# Patient Record
Sex: Female | Born: 1955 | Race: Black or African American | Hispanic: No | Marital: Single | Smoking: Never smoker
Health system: Southern US, Community
[De-identification: ages and names within clinical notes are randomized; demographics above are authoritative.]

## PROBLEM LIST (undated history)

## (undated) DIAGNOSIS — N289 Disorder of kidney and ureter, unspecified: Secondary | ICD-10-CM

## (undated) DIAGNOSIS — E119 Type 2 diabetes mellitus without complications: Secondary | ICD-10-CM

## (undated) DIAGNOSIS — I10 Essential (primary) hypertension: Secondary | ICD-10-CM

## (undated) DIAGNOSIS — F40240 Claustrophobia: Secondary | ICD-10-CM

## (undated) DIAGNOSIS — D509 Iron deficiency anemia, unspecified: Secondary | ICD-10-CM

## (undated) DIAGNOSIS — J45909 Unspecified asthma, uncomplicated: Secondary | ICD-10-CM

## (undated) DIAGNOSIS — D649 Anemia, unspecified: Secondary | ICD-10-CM

## (undated) DIAGNOSIS — D72819 Decreased white blood cell count, unspecified: Secondary | ICD-10-CM

## (undated) DIAGNOSIS — C801 Malignant (primary) neoplasm, unspecified: Secondary | ICD-10-CM

## (undated) DIAGNOSIS — R0600 Dyspnea, unspecified: Secondary | ICD-10-CM

## (undated) DIAGNOSIS — G4730 Sleep apnea, unspecified: Secondary | ICD-10-CM

## (undated) DIAGNOSIS — M1990 Unspecified osteoarthritis, unspecified site: Secondary | ICD-10-CM

## (undated) HISTORY — PX: ABDOMINAL HYSTERECTOMY: SHX81

## (undated) HISTORY — DX: Anemia, unspecified: D64.9

## (undated) HISTORY — PX: CARDIAC SURGERY: SHX584

## (undated) HISTORY — DX: Essential (primary) hypertension: I10

## (undated) HISTORY — DX: Decreased white blood cell count, unspecified: D72.819

## (undated) HISTORY — DX: Claustrophobia: F40.240

## (undated) HISTORY — DX: Malignant (primary) neoplasm, unspecified: C80.1

## (undated) HISTORY — PX: OTHER SURGICAL HISTORY: SHX169

## (undated) HISTORY — DX: Iron deficiency anemia, unspecified: D50.9

## (undated) HISTORY — DX: Unspecified osteoarthritis, unspecified site: M19.90

---

## 2009-03-05 ENCOUNTER — Encounter: Payer: Self-pay | Admitting: Gastroenterology

## 2009-03-10 ENCOUNTER — Encounter: Payer: Self-pay | Admitting: Gastroenterology

## 2009-03-26 ENCOUNTER — Ambulatory Visit (HOSPITAL_COMMUNITY): Admission: RE | Admit: 2009-03-26 | Discharge: 2009-03-26 | Payer: Self-pay | Admitting: Gastroenterology

## 2009-03-26 ENCOUNTER — Ambulatory Visit: Payer: Self-pay | Admitting: Gastroenterology

## 2009-03-26 HISTORY — PX: COLONOSCOPY: SHX174

## 2010-03-08 NOTE — Letter (Signed)
Summary: Internal Other /triage/instructions  Internal Other /triage/instructions   Imported By: Cloria Spring LPN 54/10/8117 14:78:29  _____________________________________________________________________  External Attachment:    Type:   Image     Comment:   External Document

## 2010-03-08 NOTE — Letter (Signed)
Summary: TRIAGE  TRIAGE   Imported By: Diana Eves 03/05/2009 11:27:18  _____________________________________________________________________  External Attachment:    Type:   Image     Comment:   External Document  Appended Document: TRIAGE Osmoprep per pt request. Pt needs to take 1/2 her HCTZ and drink fluids so her urine remains light yellow.  Appended Document: TRIAGE Pt's Rx and instructions mailed.  Appended Document: TRIAGE Reviewed instructions with pt.

## 2012-08-06 ENCOUNTER — Ambulatory Visit: Payer: Self-pay

## 2013-11-18 ENCOUNTER — Ambulatory Visit (INDEPENDENT_AMBULATORY_CARE_PROVIDER_SITE_OTHER): Payer: Self-pay | Admitting: Orthopedic Surgery

## 2013-11-18 ENCOUNTER — Encounter: Payer: Self-pay | Admitting: Orthopedic Surgery

## 2013-11-18 VITALS — BP 119/72 | Ht 63.0 in | Wt 326.0 lb

## 2013-11-18 DIAGNOSIS — M170 Bilateral primary osteoarthritis of knee: Secondary | ICD-10-CM

## 2013-11-18 NOTE — Progress Notes (Signed)
BP 119/72  Ht 5\' 3"  (1.6 m)  Wt 326 lb (147.873 kg)  BMI 57.76 kg/m2

## 2013-11-18 NOTE — Progress Notes (Signed)
Chief Complaint  Patient presents with  . Knee Pain    bilateral knee pain left>right, unknown injury, referred by A. Mancel Bale, [place]    Pharmacy [employer] knee and seasonal Position Asst. Karna Dupes  58 year old female presents with bilateral knee pain with no history of injury. Complains of pain stiffness and difficulty walking. She complains of 8/10 pain which was unrelieved by Tylenol and ibuprofen and unrelieved by tramadol. Recently switched to [place] with a little bit better pain relief. The patient is not tried weight loss, exercise or physical therapy.  Her review of systems is negative except for joint pain and stiff joints. All systems were reviewed.  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 Orientation is normal mood and affect are normal gait is abnormal, she has a decreased stride length.  Mood and affect are normal.  Her upper extremities are well aligned with normal range of motion stability strength skin pulse and sensation  As far as her knees go she has limited flexion to just 90 she has full extension with crepitance there is joint line tenderness bilaterally in any joint line. The knees appear to be stable strength is normal scans intact pulses are normal she has mild edema she has normal sensation there is also no pathologic reflexes and her balance is normal.  She brought x-rays with her they showed grade 4 arthritis with varus osteophyte secondary bone changes from arthritis  She would need knee replacements but her BMI needs to be 40 or less  Our recommendations are for  aggressive weight loss measures to include medication. When her BMI is 40 or less please send her back for consideration for knee replacement

## 2013-11-18 NOTE — Patient Instructions (Signed)
Dr Aline Brochure will send recommendation to Mr Alford Highland

## 2014-08-03 ENCOUNTER — Encounter (HOSPITAL_COMMUNITY): Payer: Self-pay | Admitting: Oncology

## 2014-08-03 ENCOUNTER — Encounter (HOSPITAL_BASED_OUTPATIENT_CLINIC_OR_DEPARTMENT_OTHER): Payer: 59

## 2014-08-03 ENCOUNTER — Encounter (HOSPITAL_COMMUNITY): Payer: 59 | Attending: Oncology | Admitting: Oncology

## 2014-08-03 VITALS — BP 180/81 | HR 87 | Temp 98.0°F | Resp 18 | Ht 60.0 in | Wt 339.0 lb

## 2014-08-03 DIAGNOSIS — D649 Anemia, unspecified: Secondary | ICD-10-CM

## 2014-08-03 DIAGNOSIS — D72819 Decreased white blood cell count, unspecified: Secondary | ICD-10-CM | POA: Diagnosis not present

## 2014-08-03 DIAGNOSIS — D509 Iron deficiency anemia, unspecified: Secondary | ICD-10-CM

## 2014-08-03 HISTORY — DX: Iron deficiency anemia, unspecified: D50.9

## 2014-08-03 HISTORY — DX: Decreased white blood cell count, unspecified: D72.819

## 2014-08-03 LAB — CBC WITH DIFFERENTIAL/PLATELET
BASOS ABS: 0 10*3/uL (ref 0.0–0.1)
BASOS PCT: 0 % (ref 0–1)
Eosinophils Absolute: 0.1 10*3/uL (ref 0.0–0.7)
Eosinophils Relative: 2 % (ref 0–5)
HEMATOCRIT: 29.3 % — AB (ref 36.0–46.0)
Hemoglobin: 8.9 g/dL — ABNORMAL LOW (ref 12.0–15.0)
LYMPHS PCT: 44 % (ref 12–46)
Lymphs Abs: 1.7 10*3/uL (ref 0.7–4.0)
MCH: 24.5 pg — ABNORMAL LOW (ref 26.0–34.0)
MCHC: 30.4 g/dL (ref 30.0–36.0)
MCV: 80.7 fL (ref 78.0–100.0)
MONO ABS: 0.2 10*3/uL (ref 0.1–1.0)
MONOS PCT: 5 % (ref 3–12)
NEUTROS ABS: 1.9 10*3/uL (ref 1.7–7.7)
NEUTROS PCT: 49 % (ref 43–77)
Platelets: 217 10*3/uL (ref 150–400)
RBC: 3.63 MIL/uL — AB (ref 3.87–5.11)
RDW: 19.4 % — AB (ref 11.5–15.5)
WBC: 3.9 10*3/uL — AB (ref 4.0–10.5)

## 2014-08-03 LAB — COMPREHENSIVE METABOLIC PANEL
ALT: 14 U/L (ref 14–54)
ANION GAP: 7 (ref 5–15)
AST: 20 U/L (ref 15–41)
Albumin: 3.7 g/dL (ref 3.5–5.0)
Alkaline Phosphatase: 56 U/L (ref 38–126)
BUN: 20 mg/dL (ref 6–20)
CALCIUM: 9.1 mg/dL (ref 8.9–10.3)
CO2: 27 mmol/L (ref 22–32)
CREATININE: 0.88 mg/dL (ref 0.44–1.00)
Chloride: 101 mmol/L (ref 101–111)
Glucose, Bld: 102 mg/dL — ABNORMAL HIGH (ref 65–99)
Potassium: 3.4 mmol/L — ABNORMAL LOW (ref 3.5–5.1)
Sodium: 135 mmol/L (ref 135–145)
Total Bilirubin: 0.4 mg/dL (ref 0.3–1.2)
Total Protein: 9.5 g/dL — ABNORMAL HIGH (ref 6.5–8.1)

## 2014-08-03 LAB — SAMPLE TO BLOOD BANK

## 2014-08-03 LAB — IRON AND TIBC
IRON: 81 ug/dL (ref 28–170)
Saturation Ratios: 31 % (ref 10.4–31.8)
TIBC: 260 ug/dL (ref 250–450)
UIBC: 179 ug/dL

## 2014-08-03 LAB — RETICULOCYTES
RBC.: 3.63 MIL/uL — AB (ref 3.87–5.11)
RETIC COUNT ABSOLUTE: 29 10*3/uL (ref 19.0–186.0)
Retic Ct Pct: 0.8 % (ref 0.4–3.1)

## 2014-08-03 LAB — FERRITIN: FERRITIN: 179 ng/mL (ref 11–307)

## 2014-08-03 LAB — FOLATE: Folate: 15.6 ng/mL (ref >5.9)

## 2014-08-03 LAB — C-REACTIVE PROTEIN: CRP: 0.9 mg/dL (ref <1.0)

## 2014-08-03 LAB — LACTATE DEHYDROGENASE: LDH: 171 U/L (ref 98–192)

## 2014-08-03 LAB — SEDIMENTATION RATE: SED RATE: 138 mm/h — AB (ref 0–22)

## 2014-08-03 LAB — VITAMIN B12: Vitamin B-12: 300 pg/mL (ref 180–914)

## 2014-08-03 NOTE — Progress Notes (Signed)
Kelli Hurley presented for labwork. Labs per MD order drawn via Peripheral Line 23 gauge needle inserted in right AC.  Good blood return present. Procedure without incident.  Needle removed intact. Patient tolerated procedure well.

## 2014-08-03 NOTE — Assessment & Plan Note (Signed)
Significant anemia, in the 8 g/dL range.  Normocytic, hypochromic in nature.  Labs today: CBC diff, CMET, Stool cards x 3, LDH, Haptoglobin, Retic count, MM Panel, ESR, CRP, UPEP, anemia panel.  Return in 2 weeks for follow-up.  If peripheral work-up is negative, then she will need a bone marrow aspiration and biopsy.

## 2014-08-03 NOTE — Assessment & Plan Note (Signed)
Noted.  CBC diff drawn today.  May be hereditary, but requires work-up with anemia.

## 2014-08-03 NOTE — Progress Notes (Signed)
[facility] Hematology/Oncology Consultation   Name: Kelli Hurley      MRN: [id number]    Location: Room/bed info not found  Date: 08/03/2014 Time:3:23 PM   REFERRING PHYSICIAN:  Dr. Charlotte Sanes  REASON FOR CONSULT:  Anemia, leukopenia, and cryglobulinemia   DIAGNOSIS:  Normocytic (with episodes of microcytic), hypochromic anemia, with minimal leukopenia.  HISTORY OF PRESENT ILLNESS:   [name] is a 59 year old black American woman with a past medical history significant for HTN and obesity who was referred to [facility] for anemia, leukopenia, and cryglobulinemia.  I personally reviewed and went over laboratory results with the patient.  The results are noted within this dictation.  No labs available in [facility].  On review she is noted to have a normocytic, hypochromic anemia in the 8 g/dL range, a minimally low leukopenia, negative stool cards, Folate 11.8, B12 436, low TIBC, ferritin 269, WNL renal function.  No images are available via [facility].  She reports that she is up-to-date on mammograms which are done locally via a mobile testing site.  She reports that she is up-to-date on colonoscopy too, but I do not have this record.   The patient reports that she reported to her primary care provider to "get my knees checked out."  She reports B/L knee pain.  Labs were done at that time demonstrating an anemia.  She reports that her primary care provider started her on TID dosing of iron tablets.  She is tolerating well and she notes an improvement in her clinical wellbeing.    She denies any fatigue, blood in stool, dark stools, hematuria, headaches, dizziness, B symptoms, unintentional weight loss, decreased appetite, signs/symptoms of jaundice, dx of hepatitis, EtOH abuse, cigarette abuse, chest pain, abdominal pain.   PAST MEDICAL HISTORY:   Past Medical History  Diagnosis Date  . Hypertension   . Anemia     ALLERGIES: No Known Allergies    MEDICATIONS: I  have reviewed the patient's current medications.    Current Outpatient Prescriptions on File Prior to Visit  Medication Sig Dispense Refill  . HYDROCHLOROTHIAZIDE PO Take 25 mg by mouth daily.      No current facility-administered medications on file prior to visit.     PAST SURGICAL HISTORY Past Surgical History  Procedure Laterality Date  . Abdominal hysterectomy      FAMILY HISTORY: Family History  Problem Relation Age of Onset  . Cancer Mother   . Hypertension Mother   . Cancer Father   . Hypertension Father   . Cancer Maternal Grandmother     SOCIAL HISTORY: She reports that she used to smoke as a teenager, but quit > 40 years ago.  She denies EtOH abuse.  She denies illicit drug abuse.  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  She live byherself.  She is divorced with two daughters ages 64 and 1 who are healthy.  Her grandbaby is healthy too.  She is Psychologist, forensic.  PERFORMANCE STATUS: The patient's performance status is 1 - Symptomatic but completely ambulatory  PHYSICAL EXAM: Most Recent Vital Signs: Blood pressure 180/81, pulse 87, temperature 98 F (36.7 C), temperature source Oral, resp. rate 18, height 5' (1.524 m), weight 339 lb (153.769 kg), SpO2 100 %. General appearance: alert, cooperative, appears stated age, no distress and morbidly obese Head: Normocephalic, without obvious abnormality, atraumatic Eyes: conjunctivae/corneas clear.  PERRL, EOM's intact. Fundi benign. Throat: lips, mucosa, and tongue normal; teeth and gums normal Neck: no adenopathy, supple, symmetrical, trachea midline and thyroid not enlarged, symmetric, no tenderness/mass/nodules Back: symmetric, no curvature. ROM normal. No CVA tenderness. Lungs: clear to auscultation bilaterally and normal percussion bilaterally Heart: regular rate and rhythm, S1, S2 normal, no murmur, click, rub or gallop Abdomen: soft, non-tender; bowel  sounds normal; no masses,  no organomegaly Extremities: extremities normal, atraumatic, no cyanosis or edema Skin: Skin color, texture, turgor normal. No rashes or lesions Lymph nodes: Cervical, supraclavicular, and axillary nodes normal. Neurologic: Alert and oriented X 3, normal strength and tone. Normal symmetric reflexes. Normal coordination and gait  LABORATORY DATA:  No results found for this or any previous visit (from the past 48 hour(s)).    RADIOGRAPHY: No results found.     PATHOLOGY:  None   ASSESSMENT/PLAN:   Normocytic hypochromic anemia Significant anemia, in the 8 g/dL range.  Normocytic, hypochromic in nature.  Labs today: CBC diff, CMET, Stool cards x 3, LDH, Haptoglobin, Retic count, MM Panel, ESR, CRP, UPEP, anemia panel.  Return in 2 weeks for follow-up.  If peripheral work-up is negative, then she will need a bone marrow aspiration and biopsy.  Leukopenia Noted.  CBC diff drawn today.  May be hereditary, but requires work-up with anemia.   All questions were answered. The patient knows to call the clinic with any problems, questions or concerns. We can certainly see the patient much sooner if necessary.  Patient and plan discussed with Dr. Ancil Linsey and she is in agreement with the aforementioned.   This note is electronically signed by: Doy Mince  08/03/2014 3:23 PM    Patient is seen and examined, I agree with the details as noted above. She presents with a  normocytic anemia and mild leukopenia. She states she has had a GI evaluation and we will try to obtain records regarding this. We discussed options on how to proceed with evaluation of her anemia including a bone marrow biopsy, she would like to proceed first with peripheral evaluation today. Laboratory studies will be drawn as detailed above in addition we will also provide her with stool cards 3. Advised her if her peripheral evaluation is noncontributory I would strongly urge her  to consider bone marrow biopsy.  He does not have underlying chronic kidney disease, nor does she have any known rheumatologic disease. She is post menopausal. I spent time discussing with her the potential causes of anemia and the importance of additional valuation to which she is agreeable. We will see her back within the next week or 2 after laboratory studies are obtained to review the results and make additional recommendations. Molli Hazard, MD

## 2014-08-03 NOTE — Patient Instructions (Addendum)
[place] at [facility] Discharge Instructions  RECOMMENDATIONS MADE BY THE CONSULTANT AND ANY TEST RESULTS WILL BE SENT TO YOUR REFERRING PHYSICIAN.  Exam and discussion today with Kirby Crigler, PA and [name]. Return in 2 weeks for follow-up visit.  Thank you for choosing [name] at [facility] to provide your oncology and hematology care.  To afford each patient quality time with our provider, please arrive at least 15 minutes before your scheduled appointment time.    You need to re-schedule your appointment should you arrive 10 or more minutes late.  We strive to give you quality time with our providers, and arriving late affects you and other patients whose appointments are after yours.  Also, if you no show three or more times for appointments you may be dismissed from the clinic at the providers discretion.     Again, thank you for choosing [facility].  Our hope is that these requests will decrease the amount of time that you wait before being seen by our physicians.       _____________________________________________________________  Should you have questions after your visit to [facility], please contact our office at [phone] between the hours of 8:30 a.m. and 4:30 p.m.  Voicemails left after 4:30 p.m. will not be returned until the following business day.  For prescription refill requests, have your pharmacy contact our office.   24-Hour Urine Collection HOME CARE  When you get up in the morning on the day you do this test, pee (urinate) in the toilet and flush. Make a note of the time. This will be your start time on the day of collection and the end time on the next morning.  From then on, save all your pee (urine) in the plastic jug that was given to you.  You should stop collecting your pee 24 hours after you started.  If the plastic jug that is given to you already has liquid in it, that  is okay. Do not throw out the liquid or rinse out the jug. Some tests need the liquid to be added to your pee.  Keep your plastic jug cool (in an ice chest or the refrigerator) during the test.  When the 24 hours is over, bring your plastic jug to the clinic lab. Keep the jug cool (in an ice chest) while you are bringing it to the lab. Document Released: 04/21/2008 Document Revised: 04/17/2011 Document Reviewed: 04/21/2008 [facility] Patient Information 2015 [place], [place]. This information is not intended to replace advice given to you by your health care provider. Make sure you discuss any questions you have with your health care provider. Fecal Occult Blood Test This is a test done on a stool specimen to screen for gastrointestinal bleeding, which may be an indicator of colon cancer Is is usually done as part of a routine examination, annually, after age 37 or as directed by your caregiver. The fecal occult blood test (FOBT) checks for blood in your stool. Normally, there will not be enough blood lost through the gastrointestinal tract to turn an FOBT positive or for you to notice it visually in the form of bloody or dark, tarry stools. Any significant amount of blood being passed should be investigated.  A positive FOBT will tell your caregiver that you have bleeding occurring somewhere in your gastrointestinal tract. This blood loss could be due to ulcers, diverticulosis, bleeding polyps, inflammatory bowel disease, hemorrhoids, from swallowed  blood due to bleeding gums or nosebleeds, or it could be due to benign or cancerous tumors. Anything that protrudes into the lumen (the empty space in the intestine), like a polyp or tumor, and is rubbed against by the fecal waste as it passes through has the potential to eventually bleed intermittently. Often this small amount of blood is the first, and sometimes the only, symptom of early colon cancer, making the FOBT a valuable screening tool. PREPARATION FOR  TEST  You should not eat red meat within three days before testing. Other substances that could cause a false positive test result include fish, turnips, horseradish, and drugs such as colchicines and oxidizing drugs (for example, iodine and boric acid). Be sure to carefully follow your caregiver's instructions. With FOBT, your caregiver or laboratory will give you one or more test "cards." You collect a separate sample from three different stools, usually on consecutive days. Each stool sample should be collected into a clean container and should not be contaminated with urine or water. The slide is labeled with your name and the date; then, with an applicator stick, you apply a thin smear of stool onto each filter paper square/window contained on the card. Allow the filter paper to dry. Once it is dry, it is stable. Usually you will collect all of the consecutive samples, and then return all of them to your caregiver or laboratory at the same time, sometimes by mailing them. There are also over the counter tests which are dropped in your toilet. NORMAL FINDINGS   No occult blood within the stool.  The FOBT test is normally negative. A positive indicates either blood in the stool or an interfering substance. Multiple samples are done to: 1) catch intermittent bleeding; and 2) help rule out false positives. Ranges for normal findings may vary among different laboratories and hospitals. You should always check with your doctor after having lab work or other tests done to discuss the meaning of your test results and whether your values are considered within normal limits. MEANING OF TEST  Your caregiver will go over the test results with you and discuss the importance and meaning of your results, as well as treatment options and the need for additional tests if necessary. OBTAINING THE TEST RESULTS  It is your responsibility to obtain your test results. Ask the lab or department performing the test when  and how you will get your results. Document Released: 02/18/2004 Document Revised: 04/17/2011 Document Reviewed: 01/03/2008 [facility] Patient Information 2015 [place], [place]. This information is not intended to replace advice given to you by your health care provider. Make sure you discuss any questions you have with your health care provider.

## 2014-08-04 DIAGNOSIS — D509 Iron deficiency anemia, unspecified: Secondary | ICD-10-CM | POA: Diagnosis not present

## 2014-08-04 LAB — KAPPA/LAMBDA LIGHT CHAINS
KAPPA, LAMDA LIGHT CHAIN RATIO: 9.06 — AB (ref 0.26–1.65)
Kappa free light chain: 104.6 mg/L — ABNORMAL HIGH (ref 3.30–19.40)
LAMDA FREE LIGHT CHAINS: 11.54 mg/L (ref 5.71–26.30)

## 2014-08-04 LAB — MULTIPLE MYELOMA PANEL, SERUM
ALBUMIN/GLOB SERPL: 0.8 (ref 0.7–1.7)
ALPHA2 GLOB SERPL ELPH-MCNC: 0.6 g/dL (ref 0.4–1.0)
Albumin SerPl Elph-Mcnc: 3.7 g/dL (ref 2.9–4.4)
Alpha 1: 0.2 g/dL (ref 0.0–0.4)
B-GLOBULIN SERPL ELPH-MCNC: 1 g/dL (ref 0.7–1.3)
GLOBULIN, TOTAL: 4.9 g/dL — AB (ref 2.2–3.9)
Gamma Glob SerPl Elph-Mcnc: 3 g/dL — ABNORMAL HIGH (ref 0.4–1.8)
IgA: 38 mg/dL — ABNORMAL LOW (ref 87–352)
IgG (Immunoglobin G), Serum: 4157 mg/dL — ABNORMAL HIGH (ref 700–1600)
IgM, Serum: 40 mg/dL (ref 26–217)
M Protein SerPl Elph-Mcnc: 2.7 g/dL — ABNORMAL HIGH
TOTAL PROTEIN ELP: 8.6 g/dL — AB (ref 6.0–8.5)

## 2014-08-04 LAB — ERYTHROPOIETIN: ERYTHROPOIETIN: 246.4 m[IU]/mL — AB (ref 2.6–18.5)

## 2014-08-04 LAB — HAPTOGLOBIN: HAPTOGLOBIN: 98 mg/dL (ref 34–200)

## 2014-08-05 ENCOUNTER — Encounter (HOSPITAL_COMMUNITY): Payer: 59

## 2014-08-05 ENCOUNTER — Other Ambulatory Visit (HOSPITAL_COMMUNITY): Payer: Self-pay | Admitting: Oncology

## 2014-08-05 DIAGNOSIS — D649 Anemia, unspecified: Secondary | ICD-10-CM

## 2014-08-05 DIAGNOSIS — D72819 Decreased white blood cell count, unspecified: Secondary | ICD-10-CM

## 2014-08-05 DIAGNOSIS — D509 Iron deficiency anemia, unspecified: Secondary | ICD-10-CM

## 2014-08-06 NOTE — Progress Notes (Signed)
24 HOUR URINE DROPPED OFF, OCCULT CARDS NOT LABELED

## 2014-08-07 ENCOUNTER — Ambulatory Visit (HOSPITAL_COMMUNITY)
Admission: RE | Admit: 2014-08-07 | Discharge: 2014-08-07 | Disposition: A | Discharge status: Home / Self Care | Payer: 59 | Source: Ambulatory Visit | Attending: Oncology | Admitting: Oncology

## 2014-08-07 DIAGNOSIS — D72819 Decreased white blood cell count, unspecified: Secondary | ICD-10-CM | POA: Insufficient documentation

## 2014-08-07 DIAGNOSIS — D509 Iron deficiency anemia, unspecified: Secondary | ICD-10-CM | POA: Diagnosis present

## 2014-08-13 LAB — UIFE/LIGHT CHAINS/TP QN, 24-HR UR
% BETA, Urine: 0 %
ALBUMIN, U: 100 %
ALPHA 1 URINE: 0 %
ALPHA 2 UR: 0 %
FREE KAPPA/LAMBDA RATIO: 5.11 (ref 2.04–10.37)
FREE LAMBDA LT CHAINS, UR: 1.24 mg/L (ref 0.24–6.66)
Free Lt Chn Excr Rate: 6.34 mg/L (ref 1.35–24.19)
GAMMA GLOBULIN URINE: 0 %
TIME-UPE24: 24 h
TOTAL PROTEIN, URINE-UR/DAY: 265.7 mg/(24.h) — AB (ref 30.0–150.0)
Total Protein, Urine: 16.1 mg/dL
VOLUME, URINE-UPE24: 1650 mL

## 2014-08-18 ENCOUNTER — Encounter (HOSPITAL_COMMUNITY): Payer: Self-pay | Admitting: Oncology

## 2014-08-18 ENCOUNTER — Encounter (HOSPITAL_COMMUNITY): Payer: 59 | Attending: Oncology | Admitting: Oncology

## 2014-08-18 VITALS — BP 131/102 | HR 93 | Temp 98.6°F | Resp 18 | Wt 337.0 lb

## 2014-08-18 DIAGNOSIS — D509 Iron deficiency anemia, unspecified: Secondary | ICD-10-CM | POA: Insufficient documentation

## 2014-08-18 DIAGNOSIS — D649 Anemia, unspecified: Secondary | ICD-10-CM | POA: Diagnosis not present

## 2014-08-18 DIAGNOSIS — D72819 Decreased white blood cell count, unspecified: Secondary | ICD-10-CM

## 2014-08-18 NOTE — Assessment & Plan Note (Addendum)
Work-up findings concerning for multiple myeloma.  Labs as noted above.  Radiographic findings as noted above.  She needs a bone marrow aspiration and biopsy.  I reviewed the procedure in detail.  I discussed the risks, benefits, alternatives, and side effects of this intervention.   She is agreeable to this test.  She is interested in having this performed in [place] with IR.  Return in 2-3 weeks for follow-up after BMBX.   Ferritin level is good most recently.  I will not refill PO iron at this time.

## 2014-08-18 NOTE — Progress Notes (Signed)
Kelli Curb, PA-C [address] [place] [address]  Normocytic hypochromic anemia - Plan: CT Biopsy  CURRENT THERAPY: Work-up for anemia  INTERVAL HISTORY: Kelli Hurley 59 y.o. female returns for followup of anemia and leukopenia.  I personally reviewed and went over laboratory results with the patient.  The results are noted within this dictation.  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  I personally reviewed and went over radiographic studies with the patient.  The results are noted within this dictation.  Skeletal survey is negative for any lytic lesions.  With this information, the patient needs a bone marrow aspiration and biopsy.  Past Medical History  Diagnosis Date  . Hypertension   . Anemia   . Normocytic hypochromic anemia 08/03/2014  . Leukopenia 08/03/2014    has Primary osteoarthritis of both knees; Normocytic hypochromic anemia; and Leukopenia on her problem list.     has No Known Allergies.  Current Outpatient Prescriptions on File Prior to Visit  Medication Sig Dispense Refill  . acetaminophen (TYLENOL) 325 MG tablet Take 650 mg by mouth every 6 (six) hours as needed.    . ferrous fumarate (HEMOCYTE - 106 MG FE) 325 (106 FE) MG TABS tablet Take 1 tablet by mouth 3 (three) times daily.    Marland Kitchen HYDROCHLOROTHIAZIDE PO Take 25 mg by mouth daily.      No current facility-administered medications on file prior to visit.    Past Surgical History  Procedure Laterality Date  . Abdominal hysterectomy      Denies any headaches, dizziness, double vision, fevers, chills, night sweats, nausea, vomiting, diarrhea, constipation, chest  pain, heart palpitations, shortness of breath, blood in stool, black tarry stool, urinary pain, urinary burning, urinary frequency, hematuria.   PHYSICAL EXAMINATION  ECOG PERFORMANCE STATUS: 1 - Symptomatic but completely ambulatory  Filed Vitals:   08/18/14 1401  BP: 131/102  Pulse: 93  Temp: 98.6 F (37 C)  Resp: 18    GENERAL:alert, no distress, well nourished, well developed, comfortable, cooperative, obese, smiling and accompanied by sister SKIN: skin color, texture, turgor are normal, no rashes or significant lesions HEAD: Normocephalic, No masses, lesions, tenderness or abnormalities EYES: normal, PERRLA, EOMI, Conjunctiva are pink and non-injected EARS: External ears normal OROPHARYNX:lips, buccal mucosa, and tongue normal and mucous membranes are moist  NECK: supple, trachea midline LYMPH:  not examined BREAST:not examined LUNGS: not examined HEART: not examined ABDOMEN:not examined BACK: Back symmetric, no curvature. EXTREMITIES:less then 2 second capillary refill, no joint deformities, effusion, or inflammation, no skin discoloration, no cyanosis  NEURO: alert & oriented x 3 with fluent speech, no focal motor/sensory deficits, gait normal   LABORATORY DATA: CBC    Component Value Date/Time   WBC 3.9* 08/03/2014 1546   RBC 3.63* 08/03/2014 1546   RBC 3.63* 08/03/2014 1546   HGB 8.9* 08/03/2014 1546   HCT 29.3* 08/03/2014 1546   PLT 217 08/03/2014 1546   MCV 80.7 08/03/2014 1546   MCH 24.5* 08/03/2014 1546   MCHC 30.4 08/03/2014 1546   RDW 19.4* 08/03/2014 1546   LYMPHSABS 1.7 08/03/2014 1546   MONOABS 0.2 08/03/2014 1546   EOSABS 0.1 08/03/2014 1546   BASOSABS 0.0  08/03/2014 1546      Chemistry      Component Value Date/Time   NA 135 08/03/2014 1546   K 3.4* 08/03/2014 1546   CL 101 08/03/2014 1546   CO2 27 08/03/2014 1546   BUN 20 08/03/2014 1546   CREATININE 0.88 08/03/2014 1546      Component Value Date/Time   CALCIUM 9.1 08/03/2014 1546    ALKPHOS 56 08/03/2014 1546   AST 20 08/03/2014 1546   ALT 14 08/03/2014 1546   BILITOT 0.4 08/03/2014 1546      Lab Results  Component Value Date   PROT 9.5* 08/03/2014   IGGSERUM 4157* 08/03/2014   IGA 38* 08/03/2014   IGMSERUM 40 08/03/2014   KPAFRELGTCHN 104.60* 08/03/2014   LAMBDASER 11.54 08/03/2014   KAPLAMBRATIO 9.06* 08/03/2014  ]  Lab Results  Component Value Date   LDH 171 08/03/2014  ] Lab Results  Component Value Date   IRON 81 08/03/2014   TIBC 260 08/03/2014   FERRITIN 179 08/03/2014   Lab Results  Component Value Date   VITAMINB12 300 08/03/2014   Lab Results  Component Value Date   FOLATE 15.6 08/03/2014   Lab Results  Component Value Date   CRP 0.9 08/03/2014   Lab Results  Component Value Date   ESRSEDRATE 138* 08/03/2014   Results for Kelli Hurley (MRN [id number]) as of 08/18/2014 15:33  Ref. Range 08/03/2014 15:46  Haptoglobin Latest Ref Range: 34-200 mg/dL 98   Results for Kelli Hurley (MRN [id number]) as of 08/18/2014 15:33  Ref. Range 08/03/2014 15:46  Erythropoietin Latest Ref Range: 2.6-18.5 mIU/mL 246.4 (H)   PENDING LABS:   RADIOGRAPHIC STUDIES:  Dg Bone Survey Met  08/07/2014   CLINICAL DATA:  Normocytic hypochromic anemia.  Leukopenia.  EXAM: METASTATIC BONE SURVEY  COMPARISON:  None.  FINDINGS: Multilevel degenerative disc disease is noted in the cervical and thoracic spine. Severe degenerative disc disease is noted in both knees. No areas of abnormal sclerosis or lytic destruction are seen in the visualized skeleton.  IMPRESSION: No lytic lesions are noted in the visualized skeleton.   Electronically Signed   By: Kelli Hurley, M.D.   On: 08/07/2014 12:33     PATHOLOGY:    ASSESSMENT AND PLAN:  Normocytic hypochromic anemia Work-up findings concerning for multiple myeloma.  Labs as noted above.  Radiographic findings as noted above.  She needs a bone marrow aspiration and biopsy.  I reviewed the procedure in  detail.  I discussed the risks, benefits, alternatives, and side effects of this intervention.   She is agreeable to this test.  She is interested in having this performed in [place] with IR.  Return in 2-3 weeks for follow-up after BMBX.   Ferritin level is good most recently.  I will not refill PO iron at this time.    THERAPY PLAN:  Bone marrow aspiration and biopsy by IR.  All questions were answered. The patient knows to call the clinic with any problems, questions or concerns. We can certainly see the patient much sooner if necessary.  Patient and plan discussed with Dr. Ancil Linsey and she is in agreement with the aforementioned.   This note is electronically signed by: Doy Mince 08/18/2014 3:37 PM

## 2014-08-18 NOTE — Patient Instructions (Signed)
[place] at [facility] Discharge Instructions  RECOMMENDATIONS MADE BY THE CONSULTANT AND ANY TEST RESULTS WILL BE SENT TO YOUR REFERRING PHYSICIAN.  Exam and discussion today with Kirby Crigler, PA-C. Bone marrow aspiration and biopsy in Interventional Radiology. Return in 2-3 weeks for office visit with [name].  Thank you for choosing [place] at [facility] to provide your oncology and hematology care.  To afford each patient quality time with our provider, please arrive at least 15 minutes before your scheduled appointment time.    You need to re-schedule your appointment should you arrive 10 or more minutes late.  We strive to give you quality time with our providers, and arriving late affects you and other patients whose appointments are after yours.  Also, if you no show three or more times for appointments you may be dismissed from the clinic at the providers discretion.     Again, thank you for choosing [facility].  Our hope is that these requests will decrease the amount of time that you wait before being seen by our physicians.       _____________________________________________________________  Should you have questions after your visit to [facility], please contact our office at [phone] between the hours of 8:30 a.m. and 4:30 p.m.  Voicemails left after 4:30 p.m. will not be returned until the following business day.  For prescription refill requests, have your pharmacy contact our office.

## 2014-08-19 ENCOUNTER — Other Ambulatory Visit: Payer: Self-pay | Admitting: Radiology

## 2014-08-21 ENCOUNTER — Ambulatory Visit (HOSPITAL_COMMUNITY): Payer: 59

## 2014-08-24 ENCOUNTER — Ambulatory Visit (HOSPITAL_COMMUNITY): Payer: 59

## 2014-08-24 ENCOUNTER — Other Ambulatory Visit (HOSPITAL_COMMUNITY): Payer: 59

## 2014-09-02 ENCOUNTER — Other Ambulatory Visit: Payer: Self-pay | Admitting: Radiology

## 2014-09-03 ENCOUNTER — Ambulatory Visit (HOSPITAL_COMMUNITY)
Admission: RE | Admit: 2014-09-03 | Discharge: 2014-09-03 | Disposition: A | Discharge status: Home / Self Care | Payer: 59 | Source: Ambulatory Visit | Attending: Oncology | Admitting: Oncology

## 2014-09-03 ENCOUNTER — Encounter (HOSPITAL_COMMUNITY): Payer: Self-pay

## 2014-09-03 ENCOUNTER — Other Ambulatory Visit: Payer: Self-pay | Admitting: Physician Assistant

## 2014-09-03 DIAGNOSIS — D649 Anemia, unspecified: Secondary | ICD-10-CM | POA: Diagnosis present

## 2014-09-03 DIAGNOSIS — D509 Iron deficiency anemia, unspecified: Secondary | ICD-10-CM

## 2014-09-03 LAB — CBC
HCT: 30.8 % — ABNORMAL LOW (ref 36.0–46.0)
Hemoglobin: 9.4 g/dL — ABNORMAL LOW (ref 12.0–15.0)
MCH: 24.4 pg — ABNORMAL LOW (ref 26.0–34.0)
MCHC: 30.5 g/dL (ref 30.0–36.0)
MCV: 79.8 fL (ref 78.0–100.0)
Platelets: 186 10*3/uL (ref 150–400)
RBC: 3.86 MIL/uL — AB (ref 3.87–5.11)
RDW: 19 % — ABNORMAL HIGH (ref 11.5–15.5)
WBC: 3.4 10*3/uL — AB (ref 4.0–10.5)

## 2014-09-03 LAB — PROTIME-INR
INR: 1.05 (ref 0.00–1.49)
Prothrombin Time: 13.9 seconds (ref 11.6–15.2)

## 2014-09-03 LAB — BONE MARROW EXAM

## 2014-09-03 LAB — APTT: aPTT: 22 seconds — ABNORMAL LOW (ref 24–37)

## 2014-09-03 MED ORDER — FENTANYL CITRATE (PF) 100 MCG/2ML IJ SOLN
INTRAMUSCULAR | Status: AC | PRN
  Administered 2014-09-03: 50 ug via INTRAVENOUS
  Administered 2014-09-03: 25 ug via INTRAVENOUS

## 2014-09-03 MED ORDER — FENTANYL CITRATE (PF) 100 MCG/2ML IJ SOLN
INTRAMUSCULAR | Status: AC
  Filled 2014-09-03: qty 4

## 2014-09-03 MED ORDER — SODIUM CHLORIDE 0.9 % IV SOLN
Freq: Once | INTRAVENOUS | Status: AC
  Administered 2014-09-03: 10:00:00 via INTRAVENOUS

## 2014-09-03 MED ORDER — MIDAZOLAM HCL 2 MG/2ML IJ SOLN
INTRAMUSCULAR | Status: AC | PRN
  Administered 2014-09-03 (×6): 1 mg via INTRAVENOUS

## 2014-09-03 MED ORDER — MIDAZOLAM HCL 2 MG/2ML IJ SOLN
INTRAMUSCULAR | Status: AC
  Filled 2014-09-03: qty 6

## 2014-09-03 MED ORDER — MIDAZOLAM HCL 2 MG/2ML IJ SOLN
INTRAMUSCULAR | Status: AC
  Filled 2014-09-03: qty 2

## 2014-09-03 NOTE — H&P (Signed)
Chief Complaint: Patient was seen in consultation today for CT guided bone marrow biopsy at the request of [place] S  Referring Physician(s): Kefalas,Thomas S  History of Present Illness: Kelli Hurley is a 59 y.o. female with history of anemia, leukopenia, and abnormal serum protein electrophoresis who presents today for CT guided bone marrow biopsy for further evaluation.   Past Medical History  Diagnosis Date  . Hypertension   . Anemia   . Normocytic hypochromic anemia 08/03/2014  . Leukopenia 08/03/2014    Past Surgical History  Procedure Laterality Date  . Abdominal hysterectomy    . 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  Medications: Prior to Admission medications   Medication Sig Start Date End Date Taking? Authorizing Provider  acetaminophen (TYLENOL) 325 MG tablet Take 650 mg by mouth every 6 (six) hours as needed for mild pain.    Yes Historical Provider, MD  hydrochlorothiazide (HYDRODIURIL) 25 MG tablet Take 25 mg by mouth every morning.   Yes Historical Provider, MD     Family History  Problem Relation Age of Onset  . Cancer Mother   . Hypertension Mother   . Cancer Father   . Hypertension Father   . Cancer Maternal Grandmother     History   Social History  . Marital Status: Legally Separated    Spouse Name: N/A  . Number of Children: N/A  . Years of Education: N/A   Social History Main Topics  . Smoking status: Never Smoker   . Smokeless tobacco: Not on file  . Alcohol Use: No  . Drug Use: No  . Sexual Activity: Not on file   Other Topics Concern  . None   Social History Narrative       Review of Systems  Constitutional: Negative for fever and chills.  Respiratory: Negative for cough.        Some dyspnea with exertion  Cardiovascular: Negative for chest pain.  Gastrointestinal: Negative for nausea,  vomiting, abdominal pain and blood in stool.  Genitourinary: Negative for dysuria and hematuria.  Musculoskeletal: Negative for back pain.  Neurological: Negative for headaches.    Vital Signs: BP 131/74 mmHg  Pulse 86  Temp(Src) 98.6 F (37 C) (Oral)  Resp 16  Ht 5' 2"  (1.575 m)  Wt 335 lb (151.955 kg)  BMI 61.26 kg/m2  SpO2 97%  Physical Exam  Constitutional: She is oriented to person, place, and time. She appears well-developed and well-nourished.  Cardiovascular: Normal rate and regular rhythm.   Pulmonary/Chest: Effort normal.  Sl dim BS bases  Abdominal: Soft. Bowel sounds are normal. There is no tenderness.  obese  Musculoskeletal: Normal range of motion.  Neurological: She is alert and oriented to person, place, and time.    Mallampati Score:     Imaging: Dg Bone Survey Met  08/07/2014   CLINICAL DATA:  Normocytic hypochromic anemia.  Leukopenia.  EXAM: METASTATIC BONE SURVEY  COMPARISON:  None.  FINDINGS: Multilevel degenerative disc disease is noted in the cervical and thoracic spine. Severe degenerative disc disease is noted in both knees. No areas of abnormal sclerosis or lytic destruction are seen in the visualized skeleton.  IMPRESSION: No lytic lesions are noted in the visualized skeleton.   Electronically Signed   By: Marijo Conception, M.D.   On: 08/07/2014 12:33    Labs:  CBC:  Recent Labs  08/03/14 1546  WBC 3.9*  HGB 8.9*  HCT 29.3*  PLT 217    COAGS: No results for input(s): INR, APTT in the last 8760 hours.  BMP:  Recent Labs  08/03/14 1546  NA 135  K 3.4*  CL 101  CO2 27  GLUCOSE 102*  BUN 20  CALCIUM 9.1  CREATININE 0.88  GFRNONAA >60  GFRAA >60    LIVER FUNCTION TESTS:  Recent Labs  08/03/14 1546  BILITOT 0.4  AST 20  ALT 14  ALKPHOS 56  PROT 9.5*  ALBUMIN 3.7    TUMOR MARKERS: No results for input(s): AFPTM, CEA, CA199, CHROMGRNA in the last 8760 hours.  Assessment and Plan: Kelli Hurley is a 59 y.o.  female with history of anemia, leukopenia, and abnormal serum protein electrophoresis who presents today for CT guided bone marrow biopsy for further evaluation.Risks and benefits discussed with the patient/family including, but not limited to bleeding, infection, damage to adjacent structures or low yield requiring additional tests. All of the patient's questions were answered, patient is agreeable to proceed.Consent signed and in chart.      Signed: D. Rowe Robert 09/03/2014, 9:37 AM   I spent a total of 15 minutes in face to face in clinical consultation, greater than 50% of which was counseling/coordinating care for CT guided bone marrow biopsy

## 2014-09-03 NOTE — Discharge Instructions (Signed)
Bone Marrow Aspiration, Bone Marrow Biopsy °Care After °Read the instructions outlined below and refer to this sheet in the next few weeks. These discharge instructions provide you with general information on caring for yourself after you leave the hospital. Your caregiver may also give you specific instructions. While your treatment has been planned according to the most current medical practices available, unavoidable complications occasionally occur. If you have any problems or questions after discharge, call your caregiver. °FINDING OUT THE RESULTS OF YOUR TEST °Not all test results are available during your visit. If your test results are not back during the visit, make an appointment with your caregiver to find out the results. Do not assume everything is normal if you have not heard from your caregiver or the medical facility. It is important for you to follow up on all of your test results.  °HOME CARE INSTRUCTIONS  °You have had sedation and may be sleepy or dizzy. Your thinking may not be as clear as usual. For the next 24 hours: °· Only take over-the-counter or prescription medicines for pain, discomfort, and or fever as directed by your caregiver. °· Do not drink alcohol. °· Do not smoke. °· Do not drive. °· Do not make important legal decisions. °· Do not operate heavy machinery. °· Do not care for small children by yourself. °· Keep your dressing clean and dry. You may replace dressing with a bandage after 24 hours. °· You may take a bath or shower after 24 hours. °· Use an ice pack for 20 minutes every 2 hours while awake for pain as needed. °SEEK MEDICAL CARE IF:  °· There is redness, swelling, or increasing pain at the biopsy site. °· There is pus coming from the biopsy site. °· There is drainage from a biopsy site lasting longer than one day. °· An unexplained oral temperature above 102° F (38.9° C) develops. °SEEK IMMEDIATE MEDICAL CARE IF:  °· You develop a rash. °· You have difficulty  breathing. °· You develop any reaction or side effects to medications given. °Document Released: 08/12/2004 Document Revised: 04/17/2011 Document Reviewed: 01/21/2008 °ExitCare® Patient Information ©2015 [employer]. This information is not intended to replace advice given to you by your health care provider. Make sure you discuss any questions you have with your health care provider. °Conscious Sedation °Sedation is the use of medicines to promote relaxation and relieve discomfort and anxiety. Conscious sedation is a type of sedation. Under conscious sedation you are less alert than normal but are still able to respond to instructions or stimulation. Conscious sedation is used during short medical and dental procedures. It is milder than deep sedation or general anesthesia and allows you to return to your regular activities sooner.  °LET YOUR HEALTH CARE PROVIDER KNOW ABOUT:  °· Any allergies you have. °· All medicines you are taking, including vitamins, herbs, eye drops, creams, and over-the-counter medicines. °· Use of steroids (by mouth or creams). °· Previous problems you or members of your family have had with the use of anesthetics. °· Any blood disorders you have. °· Previous surgeries you have had. °· Medical conditions you have. °· Possibility of pregnancy, if this applies. °· Use of cigarettes, alcohol, or illegal drugs. °RISKS AND COMPLICATIONS °Generally, this is a safe procedure. However, as with any procedure, problems can occur. Possible problems include: °· Oversedation. °· Trouble breathing on your own. You may need to have a breathing tube until you are awake and breathing on your own. °· Allergic reaction   to any of the medicines used for the procedure. °BEFORE THE PROCEDURE °· You may have blood tests done. These tests can help show how well your kidneys and liver are working. They can also show how well your blood clots. °· A physical exam will be done.   °· Only take medicines as directed by  your health care provider. You may need to stop taking medicines (such as blood thinners, aspirin, or nonsteroidal anti-inflammatory drugs) before the procedure.   °· Do not eat or drink at least 6 hours before the procedure or as directed by your health care provider. °· Arrange for a responsible adult, family member, or friend to take you home after the procedure. He or she should stay with you for at least 24 hours after the procedure, until the medicine has worn off. °PROCEDURE  °· An intravenous (IV) catheter will be inserted into one of your veins. Medicine will be able to flow directly into your body through this catheter. You may be given medicine through this tube to help prevent pain and help you relax. °· The medical or dental procedure will be done. °AFTER THE PROCEDURE °· You will stay in a recovery area until the medicine has worn off. Your blood pressure and pulse will be checked.   °·  Depending on the procedure you had, you may be allowed to go home when you can tolerate liquids and your pain is under control. °Document Released: 10/18/2000 Document Revised: 01/28/2013 Document Reviewed: 09/30/2012 °ExitCare® Patient Information ©2015 [employer]. This information is not intended to replace advice given to you by your health care provider. Make sure you discuss any questions you have with your health care provider. °Conscious Sedation, Adult, Care After °Refer to this sheet in the next few weeks. These instructions provide you with information on caring for yourself after your procedure. Your health care provider may also give you more specific instructions. Your treatment has been planned according to current medical practices, but problems sometimes occur. Call your health care provider if you have any problems or questions after your procedure. °WHAT TO EXPECT AFTER THE PROCEDURE  °After your procedure: °· You may feel sleepy, clumsy, and have poor balance for several hours. °· Vomiting may  occur if you eat too soon after the procedure. °HOME CARE INSTRUCTIONS °· Do not participate in any activities where you could become injured for at least 24 hours. Do not: °¨ Drive. °¨ Swim. °¨ Ride a bicycle. °¨ Operate heavy machinery. °¨ Cook. °¨ Use power tools. °¨ Climb ladders. °¨ Work from a high place. °· Do not make important decisions or sign legal documents until you are improved. °· If you vomit, drink water, juice, or soup when you can drink without vomiting. Make sure you have little or no nausea before eating solid foods. °· Only take over-the-counter or prescription medicines for pain, discomfort, or fever as directed by your health care provider. °· Make sure you and your family fully understand everything about the medicines given to you, including what side effects may occur. °· You should not drink alcohol, take sleeping pills, or take medicines that cause drowsiness for at least 24 hours. °· If you smoke, do not smoke without supervision. °· If you are feeling better, you may resume normal activities 24 hours after you were sedated. °· Keep all appointments with your health care provider. °SEEK MEDICAL CARE IF: °· Your skin is pale or bluish in color. °· You continue to feel nauseous or vomit. °· Your pain   is getting worse and is not helped by medicine. °· You have bleeding or swelling. °· You are still sleepy or feeling clumsy after 24 hours. °SEEK IMMEDIATE MEDICAL CARE IF: °· You develop a rash. °· You have difficulty breathing. °· You develop any type of allergic problem. °· You have a fever. °MAKE SURE YOU: °· Understand these instructions. °· Will watch your condition. °· Will get help right away if you are not doing well or get worse. °Document Released: 11/13/2012 Document Reviewed: 11/13/2012 °ExitCare® Patient Information ©2015 [employer]. This information is not intended to replace advice given to you by your health care provider. Make sure you discuss any questions you have with  your health care provider. ° °

## 2014-09-03 NOTE — Procedures (Signed)
Interventional Radiology Procedure Note  Procedure: CT guided aspirate and core biopsy of right iliac bone Complications: None Recommendations: - Bedrest supine x 1 hrs - OTC for any post op pain - Follow biopsy results  Signed,  Dulcy Fanny. Earleen Newport, DO

## 2014-09-08 ENCOUNTER — Ambulatory Visit (HOSPITAL_COMMUNITY): Payer: 59 | Admitting: Hematology & Oncology

## 2014-09-14 LAB — TISSUE HYBRIDIZATION (BONE MARROW)-NCBH

## 2014-09-14 LAB — CHROMOSOME ANALYSIS, BONE MARROW

## 2014-09-15 ENCOUNTER — Encounter (HOSPITAL_COMMUNITY): Payer: 59 | Attending: Oncology | Admitting: Hematology & Oncology

## 2014-09-15 VITALS — BP 132/65 | HR 81 | Temp 98.0°F | Resp 18 | Wt 339.6 lb

## 2014-09-15 DIAGNOSIS — C9000 Multiple myeloma not having achieved remission: Secondary | ICD-10-CM

## 2014-09-15 DIAGNOSIS — D509 Iron deficiency anemia, unspecified: Secondary | ICD-10-CM | POA: Insufficient documentation

## 2014-09-15 DIAGNOSIS — D72819 Decreased white blood cell count, unspecified: Secondary | ICD-10-CM | POA: Insufficient documentation

## 2014-09-15 NOTE — Patient Instructions (Addendum)
[place] at [facility] Discharge Instructions  RECOMMENDATIONS MADE BY THE CONSULTANT AND ANY TEST RESULTS WILL BE SENT TO YOUR REFERRING PHYSICIAN.  We will send you to [facility] to see a Multiple Myeloma specialist there also. We are doing this because you are young and eventually we want you to have an auto-bone marrow transplant (your own bone marrow). The transplant will have to be done at [facility] instead of [facility].  Your chemo treatments will be done here @ [place]. Treatments will last between 4-6 months.   If [facility] decides to do a transplant, you will stay in the hospital for 2-3 weeks (@ [facility]).   This cancer is more prevalent in the African American population.  The care for this cancer is very standardized.   You will start with induction therapy. Hildred Alamin will do your teaching. Kiaria Quinnell's # [id number]  After transplant, some patients take a pill. We will determine this once we get closer to that time.  MM cells live in the bones and can eat holes in the bone. We will order a PET scan to be sure that we don't see any cancer in the bones.   We will have you do a urine test. You will collect your urine for a 24 hour period and then bring it back to [place] and we will test your urine for a M-protein.  These plasma cells constantly make M proteins and this is what we are checking for in your urine.       Thank you for choosing [place] at [facility] to provide your oncology and hematology care.  To afford each patient quality time with our provider, please arrive at least 15 minutes before your scheduled appointment time.    You need to re-schedule your appointment should you arrive 10 or more minutes late.  We strive to give you quality time with our providers, and arriving late affects you and other patients whose appointments are after yours.  Also, if you no show three or more times for appointments you may be dismissed  from the clinic at the providers discretion.     Again, thank you for choosing [facility].  Our hope is that these requests will decrease the amount of time that you wait before being seen by our physicians.       _____________________________________________________________  Should you have questions after your visit to [facility], please contact our office at [phone] between the hours of 8:30 a.m. and 4:30 p.m.  Voicemails left after 4:30 p.m. will not be returned until the following business day.  For prescription refill requests, have your pharmacy contact our office.   Multiple Myeloma Multiple myeloma is the most common cancer of bone. It is caused by the uncontrolled multiplication of a type of white blood cell in the marrow. This white blood cell is called a plasma cell. This means the bone marrow is overworking producing plasma cells. Soon these overproduced cells begin to take up room in the marrow that is needed by other cells. This means that there are soon not enough red or white blood cells or platelets. Not enough red cells mean that the person is anemic. There are not enough red blood cells to carry oxygen around the body. There are not enough white blood cells to fight disease. This causes the person with multiple myeloma to not feel well. There is also bone pain through  much of the body. SYMPTOMS  Anemia causes fatigue (tiredness) and weakness.  Back pain is common. This is from fractures (break in bones) caused by damage to the bones of the back.  Lack of white blood cells makes infection more likely.  Bleeding is a common problem from lack of the cells (platelets). Platelets help blood clots form. This may show up as bleeding from any place. Commonly this shows up as bleeding from the nose or gums.  Fractures (bone breaks) are more common anywhere. The back and ribs are the most commonly fractured areas. DIAGNOSIS  This tumor is often  suggested by blood tests. Often doing a bone marrow sample makes the diagnosis (learning what is wrong). This is a test performed by taking a small sample of bone with a small needle. This bone often comes from the sternum (breast bone). This sample is sent to a pathologist (a specialist in looking at tissue under a microscope). After looking at the sample under the microscope, the pathologist is able to make a diagnosis of the problem. X-rays may also show boney changes. TREATMENT   Occasionally, anti-cancer medications may be used with multiple myeloma. Your caregiver can discuss this with you.  Medications can also be given to help with the bone pain.  There is no cure for multiple myeloma. Lifestyle changes can add years of quality living. HOME CARE INSTRUCTIONS  Often there is no specific treatment for multiple myeloma. Most of the treatment consists of adjustments in dietary and living activities. Some of these changes include:  Your dietitian or caregiver helping you with your dietary questions.  Taking iron and vitamins as prescribed by your caregiver.  Eating a well balanced diet.  Staying active, but follow restrictions suggested by your caregiver. Avoiding heavy lifting (more than 10 pounds) and activities that cause increased pain.  Drinking plenty of water.  Using back braces and a cane may help with some of the boney pain. SEEK IMMEDIATE MEDICAL CARE IF:  You develop severe, uncontrolled boney pain.  You or your family notices confusion, problems with decision-making or inability to stay awake.  You notice increased urination or constipation.  You notice problems holding your water or stool.  You have numbness or loss of control of your extremities (arms/hands or legs/feet). Document Released: 10/18/2000 Document Revised: 04/17/2011 Document Reviewed: 01/19/2008 [facility] Patient Information 2015 [place], [place]. This information is not intended to replace advice given  to you by your health care provider. Make sure you discuss any questions you have with your health care provider. 24-Hour Urine Collection HOME CARE  When you get up in the morning on the day you do this test, pee (urinate) in the toilet and flush. Make a note of the time. This will be your start time on the day of collection and the end time on the next morning.  From then on, save all your pee (urine) in the plastic jug that was given to you.  You should stop collecting your pee 24 hours after you started.  If the plastic jug that is given to you already has liquid in it, that is okay. Do not throw out the liquid or rinse out the jug. Some tests need the liquid to be added to your pee.  Keep your plastic jug cool (in an ice chest or the refrigerator) during the test.  When the 24 hours is over, bring your plastic jug to the clinic lab. Keep the jug cool (in an ice chest) while you  are bringing it to the lab. Document Released: 04/21/2008 Document Revised: 04/17/2011 Document Reviewed: 04/21/2008 [facility] Patient Information 2015 [place], [place]. This information is not intended to replace advice given to you by your health care provider. Make sure you discuss any questions you have with your health care provider. Positron Emission Tomography (PET Scan) PET stands for positron emission tomography. This is a test similar to an X-ray. Pictures can be taken of a body part after injection of a very small dose of a chemical called a radionuclide. This is combined with sugar, water, or ammonia to give off tiny particles called positrons. The positrons emitted are like small bursts of energy that can be detected by a scanner. They are processed by a computer to create images. These images can be used to study different diseases. They are often used to study cancer and cancer therapy. A scan of the entire body can be done and used to study all its parts. Because this test is tagged to a sugar used by  cells, the bursts of energy show up differently in cells that use sugar faster. The computer is able to produce a color-coded picture based on this. The colors and amount of brightness on a PET image show different levels of tissue or organ function. For example, a cancer grows faster than healthy tissue and uses more sugar than normal tissue. It will absorb more of the substance injected. This causes it to appear brighter than normal tissue on the PET image. A specialist will read and explain the images. Other examinations, such as recent CT (or CAT) scans or MRI scans may help with interpretation and should be brought along. There are usually no restrictions after the test. You should drink plenty of fluids to flush the radioactive substance from your body. BEFORE THE PROCEDURE   PET is usually an outpatient procedure. Wear comfortable, loose-fitting clothes.  Do not eat for four hours before the scan. You will be encouraged to drink water.  Your caregiver will instruct you regarding the use of medications before the test.  Note: Diabetic patients should ask for any specific diet guidelines to control glucose (sugar) levels during the day of the test. There are limitations with the test if your blood sugar is not controlled during or before the test.  Be on time because of the rapid decay of the radioactive material that must be injected. PROCEDURE  Before the procedure begins a small amount of harmless radioactive material will be injected into a vein. This means you will have a needle stick. It will take from 30 minutes to one hour for the material to travel around your body in preparation for the scan. You will lie on a cushioned table and be moved through the center of a machine that looks like a large doughnut. This is the machine that detects the positrons. It is connected to a computer that produces images that can be viewed on a monitor. This will take about 30 minutes to an hour, during  which you must remain still. Let your caregiver know if this will be difficult for you. Also, let your caregiver know if you need a sedative or help dealing with claustrophobia (feeling uncomfortable in enclosed spaces). HOME CARE INSTRUCTIONS   For the protection of your privacy, test results can not be given over the phone. Make sure you receive the results of your test. Ask as to how these results are to be obtained if you have not been informed. It is  your responsibility to obtain your test results.  Drink several 8-once glasses of water following the test to flush the small amount of radioactive material out of your body.  Keep your follow-up appointments. Document Released: 07/30/2002 Document Revised: 04/17/2011 Document Reviewed: 05/07/2013 [facility] Patient Information 2015 [place], [place]. This information is not intended to replace advice given to you by your health care provider. Make sure you discuss any questions you have with your health care provider.

## 2014-09-15 NOTE — Progress Notes (Signed)
[facility] Hematology/Oncology Progress Note   Name: Kelli Hurley      MRN: [id number]    Date: 10/17/2014 Time:7:51 PM   REFERRING PHYSICIAN:  Dr. Charlotte Sanes  REASON FOR CONSULT:  Anemia, leukopenia, and cryglobulinemia   DIAGNOSIS:  Normocytic (with episodes of microcytic), hypochromic anemia, with minimal leukopenia.  HISTORY OF PRESENT ILLNESS:   Kelli Hurley is a 59 year old black American woman with a past medical history significant for HTN and obesity who was referred to [facility] for anemia, leukopenia, and cryglobulinemia. On review she is noted to have a normocytic, hypochromic anemia in the 8 g/dL range, a minimally low leukopenia, negative stool cards, Folate 11.8, B12 436, low TIBC, ferritin 269, WNL renal function.  We did additional peripheral anemia studies which showed the presence of an M spike and bone marrow biopsy was pursued.  She is here today with 2 family members to review her bone marrow biopsy.  She denies any problems at the site of the procedure. She denies any other major complaints other than fatigue.    PAST MEDICAL HISTORY:   Past Medical History  Diagnosis Date  . Hypertension   . Anemia   . Normocytic hypochromic anemia 08/03/2014  . Leukopenia 08/03/2014  . Claustrophobia 10/05/2014    ALLERGIES: No Known Allergies    MEDICATIONS: I have reviewed the patient's current medications.    Current Outpatient Prescriptions on File Prior to Visit  Medication Sig Dispense Refill  . hydrochlorothiazide (HYDRODIURIL) 25 MG tablet Take 25 mg by mouth every morning.     No current facility-administered medications on file prior to visit.     PAST SURGICAL HISTORY Past Surgical History  Procedure Laterality Date  . Abdominal hysterectomy    . Other surgical history      heart surgery as infant to "repair hole in heart"    FAMILY HISTORY: Family History  Problem Relation Age of Onset  . Cancer Mother   .  Hypertension Mother   . Cancer Father   . Hypertension Father   . Cancer Maternal Grandmother     SOCIAL HISTORY: She reports that she used to smoke as a teenager, but quit > 40 years ago.  She denies EtOH abuse.  She denies illicit drug abuse.  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  She live byherself.  She is divorced with two daughters ages 62 and 58 who are healthy.  Her grandbaby is healthy too.  She is Psychologist, forensic.  PERFORMANCE STATUS: The patient's performance status is 1 - Symptomatic but completely ambulatory  PHYSICAL EXAM: Most Recent Vital Signs: Blood pressure 132/65, pulse 81, temperature 98 F (36.7 C), temperature source Oral, resp. rate 18, weight 339 lb 9.6 oz (154.042 kg). General appearance: alert, cooperative, appears stated age, no distress and morbidly obese Head: Normocephalic, without obvious abnormality, atraumatic Eyes: conjunctivae/corneas clear. PERRL, EOM's intact. Fundi benign. Throat: lips, mucosa, and tongue normal; teeth and gums normal Neck: no adenopathy, supple, symmetrical, trachea midline and thyroid not enlarged, symmetric, no tenderness/mass/nodules Back: symmetric, no curvature. ROM normal. No CVA tenderness. Lungs: clear to auscultation bilaterally and normal percussion bilaterally Heart: regular rate and rhythm, S1, S2 normal, no murmur, click, rub or gallop Abdomen: soft, non-tender; bowel sounds normal; no masses,  no organomegaly Extremities: extremities normal, atraumatic, no cyanosis or edema Skin: Skin color, texture, turgor normal. No  rashes or lesions Lymph nodes: Cervical, supraclavicular, and axillary nodes normal. Neurologic: Alert and oriented X 3, normal strength and tone. Normal symmetric reflexes. Normal coordination and gait  LABORATORY DATA:  Results for Kelli Hurley, Kelli Hurley (MRN [id number])   Ref. Range 08/03/2014 15:46  Sodium Latest Ref Range: 135-145  mmol/L 135  Potassium Latest Ref Range: 3.5-5.1 mmol/L 3.4 (L)  Chloride Latest Ref Range: 101-111 mmol/L 101  CO2 Latest Ref Range: 22-32 mmol/L 27  BUN Latest Ref Range: 6-20 mg/dL 20  Creatinine Latest Ref Range: 0.44-1.00 mg/dL 0.88  Calcium Latest Ref Range: 8.9-10.3 mg/dL 9.1  EGFR (Non-African Amer.) Latest Ref Range: >60 mL/min >60  EGFR (African American) Latest Ref Range: >60 mL/min >60  Glucose Latest Ref Range: 65-99 mg/dL 102 (H)  Anion gap Latest Ref Range: 5-15  7  Alkaline Phosphatase Latest Ref Range: 38-126 U/L 56  Albumin Latest Ref Range: 3.5-5.0 g/dL 3.7  AST Latest Ref Range: 15-41 U/L 20  ALT Latest Ref Range: 14-54 U/L 14  Total Protein Latest Ref Range: 6.5-8.1 g/dL 9.5 (H)  Total Bilirubin Latest Ref Range: 0.3-1.2 mg/dL 0.4  LDH Latest Ref Range: 98-192 U/L 171  Iron Latest Ref Range: 28-170 ug/dL 81  UIBC Latest Units: ug/dL 179  TIBC Latest Ref Range: 250-450 ug/dL 260  Saturation Ratios Latest Ref Range: 10.4-31.8 % 31  Ferritin Latest Ref Range: 11-307 ng/mL 179  Folate Latest Ref Range: >5.9 ng/mL 15.6  CRP Latest Ref Range: <1.0 mg/dL 0.9  Vitamin B-12 Latest Ref Range: 180-914 pg/mL 300  Total Protein ELP Latest Ref Range: 6.0-8.5 g/dL 8.6 (H)  Albumin SerPl Elph-Mcnc Latest Ref Range: 2.9-4.4 g/dL 3.7  Albumin/Glob SerPl Latest Ref Range: 0.7-1.7  0.8  Alpha2 Glob SerPl Elph-Mcnc Latest Ref Range: 0.4-1.0 g/dL 0.6  Alpha 1 Latest Ref Range: 0.0-0.4 g/dL 0.2  Gamma Glob SerPl Elph-Mcnc Latest Ref Range: 0.4-1.8 g/dL 3.0 (H)  IFE 1 Unknown Comment  Globulin, Total Latest Ref Range: 2.2-3.9 g/dL 4.9 (H)  B-Globulin SerPl Elph-Mcnc Latest Ref Range: 0.7-1.3 g/dL 1.0  IgG (Immunoglobin G), Serum Latest Ref Range: [phone] mg/dL 4157 (H)  IgA Latest Ref Range: 87-352 mg/dL 38 (L)  IgM, Serum Latest Ref Range: 26-217 mg/dL 40  Kappa free light chain Latest Ref Range: 3.30-19.40 mg/L 104.60 (H)  Lamda free light chains Latest Ref Range: 5.71-26.30  mg/L 11.54  Kappa, lamda light chain ratio Latest Ref Range: 0.26-1.65  9.06 (H)  M Protein SerPl Elph-Mcnc Latest Ref Range: Not Observed g/dL 2.7 (H)  WBC Latest Ref Range: 4.0-10.5 K/uL 3.9 (L)  RBC Latest Ref Range: 3.87-5.11 MIL/uL 3.63 (L)  Hemoglobin Latest Ref Range: 12.0-15.0 g/dL 8.9 (L)  HCT Latest Ref Range: 36.0-46.0 % 29.3 (L)  MCV Latest Ref Range: 78.0-100.0 fL 80.7  MCH Latest Ref Range: 26.0-34.0 pg 24.5 (L)  MCHC Latest Ref Range: 30.0-36.0 g/dL 30.4  RDW Latest Ref Range: 11.5-15.5 % 19.4 (H)  Platelets Latest Ref Range: 150-400 K/uL 217  Neutrophils Latest Ref Range: 43-77 % 49  Lymphocytes Latest Ref Range: 12-46 % 44  Monocytes Relative Latest Ref Range: 3-12 % 5  Eosinophil Latest Ref Range: 0-5 % 2  Basophil Latest Ref Range: 0-1 % 0  NEUT# Latest Ref Range: 1.7-7.7 K/uL 1.9  Lymphocyte # Latest Ref Range: 0.7-4.0 K/uL 1.7  Monocyte # Latest Ref Range: 0.1-1.0 K/uL 0.2  Eosinophils Absolute Latest Ref Range: 0.0-0.7 K/uL 0.1  Basophils Absolute Latest Ref Range: 0.0-0.1 K/uL 0.0  RBC. Latest Ref Range:  3.87-5.11 MIL/uL 3.63 (L)  Retic Ct Pct Latest Ref Range: 0.4-3.1 % 0.8  Retic Count, Manual Latest Ref Range: 19.0-186.0 K/uL 29.0  Haptoglobin Latest Ref Range: 34-200 mg/dL 98  Erythropoietin Latest Ref Range: 2.6-18.5 mIU/mL 246.4 (H)  Sed Rate Latest Ref Range: 0-22 mm/hr 138 (H)  Please Note (HCV): Unknown Comment   Results for Kelli Hurley, Kelli Hurley (MRN [id number]) as of 09/16/2014 16:36  Ref. Range 08/04/2014 08:00  Time-UPE24 Latest Units: hours 24  Volume, Urine-UPE24 Latest Units: mL 1650  Total Protein, Urine-UPE24 Latest Ref Range: Not Estab. mg/dL 16.1  Total Protein, Urine-Ur/day Latest Ref Range: 30.0-150.0 mg/24 hr 265.7 (H)  ALBUMIN, U Latest Units: % 100.0  Alpha 2, Urine Latest Units: % 0.0  Free Lt Chn Excr Rate Latest Ref Range: 1.35-24.19 mg/L 6.34  Free Lambda Lt Chains,Ur Latest Ref Range: 0.24-6.66 mg/L 1.24  Free Kappa/Lambda Ratio  Latest Ref Range: 2.04-10.37  5.11   Bone Marrow BiopsyS Diagnosis Bone Marrow, Aspirate,Biopsy, and Clot - NORMOCELLULAR BONE MARROW FOR AGE WITH PLASMA CELL NEOPLASM. - TRILINEAGE HEMATOPOIESIS. - SEE COMMENT. PERIPHERAL BLOOD: - NORMOCYTIC-NORMOCHROMIC ANEMIA - LEUKOPENIA Diagnosis Note Despite limited aspirate material, the plasma cell component is increased in the marrow representing an estimated 18% of all cells with associated atypical cytomorphologic features. Immunohistochemical stains show the the plasma cells are kappa light chain-restricted consistent with plasma cell neoplasm. The background shows trilineage hematopoiesis with non specific changes. Correlation with cytogenetic and FISH studies is recommended. (BNS:ds/gt, 09/04/14) Susanne Greenhouse MD Pathologist, Electronic Signature (Case signed 09/07/2014)   RADIOGRAPHY: I have reviewed the images below and agree with the results. CLINICAL DATA: Normocytic hypochromic anemia. Leukopenia.  EXAM: METASTATIC BONE SURVEY  COMPARISON: None.  FINDINGS: Multilevel degenerative disc disease is noted in the cervical and thoracic spine. Severe degenerative disc disease is noted in both knees. No areas of abnormal sclerosis or lytic destruction are seen in the visualized skeleton.  IMPRESSION: No lytic lesions are noted in the visualized skeleton.   Electronically Signed  By: Marijo Conception, M.D.  On: 08/07/2014 12:33  PATHOLOGY:  None   ASSESSMENT/PLAN:  Multiple Myeloma IgG kappa, STAGE II, serum albumin 3.5 g/dl IgG of 04/14/2007 MG/DL, total M spike of 2.7 g/DL Beta-2 microglobulin of 3.7 MG/L Severe Anemia BMBX with 18% monoclonal plasma cells, kappa light chain restsricted. 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 Based upon staging, has stage II disease. Based upon cytogenetics has high risk disease.  Although her myeloma survey is negative I have recommended a more sensitive study, specifically PET/CT imaging in accordance with the NCCN guidelines.  I discussed her diagnosis today with the patient and her family in detail. They were provided with reading information from the NCCN and also the leukemia and lymphoma society.  I will most likely begin her on RVD therapy. We will also make a referral to [facility] for consideration of autologous transplant. She will need a 24-hour urine for UPEP/UIEP.  I will see her back post PET to discuss therapy. In addition I advised the patient and family to read through the information provided and bring a list of questions with them. I have had the meet with Hildred Alamin today our patient navigator. They were given her contact information as well.  Orders Placed This  Encounter  Procedures  . Immunofixation, urine    Standing Status: Future     Number of Occurrences: 1     Standing Expiration Date: 09/15/2015  . Protein electrophoresis, urine    Standing Status: Future     Number of Occurrences: 1     Standing Expiration Date: 09/15/2015    All questions were answered. The patient knows to call the clinic with any problems, questions or concerns. We can certainly see the patient much sooner if necessary.  This document serves as a record of services personally performed by Ancil Linsey, MD. It was created on her behalf by Arlyce Harman, a trained medical scribe. The creation of this record is based on the scribe's personal observations and the provider's statements to them. This document has been checked and approved by the attending provider.  I have reviewed the above documentation for accuracy and completeness, and I agree with the above.  This note is electronically signed by:  Molli Hazard, MD  10/17/2014 7:51 PM

## 2014-09-21 ENCOUNTER — Other Ambulatory Visit (HOSPITAL_COMMUNITY): Payer: Self-pay | Admitting: *Deleted

## 2014-09-21 LAB — UIFE/LIGHT CHAINS/TP QN, 24-HR UR
% BETA, Urine: 0 %
ALPHA 1 URINE: 0 %
Albumin, U: 100 %
Alpha 2, Urine: 0 %
Free Kappa/Lambda Ratio: 17.61 — ABNORMAL HIGH (ref 2.04–10.37)
Free Lambda Lt Chains,Ur: 0.71 mg/L (ref 0.24–6.66)
Free Lt Chn Excr Rate: 12.5 mg/L (ref 1.35–24.19)
GAMMA GLOBULIN URINE: 0 %
TIME-UPE24: 24 h
TOTAL PROTEIN, URINE-UPE24: 7.7 mg/dL
Total Protein, Urine-Ur/day: 84.7 mg/24 hr (ref 30.0–150.0)
VOLUME, URINE-UPE24: 1100 mL

## 2014-09-23 ENCOUNTER — Other Ambulatory Visit (HOSPITAL_COMMUNITY): Payer: Self-pay | Admitting: Hematology & Oncology

## 2014-09-23 ENCOUNTER — Ambulatory Visit (HOSPITAL_COMMUNITY)
Admission: RE | Admit: 2014-09-23 | Discharge: 2014-09-23 | Disposition: A | Discharge status: Home / Self Care | Payer: 59 | Source: Ambulatory Visit | Attending: Hematology & Oncology | Admitting: Hematology & Oncology

## 2014-09-23 ENCOUNTER — Encounter (HOSPITAL_COMMUNITY): Payer: Self-pay

## 2014-09-23 DIAGNOSIS — C9000 Multiple myeloma not having achieved remission: Secondary | ICD-10-CM | POA: Insufficient documentation

## 2014-09-23 LAB — GLUCOSE, CAPILLARY: Glucose-Capillary: 101 mg/dL — ABNORMAL HIGH (ref 65–99)

## 2014-09-23 MED ORDER — ONDANSETRON HCL 8 MG PO TABS: 8.0000 mg | ORAL_TABLET | Freq: Three times a day (TID) | ORAL | Status: DC | PRN

## 2014-09-23 MED ORDER — ASPIRIN EC 81 MG PO TBEC: 81.0000 mg | DELAYED_RELEASE_TABLET | Freq: Every day | ORAL | Status: DC

## 2014-09-23 MED ORDER — LENALIDOMIDE 25 MG PO CAPS: 25.0000 mg | ORAL_CAPSULE | Freq: Every day | ORAL | Status: DC

## 2014-09-23 MED ORDER — DEXAMETHASONE 4 MG PO TABS: ORAL_TABLET | ORAL | Status: DC

## 2014-09-23 MED ORDER — ACYCLOVIR 400 MG PO TABS: 400.0000 mg | ORAL_TABLET | Freq: Two times a day (BID) | ORAL | Status: DC

## 2014-09-23 MED ORDER — PROCHLORPERAZINE MALEATE 10 MG PO TABS: 10.0000 mg | ORAL_TABLET | Freq: Four times a day (QID) | ORAL | Status: DC | PRN

## 2014-09-23 NOTE — Patient Instructions (Addendum)
Kelli Hurley   CHEMOTHERAPY INSTRUCTIONS  Premeds: Zofran - we will give you a Zofran tablet prior to you receiving your Velcade injection.  Velcade - You will take this on Day 1, 8, 15 every 21 days here at the [facility]. This will be an injection in your abdominal tissue. Side Effects: peripheral neuropathy, (numbness, tingling, burning in hands/fingers/feet/toes), hypotension, nausea, vomiting, diarrhea, blurred vision, fatigue, bone marrow suppression (low white blood cells-fight infection, low red blood cells- this makes up your hemoglobin & circulating blood volume, low platelets-this is what helps your blood to clot)   Revlimid - chemo med - side effects: neutropenia (low white blood cells), thrombocytopenia (low platelets), deep vein thrombosis (blood clot in extremity), pulmonary embolism (blood clot in lung), itching, rash, dry skin, diarrhea, constipation, nausea, vomiting, fatigue, dizziness, headache, muscle cramps/aches, inflammation of the nose and throat, fever, upper respiratory tract infections. You will take 1 capsule a day x 14 days in a row followed by a 7 day rest period in which you take no Revlimid. Repeat every 21 days. Refer to your calendar.  Dexamethasone 2m tablet. Take 10 tablets (470m once a week during chemo. Pick a day and continue to take Dex on that day every week. Refer to your calendar.   POTENTIAL SIDE EFFECTS OF TREATMENT: Increased Susceptibility to Infection, Vomiting, Constipation, Hair Thinning, Changes in Character of Skin and Nails (brittleness, dryness,etc.), Bone Marrow Suppression, Nausea, Sun Sensitivity and Mouth Sores    EDUCATIONAL MATERIALS GIVEN AND REVIEWED: Chemotherapy and You Book Specific Instructions Sheets: Revlimid, Velcade, Dexamethasone, Acyclovir, Zofran, Compazine, Aspirin   SELF CARE ACTIVITIES WHILE ON CHEMOTHERAPY: Increase your fluid intake 48 hours prior to treatment and drink at  least 2 quarts per day after treatment., No alcohol intake., No aspirin or other medications unless approved by your oncologist., Eat foods that are light and easy to digest., Eat foods at cold or room temperature., No fried, fatty, or spicy foods immediately before or after treatment., Have teeth cleaned professionally before starting treatment. Keep dentures and partial plates clean., Use soft toothbrush and do not use mouthwashes that contain alcohol. Biotene is a good mouthwash that is available at most pharmacies or may be ordered by calling ([phone] Use warm salt water gargles (1 teaspoon salt per 1 quart warm water) before and after meals and at bedtime. Or you may rinse with 2 tablespoons of three -percent hydrogen peroxide mixed in eight ounces of water., Always use sunscreen with SPF (Sun Protection Factor) of 30 or higher., Use your nausea medication as directed to prevent nausea., Use your stool softener or laxative as directed to prevent constipation. and Use your anti-diarrheal medication as directed to stop diarrhea.  Please wash your hands for at least 30 seconds using warm soapy water. Handwashing is the #1 way to prevent the spread of germs. Stay away from sick people or people who are getting over a cold. If you develop respiratory systems such as green/yellow mucus production or productive cough or persistent cough let us[place]oreanow and we will see if you need an antibiotic. It is a good idea to keep a pair of gloves on when going into grocery stores/[employer] to decrease your risk of coming into contact with germs on the carts, etc. Carry alcohol hand gel with you at all times and use it frequently if out in public. All foods need to be cooked thoroughly. No raw foods. No medium or undercooked meats, eggs. If your  food is cooked medium well, it does not need to be hot pink or saturated with bloody liquid at all. Vegetables and fruits need to be washed/rinsed under the faucet with a dish  detergent before being consumed. You can eat raw fruits and vegetables unless we tell you otherwise but it would be best if you cooked them or bought frozen. Do not eat off of salad bars or hot bars unless you really trust the cleanliness of the restaurant. If you need dental work, please let Dr. Whitney Hurley know before you go for your appointment so that we can coordinate the best possible time for you in regards to your chemo regimen. You need to also let your dentist know that you are actively taking chemo. We may need to do labs prior to your dental appointment. We also want your bowels moving at least every other day. If this is not happening, we need to know so that we can get you on a bowel regimen to help you go.       MEDICATIONS: You have been given prescriptions for the following medications:  Aspirin 392m. Take 1 tablet daily. Everyday!!!  Acyclovir 4085mtablet. Take 1 tablet two times a day.  Dexamethasone 28m17mablet. Take 10 tablets (108m47mnce a week. Pick a day and take it that same day every week. Refer to your calendar.  Revlimid 25mg56msule. Take 1 tablet daily x 14 days in a row followed by a 7 day rest period (no medication). Repeat every 21 days. Refer to your calendar.  Zofran 8mg t43met. Take 1 tablet every 8 hours as needed for nausea/vomiting. (#1 nausea med to take, this can constipate)  Compazine 10mg t53mt. Take 1 tablet every 6 hours as needed for nausea/vomiting. (#2 nausea med to take, this can make you sleepy)    Over-the-Counter Meds:  Miralax 1 capful in 8 oz of fluid daily. May increase to two times a day if needed. This is a stool softener. If this doesn't work proceed you can add:  Senokot S  - start with 1 tablet two times a day and increase to 4 tablets two times a day if needed. (total of 8 tablets in a 24 hour period). This is a stimulant laxative.   Call us if tKoreas does not help your bowels move.   Imodium 2mg cap128me. Take 2 capsules after  the 1st loose stool and then 1 capsule every 2 hours until you go a total of 12 hours without having a loose stool. [facility].  SYMPTOMS TO REPORT AS SOON AS POSSIBLE AFTER TREATMENT:  FEVER GREATER THAN 100.5 F  CHILLS WITH OR WITHOUT FEVER  NAUSEA AND VOMITING THAT IS NOT CONTROLLED WITH YOUR NAUSEA MEDICATION  UNUSUAL SHORTNESS OF BREATH  UNUSUAL BRUISING OR BLEEDING  TENDERNESS IN MOUTH AND THROAT WITH OR WITHOUT PRESENCE OF ULCERS  URINARY PROBLEMS  BOWEL PROBLEMS  UNUSUAL RASH    Wear comfortable clothing and clothing appropriate for easy access to any Portacath or PICC line. Let us know [place] there is anything that we can do to make your therapy better!      I have been informed and understand all of the instructions given to me and have received a copy. I have been instructed to call the clinic [phone] physician as soon as possible for continued medical care, if indicated. I do not have any more questions at this time but understand that I  may call the [place] or the Patient Navigator at [phone] during office hours should I have questions or need assistance in obtaining follow-up care.             Bortezomib injection What is this medicine? BORTEZOMIB (bor TEZ oh mib) is a chemotherapy drug. It slows the growth of cancer cells. This medicine is used to treat multiple myeloma, and certain lymphomas, such as mantle-cell lymphoma. This medicine may be used for other purposes; ask your health care provider or pharmacist if you have questions. COMMON BRAND NAME(S): Velcade What should I tell my health care provider before I take this medicine? They need to know if you have any of these conditions: -diabetes -heart disease -irregular heartbeat -liver disease -on hemodialysis -low blood counts, like low white blood cells, platelets, or hemoglobin -peripheral neuropathy -taking medicine for  blood pressure -an unusual or allergic reaction to bortezomib, mannitol, boron, other medicines, foods, dyes, or preservatives -pregnant or trying to get pregnant -breast-feeding How should I use this medicine? This medicine is for injection into a vein or for injection under the skin. It is given by a health care professional in a hospital or clinic setting. Talk to your pediatrician regarding the use of this medicine in children. Special care may be needed. Overdosage: If you think you have taken too much of this medicine contact a poison control center or emergency room at once. NOTE: This medicine is only for you. Do not share this medicine with others. What if I miss a dose? It is important not to miss your dose. Call your doctor or health care professional if you are unable to keep an appointment. What may interact with this medicine? This medicine may interact with the following medications: -ketoconazole -rifampin -ritonavir -St. John's Wort This list may not describe all possible interactions. Give your health care provider a list of all the medicines, herbs, non-prescription drugs, or dietary supplements you use. Also tell them if you smoke, drink alcohol, or use illegal drugs. Some items may interact with your medicine. What should I watch for while using this medicine? Visit your doctor for checks on your progress. This drug may make you feel generally unwell. This is not uncommon, as chemotherapy can affect healthy cells as well as cancer cells. Report any side effects. Continue your course of treatment even though you feel ill unless your doctor tells you to stop. You may get drowsy or dizzy. Do not drive, use machinery, or do anything that needs mental alertness until you know how this medicine affects you. Do not stand or sit up quickly, especially if you are an older patient. This reduces the risk of dizzy or fainting spells. In some cases, you may be given additional medicines  to help with side effects. Follow all directions for their use. Call your doctor or health care professional for advice if you get a fever, chills or sore throat, or other symptoms of a cold or flu. Do not treat yourself. This drug decreases your body's ability to fight infections. Try to avoid being around people who are sick. This medicine may increase your risk to bruise or bleed. Call your doctor or health care professional if you notice any unusual bleeding. You may need blood work done while you are taking this medicine. In some patients, this medicine may cause a serious brain infection that may cause death. If you have any problems seeing, thinking, speaking, walking, or standing, tell your doctor right away. If  you cannot reach your doctor, urgently seek other source of medical care. Do not become pregnant while taking this medicine. Women should inform their doctor if they wish to become pregnant or think they might be pregnant. There is a potential for serious side effects to an unborn child. Talk to your health care professional or pharmacist for more information. Do not breast-feed an infant while taking this medicine. Check with your doctor or health care professional if you get an attack of severe diarrhea, nausea and vomiting, or if you sweat a lot. The loss of too much body fluid can make it dangerous for you to take this medicine. What side effects may I notice from receiving this medicine? Side effects that you should report to your doctor or health care professional as soon as possible: -allergic reactions like skin rash, itching or hives, swelling of the face, lips, or tongue -breathing problems -changes in hearing -changes in vision -fast, irregular heartbeat -feeling faint or lightheaded, falls -pain, tingling, numbness in the hands or feet -right upper belly pain -seizures -swelling of the ankles, feet, hands -unusual bleeding or bruising -unusually weak or  tired -vomiting -yellowing of the eyes or skin Side effects that usually do not require medical attention (report to your doctor or health care professional if they continue or are bothersome): -changes in emotions or moods -constipation -diarrhea -loss of appetite -headache -irritation at site where injected -nausea This list may not describe all possible side effects. Call your doctor for medical advice about side effects. You may report side effects to FDA at 1-800-FDA-1088. Where should I keep my medicine? This drug is given in a hospital or clinic and will not be stored at home. NOTE: This sheet is a summary. It may not cover all possible information. If you have questions about this medicine, talk to your doctor, pharmacist, or health care provider.  2015, Elsevier/Gold Standard. (2012-11-18 12:46:32) Lenalidomide Oral Capsules What is this medicine? LENALIDOMIDE (len a LID oh mide) is a chemotherapy drug that targets specific proteins within cancer cells and stops the cancer cell from growing. It is used to treat multiple myeloma, mantle cell lymphoma, and some myelodysplastic syndromes that cause severe anemia requiring blood transfusions. This medicine may be used for other purposes; ask your health care provider or pharmacist if you have questions. COMMON BRAND NAME(S): Revlimid What should I tell my health care provider before I take this medicine? They need to know if you have any of these conditions: -blood clots in the legs or the lungs -high blood pressure -high cholesterol -infection -irregular monthly periods or menstrual cycles -kidney disease -liver disease -smoke tobacco -thyroid disease -an unusual or allergic reaction to lenalidomide, other medicines, foods, dyes, or preservatives -pregnant or trying to get pregnant -breast-feeding How should I use this medicine? Take this medicine by mouth with a glass of water. Follow the directions on the prescription  label. Do not cut, crush, or chew this medicine. Take your medicine at regular intervals. Do not take it more often than directed. Do not stop taking except on your doctor's advice. A MedGuide will be given with each prescription and refill. Read this guide carefully each time. The MedGuide may change frequently. Talk to your pediatrician regarding the use of this medicine in children. Special care may be needed. Overdosage: If you think you have taken too much of this medicine contact a poison control center or emergency room at once. NOTE: This medicine is only for you. Do not share  this medicine with others. What if I miss a dose? If you miss a dose, take it as soon as you can. If your next dose is to be taken in less than 12 hours, then do not take the missed dose. Take the next dose at your regular time. Do not take double or extra doses. What may interact with this medicine? This medicine may interact with the following medications: -digoxin -medicines that increase the risk of thrombosis like estrogens or erythropoietic agents (e.g., epoetin alfa and darbepoetin alfa) -warfarin This list may not describe all possible interactions. Give your health care provider a list of all the medicines, herbs, non-prescription drugs, or dietary supplements you use. Also tell them if you smoke, drink alcohol, or use illegal drugs. Some items may interact with your medicine. What should I watch for while using this medicine? Visit your doctor for regular check ups. Tell your doctor or healthcare professional if your symptoms do not start to get better or if they get worse. You will need to have important blood work done while you are taking this medicine. This medicine is available only through a special program. Doctors, pharmacies, and patients must meet all of the conditions of the program. Your health care provider will help you get signed up with the program if you need this medicine. Through the program  you will only receive up to a 28 day supply of the medicine at one time. You will need a new prescription for each refill. This medicine can cause birth defects. Do not get pregnant while taking this drug. Females with child-bearing potential will need to have 2 negative pregnancy tests before starting this medicine. Pregnancy testing must be done every 2 to 4 weeks as directed while taking this medicine. Use 2 reliable forms of birth control together while you are taking this medicine and for 1 month after you stop taking this medicine. If you think that you might be pregnant talk to your doctor right away. Men must use a latex condom during sexual contact with a woman while taking this medicine and for 28 days after you stop taking this medicine. A latex condom is needed even if you have had a vasectomy. Contact your doctor right away if your partner becomes pregnant. Do not donate sperm while taking this medicine and for 28 days after you stop taking this medicine. Do not give blood while taking the medicine and for 1 month after completion of treatment to avoid exposing pregnant women to the medicine through the donated blood. Talk to your doctor about your risk of cancer. You may be more at risk for certain types of cancers if you take this medicine. What side effects may I notice from receiving this medicine? Side effects that you should report to your doctor or health care professional as soon as possible: -allergic reactions like skin rash, itching or hives, swelling of the face, lips, or tongue -breathing problems -chest pain or tightness -fast, irregular heartbeat -low blood counts - this medicine may decrease the number of white blood cells, red blood cells and platelets. You may be at increased risk for infections and bleeding. -seizures -signs and symptoms of bleeding such as bloody or black, tarry stools; red or dark-brown urine; spitting up blood or brown material that looks like coffee  grounds; red spots on the skin; unusual bruising or bleeding from the eye, gums, or nose -signs and symptoms of a blood clot such as breathing problems; changes in vision; chest pain;  severe, sudden headache; pain, swelling, warmth in the leg; trouble speaking; sudden numbness or weakness of the face, arm or leg -signs and symptoms of liver injury like dark yellow or brown urine; general ill feeling or flu-like symptoms; light-colored stools; loss of appetite; nausea; right upper belly pain; unusually weak or tired; yellowing of the eyes or skin -signs and symptoms of a stroke like changes in vision; confusion; trouble speaking or understanding; severe headaches; sudden numbness or weakness of the face, arm or leg; trouble walking; dizziness; loss of balance or coordination -sweating -vomiting Side effects that usually do not require medical attention (report to your doctor or health care professional if they continue or are bothersome): -constipation -cough -diarrhea -tiredness This list may not describe all possible side effects. Call your doctor for medical advice about side effects. You may report side effects to FDA at 1-800-FDA-1088. Where should I keep my medicine? Keep out of the reach of children. Store at room temperature between 15 and 30 degrees C (59 and 86 degrees F). Throw away any unused medicine after the expiration date. NOTE: This sheet is a summary. It may not cover all possible information. If you have questions about this medicine, talk to your doctor, pharmacist, or health care provider.  2015, Elsevier/Gold Standard. (2013-04-29 18:30:01) Dexamethasone tablets What is this medicine? DEXAMETHASONE (dex a METH a sone) is a corticosteroid. It is commonly used to treat inflammation of the skin, joints, lungs, and other organs. Common conditions treated include asthma, allergies, and arthritis. It is also used for other conditions, such as blood disorders and diseases of the  adrenal glands. This medicine may be used for other purposes; ask your health care provider or pharmacist if you have questions. COMMON BRAND NAME(S): Decadron, DexPak Sterling Big, DexPak TaperPak, Zema-Pak What should I tell my health care provider before I take this medicine? They need to know if you have any of these conditions: -Cushing's syndrome -diabetes -glaucoma -heart problems or disease -high blood pressure -infection like herpes, measles, tuberculosis, or chickenpox -kidney disease -liver disease -mental problems -myasthenia gravis -osteoporosis -previous heart attack -seizures -stomach, ulcer or intestine disease including colitis and diverticulitis -thyroid problem -an unusual or allergic reaction to dexamethasone, corticosteroids, other medicines, lactose, foods, dyes, or preservatives -pregnant or trying to get pregnant -breast-feeding How should I use this medicine? Take this medicine by mouth with a drink of water. Follow the directions on the prescription label. Take it with food or milk to avoid stomach upset. If you are taking this medicine once a day, take it in the morning. Do not take more medicine than you are told to take. Do not suddenly stop taking your medicine because you may develop a severe reaction. Your doctor will tell you how much medicine to take. If your doctor wants you to stop the medicine, the dose may be slowly lowered over time to avoid any side effects. Talk to your pediatrician regarding the use of this medicine in children. Special care may be needed. Patients over 24 years old may have a stronger reaction and need a smaller dose. Overdosage: If you think you have taken too much of this medicine contact a poison control center or emergency room at once. NOTE: This medicine is only for you. Do not share this medicine with others. What if I miss a dose? If you miss a dose, take it as soon as you can. If it is almost time for your next dose,  talk to your doctor or  health care professional. You may need to miss a dose or take an extra dose. Do not take double or extra doses without advice. What may interact with this medicine? Do not take this medicine with any of the following medications: -mifepristone, RU-486 -vaccines This medicine may also interact with the following medications: -amphotericin B -antibiotics like clarithromycin, erythromycin, and troleandomycin -aspirin and aspirin-like drugs -barbiturates like phenobarbital -carbamazepine -cholestyramine -cholinesterase inhibitors like donepezil, galantamine, rivastigmine, and tacrine -cyclosporine -digoxin -diuretics -ephedrine -female hormones, like estrogens or progestins and birth control pills -indinavir -isoniazid -ketoconazole -medicines for diabetes -medicines that improve muscle tone or strength for conditions like myasthenia gravis -NSAIDs, medicines for pain and inflammation, like ibuprofen or naproxen -phenytoin -rifampin -thalidomide -warfarin This list may not describe all possible interactions. Give your health care provider a list of all the medicines, herbs, non-prescription drugs, or dietary supplements you use. Also tell them if you smoke, drink alcohol, or use illegal drugs. Some items may interact with your medicine. What should I watch for while using this medicine? Visit your doctor or health care professional for regular checks on your progress. If you are taking this medicine over a prolonged period, carry an identification card with your name and address, the type and dose of your medicine, and your doctor's name and address. This medicine may increase your risk of getting an infection. Stay away from people who are sick. Tell your doctor or health care professional if you are around anyone with measles or chickenpox. If you are going to have surgery, tell your doctor or health care professional that you have taken this medicine within the  last twelve months. Ask your doctor or health care professional about your diet. You may need to lower the amount of salt you eat. The medicine can increase your blood sugar. If you are a diabetic check with your doctor if you need help adjusting the dose of your diabetic medicine. What side effects may I notice from receiving this medicine? Side effects that you should report to your doctor or health care professional as soon as possible: -allergic reactions like skin rash, itching or hives, swelling of the face, lips, or tongue -changes in vision -fever, sore throat, sneezing, cough, or other signs of infection, wounds that will not heal -increased thirst -mental depression, mood swings, mistaken feelings of self importance or of being mistreated -pain in hips, back, ribs, arms, shoulders, or legs -redness, blistering, peeling or loosening of the skin, including inside the mouth -trouble passing urine or change in the amount of urine -swelling of feet or lower legs -unusual bleeding or bruising Side effects that usually do not require medical attention (report to your doctor or health care professional if they continue or are bothersome): -headache -nausea, vomiting -skin problems, acne, thin and shiny skin -weight gain This list may not describe all possible side effects. Call your doctor for medical advice about side effects. You may report side effects to FDA at 1-800-FDA-1088. Where should I keep my medicine? Keep out of the reach of children. Store at room temperature between 20 and 25 degrees C (68 and 77 degrees F). Protect from light. Throw away any unused medicine after the expiration date. NOTE: This sheet is a summary. It may not cover all possible information. If you have questions about this medicine, talk to your doctor, pharmacist, or health care provider.  2015, Else[employer] Standard. (2007-05-16 14:02:13) Aspirin, ASA oral tablets What is this medicine? ASPIRIN (AS  pir in) is a pain  reliever. It is used to treat mild pain and fever. This medicine is also used as directed by a doctor to prevent and to treat heart attacks, to prevent strokes, and to treat arthritis or inflammation. This medicine may be used for other purposes; ask your health care provider or pharmacist if you have questions. COMMON BRAND NAME(S): Aspir-Low, Aspir-Trin, Aspirtab, Bayer Advanced Aspirin, Bayer Aspirin, Bayer Aspirin Extra Strength, Bayer Aspirin Plus, Nature conservation officer, Nature conservation officer Plus, Bayer Genuine Aspirin, Bayer Womens Aspirin, Bufferin, Bufferin Extra Strength, Bufferin Low Dose What should I tell my health care provider before I take this medicine? They need to know if you have any of these conditions: -anemia -asthma -bleeding problems -child with chickenpox, the flu, or other viral infection -diabetes -gout -if you frequently drink alcohol containing drinks -kidney disease -liver disease -low level of vitamin K -lupus -smoke tobacco -stomach ulcers or other problems -an unusual or allergic reaction to aspirin, tartrazine dye, other medicines, dyes, or preservatives -pregnant or trying to get pregnant -breast-feeding How should I use this medicine? Take this medicine by mouth with a glass of water. Follow the directions on the package or prescription label. You can take this medicine with or without food. If it upsets your stomach, take it with food. Do not take your medicine more often than directed. Talk to your pediatrician regarding the use of this medicine in children. While this drug may be prescribed for children as young as 37 years of age for selected conditions, precautions do apply. Children and teenagers should not use this medicine to treat chicken pox or flu symptoms unless directed by a doctor. Patients over 33 years old may have a stronger reaction and need a smaller dose. Overdosage: If you think you have taken too much of this medicine  contact a poison control center or emergency room at once. NOTE: This medicine is only for you. Do not share this medicine with others. What if I miss a dose? If you are taking this medicine on a regular schedule and miss a dose, take it as soon as you can. If it is almost time for your next dose, take only that dose. Do not take double or extra doses. What may interact with this medicine? Do not take this medicine with any of the following medications: -cidofovir -ketorolac -probenecid This medicine may also interact with the following medications: -alcohol -alendronate -bismuth subsalicylate -flavocoxid -herbal supplements like feverfew, garlic, ginger, ginkgo biloba, horse chestnut -medicines for diabetes or glaucoma like acetazolamide, methazolamide -medicines for gout -medicines that treat or prevent blood clots like enoxaparin, heparin, ticlopidine, warfarin -other aspirin and aspirin-like medicines -NSAIDs, medicines for pain and inflammation, like ibuprofen or naproxen -pemetrexed -sulfinpyrazone -varicella live vaccine This list may not describe all possible interactions. Give your health care provider a list of all the medicines, herbs, non-prescription drugs, or dietary supplements you use. Also tell them if you smoke, drink alcohol, or use illegal drugs. Some items may interact with your medicine. What should I watch for while using this medicine? If you are treating yourself for pain, tell your doctor or health care professional if the pain lasts more than 10 days, if it gets worse, or if there is a new or different kind of pain. Tell your doctor if you see redness or swelling. Also, check with your doctor if you have a fever that lasts for more than 3 days. Only take this medicine to prevent heart attacks or blood clotting if prescribed by  your doctor or health care professional. Do not take aspirin or aspirin-like medicines with this medicine. Too much aspirin can be  dangerous. Always read the labels carefully. This medicine can irritate your stomach or cause bleeding problems. Do not smoke cigarettes or drink alcohol while taking this medicine. Do not lie down for 30 minutes after taking this medicine to prevent irritation to your throat. If you are scheduled for any medical or dental procedure, tell your healthcare provider that you are taking this medicine. You may need to stop taking this medicine before the procedure. This medicine may be used to treat migraines. If you take migraine medicines for 10 or more days a month, your migraines may get worse. Keep a diary of headache days and medicine use. Contact your healthcare professional if your migraine attacks occur more frequently. What side effects may I notice from receiving this medicine? Side effects that you should report to your doctor or health care professional as soon as possible: -allergic reactions like skin rash, itching or hives, swelling of the face, lips, or tongue -breathing problems -changes in hearing, ringing in the ears -confusion -general ill feeling or flu-like symptoms -pain on swallowing -redness, blistering, peeling or loosening of the skin, including inside the mouth or nose -signs and symptoms of bleeding such as bloody or black, tarry stools; red or dark-brown urine; spitting up blood or brown material that looks like coffee grounds; red spots on the skin; unusual bruising or bleeding from the eye, gums, or nose -trouble passing urine or change in the amount of urine -unusually weak or tired -yellowing of the eyes or skin Side effects that usually do not require medical attention (report to your doctor or health care professional if they continue or are bothersome): -diarrhea or constipation -headache -nausea, vomiting -stomach gas, heartburn This list may not describe all possible side effects. Call your doctor for medical advice about side effects. You may report side  effects to FDA at 1-800-FDA-1088. Where should I keep my medicine? Keep out of the reach of children. Store at room temperature between 15 and 30 degrees C (59 and 86 degrees F). Protect from heat and moisture. Do not use this medicine if it has a strong vinegar smell. Throw away any unused medicine after the expiration date. NOTE: This sheet is a summary. It may not cover all possible information. If you have questions about this medicine, talk to your doctor, pharmacist, or health care provider.  2015, Elsevier/Gold Standard. (2012-09-24 11:30:31) Acyclovir tablets or capsules What is this medicine? ACYCLOVIR (ay SYE kloe veer) is an antiviral medicine. It is used to treat or prevent infections caused by certain kinds of viruses. Examples of these infections include herpes and shingles. This medicine will not cure herpes. This medicine may be used for other purposes; ask your health care provider or pharmacist if you have questions. COMMON BRAND NAME(S): Zovirax What should I tell my health care provider before I take this medicine? They need to know if you have any of these conditions: -kidney disease -an unusual or allergic reaction to acyclovir, ganciclovir, valacyclovir, other medicines, foods, dyes, or preservatives -pregnant or trying to get pregnant -breast-feeding How should I use this medicine? Take this medicine by mouth with a glass of water. Follow the directions on the prescription label. You can take it with or without food. Take your medicine at regular intervals. Do not take your medicine more often than directed. Take all of your medicine as directed even if you  think your are better. Do not skip doses or stop your medicine early. Talk to your pediatrician regarding the use of this medicine in children. While this drug may be prescribed for selected conditions, precautions do apply. Overdosage: If you think you have taken too much of this medicine contact a poison control  center or emergency room at once. NOTE: This medicine is only for you. Do not share this medicine with others. What if I miss a dose? If you miss a dose, take it as soon as you can. If it is almost time for your next dose, take only that dose. Do not take double or extra doses. What may interact with this medicine? -probenecid This list may not describe all possible interactions. Give your health care provider a list of all the medicines, herbs, non-prescription drugs, or dietary supplements you use. Also tell them if you smoke, drink alcohol, or use illegal drugs. Some items may interact with your medicine. What should I watch for while using this medicine? Tell your doctor or health care professional if your symptoms do not improve. This medicine works best when started very early in the course of an infection. Begin treatment at the first signs of infection. Drink 6 to 8 glasses of water or fluids every day while you are taking this medicine. This will help prevent side effects. You can still pass chickenpox, shingles, or herpes to another person even while you are taking this medicine. Avoid contact with others as directed. Genital herpes is a sexually transmitted disease. Talk to your doctor about how to stop the spread of infection. What side effects may I notice from receiving this medicine? Side effects that you should report to your doctor or health care professional as soon as possible: -allergic reactions like skin rash, itching or hives, swelling of the face, lips, or tongue -chest pain -confusion, hallucinations, tremor -dark urine -increased sensitivity to the sun -redness, blistering, peeling or loosening of the skin, including inside the mouth -seizures -trouble passing urine or change in the amount of urine -unusual bleeding or bruising, or pinpoint red spots on the skin -unusually weak or tired -yellowing of the eyes or skin Side effects that usually do not require medical  attention (report to your doctor or health care professional if they continue or are bothersome): -diarrhea -fever -headache -nausea, vomiting -stomach upset This list may not describe all possible side effects. Call your doctor for medical advice about side effects. You may report side effects to FDA at 1-800-FDA-1088. Where should I keep my medicine? Keep out of the reach of children. Store at room temperature between 15 and 25 degrees C (59 and 77 degrees F). Throw away any unused medicine after the expiration date. NOTE: This sheet is a summary. It may not cover all possible information. If you have questions about this medicine, talk to your doctor, pharmacist, or health care provider.  2015, [employer] Standard. (2007-04-10 13:15:46) Ondansetron tablets What is this medicine? ONDANSETRON (on DAN se tron) is used to treat nausea and vomiting caused by chemotherapy. It is also used to prevent or treat nausea and vomiting after surgery. This medicine may be used for other purposes; ask your health care provider or pharmacist if you have questions. COMMON BRAND NAME(S): Zofran What should I tell my health care provider before I take this medicine? They need to know if you have any of these conditions: -heart disease -history of irregular heartbeat -liver disease -low levels of magnesium or potassium in the  blood -an unusual or allergic reaction to ondansetron, granisetron, other medicines, foods, dyes, or preservatives -pregnant or trying to get pregnant -breast-feeding How should I use this medicine? Take this medicine by mouth with a glass of water. Follow the directions on your prescription label. Take your doses at regular intervals. Do not take your medicine more often than directed. Talk to your pediatrician regarding the use of this medicine in children. Special care may be needed. Overdosage: If you think you have taken too much of this medicine contact a poison control  center or emergency room at once. NOTE: This medicine is only for you. Do not share this medicine with others. What if I miss a dose? If you miss a dose, take it as soon as you can. If it is almost time for your next dose, take only that dose. Do not take double or extra doses. What may interact with this medicine? Do not take this medicine with any of the following medications: -apomorphine -certain medicines for fungal infections like fluconazole, itraconazole, ketoconazole, posaconazole, voriconazole -cisapride -dofetilide -dronedarone -pimozide -thioridazine -ziprasidone This medicine may also interact with the following medications: -carbamazepine -certain medicines for depression, anxiety, or psychotic disturbances -fentanyl -linezolid -MAOIs like Carbex, Eldepryl, Marplan, Nardil, and Parnate -methylene blue (injected into a vein) -other medicines that prolong the QT interval (cause an abnormal heart rhythm) -phenytoin -rifampicin -tramadol This list may not describe all possible interactions. Give your health care provider a list of all the medicines, herbs, non-prescription drugs, or dietary supplements you use. Also tell them if you smoke, drink alcohol, or use illegal drugs. Some items may interact with your medicine. What should I watch for while using this medicine? Check with your doctor or health care professional right away if you have any sign of an allergic reaction. What side effects may I notice from receiving this medicine? Side effects that you should report to your doctor or health care professional as soon as possible: -allergic reactions like skin rash, itching or hives, swelling of the face, lips or tongue -breathing problems -confusion -dizziness -fast or irregular heartbeat -feeling faint or lightheaded, falls -fever and chills -loss of balance or coordination -seizures -sweating -swelling of the hands or feet -tightness in the  chest -tremors -unusually weak or tired Side effects that usually do not require medical attention (report to your doctor or health care professional if they continue or are bothersome): -constipation or diarrhea -headache This list may not describe all possible side effects. Call your doctor for medical advice about side effects. You may report side effects to FDA at 1-800-FDA-1088. Where should I keep my medicine? Keep out of the reach of children. Store between 2 and 30 degrees C (36 and 86 degrees F). Throw away any unused medicine after the expiration date. NOTE: This sheet is a summary. It may not cover all possible information. If you have questions about this medicine, talk to your doctor, pharmacist, or health care provider.  2015, [employer] Standard. (2012-10-30 16:27:45) Prochlorperazine tablets What is this medicine? PROCHLORPERAZINE (proe klor PER a zeen) helps to control severe nausea and vomiting. This medicine is also used to treat schizophrenia. It can also help patients who experience anxiety that is not due to psychological illness. This medicine may be used for other purposes; ask your health care provider or pharmacist if you have questions. COMMON BRAND NAME(S): Compazine What should I tell my health care provider before I take this medicine? They need to know if you have any  of these conditions: -blood disorders or disease -dementia -liver disease or jaundice -Parkinson's disease -uncontrollable movement disorder -an unusual or allergic reaction to prochlorperazine, other medicines, foods, dyes, or preservatives -pregnant or trying to get pregnant -breast-feeding How should I use this medicine? Take this medicine by mouth with a glass of water. Follow the directions on the prescription label. Take your doses at regular intervals. Do not take your medicine more often than directed. Do not stop taking this medicine suddenly. This can cause nausea, vomiting, and  dizziness. Ask your doctor or health care professional for advice. Talk to your pediatrician regarding the use of this medicine in children. Special care may be needed. While this drug may be prescribed for children as young as 2 years for selected conditions, precautions do apply. Overdosage: If you think you have taken too much of this medicine contact a poison control center or emergency room at once. NOTE: This medicine is only for you. Do not share this medicine with others. What if I miss a dose? If you miss a dose, take it as soon as you can. If it is almost time for your next dose, take only that dose. Do not take double or extra doses. What may interact with this medicine? Do not take this medicine with any of the following medications: -amoxapine -antidepressants like citalopram, escitalopram, fluoxetine, paroxetine, and sertraline -deferoxamine -dofetilide -maprotiline -tricyclic antidepressants like amitriptyline, clomipramine, imipramine, nortiptyline and others This medicine may also interact with the following medications: -lithium -medicines for pain -phenytoin -propranolol -warfarin This list may not describe all possible interactions. Give your health care provider a list of all the medicines, herbs, non-prescription drugs, or dietary supplements you use. Also tell them if you smoke, drink alcohol, or use illegal drugs. Some items may interact with your medicine. What should I watch for while using this medicine? Visit your doctor or health care professional for regular checks on your progress. You may get drowsy or dizzy. Do not drive, use machinery, or do anything that needs mental alertness until you know how this medicine affects you. Do not stand or sit up quickly, especially if you are an older patient. This reduces the risk of dizzy or fainting spells. Alcohol may interfere with the effect of this medicine. Avoid alcoholic drinks. This medicine can reduce the response  of your body to heat or cold. Dress warm in cold weather and stay hydrated in hot weather. If possible, avoid extreme temperatures like saunas, hot tubs, very hot or cold showers, or activities that can cause dehydration such as vigorous exercise. This medicine can make you more sensitive to the sun. Keep out of the sun. If you cannot avoid being in the sun, wear protective clothing and use sunscreen. Do not use sun lamps or tanning beds/booths. Your mouth may get dry. Chewing sugarless gum or sucking hard candy, and drinking plenty of water may help. Contact your doctor if the problem does not go away or is severe. What side effects may I notice from receiving this medicine? Side effects that you should report to your doctor or health care professional as soon as possible: -blurred vision -breast enlargement in men or women -breast milk in women who are not breast-feeding -chest pain, fast or irregular heartbeat -confusion, restlessness -dark yellow or brown urine -difficulty breathing or swallowing -dizziness or fainting spells -drooling, shaking, movement difficulty (shuffling walk) or rigidity -fever, chills, sore throat -involuntary or uncontrollable movements of the eyes, mouth, head, arms, and legs -seizures -stomach  area pain -unusually weak or tired -unusual bleeding or bruising -yellowing of skin or eyes Side effects that usually do not require medical attention (report to your doctor or health care professional if they continue or are bothersome): -difficulty passing urine -difficulty sleeping -headache -sexual dysfunction -skin rash, or itching This list may not describe all possible side effects. Call your doctor for medical advice about side effects. You may report side effects to FDA at 1-800-FDA-1088. Where should I keep my medicine? Keep out of the reach of children. Store at room temperature between 15 and 30 degrees C (59 and 86 degrees F). Protect from light. Throw  away any unused medicine after the expiration date. NOTE: This sheet is a summary. It may not cover all possible information. If you have questions about this medicine, talk to your doctor, pharmacist, or health care provider.  2015, [employer] Standard. (2011-06-13 16:59:39)

## 2014-09-24 ENCOUNTER — Other Ambulatory Visit (HOSPITAL_COMMUNITY): Payer: Self-pay

## 2014-09-24 MED ORDER — ALPRAZOLAM 0.5 MG PO TABS: ORAL_TABLET | ORAL | Status: DC

## 2014-09-28 ENCOUNTER — Ambulatory Visit (HOSPITAL_COMMUNITY): Payer: 59 | Admitting: Hematology & Oncology

## 2014-09-28 ENCOUNTER — Encounter (HOSPITAL_COMMUNITY): Payer: 59

## 2014-09-28 ENCOUNTER — Encounter (HOSPITAL_COMMUNITY): Payer: Self-pay | Admitting: Hematology & Oncology

## 2014-09-28 ENCOUNTER — Encounter (HOSPITAL_BASED_OUTPATIENT_CLINIC_OR_DEPARTMENT_OTHER): Payer: 59 | Admitting: Hematology & Oncology

## 2014-09-28 VITALS — BP 165/81 | HR 94 | Temp 98.6°F | Resp 18 | Wt 339.0 lb

## 2014-09-28 DIAGNOSIS — C9000 Multiple myeloma not having achieved remission: Secondary | ICD-10-CM

## 2014-09-28 MED ORDER — DIAZEPAM 5 MG PO TABS
ORAL_TABLET | ORAL | Status: DC
Start: 2014-09-28 — End: 2015-06-03

## 2014-09-28 NOTE — Progress Notes (Signed)
Consent obtained for Velcade, Revlimid, and Dexamethasone. Distress screening done and it was a 0. Revlimid is in pre-auth state currently. Patient tentatively set up to start chemo on Monday 10/05/14.

## 2014-09-28 NOTE — Progress Notes (Signed)
[facility] Hematology/Oncology Consultation   Name: Kelli Hurley      MRN: [id number]    REFERRING PHYSICIAN:  Dr. Charlotte Sanes  REASON FOR CONSULT:  Anemia, leukopenia, and cryglobulinemia   DIAGNOSIS:  Multiple Myeloma IgG kappa, STAGE II, serum albumin 3.5 g/dl IgG of 04/14/2007 MG/DL, total M spike of 2.7 g/DL Beta-2 microglobulin of 3.7 MG/L Severe Anemia BMBX with 18% monoclonal plasma cells, kappa light chain restsricted. Cytogenetics with 13q-, 17p- (high risk disease) ESR 138 mm/hr Urine with kappa/lambda ratio 5.11, IgG monoclonal protein with kappa light chain specificity by immunofixation only Myeloma survey on 08/07/2014 with no lytic lesions noted   HISTORY OF PRESENT ILLNESS:   Kelli Hurley is a 59 year old black American woman with a past medical history significant for HTN and obesity who was referred to [facility] for anemia, leukopenia, and cryglobulinemia.  She has been diagnosed with multiple myeloma. The patient is here today for additional discussion of her disease and therapy. She has family were provided with reading information at the last visit. She presents today with her daughters.   PAST MEDICAL HISTORY:   Past Medical History  Diagnosis Date  . Hypertension   . Anemia   . Normocytic hypochromic anemia 08/03/2014  . Leukopenia 08/03/2014  . Claustrophobia 10/05/2014    ALLERGIES: No Known Allergies    MEDICATIONS: I have reviewed the patient's current medications.    Current Outpatient Prescriptions on File Prior to Visit  Medication Sig Dispense Refill  . hydrochlorothiazide (HYDRODIURIL) 25 MG tablet Take 25 mg by mouth every morning.    Marland Kitchen ALPRAZolam (XANAX) 0.5 MG tablet Take 2 tablets 1 hour prior to PET scan. Then take 1 tab in waiting room if needed. 5 tablet 0  . aspirin 325 MG tablet Take 325 mg by mouth daily.    . Bortezomib (VELCADE IJ) Inject as directed. To begin on Days 1, 4, 8, & 11 every 21 days. To  start approximately 10/02/14    . diazepam (VALIUM) 5 MG tablet Take 1 tab 1 hour prior to procedure. Then take 1-2 tablets when you arrive if needed. (Patient not taking: Reported on 10/05/2014) 5 tablet 0   Current Facility-Administered Medications on File Prior to Visit  Medication Dose Route Frequency Provider Last Rate Last Dose  . heparin lock flush 100 unit/mL  500 Units Intravenous Once Patrici Ranks, MD      . methylPREDNISolone sodium succinate (SOLU-MEDROL) 125 mg/2 mL injection 60 mg  60 mg Intravenous Once Patrici Ranks, MD      . sodium chloride 0.9 % injection 10 mL  10 mL Intravenous Once Patrici Ranks, MD         PAST SURGICAL HISTORY Past Surgical History  Procedure Laterality Date  . Abdominal hysterectomy    . Other surgical history      heart surgery as infant to "repair hole in heart"    FAMILY HISTORY: Family History  Problem Relation Age of Onset  . Cancer Mother   . Hypertension Mother   . Cancer Father   . Hypertension Father   . Cancer Maternal Grandmother     SOCIAL HISTORY: She reports that she used to smoke as a teenager, but quit > 40 years ago.  She denies EtOH abuse.  She denies illicit drug abuse.  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  She live byherself.  She is divorced with two daughters ages 22 and 42 who are healthy.  Her grandbaby is healthy too.  She is Psychologist, forensic.  PERFORMANCE STATUS: The patient's performance status is 1 - Symptomatic but completely ambulatory  PHYSICAL EXAM: Most Recent Vital Signs: Blood pressure 165/81, pulse 94, temperature 98.6 F (37 C), resp. rate 18, weight 339 lb (153.769 kg), SpO2 99 %. General appearance: alert, cooperative, appears stated age, no distress and morbidly obese Head: Normocephalic, without obvious abnormality, atraumatic Eyes: conjunctivae/corneas clear. PERRL, EOM's intact. Fundi benign. Throat: lips, mucosa, and  tongue normal; teeth and gums normal Neck: no adenopathy, supple, symmetrical, trachea midline and thyroid not enlarged, symmetric, no tenderness/mass/nodules Back: symmetric, no curvature. ROM normal. No CVA tenderness. Lungs: clear to auscultation bilaterally and normal percussion bilaterally Heart: regular rate and rhythm, S1, S2 normal, no murmur, click, rub or gallop Abdomen: soft, non-tender; bowel sounds normal; no masses,  no organomegaly Extremities: extremities normal, atraumatic, no cyanosis or edema Skin: Skin color, texture, turgor normal. No rashes or lesions Lymph nodes: Cervical, supraclavicular, and axillary nodes normal. Neurologic: Alert and oriented X 3, normal strength and tone. Normal symmetric reflexes. Normal coordination and gait  LABORATORY DATA:  No results found for this or any previous visit (from the past 48 hour(s)).   I have reviewed the laboratory data below:  Results for Kelli Hurley, Kelli Hurley (MRN [id number])   Ref. Range 09/03/2014 09:30  WBC Latest Ref Range: 4.0-10.5 K/uL 3.4 (L)  RBC Latest Ref Range: 3.87-5.11 MIL/uL 3.86 (L)  Hemoglobin Latest Ref Range: 12.0-15.0 g/dL 9.4 (L)  HCT Latest Ref Range: 36.0-46.0 % 30.8 (L)  MCV Latest Ref Range: 78.0-100.0 fL 79.8  MCH Latest Ref Range: 26.0-34.0 pg 24.4 (L)  MCHC Latest Ref Range: 30.0-36.0 g/dL 30.5  RDW Latest Ref Range: 11.5-15.5 % 19.0 (H)  Platelets Latest Ref Range: 150-400 K/uL 186     RADIOGRAPHY:  CLINICAL DATA: Normocytic hypochromic anemia. Leukopenia.  EXAM: METASTATIC BONE SURVEY  COMPARISON: None.  FINDINGS: Multilevel degenerative disc disease is noted in the cervical and thoracic spine. Severe degenerative disc disease is noted in both knees. No areas of abnormal sclerosis or lytic destruction are seen in the visualized skeleton.  IMPRESSION: No lytic lesions are noted in the visualized skeleton.   Electronically Signed  By: Marijo Conception, M.D.  On:  08/07/2014 12:33   PATHOLOGY:  None   ASSESSMENT/PLAN:  Multiple Myeloma IgG kappa, STAGE II, serum albumin 3.5 g/dl IgG of 04/14/2007 MG/DL, total M spike of 2.7 g/DL Beta-2 microglobulin of 3.7 MG/L Severe Anemia BMBX with 18% monoclonal plasma cells, kappa light chain restsricted. Cytogenetics with 13q-, 17p- (high risk disease) ESR 138 mm/hr Urine with kappa/lambda ratio 5.11, IgG monoclonal protein with kappa light chain specificity by immunofixation only Myeloma survey on 08/07/2014 with no lytic lesions noted  I addressed all of the patient and family's questions regarding multiple myeloma. They had questions regarding the possibility of a bone marrow transplant. We did discuss referral to an academic center of their trace moving forward. Plan is for initiation of RVD. She is due for PET imaging. She was unable to complete her PET/CT secondary to anxiety, I have given her a prescription for Valium to take prior. I will plan on seeing her back after pet imaging to discuss the results and we will begin to move forward with therapy.  All questions were answered. The patient knows to call the clinic with any problems, questions  or concerns. We can certainly see the patient much sooner if necessary.  This document serves as a record of services personally performed by Ancil Linsey, MD. It was created on her behalf by Arlyce Harman, a trained medical scribe. The creation of this record is based on the scribe's personal observations and the provider's statements to them. This document has been checked and approved by the attending provider.  I have reviewed the above documentation for accuracy and completeness, and I agree with the above.  This note is electronically signed by: Molli Hazard, MD  10/25/2014 7:09 PM

## 2014-09-28 NOTE — Patient Instructions (Signed)
[place] at [facility] Discharge Instructions  RECOMMENDATIONS MADE BY THE CONSULTANT AND ANY TEST RESULTS WILL BE SENT TO YOUR REFERRING PHYSICIAN.  Start your Acyclovir on Saturday  Middle of your treatment we will get you over to [facility]  No Zometa right now but if there is any indication that you have cancer in your bone, we will start Zometa.   After bone marrow transplant, you will stay in hospital for 2-4 weeks.  [facility] will watch you for 3 months after your transplant.  Take your Aspirin 325mg  daily.  Thank you for choosing [place] at [facility] to provide your oncology and hematology care.  To afford each patient quality time with our provider, please arrive at least 15 minutes before your scheduled appointment time.    You need to re-schedule your appointment should you arrive 10 or more minutes late.  We strive to give you quality time with our providers, and arriving late affects you and other patients whose appointments are after yours.  Also, if you no show three or more times for appointments you may be dismissed from the clinic at the providers discretion.     Again, thank you for choosing [facility].  Our hope is that these requests will decrease the amount of time that you wait before being seen by our physicians.       _____________________________________________________________  Should you have questions after your visit to [facility], please contact our office at [phone] between the hours of 8:30 a.m. and 4:30 p.m.  Voicemails left after 4:30 p.m. will not be returned until the following business day.  For prescription refill requests, have your pharmacy contact our office.

## 2014-09-30 ENCOUNTER — Other Ambulatory Visit (HOSPITAL_COMMUNITY): Payer: Self-pay | Admitting: Hematology & Oncology

## 2014-09-30 ENCOUNTER — Other Ambulatory Visit (HOSPITAL_COMMUNITY): Payer: Self-pay | Admitting: Oncology

## 2014-09-30 ENCOUNTER — Ambulatory Visit (HOSPITAL_COMMUNITY)
Admission: RE | Admit: 2014-09-30 | Discharge: 2014-09-30 | Disposition: A | Discharge status: Home / Self Care | Payer: 59 | Source: Ambulatory Visit | Attending: Hematology & Oncology | Admitting: Hematology & Oncology

## 2014-09-30 DIAGNOSIS — C9000 Multiple myeloma not having achieved remission: Secondary | ICD-10-CM

## 2014-09-30 LAB — GLUCOSE, CAPILLARY: Glucose-Capillary: 110 mg/dL — ABNORMAL HIGH (ref 65–99)

## 2014-09-30 MED ORDER — METHYLPREDNISOLONE SODIUM SUCC 125 MG IJ SOLR: 60.0000 mg | Freq: Once | INTRAMUSCULAR | Status: DC

## 2014-09-30 MED ORDER — HEPARIN SOD (PORK) LOCK FLUSH 100 UNIT/ML IV SOLN: 500.0000 [IU] | Freq: Once | INTRAVENOUS | Status: AC

## 2014-09-30 MED ORDER — SODIUM CHLORIDE 0.9 % IJ SOLN: 10.0000 mL | Freq: Once | INTRAMUSCULAR | Status: AC

## 2014-09-30 MED ORDER — FLUDEOXYGLUCOSE F - 18 (FDG) INJECTION
17.0000 | Freq: Once | INTRAVENOUS | Status: DC | PRN
  Administered 2014-09-30: 17 via INTRAVENOUS
  Filled 2014-09-30: qty 17

## 2014-09-30 NOTE — Addendum Note (Signed)
Addended by: Gerhard Perches on: 09/30/2014 10:54 AM   Modules accepted: Orders

## 2014-10-05 ENCOUNTER — Encounter (HOSPITAL_BASED_OUTPATIENT_CLINIC_OR_DEPARTMENT_OTHER): Payer: 59

## 2014-10-05 ENCOUNTER — Encounter (HOSPITAL_COMMUNITY): Payer: Self-pay | Admitting: Oncology

## 2014-10-05 ENCOUNTER — Encounter (HOSPITAL_BASED_OUTPATIENT_CLINIC_OR_DEPARTMENT_OTHER): Payer: 59 | Admitting: Oncology

## 2014-10-05 VITALS — BP 114/85 | HR 88 | Temp 98.4°F | Resp 16 | Wt 340.6 lb

## 2014-10-05 DIAGNOSIS — C9000 Multiple myeloma not having achieved remission: Secondary | ICD-10-CM | POA: Diagnosis not present

## 2014-10-05 DIAGNOSIS — F40240 Claustrophobia: Secondary | ICD-10-CM

## 2014-10-05 DIAGNOSIS — Z5112 Encounter for antineoplastic immunotherapy: Secondary | ICD-10-CM | POA: Diagnosis not present

## 2014-10-05 HISTORY — DX: Claustrophobia: F40.240

## 2014-10-05 LAB — CBC WITH DIFFERENTIAL/PLATELET
BASOS ABS: 0 10*3/uL (ref 0.0–0.1)
BASOS PCT: 0 % (ref 0–1)
EOS ABS: 0 10*3/uL (ref 0.0–0.7)
EOS PCT: 1 % (ref 0–5)
HCT: 26.8 % — ABNORMAL LOW (ref 36.0–46.0)
Hemoglobin: 8.3 g/dL — ABNORMAL LOW (ref 12.0–15.0)
Lymphocytes Relative: 41 % (ref 12–46)
Lymphs Abs: 1.4 10*3/uL (ref 0.7–4.0)
MCH: 24.3 pg — ABNORMAL LOW (ref 26.0–34.0)
MCHC: 31 g/dL (ref 30.0–36.0)
MCV: 78.6 fL (ref 78.0–100.0)
MONO ABS: 0.1 10*3/uL (ref 0.1–1.0)
Monocytes Relative: 4 % (ref 3–12)
NEUTROS PCT: 54 % (ref 43–77)
Neutro Abs: 1.8 10*3/uL (ref 1.7–7.7)
PLATELETS: 184 10*3/uL (ref 150–400)
RBC: 3.41 MIL/uL — ABNORMAL LOW (ref 3.87–5.11)
RDW: 19.3 % — AB (ref 11.5–15.5)
WBC: 3.3 10*3/uL — ABNORMAL LOW (ref 4.0–10.5)

## 2014-10-05 LAB — COMPREHENSIVE METABOLIC PANEL
ALBUMIN: 3.6 g/dL (ref 3.5–5.0)
ALT: 13 U/L — ABNORMAL LOW (ref 14–54)
AST: 21 U/L (ref 15–41)
Alkaline Phosphatase: 55 U/L (ref 38–126)
Anion gap: 8 (ref 5–15)
BUN: 18 mg/dL (ref 6–20)
CHLORIDE: 101 mmol/L (ref 101–111)
CO2: 26 mmol/L (ref 22–32)
Calcium: 8.8 mg/dL — ABNORMAL LOW (ref 8.9–10.3)
Creatinine, Ser: 0.96 mg/dL (ref 0.44–1.00)
GFR calc Af Amer: >60 mL/min (ref >60)
GLUCOSE: 116 mg/dL — AB (ref 65–99)
POTASSIUM: 3.4 mmol/L — AB (ref 3.5–5.1)
Sodium: 135 mmol/L (ref 135–145)
Total Bilirubin: 0.5 mg/dL (ref 0.3–1.2)
Total Protein: 9.3 g/dL — ABNORMAL HIGH (ref 6.5–8.1)

## 2014-10-05 LAB — C-REACTIVE PROTEIN: CRP: 1 mg/dL — ABNORMAL HIGH (ref <1.0)

## 2014-10-05 LAB — SEDIMENTATION RATE: Sed Rate: 122 mm/hr — ABNORMAL HIGH (ref 0–22)

## 2014-10-05 LAB — LACTATE DEHYDROGENASE: LDH: 142 U/L (ref 98–192)

## 2014-10-05 MED ORDER — BORTEZOMIB CHEMO SQ INJECTION 3.5 MG (2.5MG/ML)
1.3000 mg/m2 | Freq: Once | INTRAMUSCULAR | Status: AC
  Administered 2014-10-05: 3.25 mg via SUBCUTANEOUS
  Filled 2014-10-05: qty 3.25

## 2014-10-05 MED ORDER — ONDANSETRON HCL 4 MG PO TABS
8.0000 mg | ORAL_TABLET | Freq: Once | ORAL | Status: AC
  Administered 2014-10-05: 8 mg via ORAL
  Filled 2014-10-05: qty 2

## 2014-10-05 NOTE — Progress Notes (Signed)
Kelli Hurley presents today for injection per MD orders. Velcade administered SQ in right Abdomen. Administration without incident. Patient tolerated well.

## 2014-10-05 NOTE — Assessment & Plan Note (Signed)
IgG multiple myeloma, beginning RVD therapy today, 10/05/2014.    Oncology history developed.  Chemotherapy teaching complete and consent ascertained by nursing staff.  Labs today: CBC diff, CMET, LDH, ESR, CRP, MM panel B2M  Return for therapy as planned.  Return for follow-up in 2 weeks with ordered labs.

## 2014-10-05 NOTE — Patient Instructions (Signed)
..  [place] at [facility] Discharge Instructions  RECOMMENDATIONS MADE BY THE CONSULTANT AND ANY TEST RESULTS WILL BE SENT TO YOUR REFERRING PHYSICIAN.  Treatment today F/U as planned  Thank you for choosing [place] at [facility] to provide your oncology and hematology care.  To afford each patient quality time with our provider, please arrive at least 15 minutes before your scheduled appointment time.    You need to re-schedule your appointment should you arrive 10 or more minutes late.  We strive to give you quality time with our providers, and arriving late affects you and other patients whose appointments are after yours.  Also, if you no show three or more times for appointments you may be dismissed from the clinic at the providers discretion.     Again, thank you for choosing [facility].  Our hope is that these requests will decrease the amount of time that you wait before being seen by our physicians.       _____________________________________________________________  Should you have questions after your visit to [facility], please contact our office at [phone] between the hours of 8:30 a.m. and 4:30 p.m.  Voicemails left after 4:30 p.m. will not be returned until the following business day.  For prescription refill requests, have your pharmacy contact our office.

## 2014-10-05 NOTE — Progress Notes (Signed)
[name], PA-C [address] [place] [address]  Multiple myeloma  Claustrophobia  CURRENT THERAPY: Beginning RVD today, 10/05/2014.  INTERVAL HISTORY: Kelli Hurley 59 y.o. female returns for followup of IgG multiple myeloma.    Multiple myeloma   08/07/2014 Imaging Bone Survey- No lytic lesions are noted in the visualized skeleton.   09/03/2014 Bone Marrow Transplant Bone Marrow, Aspirate,Biopsy, and Clot - NORMOCELLULAR BONE MARROW FOR AGE WITH PLASMA CELL NEOPLASM.  The plasma cell component is increased in the marrow representing an estimated 18% of all cells.   09/23/2014 Initial Diagnosis Multiple myeloma   09/30/2014 PET scan No abnormal hypermetabolism in the neck, chest, abdomen or pelvis.   10/05/2014 -  Chemotherapy RVD     I discussed general risks and side effects of RVD therapy with the patient.  I reviewed the treatment plan in detail with her as well.  I personally reviewed and went over laboratory results with the patient.  The results are noted within this dictation.  I personally reviewed and went over pathology results with the patient.  I personally reviewed and went over radiographic studies with the patient.  The results are noted within this dictation.  PET scan from last week is negative for any hypermetabolism.  I reviewed the treatment plan to the patient and major side effects associated with this treatment plan including, but not limited to, PN, nausea, vomiting, tiredness and fatigue, increased risk of infection, and cytopenias.  She is encouraged to follow excellent hand washing recommendations.   Past Medical History  Diagnosis Date  . Hypertension   . Anemia   . Normocytic hypochromic anemia 08/03/2014  . Leukopenia 08/03/2014  . Claustrophobia 10/05/2014    has Primary osteoarthritis of both knees; Normocytic hypochromic anemia; Leukopenia; Multiple myeloma; and Claustrophobia on her problem list.     has No  Known Allergies.  Current Outpatient Prescriptions on File Prior to Visit  Medication Sig Dispense Refill  . acetaminophen (TYLENOL) 325 MG tablet Take 650 mg by mouth every 6 (six) hours as needed for mild pain.     Marland Kitchen aspirin 325 MG tablet Take 325 mg by mouth daily.    . Bortezomib (VELCADE IJ) Inject as directed. To begin on Days 1, 4, 8, & 11 every 21 days. To start approximately 10/02/14    . hydrochlorothiazide (HYDRODIURIL) 25 MG tablet Take 25 mg by mouth every morning.    Marland Kitchen ALPRAZolam (XANAX) 0.5 MG tablet Take 2 tablets 1 hour prior to PET scan. Then take 1 tab in waiting room if needed. (Patient not taking: Reported on 09/28/2014) 5 tablet 0  . diazepam (VALIUM) 5 MG tablet Take 1 tab 1 hour prior to procedure. Then take 1-2 tablets when you arrive if needed. (Patient not taking: Reported on 10/05/2014) 5 tablet 0   Current Facility-Administered Medications on File Prior to Visit  Medication Dose Route Frequency Provider Last Rate Last Dose  . bortezomib SQ (VELCADE) chemo injection 3.25 mg  1.3 mg/m2 (Treatment Plan Actual) Subcutaneous Once Patrici Ranks, MD      . fludeoxyglucose F - 18 (FDG) injection [place] Intravenous Once PRN Medication Radiologist, MD   [place] at 09/30/14 1300  . heparin lock flush 100 unit/mL  500 Units Intravenous Once Patrici Ranks, MD      . methylPREDNISolone sodium succinate (SOLU-MEDROL) 125 mg/2 mL injection 60 mg  60 mg  Intravenous Once Patrici Ranks, MD      . ondansetron [facility]) tablet 8 mg  8 mg Oral Once Patrici Ranks, MD      . sodium chloride 0.9 % injection 10 mL  10 mL Intravenous Once Patrici Ranks, MD        Past Surgical History  Procedure Laterality Date  . Abdominal hysterectomy    . Other surgical history      heart surgery as infant to "repair hole in heart"    Denies any headaches, dizziness, double vision, fevers, chills, night sweats, nausea, vomiting, diarrhea, constipation,  chest pain, heart palpitations, shortness of breath, blood in stool, black tarry stool, urinary pain, urinary burning, urinary frequency, hematuria.   PHYSICAL EXAMINATION  ECOG PERFORMANCE STATUS: 1 - Symptomatic but completely ambulatory  Filed Vitals:   10/05/14 0927  BP: 114/85  Pulse: 88  Temp: 98.4 F (36.9 C)  Resp: 16    GENERAL:alert, no distress, well developed, comfortable, cooperative, obese and accompanied by daughter. SKIN: skin color, texture, turgor are normal, no rashes or significant lesions HEAD: Normocephalic, No masses, lesions, tenderness or abnormalities EYES: normal, PERRLA, EOMI, Conjunctiva are pink and non-injected EARS: External ears normal OROPHARYNX:lips, buccal mucosa, and tongue normal and mucous membranes are moist  NECK: supple, no adenopathy, thyroid normal size, non-tender, without nodularity, trachea midline LYMPH:  no palpable lymphadenopathy BREAST:not examined LUNGS: clear to auscultation  HEART: regular rate & rhythm, no murmurs and no gallops ABDOMEN:abdomen soft, non-tender, obese and normal bowel sounds BACK: Back symmetric, no curvature. EXTREMITIES:less then 2 second capillary refill, no joint deformities, effusion, or inflammation, no skin discoloration, no clubbing, no cyanosis  NEURO: alert & oriented x 3 with fluent speech, no focal motor/sensory deficits, gait normal    LABORATORY DATA: CBC    Component Value Date/Time   WBC 3.3* 10/05/2014 0914   RBC 3.41* 10/05/2014 0914   RBC 3.63* 08/03/2014 1546   HGB 8.3* 10/05/2014 0914   HCT 26.8* 10/05/2014 0914   PLT 184 10/05/2014 0914   MCV 78.6 10/05/2014 0914   MCH 24.3* 10/05/2014 0914   MCHC 31.0 10/05/2014 0914   RDW 19.3* 10/05/2014 0914   LYMPHSABS 1.4 10/05/2014 0914   MONOABS 0.1 10/05/2014 0914   EOSABS 0.0 10/05/2014 0914   BASOSABS 0.0 10/05/2014 0914      Chemistry      Component Value Date/Time   NA 135 10/05/2014 0914   K 3.4* 10/05/2014 0914   CL  101 10/05/2014 0914   CO2 26 10/05/2014 0914   BUN 18 10/05/2014 0914   CREATININE 0.96 10/05/2014 0914      Component Value Date/Time   CALCIUM 8.8* 10/05/2014 0914   ALKPHOS 55 10/05/2014 0914   AST 21 10/05/2014 0914   ALT 13* 10/05/2014 0914   BILITOT 0.5 10/05/2014 0914        PENDING LABS:   RADIOGRAPHIC STUDIES:  Nm Pet Image Initial (pi) Skull Base To Thigh  09/30/2014   CLINICAL DATA:  Initial treatment strategy for multiple myeloma.  EXAM: NUCLEAR MEDICINE PET SKULL BASE TO THIGH  TECHNIQUE: 17.0 mCi F-18 FDG was injected intravenously. Full-ring PET imaging was performed from the skull base to thigh after the radiotracer. CT data was obtained and used for attenuation correction and anatomic localization.  FASTING BLOOD GLUCOSE:  Value: 110 mg/dl  COMPARISON:  None.  FINDINGS: NECK  No hypermetabolic lymph nodes in the neck. CT images show no acute findings.  CHEST  No hypermetabolic mediastinal, hilar or axillary lymph nodes. No hypermetabolic pulmonary nodules. CT images show no pericardial or pleural effusion. Probable subpleural lymph node along the minor fissure. Image quality is markedly degraded by body habitus and respiratory motion.  ABDOMEN/PELVIS  No abnormal hypermetabolic activity in the liver, adrenal glands, spleen or pancreas. No hypermetabolic lymph nodes. CT images show the liver, gallbladder, adrenal gland, kidneys, spleen, pancreas, stomach and bowel to be grossly unremarkable. No free fluid. Small periumbilical hernia contains fat.  SKELETON  No abnormal osseous hypermetabolism.  IMPRESSION: No abnormal hypermetabolism in the neck, chest, abdomen or pelvis.   Electronically Signed   By: Lorin Picket M.D.   On: 09/30/2014 15:17     PATHOLOGY:    ASSESSMENT AND PLAN:  Multiple myeloma IgG multiple myeloma, beginning RVD therapy today, 10/05/2014.    Oncology history developed.  Chemotherapy teaching complete and consent ascertained by nursing  staff.  Labs today: CBC diff, CMET, LDH, ESR, CRP, MM panel B2M  Return for therapy as planned.  Return for follow-up in 2 weeks with ordered labs.    THERAPY PLAN:  Beginning RVD today.  All questions were answered. The patient knows to call the clinic with any problems, questions or concerns. We can certainly see the patient much sooner if necessary.  Patient and plan discussed with Dr. Ancil Linsey and she is in agreement with the aforementioned.   This note is electronically signed by: Robynn Pane, PA-C 10/05/2014 10:06 AM

## 2014-10-05 NOTE — Progress Notes (Signed)
Labs drawn

## 2014-10-06 LAB — MULTIPLE MYELOMA PANEL, SERUM
ALBUMIN SERPL ELPH-MCNC: 3.5 g/dL (ref 2.9–4.4)
ALPHA 1: 0.3 g/dL (ref 0.0–0.4)
ALPHA2 GLOB SERPL ELPH-MCNC: 0.7 g/dL (ref 0.4–1.0)
Albumin/Glob SerPl: 0.7 (ref 0.7–1.7)
B-Globulin SerPl Elph-Mcnc: 1 g/dL (ref 0.7–1.3)
Gamma Glob SerPl Elph-Mcnc: 3.1 g/dL — ABNORMAL HIGH (ref 0.4–1.8)
Globulin, Total: 5.1 g/dL — ABNORMAL HIGH (ref 2.2–3.9)
IGG (IMMUNOGLOBIN G), SERUM: 3809 mg/dL — AB (ref 700–1600)
IGM, SERUM: 35 mg/dL (ref 26–217)
IgA: 52 mg/dL — ABNORMAL LOW (ref 87–352)
M PROTEIN SERPL ELPH-MCNC: 2.7 g/dL — AB
TOTAL PROTEIN ELP: 8.6 g/dL — AB (ref 6.0–8.5)

## 2014-10-06 LAB — KAPPA/LAMBDA LIGHT CHAINS
Kappa free light chain: 110.05 mg/L — ABNORMAL HIGH (ref 3.30–19.40)
Kappa, lambda light chain ratio: 9.94 — ABNORMAL HIGH (ref 0.26–1.65)
LAMDA FREE LIGHT CHAINS: 11.07 mg/L (ref 5.71–26.30)

## 2014-10-06 LAB — BETA 2 MICROGLOBULIN, SERUM: BETA 2 MICROGLOBULIN: 3.7 mg/L — AB (ref 0.6–2.4)

## 2014-10-08 ENCOUNTER — Inpatient Hospital Stay (HOSPITAL_COMMUNITY): Payer: 59

## 2014-10-11 ENCOUNTER — Encounter (HOSPITAL_COMMUNITY): Payer: Self-pay | Admitting: *Deleted

## 2014-10-11 ENCOUNTER — Emergency Department (HOSPITAL_COMMUNITY)
Admission: EM | Admit: 2014-10-11 | Discharge: 2014-10-12 | Disposition: A | Discharge status: Home / Self Care | Payer: Medicare Other | Attending: Emergency Medicine | Admitting: Emergency Medicine

## 2014-10-11 DIAGNOSIS — Z8659 Personal history of other mental and behavioral disorders: Secondary | ICD-10-CM | POA: Insufficient documentation

## 2014-10-11 DIAGNOSIS — R600 Localized edema: Secondary | ICD-10-CM | POA: Insufficient documentation

## 2014-10-11 DIAGNOSIS — Z7982 Long term (current) use of aspirin: Secondary | ICD-10-CM | POA: Diagnosis not present

## 2014-10-11 DIAGNOSIS — D72819 Decreased white blood cell count, unspecified: Secondary | ICD-10-CM | POA: Diagnosis not present

## 2014-10-11 DIAGNOSIS — I10 Essential (primary) hypertension: Secondary | ICD-10-CM | POA: Insufficient documentation

## 2014-10-11 DIAGNOSIS — R21 Rash and other nonspecific skin eruption: Secondary | ICD-10-CM | POA: Insufficient documentation

## 2014-10-11 DIAGNOSIS — R0602 Shortness of breath: Secondary | ICD-10-CM | POA: Insufficient documentation

## 2014-10-11 DIAGNOSIS — R609 Edema, unspecified: Secondary | ICD-10-CM

## 2014-10-11 DIAGNOSIS — R2243 Localized swelling, mass and lump, lower limb, bilateral: Secondary | ICD-10-CM | POA: Diagnosis present

## 2014-10-11 DIAGNOSIS — E876 Hypokalemia: Secondary | ICD-10-CM | POA: Diagnosis not present

## 2014-10-11 DIAGNOSIS — D509 Iron deficiency anemia, unspecified: Secondary | ICD-10-CM | POA: Diagnosis not present

## 2014-10-11 DIAGNOSIS — Z79899 Other long term (current) drug therapy: Secondary | ICD-10-CM | POA: Diagnosis not present

## 2014-10-11 LAB — CBC WITH DIFFERENTIAL/PLATELET
BASOS ABS: 0 10*3/uL (ref 0.0–0.1)
Basophils Relative: 0 % (ref 0–1)
EOS PCT: 2 % (ref 0–5)
Eosinophils Absolute: 0.1 10*3/uL (ref 0.0–0.7)
HCT: 27.3 % — ABNORMAL LOW (ref 36.0–46.0)
Hemoglobin: 8.3 g/dL — ABNORMAL LOW (ref 12.0–15.0)
LYMPHS PCT: 30 % (ref 12–46)
Lymphs Abs: 1 10*3/uL (ref 0.7–4.0)
MCH: 24.3 pg — ABNORMAL LOW (ref 26.0–34.0)
MCHC: 30.4 g/dL (ref 30.0–36.0)
MCV: 80.1 fL (ref 78.0–100.0)
MONO ABS: 0.1 10*3/uL (ref 0.1–1.0)
Monocytes Relative: 4 % (ref 3–12)
Neutro Abs: 2.3 10*3/uL (ref 1.7–7.7)
Neutrophils Relative %: 64 % (ref 43–77)
PLATELETS: 210 10*3/uL (ref 150–400)
RBC: 3.41 MIL/uL — ABNORMAL LOW (ref 3.87–5.11)
RDW: 20.6 % — AB (ref 11.5–15.5)
WBC: 3.5 10*3/uL — ABNORMAL LOW (ref 4.0–10.5)

## 2014-10-11 LAB — COMPREHENSIVE METABOLIC PANEL
ALBUMIN: 3.6 g/dL (ref 3.5–5.0)
ALK PHOS: 56 U/L (ref 38–126)
ALT: 16 U/L (ref 14–54)
AST: 20 U/L (ref 15–41)
Anion gap: 9 (ref 5–15)
BILIRUBIN TOTAL: 0.5 mg/dL (ref 0.3–1.2)
BUN: 18 mg/dL (ref 6–20)
CALCIUM: 9 mg/dL (ref 8.9–10.3)
CO2: 30 mmol/L (ref 22–32)
Chloride: 99 mmol/L — ABNORMAL LOW (ref 101–111)
Creatinine, Ser: 0.96 mg/dL (ref 0.44–1.00)
GFR calc Af Amer: >60 mL/min (ref >60)
GFR calc non Af Amer: >60 mL/min (ref >60)
GLUCOSE: 121 mg/dL — AB (ref 65–99)
POTASSIUM: 2.9 mmol/L — AB (ref 3.5–5.1)
Sodium: 138 mmol/L (ref 135–145)
TOTAL PROTEIN: 8.7 g/dL — AB (ref 6.5–8.1)

## 2014-10-11 MED ORDER — POTASSIUM CHLORIDE 20 MEQ/15ML (10%) PO SOLN: 20.0000 meq | Freq: Once | ORAL | Status: DC

## 2014-10-11 MED ORDER — DIPHENHYDRAMINE HCL 25 MG PO CAPS
50.0000 mg | ORAL_CAPSULE | Freq: Once | ORAL | Status: AC
  Administered 2014-10-11: 50 mg via ORAL
  Filled 2014-10-11: qty 2

## 2014-10-11 MED ORDER — POTASSIUM CHLORIDE CRYS ER 20 MEQ PO TBCR
40.0000 meq | EXTENDED_RELEASE_TABLET | Freq: Once | ORAL | Status: AC
  Administered 2014-10-11: 40 meq via ORAL
  Filled 2014-10-11: qty 2

## 2014-10-11 NOTE — ED Notes (Signed)
Pt c/o bilateral feet swelling that started today

## 2014-10-11 NOTE — ED Provider Notes (Signed)
CSN: [id number]     Arrival date & time 10/11/14  2127 History   First MD Initiated Contact with Patient 10/11/14 2136     Chief Complaint  Patient presents with  . Leg Swelling     (Consider location/radiation/quality/duration/timing/severity/associated sxs/prior Treatment) The history is provided by the patient and a relative.   Kelli Hurley is a 59 y.o. female with a history of multiple myeloma who underwent her first dose of velcade 7 days ago presenting with  Bilateral foot and ankle swelling along with red rash of her lower extremities which family first noticed tonight, although patient reports feeling a mild burning sensation in her bilateral posterior ankles since she woke this am.  She denies fevers, chills, orthopnea,  increased fatigue, cough, or chest pain.  She does endorse baseline sob with exertion which is not worsened today.  She has taken no medicines nor has she found any alleviators, nothing makes her symptoms worse.     Past Medical History  Diagnosis Date  . Hypertension   . Anemia   . Normocytic hypochromic anemia 08/03/2014  . Leukopenia 08/03/2014  . Claustrophobia 10/05/2014   Past Surgical History  Procedure Laterality Date  . Abdominal hysterectomy    . Other surgical history      heart surgery as infant to "repair hole in heart"   Family History  Problem Relation Age of Onset  . Cancer Mother   . Hypertension Mother   . Cancer Father   . Hypertension Father   . Cancer Maternal Grandmother    Social History  Substance Use Topics  . Smoking status: Never Smoker   . Smokeless tobacco: None  . Alcohol Use: No   OB History    No data available     Review of Systems  Constitutional: Negative for fever.  HENT: Negative for congestion and sore throat.   Eyes: Negative.   Respiratory: Negative for cough, chest tightness and wheezing.   Cardiovascular: Negative for chest pain.  Gastrointestinal: Negative for nausea and abdominal pain.   Genitourinary: Negative.   Musculoskeletal: Negative for joint swelling, arthralgias, gait problem and neck pain.  Skin: Positive for rash. Negative for wound.  Neurological: Negative for dizziness, weakness, light-headedness, numbness and headaches.  Psychiatric/Behavioral: Negative.       Allergies  Review of patient's allergies indicates no known allergies.  Home Medications   Prior to Admission medications   Medication Sig Start Date End Date Taking? Authorizing Provider  acyclovir (ZOVIRAX) 400 MG tablet Take 400 mg by mouth 2 (two) times daily.  10/02/14  Yes Historical Provider, MD  aspirin 325 MG tablet Take 325 mg by mouth daily.   Yes Historical Provider, MD  Bortezomib (VELCADE IJ) Inject as directed. To begin on Days 1, 4, 8, & 11 every 21 days. To start approximately 10/02/14   Yes Historical Provider, MD  dexamethasone (DECADRON) 4 MG tablet Take 40 mg by mouth once a week.  10/02/14  Yes Historical Provider, MD  hydrochlorothiazide (HYDRODIURIL) 25 MG tablet Take 25 mg by mouth every morning.   Yes Historical Provider, MD  ondansetron (ZOFRAN) 8 MG tablet Take 8 mg by mouth every 8 (eight) hours as needed for nausea or vomiting.  10/02/14  Yes Historical Provider, MD  prochlorperazine (COMPAZINE) 10 MG tablet Take 10 mg by mouth every 6 (six) hours as needed for nausea or vomiting.  10/02/14  Yes Historical Provider, MD  REVLIMID 25 MG capsule Take 25 mg by mouth daily. Take  14 days on and 7 days off 10/02/14  Yes Historical Provider, MD  vitamin C (ASCORBIC ACID) 500 MG tablet Take 500 mg by mouth daily.   Yes Historical Provider, MD  ALPRAZolam Duanne Moron) 0.5 MG tablet Take 2 tablets 1 hour prior to PET scan. Then take 1 tab in waiting room if needed. Patient not taking: Reported on 09/28/2014 09/24/14   Manon Hilding Kefalas, PA-C  diazepam (VALIUM) 5 MG tablet Take 1 tab 1 hour prior to procedure. Then take 1-2 tablets when you arrive if needed. Patient not taking: Reported on  10/05/2014 09/28/14   Patrici Ranks, MD  potassium chloride 20 MEQ TBCR Take 20 mEq by mouth 2 (two) times daily. 10/12/14   Evalee Jefferson, PA-C   BP 126/67 mmHg  Pulse 84  Temp(Src) 98.3 F (36.8 C) (Oral)  Resp 20  Ht _0  (1.575 m)  Wt 339 lb (153.769 kg)  BMI 61.99 kg/m2  SpO2 98% Physical Exam  Constitutional: She appears well-developed and well-nourished. No distress.  HENT:  Head: Normocephalic.  Neck: Neck supple.  Cardiovascular: Normal rate.   Pulses:      Dorsalis pedis pulses are 2+ on the right side, and 2+ on the left side.  Pulmonary/Chest: Effort normal. No respiratory distress. She has no wheezes. She has no rhonchi. She has no rales.  Abdominal: Soft. There is no tenderness.  Musculoskeletal: Normal range of motion. She exhibits no edema or tenderness.  Bilateral lower extremity edema, 1+ to mid lower legs. Calves are soft, no induration.  No palpable cords.  Skin: Rash noted.  Small patches of blanching erythematous macules bilateral lower legs.    ED Course  Procedures (including critical care time) Labs Review Labs Reviewed  COMPREHENSIVE METABOLIC PANEL - Abnormal; Notable for the following:    Potassium 2.9 (*)    Chloride 99 (*)    Glucose, Bld 121 (*)    Total Protein 8.7 (*)    All other components within normal limits  CBC WITH DIFFERENTIAL/PLATELET - Abnormal; Notable for the following:    WBC 3.5 (*)    RBC 3.41 (*)    Hemoglobin 8.3 (*)    HCT 27.3 (*)    MCH 24.3 (*)    RDW 20.6 (*)    All other components within normal limits      EKG Interpretation   Date/Time:  Monday October 12 2014 01:14:00 EDT Ventricular Rate:  84 PR Interval:  204 QRS Duration: 98 QT Interval:  407 QTC Calculation: 481 R Axis:   78 Text Interpretation:  Sinus rhythm Borderline prolonged PR interval  Probable left atrial enlargement Nonspecific T abnormalities, lateral  leads No old tracing to compare Confirmed by KNAPP  MD-I, IVA ([id number]) on  10/12/2014  1:36:26 AM      MDM   Final diagnoses:  Rash  Peripheral edema  Hypokalemia    Patients labs reviewed.   Results were also discussed with patient.  Potassium supplement given along with prescription for same.  Reviewed potential SE  Profile of Velcade, includes rash, edema, along with multitude of possible side effects.  Advised pt to discuss with her oncologist (scheduled to see in 2 days).  In the interim,  Return here for any worsened sx.  Re-exam - no worsened or spreading rash.  Sx are bilateral, therefore highly unlikely to be a PE.       Evalee Jefferson, PA-C 10/12/14 [place], DO 10/15/14 2153

## 2014-10-11 NOTE — ED Notes (Signed)
Lab into room to draw blood - All staff wearing masks for pt's protection until results of blood work obtained .

## 2014-10-12 DIAGNOSIS — E876 Hypokalemia: Secondary | ICD-10-CM | POA: Diagnosis not present

## 2014-10-12 MED ORDER — POTASSIUM CHLORIDE ER 20 MEQ PO TBCR: 20.0000 meq | EXTENDED_RELEASE_TABLET | Freq: Two times a day (BID) | ORAL | Status: DC

## 2014-10-12 NOTE — Discharge Instructions (Signed)
Edema °Edema is an abnormal buildup of fluids in your body tissues. Edema is somewhat dependent on gravity to pull the fluid to the lowest place in your body. That makes the condition more common in the legs and thighs (lower extremities). Painless swelling of the feet and ankles is common and becomes more likely as you get older. It is also common in looser tissues, like around your eyes.  °When the affected area is squeezed, the fluid may move out of that spot and leave a dent for a few moments. This dent is called pitting.  °CAUSES  °There are many possible causes of edema. Eating too much salt and being on your feet or sitting for a long time can cause edema in your legs and ankles. Hot weather may make edema worse. Common medical causes of edema include: °· Heart failure. °· Liver disease. °· Kidney disease. °· Weak blood vessels in your legs. °· Cancer. °· An injury. °· Pregnancy. °· Some medications. °· Obesity.  °SYMPTOMS  °Edema is usually painless. Your skin may look swollen or shiny.  °DIAGNOSIS  °Your health care provider may be able to diagnose edema by asking about your medical history and doing a physical exam. You may need to have tests such as X-rays, an electrocardiogram, or blood tests to check for medical conditions that may cause edema.  °TREATMENT  °Edema treatment depends on the cause. If you have heart, liver, or kidney disease, you need the treatment appropriate for these conditions. General treatment may include: °· Elevation of the affected body part above the level of your heart. °· Compression of the affected body part. Pressure from elastic bandages or support stockings squeezes the tissues and forces fluid back into the blood vessels. This keeps fluid from entering the tissues. °· Restriction of fluid and salt intake. °· Use of a water pill (diuretic). These medications are appropriate only for some types of edema. They pull fluid out of your body and make you urinate more often. This  gets rid of fluid and reduces swelling, but diuretics can have side effects. Only use diuretics as directed by your health care provider. °HOME CARE INSTRUCTIONS  °· Keep the affected body part above the level of your heart when you are lying down.   °· Do not sit still or stand for prolonged periods.   °· Do not put anything directly under your knees when lying down. °· Do not wear constricting clothing or garters on your upper legs.   °· Exercise your legs to work the fluid back into your blood vessels. This may help the swelling go down.   °· Wear elastic bandages or support stockings to reduce ankle swelling as directed by your health care provider.   °· Eat a low-salt diet to reduce fluid if your health care provider recommends it.   °· Only take medicines as directed by your health care provider.  °SEEK MEDICAL CARE IF:  °· Your edema is not responding to treatment. °· You have heart, liver, or kidney disease and notice symptoms of edema. °· You have edema in your legs that does not improve after elevating them.   °· You have sudden and unexplained weight gain. °SEEK IMMEDIATE MEDICAL CARE IF:  °· You develop shortness of breath or chest pain.   °· You cannot breathe when you lie down. °· You develop pain, redness, or warmth in the swollen areas.   °· You have heart, liver, or kidney disease and suddenly get edema. °· You have a fever and your symptoms suddenly get worse. °MAKE SURE YOU:  °·   Understand these instructions.  Will watch your condition.  Will get help right away if you are not doing well or get worse. Document Released: 01/23/2005 Document Revised: 06/09/2013 Document Reviewed: 11/15/2012 [facility] Patient Information 2015 [place], [place]. This information is not intended to replace advice given to you by your health care provider. Make sure you discuss any questions you have with your health care provider.  Rash A rash is a change in the color or texture of your skin. There are many  different types of rashes. You may have other problems that accompany your rash. CAUSES   Infections.  Allergic reactions. This can include allergies to pets or foods.  Certain medicines.  Exposure to certain chemicals, soaps, or cosmetics.  Heat.  Exposure to poisonous plants.  Tumors, both cancerous and noncancerous. SYMPTOMS   Redness.  Scaly skin.  Itchy skin.  Dry or cracked skin.  Bumps.  Blisters.  Pain. DIAGNOSIS  Your caregiver may do a physical exam to determine what type of rash you have. A skin sample (biopsy) may be taken and examined under a microscope. TREATMENT  Treatment depends on the type of rash you have. Your caregiver may prescribe certain medicines. For serious conditions, you may need to see a skin doctor (dermatologist). HOME CARE INSTRUCTIONS   Avoid the substance that caused your rash.  Do not scratch your rash. This can cause infection.  You may take cool baths to help stop itching.  Only take over-the-counter or prescription medicines as directed by your caregiver.  Keep all follow-up appointments as directed by your caregiver. SEEK IMMEDIATE MEDICAL CARE IF:  You have increasing pain, swelling, or redness.  You have a fever.  You have new or severe symptoms.  You have body aches, diarrhea, or vomiting.  Your rash is not better after 3 days. MAKE SURE YOU:  Understand these instructions.  Will watch your condition.  Will get help right away if you are not doing well or get worse. Document Released: 01/13/2002 Document Revised: 04/17/2011 Document Reviewed: 11/07/2010 [facility] Patient Information 2015 [place], [place]. This information is not intended to replace advice given to you by your health care provider. Make sure you discuss any questions you have with your health care provider.  Hypokalemia Hypokalemia means that the amount of potassium in the blood is lower than normal.Potassium is a chemical, called an  electrolyte, that helps regulate the amount of fluid in the body. It also stimulates muscle contraction and helps nerves function properly.Most of the body's potassium is inside of cells, and only a very small amount is in the blood. Because the amount in the blood is so small, minor changes can be life-threatening. CAUSES  Antibiotics.  Diarrhea or vomiting.  Using laxatives too much, which can cause diarrhea.  Chronic kidney disease.  Water pills (diuretics).  Eating disorders (bulimia).  Low magnesium level.  Sweating a lot. SIGNS AND SYMPTOMS  Weakness.  Constipation.  Fatigue.  Muscle cramps.  Mental confusion.  Skipped heartbeats or irregular heartbeat (palpitations).  Tingling or numbness. DIAGNOSIS  Your health care provider can diagnose hypokalemia with blood tests. In addition to checking your potassium level, your health care provider may also check other lab tests. TREATMENT Hypokalemia can be treated with potassium supplements taken by mouth or adjustments in your current medicines. If your potassium level is very low, you may need to get potassium through a vein (IV) and be monitored in the hospital. A diet high in potassium is also helpful. Foods high  in potassium are:  Nuts, such as peanuts and pistachios.  Seeds, such as sunflower seeds and pumpkin seeds.  Peas, lentils, and lima beans.  Whole grain and bran cereals and breads.  Fresh fruit and vegetables, such as apricots, avocado, bananas, cantaloupe, kiwi, oranges, tomatoes, asparagus, and potatoes.  Orange and tomato juices.  Red meats.  Fruit yogurt. HOME CARE INSTRUCTIONS  Take all medicines as prescribed by your health care provider.  Maintain a healthy diet by including nutritious food, such as fruits, vegetables, nuts, whole grains, and lean meats.  If you are taking a laxative, be sure to follow the directions on the label. SEEK MEDICAL CARE IF:  Your weakness gets  worse.  You feel your heart pounding or racing.  You are vomiting or having diarrhea.  You are diabetic and having trouble keeping your blood glucose in the normal range. SEEK IMMEDIATE MEDICAL CARE IF:  You have chest pain, shortness of breath, or dizziness.  You are vomiting or having diarrhea for more than 2 days.  You faint. MAKE SURE YOU:   Understand these instructions.  Will watch your condition.  Will get help right away if you are not doing well or get worse. Document Released: 01/23/2005 Document Revised: 11/13/2012 Document Reviewed: 07/26/2012 [facility] Patient Information 2015 [place], [place]. This information is not intended to replace advice given to you by your health care provider. Make sure you discuss any questions you have with your health care provider.

## 2014-10-12 NOTE — ED Notes (Signed)
Pt alert & oriented x4, stable gait. Patient given discharge instructions, paperwork & prescription(s). Patient verbalized understanding. Pt left department in wheelchair escorted by ED staff. Pt had no further questions.

## 2014-10-13 ENCOUNTER — Encounter (HOSPITAL_COMMUNITY): Payer: Medicare Other | Attending: Oncology

## 2014-10-13 ENCOUNTER — Encounter (HOSPITAL_COMMUNITY): Payer: Medicare Other

## 2014-10-13 VITALS — BP 125/51 | HR 82 | Temp 98.2°F | Resp 20 | Wt 339.0 lb

## 2014-10-13 DIAGNOSIS — Z5112 Encounter for antineoplastic immunotherapy: Secondary | ICD-10-CM

## 2014-10-13 DIAGNOSIS — C9000 Multiple myeloma not having achieved remission: Secondary | ICD-10-CM

## 2014-10-13 DIAGNOSIS — D72819 Decreased white blood cell count, unspecified: Secondary | ICD-10-CM | POA: Insufficient documentation

## 2014-10-13 DIAGNOSIS — D509 Iron deficiency anemia, unspecified: Secondary | ICD-10-CM | POA: Diagnosis not present

## 2014-10-13 LAB — COMPREHENSIVE METABOLIC PANEL
ALBUMIN: 3.4 g/dL — AB (ref 3.5–5.0)
ALK PHOS: 56 U/L (ref 38–126)
ALT: 18 U/L (ref 14–54)
AST: 21 U/L (ref 15–41)
Anion gap: 9 (ref 5–15)
BILIRUBIN TOTAL: 0.6 mg/dL (ref 0.3–1.2)
BUN: 19 mg/dL (ref 6–20)
CALCIUM: 9 mg/dL (ref 8.9–10.3)
CO2: 25 mmol/L (ref 22–32)
Chloride: 99 mmol/L — ABNORMAL LOW (ref 101–111)
Creatinine, Ser: 1 mg/dL (ref 0.44–1.00)
GFR calc Af Amer: >60 mL/min (ref >60)
GFR calc non Af Amer: >60 mL/min (ref >60)
GLUCOSE: 139 mg/dL — AB (ref 65–99)
Potassium: 3.3 mmol/L — ABNORMAL LOW (ref 3.5–5.1)
Sodium: 133 mmol/L — ABNORMAL LOW (ref 135–145)
TOTAL PROTEIN: 9 g/dL — AB (ref 6.5–8.1)

## 2014-10-13 LAB — CBC WITH DIFFERENTIAL/PLATELET
BASOS ABS: 0 10*3/uL (ref 0.0–0.1)
BASOS PCT: 0 % (ref 0–1)
Eosinophils Absolute: 0 10*3/uL (ref 0.0–0.7)
Eosinophils Relative: 0 % (ref 0–5)
HEMATOCRIT: 27.4 % — AB (ref 36.0–46.0)
HEMOGLOBIN: 8.7 g/dL — AB (ref 12.0–15.0)
Lymphocytes Relative: 11 % — ABNORMAL LOW (ref 12–46)
Lymphs Abs: 0.9 10*3/uL (ref 0.7–4.0)
MCH: 25.1 pg — ABNORMAL LOW (ref 26.0–34.0)
MCHC: 31.8 g/dL (ref 30.0–36.0)
MCV: 79 fL (ref 78.0–100.0)
Monocytes Absolute: 0.4 10*3/uL (ref 0.1–1.0)
Monocytes Relative: 4 % (ref 3–12)
NEUTROS ABS: 7.1 10*3/uL (ref 1.7–7.7)
NEUTROS PCT: 85 % — AB (ref 43–77)
Platelets: 266 10*3/uL (ref 150–400)
RBC: 3.47 MIL/uL — ABNORMAL LOW (ref 3.87–5.11)
RDW: 20.6 % — ABNORMAL HIGH (ref 11.5–15.5)
WBC: 8.3 10*3/uL (ref 4.0–10.5)

## 2014-10-13 MED ORDER — BORTEZOMIB CHEMO SQ INJECTION 3.5 MG (2.5MG/ML)
1.3000 mg/m2 | Freq: Once | INTRAMUSCULAR | Status: AC
  Administered 2014-10-13: 3.25 mg via SUBCUTANEOUS
  Filled 2014-10-13: qty 3.25

## 2014-10-13 MED ORDER — ONDANSETRON HCL 4 MG PO TABS
ORAL_TABLET | ORAL | Status: AC
  Filled 2014-10-13: qty 2

## 2014-10-13 MED ORDER — ONDANSETRON HCL 4 MG PO TABS
8.0000 mg | ORAL_TABLET | Freq: Once | ORAL | Status: AC
  Administered 2014-10-13: 8 mg via ORAL

## 2014-10-13 NOTE — Progress Notes (Signed)
Reported to Dr.Penland patient seen in ED for rash on 10/11/14. Patient reports that evening she noted a red rash on her bilat LE's. Denied itching at the areas but stated they "burned". Noted her legs were swollen as well and does not usually have edema as a problem. Reports only change to mention was a different laundry detergent. Denied rash any where else. States the rash is better today and other than the benadryl she was given in the ED she has not taken anything else for the rash. Revlimid started 10/05/14. No other complaints voiced today.

## 2014-10-13 NOTE — Patient Instructions (Signed)
[facility] Discharge Instructions for Patients Receiving Chemotherapy  Today you received the following chemotherapy agents Velcade injection. Per Dr.Penland, if you develop any additional rash anywhere you are to call and speak with Hildred Alamin [phone]). The rash may/may not have been from the initiation of Revlimid so you will need to report to [place] any further symptoms. You are to continue taking the Revlimid pills and Velcade injection.  To help prevent nausea and vomiting after your treatment, we encourage you to take your nausea medication as instructed. If you develop nausea and vomiting that is not controlled by your nausea medication, call the clinic. If it is after clinic hours your family physician or the after hours number for the clinic or go to the Emergency Department.  BELOW ARE SYMPTOMS THAT SHOULD BE REPORTED IMMEDIATELY:  *FEVER GREATER THAN 101.0 F  *CHILLS WITH OR WITHOUT FEVER  NAUSEA AND VOMITING THAT IS NOT CONTROLLED WITH YOUR NAUSEA MEDICATION  *UNUSUAL SHORTNESS OF BREATH  *UNUSUAL BRUISING OR BLEEDING  TENDERNESS IN MOUTH AND THROAT WITH OR WITHOUT PRESENCE OF ULCERS  *URINARY PROBLEMS  *BOWEL PROBLEMS  UNUSUAL RASH Items with * indicate a potential emergency and should be followed up as soon as possible.  Return as scheduled.  I have been informed and understand all the instructions given to me. I know to contact the clinic, my physician, or go to the Emergency Department if any problems should occur. I do not have any questions at this time, but understand that I may call the clinic during office hours or the Patient Navigator at [phone] should I have any questions or need assistance in obtaining follow up care.    __________________________________________  _____________  __________ Signature of Patient or Authorized Representative            Date                   Time    __________________________________________ Nurse's  Signature

## 2014-10-13 NOTE — Progress Notes (Unsigned)
Seen in ED 10/11/14 for Skin rash and lower extremity edema.

## 2014-10-16 ENCOUNTER — Inpatient Hospital Stay (HOSPITAL_COMMUNITY): Payer: 59

## 2014-10-17 ENCOUNTER — Encounter (HOSPITAL_COMMUNITY): Payer: Self-pay | Admitting: Hematology & Oncology

## 2014-10-20 ENCOUNTER — Ambulatory Visit (HOSPITAL_COMMUNITY)
Admission: RE | Admit: 2014-10-20 | Discharge: 2014-10-20 | Disposition: A | Discharge status: Home / Self Care | Payer: Medicare Other | Source: Ambulatory Visit | Attending: Hematology & Oncology | Admitting: Hematology & Oncology

## 2014-10-20 ENCOUNTER — Other Ambulatory Visit (HOSPITAL_COMMUNITY): Payer: Self-pay | Admitting: Oncology

## 2014-10-20 ENCOUNTER — Encounter (HOSPITAL_COMMUNITY): Payer: Medicare Other

## 2014-10-20 ENCOUNTER — Encounter (HOSPITAL_COMMUNITY): Payer: Self-pay | Admitting: Hematology & Oncology

## 2014-10-20 ENCOUNTER — Other Ambulatory Visit (HOSPITAL_COMMUNITY): Payer: 59

## 2014-10-20 ENCOUNTER — Encounter (HOSPITAL_BASED_OUTPATIENT_CLINIC_OR_DEPARTMENT_OTHER): Payer: Medicare Other | Admitting: Hematology & Oncology

## 2014-10-20 ENCOUNTER — Encounter (HOSPITAL_BASED_OUTPATIENT_CLINIC_OR_DEPARTMENT_OTHER): Payer: Medicare Other

## 2014-10-20 VITALS — BP 150/68 | HR 91 | Temp 98.7°F | Resp 22 | Wt 340.2 lb

## 2014-10-20 DIAGNOSIS — R609 Edema, unspecified: Secondary | ICD-10-CM

## 2014-10-20 DIAGNOSIS — D649 Anemia, unspecified: Secondary | ICD-10-CM

## 2014-10-20 DIAGNOSIS — C9000 Multiple myeloma not having achieved remission: Secondary | ICD-10-CM | POA: Diagnosis not present

## 2014-10-20 DIAGNOSIS — Z87891 Personal history of nicotine dependence: Secondary | ICD-10-CM | POA: Diagnosis not present

## 2014-10-20 DIAGNOSIS — M7989 Other specified soft tissue disorders: Secondary | ICD-10-CM

## 2014-10-20 DIAGNOSIS — D72819 Decreased white blood cell count, unspecified: Secondary | ICD-10-CM | POA: Diagnosis not present

## 2014-10-20 DIAGNOSIS — D509 Iron deficiency anemia, unspecified: Secondary | ICD-10-CM | POA: Diagnosis not present

## 2014-10-20 DIAGNOSIS — R600 Localized edema: Secondary | ICD-10-CM | POA: Insufficient documentation

## 2014-10-20 LAB — COMPREHENSIVE METABOLIC PANEL
ALK PHOS: 55 U/L (ref 38–126)
ALT: 20 U/L (ref 14–54)
AST: 29 U/L (ref 15–41)
Albumin: 3.5 g/dL (ref 3.5–5.0)
Anion gap: 12 (ref 5–15)
BUN: 19 mg/dL (ref 6–20)
CHLORIDE: 102 mmol/L (ref 101–111)
CO2: 20 mmol/L — AB (ref 22–32)
CREATININE: 1.02 mg/dL — AB (ref 0.44–1.00)
Calcium: 8.9 mg/dL (ref 8.9–10.3)
GFR calc Af Amer: >60 mL/min (ref >60)
GFR calc non Af Amer: 60 mL/min — ABNORMAL LOW (ref >60)
GLUCOSE: 160 mg/dL — AB (ref 65–99)
Potassium: 3.6 mmol/L (ref 3.5–5.1)
SODIUM: 134 mmol/L — AB (ref 135–145)
Total Bilirubin: 0.5 mg/dL (ref 0.3–1.2)
Total Protein: 8.7 g/dL — ABNORMAL HIGH (ref 6.5–8.1)

## 2014-10-20 LAB — CBC WITH DIFFERENTIAL/PLATELET
BASOS ABS: 0 10*3/uL (ref 0.0–0.1)
Basophils Relative: 0 % (ref 0–1)
EOS ABS: 0 10*3/uL (ref 0.0–0.7)
EOS PCT: 0 % (ref 0–5)
HCT: 28.7 % — ABNORMAL LOW (ref 36.0–46.0)
HEMOGLOBIN: 9.1 g/dL — AB (ref 12.0–15.0)
LYMPHS PCT: 11 % — AB (ref 12–46)
Lymphs Abs: 0.8 10*3/uL (ref 0.7–4.0)
MCH: 25.2 pg — ABNORMAL LOW (ref 26.0–34.0)
MCHC: 31.7 g/dL (ref 30.0–36.0)
MCV: 79.5 fL (ref 78.0–100.0)
Monocytes Absolute: 0.3 10*3/uL (ref 0.1–1.0)
Monocytes Relative: 5 % (ref 3–12)
NEUTROS PCT: 84 % — AB (ref 43–77)
Neutro Abs: 6.1 10*3/uL (ref 1.7–7.7)
PLATELETS: 226 10*3/uL (ref 150–400)
RBC: 3.61 MIL/uL — AB (ref 3.87–5.11)
RDW: 20.9 % — ABNORMAL HIGH (ref 11.5–15.5)
WBC: 7.2 10*3/uL (ref 4.0–10.5)

## 2014-10-20 MED ORDER — BORTEZOMIB CHEMO SQ INJECTION 3.5 MG (2.5MG/ML)
1.3000 mg/m2 | Freq: Once | INTRAMUSCULAR | Status: AC
  Administered 2014-10-20: 3.25 mg via SUBCUTANEOUS
  Filled 2014-10-20: qty 3.25

## 2014-10-20 MED ORDER — ONDANSETRON HCL 4 MG PO TABS
ORAL_TABLET | ORAL | Status: AC
  Filled 2014-10-20: qty 2

## 2014-10-20 MED ORDER — REVLIMID 25 MG PO CAPS: 25.0000 mg | ORAL_CAPSULE | Freq: Every day | ORAL | Status: DC

## 2014-10-20 MED ORDER — ONDANSETRON HCL 4 MG PO TABS
8.0000 mg | ORAL_TABLET | Freq: Once | ORAL | Status: AC
  Administered 2014-10-20: 8 mg via ORAL

## 2014-10-20 NOTE — Patient Instructions (Signed)
[place] at [facility] Discharge Instructions  RECOMMENDATIONS MADE BY THE CONSULTANT AND ANY TEST RESULTS WILL BE SENT TO YOUR REFERRING PHYSICIAN.  Exam per Dr.Penland. Velcade injection given as ordered. Revlimid will be re-ordered. Continue medications as prescribed. Ultrasound right lower leg today at 230 pm due to swelling. Report any issues/concerns as needed. Return as scheduled.  Thank you for choosing [place] at [facility] to provide your oncology and hematology care.  To afford each patient quality time with our provider, please arrive at least 15 minutes before your scheduled appointment time.    You need to re-schedule your appointment should you arrive 10 or more minutes late.  We strive to give you quality time with our providers, and arriving late affects you and other patients whose appointments are after yours.  Also, if you no show three or more times for appointments you may be dismissed from the clinic at the providers discretion.     Again, thank you for choosing [facility] ([place]  Our hope is that these requests will decrease the amount of time that you wait before being seen by our physicians.       _____________________________________________________________  Should you have questions after your visit to [facility], please contact our office at [phone] between the hours of 8:30 a.m. and 4:30 p.m.  Voicemails left after 4:30 p.m. will not be returned until the following business day.  For prescription refill requests, have your pharmacy contact our office.

## 2014-10-20 NOTE — Progress Notes (Signed)
Labs drawn

## 2014-10-20 NOTE — Progress Notes (Signed)
See office visit encounter. 

## 2014-10-20 NOTE — Progress Notes (Signed)
[facility] Hematology/Oncology Progress Note   Name: Kelli Hurley      MRN: [id number]    Date: 10/20/2014 Time:10:53 AM   REFERRING PHYSICIAN:  Dr. Charlotte Sanes  REASON FOR CONSULT:  Anemia, leukopenia, and cryglobulinemia   DIAGNOSIS:  Multiple Myeloma IgG kappa, STAGE II, serum albumin 3.5 g/dl  HISTORY OF PRESENT ILLNESS:   [name] is a 59 year old black American woman with a past medical history significant for HTN and obesity who was referred to [facility] for anemia, leukopenia, and cryglobulinemia. She has been diagnosed with multiple myeloma. She is currently on RVD therapy.  The patient has been experiencing leg edema that comes and goes.  Otherwise, she states that she feels well.  She did have a cold and stuffy nose over the weekend.  She denies fevers, nausea, constipation, vomiting, stress, diarrhea, neuropathy, trouble sleeping or eating.  The patient has no other concerns at this time.   PAST MEDICAL HISTORY:   Past Medical History  Diagnosis Date  . Hypertension   . Anemia   . Normocytic hypochromic anemia 08/03/2014  . Leukopenia 08/03/2014  . Claustrophobia 10/05/2014    ALLERGIES: No Known Allergies    MEDICATIONS: I have reviewed the patient's current medications.    Current Outpatient Prescriptions on File Prior to Visit  Medication Sig Dispense Refill  . acyclovir (ZOVIRAX) 400 MG tablet Take 400 mg by mouth 2 (two) times daily.     Marland Kitchen ALPRAZolam (XANAX) 0.5 MG tablet Take 2 tablets 1 hour prior to PET scan. Then take 1 tab in waiting room if needed. 5 tablet 0  . aspirin 325 MG tablet Take 325 mg by mouth daily.    . Bortezomib (VELCADE IJ) Inject as directed. To begin on Days 1, 4, 8, & 11 every 21 days. To start approximately 10/02/14    . dexamethasone (DECADRON) 4 MG tablet Take 40 mg by mouth once a week.     . hydrochlorothiazide (HYDRODIURIL) 25 MG tablet Take 25 mg by mouth every morning.    . potassium chloride 20 MEQ  TBCR Take 20 mEq by mouth 2 (two) times daily. 14 tablet 0  . REVLIMID 25 MG capsule Take 25 mg by mouth daily. Take 14 days on and 7 days off    . vitamin C (ASCORBIC ACID) 500 MG tablet Take 500 mg by mouth daily.    . diazepam (VALIUM) 5 MG tablet Take 1 tab 1 hour prior to procedure. Then take 1-2 tablets when you arrive if needed. (Patient not taking: Reported on 10/05/2014) 5 tablet 0  . ondansetron (ZOFRAN) 8 MG tablet Take 8 mg by mouth every 8 (eight) hours as needed for nausea or vomiting.     . prochlorperazine (COMPAZINE) 10 MG tablet Take 10 mg by mouth every 6 (six) hours as needed for nausea or vomiting.      Current Facility-Administered Medications on File Prior to Visit  Medication Dose Route Frequency Provider Last Rate Last Dose  . heparin lock flush 100 unit/mL  500 Units Intravenous Once Patrici Ranks, MD      . methylPREDNISolone sodium succinate (SOLU-MEDROL) 125 mg/2 mL injection 60 mg  60 mg Intravenous Once Patrici Ranks, MD      . sodium chloride 0.9 % injection 10 mL  10 mL Intravenous Once Patrici Ranks, MD         PAST SURGICAL HISTORY Past Surgical History  Procedure  Laterality Date  . Abdominal hysterectomy    . Other surgical history      heart surgery as infant to "repair hole in heart"    FAMILY HISTORY: Family History  Problem Relation Age of Onset  . Cancer Mother   . Hypertension Mother   . Cancer Father   . Hypertension Father   . Cancer Maternal Grandmother     SOCIAL HISTORY: She reports that she used to smoke as a teenager, but quit > 40 years ago.  She denies EtOH abuse.  She denies illicit drug abuse.  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  She live byherself.  She is divorced with two daughters ages 62 and 41 who are healthy.  Her grandbaby is healthy too.  She is Psychologist, forensic.  PERFORMANCE STATUS: The patient's performance status is 1 - Symptomatic but  completely ambulatory  PHYSICAL EXAM: Most Recent Vital Signs: Blood pressure 150/68, pulse 91, temperature 98.7 F (37.1 C), temperature source Oral, resp. rate 22, weight 340 lb 3.2 oz (154.314 kg), SpO2 99 %. General appearance: alert, cooperative, appears stated age, no distress and morbidly obese Head: Normocephalic, without obvious abnormality, atraumatic Eyes: conjunctivae/corneas clear. PERRL, EOM's intact. Fundi benign. Throat: lips, mucosa, and tongue normal; teeth and gums normal Neck: no adenopathy, supple, symmetrical, trachea midline and thyroid not enlarged, symmetric, no tenderness/mass/nodules Back: symmetric, no curvature. ROM normal. No CVA tenderness. Lungs: clear to auscultation bilaterally and normal percussion bilaterally Heart: regular rate and rhythm, S1, S2 normal, no murmur, click, rub or gallop Abdomen: soft, non-tender; bowel sounds normal; no masses,  no organomegaly Extremities: extremities normal, atraumatic, no cyanosis edema at the ankles bilaterally. Right lower extremity greater than left lower extremity Skin: Skin color, texture, turgor normal. No rashes or lesions Lymph nodes: Cervical, supraclavicular, and axillary nodes normal. Neurologic: Alert and oriented X 3, normal strength and tone. Normal symmetric reflexes. Normal coordination and gait  LABORATORY DATA:  I have reviewed the data as listed.  Bone Marrow BiopsyS Diagnosis Bone Marrow, Aspirate,Biopsy, and Clot - NORMOCELLULAR BONE MARROW FOR AGE WITH PLASMA CELL NEOPLASM. - TRILINEAGE HEMATOPOIESIS. - SEE COMMENT. PERIPHERAL BLOOD: - NORMOCYTIC-NORMOCHROMIC ANEMIA - LEUKOPENIA Diagnosis Note Despite limited aspirate material, the plasma cell component is increased in the marrow representing an estimated 18% of all cells with associated atypical cytomorphologic features. Immunohistochemical stains show the the plasma cells are kappa light chain-restricted consistent with plasma cell  neoplasm. The background shows trilineage hematopoiesis with non specific changes. Correlation with cytogenetic and FISH studies is recommended. (BNS:ds/gt, 09/04/14) Susanne Greenhouse MD Pathologist, Electronic Signature (Case signed 09/07/2014)   RADIOGRAPHY: I have reviewed the images below and agree with the results. CLINICAL DATA: Normocytic hypochromic anemia. Leukopenia.  EXAM: METASTATIC BONE SURVEY  COMPARISON: None.  FINDINGS: Multilevel degenerative disc disease is noted in the cervical and thoracic spine. Severe degenerative disc disease is noted in both knees. No areas of abnormal sclerosis or lytic destruction are seen in the visualized skeleton.  IMPRESSION: No lytic lesions are noted in the visualized skeleton.   Electronically Signed  By: Marijo Conception, M.D.  On: 08/07/2014 12:33  PATHOLOGY:  None   ASSESSMENT/PLAN:  Multiple Myeloma IgG kappa, STAGE II, serum albumin 3.5 g/dl IgG of 04/14/2007 MG/DL, total M spike of 2.7 g/DL Beta-2 microglobulin of 3.7 MG/L Severe Anemia BMBX with 18% monoclonal plasma cells, kappa light chain restsricted. Cytogenetics with 13q-, 17p- (high risk  disease) ESR 138 mm/hr Urine with kappa/lambda ratio 5.11, IgG monoclonal protein with kappa light chain specificity by immunofixation only Myeloma survey on 08/07/2014 with no lytic lesions noted PET/CT on 09/30/2014 with no abnormal hypermetabolism in the neck chest abdomen or pelvis RVD started on 10/05/2014   She has a formal diagnosis of multiple myeloma. Based upon staging, has stage II disease. Based upon cytogenetics has high risk disease.  He is currently on RVD. We wrote a dose reduced her Velcade because of lower extremity edema that was significant enough she went to the emergency department. She has trace edema in the legs today. She notes that it comes and goes. I advised her that Velcade and Revlimid can cause peripheral edema. We will continue to  observe this. On exam however was noted at the right lower extremity is larger than the left and given her current Revlimid use we will obtain an ultrasound.  She will return again in 1 week for ongoing observation. If she is doing well at that time we can begin to space her visits out. We will discuss a referral to [facility] for consideration of transplant in the near future  Orders Placed This Encounter  Procedures  . US Venous Img Lower Unilateral Right    Standing Status: Future     Number of Occurrences:      Standing Expiration Date: 10/20/2015    Order Specific Question:  Reason for Exam (SYMPTOM  OR DIAGNOSIS REQUIRED)    Answer:  RLE swelling on revlimid    Order Specific Question:  Preferred imaging location?    Answer:  [facility]    All questions were answered. The patient knows to call the clinic with any problems, questions or concerns. We can certainly see the patient much sooner if necessary.   This document serves as a record of services personally performed by Ancil Linsey, MD. It was created on her behalf by Janace Hoard, a trained medical scribe. The creation of this record is based on the scribe's personal observations and the provider's statements to them. This document has been checked and approved by the attending provider.  I have reviewed the above documentation for accuracy and completeness, and I agree with the above.  This note is electronically signed.  Kelby Fam. Whitney Muse, MD

## 2014-10-21 ENCOUNTER — Ambulatory Visit (HOSPITAL_COMMUNITY): Payer: 59 | Admitting: Oncology

## 2014-10-21 ENCOUNTER — Other Ambulatory Visit (HOSPITAL_COMMUNITY): Payer: Self-pay | Admitting: *Deleted

## 2014-10-21 DIAGNOSIS — C9000 Multiple myeloma not having achieved remission: Secondary | ICD-10-CM

## 2014-10-21 MED ORDER — POTASSIUM CHLORIDE ER 20 MEQ PO TBCR: 20.0000 meq | EXTENDED_RELEASE_TABLET | Freq: Two times a day (BID) | ORAL | Status: DC

## 2014-10-23 ENCOUNTER — Other Ambulatory Visit (HOSPITAL_COMMUNITY): Payer: Self-pay | Admitting: *Deleted

## 2014-10-23 DIAGNOSIS — C9000 Multiple myeloma not having achieved remission: Secondary | ICD-10-CM

## 2014-10-23 MED ORDER — REVLIMID 25 MG PO CAPS: 25.0000 mg | ORAL_CAPSULE | Freq: Every day | ORAL | Status: DC

## 2014-10-26 ENCOUNTER — Encounter (HOSPITAL_BASED_OUTPATIENT_CLINIC_OR_DEPARTMENT_OTHER): Payer: Medicare Other

## 2014-10-26 ENCOUNTER — Encounter (HOSPITAL_BASED_OUTPATIENT_CLINIC_OR_DEPARTMENT_OTHER): Payer: Medicare Other | Admitting: Oncology

## 2014-10-26 ENCOUNTER — Encounter (HOSPITAL_COMMUNITY): Payer: Self-pay | Admitting: Oncology

## 2014-10-26 VITALS — BP 141/78 | HR 92 | Temp 98.4°F | Resp 20 | Wt 340.0 lb

## 2014-10-26 DIAGNOSIS — C9000 Multiple myeloma not having achieved remission: Secondary | ICD-10-CM

## 2014-10-26 DIAGNOSIS — D72819 Decreased white blood cell count, unspecified: Secondary | ICD-10-CM | POA: Diagnosis not present

## 2014-10-26 DIAGNOSIS — Z5112 Encounter for antineoplastic immunotherapy: Secondary | ICD-10-CM

## 2014-10-26 DIAGNOSIS — D509 Iron deficiency anemia, unspecified: Secondary | ICD-10-CM | POA: Diagnosis not present

## 2014-10-26 LAB — CBC WITH DIFFERENTIAL/PLATELET
BASOS ABS: 0 10*3/uL (ref 0.0–0.1)
Basophils Relative: 0 %
EOS PCT: 0 %
Eosinophils Absolute: 0 10*3/uL (ref 0.0–0.7)
HEMATOCRIT: 29.7 % — AB (ref 36.0–46.0)
HEMOGLOBIN: 9.1 g/dL — AB (ref 12.0–15.0)
LYMPHS ABS: 0.7 10*3/uL (ref 0.7–4.0)
LYMPHS PCT: 21 %
MCH: 25 pg — AB (ref 26.0–34.0)
MCHC: 30.6 g/dL (ref 30.0–36.0)
MCV: 81.6 fL (ref 78.0–100.0)
MONOS PCT: 4 %
Monocytes Absolute: 0.1 10*3/uL (ref 0.1–1.0)
NEUTROS PCT: 75 %
Neutro Abs: 2.6 10*3/uL (ref 1.7–7.7)
Platelets: 203 10*3/uL (ref 150–400)
RBC: 3.64 MIL/uL — AB (ref 3.87–5.11)
RDW: 21.2 % — ABNORMAL HIGH (ref 11.5–15.5)
WBC: 3.4 10*3/uL — AB (ref 4.0–10.5)

## 2014-10-26 LAB — COMPREHENSIVE METABOLIC PANEL
ALK PHOS: 50 U/L (ref 38–126)
ALT: 15 U/L (ref 14–54)
AST: 18 U/L (ref 15–41)
Albumin: 3.4 g/dL — ABNORMAL LOW (ref 3.5–5.0)
Anion gap: 6 (ref 5–15)
BILIRUBIN TOTAL: 0.5 mg/dL (ref 0.3–1.2)
BUN: 20 mg/dL (ref 6–20)
CALCIUM: 8.7 mg/dL — AB (ref 8.9–10.3)
CHLORIDE: 106 mmol/L (ref 101–111)
CO2: 24 mmol/L (ref 22–32)
CREATININE: 1.01 mg/dL — AB (ref 0.44–1.00)
Glucose, Bld: 110 mg/dL — ABNORMAL HIGH (ref 65–99)
Potassium: 3.8 mmol/L (ref 3.5–5.1)
Sodium: 136 mmol/L (ref 135–145)
Total Protein: 8.5 g/dL — ABNORMAL HIGH (ref 6.5–8.1)

## 2014-10-26 LAB — SEDIMENTATION RATE: SED RATE: 117 mm/h — AB (ref 0–22)

## 2014-10-26 LAB — C-REACTIVE PROTEIN: CRP: 1.1 mg/dL — ABNORMAL HIGH (ref <1.0)

## 2014-10-26 LAB — LACTATE DEHYDROGENASE: LDH: 145 U/L (ref 98–192)

## 2014-10-26 MED ORDER — BORTEZOMIB CHEMO SQ INJECTION 3.5 MG (2.5MG/ML)
1.3000 mg/m2 | Freq: Once | INTRAMUSCULAR | Status: AC
  Administered 2014-10-26: 3.25 mg via SUBCUTANEOUS
  Filled 2014-10-26: qty 3.25

## 2014-10-26 MED ORDER — ONDANSETRON HCL 4 MG PO TABS
8.0000 mg | ORAL_TABLET | Freq: Once | ORAL | Status: AC
  Administered 2014-10-26: 8 mg via ORAL
  Filled 2014-10-26: qty 2

## 2014-10-26 NOTE — Patient Instructions (Signed)
[facility] Discharge Instructions for Patients Receiving Chemotherapy  Today you received the following chemotherapy agents velcade Follow up as scheduled Please call the clinic if you have any questions or concerns  To help prevent nausea and vomiting after your treatment, we encourage you to take your nausea medication    If you develop nausea and vomiting, or diarrhea that is not controlled by your medication, call the clinic.  The clinic phone number is [phone]. Office hours are Monday-Friday 8:30am-5:00pm.  BELOW ARE SYMPTOMS THAT SHOULD BE REPORTED IMMEDIATELY:  *FEVER GREATER THAN 101.0 F  *CHILLS WITH OR WITHOUT FEVER  NAUSEA AND VOMITING THAT IS NOT CONTROLLED WITH YOUR NAUSEA MEDICATION  *UNUSUAL SHORTNESS OF BREATH  *UNUSUAL BRUISING OR BLEEDING  TENDERNESS IN MOUTH AND THROAT WITH OR WITHOUT PRESENCE OF ULCERS  *URINARY PROBLEMS  *BOWEL PROBLEMS  UNUSUAL RASH Items with * indicate a potential emergency and should be followed up as soon as possible. If you have an emergency after office hours please contact your primary care physician or go to the nearest emergency department.  Please call the clinic during office hours if you have any questions or concerns.   You may also contact the Patient Navigator at [phone] should you have any questions or need assistance in obtaining follow up care. _____________________________________________________________________ Have you asked about our STAR program?    STAR stands for Survivorship Training and Rehabilitation, and this is a nationally recognized cancer care program that focuses on survivorship and rehabilitation.  Cancer and cancer treatments may cause problems, such as, pain, making you feel tired and keeping you from doing the things that you need or want to do. Cancer rehabilitation can help. Our goal is to reduce these troubling effects and help you have the best quality of life  possible.  You may receive a survey from a nurse that asks questions about your current state of health.  Based on the survey results, all eligible patients will be referred to the STAR program for an evaluation so we can better serve you! A frequently asked questions sheet is available upon request.            

## 2014-10-26 NOTE — Progress Notes (Signed)
Corrin Parker Tolerated chemotherapy injection well today Discharged ambulatory

## 2014-10-26 NOTE — Progress Notes (Signed)
Kelli Curb, PA-C [address] [place] [address]  Multiple myeloma  CURRENT THERAPY: Beginning RVD today, 10/05/2014.  INTERVAL HISTORY: Kelli Hurley 59 y.o. female returns for followup of IgG multiple myeloma, stage II with high risk disease based upon cytogenetics..    Multiple myeloma   08/07/2014 Imaging Bone Survey- No lytic lesions are noted in the visualized skeleton.   09/03/2014 Bone Marrow Transplant Bone Marrow, Aspirate,Biopsy, and Clot - NORMOCELLULAR BONE MARROW FOR AGE WITH PLASMA CELL NEOPLASM.  The plasma cell component is increased in the marrow representing an estimated 18% of all cells.   09/03/2014 Pathology Results Cytogenetics with 13q-, 17p- (high risk disease)   09/23/2014 Initial Diagnosis Multiple myeloma   09/30/2014 PET scan No abnormal hypermetabolism in the neck, chest, abdomen or pelvis.   10/05/2014 -  Chemotherapy RVD     I personally reviewed and went over laboratory results with the patient.  The results are noted within this dictation.  She reports that she is doing very well.  She denies any complaints today.  Her LE edema has resolved bilaterally.   I personally reviewed and went over radiographic studies with the patient.  The results are noted within this dictation.  [place] of leg was negative for DVT on her last encounter.    She notes that she cancelled her insurance sooner than she planned as she was getting Medicare/Medicaid.  As a result, she was unable to ascertain her Revlimid refill.  I have recruited the help of Kelli Hurley and she saw the patient today.  She was able to get her Revlimid refill rectified and she reports that she will get her Revlimid tomorrow.  In order to keep her on schedule, she will take 13 days of Revlimid (instead of 14) which will keep her on schedule.   Past Medical History  Diagnosis Date  . Hypertension   . Anemia   . Normocytic hypochromic anemia 08/03/2014  . Leukopenia  08/03/2014  . Claustrophobia 10/05/2014    has Primary osteoarthritis of both knees; Normocytic hypochromic anemia; Leukopenia; Multiple myeloma; and Claustrophobia on her problem list.     has No Known Allergies.  Current Outpatient Prescriptions on File Prior to Visit  Medication Sig Dispense Refill  . acyclovir (ZOVIRAX) 400 MG tablet Take 400 mg by mouth 2 (two) times daily.     Marland Kitchen ALPRAZolam (XANAX) 0.5 MG tablet Take 2 tablets 1 hour prior to PET scan. Then take 1 tab in waiting room if needed. 5 tablet 0  . aspirin 325 MG tablet Take 325 mg by mouth daily.    . Bortezomib (VELCADE IJ) Inject as directed. To begin on Days 1, 4, 8, & 11 every 21 days. To start approximately 10/02/14    . dexamethasone (DECADRON) 4 MG tablet Take 40 mg by mouth once a week.     . diazepam (VALIUM) 5 MG tablet Take 1 tab 1 hour prior to procedure. Then take 1-2 tablets when you arrive if needed. (Patient not taking: Reported on 10/05/2014) 5 tablet 0  . hydrochlorothiazide (HYDRODIURIL) 25 MG tablet Take 25 mg by mouth every morning.    . ondansetron (ZOFRAN) 8 MG tablet Take 8 mg by mouth every 8 (eight) hours as needed for nausea or vomiting.     . Potassium Chloride ER 20 MEQ TBCR Take 20 mEq by mouth 2 (two) times daily. 60 tablet 1  . prochlorperazine (COMPAZINE) 10 MG tablet  Take 10 mg by mouth every 6 (six) hours as needed for nausea or vomiting.     Marland Kitchen REVLIMID 25 MG capsule Take 1 capsule (25 mg total) by mouth daily. Take 14 days on and 7 days off 14 capsule 0  . vitamin C (ASCORBIC ACID) 500 MG tablet Take 500 mg by mouth daily.     Current Facility-Administered Medications on File Prior to Visit  Medication Dose Route Frequency Kelli Hurley Last Rate Last Dose  . heparin lock flush 100 unit/mL  500 Units Intravenous Once Kelli Ranks, MD      . methylPREDNISolone sodium succinate (SOLU-MEDROL) 125 mg/2 mL injection 60 mg  60 mg Intravenous Once Kelli Ranks, MD      . sodium chloride 0.9 %  injection 10 mL  10 mL Intravenous Once Kelli Ranks, MD        Past Surgical History  Procedure Laterality Date  . Abdominal hysterectomy    . 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   PHYSICAL EXAMINATION  ECOG PERFORMANCE STATUS: 1 - Symptomatic but completely ambulatory  Filed Vitals:   10/26/14 1018  BP: 141/78  Pulse: 92  Temp: 98.4 F (36.9 C)  Resp: 20    GENERAL:alert, no distress, well developed, comfortable, cooperative, obese and accompanied by daughter. SKIN: skin color, texture, turgor are normal, no rashes or significant lesions HEAD: Normocephalic, No masses, lesions, tenderness or abnormalities EYES: normal, PERRLA, EOMI, Conjunctiva are pink and non-injected EARS: External ears normal OROPHARYNX:lips, buccal mucosa, and tongue normal and mucous membranes are moist  NECK: supple, no adenopathy, thyroid normal size, non-tender, without nodularity, trachea midline LYMPH:  no palpable lymphadenopathy BREAST:not examined LUNGS: clear to auscultation  HEART: regular rate & rhythm, no murmurs and no gallops ABDOMEN:abdomen soft, non-tender, obese and normal bowel sounds BACK: Back symmetric, no curvature. EXTREMITIES:less then 2 second capillary refill, no joint deformities, effusion, or inflammation, no skin discoloration, no clubbing, no cyanosis  NEURO: alert & oriented x 3 with fluent speech, no focal motor/sensory deficits, gait normal    LABORATORY DATA: CBC    Component Value Date/Time   WBC 3.4* 10/26/2014 1013   RBC 3.64* 10/26/2014 1013   RBC 3.63* 08/03/2014 1546   HGB 9.1* 10/26/2014 1013   HCT 29.7* 10/26/2014 1013   PLT 203  10/26/2014 1013   MCV 81.6 10/26/2014 1013   MCH 25.0* 10/26/2014 1013   MCHC 30.6 10/26/2014 1013   RDW 21.2* 10/26/2014 1013   LYMPHSABS 0.7 10/26/2014 1013   MONOABS 0.1 10/26/2014 1013   EOSABS 0.0 10/26/2014 1013   BASOSABS 0.0 10/26/2014 1013      Chemistry      Component Value Date/Time   NA 136 10/26/2014 1013   K 3.8 10/26/2014 1013   CL 106 10/26/2014 1013   CO2 24 10/26/2014 1013   BUN 20 10/26/2014 1013   CREATININE 1.01* 10/26/2014 1013      Component Value Date/Time   CALCIUM 8.7* 10/26/2014 1013   ALKPHOS 50 10/26/2014 1013   AST 18 10/26/2014 1013   ALT 15 10/26/2014 1013   BILITOT 0.5 10/26/2014 1013        PENDING LABS:   RADIOGRAPHIC STUDIES:  Nm Pet  Image Initial (pi) Skull Base To Thigh  09/30/2014   CLINICAL DATA:  Initial treatment strategy for multiple myeloma.  EXAM: NUCLEAR MEDICINE PET SKULL BASE TO THIGH  TECHNIQUE: 17.0 mCi F-18 FDG was injected intravenously. Full-ring PET imaging was performed from the skull base to thigh after the radiotracer. CT data was obtained and used for attenuation correction and anatomic localization.  FASTING BLOOD GLUCOSE:  Value: 110 mg/dl  COMPARISON:  None.  FINDINGS: NECK  No hypermetabolic lymph nodes in the neck. CT images show no acute findings.  CHEST  No hypermetabolic mediastinal, hilar or axillary lymph nodes. No hypermetabolic pulmonary nodules. CT images show no pericardial or pleural effusion. Probable subpleural lymph node along the minor fissure. Image quality is markedly degraded by body habitus and respiratory motion.  ABDOMEN/PELVIS  No abnormal hypermetabolic activity in the liver, adrenal glands, spleen or pancreas. No hypermetabolic lymph nodes. CT images show the liver, gallbladder, adrenal gland, kidneys, spleen, pancreas, stomach and bowel to be grossly unremarkable. No free fluid. Small periumbilical hernia contains fat.  SKELETON  No abnormal osseous hypermetabolism.  IMPRESSION: No abnormal  hypermetabolism in the neck, chest, abdomen or pelvis.   Electronically Signed   By: Lorin Picket M.D.   On: 09/30/2014 15:17   US Venous Img Lower Unilateral Right  10/20/2014   CLINICAL DATA:  Left lower extremity edema for approximately 2 months  EXAM: LEFT LOWER EXTREMITY VENOUS DUPLEX ULTRASOUND  TECHNIQUE: Gray-scale sonography with graded compression, as well as color Doppler and duplex ultrasound were performed to evaluate the left lower extremity deep venous system from the level of the common femoral vein and including the common femoral, femoral, profunda femoral, popliteal and calf veins including the posterior tibial, peroneal and gastrocnemius veins when visible. The superficial great saphenous vein was also interrogated. Spectral Doppler was utilized to evaluate flow at rest and with distal augmentation maneuvers in the common femoral, femoral and popliteal veins.  COMPARISON:  None.  FINDINGS: Contralateral Common Femoral Vein: Respiratory phasicity is normal and symmetric with the symptomatic side. No evidence of thrombus. Normal compressibility.  Common Femoral Vein: No evidence of thrombus. Normal compressibility, respiratory phasicity and response to augmentation.  Saphenofemoral Junction: No evidence of thrombus. Normal compressibility and flow on color Doppler imaging.  Profunda Femoral Vein: No evidence of thrombus. Normal compressibility and flow on color Doppler imaging.  Femoral Vein: No evidence of thrombus. Normal compressibility, respiratory phasicity and response to augmentation. Note that the distal femoral vein is somewhat limited in visualization due to patient's large size.  Popliteal Vein: No evidence of thrombus. Normal compressibility, respiratory phasicity and response to augmentation.  Calf Veins: No evidence of thrombus. Normal compressibility and flow on color Doppler imaging.  Superficial Great Saphenous Vein: No evidence of thrombus. Normal compressibility and flow on  color Doppler imaging.  Venous Reflux:  None.  Other Findings:  None.  IMPRESSION: No evidence of left lower extremity deep venous thrombosis. A portion of the distal common femoral vein on the left is not well visualized due to the patient's large size. Right common femoral vein patent.   Electronically Signed   By: Lowella Grip III M.D.   On: 10/20/2014 15:55     PATHOLOGY:    ASSESSMENT AND PLAN:  Multiple myeloma IgG multiple myeloma with high risk cytogenetics, Stage II.  Began RVD therapy on 10/05/2014.    Staging completed.  Oncology history developed with cytogenetics.  She is having some insurance issues and therefore, Kelli Hurley  has met with the patient today.  Kelli Hurley does not have her Revlimid, which she was to start today.  It is planned that she will get Revlimid tomorrow, and therefore, to keep her on schedule, she will start tomorrow and will take for 13 days.  Labs today: CBC diff, CMET, LDH, ESR, CRP, MM panel B2M  Labs next week: CBC diff, CMET  Return for therapy as planned.  Return for follow-up as scheduled.   THERAPY PLAN:  Undergoing RVD therapy.  All questions were answered. The patient knows to call the clinic with any problems, questions or concerns. We can certainly see the patient much sooner if necessary.  Patient and plan discussed with Dr. Ancil Linsey and she is in agreement with the aforementioned.   This note is electronically signed by: Doy Mince 10/26/2014 12:59 PM

## 2014-10-26 NOTE — Progress Notes (Signed)
Labs drawn

## 2014-10-26 NOTE — Patient Instructions (Addendum)
[facility] Discharge Instructions for Patients Receiving Chemotherapy  Today you received the following chemotherapy agents:  Velcade Referral to Dr. Debbrah Alar at [facility] for Multiple myeloma and consideration of transplant  To help prevent nausea and vomiting after your treatment, we encourage you to take your nausea medication as prescribed.  If you develop nausea and vomiting, or diarrhea that is not controlled by your medication, call the clinic.  The clinic phone number is [phone]. Office hours are Monday-Friday 8:30am-5:00pm.  BELOW ARE SYMPTOMS THAT SHOULD BE REPORTED IMMEDIATELY:  *FEVER GREATER THAN 101.0 F  *CHILLS WITH OR WITHOUT FEVER  NAUSEA AND VOMITING THAT IS NOT CONTROLLED WITH YOUR NAUSEA MEDICATION  *UNUSUAL SHORTNESS OF BREATH  *UNUSUAL BRUISING OR BLEEDING  TENDERNESS IN MOUTH AND THROAT WITH OR WITHOUT PRESENCE OF ULCERS  *URINARY PROBLEMS  *BOWEL PROBLEMS  UNUSUAL RASH Items with * indicate a potential emergency and should be followed up as soon as possible. If you have an emergency after office hours please contact your primary care physician or go to the nearest emergency department.  Please call the clinic during office hours if you have any questions or concerns.   You may also contact the Patient Navigator at [phone] should you have any questions or need assistance in obtaining follow up care. _____________________________________________________________________ Have you asked about our STAR program?    STAR stands for Survivorship Training and Rehabilitation, and this is a nationally recognized cancer care program that focuses on survivorship and rehabilitation.  Cancer and cancer treatments may cause problems, such as, pain, making you feel tired and keeping you from doing the things that you need or want to do. Cancer rehabilitation can help. Our goal is to reduce these troubling effects and help you have the best  quality of life possible.  You may receive a survey from a nurse that asks questions about your current state of health.  Based on the survey results, all eligible patients will be referred to the [facility] program for an evaluation so we can better serve you! A frequently asked questions sheet is available upon request.

## 2014-10-26 NOTE — Progress Notes (Signed)
Okay to treat today per T. Sheldon Silvan, PA before [place] results complete.

## 2014-10-26 NOTE — Assessment & Plan Note (Addendum)
IgG multiple myeloma with high risk cytogenetics, Stage II.  Began RVD therapy on 10/05/2014.    Staging completed.  Oncology history developed with cytogenetics.  She is having some insurance issues and therefore, Kelli Hurley has met with the patient today.  Kelli Hurley does not have her Revlimid, which she was to start today.  It is planned that she will get Revlimid tomorrow, and therefore, to keep her on schedule, she will start tomorrow and will take for 13 days.  Labs today: CBC diff, CMET, LDH, ESR, CRP, MM panel B2M  Labs next week: CBC diff, CMET  Return for therapy as planned.  Return for follow-up as scheduled.

## 2014-10-27 ENCOUNTER — Encounter (HOSPITAL_COMMUNITY): Payer: Self-pay | Admitting: *Deleted

## 2014-10-27 LAB — MULTIPLE MYELOMA PANEL, SERUM
ALPHA2 GLOB SERPL ELPH-MCNC: 0.6 g/dL (ref 0.4–1.0)
Albumin SerPl Elph-Mcnc: 3.3 g/dL (ref 2.9–4.4)
Albumin/Glob SerPl: 0.8 (ref 0.7–1.7)
Alpha 1: 0.3 g/dL (ref 0.0–0.4)
B-Globulin SerPl Elph-Mcnc: 1 g/dL (ref 0.7–1.3)
Gamma Glob SerPl Elph-Mcnc: 2.5 g/dL — ABNORMAL HIGH (ref 0.4–1.8)
Globulin, Total: 4.4 g/dL — ABNORMAL HIGH (ref 2.2–3.9)
IGG (IMMUNOGLOBIN G), SERUM: 2866 mg/dL — AB (ref 700–1600)
IGM, SERUM: 45 mg/dL (ref 26–217)
IgA: 52 mg/dL — ABNORMAL LOW (ref 87–352)
M Protein SerPl Elph-Mcnc: 2 g/dL — ABNORMAL HIGH
TOTAL PROTEIN ELP: 7.7 g/dL (ref 6.0–8.5)

## 2014-10-27 LAB — KAPPA/LAMBDA LIGHT CHAINS
KAPPA, LAMDA LIGHT CHAIN RATIO: 4.61 — AB (ref 0.26–1.65)
Kappa free light chain: 58.01 mg/L — ABNORMAL HIGH (ref 3.30–19.40)
LAMDA FREE LIGHT CHAINS: 12.59 mg/L (ref 5.71–26.30)

## 2014-10-27 LAB — BETA 2 MICROGLOBULIN, SERUM: Beta-2 Microglobulin: 3.7 mg/L — ABNORMAL HIGH (ref 0.6–2.4)

## 2014-10-27 NOTE — Progress Notes (Signed)
She will receive her Revlimid tomorrow 9/21. Therefore, she will only have a 12 day cycle this time instead of a 14 day cycle.

## 2014-10-29 ENCOUNTER — Inpatient Hospital Stay (HOSPITAL_COMMUNITY): Payer: 59

## 2014-11-02 ENCOUNTER — Ambulatory Visit (HOSPITAL_COMMUNITY)
Admission: RE | Admit: 2014-11-02 | Discharge: 2014-11-02 | Disposition: A | Discharge status: Home / Self Care | Payer: Medicare Other | Source: Ambulatory Visit | Attending: Hematology & Oncology | Admitting: Hematology & Oncology

## 2014-11-02 ENCOUNTER — Encounter (HOSPITAL_COMMUNITY): Payer: Medicare Other | Admitting: Oncology

## 2014-11-02 ENCOUNTER — Encounter (HOSPITAL_BASED_OUTPATIENT_CLINIC_OR_DEPARTMENT_OTHER): Payer: Medicare Other

## 2014-11-02 ENCOUNTER — Encounter (HOSPITAL_COMMUNITY): Payer: Self-pay | Admitting: Lab

## 2014-11-02 VITALS — BP 154/86 | HR 92 | Temp 98.0°F | Resp 18

## 2014-11-02 DIAGNOSIS — R0689 Other abnormalities of breathing: Secondary | ICD-10-CM

## 2014-11-02 DIAGNOSIS — C9000 Multiple myeloma not having achieved remission: Secondary | ICD-10-CM

## 2014-11-02 DIAGNOSIS — R0600 Dyspnea, unspecified: Secondary | ICD-10-CM | POA: Diagnosis not present

## 2014-11-02 DIAGNOSIS — D509 Iron deficiency anemia, unspecified: Secondary | ICD-10-CM | POA: Diagnosis not present

## 2014-11-02 DIAGNOSIS — R0602 Shortness of breath: Secondary | ICD-10-CM | POA: Diagnosis not present

## 2014-11-02 DIAGNOSIS — Z5112 Encounter for antineoplastic immunotherapy: Secondary | ICD-10-CM

## 2014-11-02 DIAGNOSIS — D72819 Decreased white blood cell count, unspecified: Secondary | ICD-10-CM | POA: Diagnosis not present

## 2014-11-02 LAB — CBC WITH DIFFERENTIAL/PLATELET
BASOS ABS: 0 10*3/uL (ref 0.0–0.1)
BASOS PCT: 0 %
Eosinophils Absolute: 0.1 10*3/uL (ref 0.0–0.7)
Eosinophils Relative: 1 %
HEMATOCRIT: 30.3 % — AB (ref 36.0–46.0)
HEMOGLOBIN: 9.2 g/dL — AB (ref 12.0–15.0)
Lymphocytes Relative: 22 %
Lymphs Abs: 1.1 10*3/uL (ref 0.7–4.0)
MCH: 24.6 pg — ABNORMAL LOW (ref 26.0–34.0)
MCHC: 30.4 g/dL (ref 30.0–36.0)
MCV: 81 fL (ref 78.0–100.0)
Monocytes Absolute: 0.1 10*3/uL (ref 0.1–1.0)
Monocytes Relative: 2 %
NEUTROS ABS: 3.8 10*3/uL (ref 1.7–7.7)
NEUTROS PCT: 75 %
Platelets: 211 10*3/uL (ref 150–400)
RBC: 3.74 MIL/uL — ABNORMAL LOW (ref 3.87–5.11)
RDW: 21.7 % — ABNORMAL HIGH (ref 11.5–15.5)
WBC: 5 10*3/uL (ref 4.0–10.5)

## 2014-11-02 LAB — COMPREHENSIVE METABOLIC PANEL
ALBUMIN: 3.5 g/dL (ref 3.5–5.0)
ALK PHOS: 55 U/L (ref 38–126)
ALT: 21 U/L (ref 14–54)
AST: 25 U/L (ref 15–41)
Anion gap: 10 (ref 5–15)
BILIRUBIN TOTAL: 0.5 mg/dL (ref 0.3–1.2)
BUN: 13 mg/dL (ref 6–20)
CALCIUM: 9.1 mg/dL (ref 8.9–10.3)
CO2: 23 mmol/L (ref 22–32)
Chloride: 103 mmol/L (ref 101–111)
Creatinine, Ser: 1.37 mg/dL — ABNORMAL HIGH (ref 0.44–1.00)
GFR calc Af Amer: 49 mL/min — ABNORMAL LOW (ref >60)
GFR calc non Af Amer: 42 mL/min — ABNORMAL LOW (ref >60)
GLUCOSE: 112 mg/dL — AB (ref 65–99)
Potassium: 3.9 mmol/L (ref 3.5–5.1)
Sodium: 136 mmol/L (ref 135–145)
TOTAL PROTEIN: 8.2 g/dL — AB (ref 6.5–8.1)

## 2014-11-02 MED ORDER — ONDANSETRON HCL 4 MG PO TABS
8.0000 mg | ORAL_TABLET | Freq: Once | ORAL | Status: AC
  Administered 2014-11-02: 8 mg via ORAL
  Filled 2014-11-02: qty 2

## 2014-11-02 MED ORDER — IOHEXOL 350 MG/ML SOLN
80.0000 mL | Freq: Once | INTRAVENOUS | Status: AC | PRN
  Administered 2014-11-02: 80 mL via INTRAVENOUS

## 2014-11-02 MED ORDER — BORTEZOMIB CHEMO SQ INJECTION 3.5 MG (2.5MG/ML)
1.3000 mg/m2 | Freq: Once | INTRAMUSCULAR | Status: AC
  Administered 2014-11-02: 3.25 mg via SUBCUTANEOUS
  Filled 2014-11-02: qty 3.25

## 2014-11-02 NOTE — Patient Instructions (Signed)
[facility] Discharge Instructions for Patients Receiving Chemotherapy  Today you received the following chemotherapy agents Velcade injection as ordered.  To help prevent nausea and vomiting after your treatment, we encourage you to take your nausea medication as instructed. If you develop nausea and vomiting that is not controlled by your nausea medication, call the clinic. If it is after clinic hours your family physician or the after hours number for the clinic or go to the Emergency Department. BELOW ARE SYMPTOMS THAT SHOULD BE REPORTED IMMEDIATELY:  *FEVER GREATER THAN 101.0 F  *CHILLS WITH OR WITHOUT FEVER  NAUSEA AND VOMITING THAT IS NOT CONTROLLED WITH YOUR NAUSEA MEDICATION  *UNUSUAL SHORTNESS OF BREATH  *UNUSUAL BRUISING OR BLEEDING  TENDERNESS IN MOUTH AND THROAT WITH OR WITHOUT PRESENCE OF ULCERS  *URINARY PROBLEMS  *BOWEL PROBLEMS  UNUSUAL RASH Items with * indicate a potential emergency and should be followed up as soon as possible.  Return as scheduled.  I have been informed and understand all the instructions given to me. I know to contact the clinic, my physician, or go to the Emergency Department if any problems should occur. I do not have any questions at this time, but understand that I may call the clinic during office hours or the Patient Navigator at [phone] should I have any questions or need assistance in obtaining follow up care.    __________________________________________  _____________  __________ Signature of Patient or Authorized Representative            Date                   Time    __________________________________________ Nurse's Signature  

## 2014-11-02 NOTE — Progress Notes (Signed)
Kelli Hurley presents today for injection per MD orders. Velcade administered SQ in right Abdomen. Administration without incident. Patient tolerated well.  Patient noted to have a respiration rate of 32bpm upon entering the exam room after ambulating from the waiting room.  Oxygen saturation 82% that recovered to 100% with resp. rate of 18 after approximately 90 seconds at rest.  [name] made aware verbally and she ordered a CTA of the chest, for which the patient was rolled down to radiology in a wheelchair and given instruction to return after CT was completed.  She and her friend with her verbalized understanding.

## 2014-11-02 NOTE — Progress Notes (Signed)
Referral sent to Dr Luan Pulling.  Records sent on 9/26.  They will contact patient with appt.

## 2014-11-02 NOTE — Progress Notes (Signed)
Lab draw

## 2014-11-02 NOTE — Assessment & Plan Note (Deleted)
IgG multiple myeloma with high risk cytogenetics, Stage II.  Began RVD therapy on 10/05/2014.    Oncology history updated.  Labs today: CBC diff, CMET  She is starting to demonstrate a response to therapy with an improvement in anemia, decrease in M-spike, decrease in IgG, and decrease in free kappa light chains.  Labs in 2 weeks: CBC diff, CMET,  LDH, ESR, CRP, MM panel B2M  Return for therapy as planned.  Return for follow-up as scheduled.

## 2014-11-02 NOTE — Progress Notes (Signed)
No show

## 2014-11-05 ENCOUNTER — Inpatient Hospital Stay (HOSPITAL_COMMUNITY): Payer: 59

## 2014-11-05 ENCOUNTER — Other Ambulatory Visit (HOSPITAL_COMMUNITY): Payer: Self-pay | Admitting: Hematology & Oncology

## 2014-11-09 ENCOUNTER — Encounter (HOSPITAL_COMMUNITY): Payer: Self-pay

## 2014-11-09 ENCOUNTER — Encounter (HOSPITAL_COMMUNITY): Payer: Medicare Other | Attending: Oncology

## 2014-11-09 ENCOUNTER — Encounter (HOSPITAL_BASED_OUTPATIENT_CLINIC_OR_DEPARTMENT_OTHER): Payer: Medicare Other

## 2014-11-09 VITALS — BP 132/58 | HR 80 | Temp 97.8°F | Resp 18

## 2014-11-09 DIAGNOSIS — Z5112 Encounter for antineoplastic immunotherapy: Secondary | ICD-10-CM | POA: Diagnosis present

## 2014-11-09 DIAGNOSIS — D509 Iron deficiency anemia, unspecified: Secondary | ICD-10-CM | POA: Insufficient documentation

## 2014-11-09 DIAGNOSIS — C9000 Multiple myeloma not having achieved remission: Secondary | ICD-10-CM | POA: Diagnosis present

## 2014-11-09 DIAGNOSIS — Z23 Encounter for immunization: Secondary | ICD-10-CM

## 2014-11-09 DIAGNOSIS — D72819 Decreased white blood cell count, unspecified: Secondary | ICD-10-CM | POA: Diagnosis not present

## 2014-11-09 LAB — CBC WITH DIFFERENTIAL/PLATELET
Basophils Absolute: 0 10*3/uL (ref 0.0–0.1)
Basophils Relative: 0 %
Eosinophils Absolute: 0.1 10*3/uL (ref 0.0–0.7)
Eosinophils Relative: 1 %
HEMATOCRIT: 30.4 % — AB (ref 36.0–46.0)
Hemoglobin: 9.5 g/dL — ABNORMAL LOW (ref 12.0–15.0)
LYMPHS PCT: 23 %
Lymphs Abs: 1.5 10*3/uL (ref 0.7–4.0)
MCH: 25.1 pg — AB (ref 26.0–34.0)
MCHC: 31.3 g/dL (ref 30.0–36.0)
MCV: 80.4 fL (ref 78.0–100.0)
MONO ABS: 0.5 10*3/uL (ref 0.1–1.0)
MONOS PCT: 7 %
NEUTROS ABS: 4.5 10*3/uL (ref 1.7–7.7)
Neutrophils Relative %: 69 %
Platelets: 194 10*3/uL (ref 150–400)
RBC: 3.78 MIL/uL — ABNORMAL LOW (ref 3.87–5.11)
RDW: 21.4 % — AB (ref 11.5–15.5)
WBC: 6.6 10*3/uL (ref 4.0–10.5)

## 2014-11-09 LAB — COMPREHENSIVE METABOLIC PANEL
ALT: 22 U/L (ref 14–54)
ANION GAP: 7 (ref 5–15)
AST: 23 U/L (ref 15–41)
Albumin: 3.5 g/dL (ref 3.5–5.0)
Alkaline Phosphatase: 57 U/L (ref 38–126)
BILIRUBIN TOTAL: 0.4 mg/dL (ref 0.3–1.2)
BUN: 15 mg/dL (ref 6–20)
CO2: 25 mmol/L (ref 22–32)
Calcium: 8.5 mg/dL — ABNORMAL LOW (ref 8.9–10.3)
Chloride: 101 mmol/L (ref 101–111)
Creatinine, Ser: 1.03 mg/dL — ABNORMAL HIGH (ref 0.44–1.00)
GFR calc Af Amer: >60 mL/min (ref >60)
GFR, EST NON AFRICAN AMERICAN: 59 mL/min — AB (ref >60)
Glucose, Bld: 105 mg/dL — ABNORMAL HIGH (ref 65–99)
POTASSIUM: 4.1 mmol/L (ref 3.5–5.1)
Sodium: 133 mmol/L — ABNORMAL LOW (ref 135–145)
TOTAL PROTEIN: 7.9 g/dL (ref 6.5–8.1)

## 2014-11-09 MED ORDER — INFLUENZA VAC SPLIT QUAD 0.5 ML IM SUSY
PREFILLED_SYRINGE | INTRAMUSCULAR | Status: AC
  Filled 2014-11-09: qty 0.5

## 2014-11-09 MED ORDER — ONDANSETRON HCL 4 MG PO TABS
8.0000 mg | ORAL_TABLET | Freq: Once | ORAL | Status: AC
  Administered 2014-11-09: 8 mg via ORAL

## 2014-11-09 MED ORDER — BORTEZOMIB CHEMO SQ INJECTION 3.5 MG (2.5MG/ML)
1.3000 mg/m2 | Freq: Once | INTRAMUSCULAR | Status: AC
  Administered 2014-11-09: 3.25 mg via SUBCUTANEOUS
  Filled 2014-11-09: qty 3.25

## 2014-11-09 MED ORDER — INFLUENZA VAC SPLIT QUAD 0.5 ML IM SUSY
0.5000 mL | PREFILLED_SYRINGE | Freq: Once | INTRAMUSCULAR | Status: AC
  Administered 2014-11-09: 0.5 mL via INTRAMUSCULAR

## 2014-11-09 MED ORDER — ONDANSETRON HCL 4 MG PO TABS
ORAL_TABLET | ORAL | Status: AC
  Filled 2014-11-09: qty 2

## 2014-11-09 NOTE — Patient Instructions (Signed)
[facility] Discharge Instructions for Patients Receiving Chemotherapy  Today you received the following chemotherapy agents velcade Flu shot today Return as scheduled Please call the clinic if you have any questions or concerns  To help prevent nausea and vomiting after your treatment, we encourage you to take your nausea medication    If you develop nausea and vomiting, or diarrhea that is not controlled by your medication, call the clinic.  The clinic phone number is [phone]. Office hours are Monday-Friday 8:30am-5:00pm.  BELOW ARE SYMPTOMS THAT SHOULD BE REPORTED IMMEDIATELY:  *FEVER GREATER THAN 101.0 F  *CHILLS WITH OR WITHOUT FEVER  NAUSEA AND VOMITING THAT IS NOT CONTROLLED WITH YOUR NAUSEA MEDICATION  *UNUSUAL SHORTNESS OF BREATH  *UNUSUAL BRUISING OR BLEEDING  TENDERNESS IN MOUTH AND THROAT WITH OR WITHOUT PRESENCE OF ULCERS  *URINARY PROBLEMS  *BOWEL PROBLEMS  UNUSUAL RASH Items with * indicate a potential emergency and should be followed up as soon as possible. If you have an emergency after office hours please contact your primary care physician or go to the nearest emergency department.  Please call the clinic during office hours if you have any questions or concerns.   You may also contact the Patient Navigator at [phone] should you have any questions or need assistance in obtaining follow up care. _____________________________________________________________________ Have you asked about our STAR program?    STAR stands for Survivorship Training and Rehabilitation, and this is a nationally recognized cancer care program that focuses on survivorship and rehabilitation.  Cancer and cancer treatments may cause problems, such as, pain, making you feel tired and keeping you from doing the things that you need or want to do. Cancer rehabilitation can help. Our goal is to reduce these troubling effects and help you have the best quality of  life possible.  You may receive a survey from a nurse that asks questions about your current state of health.  Based on the survey results, all eligible patients will be referred to the [facility] program for an evaluation so we can better serve you! A frequently asked questions sheet is available upon request.

## 2014-11-09 NOTE — Progress Notes (Signed)
Lab draw

## 2014-11-09 NOTE — Progress Notes (Signed)
Kelli Hurley Tolerated chemo injection well today along with flu vaccine

## 2014-11-10 ENCOUNTER — Other Ambulatory Visit (HOSPITAL_COMMUNITY): Payer: Self-pay | Admitting: Oncology

## 2014-11-10 DIAGNOSIS — C9001 Multiple myeloma in remission: Secondary | ICD-10-CM

## 2014-11-10 MED ORDER — REVLIMID 25 MG PO CAPS: 25.0000 mg | ORAL_CAPSULE | Freq: Every day | ORAL | Status: DC

## 2014-11-12 ENCOUNTER — Other Ambulatory Visit (HOSPITAL_COMMUNITY): Payer: Self-pay | Admitting: Respiratory Therapy

## 2014-11-12 DIAGNOSIS — R0602 Shortness of breath: Secondary | ICD-10-CM | POA: Diagnosis not present

## 2014-11-12 DIAGNOSIS — I10 Essential (primary) hypertension: Secondary | ICD-10-CM | POA: Diagnosis not present

## 2014-11-12 DIAGNOSIS — C9000 Multiple myeloma not having achieved remission: Secondary | ICD-10-CM | POA: Diagnosis not present

## 2014-11-13 ENCOUNTER — Other Ambulatory Visit (HOSPITAL_COMMUNITY): Payer: Self-pay | Admitting: Respiratory Therapy

## 2014-11-16 ENCOUNTER — Encounter (HOSPITAL_BASED_OUTPATIENT_CLINIC_OR_DEPARTMENT_OTHER): Payer: Medicare Other

## 2014-11-16 ENCOUNTER — Encounter (HOSPITAL_COMMUNITY): Payer: Self-pay | Admitting: Hematology & Oncology

## 2014-11-16 ENCOUNTER — Encounter (HOSPITAL_BASED_OUTPATIENT_CLINIC_OR_DEPARTMENT_OTHER): Payer: Medicare Other | Admitting: Hematology & Oncology

## 2014-11-16 ENCOUNTER — Encounter (HOSPITAL_COMMUNITY): Payer: Medicare Other

## 2014-11-16 VITALS — BP 139/80 | HR 80 | Temp 98.6°F | Resp 22 | Wt 342.8 lb

## 2014-11-16 DIAGNOSIS — D509 Iron deficiency anemia, unspecified: Secondary | ICD-10-CM | POA: Diagnosis not present

## 2014-11-16 DIAGNOSIS — I10 Essential (primary) hypertension: Secondary | ICD-10-CM

## 2014-11-16 DIAGNOSIS — Z5112 Encounter for antineoplastic immunotherapy: Secondary | ICD-10-CM | POA: Diagnosis present

## 2014-11-16 DIAGNOSIS — D72819 Decreased white blood cell count, unspecified: Secondary | ICD-10-CM | POA: Diagnosis not present

## 2014-11-16 DIAGNOSIS — C9000 Multiple myeloma not having achieved remission: Secondary | ICD-10-CM

## 2014-11-16 DIAGNOSIS — R0902 Hypoxemia: Secondary | ICD-10-CM

## 2014-11-16 DIAGNOSIS — D649 Anemia, unspecified: Secondary | ICD-10-CM | POA: Diagnosis not present

## 2014-11-16 LAB — COMPREHENSIVE METABOLIC PANEL
ALBUMIN: 3.5 g/dL (ref 3.5–5.0)
ALT: 16 U/L (ref 14–54)
ANION GAP: 6 (ref 5–15)
AST: 19 U/L (ref 15–41)
Alkaline Phosphatase: 50 U/L (ref 38–126)
BUN: 16 mg/dL (ref 6–20)
CO2: 26 mmol/L (ref 22–32)
Calcium: 8.5 mg/dL — ABNORMAL LOW (ref 8.9–10.3)
Chloride: 103 mmol/L (ref 101–111)
Creatinine, Ser: 0.92 mg/dL (ref 0.44–1.00)
GFR calc non Af Amer: >60 mL/min (ref >60)
GLUCOSE: 108 mg/dL — AB (ref 65–99)
POTASSIUM: 4 mmol/L (ref 3.5–5.1)
SODIUM: 135 mmol/L (ref 135–145)
TOTAL PROTEIN: 7.7 g/dL (ref 6.5–8.1)
Total Bilirubin: 0.5 mg/dL (ref 0.3–1.2)

## 2014-11-16 LAB — CBC WITH DIFFERENTIAL/PLATELET
BASOS PCT: 0 %
Basophils Absolute: 0 10*3/uL (ref 0.0–0.1)
EOS ABS: 0 10*3/uL (ref 0.0–0.7)
EOS PCT: 1 %
HCT: 31.5 % — ABNORMAL LOW (ref 36.0–46.0)
Hemoglobin: 9.7 g/dL — ABNORMAL LOW (ref 12.0–15.0)
Lymphocytes Relative: 22 %
Lymphs Abs: 0.9 10*3/uL (ref 0.7–4.0)
MCH: 25.1 pg — AB (ref 26.0–34.0)
MCHC: 30.8 g/dL (ref 30.0–36.0)
MCV: 81.6 fL (ref 78.0–100.0)
MONO ABS: 0.2 10*3/uL (ref 0.1–1.0)
MONOS PCT: 6 %
NEUTROS PCT: 71 %
Neutro Abs: 2.8 10*3/uL (ref 1.7–7.7)
PLATELETS: 201 10*3/uL (ref 150–400)
RBC: 3.86 MIL/uL — ABNORMAL LOW (ref 3.87–5.11)
RDW: 21.3 % — AB (ref 11.5–15.5)
WBC: 3.9 10*3/uL — ABNORMAL LOW (ref 4.0–10.5)

## 2014-11-16 LAB — C-REACTIVE PROTEIN: CRP: 0.8 mg/dL (ref <1.0)

## 2014-11-16 LAB — SEDIMENTATION RATE: SED RATE: 50 mm/h — AB (ref 0–22)

## 2014-11-16 LAB — LACTATE DEHYDROGENASE: LDH: 174 U/L (ref 98–192)

## 2014-11-16 MED ORDER — ONDANSETRON HCL 4 MG PO TABS
ORAL_TABLET | ORAL | Status: AC
  Filled 2014-11-16: qty 2

## 2014-11-16 MED ORDER — BORTEZOMIB CHEMO SQ INJECTION 3.5 MG (2.5MG/ML)
1.3000 mg/m2 | Freq: Once | INTRAMUSCULAR | Status: AC
  Administered 2014-11-16: 3.25 mg via SUBCUTANEOUS
  Filled 2014-11-16: qty 3.25

## 2014-11-16 MED ORDER — ONDANSETRON HCL 4 MG PO TABS
8.0000 mg | ORAL_TABLET | Freq: Once | ORAL | Status: AC
  Administered 2014-11-16: 8 mg via ORAL

## 2014-11-16 NOTE — Progress Notes (Signed)
Please see doctors encounter for more information 

## 2014-11-16 NOTE — Progress Notes (Signed)
[facility] Hematology/Oncology Progress Note   Name: Kelli Hurley      MRN: [id number]    Date: 11/16/2014 Time:11:10 AM   REFERRING PHYSICIAN:  Dr. Charlotte Sanes  REASON FOR CONSULT:  Anemia, leukopenia, and cryglobulinemia   DIAGNOSIS:  Multiple Myeloma IgG kappa, STAGE II, serum albumin 3.5 g/dl  HISTORY OF PRESENT ILLNESS:   Kelli Hurley is a 59 year old black American woman with a past medical history significant for HTN and obesity who was referred to [facility] for anemia, leukopenia, and cryglobulinemia. She has been diagnosed with multiple myeloma. She is currently on RVD therapy. She was noted to have hypoxemia on exertion and CTA of the chest was done on 11/02/2014. No evidence PE.   She recently saw Dr. Chrissie Noa who plans to perform PFT's. She has never had a sleep study performed. Her trouble breathing is not noticeable while at rest, only presently while moving.   Her appetite is good. She denies fever, bowel issues, or any major dental issues. Her last dentist appointment was about one year ago. She has no complaints at this time. She is taking aspirin daily.    PAST MEDICAL HISTORY:   Past Medical History  Diagnosis Date  . Hypertension   . Anemia   . Normocytic hypochromic anemia 08/03/2014  . Leukopenia 08/03/2014  . Claustrophobia 10/05/2014    ALLERGIES: No Known Allergies    MEDICATIONS: I have reviewed the patient's current medications.    Current Outpatient Prescriptions on File Prior to Visit  Medication Sig Dispense Refill  . aspirin 325 MG tablet Take 325 mg by mouth daily.    . Bortezomib (VELCADE IJ) Inject as directed. To begin on Days 1, 4, 8, & 11 every 21 days. To start approximately 10/02/14    . dexamethasone (DECADRON) 4 MG tablet Take 40 mg by mouth once a week.     . hydrochlorothiazide (HYDRODIURIL) 25 MG tablet Take 25 mg by mouth every morning.    . ondansetron (ZOFRAN) 8 MG tablet Take 8 mg by mouth every 8 (eight)  hours as needed for nausea or vomiting.     . potassium chloride SA (K-DUR,KLOR-CON) 20 MEQ tablet TAKE 1 TABLET BY MOUTH TWICE DAILY 30 tablet 0  . REVLIMID 25 MG capsule Take 1 capsule (25 mg total) by mouth daily. Take 14 days on and 7 days off 14 capsule 0  . vitamin C (ASCORBIC ACID) 500 MG tablet Take 500 mg by mouth daily.    Marland Kitchen acyclovir (ZOVIRAX) 400 MG tablet Take 400 mg by mouth 2 (two) times daily.     Marland Kitchen ALPRAZolam (XANAX) 0.5 MG tablet Take 2 tablets 1 hour prior to PET scan. Then take 1 tab in waiting room if needed. (Patient not taking: Reported on 11/16/2014) 5 tablet 0  . diazepam (VALIUM) 5 MG tablet Take 1 tab 1 hour prior to procedure. Then take 1-2 tablets when you arrive if needed. (Patient not taking: Reported on 10/05/2014) 5 tablet 0  . prochlorperazine (COMPAZINE) 10 MG tablet Take 10 mg by mouth every 6 (six) hours as needed for nausea or vomiting.      Current Facility-Administered Medications on File Prior to Visit  Medication Dose Route Frequency Provider Last Rate Last Dose  . heparin lock flush 100 unit/mL  500 Units Intravenous Once Patrici Ranks, MD      . methylPREDNISolone sodium succinate (SOLU-MEDROL) 125 mg/2 mL injection 60 mg  60  mg Intravenous Once Patrici Ranks, MD      . sodium chloride 0.9 % injection 10 mL  10 mL Intravenous Once Patrici Ranks, MD         PAST SURGICAL HISTORY Past Surgical History  Procedure Laterality Date  . Abdominal hysterectomy    . Other surgical history      heart surgery as infant to "repair hole in heart"    FAMILY HISTORY: Family History  Problem Relation Age of Onset  . Cancer Mother   . Hypertension Mother   . Cancer Father   . Hypertension Father   . Cancer Maternal Grandmother     SOCIAL HISTORY: She reports that she used to smoke as a teenager, but quit > 40 years ago.  She denies EtOH abuse.  She denies illicit drug abuse.  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  She live byherself.  She is divorced with two daughters ages 84 and 17 who are healthy.  Her grandbaby is healthy too.  She is Psychologist, forensic.  PERFORMANCE STATUS: The patient's performance status is 1 - Symptomatic but completely ambulatory  ROS: Denies fever, appetite loss.  PHYSICAL EXAM: Most Recent Vital Signs: Blood pressure 139/80, pulse 80, temperature 98.6 F (37 C), temperature source Oral, resp. rate 22, weight 342 lb 12.8 oz (155.493 kg), SpO2 96 %. General appearance: alert, cooperative, appears stated age, no distress and morbidly obese. Assisted onto exam table. Head: Normocephalic, without obvious abnormality, atraumatic Eyes: conjunctivae/corneas clear. PERRL, EOM's intact. Fundi benign. Throat: lips, mucosa, and tongue normal; teeth and gums normal Neck: no adenopathy, supple, symmetrical, trachea midline and thyroid not enlarged, symmetric, no tenderness/mass/nodules Back: symmetric, no curvature. ROM normal. No CVA tenderness. Lungs: clear to auscultation bilaterally and normal percussion bilaterally Heart: regular rate and rhythm, S1, S2 normal, no murmur, click, rub or gallop Abdomen: soft, non-tender; bowel sounds normal; no masses,  no organomegaly Extremities: extremities normal, atraumatic, no cyanosis edema at the ankles bilaterally. Right lower extremity greater than left lower extremity Skin: Skin color, texture, turgor normal. No rashes or lesions Lymph nodes: Cervical, supraclavicular, and axillary nodes normal. Neurologic: Alert and oriented X 3, normal strength and tone. Normal symmetric reflexes. Normal coordination and gait  LABORATORY DATA:  I have reviewed the data as listed.  Bone Marrow BiopsyS Diagnosis Bone Marrow, Aspirate,Biopsy, and Clot - NORMOCELLULAR BONE MARROW FOR AGE WITH PLASMA CELL NEOPLASM. - TRILINEAGE HEMATOPOIESIS. - SEE COMMENT. PERIPHERAL BLOOD: - NORMOCYTIC-NORMOCHROMIC ANEMIA -  LEUKOPENIA Diagnosis Note Despite limited aspirate material, the plasma cell component is increased in the marrow representing an estimated 18% of all cells with associated atypical cytomorphologic features. Immunohistochemical stains show the the plasma cells are kappa light chain-restricted consistent with plasma cell neoplasm. The background shows trilineage hematopoiesis with non specific changes. Correlation with cytogenetic and FISH studies is recommended. (BNS:ds/gt, 09/04/14) Susanne Greenhouse MD Pathologist, Electronic Signature (Case signed 09/07/2014)   RADIOGRAPHY: I have reviewed the images below and agree with the results. CLINICAL DATA: Normocytic hypochromic anemia. Leukopenia.  EXAM: METASTATIC BONE SURVEY  COMPARISON: None.  FINDINGS: Multilevel degenerative disc disease is noted in the cervical and thoracic spine. Severe degenerative disc disease is noted in both knees. No areas of abnormal sclerosis or lytic destruction are seen in the visualized skeleton.  IMPRESSION: No lytic lesions are noted in the visualized skeleton.   Electronically Signed  By: Marijo Conception, M.D.  On: 08/07/2014 12:33  PATHOLOGY:  None   ASSESSMENT/PLAN:  Multiple Myeloma IgG kappa, STAGE II, serum albumin 3.5 g/dl IgG of 04/14/2007 MG/DL, total M spike of 2.7 g/DL Beta-2 microglobulin of 3.7 MG/L Severe Anemia BMBX with 18% monoclonal plasma cells, kappa light chain restsricted. Cytogenetics with 13q-, 17p- (high risk disease) ESR 138 mm/hr Urine with kappa/lambda ratio 5.11, IgG monoclonal protein with kappa light chain specificity by immunofixation only Myeloma survey on 08/07/2014 with no lytic lesions noted PET/CT on 09/30/2014 with no abnormal hypermetabolism in the neck chest abdomen or pelvis RVD started on 10/05/2014   She has a formal diagnosis of multiple myeloma. Based upon staging, has stage II disease. Based upon cytogenetics has high risk  disease.  She is currently on RVD. We dose reduced her Velcade because of lower extremity edema that was significant enough she went to the emergency department.   She is doing well. She is working with Dr. Luan Pulling in regards to her exertional hypoxemia. I advised her that this will have to be resolved prior to referral to [facility].   I have discussed with the patient that while on zometa she cannot have any major dental work done. We discussed the risks and benefits of Zometa therapy, I explained to the patient that even though we do not have evidence of lytic disease that Zometa is indicated in all patients with multiple myeloma. She does receive dental care. We discussed the risk of osteonecrosis of the jaw with certain dental procedures.  She will return prior to her next cycle of therapy.   All questions were answered. The patient knows to call the clinic with any problems, questions or concerns. We can certainly see the patient much sooner if necessary.   This document serves as a record of services personally performed by Ancil Linsey, MD. It was created on her behalf by Arlyce Harman, a trained medical scribe. The creation of this record is based on the scribe's personal observations and the provider's statements to them. This document has been checked and approved by the attending provider.  I have reviewed the above documentation for accuracy and completeness, and I agree with the above.  This note is electronically signed.  Kelby Fam. Whitney Muse, MD

## 2014-11-16 NOTE — Patient Instructions (Signed)
[place] at [facility] Discharge Instructions  RECOMMENDATIONS MADE BY THE CONSULTANT AND ANY TEST RESULTS WILL BE SENT TO YOUR REFERRING PHYSICIAN.  Continue with plans for Velcade injections. Continue with Revlimid. We will get you a Azzan Butler prescription/refill. You will start Zometa infusion monthly. (We will give infusion at your next Velcade injection appointment) MD appointment as scheduled.  Thank you for choosing [place] at [facility] to provide your oncology and hematology care.  To afford each patient quality time with our provider, please arrive at least 15 minutes before your scheduled appointment time.    You need to re-schedule your appointment should you arrive 10 or more minutes late.  We strive to give you quality time with our providers, and arriving late affects you and other patients whose appointments are after yours.  Also, if you no show three or more times for appointments you may be dismissed from the clinic at the providers discretion.     Again, thank you for choosing [facility]  Our hope is that these requests will decrease the amount of time that you wait before being seen by our physicians.       _____________________________________________________________  Should you have questions after your visit to [facility], please contact our office at [phone] between the hours of 8:30 a.m. and 4:30 p.m.  Voicemails left after 4:30 p.m. will not be returned until the following business day.  For prescription refill requests, have your pharmacy contact our office.

## 2014-11-17 LAB — KAPPA/LAMBDA LIGHT CHAINS
Kappa free light chain: 45.16 mg/L — ABNORMAL HIGH (ref 3.30–19.40)
Kappa, lambda light chain ratio: 2.44 — ABNORMAL HIGH (ref 0.26–1.65)
LAMDA FREE LIGHT CHAINS: 18.53 mg/L (ref 5.71–26.30)

## 2014-11-17 LAB — BETA 2 MICROGLOBULIN, SERUM: Beta-2 Microglobulin: 3.6 mg/L — ABNORMAL HIGH (ref 0.6–2.4)

## 2014-11-17 NOTE — Progress Notes (Signed)
Lab draw

## 2014-11-18 LAB — MULTIPLE MYELOMA PANEL, SERUM
ALBUMIN/GLOB SERPL: 1.1 (ref 0.7–1.7)
ALPHA 1: 0.2 g/dL (ref 0.0–0.4)
ALPHA2 GLOB SERPL ELPH-MCNC: 0.5 g/dL (ref 0.4–1.0)
Albumin SerPl Elph-Mcnc: 3.6 g/dL (ref 2.9–4.4)
B-Globulin SerPl Elph-Mcnc: 1 g/dL (ref 0.7–1.3)
Gamma Glob SerPl Elph-Mcnc: 1.7 g/dL (ref 0.4–1.8)
Globulin, Total: 3.5 g/dL (ref 2.2–3.9)
IGA: 61 mg/dL — AB (ref 87–352)
IGG (IMMUNOGLOBIN G), SERUM: 2010 mg/dL — AB (ref 700–1600)
IGM, SERUM: 124 mg/dL (ref 26–217)
M Protein SerPl Elph-Mcnc: 1.3 g/dL — ABNORMAL HIGH
TOTAL PROTEIN ELP: 7.1 g/dL (ref 6.0–8.5)

## 2014-11-19 ENCOUNTER — Inpatient Hospital Stay (HOSPITAL_COMMUNITY): Payer: 59

## 2014-11-20 ENCOUNTER — Ambulatory Visit (HOSPITAL_COMMUNITY)
Admission: RE | Admit: 2014-11-20 | Discharge: 2014-11-20 | Disposition: A | Discharge status: Home / Self Care | Payer: Medicare Other | Source: Ambulatory Visit | Attending: Pulmonary Disease | Admitting: Pulmonary Disease

## 2014-11-20 DIAGNOSIS — R0602 Shortness of breath: Secondary | ICD-10-CM | POA: Insufficient documentation

## 2014-11-20 LAB — PULMONARY FUNCTION TEST
DL/VA % PRED: 94 %
DL/VA: 4.31 ml/min/mmHg/L
DLCO UNC % PRED: 73 %
DLCO unc: 15.87 ml/min/mmHg
FEF 25-75 PRE: 2.81 L/s
FEF 25-75 Post: 2.23 L/sec
FEF2575-%CHANGE-POST: -20 %
FEF2575-%PRED-PRE: 139 %
FEF2575-%Pred-Post: 110 %
FEV1-%Change-Post: -2 %
FEV1-%PRED-POST: 100 %
FEV1-%Pred-Pre: 103 %
FEV1-PRE: 2.02 L
FEV1-Post: 1.97 L
FEV1FVC-%Change-Post: 1 %
FEV1FVC-%Pred-Pre: 106 %
FEV6-%CHANGE-POST: -4 %
FEV6-%PRED-POST: 94 %
FEV6-%Pred-Pre: 98 %
FEV6-Post: 2.26 L
FEV6-Pre: 2.37 L
FEV6FVC-%PRED-POST: 104 %
FEV6FVC-%PRED-PRE: 104 %
FVC-%CHANGE-POST: -4 %
FVC-%Pred-Post: 90 %
FVC-%Pred-Pre: 95 %
FVC-Post: 2.26 L
FVC-Pre: 2.37 L
POST FEV1/FVC RATIO: 87 %
POST FEV6/FVC RATIO: 100 %
PRE FEV6/FVC RATIO: 100 %
Pre FEV1/FVC ratio: 85 %

## 2014-11-20 MED ORDER — ALBUTEROL SULFATE (2.5 MG/3ML) 0.083% IN NEBU
2.5000 mg | INHALATION_SOLUTION | Freq: Once | RESPIRATORY_TRACT | Status: AC
  Administered 2014-11-20: 2.5 mg via RESPIRATORY_TRACT

## 2014-11-22 NOTE — Assessment & Plan Note (Addendum)
Stage II, multiple myeloma, IgG kappa, serum albumin 3.5 g/dL with high risk cytogenetics.  Oncology history is updated.  Labs today: CBC diff, CMET  Zometa IV today.  Zometa therapy plan built and reviewed.  Patient education provided regarding risks, benefits, alternatives, and side effects of this therapy including, but not limited to, hypocalcemia, ONJ, anaphylaxis, infusion site reaction, and death.  I will give Rx for Oscal + D 2 tabs daily.  She is educated to let her dentist know that she is on bisphosphonate therapy and we are to be informed of any elective dental procedure.  Continue follow-up with Dr. Luan Pulling for exertional hypoxemia.  This will need to be sorted out prior to referral to [facility] for consideration of transplant.  Return in 4 weeks for follow-up.

## 2014-11-22 NOTE — Progress Notes (Signed)
[name], PA-C [address] [place] [address]  Multiple myeloma not having achieved remission ([place]) - Plan: calcium-vitamin D (OSCAL WITH D) 500-200 MG-UNIT tablet  Diuretic-induced hypokalemia - Plan: potassium chloride SA (K-DUR,KLOR-CON) 20 MEQ tablet  CURRENT THERAPY: RVD + Zometa  INTERVAL HISTORY: Kelli Hurley 59 y.o. female returns for followup of Stage II, multiple myeloma, IgG kappa, serum albumin 3.5 g/dL with high risk cytogenetics.    Multiple myeloma ([place])   08/07/2014 Imaging Bone Survey- No lytic lesions are noted in the visualized skeleton.   09/03/2014 Bone Marrow Transplant Bone Marrow, Aspirate,Biopsy, and Clot - NORMOCELLULAR BONE MARROW FOR AGE WITH PLASMA CELL NEOPLASM.  The plasma cell component is increased in the marrow representing an estimated 18% of all cells.   09/03/2014 Pathology Results Cytogenetics with 13q-, 17p- (high risk disease)   09/23/2014 Initial Diagnosis Multiple myeloma   09/30/2014 PET scan No abnormal hypermetabolism in the neck, chest, abdomen or pelvis.   10/05/2014 -  Chemotherapy RVD   10/28/2014 Treatment Plan Change Issues related to getting Revlimid in a timely fashion, therefore, she received her Revlimid on 9/21 resulting in a 12 day cycle this time instead of a 14 day cycle   11/02/2014 Imaging CTA chest- No evidence for a large or central pulmonary embolism as described.  8 mm density along the right minor fissure could represent focal pleural thickening but indeterminate. If the patient is at high risk for bronchogenic carcinoma, follow-up c   11/23/2014 Miscellaneous Zometa 4 mg IV monthly    I personally reviewed and went over laboratory results with the patient.  The results are noted within this dictation.  Her labs continue to demonstrate a response to therapy.  We will update some labs today.  She denies any jaw or tooth issues presently.  She denies any PN.    She will start Zometa today.   Supportive therapy plan is built.  I have escribed Oscal + D BID to prevent hypocalcemia.  Additionally, I have educated the patient on Zometa-induced hypocalcemia.  She is also taught about ONJ.  She is to inform her dentist regarding the fact that she is on bisphosphonate therapy.  She is educated that we are to know about any elective tooth or jaw procedure.  She denies any issues today.  She continues to follow with Dr. Luan Pulling.  O2 at home is still pending.  Past Medical History  Diagnosis Date  . Hypertension   . Anemia   . Normocytic hypochromic anemia 08/03/2014  . Leukopenia 08/03/2014  . Claustrophobia 10/05/2014    has Primary osteoarthritis of both knees; Normocytic hypochromic anemia; Leukopenia; Multiple myeloma ([place]); and Claustrophobia on her problem list.     has No Known Allergies.  Current Outpatient Prescriptions on File Prior to Visit  Medication Sig Dispense Refill  . acyclovir (ZOVIRAX) 400 MG tablet Take 400 mg by mouth 2 (two) times daily.     Marland Kitchen ALPRAZolam (XANAX) 0.5 MG tablet Take 2 tablets 1 hour prior to PET scan. Then take 1 tab in waiting room if needed. (Patient not taking: Reported on 11/16/2014) 5 tablet 0  . aspirin 325 MG tablet Take 325 mg by mouth daily.    . Bortezomib (VELCADE IJ) Inject as directed. To begin on Days 1, 4, 8, & 11 every 21 days. To start approximately 10/02/14    . dexamethasone (DECADRON) 4 MG tablet Take 40 mg by mouth once a  week.     . diazepam (VALIUM) 5 MG tablet Take 1 tab 1 hour prior to procedure. Then take 1-2 tablets when you arrive if needed. (Patient not taking: Reported on 10/05/2014) 5 tablet 0  . hydrochlorothiazide (HYDRODIURIL) 25 MG tablet Take 25 mg by mouth every morning.    . ondansetron (ZOFRAN) 8 MG tablet Take 8 mg by mouth every 8 (eight) hours as needed for nausea or vomiting.     . prochlorperazine (COMPAZINE) 10 MG tablet Take 10 mg by mouth every 6 (six) hours as needed for nausea or vomiting.     Marland Kitchen REVLIMID  25 MG capsule Take 1 capsule (25 mg total) by mouth daily. Take 14 days on and 7 days off 14 capsule 0  . vitamin C (ASCORBIC ACID) 500 MG tablet Take 500 mg by mouth daily.     Current Facility-Administered Medications on File Prior to Visit  Medication Dose Route Frequency Provider Last Rate Last Dose  . 0.9 %  sodium chloride infusion   Intravenous Once [employer], PA-C      . bortezomib SQ (VELCADE) chemo injection 3.25 mg  1.3 mg/m2 (Treatment Plan Actual) Subcutaneous Once Patrici Ranks, MD      . heparin lock flush 100 unit/mL  500 Units Intravenous Once Patrici Ranks, MD      . methylPREDNISolone sodium succinate (SOLU-MEDROL) 125 mg/2 mL injection 60 mg  60 mg Intravenous Once Patrici Ranks, MD      . ondansetron (ZOFRAN) tablet 8 mg  8 mg Oral Once Patrici Ranks, MD      . sodium chloride 0.9 % injection 10 mL  10 mL Intravenous Once Patrici Ranks, MD      . zolendronic acid (ZOMETA) 4 mg in sodium chloride 0.9 % 100 mL IVPB  4 mg Intravenous Once [employer], PA-C        Past Surgical History  Procedure Laterality Date  . Abdominal hysterectomy    . 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   PHYSICAL EXAMINATION  ECOG PERFORMANCE STATUS: 1 - Symptomatic but completely ambulatory  Filed Vitals:   11/23/14 1001  BP: 129/57  Pulse: 84  Temp: 98.4 F (36.9 C)  Resp: 22    GENERAL:alert, no distress, well nourished, well developed, comfortable, cooperative, obese and smiling SKIN: skin color, texture, turgor are normal, no rashes or significant lesions HEAD: Normocephalic, No masses, lesions, tenderness or abnormalities EYES: normal,  PERRLA, EOMI, Conjunctiva are pink and non-injected EARS: External ears normal OROPHARYNX:lips, buccal mucosa, and tongue normal and mucous membranes are moist  NECK: supple, no adenopathy, thyroid normal size, non-tender, without nodularity, no stridor, non-tender, trachea midline LYMPH:  no palpable lymphadenopathy BREAST:not examined LUNGS: clear to auscultation and percussion, decreased breath sounds HEART: regular rate & rhythm, no murmurs, no gallops, S1 normal and S2 normal ABDOMEN:abdomen soft, non-tender, obese and normal bowel sounds BACK: Back symmetric, no curvature., No CVA tenderness EXTREMITIES:less then 2 second capillary refill, no joint deformities, effusion, or inflammation, no edema, no skin discoloration, no clubbing, no cyanosis  NEURO: alert & oriented x 3 with fluent speech, no focal motor/sensory deficits, gait normal   LABORATORY DATA: CBC  Component Value Date/Time   WBC 3.8* 11/23/2014 1042   RBC 3.78* 11/23/2014 1042   RBC 3.63* 08/03/2014 1546   HGB 9.5* 11/23/2014 1042   HCT 30.7* 11/23/2014 1042   PLT 186 11/23/2014 1042   MCV 81.2 11/23/2014 1042   MCH 25.1* 11/23/2014 1042   MCHC 30.9 11/23/2014 1042   RDW 21.0* 11/23/2014 1042   LYMPHSABS 0.7 11/23/2014 1042   MONOABS 0.1 11/23/2014 1042   EOSABS 0.1 11/23/2014 1042   BASOSABS 0.0 11/23/2014 1042      Chemistry      Component Value Date/Time   NA 137 11/23/2014 1042   K 3.3* 11/23/2014 1042   CL 103 11/23/2014 1042   CO2 26 11/23/2014 1042   BUN 14 11/23/2014 1042   CREATININE 0.99 11/23/2014 1042      Component Value Date/Time   CALCIUM 9.1 11/23/2014 1042   ALKPHOS 50 11/23/2014 1042   AST 23 11/23/2014 1042   ALT 21 11/23/2014 1042   BILITOT 0.9 11/23/2014 1042        PENDING LABS:   RADIOGRAPHIC STUDIES:  Ct Angio Chest Pe W/cm &/or Wo Cm  11/02/2014  CLINICAL DATA:  Shortness of breath for 6 weeks.  Multiple myeloma. EXAM: CT ANGIOGRAPHY CHEST WITH CONTRAST  TECHNIQUE: Multidetector CT imaging of the chest was performed using the standard protocol during bolus administration of intravenous contrast. Multiplanar CT image reconstructions and MIPs were obtained to evaluate the vascular anatomy. CONTRAST:  31m OMNIPAQUE IOHEXOL 350 MG/ML SOLN COMPARISON:  09/30/2014 FINDINGS: The study has technical limitations due to body habitus and poor opacification of the pulmonary arteries. There is no evidence for a large or central pulmonary embolism but limited evaluation of the pulmonary arteries distal to the proximal lobar branches. There is no significant pericardial or pleural fluid. Fullness in the right thyroid lobe and cannot exclude right thyroid nodule. No significant chest lymphadenopathy. No acute abnormality in the visualized upper abdomen. The trachea and mainstem bronchi are patent. There is an 8 mm density along the right minor fissure which could represent focal pleural thickening but indeterminate. Patchy densities in lower lobes are suggestive for atelectasis, left side greater than right. No large areas of airspace disease. No acute bone abnormality. Review of the MIP images confirms the above findings. IMPRESSION: Limited evaluation for pulmonary embolism due to body habitus and poor opacification of pulmonary arteries. No evidence for a large or central pulmonary embolism as described. Volume loss in the lower lobes, left side greater than right. Asymmetry of the thyroid tissue, right side greater the left. This could be further characterized with ultrasound. 8 mm density along the right minor fissure could represent focal pleural thickening but indeterminate. If the patient is at high risk for bronchogenic carcinoma, follow-up chest CT at 3-6 months is recommended. If the patient is at low risk for bronchogenic carcinoma, follow-up chest CT at 6-12 months is recommended. This recommendation follows the consensus statement: Guidelines for Management of Small  Pulmonary Nodules Detected on CT Scans: A Statement from the [place] published in Radiology 2005; 237:395-400. Electronically Signed   By: AMarkus DaftM.D.   On: 11/02/2014 14:18     PATHOLOGY:    ASSESSMENT AND PLAN:  Multiple myeloma Stage II, multiple myeloma, IgG kappa, serum albumin 3.5 g/dL with high risk cytogenetics.  Oncology history is updated.  Labs today: CBC diff, CMET  Zometa IV today.  Zometa therapy plan built and reviewed.  Patient education provided regarding risks,  benefits, alternatives, and side effects of this therapy including, but not limited to, hypocalcemia, ONJ, anaphylaxis, infusion site reaction, and death.  I will give Rx for Oscal + D 2 tabs daily.  She is educated to let her dentist know that she is on bisphosphonate therapy and we are to be informed of any elective dental procedure.  Continue follow-up with Dr. Luan Pulling for exertional hypoxemia.  This will need to be sorted out prior to referral to [facility] for consideration of transplant.  Return in 4 weeks for follow-up.    THERAPY PLAN:  Continue RVD as planned with the goal of consideration for transplant.  All questions were answered. The patient knows to call the clinic with any problems, questions or concerns. We can certainly see the patient much sooner if necessary.  Patient and plan discussed with Dr. Ancil Linsey and she is in agreement with the aforementioned.   This note is electronically signed by: Doy Mince 11/23/2014 11:27 AM

## 2014-11-23 ENCOUNTER — Encounter (HOSPITAL_BASED_OUTPATIENT_CLINIC_OR_DEPARTMENT_OTHER): Payer: Medicare Other

## 2014-11-23 ENCOUNTER — Encounter (HOSPITAL_BASED_OUTPATIENT_CLINIC_OR_DEPARTMENT_OTHER): Payer: Medicare Other | Admitting: Oncology

## 2014-11-23 ENCOUNTER — Encounter (HOSPITAL_COMMUNITY): Payer: Medicare Other

## 2014-11-23 ENCOUNTER — Encounter (HOSPITAL_COMMUNITY): Payer: Self-pay | Admitting: Oncology

## 2014-11-23 VITALS — BP 129/57 | HR 84 | Temp 98.4°F | Resp 22 | Wt 344.4 lb

## 2014-11-23 DIAGNOSIS — Z5112 Encounter for antineoplastic immunotherapy: Secondary | ICD-10-CM

## 2014-11-23 DIAGNOSIS — C9000 Multiple myeloma not having achieved remission: Secondary | ICD-10-CM

## 2014-11-23 DIAGNOSIS — E876 Hypokalemia: Secondary | ICD-10-CM

## 2014-11-23 DIAGNOSIS — D509 Iron deficiency anemia, unspecified: Secondary | ICD-10-CM | POA: Diagnosis not present

## 2014-11-23 DIAGNOSIS — T502X5A Adverse effect of carbonic-anhydrase inhibitors, benzothiadiazides and other diuretics, initial encounter: Secondary | ICD-10-CM

## 2014-11-23 DIAGNOSIS — D72819 Decreased white blood cell count, unspecified: Secondary | ICD-10-CM | POA: Diagnosis not present

## 2014-11-23 LAB — COMPREHENSIVE METABOLIC PANEL
ALT: 21 U/L (ref 14–54)
AST: 23 U/L (ref 15–41)
Albumin: 3.5 g/dL (ref 3.5–5.0)
Alkaline Phosphatase: 50 U/L (ref 38–126)
Anion gap: 8 (ref 5–15)
BILIRUBIN TOTAL: 0.9 mg/dL (ref 0.3–1.2)
BUN: 14 mg/dL (ref 6–20)
CALCIUM: 9.1 mg/dL (ref 8.9–10.3)
CO2: 26 mmol/L (ref 22–32)
CREATININE: 0.99 mg/dL (ref 0.44–1.00)
Chloride: 103 mmol/L (ref 101–111)
GFR calc Af Amer: >60 mL/min (ref >60)
GLUCOSE: 119 mg/dL — AB (ref 65–99)
Potassium: 3.3 mmol/L — ABNORMAL LOW (ref 3.5–5.1)
Sodium: 137 mmol/L (ref 135–145)
TOTAL PROTEIN: 7.2 g/dL (ref 6.5–8.1)

## 2014-11-23 LAB — CBC WITH DIFFERENTIAL/PLATELET
BASOS ABS: 0 10*3/uL (ref 0.0–0.1)
BASOS PCT: 0 %
EOS PCT: 2 %
Eosinophils Absolute: 0.1 10*3/uL (ref 0.0–0.7)
HEMATOCRIT: 30.7 % — AB (ref 36.0–46.0)
Hemoglobin: 9.5 g/dL — ABNORMAL LOW (ref 12.0–15.0)
LYMPHS ABS: 0.7 10*3/uL (ref 0.7–4.0)
LYMPHS PCT: 18 %
MCH: 25.1 pg — ABNORMAL LOW (ref 26.0–34.0)
MCHC: 30.9 g/dL (ref 30.0–36.0)
MCV: 81.2 fL (ref 78.0–100.0)
MONO ABS: 0.1 10*3/uL (ref 0.1–1.0)
Monocytes Relative: 2 %
NEUTROS ABS: 3 10*3/uL (ref 1.7–7.7)
NEUTROS PCT: 77 %
PLATELETS: 186 10*3/uL (ref 150–400)
RBC: 3.78 MIL/uL — ABNORMAL LOW (ref 3.87–5.11)
RDW: 21 % — AB (ref 11.5–15.5)
WBC: 3.8 10*3/uL — AB (ref 4.0–10.5)

## 2014-11-23 MED ORDER — SODIUM CHLORIDE 0.9 % IV SOLN
Freq: Once | INTRAVENOUS | Status: AC
  Administered 2014-11-23: 12:00:00 via INTRAVENOUS

## 2014-11-23 MED ORDER — BORTEZOMIB CHEMO SQ INJECTION 3.5 MG (2.5MG/ML)
1.3000 mg/m2 | Freq: Once | INTRAMUSCULAR | Status: AC
  Administered 2014-11-23: 3.25 mg via SUBCUTANEOUS
  Filled 2014-11-23: qty 3.25

## 2014-11-23 MED ORDER — POTASSIUM CHLORIDE CRYS ER 20 MEQ PO TBCR: 20.0000 meq | EXTENDED_RELEASE_TABLET | Freq: Every day | ORAL | Status: DC

## 2014-11-23 MED ORDER — ONDANSETRON HCL 4 MG PO TABS
8.0000 mg | ORAL_TABLET | Freq: Once | ORAL | Status: AC
  Administered 2014-11-23: 8 mg via ORAL
  Filled 2014-11-23: qty 2

## 2014-11-23 MED ORDER — ZOLEDRONIC ACID 4 MG/5ML IV CONC
4.0000 mg | Freq: Once | INTRAVENOUS | Status: AC
  Administered 2014-11-23: 4 mg via INTRAVENOUS
  Filled 2014-11-23: qty 5

## 2014-11-23 MED ORDER — CALCIUM CARBONATE-VITAMIN D 500-200 MG-UNIT PO TABS: 2.0000 | ORAL_TABLET | Freq: Every day | ORAL | Status: DC

## 2014-11-23 NOTE — Progress Notes (Signed)
Tolerated Zometa infusion well.

## 2014-11-23 NOTE — Patient Instructions (Signed)
[place] at [facility] Discharge Instructions  RECOMMENDATIONS MADE BY THE CONSULTANT AND ANY TEST RESULTS WILL BE SENT TO YOUR REFERRING PHYSICIAN.  Exam done and seen today by Kirby Crigler PA-C Return in 4 weeks  Thank you for choosing [place] at [facility] to provide your oncology and hematology care.  To afford each patient quality time with our provider, please arrive at least 15 minutes before your scheduled appointment time.    You need to re-schedule your appointment should you arrive 10 or more minutes late.  We strive to give you quality time with our providers, and arriving late affects you and other patients whose appointments are after yours.  Also, if you no show three or more times for appointments you may be dismissed from the clinic at the providers discretion.     Again, thank you for choosing [facility].  Our hope is that these requests will decrease the amount of time that you wait before being seen by our physicians.       _____________________________________________________________  Should you have questions after your visit to [facility], please contact our office at [phone] between the hours of 8:30 a.m. and 4:30 p.m.  Voicemails left after 4:30 p.m. will not be returned until the following business day.  For prescription refill requests, have your pharmacy contact our office.

## 2014-11-23 NOTE — Progress Notes (Signed)
Labs drawn

## 2014-11-23 NOTE — Patient Instructions (Signed)
[facility] Discharge Instructions for Patients Receiving Chemotherapy  Today you received the following chemotherapy agents Velcade injection. You also received a Zometa infusion. Zometa infusion monthly. Velcade injection weekly. Return as scheduled.  If you develop nausea and vomiting that is not controlled by your nausea medication, call the clinic. If it is after clinic hours your family physician or the after hours number for the clinic or go to the Emergency Department. BELOW ARE SYMPTOMS THAT SHOULD BE REPORTED IMMEDIATELY:  *FEVER GREATER THAN 101.0 F  *CHILLS WITH OR WITHOUT FEVER  NAUSEA AND VOMITING THAT IS NOT CONTROLLED WITH YOUR NAUSEA MEDICATION  *UNUSUAL SHORTNESS OF BREATH  *UNUSUAL BRUISING OR BLEEDING  TENDERNESS IN MOUTH AND THROAT WITH OR WITHOUT PRESENCE OF ULCERS  *URINARY PROBLEMS  *BOWEL PROBLEMS  UNUSUAL RASH Items with * indicate a potential emergency and should be followed up as soon as possible.  I have been informed and understand all the instructions given to me. I know to contact the clinic, my physician, or go to the Emergency Department if any problems should occur. I do not have any questions at this time, but understand that I may call the clinic during office hours or the Patient Navigator at [phone] should I have any questions or need assistance in obtaining follow up care.    __________________________________________  _____________  __________ Signature of Patient or Authorized Representative            Date                   Time    __________________________________________ Nurse's Signature

## 2014-11-23 NOTE — Addendum Note (Signed)
Addended by: Gerhard Perches on: 11/23/2014 03:13 PM   Modules accepted: Medications

## 2014-11-25 ENCOUNTER — Other Ambulatory Visit (HOSPITAL_COMMUNITY): Payer: Self-pay | Admitting: *Deleted

## 2014-11-26 ENCOUNTER — Inpatient Hospital Stay (HOSPITAL_COMMUNITY): Payer: 59

## 2014-11-30 ENCOUNTER — Encounter (HOSPITAL_BASED_OUTPATIENT_CLINIC_OR_DEPARTMENT_OTHER): Payer: Medicare Other

## 2014-11-30 ENCOUNTER — Other Ambulatory Visit (HOSPITAL_COMMUNITY): Payer: Self-pay | Admitting: *Deleted

## 2014-11-30 ENCOUNTER — Encounter (HOSPITAL_COMMUNITY): Payer: Medicare Other

## 2014-11-30 VITALS — BP 130/63 | HR 87 | Temp 98.6°F | Resp 20 | Wt 336.2 lb

## 2014-11-30 DIAGNOSIS — Z5112 Encounter for antineoplastic immunotherapy: Secondary | ICD-10-CM | POA: Diagnosis present

## 2014-11-30 DIAGNOSIS — C9001 Multiple myeloma in remission: Secondary | ICD-10-CM

## 2014-11-30 DIAGNOSIS — C9000 Multiple myeloma not having achieved remission: Secondary | ICD-10-CM | POA: Diagnosis not present

## 2014-11-30 DIAGNOSIS — D509 Iron deficiency anemia, unspecified: Secondary | ICD-10-CM | POA: Diagnosis not present

## 2014-11-30 DIAGNOSIS — D72819 Decreased white blood cell count, unspecified: Secondary | ICD-10-CM | POA: Diagnosis not present

## 2014-11-30 LAB — COMPREHENSIVE METABOLIC PANEL
ALBUMIN: 3.6 g/dL (ref 3.5–5.0)
ALK PHOS: 56 U/L (ref 38–126)
ALT: 21 U/L (ref 14–54)
AST: 23 U/L (ref 15–41)
Anion gap: 9 (ref 5–15)
BUN: 13 mg/dL (ref 6–20)
CALCIUM: 8.7 mg/dL — AB (ref 8.9–10.3)
CO2: 24 mmol/L (ref 22–32)
CREATININE: 0.98 mg/dL (ref 0.44–1.00)
Chloride: 100 mmol/L — ABNORMAL LOW (ref 101–111)
GFR calc Af Amer: >60 mL/min (ref >60)
GFR calc non Af Amer: >60 mL/min (ref >60)
GLUCOSE: 95 mg/dL (ref 65–99)
Potassium: 3.4 mmol/L — ABNORMAL LOW (ref 3.5–5.1)
SODIUM: 133 mmol/L — AB (ref 135–145)
Total Bilirubin: 0.8 mg/dL (ref 0.3–1.2)
Total Protein: 7.9 g/dL (ref 6.5–8.1)

## 2014-11-30 LAB — CBC WITH DIFFERENTIAL/PLATELET
BASOS PCT: 0 %
Basophils Absolute: 0 10*3/uL (ref 0.0–0.1)
EOS ABS: 0.1 10*3/uL (ref 0.0–0.7)
Eosinophils Relative: 1 %
HEMATOCRIT: 32.1 % — AB (ref 36.0–46.0)
HEMOGLOBIN: 10 g/dL — AB (ref 12.0–15.0)
Lymphocytes Relative: 21 %
Lymphs Abs: 1.2 10*3/uL (ref 0.7–4.0)
MCH: 24.9 pg — ABNORMAL LOW (ref 26.0–34.0)
MCHC: 31.2 g/dL (ref 30.0–36.0)
MCV: 79.9 fL (ref 78.0–100.0)
Monocytes Absolute: 0.5 10*3/uL (ref 0.1–1.0)
Monocytes Relative: 8 %
NEUTROS ABS: 3.9 10*3/uL (ref 1.7–7.7)
NEUTROS PCT: 67 %
Platelets: 224 10*3/uL (ref 150–400)
RBC: 4.02 MIL/uL (ref 3.87–5.11)
RDW: 19.8 % — ABNORMAL HIGH (ref 11.5–15.5)
WBC: 5.8 10*3/uL (ref 4.0–10.5)

## 2014-11-30 MED ORDER — BORTEZOMIB CHEMO SQ INJECTION 3.5 MG (2.5MG/ML)
1.3000 mg/m2 | Freq: Once | INTRAMUSCULAR | Status: AC
  Administered 2014-11-30: 3.25 mg via SUBCUTANEOUS
  Filled 2014-11-30: qty 3.25

## 2014-11-30 MED ORDER — REVLIMID 25 MG PO CAPS: 25.0000 mg | ORAL_CAPSULE | Freq: Every day | ORAL | Status: DC

## 2014-11-30 MED ORDER — ONDANSETRON HCL 4 MG PO TABS: 8.0000 mg | ORAL_TABLET | Freq: Once | ORAL | Status: DC

## 2014-11-30 NOTE — Patient Instructions (Signed)
[facility] Discharge Instructions for Patients Receiving Chemotherapy  Today you received the following chemotherapy agent: Velcade.   If you develop nausea and vomiting, or diarrhea that is not controlled by your medication, call the clinic.  The clinic phone number is [phone]. Office hours are Monday-Friday 8:30am-5:00pm.  BELOW ARE SYMPTOMS THAT SHOULD BE REPORTED IMMEDIATELY:  *FEVER GREATER THAN 101.0 F  *CHILLS WITH OR WITHOUT FEVER  NAUSEA AND VOMITING THAT IS NOT CONTROLLED WITH YOUR NAUSEA MEDICATION  *UNUSUAL SHORTNESS OF BREATH  *UNUSUAL BRUISING OR BLEEDING  TENDERNESS IN MOUTH AND THROAT WITH OR WITHOUT PRESENCE OF ULCERS  *URINARY PROBLEMS  *BOWEL PROBLEMS  UNUSUAL RASH Items with * indicate a potential emergency and should be followed up as soon as possible. If you have an emergency after office hours please contact your primary care physician or go to the nearest emergency department.  Please call the clinic during office hours if you have any questions or concerns.   You may also contact the Patient Navigator at [phone] should you have any questions or need assistance in obtaining follow up care. _____________________________________________________________________ Have you asked about our STAR program?    STAR stands for Survivorship Training and Rehabilitation, and this is a nationally recognized cancer care program that focuses on survivorship and rehabilitation.  Cancer and cancer treatments may cause problems, such as, pain, making you feel tired and keeping you from doing the things that you need or want to do. Cancer rehabilitation can help. Our goal is to reduce these troubling effects and help you have the best quality of life possible.  You may receive a survey from a nurse that asks questions about your current state of health.  Based on the survey results, all eligible patients will be referred to the [facility] program for an  evaluation so we can better serve you! A frequently asked questions sheet is available upon request.

## 2014-11-30 NOTE — Progress Notes (Signed)
Corrin Parker presents today for injection per MD orders. Velcade administered SQ in left Abdomen. Administration without incident. Patient tolerated well.

## 2014-12-07 ENCOUNTER — Encounter (HOSPITAL_COMMUNITY): Payer: Self-pay

## 2014-12-07 ENCOUNTER — Encounter (HOSPITAL_BASED_OUTPATIENT_CLINIC_OR_DEPARTMENT_OTHER): Payer: Medicare Other

## 2014-12-07 VITALS — BP 132/76 | HR 82 | Temp 98.8°F | Resp 20

## 2014-12-07 DIAGNOSIS — C9000 Multiple myeloma not having achieved remission: Secondary | ICD-10-CM

## 2014-12-07 DIAGNOSIS — Z5112 Encounter for antineoplastic immunotherapy: Secondary | ICD-10-CM

## 2014-12-07 DIAGNOSIS — D509 Iron deficiency anemia, unspecified: Secondary | ICD-10-CM | POA: Diagnosis not present

## 2014-12-07 DIAGNOSIS — D72819 Decreased white blood cell count, unspecified: Secondary | ICD-10-CM | POA: Diagnosis not present

## 2014-12-07 LAB — COMPREHENSIVE METABOLIC PANEL
ALBUMIN: 3.5 g/dL (ref 3.5–5.0)
ALK PHOS: 51 U/L (ref 38–126)
ALT: 15 U/L (ref 14–54)
ANION GAP: 9 (ref 5–15)
AST: 22 U/L (ref 15–41)
BILIRUBIN TOTAL: 0.7 mg/dL (ref 0.3–1.2)
BUN: 17 mg/dL (ref 6–20)
CALCIUM: 9.4 mg/dL (ref 8.9–10.3)
CO2: 27 mmol/L (ref 22–32)
Chloride: 101 mmol/L (ref 101–111)
Creatinine, Ser: 0.97 mg/dL (ref 0.44–1.00)
GLUCOSE: 130 mg/dL — AB (ref 65–99)
Potassium: 3.3 mmol/L — ABNORMAL LOW (ref 3.5–5.1)
Sodium: 137 mmol/L (ref 135–145)
TOTAL PROTEIN: 7.4 g/dL (ref 6.5–8.1)

## 2014-12-07 LAB — CBC WITH DIFFERENTIAL/PLATELET
Basophils Absolute: 0 10*3/uL (ref 0.0–0.1)
Basophils Relative: 0 %
Eosinophils Absolute: 0.1 10*3/uL (ref 0.0–0.7)
Eosinophils Relative: 2 %
HEMATOCRIT: 31.6 % — AB (ref 36.0–46.0)
HEMOGLOBIN: 9.6 g/dL — AB (ref 12.0–15.0)
LYMPHS ABS: 0.8 10*3/uL (ref 0.7–4.0)
LYMPHS PCT: 22 %
MCH: 24.5 pg — AB (ref 26.0–34.0)
MCHC: 30.4 g/dL (ref 30.0–36.0)
MCV: 80.6 fL (ref 78.0–100.0)
MONO ABS: 0.1 10*3/uL (ref 0.1–1.0)
MONOS PCT: 2 %
NEUTROS ABS: 2.7 10*3/uL (ref 1.7–7.7)
NEUTROS PCT: 73 %
Platelets: 230 10*3/uL (ref 150–400)
RBC: 3.92 MIL/uL (ref 3.87–5.11)
RDW: 19.6 % — ABNORMAL HIGH (ref 11.5–15.5)
WBC: 3.7 10*3/uL — ABNORMAL LOW (ref 4.0–10.5)

## 2014-12-07 LAB — C-REACTIVE PROTEIN: CRP: 1.5 mg/dL — AB (ref <1.0)

## 2014-12-07 LAB — LACTATE DEHYDROGENASE: LDH: 163 U/L (ref 98–192)

## 2014-12-07 LAB — SEDIMENTATION RATE: Sed Rate: 60 mm/hr — ABNORMAL HIGH (ref 0–22)

## 2014-12-07 MED ORDER — BORTEZOMIB CHEMO SQ INJECTION 3.5 MG (2.5MG/ML)
1.3000 mg/m2 | Freq: Once | INTRAMUSCULAR | Status: AC
  Administered 2014-12-07: 3.25 mg via SUBCUTANEOUS
  Filled 2014-12-07: qty 3.25

## 2014-12-07 MED ORDER — ONDANSETRON HCL 4 MG PO TABS
8.0000 mg | ORAL_TABLET | Freq: Once | ORAL | Status: AC
Start: 2014-12-07 — End: 2014-12-07
  Administered 2014-12-07: 8 mg via ORAL
  Filled 2014-12-07: qty 2

## 2014-12-07 NOTE — Patient Instructions (Signed)
[facility] Discharge Instructions for Patients Receiving Chemotherapy  Today you received the following chemotherapy agents:  Velcade  If you develop nausea and vomiting, or diarrhea that is not controlled by your medication, call the clinic.  The clinic phone number is [phone]. Office hours are Monday-Friday 8:30am-5:00pm.  BELOW ARE SYMPTOMS THAT SHOULD BE REPORTED IMMEDIATELY:  *FEVER GREATER THAN 101.0 F  *CHILLS WITH OR WITHOUT FEVER  NAUSEA AND VOMITING THAT IS NOT CONTROLLED WITH YOUR NAUSEA MEDICATION  *UNUSUAL SHORTNESS OF BREATH  *UNUSUAL BRUISING OR BLEEDING  TENDERNESS IN MOUTH AND THROAT WITH OR WITHOUT PRESENCE OF ULCERS  *URINARY PROBLEMS  *BOWEL PROBLEMS  UNUSUAL RASH Items with * indicate a potential emergency and should be followed up as soon as possible. If you have an emergency after office hours please contact your primary care physician or go to the nearest emergency department.  Please call the clinic during office hours if you have any questions or concerns.   You may also contact the Patient Navigator at [phone] should you have any questions or need assistance in obtaining follow up care. _____________________________________________________________________ Have you asked about our STAR program?    STAR stands for Survivorship Training and Rehabilitation, and this is a nationally recognized cancer care program that focuses on survivorship and rehabilitation.  Cancer and cancer treatments may cause problems, such as, pain, making you feel tired and keeping you from doing the things that you need or want to do. Cancer rehabilitation can help. Our goal is to reduce these troubling effects and help you have the best quality of life possible.  You may receive a survey from a nurse that asks questions about your current state of health.  Based on the survey results, all eligible patients will be referred to the STAR program for an  evaluation so we can better serve you! A frequently asked questions sheet is available upon request.           

## 2014-12-07 NOTE — Progress Notes (Signed)
Labs drawn

## 2014-12-08 LAB — KAPPA/LAMBDA LIGHT CHAINS
KAPPA FREE LGHT CHN: 42.83 mg/L — AB (ref 3.30–19.40)
KAPPA, LAMDA LIGHT CHAIN RATIO: 1.79 — AB (ref 0.26–1.65)
LAMDA FREE LIGHT CHAINS: 23.98 mg/L (ref 5.71–26.30)

## 2014-12-08 LAB — MULTIPLE MYELOMA PANEL, SERUM
ALBUMIN/GLOB SERPL: 1 (ref 0.7–1.7)
ALPHA2 GLOB SERPL ELPH-MCNC: 0.7 g/dL (ref 0.4–1.0)
Albumin SerPl Elph-Mcnc: 3.3 g/dL (ref 2.9–4.4)
Alpha 1: 0.3 g/dL (ref 0.0–0.4)
B-Globulin SerPl Elph-Mcnc: 1 g/dL (ref 0.7–1.3)
Gamma Glob SerPl Elph-Mcnc: 1.7 g/dL (ref 0.4–1.8)
Globulin, Total: 3.6 g/dL (ref 2.2–3.9)
IGA: 76 mg/dL — AB (ref 87–352)
IGM, SERUM: 164 mg/dL (ref 26–217)
IgG (Immunoglobin G), Serum: 1612 mg/dL — ABNORMAL HIGH (ref 700–1600)
M Protein SerPl Elph-Mcnc: 1 g/dL — ABNORMAL HIGH
Total Protein ELP: 6.9 g/dL (ref 6.0–8.5)

## 2014-12-08 LAB — BETA 2 MICROGLOBULIN, SERUM: Beta-2 Microglobulin: 4 mg/L — ABNORMAL HIGH (ref 0.6–2.4)

## 2014-12-11 ENCOUNTER — Other Ambulatory Visit (HOSPITAL_COMMUNITY): Payer: Self-pay

## 2014-12-13 NOTE — Assessment & Plan Note (Addendum)
Stage II, multiple myeloma, IgG kappa, serum albumin 3.5 g/dL with high risk cytogenetics.  Oncology history is up to date.  Labs today: CBC diff, CMET  Continue follow-up with Dr. Luan Pulling for exertional hypoxemia.  This will need to be sorted out prior to referral to [facility] for consideration of transplant.  She starts Revlimid today.  Continue with Velcade as prescribed.  Continue with Zometa as scheduled.  She is due for her next infusion on 11/14.  Return in 4-5 weeks for follow-up.

## 2014-12-13 NOTE — Progress Notes (Signed)
[name], PA-C [address] [place] [address]  Multiple myeloma not having achieved remission ([place]) - Plan: ondansetron (ZOFRAN) tablet 8 mg, bortezomib SQ (VELCADE) chemo injection 3.25 mg  CURRENT THERAPY: RVD + Zometa  INTERVAL HISTORY: Kelli Hurley 59 y.o. female returns for followup of Stage II, multiple myeloma, IgG kappa, serum albumin 3.5 g/dL with high risk cytogenetics.    Multiple myeloma ([place])   08/07/2014 Imaging Bone Survey- No lytic lesions are noted in the visualized skeleton.   09/03/2014 Bone Marrow Transplant Bone Marrow, Aspirate,Biopsy, and Clot - NORMOCELLULAR BONE MARROW FOR AGE WITH PLASMA CELL NEOPLASM.  The plasma cell component is increased in the marrow representing an estimated 18% of all cells.   09/03/2014 Pathology Results Cytogenetics with 13q-, 17p- (high risk disease)   09/23/2014 Initial Diagnosis Multiple myeloma   09/30/2014 PET scan No abnormal hypermetabolism in the neck, chest, abdomen or pelvis.   10/05/2014 -  Chemotherapy RVD   10/28/2014 Treatment Plan Change Issues related to getting Revlimid in a timely fashion, therefore, she received her Revlimid on 9/21 resulting in a 12 day cycle this time instead of a 14 day cycle   11/02/2014 Imaging CTA chest- No evidence for a large or central pulmonary embolism as described.  8 mm density along the right minor fissure could represent focal pleural thickening but indeterminate. If the patient is at high risk for bronchogenic carcinoma, follow-up c   11/23/2014 Miscellaneous Zometa 4 mg IV monthly    I personally reviewed and went over laboratory results with the patient.  The results are noted within this dictation.  Her labs continue to demonstrate a response to therapy.    She denies any jaw or tooth issues presently.  She denies any PN.   She continues to tolerate therapy well without any issues.    She starts Revlimid today.  She notes that her breathing is  improved and she is working with Dr. Luan Pulling with this.    Past Medical History  Diagnosis Date  . Hypertension   . Anemia   . Normocytic hypochromic anemia 08/03/2014  . Leukopenia 08/03/2014  . Claustrophobia 10/05/2014    has Primary osteoarthritis of both knees; Normocytic hypochromic anemia; Leukopenia; Multiple myeloma ([place]); and Claustrophobia on her problem list.     has No Known Allergies.  Current Outpatient Prescriptions on File Prior to Visit  Medication Sig Dispense Refill  . acyclovir (ZOVIRAX) 400 MG tablet Take 400 mg by mouth 2 (two) times daily.     Marland Kitchen aspirin 325 MG tablet Take 325 mg by mouth daily.    . Bortezomib (VELCADE IJ) Inject as directed. Days 1, 8, 15 every 21 days. To start approximately 10/02/14    . calcium-vitamin D (OSCAL WITH D) 500-200 MG-UNIT tablet Take 2 tablets by mouth daily with breakfast. 60 tablet 5  . dexamethasone (DECADRON) 4 MG tablet Take 40 mg by mouth once a week.     . hydrochlorothiazide (HYDRODIURIL) 25 MG tablet Take 25 mg by mouth every morning.    . potassium chloride SA (K-DUR,KLOR-CON) 20 MEQ tablet Take 1 tablet (20 mEq total) by mouth daily. 30 tablet 3  . REVLIMID 25 MG capsule Take 1 capsule (25 mg total) by mouth daily. Take 14 days on and 7 days off 28 capsule 0  . vitamin C (ASCORBIC ACID) 500 MG tablet Take 500 mg by mouth daily.    Marland Kitchen ALPRAZolam (XANAX) 0.5  MG tablet Take 2 tablets 1 hour prior to PET scan. Then take 1 tab in waiting room if needed. (Patient not taking: Reported on 11/16/2014) 5 tablet 0  . diazepam (VALIUM) 5 MG tablet Take 1 tab 1 hour prior to procedure. Then take 1-2 tablets when you arrive if needed. (Patient not taking: Reported on 10/05/2014) 5 tablet 0  . ondansetron (ZOFRAN) 8 MG tablet Take 8 mg by mouth every 8 (eight) hours as needed for nausea or vomiting.     . prochlorperazine (COMPAZINE) 10 MG tablet Take 10 mg by mouth every 6 (six) hours as needed for nausea or vomiting.      Current  Facility-Administered Medications on File Prior to Visit  Medication Dose Route Frequency Provider Last Rate Last Dose  . heparin lock flush 100 unit/mL  500 Units Intravenous Once Patrici Ranks, MD      . methylPREDNISolone sodium succinate (SOLU-MEDROL) 125 mg/2 mL injection 60 mg  60 mg Intravenous Once Patrici Ranks, MD      . sodium chloride 0.9 % injection 10 mL  10 mL Intravenous Once Patrici Ranks, MD        Past Surgical History  Procedure Laterality Date  . Abdominal hysterectomy    . 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   PHYSICAL EXAMINATION  ECOG PERFORMANCE STATUS: 1 - Symptomatic but completely ambulatory  Filed Vitals:   12/14/14 1201  BP: 140/77  Pulse: 83  Temp: 98.4 F (36.9 C)  Resp: 20    GENERAL:alert, no distress, well nourished, well developed, comfortable, cooperative, obese and smiling, accompanied by her sister. SKIN: skin color, texture, turgor are normal, no rashes or significant lesions HEAD: Normocephalic, No masses, lesions, tenderness or abnormalities EYES: normal, PERRLA, EOMI, Conjunctiva are pink and non-injected EARS: External ears normal OROPHARYNX:lips, buccal mucosa, and tongue normal and mucous membranes are moist  NECK: supple, no adenopathy, thyroid normal size, non-tender, without nodularity, no stridor, non-tender, trachea midline LYMPH:  no palpable lymphadenopathy BREAST:not examined LUNGS: clear to auscultation and percussion, decreased breath sounds HEART: regular rate & rhythm, no murmurs, no gallops, S1 normal and S2 normal ABDOMEN:abdomen soft, non-tender, obese and normal bowel sounds BACK: Back  symmetric, no curvature., No CVA tenderness EXTREMITIES:less then 2 second capillary refill, no joint deformities, effusion, or inflammation, no edema, no skin discoloration, no clubbing, no cyanosis  NEURO: alert & oriented x 3 with fluent speech, no focal motor/sensory deficits, gait normal   LABORATORY DATA: CBC    Component Value Date/Time   WBC 5.4 12/14/2014 1119   RBC 4.08 12/14/2014 1119   RBC 3.63* 08/03/2014 1546   HGB 10.4* 12/14/2014 1119   HCT 33.3* 12/14/2014 1119   PLT 279 12/14/2014 1119   MCV 81.6 12/14/2014 1119   MCH 25.5* 12/14/2014 1119   MCHC 31.2 12/14/2014 1119   RDW 19.2* 12/14/2014 1119   LYMPHSABS 0.8 12/14/2014 1119   MONOABS 0.2 12/14/2014 1119   EOSABS 0.0 12/14/2014 1119   BASOSABS 0.0 12/14/2014 1119      Chemistry      Component Value Date/Time   NA 138 12/14/2014 1119   K 3.8 12/14/2014 1119   CL 104 12/14/2014 1119  CO2 24 12/14/2014 1119   BUN 18 12/14/2014 1119   CREATININE 0.94 12/14/2014 1119      Component Value Date/Time   CALCIUM 9.4 12/14/2014 1119   ALKPHOS 46 12/14/2014 1119   AST 20 12/14/2014 1119   ALT 13* 12/14/2014 1119   BILITOT 0.7 12/14/2014 1119     Lab Results  Component Value Date   PROT 7.4 12/14/2014   IGGSERUM 1612* 12/07/2014   IGA 76* 12/07/2014   IGMSERUM 164 12/07/2014   KPAFRELGTCHN 42.83* 12/07/2014   LAMBDASER 23.98 12/07/2014   KAPLAMBRATIO 1.79* 12/07/2014     PENDING LABS:   RADIOGRAPHIC STUDIES:  No results found.   PATHOLOGY:    ASSESSMENT AND PLAN:  Multiple myeloma Stage II, multiple myeloma, IgG kappa, serum albumin 3.5 g/dL with high risk cytogenetics.  Oncology history is up to date.  Labs today: CBC diff, CMET  Continue follow-up with Dr. Luan Pulling for exertional hypoxemia.  This will need to be sorted out prior to referral to [facility] for consideration of transplant.  She starts Revlimid today.  Continue with Velcade as prescribed.  Continue with Zometa as scheduled.   She is due for her next infusion on 11/14.  Return in 4-5 weeks for follow-up.   THERAPY PLAN:  Continue RVD as planned with the goal of consideration for transplant.  All questions were answered. The patient knows to call the clinic with any problems, questions or concerns. We can certainly see the patient much sooner if necessary.  Patient and plan discussed with Dr. Ancil Linsey and she is in agreement with the aforementioned.   This note is electronically signed by: Doy Mince 12/14/2014 5:12 PM

## 2014-12-14 ENCOUNTER — Encounter (HOSPITAL_BASED_OUTPATIENT_CLINIC_OR_DEPARTMENT_OTHER): Payer: Medicare Other | Admitting: Oncology

## 2014-12-14 ENCOUNTER — Encounter (HOSPITAL_COMMUNITY): Payer: Medicare Other

## 2014-12-14 ENCOUNTER — Encounter (HOSPITAL_COMMUNITY): Payer: Self-pay | Admitting: Oncology

## 2014-12-14 ENCOUNTER — Encounter (HOSPITAL_COMMUNITY): Payer: Medicare Other | Attending: Oncology

## 2014-12-14 VITALS — BP 140/77 | HR 83 | Temp 98.4°F | Resp 20 | Wt 337.2 lb

## 2014-12-14 DIAGNOSIS — D72819 Decreased white blood cell count, unspecified: Secondary | ICD-10-CM | POA: Insufficient documentation

## 2014-12-14 DIAGNOSIS — R0902 Hypoxemia: Secondary | ICD-10-CM | POA: Diagnosis not present

## 2014-12-14 DIAGNOSIS — C9000 Multiple myeloma not having achieved remission: Secondary | ICD-10-CM

## 2014-12-14 DIAGNOSIS — Z5112 Encounter for antineoplastic immunotherapy: Secondary | ICD-10-CM | POA: Diagnosis present

## 2014-12-14 DIAGNOSIS — D509 Iron deficiency anemia, unspecified: Secondary | ICD-10-CM | POA: Insufficient documentation

## 2014-12-14 LAB — CBC WITH DIFFERENTIAL/PLATELET
BASOS ABS: 0 10*3/uL (ref 0.0–0.1)
Basophils Relative: 0 %
Eosinophils Absolute: 0 10*3/uL (ref 0.0–0.7)
Eosinophils Relative: 1 %
HCT: 33.3 % — ABNORMAL LOW (ref 36.0–46.0)
HEMOGLOBIN: 10.4 g/dL — AB (ref 12.0–15.0)
LYMPHS ABS: 0.8 10*3/uL (ref 0.7–4.0)
LYMPHS PCT: 15 %
MCH: 25.5 pg — ABNORMAL LOW (ref 26.0–34.0)
MCHC: 31.2 g/dL (ref 30.0–36.0)
MCV: 81.6 fL (ref 78.0–100.0)
Monocytes Absolute: 0.2 10*3/uL (ref 0.1–1.0)
Monocytes Relative: 3 %
NEUTROS PCT: 81 %
Neutro Abs: 4.4 10*3/uL (ref 1.7–7.7)
Platelets: 279 10*3/uL (ref 150–400)
RBC: 4.08 MIL/uL (ref 3.87–5.11)
RDW: 19.2 % — ABNORMAL HIGH (ref 11.5–15.5)
WBC: 5.4 10*3/uL (ref 4.0–10.5)

## 2014-12-14 LAB — COMPREHENSIVE METABOLIC PANEL
ALBUMIN: 3.7 g/dL (ref 3.5–5.0)
ALT: 13 U/L — ABNORMAL LOW (ref 14–54)
ANION GAP: 10 (ref 5–15)
AST: 20 U/L (ref 15–41)
Alkaline Phosphatase: 46 U/L (ref 38–126)
BILIRUBIN TOTAL: 0.7 mg/dL (ref 0.3–1.2)
BUN: 18 mg/dL (ref 6–20)
CO2: 24 mmol/L (ref 22–32)
Calcium: 9.4 mg/dL (ref 8.9–10.3)
Chloride: 104 mmol/L (ref 101–111)
Creatinine, Ser: 0.94 mg/dL (ref 0.44–1.00)
Glucose, Bld: 118 mg/dL — ABNORMAL HIGH (ref 65–99)
POTASSIUM: 3.8 mmol/L (ref 3.5–5.1)
Sodium: 138 mmol/L (ref 135–145)
TOTAL PROTEIN: 7.4 g/dL (ref 6.5–8.1)

## 2014-12-14 MED ORDER — ONDANSETRON HCL 4 MG PO TABS
8.0000 mg | ORAL_TABLET | Freq: Once | ORAL | Status: AC
  Administered 2014-12-14: 8 mg via ORAL

## 2014-12-14 MED ORDER — ONDANSETRON HCL 4 MG PO TABS
ORAL_TABLET | ORAL | Status: AC
  Filled 2014-12-14: qty 2

## 2014-12-14 MED ORDER — BORTEZOMIB CHEMO SQ INJECTION 3.5 MG (2.5MG/ML)
1.3000 mg/m2 | Freq: Once | INTRAMUSCULAR | Status: AC
  Administered 2014-12-14: 3.25 mg via SUBCUTANEOUS
  Filled 2014-12-14: qty 3.25

## 2014-12-14 NOTE — Patient Instructions (Signed)
[place] at [facility] Discharge Instructions  RECOMMENDATIONS MADE BY THE CONSULTANT AND ANY TEST RESULTS WILL BE SENT TO YOUR REFERRING PHYSICIAN.  Lab work today. Velcade injection today as ordered. Continue revlimid as ordered. Continue velcade injection weekly as ordered. Continue zometa infusion every 4 weeks. MD appointment again in 4 weeks. Return as scheduled.  Thank you for choosing [employer] at [facility] to provide your oncology and hematology care.  To afford each patient quality time with our provider, please arrive at least 15 minutes before your scheduled appointment time.    You need to re-schedule your appointment should you arrive 10 or more minutes late.  We strive to give you quality time with our providers, and arriving late affects you and other patients whose appointments are after yours.  Also, if you no show three or more times for appointments you may be dismissed from the clinic at the providers discretion.     Again, thank you for choosing [facility].  Our hope is that these requests will decrease the amount of time that you wait before being seen by our physicians.       _____________________________________________________________  Should you have questions after your visit to [facility], please contact our office at [phone] between the hours of 8:30 a.m. and 4:30 p.m.  Voicemails left after 4:30 p.m. will not be returned until the following business day.  For prescription refill requests, have your pharmacy contact our office.

## 2014-12-14 NOTE — Progress Notes (Signed)
..  Corrin Parker presents today for injection per the provider's orders.  velcade administration without incident; see MAR for injection details.  Patient tolerated procedure well and without incident.  No questions or complaints noted at this time.

## 2014-12-21 ENCOUNTER — Encounter (HOSPITAL_COMMUNITY): Payer: Medicare Other | Attending: Oncology

## 2014-12-21 ENCOUNTER — Other Ambulatory Visit (HOSPITAL_COMMUNITY): Payer: Self-pay | Admitting: Oncology

## 2014-12-21 ENCOUNTER — Ambulatory Visit (HOSPITAL_COMMUNITY): Payer: Medicare Other | Admitting: Oncology

## 2014-12-21 ENCOUNTER — Encounter (HOSPITAL_BASED_OUTPATIENT_CLINIC_OR_DEPARTMENT_OTHER): Payer: Medicare Other

## 2014-12-21 VITALS — BP 116/66 | HR 82 | Temp 97.9°F | Resp 18 | Wt 353.2 lb

## 2014-12-21 DIAGNOSIS — Z5112 Encounter for antineoplastic immunotherapy: Secondary | ICD-10-CM

## 2014-12-21 DIAGNOSIS — C9000 Multiple myeloma not having achieved remission: Secondary | ICD-10-CM | POA: Diagnosis present

## 2014-12-21 DIAGNOSIS — D72819 Decreased white blood cell count, unspecified: Secondary | ICD-10-CM | POA: Diagnosis not present

## 2014-12-21 DIAGNOSIS — D509 Iron deficiency anemia, unspecified: Secondary | ICD-10-CM | POA: Diagnosis not present

## 2014-12-21 DIAGNOSIS — C9001 Multiple myeloma in remission: Secondary | ICD-10-CM

## 2014-12-21 LAB — COMPREHENSIVE METABOLIC PANEL
ALK PHOS: 52 U/L (ref 38–126)
ALT: 14 U/L (ref 14–54)
AST: 21 U/L (ref 15–41)
Albumin: 3.9 g/dL (ref 3.5–5.0)
Anion gap: 11 (ref 5–15)
BUN: 16 mg/dL (ref 6–20)
CALCIUM: 9.2 mg/dL (ref 8.9–10.3)
CO2: 24 mmol/L (ref 22–32)
CREATININE: 0.97 mg/dL (ref 0.44–1.00)
Chloride: 101 mmol/L (ref 101–111)
GFR calc non Af Amer: >60 mL/min (ref >60)
Glucose, Bld: 104 mg/dL — ABNORMAL HIGH (ref 65–99)
Potassium: 3.5 mmol/L (ref 3.5–5.1)
SODIUM: 136 mmol/L (ref 135–145)
Total Bilirubin: 0.5 mg/dL (ref 0.3–1.2)
Total Protein: 7.4 g/dL (ref 6.5–8.1)

## 2014-12-21 LAB — CBC WITH DIFFERENTIAL/PLATELET
Basophils Absolute: 0 10*3/uL (ref 0.0–0.1)
Basophils Relative: 0 %
Eosinophils Absolute: 0.1 10*3/uL (ref 0.0–0.7)
Eosinophils Relative: 1 %
HCT: 34.3 % — ABNORMAL LOW (ref 36.0–46.0)
HEMOGLOBIN: 10.7 g/dL — AB (ref 12.0–15.0)
LYMPHS ABS: 0.9 10*3/uL (ref 0.7–4.0)
LYMPHS PCT: 16 %
MCH: 25.5 pg — AB (ref 26.0–34.0)
MCHC: 31.2 g/dL (ref 30.0–36.0)
MCV: 81.7 fL (ref 78.0–100.0)
Monocytes Absolute: 0.1 10*3/uL (ref 0.1–1.0)
Monocytes Relative: 2 %
NEUTROS PCT: 80 %
Neutro Abs: 4.6 10*3/uL (ref 1.7–7.7)
Platelets: 228 10*3/uL (ref 150–400)
RBC: 4.2 MIL/uL (ref 3.87–5.11)
RDW: 19 % — ABNORMAL HIGH (ref 11.5–15.5)
WBC: 5.8 10*3/uL (ref 4.0–10.5)

## 2014-12-21 MED ORDER — BORTEZOMIB CHEMO SQ INJECTION 3.5 MG (2.5MG/ML)
1.3000 mg/m2 | Freq: Once | INTRAMUSCULAR | Status: AC
  Administered 2014-12-21: 3.25 mg via SUBCUTANEOUS
  Filled 2014-12-21: qty 3.25

## 2014-12-21 MED ORDER — ONDANSETRON HCL 4 MG PO TABS
8.0000 mg | ORAL_TABLET | Freq: Once | ORAL | Status: AC
  Administered 2014-12-21: 8 mg via ORAL
  Filled 2014-12-21: qty 2

## 2014-12-21 MED ORDER — REVLIMID 25 MG PO CAPS: 25.0000 mg | ORAL_CAPSULE | Freq: Every day | ORAL | Status: DC

## 2014-12-21 MED ORDER — ZOLEDRONIC ACID 4 MG/5ML IV CONC
4.0000 mg | Freq: Once | INTRAVENOUS | Status: AC
  Administered 2014-12-21: 4 mg via INTRAVENOUS
  Filled 2014-12-21: qty 5

## 2014-12-21 MED ORDER — SODIUM CHLORIDE 0.9 % IV SOLN
Freq: Once | INTRAVENOUS | Status: AC
  Administered 2014-12-21: 12:00:00 via INTRAVENOUS

## 2014-12-21 NOTE — Progress Notes (Signed)
Kelli Hurley presents today for injection per MD orders. Velcade administered SQ in right Abdomen. Administration without incident. Patient tolerated well.  Patient also given Zometa today.  Tolerated infusion well.  VSS post infusion.

## 2014-12-21 NOTE — Patient Instructions (Signed)
[facility] Discharge Instructions for Patients Receiving Chemotherapy  Today you received the following chemotherapy agent: Velcade Zometa as well today.   Please return as scheduled, see appointment list for return date and time.     If you develop nausea and vomiting, or diarrhea that is not controlled by your medication, call the clinic.  The clinic phone number is [phone]. Office hours are Monday-Friday 8:30am-5:00pm.  BELOW ARE SYMPTOMS THAT SHOULD BE REPORTED IMMEDIATELY:  *FEVER GREATER THAN 101.0 F  *CHILLS WITH OR WITHOUT FEVER  NAUSEA AND VOMITING THAT IS NOT CONTROLLED WITH YOUR NAUSEA MEDICATION  *UNUSUAL SHORTNESS OF BREATH  *UNUSUAL BRUISING OR BLEEDING  TENDERNESS IN MOUTH AND THROAT WITH OR WITHOUT PRESENCE OF ULCERS  *URINARY PROBLEMS  *BOWEL PROBLEMS  UNUSUAL RASH Items with * indicate a potential emergency and should be followed up as soon as possible. If you have an emergency after office hours please contact your primary care physician or go to the nearest emergency department.  Please call the clinic during office hours if you have any questions or concerns.   You may also contact the Patient Navigator at [phone] should you have any questions or need assistance in obtaining follow up care.

## 2014-12-21 NOTE — Progress Notes (Signed)
Reviewed labs from 12/14/14 with Meriel Flavors. Verbal OK to order zometa and velcade today based on labs from 12/14/14. Labs are being drawn today as ordered.

## 2014-12-22 ENCOUNTER — Other Ambulatory Visit (HOSPITAL_COMMUNITY): Payer: Self-pay | Admitting: Hematology & Oncology

## 2014-12-22 NOTE — Progress Notes (Signed)
Labs drawn

## 2014-12-24 DIAGNOSIS — J449 Chronic obstructive pulmonary disease, unspecified: Secondary | ICD-10-CM | POA: Diagnosis not present

## 2014-12-24 DIAGNOSIS — I10 Essential (primary) hypertension: Secondary | ICD-10-CM | POA: Diagnosis not present

## 2014-12-24 DIAGNOSIS — C9000 Multiple myeloma not having achieved remission: Secondary | ICD-10-CM | POA: Diagnosis not present

## 2014-12-24 DIAGNOSIS — J9611 Chronic respiratory failure with hypoxia: Secondary | ICD-10-CM | POA: Diagnosis not present

## 2014-12-28 ENCOUNTER — Encounter (HOSPITAL_COMMUNITY): Payer: Self-pay

## 2014-12-28 ENCOUNTER — Encounter (HOSPITAL_BASED_OUTPATIENT_CLINIC_OR_DEPARTMENT_OTHER): Payer: Medicare Other

## 2014-12-28 VITALS — BP 118/63 | HR 84 | Temp 97.7°F | Resp 18 | Wt 334.8 lb

## 2014-12-28 DIAGNOSIS — C9000 Multiple myeloma not having achieved remission: Secondary | ICD-10-CM

## 2014-12-28 DIAGNOSIS — D72819 Decreased white blood cell count, unspecified: Secondary | ICD-10-CM | POA: Diagnosis not present

## 2014-12-28 DIAGNOSIS — Z5112 Encounter for antineoplastic immunotherapy: Secondary | ICD-10-CM

## 2014-12-28 DIAGNOSIS — D509 Iron deficiency anemia, unspecified: Secondary | ICD-10-CM | POA: Diagnosis not present

## 2014-12-28 LAB — COMPREHENSIVE METABOLIC PANEL
ALT: 19 U/L (ref 14–54)
ANION GAP: 13 (ref 5–15)
AST: 21 U/L (ref 15–41)
Albumin: 3.6 g/dL (ref 3.5–5.0)
Alkaline Phosphatase: 46 U/L (ref 38–126)
BUN: 13 mg/dL (ref 6–20)
CALCIUM: 9.5 mg/dL (ref 8.9–10.3)
CHLORIDE: 98 mmol/L — AB (ref 101–111)
CO2: 28 mmol/L (ref 22–32)
CREATININE: 0.89 mg/dL (ref 0.44–1.00)
GLUCOSE: 114 mg/dL — AB (ref 65–99)
POTASSIUM: 3.3 mmol/L — AB (ref 3.5–5.1)
SODIUM: 139 mmol/L (ref 135–145)
Total Bilirubin: 0.7 mg/dL (ref 0.3–1.2)
Total Protein: 7 g/dL (ref 6.5–8.1)

## 2014-12-28 LAB — CBC WITH DIFFERENTIAL/PLATELET
BASOS PCT: 0 %
Basophils Absolute: 0 10*3/uL (ref 0.0–0.1)
EOS ABS: 0.2 10*3/uL (ref 0.0–0.7)
Eosinophils Relative: 5 %
HEMATOCRIT: 33.1 % — AB (ref 36.0–46.0)
HEMOGLOBIN: 10.2 g/dL — AB (ref 12.0–15.0)
LYMPHS ABS: 0.9 10*3/uL (ref 0.7–4.0)
Lymphocytes Relative: 19 %
MCH: 25.4 pg — AB (ref 26.0–34.0)
MCHC: 30.8 g/dL (ref 30.0–36.0)
MCV: 82.5 fL (ref 78.0–100.0)
MONO ABS: 0.4 10*3/uL (ref 0.1–1.0)
MONOS PCT: 7 %
NEUTROS ABS: 3.2 10*3/uL (ref 1.7–7.7)
NEUTROS PCT: 69 %
Platelets: 189 10*3/uL (ref 150–400)
RBC: 4.01 MIL/uL (ref 3.87–5.11)
RDW: 18.8 % — AB (ref 11.5–15.5)
WBC: 4.7 10*3/uL (ref 4.0–10.5)

## 2014-12-28 MED ORDER — ONDANSETRON HCL 4 MG PO TABS
ORAL_TABLET | ORAL | Status: AC
  Filled 2014-12-28: qty 2

## 2014-12-28 MED ORDER — BORTEZOMIB CHEMO SQ INJECTION 3.5 MG (2.5MG/ML)
1.3000 mg/m2 | Freq: Once | INTRAMUSCULAR | Status: AC
  Administered 2014-12-28: 3.25 mg via SUBCUTANEOUS
  Filled 2014-12-28: qty 3.25

## 2014-12-28 MED ORDER — ONDANSETRON HCL 4 MG PO TABS
8.0000 mg | ORAL_TABLET | Freq: Once | ORAL | Status: AC
  Administered 2014-12-28: 8 mg via ORAL

## 2014-12-28 NOTE — Progress Notes (Signed)
LABS DRAWN

## 2014-12-28 NOTE — Progress Notes (Signed)
..  Kelli Hurley presents today for injection per the provider's orders.  velcade administration without incident; see MAR for injection details.  Patient tolerated procedure well and without incident.  No questions or complaints noted at this time. 

## 2014-12-28 NOTE — Patient Instructions (Signed)
..  [facility] Discharge Instructions for Patients Receiving Chemotherapy  Today you received the following chemotherapy agents velcade velcade today Start Revlimid on Dec 5th so that you are starting this on day 1 of each cycle To help prevent nausea and vomiting after your treatment, we encourage you to take your nausea medication  Begin taking it as directed and take it as often as prescribedIf you develop nausea and vomiting, or diarrhea that is not controlled by your medication, call the clinic.  The clinic phone number is [phone]. Office hours are Monday-Friday 8:30am-5:00pm.  BELOW ARE SYMPTOMS THAT SHOULD BE REPORTED IMMEDIATELY:  *FEVER GREATER THAN 101.0 F  *CHILLS WITH OR WITHOUT FEVER  NAUSEA AND VOMITING THAT IS NOT CONTROLLED WITH YOUR NAUSEA MEDICATION  *UNUSUAL SHORTNESS OF BREATH  *UNUSUAL BRUISING OR BLEEDING  TENDERNESS IN MOUTH AND THROAT WITH OR WITHOUT PRESENCE OF ULCERS  *URINARY PROBLEMS  *BOWEL PROBLEMS  UNUSUAL RASH Items with * indicate a potential emergency and should be followed up as soon as possible. If you have an emergency after office hours please contact your primary care physician or go to the nearest emergency department.  Please call the clinic during office hours if you have any questions or concerns.   You may also contact the Patient Navigator at [phone] should you have any questions or need assistance in obtaining follow up care.

## 2014-12-29 ENCOUNTER — Other Ambulatory Visit (HOSPITAL_COMMUNITY): Payer: Self-pay

## 2015-01-04 ENCOUNTER — Encounter (HOSPITAL_BASED_OUTPATIENT_CLINIC_OR_DEPARTMENT_OTHER): Payer: Medicare Other

## 2015-01-04 VITALS — BP 126/67 | HR 84 | Temp 98.2°F | Resp 22 | Wt 334.4 lb

## 2015-01-04 DIAGNOSIS — C9000 Multiple myeloma not having achieved remission: Secondary | ICD-10-CM

## 2015-01-04 DIAGNOSIS — D72819 Decreased white blood cell count, unspecified: Secondary | ICD-10-CM | POA: Diagnosis not present

## 2015-01-04 DIAGNOSIS — Z5112 Encounter for antineoplastic immunotherapy: Secondary | ICD-10-CM

## 2015-01-04 DIAGNOSIS — D509 Iron deficiency anemia, unspecified: Secondary | ICD-10-CM | POA: Diagnosis not present

## 2015-01-04 LAB — CBC WITH DIFFERENTIAL/PLATELET
Basophils Absolute: 0 10*3/uL (ref 0.0–0.1)
Basophils Relative: 0 %
EOS ABS: 0 10*3/uL (ref 0.0–0.7)
Eosinophils Relative: 1 %
HEMATOCRIT: 36.1 % (ref 36.0–46.0)
HEMOGLOBIN: 11.2 g/dL — AB (ref 12.0–15.0)
Lymphocytes Relative: 16 %
Lymphs Abs: 0.9 10*3/uL (ref 0.7–4.0)
MCH: 25.7 pg — AB (ref 26.0–34.0)
MCHC: 31 g/dL (ref 30.0–36.0)
MCV: 82.8 fL (ref 78.0–100.0)
MONO ABS: 0.2 10*3/uL (ref 0.1–1.0)
MONOS PCT: 4 %
NEUTROS PCT: 79 %
Neutro Abs: 4.2 10*3/uL (ref 1.7–7.7)
Platelets: 272 10*3/uL (ref 150–400)
RBC: 4.36 MIL/uL (ref 3.87–5.11)
RDW: 18.7 % — ABNORMAL HIGH (ref 11.5–15.5)
WBC: 5.3 10*3/uL (ref 4.0–10.5)

## 2015-01-04 LAB — COMPREHENSIVE METABOLIC PANEL
ALK PHOS: 43 U/L (ref 38–126)
ALT: 14 U/L (ref 14–54)
ANION GAP: 6 (ref 5–15)
AST: 15 U/L (ref 15–41)
Albumin: 4 g/dL (ref 3.5–5.0)
BILIRUBIN TOTAL: 0.7 mg/dL (ref 0.3–1.2)
BUN: 18 mg/dL (ref 6–20)
CALCIUM: 9.5 mg/dL (ref 8.9–10.3)
CO2: 27 mmol/L (ref 22–32)
Chloride: 106 mmol/L (ref 101–111)
Creatinine, Ser: 0.87 mg/dL (ref 0.44–1.00)
Glucose, Bld: 83 mg/dL (ref 65–99)
Potassium: 3.8 mmol/L (ref 3.5–5.1)
Sodium: 139 mmol/L (ref 135–145)
TOTAL PROTEIN: 7.8 g/dL (ref 6.5–8.1)

## 2015-01-04 MED ORDER — BORTEZOMIB CHEMO SQ INJECTION 3.5 MG (2.5MG/ML)
1.3000 mg/m2 | Freq: Once | INTRAMUSCULAR | Status: AC
  Administered 2015-01-04: 3.25 mg via SUBCUTANEOUS
  Filled 2015-01-04: qty 3.25

## 2015-01-04 MED ORDER — ONDANSETRON HCL 4 MG PO TABS
8.0000 mg | ORAL_TABLET | Freq: Once | ORAL | Status: AC
  Administered 2015-01-04: 8 mg via ORAL
  Filled 2015-01-04: qty 2

## 2015-01-04 NOTE — Patient Instructions (Signed)
[facility] Discharge Instructions for Patients Receiving Chemotherapy  Today you received the following chemotherapy agents Velcade injection. If you develop nausea and vomiting that is not controlled by your nausea medication, call the clinic. If it is after clinic hours your family physician or the after hours number for the clinic or go to the Emergency Department. BELOW ARE SYMPTOMS THAT SHOULD BE REPORTED IMMEDIATELY:  *FEVER GREATER THAN 101.0 F  *CHILLS WITH OR WITHOUT FEVER  NAUSEA AND VOMITING THAT IS NOT CONTROLLED WITH YOUR NAUSEA MEDICATION  *UNUSUAL SHORTNESS OF BREATH  *UNUSUAL BRUISING OR BLEEDING  TENDERNESS IN MOUTH AND THROAT WITH OR WITHOUT PRESENCE OF ULCERS  *URINARY PROBLEMS  *BOWEL PROBLEMS  UNUSUAL RASH Items with * indicate a potential emergency and should be followed up as soon as possible.  Return as scheduled.  I have been informed and understand all the instructions given to me. I know to contact the clinic, my physician, or go to the Emergency Department if any problems should occur. I do not have any questions at this time, but understand that I may call the clinic during office hours or the Patient Navigator at [phone] should I have any questions or need assistance in obtaining follow up care.    __________________________________________  _____________  __________ Signature of Patient or Authorized Representative            Date                   Time    __________________________________________ Nurse's Signature

## 2015-01-04 NOTE — Progress Notes (Signed)
..  Kelli Hurley's reason for visit today is for labs as scheduled per MD orders.  Venipuncture performed with a 23 gauge butterfly needle to R Antecubital.  Kelli Filler Bunker tolerated procedure well and without incident; questions were answered and patient was discharged.

## 2015-01-06 ENCOUNTER — Other Ambulatory Visit (HOSPITAL_COMMUNITY): Payer: Self-pay | Admitting: Hematology & Oncology

## 2015-01-11 ENCOUNTER — Other Ambulatory Visit (HOSPITAL_COMMUNITY): Payer: Self-pay | Admitting: Oncology

## 2015-01-11 ENCOUNTER — Encounter (HOSPITAL_COMMUNITY): Payer: Self-pay | Admitting: Hematology & Oncology

## 2015-01-11 ENCOUNTER — Encounter (HOSPITAL_COMMUNITY): Payer: Medicare Other | Attending: Oncology | Admitting: Hematology & Oncology

## 2015-01-11 ENCOUNTER — Encounter (HOSPITAL_BASED_OUTPATIENT_CLINIC_OR_DEPARTMENT_OTHER): Payer: Medicare Other

## 2015-01-11 VITALS — BP 133/69 | HR 85 | Temp 98.0°F | Resp 22 | Wt 337.0 lb

## 2015-01-11 DIAGNOSIS — E669 Obesity, unspecified: Secondary | ICD-10-CM

## 2015-01-11 DIAGNOSIS — C9000 Multiple myeloma not having achieved remission: Secondary | ICD-10-CM | POA: Diagnosis not present

## 2015-01-11 DIAGNOSIS — R0602 Shortness of breath: Secondary | ICD-10-CM | POA: Diagnosis not present

## 2015-01-11 DIAGNOSIS — D649 Anemia, unspecified: Secondary | ICD-10-CM

## 2015-01-11 DIAGNOSIS — D72819 Decreased white blood cell count, unspecified: Secondary | ICD-10-CM | POA: Insufficient documentation

## 2015-01-11 DIAGNOSIS — R05 Cough: Secondary | ICD-10-CM | POA: Diagnosis not present

## 2015-01-11 DIAGNOSIS — Z79899 Other long term (current) drug therapy: Secondary | ICD-10-CM | POA: Diagnosis not present

## 2015-01-11 DIAGNOSIS — I10 Essential (primary) hypertension: Secondary | ICD-10-CM | POA: Insufficient documentation

## 2015-01-11 DIAGNOSIS — C9001 Multiple myeloma in remission: Secondary | ICD-10-CM

## 2015-01-11 DIAGNOSIS — D509 Iron deficiency anemia, unspecified: Secondary | ICD-10-CM | POA: Diagnosis not present

## 2015-01-11 DIAGNOSIS — Z5112 Encounter for antineoplastic immunotherapy: Secondary | ICD-10-CM | POA: Diagnosis present

## 2015-01-11 LAB — COMPREHENSIVE METABOLIC PANEL
ALBUMIN: 3.9 g/dL (ref 3.5–5.0)
ALK PHOS: 42 U/L (ref 38–126)
ALT: 11 U/L — AB (ref 14–54)
AST: 15 U/L (ref 15–41)
Anion gap: 5 (ref 5–15)
BILIRUBIN TOTAL: 0.7 mg/dL (ref 0.3–1.2)
BUN: 18 mg/dL (ref 6–20)
CALCIUM: 9.2 mg/dL (ref 8.9–10.3)
CO2: 30 mmol/L (ref 22–32)
CREATININE: 0.88 mg/dL (ref 0.44–1.00)
Chloride: 105 mmol/L (ref 101–111)
GFR calc Af Amer: >60 mL/min (ref >60)
GFR calc non Af Amer: >60 mL/min (ref >60)
GLUCOSE: 98 mg/dL (ref 65–99)
Potassium: 3.4 mmol/L — ABNORMAL LOW (ref 3.5–5.1)
Sodium: 140 mmol/L (ref 135–145)
TOTAL PROTEIN: 7.2 g/dL (ref 6.5–8.1)

## 2015-01-11 LAB — CBC WITH DIFFERENTIAL/PLATELET
BASOS ABS: 0 10*3/uL (ref 0.0–0.1)
BASOS PCT: 0 %
Eosinophils Absolute: 0.1 10*3/uL (ref 0.0–0.7)
Eosinophils Relative: 2 %
HEMATOCRIT: 34 % — AB (ref 36.0–46.0)
HEMOGLOBIN: 10.4 g/dL — AB (ref 12.0–15.0)
Lymphocytes Relative: 23 %
Lymphs Abs: 1.1 10*3/uL (ref 0.7–4.0)
MCH: 25.3 pg — ABNORMAL LOW (ref 26.0–34.0)
MCHC: 30.6 g/dL (ref 30.0–36.0)
MCV: 82.7 fL (ref 78.0–100.0)
MONOS PCT: 9 %
Monocytes Absolute: 0.4 10*3/uL (ref 0.1–1.0)
NEUTROS ABS: 3.2 10*3/uL (ref 1.7–7.7)
NEUTROS PCT: 66 %
Platelets: 250 10*3/uL (ref 150–400)
RBC: 4.11 MIL/uL (ref 3.87–5.11)
RDW: 18.4 % — ABNORMAL HIGH (ref 11.5–15.5)
WBC: 4.9 10*3/uL (ref 4.0–10.5)

## 2015-01-11 LAB — LACTATE DEHYDROGENASE: LDH: 185 U/L (ref 98–192)

## 2015-01-11 LAB — C-REACTIVE PROTEIN: CRP: 0.9 mg/dL (ref <1.0)

## 2015-01-11 LAB — SEDIMENTATION RATE: Sed Rate: 46 mm/hr — ABNORMAL HIGH (ref 0–22)

## 2015-01-11 MED ORDER — BORTEZOMIB CHEMO SQ INJECTION 3.5 MG (2.5MG/ML)
1.3000 mg/m2 | Freq: Once | INTRAMUSCULAR | Status: AC
  Administered 2015-01-11: 3.25 mg via SUBCUTANEOUS
  Filled 2015-01-11: qty 3.25

## 2015-01-11 MED ORDER — REVLIMID 25 MG PO CAPS: 25.0000 mg | ORAL_CAPSULE | Freq: Every day | ORAL | Status: DC

## 2015-01-11 MED ORDER — ONDANSETRON HCL 4 MG PO TABS
8.0000 mg | ORAL_TABLET | Freq: Once | ORAL | Status: AC
  Administered 2015-01-11: 8 mg via ORAL
  Filled 2015-01-11: qty 2

## 2015-01-11 NOTE — Patient Instructions (Addendum)
[place] at [facility] Discharge Instructions  RECOMMENDATIONS MADE BY THE CONSULTANT AND ANY TEST RESULTS WILL BE SENT TO YOUR REFERRING PHYSICIAN.   Exam completed by Dr Whitney Muse today Velcade today Velcade as scheduled Referral sent to Dr Ivin Booty at [facility], they will call you with the appt zometa every 4 weeks Continue taking your revlimid as prescribed Return to see the doctor at day 1 of velcade which is December 27 Please call the clinic if you have any questions or concerns  Thank you for choosing [place] at [facility] to provide your oncology and hematology care.  To afford each patient quality time with our provider, please arrive at least 15 minutes before your scheduled appointment time.    You need to re-schedule your appointment should you arrive 10 or more minutes late.  We strive to give you quality time with our providers, and arriving late affects you and other patients whose appointments are after yours.  Also, if you no show three or more times for appointments you may be dismissed from the clinic at the providers discretion.     Again, thank you for choosing [facility].  Our hope is that these requests will decrease the amount of time that you wait before being seen by our physicians.       _____________________________________________________________  Should you have questions after your visit to [facility], please contact our office at [phone] between the hours of 8:30 a.m. and 4:30 p.m.  Voicemails left after 4:30 p.m. will not be returned until the following business day.  For prescription refill requests, have your pharmacy contact our office.

## 2015-01-11 NOTE — Progress Notes (Signed)
[facility] Hematology/Oncology Progress Note   Name: Kelli Hurley      MRN: [id number]    Date: 01/11/2015 Time:11:23 AM   REFERRING PHYSICIAN:  Dr. Charlotte Sanes  REASON FOR CONSULT:  Anemia, leukopenia, and cryglobulinemia   DIAGNOSIS:  Multiple Myeloma IgG kappa, STAGE II, serum albumin 3.5 g/dl  HISTORY OF PRESENT ILLNESS:   Kelli Hurley is a 59 year old black American woman with a past medical history significant for HTN and obesity who was referred to [facility] for anemia, leukopenia, and cryglobulinemia. She has been diagnosed with multiple myeloma. She is currently on RVD therapy.   Kelli Hurley is accompanied today by her daughter.  She is open to the option of meeting with a myeloma doctor at [facility] regarding ongoing therapy and transplant.  Regarding neuropathy, she denies any issues with her fingers and toes. She does comment that food isn't tasting too good. She denies having any problems with the steroids. She denies any worsening of her legs in terms of swelling. Kelli Hurley confirms that her bowels have been okay and that she is not experiencing any constipation  She consulted with Dr. Luan Pulling who said she has asthma.  She has two inhalers she takes for this, one every day, and one as-needed.  She also confirmed therapy on Revlamid.  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 She confirms having a good appetite and a good Thanksgiving. Her family is doing all right and she says that for Christmas they are going to eat. She says everybody comes over on Sundays anyway.  Kelli Hurley confirms taking aspirin and acyclovir every day, and that she has recently had the flu shot.   PAST MEDICAL HISTORY:   Past Medical History  Diagnosis Date  . Hypertension   . Anemia   . Normocytic hypochromic anemia 08/03/2014  . Leukopenia 08/03/2014  . Claustrophobia 10/05/2014    ALLERGIES: No Known  Allergies    MEDICATIONS: I have reviewed the patient's current medications.    Current Outpatient Prescriptions on File Prior to Visit  Medication Sig Dispense Refill  . acyclovir (ZOVIRAX) 400 MG tablet Take 400 mg by mouth 2 (two) times daily.     Marland Kitchen albuterol (PROVENTIL HFA;VENTOLIN HFA) 108 (90 BASE) MCG/ACT inhaler Inhale 1 puff into the lungs every 6 (six) hours as needed for wheezing or shortness of breath.    Marland Kitchen aspirin 325 MG tablet Take 325 mg by mouth daily.    . Bortezomib (VELCADE IJ) Inject as directed. Days 1, 8, 15 every 21 days. To start approximately 10/02/14    . calcium-vitamin D (OSCAL WITH D) 500-200 MG-UNIT tablet Take 2 tablets by mouth daily with breakfast. 60 tablet 5  . dexamethasone (DECADRON) 4 MG tablet TAKE 10 TABLETS BY MOUTH ONCE DAILY ON DAYS 1, 8 AND 15 OF CHEMO. REPEAT EVERY 21 DAYS. 30 tablet 2  . hydrochlorothiazide (HYDRODIURIL) 25 MG tablet Take 25 mg by mouth every morning.    . ondansetron (ZOFRAN) 8 MG tablet Take 8 mg by mouth every 8 (eight) hours as needed for nausea or vomiting.     . potassium chloride SA (K-DUR,KLOR-CON) 20 MEQ tablet Take 1 tablet (20 mEq total) by mouth daily. 30 tablet 3  . prochlorperazine (COMPAZINE) 10 MG tablet Take 10 mg by mouth every 6 (six) hours as needed for nausea or vomiting.     Marland Kitchen REVLIMID 25 MG capsule Take  1 capsule (25 mg total) by mouth daily. Take 14 days on and 7 days off 28 capsule 1  . vitamin C (ASCORBIC ACID) 500 MG tablet Take 500 mg by mouth daily.    Marland Kitchen ALPRAZolam (XANAX) 0.5 MG tablet Take 2 tablets 1 hour prior to PET scan. Then take 1 tab in waiting room if needed. (Patient not taking: Reported on 11/16/2014) 5 tablet 0  . diazepam (VALIUM) 5 MG tablet Take 1 tab 1 hour prior to procedure. Then take 1-2 tablets when you arrive if needed. (Patient not taking: Reported on 01/11/2015) 5 tablet 0   Current Facility-Administered Medications on File Prior to Visit  Medication Dose Route Frequency Provider  Last Rate Last Dose  . heparin lock flush 100 unit/mL  500 Units Intravenous Once Patrici Ranks, MD      . sodium chloride 0.9 % injection 10 mL  10 mL Intravenous Once Patrici Ranks, MD         PAST SURGICAL HISTORY Past Surgical History  Procedure Laterality Date  . Abdominal hysterectomy    . Other surgical history      heart surgery as infant to "repair hole in heart"    FAMILY HISTORY: Family History  Problem Relation Age of Onset  . Cancer Mother   . Hypertension Mother   . Cancer Father   . Hypertension Father   . Cancer Maternal Grandmother     SOCIAL HISTORY: She reports that she used to smoke as a teenager, but quit > 40 years ago.  She denies EtOH abuse.  She denies illicit drug abuse.  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  She live byherself.  She is divorced with two daughters ages 4 and 31 who are healthy.  Her grandbaby is healthy too.  She is Psychologist, forensic.  PERFORMANCE STATUS: The patient's performance status is 1 - Symptomatic but completely ambulatory  ROS: Denies fever, appetite loss. 14 point review of systems was performed and is negative except as detailed under history of present illness and above  PHYSICAL EXAM: Most Recent Vital Signs: Blood pressure 133/69, pulse 85, temperature 98 F (36.7 C), temperature source Oral, resp. rate 22, weight 337 lb (152.862 kg), SpO2 97 %. General appearance: alert, cooperative, appears stated age, no distress and morbidly obese. Assisted onto exam table. Head: Normocephalic, without obvious abnormality, atraumatic Eyes: conjunctivae/corneas clear. PERRL, EOM's intact. Fundi benign. Throat: lips, mucosa, and tongue normal; teeth and gums normal Neck: no adenopathy, supple, symmetrical, trachea midline and thyroid not enlarged, symmetric, no tenderness/mass/nodules Back: symmetric, no curvature. ROM normal. No CVA tenderness. Lungs: clear to  auscultation bilaterally and normal percussion bilaterally Heart: regular rate and rhythm, S1, S2 normal, no murmur, click, rub or gallop Abdomen: soft, non-tender; bowel sounds normal; no masses,  no organomegaly Extremities: extremities normal, atraumatic, no cyanosis edema at the ankles bilaterally. Right lower extremity greater than left lower extremity Skin: Skin color, texture, turgor normal. No rashes or lesions Lymph nodes: Cervical, supraclavicular, and axillary nodes normal. Neurologic: Alert and oriented X 3, normal strength and tone. Normal symmetric reflexes. Normal coordination and gait  LABORATORY DATA:  I have reviewed the data as listed.  Bone Marrow BiopsyS Diagnosis Bone Marrow, Aspirate,Biopsy, and Clot - NORMOCELLULAR BONE MARROW FOR AGE WITH PLASMA CELL NEOPLASM. - TRILINEAGE HEMATOPOIESIS. - SEE COMMENT. PERIPHERAL BLOOD: - NORMOCYTIC-NORMOCHROMIC ANEMIA - LEUKOPENIA Diagnosis Note Despite limited aspirate material, the  plasma cell component is increased in the marrow representing an estimated 18% of all cells with associated atypical cytomorphologic features. Immunohistochemical stains show the the plasma cells are kappa light chain-restricted consistent with plasma cell neoplasm. The background shows trilineage hematopoiesis with non specific changes. Correlation with cytogenetic and FISH studies is recommended. (BNS:ds/gt, 09/04/14) Susanne Greenhouse MD Pathologist, Electronic Signature (Case signed 09/07/2014)   RADIOGRAPHY: I have reviewed the images below and agree with the results. CLINICAL DATA: Normocytic hypochromic anemia. Leukopenia.  EXAM: METASTATIC BONE SURVEY  COMPARISON: None.  FINDINGS: Multilevel degenerative disc disease is noted in the cervical and thoracic spine. Severe degenerative disc disease is noted in both knees. No areas of abnormal sclerosis or lytic destruction are seen in the visualized skeleton.  IMPRESSION: No  lytic lesions are noted in the visualized skeleton.   Electronically Signed  By: Marijo Conception, M.D.  On: 08/07/2014 12:33  PATHOLOGY:  None   ASSESSMENT/PLAN:  Multiple Myeloma IgG kappa, STAGE II, serum albumin 3.5 g/dl IgG of 04/14/2007 MG/DL, total M spike of 2.7 g/DL Beta-2 microglobulin of 3.7 MG/L Severe Anemia BMBX with 18% monoclonal plasma cells, kappa light chain restsricted. Cytogenetics with 13q-, 17p- (high risk disease) ESR 138 mm/hr Urine with kappa/lambda ratio 5.11, IgG monoclonal protein with kappa light chain specificity by immunofixation only Myeloma survey on 08/07/2014 with no lytic lesions noted PET/CT on 09/30/2014 with no abnormal hypermetabolism in the neck chest abdomen or pelvis RVD started on 10/05/2014   She has a formal diagnosis of multiple myeloma. Based upon staging, has stage II disease. Based upon cytogenetics has high risk disease.  She is currently on RVD. We dose reduced her Velcade because of lower extremity edema that was significant enough she went to the emergency department.   She is doing well. She is working with Dr. Luan Pulling in regards to her exertional hypoxemia. She has been diagnosed with asthma. She is doing much better.  I have discussed with the patient that while on zometa she cannot have any major dental work done. We discussed the risks and benefits of Zometa therapy, I explained to the patient that even though we do not have evidence of lytic disease that Zometa is indicated in all patients with multiple myeloma. She does receive dental care. We discussed the risk of osteonecrosis of the jaw with certain dental procedures.  I am going to refer her to [facility] for consideration/evaluation for transplantation. I will see her back prior to her next cycle with labs.  All questions were answered. The patient knows to call the clinic with any problems, questions or concerns. We can certainly see the patient much sooner if  necessary.   This document serves as a record of services personally performed by Ancil Linsey, MD. It was created on her behalf by Toni Amend, a trained medical scribe. The creation of this record is based on the scribe's personal observations and the provider's statements to them. This document has been checked and approved by the attending provider.  I have reviewed the above documentation for accuracy and completeness, and I agree with the above.  This note is electronically signed.  Kelby Fam. Whitney Muse, MD

## 2015-01-11 NOTE — Progress Notes (Signed)
Labs drawn

## 2015-01-12 LAB — KAPPA/LAMBDA LIGHT CHAINS
Kappa free light chain: 21.84 mg/L — ABNORMAL HIGH (ref 3.30–19.40)
Kappa, lambda light chain ratio: 1.41 (ref 0.26–1.65)
Lambda free light chains: 15.47 mg/L (ref 5.71–26.30)

## 2015-01-12 LAB — BETA 2 MICROGLOBULIN, SERUM: BETA 2 MICROGLOBULIN: 3.1 mg/L — AB (ref 0.6–2.4)

## 2015-01-13 ENCOUNTER — Other Ambulatory Visit (HOSPITAL_COMMUNITY): Payer: Self-pay | Admitting: Oncology

## 2015-01-13 ENCOUNTER — Other Ambulatory Visit (HOSPITAL_COMMUNITY): Payer: Self-pay | Admitting: Emergency Medicine

## 2015-01-13 DIAGNOSIS — C9001 Multiple myeloma in remission: Secondary | ICD-10-CM

## 2015-01-13 LAB — MULTIPLE MYELOMA PANEL, SERUM
ALBUMIN SERPL ELPH-MCNC: 3.3 g/dL (ref 2.9–4.4)
ALPHA 1: 0.2 g/dL (ref 0.0–0.4)
Albumin/Glob SerPl: 1.2 (ref 0.7–1.7)
Alpha2 Glob SerPl Elph-Mcnc: 0.6 g/dL (ref 0.4–1.0)
B-Globulin SerPl Elph-Mcnc: 0.9 g/dL (ref 0.7–1.3)
Gamma Glob SerPl Elph-Mcnc: 1.1 g/dL (ref 0.4–1.8)
Globulin, Total: 2.8 g/dL (ref 2.2–3.9)
IGA: 57 mg/dL — AB (ref 87–352)
IGM, SERUM: 99 mg/dL (ref 26–217)
IgG (Immunoglobin G), Serum: 1114 mg/dL (ref 700–1600)
M Protein SerPl Elph-Mcnc: 0.5 g/dL — ABNORMAL HIGH
Total Protein ELP: 6.1 g/dL (ref 6.0–8.5)

## 2015-01-13 NOTE — Progress Notes (Signed)
Left a message for the pt letting her know that her labs continue to improve

## 2015-01-18 ENCOUNTER — Other Ambulatory Visit (HOSPITAL_COMMUNITY): Payer: Self-pay | Admitting: Oncology

## 2015-01-18 ENCOUNTER — Encounter (HOSPITAL_BASED_OUTPATIENT_CLINIC_OR_DEPARTMENT_OTHER): Payer: Medicare Other | Admitting: Oncology

## 2015-01-18 ENCOUNTER — Encounter (HOSPITAL_BASED_OUTPATIENT_CLINIC_OR_DEPARTMENT_OTHER): Payer: Medicare Other

## 2015-01-18 ENCOUNTER — Encounter (HOSPITAL_COMMUNITY): Payer: Medicare Other

## 2015-01-18 DIAGNOSIS — Z79899 Other long term (current) drug therapy: Secondary | ICD-10-CM | POA: Diagnosis not present

## 2015-01-18 DIAGNOSIS — R0602 Shortness of breath: Secondary | ICD-10-CM | POA: Diagnosis not present

## 2015-01-18 DIAGNOSIS — Z5112 Encounter for antineoplastic immunotherapy: Secondary | ICD-10-CM | POA: Diagnosis present

## 2015-01-18 DIAGNOSIS — R062 Wheezing: Secondary | ICD-10-CM

## 2015-01-18 DIAGNOSIS — D72819 Decreased white blood cell count, unspecified: Secondary | ICD-10-CM | POA: Diagnosis not present

## 2015-01-18 DIAGNOSIS — J069 Acute upper respiratory infection, unspecified: Secondary | ICD-10-CM

## 2015-01-18 DIAGNOSIS — C9000 Multiple myeloma not having achieved remission: Secondary | ICD-10-CM | POA: Diagnosis present

## 2015-01-18 DIAGNOSIS — I10 Essential (primary) hypertension: Secondary | ICD-10-CM | POA: Diagnosis not present

## 2015-01-18 DIAGNOSIS — D509 Iron deficiency anemia, unspecified: Secondary | ICD-10-CM | POA: Diagnosis not present

## 2015-01-18 LAB — CBC WITH DIFFERENTIAL/PLATELET
BASOS ABS: 0 10*3/uL (ref 0.0–0.1)
BASOS PCT: 0 %
EOS ABS: 0.1 10*3/uL (ref 0.0–0.7)
Eosinophils Relative: 2 %
HCT: 35 % — ABNORMAL LOW (ref 36.0–46.0)
HEMOGLOBIN: 10.9 g/dL — AB (ref 12.0–15.0)
Lymphocytes Relative: 11 %
Lymphs Abs: 0.8 10*3/uL (ref 0.7–4.0)
MCH: 25.8 pg — ABNORMAL LOW (ref 26.0–34.0)
MCHC: 31.1 g/dL (ref 30.0–36.0)
MCV: 82.9 fL (ref 78.0–100.0)
MONO ABS: 0.2 10*3/uL (ref 0.1–1.0)
MONOS PCT: 3 %
NEUTROS ABS: 5.7 10*3/uL (ref 1.7–7.7)
NEUTROS PCT: 83 %
Platelets: 178 10*3/uL (ref 150–400)
RBC: 4.22 MIL/uL (ref 3.87–5.11)
RDW: 17.8 % — AB (ref 11.5–15.5)
WBC: 6.9 10*3/uL (ref 4.0–10.5)

## 2015-01-18 LAB — COMPREHENSIVE METABOLIC PANEL
ALBUMIN: 3.6 g/dL (ref 3.5–5.0)
ALT: 16 U/L (ref 14–54)
ANION GAP: 7 (ref 5–15)
AST: 30 U/L (ref 15–41)
Alkaline Phosphatase: 41 U/L (ref 38–126)
BUN: 14 mg/dL (ref 6–20)
CHLORIDE: 101 mmol/L (ref 101–111)
CO2: 30 mmol/L (ref 22–32)
Calcium: 9.1 mg/dL (ref 8.9–10.3)
Creatinine, Ser: 0.9 mg/dL (ref 0.44–1.00)
GFR calc Af Amer: >60 mL/min (ref >60)
GFR calc non Af Amer: >60 mL/min (ref >60)
GLUCOSE: 114 mg/dL — AB (ref 65–99)
POTASSIUM: 3.6 mmol/L (ref 3.5–5.1)
SODIUM: 138 mmol/L (ref 135–145)
Total Bilirubin: 0.5 mg/dL (ref 0.3–1.2)
Total Protein: 7.2 g/dL (ref 6.5–8.1)

## 2015-01-18 MED ORDER — BENZONATATE 200 MG PO CAPS: 200.0000 mg | ORAL_CAPSULE | Freq: Three times a day (TID) | ORAL | Status: DC | PRN

## 2015-01-18 MED ORDER — BORTEZOMIB CHEMO SQ INJECTION 3.5 MG (2.5MG/ML)
1.3000 mg/m2 | Freq: Once | INTRAMUSCULAR | Status: AC
  Administered 2015-01-18: 3.25 mg via SUBCUTANEOUS
  Filled 2015-01-18: qty 3.25

## 2015-01-18 MED ORDER — IPRATROPIUM-ALBUTEROL 0.5-2.5 (3) MG/3ML IN SOLN: 3.0000 mL | RESPIRATORY_TRACT | Status: DC

## 2015-01-18 MED ORDER — ONDANSETRON HCL 4 MG PO TABS
ORAL_TABLET | ORAL | Status: AC
  Filled 2015-01-18: qty 2

## 2015-01-18 MED ORDER — ZOLEDRONIC ACID 4 MG/5ML IV CONC
4.0000 mg | Freq: Once | INTRAVENOUS | Status: AC
  Administered 2015-01-18: 4 mg via INTRAVENOUS
  Filled 2015-01-18: qty 5

## 2015-01-18 MED ORDER — IPRATROPIUM-ALBUTEROL 0.5-2.5 (3) MG/3ML IN SOLN
RESPIRATORY_TRACT | Status: AC
  Filled 2015-01-18: qty 3

## 2015-01-18 MED ORDER — SODIUM CHLORIDE 0.9 % IV SOLN
Freq: Once | INTRAVENOUS | Status: AC
  Administered 2015-01-18: 11:00:00 via INTRAVENOUS

## 2015-01-18 MED ORDER — AMOXICILLIN-POT CLAVULANATE 875-125 MG PO TABS: 1.0000 | ORAL_TABLET | Freq: Two times a day (BID) | ORAL | Status: DC

## 2015-01-18 MED ORDER — ONDANSETRON HCL 4 MG PO TABS
8.0000 mg | ORAL_TABLET | Freq: Once | ORAL | Status: AC
  Administered 2015-01-18: 8 mg via ORAL

## 2015-01-18 NOTE — Progress Notes (Signed)
Labs drawn

## 2015-01-18 NOTE — Patient Instructions (Signed)
[facility] Discharge Instructions for Patients Receiving Chemotherapy  Today you received the following chemotherapy agents Velcade. Today you also received Zometa infusion as ordered. Gershon Mussel is calling in an antibiotic and cough suppressant for you. If you are not better in a few days please let us know.  To help prevent nausea and vomiting after your treatment, we encourage you to take your nausea medication as instructed. If you develop nausea and vomiting that is not controlled by your nausea medication, call the clinic. If it is after clinic hours your family physician or the after hours number for the clinic or go to the Emergency Department. BELOW ARE SYMPTOMS THAT SHOULD BE REPORTED IMMEDIATELY:  *FEVER GREATER THAN 101.0 F  *CHILLS WITH OR WITHOUT FEVER  NAUSEA AND VOMITING THAT IS NOT CONTROLLED WITH YOUR NAUSEA MEDICATION  *UNUSUAL SHORTNESS OF BREATH  *UNUSUAL BRUISING OR BLEEDING  TENDERNESS IN MOUTH AND THROAT WITH OR WITHOUT PRESENCE OF ULCERS  *URINARY PROBLEMS  *BOWEL PROBLEMS  UNUSUAL RASH Items with * indicate a potential emergency and should be followed up as soon as possible.  Return as scheduled.  I have been informed and understand all the instructions given to me. I know to contact the clinic, my physician, or go to the Emergency Department if any problems should occur. I do not have any questions at this time, but understand that I may call the clinic during office hours or the Patient Navigator at [phone] should I have any questions or need assistance in obtaining follow up care.    __________________________________________  _____________  __________ Signature of Patient or Authorized Representative            Date                   Time    __________________________________________ Nurse's Signature

## 2015-01-18 NOTE — Progress Notes (Signed)
Tolerated zometa infusion well.

## 2015-01-18 NOTE — Progress Notes (Signed)
Kelli Hurley is seen as a work-in today.  She is here for treatment and nursing reported a few complaints.  Today in the clinic, she was wheezing and SOB.  O2 sats were stable and WNL.  I ordered a Duoneb.  After the Duoneb, I saw the patient.  She reports coughing up green sputum beginning on Saturday.  She denies any fevers or chills.  She has a mask in place.  She otherwise feels ok.  She admits that the Duoneb was effective for her.  She notes a cough at home that keeps her up at night.  I have prescribed Augmentin BID x 10 days. I have prescribed Tessalon pearles for her as well.  She is to return as scheduled.  Patient and plan discussed with Dr. Ancil Linsey and she is in agreement with the aforementioned.   Robynn Pane, PA-C 01/18/2015 5:29 PM

## 2015-01-20 ENCOUNTER — Other Ambulatory Visit (HOSPITAL_COMMUNITY): Payer: Self-pay | Admitting: Oncology

## 2015-01-20 ENCOUNTER — Encounter (HOSPITAL_COMMUNITY): Payer: Self-pay | Admitting: Oncology

## 2015-01-20 ENCOUNTER — Encounter (HOSPITAL_BASED_OUTPATIENT_CLINIC_OR_DEPARTMENT_OTHER): Payer: Medicare Other | Admitting: Oncology

## 2015-01-20 ENCOUNTER — Telehealth (HOSPITAL_COMMUNITY): Payer: Self-pay | Admitting: *Deleted

## 2015-01-20 ENCOUNTER — Ambulatory Visit (HOSPITAL_COMMUNITY)
Admission: RE | Admit: 2015-01-20 | Discharge: 2015-01-20 | Disposition: A | Discharge status: Home / Self Care | Payer: Medicare Other | Source: Ambulatory Visit | Attending: Oncology | Admitting: Oncology

## 2015-01-20 VITALS — BP 120/64 | HR 86 | Temp 98.7°F | Resp 24 | Wt 332.8 lb

## 2015-01-20 DIAGNOSIS — R0602 Shortness of breath: Secondary | ICD-10-CM | POA: Insufficient documentation

## 2015-01-20 DIAGNOSIS — J441 Chronic obstructive pulmonary disease with (acute) exacerbation: Secondary | ICD-10-CM | POA: Diagnosis not present

## 2015-01-20 DIAGNOSIS — I517 Cardiomegaly: Secondary | ICD-10-CM | POA: Diagnosis not present

## 2015-01-20 DIAGNOSIS — C9000 Multiple myeloma not having achieved remission: Secondary | ICD-10-CM

## 2015-01-20 DIAGNOSIS — I10 Essential (primary) hypertension: Secondary | ICD-10-CM | POA: Diagnosis not present

## 2015-01-20 DIAGNOSIS — R0600 Dyspnea, unspecified: Secondary | ICD-10-CM

## 2015-01-20 DIAGNOSIS — Z79899 Other long term (current) drug therapy: Secondary | ICD-10-CM | POA: Diagnosis not present

## 2015-01-20 DIAGNOSIS — R05 Cough: Secondary | ICD-10-CM | POA: Diagnosis not present

## 2015-01-20 DIAGNOSIS — D72819 Decreased white blood cell count, unspecified: Secondary | ICD-10-CM | POA: Diagnosis not present

## 2015-01-20 DIAGNOSIS — R062 Wheezing: Secondary | ICD-10-CM

## 2015-01-20 DIAGNOSIS — D509 Iron deficiency anemia, unspecified: Secondary | ICD-10-CM | POA: Diagnosis not present

## 2015-01-20 MED ORDER — IPRATROPIUM-ALBUTEROL 0.5-2.5 (3) MG/3ML IN SOLN
3.0000 mL | RESPIRATORY_TRACT | Status: AC
  Administered 2015-01-20: 3 mL via RESPIRATORY_TRACT

## 2015-01-20 MED ORDER — PREDNISONE 10 MG (21) PO TBPK: 10.0000 mg | ORAL_TABLET | Freq: Every day | ORAL | Status: DC

## 2015-01-20 NOTE — Progress Notes (Signed)
Patient is seen as a work--in after calling the clinic this AM reporting continued wheezing and SOB.  She was informed to report to radiology for chest xray and a work-in appointment in the clinic.  Her chest xray is negative.  She denies any pleuritic chest pain, sudden onset of SOB, diaphoresis, heart palpitation.  She notes that "I came down with a cold on Saturday evening and Sunday, I got short of breath."  On further questioning, she notes that her SOB is progressive.    She notes that her cough persists, but better.  She reports that the antibiotic has helped with her sputum production.  She notes that it has nearly resolved.  She notes compliance with ASA therapy (given her Revlimid tx).  BP 120/64 mmHg  Pulse 86  Temp(Src) 98.7 F (37.1 C) (Oral)  Resp 24  Wt 332 lb 12.8 oz (150.957 kg)  SpO2 94% Gen: SOB with wheezing when she first reported to the clinic.  Given a Duoneb treatment in the clinic and improved. HEENT: Atraumatic normocephalic. Cardiac: RRR Lungs: CTA B/L, decreased breath sounds B/L.  Minimal wheezing throughout. Skin: Warm and dry Neuro: No focal deficits.  Assessment: 1. COPD exacerbation.    Plan: 1. Continue Advair and Albuterol, prescribed by Dr. Luan Pulling. 2. Discussed case with Dr. Luan Pulling.  She is scheduled to see him on 12/29.  He recommended a Pred-Dose Pak. 3. Return as scheduled.  Patient and plan discussed with Dr. Ancil Linsey and she is in agreement with the aforementioned.   Andrez Lieurance, PA-C 01/20/2015 1:26 PM

## 2015-01-20 NOTE — Telephone Encounter (Signed)
Patient called complaining that she is still wheezing and inhalers are not helping. Discussed with T. Kefalas PA-C and patient will come for chest xray and then to [place] for follow up.

## 2015-01-20 NOTE — Patient Instructions (Signed)
[place] at [facility] Discharge Instructions  RECOMMENDATIONS MADE BY THE CONSULTANT AND ANY TEST RESULTS WILL BE SENT TO YOUR REFERRING PHYSICIAN.  Exam and discussion by Robynn Pane, PA-C Medrol dos pack - take as directed See Dr. Luan Pulling as scheduled  Follow-up as scheduled.  Thank you for choosing [place] at [name] to provide your oncology and hematology care.  To afford each patient quality time with our provider, please arrive at least 15 minutes before your scheduled appointment time.    You need to re-schedule your appointment should you arrive 10 or more minutes late.  We strive to give you quality time with our providers, and arriving late affects you and other patients whose appointments are after yours.  Also, if you no show three or more times for appointments you may be dismissed from the clinic at the providers discretion.     Again, thank you for choosing [facility].  Our hope is that these requests will decrease the amount of time that you wait before being seen by our physicians.       _____________________________________________________________  Should you have questions after your visit to [facility], please contact our office at [phone] between the hours of 8:30 a.m. and 4:30 p.m.  Voicemails left after 4:30 p.m. will not be returned until the following business day.  For prescription refill requests, have your pharmacy contact our office.

## 2015-01-25 ENCOUNTER — Encounter (HOSPITAL_BASED_OUTPATIENT_CLINIC_OR_DEPARTMENT_OTHER): Payer: Medicare Other

## 2015-01-25 VITALS — BP 150/65 | HR 87 | Temp 98.2°F | Resp 22 | Wt 328.4 lb

## 2015-01-25 DIAGNOSIS — C9000 Multiple myeloma not having achieved remission: Secondary | ICD-10-CM

## 2015-01-25 DIAGNOSIS — Z5112 Encounter for antineoplastic immunotherapy: Secondary | ICD-10-CM

## 2015-01-25 DIAGNOSIS — I10 Essential (primary) hypertension: Secondary | ICD-10-CM | POA: Diagnosis not present

## 2015-01-25 DIAGNOSIS — D509 Iron deficiency anemia, unspecified: Secondary | ICD-10-CM | POA: Diagnosis not present

## 2015-01-25 DIAGNOSIS — D72819 Decreased white blood cell count, unspecified: Secondary | ICD-10-CM | POA: Diagnosis not present

## 2015-01-25 DIAGNOSIS — Z79899 Other long term (current) drug therapy: Secondary | ICD-10-CM | POA: Diagnosis not present

## 2015-01-25 LAB — CBC WITH DIFFERENTIAL/PLATELET
BASOS PCT: 0 %
Basophils Absolute: 0 10*3/uL (ref 0.0–0.1)
EOS ABS: 0.3 10*3/uL (ref 0.0–0.7)
Eosinophils Relative: 3 %
HCT: 33.9 % — ABNORMAL LOW (ref 36.0–46.0)
HEMOGLOBIN: 10.8 g/dL — AB (ref 12.0–15.0)
LYMPHS ABS: 0.9 10*3/uL (ref 0.7–4.0)
Lymphocytes Relative: 8 %
MCH: 26.4 pg (ref 26.0–34.0)
MCHC: 31.9 g/dL (ref 30.0–36.0)
MCV: 82.9 fL (ref 78.0–100.0)
Monocytes Absolute: 0.7 10*3/uL (ref 0.1–1.0)
Monocytes Relative: 7 %
NEUTROS PCT: 82 %
Neutro Abs: 8.4 10*3/uL — ABNORMAL HIGH (ref 1.7–7.7)
Platelets: 211 10*3/uL (ref 150–400)
RBC: 4.09 MIL/uL (ref 3.87–5.11)
RDW: 17.2 % — ABNORMAL HIGH (ref 11.5–15.5)
WBC: 10.3 10*3/uL (ref 4.0–10.5)

## 2015-01-25 LAB — COMPREHENSIVE METABOLIC PANEL
ALBUMIN: 3.6 g/dL (ref 3.5–5.0)
ALK PHOS: 41 U/L (ref 38–126)
ALT: 14 U/L (ref 14–54)
AST: 17 U/L (ref 15–41)
Anion gap: 8 (ref 5–15)
BUN: 18 mg/dL (ref 6–20)
CALCIUM: 9.2 mg/dL (ref 8.9–10.3)
CO2: 29 mmol/L (ref 22–32)
CREATININE: 0.98 mg/dL (ref 0.44–1.00)
Chloride: 102 mmol/L (ref 101–111)
GFR calc Af Amer: >60 mL/min (ref >60)
GFR calc non Af Amer: >60 mL/min (ref >60)
GLUCOSE: 123 mg/dL — AB (ref 65–99)
Potassium: 2.9 mmol/L — ABNORMAL LOW (ref 3.5–5.1)
SODIUM: 139 mmol/L (ref 135–145)
Total Bilirubin: 0.9 mg/dL (ref 0.3–1.2)
Total Protein: 6.9 g/dL (ref 6.5–8.1)

## 2015-01-25 MED ORDER — ONDANSETRON HCL 4 MG PO TABS
8.0000 mg | ORAL_TABLET | Freq: Once | ORAL | Status: AC
  Administered 2015-01-25: 8 mg via ORAL

## 2015-01-25 MED ORDER — ONDANSETRON HCL 4 MG PO TABS
ORAL_TABLET | ORAL | Status: AC
  Filled 2015-01-25: qty 2

## 2015-01-25 MED ORDER — BORTEZOMIB CHEMO SQ INJECTION 3.5 MG (2.5MG/ML)
1.3000 mg/m2 | Freq: Once | INTRAMUSCULAR | Status: AC
  Administered 2015-01-25: 3.25 mg via SUBCUTANEOUS
  Filled 2015-01-25: qty 3.25

## 2015-01-25 NOTE — Progress Notes (Signed)
Tolerated chemo injection well. 

## 2015-01-25 NOTE — Progress Notes (Signed)
..  Kelli Hurley's reason for visit today are for labs as scheduled per MD orders.  Venipuncture performed with a 23 gauge butterfly needle to R Antecubital.  Kelli Hurley tolerated venipuncture well and without incident; questions were answered and patient was discharged.

## 2015-01-25 NOTE — Patient Instructions (Signed)
[facility] Discharge Instructions for Patients Receiving Chemotherapy  Today you received the following chemotherapy agents Velcade injection.  To help prevent nausea and vomiting after your treatment, we encourage you to take your nausea medication as instructed. If you develop nausea and vomiting that is not controlled by your nausea medication, call the clinic. If it is after clinic hours your family physician or the after hours number for the clinic or go to the Emergency Department. BELOW ARE SYMPTOMS THAT SHOULD BE REPORTED IMMEDIATELY:  *FEVER GREATER THAN 101.0 F  *CHILLS WITH OR WITHOUT FEVER  NAUSEA AND VOMITING THAT IS NOT CONTROLLED WITH YOUR NAUSEA MEDICATION  *UNUSUAL SHORTNESS OF BREATH  *UNUSUAL BRUISING OR BLEEDING  TENDERNESS IN MOUTH AND THROAT WITH OR WITHOUT PRESENCE OF ULCERS  *URINARY PROBLEMS  *BOWEL PROBLEMS  UNUSUAL RASH Items with * indicate a potential emergency and should be followed up as soon as possible.  Return as scheduled.  I have been informed and understand all the instructions given to me. I know to contact the clinic, my physician, or go to the Emergency Department if any problems should occur. I do not have any questions at this time, but understand that I may call the clinic during office hours or the Patient Navigator at [phone] should I have any questions or need assistance in obtaining follow up care.    __________________________________________  _____________  __________ Signature of Patient or Authorized Representative            Date                   Time    __________________________________________ Nurse's Signature  

## 2015-01-26 ENCOUNTER — Telehealth (HOSPITAL_COMMUNITY): Payer: Self-pay | Admitting: *Deleted

## 2015-01-26 NOTE — Telephone Encounter (Signed)
Call received from [facility]. The last revlimid prescription was written for a quantity of 28. They can not do more than 14, I changed the quantity to 14.

## 2015-01-28 ENCOUNTER — Other Ambulatory Visit (HOSPITAL_COMMUNITY): Payer: Self-pay | Admitting: Hematology & Oncology

## 2015-02-02 ENCOUNTER — Encounter (HOSPITAL_BASED_OUTPATIENT_CLINIC_OR_DEPARTMENT_OTHER): Payer: Medicare Other

## 2015-02-02 ENCOUNTER — Ambulatory Visit (HOSPITAL_COMMUNITY): Payer: Medicare Other | Admitting: Hematology & Oncology

## 2015-02-02 ENCOUNTER — Encounter (HOSPITAL_COMMUNITY): Payer: Self-pay

## 2015-02-02 VITALS — BP 138/63 | HR 82 | Temp 98.5°F | Resp 16 | Wt 326.2 lb

## 2015-02-02 DIAGNOSIS — Z5112 Encounter for antineoplastic immunotherapy: Secondary | ICD-10-CM

## 2015-02-02 DIAGNOSIS — C9000 Multiple myeloma not having achieved remission: Secondary | ICD-10-CM

## 2015-02-02 DIAGNOSIS — I10 Essential (primary) hypertension: Secondary | ICD-10-CM | POA: Diagnosis not present

## 2015-02-02 DIAGNOSIS — D509 Iron deficiency anemia, unspecified: Secondary | ICD-10-CM | POA: Diagnosis not present

## 2015-02-02 DIAGNOSIS — D72819 Decreased white blood cell count, unspecified: Secondary | ICD-10-CM | POA: Diagnosis not present

## 2015-02-02 DIAGNOSIS — Z79899 Other long term (current) drug therapy: Secondary | ICD-10-CM | POA: Diagnosis not present

## 2015-02-02 LAB — COMPREHENSIVE METABOLIC PANEL
ALT: 15 U/L (ref 14–54)
AST: 19 U/L (ref 15–41)
Albumin: 4 g/dL (ref 3.5–5.0)
Alkaline Phosphatase: 39 U/L (ref 38–126)
Anion gap: 5 (ref 5–15)
BUN: 18 mg/dL (ref 6–20)
CHLORIDE: 107 mmol/L (ref 101–111)
CO2: 26 mmol/L (ref 22–32)
Calcium: 9.6 mg/dL (ref 8.9–10.3)
Creatinine, Ser: 0.89 mg/dL (ref 0.44–1.00)
GFR calc Af Amer: >60 mL/min (ref >60)
Glucose, Bld: 111 mg/dL — ABNORMAL HIGH (ref 65–99)
POTASSIUM: 3.6 mmol/L (ref 3.5–5.1)
Sodium: 138 mmol/L (ref 135–145)
Total Bilirubin: 0.6 mg/dL (ref 0.3–1.2)
Total Protein: 7.3 g/dL (ref 6.5–8.1)

## 2015-02-02 LAB — C-REACTIVE PROTEIN: CRP: 0.6 mg/dL (ref <1.0)

## 2015-02-02 LAB — CBC WITH DIFFERENTIAL/PLATELET
Basophils Absolute: 0 10*3/uL (ref 0.0–0.1)
Basophils Relative: 1 %
Eosinophils Absolute: 0.1 10*3/uL (ref 0.0–0.7)
Eosinophils Relative: 1 %
HEMATOCRIT: 35 % — AB (ref 36.0–46.0)
HEMOGLOBIN: 11 g/dL — AB (ref 12.0–15.0)
LYMPHS ABS: 0.6 10*3/uL — AB (ref 0.7–4.0)
LYMPHS PCT: 13 %
MCH: 26.3 pg (ref 26.0–34.0)
MCHC: 31.4 g/dL (ref 30.0–36.0)
MCV: 83.7 fL (ref 78.0–100.0)
MONOS PCT: 3 %
Monocytes Absolute: 0.1 10*3/uL (ref 0.1–1.0)
NEUTROS ABS: 3.7 10*3/uL (ref 1.7–7.7)
NEUTROS PCT: 82 %
Platelets: 229 10*3/uL (ref 150–400)
RBC: 4.18 MIL/uL (ref 3.87–5.11)
RDW: 17.3 % — ABNORMAL HIGH (ref 11.5–15.5)
WBC: 4.4 10*3/uL (ref 4.0–10.5)

## 2015-02-02 LAB — LACTATE DEHYDROGENASE: LDH: 210 U/L — AB (ref 98–192)

## 2015-02-02 LAB — SEDIMENTATION RATE: Sed Rate: 18 mm/hr (ref 0–22)

## 2015-02-02 MED ORDER — BORTEZOMIB CHEMO SQ INJECTION 3.5 MG (2.5MG/ML)
1.3000 mg/m2 | Freq: Once | INTRAMUSCULAR | Status: AC
  Administered 2015-02-02: 3.25 mg via SUBCUTANEOUS
  Filled 2015-02-02: qty 3.25

## 2015-02-02 MED ORDER — ONDANSETRON HCL 4 MG PO TABS
ORAL_TABLET | ORAL | Status: AC
Start: 2015-02-02 — End: 2015-02-02
  Filled 2015-02-02: qty 2

## 2015-02-02 MED ORDER — ONDANSETRON HCL 4 MG PO TABS
8.0000 mg | ORAL_TABLET | Freq: Once | ORAL | Status: AC
  Administered 2015-02-02: 8 mg via ORAL

## 2015-02-02 NOTE — Patient Instructions (Signed)
[place] at [facility] Discharge Instructions  RECOMMENDATIONS MADE BY THE CONSULTANT AND ANY TEST RESULTS WILL BE SENT TO YOUR REFERRING PHYSICIAN.  Lab work and Velcade injection today. Return as scheduled for office visit and chemotherapy. Please call the clinic with any questions or concerns.  Thank you for choosing [place] at [facility] to provide your oncology and hematology care.  To afford each patient quality time with our provider, please arrive at least 15 minutes before your scheduled appointment time.    You need to re-schedule your appointment should you arrive 10 or more minutes late.  We strive to give you quality time with our providers, and arriving late affects you and other patients whose appointments are after yours.  Also, if you no show three or more times for appointments you may be dismissed from the clinic at the providers discretion.     Again, thank you for choosing [facility].  Our hope is that these requests will decrease the amount of time that you wait before being seen by our physicians.       _____________________________________________________________  Should you have questions after your visit to [facility], please contact our office at [phone] between the hours of 8:30 a.m. and 4:30 p.m.  Voicemails left after 4:30 p.m. will not be returned until the following business day.  For prescription refill requests, have your pharmacy contact our office.

## 2015-02-02 NOTE — Progress Notes (Signed)
Kelli Hurley presents today for injection per MD orders. Velcade 3.25mg administered SQ in right Abdomen. Administration without incident. Patient tolerated well.  

## 2015-02-02 NOTE — Progress Notes (Signed)
Labs drawn

## 2015-02-02 NOTE — Progress Notes (Signed)
This encounter was created in error - please disregard.

## 2015-02-03 LAB — KAPPA/LAMBDA LIGHT CHAINS
Kappa free light chain: 24.88 mg/L — ABNORMAL HIGH (ref 3.30–19.40)
Kappa, lambda light chain ratio: 1.63 (ref 0.26–1.65)
Lambda free light chains: 15.22 mg/L (ref 5.71–26.30)

## 2015-02-03 LAB — MULTIPLE MYELOMA PANEL, SERUM
ALBUMIN SERPL ELPH-MCNC: 3.8 g/dL (ref 2.9–4.4)
ALBUMIN/GLOB SERPL: 1.4 (ref 0.7–1.7)
ALPHA 1: 0.2 g/dL (ref 0.0–0.4)
ALPHA2 GLOB SERPL ELPH-MCNC: 0.5 g/dL (ref 0.4–1.0)
B-GLOBULIN SERPL ELPH-MCNC: 1 g/dL (ref 0.7–1.3)
GAMMA GLOB SERPL ELPH-MCNC: 1.1 g/dL (ref 0.4–1.8)
GLOBULIN, TOTAL: 2.8 g/dL (ref 2.2–3.9)
IGA: 63 mg/dL — AB (ref 87–352)
IGG (IMMUNOGLOBIN G), SERUM: 1133 mg/dL (ref 700–1600)
IgM, Serum: 90 mg/dL (ref 26–217)
M PROTEIN SERPL ELPH-MCNC: 0.5 g/dL — AB
Total Protein ELP: 6.6 g/dL (ref 6.0–8.5)

## 2015-02-03 LAB — BETA 2 MICROGLOBULIN, SERUM: BETA 2 MICROGLOBULIN: 2.4 mg/L (ref 0.6–2.4)

## 2015-02-04 DIAGNOSIS — I10 Essential (primary) hypertension: Secondary | ICD-10-CM | POA: Diagnosis not present

## 2015-02-04 DIAGNOSIS — C9000 Multiple myeloma not having achieved remission: Secondary | ICD-10-CM | POA: Diagnosis not present

## 2015-02-04 DIAGNOSIS — J9611 Chronic respiratory failure with hypoxia: Secondary | ICD-10-CM | POA: Diagnosis not present

## 2015-02-04 DIAGNOSIS — J449 Chronic obstructive pulmonary disease, unspecified: Secondary | ICD-10-CM | POA: Diagnosis not present

## 2015-02-09 ENCOUNTER — Encounter (HOSPITAL_BASED_OUTPATIENT_CLINIC_OR_DEPARTMENT_OTHER): Payer: Medicare Other

## 2015-02-09 ENCOUNTER — Other Ambulatory Visit (HOSPITAL_COMMUNITY): Payer: Self-pay

## 2015-02-09 ENCOUNTER — Encounter (HOSPITAL_COMMUNITY): Payer: Medicare Other | Attending: Oncology

## 2015-02-09 ENCOUNTER — Encounter (HOSPITAL_COMMUNITY): Payer: Self-pay

## 2015-02-09 VITALS — BP 128/57 | HR 85 | Temp 98.6°F | Resp 20

## 2015-02-09 DIAGNOSIS — D72819 Decreased white blood cell count, unspecified: Secondary | ICD-10-CM | POA: Diagnosis not present

## 2015-02-09 DIAGNOSIS — Z5112 Encounter for antineoplastic immunotherapy: Secondary | ICD-10-CM | POA: Diagnosis not present

## 2015-02-09 DIAGNOSIS — D509 Iron deficiency anemia, unspecified: Secondary | ICD-10-CM | POA: Diagnosis not present

## 2015-02-09 DIAGNOSIS — C9000 Multiple myeloma not having achieved remission: Secondary | ICD-10-CM

## 2015-02-09 LAB — CBC WITH DIFFERENTIAL/PLATELET
Basophils Absolute: 0 10*3/uL (ref 0.0–0.1)
Basophils Relative: 0 %
EOS ABS: 0.1 10*3/uL (ref 0.0–0.7)
EOS PCT: 2 %
HCT: 34.7 % — ABNORMAL LOW (ref 36.0–46.0)
Hemoglobin: 10.9 g/dL — ABNORMAL LOW (ref 12.0–15.0)
LYMPHS ABS: 0.7 10*3/uL (ref 0.7–4.0)
LYMPHS PCT: 15 %
MCH: 26.1 pg (ref 26.0–34.0)
MCHC: 31.4 g/dL (ref 30.0–36.0)
MCV: 83 fL (ref 78.0–100.0)
MONO ABS: 0.1 10*3/uL (ref 0.1–1.0)
MONOS PCT: 3 %
Neutro Abs: 3.7 10*3/uL (ref 1.7–7.7)
Neutrophils Relative %: 80 %
Platelets: 230 10*3/uL (ref 150–400)
RBC: 4.18 MIL/uL (ref 3.87–5.11)
RDW: 17.2 % — AB (ref 11.5–15.5)
WBC: 4.6 10*3/uL (ref 4.0–10.5)

## 2015-02-09 LAB — COMPREHENSIVE METABOLIC PANEL
ALBUMIN: 3.8 g/dL (ref 3.5–5.0)
ALK PHOS: 40 U/L (ref 38–126)
ALT: 16 U/L (ref 14–54)
ANION GAP: 10 (ref 5–15)
AST: 16 U/L (ref 15–41)
BILIRUBIN TOTAL: 0.5 mg/dL (ref 0.3–1.2)
BUN: 15 mg/dL (ref 6–20)
CALCIUM: 9.4 mg/dL (ref 8.9–10.3)
CO2: 27 mmol/L (ref 22–32)
Chloride: 103 mmol/L (ref 101–111)
Creatinine, Ser: 0.79 mg/dL (ref 0.44–1.00)
GFR calc non Af Amer: >60 mL/min (ref >60)
GLUCOSE: 108 mg/dL — AB (ref 65–99)
POTASSIUM: 3.6 mmol/L (ref 3.5–5.1)
SODIUM: 140 mmol/L (ref 135–145)
TOTAL PROTEIN: 6.7 g/dL (ref 6.5–8.1)

## 2015-02-09 MED ORDER — ONDANSETRON HCL 4 MG PO TABS
8.0000 mg | ORAL_TABLET | Freq: Once | ORAL | Status: AC
  Administered 2015-02-09: 8 mg via ORAL
  Filled 2015-02-09: qty 2

## 2015-02-09 MED ORDER — BORTEZOMIB CHEMO SQ INJECTION 3.5 MG (2.5MG/ML)
1.3000 mg/m2 | Freq: Once | INTRAMUSCULAR | Status: AC
  Administered 2015-02-09: 3.25 mg via SUBCUTANEOUS
  Filled 2015-02-09: qty 3.25

## 2015-02-09 NOTE — Progress Notes (Signed)
1100:  Velcade injection given SQ as ordered.  A&Ox4, in no distress.  Discharged via wheelchair in c/o family for transport home.

## 2015-02-09 NOTE — Patient Instructions (Signed)
[facility] Discharge Instructions for Patients Receiving Chemotherapy  Today you received the following chemotherapy agents:  Velcade  If you develop nausea and vomiting, or diarrhea that is not controlled by your medication, call the clinic.  The clinic phone number is [phone]. Office hours are Monday-Friday 8:30am-5:00pm.  BELOW ARE SYMPTOMS THAT SHOULD BE REPORTED IMMEDIATELY:  *FEVER GREATER THAN 101.0 F  *CHILLS WITH OR WITHOUT FEVER  NAUSEA AND VOMITING THAT IS NOT CONTROLLED WITH YOUR NAUSEA MEDICATION  *UNUSUAL SHORTNESS OF BREATH  *UNUSUAL BRUISING OR BLEEDING  TENDERNESS IN MOUTH AND THROAT WITH OR WITHOUT PRESENCE OF ULCERS  *URINARY PROBLEMS  *BOWEL PROBLEMS  UNUSUAL RASH Items with * indicate a potential emergency and should be followed up as soon as possible. If you have an emergency after office hours please contact your primary care physician or go to the nearest emergency department.  Please call the clinic during office hours if you have any questions or concerns.   You may also contact the Patient Navigator at [phone] should you have any questions or need assistance in obtaining follow up care.

## 2015-02-11 NOTE — Progress Notes (Signed)
LABS DRAWN

## 2015-02-15 ENCOUNTER — Inpatient Hospital Stay (HOSPITAL_COMMUNITY): Payer: Medicare Other

## 2015-02-15 ENCOUNTER — Ambulatory Visit (HOSPITAL_COMMUNITY): Payer: Medicare Other | Admitting: Hematology & Oncology

## 2015-02-15 ENCOUNTER — Other Ambulatory Visit (HOSPITAL_COMMUNITY): Payer: Medicare Other

## 2015-02-15 ENCOUNTER — Other Ambulatory Visit (HOSPITAL_COMMUNITY): Payer: Self-pay | Admitting: Oncology

## 2015-02-15 ENCOUNTER — Ambulatory Visit (HOSPITAL_COMMUNITY): Payer: Medicare Other

## 2015-02-15 DIAGNOSIS — C9001 Multiple myeloma in remission: Secondary | ICD-10-CM

## 2015-02-15 MED ORDER — REVLIMID 25 MG PO CAPS: 25.0000 mg | ORAL_CAPSULE | Freq: Every day | ORAL | Status: DC

## 2015-02-16 ENCOUNTER — Encounter (HOSPITAL_COMMUNITY): Payer: Medicare Other

## 2015-02-16 ENCOUNTER — Inpatient Hospital Stay (HOSPITAL_COMMUNITY): Payer: Medicare Other

## 2015-02-16 ENCOUNTER — Encounter (HOSPITAL_BASED_OUTPATIENT_CLINIC_OR_DEPARTMENT_OTHER): Payer: Medicare Other

## 2015-02-16 ENCOUNTER — Encounter (HOSPITAL_COMMUNITY): Payer: Self-pay

## 2015-02-16 ENCOUNTER — Encounter: Payer: Self-pay | Admitting: *Deleted

## 2015-02-16 VITALS — BP 107/67 | HR 77 | Temp 98.3°F | Resp 18

## 2015-02-16 VITALS — BP 143/78 | HR 85 | Temp 98.1°F | Resp 20

## 2015-02-16 DIAGNOSIS — Z5112 Encounter for antineoplastic immunotherapy: Secondary | ICD-10-CM | POA: Diagnosis present

## 2015-02-16 DIAGNOSIS — C9000 Multiple myeloma not having achieved remission: Secondary | ICD-10-CM

## 2015-02-16 DIAGNOSIS — D509 Iron deficiency anemia, unspecified: Secondary | ICD-10-CM | POA: Diagnosis not present

## 2015-02-16 DIAGNOSIS — D72819 Decreased white blood cell count, unspecified: Secondary | ICD-10-CM | POA: Diagnosis not present

## 2015-02-16 LAB — COMPREHENSIVE METABOLIC PANEL
ALBUMIN: 4.1 g/dL (ref 3.5–5.0)
ALK PHOS: 44 U/L (ref 38–126)
ALT: 17 U/L (ref 14–54)
AST: 20 U/L (ref 15–41)
Anion gap: 8 (ref 5–15)
BILIRUBIN TOTAL: 0.7 mg/dL (ref 0.3–1.2)
BUN: 12 mg/dL (ref 6–20)
CO2: 28 mmol/L (ref 22–32)
Calcium: 9.5 mg/dL (ref 8.9–10.3)
Chloride: 104 mmol/L (ref 101–111)
Creatinine, Ser: 0.83 mg/dL (ref 0.44–1.00)
GFR calc Af Amer: >60 mL/min (ref >60)
GLUCOSE: 99 mg/dL (ref 65–99)
POTASSIUM: 3.8 mmol/L (ref 3.5–5.1)
Sodium: 140 mmol/L (ref 135–145)
TOTAL PROTEIN: 7.3 g/dL (ref 6.5–8.1)

## 2015-02-16 LAB — CBC WITH DIFFERENTIAL/PLATELET
BASOS ABS: 0 10*3/uL (ref 0.0–0.1)
BASOS PCT: 0 %
Eosinophils Absolute: 0.1 10*3/uL (ref 0.0–0.7)
Eosinophils Relative: 2 %
HEMATOCRIT: 37 % (ref 36.0–46.0)
HEMOGLOBIN: 11.7 g/dL — AB (ref 12.0–15.0)
LYMPHS PCT: 10 %
Lymphs Abs: 0.6 10*3/uL — ABNORMAL LOW (ref 0.7–4.0)
MCH: 26.2 pg (ref 26.0–34.0)
MCHC: 31.6 g/dL (ref 30.0–36.0)
MCV: 83 fL (ref 78.0–100.0)
Monocytes Absolute: 0.2 10*3/uL (ref 0.1–1.0)
Monocytes Relative: 4 %
NEUTROS ABS: 4.6 10*3/uL (ref 1.7–7.7)
NEUTROS PCT: 84 %
Platelets: 181 10*3/uL (ref 150–400)
RBC: 4.46 MIL/uL (ref 3.87–5.11)
RDW: 17 % — AB (ref 11.5–15.5)
WBC: 5.5 10*3/uL (ref 4.0–10.5)

## 2015-02-16 MED ORDER — BORTEZOMIB CHEMO SQ INJECTION 3.5 MG (2.5MG/ML)
1.3000 mg/m2 | Freq: Once | INTRAMUSCULAR | Status: AC
  Administered 2015-02-16: 3.25 mg via SUBCUTANEOUS
  Filled 2015-02-16: qty 3.25

## 2015-02-16 MED ORDER — ZOLEDRONIC ACID 4 MG/5ML IV CONC
4.0000 mg | Freq: Once | INTRAVENOUS | Status: AC
  Administered 2015-02-16: 4 mg via INTRAVENOUS
  Filled 2015-02-16: qty 5

## 2015-02-16 MED ORDER — ONDANSETRON HCL 4 MG PO TABS
8.0000 mg | ORAL_TABLET | Freq: Once | ORAL | Status: AC
  Administered 2015-02-16: 8 mg via ORAL
  Filled 2015-02-16: qty 2

## 2015-02-16 MED ORDER — SODIUM CHLORIDE 0.9 % IV SOLN
Freq: Once | INTRAVENOUS | Status: AC
  Administered 2015-02-16: 11:00:00 via INTRAVENOUS

## 2015-02-16 NOTE — Progress Notes (Signed)
[place] Clinical Social Work  Clinical Social Work was referred by [place] rounding for assessment of psychosocial needs due to CSW not working with pt to date. Clinical Social Worker met with patient during treatment to offer support and assess for needs.  CSW introduced self and explained role of CSW. CSW provided pt with resource assistance sheet and contact for CSW. Pt denied current needs, but agrees to reach out as needed.   Clinical Social Work interventions: Resource education  Loren Racer, [place] Tuesdays   [phone]

## 2015-02-16 NOTE — Progress Notes (Signed)
See second encounter for documentation

## 2015-02-16 NOTE — Progress Notes (Signed)
..  Kelli Hurley presents today for injection per the provider's orders.  velcade administration without incident; see MAR for injection details.  Patient tolerated procedure well and without incident.  No questions or complaints noted at this time. 

## 2015-02-16 NOTE — Patient Instructions (Signed)
..  [facility] Discharge Instructions for Patients Receiving Chemotherapy  Today you received the following chemotherapy agents Velcade and zometa     If you develop nausea and vomiting, or diarrhea that is not controlled by your medication, call the clinic.  The clinic phone number is [phone]. Office hours are Monday-Friday 8:30am-5:00pm.  BELOW ARE SYMPTOMS THAT SHOULD BE REPORTED IMMEDIATELY:  *FEVER GREATER THAN 101.0 F  *CHILLS WITH OR WITHOUT FEVER  NAUSEA AND VOMITING THAT IS NOT CONTROLLED WITH YOUR NAUSEA MEDICATION  *UNUSUAL SHORTNESS OF BREATH  *UNUSUAL BRUISING OR BLEEDING  TENDERNESS IN MOUTH AND THROAT WITH OR WITHOUT PRESENCE OF ULCERS  *URINARY PROBLEMS  *BOWEL PROBLEMS  UNUSUAL RASH Items with * indicate a potential emergency and should be followed up as soon as possible. If you have an emergency after office hours please contact your primary care physician or go to the nearest emergency department.  Please call the clinic during office hours if you have any questions or concerns.   You may also contact the Patient Navigator at [phone] should you have any questions or need assistance in obtaining follow up care.

## 2015-02-17 ENCOUNTER — Telehealth (HOSPITAL_COMMUNITY): Payer: Self-pay | Admitting: Oncology

## 2015-02-17 NOTE — Telephone Encounter (Signed)
Rcvd a new script for Revlimid for the pt but unable to obtain [employer] auth because the prior auth does not expire until 02/25/15. Pt also called to notify us that script will be due soon.  I will obtain a new auth as soon as I'm allowed to.

## 2015-02-18 ENCOUNTER — Other Ambulatory Visit (HOSPITAL_COMMUNITY): Payer: Self-pay | Admitting: *Deleted

## 2015-02-18 DIAGNOSIS — C9001 Multiple myeloma in remission: Secondary | ICD-10-CM

## 2015-02-18 MED ORDER — REVLIMID 25 MG PO CAPS: 25.0000 mg | ORAL_CAPSULE | Freq: Every day | ORAL | Status: DC

## 2015-02-22 ENCOUNTER — Other Ambulatory Visit (HOSPITAL_COMMUNITY): Payer: Self-pay | Admitting: *Deleted

## 2015-02-22 ENCOUNTER — Other Ambulatory Visit (HOSPITAL_COMMUNITY): Payer: Self-pay | Admitting: Oncology

## 2015-02-22 ENCOUNTER — Encounter (HOSPITAL_COMMUNITY): Payer: Medicare Other

## 2015-02-22 ENCOUNTER — Encounter (HOSPITAL_BASED_OUTPATIENT_CLINIC_OR_DEPARTMENT_OTHER): Payer: Medicare Other

## 2015-02-22 VITALS — BP 129/77 | HR 90 | Temp 98.1°F | Resp 95 | Wt 329.0 lb

## 2015-02-22 DIAGNOSIS — C9000 Multiple myeloma not having achieved remission: Secondary | ICD-10-CM | POA: Diagnosis not present

## 2015-02-22 DIAGNOSIS — D72819 Decreased white blood cell count, unspecified: Secondary | ICD-10-CM | POA: Diagnosis not present

## 2015-02-22 DIAGNOSIS — Z5112 Encounter for antineoplastic immunotherapy: Secondary | ICD-10-CM | POA: Diagnosis present

## 2015-02-22 DIAGNOSIS — D509 Iron deficiency anemia, unspecified: Secondary | ICD-10-CM | POA: Diagnosis not present

## 2015-02-22 LAB — COMPREHENSIVE METABOLIC PANEL
ALK PHOS: 48 U/L (ref 38–126)
ALT: 16 U/L (ref 14–54)
AST: 22 U/L (ref 15–41)
Albumin: 4 g/dL (ref 3.5–5.0)
Anion gap: 8 (ref 5–15)
BILIRUBIN TOTAL: 0.4 mg/dL (ref 0.3–1.2)
BUN: 18 mg/dL (ref 6–20)
CALCIUM: 9.3 mg/dL (ref 8.9–10.3)
CO2: 25 mmol/L (ref 22–32)
CREATININE: 0.8 mg/dL (ref 0.44–1.00)
Chloride: 105 mmol/L (ref 101–111)
Glucose, Bld: 142 mg/dL — ABNORMAL HIGH (ref 65–99)
Potassium: 3.6 mmol/L (ref 3.5–5.1)
Sodium: 138 mmol/L (ref 135–145)
Total Protein: 7.5 g/dL (ref 6.5–8.1)

## 2015-02-22 LAB — CBC WITH DIFFERENTIAL/PLATELET
Basophils Absolute: 0 10*3/uL (ref 0.0–0.1)
Basophils Relative: 0 %
Eosinophils Absolute: 0 10*3/uL (ref 0.0–0.7)
Eosinophils Relative: 0 %
HEMATOCRIT: 34.5 % — AB (ref 36.0–46.0)
HEMOGLOBIN: 11.2 g/dL — AB (ref 12.0–15.0)
LYMPHS ABS: 0.6 10*3/uL — AB (ref 0.7–4.0)
LYMPHS PCT: 10 %
MCH: 26.5 pg (ref 26.0–34.0)
MCHC: 32.5 g/dL (ref 30.0–36.0)
MCV: 81.6 fL (ref 78.0–100.0)
Monocytes Absolute: 0.1 10*3/uL (ref 0.1–1.0)
Monocytes Relative: 1 %
NEUTROS PCT: 89 %
Neutro Abs: 5.3 10*3/uL (ref 1.7–7.7)
Platelets: 183 10*3/uL (ref 150–400)
RBC: 4.23 MIL/uL (ref 3.87–5.11)
RDW: 16.6 % — ABNORMAL HIGH (ref 11.5–15.5)
WBC: 6 10*3/uL (ref 4.0–10.5)

## 2015-02-22 LAB — C-REACTIVE PROTEIN: CRP: 2.3 mg/dL — ABNORMAL HIGH (ref <1.0)

## 2015-02-22 LAB — SEDIMENTATION RATE: SED RATE: 61 mm/h — AB (ref 0–22)

## 2015-02-22 LAB — LACTATE DEHYDROGENASE: LDH: 221 U/L — AB (ref 98–192)

## 2015-02-22 MED ORDER — ONDANSETRON HCL 4 MG PO TABS
8.0000 mg | ORAL_TABLET | Freq: Once | ORAL | Status: AC
  Administered 2015-02-22: 8 mg via ORAL

## 2015-02-22 MED ORDER — ONDANSETRON HCL 4 MG PO TABS
ORAL_TABLET | ORAL | Status: AC
  Filled 2015-02-22: qty 2

## 2015-02-22 MED ORDER — BORTEZOMIB CHEMO SQ INJECTION 3.5 MG (2.5MG/ML)
1.3000 mg/m2 | Freq: Once | INTRAMUSCULAR | Status: AC
  Administered 2015-02-22: 3.25 mg via SUBCUTANEOUS
  Filled 2015-02-22: qty 3.25

## 2015-02-22 NOTE — Patient Instructions (Signed)
[facility] Discharge Instructions for Patients Receiving Chemotherapy  Today you received the following chemotherapy agents Velcade injection.  If you develop nausea and vomiting that is not controlled by your nausea medication, call the clinic. If it is after clinic hours your family physician or the after hours number for the clinic or go to the Emergency Department. BELOW ARE SYMPTOMS THAT SHOULD BE REPORTED IMMEDIATELY:  *FEVER GREATER THAN 101.0 F  *CHILLS WITH OR WITHOUT FEVER  NAUSEA AND VOMITING THAT IS NOT CONTROLLED WITH YOUR NAUSEA MEDICATION  *UNUSUAL SHORTNESS OF BREATH  *UNUSUAL BRUISING OR BLEEDING  TENDERNESS IN MOUTH AND THROAT WITH OR WITHOUT PRESENCE OF ULCERS  *URINARY PROBLEMS  *BOWEL PROBLEMS  UNUSUAL RASH Items with * indicate a potential emergency and should be followed up as soon as possible.  Return as scheduled.  I have been informed and understand all the instructions given to me. I know to contact the clinic, my physician, or go to the Emergency Department if any problems should occur. I do not have any questions at this time, but understand that I may call the clinic during office hours or the Patient Navigator at [phone] should I have any questions or need assistance in obtaining follow up care.    __________________________________________  _____________  __________ Signature of Patient or Authorized Representative            Date                   Time    __________________________________________ Nurse's Signature

## 2015-02-22 NOTE — Progress Notes (Signed)
Ambulatory on discharge home with daughter.

## 2015-02-23 LAB — KAPPA/LAMBDA LIGHT CHAINS
KAPPA FREE LGHT CHN: 19.06 mg/L (ref 3.30–19.40)
Kappa, lambda light chain ratio: 1.3 (ref 0.26–1.65)
LAMDA FREE LIGHT CHAINS: 14.63 mg/L (ref 5.71–26.30)

## 2015-02-23 LAB — PROTEIN ELECTROPHORESIS, SERUM
A/G RATIO SPE: 1.1 (ref 0.7–1.7)
ALBUMIN ELP: 3.5 g/dL (ref 2.9–4.4)
ALPHA-1-GLOBULIN: 0.3 g/dL (ref 0.0–0.4)
Alpha-2-Globulin: 0.7 g/dL (ref 0.4–1.0)
Beta Globulin: 1.1 g/dL (ref 0.7–1.3)
GLOBULIN, TOTAL: 3.2 g/dL (ref 2.2–3.9)
Gamma Globulin: 1.1 g/dL (ref 0.4–1.8)
M-SPIKE, %: 0.5 g/dL — AB
Total Protein ELP: 6.7 g/dL (ref 6.0–8.5)

## 2015-02-23 LAB — IMMUNOFIXATION ELECTROPHORESIS
IGA: 53 mg/dL — AB (ref 87–352)
IGG (IMMUNOGLOBIN G), SERUM: 1092 mg/dL (ref 700–1600)
IGM, SERUM: 108 mg/dL (ref 26–217)
TOTAL PROTEIN ELP: 6.7 g/dL (ref 6.0–8.5)

## 2015-02-23 LAB — BETA 2 MICROGLOBULIN, SERUM: BETA 2 MICROGLOBULIN: 2.5 mg/L — AB (ref 0.6–2.4)

## 2015-03-01 ENCOUNTER — Encounter (HOSPITAL_BASED_OUTPATIENT_CLINIC_OR_DEPARTMENT_OTHER): Payer: Medicare Other

## 2015-03-01 ENCOUNTER — Other Ambulatory Visit (HOSPITAL_COMMUNITY): Payer: Self-pay | Admitting: Oncology

## 2015-03-01 ENCOUNTER — Encounter (HOSPITAL_COMMUNITY): Payer: Medicare Other

## 2015-03-01 VITALS — BP 130/66 | HR 86 | Temp 98.6°F | Resp 18

## 2015-03-01 DIAGNOSIS — Z5112 Encounter for antineoplastic immunotherapy: Secondary | ICD-10-CM | POA: Diagnosis present

## 2015-03-01 DIAGNOSIS — D72819 Decreased white blood cell count, unspecified: Secondary | ICD-10-CM | POA: Diagnosis not present

## 2015-03-01 DIAGNOSIS — C9000 Multiple myeloma not having achieved remission: Secondary | ICD-10-CM | POA: Diagnosis not present

## 2015-03-01 DIAGNOSIS — D509 Iron deficiency anemia, unspecified: Secondary | ICD-10-CM | POA: Diagnosis not present

## 2015-03-01 LAB — CBC WITH DIFFERENTIAL/PLATELET
BASOS ABS: 0 10*3/uL (ref 0.0–0.1)
BASOS PCT: 0 %
EOS ABS: 0.1 10*3/uL (ref 0.0–0.7)
Eosinophils Relative: 1 %
HCT: 35.6 % — ABNORMAL LOW (ref 36.0–46.0)
Hemoglobin: 11.2 g/dL — ABNORMAL LOW (ref 12.0–15.0)
Lymphocytes Relative: 8 %
Lymphs Abs: 0.5 10*3/uL — ABNORMAL LOW (ref 0.7–4.0)
MCH: 26 pg (ref 26.0–34.0)
MCHC: 31.5 g/dL (ref 30.0–36.0)
MCV: 82.8 fL (ref 78.0–100.0)
Monocytes Absolute: 0.1 10*3/uL (ref 0.1–1.0)
Monocytes Relative: 2 %
Neutro Abs: 5.2 10*3/uL (ref 1.7–7.7)
Neutrophils Relative %: 89 %
PLATELETS: 188 10*3/uL (ref 150–400)
RBC: 4.3 MIL/uL (ref 3.87–5.11)
RDW: 16.6 % — AB (ref 11.5–15.5)
WBC: 5.8 10*3/uL (ref 4.0–10.5)

## 2015-03-01 LAB — COMPREHENSIVE METABOLIC PANEL
ALBUMIN: 3.6 g/dL (ref 3.5–5.0)
ALT: 14 U/L (ref 14–54)
AST: 19 U/L (ref 15–41)
Alkaline Phosphatase: 47 U/L (ref 38–126)
Anion gap: 9 (ref 5–15)
BILIRUBIN TOTAL: 0.5 mg/dL (ref 0.3–1.2)
BUN: 10 mg/dL (ref 6–20)
CHLORIDE: 103 mmol/L (ref 101–111)
CO2: 26 mmol/L (ref 22–32)
Calcium: 7.7 mg/dL — ABNORMAL LOW (ref 8.9–10.3)
Creatinine, Ser: 0.85 mg/dL (ref 0.44–1.00)
GFR calc Af Amer: >60 mL/min (ref >60)
GFR calc non Af Amer: >60 mL/min (ref >60)
GLUCOSE: 110 mg/dL — AB (ref 65–99)
POTASSIUM: 3.2 mmol/L — AB (ref 3.5–5.1)
SODIUM: 138 mmol/L (ref 135–145)
TOTAL PROTEIN: 6.5 g/dL (ref 6.5–8.1)

## 2015-03-01 MED ORDER — BORTEZOMIB CHEMO SQ INJECTION 3.5 MG (2.5MG/ML)
1.3000 mg/m2 | Freq: Once | INTRAMUSCULAR | Status: AC
  Administered 2015-03-01: 3.25 mg via SUBCUTANEOUS
  Filled 2015-03-01: qty 3.25

## 2015-03-01 MED ORDER — ONDANSETRON HCL 4 MG PO TABS
ORAL_TABLET | ORAL | Status: AC
  Filled 2015-03-01: qty 2

## 2015-03-01 MED ORDER — ONDANSETRON HCL 4 MG PO TABS
8.0000 mg | ORAL_TABLET | Freq: Once | ORAL | Status: AC
  Administered 2015-03-01: 8 mg via ORAL

## 2015-03-01 NOTE — Progress Notes (Signed)
Corrin Parker presents today for injection per MD orders. Velcade administered SQ in left Abdomen. Administration without incident. Patient tolerated well.  Patient education provided both verbal and written regarding lab values and PA instruction on medications.  Patient and daughter verbalized understanding.

## 2015-03-01 NOTE — Patient Instructions (Signed)
[place] at [facility] Discharge Instructions  RECOMMENDATIONS MADE BY THE CONSULTANT AND ANY TEST RESULTS WILL BE SENT TO YOUR REFERRING PHYSICIAN.  Velcade today. Please be sure to take your calcium.  It is over the counter and you need to take at least 500mg  daily.  It is on your medication list.    Please take 2 potassium tablets (42meq) daily for 7 days and then return to one per day.  Your potassium level was a little low and this needs to be corrected.    Thank you for choosing [place] at [facility] to provide your oncology and hematology care.  To afford each patient quality time with our provider, please arrive at least 15 minutes before your scheduled appointment time.   Beginning January 23rd 2017 lab work for the [name] will be done in the  Main lab at [facility] on 1st floor. If you have a lab appointment with the [place] please come in thru the  Main Entrance and check in at the main information desk  You need to re-schedule your appointment should you arrive 10 or more minutes late.  We strive to give you quality time with our providers, and arriving late affects you and other patients whose appointments are after yours.  Also, if you no show three or more times for appointments you may be dismissed from the clinic at the providers discretion.     Again, thank you for choosing [facility].  Our hope is that these requests will decrease the amount of time that you wait before being seen by our physicians.       _____________________________________________________________  Should you have questions after your visit to [facility], please contact our office at [phone] between the hours of 8:30 a.m. and 4:30 p.m.  Voicemails left after 4:30 p.m. will not be returned until the following business day.  For prescription refill requests, have your pharmacy contact our office.

## 2015-03-08 ENCOUNTER — Encounter (HOSPITAL_BASED_OUTPATIENT_CLINIC_OR_DEPARTMENT_OTHER): Payer: Medicare Other

## 2015-03-08 ENCOUNTER — Other Ambulatory Visit (HOSPITAL_COMMUNITY): Payer: Self-pay | Admitting: Oncology

## 2015-03-08 ENCOUNTER — Encounter (HOSPITAL_COMMUNITY): Payer: Self-pay | Admitting: Lab

## 2015-03-08 ENCOUNTER — Encounter (HOSPITAL_COMMUNITY): Payer: Medicare Other

## 2015-03-08 ENCOUNTER — Encounter (HOSPITAL_COMMUNITY): Payer: Self-pay | Admitting: Hematology & Oncology

## 2015-03-08 ENCOUNTER — Encounter (HOSPITAL_BASED_OUTPATIENT_CLINIC_OR_DEPARTMENT_OTHER): Payer: Medicare Other | Admitting: Hematology & Oncology

## 2015-03-08 VITALS — BP 150/70 | HR 86 | Temp 97.9°F | Resp 20 | Wt 324.0 lb

## 2015-03-08 DIAGNOSIS — C9000 Multiple myeloma not having achieved remission: Secondary | ICD-10-CM

## 2015-03-08 DIAGNOSIS — E876 Hypokalemia: Secondary | ICD-10-CM

## 2015-03-08 DIAGNOSIS — D509 Iron deficiency anemia, unspecified: Secondary | ICD-10-CM | POA: Diagnosis not present

## 2015-03-08 DIAGNOSIS — D72819 Decreased white blood cell count, unspecified: Secondary | ICD-10-CM | POA: Diagnosis not present

## 2015-03-08 DIAGNOSIS — T502X5A Adverse effect of carbonic-anhydrase inhibitors, benzothiadiazides and other diuretics, initial encounter: Principal | ICD-10-CM

## 2015-03-08 DIAGNOSIS — Z5112 Encounter for antineoplastic immunotherapy: Secondary | ICD-10-CM | POA: Diagnosis present

## 2015-03-08 DIAGNOSIS — C9001 Multiple myeloma in remission: Secondary | ICD-10-CM

## 2015-03-08 LAB — CBC WITH DIFFERENTIAL/PLATELET
Basophils Absolute: 0 10*3/uL (ref 0.0–0.1)
Basophils Relative: 0 %
EOS ABS: 0.1 10*3/uL (ref 0.0–0.7)
EOS PCT: 1 %
HCT: 35.3 % — ABNORMAL LOW (ref 36.0–46.0)
HEMOGLOBIN: 11.2 g/dL — AB (ref 12.0–15.0)
LYMPHS ABS: 0.6 10*3/uL — AB (ref 0.7–4.0)
LYMPHS PCT: 11 %
MCH: 26.2 pg (ref 26.0–34.0)
MCHC: 31.7 g/dL (ref 30.0–36.0)
MCV: 82.7 fL (ref 78.0–100.0)
MONOS PCT: 4 %
Monocytes Absolute: 0.2 10*3/uL (ref 0.1–1.0)
Neutro Abs: 4.8 10*3/uL (ref 1.7–7.7)
Neutrophils Relative %: 84 %
PLATELETS: 206 10*3/uL (ref 150–400)
RBC: 4.27 MIL/uL (ref 3.87–5.11)
RDW: 16.9 % — ABNORMAL HIGH (ref 11.5–15.5)
WBC: 5.7 10*3/uL (ref 4.0–10.5)

## 2015-03-08 LAB — COMPREHENSIVE METABOLIC PANEL
ALK PHOS: 40 U/L (ref 38–126)
ALT: 18 U/L (ref 14–54)
ANION GAP: 11 (ref 5–15)
AST: 20 U/L (ref 15–41)
Albumin: 3.8 g/dL (ref 3.5–5.0)
BUN: 15 mg/dL (ref 6–20)
CALCIUM: 9.9 mg/dL (ref 8.9–10.3)
CO2: 27 mmol/L (ref 22–32)
CREATININE: 0.91 mg/dL (ref 0.44–1.00)
Chloride: 99 mmol/L — ABNORMAL LOW (ref 101–111)
Glucose, Bld: 108 mg/dL — ABNORMAL HIGH (ref 65–99)
Potassium: 3.7 mmol/L (ref 3.5–5.1)
Sodium: 137 mmol/L (ref 135–145)
Total Bilirubin: 0.8 mg/dL (ref 0.3–1.2)
Total Protein: 6.9 g/dL (ref 6.5–8.1)

## 2015-03-08 MED ORDER — ONDANSETRON HCL 4 MG PO TABS
8.0000 mg | ORAL_TABLET | Freq: Once | ORAL | Status: AC
  Administered 2015-03-08: 8 mg via ORAL

## 2015-03-08 MED ORDER — POTASSIUM CHLORIDE CRYS ER 20 MEQ PO TBCR: 20.0000 meq | EXTENDED_RELEASE_TABLET | Freq: Two times a day (BID) | ORAL | Status: DC

## 2015-03-08 MED ORDER — ONDANSETRON HCL 4 MG PO TABS
ORAL_TABLET | ORAL | Status: AC
  Filled 2015-03-08: qty 2

## 2015-03-08 MED ORDER — REVLIMID 25 MG PO CAPS: 25.0000 mg | ORAL_CAPSULE | Freq: Every day | ORAL | Status: DC

## 2015-03-08 MED ORDER — BORTEZOMIB CHEMO SQ INJECTION 3.5 MG (2.5MG/ML)
1.3000 mg/m2 | Freq: Once | INTRAMUSCULAR | Status: AC
  Administered 2015-03-08: 3.25 mg via SUBCUTANEOUS
  Filled 2015-03-08: qty 3.25

## 2015-03-08 NOTE — Progress Notes (Signed)
[facility] Hematology/Oncology Progress Note   Name: Kelli Hurley      MRN: [id number]    Date: 03/08/2015 Time:10:26 AM   REFERRING PHYSICIAN:  Dr. Charlotte Sanes   DIAGNOSIS:  Multiple Myeloma IgG kappa, STAGE II, serum albumin 3.5 g/dl  HISTORY OF PRESENT ILLNESS:   Kelli Hurley is a 60 year old black American woman with a past medical history significant for HTN and obesity who was referred to [facility] for anemia, leukopenia, and cryglobulinemia. Kelli Hurley has been diagnosed with multiple myeloma. Kelli Hurley is currently on RVD therapy.   Kelli Hurley is accompanied by Kelli Hurley daughter and here to discuss recent laboratory results. Kelli Hurley has been taking potassium 20 mg once daily without missing a dose. Kelli Hurley is taking aspirin daily. Kelli Hurley is not having any issues with Kelli Hurley revlimid. Denies issues with velcade.  Patient states that Kelli Hurley has not heard back from [place] that Kelli Hurley legs and feet feel cold all the time, this is not painful - just cold. Kelli Hurley is able to walk okay. Kelli Hurley is using oxygen less. Kelli Hurley has not had a sleep study. Kelli Hurley is scheduled to see Dr. Luan Pulling, pulmonary in February. Kelli Hurley legs have not been swelling as badly. Kelli Hurley mood is good and Kelli Hurley is continuing to stay positive. Denies current bowel issues, however Kelli Hurley notes having diarrhea last week attributed to a stomach bug. This has resolved.  PAST MEDICAL HISTORY:   Past Medical History  Diagnosis Date  . Hypertension   . Anemia   . Normocytic hypochromic anemia 08/03/2014  . Leukopenia 08/03/2014  . Claustrophobia 10/05/2014    ALLERGIES: No Known Allergies    MEDICATIONS: I have reviewed the patient's current medications.    Current Outpatient Prescriptions on File Prior to Visit  Medication Sig Dispense Refill  . acyclovir (ZOVIRAX) 400 MG tablet TAKE 1 TABLET BY MOUTH TWICE DAILY 60 tablet 2  . albuterol (PROVENTIL HFA;VENTOLIN HFA) 108 (90 BASE) MCG/ACT inhaler Inhale 1 puff into the lungs every 6  (six) hours as needed for wheezing or shortness of breath.    Marland Kitchen amoxicillin-clavulanate (AUGMENTIN) 875-125 MG tablet Take 1 tablet by mouth 2 (two) times daily. 20 tablet 0  . aspirin 325 MG tablet Take 325 mg by mouth daily.    . benzonatate (TESSALON) 200 MG capsule Take 1 capsule (200 mg total) by mouth 3 (three) times daily as needed for cough. 30 capsule 0  . Bortezomib (VELCADE IJ) Inject as directed. Reported on 01/20/2015    . calcium-vitamin D (OSCAL WITH D) 500-200 MG-UNIT tablet Take 2 tablets by mouth daily with breakfast. 60 tablet 5  . dexamethasone (DECADRON) 4 MG tablet TAKE 10 TABLETS BY MOUTH ONCE DAILY ON DAYS 1, 8 AND 15 OF CHEMO. REPEAT EVERY 21 DAYS. 30 tablet 2  . diazepam (VALIUM) 5 MG tablet Take 1 tab 1 hour prior to procedure. Then take 1-2 tablets when you arrive if needed. 5 tablet 0  . hydrochlorothiazide (HYDRODIURIL) 25 MG tablet Take 25 mg by mouth every morning.    Marland Kitchen ibuprofen (ADVIL,MOTRIN) 200 MG tablet Take 200 mg by mouth every 6 (six) hours as needed.    . ondansetron (ZOFRAN) 8 MG tablet Take 8 mg by mouth every 8 (eight) hours as needed for nausea or vomiting.     . potassium chloride SA (K-DUR,KLOR-CON) 20 MEQ tablet Take 1 tablet (20 mEq total) by mouth daily. 30 tablet 3  . predniSONE (STERAPRED  UNI-PAK 21 TAB) 10 MG (21) TBPK tablet Take 1 tablet (10 mg total) by mouth daily. As directed 21 tablet 0  . prochlorperazine (COMPAZINE) 10 MG tablet Take 10 mg by mouth every 6 (six) hours as needed for nausea or vomiting.     Marland Kitchen REVLIMID 25 MG capsule Take 1 capsule (25 mg total) by mouth daily. Take 14 days on and 7 days off 14 capsule 0  . vitamin C (ASCORBIC ACID) 500 MG tablet Take 500 mg by mouth daily.     Current Facility-Administered Medications on File Prior to Visit  Medication Dose Route Frequency Provider Last Rate Last Dose  . heparin lock flush 100 unit/mL  500 Units Intravenous Once Patrici Ranks, MD      . sodium chloride 0.9 % injection  10 mL  10 mL Intravenous Once Patrici Ranks, MD         PAST SURGICAL HISTORY Past Surgical History  Procedure Laterality Date  . Abdominal hysterectomy    . Other surgical history      heart surgery as infant to "repair hole in heart"    FAMILY HISTORY: Family History  Problem Relation Age of Onset  . Cancer Mother   . Hypertension Mother   . Cancer Father   . Hypertension Father   . Cancer Maternal Grandmother     SOCIAL HISTORY: Kelli Hurley reports that Kelli Hurley used to smoke as a teenager, but quit > 40 years ago.  Kelli Hurley denies EtOH abuse.  Kelli Hurley denies illicit drug abuse.  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  Kelli Hurley live byherself.  Kelli Hurley is divorced with two daughters ages 62 and 12 who are healthy.  Kelli Hurley grandbaby is healthy too.  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  PERFORMANCE STATUS: The patient's performance status is 1 - Symptomatic but completely ambulatory  ROS: Neurological: Positive for neuropathy.    Legs and feet feel cold all the time, not painful and does not inhibit ambulation.  14 point review of systems was performed and is negative except as detailed under history of present illness and above  PHYSICAL EXAM: Most Recent Vital Signs: Blood pressure 150/70, pulse 86, temperature 97.9 F (36.6 C), temperature source Oral, resp. rate 20, weight 324 lb (146.965 kg), SpO2 97 %. General appearance: alert, cooperative, appears stated age, no distress and morbidly obese. Assisted onto exam table. Head: Normocephalic, without obvious abnormality, atraumatic Eyes: conjunctivae/corneas clear. PERRL, EOM's intact. Fundi benign. Throat: lips, mucosa, and tongue normal; teeth and gums normal Neck: no adenopathy, supple, symmetrical, trachea midline and thyroid not enlarged, symmetric, no tenderness/mass/nodules Back: symmetric, no curvature. ROM normal. No CVA tenderness. Lungs: clear to auscultation bilaterally and normal  percussion bilaterally Heart: regular rate and rhythm, S1, S2 normal, no murmur, click, rub or gallop Abdomen: soft, non-tender; bowel sounds normal; no masses,  no organomegaly Extremities: extremities normal, atraumatic, no cyanosis edema at the ankles bilaterally. Right lower extremity greater than left lower extremity Skin: Skin color, texture, turgor normal. No rashes or lesions Lymph nodes: Cervical, supraclavicular, and axillary nodes normal. Neurologic: Alert and oriented X 3, normal strength and tone. Normal symmetric reflexes. Normal coordination and gait  LABORATORY DATA:  I have reviewed the data as listed.  Bone Marrow BiopsyS Diagnosis Bone Marrow, Aspirate,Biopsy, and Clot - NORMOCELLULAR BONE MARROW FOR AGE WITH PLASMA CELL NEOPLASM. - TRILINEAGE HEMATOPOIESIS. - SEE COMMENT. PERIPHERAL BLOOD: - NORMOCYTIC-NORMOCHROMIC ANEMIA - LEUKOPENIA Diagnosis  Note Despite limited aspirate material, the plasma cell component is increased in the marrow representing an estimated 18% of all cells with associated atypical cytomorphologic features. Immunohistochemical stains show the the plasma cells are kappa light chain-restricted consistent with plasma cell neoplasm. The background shows trilineage hematopoiesis with non specific changes. Correlation with cytogenetic and FISH studies is recommended. (BNS:ds/gt, 09/04/14) Susanne Greenhouse MD Pathologist, Electronic Signature (Case signed 09/07/2014)   RADIOGRAPHY: I have reviewed the images below and agree with the results. CLINICAL DATA: Normocytic hypochromic anemia. Leukopenia.  EXAM: METASTATIC BONE SURVEY  COMPARISON: None.  FINDINGS: Multilevel degenerative disc disease is noted in the cervical and thoracic spine. Severe degenerative disc disease is noted in both knees. No areas of abnormal sclerosis or lytic destruction are seen in the visualized skeleton.  IMPRESSION: No lytic lesions are noted in the  visualized skeleton.   Electronically Signed  By: Marijo Conception, M.D.  On: 08/07/2014 12:33  PATHOLOGY:  None   ASSESSMENT/PLAN:  Multiple Myeloma IgG kappa, STAGE II, serum albumin 3.5 g/dl IgG of 04/14/2007 MG/DL, total M spike of 2.7 g/DL Beta-2 microglobulin of 3.7 MG/L Severe Anemia at presentation BMBX with 18% monoclonal plasma cells, kappa light chain restsricted. 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 Based upon staging, has stage II disease. Based upon cytogenetics has high risk disease.  Kelli Hurley is currently on RVD. We dose reduced Kelli Hurley Velcade because of lower extremity edema that was significant enough Kelli Hurley went to the emergency department.   Kelli Hurley is doing well. Kelli Hurley is working with Dr. Luan Pulling in regards to Kelli Hurley exertional hypoxemia. Kelli Hurley has been diagnosed with asthma. Kelli Hurley is doing much better.  I have discussed with the patient that while on zometa Kelli Hurley cannot have any major dental work done. We discussed the risks and benefits of Zometa therapy, I explained to the patient that even though we do not have evidence of lytic disease that Zometa is indicated in all patients with multiple myeloma. Kelli Hurley does receive dental care. We discussed the risk of osteonecrosis of the jaw with certain dental procedures.  Kelli Hurley has missed appointments with me however has been very compliant with therapy. I am going to refer Kelli Hurley to [facility] for consideration of transplant. Kelli Hurley had exertional hypoxemia at diagnosis, this is markedly improved since Kelli Hurley has been working with Dr. Luan Pulling.   Based on Kelli Hurley recent blood results, I have  advised the patient to increase Kelli Hurley current potassium dose to twice daily from previous once daily.  I will see Kelli Hurley back prior to Kelli Hurley next cycle with labs.  All questions were answered. The patient knows to call the clinic with any problems, questions or concerns. We can certainly see the patient much sooner if necessary.   This document serves as a record of services personally performed by Ancil Linsey, MD. It was created on Kelli Hurley behalf by Arlyce Harman, a trained medical scribe. The creation of this record is based on the scribe's personal observations and the provider's statements to them. This document has been checked and approved by the attending provider.  I have reviewed the above documentation for accuracy and completeness, and I agree with the above.  This note is electronically signed.  Kelby Fam. Whitney Muse, MD

## 2015-03-08 NOTE — Patient Instructions (Signed)
..  [facility] Discharge Instructions for Patients Receiving Chemotherapy   Beginning January 23rd 2017 lab work for the [facility] will be done in the  Main lab at [facility] on 1st floor. If you have a lab appointment with the [place] please come in thru the  Main Entrance and check in at the main information desk   Today you received the following chemotherapy agents velcade  If you develop nausea and vomiting, or diarrhea that is not controlled by your medication, call the clinic.  The clinic phone number is [phone]. Office hours are Monday-Friday 8:30am-5:00pm.  BELOW ARE SYMPTOMS THAT SHOULD BE REPORTED IMMEDIATELY:  *FEVER GREATER THAN 101.0 F  *CHILLS WITH OR WITHOUT FEVER  NAUSEA AND VOMITING THAT IS NOT CONTROLLED WITH YOUR NAUSEA MEDICATION  *UNUSUAL SHORTNESS OF BREATH  *UNUSUAL BRUISING OR BLEEDING  TENDERNESS IN MOUTH AND THROAT WITH OR WITHOUT PRESENCE OF ULCERS  *URINARY PROBLEMS  *BOWEL PROBLEMS  UNUSUAL RASH Items with * indicate a potential emergency and should be followed up as soon as possible. If you have an emergency after office hours please contact your primary care physician or go to the nearest emergency department.  Please call the clinic during office hours if you have any questions or concerns.   You may also contact the Patient Navigator at [phone] should you have any questions or need assistance in obtaining follow up care.

## 2015-03-08 NOTE — Progress Notes (Signed)
Referral sent to [facility]/ Dr Elizbeth Squires.  Appt 2/15 @915 .  Patient aware of appt.  Records sent on 1/30

## 2015-03-08 NOTE — Patient Instructions (Addendum)
[place] at [facility] Discharge Instructions  RECOMMENDATIONS MADE BY THE CONSULTANT AND ANY TEST RESULTS WILL BE SENT TO YOUR REFERRING PHYSICIAN.     Exam and discussion by Dr Whitney Muse today Potassium increase to twice a day.  Refill sent to your pharmacy. Referral to [facility]. They will be calling you with this appt. Please call us in one week if they have not contacted you Velcade today as scheduled Revlimid as scheduled Return to see the doctor in 1 month Please call the clinic if you have any questions or concerns    Thank you for choosing [place] at [facility] to provide your oncology and hematology care.  To afford each patient quality time with our provider, please arrive at least 15 minutes before your scheduled appointment time.   Beginning January 23rd 2017 lab work for the [name] will be done in the  Main lab at [facility] on 1st floor. If you have a lab appointment with the [place] please come in thru the  Main Entrance and check in at the main information desk  You need to re-schedule your appointment should you arrive 10 or more minutes late.  We strive to give you quality time with our providers, and arriving late affects you and other patients whose appointments are after yours.  Also, if you no show three or more times for appointments you may be dismissed from the clinic at the providers discretion.     Again, thank you for choosing [facility].  Our hope is that these requests will decrease the amount of time that you wait before being seen by our physicians.       _____________________________________________________________  Should you have questions after your visit to [facility], please contact our office at [phone] between the hours of 8:30 a.m. and 4:30 p.m.  Voicemails left after 4:30 p.m. will not be returned until the following business day.  For prescription  refill requests, have your pharmacy contact our office.

## 2015-03-08 NOTE — Progress Notes (Signed)
..  Kelli Hurley here for zofran and Subcutaneous velcade today during MD exam. injection per the provider's orders.  Velcade administration without incident; see MAR for injection details.  Patient tolerated procedure well and without incident.  No questions or complaints noted at this time. See MD exam encounter for assessment.

## 2015-03-13 ENCOUNTER — Other Ambulatory Visit (HOSPITAL_COMMUNITY): Payer: Self-pay | Admitting: Oncology

## 2015-03-15 ENCOUNTER — Encounter (HOSPITAL_COMMUNITY): Payer: Medicare Other | Attending: Oncology

## 2015-03-15 VITALS — BP 144/55 | HR 91 | Temp 98.1°F | Resp 22 | Wt 320.4 lb

## 2015-03-15 DIAGNOSIS — D509 Iron deficiency anemia, unspecified: Secondary | ICD-10-CM | POA: Insufficient documentation

## 2015-03-15 DIAGNOSIS — Z5112 Encounter for antineoplastic immunotherapy: Secondary | ICD-10-CM | POA: Diagnosis present

## 2015-03-15 DIAGNOSIS — C9000 Multiple myeloma not having achieved remission: Secondary | ICD-10-CM | POA: Diagnosis not present

## 2015-03-15 DIAGNOSIS — D72819 Decreased white blood cell count, unspecified: Secondary | ICD-10-CM | POA: Diagnosis not present

## 2015-03-15 LAB — COMPREHENSIVE METABOLIC PANEL
ALBUMIN: 3.8 g/dL (ref 3.5–5.0)
ALT: 12 U/L — ABNORMAL LOW (ref 14–54)
ANION GAP: 9 (ref 5–15)
AST: 14 U/L — AB (ref 15–41)
Alkaline Phosphatase: 42 U/L (ref 38–126)
BILIRUBIN TOTAL: 0.7 mg/dL (ref 0.3–1.2)
BUN: 13 mg/dL (ref 6–20)
CHLORIDE: 106 mmol/L (ref 101–111)
CO2: 24 mmol/L (ref 22–32)
Calcium: 9.1 mg/dL (ref 8.9–10.3)
Creatinine, Ser: 0.91 mg/dL (ref 0.44–1.00)
GFR calc Af Amer: >60 mL/min (ref >60)
GFR calc non Af Amer: >60 mL/min (ref >60)
GLUCOSE: 104 mg/dL — AB (ref 65–99)
POTASSIUM: 4.1 mmol/L (ref 3.5–5.1)
SODIUM: 139 mmol/L (ref 135–145)
TOTAL PROTEIN: 6.8 g/dL (ref 6.5–8.1)

## 2015-03-15 LAB — CBC WITH DIFFERENTIAL/PLATELET
BASOS ABS: 0 10*3/uL (ref 0.0–0.1)
BASOS PCT: 0 %
EOS ABS: 0 10*3/uL (ref 0.0–0.7)
EOS PCT: 1 %
HCT: 35.7 % — ABNORMAL LOW (ref 36.0–46.0)
Hemoglobin: 11.2 g/dL — ABNORMAL LOW (ref 12.0–15.0)
Lymphocytes Relative: 9 %
Lymphs Abs: 0.5 10*3/uL — ABNORMAL LOW (ref 0.7–4.0)
MCH: 26 pg (ref 26.0–34.0)
MCHC: 31.4 g/dL (ref 30.0–36.0)
MCV: 82.8 fL (ref 78.0–100.0)
MONO ABS: 0.1 10*3/uL (ref 0.1–1.0)
MONOS PCT: 3 %
NEUTROS ABS: 4.7 10*3/uL (ref 1.7–7.7)
Neutrophils Relative %: 87 %
PLATELETS: 217 10*3/uL (ref 150–400)
RBC: 4.31 MIL/uL (ref 3.87–5.11)
RDW: 16.5 % — AB (ref 11.5–15.5)
WBC: 5.4 10*3/uL (ref 4.0–10.5)

## 2015-03-15 LAB — SEDIMENTATION RATE: Sed Rate: 60 mm/hr — ABNORMAL HIGH (ref 0–22)

## 2015-03-15 LAB — C-REACTIVE PROTEIN: CRP: 2.6 mg/dL — AB (ref <1.0)

## 2015-03-15 LAB — LACTATE DEHYDROGENASE: LDH: 205 U/L — ABNORMAL HIGH (ref 98–192)

## 2015-03-15 MED ORDER — BORTEZOMIB CHEMO SQ INJECTION 3.5 MG (2.5MG/ML)
1.3000 mg/m2 | Freq: Once | INTRAMUSCULAR | Status: AC
  Administered 2015-03-15: 3.25 mg via SUBCUTANEOUS
  Filled 2015-03-15: qty 3.25

## 2015-03-15 MED ORDER — ONDANSETRON HCL 4 MG PO TABS
8.0000 mg | ORAL_TABLET | Freq: Once | ORAL | Status: AC
  Administered 2015-03-15: 8 mg via ORAL
  Filled 2015-03-15: qty 2

## 2015-03-15 NOTE — Progress Notes (Signed)
Corrin Parker presented for labwork. Labs per MD order drawn via Peripheral Line 25 gauge needle inserted in RAC.  Procedure without incident.  Patient tolerated procedure well.  Velcade given SQ in right abdomen.   Patient tolerated well.

## 2015-03-15 NOTE — Patient Instructions (Signed)
[facility] Discharge Instructions for Patients Receiving Chemotherapy   Beginning January 23rd 2017 lab work for the [facility] will be done in the  Main lab at [facility] on 1st floor. If you have a lab appointment with the [place] please come in thru the  Main Entrance and check in at the main information desk   Today you received the following chemotherapy agent: Velcade.   Please return as scheduled.   If you develop nausea and vomiting, or diarrhea that is not controlled by your medication, call the clinic.  The clinic phone number is [phone]. Office hours are Monday-Friday 8:30am-5:00pm.  BELOW ARE SYMPTOMS THAT SHOULD BE REPORTED IMMEDIATELY:  *FEVER GREATER THAN 101.0 F  *CHILLS WITH OR WITHOUT FEVER  NAUSEA AND VOMITING THAT IS NOT CONTROLLED WITH YOUR NAUSEA MEDICATION  *UNUSUAL SHORTNESS OF BREATH  *UNUSUAL BRUISING OR BLEEDING  TENDERNESS IN MOUTH AND THROAT WITH OR WITHOUT PRESENCE OF ULCERS  *URINARY PROBLEMS  *BOWEL PROBLEMS  UNUSUAL RASH Items with * indicate a potential emergency and should be followed up as soon as possible. If you have an emergency after office hours please contact your primary care physician or go to the nearest emergency department.  Please call the clinic during office hours if you have any questions or concerns.   You may also contact the Patient Navigator at [phone] should you have any questions or need assistance in obtaining follow up care.

## 2015-03-16 LAB — IMMUNOFIXATION ELECTROPHORESIS
IGA: 90 mg/dL (ref 87–352)
IgG (Immunoglobin G), Serum: 920 mg/dL (ref 700–1600)
IgM, Serum: 81 mg/dL (ref 26–217)
Total Protein ELP: 6.7 g/dL (ref 6.0–8.5)

## 2015-03-16 LAB — KAPPA/LAMBDA LIGHT CHAINS
Kappa free light chain: 28.31 mg/L — ABNORMAL HIGH (ref 3.30–19.40)
Kappa, lambda light chain ratio: 1.61 (ref 0.26–1.65)
Lambda free light chains: 17.54 mg/L (ref 5.71–26.30)

## 2015-03-16 LAB — PROTEIN ELECTROPHORESIS, SERUM
A/G RATIO SPE: 1.1 (ref 0.7–1.7)
ALBUMIN ELP: 3.5 g/dL (ref 2.9–4.4)
ALPHA-1-GLOBULIN: 0.3 g/dL (ref 0.0–0.4)
ALPHA-2-GLOBULIN: 0.8 g/dL (ref 0.4–1.0)
Beta Globulin: 1 g/dL (ref 0.7–1.3)
GLOBULIN, TOTAL: 3.1 g/dL (ref 2.2–3.9)
Gamma Globulin: 1.1 g/dL (ref 0.4–1.8)
M-Spike, %: 0.5 g/dL — ABNORMAL HIGH
Total Protein ELP: 6.6 g/dL (ref 6.0–8.5)

## 2015-03-16 LAB — IGG, IGA, IGM
IGM, SERUM: 85 mg/dL (ref 26–217)
IgA: 96 mg/dL (ref 87–352)
IgG (Immunoglobin G), Serum: 1042 mg/dL (ref 700–1600)

## 2015-03-16 LAB — BETA 2 MICROGLOBULIN, SERUM: BETA 2 MICROGLOBULIN: 3.2 mg/L — AB (ref 0.6–2.4)

## 2015-03-17 DIAGNOSIS — D72822 Plasmacytosis: Secondary | ICD-10-CM | POA: Diagnosis not present

## 2015-03-22 ENCOUNTER — Encounter (HOSPITAL_BASED_OUTPATIENT_CLINIC_OR_DEPARTMENT_OTHER): Payer: Medicare Other

## 2015-03-22 ENCOUNTER — Other Ambulatory Visit (HOSPITAL_COMMUNITY): Payer: Self-pay | Admitting: Oncology

## 2015-03-22 ENCOUNTER — Encounter (HOSPITAL_COMMUNITY): Payer: Medicare Other

## 2015-03-22 VITALS — BP 115/63 | HR 82 | Temp 98.0°F | Resp 20

## 2015-03-22 DIAGNOSIS — C9000 Multiple myeloma not having achieved remission: Secondary | ICD-10-CM

## 2015-03-22 DIAGNOSIS — D72819 Decreased white blood cell count, unspecified: Secondary | ICD-10-CM | POA: Diagnosis not present

## 2015-03-22 DIAGNOSIS — J069 Acute upper respiratory infection, unspecified: Secondary | ICD-10-CM

## 2015-03-22 DIAGNOSIS — D509 Iron deficiency anemia, unspecified: Secondary | ICD-10-CM | POA: Diagnosis not present

## 2015-03-22 DIAGNOSIS — Z5112 Encounter for antineoplastic immunotherapy: Secondary | ICD-10-CM | POA: Diagnosis present

## 2015-03-22 LAB — CBC WITH DIFFERENTIAL/PLATELET
Basophils Absolute: 0 10*3/uL (ref 0.0–0.1)
Basophils Relative: 0 %
EOS ABS: 0.1 10*3/uL (ref 0.0–0.7)
EOS PCT: 1 %
HCT: 34.4 % — ABNORMAL LOW (ref 36.0–46.0)
Hemoglobin: 11 g/dL — ABNORMAL LOW (ref 12.0–15.0)
LYMPHS ABS: 0.5 10*3/uL — AB (ref 0.7–4.0)
LYMPHS PCT: 8 %
MCH: 26.4 pg (ref 26.0–34.0)
MCHC: 32 g/dL (ref 30.0–36.0)
MCV: 82.5 fL (ref 78.0–100.0)
MONO ABS: 0.1 10*3/uL (ref 0.1–1.0)
MONOS PCT: 1 %
Neutro Abs: 6 10*3/uL (ref 1.7–7.7)
Neutrophils Relative %: 90 %
PLATELETS: 234 10*3/uL (ref 150–400)
RBC: 4.17 MIL/uL (ref 3.87–5.11)
RDW: 16.7 % — AB (ref 11.5–15.5)
WBC: 6.7 10*3/uL (ref 4.0–10.5)

## 2015-03-22 LAB — COMPREHENSIVE METABOLIC PANEL
ALT: 12 U/L — AB (ref 14–54)
ANION GAP: 10 (ref 5–15)
AST: 17 U/L (ref 15–41)
Albumin: 3.9 g/dL (ref 3.5–5.0)
Alkaline Phosphatase: 43 U/L (ref 38–126)
BUN: 14 mg/dL (ref 6–20)
CALCIUM: 9.4 mg/dL (ref 8.9–10.3)
CHLORIDE: 102 mmol/L (ref 101–111)
CO2: 27 mmol/L (ref 22–32)
CREATININE: 0.93 mg/dL (ref 0.44–1.00)
Glucose, Bld: 122 mg/dL — ABNORMAL HIGH (ref 65–99)
Potassium: 3.7 mmol/L (ref 3.5–5.1)
SODIUM: 139 mmol/L (ref 135–145)
Total Bilirubin: 0.5 mg/dL (ref 0.3–1.2)
Total Protein: 7.4 g/dL (ref 6.5–8.1)

## 2015-03-22 MED ORDER — ONDANSETRON HCL 4 MG PO TABS
8.0000 mg | ORAL_TABLET | Freq: Once | ORAL | Status: AC
  Administered 2015-03-22: 8 mg via ORAL

## 2015-03-22 MED ORDER — BORTEZOMIB CHEMO SQ INJECTION 3.5 MG (2.5MG/ML)
1.3000 mg/m2 | Freq: Once | INTRAMUSCULAR | Status: AC
  Administered 2015-03-22: 3.25 mg via SUBCUTANEOUS
  Filled 2015-03-22: qty 3.25

## 2015-03-22 MED ORDER — ONDANSETRON HCL 4 MG PO TABS
ORAL_TABLET | ORAL | Status: AC
  Filled 2015-03-22: qty 2

## 2015-03-22 MED ORDER — SODIUM CHLORIDE 0.9 % IV SOLN
Freq: Once | INTRAVENOUS | Status: AC
  Administered 2015-03-22: 13:00:00 via INTRAVENOUS

## 2015-03-22 MED ORDER — ZOLEDRONIC ACID 4 MG/5ML IV CONC
4.0000 mg | Freq: Once | INTRAVENOUS | Status: AC
  Administered 2015-03-22: 4 mg via INTRAVENOUS
  Filled 2015-03-22: qty 5

## 2015-03-22 MED ORDER — BENZONATATE 200 MG PO CAPS: 200.0000 mg | ORAL_CAPSULE | Freq: Three times a day (TID) | ORAL | Status: DC | PRN

## 2015-03-22 NOTE — Progress Notes (Signed)
Please see other encounter for documentation.   

## 2015-03-22 NOTE — Progress Notes (Signed)
Corrin Parker presents today for injection per MD orders. Velcade administered SQ in left Abdomen. Administration without incident. Patient tolerated well.  Patient also tolerated Zometa infusion well.  VSS.

## 2015-03-22 NOTE — Patient Instructions (Signed)
[facility] Discharge Instructions for Patients Receiving Chemotherapy   Beginning January 23rd 2017 lab work for the [facility] will be done in the  Main lab at [facility] on 1st floor. If you have a lab appointment with the [place] please come in thru the  Main Entrance and check in at the main information desk   Today you received the following chemotherapy agent: Velcade. You also received Zometa today.     If you develop nausea and vomiting, or diarrhea that is not controlled by your medication, call the clinic.  The clinic phone number is [phone]. Office hours are Monday-Friday 8:30am-5:00pm.  BELOW ARE SYMPTOMS THAT SHOULD BE REPORTED IMMEDIATELY:  *FEVER GREATER THAN 101.0 F  *CHILLS WITH OR WITHOUT FEVER  NAUSEA AND VOMITING THAT IS NOT CONTROLLED WITH YOUR NAUSEA MEDICATION  *UNUSUAL SHORTNESS OF BREATH  *UNUSUAL BRUISING OR BLEEDING  TENDERNESS IN MOUTH AND THROAT WITH OR WITHOUT PRESENCE OF ULCERS  *URINARY PROBLEMS  *BOWEL PROBLEMS  UNUSUAL RASH Items with * indicate a potential emergency and should be followed up as soon as possible. If you have an emergency after office hours please contact your primary care physician or go to the nearest emergency department.  Please call the clinic during office hours if you have any questions or concerns.   You may also contact the Patient Navigator at [phone] should you have any questions or need assistance in obtaining follow up care.

## 2015-03-24 DIAGNOSIS — C9000 Multiple myeloma not having achieved remission: Secondary | ICD-10-CM | POA: Diagnosis not present

## 2015-03-25 DIAGNOSIS — I10 Essential (primary) hypertension: Secondary | ICD-10-CM | POA: Diagnosis not present

## 2015-03-25 DIAGNOSIS — J4530 Mild persistent asthma, uncomplicated: Secondary | ICD-10-CM | POA: Diagnosis not present

## 2015-03-25 DIAGNOSIS — M170 Bilateral primary osteoarthritis of knee: Secondary | ICD-10-CM | POA: Diagnosis not present

## 2015-03-25 DIAGNOSIS — Z713 Dietary counseling and surveillance: Secondary | ICD-10-CM | POA: Diagnosis not present

## 2015-03-29 ENCOUNTER — Inpatient Hospital Stay (HOSPITAL_COMMUNITY): Payer: Medicare Other

## 2015-03-29 ENCOUNTER — Other Ambulatory Visit (HOSPITAL_COMMUNITY): Payer: Medicare Other

## 2015-04-07 ENCOUNTER — Ambulatory Visit (HOSPITAL_COMMUNITY): Payer: Medicare Other | Admitting: Hematology & Oncology

## 2015-04-07 DIAGNOSIS — Z136 Encounter for screening for cardiovascular disorders: Secondary | ICD-10-CM | POA: Diagnosis not present

## 2015-04-07 DIAGNOSIS — Z01818 Encounter for other preprocedural examination: Secondary | ICD-10-CM | POA: Diagnosis not present

## 2015-04-07 DIAGNOSIS — C9000 Multiple myeloma not having achieved remission: Secondary | ICD-10-CM | POA: Diagnosis not present

## 2015-04-07 DIAGNOSIS — I440 Atrioventricular block, first degree: Secondary | ICD-10-CM | POA: Diagnosis not present

## 2015-04-07 DIAGNOSIS — Z01810 Encounter for preprocedural cardiovascular examination: Secondary | ICD-10-CM | POA: Diagnosis not present

## 2015-04-07 NOTE — Progress Notes (Signed)
This encounter was created in error - please disregard.

## 2015-04-08 ENCOUNTER — Other Ambulatory Visit (HOSPITAL_COMMUNITY): Payer: Self-pay | Admitting: Oncology

## 2015-04-08 DIAGNOSIS — C9000 Multiple myeloma not having achieved remission: Secondary | ICD-10-CM

## 2015-04-12 ENCOUNTER — Other Ambulatory Visit (HOSPITAL_COMMUNITY): Payer: Medicare Other

## 2015-04-12 ENCOUNTER — Encounter (HOSPITAL_COMMUNITY): Payer: Medicare Other | Attending: Oncology

## 2015-04-12 DIAGNOSIS — C9000 Multiple myeloma not having achieved remission: Secondary | ICD-10-CM

## 2015-04-12 DIAGNOSIS — D509 Iron deficiency anemia, unspecified: Secondary | ICD-10-CM | POA: Diagnosis not present

## 2015-04-12 DIAGNOSIS — D72819 Decreased white blood cell count, unspecified: Secondary | ICD-10-CM | POA: Insufficient documentation

## 2015-04-12 LAB — CBC WITH DIFFERENTIAL/PLATELET
Basophils Absolute: 0 10*3/uL (ref 0.0–0.1)
Basophils Relative: 1 %
EOS PCT: 3 %
Eosinophils Absolute: 0.1 10*3/uL (ref 0.0–0.7)
HCT: 34.5 % — ABNORMAL LOW (ref 36.0–46.0)
Hemoglobin: 11 g/dL — ABNORMAL LOW (ref 12.0–15.0)
LYMPHS ABS: 1.1 10*3/uL (ref 0.7–4.0)
LYMPHS PCT: 25 %
MCH: 26.9 pg (ref 26.0–34.0)
MCHC: 31.9 g/dL (ref 30.0–36.0)
MCV: 84.4 fL (ref 78.0–100.0)
MONO ABS: 0.3 10*3/uL (ref 0.1–1.0)
Monocytes Relative: 6 %
Neutro Abs: 2.8 10*3/uL (ref 1.7–7.7)
Neutrophils Relative %: 65 %
PLATELETS: 266 10*3/uL (ref 150–400)
RBC: 4.09 MIL/uL (ref 3.87–5.11)
RDW: 16.1 % — AB (ref 11.5–15.5)
WBC: 4.4 10*3/uL (ref 4.0–10.5)

## 2015-04-12 LAB — COMPREHENSIVE METABOLIC PANEL
ALT: 11 U/L — AB (ref 14–54)
AST: 18 U/L (ref 15–41)
Albumin: 3.6 g/dL (ref 3.5–5.0)
Alkaline Phosphatase: 43 U/L (ref 38–126)
Anion gap: 6 (ref 5–15)
BILIRUBIN TOTAL: 0.5 mg/dL (ref 0.3–1.2)
BUN: 14 mg/dL (ref 6–20)
CHLORIDE: 104 mmol/L (ref 101–111)
CO2: 30 mmol/L (ref 22–32)
CREATININE: 0.83 mg/dL (ref 0.44–1.00)
Calcium: 9 mg/dL (ref 8.9–10.3)
Glucose, Bld: 89 mg/dL (ref 65–99)
Potassium: 3.1 mmol/L — ABNORMAL LOW (ref 3.5–5.1)
Sodium: 140 mmol/L (ref 135–145)
TOTAL PROTEIN: 7.2 g/dL (ref 6.5–8.1)

## 2015-04-12 LAB — CREATININE CLEARANCE, URINE, 24 HOUR
CREAT CLEAR: 122 mL/min — AB (ref 75–115)
CREATININE 24H UR: 1453 mg/d (ref 600–1800)
CREATININE, URINE: 149.07 mg/dL
Collection Interval-CRCL: 24 hours
Urine Total Volume-CRCL: 975 mL

## 2015-04-12 LAB — PROTEIN, URINE, 24 HOUR
Collection Interval-UPROT: 24 hours
Protein, 24H Urine: 59 mg/d (ref 50–100)
Protein, Urine: 6 mg/dL
URINE TOTAL VOLUME-UPROT: 975 mL

## 2015-04-13 LAB — UIFE/LIGHT CHAINS/TP QN, 24-HR UR
% BETA, URINE: 26.6 %
ALBUMIN, U: 23 %
ALPHA 1 URINE: 7.8 %
Alpha 2, Urine: 19.9 %
FREE LAMBDA LT CHAINS, UR: 2.04 mg/L (ref 0.24–6.66)
FREE LT CHN EXCR RATE: 31.5 mg/L — AB (ref 1.35–24.19)
Free Kappa/Lambda Ratio: 15.44 — ABNORMAL HIGH (ref 2.04–10.37)
GAMMA GLOBULIN URINE: 22.7 %
TOTAL PROTEIN, URINE-UPE24: 7.9 mg/dL
TOTAL PROTEIN, URINE-UR/DAY: 77 mg/(24.h) (ref 30.0–150.0)
Time: 24 hours
Volume, Urine: 975 mL

## 2015-04-14 DIAGNOSIS — Z01818 Encounter for other preprocedural examination: Secondary | ICD-10-CM | POA: Diagnosis not present

## 2015-04-14 DIAGNOSIS — C9000 Multiple myeloma not having achieved remission: Secondary | ICD-10-CM | POA: Diagnosis not present

## 2015-04-14 DIAGNOSIS — Z79899 Other long term (current) drug therapy: Secondary | ICD-10-CM | POA: Diagnosis not present

## 2015-04-21 DIAGNOSIS — Z7982 Long term (current) use of aspirin: Secondary | ICD-10-CM | POA: Diagnosis not present

## 2015-04-21 DIAGNOSIS — Z9221 Personal history of antineoplastic chemotherapy: Secondary | ICD-10-CM | POA: Diagnosis not present

## 2015-04-21 DIAGNOSIS — C9000 Multiple myeloma not having achieved remission: Secondary | ICD-10-CM | POA: Diagnosis not present

## 2015-04-21 DIAGNOSIS — Z90710 Acquired absence of both cervix and uterus: Secondary | ICD-10-CM | POA: Diagnosis not present

## 2015-04-21 DIAGNOSIS — I10 Essential (primary) hypertension: Secondary | ICD-10-CM | POA: Diagnosis not present

## 2015-04-22 ENCOUNTER — Encounter (HOSPITAL_COMMUNITY): Payer: Self-pay | Admitting: Hematology & Oncology

## 2015-04-22 ENCOUNTER — Encounter (HOSPITAL_BASED_OUTPATIENT_CLINIC_OR_DEPARTMENT_OTHER): Payer: Medicare Other | Admitting: Hematology & Oncology

## 2015-04-22 VITALS — BP 156/87 | HR 96 | Temp 97.9°F | Wt 324.0 lb

## 2015-04-22 DIAGNOSIS — C9000 Multiple myeloma not having achieved remission: Secondary | ICD-10-CM | POA: Diagnosis not present

## 2015-04-22 DIAGNOSIS — R0600 Dyspnea, unspecified: Secondary | ICD-10-CM

## 2015-04-22 DIAGNOSIS — E669 Obesity, unspecified: Secondary | ICD-10-CM

## 2015-04-22 DIAGNOSIS — C9001 Multiple myeloma in remission: Secondary | ICD-10-CM

## 2015-04-22 DIAGNOSIS — D649 Anemia, unspecified: Secondary | ICD-10-CM

## 2015-04-22 NOTE — Patient Instructions (Addendum)
[place] at [facility] Discharge Instructions  RECOMMENDATIONS MADE BY THE CONSULTANT AND ANY TEST RESULTS WILL BE SENT TO YOUR REFERRING PHYSICIAN.   Exam and discussion by Dr Whitney Muse today Transplant on 05/05/2015 Once we get the fax from [facility] about your catheter we will call you to set up those appointments  Return to see the doctor at the end of april Please call the clinic if you have any questions or concerns    Thank you for choosing [place] at [facility] to provide your oncology and hematology care.  To afford each patient quality time with our provider, please arrive at least 15 minutes before your scheduled appointment time.   Beginning January 23rd 2017 lab work for the [name] will be done in the  Main lab at [facility] on 1st floor. If you have a lab appointment with the [facility] please come in thru the  Main Entrance and check in at the main information desk  You need to re-schedule your appointment should you arrive 10 or more minutes late.  We strive to give you quality time with our providers, and arriving late affects you and other patients whose appointments are after yours.  Also, if you no show three or more times for appointments you may be dismissed from the clinic at the providers discretion.     Again, thank you for choosing [facility].  Our hope is that these requests will decrease the amount of time that you wait before being seen by our physicians.       _____________________________________________________________  Should you have questions after your visit to [facility], please contact our office at [phone] between the hours of 8:30 a.m. and 4:30 p.m.  Voicemails left after 4:30 p.m. will not be returned until the following business day.  For prescription refill requests, have your pharmacy contact our office.         Resources For Cancer Patients and their  Caregivers ? American Cancer Society: Can assist with transportation, wigs, general needs, runs Look Good Feel Better.        [phone] ? Cancer Care: Provides financial assistance, online support groups, medication/co-pay assistance.  1-800-813-HOPE (808) 483-9558) ? [place] Assists [place] cancer patients and their families through emotional , educational and financial support.  651-585-3480 ? [name] Where to apply for food stamps, Medicaid and utility assistance. (906)278-4117 ? [name]: Transportation to medical appointments. (928)716-5414 ? Social Security Administration: May apply for disability if have a Stage IV cancer. 925-325-3214 780-307-9161 ? [name], Disability and Transit Services: Assists with nutrition, care and transit needs. [phone]

## 2015-04-22 NOTE — Progress Notes (Signed)
[facility] Hematology/Oncology Progress Note   Name: Kelli Hurley      MRN: [id number]    Date: 04/22/2015 Time:10:39 AM   REFERRING PHYSICIAN:  Dr. Charlotte Sanes   DIAGNOSIS:  Multiple Myeloma IgG kappa, STAGE II, serum albumin 3.5 g/dl  HISTORY OF PRESENT ILLNESS:   Kelli Hurley is a 60 year old black American woman with a past medical history significant for HTN and obesity who was referred to [facility] for anemia, leukopenia, and cryglobulinemia. She is here for follow-up of Multiple Myeloma.  She has been evaluated at [facility] for transplant. She is following with Reola Calkins MD. Plan is to begin mobilization.   Kelli Hurley was here alone today.  She stated that her appetite and bowels are okay. She has been sleeping okay. She has been walking and has had no SOB. She is very pleased at how much her breathing has improved over the past few months.   She denies diarrhea.   She has an appointment with [facility] for her transplant on the 29th of March. On Tuesday she goes back "to get stem cells collected". She stated that when she went there they did a bone marrow biopsy, breathing and heart tests, and had a PET scan done. They said that everything looked good.   She does not need any refills.   PAST MEDICAL HISTORY:   Past Medical History  Diagnosis Date  . Hypertension   . Anemia   . Normocytic hypochromic anemia 08/03/2014  . Leukopenia 08/03/2014  . Claustrophobia 10/05/2014    ALLERGIES: No Known Allergies    MEDICATIONS: I have reviewed the patient's current medications.    Current Outpatient Prescriptions on File Prior to Visit  Medication Sig Dispense Refill  . albuterol (PROVENTIL HFA;VENTOLIN HFA) 108 (90 BASE) MCG/ACT inhaler Inhale 1 puff into the lungs every 6 (six) hours as needed for wheezing or shortness of breath.    . calcium-vitamin D (OSCAL WITH D) 500-200 MG-UNIT tablet Take 2 tablets by mouth daily with breakfast. 60  tablet 5  . hydrochlorothiazide (HYDRODIURIL) 25 MG tablet Take 25 mg by mouth every morning.    Marland Kitchen ibuprofen (ADVIL,MOTRIN) 200 MG tablet Take 200 mg by mouth every 6 (six) hours as needed.    . potassium chloride SA (K-DUR,KLOR-CON) 20 MEQ tablet Take 1 tablet (20 mEq total) by mouth 2 (two) times daily. 60 tablet 3  . vitamin C (ASCORBIC ACID) 500 MG tablet Take 500 mg by mouth daily.    Marland Kitchen acyclovir (ZOVIRAX) 400 MG tablet TAKE 1 TABLET BY MOUTH TWICE DAILY (Patient not taking: Reported on 04/22/2015) 60 tablet 2  . aspirin 325 MG tablet Take 325 mg by mouth daily. Reported on 04/22/2015    . benzonatate (TESSALON) 200 MG capsule Take 1 capsule (200 mg total) by mouth 3 (three) times daily as needed for cough. (Patient not taking: Reported on 04/22/2015) 30 capsule 1  . Bortezomib (VELCADE IJ) Inject as directed. Reported on 04/22/2015    . dexamethasone (DECADRON) 4 MG tablet TAKE 10 TABLETS BY MOUTH ONCE DAILY ON DAYS 1, 8 AND 15 OF CHEMO. REPEAT EVERY 21 DAYS. (Patient not taking: Reported on 04/22/2015) 30 tablet 1  . diazepam (VALIUM) 5 MG tablet Take 1 tab 1 hour prior to procedure. Then take 1-2 tablets when you arrive if needed. (Patient not taking: Reported on 04/22/2015) 5 tablet 0  . ondansetron (ZOFRAN) 8 MG tablet Take 8 mg by  mouth every 8 (eight) hours as needed for nausea or vomiting. Reported on 04/22/2015    . predniSONE (STERAPRED UNI-PAK 21 TAB) 10 MG (21) TBPK tablet Take 1 tablet (10 mg total) by mouth daily. As directed (Patient not taking: Reported on 04/22/2015) 21 tablet 0  . prochlorperazine (COMPAZINE) 10 MG tablet Take 10 mg by mouth every 6 (six) hours as needed for nausea or vomiting. Reported on 04/22/2015    . REVLIMID 25 MG capsule Take 1 capsule (25 mg total) by mouth daily. Take 14 days on and 7 days off (Patient not taking: Reported on 04/22/2015) 14 capsule 0   Current Facility-Administered Medications on File Prior to Visit  Medication Dose Route Frequency Provider  Last Rate Last Dose  . heparin lock flush 100 unit/mL  500 Units Intravenous Once Patrici Ranks, MD      . sodium chloride 0.9 % injection 10 mL  10 mL Intravenous Once Patrici Ranks, MD         PAST SURGICAL HISTORY Past Surgical History  Procedure Laterality Date  . Abdominal hysterectomy    . Other surgical history      heart surgery as infant to "repair hole in heart"    FAMILY HISTORY: Family History  Problem Relation Age of Onset  . Cancer Mother   . Hypertension Mother   . Cancer Father   . Hypertension Father   . Cancer Maternal Grandmother     SOCIAL HISTORY: She reports that she used to smoke as a teenager, but quit > 40 years ago.  She denies EtOH abuse.  She denies illicit drug abuse.  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  She live byherself.  She is divorced with two daughters ages 24 and 19 who are healthy.  Her grandbaby is healthy too.  She is Psychologist, forensic.  PERFORMANCE STATUS: The patient's performance status is 1 - Symptomatic but completely ambulatory  ROS: Neurological: Negative for neuropathy.  14 point review of systems was performed and is negative except as detailed under history of present illness and above  PHYSICAL EXAM: Most Recent Vital Signs: Blood pressure 156/87, pulse 96, temperature 97.9 F (36.6 C), temperature source Oral, weight 324 lb (146.965 kg), SpO2 96 %. General appearance: alert, cooperative, appears stated age, no distress and morbidly obese. Assisted onto exam table. Head: Normocephalic, without obvious abnormality, atraumatic Eyes: conjunctivae/corneas clear. PERRL, EOM's intact. Fundi benign. Throat: lips, mucosa, and tongue normal; teeth and gums normal Neck: no adenopathy, supple, symmetrical, trachea midline and thyroid not enlarged, symmetric, no tenderness/mass/nodules Back: symmetric, no curvature. ROM normal. No CVA tenderness. Lungs: clear to  auscultation bilaterally and normal percussion bilaterally Heart: regular rate and rhythm, S1, S2 normal, no murmur, click, rub or gallop Abdomen: soft, non-tender; bowel sounds normal; no masses,  no organomegaly Extremities: extremities normal, atraumatic, no cyanosis edema at the ankles bilaterally. Right lower extremity greater than left lower extremity Skin: Skin color, texture, turgor normal. No rashes or lesions Lymph nodes: Cervical, supraclavicular, and axillary nodes normal. Neurologic: Alert and oriented X 3, normal strength and tone. Normal symmetric reflexes. Normal coordination and gait  LABORATORY DATA:  I have reviewed the data as listed. Results for LINDEN, MIKES (MRN [id number]) as of 04/22/2015 09:35  Ref. Range 04/12/2015 08:42  Sodium Latest Ref Range: 135-145 mmol/L 140  Potassium Latest Ref Range: 3.5-5.1 mmol/L 3.1 (L)  Chloride Latest Ref Range:  101-111 mmol/L 104  CO2 Latest Ref Range: 22-32 mmol/L 30  BUN Latest Ref Range: 6-20 mg/dL 14  Creatinine Latest Ref Range: 0.44-1.00 mg/dL 0.83  Calcium Latest Ref Range: 8.9-10.3 mg/dL 9.0  EGFR (Non-African Amer.) Latest Ref Range: >60 mL/min >60  EGFR (African American) Latest Ref Range: >60 mL/min >60  Glucose Latest Ref Range: 65-99 mg/dL 89  Anion gap Latest Ref Range: 5-15  6  Alkaline Phosphatase Latest Ref Range: 38-126 U/L 43  Albumin Latest Ref Range: 3.5-5.0 g/dL 3.6  AST Latest Ref Range: 15-41 U/L 18  ALT Latest Ref Range: 14-54 U/L 11 (L)  Total Protein Latest Ref Range: 6.5-8.1 g/dL 7.2  Total Bilirubin Latest Ref Range: 0.3-1.2 mg/dL 0.5  WBC Latest Ref Range: 4.0-10.5 K/uL 4.4  RBC Latest Ref Range: 3.87-5.11 MIL/uL 4.09  Hemoglobin Latest Ref Range: 12.0-15.0 g/dL 11.0 (L)  HCT Latest Ref Range: 36.0-46.0 % 34.5 (L)  MCV Latest Ref Range: 78.0-100.0 fL 84.4  MCH Latest Ref Range: 26.0-34.0 pg 26.9  MCHC Latest Ref Range: 30.0-36.0 g/dL 31.9  RDW Latest Ref Range: 11.5-15.5 % 16.1 (H)    Platelets Latest Ref Range: 150-400 K/uL 266  Neutrophils Latest Units: % 65  Lymphocytes Latest Units: % 25  Monocytes Relative Latest Units: % 6  Eosinophil Latest Units: % 3  Basophil Latest Units: % 1  NEUT# Latest Ref Range: 1.7-7.7 K/uL 2.8  Lymphocyte # Latest Ref Range: 0.7-4.0 K/uL 1.1  Monocyte # Latest Ref Range: 0.1-1.0 K/uL 0.3  Eosinophils Absolute Latest Ref Range: 0.0-0.7 K/uL 0.1  Basophils Absolute Latest Ref Range: 0.0-0.1 K/uL 0.0   Bone Marrow BiopsyS Diagnosis Bone Marrow, Aspirate,Biopsy, and Clot - NORMOCELLULAR BONE MARROW FOR AGE WITH PLASMA CELL NEOPLASM. - TRILINEAGE HEMATOPOIESIS. - SEE COMMENT. PERIPHERAL BLOOD: - NORMOCYTIC-NORMOCHROMIC ANEMIA - LEUKOPENIA Diagnosis Note Despite limited aspirate material, the plasma cell component is increased in the marrow representing an estimated 18% of all cells with associated atypical cytomorphologic features. Immunohistochemical stains show the the plasma cells are kappa light chain-restricted consistent with plasma cell neoplasm. The background shows trilineage hematopoiesis with non specific changes. Correlation with cytogenetic and FISH studies is recommended. (BNS:ds/gt, 09/04/14) Susanne Greenhouse MD Pathologist, Electronic Signature (Case signed 09/07/2014)   RADIOGRAPHY: Result Impression   No FDG avid osseous or soft tissue lesions are identified. Ancillary CT findings as above.   Result Narrative  PET MULTIPLE MYELOMA (W/LOW DOSE CT), 04/07/2015 10:46 AM  INDICATION: Evaluation for stem cell transplant COMPARISON: Outside facility PET/CT 09/30/2014  TECHNIQUE: 79 minutes after the intravenous injection of 13.218 mCi F-18 FDG, images were obtained from the vertex through the toes. These images were attenuation corrected using CT. Standardized uptake values (SUV) were calculated using a lean body mass algorithm. Blood glucose at the time of injection was 81 mg/dL.  [facility]  and its affiliates are committed to minimizing radiation dose to patients while maintaining necessary diagnostic image quality. All CT scans are therefore performed using "As Low As Reasonably Achievable (ALARA)" protocols with either manual or automated exposure controls calibrated to the age and size of each patient.  LIMITATIONS: The low-dose CT acquisition was performed only for attenuation correction/activity localization. There is no intravenous contrast, further limiting the CT component of the study. Patient body habitus further limits diagnostic quality of the CT portion of the exam. This modality has limited utility for detection or characterization of small lung nodules. Evaluation of the vasculature is limited by lack of IV contrast. Evaluation of  the kidney, ureters, and bladder are limited by urinary excretion of radiotracer. Physiologic bowel uptake of FDG and lack of CT contrast limit evaluation of the bowel.   FINDINGS:   HEAD and NECK:     No abnormal FDG uptake is seen in the face, orbits, sinuses, oral cavity, or thyroid. Ancillary head and neck CT findings: None.              CHEST:     No abnormal FDG uptake is seen in the heart, lungs, pleura, esophagus, hila/mediastinum, axilla, or breasts. Ancillary chest CT findings: None.              ABDOMEN/PELVIS:     No abnormal FDG uptake is seen in the liver, spleen, gallbladder, pancreas, adrenals, peritoneum, extraperitoneum, nodes, or reproductive tract. Ancillary abdomen and pelvis CT findings: None.              MUSCULOSKELETAL:     No abnormal FDG uptake is seen in the soft tissues or bones. Ancillary musculoskeletal CT findings: Bilateral os acromiale. Degenerative changes in the lower back, SI joints, hips, and knees. Calcific tendinitis of the right Achilles tendon.         Expected physiologic activity within the kidneys, ureter, bladder, oropharynx, salivary glands, stomach, bowel and brain.    PATHOLOGY:    ACCESSION NUMBER: [id number] RECEIVED: 04/07/2015 ORDERING PHYSICIAN: CESAR [name] , MD PATIENT NAME: Vela Prose BONE MARROW REPORT   Bone Marrow (BM) and Peripheral Blood (PB) FINAL PATHOLOGIC DIAGNOSIS  BONE MARROW: Normocellular marrow (40%) with approximately 2-4% plasma cells. See comment.  PERIPHERAL BLOOD: Mild normocytic anemia and leucopenia. No rouleaux formation. See CBC data.  COMMENT: The bone marrow aspirate smears are paucispicular, paucicellular and adequate for evaluation. There is no increase in plasma cells (1% by manual differential counts). Blasts are not increased in numbers (less than 1% by manual differential counts). The myeloid and erythroid elements are present in normal proportions with an M: E ratio of 1.8:1. There are no overt dysplastic features of the myeloid or erythroid lineages. Megakaryocytes are quantitatively and qualitatively unremarkable.  Examination of the bone marrow core biopsy is hampered by aspiration artifact, but reveals an overall normocellular marrow for the patient's age (40%). Myeloid and erythroid elements are present in normal proportions. Megakaryocytes are morphologically unremarkable and are present without clustering. There are no atypical lymphoid aggregates or granulomatous formations identified. CD138 immunohistochemistry reveals approximately 2-4% plasma cells. In situ hybridization could not be performed on the core biopsy. No plasma cell clusters are seen. Examination of the clot sections reveals mostly peripheral blood with a single bone marrow spicule.  The peripheral blood smears reveal no rouleaux formation. The peripheral blood leukocytes are morphologically unremarkable.  The positive immunohistochemical controls were performed and worked appropriately.  CYTOGENETICS Date Ordered: 04/07/2015 Date Reported: 04/13/2015 Interpretation  Laboratory Analysis:  GTG-banded Metaphases: #  Cells Karyotyped: Band Resolution:   Molecular Cytogenetic Analysis: (chromosomes pending)   .nuc ish (FGFR3,IgH)x2,(CCND1,IgH)x2,(CEP12,D13S319,LAMP1)x2,(ATM,p53)x2   Molecular Cytogenetic Analysis - FISH: Normal: The investigative technique of molecular-cytogenetic analysis was performed per physician's request using a DNA probe panel specific for regions associated with chromosomal abnormalities of Multiple Myeloma involving loci of chromosomes 4, 11, 12, 13, 14, and 17 on interphase nuclei.  0% of cells showed no fusion, gain/loss of IGH/FGFR3  This result is below the cut-off value of 2% and is technically negative.    ASSESSMENT/PLAN:  Multiple Myeloma IgG kappa, STAGE II, serum albumin 3.5 g/dl IgG of  04/14/2007 MG/DL, total M spike of 2.7 g/DL Beta-2 microglobulin of 3.7 MG/L Severe Anemia at presentation BMBX with 18% monoclonal plasma cells, kappa light chain restsricted. 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 She is preparing for stem cell collection at [facility] and transplant. She obtained a VGPR to initial induction therapy. She did not need any prescription refills today. We have arranged to help with her catheter flush next week prior to her stem cell collection.   PET/CT, PFT's, BMBX, labs and office notes from [facility] were reviewed.   I have tentatively scheduled her for a follow-up on 06/03/15 . I advised her that if she needs follow-up sooner [facility] will contact us and we will get her in sooner.    All questions were answered. The patient knows to call the clinic with any problems, questions or concerns. We can certainly  see the patient much sooner if necessary.   This document serves as a record of services personally performed by Ancil Linsey, MD. It was created on her behalf by Kandace Blitz, a trained medical scribe. The creation of this record is based on the scribe's personal observations and the provider's statements to them. This document has been checked and approved by the attending provider.  I have reviewed the above documentation for accuracy and completeness, and I agree with the above.  This note is electronically signed.  Kelby Fam. Whitney Muse, MD

## 2015-04-23 DIAGNOSIS — C9001 Multiple myeloma in remission: Secondary | ICD-10-CM | POA: Diagnosis not present

## 2015-04-24 DIAGNOSIS — C9001 Multiple myeloma in remission: Secondary | ICD-10-CM | POA: Diagnosis not present

## 2015-04-24 DIAGNOSIS — Z52011 Autologous donor, stem cells: Secondary | ICD-10-CM | POA: Diagnosis not present

## 2015-04-25 DIAGNOSIS — Z52011 Autologous donor, stem cells: Secondary | ICD-10-CM | POA: Diagnosis not present

## 2015-04-25 DIAGNOSIS — C9001 Multiple myeloma in remission: Secondary | ICD-10-CM | POA: Diagnosis not present

## 2015-04-26 DIAGNOSIS — C9001 Multiple myeloma in remission: Secondary | ICD-10-CM | POA: Diagnosis not present

## 2015-04-26 DIAGNOSIS — Z5112 Encounter for antineoplastic immunotherapy: Secondary | ICD-10-CM | POA: Diagnosis not present

## 2015-04-26 DIAGNOSIS — Z52011 Autologous donor, stem cells: Secondary | ICD-10-CM | POA: Diagnosis not present

## 2015-04-27 DIAGNOSIS — Z52011 Autologous donor, stem cells: Secondary | ICD-10-CM | POA: Diagnosis not present

## 2015-04-27 DIAGNOSIS — C9001 Multiple myeloma in remission: Secondary | ICD-10-CM | POA: Diagnosis not present

## 2015-04-28 DIAGNOSIS — Z79899 Other long term (current) drug therapy: Secondary | ICD-10-CM | POA: Diagnosis not present

## 2015-04-28 DIAGNOSIS — C9000 Multiple myeloma not having achieved remission: Secondary | ICD-10-CM | POA: Diagnosis not present

## 2015-04-28 DIAGNOSIS — Z5112 Encounter for antineoplastic immunotherapy: Secondary | ICD-10-CM | POA: Diagnosis not present

## 2015-05-03 ENCOUNTER — Encounter (HOSPITAL_BASED_OUTPATIENT_CLINIC_OR_DEPARTMENT_OTHER): Payer: Medicare Other

## 2015-05-03 ENCOUNTER — Encounter (HOSPITAL_COMMUNITY): Payer: Self-pay

## 2015-05-03 VITALS — BP 158/78 | HR 84 | Temp 98.3°F | Resp 20

## 2015-05-03 DIAGNOSIS — C9000 Multiple myeloma not having achieved remission: Secondary | ICD-10-CM | POA: Diagnosis not present

## 2015-05-03 DIAGNOSIS — Z452 Encounter for adjustment and management of vascular access device: Secondary | ICD-10-CM | POA: Diagnosis not present

## 2015-05-03 MED ORDER — HEPARIN SOD (PORK) LOCK FLUSH 100 UNIT/ML IV SOLN
INTRAVENOUS | Status: AC
  Filled 2015-05-03: qty 10

## 2015-05-03 MED ORDER — HEPARIN SOD (PORK) LOCK FLUSH 100 UNIT/ML IV SOLN
250.0000 [IU] | Freq: Once | INTRAVENOUS | Status: AC
  Administered 2015-05-03: 250 [IU] via INTRAVENOUS

## 2015-05-03 MED ORDER — SODIUM CHLORIDE 0.9% FLUSH
10.0000 mL | Freq: Once | INTRAVENOUS | Status: AC
  Administered 2015-05-03: 10 mL via INTRAVENOUS

## 2015-05-03 NOTE — Patient Instructions (Signed)
Return to [facility] as scheduled

## 2015-05-03 NOTE — Progress Notes (Signed)
Kelli Hurley presented for central line care.  See IV assessment in docflowsheets for CVC details.   CVC located rt chest  Good blood return present. CVC flushed with 167ml NS and 250U Heparin each lumen.Dressing change also.see MAR for further details.  Kelli Hurley tolerated procedure well and without incident.

## 2015-05-05 DIAGNOSIS — C9000 Multiple myeloma not having achieved remission: Secondary | ICD-10-CM | POA: Diagnosis not present

## 2015-05-05 DIAGNOSIS — Z01818 Encounter for other preprocedural examination: Secondary | ICD-10-CM | POA: Diagnosis not present

## 2015-05-06 DIAGNOSIS — C9000 Multiple myeloma not having achieved remission: Secondary | ICD-10-CM | POA: Diagnosis not present

## 2015-05-06 DIAGNOSIS — Z5111 Encounter for antineoplastic chemotherapy: Secondary | ICD-10-CM | POA: Diagnosis not present

## 2015-05-07 DIAGNOSIS — C9000 Multiple myeloma not having achieved remission: Secondary | ICD-10-CM | POA: Diagnosis not present

## 2015-05-07 DIAGNOSIS — Z52011 Autologous donor, stem cells: Secondary | ICD-10-CM | POA: Diagnosis not present

## 2015-05-08 DIAGNOSIS — C9000 Multiple myeloma not having achieved remission: Secondary | ICD-10-CM | POA: Diagnosis not present

## 2015-05-09 DIAGNOSIS — Z9484 Stem cells transplant status: Secondary | ICD-10-CM | POA: Diagnosis not present

## 2015-05-09 DIAGNOSIS — C9000 Multiple myeloma not having achieved remission: Secondary | ICD-10-CM | POA: Diagnosis not present

## 2015-05-10 DIAGNOSIS — C9000 Multiple myeloma not having achieved remission: Secondary | ICD-10-CM | POA: Diagnosis not present

## 2015-05-10 DIAGNOSIS — Z79899 Other long term (current) drug therapy: Secondary | ICD-10-CM | POA: Diagnosis not present

## 2015-05-11 DIAGNOSIS — C9000 Multiple myeloma not having achieved remission: Secondary | ICD-10-CM | POA: Diagnosis not present

## 2015-05-11 DIAGNOSIS — D61810 Antineoplastic chemotherapy induced pancytopenia: Secondary | ICD-10-CM | POA: Diagnosis not present

## 2015-05-11 DIAGNOSIS — I10 Essential (primary) hypertension: Secondary | ICD-10-CM | POA: Diagnosis not present

## 2015-05-11 DIAGNOSIS — T451X5S Adverse effect of antineoplastic and immunosuppressive drugs, sequela: Secondary | ICD-10-CM | POA: Diagnosis not present

## 2015-05-11 DIAGNOSIS — Z9484 Stem cells transplant status: Secondary | ICD-10-CM | POA: Diagnosis not present

## 2015-05-12 DIAGNOSIS — Z5112 Encounter for antineoplastic immunotherapy: Secondary | ICD-10-CM | POA: Diagnosis not present

## 2015-05-12 DIAGNOSIS — C9000 Multiple myeloma not having achieved remission: Secondary | ICD-10-CM | POA: Diagnosis not present

## 2015-05-12 DIAGNOSIS — Z48298 Encounter for aftercare following other organ transplant: Secondary | ICD-10-CM | POA: Diagnosis not present

## 2015-05-12 DIAGNOSIS — Z9484 Stem cells transplant status: Secondary | ICD-10-CM | POA: Diagnosis not present

## 2015-05-13 DIAGNOSIS — I10 Essential (primary) hypertension: Secondary | ICD-10-CM | POA: Diagnosis not present

## 2015-05-13 DIAGNOSIS — D61810 Antineoplastic chemotherapy induced pancytopenia: Secondary | ICD-10-CM | POA: Diagnosis not present

## 2015-05-13 DIAGNOSIS — Z79899 Other long term (current) drug therapy: Secondary | ICD-10-CM | POA: Diagnosis not present

## 2015-05-13 DIAGNOSIS — R110 Nausea: Secondary | ICD-10-CM | POA: Diagnosis not present

## 2015-05-13 DIAGNOSIS — Z9484 Stem cells transplant status: Secondary | ICD-10-CM | POA: Insufficient documentation

## 2015-05-13 DIAGNOSIS — C9000 Multiple myeloma not having achieved remission: Secondary | ICD-10-CM | POA: Diagnosis not present

## 2015-05-13 DIAGNOSIS — Z48298 Encounter for aftercare following other organ transplant: Secondary | ICD-10-CM | POA: Diagnosis not present

## 2015-05-13 DIAGNOSIS — T451X5A Adverse effect of antineoplastic and immunosuppressive drugs, initial encounter: Secondary | ICD-10-CM | POA: Diagnosis not present

## 2015-05-14 DIAGNOSIS — C9000 Multiple myeloma not having achieved remission: Secondary | ICD-10-CM | POA: Diagnosis not present

## 2015-05-15 DIAGNOSIS — C9000 Multiple myeloma not having achieved remission: Secondary | ICD-10-CM | POA: Diagnosis not present

## 2015-05-16 DIAGNOSIS — C9000 Multiple myeloma not having achieved remission: Secondary | ICD-10-CM | POA: Diagnosis not present

## 2015-05-17 DIAGNOSIS — C9000 Multiple myeloma not having achieved remission: Secondary | ICD-10-CM | POA: Diagnosis not present

## 2015-05-18 DIAGNOSIS — C9000 Multiple myeloma not having achieved remission: Secondary | ICD-10-CM | POA: Diagnosis not present

## 2015-05-18 DIAGNOSIS — T451X5S Adverse effect of antineoplastic and immunosuppressive drugs, sequela: Secondary | ICD-10-CM | POA: Diagnosis not present

## 2015-05-18 DIAGNOSIS — E876 Hypokalemia: Secondary | ICD-10-CM | POA: Diagnosis not present

## 2015-05-18 DIAGNOSIS — Z5112 Encounter for antineoplastic immunotherapy: Secondary | ICD-10-CM | POA: Diagnosis not present

## 2015-05-18 DIAGNOSIS — D61810 Antineoplastic chemotherapy induced pancytopenia: Secondary | ICD-10-CM | POA: Diagnosis not present

## 2015-05-18 DIAGNOSIS — I10 Essential (primary) hypertension: Secondary | ICD-10-CM | POA: Diagnosis not present

## 2015-05-19 ENCOUNTER — Other Ambulatory Visit (HOSPITAL_COMMUNITY): Payer: Self-pay | Admitting: *Deleted

## 2015-05-19 DIAGNOSIS — C9000 Multiple myeloma not having achieved remission: Secondary | ICD-10-CM | POA: Diagnosis not present

## 2015-05-19 DIAGNOSIS — Z79899 Other long term (current) drug therapy: Secondary | ICD-10-CM | POA: Diagnosis not present

## 2015-05-20 DIAGNOSIS — C9000 Multiple myeloma not having achieved remission: Secondary | ICD-10-CM | POA: Diagnosis not present

## 2015-05-20 DIAGNOSIS — Z9484 Stem cells transplant status: Secondary | ICD-10-CM | POA: Diagnosis not present

## 2015-05-20 DIAGNOSIS — D696 Thrombocytopenia, unspecified: Secondary | ICD-10-CM | POA: Diagnosis not present

## 2015-05-20 DIAGNOSIS — Z452 Encounter for adjustment and management of vascular access device: Secondary | ICD-10-CM | POA: Diagnosis not present

## 2015-05-20 DIAGNOSIS — I10 Essential (primary) hypertension: Secondary | ICD-10-CM | POA: Diagnosis not present

## 2015-05-20 DIAGNOSIS — J45909 Unspecified asthma, uncomplicated: Secondary | ICD-10-CM | POA: Diagnosis not present

## 2015-05-20 DIAGNOSIS — Z79899 Other long term (current) drug therapy: Secondary | ICD-10-CM | POA: Diagnosis not present

## 2015-05-24 ENCOUNTER — Encounter (HOSPITAL_COMMUNITY): Payer: Medicare Other | Attending: Hematology & Oncology

## 2015-05-24 DIAGNOSIS — D649 Anemia, unspecified: Secondary | ICD-10-CM | POA: Insufficient documentation

## 2015-05-24 DIAGNOSIS — Z9889 Other specified postprocedural states: Secondary | ICD-10-CM | POA: Diagnosis not present

## 2015-05-24 DIAGNOSIS — Z79899 Other long term (current) drug therapy: Secondary | ICD-10-CM | POA: Insufficient documentation

## 2015-05-24 DIAGNOSIS — I10 Essential (primary) hypertension: Secondary | ICD-10-CM | POA: Insufficient documentation

## 2015-05-24 DIAGNOSIS — C9000 Multiple myeloma not having achieved remission: Secondary | ICD-10-CM | POA: Diagnosis not present

## 2015-05-24 LAB — COMPREHENSIVE METABOLIC PANEL
ALK PHOS: 63 U/L (ref 38–126)
ALT: 15 U/L (ref 14–54)
AST: 20 U/L (ref 15–41)
Albumin: 3.5 g/dL (ref 3.5–5.0)
Anion gap: 10 (ref 5–15)
BUN: 9 mg/dL (ref 6–20)
CALCIUM: 9 mg/dL (ref 8.9–10.3)
CO2: 28 mmol/L (ref 22–32)
Chloride: 99 mmol/L — ABNORMAL LOW (ref 101–111)
Creatinine, Ser: 1 mg/dL (ref 0.44–1.00)
GFR calc non Af Amer: >60 mL/min (ref >60)
Glucose, Bld: 126 mg/dL — ABNORMAL HIGH (ref 65–99)
Potassium: 3.7 mmol/L (ref 3.5–5.1)
SODIUM: 137 mmol/L (ref 135–145)
Total Bilirubin: 0.5 mg/dL (ref 0.3–1.2)
Total Protein: 7.7 g/dL (ref 6.5–8.1)

## 2015-05-24 LAB — CBC WITH DIFFERENTIAL/PLATELET
BASOS ABS: 0 10*3/uL (ref 0.0–0.1)
Basophils Relative: 0 %
Eosinophils Absolute: 0 10*3/uL (ref 0.0–0.7)
Eosinophils Relative: 0 %
HCT: 28.5 % — ABNORMAL LOW (ref 36.0–46.0)
HEMOGLOBIN: 9.7 g/dL — AB (ref 12.0–15.0)
Lymphocytes Relative: 12 %
Lymphs Abs: 0.9 10*3/uL (ref 0.7–4.0)
MCH: 28.4 pg (ref 26.0–34.0)
MCHC: 34 g/dL (ref 30.0–36.0)
MCV: 83.3 fL (ref 78.0–100.0)
Monocytes Absolute: 2 10*3/uL — ABNORMAL HIGH (ref 0.1–1.0)
Monocytes Relative: 29 %
NEUTROS ABS: 4.2 10*3/uL (ref 1.7–7.7)
NEUTROS PCT: 59 %
Platelets: 158 10*3/uL (ref 150–400)
RBC: 3.42 MIL/uL — AB (ref 3.87–5.11)
RDW: 16.7 % — ABNORMAL HIGH (ref 11.5–15.5)
WBC: 7.1 10*3/uL (ref 4.0–10.5)

## 2015-05-24 LAB — MAGNESIUM: Magnesium: 1.5 mg/dL — ABNORMAL LOW (ref 1.7–2.4)

## 2015-05-31 ENCOUNTER — Encounter (HOSPITAL_COMMUNITY): Payer: Medicare Other

## 2015-05-31 DIAGNOSIS — Z9889 Other specified postprocedural states: Secondary | ICD-10-CM | POA: Diagnosis not present

## 2015-05-31 DIAGNOSIS — Z79899 Other long term (current) drug therapy: Secondary | ICD-10-CM | POA: Diagnosis not present

## 2015-05-31 DIAGNOSIS — D649 Anemia, unspecified: Secondary | ICD-10-CM | POA: Diagnosis not present

## 2015-05-31 DIAGNOSIS — I10 Essential (primary) hypertension: Secondary | ICD-10-CM | POA: Diagnosis not present

## 2015-05-31 DIAGNOSIS — C9000 Multiple myeloma not having achieved remission: Secondary | ICD-10-CM | POA: Diagnosis not present

## 2015-05-31 LAB — COMPREHENSIVE METABOLIC PANEL
ALK PHOS: 57 U/L (ref 38–126)
ALT: 15 U/L (ref 14–54)
AST: 16 U/L (ref 15–41)
Albumin: 3.5 g/dL (ref 3.5–5.0)
Anion gap: 10 (ref 5–15)
BUN: 13 mg/dL (ref 6–20)
CALCIUM: 9.7 mg/dL (ref 8.9–10.3)
CO2: 27 mmol/L (ref 22–32)
CREATININE: 1.03 mg/dL — AB (ref 0.44–1.00)
Chloride: 102 mmol/L (ref 101–111)
GFR, EST NON AFRICAN AMERICAN: 59 mL/min — AB (ref >60)
Glucose, Bld: 105 mg/dL — ABNORMAL HIGH (ref 65–99)
Potassium: 4.1 mmol/L (ref 3.5–5.1)
Sodium: 139 mmol/L (ref 135–145)
Total Bilirubin: 0.3 mg/dL (ref 0.3–1.2)
Total Protein: 7.9 g/dL (ref 6.5–8.1)

## 2015-05-31 LAB — CBC WITH DIFFERENTIAL/PLATELET
BASOS ABS: 0 10*3/uL (ref 0.0–0.1)
Basophils Relative: 0 %
EOS PCT: 1 %
Eosinophils Absolute: 0.1 10*3/uL (ref 0.0–0.7)
HEMATOCRIT: 29.9 % — AB (ref 36.0–46.0)
HEMOGLOBIN: 9.9 g/dL — AB (ref 12.0–15.0)
Lymphocytes Relative: 30 %
Lymphs Abs: 2.2 10*3/uL (ref 0.7–4.0)
MCH: 27 pg (ref 26.0–34.0)
MCHC: 33.1 g/dL (ref 30.0–36.0)
MCV: 81.7 fL (ref 78.0–100.0)
Monocytes Absolute: 0.7 10*3/uL (ref 0.1–1.0)
Monocytes Relative: 10 %
NEUTROS PCT: 59 %
Neutro Abs: 4.2 10*3/uL (ref 1.7–7.7)
Platelets: 378 10*3/uL (ref 150–400)
RBC: 3.66 MIL/uL — AB (ref 3.87–5.11)
RDW: 16.2 % — ABNORMAL HIGH (ref 11.5–15.5)
WBC: 7.1 10*3/uL (ref 4.0–10.5)

## 2015-05-31 LAB — MAGNESIUM: MAGNESIUM: 1.8 mg/dL (ref 1.7–2.4)

## 2015-06-03 ENCOUNTER — Encounter (HOSPITAL_COMMUNITY): Payer: Self-pay | Admitting: Hematology & Oncology

## 2015-06-03 ENCOUNTER — Encounter (HOSPITAL_BASED_OUTPATIENT_CLINIC_OR_DEPARTMENT_OTHER): Payer: Medicare Other | Admitting: Hematology & Oncology

## 2015-06-03 VITALS — BP 110/89 | HR 107 | Temp 98.2°F | Resp 18 | Wt 296.6 lb

## 2015-06-03 DIAGNOSIS — E876 Hypokalemia: Secondary | ICD-10-CM

## 2015-06-03 DIAGNOSIS — C9000 Multiple myeloma not having achieved remission: Secondary | ICD-10-CM | POA: Diagnosis not present

## 2015-06-03 DIAGNOSIS — T502X5A Adverse effect of carbonic-anhydrase inhibitors, benzothiadiazides and other diuretics, initial encounter: Principal | ICD-10-CM

## 2015-06-03 DIAGNOSIS — C9001 Multiple myeloma in remission: Secondary | ICD-10-CM

## 2015-06-03 MED ORDER — DIAZEPAM 5 MG PO TABS: ORAL_TABLET | ORAL | Status: DC

## 2015-06-03 MED ORDER — POTASSIUM CHLORIDE CRYS ER 20 MEQ PO TBCR: 20.0000 meq | EXTENDED_RELEASE_TABLET | Freq: Three times a day (TID) | ORAL | Status: DC

## 2015-06-03 NOTE — Progress Notes (Signed)
[facility] Hematology/Oncology Progress Note   Name: Kelli Hurley      MRN: [id number]    Date: 06/03/2015 Time:1:56 PM   REFERRING PHYSICIAN:  Dr. Charlotte Sanes   DIAGNOSIS:  Multiple Myeloma IgG kappa, STAGE II, serum albumin 3.5 g/dl  HISTORY OF PRESENT ILLNESS:   Kelli Hurley is a 60 year old black American woman with a past medical history significant for HTN and obesity who was referred to [facility] for anemia, leukopenia, and cryglobulinemia. She is here for follow-up of Multiple Myeloma. She has just undergone autologous transplantation at [facility].  Kelli Hurley was here with two family members today.  She has been sleeping and eating well and her breathing has been okay.  She goes back to see Dr. Norma Fredrickson on May 1st.   She needs a refill on her potassium.   She denies any diarrhea or vomiting.  She has no other concerns or questions at this time.  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 Discharge from the Outpatient Autologous Stem Cell Transplant.  Discussion:   Review of outpatient autologous stem cell transplant discharge instructions.  The first 100 days following high dose chemotherapy and stem cell infusion is a time of transition in the recovery of the patient. The risk for post-transplant complications is highest in the first 100 days following high dose chemotherapy and stem cell infusion. During this time, the patient can expect their blood cell counts to become stable and the immune system will become stronger. The patient will feel weaker for several weeks or months. Between now and the next post-transplant visit, the patient should expect to be monitored for infections and other problems by physical exam, review of systems, and evaluation of labs monthly. They will continue to receive medications to reduce  the risk of infection. Patients who develop any new symptoms should contact us or their primary doctor right away.    ID prophylaxis: Acyclovir at 800 mg twice daily for VZV prophylaxis and plan to start Bactrim DS on Monday, Wednesday, Friday at ~Day +30 post transplant (06/07/2015).   Health promotion: Aerobic exercise was encouraged to promote better overall condition and prevent long term complications from treatment / transplant.   Follow-up. Kelli Hurley will return for Day +30 post transplant evaluation on 06/07/2015. She knows to seek medical attention prior to this time should she have questions, problems, concerns or changes in her status.  Electronically signed by: Dewain Penning, NP 05/20/15 1125    PAST MEDICAL HISTORY:   Past Medical History  Diagnosis Date  . Hypertension   . Anemia   . Normocytic hypochromic anemia 08/03/2014  . Leukopenia 08/03/2014  . Claustrophobia 10/05/2014    ALLERGIES: No Known Allergies    MEDICATIONS: I have reviewed the patient's current medications.    Current Outpatient Prescriptions on File Prior to Visit  Medication Sig Dispense Refill  . acyclovir (ZOVIRAX) 400 MG tablet TAKE 1 TABLET BY MOUTH TWICE DAILY 60 tablet 2  . hydrochlorothiazide (HYDRODIURIL) 25 MG tablet Take 25 mg by mouth every morning.    Marland Kitchen ibuprofen (ADVIL,MOTRIN) 200 MG tablet Take 200 mg by mouth every 6 (six) hours as needed.    . ondansetron (ZOFRAN) 8 MG tablet Take 8 mg by mouth every 8 (eight) hours as needed for nausea or vomiting. Reported on  04/22/2015    . potassium chloride SA (K-DUR,KLOR-CON) 20 MEQ tablet Take 1 tablet (20 mEq total) by mouth 2 (two) times daily. (Patient taking differently: Take 20 mEq by mouth 3 (three) times daily. ) 60 tablet 3  . prochlorperazine (COMPAZINE) 10 MG tablet Take 10 mg by mouth every 6 (six) hours as needed for nausea or vomiting. Reported on 04/22/2015    . albuterol (PROVENTIL HFA;VENTOLIN HFA) 108 (90 BASE) MCG/ACT  inhaler Inhale 1 puff into the lungs every 6 (six) hours as needed for wheezing or shortness of breath. Reported on 06/03/2015    . ANORO ELLIPTA 62.5-25 MCG/INH AEPB Reported on 06/03/2015     Current Facility-Administered Medications on File Prior to Visit  Medication Dose Route Frequency Provider Last Rate Last Dose  . heparin lock flush 100 unit/mL  500 Units Intravenous Once Patrici Ranks, MD      . sodium chloride 0.9 % injection 10 mL  10 mL Intravenous Once Patrici Ranks, MD         PAST SURGICAL HISTORY Past Surgical History  Procedure Laterality Date  . Abdominal hysterectomy    . Other surgical history      heart surgery as infant to "repair hole in heart"    FAMILY HISTORY: Family History  Problem Relation Age of Onset  . Cancer Mother   . Hypertension Mother   . Cancer Father   . Hypertension Father   . Cancer Maternal Grandmother     SOCIAL HISTORY: She reports that she used to smoke as a teenager, but quit > 40 years ago.  She denies EtOH abuse.  She denies illicit drug abuse.  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  She live byherself.  She is divorced with two daughters ages 68 and 57 who are healthy.  Her grandbaby is healthy too.  She is Psychologist, forensic.  PERFORMANCE STATUS: The patient's performance status is 1 - Symptomatic but completely ambulatory  ROS: Neurological: Negative for neuropathy.  14 point review of systems was performed and is negative except as detailed under history of present illness and above  PHYSICAL EXAM: Most Recent Vital Signs: Blood pressure 110/89, pulse 107, temperature 98.2 F (36.8 C), temperature source Oral, resp. rate 18, weight 296 lb 9.6 oz (134.537 kg), SpO2 100 %. General appearance: alert, cooperative, appears stated age, no distress and morbidly obese. Assisted onto exam table. Head: Normocephalic, without obvious abnormality, atraumatic Eyes:  conjunctivae/corneas clear. PERRL, EOM's intact. Fundi benign. Throat: lips, mucosa, and tongue normal; teeth and gums normal Neck: no adenopathy, supple, symmetrical, trachea midline and thyroid not enlarged, symmetric, no tenderness/mass/nodules Back: symmetric, no curvature. ROM normal. No CVA tenderness. Lungs: clear to auscultation bilaterally and normal percussion bilaterally Heart: regular rate and rhythm, S1, S2 normal, no murmur, click, rub or gallop Abdomen: soft, non-tender; bowel sounds normal; no masses,  no organomegaly Extremities: extremities normal, atraumatic, no cyanosis edema at the ankles bilaterally. Right lower extremity greater than left lower extremity Skin: Skin color, texture, turgor normal. No rashes or lesions Lymph nodes: Cervical, supraclavicular, and axillary nodes normal. Neurologic: Alert and oriented X 3, normal strength and tone. Normal symmetric reflexes. Normal coordination and gait   LABORATORY DATA:  I have reviewed the data as listed.  Results for REGAN, MCBRYAR (MRN [id number]) as of 06/03/2015 12:21  Ref. Range 05/31/2015 11:03  Sodium Latest Ref Range: 135-145 mmol/L 139  Potassium Latest Ref Range: 3.5-5.1 mmol/L 4.1  Chloride Latest Ref Range: 101-111 mmol/L 102  CO2 Latest Ref Range: 22-32 mmol/L 27  BUN Latest Ref Range: 6-20 mg/dL 13  Creatinine Latest Ref Range: 0.44-1.00 mg/dL 1.03 (H)  Calcium Latest Ref Range: 8.9-10.3 mg/dL 9.7  EGFR (Non-African Amer.) Latest Ref Range: >60 mL/min 59 (L)  EGFR (African American) Latest Ref Range: >60 mL/min >60  Glucose Latest Ref Range: 65-99 mg/dL 105 (H)  Anion gap Latest Ref Range: 5-15  10  Magnesium Latest Ref Range: 1.7-2.4 mg/dL 1.8  Alkaline Phosphatase Latest Ref Range: 38-126 U/L 57  Albumin Latest Ref Range: 3.5-5.0 g/dL 3.5  AST Latest Ref Range: 15-41 U/L 16  ALT Latest Ref Range: 14-54 U/L 15  Total Protein Latest Ref Range: 6.5-8.1 g/dL 7.9  Total Bilirubin Latest Ref Range:  0.3-1.2 mg/dL 0.3  WBC Latest Ref Range: 4.0-10.5 K/uL 7.1  RBC Latest Ref Range: 3.87-5.11 MIL/uL 3.66 (L)  Hemoglobin Latest Ref Range: 12.0-15.0 g/dL 9.9 (L)  HCT Latest Ref Range: 36.0-46.0 % 29.9 (L)  MCV Latest Ref Range: 78.0-100.0 fL 81.7  MCH Latest Ref Range: 26.0-34.0 pg 27.0  MCHC Latest Ref Range: 30.0-36.0 g/dL 33.1  RDW Latest Ref Range: 11.5-15.5 % 16.2 (H)  Platelets Latest Ref Range: 150-400 K/uL 378  Neutrophils Latest Units: % 59  Lymphocytes Latest Units: % 30  Monocytes Relative Latest Units: % 10  Eosinophil Latest Units: % 1  Basophil Latest Units: % 0  NEUT# Latest Ref Range: 1.7-7.7 K/uL 4.2  Lymphocyte # Latest Ref Range: 0.7-4.0 K/uL 2.2  Monocyte # Latest Ref Range: 0.1-1.0 K/uL 0.7  Eosinophils Absolute Latest Ref Range: 0.0-0.7 K/uL 0.1  Basophils Absolute Latest Ref Range: 0.0-0.1 K/uL 0.0    Bone Marrow BiopsyS Diagnosis Bone Marrow, Aspirate,Biopsy, and Clot - NORMOCELLULAR BONE MARROW FOR AGE WITH PLASMA CELL NEOPLASM. - TRILINEAGE HEMATOPOIESIS. - SEE COMMENT. PERIPHERAL BLOOD: - NORMOCYTIC-NORMOCHROMIC ANEMIA - LEUKOPENIA Diagnosis Note Despite limited aspirate material, the plasma cell component is increased in the marrow representing an estimated 18% of all cells with associated atypical cytomorphologic features. Immunohistochemical stains show the the plasma cells are kappa light chain-restricted consistent with plasma cell neoplasm. The background shows trilineage hematopoiesis with non specific changes. Correlation with cytogenetic and FISH studies is recommended. (BNS:ds/gt, 09/04/14) Susanne Greenhouse MD Pathologist, Electronic Signature (Case signed 09/07/2014)   RADIOGRAPHIC STUDIES: I have reviewed the images below and agree with the results:  Result Impression   No FDG avid osseous or soft tissue lesions are identified. Ancillary CT findings as above.   Result Narrative  PET MULTIPLE MYELOMA (W/LOW DOSE CT), 04/07/2015 10:46  AM  INDICATION: Evaluation for stem cell transplant COMPARISON: Outside facility PET/CT 09/30/2014  TECHNIQUE: 79 minutes after the intravenous injection of 13.218 mCi F-18 FDG, images were obtained from the vertex through the toes. These images were attenuation corrected using CT. Standardized uptake values (SUV) were calculated using a lean body mass algorithm. Blood glucose at the time of injection was 81 mg/dL.  [facility] and its affiliates are committed to minimizing radiation dose to patients while maintaining necessary diagnostic image quality. All CT scans are therefore performed using "As Low As Reasonably Achievable (ALARA)" protocols with either manual or automated exposure controls calibrated to the age and size of each patient.  LIMITATIONS: The low-dose CT acquisition was performed only for attenuation correction/activity localization. There is no intravenous contrast, further limiting the CT component of the study. Patient body habitus further  limits diagnostic quality of the CT portion of the exam. This modality has limited utility for detection or characterization of small lung nodules. Evaluation of the vasculature is limited by lack of IV contrast. Evaluation of the kidney, ureters, and bladder are limited by urinary excretion of radiotracer. Physiologic bowel uptake of FDG and lack of CT contrast limit evaluation of the bowel.   FINDINGS:   HEAD and NECK:     No abnormal FDG uptake is seen in the face, orbits, sinuses, oral cavity, or thyroid. Ancillary head and neck CT findings: None.              CHEST:     No abnormal FDG uptake is seen in the heart, lungs, pleura, esophagus, hila/mediastinum, axilla, or breasts. Ancillary chest CT findings: None.              ABDOMEN/PELVIS:     No abnormal FDG uptake is seen in the liver, spleen, gallbladder, pancreas, adrenals, peritoneum, extraperitoneum, nodes, or reproductive tract. Ancillary abdomen and  pelvis CT findings: None.              MUSCULOSKELETAL:     No abnormal FDG uptake is seen in the soft tissues or bones. Ancillary musculoskeletal CT findings: Bilateral os acromiale. Degenerative changes in the lower back, SI joints, hips, and knees. Calcific tendinitis of the right Achilles tendon.         Expected physiologic activity within the kidneys, ureter, bladder, oropharynx, salivary glands, stomach, bowel and brain.    PATHOLOGY:  ACCESSION NUMBER: [id number] RECEIVED: 04/07/2015 ORDERING PHYSICIAN: CESAR Zigmund Daniel , MD PATIENT NAME: Chauncy Passy BONE MARROW REPORT   Bone Marrow (BM) and Peripheral Blood (PB) FINAL PATHOLOGIC DIAGNOSIS  BONE MARROW: Normocellular marrow (40%) with approximately 2-4% plasma cells. See comment.  PERIPHERAL BLOOD: Mild normocytic anemia and leucopenia. No rouleaux formation. See CBC data.  COMMENT: The bone marrow aspirate smears are paucispicular, paucicellular and adequate for evaluation. There is no increase in plasma cells (1% by manual differential counts). Blasts are not increased in numbers (less than 1% by manual differential counts). The myeloid and erythroid elements are present in normal proportions with an M: E ratio of 1.8:1. There are no overt dysplastic features of the myeloid or erythroid lineages. Megakaryocytes are quantitatively and qualitatively unremarkable.  Examination of the bone marrow core biopsy is hampered by aspiration artifact, but reveals an overall normocellular marrow for the patient's age (40%). Myeloid and erythroid elements are present in normal proportions. Megakaryocytes are morphologically unremarkable and are present without clustering. There are no atypical lymphoid aggregates or granulomatous formations identified. CD138 immunohistochemistry reveals approximately 2-4% plasma cells. In situ hybridization could not be performed on the core biopsy. No plasma cell clusters are seen.  Examination of the clot sections reveals mostly peripheral blood with a single bone marrow spicule.  The peripheral blood smears reveal no rouleaux formation. The peripheral blood leukocytes are morphologically unremarkable.  The positive immunohistochemical controls were performed and worked appropriately.  CYTOGENETICS Date Ordered: 04/07/2015 Date Reported: 04/13/2015 Interpretation  Laboratory Analysis:  GTG-banded Metaphases: # Cells Karyotyped: Band Resolution:   Molecular Cytogenetic Analysis: (chromosomes pending)   .nuc ish (FGFR3,IgH)x2,(CCND1,IgH)x2,(CEP12,D13S319,LAMP1)x2,(ATM,p53)x2   Molecular Cytogenetic Analysis - FISH: Normal: The investigative technique of molecular-cytogenetic analysis was performed per physician's request using a DNA probe panel specific for regions associated with chromosomal abnormalities of Multiple Myeloma involving loci of chromosomes 4, 11, 12, 13, 14, and 17 on interphase nuclei.  0% of cells showed no  fusion, gain/loss of IGH/FGFR3  This result is below the cut-off value of 2% and is technically negative.    ASSESSMENT/PLAN:  Multiple Myeloma IgG kappa, STAGE II, serum albumin 3.5 g/dl IgG of 04/14/2007 MG/DL, total M spike of 2.7 g/DL Beta-2 microglobulin of 3.7 MG/L Severe Anemia at presentation BMBX with 18% monoclonal plasma cells, kappa light chain restsricted. 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 She looks great.  She has a good understanding of the importance of following all recommendations in regards to  infection prevention.   We discussed having her meet with Elzie Rings with survivorship clinic in two weeks.   She goes back to see Dr. Norma Fredrickson on May 1st.   She needs a refill on her potassium.   She has a PET scan on 06/21/15.  She will return in a month for a follow up.   All questions were answered. The patient knows to call the clinic with any problems, questions or concerns. We can certainly see the patient much sooner if necessary.   This document serves as a record of services personally performed by Ancil Linsey, MD. It was created on her behalf by Kandace Blitz, a trained medical scribe. The creation of this record is based on the scribe's personal observations and the provider's statements to them. This document has been checked and approved by the attending provider.  I have reviewed the above documentation for accuracy and completeness, and I agree with the above.  This note is electronically signed.  Kelby Fam. Whitney Muse, MD

## 2015-06-03 NOTE — Patient Instructions (Addendum)
Kelli Hurley at [facility] Discharge Instructions  RECOMMENDATIONS MADE BY THE CONSULTANT AND ANY TEST RESULTS WILL BE SENT TO YOUR REFERRING PHYSICIAN.  Exam and discussion by Dr Whitney Muse today Be careful your immune system is very weakened. If you have fevers are any problems let us know  Labs weekly See gretchen in survivorship clinic in 2 weeks   Return to see the doctor in 1 month Please call the clinic if you have any questions or concerns     Thank you for choosing [place] at [facility] to provide your oncology and hematology care.  To afford each patient quality time with our provider, please arrive at least 15 minutes before your scheduled appointment time.   Beginning January 23rd 2017 lab work for the [name] will be done in the  Main lab at [facility] on 1st floor. If you have a lab appointment with the [facility] please come in thru the  Main Entrance and check in at the main information desk  You need to re-schedule your appointment should you arrive 10 or more minutes late.  We strive to give you quality time with our providers, and arriving late affects you and other patients whose appointments are after yours.  Also, if you no show three or more times for appointments you may be dismissed from the clinic at the providers discretion.     Again, thank you for choosing [facility].  Our hope is that these requests will decrease the amount of time that you wait before being seen by our physicians.       _____________________________________________________________  Should you have questions after your visit to [facility], please contact our office at [phone] between the hours of 8:30 a.m. and 4:30 p.m.  Voicemails left after 4:30 p.m. will not be returned until the following business day.  For prescription refill requests, have your pharmacy contact our office.         Resources  For Cancer Patients and their Caregivers ? American Cancer Society: Can assist with transportation, wigs, general needs, runs Look Good Feel Better.        4315993855 ? Cancer Care: Provides financial assistance, online support groups, medication/co-pay assistance.  1-800-813-HOPE 8132776232) ? [facility] Assists [place] cancer patients and their families through emotional , educational and financial support.  (630) 689-4465 ? [name] Where to apply for food stamps, Medicaid and utility assistance. 408-088-1146 ? [name]: Transportation to medical appointments. 934-443-4328 ? Social Security Administration: May apply for disability if have a Stage IV cancer. 419-812-6543 614-887-1226 ? [name], Disability and Transit Services: Assists with nutrition, care and transit needs. 918-725-2079

## 2015-06-04 ENCOUNTER — Encounter (HOSPITAL_COMMUNITY): Payer: Self-pay | Admitting: Hematology & Oncology

## 2015-06-07 DIAGNOSIS — C9000 Multiple myeloma not having achieved remission: Secondary | ICD-10-CM | POA: Diagnosis not present

## 2015-06-07 DIAGNOSIS — Z79899 Other long term (current) drug therapy: Secondary | ICD-10-CM | POA: Diagnosis not present

## 2015-06-07 DIAGNOSIS — Z9484 Stem cells transplant status: Secondary | ICD-10-CM | POA: Diagnosis not present

## 2015-06-07 DIAGNOSIS — J45909 Unspecified asthma, uncomplicated: Secondary | ICD-10-CM | POA: Diagnosis not present

## 2015-06-07 DIAGNOSIS — I10 Essential (primary) hypertension: Secondary | ICD-10-CM | POA: Diagnosis not present

## 2015-06-10 ENCOUNTER — Encounter (HOSPITAL_COMMUNITY): Payer: Medicare Other | Attending: Oncology

## 2015-06-10 DIAGNOSIS — D509 Iron deficiency anemia, unspecified: Secondary | ICD-10-CM | POA: Insufficient documentation

## 2015-06-10 DIAGNOSIS — C9000 Multiple myeloma not having achieved remission: Secondary | ICD-10-CM

## 2015-06-10 DIAGNOSIS — D72819 Decreased white blood cell count, unspecified: Secondary | ICD-10-CM | POA: Insufficient documentation

## 2015-06-10 LAB — CBC WITH DIFFERENTIAL/PLATELET
BASOS PCT: 0 %
Basophils Absolute: 0 10*3/uL (ref 0.0–0.1)
EOS ABS: 0.2 10*3/uL (ref 0.0–0.7)
EOS PCT: 3 %
HCT: 32.7 % — ABNORMAL LOW (ref 36.0–46.0)
HEMOGLOBIN: 10.7 g/dL — AB (ref 12.0–15.0)
Lymphocytes Relative: 20 %
Lymphs Abs: 1.2 10*3/uL (ref 0.7–4.0)
MCH: 27.7 pg (ref 26.0–34.0)
MCHC: 32.7 g/dL (ref 30.0–36.0)
MCV: 84.7 fL (ref 78.0–100.0)
MONOS PCT: 14 %
Monocytes Absolute: 0.8 10*3/uL (ref 0.1–1.0)
NEUTROS PCT: 63 %
Neutro Abs: 3.6 10*3/uL (ref 1.7–7.7)
PLATELETS: 315 10*3/uL (ref 150–400)
RBC: 3.86 MIL/uL — ABNORMAL LOW (ref 3.87–5.11)
RDW: 17.4 % — ABNORMAL HIGH (ref 11.5–15.5)
WBC: 5.7 10*3/uL (ref 4.0–10.5)

## 2015-06-10 LAB — COMPREHENSIVE METABOLIC PANEL
ALK PHOS: 46 U/L (ref 38–126)
ALT: 11 U/L — ABNORMAL LOW (ref 14–54)
ANION GAP: 10 (ref 5–15)
AST: 19 U/L (ref 15–41)
Albumin: 3.8 g/dL (ref 3.5–5.0)
BUN: 12 mg/dL (ref 6–20)
CALCIUM: 9.4 mg/dL (ref 8.9–10.3)
CHLORIDE: 105 mmol/L (ref 101–111)
CO2: 26 mmol/L (ref 22–32)
Creatinine, Ser: 1.08 mg/dL — ABNORMAL HIGH (ref 0.44–1.00)
GFR calc non Af Amer: 55 mL/min — ABNORMAL LOW (ref >60)
GLUCOSE: 101 mg/dL — AB (ref 65–99)
POTASSIUM: 3.6 mmol/L (ref 3.5–5.1)
SODIUM: 141 mmol/L (ref 135–145)
Total Bilirubin: 0.3 mg/dL (ref 0.3–1.2)
Total Protein: 7.7 g/dL (ref 6.5–8.1)

## 2015-06-10 LAB — MAGNESIUM: Magnesium: 1.7 mg/dL (ref 1.7–2.4)

## 2015-06-15 ENCOUNTER — Ambulatory Visit (HOSPITAL_COMMUNITY): Payer: Medicare Other | Admitting: Adult Health

## 2015-06-15 ENCOUNTER — Other Ambulatory Visit (HOSPITAL_COMMUNITY): Payer: Self-pay | Admitting: Oncology

## 2015-06-15 ENCOUNTER — Encounter (HOSPITAL_COMMUNITY): Payer: Medicare Other

## 2015-06-15 DIAGNOSIS — D509 Iron deficiency anemia, unspecified: Secondary | ICD-10-CM | POA: Diagnosis not present

## 2015-06-15 DIAGNOSIS — D72819 Decreased white blood cell count, unspecified: Secondary | ICD-10-CM | POA: Diagnosis not present

## 2015-06-15 DIAGNOSIS — C9000 Multiple myeloma not having achieved remission: Secondary | ICD-10-CM

## 2015-06-15 LAB — CBC WITH DIFFERENTIAL/PLATELET
BASOS PCT: 0 %
Basophils Absolute: 0 10*3/uL (ref 0.0–0.1)
Eosinophils Absolute: 0.2 10*3/uL (ref 0.0–0.7)
Eosinophils Relative: 4 %
HEMATOCRIT: 33.4 % — AB (ref 36.0–46.0)
HEMOGLOBIN: 10.8 g/dL — AB (ref 12.0–15.0)
LYMPHS ABS: 1.2 10*3/uL (ref 0.7–4.0)
Lymphocytes Relative: 22 %
MCH: 27.5 pg (ref 26.0–34.0)
MCHC: 32.3 g/dL (ref 30.0–36.0)
MCV: 85 fL (ref 78.0–100.0)
MONO ABS: 0.5 10*3/uL (ref 0.1–1.0)
MONOS PCT: 9 %
NEUTROS ABS: 3.5 10*3/uL (ref 1.7–7.7)
NEUTROS PCT: 65 %
Platelets: 253 10*3/uL (ref 150–400)
RBC: 3.93 MIL/uL (ref 3.87–5.11)
RDW: 17.3 % — AB (ref 11.5–15.5)
WBC: 5.3 10*3/uL (ref 4.0–10.5)

## 2015-06-15 LAB — COMPREHENSIVE METABOLIC PANEL
ALBUMIN: 3.9 g/dL (ref 3.5–5.0)
ALT: 11 U/L — ABNORMAL LOW (ref 14–54)
ANION GAP: 11 (ref 5–15)
AST: 19 U/L (ref 15–41)
Alkaline Phosphatase: 43 U/L (ref 38–126)
BILIRUBIN TOTAL: 0.3 mg/dL (ref 0.3–1.2)
BUN: 10 mg/dL (ref 6–20)
CHLORIDE: 105 mmol/L (ref 101–111)
CO2: 25 mmol/L (ref 22–32)
Calcium: 9.5 mg/dL (ref 8.9–10.3)
Creatinine, Ser: 1.04 mg/dL — ABNORMAL HIGH (ref 0.44–1.00)
GFR calc Af Amer: >60 mL/min (ref >60)
GFR calc non Af Amer: 58 mL/min — ABNORMAL LOW (ref >60)
GLUCOSE: 100 mg/dL — AB (ref 65–99)
POTASSIUM: 3.3 mmol/L — AB (ref 3.5–5.1)
SODIUM: 141 mmol/L (ref 135–145)
TOTAL PROTEIN: 7.7 g/dL (ref 6.5–8.1)

## 2015-06-15 LAB — MAGNESIUM: Magnesium: 1.6 mg/dL — ABNORMAL LOW (ref 1.7–2.4)

## 2015-06-15 MED ORDER — MAGNESIUM CHLORIDE-CALCIUM 64-106 MG PO TBEC: 3.0000 | DELAYED_RELEASE_TABLET | Freq: Every day | ORAL | Status: DC

## 2015-06-23 DIAGNOSIS — C9000 Multiple myeloma not having achieved remission: Secondary | ICD-10-CM | POA: Diagnosis not present

## 2015-06-23 DIAGNOSIS — I10 Essential (primary) hypertension: Secondary | ICD-10-CM | POA: Diagnosis not present

## 2015-06-24 ENCOUNTER — Encounter (HOSPITAL_COMMUNITY): Payer: Medicare Other

## 2015-06-24 DIAGNOSIS — D72819 Decreased white blood cell count, unspecified: Secondary | ICD-10-CM | POA: Diagnosis not present

## 2015-06-24 DIAGNOSIS — D509 Iron deficiency anemia, unspecified: Secondary | ICD-10-CM | POA: Diagnosis not present

## 2015-06-24 DIAGNOSIS — C9000 Multiple myeloma not having achieved remission: Secondary | ICD-10-CM

## 2015-06-24 LAB — COMPREHENSIVE METABOLIC PANEL
ALBUMIN: 4.1 g/dL (ref 3.5–5.0)
ALT: 8 U/L — ABNORMAL LOW (ref 14–54)
ANION GAP: 6 (ref 5–15)
AST: 17 U/L (ref 15–41)
Alkaline Phosphatase: 47 U/L (ref 38–126)
BUN: 12 mg/dL (ref 6–20)
CALCIUM: 9.3 mg/dL (ref 8.9–10.3)
CHLORIDE: 107 mmol/L (ref 101–111)
CO2: 25 mmol/L (ref 22–32)
Creatinine, Ser: 1.01 mg/dL — ABNORMAL HIGH (ref 0.44–1.00)
GFR calc Af Amer: >60 mL/min (ref >60)
Glucose, Bld: 92 mg/dL (ref 65–99)
POTASSIUM: 4.1 mmol/L (ref 3.5–5.1)
Sodium: 138 mmol/L (ref 135–145)
Total Bilirubin: 0.3 mg/dL (ref 0.3–1.2)
Total Protein: 7.7 g/dL (ref 6.5–8.1)

## 2015-06-24 LAB — CBC WITH DIFFERENTIAL/PLATELET
BASOS ABS: 0 10*3/uL (ref 0.0–0.1)
Basophils Relative: 0 %
EOS PCT: 6 %
Eosinophils Absolute: 0.3 10*3/uL (ref 0.0–0.7)
HCT: 33.2 % — ABNORMAL LOW (ref 36.0–46.0)
Hemoglobin: 10.9 g/dL — ABNORMAL LOW (ref 12.0–15.0)
Lymphocytes Relative: 22 %
Lymphs Abs: 1 10*3/uL (ref 0.7–4.0)
MCH: 28.1 pg (ref 26.0–34.0)
MCHC: 32.8 g/dL (ref 30.0–36.0)
MCV: 85.6 fL (ref 78.0–100.0)
MONO ABS: 0.7 10*3/uL (ref 0.1–1.0)
Monocytes Relative: 14 %
Neutro Abs: 2.6 10*3/uL (ref 1.7–7.7)
Neutrophils Relative %: 58 %
PLATELETS: 245 10*3/uL (ref 150–400)
RBC: 3.88 MIL/uL (ref 3.87–5.11)
RDW: 17 % — AB (ref 11.5–15.5)
WBC: 4.5 10*3/uL (ref 4.0–10.5)

## 2015-06-24 LAB — MAGNESIUM: MAGNESIUM: 2 mg/dL (ref 1.7–2.4)

## 2015-07-02 ENCOUNTER — Encounter (HOSPITAL_BASED_OUTPATIENT_CLINIC_OR_DEPARTMENT_OTHER): Payer: Medicare Other | Admitting: Hematology & Oncology

## 2015-07-02 ENCOUNTER — Encounter (HOSPITAL_COMMUNITY): Payer: Self-pay | Admitting: Hematology & Oncology

## 2015-07-02 ENCOUNTER — Encounter (HOSPITAL_COMMUNITY): Payer: Medicare Other

## 2015-07-02 VITALS — BP 127/82 | HR 79 | Temp 98.1°F | Resp 18 | Wt 298.0 lb

## 2015-07-02 DIAGNOSIS — D509 Iron deficiency anemia, unspecified: Secondary | ICD-10-CM | POA: Diagnosis not present

## 2015-07-02 DIAGNOSIS — C9000 Multiple myeloma not having achieved remission: Secondary | ICD-10-CM

## 2015-07-02 DIAGNOSIS — E876 Hypokalemia: Secondary | ICD-10-CM | POA: Diagnosis not present

## 2015-07-02 DIAGNOSIS — D72819 Decreased white blood cell count, unspecified: Secondary | ICD-10-CM | POA: Diagnosis not present

## 2015-07-02 DIAGNOSIS — T502X5A Adverse effect of carbonic-anhydrase inhibitors, benzothiadiazides and other diuretics, initial encounter: Secondary | ICD-10-CM

## 2015-07-02 DIAGNOSIS — C9001 Multiple myeloma in remission: Secondary | ICD-10-CM

## 2015-07-02 LAB — COMPREHENSIVE METABOLIC PANEL
ALK PHOS: 41 U/L (ref 38–126)
ALT: 12 U/L — AB (ref 14–54)
ANION GAP: 6 (ref 5–15)
AST: 17 U/L (ref 15–41)
Albumin: 4.2 g/dL (ref 3.5–5.0)
BUN: 16 mg/dL (ref 6–20)
CHLORIDE: 105 mmol/L (ref 101–111)
CO2: 26 mmol/L (ref 22–32)
Calcium: 9.5 mg/dL (ref 8.9–10.3)
Creatinine, Ser: 0.98 mg/dL (ref 0.44–1.00)
GFR calc Af Amer: >60 mL/min (ref >60)
Glucose, Bld: 95 mg/dL (ref 65–99)
POTASSIUM: 4.3 mmol/L (ref 3.5–5.1)
Sodium: 137 mmol/L (ref 135–145)
Total Bilirubin: 0.6 mg/dL (ref 0.3–1.2)
Total Protein: 7.5 g/dL (ref 6.5–8.1)

## 2015-07-02 LAB — CBC WITH DIFFERENTIAL/PLATELET
BASOS ABS: 0 10*3/uL (ref 0.0–0.1)
Basophils Relative: 0 %
Eosinophils Absolute: 0.2 10*3/uL (ref 0.0–0.7)
Eosinophils Relative: 4 %
HEMATOCRIT: 32.3 % — AB (ref 36.0–46.0)
HEMOGLOBIN: 10.6 g/dL — AB (ref 12.0–15.0)
LYMPHS PCT: 31 %
Lymphs Abs: 1.4 10*3/uL (ref 0.7–4.0)
MCH: 27.7 pg (ref 26.0–34.0)
MCHC: 32.8 g/dL (ref 30.0–36.0)
MCV: 84.6 fL (ref 78.0–100.0)
Monocytes Absolute: 0.4 10*3/uL (ref 0.1–1.0)
Monocytes Relative: 8 %
NEUTROS ABS: 2.5 10*3/uL (ref 1.7–7.7)
NEUTROS PCT: 57 %
PLATELETS: 206 10*3/uL (ref 150–400)
RBC: 3.82 MIL/uL — AB (ref 3.87–5.11)
RDW: 16.9 % — ABNORMAL HIGH (ref 11.5–15.5)
WBC: 4.4 10*3/uL (ref 4.0–10.5)

## 2015-07-02 LAB — MAGNESIUM: MAGNESIUM: 1.8 mg/dL (ref 1.7–2.4)

## 2015-07-02 NOTE — Patient Instructions (Signed)
[place] at [facility] Discharge Instructions  RECOMMENDATIONS MADE BY THE CONSULTANT AND ANY TEST RESULTS WILL BE SENT TO YOUR REFERRING PHYSICIAN.  Exam and discussion today with [name]. Office visit in 4 weeks with Kirby Crigler, PA. Call the clinic should you have any questions or concerns prior to your next visit.   Thank you for choosing [place] at [facility] to provide your oncology and hematology care.  To afford each patient quality time with our provider, please arrive at least 15 minutes before your scheduled appointment time.   Beginning January 23rd 2017 lab work for the [name] will be done in the  Main lab at [facility] on 1st floor. If you have a lab appointment with the [facility] please come in thru the  Main Entrance and check in at the main information desk  You need to re-schedule your appointment should you arrive 10 or more minutes late.  We strive to give you quality time with our providers, and arriving late affects you and other patients whose appointments are after yours.  Also, if you no show three or more times for appointments you may be dismissed from the clinic at the providers discretion.     Again, thank you for choosing [facility].  Our hope is that these requests will decrease the amount of time that you wait before being seen by our physicians.       _____________________________________________________________  Should you have questions after your visit to [facility], please contact our office at [phone] between the hours of 8:30 a.m. and 4:30 p.m.  Voicemails left after 4:30 p.m. will not be returned until the following business day.  For prescription refill requests, have your pharmacy contact our office.         Resources For Cancer Patients and their Caregivers ? American Cancer Society: Can assist with transportation, wigs, general needs, runs  Look Good Feel Better.        (785)315-3852 ? Cancer Care: Provides financial assistance, online support groups, medication/co-pay assistance.  1-800-813-HOPE (520)006-2808) ? [facility] Assists [place] cancer patients and their families through emotional , educational and financial support.  564-412-5078 ? [name] Where to apply for food stamps, Medicaid and utility assistance. (901) 471-3930 ? [name]: Transportation to medical appointments. 828-312-6814 ? Social Security Administration: May apply for disability if have a Stage IV cancer. (207) 348-5848 (832)628-1409 ? [name], Disability and Transit Services: Assists with nutrition, care and transit needs. [place] Support Programs: @10RELATIVEDAYS @ > Cancer Support Group  2nd Tuesday of the month 1pm-2pm, Journey Room  > Creative Journey  3rd Tuesday of the month 1130am-1pm, Journey Room  > Look Good Feel Better  1st Wednesday of the month 10am-12 noon, Journey Room (Call [facility] to register [phone])

## 2015-07-02 NOTE — Progress Notes (Signed)
      [facility] Hematology/Oncology Progress Note   Name: Kelli Hurley      MRN: [id number]    Date: 07/02/2015 Time:1:06 PM   REFERRING PHYSICIAN:  Dr. Jim Sistasis   DIAGNOSIS:  Multiple Myeloma IgG kappa, STAGE II, serum albumin 3.5 g/dl  HISTORY OF PRESENT ILLNESS:   Kelli Hurley is a 60 year old black American woman with a past medical history significant for HTN and obesity who was referred to [facility] for anemia, leukopenia, and cryglobulinemia. She is here for follow-up of Multiple Myeloma. She has just undergone autologous transplantation at [facility].  Kelli Hurley is unaccompanied. I personally reviewed and went over her post-transplant physician's notes with the patient.  She is feeling well. She was told to start her potassium again by Thomas Kefalas, PA-C. Appetite and mood are good. She enjoys things and gets out some wearing a face mask. She is careful while at home. Her hands and feet are good. Denies breathing issues, she has not had to use her inhaler or oxygen. She has not experienced any issue having her blood work performed. Denies bowel issues or diarrhea. She has been cooking and eating at home.    PAST MEDICAL HISTORY:   Past Medical History  Diagnosis Date  . Hypertension   . Anemia   . Normocytic hypochromic anemia 08/03/2014  . Leukopenia 08/03/2014  . Claustrophobia 10/05/2014    ALLERGIES: No Known Allergies    MEDICATIONS: I have reviewed the patient's current medications.    Current Outpatient Prescriptions on File Prior to Visit  Medication Sig Dispense Refill  . acyclovir (ZOVIRAX) 400 MG tablet TAKE 1 TABLET BY MOUTH TWICE DAILY 60 tablet 2  . folic acid (FOLVITE) 1 MG tablet Take 1 mg by mouth.    . hydrochlorothiazide (HYDRODIURIL) 25 MG tablet Take 25 mg by mouth every morning.    . Magnesium Chloride-Calcium 64-106 MG TBEC Take 3 tablets by mouth daily. 90 tablet 1  . Multiple Vitamin (MULTIVITAMIN) tablet Take by mouth.     . potassium chloride SA (K-DUR,KLOR-CON) 20 MEQ tablet Take 1 tablet (20 mEq total) by mouth 3 (three) times daily. 90 tablet 3  . albuterol (PROVENTIL HFA;VENTOLIN HFA) 108 (90 BASE) MCG/ACT inhaler Inhale 1 puff into the lungs every 6 (six) hours as needed for wheezing or shortness of breath. Reported on 07/02/2015    . ANORO ELLIPTA 62.5-25 MCG/INH AEPB Reported on 06/03/2015    . diazepam (VALIUM) 5 MG tablet Take 1 tab 1 hour prior to procedure. Then take 1-2 tablets when you arrive if needed. (Patient not taking: Reported on 07/02/2015) 5 tablet 0  . ibuprofen (ADVIL,MOTRIN) 200 MG tablet Take 200 mg by mouth every 6 (six) hours as needed. Reported on 07/02/2015    . ondansetron (ZOFRAN) 8 MG tablet Take 8 mg by mouth every 8 (eight) hours as needed for nausea or vomiting. Reported on 07/02/2015    . prochlorperazine (COMPAZINE) 10 MG tablet Take 10 mg by mouth every 6 (six) hours as needed for nausea or vomiting. Reported on 07/02/2015     Current Facility-Administered Medications on File Prior to Visit  Medication Dose Route Frequency Provider Last Rate Last Dose  . heparin lock flush 100 unit/mL  500 Units Intravenous Once Shannon K Penland, MD      . sodium chloride 0.9 % injection 10 mL  10 mL Intravenous Once Shannon K Penland, MD         PAST   SURGICAL HISTORY Past Surgical History  Procedure Laterality Date  . Abdominal hysterectomy    . Other surgical history      heart surgery as infant to "repair hole in heart"    FAMILY HISTORY: Family History  Problem Relation Age of Onset  . Cancer Mother   . Hypertension Mother   . Cancer Father   . Hypertension Father   . Cancer Maternal Grandmother     SOCIAL HISTORY: She reports that she used to smoke as a teenager, but quit > 40 years ago.  She denies EtOH abuse.  She denies illicit drug abuse.  She used to work in textiles being a machine operator, but now she babysits her grandbaby who is 9 months old.  She live byherself.  She  is divorced with two daughters ages 32 and 36 who are healthy.  Her grandbaby is healthy too.  She is [employer].  PERFORMANCE STATUS: The patient's performance status is 1 - Symptomatic but completely ambulatory  ROS: Gastrointestinal: Negative for diarrhea, constipation. Neurological: Negative for neuropathy. 14 point review of systems was performed and is negative except as detailed under history of present illness and above  PHYSICAL EXAM: Most Recent Vital Signs: Blood pressure 127/82, pulse 79, temperature 98.1 F (36.7 C), temperature source Oral, resp. rate 18, weight 298 lb (135.172 kg), SpO2 100 %. General appearance: alert, cooperative, appears stated age, no distress and morbidly obese. Assisted onto exam table. Alopecia Head: Normocephalic, without obvious abnormality, atraumatic Eyes: conjunctivae/corneas clear. PERRL, EOM's intact. Fundi benign. Throat: lips, mucosa, and tongue normal; teeth and gums normal Neck: no adenopathy, supple, symmetrical, trachea midline and thyroid not enlarged, symmetric, no tenderness/mass/nodules Back: symmetric, no curvature. ROM normal. No CVA tenderness. Lungs: clear to auscultation bilaterally and normal percussion bilaterally Heart: regular rate and rhythm, S1, S2 normal, no murmur, click, rub or gallop Abdomen: soft, non-tender; bowel sounds normal; no masses,  no organomegaly Extremities: extremities normal, atraumatic, no cyanosis edema at the ankles bilaterally. Right lower extremity greater than left lower extremity Skin: Skin color, texture, turgor normal. No rashes or lesions Lymph nodes: Cervical, supraclavicular, and axillary nodes normal. Neurologic: Alert and oriented X 3, normal strength and tone. Normal symmetric reflexes. Normal coordination and gait   LABORATORY DATA:  I have reviewed the data as listed. Results for Shibuya, Thao C (MRN [id number]) as of 07/02/2015 13:02  Ref. Range 07/02/2015 12:08  Sodium Latest Ref  Range: 135-145 mmol/L 137  Potassium Latest Ref Range: 3.5-5.1 mmol/L 4.3  Chloride Latest Ref Range: 101-111 mmol/L 105  CO2 Latest Ref Range: 22-32 mmol/L 26  BUN Latest Ref Range: 6-20 mg/dL 16  Creatinine Latest Ref Range: 0.44-1.00 mg/dL 0.98  Calcium Latest Ref Range: 8.9-10.3 mg/dL 9.5  EGFR (Non-African Amer.) Latest Ref Range: >60 mL/min >60  EGFR (African American) Latest Ref Range: >60 mL/min >60  Glucose Latest Ref Range: 65-99 mg/dL 95  Anion gap Latest Ref Range: 5-15  6  Magnesium Latest Ref Range: 1.7-2.4 mg/dL 1.8  Alkaline Phosphatase Latest Ref Range: 38-126 U/L 41  Albumin Latest Ref Range: 3.5-5.0 g/dL 4.2  AST Latest Ref Range: 15-41 U/L 17  ALT Latest Ref Range: 14-54 U/L 12 (L)  Total Protein Latest Ref Range: 6.5-8.1 g/dL 7.5  Total Bilirubin Latest Ref Range: 0.3-1.2 mg/dL 0.6  WBC Latest Ref Range: 4.0-10.5 K/uL 4.4  RBC Latest Ref Range: 3.87-5.11 MIL/uL 3.82 (L)  Hemoglobin Latest Ref Range: 12.0-15.0 g/dL 10.6 (L)  HCT Latest Ref Range: 36.0-46.0 % 32.3 (  L)  MCV Latest Ref Range: 78.0-100.0 fL 84.6  MCH Latest Ref Range: 26.0-34.0 pg 27.7  MCHC Latest Ref Range: 30.0-36.0 g/dL 32.8  RDW Latest Ref Range: 11.5-15.5 % 16.9 (H)  Platelets Latest Ref Range: 150-400 K/uL 206  Neutrophils Latest Units: % 57  Lymphocytes Latest Units: % 31  Monocytes Relative Latest Units: % 8  Eosinophil Latest Units: % 4  Basophil Latest Units: % 0  NEUT# Latest Ref Range: 1.7-7.7 K/uL 2.5  Lymphocyte # Latest Ref Range: 0.7-4.0 K/uL 1.4  Monocyte # Latest Ref Range: 0.1-1.0 K/uL 0.4  Eosinophils Absolute Latest Ref Range: 0.0-0.7 K/uL 0.2  Basophils Absolute Latest Ref Range: 0.0-0.1 K/uL 0.0   Bone Marrow BiopsyS Diagnosis Bone Marrow, Aspirate,Biopsy, and Clot - NORMOCELLULAR BONE MARROW FOR AGE WITH PLASMA CELL NEOPLASM. - TRILINEAGE HEMATOPOIESIS. - SEE COMMENT. PERIPHERAL BLOOD: - NORMOCYTIC-NORMOCHROMIC ANEMIA - LEUKOPENIA Diagnosis Note Despite limited  aspirate material, the plasma cell component is increased in the marrow representing an estimated 18% of all cells with associated atypical cytomorphologic features. Immunohistochemical stains show the the plasma cells are kappa light chain-restricted consistent with plasma cell neoplasm. The background shows trilineage hematopoiesis with non specific changes. Correlation with cytogenetic and FISH studies is recommended. (BNS:ds/gt, 09/04/14) BASSAM SMIR MD Pathologist, Electronic Signature (Case signed 09/07/2014)   RADIOGRAPHIC STUDIES: I have reviewed the images below and agree with the results:  Result Impression   No FDG avid osseous or soft tissue lesions are identified. Ancillary CT findings as above.   Result Narrative  PET MULTIPLE MYELOMA (W/LOW DOSE CT), 04/07/2015 10:46 AM  INDICATION: Evaluation for stem cell transplant COMPARISON: Outside facility PET/CT 09/30/2014  TECHNIQUE: 79 minutes after the intravenous injection of 13.218 mCi F-18 FDG, images were obtained from the vertex through the toes. These images were attenuation corrected using CT. Standardized uptake values (SUV) were calculated using a lean body mass algorithm. Blood glucose at the time of injection was 81 mg/dL.  [facility] and its affiliates are committed to minimizing radiation dose to patients while maintaining necessary diagnostic image quality. All CT scans are therefore performed using "As Low As Reasonably Achievable (ALARA)" protocols with either manual or automated exposure controls calibrated to the age and size of each patient.  LIMITATIONS: The low-dose CT acquisition was performed only for attenuation correction/activity localization. There is no intravenous contrast, further limiting the CT component of the study. Patient body habitus further limits diagnostic quality of the CT portion of the exam. This modality has limited utility for detection or characterization of  small lung nodules. Evaluation of the vasculature is limited by lack of IV contrast. Evaluation of the kidney, ureters, and bladder are limited by urinary excretion of radiotracer. Physiologic bowel uptake of FDG and lack of CT contrast limit evaluation of the bowel.   FINDINGS:   HEAD and NECK:     No abnormal FDG uptake is seen in the face, orbits, sinuses, oral cavity, or thyroid. Ancillary head and neck CT findings: None.              CHEST:     No abnormal FDG uptake is seen in the heart, lungs, pleura, esophagus, hila/mediastinum, axilla, or breasts. Ancillary chest CT findings: None.              ABDOMEN/PELVIS:     No abnormal FDG uptake is seen in the liver, spleen, gallbladder, pancreas, adrenals, peritoneum, extraperitoneum, nodes, or reproductive tract. Ancillary abdomen and pelvis CT findings: None.                MUSCULOSKELETAL:     No abnormal FDG uptake is seen in the soft tissues or bones. Ancillary musculoskeletal CT findings: Bilateral os acromiale. Degenerative changes in the lower back, SI joints, hips, and knees. Calcific tendinitis of the right Achilles tendon.         Expected physiologic activity within the kidneys, ureter, bladder, oropharynx, salivary glands, stomach, bowel and brain.    PATHOLOGY:  ACCESSION NUMBER: [id number] RECEIVED: 04/07/2015 ORDERING PHYSICIAN: CESAR [name] , MD PATIENT NAME: Vela Prose BONE MARROW REPORT   Bone Marrow (BM) and Peripheral Blood (PB) FINAL PATHOLOGIC DIAGNOSIS  BONE MARROW: Normocellular marrow (40%) with approximately 2-4% plasma cells. See comment.  PERIPHERAL BLOOD: Mild normocytic anemia and leucopenia. No rouleaux formation. See CBC data.  COMMENT: The bone marrow aspirate smears are paucispicular, paucicellular and adequate for evaluation. There is no increase in plasma cells (1% by manual differential counts). Blasts are not increased in numbers (less than 1% by manual differential  counts). The myeloid and erythroid elements are present in normal proportions with an M: E ratio of 1.8:1. There are no overt dysplastic features of the myeloid or erythroid lineages. Megakaryocytes are quantitatively and qualitatively unremarkable.  Examination of the bone marrow core biopsy is hampered by aspiration artifact, but reveals an overall normocellular marrow for the patient's age (40%). Myeloid and erythroid elements are present in normal proportions. Megakaryocytes are morphologically unremarkable and are present without clustering. There are no atypical lymphoid aggregates or granulomatous formations identified. CD138 immunohistochemistry reveals approximately 2-4% plasma cells. In situ hybridization could not be performed on the core biopsy. No plasma cell clusters are seen. Examination of the clot sections reveals mostly peripheral blood with a single bone marrow spicule.  The peripheral blood smears reveal no rouleaux formation. The peripheral blood leukocytes are morphologically unremarkable.  The positive immunohistochemical controls were performed and worked appropriately.  CYTOGENETICS Date Ordered: 04/07/2015 Date Reported: 04/13/2015 Interpretation  Laboratory Analysis:  GTG-banded Metaphases: # Cells Karyotyped: Band Resolution:   Molecular Cytogenetic Analysis: (chromosomes pending)   .nuc ish (FGFR3,IgH)x2,(CCND1,IgH)x2,(CEP12,D13S319,LAMP1)x2,(ATM,p53)x2   Molecular Cytogenetic Analysis - FISH: Normal: The investigative technique of molecular-cytogenetic analysis was performed per physician's request using a DNA probe panel specific for regions associated with chromosomal abnormalities of Multiple Myeloma involving loci of chromosomes 4, 11, 12, 13, 14, and 17 on interphase nuclei.  0% of cells showed no fusion, gain/loss of IGH/FGFR3  This result is below the cut-off value of 2% and is technically negative.    ASSESSMENT/PLAN:    Multiple Myeloma IgG kappa, STAGE II, serum albumin 3.5 g/dl IgG of 04/14/2007 MG/DL, total M spike of 2.7 g/DL Beta-2 microglobulin of 3.7 MG/L Severe Anemia at presentation BMBX with 18% monoclonal plasma cells, kappa light chain restsricted. 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 She looks great.  She has a good understanding of the importance of following all recommendations in regards to infection prevention.   Dr. Debbrah Alar,  wants her to take her Bactrim MWF and Acyclovir twice daily. Follow up with medical oncology monthly. At her last visit she was to finish her potassium in 2 weeks. They will see her back in July.  She has restarted potassium secondary to a recent K of 3.3.  Today 06/07/15 is day +31 after your autologous hematopoietic stem cell transplant (auto-HSCT). We are happy to see that you are feeling well. Please continue to avoid sick contacts, salads at restaurants, open buffets, etc. as you have been doing -- at least until we see you back in clinic.  The next steps are to:  1. Start taking sulfamethoxazole-trimethoprim (Bactrim) on Monday, Wednesday, and Friday to reduce your risk of atypical/pneumocystis lung infections. 2. Please continue taking acyclovir to reduce your risk of viral infections after transplant. 3. Please follow up with Dr. Penland once per month for follow-up. 4. You can reduce potassium to once per day and you can stop taking it in two weeks.  And we will see you back in clinic in July.   If you have any questions/concerns please call our  clinic.  Cesar Rodriguez Valdes, MD Hematology/Oncology Attending  She does not need any refills at this time.   She will return in 1 month for follow up.   All questions were answered. The patient knows to call the clinic with any problems, questions or concerns. We can certainly see the patient much sooner if necessary.   This document serves as a record of services personally performed by Shannon Penland, MD. It was created on her behalf by Elizabeth Ashley, a trained medical scribe. The creation of this record is based on the scribe's personal observations and the provider's statements to them. This document has been checked and approved by the attending provider.  I have reviewed the above documentation for accuracy and completeness, and I agree with the above.  This note is electronically signed.  Shannon K. Penland, MD   

## 2015-07-04 ENCOUNTER — Encounter (HOSPITAL_COMMUNITY): Payer: Self-pay | Admitting: Hematology & Oncology

## 2015-07-09 ENCOUNTER — Encounter (HOSPITAL_COMMUNITY): Payer: Medicare Other | Attending: Oncology

## 2015-07-09 DIAGNOSIS — C9000 Multiple myeloma not having achieved remission: Secondary | ICD-10-CM

## 2015-07-09 DIAGNOSIS — D509 Iron deficiency anemia, unspecified: Secondary | ICD-10-CM | POA: Insufficient documentation

## 2015-07-09 DIAGNOSIS — D72819 Decreased white blood cell count, unspecified: Secondary | ICD-10-CM | POA: Diagnosis not present

## 2015-07-09 LAB — CBC WITH DIFFERENTIAL/PLATELET
Basophils Absolute: 0 10*3/uL (ref 0.0–0.1)
Basophils Relative: 0 %
Eosinophils Absolute: 0.2 10*3/uL (ref 0.0–0.7)
Eosinophils Relative: 3 %
HEMATOCRIT: 33.3 % — AB (ref 36.0–46.0)
Hemoglobin: 11.1 g/dL — ABNORMAL LOW (ref 12.0–15.0)
LYMPHS PCT: 26 %
Lymphs Abs: 1.4 10*3/uL (ref 0.7–4.0)
MCH: 28.2 pg (ref 26.0–34.0)
MCHC: 33.3 g/dL (ref 30.0–36.0)
MCV: 84.7 fL (ref 78.0–100.0)
MONO ABS: 0.5 10*3/uL (ref 0.1–1.0)
MONOS PCT: 9 %
NEUTROS ABS: 3.2 10*3/uL (ref 1.7–7.7)
Neutrophils Relative %: 62 %
Platelets: 221 10*3/uL (ref 150–400)
RBC: 3.93 MIL/uL (ref 3.87–5.11)
RDW: 16.5 % — AB (ref 11.5–15.5)
WBC: 5.2 10*3/uL (ref 4.0–10.5)

## 2015-07-09 LAB — COMPREHENSIVE METABOLIC PANEL
ALBUMIN: 4.3 g/dL (ref 3.5–5.0)
ALT: 11 U/L — ABNORMAL LOW (ref 14–54)
ANION GAP: 8 (ref 5–15)
AST: 18 U/L (ref 15–41)
Alkaline Phosphatase: 45 U/L (ref 38–126)
BILIRUBIN TOTAL: 0.4 mg/dL (ref 0.3–1.2)
BUN: 16 mg/dL (ref 6–20)
CHLORIDE: 105 mmol/L (ref 101–111)
CO2: 24 mmol/L (ref 22–32)
Calcium: 9.3 mg/dL (ref 8.9–10.3)
Creatinine, Ser: 1.05 mg/dL — ABNORMAL HIGH (ref 0.44–1.00)
GFR calc Af Amer: >60 mL/min (ref >60)
GFR calc non Af Amer: 57 mL/min — ABNORMAL LOW (ref >60)
GLUCOSE: 114 mg/dL — AB (ref 65–99)
POTASSIUM: 4.1 mmol/L (ref 3.5–5.1)
Sodium: 137 mmol/L (ref 135–145)
TOTAL PROTEIN: 7.5 g/dL (ref 6.5–8.1)

## 2015-07-09 LAB — MAGNESIUM: MAGNESIUM: 1.8 mg/dL (ref 1.7–2.4)

## 2015-07-12 DIAGNOSIS — M170 Bilateral primary osteoarthritis of knee: Secondary | ICD-10-CM | POA: Diagnosis not present

## 2015-07-12 DIAGNOSIS — M25562 Pain in left knee: Secondary | ICD-10-CM | POA: Diagnosis not present

## 2015-07-12 DIAGNOSIS — M25561 Pain in right knee: Secondary | ICD-10-CM | POA: Diagnosis not present

## 2015-07-16 ENCOUNTER — Encounter (HOSPITAL_COMMUNITY): Payer: Medicare Other

## 2015-07-16 DIAGNOSIS — D509 Iron deficiency anemia, unspecified: Secondary | ICD-10-CM | POA: Diagnosis not present

## 2015-07-16 DIAGNOSIS — C9000 Multiple myeloma not having achieved remission: Secondary | ICD-10-CM

## 2015-07-16 DIAGNOSIS — D72819 Decreased white blood cell count, unspecified: Secondary | ICD-10-CM | POA: Diagnosis not present

## 2015-07-16 LAB — COMPREHENSIVE METABOLIC PANEL
ALK PHOS: 45 U/L (ref 38–126)
ALT: 12 U/L — AB (ref 14–54)
AST: 15 U/L (ref 15–41)
Albumin: 4.3 g/dL (ref 3.5–5.0)
Anion gap: 5 (ref 5–15)
BILIRUBIN TOTAL: 0.7 mg/dL (ref 0.3–1.2)
BUN: 18 mg/dL (ref 6–20)
CALCIUM: 9.4 mg/dL (ref 8.9–10.3)
CO2: 25 mmol/L (ref 22–32)
CREATININE: 0.89 mg/dL (ref 0.44–1.00)
Chloride: 106 mmol/L (ref 101–111)
Glucose, Bld: 90 mg/dL (ref 65–99)
Potassium: 4.3 mmol/L (ref 3.5–5.1)
Sodium: 136 mmol/L (ref 135–145)
Total Protein: 7.7 g/dL (ref 6.5–8.1)

## 2015-07-16 LAB — CBC WITH DIFFERENTIAL/PLATELET
BASOS PCT: 0 %
Basophils Absolute: 0 10*3/uL (ref 0.0–0.1)
Eosinophils Absolute: 0.2 10*3/uL (ref 0.0–0.7)
Eosinophils Relative: 3 %
HEMATOCRIT: 33.3 % — AB (ref 36.0–46.0)
Hemoglobin: 11.4 g/dL — ABNORMAL LOW (ref 12.0–15.0)
LYMPHS ABS: 1.3 10*3/uL (ref 0.7–4.0)
LYMPHS PCT: 24 %
MCH: 28.7 pg (ref 26.0–34.0)
MCHC: 34.2 g/dL (ref 30.0–36.0)
MCV: 83.9 fL (ref 78.0–100.0)
MONO ABS: 0.6 10*3/uL (ref 0.1–1.0)
MONOS PCT: 11 %
NEUTROS ABS: 3.4 10*3/uL (ref 1.7–7.7)
Neutrophils Relative %: 62 %
Platelets: 229 10*3/uL (ref 150–400)
RBC: 3.97 MIL/uL (ref 3.87–5.11)
RDW: 16.1 % — AB (ref 11.5–15.5)
WBC: 5.5 10*3/uL (ref 4.0–10.5)

## 2015-07-16 LAB — MAGNESIUM: MAGNESIUM: 2 mg/dL (ref 1.7–2.4)

## 2015-07-19 DIAGNOSIS — M25562 Pain in left knee: Secondary | ICD-10-CM | POA: Diagnosis not present

## 2015-07-19 DIAGNOSIS — M1712 Unilateral primary osteoarthritis, left knee: Secondary | ICD-10-CM | POA: Diagnosis not present

## 2015-07-20 ENCOUNTER — Encounter (HOSPITAL_BASED_OUTPATIENT_CLINIC_OR_DEPARTMENT_OTHER): Payer: Medicare Other | Admitting: Adult Health

## 2015-07-20 ENCOUNTER — Encounter: Payer: Self-pay | Admitting: *Deleted

## 2015-07-20 VITALS — BP 115/75 | HR 77 | Temp 98.4°F | Resp 16 | Wt 298.6 lb

## 2015-07-20 DIAGNOSIS — C9000 Multiple myeloma not having achieved remission: Secondary | ICD-10-CM

## 2015-07-20 DIAGNOSIS — Z9484 Stem cells transplant status: Secondary | ICD-10-CM

## 2015-07-20 NOTE — Progress Notes (Signed)
  [place] CLINICAL SOCIAL WORK PSYCHOSOCIAL ASSESSMENT   Date:  07/20/2015   First Name: Kelli Hurley.: C     Last Name: Kelli Hurley MRN:  [id number]  The patient was referred by [facility] to assess for psychosocial, emotional, spiritual, and practical needs.     Primary Cancer Type: Multiple myeloma ([place])   Staging form: Multiple Myeloma, AJCC 6th Edition     Clinical stage from 10/26/2014: Stage IIA - Signed by Baird Cancer, PA-C on 10/26/2014         Marital Status: Legally Separated   CSW met with pt in [facility] as part of a joint visit with Mike Craze, NP.  Practical Problems: None Identified     Employment:   on disability   Source of Income: Long-Term Disability    Insurance: medicare   Family Problems:   None Identified Concerns caring for family needs: None Identified    Emotional Problems: None Identified  Concerns of Adjustment to Diagnosis/Treatment: None Identified   Current Symptoms of Anxiety: No symptoms identified   Current Symptoms of Depression: No symptoms identified  Safety/Risk Concerns: None identified   Mental Status Exam Orientation: person, place, and time Affect: appropriate Thought: normal      Spiritual/Religious: None identified  Living Situation: alone  Functional Status: Independent                                                                                           Strengths and Barriers To Treatment:   Patient Coping Strengths:   Supportive Relationships, Family, Friends, Church, Hopefulness, Conservator, museum/gallery and Able to Communicate Effectively                                                                                                  Identified Problems/Needs and Barriers to Care:  No identified problems    Counseling and Social Work Interventions and Recommendations:   Management consultant Group/Group Counseling   Impressions/Plan: This was my second  time speaking with pt today. Pt reports to have strong support from family, friends and through her church community. She appears to be doing amazingly well after her transplant. She denied current anxiety and depression or other common survivorship issues. CSW reviewed options for additional supports and groups at [facility], [facility] and in the community that are available to her as she continues into survivorship. Pt educated on CSW role and how to contact as needs arise.   Loren Racer, [place] [employer]   [phone]

## 2015-07-21 DIAGNOSIS — M25561 Pain in right knee: Secondary | ICD-10-CM | POA: Diagnosis not present

## 2015-07-21 DIAGNOSIS — M1711 Unilateral primary osteoarthritis, right knee: Secondary | ICD-10-CM | POA: Diagnosis not present

## 2015-07-22 ENCOUNTER — Encounter (HOSPITAL_COMMUNITY): Payer: Self-pay | Admitting: Adult Health

## 2015-07-22 NOTE — Progress Notes (Signed)
[place], [place] [id number]   CLINIC:  Survivorship  REASON FOR VISIT:  To address acute survivorship needs with Survivorship NP & Oncology Clinical Social Worker   BRIEF ONCOLOGY HISTORY:    Multiple myeloma ([place])   08/07/2014 Imaging Bone Survey- No lytic lesions are noted in the visualized skeleton.   09/03/2014 Bone Marrow Biopsy NORMOCELLULAR BONE MARROW FOR AGE WITH PLASMA CELL NEOPLASM.  The plasma cell component is increased in the marrow representing an estimated 18% of all cells. Cytogenetics with 13q-, 17p- (high risk disease)   09/03/2014 Pathology Results Cytogenetics with 13q-, 17p- (high risk disease)   09/23/2014 Initial Diagnosis Multiple myeloma   09/30/2014 PET scan No abnormal hypermetabolism in the neck, chest, abdomen or pelvis.   10/05/2014 -  Chemotherapy RVD   10/28/2014 Treatment Plan Change Issues related to getting Revlimid in a timely fashion, therefore, she received her Revlimid on 9/21 resulting in a 12 day cycle this time instead of a 14 day cycle   11/02/2014 Imaging CTA chest- No evidence for a large or central pulmonary embolism as described.  8 mm density along the right minor fissure could represent focal pleural thickening but indeterminate. If the patient is at high risk for bronchogenic carcinoma, follow-up c   11/23/2014 Miscellaneous Zometa 4 mg IV monthly   04/07/2015 Bone Marrow Biopsy Normocellular marrow with 2-4% clonal plasma cells by immunohistochemistry. FISH and cytogenetics were normal [facility])     04/2015 Miscellaneous PRETRANSPLANT EVALUATION:  Pulmonary function tests: FEV1 100.3% / DLCO 97.9%  Echocardiogram: Normal LV function with EF 60-65%    04/27/2015 Procedure Stem cell mobilization with filgrastim and Mozobil [facility])   05/06/2015 Miscellaneous BMT conditioning regimen with high-dose Melphalan given [facility], [name]); Day -1   05/07/2015 Bone Marrow Transplant Outpatient autologous stem cell  transplant [facility], [name]); Day 0   05/18/2015 Miscellaneous WBC engraftment;  did not require platelet transfusion during her transplant process. Lowest platelet count 28,000    05/20/2015 Procedure Tunneled catheter removed [facility])     INTERVAL HISTORY:  Kelli Hurley presents to the [facility] today for our initial meeting to assess any acute survivorship concerns and ensure she has access to available resources as a cancer survivor.   Today is Day +74 since her outpatient autologous stem cell transplant at [facility].  Her last BMT visit with Dr. Norma Fredrickson was on 06/07/15.  She remains on her antifungal and antibacterial prophylaxis with Acyclovir BID and Bactrim on Mondays/Wednesdays/Fridays.  She is tolerating her medications well. She is scheduled to go back to [facility] for her Day +100 evaluation with repeat bone marrow biopsy and PET scan.  She denies any new focal bone pain; she denies any pain whatsoever.  She has been able to get out of the house some while wearing a mask. Her breathing has been good; she has not required any of her asthma inhalers or oxygen.  She actually has called to have the home O2 picked up from her house because she doesn't feel like she needs it anymore.  She tells me that she feels great! She has 2 daughters that are very involved in her care.  One of her daughters works as a Quarry manager here at [place], LCSW was present during much of today's visit as well.   ADDITIONAL REVIEW OF SYSTEMS:  Review of Systems  Constitutional: Negative for fever and chills.  She checks her temperature at home as directed   HENT: Negative for nosebleeds and sore throat.   Eyes: Negative for blurred vision.  Respiratory: Negative for cough and shortness of breath.   Cardiovascular: Negative for chest pain.  Gastrointestinal: Negative for nausea, vomiting, abdominal pain, diarrhea, constipation and blood in stool.    Genitourinary: Negative for dysuria and hematuria.  Musculoskeletal: Negative for joint pain.  Skin: Negative for rash.  Neurological: Negative for dizziness and headaches.  Endo/Heme/Allergies: Does not bruise/bleed easily.       No bleeding   Psychiatric/Behavioral: Negative for depression. The patient is not nervous/anxious and does not have insomnia.      PAST MEDICAL & SURGICAL HISTORY:  Past Medical History  Diagnosis Date  . Hypertension   . Anemia   . Normocytic hypochromic anemia 08/03/2014  . Leukopenia 08/03/2014  . Claustrophobia 10/05/2014   Past Surgical History  Procedure Laterality Date  . Abdominal hysterectomy    . Other surgical history      heart surgery as infant to "repair hole in heart"     SOCIAL HISTORY: Kelli Hurley lives alone in [place]. She has 2 daughters, 1 who works at [employer] as a Quarry manager.  She has a 57-year-old grandson who she enjoyed babysitting prior to her transplant and is looking forward to being able to keep him again.  She has been on disability for quite some time, but previously worked at the [employer].    CURRENT MEDICATIONS:  Current Outpatient Prescriptions on File Prior to Visit  Medication Sig Dispense Refill  . acyclovir (ZOVIRAX) 400 MG tablet TAKE 1 TABLET BY MOUTH TWICE DAILY 60 tablet 2  . albuterol (PROVENTIL HFA;VENTOLIN HFA) 108 (90 BASE) MCG/ACT inhaler Inhale 1 puff into the lungs every 6 (six) hours as needed for wheezing or shortness of breath. Reported on 07/02/2015    . ANORO ELLIPTA 62.5-25 MCG/INH AEPB Reported on 06/03/2015    . diazepam (VALIUM) 5 MG tablet Take 1 tab 1 hour prior to procedure. Then take 1-2 tablets when you arrive if needed. (Patient not taking: Reported on 07/02/2015) 5 tablet 0  . folic acid (FOLVITE) 1 MG tablet Take 1 mg by mouth.    . hydrochlorothiazide (HYDRODIURIL) 25 MG tablet Take 25 mg by mouth every morning.    Marland Kitchen ibuprofen (ADVIL,MOTRIN) 200 MG tablet Take 200 mg by mouth every 6  (six) hours as needed. Reported on 07/02/2015    . Magnesium Chloride-Calcium 64-106 MG TBEC Take 3 tablets by mouth daily. 90 tablet 1  . Multiple Vitamin (MULTIVITAMIN) tablet Take by mouth.    . ondansetron (ZOFRAN) 8 MG tablet Take 8 mg by mouth every 8 (eight) hours as needed for nausea or vomiting. Reported on 07/02/2015    . potassium chloride SA (K-DUR,KLOR-CON) 20 MEQ tablet Take 1 tablet (20 mEq total) by mouth 3 (three) times daily. 90 tablet 3  . prochlorperazine (COMPAZINE) 10 MG tablet Take 10 mg by mouth every 6 (six) hours as needed for nausea or vomiting. Reported on 07/02/2015     Current Facility-Administered Medications on File Prior to Visit  Medication Dose Route Frequency Provider Last Rate Last Dose  . heparin lock flush 100 unit/mL  500 Units Intravenous Once Patrici Ranks, MD      . sodium chloride 0.9 % injection 10 mL  10 mL Intravenous Once Patrici Ranks, MD        ALLERGIES: No Known Allergies  PHYSICAL EXAM:  Filed  Vitals:   07/20/15 1200  BP: 115/75  Pulse: 77  Temp: 98.4 F (36.9 C)  Resp: 16   Filed Weights   07/20/15 1200  Weight: 298 lb 9.6 oz (135.444 kg)    General: Well-nourished, well-appearing female in no acute distress.  Unaccompanied today. HEENT: Head is normocephalic.  Pupils equal and reactive to light. Conjunctivae clear without exudate.  Sclerae anicteric. Oral mucosa is pink and moist without lesions. Oropharynx is pink and moist without lesions. No mucositis.  Lymph: No cervical, supraclavicular, or infraclavicular lymphadenopathy noted on palpation.   Cardiovascular: Normal rate and rhythm Respiratory: Clear to auscultation bilaterally. Chest expansion symmetric without accessory muscle use. Breathing non-labored.    GU: Deferred.   GI: Soft, non-tender abdomen. Normoactive bowel sounds.  Neuro: No focal deficits. Steady gait.   Psych: Normal mood and affect for situation. Extremities: No edema.   Skin: Warm and dry.    LABORATORY DATA: None for this visit.   DIAGNOSTIC IMAGING:  None for this visit.    ASSESSMENT & PLAN:  Kelli Hurley is a very pleasant 60 y.o. female with history of Stage II Multiple Myeloma IgG kappa, treated with RVD and then autologous stem cell transplant; transplant occurred on 05/07/15.  Patient presents to survivorship clinic today for initial visit to address any acute survivorship concerns, as well as to connect her with appropriate resources.    1. Multiple myeloma s/p autologous stem cell transplant: Today is Day +74 and she is continuing to recover very well.  She will return to [facility] in 08/2015 for her Day +100 evaluation with restaging bone marrow biopsy and PET scan.   We discussed the follow-up schedule she can anticipate until her Day +100 visit.  Kelli Hurley will return to the survivorship clinic as needed; she will return to [facility] for surveillance visit with Manning Charity on 07/30/15 with labs.   2. Infection/Bleeding precautions:  She understands the importance of maintaining her prophylactic antibacterial and antiviral medications status post autologous stem cell transplant.  She is well engrafted and did not require platelet transfusion post-transplant. She will continue to check her temperature at home, avoid crowds/sick people, maintain current medication regimen, and monitor for any bleeding during this time of her recovery.  I shared with her that often Bactrim can lead to thrombocytopenia (which is part of the rationale for 3x/week dosing), so she should continue to be on bleeding precautions until otherwise instructed by her transplant team.   3. Smoking cessation: I commended Kelli Hurley's continued efforts to remain tobacco-free.  We discussed that one of the most important risk reduction strategies in preventing cancer recurrence or new cancers is smoking cessation.  She is committed to abstaining from tobacco.  4. Physical  activity/Healthy eating: Getting adequate physical activity and maintaining a healthy diet as a cancer survivor is important for overall wellness and reduces the risk of cancer recurrence. We discussed the [facility], which is a fitness program that is offered to cancer survivors free of charge.  We also reviewed "The Nutrition Rainbow" handout, as well as the American Cancer Society's booklet with recommendations for nutrition and physical activity.    5. Support services/Counseling: Kelli Hurley was seen today in conjunction with Abby Potash Hock,/ LCSW, in an effort to address both the physical and social concerns of our cancer survivors at [facility].  (Please see LCSW note for additional documentation & recommendations).  It is not uncommon for this period of  the patient's cancer care trajectory to be one of many emotions and stressors.  I provided support today through active listening, validation of concerns, and expressive supportive counseling.  Kelli Hurley was encouraged to take advantage of our support services programs and support groups to better cope in her new life as a cancer survivor after completing anti-cancer treatment.   Dispo:  -Return to survivorship clinic as needed; no additional follow-up needed at this time.  -Consider transitioning the patient to long-term survivorship, when clinically appropriate.   A total of 35 minutes was spent in face-to-face care of this patient, with greater than 50% of that time spent in counseling and care coordination.   Mike Craze, NP Survivorship Program [place] [phone]

## 2015-07-23 ENCOUNTER — Encounter (HOSPITAL_COMMUNITY): Payer: Medicare Other

## 2015-07-23 DIAGNOSIS — C9000 Multiple myeloma not having achieved remission: Secondary | ICD-10-CM

## 2015-07-23 DIAGNOSIS — D509 Iron deficiency anemia, unspecified: Secondary | ICD-10-CM | POA: Diagnosis not present

## 2015-07-23 DIAGNOSIS — D72819 Decreased white blood cell count, unspecified: Secondary | ICD-10-CM | POA: Diagnosis not present

## 2015-07-23 LAB — COMPREHENSIVE METABOLIC PANEL
ALBUMIN: 4 g/dL (ref 3.5–5.0)
ALK PHOS: 49 U/L (ref 38–126)
ALT: 11 U/L — ABNORMAL LOW (ref 14–54)
ANION GAP: 7 (ref 5–15)
AST: 15 U/L (ref 15–41)
BUN: 19 mg/dL (ref 6–20)
CALCIUM: 9.2 mg/dL (ref 8.9–10.3)
CO2: 26 mmol/L (ref 22–32)
Chloride: 106 mmol/L (ref 101–111)
Creatinine, Ser: 1.1 mg/dL — ABNORMAL HIGH (ref 0.44–1.00)
GFR calc non Af Amer: 54 mL/min — ABNORMAL LOW (ref >60)
Glucose, Bld: 84 mg/dL (ref 65–99)
POTASSIUM: 3.6 mmol/L (ref 3.5–5.1)
SODIUM: 139 mmol/L (ref 135–145)
TOTAL PROTEIN: 7 g/dL (ref 6.5–8.1)
Total Bilirubin: 0.7 mg/dL (ref 0.3–1.2)

## 2015-07-23 LAB — MAGNESIUM: Magnesium: 1.8 mg/dL (ref 1.7–2.4)

## 2015-07-26 DIAGNOSIS — M1712 Unilateral primary osteoarthritis, left knee: Secondary | ICD-10-CM | POA: Diagnosis not present

## 2015-07-26 DIAGNOSIS — M25562 Pain in left knee: Secondary | ICD-10-CM | POA: Diagnosis not present

## 2015-07-28 DIAGNOSIS — M25561 Pain in right knee: Secondary | ICD-10-CM | POA: Diagnosis not present

## 2015-07-28 DIAGNOSIS — M1711 Unilateral primary osteoarthritis, right knee: Secondary | ICD-10-CM | POA: Diagnosis not present

## 2015-07-29 ENCOUNTER — Other Ambulatory Visit (HOSPITAL_COMMUNITY): Payer: Self-pay | Admitting: Oncology

## 2015-07-30 ENCOUNTER — Encounter (HOSPITAL_COMMUNITY): Payer: Self-pay | Admitting: Oncology

## 2015-07-30 ENCOUNTER — Encounter (HOSPITAL_BASED_OUTPATIENT_CLINIC_OR_DEPARTMENT_OTHER): Payer: Medicare Other | Admitting: Oncology

## 2015-07-30 ENCOUNTER — Encounter (HOSPITAL_COMMUNITY): Payer: Medicare Other

## 2015-07-30 VITALS — BP 119/70 | HR 90 | Temp 98.7°F | Resp 18 | Wt 300.3 lb

## 2015-07-30 DIAGNOSIS — D72819 Decreased white blood cell count, unspecified: Secondary | ICD-10-CM | POA: Diagnosis not present

## 2015-07-30 DIAGNOSIS — C9001 Multiple myeloma in remission: Secondary | ICD-10-CM | POA: Diagnosis not present

## 2015-07-30 DIAGNOSIS — D649 Anemia, unspecified: Secondary | ICD-10-CM | POA: Diagnosis not present

## 2015-07-30 DIAGNOSIS — D509 Iron deficiency anemia, unspecified: Secondary | ICD-10-CM | POA: Diagnosis not present

## 2015-07-30 LAB — COMPREHENSIVE METABOLIC PANEL
ALBUMIN: 4.3 g/dL (ref 3.5–5.0)
ALK PHOS: 53 U/L (ref 38–126)
ALT: 13 U/L — AB (ref 14–54)
ANION GAP: 7 (ref 5–15)
AST: 19 U/L (ref 15–41)
BILIRUBIN TOTAL: 0.5 mg/dL (ref 0.3–1.2)
BUN: 20 mg/dL (ref 6–20)
CALCIUM: 9.3 mg/dL (ref 8.9–10.3)
CO2: 25 mmol/L (ref 22–32)
CREATININE: 0.9 mg/dL (ref 0.44–1.00)
Chloride: 106 mmol/L (ref 101–111)
GFR calc Af Amer: >60 mL/min (ref >60)
GFR calc non Af Amer: >60 mL/min (ref >60)
GLUCOSE: 93 mg/dL (ref 65–99)
Potassium: 4.3 mmol/L (ref 3.5–5.1)
SODIUM: 138 mmol/L (ref 135–145)
TOTAL PROTEIN: 7.4 g/dL (ref 6.5–8.1)

## 2015-07-30 LAB — CBC WITH DIFFERENTIAL/PLATELET
BASOS PCT: 0 %
Basophils Absolute: 0 10*3/uL (ref 0.0–0.1)
EOS ABS: 0.1 10*3/uL (ref 0.0–0.7)
Eosinophils Relative: 3 %
HEMATOCRIT: 33.9 % — AB (ref 36.0–46.0)
HEMOGLOBIN: 11.4 g/dL — AB (ref 12.0–15.0)
LYMPHS ABS: 1.3 10*3/uL (ref 0.7–4.0)
Lymphocytes Relative: 28 %
MCH: 28.7 pg (ref 26.0–34.0)
MCHC: 33.6 g/dL (ref 30.0–36.0)
MCV: 85.4 fL (ref 78.0–100.0)
MONOS PCT: 7 %
Monocytes Absolute: 0.3 10*3/uL (ref 0.1–1.0)
NEUTROS ABS: 3 10*3/uL (ref 1.7–7.7)
NEUTROS PCT: 62 %
Platelets: 212 10*3/uL (ref 150–400)
RBC: 3.97 MIL/uL (ref 3.87–5.11)
RDW: 15.6 % — ABNORMAL HIGH (ref 11.5–15.5)
WBC: 4.7 10*3/uL (ref 4.0–10.5)

## 2015-07-30 LAB — MAGNESIUM: Magnesium: 1.8 mg/dL (ref 1.7–2.4)

## 2015-07-30 NOTE — Assessment & Plan Note (Addendum)
IgG kappa multiple myeloma, Stage II, presenting with severe anemia.  Bone marrow aspiration and biopsy at time of diagnosis demonstrated 18% monoclonal plasma cells, kappa light chain restricted.  Cytogenetics demonstrated a 13q- and 17p- (high risk disease).  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 Debbrah Alar.  Oncology history is updated.  Labs today and weekly: CBC diff, CMET, Mg.  I personally reviewed and went over laboratory results with the patient.  The results are noted within this dictation.  If her K+ is WNL today, we could certainly stop this medication and monitor K+ moving forward.  Dr. Rossie Muskrat note in [place] is noted below: Today 06/07/15 is day +31 after your autologous hematopoietic stem cell transplant (auto-HSCT). We are happy to see that you are feeling well. Please continue to avoid sick contacts, salads at restaurants, open buffets, etc. as you have been doing -- at least until we see you back in clinic.  The next steps are to:  1. Start taking sulfamethoxazole-trimethoprim (Bactrim) on Monday, Wednesday, and Friday to reduce your risk of atypical/pneumocystis lung infections. 2. Please continue taking acyclovir to reduce your risk of viral infections after transplant. 3. Please follow up with [name] once per month for follow-up. 4. You can reduce potassium to once per day and you can stop taking it in two weeks.  And we will see you back in clinic in July.   If you have any questions/concerns please call our clinic.  Reola Calkins, MD Hematology/Oncology Attending  Continue with weekly labs until further directed by [facility].    She has an appointment on 08/23/2015 to see Dr. Norma Fredrickson with a BMBX.  Return in 4 weeks for follow-up.  This appointment, depending on how the patient is doing and Dr. Rossie Muskrat recommendations, can be moved out  further if indicated.

## 2015-07-30 NOTE — Patient Instructions (Signed)
[place] at [facility] Discharge Instructions  RECOMMENDATIONS MADE BY THE CONSULTANT AND ANY TEST RESULTS WILL BE SENT TO YOUR REFERRING PHYSICIAN.  Labs today.  If potassium is normal, we will hold potassium pills.  We will continue with weekly lab work and we will send those results to Dr. Norma Fredrickson. Follow-up with Dr. Norma Fredrickson as scheduled on 08/23/2015. Return to [place] for follow-up appointment in approximately 1 month.  This may change based upon Dr. Norma Fredrickson recommendations.  Thank you for choosing [place] at [facility] to provide your oncology and hematology care.  To afford each patient quality time with our provider, please arrive at least 15 minutes before your scheduled appointment time.   Beginning January 23rd 2017 lab work for the [name] will be done in the  Main lab at [facility] on 1st floor. If you have a lab appointment with the [facility] please come in thru the  Main Entrance and check in at the main information desk  You need to re-schedule your appointment should you arrive 10 or more minutes late.  We strive to give you quality time with our providers, and arriving late affects you and other patients whose appointments are after yours.  Also, if you no show three or more times for appointments you may be dismissed from the clinic at the providers discretion.     Again, thank you for choosing [facility].  Our hope is that these requests will decrease the amount of time that you wait before being seen by our physicians.       _____________________________________________________________  Should you have questions after your visit to [facility], please contact our office at [phone] between the hours of 8:30 a.m. and 4:30 p.m.  Voicemails left after 4:30 p.m. will not be returned until the following business day.  For prescription refill requests, have your pharmacy contact our  office.         Resources For Cancer Patients and their Caregivers ? American Cancer Society: Can assist with transportation, wigs, general needs, runs Look Good Feel Better.        4173875234 ? Cancer Care: Provides financial assistance, online support groups, medication/co-pay assistance.  1-800-813-HOPE 937-320-5627) ? [employer] Assists [place] cancer patients and their families through emotional , educational and financial support.  (314)427-6067 ? [name] Where to apply for food stamps, Medicaid and utility assistance. 475-884-4473 ? [name]: Transportation to medical appointments. 8284522365 ? Social Security Administration: May apply for disability if have a Stage IV cancer. 732-229-6370 2607926778 ? [name], Disability and Transit Services: Assists with nutrition, care and transit needs. [name] Support Programs: @10RELATIVEDAYS @ > Cancer Support Group  2nd Tuesday of the month 1pm-2pm, Journey Room  > Creative Journey  3rd Tuesday of the month 1130am-1pm, Journey Room  > Look Good Feel Better  1st Wednesday of the month 10am-12 noon, Journey Room (Call [place] to register [phone])

## 2015-07-30 NOTE — Progress Notes (Signed)
[name], PA-C [address] [place] [address]  Multiple myeloma in remission [facility]) - Plan: CBC with Differential, Comprehensive metabolic panel, Magnesium, CBC with Differential, Comprehensive metabolic panel, Magnesium  CURRENT THERAPY: Bactrim MWF and Acyclovir BID.  INTERVAL HISTORY: Kelli Hurley 60 y.o. female returns for followup of IgG kappa multiple myeloma, Stage II, presenting with severe anemia.  Bone marrow aspiration and biopsy at time of diagnosis demonstrated 18% monoclonal plasma cells, kappa light chain restricted.  Cytogenetics demonstrated a 13q- and 17p- (high risk disease).  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 Debbrah Alar.    Multiple myeloma ([place])   08/07/2014 Imaging Bone Survey- No lytic lesions are noted in the visualized skeleton.   09/03/2014 Bone Marrow Biopsy NORMOCELLULAR BONE MARROW FOR AGE WITH PLASMA CELL NEOPLASM.  The plasma cell component is increased in the marrow representing an estimated 18% of all cells. Cytogenetics with 13q-, 17p- (high risk disease)   09/03/2014 Pathology Results Cytogenetics with 13q-, 17p- (high risk disease)   09/23/2014 Initial Diagnosis Multiple myeloma   09/30/2014 PET scan No abnormal hypermetabolism in the neck, chest, abdomen or pelvis.   10/05/2014 - 03/22/2015 Chemotherapy RVD   10/28/2014 Treatment Plan Change Issues related to getting Revlimid in a timely fashion, therefore, she received her Revlimid on 9/21 resulting in a 12 day cycle this time instead of a 14 day cycle   11/02/2014 Imaging CTA chest- No evidence for a large or central pulmonary embolism as described.  8 mm density along the right minor fissure could represent focal pleural thickening but indeterminate. If the patient is at high risk for bronchogenic carcinoma, follow-up c   11/23/2014 Miscellaneous Zometa 4 mg IV monthly     04/07/2015 Bone Marrow Biopsy Normocellular marrow with 2-4% clonal plasma cells by immunohistochemistry. FISH and cytogenetics were normal [facility])     04/2015 Miscellaneous PRETRANSPLANT EVALUATION:  Pulmonary function tests: FEV1 100.3% / DLCO 97.9%  Echocardiogram: Normal LV function with EF 60-65%    04/27/2015 Procedure Stem cell mobilization with filgrastim and Mozobil [facility])   05/06/2015 Miscellaneous BMT conditioning regimen with high-dose Melphalan given [facility] [name]); Day -1   05/07/2015 Bone Marrow Transplant Outpatient autologous stem cell transplant [facility], [name]); Day 0   05/18/2015 Miscellaneous WBC engraftment;  did not require platelet transfusion during her transplant process. Lowest platelet count 28,000    05/20/2015 Procedure Tunneled catheter removed [facility])   She is doing well. She denies any complaints today. She admits to compliance with her Bactrim and acyclovir medications. She has a follow-up appointment with Dr. Norma Fredrickson at [facility] on 08/23/2015. We will wait to hear from him regarding future surveillance plan.  Patient denies any signs or symptoms of infection. She notes that she feels wonderful and back to baseline. She denies any peripheral neuropathy.  She notes that she is no longer utilizing her inhalers for oxygen at home as she does not need it. She notes that she no longer follows with Dr. Luan Pulling as she has been released from a pulmonary standpoint.  Review of Systems  Constitutional: Negative.  Negative for fever, chills, weight loss and malaise/fatigue.  HENT: Negative.   Eyes: Negative.   Respiratory: Negative.  Negative for cough, sputum production, shortness of breath and wheezing.   Cardiovascular: Negative.   Gastrointestinal: Negative.  Negative for nausea, vomiting, abdominal pain,  diarrhea and constipation.  Genitourinary: Negative.  Negative for dysuria and frequency.  Musculoskeletal: Negative.   Skin: Negative.    Neurological: Negative.  Negative for weakness.  Endo/Heme/Allergies: Negative.   Psychiatric/Behavioral: Negative.     Past Medical History  Diagnosis Date  . Hypertension   . Anemia   . Normocytic hypochromic anemia 08/03/2014  . Leukopenia 08/03/2014  . Claustrophobia 10/05/2014    Past Surgical History  Procedure Laterality Date  . Abdominal hysterectomy    . Other surgical history      heart surgery as infant to "repair hole in heart"    Family History  Problem Relation Age of Onset  . Cancer Mother   . Hypertension Mother   . Cancer Father   . Hypertension Father   . Cancer Maternal Grandmother     Social History   Social History  . Marital Status: Legally Separated    Spouse Name: N/A  . Number of Children: N/A  . Years of Education: N/A   Social History Main Topics  . Smoking status: Never Smoker   . Smokeless tobacco: None  . Alcohol Use: No  . Drug Use: No  . Sexual Activity: Not Asked   Other Topics Concern  . None   Social History Narrative     PHYSICAL EXAMINATION  ECOG PERFORMANCE STATUS: 0 - Asymptomatic  Filed Vitals:   07/30/15 0911  BP: 119/70  Pulse: 90  Temp: 98.7 F (37.1 C)  Resp: 18    GENERAL:alert, no distress, well developed, comfortable, cooperative, obese, smiling and unaccompanied SKIN: skin color, texture, turgor are normal, no rashes or significant lesions HEAD: Normocephalic, No masses, lesions, tenderness or abnormalities EYES: normal, EOMI, Conjunctiva are pink and non-injected EARS: External ears normal OROPHARYNX:lips, buccal mucosa, and tongue normal and mucous membranes are moist  NECK: supple, no adenopathy, thyroid normal size, non-tender, without nodularity, trachea midline LYMPH:  no palpable lymphadenopathy BREAST:not examined LUNGS: clear to auscultation and percussion HEART: regular rate & rhythm, no murmurs, no gallops, S1 normal and S2 normal ABDOMEN:abdomen soft, non-tender, obese, normal bowel  sounds and no masses or organomegaly BACK: Back symmetric, no curvature., No CVA tenderness EXTREMITIES:less then 2 second capillary refill, no joint deformities, effusion, or inflammation, no skin discoloration, no cyanosis  NEURO: alert & oriented x 3 with fluent speech, no focal motor/sensory deficits, gait normal   LABORATORY DATA: CBC    Component Value Date/Time   WBC 5.5 07/16/2015 1054   RBC 3.97 07/16/2015 1054   RBC 3.63* 08/03/2014 1546   HGB 11.4* 07/16/2015 1054   HCT 33.3* 07/16/2015 1054   PLT 229 07/16/2015 1054   MCV 83.9 07/16/2015 1054   MCH 28.7 07/16/2015 1054   MCHC 34.2 07/16/2015 1054   RDW 16.1* 07/16/2015 1054   LYMPHSABS 1.3 07/16/2015 1054   MONOABS 0.6 07/16/2015 1054   EOSABS 0.2 07/16/2015 1054   BASOSABS 0.0 07/16/2015 1054      Chemistry      Component Value Date/Time   NA 139 07/23/2015 1041   K 3.6 07/23/2015 1041   CL 106 07/23/2015 1041   CO2 26 07/23/2015 1041   BUN 19 07/23/2015 1041   CREATININE 1.10* 07/23/2015 1041      Component Value Date/Time   CALCIUM 9.2 07/23/2015 1041   ALKPHOS 49 07/23/2015 1041   AST 15 07/23/2015 1041   ALT 11* 07/23/2015 1041   BILITOT 0.7 07/23/2015 1041        PENDING LABS:   RADIOGRAPHIC  STUDIES:  No results found.   PATHOLOGY:    ASSESSMENT AND PLAN:  Multiple myeloma  IgG kappa multiple myeloma, Stage II, presenting with severe anemia.  Bone marrow aspiration and biopsy at time of diagnosis demonstrated 18% monoclonal plasma cells, kappa light chain restricted.  Cytogenetics demonstrated a 13q- and 17p- (high risk disease).  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 Debbrah Alar.  Oncology history is updated.  Labs today and weekly: CBC diff, CMET, Mg.  I personally reviewed and went over laboratory results with the patient.  The results are noted within this dictation.  If her K+ is  WNL today, we could certainly stop this medication and monitor K+ moving forward.  Dr. Rossie Muskrat note in [place] is noted below: Today 06/07/15 is day +31 after your autologous hematopoietic stem cell transplant (auto-HSCT). We are happy to see that you are feeling well. Please continue to avoid sick contacts, salads at restaurants, open buffets, etc. as you have been doing -- at least until we see you back in clinic.  The next steps are to:  1. Start taking sulfamethoxazole-trimethoprim (Bactrim) on Monday, Wednesday, and Friday to reduce your risk of atypical/pneumocystis lung infections. 2. Please continue taking acyclovir to reduce your risk of viral infections after transplant. 3. Please follow up with [name] once per month for follow-up. 4. You can reduce potassium to once per day and you can stop taking it in two weeks.  And we will see you back in clinic in July.   If you have any questions/concerns please call our clinic.  Reola Calkins, MD Hematology/Oncology Attending  Continue with weekly labs until further directed by [facility].    She has an appointment on 08/23/2015 to see Dr. Norma Fredrickson with a BMBX.  Return in 4 weeks for follow-up.  This appointment, depending on how the patient is doing and Dr. Rossie Muskrat recommendations, can be moved out further if indicated.    ORDERS PLACED FOR THIS ENCOUNTER: Orders Placed This Encounter  Procedures  . CBC with Differential  . Comprehensive metabolic panel  . Magnesium  . CBC with Differential  . Comprehensive metabolic panel  . Magnesium    MEDICATIONS PRESCRIBED THIS ENCOUNTER: No orders of the defined types were placed in this encounter.    THERAPY PLAN:  Continue PCP prophylaxis with Bactrim on MWF and continue Acyclovir BID.  All questions were answered. The patient knows to call the clinic with any problems, questions or concerns. We can certainly see the patient much sooner if  necessary.  Patient and plan discussed with Dr. Ancil Linsey and she is in agreement with the aforementioned.   This note is electronically signed by: Doy Mince 07/30/2015 10:07 AM

## 2015-08-02 DIAGNOSIS — M25562 Pain in left knee: Secondary | ICD-10-CM | POA: Diagnosis not present

## 2015-08-02 DIAGNOSIS — M1712 Unilateral primary osteoarthritis, left knee: Secondary | ICD-10-CM | POA: Diagnosis not present

## 2015-08-03 ENCOUNTER — Encounter: Payer: Self-pay | Admitting: *Deleted

## 2015-08-03 NOTE — Progress Notes (Signed)
[place] Clinical Social Work  Clinical Social Work was referred by patient for assessment of psychosocial needs due to request for gas card. Clinical Social Worker spoke with pt via phone at her home. Pt denies issues with transportation and thinks she "can get to her appointments", but was just asking if this resource was available. CSW stated to pt that CSW would inquire if this resource was available. Pt plans to inquire at her next visit at [facility]. CSW to make referral to financial counselor for further assistance.    Clinical Social Work interventions: Resource education   Loren Racer, [place] Tuesdays   [phone]

## 2015-08-04 DIAGNOSIS — M25561 Pain in right knee: Secondary | ICD-10-CM | POA: Diagnosis not present

## 2015-08-04 DIAGNOSIS — M170 Bilateral primary osteoarthritis of knee: Secondary | ICD-10-CM | POA: Diagnosis not present

## 2015-08-04 DIAGNOSIS — R2689 Other abnormalities of gait and mobility: Secondary | ICD-10-CM | POA: Diagnosis not present

## 2015-08-04 DIAGNOSIS — M25562 Pain in left knee: Secondary | ICD-10-CM | POA: Diagnosis not present

## 2015-08-04 DIAGNOSIS — M1711 Unilateral primary osteoarthritis, right knee: Secondary | ICD-10-CM | POA: Diagnosis not present

## 2015-08-06 ENCOUNTER — Encounter (HOSPITAL_COMMUNITY): Payer: Medicare Other

## 2015-08-06 DIAGNOSIS — C9001 Multiple myeloma in remission: Secondary | ICD-10-CM

## 2015-08-06 DIAGNOSIS — D509 Iron deficiency anemia, unspecified: Secondary | ICD-10-CM | POA: Diagnosis not present

## 2015-08-06 DIAGNOSIS — D72819 Decreased white blood cell count, unspecified: Secondary | ICD-10-CM | POA: Diagnosis not present

## 2015-08-06 LAB — CBC WITH DIFFERENTIAL/PLATELET
BASOS ABS: 0 10*3/uL (ref 0.0–0.1)
Basophils Relative: 0 %
Eosinophils Absolute: 0.1 10*3/uL (ref 0.0–0.7)
Eosinophils Relative: 2 %
HEMATOCRIT: 34.3 % — AB (ref 36.0–46.0)
HEMOGLOBIN: 11.7 g/dL — AB (ref 12.0–15.0)
LYMPHS ABS: 1.2 10*3/uL (ref 0.7–4.0)
LYMPHS PCT: 25 %
MCH: 29.1 pg (ref 26.0–34.0)
MCHC: 34.1 g/dL (ref 30.0–36.0)
MCV: 85.3 fL (ref 78.0–100.0)
Monocytes Absolute: 0.5 10*3/uL (ref 0.1–1.0)
Monocytes Relative: 9 %
NEUTROS ABS: 3.2 10*3/uL (ref 1.7–7.7)
NEUTROS PCT: 62 %
PLATELETS: UNDETERMINED 10*3/uL (ref 150–400)
RBC: 4.02 MIL/uL (ref 3.87–5.11)
RDW: 15.3 % (ref 11.5–15.5)
WBC: 5 10*3/uL (ref 4.0–10.5)

## 2015-08-06 LAB — COMPREHENSIVE METABOLIC PANEL
ALBUMIN: 4.3 g/dL (ref 3.5–5.0)
ALK PHOS: 48 U/L (ref 38–126)
ALT: 15 U/L (ref 14–54)
ANION GAP: 5 (ref 5–15)
AST: 19 U/L (ref 15–41)
BUN: 17 mg/dL (ref 6–20)
CALCIUM: 9.2 mg/dL (ref 8.9–10.3)
CO2: 26 mmol/L (ref 22–32)
Chloride: 105 mmol/L (ref 101–111)
Creatinine, Ser: 1.03 mg/dL — ABNORMAL HIGH (ref 0.44–1.00)
GFR calc Af Amer: >60 mL/min (ref >60)
GFR calc non Af Amer: 59 mL/min — ABNORMAL LOW (ref >60)
GLUCOSE: 91 mg/dL (ref 65–99)
Potassium: 4.2 mmol/L (ref 3.5–5.1)
SODIUM: 136 mmol/L (ref 135–145)
Total Bilirubin: 0.8 mg/dL (ref 0.3–1.2)
Total Protein: 7.4 g/dL (ref 6.5–8.1)

## 2015-08-06 LAB — MAGNESIUM: Magnesium: 2 mg/dL (ref 1.7–2.4)

## 2015-08-13 ENCOUNTER — Encounter (HOSPITAL_COMMUNITY): Payer: Medicare Other | Attending: Hematology & Oncology

## 2015-08-13 DIAGNOSIS — M25561 Pain in right knee: Secondary | ICD-10-CM | POA: Diagnosis not present

## 2015-08-13 DIAGNOSIS — C9000 Multiple myeloma not having achieved remission: Secondary | ICD-10-CM | POA: Insufficient documentation

## 2015-08-13 DIAGNOSIS — M170 Bilateral primary osteoarthritis of knee: Secondary | ICD-10-CM | POA: Diagnosis not present

## 2015-08-13 DIAGNOSIS — Z809 Family history of malignant neoplasm, unspecified: Secondary | ICD-10-CM | POA: Insufficient documentation

## 2015-08-13 DIAGNOSIS — M25562 Pain in left knee: Secondary | ICD-10-CM | POA: Diagnosis not present

## 2015-08-13 DIAGNOSIS — C9001 Multiple myeloma in remission: Secondary | ICD-10-CM

## 2015-08-13 DIAGNOSIS — Z90710 Acquired absence of both cervix and uterus: Secondary | ICD-10-CM | POA: Diagnosis not present

## 2015-08-13 LAB — COMPREHENSIVE METABOLIC PANEL
ALT: 36 U/L (ref 14–54)
AST: 34 U/L (ref 15–41)
Albumin: 4.5 g/dL (ref 3.5–5.0)
Alkaline Phosphatase: 52 U/L (ref 38–126)
Anion gap: 6 (ref 5–15)
BILIRUBIN TOTAL: 0.5 mg/dL (ref 0.3–1.2)
BUN: 20 mg/dL (ref 6–20)
CHLORIDE: 108 mmol/L (ref 101–111)
CO2: 26 mmol/L (ref 22–32)
CREATININE: 1.1 mg/dL — AB (ref 0.44–1.00)
Calcium: 9.4 mg/dL (ref 8.9–10.3)
GFR, EST NON AFRICAN AMERICAN: 54 mL/min — AB (ref >60)
Glucose, Bld: 99 mg/dL (ref 65–99)
POTASSIUM: 4.3 mmol/L (ref 3.5–5.1)
Sodium: 140 mmol/L (ref 135–145)
TOTAL PROTEIN: 7.7 g/dL (ref 6.5–8.1)

## 2015-08-13 LAB — CBC WITH DIFFERENTIAL/PLATELET
Basophils Absolute: 0 10*3/uL (ref 0.0–0.1)
Basophils Relative: 0 %
EOS PCT: 2 %
Eosinophils Absolute: 0.1 10*3/uL (ref 0.0–0.7)
HCT: 34.3 % — ABNORMAL LOW (ref 36.0–46.0)
Hemoglobin: 11.5 g/dL — ABNORMAL LOW (ref 12.0–15.0)
LYMPHS ABS: 1.4 10*3/uL (ref 0.7–4.0)
Lymphocytes Relative: 27 %
MCH: 28.8 pg (ref 26.0–34.0)
MCHC: 33.5 g/dL (ref 30.0–36.0)
MCV: 86 fL (ref 78.0–100.0)
MONO ABS: 0.4 10*3/uL (ref 0.1–1.0)
MONOS PCT: 8 %
Neutro Abs: 3.2 10*3/uL (ref 1.7–7.7)
Neutrophils Relative %: 63 %
PLATELETS: 231 10*3/uL (ref 150–400)
RBC: 3.99 MIL/uL (ref 3.87–5.11)
RDW: 14.9 % (ref 11.5–15.5)
WBC: 5.1 10*3/uL (ref 4.0–10.5)

## 2015-08-13 LAB — MAGNESIUM: MAGNESIUM: 2 mg/dL (ref 1.7–2.4)

## 2015-08-18 ENCOUNTER — Telehealth (HOSPITAL_COMMUNITY): Payer: Self-pay | Admitting: Emergency Medicine

## 2015-08-18 ENCOUNTER — Other Ambulatory Visit (HOSPITAL_COMMUNITY): Payer: Self-pay | Admitting: Emergency Medicine

## 2015-08-18 DIAGNOSIS — C9000 Multiple myeloma not having achieved remission: Secondary | ICD-10-CM | POA: Diagnosis not present

## 2015-08-18 DIAGNOSIS — Z9484 Stem cells transplant status: Secondary | ICD-10-CM | POA: Diagnosis not present

## 2015-08-18 DIAGNOSIS — C9001 Multiple myeloma in remission: Secondary | ICD-10-CM | POA: Diagnosis not present

## 2015-08-18 NOTE — Telephone Encounter (Signed)
Spoke with Dr Fernanda Drum had labs today and we can cancel lab appt on Friday per Dr Whitney Muse.

## 2015-08-18 NOTE — Telephone Encounter (Signed)
-----   Message from Epifanio Lesches sent at 08/18/2015  2:40 PM EDT ----- Pt wants to know if she still needs labs on Friday since she had labs today in [place]? She does not know what labs she had and could not give me the name of a dr

## 2015-08-20 ENCOUNTER — Other Ambulatory Visit (HOSPITAL_COMMUNITY): Payer: Medicare Other

## 2015-08-23 DIAGNOSIS — C9000 Multiple myeloma not having achieved remission: Secondary | ICD-10-CM | POA: Diagnosis not present

## 2015-08-23 DIAGNOSIS — Z9484 Stem cells transplant status: Secondary | ICD-10-CM | POA: Diagnosis not present

## 2015-08-25 ENCOUNTER — Other Ambulatory Visit (HOSPITAL_COMMUNITY): Payer: Self-pay | Admitting: Oncology

## 2015-08-27 ENCOUNTER — Encounter (HOSPITAL_COMMUNITY): Payer: Self-pay | Admitting: Oncology

## 2015-08-27 ENCOUNTER — Encounter (HOSPITAL_BASED_OUTPATIENT_CLINIC_OR_DEPARTMENT_OTHER): Payer: Medicare Other | Admitting: Oncology

## 2015-08-27 ENCOUNTER — Other Ambulatory Visit (HOSPITAL_COMMUNITY): Payer: Medicare Other

## 2015-08-27 ENCOUNTER — Other Ambulatory Visit (HOSPITAL_COMMUNITY): Payer: Self-pay | Admitting: Emergency Medicine

## 2015-08-27 VITALS — Wt 307.0 lb

## 2015-08-27 DIAGNOSIS — C9001 Multiple myeloma in remission: Secondary | ICD-10-CM | POA: Diagnosis not present

## 2015-08-27 DIAGNOSIS — D649 Anemia, unspecified: Secondary | ICD-10-CM

## 2015-08-27 MED ORDER — IXAZOMIB CITRATE 2.3 MG PO CAPS: 2.3000 mg | ORAL_CAPSULE | ORAL | Status: DC

## 2015-08-27 NOTE — Assessment & Plan Note (Addendum)
IgG kappa multiple myeloma, Stage II, presenting with severe anemia.  Bone marrow aspiration and biopsy at time of diagnosis demonstrated 18% monoclonal plasma cells, kappa light chain restricted.  Cytogenetics demonstrated a 13q- and 17p- (high risk disease).  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 Debbrah Alar.  Oncology history is updated.  Labs today: CBC diff, CMET, Mg.  I personally reviewed and went over laboratory results with the patient.  The results are noted within this dictation.    Dr. Rossie Muskrat note from 08/23/2015 is reviewed: PLAN:  IgG kappa myeloma with high risk features (-17p and -13q), Stage II by R-ISS: PET/CT and bone marrow biopsy do not show any evidence of myeloma, however, M-Spike remains detectable at 0.35 and 0.28. Immunofixation monoclonal Kappa X 2. We recommend beginning maintenance therapy with ixazomib 2.3 mg days 1, 8, 15 every 28 days and rechecking myeloma markers in one month.   Local care recommendations: a CBC and CMP is recommended every week for the first month on maintenance therapy to monitor counts and tolerability to therapy. If counts are good, can change lab draws to monthly for the remainder of the first year. Myeloma studies are recommended monthly for the first year and can be spaced out to every 3-6 months after that if stable.  Prophylaxis: The patient is to continue antibiotics for PCP prophylaxis with Bactrim DS for a total of 6 months and antiviral therapy with Acyclovir 800 mg twice daily for the suppression of HSV and VZV for a total of 1 year. No immunizations are recommended at this time. She may get immunizations after 6 months. Follow-up: she will return to see [place] at the [facility] in 3 months for an assessment and re-immunization.  Reola Calkins, MD Hematology/Oncology Attending  According to discharge instructions from  [facility] if ixazomib is not approved by her insurance, Velcade 1.3 mg/m every 2 weeks on days 1 and 15 every 28 days as recommended.  In review, the patient will be started, under the guidance of Dr. Reola Calkins, MD, on ixazomib 2.3 mg days 1, 8, 15, every 28 days.  Dr. Reola Calkins, MD recommends weekly labs (CBC, CMET) for the first month followed by labs every month for the remainder of year 1.  Multiple myeloma studies are recommended monthly for the 1st year and then every 3-6 months after that if stable.  She is to continue PCP prophylaxis with Bactrim DS x 6 months and antiviral therapy with Acyclovir 800 mg BID for HSV and VZV suppression x 1 year.  She may get immunizations after 6 months.  Weekly labs: CBC diff, CMET x 4 once started on ixazomib.  She will need chemotherapy teaching for this medication.    Return in ~ 2 weeks for follow-up.

## 2015-08-27 NOTE — Patient Instructions (Signed)
[place] at [facility] Discharge Instructions  RECOMMENDATIONS MADE BY THE CONSULTANT AND ANY TEST RESULTS WILL BE SENT TO YOUR REFERRING PHYSICIAN.  You were seen by Kelli Hurley today. Take ninlavo 2.3 mg  On days 1,8,15 every 28 days Weekly labs beginning in 1 week times 4 weeks Return in 3 weeks for follow up   Thank you for choosing [place] at [facility] to provide your oncology and hematology care.  To afford each patient quality time with our provider, please arrive at least 15 minutes before your scheduled appointment time.   Beginning January 23rd 2017 lab work for the [name] will be done in the  Main lab at [facility] on 1st floor. If you have a lab appointment with the [place] please come in thru the  Main Entrance and check in at the main information desk  You need to re-schedule your appointment should you arrive 10 or more minutes late.  We strive to give you quality time with our providers, and arriving late affects you and other patients whose appointments are after yours.  Also, if you no show three or more times for appointments you may be dismissed from the clinic at the providers discretion.     Again, thank you for choosing [facility].  Our hope is that these requests will decrease the amount of time that you wait before being seen by our physicians.       _____________________________________________________________  Should you have questions after your visit to [facility], please contact our office at [phone] between the hours of 8:30 a.m. and 4:30 p.m.  Voicemails left after 4:30 p.m. will not be returned until the following business day.  For prescription refill requests, have your pharmacy contact our office.         Resources For Cancer Patients and their Caregivers ? American Cancer Society: Can assist with transportation, wigs, general needs, runs Look Good Feel  Better.        [phone] ? Cancer Care: Provides financial assistance, online support groups, medication/co-pay assistance.  1-800-813-HOPE 619-656-0075) ? [facility] Assists [place] cancer patients and their families through emotional , educational and financial support.  916-643-4989 ? [name] Where to apply for food stamps, Medicaid and utility assistance. 8705224934 ? [name]: Transportation to medical appointments. 651-787-9153 ? Social Security Administration: May apply for disability if have a Stage IV cancer. 515 447 3953 667-456-1802 ? [name], Disability and Transit Services: Assists with nutrition, care and transit needs. [place] Support Programs: @10RELATIVEDAYS @ > Cancer Support Group  2nd Tuesday of the month 1pm-2pm, Journey Room  > Creative Journey  3rd Tuesday of the month 1130am-1pm, Journey Room  > Look Good Feel Better  1st Wednesday of the month 10am-12 noon, Journey Room (Call [place] to register [phone])

## 2015-08-28 ENCOUNTER — Telehealth (HOSPITAL_COMMUNITY): Payer: Self-pay | Admitting: Hematology & Oncology

## 2015-08-28 NOTE — Telephone Encounter (Signed)
Faxed ninlaro script to [facility]

## 2015-08-29 NOTE — Progress Notes (Addendum)
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 female returns for followup of  IgG kappa multiple myeloma, Stage II, presenting with severe anemia.  Bone marrow aspiration and biopsy at time of diagnosis demonstrated 18% monoclonal plasma cells, kappa light chain restricted.  Cytogenetics demonstrated a 13q- and 17p- (high risk disease).  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 Debbrah Alar.    Multiple myeloma ([facility])   08/07/2014 Imaging    Bone Survey- No lytic lesions are noted in the visualized skeleton.     09/03/2014 Bone Marrow Biopsy    NORMOCELLULAR BONE MARROW FOR AGE WITH PLASMA CELL NEOPLASM.  The plasma cell component is increased in the marrow representing an estimated 18% of all cells. Cytogenetics with 13q-, 17p- (high risk disease)     09/03/2014 Pathology Results    Cytogenetics with 13q-, 17p- (high risk disease)     09/23/2014 Initial Diagnosis    Multiple myeloma     09/30/2014 PET scan    No abnormal hypermetabolism in the neck, chest, abdomen or pelvis.     10/05/2014 - 03/22/2015 Chemotherapy    RVD     10/28/2014 Treatment Plan Change    Issues related to getting Revlimid in a timely fashion, therefore, she received her Revlimid on 9/21 resulting in a 12 day cycle this time instead of a 14 day cycle     11/02/2014 Imaging    CTA chest- No evidence  for a large or central pulmonary embolism as described.  8 mm density along the right minor fissure could represent focal pleural thickening but indeterminate. If the patient is at high risk for bronchogenic carcinoma, follow-up c     11/23/2014 Miscellaneous    Zometa 4 mg IV monthly     04/07/2015 Bone Marrow Biopsy    Normocellular marrow with 2-4% clonal plasma cells by immunohistochemistry. FISH and cytogenetics were normal [facility])       04/2015 Miscellaneous    PRETRANSPLANT EVALUATION:  Pulmonary function tests: FEV1 100.3% / DLCO 97.9%  Echocardiogram: Normal LV function with EF 60-65%      04/27/2015 Procedure    Stem cell mobilization with filgrastim and Mozobil [facility])     05/06/2015 Miscellaneous    BMT conditioning regimen with high-dose Melphalan given [facility] [name]); Day -1     05/07/2015 Bone Marrow Transplant    Outpatient autologous stem cell transplant [facility] [name]); Day 0     05/18/2015 Miscellaneous    WBC engraftment;  did not require platelet transfusion during her transplant process. Lowest platelet count 28,000      05/20/2015 Procedure    Tunneled catheter removed [facility])     She denies any complaints today.  Chart is reviewed. I reviewed her [facility] dictation from the other day from her transplant physician, Dr. Norma Fredrickson.  Parts of his dictation are noted below in the plan.  I reviewed Dr. Norma Fredrickson as planned with the patient  and we'll move forward as recommended.  Review of Systems  Constitutional: Negative.   HENT: Negative.   Eyes: Negative.   Respiratory: Negative.   Cardiovascular: Negative.   Gastrointestinal: Negative.   Genitourinary: Negative.   Musculoskeletal: Negative.   Skin: Negative.   Neurological: Negative.   Endo/Heme/Allergies: Negative.   Psychiatric/Behavioral: Negative.     Past Medical History:  Diagnosis Date  . Anemia   . Claustrophobia 10/05/2014  . Hypertension   . Leukopenia 08/03/2014  .  Normocytic hypochromic anemia 08/03/2014    Past Surgical History:  Procedure Laterality Date  . ABDOMINAL HYSTERECTOMY    . OTHER SURGICAL HISTORY     heart surgery as infant to "repair hole in heart"    Family History  Problem Relation Age of Onset  . Cancer Mother   . Hypertension Mother   . Cancer Father   . Hypertension Father   . Cancer Maternal Grandmother     Social History   Social History  . Marital status: Legally Separated    Spouse name: N/A  . Number of children: N/A  . Years of education: N/A   Social History Main Topics  . Smoking status: Never Smoker  . Smokeless tobacco: None  . Alcohol use No  . Drug use: No  . Sexual activity: Not Asked   Other Topics Concern  . None   Social History Narrative  . None     PHYSICAL EXAMINATION  ECOG PERFORMANCE STATUS: 1 - Symptomatic but completely ambulatory  There were no vitals filed for this visit.  GENERAL:alert, no distress, well nourished, well developed, comfortable, cooperative, obese and smiling, Unaccompanied SKIN: skin color, texture, turgor are normal, no rashes or significant lesions HEAD: Normocephalic, No masses, lesions, tenderness or abnormalities EYES: normal, EOMI, Conjunctiva are pink and non-injected EARS: External ears normal OROPHARYNX:lips, buccal mucosa, and tongue normal and mucous membranes are moist  NECK: supple, trachea midline LYMPH:  no palpable lymphadenopathy BREAST:not examined LUNGS: clear to auscultation and percussion HEART: regular rate & rhythm, no murmurs, no gallops, S1 normal and S2 normal ABDOMEN:abdomen soft, non-tender, obese and normal bowel sounds BACK: Back symmetric, no curvature. EXTREMITIES:less then 2 second capillary refill, no joint deformities, effusion, or inflammation, no edema, no skin discoloration, no clubbing, no cyanosis  NEURO: alert & oriented x 3 with fluent speech, no focal motor/sensory deficits, gait normal   LABORATORY DATA: CBC     Component Value Date/Time   WBC 5.1 08/13/2015 1211   RBC 3.99 08/13/2015 1211   HGB 11.5 (L) 08/13/2015 1211   HCT 34.3 (L) 08/13/2015 1211   PLT 231 08/13/2015 1211   MCV 86.0 08/13/2015 1211   MCH 28.8 08/13/2015 1211   MCHC 33.5 08/13/2015 1211   RDW 14.9 08/13/2015 1211   LYMPHSABS 1.4 08/13/2015 1211   MONOABS 0.4 08/13/2015 1211   EOSABS 0.1 08/13/2015 1211   BASOSABS 0.0 08/13/2015 1211      Chemistry      Component Value Date/Time   NA 140 08/13/2015 1211   K 4.3 08/13/2015 1211   CL 108 08/13/2015 1211   CO2 26 08/13/2015 1211   BUN 20 08/13/2015 1211   CREATININE 1.10 (H) 08/13/2015 1211      Component Value Date/Time   CALCIUM 9.4 08/13/2015 1211   ALKPHOS 52 08/13/2015 1211   AST 34 08/13/2015 1211   ALT 36 08/13/2015 1211   BILITOT 0.5 08/13/2015 1211        PENDING LABS:   RADIOGRAPHIC  STUDIES:  No results found.   PATHOLOGY:    ASSESSMENT AND PLAN:  Multiple myeloma  IgG kappa multiple myeloma, Stage II, presenting with severe anemia.  Bone marrow aspiration and biopsy at time of diagnosis demonstrated 18% monoclonal plasma cells, kappa light chain restricted.  Cytogenetics demonstrated a 13q- and 17p- (high risk disease).  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 Debbrah Alar.  Oncology history is updated.  Labs today: CBC diff, CMET, Mg.  I personally reviewed and went over laboratory results with the patient.  The results are noted within this dictation.    Dr. Rossie Muskrat note from 08/23/2015 is reviewed: PLAN:  IgG kappa myeloma with high risk features (-17p and -13q), Stage II by R-ISS: PET/CT and bone marrow biopsy do not show any evidence of myeloma, however, M-Spike remains detectable at 0.35 and 0.28. Immunofixation monoclonal Kappa X 2. We recommend beginning maintenance therapy with ixazomib 2.3 mg days 1, 8, 15 every 28 days and  rechecking myeloma markers in one month.   Local care recommendations: a CBC and CMP is recommended every week for the first month on maintenance therapy to monitor counts and tolerability to therapy. If counts are good, can change lab draws to monthly for the remainder of the first year. Myeloma studies are recommended monthly for the first year and can be spaced out to every 3-6 months after that if stable.  Prophylaxis: The patient is to continue antibiotics for PCP prophylaxis with Bactrim DS for a total of 6 months and antiviral therapy with Acyclovir 800 mg twice daily for the suppression of HSV and VZV for a total of 1 year. No immunizations are recommended at this time. She may get immunizations after 6 months. Follow-up: she will return to see [place] at the [facility] in 3 months for an assessment and re-immunization.  Reola Calkins, MD Hematology/Oncology Attending  According to discharge instructions from [facility] if ixazomib is not approved by her insurance, Velcade 1.3 mg/m every 2 weeks on days 1 and 15 every 28 days as recommended.  In review, the patient will be started, under the guidance of Dr. Reola Calkins, MD, on ixazomib 2.3 mg days 1, 8, 15, every 28 days.  Dr. Reola Calkins, MD recommends weekly labs (CBC, CMET) for the first month followed by labs every month for the remainder of year 1.  Multiple myeloma studies are recommended monthly for the 1st year and then every 3-6 months after that if stable.  She is to continue PCP prophylaxis with Bactrim DS x 6 months and antiviral therapy with Acyclovir 800 mg BID for HSV and VZV suppression x 1 year.  She may get immunizations after 6 months.  Weekly labs: CBC diff, CMET x 4 once started on ixazomib.  She will need chemotherapy teaching for this medication.    Return in ~ 2 weeks for follow-up.   ORDERS PLACED FOR THIS ENCOUNTER: Orders Placed This Encounter  Procedures  . CBC with Differential  .  Comprehensive metabolic panel  . Kappa/lambda light chains  . Beta 2 microglobuline, serum  . IgG, IgA, IgM  . Immunofixation electrophoresis  . Protein electrophoresis, serum  . Lactate dehydrogenase  . Sedimentation rate  . C-reactive protein    MEDICATIONS PRESCRIBED THIS ENCOUNTER: Meds ordered this encounter  Medications  . ixazomib citrate (NINLARO) 2.3 MG capsule    Sig: Take 1 capsule (  2.3 mg total) by mouth once a week. Take on an empty stomach 1hr before or 2hrs after food. Do not crush, chew or open.    Dispense:  3 capsule    Refill:  1    Order Specific Question:   Supervising Provider    Answer:   Patrici Ranks [id number]    THERAPY PLAN:  She will start therapy as outlined above.  All questions were answered. The patient knows to call the clinic with any problems, questions or concerns. We can certainly see the patient much sooner if necessary.  Patient and plan discussed with Dr. Ancil Linsey and she is in agreement with the aforementioned.   This note is electronically signed by: Doy Mince 08/29/2015 8:03 PM

## 2015-08-30 ENCOUNTER — Telehealth (HOSPITAL_COMMUNITY): Payer: Self-pay | Admitting: Oncology

## 2015-08-30 MED ORDER — PROCHLORPERAZINE MALEATE 10 MG PO TABS: 10.0000 mg | ORAL_TABLET | Freq: Four times a day (QID) | ORAL | 2 refills | Status: DC | PRN

## 2015-08-30 MED ORDER — ONDANSETRON HCL 8 MG PO TABS: 8.0000 mg | ORAL_TABLET | Freq: Three times a day (TID) | ORAL | 2 refills | Status: DC | PRN

## 2015-08-30 NOTE — Patient Instructions (Addendum)
[place] will take Ninlaro on Day 1 day 8 day 15 every 28 days. Just 3 days a month.   NINLARO (ixazomib) is a capsule that you take once a week for 3 weeks of a 4-week cycle. NINLARO is taken along with 2 other medications, lenalidomide and dexamethasone, in 4-week cycles. This dosing calendar is a snapshot of the [facility] regimen for a 28-day cycle. If you miss a dose of NINLARO, you can take the missed dose as long as the next scheduled one is more than 3 days away. NINLARO is absorbed into your body very quickly. If you spit out NINLARO or vomit after taking a dose, do not repeat the dose. Even after vomiting or spitting out a dose, your body may have taken in some of the medication. Instead of repeating the dose, take your next dose of NINLARO on the next scheduled day and time. NINLARO may cause serious side effects, including: .Low platelet counts (thrombocytopenia). Low platelet counts are common with NINLARO and can sometimes be serious. You may need platelet transfusions if your counts are too low. Tell your healthcare provider if you have any signs of low platelet counts, including bleeding and easy bruising.  .  Stomach and intestinal (gastrointestinal) problems. Diarrhea, constipation, nausea, and vomiting are common with NINLARO and can sometimes be severe. Call your healthcare provider if you get any of these symptoms and they do not go away during treatment with NINLARO. Your healthcare provider may prescribe medicine to help treat your symptoms.  .  Nerve problems. Nerve problems are common with NINLARO and may also be severe. Tell your healthcare provider if you get any new or worsening symptoms, including: - Tingling - Numbness - Pain - A burning feeling in your feet or hands - Weakness in your arms or legs   . Swelling. Swelling is common with NINLARO and can sometimes be severe. Tell your healthcare provider if you develop  swelling in your arms, hands, legs, ankles, or feet, or if you gain weight from swelling.  . Skin reactions. Tell your healthcare provider if you get a new or worsening rash.  . Liver problems. Tell your healthcare provider if you get these signs of a liver problem: - Yellowing of your skin or the whites of your eyes - Pain in your right upper-stomach area   Other common side effects have occurred. Tell your healthcare provider if you get new or worsening signs or symptoms of the following: .Back pain .Skin rash and pain (shingles) as a result of reactivation of the chicken pox virus (herpes zoster) .Lowered white blood cells called neutrophils (neutropenia) that may increase the risk of infection .Vision conditions including blurred vision, dry eye and pink eye (conjunctivitis)  Other rare side effects (observed in less than 0.1% of patients) that can be serious have occurred. Tell your healthcare provider if you get any of the following signs or symptoms: .Severe skin rashes such as red to purple bumps ([place]) or rash with skin peeling and mouth sores Kelli Hurley) .Muscle weakness, loss of feelings of the toes and feet or loss of leg movement (transverse myelitis) .Changes in vision, changes in mental status, or seizures (posterior reversible encephalopathy Hurley) .Rapid death of cancer cells that may cause dizziness, decreased urination, confusion, vomiting, nausea, swelling, shortness of breath, or heart rhythm disturbances (tumor lysis Hurley) .Rare blood condition resulting from blood clots that may cause fatigue, fever, bruising, nose bleeds,  decreased urination (thrombotic thrombocytopenic purpura)      How do I take Ninlaro?   Marland KitchenTake NINLARO exactly as your healthcare provider tells you to take it. Do not change your dose or stop taking NINLARO without talking to your healthcare provider first .Kennieth Rad is taken in "cycles." Each cycle lasts 4 weeks (28  days) .The usual dose of NINLARO is 1 capsule taken 1 time each week, on the same day of the week for the first 3 weeks of each cycle .Take each dose of NINLARO at about the same time of day . Take REVLIMID (lenalidomide) and dexamethasone exactly as your healthcare provider tells you to  . Your healthcare provider will do blood tests during treatment with NINLARO to check for side effects  . Your healthcare provider may change your dosage or stop NINLARO, REVLIMID, or dexamethasone if you have side effects  . Take NINLARO at least 1 hour before or at least 2 hours after food  . On the days that you take both NINLARO and dexamethasone, do not take NINLARO and dexamethasone at the same time. Take dexamethasone with food  . Swallow NINLARO capsules whole with water. Do not crush, chew, or open the capsule  . Avoid direct contact with the capsule contents. If you accidentally get powder from the [facility] capsule on your skin, wash the area well with soap and water. If you accidentally get powder from the [facility] capsule in your eyes, flush your eyes well with water  . If you miss a dose of NINLARO, or if you are late taking a dose, take the dose as long as the next scheduled dose is more than 3 days (72 hours) away. Do not take a missed dose of NINLARO if it is within 3 days (72 hours) of your next scheduled dose  . If you vomit after taking a dose of NINLARO, do not repeat the dose. Take your next dose of NINLARO on the next scheduled day and time  . Your doctor may prescribe a medicine to take with NINLARO to decrease the risk of the chicken pox virus (herpes zoster) coming back (reactivation)  .If you take more NINLARO than your healthcare provider tells you to take, call your healthcare provider right away or go to the nearest hospital emergency room   How does NINLARO work?     NINLARO targets a part of cells called proteasomes. It works by slowing down or blocking proteasomes from doing their  job of digesting proteins. In myeloma cells, there is a greater need for proteasomes to digest proteins; the buildup of excess proteins causes cell death.     SELF IMAGE NEEDS AND REFERRALS MADE: Information on look good feel better given   EDUCATIONAL MATERIALS GIVEN AND REVIEWED: Chemotherapy and you book given and Ninlaro information given      MEDICATIONS: You have been given prescriptions for the following medications:  Zofran/Ondansetron 8mg  tablet. Take 1 tablet every 8 hours as needed for nausea/vomiting. (#1 nausea med to take, this can constipate)  Compazine/Prochlorperazine 10mg  tablet. Take 1 tablet every 6 hours as needed for nausea/vomiting. (#2 nausea med to take, this can make you sleepy)  Over-the-Counter Meds:  Miralax 1 capful in 8 oz of fluid daily. May increase to two times a day if needed. This is a stool softener. If this doesn't work proceed you can add:  Senokot S  - start with 1 tablet two times a day and increase to 4 tablets two times a day if needed. (total of 8 tablets in a 24 hour period). This is a stimulant laxative.   Call us if this does not help your bowels move.   Imodium 2mg  capsule. Take 2 capsules after the 1st loose stool and then 1 capsule every 2 hours until you go a total of 12 hours without having a loose stool. Call the [place] if loose stools continue. If diarrhea occurs @ bedtime, take 2 capsules @ bedtime. Then take 2 capsules every 4 hours until morning. Call [place].       (Please refer to/review other teaching materials that have been provided to you in this blue folder - What to Know During Chemo, What to know After Chemo, Dr. Donald Pore Advice, Constipation Sheet, Diarrhea Sheet, Nausea Sheet, Self Care Activities While on Chemo)      SYMPTOMS TO REPORT  AS SOON AS POSSIBLE AFTER TREATMENT:  FEVER GREATER THAN 100.5 F  CHILLS WITH OR WITHOUT FEVER  NAUSEA AND VOMITING THAT IS NOT CONTROLLED WITH YOUR NAUSEA MEDICATION  UNUSUAL SHORTNESS OF BREATH  UNUSUAL BRUISING OR BLEEDING  TENDERNESS IN MOUTH AND THROAT WITH OR WITHOUT PRESENCE OF ULCERS  URINARY PROBLEMS  BOWEL PROBLEMS  UNUSUAL RASH    Wear comfortable clothing and clothing appropriate for easy access to any Portacath or PICC line. Let us know if there is anything that we can do to make your therapy better!      I have been informed and understand all of the instructions given to me and have received a copy. I have been instructed to call the clinic [phone]  or my family physician as soon as possible for continued medical care, if indicated. I do not have any more questions at this time but understand that I may call the [place] at [phone] during office hours should I have questions or need assistance in obtaining follow-up care.

## 2015-08-31 ENCOUNTER — Other Ambulatory Visit (HOSPITAL_COMMUNITY): Payer: Self-pay | Admitting: Oncology

## 2015-09-01 ENCOUNTER — Ambulatory Visit (HOSPITAL_COMMUNITY): Payer: Medicare Other

## 2015-09-01 ENCOUNTER — Inpatient Hospital Stay (HOSPITAL_COMMUNITY): Payer: Medicare Other

## 2015-09-01 ENCOUNTER — Encounter (HOSPITAL_COMMUNITY): Payer: Self-pay

## 2015-09-02 ENCOUNTER — Other Ambulatory Visit (HOSPITAL_COMMUNITY): Payer: Self-pay | Admitting: Emergency Medicine

## 2015-09-02 DIAGNOSIS — C9000 Multiple myeloma not having achieved remission: Secondary | ICD-10-CM

## 2015-09-08 ENCOUNTER — Encounter (HOSPITAL_BASED_OUTPATIENT_CLINIC_OR_DEPARTMENT_OTHER): Payer: Medicare Other

## 2015-09-08 ENCOUNTER — Inpatient Hospital Stay (HOSPITAL_COMMUNITY): Payer: Medicare Other

## 2015-09-08 ENCOUNTER — Encounter (HOSPITAL_COMMUNITY): Payer: Medicare Other | Attending: Oncology

## 2015-09-08 DIAGNOSIS — D72819 Decreased white blood cell count, unspecified: Secondary | ICD-10-CM | POA: Diagnosis not present

## 2015-09-08 DIAGNOSIS — Z5112 Encounter for antineoplastic immunotherapy: Secondary | ICD-10-CM

## 2015-09-08 DIAGNOSIS — C9000 Multiple myeloma not having achieved remission: Secondary | ICD-10-CM | POA: Diagnosis not present

## 2015-09-08 DIAGNOSIS — D509 Iron deficiency anemia, unspecified: Secondary | ICD-10-CM | POA: Insufficient documentation

## 2015-09-08 DIAGNOSIS — C9001 Multiple myeloma in remission: Secondary | ICD-10-CM

## 2015-09-08 LAB — CBC WITH DIFFERENTIAL/PLATELET
Basophils Absolute: 0 10*3/uL (ref 0.0–0.1)
Basophils Relative: 0 %
Eosinophils Absolute: 0.1 10*3/uL (ref 0.0–0.7)
Eosinophils Relative: 3 %
HCT: 33.5 % — ABNORMAL LOW (ref 36.0–46.0)
HEMOGLOBIN: 11.2 g/dL — AB (ref 12.0–15.0)
LYMPHS ABS: 1.2 10*3/uL (ref 0.7–4.0)
LYMPHS PCT: 25 %
MCH: 29.2 pg (ref 26.0–34.0)
MCHC: 33.4 g/dL (ref 30.0–36.0)
MCV: 87.5 fL (ref 78.0–100.0)
MONOS PCT: 8 %
Monocytes Absolute: 0.4 10*3/uL (ref 0.1–1.0)
NEUTROS PCT: 64 %
Neutro Abs: 2.9 10*3/uL (ref 1.7–7.7)
Platelets: 214 10*3/uL (ref 150–400)
RBC: 3.83 MIL/uL — AB (ref 3.87–5.11)
RDW: 13.7 % (ref 11.5–15.5)
WBC: 4.5 10*3/uL (ref 4.0–10.5)

## 2015-09-08 LAB — COMPREHENSIVE METABOLIC PANEL
ALK PHOS: 46 U/L (ref 38–126)
ALT: 15 U/L (ref 14–54)
ANION GAP: 4 — AB (ref 5–15)
AST: 20 U/L (ref 15–41)
Albumin: 4.3 g/dL (ref 3.5–5.0)
BILIRUBIN TOTAL: 0.5 mg/dL (ref 0.3–1.2)
BUN: 18 mg/dL (ref 6–20)
CALCIUM: 9.4 mg/dL (ref 8.9–10.3)
CO2: 28 mmol/L (ref 22–32)
CREATININE: 0.85 mg/dL (ref 0.44–1.00)
Chloride: 105 mmol/L (ref 101–111)
Glucose, Bld: 98 mg/dL (ref 65–99)
Potassium: 3.7 mmol/L (ref 3.5–5.1)
Sodium: 137 mmol/L (ref 135–145)
TOTAL PROTEIN: 7.4 g/dL (ref 6.5–8.1)

## 2015-09-08 LAB — MAGNESIUM: MAGNESIUM: 1.9 mg/dL (ref 1.7–2.4)

## 2015-09-08 MED ORDER — PROCHLORPERAZINE MALEATE 10 MG PO TABS
ORAL_TABLET | ORAL | Status: AC
  Filled 2015-09-08: qty 1

## 2015-09-08 MED ORDER — BORTEZOMIB CHEMO SQ INJECTION 3.5 MG (2.5MG/ML)
1.3000 mg/m2 | Freq: Once | INTRAMUSCULAR | Status: AC
  Administered 2015-09-08: 3.25 mg via SUBCUTANEOUS
  Filled 2015-09-08: qty 3.25

## 2015-09-08 MED ORDER — PROCHLORPERAZINE MALEATE 10 MG PO TABS
10.0000 mg | ORAL_TABLET | Freq: Once | ORAL | Status: AC
  Administered 2015-09-08: 10 mg via ORAL

## 2015-09-08 NOTE — Progress Notes (Signed)
Kelli Hurley presents today for injection per MD orders. Velcade administered SQ in right Abdomen. Administration without incident. Patient tolerated well.  

## 2015-09-08 NOTE — Patient Instructions (Signed)
[facility] Discharge Instructions for Patients Receiving Chemotherapy   Beginning January 23rd 2017 lab work for the [facility] will be done in the  Main lab at [facility] on 1st floor. If you have a lab appointment with the [place] please come in thru the  Main Entrance and check in at the main information desk   Today you received the following chemotherapy agents Velcade injection.  If you develop nausea and vomiting, or diarrhea that is not controlled by your medication, call the clinic.  The clinic phone number is [phone]. Office hours are Monday-Friday 8:30am-5:00pm.  BELOW ARE SYMPTOMS THAT SHOULD BE REPORTED IMMEDIATELY:  *FEVER GREATER THAN 101.0 F  *CHILLS WITH OR WITHOUT FEVER  NAUSEA AND VOMITING THAT IS NOT CONTROLLED WITH YOUR NAUSEA MEDICATION  *UNUSUAL SHORTNESS OF BREATH  *UNUSUAL BRUISING OR BLEEDING  TENDERNESS IN MOUTH AND THROAT WITH OR WITHOUT PRESENCE OF ULCERS  *URINARY PROBLEMS  *BOWEL PROBLEMS  UNUSUAL RASH Items with * indicate a potential emergency and should be followed up as soon as possible. If you have an emergency after office hours please contact your primary care physician or go to the nearest emergency department.  Please call the clinic during office hours if you have any questions or concerns.   You may also contact the Patient Navigator at [phone] should you have any questions or need assistance in obtaining follow up care.      Resources For Cancer Patients and their Caregivers ? American Cancer Society: Can assist with transportation, wigs, general needs, runs Look Good Feel Better.        (419)358-0976 ? Cancer Care: Provides financial assistance, online support groups, medication/co-pay assistance.  1-800-813-HOPE (412)506-8872) ? [place] Assists [place] cancer patients and their families through emotional , educational and financial support.   303-210-9998 ? [name] Where to apply for food stamps, Medicaid and utility assistance. (512)842-6147 ? [name]: Transportation to medical appointments. 541-487-1243 ? Social Security Administration: May apply for disability if have a Stage IV cancer. 774-635-2589 225-300-9732 ? [name], Disability and Transit Services: Assists with nutrition, care and transit needs. (832)192-1323

## 2015-09-09 ENCOUNTER — Encounter (HOSPITAL_COMMUNITY): Payer: Self-pay | Admitting: *Deleted

## 2015-09-09 DIAGNOSIS — M25561 Pain in right knee: Secondary | ICD-10-CM | POA: Diagnosis not present

## 2015-09-09 DIAGNOSIS — M170 Bilateral primary osteoarthritis of knee: Secondary | ICD-10-CM | POA: Diagnosis not present

## 2015-09-09 DIAGNOSIS — R2689 Other abnormalities of gait and mobility: Secondary | ICD-10-CM | POA: Diagnosis not present

## 2015-09-09 DIAGNOSIS — M25562 Pain in left knee: Secondary | ICD-10-CM | POA: Diagnosis not present

## 2015-09-09 NOTE — Patient Instructions (Signed)
Pt contacted for 24 hour f/u post restart of Velcade treatment.  Voicemail message left for pt to call the clinic with any questions or concerns.

## 2015-09-17 ENCOUNTER — Ambulatory Visit (HOSPITAL_COMMUNITY): Payer: Medicare Other | Admitting: Oncology

## 2015-09-17 ENCOUNTER — Other Ambulatory Visit (HOSPITAL_COMMUNITY): Payer: Medicare Other

## 2015-09-22 ENCOUNTER — Encounter (HOSPITAL_BASED_OUTPATIENT_CLINIC_OR_DEPARTMENT_OTHER): Payer: Medicare Other

## 2015-09-22 ENCOUNTER — Encounter (HOSPITAL_COMMUNITY): Payer: Self-pay | Admitting: Oncology

## 2015-09-22 ENCOUNTER — Encounter (HOSPITAL_BASED_OUTPATIENT_CLINIC_OR_DEPARTMENT_OTHER): Payer: Medicare Other | Admitting: Oncology

## 2015-09-22 ENCOUNTER — Inpatient Hospital Stay (HOSPITAL_COMMUNITY): Payer: Medicare Other

## 2015-09-22 ENCOUNTER — Encounter (HOSPITAL_COMMUNITY): Payer: Medicare Other

## 2015-09-22 VITALS — BP 140/81 | HR 91 | Temp 98.6°F | Resp 18 | Wt 308.4 lb

## 2015-09-22 DIAGNOSIS — R2689 Other abnormalities of gait and mobility: Secondary | ICD-10-CM | POA: Diagnosis not present

## 2015-09-22 DIAGNOSIS — C9001 Multiple myeloma in remission: Secondary | ICD-10-CM

## 2015-09-22 DIAGNOSIS — C9000 Multiple myeloma not having achieved remission: Secondary | ICD-10-CM

## 2015-09-22 DIAGNOSIS — D72819 Decreased white blood cell count, unspecified: Secondary | ICD-10-CM | POA: Diagnosis not present

## 2015-09-22 DIAGNOSIS — M25561 Pain in right knee: Secondary | ICD-10-CM | POA: Diagnosis not present

## 2015-09-22 DIAGNOSIS — D509 Iron deficiency anemia, unspecified: Secondary | ICD-10-CM | POA: Diagnosis not present

## 2015-09-22 DIAGNOSIS — M170 Bilateral primary osteoarthritis of knee: Secondary | ICD-10-CM | POA: Diagnosis not present

## 2015-09-22 DIAGNOSIS — Z5112 Encounter for antineoplastic immunotherapy: Secondary | ICD-10-CM | POA: Diagnosis not present

## 2015-09-22 DIAGNOSIS — M25562 Pain in left knee: Secondary | ICD-10-CM | POA: Diagnosis not present

## 2015-09-22 LAB — COMPREHENSIVE METABOLIC PANEL
ALK PHOS: 51 U/L (ref 38–126)
ALT: 15 U/L (ref 14–54)
ANION GAP: 7 (ref 5–15)
AST: 19 U/L (ref 15–41)
Albumin: 4.2 g/dL (ref 3.5–5.0)
BILIRUBIN TOTAL: 0.5 mg/dL (ref 0.3–1.2)
BUN: 19 mg/dL (ref 6–20)
CALCIUM: 9.5 mg/dL (ref 8.9–10.3)
CO2: 28 mmol/L (ref 22–32)
CREATININE: 0.89 mg/dL (ref 0.44–1.00)
Chloride: 103 mmol/L (ref 101–111)
Glucose, Bld: 92 mg/dL (ref 65–99)
Potassium: 3.6 mmol/L (ref 3.5–5.1)
SODIUM: 138 mmol/L (ref 135–145)
TOTAL PROTEIN: 7.4 g/dL (ref 6.5–8.1)

## 2015-09-22 LAB — CBC WITH DIFFERENTIAL/PLATELET
Basophils Absolute: 0 10*3/uL (ref 0.0–0.1)
Basophils Relative: 0 %
EOS ABS: 0.1 10*3/uL (ref 0.0–0.7)
Eosinophils Relative: 2 %
HCT: 35 % — ABNORMAL LOW (ref 36.0–46.0)
HEMOGLOBIN: 11.6 g/dL — AB (ref 12.0–15.0)
LYMPHS ABS: 1.4 10*3/uL (ref 0.7–4.0)
LYMPHS PCT: 29 %
MCH: 29.4 pg (ref 26.0–34.0)
MCHC: 33.1 g/dL (ref 30.0–36.0)
MCV: 88.6 fL (ref 78.0–100.0)
MONOS PCT: 7 %
Monocytes Absolute: 0.4 10*3/uL (ref 0.1–1.0)
NEUTROS PCT: 62 %
Neutro Abs: 3 10*3/uL (ref 1.7–7.7)
Platelets: 220 10*3/uL (ref 150–400)
RBC: 3.95 MIL/uL (ref 3.87–5.11)
RDW: 13.3 % (ref 11.5–15.5)
WBC: 4.9 10*3/uL (ref 4.0–10.5)

## 2015-09-22 LAB — C-REACTIVE PROTEIN: CRP: 1.2 mg/dL — ABNORMAL HIGH (ref <1.0)

## 2015-09-22 LAB — MAGNESIUM: MAGNESIUM: 1.8 mg/dL (ref 1.7–2.4)

## 2015-09-22 LAB — LACTATE DEHYDROGENASE: LDH: 137 U/L (ref 98–192)

## 2015-09-22 LAB — SEDIMENTATION RATE: Sed Rate: 50 mm/hr — ABNORMAL HIGH (ref 0–22)

## 2015-09-22 MED ORDER — PROCHLORPERAZINE MALEATE 10 MG PO TABS
ORAL_TABLET | ORAL | Status: AC
  Filled 2015-09-22: qty 1

## 2015-09-22 MED ORDER — HYDROCHLOROTHIAZIDE 25 MG PO TABS: 25.0000 mg | ORAL_TABLET | Freq: Every morning | ORAL | 5 refills | Status: DC

## 2015-09-22 MED ORDER — PROCHLORPERAZINE MALEATE 10 MG PO TABS
10.0000 mg | ORAL_TABLET | Freq: Once | ORAL | Status: AC
  Administered 2015-09-22: 10 mg via ORAL

## 2015-09-22 MED ORDER — BORTEZOMIB CHEMO SQ INJECTION 3.5 MG (2.5MG/ML)
1.3000 mg/m2 | Freq: Once | INTRAMUSCULAR | Status: AC
  Administered 2015-09-22: 3.25 mg via SUBCUTANEOUS
  Filled 2015-09-22: qty 3.25

## 2015-09-22 NOTE — Progress Notes (Signed)
Kelli Hurley presents today for injection per MD orders. Velcade administered SQ in left Abdomen. Administration without incident. Patient tolerated well.  Ambulatory on discharge home to self.

## 2015-09-22 NOTE — Progress Notes (Signed)
[facility] Hematology/Oncology Progress Note   Name: Kelli Hurley      MRN: [id number]    Date: 09/22/2015 Time:9:09 AM   REFERRING PHYSICIAN:  Dr. Charlotte Sanes   DIAGNOSIS:  Multiple Myeloma IgG kappa, STAGE II, serum albumin 3.5 g/dl  HISTORY OF PRESENT ILLNESS:   Kelli Hurley is a 60 year old black American woman with a past medical history significant for HTN and obesity who was referred to [facility] for anemia, leukopenia, and cryglobulinemia. She is here for follow-up of Multiple Myeloma. She has just undergone autologous transplantation at [facility].  Kelli Hurley is unaccompanied. I personally reviewed and went over her post-transplant physician's notes with the patient. Patient is here for ongoing evaluation regarding maintenance therapy for multiple myeloma which patient has been tolerating very well.  Taking her regular medication and prophylactic antibiotics.  Complains of some tingling numbness in the right upper extremity but not interfering with day to day  work  PAST MEDICAL HISTORY:   Past Medical History:  Diagnosis Date  . Anemia   . Claustrophobia 10/05/2014  . Hypertension   . Leukopenia 08/03/2014  . Normocytic hypochromic anemia 08/03/2014    ALLERGIES: No Known Allergies    MEDICATIONS: I have reviewed the patient's current medications.    Current Outpatient Prescriptions on File Prior to Visit  Medication Sig Dispense Refill  . acyclovir (ZOVIRAX) 400 MG tablet TAKE 1 TABLET BY MOUTH TWICE DAILY 60 tablet 2  . ANORO ELLIPTA 62.5-25 MCG/INH AEPB Reported on 1/93/7902    . folic acid (FOLVITE) 1 MG tablet Take 1 mg by mouth.    . hydrochlorothiazide (HYDRODIURIL) 25 MG tablet Take 25 mg by mouth every morning.    . ixazomib citrate (NINLARO) 2.3 MG capsule Take 1 capsule (2.3 mg total) by mouth once a week. Take on an empty stomach 1hr before or 2hrs after food. Do not crush, chew or open. 3 capsule 1  . Multiple Vitamin  (MULTIVITAMIN) tablet Take by mouth.    . ondansetron (ZOFRAN) 8 MG tablet Take 1 tablet (8 mg total) by mouth every 8 (eight) hours as needed for nausea or vomiting. 30 tablet 2  . prochlorperazine (COMPAZINE) 10 MG tablet Take 1 tablet (10 mg total) by mouth every 6 (six) hours as needed for nausea or vomiting. 30 tablet 2   Current Facility-Administered Medications on File Prior to Visit  Medication Dose Route Frequency Provider Last Rate Last Dose  . heparin lock flush 100 unit/mL  500 Units Intravenous Once Patrici Ranks, MD      . sodium chloride 0.9 % injection 10 mL  10 mL Intravenous Once Patrici Ranks, MD         PAST SURGICAL HISTORY Past Surgical History:  Procedure Laterality Date  . ABDOMINAL HYSTERECTOMY    . OTHER SURGICAL HISTORY     heart surgery as infant to "repair hole in heart"    FAMILY HISTORY: Family History  Problem Relation Age of Onset  . Cancer Mother   . Hypertension Mother   . Cancer Father   . Hypertension Father   . Cancer Maternal Grandmother     SOCIAL HISTORY: She reports that she used to smoke as a teenager, but quit > 40 years ago.  She denies EtOH abuse.  She denies illicit drug abuse.  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  She live byherself.  She is divorced with two daughters ages 65 and 34 who are healthy.  Her grandbaby is healthy too.  She is Psychologist, forensic.  PERFORMANCE STATUS: The patient's performance status is 1 - Symptomatic but completely ambulatory  ROS: Gastrointestinal: Negative for diarrhea, constipation. Neurological: Tingling numbness in right upper extremity 14 point review of systems was performed and is negative except as detailed under history of present illness and above  PHYSICAL EXAM: Most Recent Vital Signs: There were no vitals taken for this visit. General appearance: alert, cooperative, appears stated age, no distress and morbidly  obese. Assisted onto exam table. Alopecia Head: Normocephalic, without obvious abnormality, atraumatic Eyes: conjunctivae/corneas clear. PERRL, EOM's intact. Fundi benign. Throat: lips, mucosa, and tongue normal; teeth and gums normal Neck: no adenopathy, supple, symmetrical, trachea midline and thyroid not enlarged, symmetric, no tenderness/mass/nodules Back: symmetric, no curvature. ROM normal. No CVA tenderness. Lungs: clear to auscultation bilaterally and normal percussion bilaterally Heart: regular rate and rhythm, S1, S2 normal, no murmur, click, rub or gallop Abdomen: soft, non-tender; bowel sounds normal; no masses,  no organomegaly Extremities: extremities normal, atraumatic, no cyanosis edema at the ankles bilaterally. Right lower extremity greater than left lower extremity Skin: Skin color, texture, turgor normal. No rashes or lesions Lymph nodes: Cervical, supraclavicular, and axillary nodes normal. Neurologic: Alert and oriented X 3, normal strength and tone. Normal symmetric reflexes. Normal coordination and gait   LABORATORY DATA:  I have reviewed the data as listed. Results for MADDISON, KILNER (MRN [id number]) a  Bone Marrow BiopsyS Diagnosis Bone Marrow, Aspirate,Biopsy, and Clot - NORMOCELLULAR BONE MARROW FOR AGE WITH PLASMA CELL NEOPLASM. - TRILINEAGE HEMATOPOIESIS. - SEE COMMENT. PERIPHERAL BLOOD: - NORMOCYTIC-NORMOCHROMIC ANEMIA - LEUKOPENIA Diagnosis Note Despite limited aspirate material, the plasma cell component is increased in the marrow representing an estimated 18% of all cells with associated atypical cytomorphologic features. Immunohistochemical stains show the the plasma cells are kappa light chain-restricted consistent with plasma cell neoplasm. The background shows trilineage hematopoiesis with non specific changes. Correlation with cytogenetic and FISH studies is recommended. (BNS:ds/gt, 09/04/14) Susanne Greenhouse MD Pathologist, Electronic  Signature (Case signed 09/07/2014)   RADIOGRAPHIC STUDIES: I have reviewed the images below and agree with the results:  Result Impression   No FDG avid osseous or soft tissue lesions are identified. Ancillary CT findings as above.   Result Narrative  PET MULTIPLE MYELOMA (W/LOW DOSE CT), 04/07/2015 10:46 AM  INDICATION: Evaluation for stem cell transplant COMPARISON: Outside facility PET/CT 09/30/2014  TECHNIQUE: 79 minutes after the intravenous injection of 13.218 mCi F-18 FDG, images were obtained from the vertex through the toes. These images were attenuation corrected using CT. Standardized uptake values (SUV) were calculated using a lean body mass algorithm. Blood glucose at the time of injection was 81 mg/dL.  [facility] and its affiliates are committed to minimizing radiation dose to patients while maintaining necessary diagnostic image quality. All CT scans are therefore performed using "As Low As Reasonably Achievable (ALARA)" protocols with either manual or automated exposure controls calibrated to the age and size of each patient.  LIMITATIONS: The low-dose CT acquisition was performed only for attenuation correction/activity localization. There is no intravenous contrast, further limiting the CT component of the study. Patient body habitus further limits diagnostic quality of the CT portion of the exam. This modality has limited utility for detection or characterization of small lung nodules. Evaluation of the vasculature is limited by lack of IV contrast. Evaluation of the  kidney, ureters, and bladder are limited by urinary excretion of radiotracer. Physiologic bowel uptake of FDG and lack of CT contrast limit evaluation of the bowel.   FINDINGS:   HEAD and NECK:     No abnormal FDG uptake is seen in the face, orbits, sinuses, oral cavity, or thyroid. Ancillary head and neck CT findings: None.              CHEST:     No abnormal FDG uptake is seen  in the heart, lungs, pleura, esophagus, hila/mediastinum, axilla, or breasts. Ancillary chest CT findings: None.              ABDOMEN/PELVIS:     No abnormal FDG uptake is seen in the liver, spleen, gallbladder, pancreas, adrenals, peritoneum, extraperitoneum, nodes, or reproductive tract. Ancillary abdomen and pelvis CT findings: None.              MUSCULOSKELETAL:     No abnormal FDG uptake is seen in the soft tissues or bones. Ancillary musculoskeletal CT findings: Bilateral os acromiale. Degenerative changes in the lower back, SI joints, hips, and knees. Calcific tendinitis of the right Achilles tendon.         Expected physiologic activity within the kidneys, ureter, bladder, oropharynx, salivary glands, stomach, bowel and brain.  Results for WRETHA, LARIS (MRN [id number]) as of 09/22/2015 09:09  Ref. Range 09/22/2015 08:45  WBC Latest Ref Range: 4.0 - 10.5 K/uL 4.9  RBC Latest Ref Range: 3.87 - 5.11 MIL/uL 3.95  Hemoglobin Latest Ref Range: 12.0 - 15.0 g/dL 11.6 (L)  HCT Latest Ref Range: 36.0 - 46.0 % 35.0 (L)  MCV Latest Ref Range: 78.0 - 100.0 fL 88.6  MCH Latest Ref Range: 26.0 - 34.0 pg 29.4  MCHC Latest Ref Range: 30.0 - 36.0 g/dL 33.1  RDW Latest Ref Range: 11.5 - 15.5 % 13.3  Platelets Latest Ref Range: 150 - 400 K/uL 220  Neutrophils Latest Units: % 62  Lymphocytes Latest Units: % 29  Monocytes Relative Latest Units: % 7  Eosinophil Latest Units: % 2  Basophil Latest Units: % 0  NEUT# Latest Ref Range: 1.7 - 7.7 K/uL 3.0  Lymphocyte # Latest Ref Range: 0.7 - 4.0 K/uL 1.4  Monocyte # Latest Ref Range: 0.1 - 1.0 K/uL 0.4  Eosinophils Absolute Latest Ref Range: 0.0 - 0.7 K/uL 0.1  Basophils Absolute Latest Ref Range: 0.0 - 0.1 K/uL 0.0    PATHOLOGY:  ACCESSION NUMBER: [id number] RECEIVED: 04/07/2015 ORDERING PHYSICIAN: CESAR [name] , MD PATIENT NAME: Vela Prose BONE MARROW REPORT   Bone Marrow (BM) and Peripheral Blood (PB) FINAL PATHOLOGIC  DIAGNOSIS  BONE MARROW: Normocellular marrow (40%) with approximately 2-4% plasma cells. See comment.  PERIPHERAL BLOOD: Mild normocytic anemia and leucopenia. No rouleaux formation. See CBC data.  COMMENT: The bone marrow aspirate smears are paucispicular, paucicellular and adequate for evaluation. There is no increase in plasma cells (1% by manual differential counts). Blasts are not increased in numbers (less than 1% by manual differential counts). The myeloid and erythroid elements are present in normal proportions with an M: E ratio of 1.8:1. There are no overt dysplastic features of the myeloid or erythroid lineages. Megakaryocytes are quantitatively and qualitatively unremarkable.  Examination of the bone marrow core biopsy is hampered by aspiration artifact, but reveals an overall normocellular marrow for the patient's age (40%). Myeloid and erythroid elements are present in normal proportions. Megakaryocytes are morphologically unremarkable and are present without clustering. There are no atypical  lymphoid aggregates or granulomatous formations identified. CD138 immunohistochemistry reveals approximately 2-4% plasma cells. In situ hybridization could not be performed on the core biopsy. No plasma cell clusters are seen. Examination of the clot sections reveals mostly peripheral blood with a single bone marrow spicule.  The peripheral blood smears reveal no rouleaux formation. The peripheral blood leukocytes are morphologically unremarkable.  The positive immunohistochemical controls were performed and worked appropriately.  CYTOGENETICS Date Ordered: 04/07/2015 Date Reported: 04/13/2015 Interpretation  Laboratory Analysis:  GTG-banded Metaphases: # Cells Karyotyped: Band Resolution:   Molecular Cytogenetic Analysis: (chromosomes pending)   .nuc ish (FGFR3,IgH)x2,(CCND1,IgH)x2,(CEP12,D13S319,LAMP1)x2,(ATM,p53)x2   Molecular Cytogenetic Analysis -  FISH: Normal: The investigative technique of molecular-cytogenetic analysis was performed per physician's request using a DNA probe panel specific for regions associated with chromosomal abnormalities of Multiple Myeloma involving loci of chromosomes 4, 11, 12, 13, 14, and 17 on interphase nuclei.  0% of cells showed no fusion, gain/loss of IGH/FGFR3  This result is below the cut-off value of 2% and is technically negative.    ASSESSMENT/PLAN:  Multiple Myeloma IgG kappa, STAGE II, serum albumin 3.5 g/dl IgG of 04/14/2007 MG/DL, total M spike of 2.7 g/DL Beta-2 microglobulin of 3.7 MG/L Severe Anemia at presentation BMBX with 18% monoclonal plasma cells, kappa light chain restsricted. 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 In 2 weeks patient will come for Velcade after checking CBC and in 4 weeks patient would be reevaluated by oncologist  Dr. Debbrah Alar,  wants her to take her Bactrim MWF and Acyclovir twice daily. Follow up with medical oncology monthly. At her last visit she was to finish her potassium in 2 weeks. They will see her back in July. She has restarted potassium secondary to a recent K of 3.3.  Today 06/07/15 is day +31 after your autologous hematopoietic stem cell transplant (auto-HSCT). We are happy to see that you are feeling well. Please continue to avoid sick contacts, salads at restaurants, open buffets, etc. as you have been doing -- at least until we see you back in clinic.  The next steps are to:  1. Start  taking sulfamethoxazole-trimethoprim (Bactrim) on Monday, Wednesday, and Friday to reduce your risk of atypical/pneumocystis lung infections. 2. Please continue taking acyclovir to reduce your risk of viral infections after transplant. 3. Please follow up with [name] once per month for follow-up. 4. You can reduce potassium to once per day and you can stop taking it in two weeks.  And we will see you back in clinic in July.   If you have any questions/concerns please call our clinic.  Reola Calkins, MD Hematology/Oncology Attending Hydrocodone thiazide has been refilled  She will return in 1 month for follow up.

## 2015-09-22 NOTE — Patient Instructions (Addendum)
[place] at [facility] Discharge Instructions  RECOMMENDATIONS MADE BY THE CONSULTANT AND ANY TEST RESULTS WILL BE SENT TO YOUR REFERRING PHYSICIAN.  You saw Dr. Oliva Bustard today in place of [name]. You are doing well. Labs and treatment in 2 weeks Labs, treatment and follow up with MD in 4 weeks  Thank you for choosing [place] at [facility] to provide your oncology and hematology care.  To afford each patient quality time with our provider, please arrive at least 15 minutes before your scheduled appointment time.   Beginning January 23rd 2017 lab work for the [name] will be done in the  Main lab at [facility] on 1st floor. If you have a lab appointment with the [facility] please come in thru the  Main Entrance and check in at the main information desk  You need to re-schedule your appointment should you arrive 10 or more minutes late.  We strive to give you quality time with our providers, and arriving late affects you and other patients whose appointments are after yours.  Also, if you no show three or more times for appointments you may be dismissed from the clinic at the providers discretion.     Again, thank you for choosing [facility].  Our hope is that these requests will decrease the amount of time that you wait before being seen by our physicians.       _____________________________________________________________  Should you have questions after your visit to [facility], please contact our office at [phone] between the hours of 8:30 a.m. and 4:30 p.m.  Voicemails left after 4:30 p.m. will not be returned until the following business day.  For prescription refill requests, have your pharmacy contact our office.         Resources For Cancer Patients and their Caregivers ? American Cancer Society: Can assist with transportation, wigs, general needs, runs Look Good Feel Better.         ([phone] ? Cancer Care: Provides financial assistance, online support groups, medication/co-pay assistance.  1-800-813-HOPE 9722760201) ? [facility] Assists [place] cancer patients and their families through emotional , educational and financial support.  (438) 544-1581 ? [name] Where to apply for food stamps, Medicaid and utility assistance. 813-403-4179 ? [name]: Transportation to medical appointments. 224-135-9738 ? Social Security Administration: May apply for disability if have a Stage IV cancer. 904-058-7928 (231)761-2651 ? [name], Disability and Transit Services: Assists with nutrition, care and transit needs. [place] Support Programs: @10RELATIVEDAYS @ > Cancer Support Group  2nd Tuesday of the month 1pm-2pm, Journey Room  > Creative Journey  3rd Tuesday of the month 1130am-1pm, Journey Room  > Look Good Feel Better  1st Wednesday of the month 10am-12 noon, Journey Room (Call [place] to register [phone])

## 2015-09-22 NOTE — Patient Instructions (Signed)
Cancer Center Discharge Instructions for Patients Receiving Chemotherapy   Beginning January 23rd 2017 lab work for the Cancer Center will be done in the  Main lab at  on 1st floor. If you have a lab appointment with the Cancer Center please come in thru the  Main Entrance and check in at the main information desk   Today you received the following chemotherapy agents:  Velcade  If you develop nausea and vomiting, or diarrhea that is not controlled by your medication, call the clinic.  The clinic phone number is [phone]. Office hours are Monday-Friday 8:30am-5:00pm.  BELOW ARE SYMPTOMS THAT SHOULD BE REPORTED IMMEDIATELY:  *FEVER GREATER THAN 101.0 F  *CHILLS WITH OR WITHOUT FEVER  NAUSEA AND VOMITING THAT IS NOT CONTROLLED WITH YOUR NAUSEA MEDICATION  *UNUSUAL SHORTNESS OF BREATH  *UNUSUAL BRUISING OR BLEEDING  TENDERNESS IN MOUTH AND THROAT WITH OR WITHOUT PRESENCE OF ULCERS  *URINARY PROBLEMS  *BOWEL PROBLEMS  UNUSUAL RASH Items with * indicate a potential emergency and should be followed up as soon as possible. If you have an emergency after office hours please contact your primary care physician or go to the nearest emergency department.  Please call the clinic during office hours if you have any questions or concerns.   You may also contact the Patient Navigator at [phone] should you have any questions or need assistance in obtaining follow up care.      Resources For Cancer Patients and their Caregivers ? American Cancer Society: Can assist with transportation, wigs, general needs, runs Look Good Feel Better.        1-888-227-6333 ? Cancer Care: Provides financial assistance, online support groups, medication/co-pay assistance.  1-800-813-HOPE (4673) ? [facility] Assists [place] cancer patients and their families through emotional , educational and financial support.   336-427-4357 ? [name] Where to apply for food stamps, Medicaid and utility assistance. 336-342-1394 ? [name]: Transportation to medical appointments. 336-347-2287 ? Social Security Administration: May apply for disability if have a Stage IV cancer. 336-342-7796 1-800-772-1213 ? [place] Aging, Disability and Transit Services: Assists with nutrition, care and transit needs. 336-349-2343         

## 2015-09-23 LAB — KAPPA/LAMBDA LIGHT CHAINS
KAPPA FREE LGHT CHN: 17.6 mg/L (ref 3.3–19.4)
KAPPA, LAMDA LIGHT CHAIN RATIO: 1.57 (ref 0.26–1.65)
LAMDA FREE LIGHT CHAINS: 11.2 mg/L (ref 5.7–26.3)

## 2015-09-23 LAB — PROTEIN ELECTROPHORESIS, SERUM
A/G RATIO SPE: 1.4 (ref 0.7–1.7)
ALBUMIN ELP: 4 g/dL (ref 2.9–4.4)
ALPHA-1-GLOBULIN: 0.2 g/dL (ref 0.0–0.4)
Alpha-2-Globulin: 0.6 g/dL (ref 0.4–1.0)
BETA GLOBULIN: 1 g/dL (ref 0.7–1.3)
GAMMA GLOBULIN: 1.1 g/dL (ref 0.4–1.8)
Globulin, Total: 2.9 g/dL (ref 2.2–3.9)
M-Spike, %: 0.4 g/dL — ABNORMAL HIGH
Total Protein ELP: 6.9 g/dL (ref 6.0–8.5)

## 2015-09-23 LAB — BETA 2 MICROGLOBULIN, SERUM: BETA 2 MICROGLOBULIN: 2.1 mg/L (ref 0.6–2.4)

## 2015-09-23 LAB — IGG, IGA, IGM
IgA: 55 mg/dL — ABNORMAL LOW (ref 87–352)
IgG (Immunoglobin G), Serum: 1160 mg/dL (ref 700–1600)
IgM, Serum: 49 mg/dL (ref 26–217)

## 2015-09-24 LAB — IMMUNOFIXATION ELECTROPHORESIS
IgA: 53 mg/dL — ABNORMAL LOW (ref 87–352)
IgG (Immunoglobin G), Serum: 1140 mg/dL (ref 700–1600)
IgM, Serum: 50 mg/dL (ref 26–217)
Total Protein ELP: 6.8 g/dL (ref 6.0–8.5)

## 2015-09-28 DIAGNOSIS — M170 Bilateral primary osteoarthritis of knee: Secondary | ICD-10-CM | POA: Diagnosis not present

## 2015-09-28 DIAGNOSIS — M25562 Pain in left knee: Secondary | ICD-10-CM | POA: Diagnosis not present

## 2015-09-28 DIAGNOSIS — M25561 Pain in right knee: Secondary | ICD-10-CM | POA: Diagnosis not present

## 2015-09-28 DIAGNOSIS — R2689 Other abnormalities of gait and mobility: Secondary | ICD-10-CM | POA: Diagnosis not present

## 2015-09-29 ENCOUNTER — Other Ambulatory Visit (HOSPITAL_COMMUNITY): Payer: Medicare Other

## 2015-10-06 ENCOUNTER — Encounter (HOSPITAL_COMMUNITY): Payer: Medicare Other

## 2015-10-06 ENCOUNTER — Encounter (HOSPITAL_BASED_OUTPATIENT_CLINIC_OR_DEPARTMENT_OTHER): Payer: Medicare Other

## 2015-10-06 ENCOUNTER — Other Ambulatory Visit (HOSPITAL_COMMUNITY): Payer: Self-pay | Admitting: Oncology

## 2015-10-06 ENCOUNTER — Telehealth (HOSPITAL_COMMUNITY): Payer: Self-pay | Admitting: *Deleted

## 2015-10-06 ENCOUNTER — Encounter (HOSPITAL_COMMUNITY): Payer: Self-pay

## 2015-10-06 VITALS — BP 135/82 | HR 80 | Temp 98.2°F | Resp 18

## 2015-10-06 DIAGNOSIS — D509 Iron deficiency anemia, unspecified: Secondary | ICD-10-CM | POA: Diagnosis not present

## 2015-10-06 DIAGNOSIS — C9000 Multiple myeloma not having achieved remission: Secondary | ICD-10-CM | POA: Diagnosis not present

## 2015-10-06 DIAGNOSIS — C9001 Multiple myeloma in remission: Secondary | ICD-10-CM

## 2015-10-06 DIAGNOSIS — Z5112 Encounter for antineoplastic immunotherapy: Secondary | ICD-10-CM | POA: Diagnosis present

## 2015-10-06 DIAGNOSIS — E876 Hypokalemia: Secondary | ICD-10-CM

## 2015-10-06 DIAGNOSIS — M25561 Pain in right knee: Secondary | ICD-10-CM | POA: Diagnosis not present

## 2015-10-06 DIAGNOSIS — M25562 Pain in left knee: Secondary | ICD-10-CM | POA: Diagnosis not present

## 2015-10-06 DIAGNOSIS — R2689 Other abnormalities of gait and mobility: Secondary | ICD-10-CM | POA: Diagnosis not present

## 2015-10-06 DIAGNOSIS — M170 Bilateral primary osteoarthritis of knee: Secondary | ICD-10-CM | POA: Diagnosis not present

## 2015-10-06 DIAGNOSIS — D72819 Decreased white blood cell count, unspecified: Secondary | ICD-10-CM | POA: Diagnosis not present

## 2015-10-06 LAB — COMPREHENSIVE METABOLIC PANEL
ALBUMIN: 4.2 g/dL (ref 3.5–5.0)
ALT: 12 U/L — AB (ref 14–54)
ANION GAP: 9 (ref 5–15)
AST: 20 U/L (ref 15–41)
Alkaline Phosphatase: 48 U/L (ref 38–126)
BUN: 20 mg/dL (ref 6–20)
CALCIUM: 9.5 mg/dL (ref 8.9–10.3)
CHLORIDE: 103 mmol/L (ref 101–111)
CO2: 28 mmol/L (ref 22–32)
CREATININE: 0.9 mg/dL (ref 0.44–1.00)
GFR calc non Af Amer: >60 mL/min (ref >60)
GLUCOSE: 84 mg/dL (ref 65–99)
Potassium: 3.2 mmol/L — ABNORMAL LOW (ref 3.5–5.1)
SODIUM: 140 mmol/L (ref 135–145)
TOTAL PROTEIN: 7.3 g/dL (ref 6.5–8.1)
Total Bilirubin: 0.5 mg/dL (ref 0.3–1.2)

## 2015-10-06 LAB — CBC WITH DIFFERENTIAL/PLATELET
BASOS ABS: 0 10*3/uL (ref 0.0–0.1)
BASOS PCT: 0 %
EOS ABS: 0.1 10*3/uL (ref 0.0–0.7)
EOS PCT: 2 %
HCT: 34.8 % — ABNORMAL LOW (ref 36.0–46.0)
HEMOGLOBIN: 11.5 g/dL — AB (ref 12.0–15.0)
LYMPHS ABS: 1.2 10*3/uL (ref 0.7–4.0)
Lymphocytes Relative: 25 %
MCH: 28.8 pg (ref 26.0–34.0)
MCHC: 33 g/dL (ref 30.0–36.0)
MCV: 87.2 fL (ref 78.0–100.0)
Monocytes Absolute: 0.4 10*3/uL (ref 0.1–1.0)
Monocytes Relative: 8 %
NEUTROS PCT: 65 %
Neutro Abs: 3.2 10*3/uL (ref 1.7–7.7)
PLATELETS: 215 10*3/uL (ref 150–400)
RBC: 3.99 MIL/uL (ref 3.87–5.11)
RDW: 13 % (ref 11.5–15.5)
WBC: 5 10*3/uL (ref 4.0–10.5)

## 2015-10-06 MED ORDER — BORTEZOMIB CHEMO SQ INJECTION 3.5 MG (2.5MG/ML)
1.3000 mg/m2 | Freq: Once | INTRAMUSCULAR | Status: AC
  Administered 2015-10-06: 3.25 mg via SUBCUTANEOUS
  Filled 2015-10-06: qty 3.25

## 2015-10-06 MED ORDER — PROCHLORPERAZINE MALEATE 10 MG PO TABS
10.0000 mg | ORAL_TABLET | Freq: Once | ORAL | Status: AC
  Administered 2015-10-06: 10 mg via ORAL
  Filled 2015-10-06: qty 1

## 2015-10-06 MED ORDER — POTASSIUM CHLORIDE CRYS ER 20 MEQ PO TBCR: 20.0000 meq | EXTENDED_RELEASE_TABLET | Freq: Two times a day (BID) | ORAL | 1 refills | Status: DC

## 2015-10-06 NOTE — Patient Instructions (Signed)
[place] at [facility] Discharge Instructions  RECOMMENDATIONS MADE BY THE CONSULTANT AND ANY TEST RESULTS WILL BE SENT TO YOUR REFERRING PHYSICIAN.  Velcade injection today. Return as scheduled for lab work, office visit, and chemotherapy.   Thank you for choosing [place] at [facility] to provide your oncology and hematology care.  To afford each patient quality time with our provider, please arrive at least 15 minutes before your scheduled appointment time.   Beginning January 23rd 2017 lab work for the [name] will be done in the  Main lab at [facility] on 1st floor. If you have a lab appointment with the [facility] please come in thru the  Main Entrance and check in at the main information desk  You need to re-schedule your appointment should you arrive 10 or more minutes late.  We strive to give you quality time with our providers, and arriving late affects you and other patients whose appointments are after yours.  Also, if you no show three or more times for appointments you may be dismissed from the clinic at the providers discretion.     Again, thank you for choosing [facility].  Our hope is that these requests will decrease the amount of time that you wait before being seen by our physicians.       _____________________________________________________________  Should you have questions after your visit to [facility], please contact our office at [phone] between the hours of 8:30 a.m. and 4:30 p.m.  Voicemails left after 4:30 p.m. will not be returned until the following business day.  For prescription refill requests, have your pharmacy contact our office.         Resources For Cancer Patients and their Caregivers ? American Cancer Society: Can assist with transportation, wigs, general needs, runs Look Good Feel Better.        216-606-8759 ? Cancer Care: Provides financial  assistance, online support groups, medication/co-pay assistance.  1-800-813-HOPE (272)790-8179) ? [facility] Assists [place] cancer patients and their families through emotional , educational and financial support.  575 115 6010 ? [name] Where to apply for food stamps, Medicaid and utility assistance. 540-532-6564 ? [name]: Transportation to medical appointments. (813)682-6541 ? Social Security Administration: May apply for disability if have a Stage IV cancer. (228)010-4240 212 692 0885 ? [name], Disability and Transit Services: Assists with nutrition, care and transit needs. [place] Support Programs: @10RELATIVEDAYS @ > Cancer Support Group  2nd Tuesday of the month 1pm-2pm, Journey Room  > Creative Journey  3rd Tuesday of the month 1130am-1pm, Journey Room  > Look Good Feel Better  1st Wednesday of the month 10am-12 noon, Journey Room (Call [place] to register [phone])

## 2015-10-06 NOTE — Telephone Encounter (Signed)
-----   Message from [facility], PA-C sent at 10/06/2015 12:46 PM EDT ----- Kdur 40 mEq daily.  Medication escribed to her pharmacy

## 2015-10-06 NOTE — Telephone Encounter (Signed)
Aware of Rx being sent to her pharmacy and that she should take it daily. Pt verbalized understanding.

## 2015-10-06 NOTE — Progress Notes (Signed)
1135:  Kelli Hurley presents today for injection per the provider's orders.  Velcade administration without incident; see MAR for injection details.  Patient tolerated procedure well and without incident.  Alert, in no distress.  Discharged ambulatory.

## 2015-10-06 NOTE — Progress Notes (Signed)
kdur  

## 2015-10-18 NOTE — Progress Notes (Signed)
[facility] Hematology/Oncology Progress Note   Name: Kelli Hurley      MRN: [id number]    Date: 10/20/2015 Time:10:39 AM   REFERRING PHYSICIAN:  Dr. Charlotte Sanes   DIAGNOSIS:  Multiple Myeloma IgG kappa, STAGE II, serum albumin 3.5 g/dl  HISTORY OF PRESENT ILLNESS:   Kelli Hurley is a 60 year old black American woman with a past medical history significant for HTN and obesity who was referred to [facility] for anemia, leukopenia, and cryglobulinemia. She is here for follow-up of Multiple Myeloma. She has undergone autologous transplantation at [facility]  Kelli Hurley is unaccompanied. She is here for ongoing velcade. I personally reviewed and went over laboratory studies with the patient.  Her hands and feet feel fine. She reports they begin to tingle when she goes to bed at night. She has no difficulty walking or buttoning buttons.   She reports gaining weight and is curious if the medication would cause this. She tries to eat at a certain time everyday and does not want to put weight back on. [facility].   She jokes that her family no longer spoils her. Stating she got spoiled up in [place]. She has started going back to church and everyone has been happy to see her.  She denies severe swelling in her legs.  She needs to call to get an appointment for a screening mammogram.    PAST MEDICAL HISTORY:   Past Medical History:  Diagnosis Date  . Anemia   . Claustrophobia 10/05/2014  . Hypertension   . Leukopenia 08/03/2014  . Normocytic hypochromic anemia 08/03/2014    ALLERGIES: No Known Allergies    MEDICATIONS: I have reviewed the patient's current medications.    Current Outpatient Prescriptions on File Prior to Visit  Medication Sig Dispense Refill  . acyclovir (ZOVIRAX) 400 MG tablet TAKE 1 TABLET BY MOUTH TWICE DAILY 60 tablet 2  . folic acid (FOLVITE) 1 MG tablet Take  1 mg by mouth.    . hydrochlorothiazide (HYDRODIURIL) 25 MG tablet Take 1 tablet (25 mg total) by mouth every morning. 30 tablet 5  . Multiple Vitamin (MULTIVITAMIN) tablet Take by mouth.    . potassium chloride SA (K-DUR,KLOR-CON) 20 MEQ tablet Take 1 tablet (20 mEq total) by mouth 2 (two) times daily. 60 tablet 1   Current Facility-Administered Medications on File Prior to Visit  Medication Dose Route Frequency Provider Last Rate Last Dose  . heparin lock flush 100 unit/mL  500 Units Intravenous Once Patrici Ranks, MD      . sodium chloride 0.9 % injection 10 mL  10 mL Intravenous Once Patrici Ranks, MD         PAST SURGICAL HISTORY Past Surgical History:  Procedure Laterality Date  . ABDOMINAL HYSTERECTOMY    . OTHER SURGICAL HISTORY     heart surgery as infant to "repair hole in heart"    FAMILY HISTORY: Family History  Problem Relation Age of Onset  . Cancer Mother   . Hypertension Mother   . Cancer Father   . Hypertension Father   . Cancer Maternal Grandmother     SOCIAL HISTORY: She reports that she used to smoke as a teenager, but quit > 40 years ago.  She denies EtOH abuse.  She denies illicit drug abuse.  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  She live byherself.  She is divorced with two daughters ages 74 and 63 who are healthy.  Her grandbaby is healthy too.  She is Psychologist, forensic.  PERFORMANCE STATUS: The patient's performance status is 1 - Symptomatic but completely ambulatory  ROS: Gastrointestinal: Negative for diarrhea, constipation. Neurological: Negative for neuropathy. 14 point review of systems was performed and is negative except as detailed under history of present illness and above   PHYSICAL EXAM: Most Recent Vital Signs: Blood pressure (!) 154/77, pulse 73, temperature 98.2 F (36.8 C), temperature source Oral, resp. rate 18, weight (!) 315 lb (142.9 kg), SpO2 100 %. General  appearance: alert, cooperative, appears stated age, no distress and morbidly obese. Assisted onto exam table.  Head: Normocephalic, without obvious abnormality, atraumatic Eyes: conjunctivae/corneas clear. PERRL, EOM's intact. Fundi benign. Throat: lips, mucosa, and tongue normal; teeth and gums normal Neck: no adenopathy, supple, symmetrical, trachea midline and thyroid not enlarged, symmetric, no tenderness/mass/nodules Back: symmetric, no curvature. ROM normal. No CVA tenderness. Lungs: clear to auscultation bilaterally and normal percussion bilaterally Heart: regular rate and rhythm, S1, S2 normal, no murmur, click, rub or gallop Abdomen: soft, non-tender; bowel sounds normal; no masses,  no organomegaly Extremities: extremities normal, atraumatic, no cyanosis. Trace edema noted in bilateral lower extremities. Skin: Skin color, texture, turgor normal. No rashes or lesions Lymph nodes: Cervical, supraclavicular, and axillary nodes normal. Neurologic: Alert and oriented X 3, normal strength and tone. Normal symmetric reflexes. Normal coordination and gait   LABORATORY DATA:  I have reviewed the data as listed. Results for TINSLEIGH, SLOVACEK (MRN [id number]) as of 10/20/2015 10:39  Ref. Range 10/20/2015 09:05  Sodium Latest Ref Range: 135 - 145 mmol/L 139  Potassium Latest Ref Range: 3.5 - 5.1 mmol/L 3.7  Chloride Latest Ref Range: 101 - 111 mmol/L 103  CO2 Latest Ref Range: 22 - 32 mmol/L 28  BUN Latest Ref Range: 6 - 20 mg/dL 17  Creatinine Latest Ref Range: 0.44 - 1.00 mg/dL 0.88  Calcium Latest Ref Range: 8.9 - 10.3 mg/dL 9.8  EGFR (Non-African Amer.) Latest Ref Range: >60 mL/min >60  EGFR (African American) Latest Ref Range: >60 mL/min >60  Glucose Latest Ref Range: 65 - 99 mg/dL 96  Anion gap Latest Ref Range: 5 - 15  8  Magnesium Latest Ref Range: 1.7 - 2.4 mg/dL 1.7  Alkaline Phosphatase Latest Ref Range: 38 - 126 U/L 53  Albumin Latest Ref Range: 3.5 - 5.0 g/dL 4.2  AST  Latest Ref Range: 15 - 41 U/L 19  ALT Latest Ref Range: 14 - 54 U/L 12 (L)  Total Protein Latest Ref Range: 6.5 - 8.1 g/dL 7.4  Total Bilirubin Latest Ref Range: 0.3 - 1.2 mg/dL 0.5  LDH Latest Ref Range: 98 - 192 U/L 152  WBC Latest Ref Range: 4.0 - 10.5 K/uL 4.1  RBC Latest Ref Range: 3.87 - 5.11 MIL/uL 3.91  Hemoglobin Latest Ref Range: 12.0 - 15.0 g/dL 11.2 (L)  HCT Latest Ref Range: 36.0 - 46.0 % 34.1 (L)  MCV Latest Ref Range: 78.0 - 100.0 fL 87.2  MCH Latest Ref Range: 26.0 - 34.0 pg 28.6  MCHC Latest Ref Range: 30.0 - 36.0 g/dL 32.8  RDW Latest Ref Range: 11.5 - 15.5 % 13.4  Platelets Latest Ref Range: 150 - 400 K/uL 197  Neutrophils Latest Units: % 64  Lymphocytes Latest Units: % 26  Monocytes Relative Latest Units: % 8  Eosinophil Latest Units: % 2  Basophil Latest Units: % 0  NEUT# Latest Ref Range: 1.7 - 7.7 K/uL 2.6  Lymphocyte # Latest Ref Range: 0.7 - 4.0 K/uL 1.1  Monocyte # Latest Ref Range: 0.1 - 1.0 K/uL 0.3  Eosinophils Absolute Latest Ref Range: 0.0 - 0.7 K/uL 0.1  Basophils Absolute Latest Ref Range: 0.0 - 0.1 K/uL 0.0  Sed Rate Latest Ref Range: 0 - 22 mm/hr 55 (H)    Bone Marrow BiopsyS Diagnosis Bone Marrow, Aspirate,Biopsy, and Clot - NORMOCELLULAR BONE MARROW FOR AGE WITH PLASMA CELL NEOPLASM. - TRILINEAGE HEMATOPOIESIS. - SEE COMMENT. PERIPHERAL BLOOD: - NORMOCYTIC-NORMOCHROMIC ANEMIA - LEUKOPENIA Diagnosis Note Despite limited aspirate material, the plasma cell component is increased in the marrow representing an estimated 18% of all cells with associated atypical cytomorphologic features. Immunohistochemical stains show the the plasma cells are kappa light chain-restricted consistent with plasma cell neoplasm. The background shows trilineage hematopoiesis with non specific changes. Correlation with cytogenetic and FISH studies is recommended. (BNS:ds/gt, 09/04/14) Susanne Greenhouse MD Pathologist, Electronic Signature (Case signed  09/07/2014)   RADIOGRAPHIC STUDIES: I have reviewed the images below and agree with the results:  Result Impression   No FDG avid osseous or soft tissue lesions are identified. Ancillary CT findings as above.   Result Narrative  PET MULTIPLE MYELOMA (W/LOW DOSE CT), 04/07/2015 10:46 AM  INDICATION: Evaluation for stem cell transplant COMPARISON: Outside facility PET/CT 09/30/2014  TECHNIQUE: 79 minutes after the intravenous injection of 13.218 mCi F-18 FDG, images were obtained from the vertex through the toes. These images were attenuation corrected using CT. Standardized uptake values (SUV) were calculated using a lean body mass algorithm. Blood glucose at the time of injection was 81 mg/dL.  [facility] and its affiliates are committed to minimizing radiation dose to patients while maintaining necessary diagnostic image quality. All CT scans are therefore performed using "As Low As Reasonably Achievable (ALARA)" protocols with either manual or automated exposure controls calibrated to the age and size of each patient.  LIMITATIONS: The low-dose CT acquisition was performed only for attenuation correction/activity localization. There is no intravenous contrast, further limiting the CT component of the study. Patient body habitus further limits diagnostic quality of the CT portion of the exam. This modality has limited utility for detection or characterization of small lung nodules. Evaluation of the vasculature is limited by lack of IV contrast. Evaluation of the kidney, ureters, and bladder are limited by urinary excretion of radiotracer. Physiologic bowel uptake of FDG and lack of CT contrast limit evaluation of the bowel.   FINDINGS:   HEAD and NECK:     No abnormal FDG uptake is seen in the face, orbits, sinuses, oral cavity, or thyroid. Ancillary head and neck CT findings: None.              CHEST:     No abnormal FDG uptake is seen in the heart, lungs,  pleura, esophagus, hila/mediastinum, axilla, or breasts. Ancillary chest CT findings: None.              ABDOMEN/PELVIS:     No abnormal FDG uptake is seen in the liver, spleen, gallbladder, pancreas, adrenals, peritoneum, extraperitoneum, nodes, or reproductive tract. Ancillary abdomen and pelvis CT findings: None.              MUSCULOSKELETAL:     No abnormal FDG uptake is seen in the soft tissues or bones. Ancillary musculoskeletal CT findings: Bilateral os acromiale. Degenerative changes in  the lower back, SI joints, hips, and knees. Calcific tendinitis of the right Achilles tendon.         Expected physiologic activity within the kidneys, ureter, bladder, oropharynx, salivary glands, stomach, bowel and brain.    PATHOLOGY:  ACCESSION NUMBER: [id number] RECEIVED: 04/07/2015 ORDERING PHYSICIAN: CESAR [name] , MD PATIENT NAME: Vela Prose BONE MARROW REPORT   Bone Marrow (BM) and Peripheral Blood (PB) FINAL PATHOLOGIC DIAGNOSIS  BONE MARROW: Normocellular marrow (40%) with approximately 2-4% plasma cells. See comment.  PERIPHERAL BLOOD: Mild normocytic anemia and leucopenia. No rouleaux formation. See CBC data.  COMMENT: The bone marrow aspirate smears are paucispicular, paucicellular and adequate for evaluation. There is no increase in plasma cells (1% by manual differential counts). Blasts are not increased in numbers (less than 1% by manual differential counts). The myeloid and erythroid elements are present in normal proportions with an M: E ratio of 1.8:1. There are no overt dysplastic features of the myeloid or erythroid lineages. Megakaryocytes are quantitatively and qualitatively unremarkable.  Examination of the bone marrow core biopsy is hampered by aspiration artifact, but reveals an overall normocellular marrow for the patient's age (40%). Myeloid and erythroid elements are present in normal proportions. Megakaryocytes are  morphologically unremarkable and are present without clustering. There are no atypical lymphoid aggregates or granulomatous formations identified. CD138 immunohistochemistry reveals approximately 2-4% plasma cells. In situ hybridization could not be performed on the core biopsy. No plasma cell clusters are seen. Examination of the clot sections reveals mostly peripheral blood with a single bone marrow spicule.  The peripheral blood smears reveal no rouleaux formation. The peripheral blood leukocytes are morphologically unremarkable.  The positive immunohistochemical controls were performed and worked appropriately.  CYTOGENETICS Date Ordered: 04/07/2015 Date Reported: 04/13/2015 Interpretation  Laboratory Analysis:  GTG-banded Metaphases: # Cells Karyotyped: Band Resolution:   Molecular Cytogenetic Analysis: (chromosomes pending)   .nuc ish (FGFR3,IgH)x2,(CCND1,IgH)x2,(CEP12,D13S319,LAMP1)x2,(ATM,p53)x2   Molecular Cytogenetic Analysis - FISH: Normal: The investigative technique of molecular-cytogenetic analysis was performed per physician's request using a DNA probe panel specific for regions associated with chromosomal abnormalities of Multiple Myeloma involving loci of chromosomes 4, 11, 12, 13, 14, and 17 on interphase nuclei.  0% of cells showed no fusion, gain/loss of IGH/FGFR3  This result is below the cut-off value of 2% and is technically negative.    ASSESSMENT/PLAN:  Multiple Myeloma IgG kappa, STAGE II, serum albumin 3.5 g/dl IgG of 04/14/2007 MG/DL, total M spike of 2.7 g/DL Beta-2 microglobulin of 3.7 MG/L Severe Anemia at presentation BMBX with 18% monoclonal plasma cells, kappa light chain restsricted. 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 She looks great.  She is encouraged to increase her physical activity and does have [facility]   The patient is here for ongoing maintenance Velcade.   Labs reviewed. Myeloma labs pending, we will call with these results. She still has a small M spike detectable by electrophoresis and immunofixation -- stable. Kappa/lambda ratio is WNL.   Last seen by Dr. Tiana Loft on 08/23/2015. She will see them again in October. She will be receiving vaccines in October at [facility].  She will return in 1 month for follow up.  Orders Placed This Encounter  Procedures  . CBC with Differential    Standing Status:   Future    Standing Expiration Date:   10/19/2016  . Comprehensive metabolic panel    Standing Status:   Future    Standing Expiration Date:   10/19/2016    All questions were answered. The patient knows to call the clinic with any problems, questions or concerns. We can certainly see the patient much sooner if necessary.   This document serves as a record of services personally performed by Ancil Linsey, MD. It was created on her behalf by Arlyce Harman, a trained medical scribe. The creation of this record is based on the scribe's personal observations and the provider's statements to them. This document has been checked and approved by the attending provider.  I have reviewed the above documentation for accuracy and completeness, and I agree with the above.  This note is electronically signed.  Kelby Fam. Whitney Muse, MD

## 2015-10-20 ENCOUNTER — Encounter (HOSPITAL_BASED_OUTPATIENT_CLINIC_OR_DEPARTMENT_OTHER): Payer: Medicare Other

## 2015-10-20 ENCOUNTER — Encounter (HOSPITAL_COMMUNITY): Payer: Medicare Other

## 2015-10-20 ENCOUNTER — Encounter (HOSPITAL_COMMUNITY): Payer: Medicare Other | Attending: Hematology & Oncology | Admitting: Hematology & Oncology

## 2015-10-20 ENCOUNTER — Encounter (HOSPITAL_COMMUNITY): Payer: Self-pay | Admitting: Hematology & Oncology

## 2015-10-20 VITALS — BP 154/77 | HR 73 | Temp 98.2°F | Resp 18 | Wt 315.0 lb

## 2015-10-20 DIAGNOSIS — I10 Essential (primary) hypertension: Secondary | ICD-10-CM | POA: Diagnosis not present

## 2015-10-20 DIAGNOSIS — C9000 Multiple myeloma not having achieved remission: Secondary | ICD-10-CM | POA: Insufficient documentation

## 2015-10-20 DIAGNOSIS — Z5112 Encounter for antineoplastic immunotherapy: Secondary | ICD-10-CM

## 2015-10-20 DIAGNOSIS — Z79899 Other long term (current) drug therapy: Secondary | ICD-10-CM | POA: Diagnosis not present

## 2015-10-20 DIAGNOSIS — C9001 Multiple myeloma in remission: Secondary | ICD-10-CM

## 2015-10-20 DIAGNOSIS — E669 Obesity, unspecified: Secondary | ICD-10-CM | POA: Diagnosis not present

## 2015-10-20 LAB — COMPREHENSIVE METABOLIC PANEL
ALK PHOS: 53 U/L (ref 38–126)
ALT: 12 U/L — AB (ref 14–54)
ANION GAP: 8 (ref 5–15)
AST: 19 U/L (ref 15–41)
Albumin: 4.2 g/dL (ref 3.5–5.0)
BUN: 17 mg/dL (ref 6–20)
CALCIUM: 9.8 mg/dL (ref 8.9–10.3)
CO2: 28 mmol/L (ref 22–32)
CREATININE: 0.88 mg/dL (ref 0.44–1.00)
Chloride: 103 mmol/L (ref 101–111)
Glucose, Bld: 96 mg/dL (ref 65–99)
Potassium: 3.7 mmol/L (ref 3.5–5.1)
Sodium: 139 mmol/L (ref 135–145)
TOTAL PROTEIN: 7.4 g/dL (ref 6.5–8.1)
Total Bilirubin: 0.5 mg/dL (ref 0.3–1.2)

## 2015-10-20 LAB — CBC WITH DIFFERENTIAL/PLATELET
Basophils Absolute: 0 10*3/uL (ref 0.0–0.1)
Basophils Relative: 0 %
Eosinophils Absolute: 0.1 10*3/uL (ref 0.0–0.7)
Eosinophils Relative: 2 %
HEMATOCRIT: 34.1 % — AB (ref 36.0–46.0)
Hemoglobin: 11.2 g/dL — ABNORMAL LOW (ref 12.0–15.0)
LYMPHS PCT: 26 %
Lymphs Abs: 1.1 10*3/uL (ref 0.7–4.0)
MCH: 28.6 pg (ref 26.0–34.0)
MCHC: 32.8 g/dL (ref 30.0–36.0)
MCV: 87.2 fL (ref 78.0–100.0)
MONO ABS: 0.3 10*3/uL (ref 0.1–1.0)
MONOS PCT: 8 %
NEUTROS ABS: 2.6 10*3/uL (ref 1.7–7.7)
Neutrophils Relative %: 64 %
Platelets: 197 10*3/uL (ref 150–400)
RBC: 3.91 MIL/uL (ref 3.87–5.11)
RDW: 13.4 % (ref 11.5–15.5)
WBC: 4.1 10*3/uL (ref 4.0–10.5)

## 2015-10-20 LAB — SEDIMENTATION RATE: Sed Rate: 55 mm/hr — ABNORMAL HIGH (ref 0–22)

## 2015-10-20 LAB — MAGNESIUM: Magnesium: 1.7 mg/dL (ref 1.7–2.4)

## 2015-10-20 LAB — C-REACTIVE PROTEIN: CRP: 1.8 mg/dL — ABNORMAL HIGH (ref <1.0)

## 2015-10-20 LAB — LACTATE DEHYDROGENASE: LDH: 152 U/L (ref 98–192)

## 2015-10-20 MED ORDER — PROCHLORPERAZINE MALEATE 10 MG PO TABS
10.0000 mg | ORAL_TABLET | Freq: Once | ORAL | Status: AC
  Administered 2015-10-20: 10 mg via ORAL

## 2015-10-20 MED ORDER — PROCHLORPERAZINE MALEATE 10 MG PO TABS
ORAL_TABLET | ORAL | Status: AC
  Filled 2015-10-20: qty 1

## 2015-10-20 MED ORDER — BORTEZOMIB CHEMO SQ INJECTION 3.5 MG (2.5MG/ML)
1.3000 mg/m2 | Freq: Once | INTRAMUSCULAR | Status: AC
  Administered 2015-10-20: 3.25 mg via SUBCUTANEOUS
  Filled 2015-10-20: qty 3.25

## 2015-10-20 NOTE — Progress Notes (Signed)
Kelli Hurley [place] tolerated Velcade injection ,per tx plan, well without complaints. Pt will receive her Influenza vaccine at [facility] per pt. Pt discharged self ambulatory in satisfactory condition

## 2015-10-20 NOTE — Patient Instructions (Signed)
[facility] Discharge Instructions for Patients Receiving Chemotherapy   Beginning January 23rd 2017 lab work for the Cancer Center will be done in the  Main lab at [facility] on 1st floor. If you have a lab appointment with the Cancer Center please come in thru the  Main Entrance and check in at the main information desk   Today you received the following chemotherapy agents Velcade. Follow-up as scheduled. Call clinic for any questions or concerns  To help prevent nausea and vomiting after your treatment, we encourage you to take your nausea medication   If you develop nausea and vomiting, or diarrhea that is not controlled by your medication, call the clinic.  The clinic phone number is [phone]. Office hours are Monday-Friday 8:30am-5:00pm.  BELOW ARE SYMPTOMS THAT SHOULD BE REPORTED IMMEDIATELY:  *FEVER GREATER THAN 101.0 F  *CHILLS WITH OR WITHOUT FEVER  NAUSEA AND VOMITING THAT IS NOT CONTROLLED WITH YOUR NAUSEA MEDICATION  *UNUSUAL SHORTNESS OF BREATH  *UNUSUAL BRUISING OR BLEEDING  TENDERNESS IN MOUTH AND THROAT WITH OR WITHOUT PRESENCE OF ULCERS  *URINARY PROBLEMS  *BOWEL PROBLEMS  UNUSUAL RASH Items with * indicate a potential emergency and should be followed up as soon as possible. If you have an emergency after office hours please contact your primary care physician or go to the nearest emergency department.  Please call the clinic during office hours if you have any questions or concerns.   You may also contact the Patient Navigator at [phone] should you have any questions or need assistance in obtaining follow up care.      Resources For Cancer Patients and their Caregivers ? American Cancer Society: Can assist with transportation, wigs, general needs, runs Look Good Feel Better.        1-888-227-6333 ? Cancer Care: Provides financial assistance, online support groups, medication/co-pay assistance.  1-800-813-HOPE  (4673) ? [facility] Assists [place] cancer patients and their families through emotional , educational and financial support.  336-427-4357 ? [name] Where to apply for food stamps, Medicaid and utility assistance. 336-342-1394 ? [name]: Transportation to medical appointments. 336-347-2287 ? Social Security Administration: May apply for disability if have a Stage IV cancer. 336-342-7796 1-800-772-1213 ? [place] Aging, Disability and Transit Services: Assists with nutrition, care and transit needs. 336-349-2343         

## 2015-10-20 NOTE — Patient Instructions (Addendum)
[place] [facility] Discharge Instructions  RECOMMENDATIONS MADE BY THE CONSULTANT AND ANY TEST RESULTS WILL BE SENT TO YOUR REFERRING PHYSICIAN.  You saw [name] today. Follow up in 1 month with labs. Continue Velcade.  Thank you for choosing [place] at [facility] to provide your oncology and hematology care.  To afford each patient quality time with our provider, please arrive at least 15 minutes before your scheduled appointment time.   Beginning January 23rd 2017 lab work for the [name] will be done in the  Main lab at [facility] on 1st floor. If you have a lab appointment with the [facility] please come in thru the  Main Entrance and check in at the main information desk  You need to re-schedule your appointment should you arrive 10 or more minutes late.  We strive to give you quality time with our providers, and arriving late affects you and other patients whose appointments are after yours.  Also, if you no show three or more times for appointments you may be dismissed from the clinic at the providers discretion.     Again, thank you for choosing [facility].  Our hope is that these requests will decrease the amount of time that you wait before being seen by our physicians.       _____________________________________________________________  Should you have questions after your visit to [facility], please contact our office at [phone] between the hours of 8:30 a.m. and 4:30 p.m.  Voicemails left after 4:30 p.m. will not be returned until the following business day.  For prescription refill requests, have your pharmacy contact our office.         Resources For Cancer Patients and their Caregivers ? American Cancer Society: Can assist with transportation, wigs, general needs, runs Look Good Feel Better.        (743)016-0713 ? Cancer Care: Provides financial assistance, online  support groups, medication/co-pay assistance.  1-800-813-HOPE 878-063-9522) ? [facility] Assists [place] cancer patients and their families through emotional , educational and financial support.  (303)095-3737 ? [name] Where to apply for food stamps, Medicaid and utility assistance. 905-694-4949 ? [name]: Transportation to medical appointments. (701)268-5409 ? Social Security Administration: May apply for disability if have a Stage IV cancer. 929-216-6906 667-402-1388 ? [name], Disability and Transit Services: Assists with nutrition, care and transit needs. [facility] Support Programs: @10RELATIVEDAYS @ > Cancer Support Group  2nd Tuesday of the month 1pm-2pm, Journey Room  > Creative Journey  3rd Tuesday of the month 1130am-1pm, Journey Room  > Look Good Feel Better  1st Wednesday of the month 10am-12 noon, Journey Room (Call [place] to register [phone])

## 2015-10-21 LAB — KAPPA/LAMBDA LIGHT CHAINS
KAPPA, LAMDA LIGHT CHAIN RATIO: 1.42 (ref 0.26–1.65)
Kappa free light chain: 16.9 mg/L (ref 3.3–19.4)
LAMDA FREE LIGHT CHAINS: 11.9 mg/L (ref 5.7–26.3)

## 2015-10-21 LAB — PROTEIN ELECTROPHORESIS, SERUM
A/G Ratio: 1.2 (ref 0.7–1.7)
ALBUMIN ELP: 3.8 g/dL (ref 2.9–4.4)
ALPHA-1-GLOBULIN: 0.2 g/dL (ref 0.0–0.4)
ALPHA-2-GLOBULIN: 0.7 g/dL (ref 0.4–1.0)
BETA GLOBULIN: 1.1 g/dL (ref 0.7–1.3)
GAMMA GLOBULIN: 1.2 g/dL (ref 0.4–1.8)
Globulin, Total: 3.1 g/dL (ref 2.2–3.9)
M-Spike, %: 0.5 g/dL — ABNORMAL HIGH
Total Protein ELP: 6.9 g/dL (ref 6.0–8.5)

## 2015-10-21 LAB — IGG, IGA, IGM
IGM, SERUM: 47 mg/dL (ref 26–217)
IgA: 58 mg/dL — ABNORMAL LOW (ref 87–352)
IgG (Immunoglobin G), Serum: 1069 mg/dL (ref 700–1600)

## 2015-10-21 LAB — BETA 2 MICROGLOBULIN, SERUM: BETA 2 MICROGLOBULIN: 2.6 mg/L — AB (ref 0.6–2.4)

## 2015-10-25 LAB — IMMUNOFIXATION ELECTROPHORESIS
IGM, SERUM: 47 mg/dL (ref 26–217)
IgA: 59 mg/dL — ABNORMAL LOW (ref 87–352)
IgG (Immunoglobin G), Serum: 1040 mg/dL (ref 700–1600)
Total Protein ELP: 6.9 g/dL (ref 6.0–8.5)

## 2015-11-03 ENCOUNTER — Encounter (HOSPITAL_COMMUNITY): Payer: Medicare Other

## 2015-11-03 ENCOUNTER — Encounter (HOSPITAL_COMMUNITY): Payer: Self-pay

## 2015-11-03 ENCOUNTER — Encounter (HOSPITAL_BASED_OUTPATIENT_CLINIC_OR_DEPARTMENT_OTHER): Payer: Medicare Other

## 2015-11-03 VITALS — BP 154/78 | HR 81 | Temp 98.1°F | Resp 18 | Wt 315.0 lb

## 2015-11-03 DIAGNOSIS — Z79899 Other long term (current) drug therapy: Secondary | ICD-10-CM | POA: Diagnosis not present

## 2015-11-03 DIAGNOSIS — I10 Essential (primary) hypertension: Secondary | ICD-10-CM | POA: Diagnosis not present

## 2015-11-03 DIAGNOSIS — C9000 Multiple myeloma not having achieved remission: Secondary | ICD-10-CM | POA: Diagnosis not present

## 2015-11-03 DIAGNOSIS — E669 Obesity, unspecified: Secondary | ICD-10-CM | POA: Diagnosis not present

## 2015-11-03 DIAGNOSIS — Z5112 Encounter for antineoplastic immunotherapy: Secondary | ICD-10-CM | POA: Diagnosis present

## 2015-11-03 LAB — COMPREHENSIVE METABOLIC PANEL
ALBUMIN: 4.5 g/dL (ref 3.5–5.0)
ALT: 12 U/L — ABNORMAL LOW (ref 14–54)
ANION GAP: 6 (ref 5–15)
AST: 19 U/L (ref 15–41)
Alkaline Phosphatase: 49 U/L (ref 38–126)
BUN: 21 mg/dL — ABNORMAL HIGH (ref 6–20)
CO2: 28 mmol/L (ref 22–32)
Calcium: 9.7 mg/dL (ref 8.9–10.3)
Chloride: 104 mmol/L (ref 101–111)
Creatinine, Ser: 0.88 mg/dL (ref 0.44–1.00)
GFR calc Af Amer: >60 mL/min (ref >60)
GFR calc non Af Amer: >60 mL/min (ref >60)
GLUCOSE: 88 mg/dL (ref 65–99)
POTASSIUM: 4 mmol/L (ref 3.5–5.1)
SODIUM: 138 mmol/L (ref 135–145)
Total Bilirubin: 0.6 mg/dL (ref 0.3–1.2)
Total Protein: 7.7 g/dL (ref 6.5–8.1)

## 2015-11-03 LAB — CBC WITH DIFFERENTIAL/PLATELET
BASOS ABS: 0 10*3/uL (ref 0.0–0.1)
BASOS PCT: 0 %
EOS ABS: 0.1 10*3/uL (ref 0.0–0.7)
Eosinophils Relative: 2 %
HCT: 34.9 % — ABNORMAL LOW (ref 36.0–46.0)
HEMOGLOBIN: 11.4 g/dL — AB (ref 12.0–15.0)
LYMPHS ABS: 1.3 10*3/uL (ref 0.7–4.0)
Lymphocytes Relative: 25 %
MCH: 28.6 pg (ref 26.0–34.0)
MCHC: 32.7 g/dL (ref 30.0–36.0)
MCV: 87.5 fL (ref 78.0–100.0)
MONOS PCT: 8 %
Monocytes Absolute: 0.4 10*3/uL (ref 0.1–1.0)
NEUTROS ABS: 3.4 10*3/uL (ref 1.7–7.7)
NEUTROS PCT: 65 %
Platelets: 227 10*3/uL (ref 150–400)
RBC: 3.99 MIL/uL (ref 3.87–5.11)
RDW: 13.7 % (ref 11.5–15.5)
WBC: 5.2 10*3/uL (ref 4.0–10.5)

## 2015-11-03 MED ORDER — BORTEZOMIB CHEMO SQ INJECTION 3.5 MG (2.5MG/ML)
1.3000 mg/m2 | Freq: Once | INTRAMUSCULAR | Status: AC
  Administered 2015-11-03: 3.25 mg via SUBCUTANEOUS
  Filled 2015-11-03: qty 3.25

## 2015-11-03 MED ORDER — PROCHLORPERAZINE MALEATE 10 MG PO TABS
10.0000 mg | ORAL_TABLET | Freq: Once | ORAL | Status: AC
  Administered 2015-11-03: 10 mg via ORAL
  Filled 2015-11-03: qty 1

## 2015-11-03 NOTE — Patient Instructions (Signed)
[facility] Discharge Instructions for Patients Receiving Chemotherapy   Beginning January 23rd 2017 lab work for the Cancer Center will be done in the  Main lab at [place] on 1st floor. If you have a lab appointment with the Cancer Center please come in thru the  Main Entrance and check in at the main information desk   Today you received the following chemotherapy agents:  Velcade  If you develop nausea and vomiting, or diarrhea that is not controlled by your medication, call the clinic.  The clinic phone number is [phone]. Office hours are Monday-Friday 8:30am-5:00pm.  BELOW ARE SYMPTOMS THAT SHOULD BE REPORTED IMMEDIATELY:  *FEVER GREATER THAN 101.0 F  *CHILLS WITH OR WITHOUT FEVER  NAUSEA AND VOMITING THAT IS NOT CONTROLLED WITH YOUR NAUSEA MEDICATION  *UNUSUAL SHORTNESS OF BREATH  *UNUSUAL BRUISING OR BLEEDING  TENDERNESS IN MOUTH AND THROAT WITH OR WITHOUT PRESENCE OF ULCERS  *URINARY PROBLEMS  *BOWEL PROBLEMS  UNUSUAL RASH Items with * indicate a potential emergency and should be followed up as soon as possible. If you have an emergency after office hours please contact your primary care physician or go to the nearest emergency department.  Please call the clinic during office hours if you have any questions or concerns.   You may also contact the Patient Navigator at [phone] should you have any questions or need assistance in obtaining follow up care.      Resources For Cancer Patients and their Caregivers ? American Cancer Society: Can assist with transportation, wigs, general needs, runs Look Good Feel Better.        1-888-227-6333 ? Cancer Care: Provides financial assistance, online support groups, medication/co-pay assistance.  1-800-813-HOPE (4673) ? [facility] Assists [place] cancer patients and their families through emotional , educational and financial support.   336-427-4357 ? [name] Where to apply for food stamps, Medicaid and utility assistance. 336-342-1394 ? [name]: Transportation to medical appointments. 336-347-2287 ? Social Security Administration: May apply for disability if have a Stage IV cancer. 336-342-7796 1-800-772-1213 ? [place] Aging, Disability and Transit Services: Assists with nutrition, care and transit needs. 336-349-2343         

## 2015-11-15 DIAGNOSIS — M170 Bilateral primary osteoarthritis of knee: Secondary | ICD-10-CM | POA: Diagnosis not present

## 2015-11-15 DIAGNOSIS — M25562 Pain in left knee: Secondary | ICD-10-CM | POA: Diagnosis not present

## 2015-11-15 DIAGNOSIS — M25561 Pain in right knee: Secondary | ICD-10-CM | POA: Diagnosis not present

## 2015-11-17 ENCOUNTER — Encounter (HOSPITAL_COMMUNITY): Payer: Self-pay | Admitting: Hematology & Oncology

## 2015-11-17 ENCOUNTER — Encounter (HOSPITAL_COMMUNITY): Payer: Medicare Other | Attending: Hematology & Oncology | Admitting: Hematology & Oncology

## 2015-11-17 ENCOUNTER — Encounter (HOSPITAL_COMMUNITY): Payer: Self-pay | Admitting: Lab

## 2015-11-17 ENCOUNTER — Other Ambulatory Visit (HOSPITAL_COMMUNITY): Payer: Self-pay | Admitting: Hematology & Oncology

## 2015-11-17 ENCOUNTER — Encounter (HOSPITAL_BASED_OUTPATIENT_CLINIC_OR_DEPARTMENT_OTHER): Payer: Medicare Other

## 2015-11-17 ENCOUNTER — Encounter (HOSPITAL_COMMUNITY): Payer: Medicare Other

## 2015-11-17 VITALS — BP 143/71 | HR 84 | Temp 99.4°F | Resp 20 | Wt 318.9 lb

## 2015-11-17 DIAGNOSIS — Z5112 Encounter for antineoplastic immunotherapy: Secondary | ICD-10-CM

## 2015-11-17 DIAGNOSIS — C9000 Multiple myeloma not having achieved remission: Secondary | ICD-10-CM

## 2015-11-17 DIAGNOSIS — Z87891 Personal history of nicotine dependence: Secondary | ICD-10-CM | POA: Insufficient documentation

## 2015-11-17 DIAGNOSIS — R202 Paresthesia of skin: Secondary | ICD-10-CM

## 2015-11-17 DIAGNOSIS — Z79899 Other long term (current) drug therapy: Secondary | ICD-10-CM | POA: Diagnosis not present

## 2015-11-17 DIAGNOSIS — G620 Drug-induced polyneuropathy: Secondary | ICD-10-CM | POA: Diagnosis not present

## 2015-11-17 DIAGNOSIS — R200 Anesthesia of skin: Secondary | ICD-10-CM

## 2015-11-17 DIAGNOSIS — Z9484 Stem cells transplant status: Secondary | ICD-10-CM

## 2015-11-17 LAB — COMPREHENSIVE METABOLIC PANEL
ALT: 11 U/L — ABNORMAL LOW (ref 14–54)
ANION GAP: 5 (ref 5–15)
AST: 18 U/L (ref 15–41)
Albumin: 4.3 g/dL (ref 3.5–5.0)
Alkaline Phosphatase: 52 U/L (ref 38–126)
BUN: 17 mg/dL (ref 6–20)
CHLORIDE: 104 mmol/L (ref 101–111)
CO2: 28 mmol/L (ref 22–32)
CREATININE: 0.99 mg/dL (ref 0.44–1.00)
Calcium: 10 mg/dL (ref 8.9–10.3)
Glucose, Bld: 82 mg/dL (ref 65–99)
POTASSIUM: 4.3 mmol/L (ref 3.5–5.1)
SODIUM: 137 mmol/L (ref 135–145)
Total Bilirubin: 0.5 mg/dL (ref 0.3–1.2)
Total Protein: 7.7 g/dL (ref 6.5–8.1)

## 2015-11-17 LAB — CBC WITH DIFFERENTIAL/PLATELET
Basophils Absolute: 0 10*3/uL (ref 0.0–0.1)
Basophils Relative: 0 %
EOS ABS: 0.1 10*3/uL (ref 0.0–0.7)
EOS PCT: 2 %
HCT: 35.8 % — ABNORMAL LOW (ref 36.0–46.0)
Hemoglobin: 11.8 g/dL — ABNORMAL LOW (ref 12.0–15.0)
LYMPHS ABS: 1.3 10*3/uL (ref 0.7–4.0)
LYMPHS PCT: 27 %
MCH: 28.9 pg (ref 26.0–34.0)
MCHC: 33 g/dL (ref 30.0–36.0)
MCV: 87.5 fL (ref 78.0–100.0)
MONO ABS: 0.4 10*3/uL (ref 0.1–1.0)
Monocytes Relative: 8 %
Neutro Abs: 3.1 10*3/uL (ref 1.7–7.7)
Neutrophils Relative %: 63 %
PLATELETS: 221 10*3/uL (ref 150–400)
RBC: 4.09 MIL/uL (ref 3.87–5.11)
RDW: 13.8 % (ref 11.5–15.5)
WBC: 4.8 10*3/uL (ref 4.0–10.5)

## 2015-11-17 MED ORDER — PROCHLORPERAZINE MALEATE 10 MG PO TABS
10.0000 mg | ORAL_TABLET | Freq: Once | ORAL | Status: AC
  Administered 2015-11-17: 10 mg via ORAL
  Filled 2015-11-17: qty 1

## 2015-11-17 MED ORDER — BORTEZOMIB CHEMO SQ INJECTION 3.5 MG (2.5MG/ML)
1.3000 mg/m2 | Freq: Once | INTRAMUSCULAR | Status: AC
  Administered 2015-11-17: 3.25 mg via SUBCUTANEOUS
  Filled 2015-11-17: qty 3.25

## 2015-11-17 NOTE — Patient Instructions (Signed)
[facility] Discharge Instructions for Patients Receiving Chemotherapy   Beginning January 23rd 2017 lab work for the Cancer Center will be done in the  Main lab at [place] on 1st floor. If you have a lab appointment with the Cancer Center please come in thru the  Main Entrance and check in at the main information desk   Today you received the following chemotherapy agents:  Velcade  If you develop nausea and vomiting, or diarrhea that is not controlled by your medication, call the clinic.  The clinic phone number is [phone]. Office hours are Monday-Friday 8:30am-5:00pm.  BELOW ARE SYMPTOMS THAT SHOULD BE REPORTED IMMEDIATELY:  *FEVER GREATER THAN 101.0 F  *CHILLS WITH OR WITHOUT FEVER  NAUSEA AND VOMITING THAT IS NOT CONTROLLED WITH YOUR NAUSEA MEDICATION  *UNUSUAL SHORTNESS OF BREATH  *UNUSUAL BRUISING OR BLEEDING  TENDERNESS IN MOUTH AND THROAT WITH OR WITHOUT PRESENCE OF ULCERS  *URINARY PROBLEMS  *BOWEL PROBLEMS  UNUSUAL RASH Items with * indicate a potential emergency and should be followed up as soon as possible. If you have an emergency after office hours please contact your primary care physician or go to the nearest emergency department.  Please call the clinic during office hours if you have any questions or concerns.   You may also contact the Patient Navigator at [phone] should you have any questions or need assistance in obtaining follow up care.      Resources For Cancer Patients and their Caregivers ? American Cancer Society: Can assist with transportation, wigs, general needs, runs Look Good Feel Better.        1-888-227-6333 ? Cancer Care: Provides financial assistance, online support groups, medication/co-pay assistance.  1-800-813-HOPE (4673) ? [facility] Assists [place] cancer patients and their families through emotional , educational and financial support.   336-427-4357 ? [name] Where to apply for food stamps, Medicaid and utility assistance. 336-342-1394 ? [name]: Transportation to medical appointments. 336-347-2287 ? Social Security Administration: May apply for disability if have a Stage IV cancer. 336-342-7796 1-800-772-1213 ? [place] Aging, Disability and Transit Services: Assists with nutrition, care and transit needs. [phone]         

## 2015-11-17 NOTE — Progress Notes (Signed)
Kelli Hurley presents today for injection per the provider's orders.  Velcade administration without incident; see MAR for injection details.  Patient tolerated procedure well and without incident.  No questions or complaints noted at this time.  Discharged ambulatory.

## 2015-11-17 NOTE — Progress Notes (Unsigned)
Referral to [facility] for nerve conduction test.  Records faxed on 10/11.  They will contact patient.

## 2015-11-17 NOTE — Patient Instructions (Addendum)
[place] at [facility] Discharge Instructions  RECOMMENDATIONS MADE BY THE CONSULTANT AND ANY TEST RESULTS WILL BE SENT TO YOUR REFERRING PHYSICIAN.  You saw [name] today. Follow up in 1 month with labs. Continue velcade. You will be scheduled for a nerve conduction study.  Thank you for choosing [place] at [facility] to provide your oncology and hematology care.  To afford each patient quality time with our provider, please arrive at least 15 minutes before your scheduled appointment time.   Beginning January 23rd 2017 lab work for the [name] will be done in the  Main lab at [facility] on 1st floor. If you have a lab appointment with the [facility] please come in thru the  Main Entrance and check in at the main information desk  You need to re-schedule your appointment should you arrive 10 or more minutes late.  We strive to give you quality time with our providers, and arriving late affects you and other patients whose appointments are after yours.  Also, if you no show three or more times for appointments you may be dismissed from the clinic at the providers discretion.     Again, thank you for choosing [facility].  Our hope is that these requests will decrease the amount of time that you wait before being seen by our physicians.       _____________________________________________________________  Should you have questions after your visit to [facility], please contact our office at [phone] between the hours of 8:30 a.m. and 4:30 p.m.  Voicemails left after 4:30 p.m. will not be returned until the following business day.  For prescription refill requests, have your pharmacy contact our office.         Resources For Cancer Patients and their Caregivers ? American Cancer Society: Can assist with transportation, wigs, general needs, runs Look Good Feel Better.         318-792-0631 ? Cancer Care: Provides financial assistance, online support groups, medication/co-pay assistance.  1-800-813-HOPE (574)869-0553) ? [place] Assists [place] cancer patients and their families through emotional , educational and financial support.  6087177253 ? [name] Where to apply for food stamps, Medicaid and utility assistance. 856-638-8574 ? [name]: Transportation to medical appointments. 856-793-7936 ? Social Security Administration: May apply for disability if have a Stage IV cancer. 413-688-4896 272 709 4508 ? [name], Disability and Transit Services: Assists with nutrition, care and transit needs. [place] [facility] Support Programs: @10RELATIVEDAYS @ > Cancer Support Group  2nd Tuesday of the month 1pm-2pm, Journey Room  > Creative Journey  3rd Tuesday of the month 1130am-1pm, Journey Room  > Look Good Feel Better  1st Wednesday of the month 10am-12 noon, Journey Room (Call Dustin to register [phone])

## 2015-11-17 NOTE — Progress Notes (Signed)
[facility] Hematology/Oncology Progress Note   Name: Kelli Hurley      MRN: [id number]    Date: 11/28/2015 Time:10:13 PM   REFERRING PHYSICIAN:  Dr. Charlotte Sanes   DIAGNOSIS:  Multiple Myeloma IgG kappa, STAGE II, serum albumin 3.5 g/dl  HISTORY OF PRESENT ILLNESS:   [name] is a 60 year old black American woman who presents for follow-up of Multiple Myeloma. She has undergone autologous transplantation at [facility].  Ms. Amick is unaccompanied. I personally reviewed and went over laboratory studies with the patient. She returns for ongoing Velcade maintenance.  She reports numbness in her right hand while writing. In the mornings, she has to "throw it out" because it freezes. She denies numbness in her left hand or feet. She notes that the numbness in her hand is worse at night.  She denies swelling in her legs.   She is eating well. She feels better than last year at this time. She has no other concerns or complaints at this time.  She is scheduled to see Dr. Debbrah Alar at [facility] this coming Monday, October 16th.   PAST MEDICAL HISTORY:   Past Medical History:  Diagnosis Date  . Anemia   . Claustrophobia 10/05/2014  . Hypertension   . Leukopenia 08/03/2014  . Normocytic hypochromic anemia 08/03/2014    ALLERGIES: No Known Allergies    MEDICATIONS: I have reviewed the patient's current medications.    Current Outpatient Prescriptions on File Prior to Visit  Medication Sig Dispense Refill  . acyclovir (ZOVIRAX) 400 MG tablet TAKE 1 TABLET BY MOUTH TWICE DAILY 60 tablet 2  . hydrochlorothiazide (HYDRODIURIL) 25 MG tablet Take 1 tablet (25 mg total) by mouth every morning. 30 tablet 5  . Multiple Vitamin (MULTIVITAMIN) tablet Take by mouth.    . potassium chloride SA (K-DUR,KLOR-CON) 20 MEQ tablet Take 1 tablet (20 mEq total) by mouth 2 (two) times daily. 60 tablet 1   Current Facility-Administered Medications on File Prior to Visit    Medication Dose Route Frequency Provider Last Rate Last Dose  . heparin lock flush 100 unit/mL  500 Units Intravenous Once Patrici Ranks, MD      . sodium chloride 0.9 % injection 10 mL  10 mL Intravenous Once Patrici Ranks, MD         PAST SURGICAL HISTORY Past Surgical History:  Procedure Laterality Date  . ABDOMINAL HYSTERECTOMY    . OTHER SURGICAL HISTORY     heart surgery as infant to "repair hole in heart"    FAMILY HISTORY: Family History  Problem Relation Age of Onset  . Cancer Mother   . Hypertension Mother   . Cancer Father   . Hypertension Father   . Cancer Maternal Grandmother     SOCIAL HISTORY: She reports that she used to smoke as a teenager, but quit > 40 years ago.  She denies EtOH abuse.  She denies illicit drug abuse.  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  She live byherself.  She is divorced with two daughters ages 14 and 54 who are healthy.  Her grandbaby is healthy too.  She is Psychologist, forensic.  PERFORMANCE STATUS: The patient's performance status is 1 - Symptomatic but completely ambulatory  ROS: Review of Systems  Constitutional: Negative.   HENT: Negative.   Eyes: Negative.   Respiratory: Negative.   Cardiovascular: Negative.  Negative for leg swelling.  Gastrointestinal: Negative.   Genitourinary: Negative.   Musculoskeletal: Negative.   Skin: Negative.   Neurological: Positive for tingling.       Right hand numbness  Endo/Heme/Allergies: Negative.   Psychiatric/Behavioral: Negative.   All other systems reviewed and are negative. 14 point review of systems was performed and is negative except as detailed under history of present illness and above   PHYSICAL EXAM: Most Recent Vital Signs: Blood pressure (!) 143/71, pulse 84, temperature 99.4 F (37.4 C), temperature source Oral, resp. rate 20, weight (!) 318 lb 14.4 oz (144.7 kg), SpO2 98 %. Physical Exam   Constitutional: She is oriented to person, place, and time and well-developed, well-nourished, and in no distress.  HENT:  Head: Normocephalic and atraumatic.  Mouth/Throat: Oropharynx is clear and moist.  Eyes: Conjunctivae and EOM are normal. Pupils are equal, round, and reactive to light.  Neck: Normal range of motion. Neck supple.  Cardiovascular: Normal rate, regular rhythm and normal heart sounds.   Pulmonary/Chest: Effort normal and breath sounds normal.  Abdominal: Soft. Bowel sounds are normal.  Musculoskeletal: Normal range of motion.  Neurological: She is alert and oriented to person, place, and time. Gait normal.  Skin: Skin is warm and dry.  Nursing note and vitals reviewed.  LABORATORY DATA:  I have reviewed the data as listed. Results for ELGA, SANTY (MRN [id number])   Ref. Range 11/17/2015 09:18  Sodium Latest Ref Range: 135 - 145 mmol/L 137  Potassium Latest Ref Range: 3.5 - 5.1 mmol/L 4.3  Chloride Latest Ref Range: 101 - 111 mmol/L 104  CO2 Latest Ref Range: 22 - 32 mmol/L 28  BUN Latest Ref Range: 6 - 20 mg/dL 17  Creatinine Latest Ref Range: 0.44 - 1.00 mg/dL 0.99  Calcium Latest Ref Range: 8.9 - 10.3 mg/dL 10.0  EGFR (Non-African Amer.) Latest Ref Range: >60 mL/min >60  EGFR (African American) Latest Ref Range: >60 mL/min >60  Glucose Latest Ref Range: 65 - 99 mg/dL 82  Anion gap Latest Ref Range: 5 - 15  5  Alkaline Phosphatase Latest Ref Range: 38 - 126 U/L 52  Albumin Latest Ref Range: 3.5 - 5.0 g/dL 4.3  AST Latest Ref Range: 15 - 41 U/L 18  ALT Latest Ref Range: 14 - 54 U/L 11 (L)  Total Protein Latest Ref Range: 6.5 - 8.1 g/dL 7.7  Total Bilirubin Latest Ref Range: 0.3 - 1.2 mg/dL 0.5  WBC Latest Ref Range: 4.0 - 10.5 K/uL 4.8  RBC Latest Ref Range: 3.87 - 5.11 MIL/uL 4.09  Hemoglobin Latest Ref Range: 12.0 - 15.0 g/dL 11.8 (L)  HCT Latest Ref Range: 36.0 - 46.0 % 35.8 (L)  MCV Latest Ref Range: 78.0 - 100.0 fL 87.5  MCH Latest Ref Range:  26.0 - 34.0 pg 28.9  MCHC Latest Ref Range: 30.0 - 36.0 g/dL 33.0  RDW Latest Ref Range: 11.5 - 15.5 % 13.8  Platelets Latest Ref Range: 150 - 400 K/uL 221  Neutrophils Latest Units: % 63  Lymphocytes Latest Units: % 27  Monocytes Relative Latest Units: % 8  Eosinophil Latest Units: % 2  Basophil Latest Units: % 0  NEUT# Latest Ref Range: 1.7 - 7.7 K/uL 3.1  Lymphocyte # Latest Ref Range: 0.7 - 4.0 K/uL 1.3  Monocyte # Latest Ref Range: 0.1 - 1.0 K/uL 0.4  Eosinophils Absolute Latest Ref Range: 0.0 - 0.7 K/uL 0.1  Basophils Absolute Latest Ref Range: 0.0 - 0.1 K/uL 0.0  Bone Marrow BiopsyS Diagnosis Bone Marrow, Aspirate,Biopsy, and Clot - NORMOCELLULAR BONE MARROW FOR AGE WITH PLASMA CELL NEOPLASM. - TRILINEAGE HEMATOPOIESIS. - SEE COMMENT. PERIPHERAL BLOOD: - NORMOCYTIC-NORMOCHROMIC ANEMIA - LEUKOPENIA Diagnosis Note Despite limited aspirate material, the plasma cell component is increased in the marrow representing an estimated 18% of all cells with associated atypical cytomorphologic features. Immunohistochemical stains show the the plasma cells are kappa light chain-restricted consistent with plasma cell neoplasm. The background shows trilineage hematopoiesis with non specific changes. Correlation with cytogenetic and FISH studies is recommended. (BNS:ds/gt, 09/04/14) Susanne Greenhouse MD Pathologist, Electronic Signature (Case signed 09/07/2014)   RADIOGRAPHIC STUDIES: I have reviewed the images below and agree with the results:  Result Impression   No FDG avid osseous or soft tissue lesions are identified. Ancillary CT findings as above.   Result Narrative  PET MULTIPLE MYELOMA (W/LOW DOSE CT), 04/07/2015 10:46 AM  INDICATION: Evaluation for stem cell transplant COMPARISON: Outside facility PET/CT 09/30/2014  TECHNIQUE: 79 minutes after the intravenous injection of 13.218 mCi F-18 FDG, images were obtained from the vertex through the toes. These images were  attenuation corrected using CT. Standardized uptake values (SUV) were calculated using a lean body mass algorithm. Blood glucose at the time of injection was 81 mg/dL.  [facility] and its affiliates are committed to minimizing radiation dose to patients while maintaining necessary diagnostic image quality. All CT scans are therefore performed using "As Low As Reasonably Achievable (ALARA)" protocols with either manual or automated exposure controls calibrated to the age and size of each patient.  LIMITATIONS: The low-dose CT acquisition was performed only for attenuation correction/activity localization. There is no intravenous contrast, further limiting the CT component of the study. Patient body habitus further limits diagnostic quality of the CT portion of the exam. This modality has limited utility for detection or characterization of small lung nodules. Evaluation of the vasculature is limited by lack of IV contrast. Evaluation of the kidney, ureters, and bladder are limited by urinary excretion of radiotracer. Physiologic bowel uptake of FDG and lack of CT contrast limit evaluation of the bowel.   FINDINGS:   HEAD and NECK:     No abnormal FDG uptake is seen in the face, orbits, sinuses, oral cavity, or thyroid. Ancillary head and neck CT findings: None.              CHEST:     No abnormal FDG uptake is seen in the heart, lungs, pleura, esophagus, hila/mediastinum, axilla, or breasts. Ancillary chest CT findings: None.              ABDOMEN/PELVIS:     No abnormal FDG uptake is seen in the liver, spleen, gallbladder, pancreas, adrenals, peritoneum, extraperitoneum, nodes, or reproductive tract. Ancillary abdomen and pelvis CT findings: None.              MUSCULOSKELETAL:     No abnormal FDG uptake is seen in the soft tissues or bones. Ancillary musculoskeletal CT findings: Bilateral os acromiale. Degenerative changes in the lower back, SI joints, hips, and knees.  Calcific tendinitis of the right Achilles tendon.         Expected physiologic activity within the kidneys, ureter, bladder, oropharynx, salivary glands, stomach, bowel and brain.    PATHOLOGY:  ACCESSION NUMBER: [id number] RECEIVED: 04/07/2015 ORDERING PHYSICIAN: CESAR [name] , MD PATIENT NAME: Kelli Hurley BONE MARROW REPORT   Bone Marrow (BM) and Peripheral Blood (PB) FINAL PATHOLOGIC DIAGNOSIS  BONE MARROW: Normocellular marrow (40%)  with approximately 2-4% plasma cells. See comment.  PERIPHERAL BLOOD: Mild normocytic anemia and leucopenia. No rouleaux formation. See CBC data.  COMMENT: The bone marrow aspirate smears are paucispicular, paucicellular and adequate for evaluation. There is no increase in plasma cells (1% by manual differential counts). Blasts are not increased in numbers (less than 1% by manual differential counts). The myeloid and erythroid elements are present in normal proportions with an M: E ratio of 1.8:1. There are no overt dysplastic features of the myeloid or erythroid lineages. Megakaryocytes are quantitatively and qualitatively unremarkable.  Examination of the bone marrow core biopsy is hampered by aspiration artifact, but reveals an overall normocellular marrow for the patient's age (40%). Myeloid and erythroid elements are present in normal proportions. Megakaryocytes are morphologically unremarkable and are present without clustering. There are no atypical lymphoid aggregates or granulomatous formations identified. CD138 immunohistochemistry reveals approximately 2-4% plasma cells. In situ hybridization could not be performed on the core biopsy. No plasma cell clusters are seen. Examination of the clot sections reveals mostly peripheral blood with a single bone marrow spicule.  The peripheral blood smears reveal no rouleaux formation. The peripheral blood leukocytes are morphologically unremarkable.  The positive  immunohistochemical controls were performed and worked appropriately.  CYTOGENETICS Date Ordered: 04/07/2015 Date Reported: 04/13/2015 Interpretation  Laboratory Analysis:  GTG-banded Metaphases: # Cells Karyotyped: Band Resolution:   Molecular Cytogenetic Analysis: (chromosomes pending)   .nuc ish (FGFR3,IgH)x2,(CCND1,IgH)x2,(CEP12,D13S319,LAMP1)x2,(ATM,p53)x2   Molecular Cytogenetic Analysis - FISH: Normal: The investigative technique of molecular-cytogenetic analysis was performed per physician's request using a DNA probe panel specific for regions associated with chromosomal abnormalities of Multiple Myeloma involving loci of chromosomes 4, 11, 12, 13, 14, and 17 on interphase nuclei.  0% of cells showed no fusion, gain/loss of IGH/FGFR3  This result is below the cut-off value of 2% and is technically negative.    ASSESSMENT/PLAN:  Multiple Myeloma IgG kappa, STAGE II, serum albumin 3.5 g/dl IgG of 04/14/2007 MG/DL, total M spike of 2.7 g/DL Beta-2 microglobulin of 3.7 MG/L Severe Anemia at presentation BMBX with 18% monoclonal plasma cells, kappa light chain restsricted. 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 Overall doing well. Continue with no dose changes.   Last seen by Dr. Tiana Loft on 08/23/2015. She will see them again on October 16th. She will be receiving her vaccinations, including the flu vaccine. The patient was given a copy of her labs to take with her to this visit.  I have referred the patient for a nerve conduction study to investigate right  hand numbness. I do not feel this is secondary to her velcade. Will evaluate for carpel tunnel.  She will return for follow up in 1 month.    Orders Placed This Encounter  Procedures  . CBC with Differential    Standing Status:   Future    Standing Expiration Date:   11/16/2016  . Comprehensive metabolic panel    Standing Status:   Future    Standing Expiration Date:   11/16/2016  . Protein electrophoresis, serum    Standing Status:   Future    Standing Expiration Date:   11/16/2016  . Immunofixation  electrophoresis    Standing Status:   Future    Standing Expiration Date:   11/16/2016  . Kappa/lambda light chains    Standing Status:   Future    Standing Expiration Date:   11/16/2016  . Nerve conduction test    R hand numbness/tingling, worse at night    Standing Status:   Future    Standing Expiration Date:   11/16/2016    Order Specific Question:   Where should this test be performed?    Answer:   [facility]    All questions were answered. The patient knows to call the clinic with any problems, questions or concerns. We can certainly see the patient much sooner if necessary.   This document serves as a record of services personally performed by Ancil Linsey, MD. It was created on her behalf by Arlyce Harman, a trained medical scribe. The creation of this record is based on the scribe's personal observations and the provider's statements to them. This document has been checked and approved by the attending provider.  I have reviewed the above documentation for accuracy and completeness, and I agree with the above.  This note is electronically signed.  Kelby Fam. Whitney Muse, MD

## 2015-11-24 DIAGNOSIS — M25562 Pain in left knee: Secondary | ICD-10-CM | POA: Diagnosis not present

## 2015-11-24 DIAGNOSIS — M1712 Unilateral primary osteoarthritis, left knee: Secondary | ICD-10-CM | POA: Diagnosis not present

## 2015-11-28 ENCOUNTER — Encounter (HOSPITAL_COMMUNITY): Payer: Self-pay | Admitting: Hematology & Oncology

## 2015-11-29 DIAGNOSIS — M1711 Unilateral primary osteoarthritis, right knee: Secondary | ICD-10-CM | POA: Diagnosis not present

## 2015-11-29 DIAGNOSIS — M25561 Pain in right knee: Secondary | ICD-10-CM | POA: Diagnosis not present

## 2015-12-01 ENCOUNTER — Other Ambulatory Visit (HOSPITAL_COMMUNITY): Payer: Medicare Other

## 2015-12-01 ENCOUNTER — Inpatient Hospital Stay (HOSPITAL_COMMUNITY): Payer: Medicare Other

## 2015-12-01 DIAGNOSIS — I10 Essential (primary) hypertension: Secondary | ICD-10-CM | POA: Diagnosis not present

## 2015-12-01 DIAGNOSIS — Z9484 Stem cells transplant status: Secondary | ICD-10-CM | POA: Diagnosis not present

## 2015-12-01 DIAGNOSIS — J45909 Unspecified asthma, uncomplicated: Secondary | ICD-10-CM | POA: Diagnosis not present

## 2015-12-01 DIAGNOSIS — Z23 Encounter for immunization: Secondary | ICD-10-CM | POA: Diagnosis not present

## 2015-12-01 DIAGNOSIS — C9000 Multiple myeloma not having achieved remission: Secondary | ICD-10-CM | POA: Diagnosis not present

## 2015-12-01 DIAGNOSIS — Z8579 Personal history of other malignant neoplasms of lymphoid, hematopoietic and related tissues: Secondary | ICD-10-CM | POA: Diagnosis not present

## 2015-12-01 DIAGNOSIS — Z79899 Other long term (current) drug therapy: Secondary | ICD-10-CM | POA: Diagnosis not present

## 2015-12-01 DIAGNOSIS — Z08 Encounter for follow-up examination after completed treatment for malignant neoplasm: Secondary | ICD-10-CM | POA: Diagnosis not present

## 2015-12-02 ENCOUNTER — Encounter (HOSPITAL_COMMUNITY): Payer: Self-pay

## 2015-12-02 ENCOUNTER — Encounter (HOSPITAL_BASED_OUTPATIENT_CLINIC_OR_DEPARTMENT_OTHER): Payer: Medicare Other

## 2015-12-02 ENCOUNTER — Encounter (HOSPITAL_COMMUNITY): Payer: Medicare Other

## 2015-12-02 VITALS — BP 174/80 | HR 81 | Temp 98.2°F | Resp 18

## 2015-12-02 DIAGNOSIS — C9000 Multiple myeloma not having achieved remission: Secondary | ICD-10-CM

## 2015-12-02 DIAGNOSIS — Z79899 Other long term (current) drug therapy: Secondary | ICD-10-CM | POA: Diagnosis not present

## 2015-12-02 DIAGNOSIS — Z87891 Personal history of nicotine dependence: Secondary | ICD-10-CM | POA: Diagnosis not present

## 2015-12-02 DIAGNOSIS — Z5112 Encounter for antineoplastic immunotherapy: Secondary | ICD-10-CM | POA: Diagnosis not present

## 2015-12-02 LAB — COMPREHENSIVE METABOLIC PANEL
ALBUMIN: 4.3 g/dL (ref 3.5–5.0)
ALK PHOS: 45 U/L (ref 38–126)
ALT: 13 U/L — ABNORMAL LOW (ref 14–54)
ANION GAP: 6 (ref 5–15)
AST: 19 U/L (ref 15–41)
BUN: 18 mg/dL (ref 6–20)
CALCIUM: 9.7 mg/dL (ref 8.9–10.3)
CO2: 28 mmol/L (ref 22–32)
Chloride: 103 mmol/L (ref 101–111)
Creatinine, Ser: 0.98 mg/dL (ref 0.44–1.00)
GFR calc Af Amer: >60 mL/min (ref >60)
GFR calc non Af Amer: >60 mL/min (ref >60)
GLUCOSE: 97 mg/dL (ref 65–99)
POTASSIUM: 4.3 mmol/L (ref 3.5–5.1)
SODIUM: 137 mmol/L (ref 135–145)
Total Bilirubin: 0.6 mg/dL (ref 0.3–1.2)
Total Protein: 7.6 g/dL (ref 6.5–8.1)

## 2015-12-02 LAB — CBC WITH DIFFERENTIAL/PLATELET
BASOS ABS: 0 10*3/uL (ref 0.0–0.1)
BASOS PCT: 0 %
EOS ABS: 0.1 10*3/uL (ref 0.0–0.7)
Eosinophils Relative: 1 %
HCT: 35 % — ABNORMAL LOW (ref 36.0–46.0)
HEMOGLOBIN: 11.4 g/dL — AB (ref 12.0–15.0)
Lymphocytes Relative: 13 %
Lymphs Abs: 0.7 10*3/uL (ref 0.7–4.0)
MCH: 29 pg (ref 26.0–34.0)
MCHC: 32.6 g/dL (ref 30.0–36.0)
MCV: 89.1 fL (ref 78.0–100.0)
MONOS PCT: 11 %
Monocytes Absolute: 0.5 10*3/uL (ref 0.1–1.0)
NEUTROS PCT: 75 %
Neutro Abs: 3.7 10*3/uL (ref 1.7–7.7)
Platelets: 210 10*3/uL (ref 150–400)
RBC: 3.93 MIL/uL (ref 3.87–5.11)
RDW: 13.8 % (ref 11.5–15.5)
WBC: 4.9 10*3/uL (ref 4.0–10.5)

## 2015-12-02 MED ORDER — PROCHLORPERAZINE MALEATE 10 MG PO TABS
ORAL_TABLET | ORAL | Status: AC
  Filled 2015-12-02: qty 1

## 2015-12-02 MED ORDER — PROCHLORPERAZINE MALEATE 10 MG PO TABS
10.0000 mg | ORAL_TABLET | Freq: Once | ORAL | Status: AC
  Administered 2015-12-02: 10 mg via ORAL

## 2015-12-02 MED ORDER — BORTEZOMIB CHEMO SQ INJECTION 3.5 MG (2.5MG/ML)
1.3000 mg/m2 | Freq: Once | INTRAMUSCULAR | Status: AC
  Administered 2015-12-02: 3.25 mg via SUBCUTANEOUS
  Filled 2015-12-02: qty 3.25

## 2015-12-02 NOTE — Progress Notes (Signed)
Velcade injection done today per orders. Patient stated that she has not had any problems with the injection. Vitals stable and discharged from clinic ambulatory.

## 2015-12-02 NOTE — Progress Notes (Signed)
Kelli Hurley presents today for injection per MD orders. Velcade 3.25mg  administered SQ in right Abdomen. Administration without incident. Patient tolerated well.

## 2015-12-02 NOTE — Patient Instructions (Signed)
[facility] Discharge Instructions for Patients Receiving Chemotherapy   Beginning January 23rd 2017 lab work for the Cancer Center will be done in the  Main lab at [place] on 1st floor. If you have a lab appointment with the Cancer Center please come in thru the  Main Entrance and check in at the main information desk   Today you received the following chemotherapy agents Velcade  To help prevent nausea and vomiting after your treatment, we encourage you to take your nausea medication    If you develop nausea and vomiting, or diarrhea that is not controlled by your medication, call the clinic.  The clinic phone number is [phone]. Office hours are Monday-Friday 8:30am-5:00pm.  BELOW ARE SYMPTOMS THAT SHOULD BE REPORTED IMMEDIATELY:  *FEVER GREATER THAN 101.0 F  *CHILLS WITH OR WITHOUT FEVER  NAUSEA AND VOMITING THAT IS NOT CONTROLLED WITH YOUR NAUSEA MEDICATION  *UNUSUAL SHORTNESS OF BREATH  *UNUSUAL BRUISING OR BLEEDING  TENDERNESS IN MOUTH AND THROAT WITH OR WITHOUT PRESENCE OF ULCERS  *URINARY PROBLEMS  *BOWEL PROBLEMS  UNUSUAL RASH Items with * indicate a potential emergency and should be followed up as soon as possible. If you have an emergency after office hours please contact your primary care physician or go to the nearest emergency department.  Please call the clinic during office hours if you have any questions or concerns.   You may also contact the Patient Navigator at [phone] should you have any questions or need assistance in obtaining follow up care.      Resources For Cancer Patients and their Caregivers ? American Cancer Society: Can assist with transportation, wigs, general needs, runs Look Good Feel Better.        1-888-227-6333 ? Cancer Care: Provides financial assistance, online support groups, medication/co-pay assistance.  1-800-813-HOPE (4673) ? [facility] Assists [place] cancer  patients and their families through emotional , educational and financial support.  336-427-4357 ? [name] Where to apply for food stamps, Medicaid and utility assistance. 336-342-1394 ? [name]: Transportation to medical appointments. 336-347-2287 ? Social Security Administration: May apply for disability if have a Stage IV cancer. 336-342-7796 1-800-772-1213 ? [place] Aging, Disability and Transit Services: Assists with nutrition, care and transit needs. 336-349-2343          

## 2015-12-02 NOTE — Addendum Note (Signed)
Addended by: Joie Bimler on: 12/02/2015 02:14 PM   Modules accepted: Orders

## 2015-12-02 NOTE — Addendum Note (Signed)
Addended by: Donnie Aho on: 12/02/2015 01:50 PM   Modules accepted: Orders

## 2015-12-06 DIAGNOSIS — M25561 Pain in right knee: Secondary | ICD-10-CM | POA: Diagnosis not present

## 2015-12-06 DIAGNOSIS — M170 Bilateral primary osteoarthritis of knee: Secondary | ICD-10-CM | POA: Diagnosis not present

## 2015-12-06 DIAGNOSIS — M25562 Pain in left knee: Secondary | ICD-10-CM | POA: Diagnosis not present

## 2015-12-08 ENCOUNTER — Other Ambulatory Visit (HOSPITAL_COMMUNITY): Payer: Self-pay | Admitting: Oncology

## 2015-12-08 DIAGNOSIS — E876 Hypokalemia: Secondary | ICD-10-CM

## 2015-12-15 ENCOUNTER — Other Ambulatory Visit (HOSPITAL_COMMUNITY): Payer: Self-pay | Admitting: Pharmacist

## 2015-12-15 ENCOUNTER — Encounter (HOSPITAL_COMMUNITY): Payer: Medicare Other | Attending: Hematology & Oncology | Admitting: Hematology & Oncology

## 2015-12-15 ENCOUNTER — Encounter (HOSPITAL_COMMUNITY): Payer: Self-pay | Admitting: Hematology & Oncology

## 2015-12-15 ENCOUNTER — Encounter (HOSPITAL_COMMUNITY): Payer: Medicare Other | Attending: Hematology & Oncology

## 2015-12-15 VITALS — BP 118/80 | HR 74 | Temp 97.9°F | Resp 18

## 2015-12-15 VITALS — BP 135/82 | HR 77 | Temp 98.2°F | Resp 18 | Wt 319.3 lb

## 2015-12-15 DIAGNOSIS — C9000 Multiple myeloma not having achieved remission: Secondary | ICD-10-CM

## 2015-12-15 DIAGNOSIS — C9001 Multiple myeloma in remission: Secondary | ICD-10-CM

## 2015-12-15 DIAGNOSIS — R202 Paresthesia of skin: Secondary | ICD-10-CM

## 2015-12-15 DIAGNOSIS — Z5112 Encounter for antineoplastic immunotherapy: Secondary | ICD-10-CM

## 2015-12-15 DIAGNOSIS — Z79899 Other long term (current) drug therapy: Secondary | ICD-10-CM

## 2015-12-15 DIAGNOSIS — R200 Anesthesia of skin: Secondary | ICD-10-CM

## 2015-12-15 DIAGNOSIS — Z9484 Stem cells transplant status: Secondary | ICD-10-CM

## 2015-12-15 LAB — COMPREHENSIVE METABOLIC PANEL
ALBUMIN: 4.3 g/dL (ref 3.5–5.0)
ALK PHOS: 48 U/L (ref 38–126)
ALT: 13 U/L — AB (ref 14–54)
AST: 18 U/L (ref 15–41)
Anion gap: 6 (ref 5–15)
BILIRUBIN TOTAL: 0.6 mg/dL (ref 0.3–1.2)
BUN: 15 mg/dL (ref 6–20)
CALCIUM: 9.5 mg/dL (ref 8.9–10.3)
CO2: 28 mmol/L (ref 22–32)
CREATININE: 1.21 mg/dL — AB (ref 0.44–1.00)
Chloride: 105 mmol/L (ref 101–111)
GFR calc Af Amer: 56 mL/min — ABNORMAL LOW (ref >60)
GFR calc non Af Amer: 48 mL/min — ABNORMAL LOW (ref >60)
GLUCOSE: 87 mg/dL (ref 65–99)
Potassium: 3.9 mmol/L (ref 3.5–5.1)
SODIUM: 139 mmol/L (ref 135–145)
TOTAL PROTEIN: 8 g/dL (ref 6.5–8.1)

## 2015-12-15 LAB — CBC WITH DIFFERENTIAL/PLATELET
BASOS PCT: 0 %
Basophils Absolute: 0 10*3/uL (ref 0.0–0.1)
EOS ABS: 0.1 10*3/uL (ref 0.0–0.7)
Eosinophils Relative: 3 %
HEMATOCRIT: 35.9 % — AB (ref 36.0–46.0)
HEMOGLOBIN: 11.8 g/dL — AB (ref 12.0–15.0)
LYMPHS ABS: 1.1 10*3/uL (ref 0.7–4.0)
Lymphocytes Relative: 20 %
MCH: 29.2 pg (ref 26.0–34.0)
MCHC: 32.9 g/dL (ref 30.0–36.0)
MCV: 88.9 fL (ref 78.0–100.0)
MONOS PCT: 8 %
Monocytes Absolute: 0.4 10*3/uL (ref 0.1–1.0)
NEUTROS ABS: 3.8 10*3/uL (ref 1.7–7.7)
NEUTROS PCT: 69 %
Platelets: 240 10*3/uL (ref 150–400)
RBC: 4.04 MIL/uL (ref 3.87–5.11)
RDW: 14.2 % (ref 11.5–15.5)
WBC: 5.5 10*3/uL (ref 4.0–10.5)

## 2015-12-15 MED ORDER — ZOLEDRONIC ACID 4 MG/5ML IV CONC
4.0000 mg | Freq: Once | INTRAVENOUS | Status: DC
  Filled 2015-12-15: qty 5

## 2015-12-15 MED ORDER — ONE-DAILY MULTI VITAMINS PO TABS: 1.0000 | ORAL_TABLET | Freq: Every day | ORAL | 1 refills | Status: DC

## 2015-12-15 MED ORDER — BORTEZOMIB CHEMO SQ INJECTION 3.5 MG (2.5MG/ML)
1.3000 mg/m2 | Freq: Once | INTRAMUSCULAR | Status: AC
  Administered 2015-12-15: 3.25 mg via SUBCUTANEOUS
  Filled 2015-12-15: qty 3.25

## 2015-12-15 MED ORDER — PROCHLORPERAZINE MALEATE 10 MG PO TABS
ORAL_TABLET | ORAL | Status: AC
  Filled 2015-12-15: qty 1

## 2015-12-15 MED ORDER — SODIUM CHLORIDE 0.9 % IV SOLN
INTRAVENOUS | Status: DC
  Administered 2015-12-15: 12:00:00 via INTRAVENOUS

## 2015-12-15 MED ORDER — ACYCLOVIR 800 MG PO TABS: 800.0000 mg | ORAL_TABLET | Freq: Two times a day (BID) | ORAL | 1 refills | Status: DC

## 2015-12-15 MED ORDER — ZOLEDRONIC ACID 4 MG/100ML IV SOLN
4.0000 mg | Freq: Once | INTRAVENOUS | Status: AC
  Administered 2015-12-15: 4 mg via INTRAVENOUS
  Filled 2015-12-15: qty 100

## 2015-12-15 MED ORDER — PROCHLORPERAZINE MALEATE 10 MG PO TABS
10.0000 mg | ORAL_TABLET | Freq: Once | ORAL | Status: AC
  Administered 2015-12-15: 10 mg via ORAL

## 2015-12-15 NOTE — Progress Notes (Signed)
[facility] Hematology/Oncology Progress Note   Name: Kelli Hurley      MRN: [id number]    Date: 12/15/2015 Time:9:59 AM   REFERRING PHYSICIAN:  Dr. Charlotte Hurley   DIAGNOSIS:  Multiple Myeloma IgG kappa, STAGE II, serum albumin 3.5 g/dl  HISTORY OF PRESENT ILLNESS:   Kelli Hurley is a 60 year old black American woman who presents for follow-up of Multiple Myeloma. She has undergone autologous transplantation at [facility].  Kelli Hurley is unaccompanied. She is day 208 post-transplant.   I have personally reviewed the labs with the patient.   She has received her HepB, PCV13, DTaP, HIB, IPV, and flu shot on 12/01/15.   Her mammogram is scheduled.   She is scheduling a dentist appointment once she speaks to the financial department today about her insurance. She had an appointment scheduled yesterday but something came up with Medicaid.   She is up to date on her colonoscopy.   She is going to [facility] for cancer survivors.   Her appetite is "real good". She denies abdominal pain.   She denies any leg swelling or tingling in her hands and feet.  Her breathing is okay.  She has no major complaints today and notes that she feels the best she has "in a long while." She attends church regularly.    PAST MEDICAL HISTORY:   Past Medical History:  Diagnosis Date  . Anemia   . Claustrophobia 10/05/2014  . Hypertension   . Leukopenia 08/03/2014  . Normocytic hypochromic anemia 08/03/2014    ALLERGIES: No Known Allergies    MEDICATIONS: I have reviewed the patient's current medications.    Current Outpatient Prescriptions on File Prior to Visit  Medication Sig Dispense Refill  . hydrochlorothiazide (HYDRODIURIL) 25 MG tablet Take 1 tablet (25 mg total) by mouth every morning. 30 tablet 5  . potassium chloride SA (K-DUR,KLOR-CON) 20 MEQ tablet TAKE 1 TABLET BY MOUTH TWICE DAILY 60 tablet 0   Current Facility-Administered Medications on  File Prior to Visit  Medication Dose Route Frequency Provider Last Rate Last Dose  . heparin lock flush 100 unit/mL  500 Units Intravenous Once Kelli Ranks, MD      . sodium chloride 0.9 % injection 10 mL  10 mL Intravenous Once Kelli Ranks, MD         PAST SURGICAL HISTORY Past Surgical History:  Procedure Laterality Date  . ABDOMINAL HYSTERECTOMY    . OTHER SURGICAL HISTORY     heart surgery as infant to "repair hole in heart"    FAMILY HISTORY: Family History  Problem Relation Age of Onset  . Cancer Mother   . Hypertension Mother   . Cancer Father   . Hypertension Father   . Cancer Maternal Grandmother     SOCIAL HISTORY: She reports that she used to smoke as a teenager, but quit > 40 years ago.  She denies EtOH abuse.  She denies illicit drug abuse.  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  She live byherself.  She is divorced with two daughters ages 88 and 60 who are healthy.  Her grandbaby is healthy too.  She is Psychologist, forensic.  PERFORMANCE STATUS: The patient's performance status is 1 - Symptomatic but completely ambulatory  ROS: Review of Systems  Constitutional: Negative.        Her appetite is  good.   HENT: Negative.   Eyes: Negative.   Respiratory: Negative.  Negative for shortness of breath.   Cardiovascular: Negative.  Negative for leg swelling.  Gastrointestinal: Negative.  Negative for abdominal pain.  Genitourinary: Negative.   Musculoskeletal: Negative.   Skin: Negative.   Neurological: Negative for tingling.  Endo/Heme/Allergies: Negative.   Psychiatric/Behavioral: Negative.   All other systems reviewed and are negative. 14 point review of systems was performed and is negative except as detailed under history of present illness and above   PHYSICAL EXAM: Most Recent Vital Signs:  Blood pressure 135/82, pulse 77, temperature 98.2 F (36.8 C), temperature source Oral, resp.  rate 18, weight (!) 319 lb 4.8 oz (144.8 kg), SpO2 99 %.    Physical Exam  Constitutional: She is oriented to person, place, and time and well-developed, well-nourished, and in no distress.  Patient was able to get on exam table with assistance.   HENT:  Head: Normocephalic and atraumatic.  Mouth/Throat: Oropharynx is clear and moist.  Eyes: Conjunctivae and EOM are normal. Pupils are equal, round, and reactive to light.  Neck: Normal range of motion. Neck supple.  Cardiovascular: Normal rate, regular rhythm and normal heart sounds.   Pulmonary/Chest: Effort normal and breath sounds normal.  Abdominal: Soft. Bowel sounds are normal.  Musculoskeletal: Normal range of motion.  Neurological: She is alert and oriented to person, place, and time. Gait normal.  Skin: Skin is warm and dry.  Nursing note and vitals reviewed.  LABORATORY DATA:  I have reviewed the data as listed.  Results for Kelli, Hurley (MRN [id number])   Ref. Range 12/15/2015 09:13  Sodium Latest Ref Range: 135 - 145 mmol/L 139  Potassium Latest Ref Range: 3.5 - 5.1 mmol/L 3.9  Chloride Latest Ref Range: 101 - 111 mmol/L 105  CO2 Latest Ref Range: 22 - 32 mmol/L 28  BUN Latest Ref Range: 6 - 20 mg/dL 15  Creatinine Latest Ref Range: 0.44 - 1.00 mg/dL 1.21 (H)  Calcium Latest Ref Range: 8.9 - 10.3 mg/dL 9.5  EGFR (Non-African Amer.) Latest Ref Range: >60 mL/min 48 (L)  EGFR (African American) Latest Ref Range: >60 mL/min 56 (L)  Glucose Latest Ref Range: 65 - 99 mg/dL 87  Anion gap Latest Ref Range: 5 - 15  6  Alkaline Phosphatase Latest Ref Range: 38 - 126 U/L 48  Albumin Latest Ref Range: 3.5 - 5.0 g/dL 4.3  AST Latest Ref Range: 15 - 41 U/L 18  ALT Latest Ref Range: 14 - 54 U/L 13 (L)  Total Protein Latest Ref Range: 6.5 - 8.1 g/dL 8.0  Total Bilirubin Latest Ref Range: 0.3 - 1.2 mg/dL 0.6  Kappa free light chain Latest Ref Range: 3.3 - 19.4 mg/L 21.9 (H)  Lamda free light chains Latest Ref Range: 5.7 -  26.3 mg/L 12.5  Kappa, lamda light chain ratio Latest Ref Range: 0.26 - 1.65  1.75 (H)  WBC Latest Ref Range: 4.0 - 10.5 K/uL 5.5  RBC Latest Ref Range: 3.87 - 5.11 MIL/uL 4.04  Hemoglobin Latest Ref Range: 12.0 - 15.0 g/dL 11.8 (L)  HCT Latest Ref Range: 36.0 - 46.0 % 35.9 (L)  MCV Latest Ref Range: 78.0 - 100.0 fL 88.9  MCH Latest Ref Range: 26.0 - 34.0 pg 29.2  MCHC Latest Ref Range: 30.0 - 36.0 g/dL 32.9  RDW Latest Ref Range: 11.5 - 15.5 % 14.2  Platelets Latest Ref Range: 150 - 400 K/uL 240  Neutrophils Latest Units: % 69  Lymphocytes Latest Units: % 20  Monocytes Relative Latest Units: % 8  Eosinophil Latest Units: % 3  Basophil Latest Units: % 0  NEUT# Latest Ref Range: 1.7 - 7.7 K/uL 3.8  Lymphocyte # Latest Ref Range: 0.7 - 4.0 K/uL 1.1  Monocyte # Latest Ref Range: 0.1 - 1.0 K/uL 0.4  Eosinophils Absolute Latest Ref Range: 0.0 - 0.7 K/uL 0.1  Basophils Absolute Latest Ref Range: 0.0 - 0.1 K/uL 0.0        Bone Marrow BiopsyS Diagnosis Bone Marrow, Aspirate,Biopsy, and Clot - NORMOCELLULAR BONE MARROW FOR AGE WITH PLASMA CELL NEOPLASM. - TRILINEAGE HEMATOPOIESIS. - SEE COMMENT. PERIPHERAL BLOOD: - NORMOCYTIC-NORMOCHROMIC ANEMIA - LEUKOPENIA Diagnosis Note Despite limited aspirate material, the plasma cell component is increased in the marrow representing an estimated 18% of all cells with associated atypical cytomorphologic features. Immunohistochemical stains show the the plasma cells are kappa light chain-restricted consistent with plasma cell neoplasm. The background shows trilineage hematopoiesis with non specific changes. Correlation with cytogenetic and FISH studies is recommended. (BNS:ds/gt, 09/04/14) Susanne Greenhouse MD Pathologist, Electronic Signature (Case signed 09/07/2014)   RADIOGRAPHIC STUDIES: I have reviewed the images below and agree with the results:  Result Impression   No FDG avid osseous or soft tissue lesions are identified. Ancillary CT  findings as above.   Result Narrative  PET MULTIPLE MYELOMA (W/LOW DOSE CT), 04/07/2015 10:46 AM  INDICATION: Evaluation for stem cell transplant COMPARISON: Outside facility PET/CT 09/30/2014  TECHNIQUE: 79 minutes after the intravenous injection of 13.218 mCi F-18 FDG, images were obtained from the vertex through the toes. These images were attenuation corrected using CT. Standardized uptake values (SUV) were calculated using a lean body mass algorithm. Blood glucose at the time of injection was 81 mg/dL.  [facility] and its affiliates are committed to minimizing radiation dose to patients while maintaining necessary diagnostic image quality. All CT scans are therefore performed using "As Low As Reasonably Achievable (ALARA)" protocols with either manual or automated exposure controls calibrated to the age and size of each patient.  LIMITATIONS: The low-dose CT acquisition was performed only for attenuation correction/activity localization. There is no intravenous contrast, further limiting the CT component of the study. Patient body habitus further limits diagnostic quality of the CT portion of the exam. This modality has limited utility for detection or characterization of small lung nodules. Evaluation of the vasculature is limited by lack of IV contrast. Evaluation of the kidney, ureters, and bladder are limited by urinary excretion of radiotracer. Physiologic bowel uptake of FDG and lack of CT contrast limit evaluation of the bowel.   FINDINGS:   HEAD and NECK:     No abnormal FDG uptake is seen in the face, orbits, sinuses, oral cavity, or thyroid. Ancillary head and neck CT findings: None.              CHEST:     No abnormal FDG uptake is seen in the heart, lungs, pleura, esophagus, hila/mediastinum, axilla, or breasts. Ancillary chest CT findings: None.              ABDOMEN/PELVIS:     No abnormal FDG uptake is seen in the liver, spleen, gallbladder,  pancreas, adrenals, peritoneum, extraperitoneum, nodes, or reproductive tract. Ancillary abdomen and pelvis CT findings: None.              MUSCULOSKELETAL:     No abnormal FDG uptake is seen in the soft tissues or bones. Ancillary musculoskeletal CT findings: Bilateral  os acromiale. Degenerative changes in the lower back, SI joints, hips, and knees. Calcific tendinitis of the right Achilles tendon.         Expected physiologic activity within the kidneys, ureter, bladder, oropharynx, salivary glands, stomach, bowel and brain.    PATHOLOGY:  ACCESSION NUMBER: [id number] RECEIVED: 04/07/2015 ORDERING PHYSICIAN: CESAR [name] , MD PATIENT NAME: Vela Prose BONE MARROW REPORT   Bone Marrow (BM) and Peripheral Blood (PB) FINAL PATHOLOGIC DIAGNOSIS  BONE MARROW: Normocellular marrow (40%) with approximately 2-4% plasma cells. See comment.  PERIPHERAL BLOOD: Mild normocytic anemia and leucopenia. No rouleaux formation. See CBC data.  COMMENT: The bone marrow aspirate smears are paucispicular, paucicellular and adequate for evaluation. There is no increase in plasma cells (1% by manual differential counts). Blasts are not increased in numbers (less than 1% by manual differential counts). The myeloid and erythroid elements are present in normal proportions with an M: E ratio of 1.8:1. There are no overt dysplastic features of the myeloid or erythroid lineages. Megakaryocytes are quantitatively and qualitatively unremarkable.  Examination of the bone marrow core biopsy is hampered by aspiration artifact, but reveals an overall normocellular marrow for the patient's age (40%). Myeloid and erythroid elements are present in normal proportions. Megakaryocytes are morphologically unremarkable and are present without clustering. There are no atypical lymphoid aggregates or granulomatous formations identified. CD138 immunohistochemistry reveals approximately 2-4% plasma  cells. In situ hybridization could not be performed on the core biopsy. No plasma cell clusters are seen. Examination of the clot sections reveals mostly peripheral blood with a single bone marrow spicule.  The peripheral blood smears reveal no rouleaux formation. The peripheral blood leukocytes are morphologically unremarkable.  The positive immunohistochemical controls were performed and worked appropriately.  CYTOGENETICS Date Ordered: 04/07/2015 Date Reported: 04/13/2015 Interpretation  Laboratory Analysis:  GTG-banded Metaphases: # Cells Karyotyped: Band Resolution:   Molecular Cytogenetic Analysis: (chromosomes pending)   .nuc ish (FGFR3,IgH)x2,(CCND1,IgH)x2,(CEP12,D13S319,LAMP1)x2,(ATM,p53)x2   Molecular Cytogenetic Analysis - FISH: Normal: The investigative technique of molecular-cytogenetic analysis was performed per physician's request using a DNA probe panel specific for regions associated with chromosomal abnormalities of Multiple Myeloma involving loci of chromosomes 4, 11, 12, 13, 14, and 17 on interphase nuclei.  0% of cells showed no fusion, gain/loss of IGH/FGFR3  This result is below the cut-off value of 2% and is technically negative.    ASSESSMENT/PLAN:  Multiple Myeloma IgG kappa, STAGE II, serum albumin 3.5 g/dl IgG of 04/14/2007 MG/DL, total M spike of 2.7 g/DL Beta-2 microglobulin of 3.7 MG/L Severe Anemia at presentation BMBX with 18% monoclonal plasma cells, kappa light chain restsricted. 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  Overall doing well. Continue with no dose changes.   Last seen by Dr. Tiana Loft on 12/01/2015.She is to continue with ongoing scheduled follow-up with them until formally discharged,.   I will  restart Zomata. I will discontinue her Bactrim. After a year, we will decrease her Acyclovir from 800 mg to 400 mg twice a day. All on recommendations from Dr. Tiana Loft.   I have referred her for a bone density scan.   She will return for a follow up in  1 month.   Orders Placed This Encounter  Procedures  . DG Bone Density    Standing Status:   Future    Number of Occurrences:   1    Standing Expiration Date:   12/14/2016    Order Specific Question:   Reason for Exam (SYMPTOM  OR DIAGNOSIS REQUIRED)    Answer:   high risk medications, myeloma    Order Specific Question:   Is the patient pregnant?    Answer:   No    Order Specific Question:   Preferred imaging location?    Answer:   [facility]  . CBC with Differential    Standing Status:   Future    Number of Occurrences:   1    Standing Expiration Date:   12/14/2016  . Comprehensive metabolic panel    Standing Status:   Future    Number of Occurrences:   1    Standing Expiration Date:   12/14/2016   All questions were answered. The patient knows to call the clinic with any problems, questions or concerns. We can certainly see the patient much sooner if necessary.   This document serves as a record of services personally performed by Ancil Linsey, MD. It was created on her behalf by Martinique Casey, a trained medical scribe. The creation of this record is based on the scribe's personal observations and the provider's statements to them. This document has been checked and approved by the attending provider.  I have reviewed the above documentation for accuracy and completeness, and I agree with the above.  This note is electronically signed.  Kelby Fam. Whitney Muse, MD

## 2015-12-15 NOTE — Patient Instructions (Addendum)
[facility] for Patients Receiving [facility]otherapy   Beginning January 23rd 2017 lab work for the [facility] will be done in the  Main lab at [facility] on 1st floor. If you have a lab appointment with the [facility] please come in thru the  Main Entrance and check in at the main information desk   Today you received the following chemotherapy agents Velcade injection as well as Zometa. Follow-up as scheduled. Call clinic for any questions or concerns  To help prevent nausea and vomiting after your treatment, we encourage you to take your nausea medication   If you develop nausea and vomiting, or diarrhea that is not controlled by your medication, call the clinic.  The clinic phone number is [phone]. Office hours are Monday-Friday 8:30am-5:00pm.  BELOW ARE SYMPTOMS THAT SHOULD BE REPORTED IMMEDIATELY:  *FEVER GREATER THAN 101.0 F  *CHILLS WITH OR WITHOUT FEVER  NAUSEA AND VOMITING THAT IS NOT CONTROLLED WITH YOUR NAUSEA MEDICATION  *UNUSUAL SHORTNESS OF BREATH  *UNUSUAL BRUISING OR BLEEDING  TENDERNESS IN MOUTH AND THROAT WITH OR WITHOUT PRESENCE OF ULCERS  *URINARY PROBLEMS  *BOWEL PROBLEMS  UNUSUAL RASH Items with * indicate a potential emergency and should be followed up as soon as possible. If you have an emergency after office hours please contact your primary care physician or go to the nearest emergency department.  Please call the clinic during office hours if you have any questions or concerns.   You may also contact the Patient Navigator at [phone] should you have any questions or need assistance in obtaining follow up care.      Resources For Cancer Patients and their Caregivers ? American Cancer Society: Can assist with transportation, wigs, general needs, runs Look Good Feel Better.        (407)118-6266 ? Cancer Care: Provides financial assistance, online support groups, medication/co-pay  assistance.  1-800-813-HOPE 214 470 3586) ? [place] Assists [place] cancer patients and their families through emotional , educational and financial support.  865 284 2033 ? [name] Where to apply for food stamps, Medicaid and utility assistance. 339-335-2548 ? [name]: Transportation to medical appointments. 307-513-1921 ? Social Security Administration: May apply for disability if have a Stage IV cancer. 340-538-9022 2128339533 ? [name], Disability and Transit Services: Assists with nutrition, care and transit needs. [phone]

## 2015-12-15 NOTE — Progress Notes (Signed)
Kelli Hurley tolerated Velcade injection and Zometa infusion well without complaints or incident. Labs reviewed prior to administration of these medications. Pt denied any tooth/jaw pain prior to administering Zometa. VSS upon discharge. Pt discharged self ambulatory in satisfactory condition

## 2015-12-15 NOTE — Patient Instructions (Addendum)
[place] at [facility] Discharge Instructions  RECOMMENDATIONS MADE BY THE CONSULTANT AND ANY TEST RESULTS WILL BE SENT TO YOUR REFERRING PHYSICIAN.  You saw Dr.Penland today. You will be scheduled for DEXA scan. Restart Zometa. Follow up in 1 month with labs. See Amy at checkout for appointments.  Thank you for choosing [place] at [facility] to provide your oncology and hematology care.  To afford each patient quality time with our provider, please arrive at least 15 minutes before your scheduled appointment time.   Beginning January 23rd 2017 lab work for the [name] will be done in the  Main lab at [facility] on 1st floor. If you have a lab appointment with the [facility] please come in thru the  Main Entrance and check in at the main information desk  You need to re-schedule your appointment should you arrive 10 or more minutes late.  We strive to give you quality time with our providers, and arriving late affects you and other patients whose appointments are after yours.  Also, if you no show three or more times for appointments you may be dismissed from the clinic at the providers discretion.     Again, thank you for choosing [facility].  Our hope is that these requests will decrease the amount of time that you wait before being seen by our physicians.       _____________________________________________________________  Should you have questions after your visit to [facility], please contact our office at [phone] between the hours of 8:30 a.m. and 4:30 p.m.  Voicemails left after 4:30 p.m. will not be returned until the following business day.  For prescription refill requests, have your pharmacy contact our office.         Resources For Cancer Patients and their Caregivers ? American Cancer Society: Can assist with transportation, wigs, general needs, runs Look Good Feel Better.         947-579-3984 ? Cancer Care: Provides financial assistance, online support groups, medication/co-pay assistance.  1-800-813-HOPE 325 569 9679) ? [place] Assists [place] cancer patients and their families through emotional , educational and financial support.  214-505-0728 ? [name] Where to apply for food stamps, Medicaid and utility assistance. 660-555-6897 ? [name]: Transportation to medical appointments. (315) 091-6685 ? Social Security Administration: May apply for disability if have a Stage IV cancer. (434)318-6668 512-766-4410 ? [name], Disability and Transit Services: Assists with nutrition, care and transit needs. [place] Support Programs: @10RELATIVEDAYS @ > Cancer Support Group  2nd Tuesday of the month 1pm-2pm, Journey Room  > Creative Journey  3rd Tuesday of the month 1130am-1pm, Journey Room  > Look Good Feel Better  1st Wednesday of the month 10am-12 noon, Journey Room (Call [place] to register [phone])

## 2015-12-16 ENCOUNTER — Encounter (HOSPITAL_COMMUNITY): Payer: Self-pay | Admitting: Hematology & Oncology

## 2015-12-16 LAB — PROTEIN ELECTROPHORESIS, SERUM
A/G Ratio: 1.2 (ref 0.7–1.7)
ALBUMIN ELP: 3.9 g/dL (ref 2.9–4.4)
ALPHA-1-GLOBULIN: 0.2 g/dL (ref 0.0–0.4)
Alpha-2-Globulin: 0.7 g/dL (ref 0.4–1.0)
BETA GLOBULIN: 1.1 g/dL (ref 0.7–1.3)
GAMMA GLOBULIN: 1.3 g/dL (ref 0.4–1.8)
Globulin, Total: 3.3 g/dL (ref 2.2–3.9)
M-Spike, %: 0.3 g/dL — ABNORMAL HIGH
Total Protein ELP: 7.2 g/dL (ref 6.0–8.5)

## 2015-12-16 LAB — KAPPA/LAMBDA LIGHT CHAINS
KAPPA FREE LGHT CHN: 21.9 mg/L — AB (ref 3.3–19.4)
KAPPA, LAMDA LIGHT CHAIN RATIO: 1.75 — AB (ref 0.26–1.65)
LAMDA FREE LIGHT CHAINS: 12.5 mg/L (ref 5.7–26.3)

## 2015-12-17 LAB — IMMUNOFIXATION ELECTROPHORESIS
IGA: 80 mg/dL — AB (ref 87–352)
IGG (IMMUNOGLOBIN G), SERUM: 1261 mg/dL (ref 700–1600)
IGM, SERUM: 53 mg/dL (ref 26–217)
TOTAL PROTEIN ELP: 7.4 g/dL (ref 6.0–8.5)

## 2015-12-23 ENCOUNTER — Ambulatory Visit (HOSPITAL_COMMUNITY)
Admission: RE | Admit: 2015-12-23 | Discharge: 2015-12-23 | Disposition: A | Discharge status: Home / Self Care | Payer: Medicare Other | Source: Ambulatory Visit | Attending: Hematology & Oncology | Admitting: Hematology & Oncology

## 2015-12-23 DIAGNOSIS — C9000 Multiple myeloma not having achieved remission: Secondary | ICD-10-CM | POA: Diagnosis not present

## 2015-12-23 DIAGNOSIS — Z79899 Other long term (current) drug therapy: Secondary | ICD-10-CM | POA: Insufficient documentation

## 2015-12-23 DIAGNOSIS — E2839 Other primary ovarian failure: Secondary | ICD-10-CM | POA: Diagnosis not present

## 2016-01-10 ENCOUNTER — Other Ambulatory Visit (HOSPITAL_COMMUNITY): Payer: Self-pay | Admitting: Oncology

## 2016-01-10 DIAGNOSIS — E876 Hypokalemia: Secondary | ICD-10-CM

## 2016-01-11 ENCOUNTER — Other Ambulatory Visit (HOSPITAL_COMMUNITY): Payer: Self-pay | Admitting: Oncology

## 2016-01-11 DIAGNOSIS — E876 Hypokalemia: Secondary | ICD-10-CM

## 2016-01-12 ENCOUNTER — Encounter (HOSPITAL_BASED_OUTPATIENT_CLINIC_OR_DEPARTMENT_OTHER): Payer: Medicare Other

## 2016-01-12 ENCOUNTER — Encounter (HOSPITAL_COMMUNITY): Payer: Medicare Other | Attending: Oncology | Admitting: Oncology

## 2016-01-12 ENCOUNTER — Encounter (HOSPITAL_COMMUNITY): Payer: Self-pay | Admitting: Oncology

## 2016-01-12 ENCOUNTER — Encounter (HOSPITAL_COMMUNITY): Payer: Medicare Other

## 2016-01-12 VITALS — BP 134/81 | HR 84 | Temp 98.3°F | Resp 18 | Ht 62.0 in | Wt 320.0 lb

## 2016-01-12 VITALS — BP 154/90 | HR 74 | Temp 97.8°F | Resp 18

## 2016-01-12 DIAGNOSIS — Z5112 Encounter for antineoplastic immunotherapy: Secondary | ICD-10-CM | POA: Diagnosis not present

## 2016-01-12 DIAGNOSIS — C9000 Multiple myeloma not having achieved remission: Secondary | ICD-10-CM

## 2016-01-12 DIAGNOSIS — C9001 Multiple myeloma in remission: Secondary | ICD-10-CM | POA: Diagnosis not present

## 2016-01-12 DIAGNOSIS — R05 Cough: Secondary | ICD-10-CM

## 2016-01-12 DIAGNOSIS — R059 Cough, unspecified: Secondary | ICD-10-CM

## 2016-01-12 LAB — COMPREHENSIVE METABOLIC PANEL
ALT: 13 U/L — AB (ref 14–54)
ANION GAP: 7 (ref 5–15)
AST: 19 U/L (ref 15–41)
Albumin: 4.2 g/dL (ref 3.5–5.0)
Alkaline Phosphatase: 49 U/L (ref 38–126)
BUN: 17 mg/dL (ref 6–20)
CHLORIDE: 103 mmol/L (ref 101–111)
CO2: 28 mmol/L (ref 22–32)
CREATININE: 0.78 mg/dL (ref 0.44–1.00)
Calcium: 9.5 mg/dL (ref 8.9–10.3)
Glucose, Bld: 87 mg/dL (ref 65–99)
POTASSIUM: 3.8 mmol/L (ref 3.5–5.1)
Sodium: 138 mmol/L (ref 135–145)
Total Bilirubin: 0.7 mg/dL (ref 0.3–1.2)
Total Protein: 7.8 g/dL (ref 6.5–8.1)

## 2016-01-12 LAB — CBC WITH DIFFERENTIAL/PLATELET
Basophils Absolute: 0 10*3/uL (ref 0.0–0.1)
Basophils Relative: 0 %
EOS PCT: 4 %
Eosinophils Absolute: 0.2 10*3/uL (ref 0.0–0.7)
HCT: 35.1 % — ABNORMAL LOW (ref 36.0–46.0)
Hemoglobin: 11.7 g/dL — ABNORMAL LOW (ref 12.0–15.0)
LYMPHS ABS: 1.2 10*3/uL (ref 0.7–4.0)
LYMPHS PCT: 24 %
MCH: 29.5 pg (ref 26.0–34.0)
MCHC: 33.3 g/dL (ref 30.0–36.0)
MCV: 88.4 fL (ref 78.0–100.0)
MONO ABS: 0.5 10*3/uL (ref 0.1–1.0)
MONOS PCT: 9 %
Neutro Abs: 3.2 10*3/uL (ref 1.7–7.7)
Neutrophils Relative %: 63 %
PLATELETS: 201 10*3/uL (ref 150–400)
RBC: 3.97 MIL/uL (ref 3.87–5.11)
RDW: 14.4 % (ref 11.5–15.5)
WBC: 5.1 10*3/uL (ref 4.0–10.5)

## 2016-01-12 LAB — MAGNESIUM: Magnesium: 1.8 mg/dL (ref 1.7–2.4)

## 2016-01-12 LAB — LACTATE DEHYDROGENASE: LDH: 153 U/L (ref 98–192)

## 2016-01-12 MED ORDER — ZOLEDRONIC ACID 4 MG/100ML IV SOLN
4.0000 mg | Freq: Once | INTRAVENOUS | Status: AC
  Administered 2016-01-12: 4 mg via INTRAVENOUS
  Filled 2016-01-12: qty 100

## 2016-01-12 MED ORDER — BORTEZOMIB CHEMO SQ INJECTION 3.5 MG (2.5MG/ML)
1.3000 mg/m2 | Freq: Once | INTRAMUSCULAR | Status: AC
  Administered 2016-01-12: 3.25 mg via SUBCUTANEOUS
  Filled 2016-01-12: qty 3.25

## 2016-01-12 MED ORDER — SODIUM CHLORIDE 0.9 % IV SOLN
INTRAVENOUS | Status: DC
  Administered 2016-01-12: 11:00:00 via INTRAVENOUS

## 2016-01-12 MED ORDER — HYDROCOD POLST-CPM POLST ER 10-8 MG/5ML PO SUER: 5.0000 mL | Freq: Every evening | ORAL | 0 refills | Status: DC | PRN

## 2016-01-12 MED ORDER — ZOLEDRONIC ACID 4 MG/5ML IV CONC: 4.0000 mg | Freq: Once | INTRAVENOUS | Status: DC

## 2016-01-12 MED ORDER — PROCHLORPERAZINE MALEATE 10 MG PO TABS
10.0000 mg | ORAL_TABLET | Freq: Once | ORAL | Status: AC
  Administered 2016-01-12: 10 mg via ORAL
  Filled 2016-01-12: qty 1

## 2016-01-12 NOTE — Assessment & Plan Note (Addendum)
IgG kappa multiple myeloma, Stage II, presenting with severe anemia.  Bone marrow aspiration and biopsy at time of diagnosis demonstrated 18% monoclonal plasma cells, kappa light chain restricted.  Cytogenetics demonstrated a 13q- and 17p- (high risk disease).  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 Rodrigues.  Oncology history is updated.  Labs today: CBC diff, CMET, Mg, SPEP+IFE, light chain assay, B2M.  I personally reviewed and went over laboratory results with the patient.  The results are noted within this dictation.    Labs every 2 weeks: CBC diff, CMET, Mg.  After a year, we will decrease her Acyclovir from 800 mg to 400 mg twice a day.   I personally reviewed and went over radiographic studies with the patient.  The results are noted within this dictation.    Bone density on last month demonstrated normal bone density.  Dr. Rodriguez's note from 12/01/2015 is reviewed: ASSESSMENT: IgG kappa myeloma with high risk features, Stage II by R-ISS who has achieved a PR prior to transplant. Remains in PR (with questionable VGPR).   PLAN:   IgG kappa myeloma with high risk features (-17p and -13q), Stage II by R-ISS: Day + 100 PET/CT and bone marrow biopsy did not show any evidence of myeloma, however, M-Spike remained detectable at 0.35 and 0.28. Immunofixation monoclonal Kappa X 2. Myeloma markers are stable. Continue maintenance therapy with Velcade 1.3 mg/m2 every other week (Izazomib was too expensive per patient). Continue myeloma markers monthly.    Bisphosphonate therapy recommend restarting Zometa locally if has not already.    Prophylaxis: Discontinue Bactrim today. Continue antiviral therapy with Acyclovir 800 mg twice daily for the suppression of HSV and VZV for a total of 1 year. Decrease to 400 mg twice daily after one year anniversary while on Velcade.  Immunization Schedule  Post Transplant  6 Months 12/01/15 8 Months 10 Months 12 Months 30+ Months  HepB PCV13 DTaP HIB IPV HepB PCV13 DTaP HIB IPV PCV13 DTaP HIB IPV HepB PCV13 if cGVHD or PPSV23 if no GVHD  MMR   Influenza Annually 12/01/15   Follow-up: she will return to [facility] in 3 months for an assessment and re-immunization.    Health promotion: Continue exercise regimen with [employer] and walking in pool. We spoke about making dental and eye exam appointments. Asked her to talk to Dr. Penland about setting up a bone density scan. She has an upcoming mammogram. Additional health maintenance suggested per table below:   Dental exam Eye exam Bone density scan Colonoscopy Labs (vitamin D, iron panel, B12, folic acid)  Recommended frequency Twice a year Once a year Once every 2 years Variable Once a year  Last exam due due due 2008 03/2015  Next due due due due 2018 03/2016    She reports a dry cough at bedtime.  She denies any sputum production.  She notes that it keeps her up at night.  She denies any fevers or chills.  Tessalon cost her $18 in Feb 2017 and therefore she is looking for a more cost effective option.  She notes that it was effective.  Rx for Tussionex is printed.  Other option is OTC Delsym.  She complains of knee pain bilaterally.  She has tried Flexogenix without improvement.  She has seen Dr. Harrison in 2015.  I have offered a referral back to him, but she declines at   this time.  She is due for Velcade and Zometa today.  Return in 2 weeks for ongoing Velcade.  Return in 4 weeks for follow-up. 

## 2016-01-12 NOTE — Patient Instructions (Signed)
[facility] Discharge Instructions for Patients Receiving Chemotherapy   Beginning January 23rd 2017 lab work for the [facility] will be done in the  Main lab at [facility] on 1st floor. If you have a lab appointment with the [facility] please come in thru the  Main Entrance and check in at the main information desk   Today you received the following chemotherapy agents Velcade as well as Zometa. Follow-up as scheduled. Call clinic for any questions or concerns  To help prevent nausea and vomiting after your treatment, we encourage you to take your nausea medication   If you develop nausea and vomiting, or diarrhea that is not controlled by your medication, call the clinic.  The clinic phone number is [phone]. Office hours are Monday-Friday 8:30am-5:00pm.  BELOW ARE SYMPTOMS THAT SHOULD BE REPORTED IMMEDIATELY:  *FEVER GREATER THAN 101.0 F  *CHILLS WITH OR WITHOUT FEVER  NAUSEA AND VOMITING THAT IS NOT CONTROLLED WITH YOUR NAUSEA MEDICATION  *UNUSUAL SHORTNESS OF BREATH  *UNUSUAL BRUISING OR BLEEDING  TENDERNESS IN MOUTH AND THROAT WITH OR WITHOUT PRESENCE OF ULCERS  *URINARY PROBLEMS  *BOWEL PROBLEMS  UNUSUAL RASH Items with * indicate a potential emergency and should be followed up as soon as possible. If you have an emergency after office hours please contact your primary care physician or go to the nearest emergency department.  Please call the clinic during office hours if you have any questions or concerns.   You may also contact the Patient Navigator at [phone] should you have any questions or need assistance in obtaining follow up care.      Resources For Cancer Patients and their Caregivers ? American Cancer Society: Can assist with transportation, wigs, general needs, runs Look Good Feel Better.        (770)133-3941 ? Cancer Care: Provides financial assistance, online support groups, medication/co-pay assistance.   1-800-813-HOPE 267-072-2232) ? [place] Assists [place] cancer patients and their families through emotional , educational and financial support.  7081703726 ? [name] Where to apply for food stamps, Medicaid and utility assistance. 985-115-4512 ? [name]: Transportation to medical appointments. 682-639-8271 ? Social Security Administration: May apply for disability if have a Stage IV cancer. 669-473-8591 351-640-1179 ? [name], Disability and Transit Services: Assists with nutrition, care and transit needs. [phone]

## 2016-01-12 NOTE — Patient Instructions (Addendum)
Kelli Hurley at [facility] Discharge Instructions  RECOMMENDATIONS MADE BY THE CONSULTANT AND ANY TEST RESULTS WILL BE SENT TO YOUR REFERRING PHYSICIAN.  You were seen today by Kelli Crigler PA-C. For your cough, I printed a prescription for Tussionex.  I do not know how much it will cost.  If too expensive, then please call us.  We can try Tessalon again, but as you mentioned it was $18.  Another more affordable option would be Delsym (OTC). Treatment today and every 2 weeks. Zometa today and every 4 weeks. I have offered a referral to Dr. Aline Hurley for knee pain.  You would like to think about this.  If you want a referral, please let us know. Return in 4 weeks for follow-up.   Thank you for choosing [name] at [name] to provide your oncology and hematology care.  To afford each patient quality time with our provider, please arrive at least 15 minutes before your scheduled appointment time.   Beginning January 23rd 2017 lab work for the [name] will be done in the  Main lab at [facility] on 1st floor. If you have a lab appointment with the [name] please come in thru the  Main Entrance and check in at the main information desk  You need to re-schedule your appointment should you arrive 10 or more minutes late.  We strive to give you quality time with our providers, and arriving late affects you and other patients whose appointments are after yours.  Also, if you no show three or more times for appointments you may be dismissed from the clinic at the providers discretion.     Again, thank you for choosing [name].  Our hope is that these requests will decrease the amount of time that you wait before being seen by our physicians.       _____________________________________________________________  Should you have questions after your visit to [name], please contact our office at [phone]  between the hours of 8:30 a.m. and 4:30 p.m.  Voicemails left after 4:30 p.m. will not be returned until the following business day.  For prescription refill requests, have your pharmacy contact our office.         Resources For Cancer Patients and their Caregivers ? American Cancer Society: Can assist with transportation, wigs, general needs, runs Look Good Feel Better.        9401987099 ? Cancer Care: Provides financial assistance, online support groups, medication/co-pay assistance.  1-800-813-HOPE 608-507-9582) ? [name] Assists [place] cancer patients and their families through emotional , educational and financial support.  (434)503-7639 ? [name] Where to apply for food stamps, Medicaid and utility assistance. (331)252-0412 ? [name]: Transportation to medical appointments. 936-283-4412 ? Social Security Administration: May apply for disability if have a Stage IV cancer. 7474830374 (548)257-8189 ? [name], Disability and Transit Services: Assists with nutrition, care and transit needs. [name]: @10RELATIVEDAYS @ > Cancer Support Group  2nd Tuesday of the month 1pm-2pm, Journey Room  > Creative Journey  3rd Tuesday of the month 1130am-1pm, Journey Room  > Look Good Feel Better  1st Wednesday of the month 10am-12 noon, Journey Room (Call [name] to register [phone])

## 2016-01-12 NOTE — Progress Notes (Signed)
Kelli Hurley tolerated Velcade injection and Zometa infusion well without complaints or incident. Labs reviewed prior to administering both medications and pt denied any tooth,jaw or leg pain as well.VSS upon discharge.Pt discharged self ambulatory in satisfactory condition

## 2016-01-12 NOTE — Progress Notes (Signed)
Kelli Curb, PA-C [address] [place] [address]  Multiple myeloma in remission (HCC)  Cough - Plan: chlorpheniramine-HYDROcodone (TUSSIONEX) 10-8 MG/5ML SUER  CURRENT THERAPY: Velcade every 2 weeks and Zometa monthly 4 mg  INTERVAL HISTORY: Kelli Hurley 60 y.o. female returns for followup of multiple myeloma IgG kappa, Stage II, S/P bone marrow aspiration and biopsy showing 18% monoclonal plasma cells, kappa light chain restricted and cytogenetics demonstrating 13q-, 17p- (high risk disease).  Induction therapy beginning on 10/05/2014 with autologous transplant by Dr. Norma Fredrickson on 05/08/2015.  Now on maintenance therapy with Velcade.    Multiple myeloma ([place])   08/07/2014 Imaging    Bone Survey- No lytic lesions are noted in the visualized skeleton.      09/03/2014 Bone Marrow Biopsy    NORMOCELLULAR BONE MARROW FOR AGE WITH PLASMA CELL NEOPLASM.  The plasma cell component is increased in the marrow representing an estimated 18% of all cells. Cytogenetics with 13q-, 17p- (high risk disease)      09/03/2014 Pathology Results    Cytogenetics with 13q-, 17p- (high risk disease)      09/23/2014 Initial Diagnosis    Multiple myeloma      09/30/2014 PET scan    No abnormal hypermetabolism in the neck, chest, abdomen or pelvis.      10/05/2014 - 03/22/2015 Chemotherapy    RVD      10/28/2014 Treatment Plan Change    Issues related to getting Revlimid in a timely fashion, therefore, she received her Revlimid on 9/21 resulting in a 12 day cycle this time instead of a 14 day cycle      11/02/2014 Imaging    CTA chest- No evidence for a large or central pulmonary embolism as described.  8 mm density along the right minor fissure could represent focal pleural thickening but indeterminate. If the patient is at high risk for bronchogenic carcinoma, follow-up c      11/23/2014 Miscellaneous    Zometa 4 mg IV monthly      04/07/2015 Bone Marrow Biopsy   Normocellular marrow with 2-4% clonal plasma cells by immunohistochemistry. FISH and cytogenetics were normal [facility])        04/2015 Miscellaneous    PRETRANSPLANT EVALUATION:  Pulmonary function tests: FEV1 100.3% / DLCO 97.9%  Echocardiogram: Normal LV function with EF 60-65%       04/27/2015 Procedure    Stem cell mobilization with filgrastim and Mozobil [facility])      05/06/2015 Miscellaneous    BMT conditioning regimen with high-dose Melphalan given [facility], Kelli [facility]); Day -1      05/07/2015 Bone Marrow Transplant    Outpatient autologous stem cell transplant [facility], Kelli Hurley); Day 0      05/18/2015 Miscellaneous    WBC engraftment;  did not require platelet transfusion during her transplant process. Lowest platelet count 28,000       05/20/2015 Procedure    Tunneled catheter removed [facility])      09/08/2015 -  Chemotherapy    Velcade every 2 weeks      12/15/2015 Miscellaneous    Zometa re-instituted.       12/23/2015 Imaging    Bone density- BMD as determined from Forearm Radius 33% is 0.799 g/cm2 with a T-Score of 1.2. This patient is considered normal according to [facility]) criteria.       She is tolerating treatment well.    She has follow-up with Dr. Norma Fredrickson on  03/09/2015.  She reports a cough at bedtime.  This is keeping her up at night.  She denies any sputum production.  She denies any fevers, no chills.  She reports Tessalon working in the past, but it was too expensive ($18).    She notes bilateral knee pain.  This has been a chronic issue.  She notes that it is progressive.  She has tried Flexogenix without improvement.  She has seen Ortho in the past (2015).  Review of Systems  Constitutional: Negative.  Negative for chills, fever and weight loss.  HENT: Negative.   Eyes: Negative.   Respiratory: Positive for cough.   Cardiovascular: Negative.  Negative for chest pain.  Gastrointestinal: Negative.  Negative for  constipation, diarrhea, nausea and vomiting.  Genitourinary: Negative.  Negative for dysuria.  Musculoskeletal: Negative.   Skin: Negative.   Neurological: Negative.  Negative for weakness.  Endo/Heme/Allergies: Negative.   Psychiatric/Behavioral: Negative.     Past Medical History:  Diagnosis Date  . Anemia   . Claustrophobia 10/05/2014  . Hypertension   . Leukopenia 08/03/2014  . Normocytic hypochromic anemia 08/03/2014    Past Surgical History:  Procedure Laterality Date  . ABDOMINAL HYSTERECTOMY    . OTHER SURGICAL HISTORY     heart surgery as infant to "repair hole in heart"    Family History  Problem Relation Age of Onset  . Cancer Mother   . Hypertension Mother   . Cancer Father   . Hypertension Father   . Cancer Maternal Grandmother     Social History   Social History  . Marital status: Legally Separated    Spouse name: N/A  . Number of children: N/A  . Years of education: N/A   Social History Main Topics  . Smoking status: Never Smoker  . Smokeless tobacco: Never Used  . Alcohol use No  . Drug use: No  . Sexual activity: Not Asked     Comment: divorced- 2 daughters   Other Topics Concern  . None   Social History Narrative  . None     PHYSICAL EXAMINATION  ECOG PERFORMANCE STATUS: 1 - Symptomatic but completely ambulatory  Vitals:   01/12/16 0930  BP: 134/81  Pulse: 84  Resp: 18  Temp: 98.3 F (36.8 C)    GENERAL:alert, no distress, well nourished, well developed, comfortable, cooperative, obese, smiling and unaccompanied SKIN: skin color, texture, turgor are normal, no rashes or significant lesions HEAD: Normocephalic, No masses, lesions, tenderness or abnormalities EYES: normal, EOMI, Conjunctiva are pink and non-injected EARS: External ears normal OROPHARYNX:lips, buccal mucosa, and tongue normal and mucous membranes are moist  NECK: supple, trachea midline LYMPH:  no palpable lymphadenopathy BREAST:not examined LUNGS: clear to  auscultation and percussion HEART: regular rate & rhythm, no murmurs, no gallops, S1 normal and S2 normal ABDOMEN:abdomen soft and normal bowel sounds BACK: Back symmetric, no curvature. EXTREMITIES:less then 2 second capillary refill, no joint deformities, effusion, or inflammation, no skin discoloration, no cyanosis  NEURO: alert & oriented x 3 with fluent speech, no focal motor/sensory deficits, gait normal   LABORATORY DATA: CBC    Component Value Date/Time   WBC 5.1 01/12/2016 0858   RBC 3.97 01/12/2016 0858   HGB 11.7 (L) 01/12/2016 0858   HCT 35.1 (L) 01/12/2016 0858   PLT 201 01/12/2016 0858   MCV 88.4 01/12/2016 0858   MCH 29.5 01/12/2016 0858   MCHC 33.3 01/12/2016 0858   RDW 14.4 01/12/2016 0858   LYMPHSABS 1.2 01/12/2016 6644  MONOABS 0.5 01/12/2016 0858   EOSABS 0.2 01/12/2016 0858   BASOSABS 0.0 01/12/2016 0858      Chemistry      Component Value Date/Time   NA 138 01/12/2016 0858   K 3.8 01/12/2016 0858   CL 103 01/12/2016 0858   CO2 28 01/12/2016 0858   BUN 17 01/12/2016 0858   CREATININE 0.78 01/12/2016 0858      Component Value Date/Time   CALCIUM 9.5 01/12/2016 0858   ALKPHOS 49 01/12/2016 0858   AST 19 01/12/2016 0858   ALT 13 (L) 01/12/2016 0858   BILITOT 0.7 01/12/2016 0858      PENDING LABS:   RADIOGRAPHIC STUDIES:  Dg Bone Density  Result Date: 12/23/2015 EXAM: DUAL X-RAY ABSORPTIOMETRY (DXA) FOR BONE MINERAL DENSITY IMPRESSION: Ordering Physician:  Dr. Patrici Ranks, Your patient Kelli Hurley completed a BMD test on 12/23/2015 using the [place] (software version: 14.10) manufactured by [employer]. The following summarizes the results of our evaluation. PATIENT BIOGRAPHICAL: Name: Kelli Hurley, Kelli Hurley Patient ID: [id number] Birth Date: 01/22/1956 Height: 62.0 in. Gender: Female Exam Date: 12/23/2015 Weight: 319.0 lbs. Indications: Bilateral Oophrectomy, Low Calcium Intake, Post Menopausal Fractures:  Treatments: Multivitamin DENSITOMETRY RESULTS: Site          Region     Measured Date Measured Age WHO Classification Young Adult T-score BMD         %Change vs. Previous Significant Change (*) Right Forearm Radius 33% 12/23/2015 [age] Normal 1.3 0.807 g/cm2 Left Forearm Radius 33% 12/23/2015 60.0 Normal 1.2 0.799 g/cm2 ASSESSMENT: BMD as determined from Forearm Radius 33% is 0.799 g/cm2 with a T-Score of 1.2. This patient is considered normal according to [facility]) criteria. (Lumbar spine and dual femur was not utilized due to patients weight is over our table limit.) (Patient is not a candidate for FRAX assessment due to unable to obtain femur images.) World Health Organization [facility]) criteria for post-menopausal, Caucasian Women: Normal:       T-score at or above -1 SD Osteopenia:   T-score between -1 and -2.5 SD Osteoporosis: T-score at or below -2.5 SD RECOMMENDATIONS: [place] recommends that FDA-approved medial therapies be considered in postmenopausal women and men age 87 or older with a: 1. Hip or vertebral (clinical or morphometric) fracture. 2. T-Score of < -2.5 at the spine or hip. 3. Ten-year fracture probability by FRAX of 3% or greater for hip fracture or 20% or greater for major osteoporotic fracture. All treatment decisions require clinical judgment and consideration of individual patient factors, including patient preferences, co-morbidities, previous drug use, risk factors not captured in the FRAX model (e.g. falls, vitamin D deficiency, increased bone turnover, interval significant decline in bone density) and possible under-or over-estimation of fracture risk by FRAX. All patients should ensure an adequate intake of dietary calcium (1200 mg/d) and vitamin D (800 IU daily) unless contraindicated. FOLLOW-UP: People with diagnosed cases of osteoporosis or osteopenia should be regularly tested for bone mineral density. For patients eligible for Medicare,  routine testing is allowed once every 2 years. Testing frequency can be increased for patients who have rapidly progressing disease, or for those who are receiving medical therapy to restore bone mass. I have reviewed this report, and agree with the above findings. [facility] Electronically Signed   By: Ilona Sorrel M.D.   On: 12/23/2015 14:15     PATHOLOGY:    ASSESSMENT AND PLAN:  Multiple myeloma IgG kappa multiple myeloma,  Stage II, presenting with severe anemia.  Bone marrow aspiration and biopsy at time of diagnosis demonstrated 18% monoclonal plasma cells, kappa light chain restricted.  Cytogenetics demonstrated a 13q- and 17p- (high risk disease).  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 Debbrah Alar.  Oncology history is updated.  Labs today: CBC diff, CMET, Mg, SPEP+IFE, light chain assay, B2M.  I personally reviewed and went over laboratory results with the patient.  The results are noted within this dictation.    Labs every 2 weeks: CBC diff, CMET, Mg.  After a year, we will decrease her Acyclovir from 800 mg to 400 mg twice a day.   I personally reviewed and went over radiographic studies with the patient.  The results are noted within this dictation.    Bone density on last month demonstrated normal bone density.  Dr. Rossie Muskrat note from 12/01/2015 is reviewed: ASSESSMENT: IgG kappa myeloma with high risk features, Stage II by R-ISS who has achieved a PR prior to transplant. Remains in PR (with questionable VGPR).   PLAN:   IgG kappa myeloma with high risk features (-17p and -13q), Stage II by R-ISS: Day + 100 PET/CT and bone marrow biopsy did not show any evidence of myeloma, however, M-Spike remained detectable at 0.35 and 0.28. Immunofixation monoclonal Kappa X 2. Myeloma markers are stable. Continue maintenance therapy with Velcade 1.3 mg/m2 every other  week (Izazomib was too expensive per patient). Continue myeloma markers monthly.    Bisphosphonate therapy recommend restarting Zometa locally if has not already.    Prophylaxis: Discontinue Bactrim today. Continue antiviral therapy with Acyclovir 800 mg twice daily for the suppression of HSV and VZV for a total of 1 year. Decrease to 400 mg twice daily after one year anniversary while on Velcade.  Immunization Schedule Post Transplant  6 Months 12/01/15 8 Months 10 Months 12 Months 30+ Months  HepB PCV13 DTaP HIB IPV HepB PCV13 DTaP HIB IPV PCV13 DTaP HIB IPV HepB PCV13 if cGVHD or PPSV23 if no GVHD  MMR   Influenza Annually 12/01/15   Follow-up: she will return to see [place] at the [facility] in 3 months for an assessment and re-immunization.    Health promotion: Continue exercise regimen with [employer] and walking in pool. We spoke about making dental and eye exam appointments. Asked her to talk to [name] about setting up a bone density scan. She has an upcoming mammogram. Additional health maintenance suggested per table below:   Dental exam Eye exam Bone density scan Colonoscopy Labs (vitamin D, iron panel, Z61, folic acid)  Recommended frequency Twice a year Once a year Once every 2 years Variable Once a year  Last exam due due due 2008 03/2015  Next due due due due 2018 03/2016    She reports a dry cough at bedtime.  She denies any sputum production.  She notes that it keeps her up at night.  She denies any fevers or chills.  Tessalon cost her $18 in Feb 2017 and therefore she is looking for a more cost effective option.  She notes that it was effective.  Rx for Tussionex is printed.  Other option is OTC Delsym.  She complains of knee pain bilaterally.  She has tried Flexogenix without improvement.  She has seen Dr. Aline Brochure in 2015.  I have offered a referral back to him, but she declines at this time.  She  is due for Velcade and Zometa today.  Return in  2 weeks for ongoing Velcade.  Return in 4 weeks for follow-up.   ORDERS PLACED FOR THIS ENCOUNTER: No orders of the defined types were placed in this encounter.   MEDICATIONS PRESCRIBED THIS ENCOUNTER: Meds ordered this encounter  Medications  . PROAIR HFA 108 (90 Base) MCG/ACT inhaler  . chlorpheniramine-HYDROcodone (TUSSIONEX) 10-8 MG/5ML SUER    Sig: Take 5 mLs by mouth at bedtime as needed for cough.    Dispense:  140 mL    Refill:  0    Order Specific Question:   Supervising Provider    Answer:   Patrici Ranks [id number]    THERAPY PLAN:  Continue Velcade every 2 weeks.  Continue Zometa every 4 weeks.  All questions were answered. The patient knows to call the clinic with any problems, questions or concerns. We can certainly see the patient much sooner if necessary.  Patient and plan discussed with Dr. Ancil Linsey and she is in agreement with the aforementioned.   This note is electronically signed by: Doy Mince 01/12/2016 10:18 AM

## 2016-01-13 LAB — IGG, IGA, IGM
IGA: 84 mg/dL — AB (ref 87–352)
IGG (IMMUNOGLOBIN G), SERUM: 1376 mg/dL (ref 700–1600)
IGM, SERUM: 54 mg/dL (ref 26–217)

## 2016-01-14 LAB — IMMUNOFIXATION ELECTROPHORESIS
IGA: 88 mg/dL (ref 87–352)
IGM, SERUM: 56 mg/dL (ref 26–217)
IgG (Immunoglobin G), Serum: 1358 mg/dL (ref 700–1600)
Total Protein ELP: 7.1 g/dL (ref 6.0–8.5)

## 2016-01-16 ENCOUNTER — Encounter (HOSPITAL_COMMUNITY): Payer: Self-pay | Admitting: Hematology & Oncology

## 2016-01-17 LAB — PROTEIN ELECTROPHORESIS, SERUM
A/G Ratio: 1.2 (ref 0.7–1.7)
ALBUMIN ELP: 3.8 g/dL (ref 2.9–4.4)
ALPHA-1-GLOBULIN: 0.3 g/dL (ref 0.0–0.4)
Alpha-2-Globulin: 0.7 g/dL (ref 0.4–1.0)
BETA GLOBULIN: 1 g/dL (ref 0.7–1.3)
GAMMA GLOBULIN: 1.3 g/dL (ref 0.4–1.8)
Globulin, Total: 3.3 g/dL (ref 2.2–3.9)
M-Spike, %: 0.2 g/dL — ABNORMAL HIGH
TOTAL PROTEIN ELP: 7.1 g/dL (ref 6.0–8.5)

## 2016-01-25 ENCOUNTER — Other Ambulatory Visit (HOSPITAL_COMMUNITY): Payer: Self-pay | Admitting: Hematology & Oncology

## 2016-01-25 DIAGNOSIS — C9000 Multiple myeloma not having achieved remission: Secondary | ICD-10-CM

## 2016-01-25 DIAGNOSIS — Z79899 Other long term (current) drug therapy: Secondary | ICD-10-CM

## 2016-01-26 ENCOUNTER — Other Ambulatory Visit (HOSPITAL_COMMUNITY): Payer: Medicare Other

## 2016-01-26 ENCOUNTER — Encounter (HOSPITAL_COMMUNITY): Payer: Medicare Other

## 2016-01-26 DIAGNOSIS — C9001 Multiple myeloma in remission: Secondary | ICD-10-CM

## 2016-01-26 DIAGNOSIS — C9000 Multiple myeloma not having achieved remission: Secondary | ICD-10-CM

## 2016-01-26 LAB — COMPREHENSIVE METABOLIC PANEL
ALK PHOS: 51 U/L (ref 38–126)
ALT: 13 U/L — ABNORMAL LOW (ref 14–54)
ANION GAP: 6 (ref 5–15)
AST: 19 U/L (ref 15–41)
Albumin: 4.1 g/dL (ref 3.5–5.0)
BILIRUBIN TOTAL: 0.5 mg/dL (ref 0.3–1.2)
BUN: 14 mg/dL (ref 6–20)
CALCIUM: 9.6 mg/dL (ref 8.9–10.3)
CO2: 30 mmol/L (ref 22–32)
Chloride: 103 mmol/L (ref 101–111)
Creatinine, Ser: 0.84 mg/dL (ref 0.44–1.00)
GFR calc Af Amer: >60 mL/min (ref >60)
GLUCOSE: 95 mg/dL (ref 65–99)
POTASSIUM: 4.1 mmol/L (ref 3.5–5.1)
Sodium: 139 mmol/L (ref 135–145)
TOTAL PROTEIN: 7.6 g/dL (ref 6.5–8.1)

## 2016-01-26 LAB — CBC WITH DIFFERENTIAL/PLATELET
Basophils Absolute: 0 10*3/uL (ref 0.0–0.1)
Basophils Relative: 0 %
Eosinophils Absolute: 0.1 10*3/uL (ref 0.0–0.7)
Eosinophils Relative: 2 %
HEMATOCRIT: 35.7 % — AB (ref 36.0–46.0)
HEMOGLOBIN: 11.8 g/dL — AB (ref 12.0–15.0)
LYMPHS ABS: 1.5 10*3/uL (ref 0.7–4.0)
LYMPHS PCT: 31 %
MCH: 29.7 pg (ref 26.0–34.0)
MCHC: 33.1 g/dL (ref 30.0–36.0)
MCV: 89.9 fL (ref 78.0–100.0)
MONO ABS: 0.3 10*3/uL (ref 0.1–1.0)
MONOS PCT: 6 %
NEUTROS ABS: 3 10*3/uL (ref 1.7–7.7)
NEUTROS PCT: 61 %
Platelets: 215 10*3/uL (ref 150–400)
RBC: 3.97 MIL/uL (ref 3.87–5.11)
RDW: 14.2 % (ref 11.5–15.5)
WBC: 5 10*3/uL (ref 4.0–10.5)

## 2016-01-26 LAB — MAGNESIUM: MAGNESIUM: 1.7 mg/dL (ref 1.7–2.4)

## 2016-01-26 LAB — LACTATE DEHYDROGENASE: LDH: 142 U/L (ref 98–192)

## 2016-01-27 LAB — KAPPA/LAMBDA LIGHT CHAINS
Kappa free light chain: 17.5 mg/L (ref 3.3–19.4)
Kappa, lambda light chain ratio: 1.5 (ref 0.26–1.65)
LAMDA FREE LIGHT CHAINS: 11.7 mg/L (ref 5.7–26.3)

## 2016-01-27 LAB — IGG, IGA, IGM
IGA: 80 mg/dL — AB (ref 87–352)
IGM, SERUM: 53 mg/dL (ref 26–217)
IgG (Immunoglobin G), Serum: 1293 mg/dL (ref 700–1600)

## 2016-01-27 LAB — BETA 2 MICROGLOBULIN, SERUM: Beta-2 Microglobulin: 2.2 mg/L (ref 0.6–2.4)

## 2016-01-28 LAB — PROTEIN ELECTROPHORESIS, SERUM
A/G RATIO SPE: 1.3 (ref 0.7–1.7)
ALBUMIN ELP: 4 g/dL (ref 2.9–4.4)
ALPHA-1-GLOBULIN: 0.2 g/dL (ref 0.0–0.4)
ALPHA-2-GLOBULIN: 0.6 g/dL (ref 0.4–1.0)
BETA GLOBULIN: 0.9 g/dL (ref 0.7–1.3)
GAMMA GLOBULIN: 1.3 g/dL (ref 0.4–1.8)
Globulin, Total: 3 g/dL (ref 2.2–3.9)
M-Spike, %: 0.3 g/dL — ABNORMAL HIGH
Total Protein ELP: 7 g/dL (ref 6.0–8.5)

## 2016-01-28 LAB — IMMUNOFIXATION ELECTROPHORESIS
IGG (IMMUNOGLOBIN G), SERUM: 1370 mg/dL (ref 700–1600)
IgA: 83 mg/dL — ABNORMAL LOW (ref 87–352)
IgM, Serum: 52 mg/dL (ref 26–217)
TOTAL PROTEIN ELP: 7 g/dL (ref 6.0–8.5)

## 2016-02-09 ENCOUNTER — Encounter (HOSPITAL_COMMUNITY): Payer: Medicare Other | Attending: Oncology | Admitting: Oncology

## 2016-02-09 ENCOUNTER — Encounter (HOSPITAL_COMMUNITY): Payer: Medicare Other

## 2016-02-09 ENCOUNTER — Encounter (HOSPITAL_BASED_OUTPATIENT_CLINIC_OR_DEPARTMENT_OTHER): Payer: Medicare Other

## 2016-02-09 VITALS — BP 119/66 | HR 86 | Temp 98.1°F | Resp 20 | Wt 322.1 lb

## 2016-02-09 DIAGNOSIS — C9001 Multiple myeloma in remission: Secondary | ICD-10-CM | POA: Diagnosis not present

## 2016-02-09 DIAGNOSIS — E876 Hypokalemia: Secondary | ICD-10-CM

## 2016-02-09 DIAGNOSIS — C9000 Multiple myeloma not having achieved remission: Secondary | ICD-10-CM | POA: Diagnosis not present

## 2016-02-09 DIAGNOSIS — Z5111 Encounter for antineoplastic chemotherapy: Secondary | ICD-10-CM

## 2016-02-09 LAB — COMPREHENSIVE METABOLIC PANEL
ALT: 14 U/L (ref 14–54)
ANION GAP: 8 (ref 5–15)
AST: 19 U/L (ref 15–41)
Albumin: 4.3 g/dL (ref 3.5–5.0)
Alkaline Phosphatase: 56 U/L (ref 38–126)
BILIRUBIN TOTAL: 0.4 mg/dL (ref 0.3–1.2)
BUN: 18 mg/dL (ref 6–20)
CO2: 28 mmol/L (ref 22–32)
Calcium: 10 mg/dL (ref 8.9–10.3)
Chloride: 101 mmol/L (ref 101–111)
Creatinine, Ser: 0.94 mg/dL (ref 0.44–1.00)
Glucose, Bld: 94 mg/dL (ref 65–99)
POTASSIUM: 4.1 mmol/L (ref 3.5–5.1)
Sodium: 137 mmol/L (ref 135–145)
TOTAL PROTEIN: 7.9 g/dL (ref 6.5–8.1)

## 2016-02-09 LAB — CBC WITH DIFFERENTIAL/PLATELET
Basophils Absolute: 0 10*3/uL (ref 0.0–0.1)
Basophils Relative: 0 %
Eosinophils Absolute: 0.1 10*3/uL (ref 0.0–0.7)
Eosinophils Relative: 1 %
HEMATOCRIT: 37.3 % (ref 36.0–46.0)
Hemoglobin: 12.4 g/dL (ref 12.0–15.0)
LYMPHS PCT: 19 %
Lymphs Abs: 0.8 10*3/uL (ref 0.7–4.0)
MCH: 29.5 pg (ref 26.0–34.0)
MCHC: 33.2 g/dL (ref 30.0–36.0)
MCV: 88.8 fL (ref 78.0–100.0)
MONO ABS: 0.4 10*3/uL (ref 0.1–1.0)
MONOS PCT: 9 %
NEUTROS ABS: 2.9 10*3/uL (ref 1.7–7.7)
Neutrophils Relative %: 71 %
Platelets: 181 10*3/uL (ref 150–400)
RBC: 4.2 MIL/uL (ref 3.87–5.11)
RDW: 14 % (ref 11.5–15.5)
WBC: 4.2 10*3/uL (ref 4.0–10.5)

## 2016-02-09 LAB — MAGNESIUM: MAGNESIUM: 1.6 mg/dL — AB (ref 1.7–2.4)

## 2016-02-09 MED ORDER — ZOLEDRONIC ACID 4 MG/100ML IV SOLN
4.0000 mg | Freq: Once | INTRAVENOUS | Status: AC
  Administered 2016-02-09: 4 mg via INTRAVENOUS
  Filled 2016-02-09: qty 100

## 2016-02-09 MED ORDER — POTASSIUM CHLORIDE CRYS ER 20 MEQ PO TBCR: 20.0000 meq | EXTENDED_RELEASE_TABLET | Freq: Two times a day (BID) | ORAL | 2 refills | Status: DC

## 2016-02-09 MED ORDER — SODIUM CHLORIDE 0.9 % IV SOLN
INTRAVENOUS | Status: DC
  Administered 2016-02-09: 11:00:00 via INTRAVENOUS

## 2016-02-09 MED ORDER — ZOLEDRONIC ACID 4 MG/5ML IV CONC: 4.0000 mg | Freq: Once | INTRAVENOUS | Status: DC

## 2016-02-09 MED ORDER — BORTEZOMIB CHEMO SQ INJECTION 3.5 MG (2.5MG/ML)
1.3000 mg/m2 | Freq: Once | INTRAMUSCULAR | Status: AC
  Administered 2016-02-09: 3.25 mg via SUBCUTANEOUS
  Filled 2016-02-09: qty 3.25

## 2016-02-09 MED ORDER — PROCHLORPERAZINE MALEATE 10 MG PO TABS
10.0000 mg | ORAL_TABLET | Freq: Once | ORAL | Status: AC
  Administered 2016-02-09: 10 mg via ORAL
  Filled 2016-02-09: qty 1

## 2016-02-09 NOTE — Patient Instructions (Signed)
[facility] Discharge Instructions for Patients Receiving Chemotherapy   Beginning January 23rd 2017 lab work for the Cancer Center will be done in the  Main lab at [place] on 1st floor. If you have a lab appointment with the Cancer Center please come in thru the  Main Entrance and check in at the main information desk   Today you received the following chemotherapy agents Velcade injection as well as Zometa infusion. Follow-up as scheduled. Call clinic for any questions or concerns  To help prevent nausea and vomiting after your treatment, we encourage you to take your nausea medication   If you develop nausea and vomiting, or diarrhea that is not controlled by your medication, call the clinic.  The clinic phone number is [phone]. Office hours are Monday-Friday 8:30am-5:00pm.  BELOW ARE SYMPTOMS THAT SHOULD BE REPORTED IMMEDIATELY:  *FEVER GREATER THAN 101.0 F  *CHILLS WITH OR WITHOUT FEVER  NAUSEA AND VOMITING THAT IS NOT CONTROLLED WITH YOUR NAUSEA MEDICATION  *UNUSUAL SHORTNESS OF BREATH  *UNUSUAL BRUISING OR BLEEDING  TENDERNESS IN MOUTH AND THROAT WITH OR WITHOUT PRESENCE OF ULCERS  *URINARY PROBLEMS  *BOWEL PROBLEMS  UNUSUAL RASH Items with * indicate a potential emergency and should be followed up as soon as possible. If you have an emergency after office hours please contact your primary care physician or go to the nearest emergency department.  Please call the clinic during office hours if you have any questions or concerns.   You may also contact the Patient Navigator at [phone] should you have any questions or need assistance in obtaining follow up care.      Resources For Cancer Patients and their Caregivers ? American Cancer Society: Can assist with transportation, wigs, general needs, runs Look Good Feel Better.        1-888-227-6333 ? Cancer Care: Provides financial assistance, online support groups, medication/co-pay  assistance.  1-800-813-HOPE (4673) ? [facility] Assists [place] cancer patients and their families through emotional , educational and financial support.  336-427-4357 ? [name] Where to apply for food stamps, Medicaid and utility assistance. 336-342-1394 ? [name]: Transportation to medical appointments. 336-347-2287 ? Social Security Administration: May apply for disability if have a Stage IV cancer. 336-342-7796 1-800-772-1213 ? [place] Aging, Disability and Transit Services: Assists with nutrition, care and transit needs. 336-349-2343         

## 2016-02-09 NOTE — Assessment & Plan Note (Signed)
IgG kappa multiple myeloma, Stage II, presenting with severe anemia.  Bone marrow aspiration and biopsy at time of diagnosis demonstrated 18% monoclonal plasma cells, kappa light chain restricted.  Cytogenetics demonstrated a 13q- and 17p- (high risk disease).  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 Debbrah Alar.  Oncology history is updated.  Labs today: CBC diff, CMET, Mg. I personally reviewed and went over laboratory results with the patient.  The results are noted within this dictation.    Labs every 2 weeks: CBC diff, CMET, Mg.  Additional labs in 2 weeks: SPEP+IFE, light chain assay, B2M, LDH.  After a year, we will decrease her Acyclovir from 800 mg to 400 mg twice a day.   I personally reviewed and went over radiographic studies with the patient.  The results are noted within this dictation.    Bone density last month demonstrated normal bone density.  Dr. Rossie Muskrat note from 12/01/2015 is reviewed: ASSESSMENT: IgG kappa myeloma with high risk features, Stage II by R-ISS who has achieved a PR prior to transplant. Remains in PR (with questionable VGPR).   PLAN:   IgG kappa myeloma with high risk features (-17p and -13q), Stage II by R-ISS: Day + 100 PET/CT and bone marrow biopsy did not show any evidence of myeloma, however, M-Spike remained detectable at 0.35 and 0.28. Immunofixation monoclonal Kappa X 2. Myeloma markers are stable. Continue maintenance therapy with Velcade 1.3 mg/m2 every other week (Izazomib was too expensive per patient). Continue myeloma markers monthly.    Bisphosphonate therapy recommend restarting Zometa locally if has not already.    Prophylaxis: Discontinue Bactrim today. Continue antiviral therapy with Acyclovir 800 mg twice daily for the suppression of HSV and VZV for a total of 1 year. Decrease to 400 mg twice daily after one year anniversary while on  Velcade.  Immunization Schedule Post Transplant  6 Months 12/01/15 8 Months 10 Months 12 Months 30+ Months  HepB PCV13 DTaP HIB IPV HepB PCV13 DTaP HIB IPV PCV13 DTaP HIB IPV HepB PCV13 if cGVHD or PPSV23 if no GVHD  MMR   Influenza Annually 12/01/15   Follow-up: she will return to see [place] at the [facility] in 3 months for an assessment and re-immunization.    Health promotion: Continue exercise regimen with [employer] and walking in pool. We spoke about making dental and eye exam appointments. Asked her to talk to [name] about setting up a bone density scan. She has an upcoming mammogram. Additional health maintenance suggested per table below:   Dental exam Eye exam Bone density scan Colonoscopy Labs (vitamin D, iron panel, P61, folic acid)  Recommended frequency Twice a year Once a year Once every 2 years Variable Once a year  Last exam due due due 2008 03/2015  Next due due due due 2018 03/2016    She reports a dry cough at bedtime.  She denies any sputum production.  She notes that it keeps her up at night.  She denies any fevers or chills.  Rx strength medications are cost-prohibitive for the patient.  Tessalon cost her $18 in Feb 2017 and therefore she is looking for a more cost effective option.  She notes that it was effective.  Other option is OTC Delsym.  She complains of knee pain bilaterally.  She has tried Flexogenix without improvement.  She has seen Dr. Aline Brochure in 2015.  I have offered a  referral back to him, but she declines at this time.  Her knee pain is secondary to her obesity (likely).  She is due for Velcade and Zometa today.  Labs are adequate for both medications.  Return in 2 weeks for ongoing Velcade.  Return in 4 weeks for follow-up.  We will move her 1/31 appt here to 03/09/16 to accommodate the patient's appt with Dr. Debbrah Alar at [facility] ion 03/08/2016.

## 2016-02-09 NOTE — Patient Instructions (Addendum)
[place] at [facility] Discharge Instructions  RECOMMENDATIONS MADE BY THE CONSULTANT AND ANY TEST RESULTS WILL BE SENT TO YOUR REFERRING PHYSICIAN.  For cough, you can try Delsym.  This medication is over the counter.  It is usually in an orange bottle/box.  Zometa and Velcade today.   K-DUR escribed to your pharmacy  Return in 2 weeks for Velcade  [facility] appt on 03/08/16  More Velcade and Zometa for 1/31/ to 2/1  Return on 03/09/16 for follow up, Velcade, Zometa, and [name]  Thank you for choosing [place] at [facility] to provide your oncology and hematology care.  To afford each patient quality time with our provider, please arrive at least 15 minutes before your scheduled appointment time.    If you have a lab appointment with the [facility] please come in thru the  Main Entrance and check in at the main information desk  You need to re-schedule your appointment should you arrive 10 or more minutes late.  We strive to give you quality time with our providers, and arriving late affects you and other patients whose appointments are after yours.  Also, if you no show three or more times for appointments you may be dismissed from the clinic at the providers discretion.     Again, thank you for choosing [facility].  Our hope is that these requests will decrease the amount of time that you wait before being seen by our physicians.       _____________________________________________________________  Should you have questions after your visit to [facility], please contact our office at [phone] between the hours of 8:30 a.m. and 4:30 p.m.  Voicemails left after 4:30 p.m. will not be returned until the following business day.  For prescription refill requests, have your pharmacy contact our office.       Resources For Cancer Patients and their Caregivers ? American Cancer Society: Can assist with  transportation, wigs, general needs, runs Look Good Feel Better.        5180393699 ? Cancer Care: Provides financial assistance, online support groups, medication/co-pay assistance.  1-800-813-HOPE 9856473180) ? [facility] Assists [place] cancer patients and their families through emotional , educational and financial support.  (313)263-8868 ? [name] Where to apply for food stamps, Medicaid and utility assistance. 301 018 3739 ? [name]: Transportation to medical appointments. 669-809-7229 ? Social Security Administration: May apply for disability if have a Stage IV cancer. 818-670-4412 984-282-3044 ? [name], Disability and Transit Services: Assists with nutrition, care and transit needs. [place] Support Programs: @10RELATIVEDAYS @ > Cancer Support Group  2nd Tuesday of the month 1pm-2pm, Journey Room  > Creative Journey  3rd Tuesday of the month 1130am-1pm, Journey Room  > Look Good Feel Better  1st Wednesday of the month 10am-12 noon, Journey Room (Call [place] to register [phone])

## 2016-02-09 NOTE — Progress Notes (Signed)
[name], PA-C [address] [place] [address]  Multiple myeloma in remission ([facility])  Hypokalemia - Plan: potassium chloride SA (K-DUR,KLOR-CON) 20 MEQ tablet  CURRENT THERAPY: Velcade every 2 weeks and Zometa monthly 4 mg  INTERVAL HISTORY: Kelli Hurley 61 y.o. female returns for followup of multiple myeloma IgG kappa, Stage II, S/P bone marrow aspiration and biopsy showing 18% monoclonal plasma cells, kappa light chain restricted and cytogenetics demonstrating 13q-, 17p- (high risk disease).  Induction therapy beginning on 10/05/2014 with autologous transplant by Dr. Norma Fredrickson on 05/08/2015.  Now on maintenance therapy with Velcade.    Multiple myeloma ([place])   08/07/2014 Imaging    Bone Survey- No lytic lesions are noted in the visualized skeleton.      09/03/2014 Bone Marrow Biopsy    NORMOCELLULAR BONE MARROW FOR AGE WITH PLASMA CELL NEOPLASM.  The plasma cell component is increased in the marrow representing an estimated 18% of all cells. Cytogenetics with 13q-, 17p- (high risk disease)      09/03/2014 Pathology Results    Cytogenetics with 13q-, 17p- (high risk disease)      09/23/2014 Initial Diagnosis    Multiple myeloma      09/30/2014 PET scan    No abnormal hypermetabolism in the neck, chest, abdomen or pelvis.      10/05/2014 - 03/22/2015 Chemotherapy    RVD      10/28/2014 Treatment Plan Change    Issues related to getting Revlimid in a timely fashion, therefore, she received her Revlimid on 9/21 resulting in a 12 day cycle this time instead of a 14 day cycle      11/02/2014 Imaging    CTA chest- No evidence for a large or central pulmonary embolism as described.  8 mm density along the right minor fissure could represent focal pleural thickening but indeterminate. If the patient is at high risk for bronchogenic carcinoma, follow-up c      11/23/2014 Miscellaneous    Zometa 4 mg IV monthly      04/07/2015 Bone Marrow Biopsy   Normocellular marrow with 2-4% clonal plasma cells by immunohistochemistry. FISH and cytogenetics were normal [facility])        04/2015 Miscellaneous    PRETRANSPLANT EVALUATION:  Pulmonary function tests: FEV1 100.3% / DLCO 97.9%  Echocardiogram: Normal LV function with EF 60-65%       04/27/2015 Procedure    Stem cell mobilization with filgrastim and Mozobil [facility])      05/06/2015 Miscellaneous    BMT conditioning regimen with high-dose Melphalan given [facility] [name]); Day -1      05/07/2015 Bone Marrow Transplant    Outpatient autologous stem cell transplant [facility] [name]); Day 0      05/18/2015 Miscellaneous    WBC engraftment;  did not require platelet transfusion during her transplant process. Lowest platelet count 28,000       05/20/2015 Procedure    Tunneled catheter removed [facility])      09/08/2015 -  Chemotherapy    Velcade every 2 weeks      12/15/2015 Miscellaneous    Zometa re-instituted.       12/23/2015 Imaging    Bone density- BMD as determined from [facility] 33% is 0.799 g/cm2 with a T-Score of 1.2. This patient is considered normal according to [facility]) criteria.       She is tolerating treatment well.    She has follow-up with Dr.  Norma Fredrickson on 03/08/2016.  We will adjust our scheduled 03/08/16 appt accordingly.   She denies any peripheral neuropathy.  She is up to date on immunizations and these are being given at [facility].  She will be due for more immunizations on 1/31.  She continues with a non-productive cough.  She continues with chronic B/L knee pain.  Review of Systems  Constitutional: Negative.  Negative for chills, fever and weight loss.  HENT: Negative.   Eyes: Negative.   Respiratory: Positive for cough.   Cardiovascular: Negative.  Negative for chest pain.  Gastrointestinal: Negative.  Negative for constipation, diarrhea, nausea and vomiting.  Genitourinary: Negative.  Negative for dysuria.    Musculoskeletal: Positive for joint pain (B/L knees).  Skin: Negative.   Neurological: Negative.  Negative for weakness.  Endo/Heme/Allergies: Negative.   Psychiatric/Behavioral: Negative.     Past Medical History:  Diagnosis Date  . Anemia   . Claustrophobia 10/05/2014  . Hypertension   . Leukopenia 08/03/2014  . Normocytic hypochromic anemia 08/03/2014    Past Surgical History:  Procedure Laterality Date  . ABDOMINAL HYSTERECTOMY    . OTHER SURGICAL HISTORY     heart surgery as infant to "repair hole in heart"    Family History  Problem Relation Age of Onset  . Cancer Mother   . Hypertension Mother   . Cancer Father   . Hypertension Father   . Cancer Maternal Grandmother     Social History   Social History  . Marital status: Legally Separated    Spouse name: N/A  . Number of children: N/A  . Years of education: N/A   Social History Main Topics  . Smoking status: Never Smoker  . Smokeless tobacco: Never Used  . Alcohol use No  . Drug use: No  . Sexual activity: Not on file     Comment: divorced- 2 daughters   Other Topics Concern  . Not on file   Social History Narrative  . No narrative on file     PHYSICAL EXAMINATION  ECOG PERFORMANCE STATUS: 1 - Symptomatic but completely ambulatory  There were no vitals filed for this visit.  Vitals - 1 value per visit 04/08/231  SYSTOLIC 435  DIASTOLIC 85  Pulse 90  Temperature 98.9  Respirations 20  Weight (lb) 322.1    GENERAL:alert, no distress, well nourished, well developed, comfortable, cooperative, morbidly obese, smiling and unaccompanied SKIN: skin color, texture, turgor are normal, no rashes or significant lesions HEAD: Normocephalic, No masses, lesions, tenderness or abnormalities EYES: normal, EOMI, Conjunctiva are pink and non-injected EARS: External ears normal OROPHARYNX:lips, buccal mucosa, and tongue normal and mucous membranes are moist  NECK: supple, trachea midline LYMPH:  no palpable  lymphadenopathy BREAST:not examined LUNGS: clear to auscultation and percussion HEART: regular rate & rhythm, no murmurs, no gallops, S1 normal and S2 normal ABDOMEN:abdomen soft and normal bowel sounds BACK: Back symmetric, no curvature. EXTREMITIES:less then 2 second capillary refill, no joint deformities, effusion, or inflammation, no skin discoloration, no cyanosis  NEURO: alert & oriented x 3 with fluent speech, no focal motor/sensory deficits, gait normal   LABORATORY DATA: CBC    Component Value Date/Time   WBC 4.2 02/09/2016 0843   RBC 4.20 02/09/2016 0843   HGB 12.4 02/09/2016 0843   HCT 37.3 02/09/2016 0843   PLT 181 02/09/2016 0843   MCV 88.8 02/09/2016 0843   MCH 29.5 02/09/2016 0843   MCHC 33.2 02/09/2016 0843   RDW 14.0 02/09/2016 0843   LYMPHSABS 0.8  02/09/2016 0843   MONOABS 0.4 02/09/2016 0843   EOSABS 0.1 02/09/2016 0843   BASOSABS 0.0 02/09/2016 0843      Chemistry      Component Value Date/Time   NA 137 02/09/2016 0843   K 4.1 02/09/2016 0843   CL 101 02/09/2016 0843   CO2 28 02/09/2016 0843   BUN 18 02/09/2016 0843   CREATININE 0.94 02/09/2016 0843      Component Value Date/Time   CALCIUM 10.0 02/09/2016 0843   ALKPHOS 56 02/09/2016 0843   AST 19 02/09/2016 0843   ALT 14 02/09/2016 0843   BILITOT 0.4 02/09/2016 0843     Lab Results  Component Value Date   PROT 7.9 02/09/2016   ALBUMINELP 4.0 01/26/2016   A1GS 0.2 01/26/2016   A2GS 0.6 01/26/2016   BETS 0.9 01/26/2016   GAMS 1.3 01/26/2016   MSPIKE 0.3 (H) 01/26/2016   SPEI Comment 01/26/2016   SPECOM Comment 01/26/2016   IGGSERUM 1,293 01/26/2016   IGGSERUM 1,370 01/26/2016   IGA 80 (L) 01/26/2016   IGA 83 (L) 01/26/2016   IGMSERUM 53 01/26/2016   IGMSERUM 52 01/26/2016   KPAFRELGTCHN 17.5 01/26/2016   LAMBDASER 11.7 01/26/2016   KAPLAMBRATIO 1.50 01/26/2016    PENDING LABS:   RADIOGRAPHIC STUDIES:  No results found.   PATHOLOGY:    ASSESSMENT AND PLAN:  Multiple  myeloma IgG kappa multiple myeloma, Stage II, presenting with severe anemia.  Bone marrow aspiration and biopsy at time of diagnosis demonstrated 18% monoclonal plasma cells, kappa light chain restricted.  Cytogenetics demonstrated a 13q- and 17p- (high risk disease).  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 Debbrah Alar.  Oncology history is updated.  Labs today: CBC diff, CMET, Mg. I personally reviewed and went over laboratory results with the patient.  The results are noted within this dictation.    Labs every 2 weeks: CBC diff, CMET, Mg.  Additional labs in 2 weeks: SPEP+IFE, light chain assay, B2M, LDH.  After a year, we will decrease her Acyclovir from 800 mg to 400 mg twice a day.   I personally reviewed and went over radiographic studies with the patient.  The results are noted within this dictation.    Bone density last month demonstrated normal bone density.  Dr. Rossie Muskrat note from 12/01/2015 is reviewed: ASSESSMENT: IgG kappa myeloma with high risk features, Stage II by R-ISS who has achieved a PR prior to transplant. Remains in PR (with questionable VGPR).   PLAN:   IgG kappa myeloma with high risk features (-17p and -13q), Stage II by R-ISS: Day + 100 PET/CT and bone marrow biopsy did not show any evidence of myeloma, however, M-Spike remained detectable at 0.35 and 0.28. Immunofixation monoclonal Kappa X 2. Myeloma markers are stable. Continue maintenance therapy with Velcade 1.3 mg/m2 every other week (Izazomib was too expensive per patient). Continue myeloma markers monthly.    Bisphosphonate therapy recommend restarting Zometa locally if has not already.    Prophylaxis: Discontinue Bactrim today. Continue antiviral therapy with Acyclovir 800 mg twice daily for the suppression of HSV and VZV for a total of 1 year. Decrease to 400 mg twice daily after one year anniversary  while on Velcade.  Immunization Schedule Post Transplant  6 Months 12/01/15 8 Months 10 Months 12 Months 30+ Months  HepB PCV13 DTaP HIB IPV HepB PCV13 DTaP HIB IPV PCV13 DTaP HIB IPV HepB  PCV13 if cGVHD or PPSV23 if no GVHD  MMR   Influenza Annually 12/01/15   Follow-up: she will return to see [place] at the [facility] in 3 months for an assessment and re-immunization.    Health promotion: Continue exercise regimen with [employer] and walking in pool. We spoke about making dental and eye exam appointments. Asked her to talk to [name] about setting up a bone density scan. She has an upcoming mammogram. Additional health maintenance suggested per table below:   Dental exam Eye exam Bone density scan Colonoscopy Labs (vitamin D, iron panel, E36, folic acid)  Recommended frequency Twice a year Once a year Once every 2 years Variable Once a year  Last exam due due due 2008 03/2015  Next due due due due 2018 03/2016    She reports a dry cough at bedtime.  She denies any sputum production.  She notes that it keeps her up at night.  She denies any fevers or chills.  Rx strength medications are cost-prohibitive for the patient.  Tessalon cost her $18 in Feb 2017 and therefore she is looking for a more cost effective option.  She notes that it was effective.  Other option is OTC Delsym.  She complains of knee pain bilaterally.  She has tried Flexogenix without improvement.  She has seen Dr. Aline Brochure in 2015.  I have offered a referral back to him, but she declines at this time.  Her knee pain is secondary to her obesity (likely).  She is due for Velcade and Zometa today.  Labs are adequate for both medications.  Return in 2 weeks for ongoing Velcade.  Return in 4 weeks for follow-up.  We will move her 1/31 appt here to 03/09/16 to accommodate the patient's appt with Dr. Debbrah Alar at [facility] ion 03/08/2016.   ORDERS PLACED FOR THIS ENCOUNTER: No orders of the defined types were  placed in this encounter.   MEDICATIONS PRESCRIBED THIS ENCOUNTER: Meds ordered this encounter  Medications  . potassium chloride SA (K-DUR,KLOR-CON) 20 MEQ tablet    Sig: Take 1 tablet (20 mEq total) by mouth 2 (two) times daily.    Dispense:  60 tablet    Refill:  2    Order Specific Question:   Supervising Provider    Answer:   Patrici Ranks [id number]    THERAPY PLAN:  Continue Velcade every 2 weeks.  Continue Zometa every 4 weeks.  All questions were answered. The patient knows to call the clinic with any problems, questions or concerns. We can certainly see the patient much sooner if necessary.  Patient and plan discussed with Dr. Ancil Linsey and she is in agreement with the aforementioned.   This note is electronically signed by: Doy Mince 02/09/2016 10:22 AM

## 2016-02-09 NOTE — Progress Notes (Signed)
Kelli Hurley tolerated Velcade injection and Zometa infusion well without complaints or incident.Labs reviewed including Calcium and pt denied any tooth,jaw or leg pain prior to administering chemotherapy/Zometa.VSS upon discharge. Pt discharged self ambulatory in satisfactory condition

## 2016-02-14 ENCOUNTER — Other Ambulatory Visit (HOSPITAL_COMMUNITY): Payer: Self-pay | Admitting: Hematology & Oncology

## 2016-02-14 DIAGNOSIS — Z79899 Other long term (current) drug therapy: Secondary | ICD-10-CM

## 2016-02-14 DIAGNOSIS — C9000 Multiple myeloma not having achieved remission: Secondary | ICD-10-CM

## 2016-02-15 DIAGNOSIS — Z1231 Encounter for screening mammogram for malignant neoplasm of breast: Secondary | ICD-10-CM | POA: Diagnosis not present

## 2016-02-23 ENCOUNTER — Other Ambulatory Visit (HOSPITAL_COMMUNITY): Payer: Medicare Other

## 2016-02-28 ENCOUNTER — Encounter (HOSPITAL_COMMUNITY): Payer: Medicare Other

## 2016-02-28 DIAGNOSIS — C9000 Multiple myeloma not having achieved remission: Secondary | ICD-10-CM | POA: Diagnosis not present

## 2016-02-28 DIAGNOSIS — C9001 Multiple myeloma in remission: Secondary | ICD-10-CM

## 2016-02-28 LAB — LACTATE DEHYDROGENASE: LDH: 154 U/L (ref 98–192)

## 2016-02-28 LAB — COMPREHENSIVE METABOLIC PANEL
ALK PHOS: 44 U/L (ref 38–126)
ALT: 10 U/L — AB (ref 14–54)
ANION GAP: 6 (ref 5–15)
AST: 17 U/L (ref 15–41)
Albumin: 4.1 g/dL (ref 3.5–5.0)
BILIRUBIN TOTAL: 0.6 mg/dL (ref 0.3–1.2)
BUN: 17 mg/dL (ref 6–20)
CALCIUM: 9.6 mg/dL (ref 8.9–10.3)
CO2: 28 mmol/L (ref 22–32)
CREATININE: 0.86 mg/dL (ref 0.44–1.00)
Chloride: 103 mmol/L (ref 101–111)
GFR calc non Af Amer: >60 mL/min (ref >60)
Glucose, Bld: 94 mg/dL (ref 65–99)
Potassium: 4 mmol/L (ref 3.5–5.1)
SODIUM: 137 mmol/L (ref 135–145)
TOTAL PROTEIN: 7.6 g/dL (ref 6.5–8.1)

## 2016-02-28 LAB — CBC WITH DIFFERENTIAL/PLATELET
Basophils Absolute: 0 10*3/uL (ref 0.0–0.1)
Basophils Relative: 0 %
Eosinophils Absolute: 0.1 10*3/uL (ref 0.0–0.7)
Eosinophils Relative: 2 %
HEMATOCRIT: 34.4 % — AB (ref 36.0–46.0)
HEMOGLOBIN: 11.4 g/dL — AB (ref 12.0–15.0)
LYMPHS ABS: 1.7 10*3/uL (ref 0.7–4.0)
LYMPHS PCT: 30 %
MCH: 29.1 pg (ref 26.0–34.0)
MCHC: 33.1 g/dL (ref 30.0–36.0)
MCV: 87.8 fL (ref 78.0–100.0)
Monocytes Absolute: 0.4 10*3/uL (ref 0.1–1.0)
Monocytes Relative: 6 %
NEUTROS ABS: 3.6 10*3/uL (ref 1.7–7.7)
Neutrophils Relative %: 62 %
Platelets: 213 10*3/uL (ref 150–400)
RBC: 3.92 MIL/uL (ref 3.87–5.11)
RDW: 14.1 % (ref 11.5–15.5)
WBC: 5.9 10*3/uL (ref 4.0–10.5)

## 2016-02-28 LAB — MAGNESIUM: Magnesium: 1.8 mg/dL (ref 1.7–2.4)

## 2016-02-29 LAB — PROTEIN ELECTROPHORESIS, SERUM
A/G Ratio: 1.1 (ref 0.7–1.7)
ALBUMIN ELP: 3.8 g/dL (ref 2.9–4.4)
Alpha-1-Globulin: 0.3 g/dL (ref 0.0–0.4)
Alpha-2-Globulin: 0.7 g/dL (ref 0.4–1.0)
BETA GLOBULIN: 1.1 g/dL (ref 0.7–1.3)
Gamma Globulin: 1.5 g/dL (ref 0.4–1.8)
Globulin, Total: 3.5 g/dL (ref 2.2–3.9)
M-Spike, %: 0.3 g/dL — ABNORMAL HIGH
PDF SPE: 0
TOTAL PROTEIN ELP: 7.3 g/dL (ref 6.0–8.5)

## 2016-02-29 LAB — KAPPA/LAMBDA LIGHT CHAINS
KAPPA FREE LGHT CHN: 22.7 mg/L — AB (ref 3.3–19.4)
Kappa, lambda light chain ratio: 1.69 — ABNORMAL HIGH (ref 0.26–1.65)
Lambda free light chains: 13.4 mg/L (ref 5.7–26.3)

## 2016-02-29 LAB — BETA 2 MICROGLOBULIN, SERUM: BETA 2 MICROGLOBULIN: 2.5 mg/L — AB (ref 0.6–2.4)

## 2016-02-29 LAB — IGG, IGA, IGM
IgA: 128 mg/dL (ref 87–352)
IgG (Immunoglobin G), Serum: 1391 mg/dL (ref 700–1600)
IgM, Serum: 66 mg/dL (ref 26–217)

## 2016-03-01 LAB — IMMUNOFIXATION ELECTROPHORESIS
IGM, SERUM: 68 mg/dL (ref 26–217)
IgA: 124 mg/dL (ref 87–352)
IgG (Immunoglobin G), Serum: 1339 mg/dL (ref 700–1600)
TOTAL PROTEIN ELP: 7.2 g/dL (ref 6.0–8.5)

## 2016-03-08 ENCOUNTER — Other Ambulatory Visit (HOSPITAL_COMMUNITY): Payer: Medicare Other

## 2016-03-08 ENCOUNTER — Ambulatory Visit (HOSPITAL_COMMUNITY): Payer: Medicare Other | Admitting: Hematology & Oncology

## 2016-03-08 ENCOUNTER — Inpatient Hospital Stay (HOSPITAL_COMMUNITY): Payer: Medicare Other

## 2016-03-08 DIAGNOSIS — C9000 Multiple myeloma not having achieved remission: Secondary | ICD-10-CM | POA: Diagnosis not present

## 2016-03-08 DIAGNOSIS — C9001 Multiple myeloma in remission: Secondary | ICD-10-CM | POA: Diagnosis not present

## 2016-03-08 DIAGNOSIS — Z9484 Stem cells transplant status: Secondary | ICD-10-CM | POA: Diagnosis not present

## 2016-03-08 DIAGNOSIS — Z23 Encounter for immunization: Secondary | ICD-10-CM | POA: Diagnosis not present

## 2016-03-09 ENCOUNTER — Encounter (HOSPITAL_BASED_OUTPATIENT_CLINIC_OR_DEPARTMENT_OTHER): Payer: Medicare Other | Admitting: Adult Health

## 2016-03-09 ENCOUNTER — Other Ambulatory Visit (HOSPITAL_COMMUNITY): Payer: Medicare Other

## 2016-03-09 ENCOUNTER — Encounter (HOSPITAL_COMMUNITY): Payer: Self-pay | Admitting: Adult Health

## 2016-03-09 ENCOUNTER — Ambulatory Visit (HOSPITAL_COMMUNITY): Payer: Medicare Other | Admitting: Hematology & Oncology

## 2016-03-09 ENCOUNTER — Encounter (HOSPITAL_COMMUNITY): Payer: Medicare Other | Attending: Hematology & Oncology

## 2016-03-09 VITALS — BP 142/83 | HR 84 | Temp 98.2°F | Resp 18

## 2016-03-09 VITALS — BP 148/91 | HR 84 | Temp 98.0°F | Resp 20 | Wt 326.3 lb

## 2016-03-09 DIAGNOSIS — Z5112 Encounter for antineoplastic immunotherapy: Secondary | ICD-10-CM

## 2016-03-09 DIAGNOSIS — C9001 Multiple myeloma in remission: Secondary | ICD-10-CM | POA: Diagnosis not present

## 2016-03-09 DIAGNOSIS — C9000 Multiple myeloma not having achieved remission: Secondary | ICD-10-CM | POA: Insufficient documentation

## 2016-03-09 MED ORDER — BORTEZOMIB CHEMO SQ INJECTION 3.5 MG (2.5MG/ML)
1.3000 mg/m2 | Freq: Once | INTRAMUSCULAR | Status: AC
  Administered 2016-03-09: 3.25 mg via SUBCUTANEOUS
  Filled 2016-03-09: qty 3.25

## 2016-03-09 MED ORDER — SODIUM CHLORIDE 0.9 % IV SOLN
INTRAVENOUS | Status: DC
  Administered 2016-03-09: 11:00:00 via INTRAVENOUS

## 2016-03-09 MED ORDER — PROCHLORPERAZINE MALEATE 10 MG PO TABS
10.0000 mg | ORAL_TABLET | Freq: Once | ORAL | Status: AC
  Administered 2016-03-09: 10 mg via ORAL
  Filled 2016-03-09: qty 1

## 2016-03-09 MED ORDER — ZOLEDRONIC ACID 4 MG/5ML IV CONC: 4.0000 mg | Freq: Once | INTRAVENOUS | Status: DC

## 2016-03-09 MED ORDER — ZOLEDRONIC ACID 4 MG/100ML IV SOLN
4.0000 mg | Freq: Once | INTRAVENOUS | Status: AC
  Administered 2016-03-09: 4 mg via INTRAVENOUS
  Filled 2016-03-09: qty 100

## 2016-03-09 NOTE — Progress Notes (Signed)
Patient stated that she was told by [place] to stop taking Calcium and Vitamin D. Patient takes monthly Zometa.

## 2016-03-09 NOTE — Patient Instructions (Addendum)
[place] at [facility] Discharge Instructions  RECOMMENDATIONS MADE BY THE CONSULTANT AND ANY TEST RESULTS WILL BE SENT TO YOUR REFERRING PHYSICIAN.  Return in 1 month to see doctor Labs in 1 month Return in 2 weeks for Velcade and 1 month for Zometa  Thank you for choosing [place] at [facility] to provide your oncology and hematology care.  To afford each patient quality time with our provider, please arrive at least 15 minutes before your scheduled appointment time.    If you have a lab appointment with the [facility] please come in thru the  Main Entrance and check in at the main information desk  You need to re-schedule your appointment should you arrive 10 or more minutes late.  We strive to give you quality time with our providers, and arriving late affects you and other patients whose appointments are after yours.  Also, if you no show three or more times for appointments you may be dismissed from the clinic at the providers discretion.     Again, thank you for choosing [facility].  Our hope is that these requests will decrease the amount of time that you wait before being seen by our physicians.       _____________________________________________________________  Should you have questions after your visit to [facility], please contact our office at [phone] between the hours of 8:30 a.m. and 4:30 p.m.  Voicemails left after 4:30 p.m. will not be returned until the following business day.  For prescription refill requests, have your pharmacy contact our office.       Resources For Cancer Patients and their Caregivers ? American Cancer Society: Can assist with transportation, wigs, general needs, runs Look Good Feel Better.        6516822051 ? Cancer Care: Provides financial assistance, online support groups, medication/co-pay assistance.  1-800-813-HOPE 515-149-7892) ? [place] Assists [place] cancer patients and their families through emotional , educational and financial support.  3092824156 ? [name] Where to apply for food stamps, Medicaid and utility assistance. 951 230 0767 ? [name]: Transportation to medical appointments. 208-458-5687 ? Social Security Administration: May apply for disability if have a Stage IV cancer. 541-544-5804 (216) 381-0018 ? [name], Disability and Transit Services: Assists with nutrition, care and transit needs. [place] Support Programs: @10RELATIVEDAYS @ > Cancer Support Group  2nd Tuesday of the month 1pm-2pm, Journey Room  > Creative Journey  3rd Tuesday of the month 1130am-1pm, Journey Room  > Look Good Feel Better  1st Wednesday of the month 10am-12 noon, Journey Room (Call [place] to register [phone])

## 2016-03-09 NOTE — Pre-Procedure Instructions (Signed)
Labs at [facility] 03/08/16

## 2016-03-09 NOTE — Progress Notes (Signed)
Kelli Hurley tolerated Velcade injection and Zometa infusion well without complaints or incident. Labs from [place] reviewed and pt denied any tooth,jaw or leg pain prior to administering medications. VSS upon discharge. Pt discharged self ambulatory in satisfactory condition

## 2016-03-09 NOTE — Patient Instructions (Signed)
[facility] Discharge Instructions for Patients Receiving Chemotherapy   Beginning January 23rd 2017 lab work for the Cancer Center will be done in the  Main lab at [place] on 1st floor. If you have a lab appointment with the Cancer Center please come in thru the  Main Entrance and check in at the main information desk   Today you received the following chemotherapy agents Velcade injection as well as Zometa infusion. Follow-up as scheduled. Call clinic for any questions or concerns  To help prevent nausea and vomiting after your treatment, we encourage you to take your nausea medication   If you develop nausea and vomiting, or diarrhea that is not controlled by your medication, call the clinic.  The clinic phone number is [phone]. Office hours are Monday-Friday 8:30am-5:00pm.  BELOW ARE SYMPTOMS THAT SHOULD BE REPORTED IMMEDIATELY:  *FEVER GREATER THAN 101.0 F  *CHILLS WITH OR WITHOUT FEVER  NAUSEA AND VOMITING THAT IS NOT CONTROLLED WITH YOUR NAUSEA MEDICATION  *UNUSUAL SHORTNESS OF BREATH  *UNUSUAL BRUISING OR BLEEDING  TENDERNESS IN MOUTH AND THROAT WITH OR WITHOUT PRESENCE OF ULCERS  *URINARY PROBLEMS  *BOWEL PROBLEMS  UNUSUAL RASH Items with * indicate a potential emergency and should be followed up as soon as possible. If you have an emergency after office hours please contact your primary care physician or go to the nearest emergency department.  Please call the clinic during office hours if you have any questions or concerns.   You may also contact the Patient Navigator at [phone] should you have any questions or need assistance in obtaining follow up care.      Resources For Cancer Patients and their Caregivers ? American Cancer Society: Can assist with transportation, wigs, general needs, runs Look Good Feel Better.        1-888-227-6333 ? Cancer Care: Provides financial assistance, online support groups, medication/co-pay  assistance.  1-800-813-HOPE (4673) ? [facility] Assists [place] cancer patients and their families through emotional , educational and financial support.  336-427-4357 ? [name] Where to apply for food stamps, Medicaid and utility assistance. 336-342-1394 ? [name]: Transportation to medical appointments. 336-347-2287 ? Social Security Administration: May apply for disability if have a Stage IV cancer. 336-342-7796 1-800-772-1213 ? [place] Aging, Disability and Transit Services: Assists with nutrition, care and transit needs. 336-349-2343         

## 2016-03-09 NOTE — Progress Notes (Addendum)
[place], [place] [address]   CLINIC:  Medical Oncology/Hematology  PCP:  Mackey Birchwood [address] [place] [address] [phone]   REASON FOR VISIT:  Follow-up for h/o Multiple Myeloma s/p stem cell transplant on 05/07/15  CURRENT THERAPY: Maintenance Velcade every 2 weeks & Zometa monthly  BRIEF ONCOLOGIC HISTORY:    Multiple myeloma ([place])   08/07/2014 Imaging    Bone Survey- No lytic lesions are noted in the visualized skeleton.      09/03/2014 Bone Marrow Biopsy    NORMOCELLULAR BONE MARROW FOR AGE WITH PLASMA CELL NEOPLASM.  The plasma cell component is increased in the marrow representing an estimated 18% of all cells. Cytogenetics with 13q-, 17p- (high risk disease)      09/03/2014 Pathology Results    Cytogenetics with 13q-, 17p- (high risk disease)      09/23/2014 Initial Diagnosis    Multiple myeloma      09/30/2014 PET scan    No abnormal hypermetabolism in the neck, chest, abdomen or pelvis.      10/05/2014 - 03/22/2015 Chemotherapy    RVD      10/28/2014 Treatment Plan Change    Issues related to getting Revlimid in a timely fashion, therefore, she received her Revlimid on 9/21 resulting in a 12 day cycle this time instead of a 14 day cycle      11/02/2014 Imaging    CTA chest- No evidence for a large or central pulmonary embolism as described.  8 mm density along the right minor fissure could represent focal pleural thickening but indeterminate. If the patient is at high risk for bronchogenic carcinoma, follow-up c      11/23/2014 Miscellaneous    Zometa 4 mg IV monthly      04/07/2015 Bone Marrow Biopsy    Normocellular marrow with 2-4% clonal plasma cells by immunohistochemistry. FISH and cytogenetics were normal [facility])        04/2015 Miscellaneous    PRETRANSPLANT EVALUATION:  Pulmonary function tests: FEV1 100.3% / DLCO 97.9%  Echocardiogram: Normal LV function with EF 60-65%       04/27/2015 Procedure    Stem cell mobilization with filgrastim and Mozobil [facility])      05/06/2015 Miscellaneous    BMT conditioning regimen with high-dose Melphalan given [facility], [name]); Day -1      05/07/2015 Bone Marrow Transplant    Outpatient autologous stem cell transplant [facility], [name]); Day 0      05/18/2015 Miscellaneous    WBC engraftment;  did not require platelet transfusion during her transplant process. Lowest platelet count 28,000       05/20/2015 Procedure    Tunneled catheter removed [facility])      09/08/2015 -  Chemotherapy    Velcade every 2 weeks      12/15/2015 Miscellaneous    Zometa re-instituted.       12/23/2015 Imaging    Bone density- BMD as determined from [facility] 33% is 0.799 g/cm2 with a T-Score of 1.2. This patient is considered normal according to [facility]) criteria.        INTERVAL HISTORY:  Kelli Hurley presents to the cancer center unaccompanied today for routine follow-up and for her next doses of Velcade and Zometa.  She reports feeling very well. Her appetite is great. Her energy levels are excellent as well. She denies any new bone pain or arthralgias. Denies any fever or chills. Denies any  frank bleeding in her stools, melena, dysuria, or hematuria. Denies any dizziness or headaches.  She recently saw her stem cell transplant team at [facility] yesterday. They tell her that she is continuing to do very well. She remains on acyclovir antiviral prophylaxis.  She is largely with no complaints today.   REVIEW OF SYSTEMS:  Review of Systems  Constitutional: Negative.  Negative for appetite change, chills, fatigue and fever.  HENT:  Negative.  Negative for lump/mass and sore throat.   Eyes: Negative.   Respiratory: Negative.  Negative for cough and shortness of breath.   Cardiovascular: Negative.  Negative for chest pain and palpitations.  Gastrointestinal:  Negative.  Negative for abdominal pain, blood in stool, constipation, diarrhea, nausea and vomiting.  Endocrine: Negative.   Genitourinary: Negative.  Negative for dysuria and hematuria.   Musculoskeletal: Negative.  Negative for arthralgias.  Skin: Negative.   Neurological: Negative.  Negative for dizziness and headaches.  Hematological: Negative.  Negative for adenopathy. Does not bruise/bleed easily.  Psychiatric/Behavioral: Negative.      PAST MEDICAL/SURGICAL HISTORY:  Past Medical History:  Diagnosis Date  . Anemia   . Claustrophobia 10/05/2014  . Hypertension   . Leukopenia 08/03/2014  . Normocytic hypochromic anemia 08/03/2014   Past Surgical History:  Procedure Laterality Date  . ABDOMINAL HYSTERECTOMY    . OTHER SURGICAL HISTORY     heart surgery as infant to "repair hole in heart"     SOCIAL HISTORY:  Social History   Social History  . Marital status: Legally Separated    Spouse name: N/A  . Number of children: N/A  . Years of education: N/A   Occupational History  . Not on file.   Social History Main Topics  . Smoking status: Never Smoker  . Smokeless tobacco: Never Used  . Alcohol use No  . Drug use: No  . Sexual activity: Not on file     Comment: divorced- 2 daughters   Other Topics Concern  . Not on file   Social History Narrative  . No narrative on file    FAMILY HISTORY:  Family History  Problem Relation Age of Onset  . Cancer Mother   . Hypertension Mother   . Cancer Father   . Hypertension Father   . Cancer Maternal Grandmother     CURRENT MEDICATIONS:  Outpatient Encounter Prescriptions as of 03/09/2016  Medication Sig  . acyclovir (ZOVIRAX) 800 MG tablet TAKE 1 TABLET BY MOUTH TWICE DAILY  . Bortezomib (VELCADE IJ) Inject as directed every 14 (fourteen) days.  . hydrochlorothiazide (HYDRODIURIL) 25 MG tablet Take 1 tablet (25 mg total) by mouth every morning.  . Multiple Vitamin (TAB-A-VITE) TABS TAKE 1 TABLET BY MOUTH ONCE DAILY.   Marland Kitchen potassium chloride SA (K-DUR,KLOR-CON) 20 MEQ tablet Take 1 tablet (20 mEq total) by mouth 2 (two) times daily.  Marland Kitchen PROAIR HFA 108 (90 Base) MCG/ACT inhaler Inhale 2 puffs into the lungs daily as needed.   . zolendronic acid (ZOMETA) 4 MG/5ML injection Inject 4 mg into the vein every 28 (twenty-eight) days.   Facility-Administered Encounter Medications as of 03/09/2016  Medication  . heparin lock flush 100 unit/mL  . sodium chloride 0.9 % injection 10 mL    ALLERGIES:  No Known Allergies   PHYSICAL EXAM:  ECOG Performance status: 0  Physical Exam  Constitutional: She is oriented to person, place, and time and well-developed, well-nourished, and in no distress.  HENT:  Head: Normocephalic.  Mouth/Throat: Oropharynx  is clear and moist. No oropharyngeal exudate.  Eyes: Conjunctivae are normal. Pupils are equal, round, and reactive to light. No scleral icterus.  Neck: Normal range of motion. Neck supple.  Cardiovascular: Normal rate, regular rhythm and normal heart sounds.   Pulmonary/Chest: Effort normal and breath sounds normal. No respiratory distress.  Abdominal: Soft. Bowel sounds are normal. There is no tenderness.  Musculoskeletal: Normal range of motion. She exhibits no edema.  Lymphadenopathy:    She has no cervical adenopathy.  Neurological: She is alert and oriented to person, place, and time. No cranial nerve deficit. Gait normal.  Skin: Skin is warm and dry.  Psychiatric: Mood, memory, affect and judgment normal.     LABORATORY DATA:  I have reviewed the labs as listed.        PENDING LABS:    DIAGNOSTIC IMAGING:  DEXA scan: 12/23/15   PATHOLOGY:     ASSESSMENT & PLAN:  Ms. Kassa is a very pleasant 61 y.o. 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  She is currently on maintenance Velcade every 2 weeks, which was  restarted after transplant on 09/08/15, with plans to continue to a total of 2 years. She is also receiving Zometa monthly.    Multiple myeloma in remission:  -She is here for continued maintenance twice monthly Velcade and monthly Zometa.  Her labs were reviewed from her recent visit to Dr. [name]' office and they are largely within normal limits and stable.  She will receive scheduled doses of Velcade and Zometa today.  She will return to cancer center in 2 weeks for next dose of Velcade. We will see her for a follow-up appt in 4 weeks, before her subsequent Velcade and Zometa doses.  Maintain current plan of care. She will follow-up with transplant team at [facility] in 05/2016 as scheduled.    Recent visit note with Dr. Reola Calkins' NP at [facility] reviewed via CareEverywhere:       Dispo:  -[place] today as scheduled. Return for Velcade every 2 weeks.  -Return to cancer center in 4 weeks for follow-up visit, subsequent Velcade & Zometa doses.    All questions were answered to patient's stated satisfaction. Encouraged patient to call with any new concerns or questions before her next visit to the cancer center and we can certain see her sooner, if needed.      Mike Craze, NP [place] [phone]

## 2016-03-22 ENCOUNTER — Other Ambulatory Visit (HOSPITAL_COMMUNITY): Payer: Self-pay | Admitting: Hematology & Oncology

## 2016-03-22 DIAGNOSIS — C9000 Multiple myeloma not having achieved remission: Secondary | ICD-10-CM

## 2016-03-22 DIAGNOSIS — Z79899 Other long term (current) drug therapy: Secondary | ICD-10-CM

## 2016-03-23 ENCOUNTER — Encounter (HOSPITAL_COMMUNITY): Payer: Medicare Other

## 2016-03-23 ENCOUNTER — Encounter (HOSPITAL_COMMUNITY): Payer: Self-pay

## 2016-03-23 ENCOUNTER — Encounter (HOSPITAL_BASED_OUTPATIENT_CLINIC_OR_DEPARTMENT_OTHER): Payer: Medicare Other

## 2016-03-23 VITALS — BP 146/74 | HR 89 | Temp 98.2°F | Resp 18

## 2016-03-23 DIAGNOSIS — C9000 Multiple myeloma not having achieved remission: Secondary | ICD-10-CM

## 2016-03-23 DIAGNOSIS — Z5112 Encounter for antineoplastic immunotherapy: Secondary | ICD-10-CM | POA: Diagnosis present

## 2016-03-23 LAB — CBC WITH DIFFERENTIAL/PLATELET
BASOS PCT: 0 %
Basophils Absolute: 0 10*3/uL (ref 0.0–0.1)
Eosinophils Absolute: 0.1 10*3/uL (ref 0.0–0.7)
Eosinophils Relative: 2 %
HCT: 36.6 % (ref 36.0–46.0)
HEMOGLOBIN: 12.3 g/dL (ref 12.0–15.0)
LYMPHS ABS: 1.8 10*3/uL (ref 0.7–4.0)
Lymphocytes Relative: 31 %
MCH: 29.6 pg (ref 26.0–34.0)
MCHC: 33.6 g/dL (ref 30.0–36.0)
MCV: 88 fL (ref 78.0–100.0)
Monocytes Absolute: 0.4 10*3/uL (ref 0.1–1.0)
Monocytes Relative: 7 %
NEUTROS PCT: 60 %
Neutro Abs: 3.5 10*3/uL (ref 1.7–7.7)
Platelets: 223 10*3/uL (ref 150–400)
RBC: 4.16 MIL/uL (ref 3.87–5.11)
RDW: 14 % (ref 11.5–15.5)
WBC: 5.9 10*3/uL (ref 4.0–10.5)

## 2016-03-23 LAB — MAGNESIUM: Magnesium: 1.8 mg/dL (ref 1.7–2.4)

## 2016-03-23 LAB — COMPREHENSIVE METABOLIC PANEL
ALBUMIN: 4.3 g/dL (ref 3.5–5.0)
ALK PHOS: 52 U/L (ref 38–126)
ALT: 13 U/L — AB (ref 14–54)
AST: 19 U/L (ref 15–41)
Anion gap: 8 (ref 5–15)
BUN: 16 mg/dL (ref 6–20)
CALCIUM: 9.7 mg/dL (ref 8.9–10.3)
CO2: 27 mmol/L (ref 22–32)
CREATININE: 0.85 mg/dL (ref 0.44–1.00)
Chloride: 104 mmol/L (ref 101–111)
GFR calc Af Amer: >60 mL/min (ref >60)
GFR calc non Af Amer: >60 mL/min (ref >60)
GLUCOSE: 85 mg/dL (ref 65–99)
Potassium: 4.2 mmol/L (ref 3.5–5.1)
SODIUM: 139 mmol/L (ref 135–145)
Total Bilirubin: 0.5 mg/dL (ref 0.3–1.2)
Total Protein: 7.9 g/dL (ref 6.5–8.1)

## 2016-03-23 MED ORDER — BORTEZOMIB CHEMO SQ INJECTION 3.5 MG (2.5MG/ML)
1.3000 mg/m2 | Freq: Once | INTRAMUSCULAR | Status: AC
  Administered 2016-03-23: 3.25 mg via SUBCUTANEOUS
  Filled 2016-03-23: qty 3.25

## 2016-03-23 MED ORDER — PROCHLORPERAZINE MALEATE 10 MG PO TABS
10.0000 mg | ORAL_TABLET | Freq: Once | ORAL | Status: AC
  Administered 2016-03-23: 10 mg via ORAL
  Filled 2016-03-23: qty 1

## 2016-03-23 NOTE — Progress Notes (Signed)
velcade given today. Patient tolerated it well. Vitals stable and discharged from clinic ambulatory.follow up as scheduled.

## 2016-03-30 ENCOUNTER — Encounter (HOSPITAL_COMMUNITY): Payer: Medicare Other

## 2016-03-30 DIAGNOSIS — C9000 Multiple myeloma not having achieved remission: Secondary | ICD-10-CM | POA: Diagnosis not present

## 2016-03-30 DIAGNOSIS — C9001 Multiple myeloma in remission: Secondary | ICD-10-CM

## 2016-03-30 LAB — COMPREHENSIVE METABOLIC PANEL
ALT: 11 U/L — ABNORMAL LOW (ref 14–54)
ANION GAP: 7 (ref 5–15)
AST: 17 U/L (ref 15–41)
Albumin: 4.3 g/dL (ref 3.5–5.0)
Alkaline Phosphatase: 46 U/L (ref 38–126)
BILIRUBIN TOTAL: 0.4 mg/dL (ref 0.3–1.2)
BUN: 20 mg/dL (ref 6–20)
CO2: 27 mmol/L (ref 22–32)
Calcium: 9.6 mg/dL (ref 8.9–10.3)
Chloride: 104 mmol/L (ref 101–111)
Creatinine, Ser: 0.88 mg/dL (ref 0.44–1.00)
GFR calc Af Amer: >60 mL/min (ref >60)
Glucose, Bld: 95 mg/dL (ref 65–99)
POTASSIUM: 4.2 mmol/L (ref 3.5–5.1)
Sodium: 138 mmol/L (ref 135–145)
TOTAL PROTEIN: 7.8 g/dL (ref 6.5–8.1)

## 2016-03-30 LAB — CBC WITH DIFFERENTIAL/PLATELET
Basophils Absolute: 0 10*3/uL (ref 0.0–0.1)
Basophils Relative: 0 %
Eosinophils Absolute: 0.2 10*3/uL (ref 0.0–0.7)
Eosinophils Relative: 3 %
HEMATOCRIT: 35.6 % — AB (ref 36.0–46.0)
Hemoglobin: 11.9 g/dL — ABNORMAL LOW (ref 12.0–15.0)
LYMPHS PCT: 35 %
Lymphs Abs: 1.9 10*3/uL (ref 0.7–4.0)
MCH: 29.4 pg (ref 26.0–34.0)
MCHC: 33.4 g/dL (ref 30.0–36.0)
MCV: 87.9 fL (ref 78.0–100.0)
MONO ABS: 0.4 10*3/uL (ref 0.1–1.0)
MONOS PCT: 8 %
NEUTROS ABS: 2.8 10*3/uL (ref 1.7–7.7)
Neutrophils Relative %: 53 %
Platelets: 200 10*3/uL (ref 150–400)
RBC: 4.05 MIL/uL (ref 3.87–5.11)
RDW: 14.1 % (ref 11.5–15.5)
WBC: 5.4 10*3/uL (ref 4.0–10.5)

## 2016-03-30 LAB — MAGNESIUM: MAGNESIUM: 1.9 mg/dL (ref 1.7–2.4)

## 2016-03-30 LAB — LACTATE DEHYDROGENASE: LDH: 156 U/L (ref 98–192)

## 2016-03-31 LAB — IMMUNOFIXATION ELECTROPHORESIS
IGA: 138 mg/dL (ref 87–352)
IGM, SERUM: 60 mg/dL (ref 26–217)
IgG (Immunoglobin G), Serum: 1371 mg/dL (ref 700–1600)
TOTAL PROTEIN ELP: 7.5 g/dL (ref 6.0–8.5)

## 2016-03-31 LAB — IGG, IGA, IGM
IGM, SERUM: 56 mg/dL (ref 26–217)
IgA: 136 mg/dL (ref 87–352)
IgG (Immunoglobin G), Serum: 1413 mg/dL (ref 700–1600)

## 2016-03-31 LAB — KAPPA/LAMBDA LIGHT CHAINS
KAPPA FREE LGHT CHN: 24.8 mg/L — AB (ref 3.3–19.4)
KAPPA, LAMDA LIGHT CHAIN RATIO: 1.61 (ref 0.26–1.65)
Lambda free light chains: 15.4 mg/L (ref 5.7–26.3)

## 2016-03-31 LAB — BETA 2 MICROGLOBULIN, SERUM: Beta-2 Microglobulin: 1.8 mg/L (ref 0.6–2.4)

## 2016-04-03 LAB — PROTEIN ELECTROPHORESIS, SERUM
A/G RATIO SPE: 1.1 (ref 0.7–1.7)
ALBUMIN ELP: 4 g/dL (ref 2.9–4.4)
ALPHA-2-GLOBULIN: 0.7 g/dL (ref 0.4–1.0)
Alpha-1-Globulin: 0.2 g/dL (ref 0.0–0.4)
Beta Globulin: 1.1 g/dL (ref 0.7–1.3)
GLOBULIN, TOTAL: 3.5 g/dL (ref 2.2–3.9)
Gamma Globulin: 1.4 g/dL (ref 0.4–1.8)
M-Spike, %: 0.4 g/dL — ABNORMAL HIGH
Total Protein ELP: 7.5 g/dL (ref 6.0–8.5)

## 2016-04-06 ENCOUNTER — Other Ambulatory Visit (HOSPITAL_COMMUNITY): Payer: Medicare Other

## 2016-04-06 ENCOUNTER — Other Ambulatory Visit (HOSPITAL_COMMUNITY): Payer: Self-pay | Admitting: Oncology

## 2016-04-06 ENCOUNTER — Encounter (HOSPITAL_COMMUNITY): Payer: Medicare Other | Attending: Hematology & Oncology | Admitting: Adult Health

## 2016-04-06 ENCOUNTER — Encounter (HOSPITAL_COMMUNITY): Payer: Self-pay | Admitting: Adult Health

## 2016-04-06 ENCOUNTER — Encounter (HOSPITAL_BASED_OUTPATIENT_CLINIC_OR_DEPARTMENT_OTHER): Payer: Medicare Other

## 2016-04-06 VITALS — BP 160/81 | HR 82 | Temp 98.8°F | Resp 18 | Ht 62.0 in | Wt 321.0 lb

## 2016-04-06 DIAGNOSIS — C9000 Multiple myeloma not having achieved remission: Secondary | ICD-10-CM

## 2016-04-06 DIAGNOSIS — Z5112 Encounter for antineoplastic immunotherapy: Secondary | ICD-10-CM

## 2016-04-06 DIAGNOSIS — C9001 Multiple myeloma in remission: Secondary | ICD-10-CM

## 2016-04-06 MED ORDER — ZOLEDRONIC ACID 4 MG/100ML IV SOLN
4.0000 mg | Freq: Once | INTRAVENOUS | Status: AC
  Administered 2016-04-06: 4 mg via INTRAVENOUS
  Filled 2016-04-06: qty 100

## 2016-04-06 MED ORDER — SODIUM CHLORIDE 0.9 % IV SOLN
INTRAVENOUS | Status: DC
  Administered 2016-04-06: 11:00:00 via INTRAVENOUS

## 2016-04-06 MED ORDER — IXAZOMIB CITRATE 3 MG PO CAPS: 3.0000 mg | ORAL_CAPSULE | ORAL | 0 refills | Status: DC

## 2016-04-06 MED ORDER — BORTEZOMIB CHEMO SQ INJECTION 3.5 MG (2.5MG/ML)
1.3000 mg/m2 | Freq: Once | INTRAMUSCULAR | Status: AC
  Administered 2016-04-06: 3.25 mg via SUBCUTANEOUS
  Filled 2016-04-06: qty 3.25

## 2016-04-06 MED ORDER — PROCHLORPERAZINE MALEATE 10 MG PO TABS
10.0000 mg | ORAL_TABLET | Freq: Once | ORAL | Status: AC
  Administered 2016-04-06: 10 mg via ORAL
  Filled 2016-04-06: qty 1

## 2016-04-06 NOTE — Patient Instructions (Addendum)
[place] at [facility] Discharge Instructions  RECOMMENDATIONS MADE BY THE CONSULTANT AND ANY TEST RESULTS WILL BE SENT TO YOUR REFERRING PHYSICIAN.  You were seen today by Mike Craze NP. Return next week for treatment.   Thank you for choosing [place] at [facility] to provide your oncology and hematology care.  To afford each patient quality time with our provider, please arrive at least 15 minutes before your scheduled appointment time.    If you have a lab appointment with the [facility] please come in thru the  Main Entrance and check in at the main information desk  You need to re-schedule your appointment should you arrive 10 or more minutes late.  We strive to give you quality time with our providers, and arriving late affects you and other patients whose appointments are after yours.  Also, if you no show three or more times for appointments you may be dismissed from the clinic at the providers discretion.     Again, thank you for choosing [facility].  Our hope is that these requests will decrease the amount of time that you wait before being seen by our physicians.       _____________________________________________________________  Should you have questions after your visit to [facility], please contact our office at [phone] between the hours of 8:30 a.m. and 4:30 p.m.  Voicemails left after 4:30 p.m. will not be returned until the following business day.  For prescription refill requests, have your pharmacy contact our office.       Resources For Cancer Patients and their Caregivers ? American Cancer Society: Can assist with transportation, wigs, general needs, runs Look Good Feel Better.        616-845-9599 ? Cancer Care: Provides financial assistance, online support groups, medication/co-pay assistance.  1-800-813-HOPE 816-465-3520) ? [place] Assists  [place] cancer patients and their families through emotional , educational and financial support.  224-117-8940 ? [name] Where to apply for food stamps, Medicaid and utility assistance. 306-839-3684 ? [name]: Transportation to medical appointments. 424-875-9937 ? Social Security Administration: May apply for disability if have a Stage IV cancer. 725-364-5259 201-684-0858 ? [name], Disability and Transit Services: Assists with nutrition, care and transit needs. [place] Support Programs: @10RELATIVEDAYS @ > Cancer Support Group  2nd Tuesday of the month 1pm-2pm, Journey Room  > Creative Journey  3rd Tuesday of the month 1130am-1pm, Journey Room  > Look Good Feel Better  1st Wednesday of the month 10am-12 noon, Journey Room (Call [place] to register [phone])

## 2016-04-06 NOTE — Patient Instructions (Signed)
[place] at [facility] Discharge Instructions  RECOMMENDATIONS MADE BY THE CONSULTANT AND ANY TEST RESULTS WILL BE SENT TO YOUR REFERRING PHYSICIAN.  Velcade injection given as ordered. Zometa infusion given as ordered.  Return as scheduled.  Thank you for choosing [place] at [facility] to provide your oncology and hematology care.  To afford each patient quality time with our provider, please arrive at least 15 minutes before your scheduled appointment time.    If you have a lab appointment with the [facility] please come in thru the  Main Entrance and check in at the main information desk  You need to re-schedule your appointment should you arrive 10 or more minutes late.  We strive to give you quality time with our providers, and arriving late affects you and other patients whose appointments are after yours.  Also, if you no show three or more times for appointments you may be dismissed from the clinic at the providers discretion.     Again, thank you for choosing [facility].  Our hope is that these requests will decrease the amount of time that you wait before being seen by our physicians.       _____________________________________________________________  Should you have questions after your visit to [facility], please contact our office at [phone] between the hours of 8:30 a.m. and 4:30 p.m.  Voicemails left after 4:30 p.m. will not be returned until the following business day.  For prescription refill requests, have your pharmacy contact our office.       Resources For Cancer Patients and their Caregivers ? American Cancer Society: Can assist with transportation, wigs, general needs, runs Look Good Feel Better.        (903) 786-2437 ? Cancer Care: Provides financial assistance, online support groups, medication/co-pay assistance.  1-800-813-HOPE 239-721-9994) ? [place] Assists [place] cancer patients and their families through emotional , educational and financial support.  719-079-0523 ? [name] Where to apply for food stamps, Medicaid and utility assistance. 657-244-3991 ? [name]: Transportation to medical appointments. (971)650-2149 ? Social Security Administration: May apply for disability if have a Stage IV cancer. ([phone] 426-0883 818-577-9147 ? [name], Disability and Transit Services: Assists with nutrition, care and transit needs. [place] Support Programs: @10RELATIVEDAYS @ > Cancer Support Group  2nd Tuesday of the month 1pm-2pm, Journey Room  > Creative Journey  3rd Tuesday of the month 1130am-1pm, Journey Room  > Look Good Feel Better  1st Wednesday of the month 10am-12 noon, Journey Room (Call [place] to register [phone])

## 2016-04-06 NOTE — Progress Notes (Signed)
Per G.Dawson,NP lab work not needed today. Velcade injection given as ordered. Tolerated zometa infusion well. Stable and ambulatory on discharge home to self.

## 2016-04-06 NOTE — Progress Notes (Signed)
Kelli Hurley, Kelli Hurley   CLINIC:  Medical Oncology/Hematology  PCP:  Kelli Hurley [address] [place] [address] [phone]   REASON FOR VISIT:  Follow-up for h/o Multiple Myeloma s/p stem cell transplant on 05/07/15  CURRENT THERAPY: Maintenance Velcade every 2 weeks & Zometa monthly   BRIEF ONCOLOGIC HISTORY:    Multiple myeloma ([place])   08/07/2014 Imaging    Bone Survey- No lytic lesions are noted in the visualized skeleton.      09/03/2014 Bone Marrow Biopsy    NORMOCELLULAR BONE MARROW FOR AGE WITH PLASMA CELL NEOPLASM.  The plasma cell component is increased in the marrow representing an estimated 18% of all cells. Cytogenetics with 13q-, 17p- (high risk disease)      09/03/2014 Pathology Results    Cytogenetics with 13q-, 17p- (high risk disease)      09/23/2014 Initial Diagnosis    Multiple myeloma      09/30/2014 PET scan    No abnormal hypermetabolism in the neck, chest, abdomen or pelvis.      10/05/2014 - 03/22/2015 Chemotherapy    RVD      10/28/2014 Treatment Plan Change    Issues related to getting Revlimid in a timely fashion, therefore, she received her Revlimid on 9/21 resulting in a 12 day cycle this time instead of a 14 day cycle      11/02/2014 Imaging    CTA chest- No evidence for a large or central pulmonary embolism as described.  8 mm density along the right minor fissure could represent focal pleural thickening but indeterminate. If the patient is at high risk for bronchogenic carcinoma, follow-up c      11/23/2014 Miscellaneous    Zometa 4 mg IV monthly      04/07/2015 Bone Marrow Biopsy    Normocellular marrow with 2-4% clonal plasma cells by immunohistochemistry. FISH and cytogenetics were normal [facility])        04/2015 Miscellaneous    PRETRANSPLANT EVALUATION:  Pulmonary function tests: FEV1 100.3% / DLCO 97.9%  Echocardiogram: Normal LV function with EF 60-65%       04/27/2015 Procedure    Stem cell mobilization with filgrastim and Mozobil [facility])      05/06/2015 Miscellaneous    BMT conditioning regimen with high-dose Melphalan given [facility], [name]); Day -1      05/07/2015 Bone Marrow Transplant    Outpatient autologous stem cell transplant [facility], [name]); Day 0      05/18/2015 Miscellaneous    WBC engraftment;  did not require platelet transfusion during her transplant process. Lowest platelet count 28,000       05/20/2015 Procedure    Tunneled catheter removed [facility])      09/08/2015 -  Chemotherapy    Velcade every 2 weeks      12/15/2015 Miscellaneous    Zometa re-instituted.       12/23/2015 Imaging    Bone density- BMD as determined from [facility] 33% is 0.799 g/cm2 with a T-Score of 1.2. This patient is considered normal according to [facility]) criteria.        INTERVAL HISTORY:  Ms.Kelli Hurley presents to the cancer center unaccompanied today for routine follow-up and for her next doses of Velcade and Zometa.  She reports feeling very well. Her appetite is great. Her energy levels are excellent as well. She denies any new bone pain or arthralgias. Denies any fever or chills. Denies  any frank bleeding in her stools, melena, dysuria, or hematuria. Denies any dizziness or headaches.  She is scheduled to see her stem cell transplant team in 05/2016.    She is largely with no complaints today.   REVIEW OF SYSTEMS:  Review of Systems  Constitutional: Negative.  Negative for appetite change, chills, fatigue and fever.  HENT:  Negative.  Negative for lump/mass and sore throat.   Eyes: Negative.   Respiratory: Negative.  Negative for cough and shortness of breath.   Cardiovascular: Negative.  Negative for chest pain and palpitations.  Gastrointestinal: Negative.  Negative for abdominal pain, blood in stool, constipation, diarrhea, nausea and vomiting.  Endocrine: Negative.    Genitourinary: Negative.  Negative for dysuria and hematuria.   Musculoskeletal: Negative.  Negative for arthralgias.  Skin: Negative.   Neurological: Negative.  Negative for dizziness and headaches.  Hematological: Negative.  Negative for adenopathy. Does not bruise/bleed easily.  Psychiatric/Behavioral: Negative.      PAST MEDICAL/SURGICAL HISTORY:  Past Medical History:  Diagnosis Date  . Anemia   . Claustrophobia 10/05/2014  . Hypertension   . Leukopenia 08/03/2014  . Normocytic hypochromic anemia 08/03/2014   Past Surgical History:  Procedure Laterality Date  . ABDOMINAL HYSTERECTOMY    . OTHER SURGICAL HISTORY     heart surgery as infant to "repair hole in heart"     SOCIAL HISTORY:  Social History   Social History  . Marital status: Legally Separated    Spouse name: N/A  . Number of children: N/A  . Years of education: N/A   Occupational History  . Not on file.   Social History Main Topics  . Smoking status: Never Smoker  . Smokeless tobacco: Never Used  . Alcohol use No  . Drug use: No  . Sexual activity: Not on file     Comment: divorced- 2 daughters   Other Topics Concern  . Not on file   Social History Narrative  . No narrative on file    FAMILY HISTORY:  Family History  Problem Relation Age of Onset  . Cancer Mother   . Hypertension Mother   . Cancer Father   . Hypertension Father   . Cancer Maternal Grandmother     CURRENT MEDICATIONS:  Outpatient Encounter Prescriptions as of 04/06/2016  Medication Sig  . acyclovir (ZOVIRAX) 800 MG tablet TAKE 1 TABLET BY MOUTH TWICE DAILY  . Bortezomib (VELCADE IJ) Inject as directed every 14 (fourteen) days.  . hydrochlorothiazide (HYDRODIURIL) 25 MG tablet Take 1 tablet (25 mg total) by mouth every morning.  . Multiple Vitamin (TAB-A-VITE) TABS TAKE 1 TABLET BY MOUTH ONCE DAILY.  Marland Kitchen potassium chloride SA (K-DUR,KLOR-CON) 20 MEQ tablet Take 1 tablet (20 mEq total) by mouth 2 (two) times daily.  Marland Kitchen  PROAIR HFA 108 (90 Base) MCG/ACT inhaler Inhale 2 puffs into the lungs daily as needed.   . zolendronic acid (ZOMETA) 4 MG/5ML injection Inject 4 mg into the vein every 28 (twenty-eight) days.   Facility-Administered Encounter Medications as of 04/06/2016  Medication  . heparin lock flush 100 unit/mL  . sodium chloride 0.9 % injection 10 mL    ALLERGIES:  No Known Allergies   PHYSICAL EXAM:  ECOG Performance status: 0  Physical Exam  Constitutional: She is oriented to person, place, and time and well-developed, well-nourished, and in no distress.  HENT:  Head: Normocephalic.  Mouth/Throat: Oropharynx is clear and moist. No oropharyngeal exudate.  Eyes: Conjunctivae are normal. Pupils are equal, round, and  reactive to light. No scleral icterus.  Neck: Normal range of motion. Neck supple.  Cardiovascular: Normal rate, regular rhythm and normal heart sounds.   Pulmonary/Chest: Effort normal and breath sounds normal. No respiratory distress.  Abdominal: Soft. Bowel sounds are normal. There is no tenderness.  Musculoskeletal: Normal range of motion. She exhibits no edema.  Lymphadenopathy:    She has no cervical adenopathy.  Neurological: She is alert and oriented to person, place, and time. No cranial nerve deficit. Gait normal.  Skin: Skin is warm and dry.  Psychiatric: Mood, memory, affect and judgment normal.     LABORATORY DATA:  I have reviewed the labs as listed.  CBC    Component Value Date/Time   WBC 5.4 03/30/2016 1210   RBC 4.05 03/30/2016 1210   HGB 11.9 (L) 03/30/2016 1210   HCT 35.6 (L) 03/30/2016 1210   PLT 200 03/30/2016 1210   MCV 87.9 03/30/2016 1210   MCH 29.4 03/30/2016 1210   MCHC 33.4 03/30/2016 1210   RDW 14.1 03/30/2016 1210   LYMPHSABS 1.9 03/30/2016 1210   MONOABS 0.4 03/30/2016 1210   EOSABS 0.2 03/30/2016 1210   BASOSABS 0.0 03/30/2016 1210   CMP Latest Ref Rng & Units 03/30/2016 03/23/2016 02/28/2016  Glucose 65 - 99 mg/dL 95 85 94  BUN 6 -  20 mg/dL 20 16 17   Creatinine 0.44 - 1.00 mg/dL 0.88 0.85 0.86  Sodium 135 - 145 mmol/L 138 139 137  Potassium 3.5 - 5.1 mmol/L 4.2 4.2 4.0  Chloride 101 - 111 mmol/L 104 104 103  CO2 22 - 32 mmol/L 27 27 28   Calcium 8.9 - 10.3 mg/dL 9.6 9.7 9.6  Total Protein 6.5 - 8.1 g/dL 7.8 7.9 7.6  Total Bilirubin 0.3 - 1.2 mg/dL 0.4 0.5 0.6  Alkaline Phos 38 - 126 U/L 46 52 44  AST 15 - 41 U/L 17 19 17   ALT 14 - 54 U/L 11(L) 13(L) 10(L)   Results for TEKEISHA, HAKIM (MRN [id number])   Ref. Range 03/30/2016 12:11  Total Protein ELP Latest Ref Range: 6.0 - 8.5 g/dL 7.5  Albumin ELP Latest Ref Range: 2.9 - 4.4 g/dL 4.0  Globulin, Total Latest Ref Range: 2.2 - 3.9 g/dL 3.5  A/G Ratio Latest Ref Range: 0.7 - 1.7  1.1  Alpha-1-Globulin Latest Ref Range: 0.0 - 0.4 g/dL 0.2  Alpha-2-Globulin Latest Ref Range: 0.4 - 1.0 g/dL 0.7  Beta Globulin Latest Ref Range: 0.7 - 1.3 g/dL 1.1  Gamma Globulin Latest Ref Range: 0.4 - 1.8 g/dL 1.4  M-SPIKE, % Latest Ref Range: Not Observed g/dL 0.4 (H)   Results for JOJO, GEVING (MRN [id number])  Ref. Range 03/30/2016 12:10  IgG (Immunoglobin G), Serum Latest Ref Range: 700 - 1,600 mg/dL 1,413  IgA Latest Ref Range: 87 - 352 mg/dL 136  IgM, Serum Latest Ref Range: 26 - 217 mg/dL 56  Kappa free light chain Latest Ref Range: 3.3 - 19.4 mg/L 24.8 (H)  Lamda free light chains Latest Ref Range: 5.7 - 26.3 mg/L 15.4  Kappa, lamda light chain ratio Latest Ref Range: 0.26 - 1.65  1.61         PENDING LABS:    DIAGNOSTIC IMAGING:  DEXA scan: 12/23/15   PATHOLOGY:     ASSESSMENT & PLAN:  Ms. Pat is a very pleasant 62 y.o. female with history of Multiple Myeloma with high-risk features (13q-, 17p-); treated with RVD therapy, then stem cell transplant at [facility] on 05/07/15.  She is currently on maintenance Velcade every 2 weeks, which was restarted after transplant on 09/08/15, with plans to continue to a total of 2 years. She is  also receiving Zometa monthly.    Multiple myeloma s/p stem cell transplant:  -Trending increase in Kappa free light chain values over past 3 months. Also M-spike slowly increasing as well; most recent value 0.4. Concerning for relapsed disease.  -We discussed patient's case via phone with Dr. Norma Fredrickson, the patient's transplant physician at [facility].  Per Dr. Rossie Muskrat recommendations:   *Increase Velcade scheduling to include Days 1, 8, & 15 every 28 days.   *Consider starting Ixaxomib 3 mg po on Days 1, 8, & 15 every 28 days in lieu of IV Velcade if approved by insurance. We will submit prescription for prior authorization.   *Maintain follow-up schedule at [facility] with Dr. Norma Fredrickson on 05/17/16 as scheduled.  -I explained these recommendations with Ms. Mancillas and she is in agreement.   Bone health:  -Continue Zometa monthly to reduce risk of skeletal events.      Dispo:  -Velcade & Zometa today as scheduled. Return for Velcade on Days 8 & 15 this cycle.   -Return to cancer center in 4 weeks for follow-up visit, prior to next cycle Velcade & Zometa doses.    All questions were answered to patient's stated satisfaction. Encouraged patient to call with any new concerns or questions before her next visit to the cancer center and we can certain see her sooner, if needed.    Plan of care discussed with Dr. Twana First, who agrees with the above aforementioned.    Mike Craze, NP [place] [phone]

## 2016-04-11 ENCOUNTER — Encounter (HOSPITAL_COMMUNITY): Payer: Medicare Other

## 2016-04-11 NOTE — Progress Notes (Signed)
Chemotherapy education pulled together.   Chemotherapy education completed over the phone.  Pt will sign consent when she comes in for office visit.  Pt is suppose to receive the drug on Friday.  She will call and verify when the receives the drug and that she is going to start.

## 2016-04-11 NOTE — Patient Instructions (Signed)
[place]   CHEMOTHERAPY INSTRUCTIONS  We are going to start you are Ninlaro.  You will take 3 mg (1 capsule) by mouth once a week for three weeks then off a week.  On days 1, 8, 15 every 28 days.   You will still continue your zometa treatment with Ninlaro.  You will see the doctor regularly throughout treatment.  We monitor your lab work prior to every treatment.  The doctor monitors your response to treatment by the way you are feeling, your blood work, and scans periodically.  POTENTIAL SIDE EFFECTS OF TREATMENT:  How Ixazomib Is Given: Ixazomib is a capsule taken once a week for 3 out of 4 weeks (Days 1, 8, 15 of a 28 day cycle).  Ixazomib should be taken on the same day of the week and at the same time of day.  Ixazomib should be taken on an empty stomach (1 hour before or two hours after a meal).  The capsule should be swallowed whole with a glass of water. Do not open the capsule, crush, chew or dissolve it.  If a dose is delayed or missed, take it only if the next schedule dose is at least 72 hours away. Do not take a missed dose within 3 days of the next scheduled dose. Do not double a dose to make up for a missed dose. If vomiting occurs, do not repeat the dose; resume dosing at the next scheduled dose. The amount of ixazomib that you will receive depends on many factors, including your height and weight, your general health or other health problems (e.g. kidney, liver problems), and the type of cancer or condition you have. Your doctor will determine your exact dosage and schedule.  Side Effects: Important things to remember about the side effects of ixazomib: Most people will not experience all of the ixazomib side effects listed.  Ixazomib side effects are often predictable in terms of their onset, duration, and severity.  Ixazomib side effects will improve after therapy is complete.  Ixazomib side effects may be quite manageable. There are many  options to minimize or prevent the side effects of ixazomib.  The following side effects are common (occurring in greater than 30%) for patients taking ixazomib: Low blood counts. Your white and red blood cells and platelets may temporarily decrease. This can put you at increased risk for infection, anemia and/or bleeding.  Nadir (low point): platelets are lowest between days 14-21 Diarrhea  Constipation  These are less common side effects (occurring in 10-29%) for patients receiving ixazomib: Peripheral neuropathy (numbness/tingling of your hands/feet)  Nausea  Eye disease  Peripheral edema (swelling or your lower legs/ankles)  Vomiting  Skin Rash  Upper respiratory infection Not all side effects are listed above. Side effects that are very rare -- occurring in less than about 10 percent of patients -- are not listed here. But you should always inform your health care provider if you experience any unusual symptoms.  When to contact your doctor or health care provider: Contact your health care provider immediately, day or night, if you should experience any of the following symptoms: Fever of 100.4 F (38 C) or higher, chills (possible signs of infection)  Unusual bleeding or bruising  Black or tarry stools, or blood in your stools  Blood in the urine  The following symptoms require medical attention, but are not an emergency. Contact your health care provider within 24 hours of noticing any of the following:  Nausea (interferes with ability to eat and unrelieved with prescribed medication)  Vomiting (vomiting more than 4-5 times in a 24 hour period)  Diarrhea (4-6 episodes in a 24-hour period)  Constipation unrelieved by laxative use.  Extreme fatigue (unable to carry on self-care activities)  Mouth sores (painful redness, swelling and ulcers)  Yellowing of the skin or eyes  Swelling of the feet or ankles. Sudden weight gain.  Signs of infection such as redness or swelling, pain  on swallowing, coughing up mucous, or painful urination.  Unable to eat or drink for 24 hours or have signs of dehydration: tiredness, thirst, dry mouth, dark and decrease amount of urine, or dizziness. Always inform your health care provider if you experience any unusual symptoms.  Precautions: Before starting ixazomib treatment, make sure you tell your doctor about any other medications you are taking (including prescription, over the counter, vitamins, herbal remedies, etc.) There may be serious drug interactions.  Do not take aspirin, products containing aspirin unless your doctor specifically permits this.  Do not take [place] while you are on this therapy.  Do not receive any kind of immunization or vaccination without your doctor's approval while taking ixazomib.  Inform your health care professional if you are pregnant or may be pregnant prior to starting this treatment. Pregnancy category X. Ixazomib may cause fetal harm when given to a pregnant woman. This drug must not be given to a pregnant woman or a woman who intends to become pregnant. If a woman becomes pregnant while taking ixazomib, the medication must be stopped immediately and the woman given appropriate counseling).  For both men and women: Use contraceptives, and do not conceive a child (get pregnant) while taking ixazomib. Barrier methods of contraception, such as condoms, are recommended for up to 3 months after last dose of ixazomib.  Do not breast feed while taking ixazomib.  Self-Care Tips: Drink at least two to three quarts of fluid every 24 hours, unless you are instructed otherwise.  You may be at risk of infection so try to avoid crowds or people with colds, and report fever or any other signs of infection immediately to your health care provider.  Wash your hands often.  To help treat/prevent mouth sores, use a soft toothbrush, and if you should have mouth sores/discomfort rinse three times a day with 1  teaspoon of baking soda with 8 ounces of water.  Use an electric razor and a soft toothbrush to minimize bleeding.  Avoid contact sports or activities that could cause injury.  To reduce nausea, take anti-nausea medication as prescribed by your doctor, and eat small, frequent meals.  Follow regimen of anti-diarrhea medication as prescribed by your health care professional.  Eat foods that may help reduce diarrhea (see managing side effects - diarrhea).  Keep your bowels moving. Your health care provider may prescribe a stool softener to help prevent constipation that may be caused by this medicine.  Avoid sun exposure. Wear SPF 48 (or higher) sunblock and protective clothing.  In general, drinking alcoholic beverages should be kept to a minimum or avoided completely. You should discuss this with your doctor.  Get plenty of rest.  Maintain good nutrition.  Remain active as you are able. Gentle exercise is encouraged such as a daily walk.  If you experience symptoms or side effects, be sure to discuss them with your health care team. They can prescribe medications and/or offer other suggestions that are effective in managing such problems.  SELF CARE ACTIVITIES WHILE ON CHEMOTHERAPY Hydration Increase your fluid intake 48 hours prior to treatment and drink at least 8 to 12 cups (64 ounces) of water/decaff beverages per day after treatment. You can still have your cup of coffee or soda but these beverages do not count as part of your 8 to 12 cups that you need to drink daily. No alcohol intake.  Medications Continue taking your normal prescription medication as prescribed.  If you start any new herbal or new supplements please let us know first to make sure it is safe.  Mouth Care Have teeth cleaned professionally before starting treatment. Keep dentures and partial plates clean. Use soft toothbrush and do not use mouthwashes that contain alcohol. Biotene is a good mouthwash that is available  at most pharmacies or may be ordered by calling [phone]. Use warm salt water gargles (1 teaspoon salt per 1 quart warm water) before and after meals and at bedtime. Or you may rinse with 2 tablespoons of three-percent hydrogen peroxide mixed in eight ounces of water. If you are still having problems with your mouth or sores in your mouth please call the clinic. If you need dental work, please let the doctor know before you go for your appointment so that we can coordinate the best possible time for you in regards to your chemo regimen. You need to also let your dentist know that you are actively taking chemo. We may need to do labs prior to your dental appointment.   Skin Care Always use sunscreen that has not expired and with SPF (Sun Protection Factor) of 50 or higher. Wear hats to protect your head from the sun. Remember to use sunscreen on your hands, ears, face, & feet.  Use good moisturizing lotions such as udder cream, eucerin, or even Vaseline. Some chemotherapies can cause dry skin, color changes in your skin and nails.    . Avoid long, hot showers or baths. . Use gentle, fragrance-free soaps and laundry detergent. . Use moisturizers, preferably creams or ointments rather than lotions because the thicker consistency is better at preventing skin dehydration. Apply the cream or ointment within 15 minutes of showering. Reapply moisturizer at night, and moisturize your hands every time after you wash them.  Hair Loss (if your doctor says your hair will fall out)  . If your doctor says that your hair is likely to fall out, decide before you begin chemo whether you want to wear a wig. You may want to shop before treatment to match your hair color. . Hats, turbans, and scarves can also camouflage hair loss, although some people prefer to leave their heads uncovered. If you go bare-headed outdoors, be sure to use sunscreen on your scalp. . Cut your hair short. It eases the inconvenience of  shedding lots of hair, but it also can reduce the emotional impact of watching your hair fall out. . Don't perm or color your hair during chemotherapy. Those chemical treatments are already damaging to hair and can enhance hair loss. Once your chemo treatments are done and your hair has grown back, it's OK to resume dyeing or perming hair. With chemotherapy, hair loss is almost always temporary. But when it grows back, it may be a different color or texture. In older adults who still had hair color before chemotherapy, the new growth may be completely gray.  Often, new hair is very fine and soft.  Infection Prevention Please wash your hands for at least 30 seconds using warm  soapy water. Handwashing is the #1 way to prevent the spread of germs. Stay away from sick people or people who are getting over a cold. If you develop respiratory systems such as green/yellow mucus production or productive cough or persistent cough let us know and we will see if you need an antibiotic. It is a good idea to keep a pair of gloves on when going into grocery stores/[employer] to decrease your risk of coming into contact with germs on the carts, etc. Carry alcohol hand gel with you at all times and use it frequently if out in public. If your temperature reaches 100.5 or higher please call the clinic and let us know.  If it is after hours or on the weekend please go to the ER if your temperature is over 100.5.  Please have your own personal thermometer at home to use.    Sex and bodily fluids If you are going to have sex, a condom must be used to protect the person that isn't taking chemotherapy. Chemo can decrease your libido (sex drive). For a few days after chemotherapy, chemotherapy can be excreted through your bodily fluids.  When using the toilet please close the lid and flush the toilet twice.  Do this for a few day after you have had chemotherapy.     Effects of chemotherapy on your sex life Some changes are simple  and won't last long. They won't affect your sex life permanently. Sometimes you may feel: . too tired . not strong enough to be very active . sick or sore  . not in the mood . anxious or low Your anxiety might not seem related to sex. For example, you may be worried about the cancer and how your treatment is going. Or you may be worried about money, or about how you family are coping with your illness. These things can cause stress, which can affect your interest in sex. It's important to talk to your partner about how you feel. Remember - the changes to your sex life don't usually last long. There's usually no medical reason to stop having sex during chemo. The drugs won't have any long term physical effects on your performance or enjoyment of sex. Cancer can't be passed on to your partner during sex  Contraception It's important to use reliable contraception during treatment. Avoid getting pregnant while you or your partner are having chemotherapy. This is because the drugs may harm the baby. Sometimes chemotherapy drugs can leave a man or woman infertile.  This means you would not be able to have children in the future. You might want to talk to someone about permanent infertility. It can be very difficult to learn that you may no longer be able to have children. Some people find counselling helpful. There might be ways to preserve your fertility, although this is easier for men than for women. You may want to speak to a fertility expert. You can talk about sperm banking or harvesting your eggs. You can also ask about other fertility options, such as donor eggs. If you have or have had breast cancer, your doctor might advise you not to take the contraceptive pill. This is because the hormones in it might affect the cancer.  It is not known for sure whether or not chemotherapy drugs can be passed on through semen or secretions from the vagina. Because of this some doctors advise people to use a  barrier method if you have sex during treatment. This applies to  vaginal, anal or oral sex. Generally, doctors advise a barrier method only for the time you are actually having the treatment and for about a week after your treatment. Advice like this can be worrying, but this does not mean that you have to avoid being intimate with your partner. You can still have close contact with your partner and continue to enjoy sex.  Animals If you have cats or birds we just ask that you not change the litter or change the cage.  Please have someone else do this for you while you are on chemotherapy.   Food Safety During and After Cancer Treatment Food safety is important for people both during and after cancer treatment. Cancer and cancer treatments, such as chemotherapy, radiation therapy, and stem cell/bone marrow transplantation, often weaken the immune system. This makes it harder for your body to protect itself from foodborne illness, also called food poisoning. Foodborne illness is caused by eating food that contains harmful bacteria, parasites, or viruses.  Foods to avoid Some foods have a higher risk of becoming tainted with bacteria. These include: Marland Kitchen Unwashed fresh fruit and vegetables, especially leafy vegetables that can hide dirt and other contaminants . Raw sprouts, such as alfalfa sprouts . Raw or undercooked beef, especially ground beef, or other raw or undercooked meat and poultry . Fatty, fried, or spicy foods immediately before or after treatment.  These can sit heavy on your stomach and make you feel nauseous. . Raw or undercooked shellfish, such as oysters. . Sushi and sashimi, which often contain raw fish.  . Unpasteurized beverages, such as unpasteurized fruit juices, raw milk, raw yogurt, or cider . Undercooked eggs, such as soft boiled, over easy, and poached; raw, unpasteurized eggs; or foods made with raw egg, such as homemade raw cookie dough and homemade mayonnaise Simple steps  for food safety Shop smart. . Do not buy food stored or displayed in an unclean area. . Do not buy bruised or damaged fruits or vegetables. . Do not buy cans that have cracks, dents, or bulges. . Pick up foods that can spoil at the end of your shopping trip and store them in a cooler on the way home. Prepare and clean up foods carefully. . Rinse all fresh fruits and vegetables under running water, and dry them with a clean towel or paper towel. . Clean the top of cans before opening them. . After preparing food, wash your hands for 20 seconds with hot water and soap. Pay special attention to areas between fingers and under nails. . Clean your utensils and dishes with hot water and soap. Marland Kitchen Disinfect your kitchen and cutting boards using 1 teaspoon of liquid, unscented bleach mixed into 1 quart of water.   Dispose of old food. . Eat canned and packaged food before its expiration date (the "use by" or "best before" date). . Consume refrigerated leftovers within 3 to 4 days. After that time, throw out the food. Even if the food does not smell or look spoiled, it still may be unsafe. Some bacteria, such as Listeria, can grow even on foods stored in the refrigerator if they are kept for too long. Take precautions when eating out. . At restaurants, avoid buffets and salad bars where food sits out for a long time and comes in contact with many people. Food can become contaminated when someone with a virus, often a norovirus, or another "bug" handles it. . Put any leftover food in a "to-go" container yourself, rather than having  the server do it. And, refrigerate leftovers as soon as you get home. . Choose restaurants that are clean and that are willing to prepare your food as you order it cooked.    MEDICATIONS: Over-the-Counter Meds:  Miralax 1 capful in 8 oz of fluid daily. May increase to two times a day if needed. This is a stool softener. If this doesn't work proceed you can add:  Senokot  S-start with 1 tablet two times a day and increase to 4 tablets two times a day if needed. (total of 8 tablets in a 24 hour period). This is a stimulant laxative.   Call us if this does not help your bowels move.   Imodium 2mg  capsule. Take 2 capsules after the 1st loose stool and then 1 capsule every 2 hours until you go a total of 12 hours without having a loose stool. Call the [place] if loose stools continue. If diarrhea occurs @ bedtime, take 2 capsules @ bedtime. Then take 2 capsules every 4 hours until morning. Call [place].     Constipation Sheet *Miralax in 8 oz of fluid daily.  May increase to two times a day if needed.  This is a stool softener.  If this not enough to keep your bowel regular:  You can add:  *Senokot S, start with one tablet twice a day and can increase to 4 tablets twice a day if needed.  This is a stimulant laxative.   Sometimes when you take pain medication you need BOTH a medicine to keep your stool soft and a medicine to help your bowel push it out!  Please call if the above does not work for you.   Do not go more than 2 days without a bowel movement.  It is very important that you do not become constipated.  It will make you feel sick to your stomach (nausea) and can cause abdominal pain and vomiting.    Nausea Sheet  Zofran/Ondansetron 8mg  tablet. Take 1 tablet every 8 hours as needed for nausea/vomiting. (#1 nausea med to take, this can constipate)  Compazine/Prochlorperazine 10mg  tablet. Take 1 tablet every 6 hours as needed for nausea/vomiting. (#2 nausea med to take, this can make you sleepy)  You can take these medications together or separately.  We would first like for you to try the Ondansetron by itself and then take the Prochloperizine if needed. But you are allowed to take both medications at the same time if your nausea is that severe.  If you are having persistent nausea (nausea that does not stop) please take these medications  on a staggered schedule so that the nausea medication stays in your body.  Please call the [place] and let us know the amount of nausea that you are experiencing.  If you begin to vomit, you need to call the [place] and if it is the weekend and you have vomited more than one time and cant get it to stop-go to the Emergency Room.  Persistent nausea/vomiting can lead to dehydration (loss of fluid in your body) and will make you feel terrible.   Ice chips, sips of clear liquids, foods that are @ room temperature, crackers, and toast tend to be better tolerated.    Diarrhea Sheet  If you are having loose stools/diarrhea, please purchase Imodium and begin taking as outlined:  At the first sign of poorly formed or loose stools you should begin taking Imodium(loperamide) 2 mg capsules.  Take two caplets (4mg )  followed by one caplet (2mg ) every 2 hours until you have had no diarrhea for 12 hours.  During the night take two caplets (4mg ) at bedtime and continue every 4 hours during the night until the morning.  Stop taking Imodium only after there is no sign of diarrhea for 12 hours.    Always call the [place] if you are having loose stools/diarrhea that you can't get under control.  Loose stools/disrrhea leads to dehydration (loss of water) in your body.  We have other options of trying to get the loose stools/diarrhea to stopped but you must let us know!     SYMPTOMS TO REPORT AS SOON AS POSSIBLE AFTER TREATMENT:  FEVER GREATER THAN 100.5 F  CHILLS WITH OR WITHOUT FEVER  NAUSEA AND VOMITING THAT IS NOT CONTROLLED WITH YOUR NAUSEA MEDICATION  UNUSUAL SHORTNESS OF BREATH  UNUSUAL BRUISING OR BLEEDING  TENDERNESS IN MOUTH AND THROAT WITH OR WITHOUT PRESENCE OF ULCERS  URINARY PROBLEMS  BOWEL PROBLEMS  UNUSUAL RASH    Wear comfortable clothing and clothing appropriate for easy access to any Portacath or PICC line. Let us know if there is anything that we can do to make your  therapy better!   What to do if you need assistance after hours or on the weekends:    CALL [phone].  HOLD on the line, do not hang up.  You will hear multiple messages but at the end you will be connected with a nurse triage line.  They will contact the doctor if necessary.  Most of the time they will be able to assist you.  Do not call the hospital operator.      I have been informed and understand all of the instructions given to me and have received a copy. I have been instructed to call the clinic [phone] or my family physician as soon as possible for continued medical care, if indicated. I do not have any more questions at this time but understand that I may call the [place] or the Patient Navigator at [phone] during office hours should I have questions or need assistance in obtaining follow-up care.

## 2016-04-13 ENCOUNTER — Other Ambulatory Visit (HOSPITAL_COMMUNITY): Payer: Medicare Other

## 2016-04-13 ENCOUNTER — Ambulatory Visit (HOSPITAL_COMMUNITY): Payer: Medicare Other

## 2016-04-13 ENCOUNTER — Inpatient Hospital Stay (HOSPITAL_COMMUNITY): Payer: Medicare Other

## 2016-04-14 ENCOUNTER — Telehealth (HOSPITAL_COMMUNITY): Payer: Self-pay | Admitting: Emergency Medicine

## 2016-04-14 NOTE — Telephone Encounter (Signed)
Pt is going to start taking her ninlaro on Monday, 3/12.  She will take 1 pill for 3 mondays in a row and off 1 Monday.  She verbalized understanding.

## 2016-04-18 ENCOUNTER — Other Ambulatory Visit (HOSPITAL_COMMUNITY): Payer: Self-pay | Admitting: Oncology

## 2016-04-18 ENCOUNTER — Other Ambulatory Visit (HOSPITAL_COMMUNITY): Payer: Self-pay | Admitting: Hematology & Oncology

## 2016-04-18 DIAGNOSIS — C9000 Multiple myeloma not having achieved remission: Secondary | ICD-10-CM

## 2016-04-18 DIAGNOSIS — Z79899 Other long term (current) drug therapy: Secondary | ICD-10-CM

## 2016-04-20 ENCOUNTER — Other Ambulatory Visit (HOSPITAL_COMMUNITY): Payer: Medicare Other

## 2016-04-20 ENCOUNTER — Ambulatory Visit (HOSPITAL_COMMUNITY): Payer: Medicare Other

## 2016-04-30 IMAGING — DX DG BONE SURVEY MET
9 of 10 series · 9 of 10 positions shown · non-contrast
Comparison: None.

CLINICAL DATA: Normocytic hypochromic anemia.  Leukopenia.

EXAM:
METASTATIC BONE SURVEY

[chest pa]
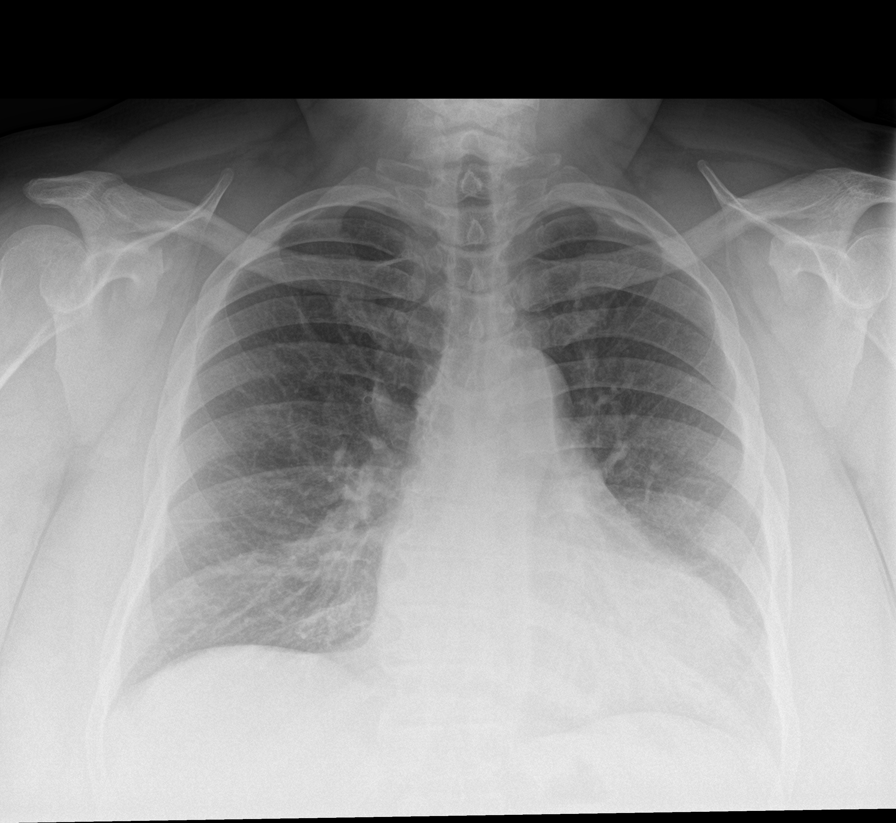

[c-spine lat]
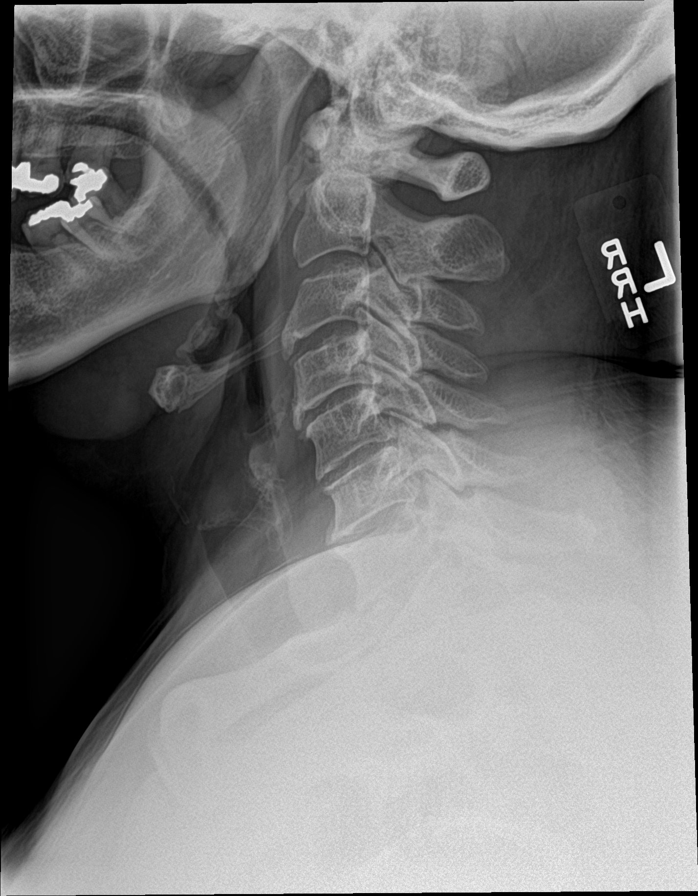

[skull lat]
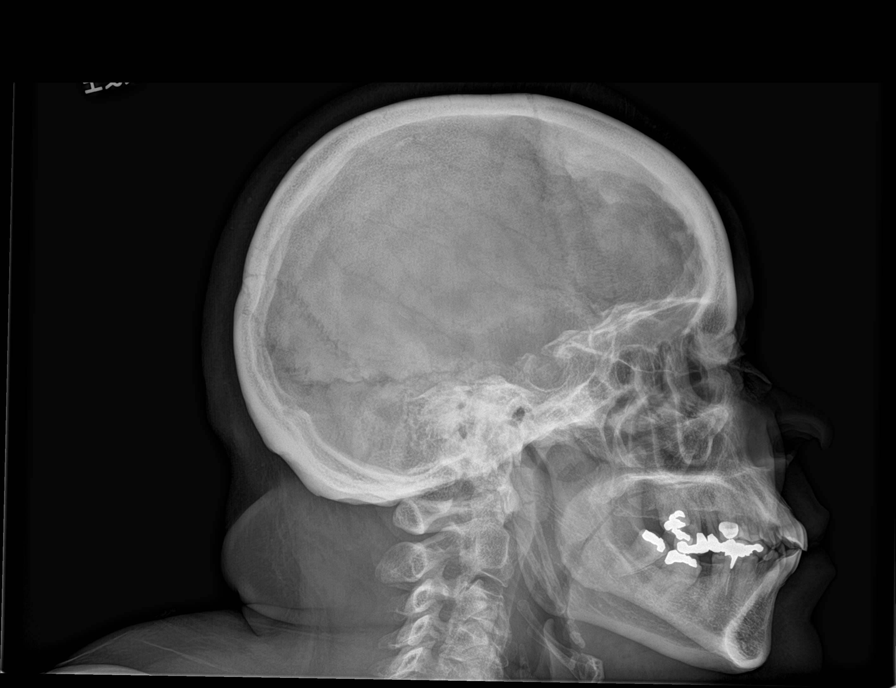

[shoulder ap (1 of 2)]
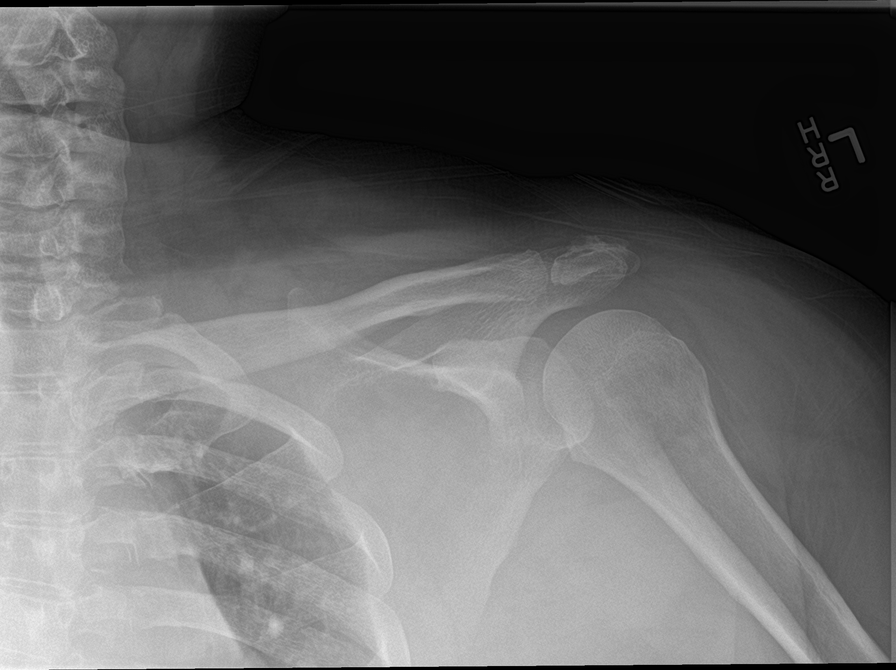

[shoulder ap (2 of 2)]
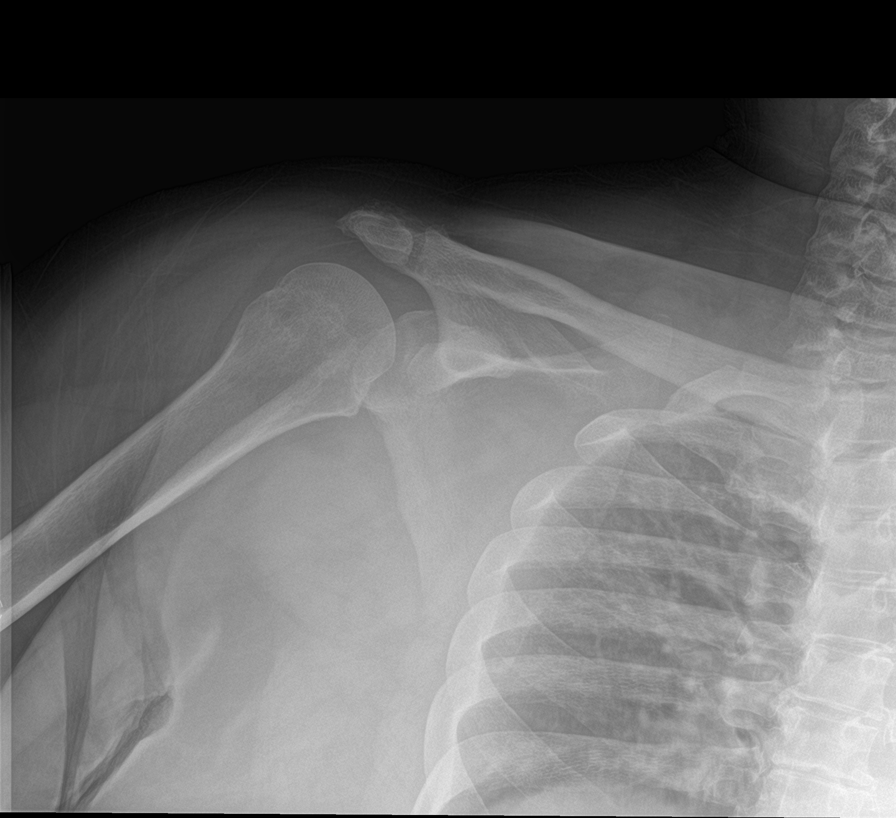

[humerus ap (1 of 2)]
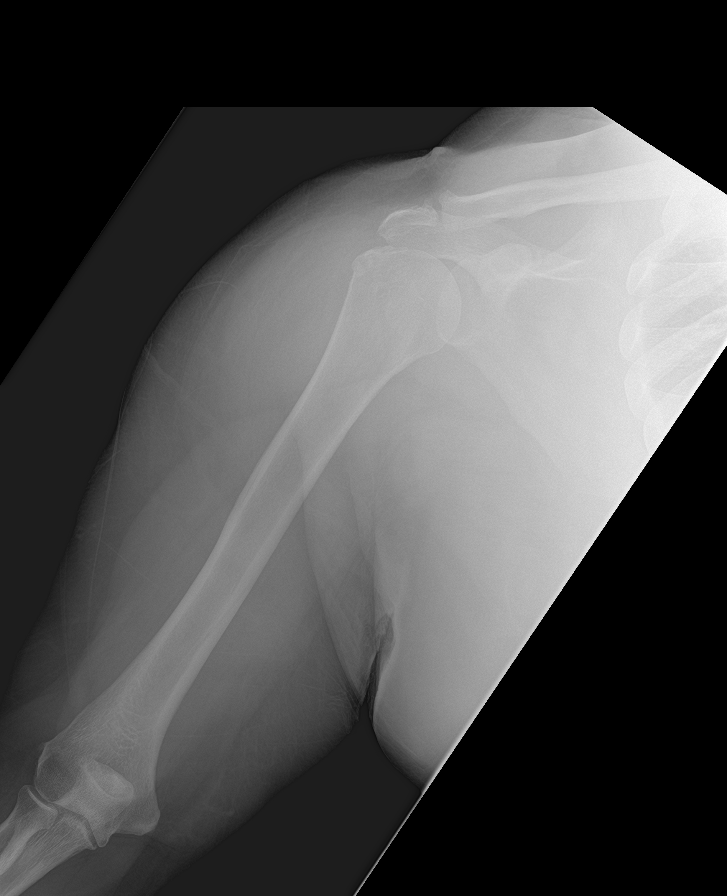

[humerus ap (2 of 2)]
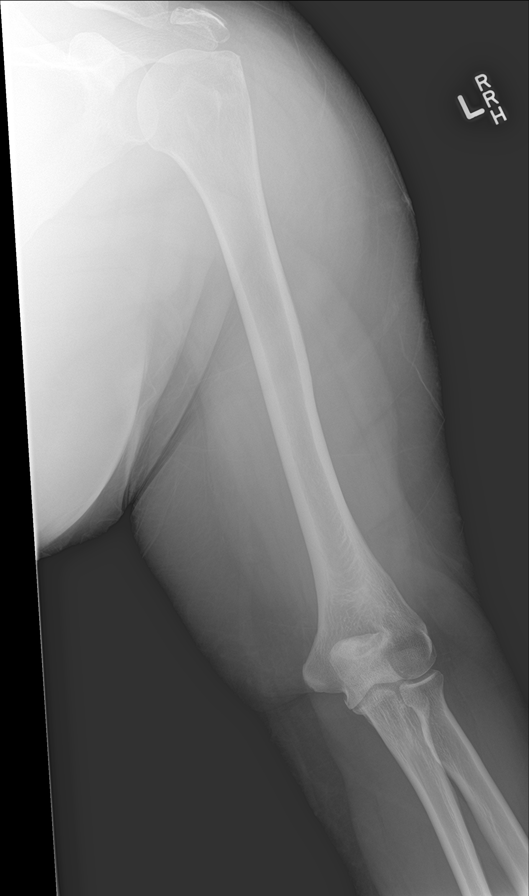

[c-spine ap]
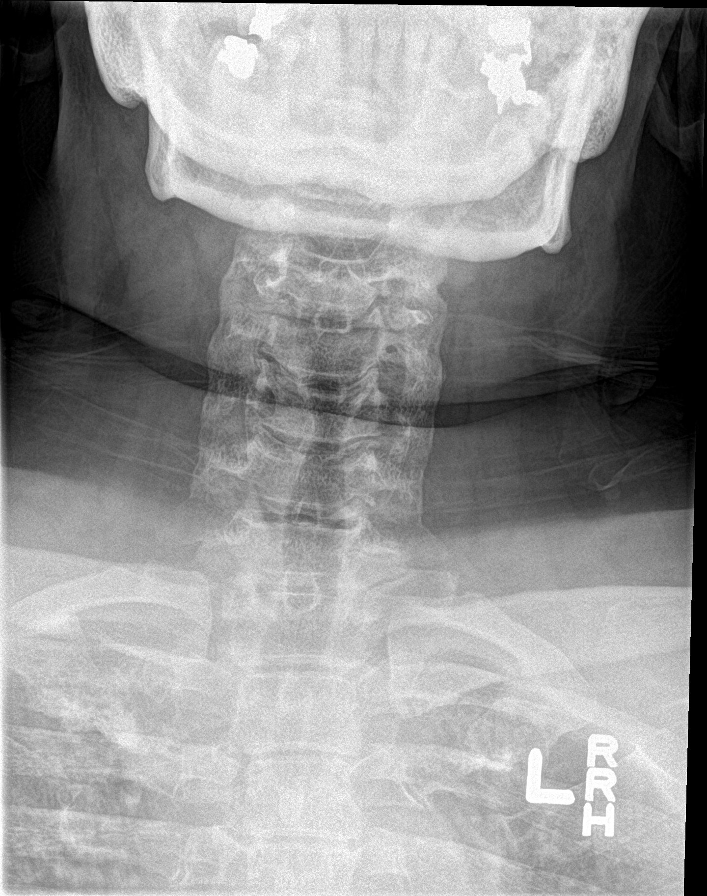

[t-spine ap]
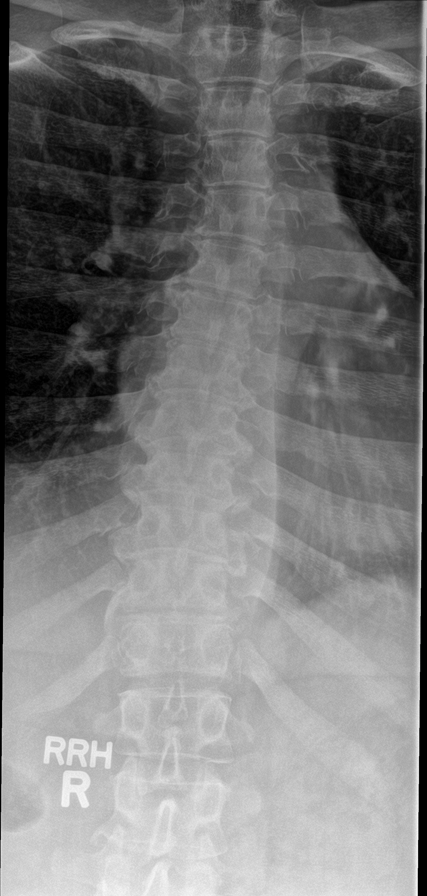

[9 of 10 positions shown; findings below may reference images not displayed]

FINDINGS: Multilevel degenerative disc disease is noted in the cervical and
thoracic spine. Severe degenerative disc disease is noted in both
knees. No areas of abnormal sclerosis or lytic destruction are seen
in the visualized skeleton.
IMPRESSION: No lytic lesions are noted in the visualized skeleton.

## 2016-05-04 ENCOUNTER — Ambulatory Visit (HOSPITAL_COMMUNITY): Payer: Medicare Other

## 2016-05-04 ENCOUNTER — Encounter (HOSPITAL_COMMUNITY): Payer: Medicare Other

## 2016-05-04 ENCOUNTER — Encounter (HOSPITAL_BASED_OUTPATIENT_CLINIC_OR_DEPARTMENT_OTHER): Payer: Medicare Other

## 2016-05-04 ENCOUNTER — Encounter (HOSPITAL_COMMUNITY): Payer: Self-pay

## 2016-05-04 ENCOUNTER — Encounter (HOSPITAL_BASED_OUTPATIENT_CLINIC_OR_DEPARTMENT_OTHER): Payer: Medicare Other | Admitting: Oncology

## 2016-05-04 VITALS — BP 162/87 | HR 110 | Temp 98.2°F | Resp 18

## 2016-05-04 DIAGNOSIS — C9001 Multiple myeloma in remission: Secondary | ICD-10-CM

## 2016-05-04 DIAGNOSIS — C9000 Multiple myeloma not having achieved remission: Secondary | ICD-10-CM

## 2016-05-04 LAB — COMPREHENSIVE METABOLIC PANEL
ALBUMIN: 4.5 g/dL (ref 3.5–5.0)
ALT: 14 U/L (ref 14–54)
ANION GAP: 9 (ref 5–15)
AST: 24 U/L (ref 15–41)
Alkaline Phosphatase: 54 U/L (ref 38–126)
BUN: 16 mg/dL (ref 6–20)
CALCIUM: 9.3 mg/dL (ref 8.9–10.3)
CO2: 28 mmol/L (ref 22–32)
Chloride: 101 mmol/L (ref 101–111)
Creatinine, Ser: 1.01 mg/dL — ABNORMAL HIGH (ref 0.44–1.00)
GFR calc non Af Amer: 60 mL/min — ABNORMAL LOW (ref >60)
GLUCOSE: 104 mg/dL — AB (ref 65–99)
Potassium: 3.7 mmol/L (ref 3.5–5.1)
Sodium: 138 mmol/L (ref 135–145)
Total Bilirubin: 0.5 mg/dL (ref 0.3–1.2)
Total Protein: 8.5 g/dL — ABNORMAL HIGH (ref 6.5–8.1)

## 2016-05-04 LAB — LACTATE DEHYDROGENASE: LDH: 202 U/L — ABNORMAL HIGH (ref 98–192)

## 2016-05-04 LAB — CBC WITH DIFFERENTIAL/PLATELET
BASOS PCT: 0 %
Basophils Absolute: 0 10*3/uL (ref 0.0–0.1)
Eosinophils Absolute: 0.2 10*3/uL (ref 0.0–0.7)
Eosinophils Relative: 3 %
HEMATOCRIT: 36.5 % (ref 36.0–46.0)
HEMOGLOBIN: 11.9 g/dL — AB (ref 12.0–15.0)
LYMPHS ABS: 2 10*3/uL (ref 0.7–4.0)
Lymphocytes Relative: 26 %
MCH: 28.9 pg (ref 26.0–34.0)
MCHC: 32.6 g/dL (ref 30.0–36.0)
MCV: 88.6 fL (ref 78.0–100.0)
MONO ABS: 0.5 10*3/uL (ref 0.1–1.0)
MONOS PCT: 7 %
NEUTROS ABS: 5 10*3/uL (ref 1.7–7.7)
NEUTROS PCT: 64 %
Platelets: 235 10*3/uL (ref 150–400)
RBC: 4.12 MIL/uL (ref 3.87–5.11)
RDW: 13.9 % (ref 11.5–15.5)
WBC: 7.7 10*3/uL (ref 4.0–10.5)

## 2016-05-04 LAB — MAGNESIUM: Magnesium: 1.9 mg/dL (ref 1.7–2.4)

## 2016-05-04 MED ORDER — IXAZOMIB CITRATE 3 MG PO CAPS: 3.0000 mg | ORAL_CAPSULE | ORAL | 0 refills | Status: DC

## 2016-05-04 MED ORDER — DENOSUMAB 120 MG/1.7ML ~~LOC~~ SOLN
120.0000 mg | Freq: Once | SUBCUTANEOUS | Status: AC
  Administered 2016-05-04: 120 mg via SUBCUTANEOUS
  Filled 2016-05-04: qty 1.7

## 2016-05-04 NOTE — Progress Notes (Signed)
Multiple unsuccessful attempts to get IV access. MD notified and switched to Delton See per MD.   Kelli Hurley presents today for injection per MD orders. Xgeva 120mg  administered SQ in right Abdomen. Administration without incident. Patient tolerated well.   Consent signed prior to injection and medication information given to patient. Will hold off on adding vitamin D and calcium at this time per Dr. Talbert Cage.

## 2016-05-04 NOTE — Progress Notes (Signed)
[place], [place] [address]   CLINIC:  Medical Oncology/Hematology Progress Note  PCP:  Mackey Birchwood [address] [place] [address] [phone]   REASON FOR VISIT:  Follow-up for h/o Multiple Myeloma s/p stem cell transplant on 05/07/15  CURRENT THERAPY: Maintenance Velcade every 2 weeks & Zometa monthly   BRIEF ONCOLOGIC HISTORY:    Multiple myeloma ([place])   08/07/2014 Imaging    Bone Survey- No lytic lesions are noted in the visualized skeleton.      09/03/2014 Bone Marrow Biopsy    NORMOCELLULAR BONE MARROW FOR AGE WITH PLASMA CELL NEOPLASM.  The plasma cell component is increased in the marrow representing an estimated 18% of all cells. Cytogenetics with 13q-, 17p- (high risk disease)      09/03/2014 Pathology Results    Cytogenetics with 13q-, 17p- (high risk disease)      09/23/2014 Initial Diagnosis    Multiple myeloma      09/30/2014 PET scan    No abnormal hypermetabolism in the neck, chest, abdomen or pelvis.      10/05/2014 - 03/22/2015 Chemotherapy    RVD      10/28/2014 Treatment Plan Change    Issues related to getting Revlimid in a timely fashion, therefore, she received her Revlimid on 9/21 resulting in a 12 day cycle this time instead of a 14 day cycle      11/02/2014 Imaging    CTA chest- No evidence for a large or central pulmonary embolism as described.  8 mm density along the right minor fissure could represent focal pleural thickening but indeterminate. If the patient is at high risk for bronchogenic carcinoma, follow-up c      11/23/2014 Miscellaneous    Zometa 4 mg IV monthly      04/07/2015 Bone Marrow Biopsy    Normocellular marrow with 2-4% clonal plasma cells by immunohistochemistry. FISH and cytogenetics were normal [facility])        04/2015 Miscellaneous    PRETRANSPLANT EVALUATION:  Pulmonary function tests: FEV1 100.3% / DLCO 97.9%  Echocardiogram: Normal LV function with EF  60-65%       04/27/2015 Procedure    Stem cell mobilization with filgrastim and Mozobil [facility])      05/06/2015 Miscellaneous    BMT conditioning regimen with high-dose Melphalan given [facility], [name]); Day -1      05/07/2015 Bone Marrow Transplant    Outpatient autologous stem cell transplant [facility], [name]); Day 0      05/18/2015 Miscellaneous    WBC engraftment;  did not require platelet transfusion during her transplant process. Lowest platelet count 28,000       05/20/2015 Procedure    Tunneled catheter removed [facility])      09/08/2015 -  Chemotherapy    Velcade every 2 weeks      12/15/2015 Miscellaneous    Zometa re-instituted.       12/23/2015 Imaging    Bone density- BMD as determined from Forearm Radius 33% is 0.799 g/cm2 with a T-Score of 1.2. This patient is considered normal according to [facility]) criteria.        INTERVAL HISTORY:  KelliSappenfield presents to the cancer center unaccompanied today for routine follow-up.  She is scheduled to see her stem cell transplant team in 05/2016.    She states she has a cold with congestion. She notes she received it from her granddaughter. She has had it for  the past week. She is coughing up green sputum.  She notes it is starting to resolve and she feels better than before.. She denies any accompanying fever or chills.   Denies chest pain, sob, abdominal pain, nausea, vomiting, diarrhea, constipation, and fatigue.   She is largely with no complaints today.  She requested a refill on her Ninlaro.   REVIEW OF SYSTEMS:  Review of Systems  Constitutional: Negative.  Negative for chills, fatigue and fever.  HENT:  Negative.        Production of green sputum after coughing. Has a cold with congestion.  Eyes: Negative.   Respiratory: Positive for cough. Negative for shortness of breath.   Cardiovascular: Negative.  Negative for chest pain.  Gastrointestinal: Negative.  Negative  for abdominal pain, constipation, diarrhea, nausea and vomiting.  Endocrine: Negative.   Genitourinary: Negative.    Musculoskeletal: Negative.   Skin: Negative.   Neurological: Negative.   Hematological: Negative.   Psychiatric/Behavioral: Negative.   All other systems reviewed and are negative.    PAST MEDICAL/SURGICAL HISTORY:  Past Medical History:  Diagnosis Date  . Anemia   . Claustrophobia 10/05/2014  . Hypertension   . Leukopenia 08/03/2014  . Normocytic hypochromic anemia 08/03/2014   Past Surgical History:  Procedure Laterality Date  . ABDOMINAL HYSTERECTOMY    . OTHER SURGICAL HISTORY     heart surgery as infant to "repair hole in heart"     SOCIAL HISTORY:  Social History   Social History  . Marital status: Legally Separated    Spouse name: N/A  . Number of children: N/A  . Years of education: N/A   Occupational History  . Not on file.   Social History Main Topics  . Smoking status: Never Smoker  . Smokeless tobacco: Never Used  . Alcohol use No  . Drug use: No  . Sexual activity: Not on file     Comment: divorced- 2 daughters   Other Topics Concern  . Not on file   Social History Narrative  . No narrative on file    FAMILY HISTORY:  Family History  Problem Relation Age of Onset  . Cancer Mother   . Hypertension Mother   . Cancer Father   . Hypertension Father   . Cancer Maternal Grandmother     CURRENT MEDICATIONS:  Outpatient Encounter Prescriptions as of 04/06/2016  Medication Sig  . acyclovir (ZOVIRAX) 800 MG tablet TAKE 1 TABLET BY MOUTH TWICE DAILY  . Bortezomib (VELCADE IJ) Inject as directed every 14 (fourteen) days.  . hydrochlorothiazide (HYDRODIURIL) 25 MG tablet Take 1 tablet (25 mg total) by mouth every morning.  . Multiple Vitamin (TAB-A-VITE) TABS TAKE 1 TABLET BY MOUTH ONCE DAILY.  Marland Kitchen potassium chloride SA (K-DUR,KLOR-CON) 20 MEQ tablet Take 1 tablet (20 mEq total) by mouth 2 (two) times daily.  Marland Kitchen PROAIR HFA 108 (90 Base)  MCG/ACT inhaler Inhale 2 puffs into the lungs daily as needed.   . zolendronic acid (ZOMETA) 4 MG/5ML injection Inject 4 mg into the vein every 28 (twenty-eight) days.   Facility-Administered Encounter Medications as of 04/06/2016  Medication  . heparin lock flush 100 unit/mL  . sodium chloride 0.9 % injection 10 mL    ALLERGIES:  No Known Allergies   PHYSICAL EXAM:  ECOG Performance status: 0  There were no vitals filed for this visit. There were no vitals filed for this visit.   Physical Exam  Constitutional: She is oriented to person, place, and time and well-developed,  well-nourished, and in no distress.  HENT:  Head: Normocephalic and atraumatic.  Mouth/Throat: Oropharynx is clear and moist. No oropharyngeal exudate.  Eyes: Conjunctivae and EOM are normal. Pupils are equal, round, and reactive to light. No scleral icterus.  Neck: Normal range of motion. Neck supple.  Cardiovascular: Normal rate, regular rhythm and normal heart sounds.   Pulmonary/Chest: Effort normal and breath sounds normal. No respiratory distress.  Abdominal: Soft. Bowel sounds are normal. There is no tenderness.  Musculoskeletal: Normal range of motion. She exhibits no edema.  Lymphadenopathy:    She has no cervical adenopathy.  Neurological: She is alert and oriented to person, place, and time. No cranial nerve deficit. Gait normal.  Skin: Skin is warm and dry.  Psychiatric: Mood, memory, affect and judgment normal.  Nursing note and vitals reviewed.    LABORATORY DATA:  I have reviewed the labs as listed.  CBC    Component Value Date/Time   WBC 7.7 05/04/2016 1225   RBC 4.12 05/04/2016 1225   HGB 11.9 (L) 05/04/2016 1225   HCT 36.5 05/04/2016 1225   PLT 235 05/04/2016 1225   MCV 88.6 05/04/2016 1225   MCH 28.9 05/04/2016 1225   MCHC 32.6 05/04/2016 1225   RDW 13.9 05/04/2016 1225   LYMPHSABS 2.0 05/04/2016 1225   MONOABS 0.5 05/04/2016 1225   EOSABS 0.2 05/04/2016 1225   BASOSABS 0.0  05/04/2016 1225   CMP Latest Ref Rng & Units 05/04/2016 03/30/2016 03/23/2016  Glucose 65 - 99 mg/dL 104(H) 95 85  BUN 6 - 20 mg/dL 16 20 16   Creatinine 0.44 - 1.00 mg/dL 1.01(H) 0.88 0.85  Sodium 135 - 145 mmol/L 138 138 139  Potassium 3.5 - 5.1 mmol/L 3.7 4.2 4.2  Chloride 101 - 111 mmol/L 101 104 104  CO2 22 - 32 mmol/L 28 27 27   Calcium 8.9 - 10.3 mg/dL 9.3 9.6 9.7  Total Protein 6.5 - 8.1 g/dL 8.5(H) 7.8 7.9  Total Bilirubin 0.3 - 1.2 mg/dL 0.5 0.4 0.5  Alkaline Phos 38 - 126 U/L 54 46 52  AST 15 - 41 U/L 24 17 19   ALT 14 - 54 U/L 14 11(L) 13(L)   Results for MAUDEAN, HOFFMANN (MRN [id number])   Ref. Range 03/30/2016 12:11  Total Protein ELP Latest Ref Range: 6.0 - 8.5 g/dL 7.5  Albumin ELP Latest Ref Range: 2.9 - 4.4 g/dL 4.0  Globulin, Total Latest Ref Range: 2.2 - 3.9 g/dL 3.5  A/G Ratio Latest Ref Range: 0.7 - 1.7  1.1  Alpha-1-Globulin Latest Ref Range: 0.0 - 0.4 g/dL 0.2  Alpha-2-Globulin Latest Ref Range: 0.4 - 1.0 g/dL 0.7  Beta Globulin Latest Ref Range: 0.7 - 1.3 g/dL 1.1  Gamma Globulin Latest Ref Range: 0.4 - 1.8 g/dL 1.4  M-SPIKE, % Latest Ref Range: Not Observed g/dL 0.4 (H)   Results for ZITLALI, PRIMM (MRN [id number])  Ref. Range 03/30/2016 12:10  IgG (Immunoglobin G), Serum Latest Ref Range: 700 - 1,600 mg/dL 1,413  IgA Latest Ref Range: 87 - 352 mg/dL 136  IgM, Serum Latest Ref Range: 26 - 217 mg/dL 56  Kappa free light chain Latest Ref Range: 3.3 - 19.4 mg/L 24.8 (H)  Lamda free light chains Latest Ref Range: 5.7 - 26.3 mg/L 15.4  Kappa, lamda light chain ratio Latest Ref Range: 0.26 - 1.65  1.61         PENDING LABS:    DIAGNOSTIC IMAGING:  I have personally reviewed the radiological images as listed and  agreed with the findings in the report.   DEXA scan 12/23/2015   PATHOLOGY:     ASSESSMENT & PLAN:  Kelli Hurley is a very pleasant 61 y.o. female with history of Multiple Myeloma with high-risk features (13q-, 17p-); treated with  RVD therapy, then stem cell transplant at [facility] on 05/07/15.  She is currently on maintenance Velcade every 2 weeks, which was restarted after transplant on 09/08/15, with plans to continue to a total of 2 years. She is also receiving Zometa monthly.    Multiple myeloma s/p stem cell transplant:  -Trending increase in Kappa free light chain values over past 3 months. Also M-spike slowly increasing as well; most recent value 0.4. Concerning for relapsed disease.  -We discussed patient's case via phone with Dr. Norma Fredrickson, the patient's transplant physician at [facility].  Per Dr. Rossie Muskrat recommendations:   *Increase Velcade scheduling to include Days 1, 8, & 15 every 28 days.   *Consider starting Ixaxomib (Ninlaro) 3 mg po on Days 1, 8, & 15 every 28 days in lieu of IV Velcade if approved by insurance. We will submit prescription for prior authorization.   *Maintain follow-up schedule at [facility] with Dr. Norma Fredrickson on 05/17/16 as scheduled.  -I explained these recommendations with Ms. Oguin and she is in agreement.  - She was switched to Ninlaro (started 04/17/16) and is tolerating it well without adverse side effects. Will follow up on her MM labs to assess response to treatment. Continue ninlaro.   Bone health:  -It is very difficult to get venous access on the patient. After multiple attempts no venous access was obtained. This has been an ongoing issue, therefore I have discontinued her zometa and will place her on xgeva  from now on for prevention of skeletal related events .    Dispo: RTC in 4 weeks for continued follow up..   All questions were answered to patient's stated satisfaction. Encouraged patient to call with any new concerns or questions before her next visit to the cancer center and we can certain see her sooner, if needed.    This document serves as a record of services personally performed by Twana First, MD. It was created on her behalf by  Shirlean Mylar, a trained medical scribe. The creation of this record is based on the scribe's personal observations and the provider's statements to them. This document has been checked and approved by the attending provider.  I have reviewed the above documentation for accuracy and completeness and I agree with the above.

## 2016-05-04 NOTE — Patient Instructions (Signed)
[facility] at [facility] Discharge Instructions  RECOMMENDATIONS MADE BY THE CONSULTANT AND ANY TEST RESULTS WILL BE SENT TO YOUR REFERRING PHYSICIAN.  You were seen today by Dr. Louise Zhou Follow up in 4 weeks with lab work See Amy up front for appointments   Thank you for choosing [facility] at [facility] to provide your oncology and hematology care.  To afford each patient quality time with our provider, please arrive at least 15 minutes before your scheduled appointment time.    If you have a lab appointment with the Cancer Center please come in thru the  Main Entrance and check in at the main information desk  You need to re-schedule your appointment should you arrive 10 or more minutes late.  We strive to give you quality time with our providers, and arriving late affects you and other patients whose appointments are after yours.  Also, if you no show three or more times for appointments you may be dismissed from the clinic at the providers discretion.     Again, thank you for choosing [facility].  Our hope is that these requests will decrease the amount of time that you wait before being seen by our physicians.       _____________________________________________________________  Should you have questions after your visit to [facility], please contact our office at [phone] between the hours of 8:30 a.m. and 4:30 p.m.  Voicemails left after 4:30 p.m. will not be returned until the following business day.  For prescription refill requests, have your pharmacy contact our office.       Resources For Cancer Patients and their Caregivers ? American Cancer Society: Can assist with transportation, wigs, general needs, runs Look Good Feel Better.        1-888-227-6333 ? Cancer Care: Provides financial assistance, online support groups, medication/co-pay assistance.  1-800-813-HOPE (4673) ? [facility] Assists [place] cancer patients and their families through emotional , educational and financial support.  336-427-4357 ? [name] Where to apply for food stamps, Medicaid and utility assistance. 336-342-1394 ? [name]: Transportation to medical appointments. 336-347-2287 ? Social Security Administration: May apply for disability if have a Stage IV cancer. 336-342-7796 1-800-772-1213 ? [place] Aging, Disability and Transit Services: Assists with nutrition, care and transit needs. 336-349-2343  Cancer Center Support Programs: @10RELATIVEDAYS@ > Cancer Support Group  2nd Tuesday of the month 1pm-2pm, Journey Room  > Creative Journey  3rd Tuesday of the month 1130am-1pm, Journey Room  > Look Good Feel Better  1st Wednesday of the month 10am-12 noon, Journey Room (Call American Cancer Society to register 1-800-395-5775)    

## 2016-05-04 NOTE — Progress Notes (Signed)
1400 attempted 2 unsuccessful IV attempts once in left antecubital #22g and once left hand #24g.

## 2016-05-04 NOTE — Patient Instructions (Signed)
[facility] at [facility] Discharge Instructions  RECOMMENDATIONS MADE BY THE CONSULTANT AND ANY TEST RESULTS WILL BE SENT TO YOUR REFERRING PHYSICIAN.  xgeva given  Follow up as scheduled.  Thank you for choosing [facility] at [facility] to provide your oncology and hematology care.  To afford each patient quality time with our provider, please arrive at least 15 minutes before your scheduled appointment time.    If you have a lab appointment with the Cancer Center please come in thru the  Main Entrance and check in at the main information desk  You need to re-schedule your appointment should you arrive 10 or more minutes late.  We strive to give you quality time with our providers, and arriving late affects you and other patients whose appointments are after yours.  Also, if you no show three or more times for appointments you may be dismissed from the clinic at the providers discretion.     Again, thank you for choosing [facility].  Our hope is that these requests will decrease the amount of time that you wait before being seen by our physicians.       _____________________________________________________________  Should you have questions after your visit to [facility], please contact our office at [phone] between the hours of 8:30 a.m. and 4:30 p.m.  Voicemails left after 4:30 p.m. will not be returned until the following business day.  For prescription refill requests, have your pharmacy contact our office.       Resources For Cancer Patients and their Caregivers ? American Cancer Society: Can assist with transportation, wigs, general needs, runs Look Good Feel Better.        1-888-227-6333 ? Cancer Care: Provides financial assistance, online support groups, medication/co-pay assistance.  1-800-813-HOPE (4673) ? [facility] Assists [place] cancer patients and their  families through emotional , educational and financial support.  336-427-4357 ? [name] Where to apply for food stamps, Medicaid and utility assistance. 336-342-1394 ? [name]: Transportation to medical appointments. 336-347-2287 ? Social Security Administration: May apply for disability if have a Stage IV cancer. 336-342-7796 1-800-772-1213 ? [place] Aging, Disability and Transit Services: Assists with nutrition, care and transit needs. 336-349-2343  Cancer Center Support Programs: @10RELATIVEDAYS@ > Cancer Support Group  2nd Tuesday of the month 1pm-2pm, Journey Room  > Creative Journey  3rd Tuesday of the month 1130am-1pm, Journey Room  > Look Good Feel Better  1st Wednesday of the month 10am-12 noon, Journey Room (Call American Cancer Society to register 1-800-395-5775)   

## 2016-05-05 LAB — PROTEIN ELECTROPHORESIS, SERUM
A/G Ratio: 1.1 (ref 0.7–1.7)
ALBUMIN ELP: 4.1 g/dL (ref 2.9–4.4)
Alpha-1-Globulin: 0.3 g/dL (ref 0.0–0.4)
Alpha-2-Globulin: 0.9 g/dL (ref 0.4–1.0)
Beta Globulin: 1.2 g/dL (ref 0.7–1.3)
Gamma Globulin: 1.5 g/dL (ref 0.4–1.8)
Globulin, Total: 3.8 g/dL (ref 2.2–3.9)
M-SPIKE, %: 0.3 g/dL — AB
TOTAL PROTEIN ELP: 7.9 g/dL (ref 6.0–8.5)

## 2016-05-05 LAB — KAPPA/LAMBDA LIGHT CHAINS
Kappa free light chain: 25.5 mg/L — ABNORMAL HIGH (ref 3.3–19.4)
Kappa, lambda light chain ratio: 1.77 — ABNORMAL HIGH (ref 0.26–1.65)
Lambda free light chains: 14.4 mg/L (ref 5.7–26.3)

## 2016-05-05 LAB — IGG, IGA, IGM
IGG (IMMUNOGLOBIN G), SERUM: 1396 mg/dL (ref 700–1600)
IGM, SERUM: 59 mg/dL (ref 26–217)
IgA: 133 mg/dL (ref 87–352)

## 2016-05-05 LAB — BETA 2 MICROGLOBULIN, SERUM: BETA 2 MICROGLOBULIN: 3 mg/L — AB (ref 0.6–2.4)

## 2016-05-08 ENCOUNTER — Other Ambulatory Visit (HOSPITAL_COMMUNITY): Payer: Self-pay | Admitting: Oncology

## 2016-05-08 DIAGNOSIS — E876 Hypokalemia: Secondary | ICD-10-CM

## 2016-05-08 LAB — IMMUNOFIXATION ELECTROPHORESIS
IGG (IMMUNOGLOBIN G), SERUM: 1466 mg/dL (ref 700–1600)
IgA: 137 mg/dL (ref 87–352)
IgM, Serum: 60 mg/dL (ref 26–217)
Total Protein ELP: 7.7 g/dL (ref 6.0–8.5)

## 2016-05-11 ENCOUNTER — Ambulatory Visit (HOSPITAL_COMMUNITY): Payer: Medicare Other

## 2016-05-11 ENCOUNTER — Other Ambulatory Visit (HOSPITAL_COMMUNITY): Payer: Medicare Other

## 2016-05-17 DIAGNOSIS — C9000 Multiple myeloma not having achieved remission: Secondary | ICD-10-CM | POA: Diagnosis not present

## 2016-05-17 DIAGNOSIS — Z9484 Stem cells transplant status: Secondary | ICD-10-CM | POA: Diagnosis not present

## 2016-05-17 DIAGNOSIS — Z23 Encounter for immunization: Secondary | ICD-10-CM | POA: Diagnosis not present

## 2016-05-18 ENCOUNTER — Other Ambulatory Visit (HOSPITAL_COMMUNITY): Payer: Medicare Other

## 2016-05-18 ENCOUNTER — Ambulatory Visit (HOSPITAL_COMMUNITY): Payer: Medicare Other

## 2016-05-31 NOTE — Progress Notes (Signed)
[place], [place] [address]   CLINIC:  Medical Oncology/Hematology  PCP:  Mackey Birchwood [address] [place] [address] [phone]   REASON FOR VISIT:  Follow-up for h/o Multiple Myeloma s/p stem cell transplant on 05/07/15  CURRENT THERAPY: Maintenance Ixaxomib (Ninlaro) 3 mg po on Days 1, 8, & 15 beginning 04/17/16 & Xgeva inj monthly   BRIEF ONCOLOGIC HISTORY:    Multiple myeloma ([place])   08/07/2014 Imaging    Bone Survey- No lytic lesions are noted in the visualized skeleton.      09/03/2014 Bone Marrow Biopsy    NORMOCELLULAR BONE MARROW FOR AGE WITH PLASMA CELL NEOPLASM.  The plasma cell component is increased in the marrow representing an estimated 18% of all cells. Cytogenetics with 13q-, 17p- (high risk disease)      09/03/2014 Pathology Results    Cytogenetics with 13q-, 17p- (high risk disease)      09/23/2014 Initial Diagnosis    Multiple myeloma      09/30/2014 PET scan    No abnormal hypermetabolism in the neck, chest, abdomen or pelvis.      10/05/2014 - 03/22/2015 Chemotherapy    RVD      10/28/2014 Treatment Plan Change    Issues related to getting Revlimid in a timely fashion, therefore, she received her Revlimid on 9/21 resulting in a 12 day cycle this time instead of a 14 day cycle      11/02/2014 Imaging    CTA chest- No evidence for a large or central pulmonary embolism as described.  8 mm density along the right minor fissure could represent focal pleural thickening but indeterminate. If the patient is at high risk for bronchogenic carcinoma, follow-up c      11/23/2014 Miscellaneous    Zometa 4 mg IV monthly      04/07/2015 Bone Marrow Biopsy    Normocellular marrow with 2-4% clonal plasma cells by immunohistochemistry. FISH and cytogenetics were normal [facility])        04/2015 Miscellaneous    PRETRANSPLANT EVALUATION:  Pulmonary function tests: FEV1 100.3% / DLCO 97.9%   Echocardiogram: Normal LV function with EF 60-65%       04/27/2015 Procedure    Stem cell mobilization with filgrastim and Mozobil [facility])      05/06/2015 Miscellaneous    BMT conditioning regimen with high-dose Melphalan given [name]); Day -1      05/07/2015 Bone Marrow Transplant    Outpatient autologous stem cell transplant [facility] [name]); Day 0      05/18/2015 Miscellaneous    WBC engraftment;  did not require platelet transfusion during her transplant process. Lowest platelet count 28,000       05/20/2015 Procedure    Tunneled catheter removed [facility])      09/08/2015 -  Chemotherapy    Velcade every 2 weeks      12/15/2015 Miscellaneous    Zometa re-instituted.       12/23/2015 Imaging    Bone density- BMD as determined from Forearm Radius 33% is 0.799 g/cm2 with a T-Score of 1.2. This patient is considered normal according to [facility]) criteria.      04/17/2016 Miscellaneous    Started maintenance with Ninlaro.      05/04/2016 Treatment Plan Change    Zometa switched to Xgeva injection monthly given difficult IV access.         INTERVAL HISTORY:  Ms.Herendeen presents to  the cancer center for routine follow-up.   She continues to tolerate Ninlaro well. She begins her "off week" on Monday, 06/05/16. The last week of the month is always her "off week" from therapy.  Overall, she reports feeling well. Her appetite is great at 100%; energy levels are good at 75%. Denies any new bone pain or arthralgias. No fever, chills, or night sweats. Denies any frank bleeding in her stools, hematuria, dysuria, or vaginal bleeding.  Denies any dizziness or falls.   Does endorse 1 headache episode since her last visit here. Headache was unilateral to right side of head; started after eating a meal. She vomited shortly after and headache resolved without intervention.  Denies any light or sound sensitivity during this time. Denies any  slurred speech, facial drop, or unilateral extremity weakness. She took one of her anti-emetic pills and felt better. No further episodes since that time.  She is unsure what precipitated the headache. Denies eating a meal particularly high in sodium which may have elevated her blood pressure.    She most recently saw her stem cell transplant team at [facility] on 05/17/16.      REVIEW OF SYSTEMS:  Review of Systems  Constitutional: Negative.  Negative for appetite change, chills, fatigue and fever.  HENT:  Negative.  Negative for lump/mass and sore throat.   Eyes: Negative.   Respiratory: Negative.  Negative for cough and shortness of breath.   Cardiovascular: Negative.  Negative for chest pain and palpitations.  Gastrointestinal: Negative.  Negative for abdominal pain, blood in stool, constipation, diarrhea, nausea and vomiting.  Endocrine: Negative.   Genitourinary: Negative.  Negative for dysuria and hematuria.   Musculoskeletal: Negative.  Negative for arthralgias.  Skin: Negative.   Neurological: Positive for headaches. Negative for dizziness.  Hematological: Negative.  Negative for adenopathy. Does not bruise/bleed easily.  Psychiatric/Behavioral: Negative.      PAST MEDICAL/SURGICAL HISTORY:  Past Medical History:  Diagnosis Date  . Anemia   . Claustrophobia 10/05/2014  . Hypertension   . Leukopenia 08/03/2014  . Normocytic hypochromic anemia 08/03/2014   Past Surgical History:  Procedure Laterality Date  . ABDOMINAL HYSTERECTOMY    . OTHER SURGICAL HISTORY     heart surgery as infant to "repair hole in heart"     SOCIAL HISTORY:  Social History   Social History  . Marital status: Legally Separated    Spouse name: N/A  . Number of children: N/A  . Years of education: N/A   Occupational History  . Not on file.   Social History Main Topics  . Smoking status: Never Smoker  . Smokeless tobacco: Never Used  . Alcohol use No  . Drug use: No  . Sexual activity: Not  on file     Comment: divorced- 2 daughters   Other Topics Concern  . Not on file   Social History Narrative  . No narrative on file    FAMILY HISTORY:  Family History  Problem Relation Age of Onset  . Cancer Mother   . Hypertension Mother   . Cancer Father   . Hypertension Father   . Cancer Maternal Grandmother     CURRENT MEDICATIONS:  Outpatient Encounter Prescriptions as of 06/01/2016  Medication Sig  . acyclovir (ZOVIRAX) 800 MG tablet TAKE 1 TABLET BY MOUTH TWICE DAILY  . hydrochlorothiazide (HYDRODIURIL) 25 MG tablet TAKE 1 TABLET BY MOUTH ONCE DAILY.  Marland Kitchen ibuprofen (ADVIL,MOTRIN) 200 MG tablet Take 200 mg by mouth every 6 (six) hours as  needed.  . ixazomib citrate (NINLARO) 3 MG capsule Take 1 capsule (3 mg total) by mouth once a week. Take on Days 1, 8, & 15 every 28 day cycle.  Take on an empty stomach 1hr before or 2hrs after food. Do not crush, chew, or open.  . Multiple Vitamin (TAB-A-VITE) TABS TAKE 1 TABLET BY MOUTH ONCE DAILY.  Marland Kitchen potassium chloride SA (K-DUR,KLOR-CON) 20 MEQ tablet TAKE 1 TABLET BY MOUTH TWICE DAILY  . PROAIR HFA 108 (90 Base) MCG/ACT inhaler Inhale 2 puffs into the lungs daily as needed.   . zolendronic acid (ZOMETA) 4 MG/5ML injection Inject 4 mg into the vein every 28 (twenty-eight) days.   Facility-Administered Encounter Medications as of 06/01/2016  Medication  . heparin lock flush 100 unit/mL  . sodium chloride 0.9 % injection 10 mL    ALLERGIES:  No Known Allergies   PHYSICAL EXAM:  ECOG Performance status: 0  Physical Exam  Constitutional: She is oriented to person, place, and time and well-developed, well-nourished, and in no distress.  HENT:  Head: Normocephalic.  Mouth/Throat: Oropharynx is clear and moist. No oropharyngeal exudate.  Eyes: Conjunctivae are normal. Pupils are equal, round, and reactive to light. No scleral icterus.  Neck: Normal range of motion. Neck supple.  Cardiovascular: Normal rate, regular rhythm and  normal heart sounds.   Pulmonary/Chest: Effort normal and breath sounds normal. No respiratory distress.  Abdominal: Soft. Bowel sounds are normal. There is no tenderness. There is no rebound and no guarding.  Musculoskeletal: Normal range of motion. She exhibits no edema.  Lymphadenopathy:    She has no cervical adenopathy.  Neurological: She is alert and oriented to person, place, and time. No cranial nerve deficit. Gait normal.  Skin: Skin is warm and dry.  Psychiatric: Mood, memory, affect and judgment normal.  Nursing note and vitals reviewed.    LABORATORY DATA:  I have reviewed the labs as listed.  CBC    Component Value Date/Time   WBC 7.7 05/04/2016 1225   RBC 4.12 05/04/2016 1225   HGB 11.9 (L) 05/04/2016 1225   HCT 36.5 05/04/2016 1225   PLT 235 05/04/2016 1225   MCV 88.6 05/04/2016 1225   MCH 28.9 05/04/2016 1225   MCHC 32.6 05/04/2016 1225   RDW 13.9 05/04/2016 1225   LYMPHSABS 2.0 05/04/2016 1225   MONOABS 0.5 05/04/2016 1225   EOSABS 0.2 05/04/2016 1225   BASOSABS 0.0 05/04/2016 1225   CMP Latest Ref Rng & Units 05/04/2016 03/30/2016 03/23/2016  Glucose 65 - 99 mg/dL 104(H) 95 85  BUN 6 - 20 mg/dL 16 20 16   Creatinine 0.44 - 1.00 mg/dL 1.01(H) 0.88 0.85  Sodium 135 - 145 mmol/L 138 138 139  Potassium 3.5 - 5.1 mmol/L 3.7 4.2 4.2  Chloride 101 - 111 mmol/L 101 104 104  CO2 22 - 32 mmol/L 28 27 27   Calcium 8.9 - 10.3 mg/dL 9.3 9.6 9.7  Total Protein 6.5 - 8.1 g/dL 8.5(H) 7.8 7.9  Total Bilirubin 0.3 - 1.2 mg/dL 0.5 0.4 0.5  Alkaline Phos 38 - 126 U/L 54 46 52  AST 15 - 41 U/L 24 17 19   ALT 14 - 54 U/L 14 11(L) 13(L)   Results for JEARLEAN, DEMAURO (MRN [id number])   Ref. Range 05/04/2016 12:25  Beta-2 Microglobulin Latest Ref Range: 0.6 - 2.4 mg/L 3.0 (H)  Total Protein ELP Latest Ref Range: 6.0 - 8.5 g/dL 7.9  Albumin ELP Latest Ref Range: 2.9 - 4.4 g/dL 4.1  Globulin,  Total Latest Ref Range: 2.2 - 3.9 g/dL 3.8  A/G Ratio Latest Ref Range: 0.7 - 1.7   1.1  Alpha-1-Globulin Latest Ref Range: 0.0 - 0.4 g/dL 0.3  Alpha-2-Globulin Latest Ref Range: 0.4 - 1.0 g/dL 0.9  Beta Globulin Latest Ref Range: 0.7 - 1.3 g/dL 1.2  Gamma Globulin Latest Ref Range: 0.4 - 1.8 g/dL 1.5  M-SPIKE, % Latest Ref Range: Not Observed g/dL 0.3 (H)  SPE Interp. Unknown Comment  Comment Unknown Comment  IgG (Immunoglobin G), Serum Latest Ref Range: 700 - 1,600 mg/dL 1,396  IgA Latest Ref Range: 87 - 352 mg/dL 133  IgM, Serum Latest Ref Range: 26 - 217 mg/dL 59  Kappa free light chain Latest Ref Range: 3.3 - 19.4 mg/L 25.5 (H)  Lamda free light chains Latest Ref Range: 5.7 - 26.3 mg/L 14.4  Kappa, lamda light chain ratio Latest Ref Range: 0.26 - 1.65  1.77 (H)   Results for CHERYLYNN, LISZEWSKI (MRN [id number])   Ref. Range 05/04/2016 12:25  IgG (Immunoglobin G), Serum Latest Ref Range: 700 - 1,600 mg/dL 1,396  IgA Latest Ref Range: 87 - 352 mg/dL 133  Immunofixation Result, Serum Unknown   Alpha-1-Globulin Latest Ref Range: 0.0 - 0.4 g/dL 0.3  Alpha-2-Globulin Latest Ref Range: 0.4 - 1.0 g/dL 0.9         PENDING LABS:  Myeloma labs pending for today   DIAGNOSTIC IMAGING:  DEXA scan: 12/23/15   PATHOLOGY:     ASSESSMENT & PLAN:  Ms. Linskey is a very pleasant 61 y.o. female with history of Multiple Myeloma with high-risk features (13q-, 17p-); treated with RVD therapy, then stem cell transplant at [facility] on 05/07/15.  She was on maintenance Velcade every 2 weeks, which was restarted after transplant on 09/08/15, with plans to continue to a total of 2 years. She was also receiving Zometa monthly. In early 04/2016, trending increase in kappa light chain values with slow increase in M-spike as well. Discussed with Dr. Norma Fredrickson at [facility] and he recommended increasing frequency of Velcade scheduling vs try to get Ixaxomib Kennieth Rad) approved by insurance, if able, in lieu of Velcade.  Ninlaro approved by insurance and started on 04/17/16.     Multiple myeloma s/p stem cell transplant:  -Based on Dr. Rossie Muskrat recommendations, patient was started on Ninlaro 3 mg on Days 1, 8, & 15 every 28 days; Ninlaro started 04/17/16.  -Most recent myeloma labs on 05/04/16 with slight improvement in M-spike at 0.3 (previously 0.4 on 03/30/16). Kappa free light chains and kappa/lambda light chain ratio remain elevated at 25.5 and 1.77 respectively.  -Will await today's myeloma labs for further treatment planning.  -Continue Ninlaro at current dose/schedule for now. May need to discuss any additional treatment plan changes with Dr. Norma Fredrickson based on myeloma lab results today. -Follow-up with Dr. Norma Fredrickson as directed. -Return to cancer center in 1 month with repeat myeloma lab evaluation and next Xgeva injection.   Bone health:  -IV access is difficult. Therefore Zometa switched to Xgeva monthly.  -Labs reviewed and calcium adequate for Xgeva injection today.  -Continue Xgeva monthly to help prevent skeletal related events.   Headache:  -Based on patient's reported symptoms, I have high suspicion the one headache episode was a migraine without aura. Strongly recommended she call us if the headache recurs, and may consider imaging for further evaluation given oncologic history. She agrees with this plan.   Health maintenance/Wellness promotion:  -Encouraged healthy diet and exercise. She has  completed [employer] program through [facility] in the past; encouraged her to pull out those materials and start slow increasing her physical activity.  -Maintain follow-up with PCP, as directed.     Dispo:  -Continue Ninlaro as directed.  -Return to cancer center in 1 month for follow-up, Xgeva injections, and myeloma labs.    All questions were answered to patient's stated satisfaction. Encouraged patient to call with any new concerns or questions before her next visit to the cancer center and we can certain see her sooner, if needed.    Plan of care  discussed with Dr. Twana First, who agrees with the above aforementioned.    Mike Craze, NP [place] [phone]

## 2016-06-01 ENCOUNTER — Ambulatory Visit (HOSPITAL_COMMUNITY): Payer: Medicare Other

## 2016-06-01 ENCOUNTER — Encounter (HOSPITAL_COMMUNITY): Payer: Medicare Other

## 2016-06-01 ENCOUNTER — Encounter (HOSPITAL_COMMUNITY): Payer: Medicare Other | Attending: Adult Health | Admitting: Adult Health

## 2016-06-01 ENCOUNTER — Encounter (HOSPITAL_COMMUNITY): Payer: Self-pay | Admitting: Adult Health

## 2016-06-01 ENCOUNTER — Other Ambulatory Visit (HOSPITAL_COMMUNITY): Payer: Medicare Other

## 2016-06-01 ENCOUNTER — Encounter (HOSPITAL_BASED_OUTPATIENT_CLINIC_OR_DEPARTMENT_OTHER): Payer: Medicare Other

## 2016-06-01 VITALS — BP 154/89 | HR 96 | Temp 98.9°F | Resp 20 | Wt 296.2 lb

## 2016-06-01 DIAGNOSIS — E559 Vitamin D deficiency, unspecified: Secondary | ICD-10-CM | POA: Diagnosis not present

## 2016-06-01 DIAGNOSIS — Z9484 Stem cells transplant status: Secondary | ICD-10-CM

## 2016-06-01 DIAGNOSIS — C9001 Multiple myeloma in remission: Secondary | ICD-10-CM | POA: Diagnosis not present

## 2016-06-01 DIAGNOSIS — I10 Essential (primary) hypertension: Secondary | ICD-10-CM | POA: Diagnosis not present

## 2016-06-01 DIAGNOSIS — C9000 Multiple myeloma not having achieved remission: Secondary | ICD-10-CM | POA: Diagnosis not present

## 2016-06-01 DIAGNOSIS — M170 Bilateral primary osteoarthritis of knee: Secondary | ICD-10-CM | POA: Diagnosis not present

## 2016-06-01 DIAGNOSIS — Z0000 Encounter for general adult medical examination without abnormal findings: Secondary | ICD-10-CM | POA: Diagnosis not present

## 2016-06-01 DIAGNOSIS — J4530 Mild persistent asthma, uncomplicated: Secondary | ICD-10-CM | POA: Diagnosis not present

## 2016-06-01 LAB — CBC WITH DIFFERENTIAL/PLATELET
BASOS PCT: 0 %
Basophils Absolute: 0 10*3/uL (ref 0.0–0.1)
Eosinophils Absolute: 0.2 10*3/uL (ref 0.0–0.7)
Eosinophils Relative: 3 %
HEMATOCRIT: 35.5 % — AB (ref 36.0–46.0)
Hemoglobin: 11.8 g/dL — ABNORMAL LOW (ref 12.0–15.0)
Lymphocytes Relative: 27 %
Lymphs Abs: 1.7 10*3/uL (ref 0.7–4.0)
MCH: 29.6 pg (ref 26.0–34.0)
MCHC: 33.2 g/dL (ref 30.0–36.0)
MCV: 89.2 fL (ref 78.0–100.0)
MONOS PCT: 8 %
Monocytes Absolute: 0.5 10*3/uL (ref 0.1–1.0)
NEUTROS ABS: 3.9 10*3/uL (ref 1.7–7.7)
Neutrophils Relative %: 62 %
Platelets: 195 10*3/uL (ref 150–400)
RBC: 3.98 MIL/uL (ref 3.87–5.11)
RDW: 14.4 % (ref 11.5–15.5)
WBC: 6.2 10*3/uL (ref 4.0–10.5)

## 2016-06-01 LAB — COMPREHENSIVE METABOLIC PANEL
ALBUMIN: 4.2 g/dL (ref 3.5–5.0)
ALK PHOS: 52 U/L (ref 38–126)
ALT: 13 U/L — ABNORMAL LOW (ref 14–54)
ANION GAP: 8 (ref 5–15)
AST: 24 U/L (ref 15–41)
BUN: 17 mg/dL (ref 6–20)
CO2: 27 mmol/L (ref 22–32)
CREATININE: 1.06 mg/dL — AB (ref 0.44–1.00)
Calcium: 9.6 mg/dL (ref 8.9–10.3)
Chloride: 104 mmol/L (ref 101–111)
GFR calc Af Amer: >60 mL/min (ref >60)
GFR calc non Af Amer: 56 mL/min — ABNORMAL LOW (ref >60)
GLUCOSE: 117 mg/dL — AB (ref 65–99)
Potassium: 3.8 mmol/L (ref 3.5–5.1)
Sodium: 139 mmol/L (ref 135–145)
TOTAL PROTEIN: 7.9 g/dL (ref 6.5–8.1)
Total Bilirubin: 0.3 mg/dL (ref 0.3–1.2)

## 2016-06-01 LAB — LACTATE DEHYDROGENASE: LDH: 175 U/L (ref 98–192)

## 2016-06-01 MED ORDER — DENOSUMAB 120 MG/1.7ML ~~LOC~~ SOLN
120.0000 mg | Freq: Once | SUBCUTANEOUS | Status: AC
  Administered 2016-06-01: 120 mg via SUBCUTANEOUS
  Filled 2016-06-01: qty 1.7

## 2016-06-01 NOTE — Progress Notes (Signed)
Kelli Hurley presents today for injection per MD orders. Xgeva 120 mg administered SQ in left Abdomen. Administration without incident. Patient tolerated well. Stable and ambulatory on discharge home to self.

## 2016-06-01 NOTE — Progress Notes (Signed)
Pt is taking ninlaro as prescribed.  No problems to report.

## 2016-06-01 NOTE — Patient Instructions (Addendum)
[place] at [facility] Discharge Instructions  RECOMMENDATIONS MADE BY THE CONSULTANT AND ANY TEST RESULTS WILL BE SENT TO YOUR REFERRING PHYSICIAN.  You were seen today by Mike Craze NP. Return in 28 days for labs, Xgeva and follow up.    Thank you for choosing [place] at [facility] to provide your oncology and hematology care.  To afford each patient quality time with our provider, please arrive at least 15 minutes before your scheduled appointment time.    If you have a lab appointment with the [facility] please come in thru the  Main Entrance and check in at the main information desk  You need to re-schedule your appointment should you arrive 10 or more minutes late.  We strive to give you quality time with our providers, and arriving late affects you and other patients whose appointments are after yours.  Also, if you no show three or more times for appointments you may be dismissed from the clinic at the providers discretion.     Again, thank you for choosing [facility]  Our hope is that these requests will decrease the amount of time that you wait before being seen by our physicians.       _____________________________________________________________  Should you have questions after your visit to [facility], please contact our office at [phone] between the hours of 8:30 a.m. and 4:30 p.m.  Voicemails left after 4:30 p.m. will not be returned until the following business day.  For prescription refill requests, have your pharmacy contact our office.       Resources For Cancer Patients and their Caregivers ? American Cancer Society: Can assist with transportation, wigs, general needs, runs Look Good Feel Better.        603-495-2747 ? Cancer Care: Provides financial assistance, online support groups, medication/co-pay assistance.  1-800-813-HOPE 579-745-3353) ? [facility] Assists [place] cancer patients and their families through emotional , educational and financial support.  315-340-0384 ? [name] Where to apply for food stamps, Medicaid and utility assistance. 7245425981 ? [name]: Transportation to medical appointments. 832-333-0992 ? Social Security Administration: May apply for disability if have a Stage IV cancer. (251) 336-1682 972-335-4814 ? [name], Disability and Transit Services: Assists with nutrition, care and transit needs. [place] Support Programs: @10RELATIVEDAYS @ > Cancer Support Group  2nd Tuesday of the month 1pm-2pm, Journey Room  > Creative Journey  3rd Tuesday of the month 1130am-1pm, Journey Room  > Look Good Feel Better  1st Wednesday of the month 10am-12 noon, Journey Room (Call [place] to register [phone])

## 2016-06-02 ENCOUNTER — Telehealth (HOSPITAL_COMMUNITY): Payer: Self-pay | Admitting: Oncology

## 2016-06-02 LAB — PROTEIN ELECTROPHORESIS, SERUM
A/G RATIO SPE: 1.2 (ref 0.7–1.7)
ALBUMIN ELP: 4.1 g/dL (ref 2.9–4.4)
ALPHA-1-GLOBULIN: 0.2 g/dL (ref 0.0–0.4)
Alpha-2-Globulin: 0.7 g/dL (ref 0.4–1.0)
Beta Globulin: 1.1 g/dL (ref 0.7–1.3)
GLOBULIN, TOTAL: 3.4 g/dL (ref 2.2–3.9)
Gamma Globulin: 1.4 g/dL (ref 0.4–1.8)
M-Spike, %: 0.3 g/dL — ABNORMAL HIGH
TOTAL PROTEIN ELP: 7.5 g/dL (ref 6.0–8.5)

## 2016-06-02 LAB — KAPPA/LAMBDA LIGHT CHAINS
KAPPA FREE LGHT CHN: 22.6 mg/L — AB (ref 3.3–19.4)
Kappa, lambda light chain ratio: 1.75 — ABNORMAL HIGH (ref 0.26–1.65)
Lambda free light chains: 12.9 mg/L (ref 5.7–26.3)

## 2016-06-02 LAB — BETA 2 MICROGLOBULIN, SERUM: BETA 2 MICROGLOBULIN: 3.2 mg/L — AB (ref 0.6–2.4)

## 2016-06-02 NOTE — Telephone Encounter (Signed)
FAXED ASSIST APP TO CANCER CARE

## 2016-06-05 ENCOUNTER — Other Ambulatory Visit (HOSPITAL_COMMUNITY): Payer: Self-pay | Admitting: Oncology

## 2016-06-05 DIAGNOSIS — C9000 Multiple myeloma not having achieved remission: Secondary | ICD-10-CM

## 2016-06-05 MED ORDER — IXAZOMIB CITRATE 3 MG PO CAPS: 3.0000 mg | ORAL_CAPSULE | ORAL | 0 refills | Status: DC

## 2016-06-13 ENCOUNTER — Other Ambulatory Visit (HOSPITAL_COMMUNITY): Payer: Self-pay | Admitting: Oncology

## 2016-06-13 DIAGNOSIS — E876 Hypokalemia: Secondary | ICD-10-CM

## 2016-06-20 ENCOUNTER — Other Ambulatory Visit (HOSPITAL_COMMUNITY): Payer: Self-pay | Admitting: Oncology

## 2016-06-20 DIAGNOSIS — C9000 Multiple myeloma not having achieved remission: Secondary | ICD-10-CM

## 2016-06-28 ENCOUNTER — Other Ambulatory Visit (HOSPITAL_COMMUNITY): Payer: Self-pay | Admitting: Oncology

## 2016-06-28 DIAGNOSIS — C9000 Multiple myeloma not having achieved remission: Secondary | ICD-10-CM

## 2016-06-29 ENCOUNTER — Encounter (HOSPITAL_COMMUNITY): Payer: Self-pay | Admitting: Adult Health

## 2016-06-29 ENCOUNTER — Encounter (HOSPITAL_COMMUNITY): Payer: Medicare Other | Attending: Oncology

## 2016-06-29 ENCOUNTER — Encounter (HOSPITAL_BASED_OUTPATIENT_CLINIC_OR_DEPARTMENT_OTHER): Payer: Medicare Other

## 2016-06-29 ENCOUNTER — Encounter (HOSPITAL_BASED_OUTPATIENT_CLINIC_OR_DEPARTMENT_OTHER): Payer: Medicare Other | Admitting: Adult Health

## 2016-06-29 DIAGNOSIS — C9001 Multiple myeloma in remission: Secondary | ICD-10-CM

## 2016-06-29 DIAGNOSIS — C9000 Multiple myeloma not having achieved remission: Secondary | ICD-10-CM

## 2016-06-29 DIAGNOSIS — R51 Headache: Secondary | ICD-10-CM | POA: Insufficient documentation

## 2016-06-29 DIAGNOSIS — M25562 Pain in left knee: Secondary | ICD-10-CM | POA: Diagnosis not present

## 2016-06-29 DIAGNOSIS — M25561 Pain in right knee: Secondary | ICD-10-CM | POA: Insufficient documentation

## 2016-06-29 DIAGNOSIS — Z79899 Other long term (current) drug therapy: Secondary | ICD-10-CM | POA: Diagnosis not present

## 2016-06-29 LAB — COMPREHENSIVE METABOLIC PANEL
ALT: 13 U/L — ABNORMAL LOW (ref 14–54)
AST: 21 U/L (ref 15–41)
Albumin: 4.3 g/dL (ref 3.5–5.0)
Alkaline Phosphatase: 52 U/L (ref 38–126)
Anion gap: 7 (ref 5–15)
BILIRUBIN TOTAL: 0.5 mg/dL (ref 0.3–1.2)
BUN: 11 mg/dL (ref 6–20)
CO2: 29 mmol/L (ref 22–32)
CREATININE: 0.93 mg/dL (ref 0.44–1.00)
Calcium: 9.5 mg/dL (ref 8.9–10.3)
Chloride: 104 mmol/L (ref 101–111)
GFR calc Af Amer: >60 mL/min (ref >60)
GLUCOSE: 106 mg/dL — AB (ref 65–99)
Potassium: 4 mmol/L (ref 3.5–5.1)
Sodium: 140 mmol/L (ref 135–145)
TOTAL PROTEIN: 8 g/dL (ref 6.5–8.1)

## 2016-06-29 LAB — CBC WITH DIFFERENTIAL/PLATELET
BASOS PCT: 0 %
Basophils Absolute: 0 10*3/uL (ref 0.0–0.1)
Eosinophils Absolute: 0.1 10*3/uL (ref 0.0–0.7)
Eosinophils Relative: 1 %
HEMATOCRIT: 36.9 % (ref 36.0–46.0)
Hemoglobin: 11.9 g/dL — ABNORMAL LOW (ref 12.0–15.0)
LYMPHS PCT: 27 %
Lymphs Abs: 1.5 10*3/uL (ref 0.7–4.0)
MCH: 28.9 pg (ref 26.0–34.0)
MCHC: 32.2 g/dL (ref 30.0–36.0)
MCV: 89.6 fL (ref 78.0–100.0)
MONO ABS: 0.5 10*3/uL (ref 0.1–1.0)
MONOS PCT: 9 %
NEUTROS ABS: 3.4 10*3/uL (ref 1.7–7.7)
Neutrophils Relative %: 63 %
Platelets: 179 10*3/uL (ref 150–400)
RBC: 4.12 MIL/uL (ref 3.87–5.11)
RDW: 14.1 % (ref 11.5–15.5)
WBC: 5.3 10*3/uL (ref 4.0–10.5)

## 2016-06-29 LAB — MAGNESIUM: MAGNESIUM: 2 mg/dL (ref 1.7–2.4)

## 2016-06-29 LAB — LACTATE DEHYDROGENASE: LDH: 164 U/L (ref 98–192)

## 2016-06-29 MED ORDER — DENOSUMAB 120 MG/1.7ML ~~LOC~~ SOLN
120.0000 mg | Freq: Once | SUBCUTANEOUS | Status: AC
  Administered 2016-06-29: 120 mg via SUBCUTANEOUS
  Filled 2016-06-29: qty 1.7

## 2016-06-29 MED ORDER — IXAZOMIB CITRATE 3 MG PO CAPS: ORAL_CAPSULE | ORAL | 0 refills | Status: DC

## 2016-06-29 NOTE — Progress Notes (Signed)
Kelli Hurley presents today for injection per MD orders. Xgeva 120 mg administered SQ in right Abdomen. Administration without incident. Patient tolerated well. Stable and ambulatory on discharge home to self.

## 2016-06-29 NOTE — Patient Instructions (Signed)
[facility] at [facility] Discharge Instructions  RECOMMENDATIONS MADE BY THE CONSULTANT AND ANY TEST RESULTS WILL BE SENT TO YOUR REFERRING PHYSICIAN.  You were seen today by Gretchen Dawson NP.   Thank you for choosing [facility] at [facility] to provide your oncology and hematology care.  To afford each patient quality time with our provider, please arrive at least 15 minutes before your scheduled appointment time.    If you have a lab appointment with the Cancer Center please come in thru the  Main Entrance and check in at the main information desk  You need to re-schedule your appointment should you arrive 10 or more minutes late.  We strive to give you quality time with our providers, and arriving late affects you and other patients whose appointments are after yours.  Also, if you no show three or more times for appointments you may be dismissed from the clinic at the providers discretion.     Again, thank you for choosing [facility].  Our hope is that these requests will decrease the amount of time that you wait before being seen by our physicians.       _____________________________________________________________  Should you have questions after your visit to [facility], please contact our office at [phone] between the hours of 8:30 a.m. and 4:30 p.m.  Voicemails left after 4:30 p.m. will not be returned until the following business day.  For prescription refill requests, have your pharmacy contact our office.       Resources For Cancer Patients and their Caregivers ? American Cancer Society: Can assist with transportation, wigs, general needs, runs Look Good Feel Better.        1-888-227-6333 ? Cancer Care: Provides financial assistance, online support groups, medication/co-pay assistance.  1-800-813-HOPE (4673) ? [facility] Assists [place] cancer patients and  their families through emotional , educational and financial support.  336-427-4357 ? [name] Where to apply for food stamps, Medicaid and utility assistance. 336-342-1394 ? [name]: Transportation to medical appointments. 336-347-2287 ? Social Security Administration: May apply for disability if have a Stage IV cancer. 336-342-7796 1-800-772-1213 ? [place] Aging, Disability and Transit Services: Assists with nutrition, care and transit needs. 336-349-2343  Cancer Center Support Programs: @10RELATIVEDAYS@ > Cancer Support Group  2nd Tuesday of the month 1pm-2pm, Journey Room  > Creative Journey  3rd Tuesday of the month 1130am-1pm, Journey Room  > Look Good Feel Better  1st Wednesday of the month 10am-12 noon, Journey Room (Call American Cancer Society to register 1-800-395-5775)    

## 2016-06-29 NOTE — Progress Notes (Signed)
[place], [place] [address]   CLINIC:  Medical Oncology/Hematology  PCP:  Mackey Birchwood [address] [place] [address] [phone]   REASON FOR VISIT:  Follow-up for h/o Multiple Myeloma s/p stem cell transplant on 05/07/15  CURRENT THERAPY: Maintenance Ixaxomib (Ninlaro) 3 mg po on Days 1, 8, & 15 beginning 04/17/16 & Xgeva inj monthly   BRIEF ONCOLOGIC HISTORY:    Multiple myeloma ([place])   08/07/2014 Imaging    Bone Survey- No lytic lesions are noted in the visualized skeleton.      09/03/2014 Bone Marrow Biopsy    NORMOCELLULAR BONE MARROW FOR AGE WITH PLASMA CELL NEOPLASM.  The plasma cell component is increased in the marrow representing an estimated 18% of all cells. Cytogenetics with 13q-, 17p- (high risk disease)      09/03/2014 Pathology Results    Cytogenetics with 13q-, 17p- (high risk disease)      09/23/2014 Initial Diagnosis    Multiple myeloma      09/30/2014 PET scan    No abnormal hypermetabolism in the neck, chest, abdomen or pelvis.      10/05/2014 - 03/22/2015 Chemotherapy    RVD      10/28/2014 Treatment Plan Change    Issues related to getting Revlimid in a timely fashion, therefore, she received her Revlimid on 9/21 resulting in a 12 day cycle this time instead of a 14 day cycle      11/02/2014 Imaging    CTA chest- No evidence for a large or central pulmonary embolism as described.  8 mm density along the right minor fissure could represent focal pleural thickening but indeterminate. If the patient is at high risk for bronchogenic carcinoma, follow-up c      11/23/2014 Miscellaneous    Zometa 4 mg IV monthly      04/07/2015 Bone Marrow Biopsy    Normocellular marrow with 2-4% clonal plasma cells by immunohistochemistry. FISH and cytogenetics were normal [facility])        04/2015 Miscellaneous    PRETRANSPLANT EVALUATION:  Pulmonary function tests: FEV1 100.3% / DLCO 97.9%   Echocardiogram: Normal LV function with EF 60-65%       04/27/2015 Procedure    Stem cell mobilization with filgrastim and Mozobil [facility])      05/06/2015 Miscellaneous    BMT conditioning regimen with high-dose Melphalan given [facility] [name]); Day -1      05/07/2015 Bone Marrow Transplant    Outpatient autologous stem cell transplant [facility] [name]); Day 0      05/18/2015 Miscellaneous    WBC engraftment;  did not require platelet transfusion during her transplant process. Lowest platelet count 28,000       05/20/2015 Procedure    Tunneled catheter removed [facility])      09/08/2015 -  Chemotherapy    Velcade every 2 weeks      12/15/2015 Miscellaneous    Zometa re-instituted.       12/23/2015 Imaging    Bone density- BMD as determined from [facility] 33% is 0.799 g/cm2 with a T-Score of 1.2. This patient is considered normal according to [facility]) criteria.      04/17/2016 Miscellaneous    Started maintenance with Ninlaro.      05/04/2016 Treatment Plan Change    Zometa switched to Xgeva injection monthly given difficult IV access.         INTERVAL HISTORY:  Ms.Chancy presents to  the cancer center for routine follow-up and Xgeva injection.   She feels very well. Continues to tolerate the Ninlaro well with largely no side effects. The takes the [facility] on Mondays and will start her "off week" next week.  She is requesting refill of Ninlaro today for next cycle.  Appetite and energy levels are great. She continues to struggle with pain in her knees, but this has been chronic and is largely unchanged. No new bone pain or arthralgias.  Denies fever, chills, or night sweats.   At her last visit, she reported unilateral headache. She reports no subsequent headaches since her last visit.   Overall, she is largely without complaints today. She is due to see Dr. Norma Fredrickson again in 11/2016.     REVIEW OF SYSTEMS:  Review of  Systems  Constitutional: Negative.  Negative for chills, fatigue and fever.  HENT:  Negative.  Negative for lump/mass and nosebleeds.   Eyes: Negative.   Respiratory: Negative.  Negative for cough and shortness of breath.   Cardiovascular: Negative.  Negative for chest pain and leg swelling.  Gastrointestinal: Negative.  Negative for abdominal pain, blood in stool, constipation, diarrhea, nausea and vomiting.  Endocrine: Negative.   Genitourinary: Negative.  Negative for dysuria and hematuria.   Musculoskeletal: Positive for arthralgias.  Skin: Negative.  Negative for rash.  Neurological: Negative.  Negative for dizziness and headaches.  Hematological: Negative.  Negative for adenopathy. Does not bruise/bleed easily.  Psychiatric/Behavioral: Negative.  Negative for depression and sleep disturbance. The patient is not nervous/anxious.      PAST MEDICAL/SURGICAL HISTORY:  Past Medical History:  Diagnosis Date  . Anemia   . Claustrophobia 10/05/2014  . Hypertension   . Leukopenia 08/03/2014  . Normocytic hypochromic anemia 08/03/2014   Past Surgical History:  Procedure Laterality Date  . ABDOMINAL HYSTERECTOMY    . OTHER SURGICAL HISTORY     heart surgery as infant to "repair hole in heart"     SOCIAL HISTORY:  Social History   Social History  . Marital status: Legally Separated    Spouse name: N/A  . Number of children: N/A  . Years of education: N/A   Occupational History  . Not on file.   Social History Main Topics  . Smoking status: Never Smoker  . Smokeless tobacco: Never Used  . Alcohol use No  . Drug use: No  . Sexual activity: Not on file     Comment: divorced- 2 daughters   Other Topics Concern  . Not on file   Social History Narrative  . No narrative on file    FAMILY HISTORY:  Family History  Problem Relation Age of Onset  . Cancer Mother   . Hypertension Mother   . Cancer Father   . Hypertension Father   . Cancer Maternal Grandmother      CURRENT MEDICATIONS:  Outpatient Encounter Prescriptions as of 06/29/2016  Medication Sig  . acyclovir (ZOVIRAX) 800 MG tablet TAKE 1 TABLET BY MOUTH TWICE DAILY  . hydrochlorothiazide (HYDRODIURIL) 25 MG tablet TAKE 1 TABLET BY MOUTH ONCE DAILY.  Marland Kitchen ibuprofen (ADVIL,MOTRIN) 200 MG tablet Take 200 mg by mouth every 6 (six) hours as needed.  . Multiple Vitamin (TAB-A-VITE) TABS TAKE 1 TABLET BY MOUTH ONCE DAILY.  Kennieth Rad 3 MG capsule Take 1 capsule (42m) by mouth once a week on days 1, 8 & 15 every 28 day cycle Take on empty stomach 1 hr before or 2 hrs after food  . potassium chloride  SA (K-DUR,KLOR-CON) 20 MEQ tablet TAKE 1 TABLET BY MOUTH TWICE DAILY  . PROAIR HFA 108 (90 Base) MCG/ACT inhaler Inhale 2 puffs into the lungs daily as needed.   . Vitamin D, Ergocalciferol, (DRISDOL) 50000 units CAPS capsule Take by mouth.  . zolendronic acid (ZOMETA) 4 MG/5ML injection Inject 4 mg into the vein every 28 (twenty-eight) days.   Facility-Administered Encounter Medications as of 06/29/2016  Medication  . heparin lock flush 100 unit/mL  . sodium chloride 0.9 % injection 10 mL    ALLERGIES:  No Known Allergies   PHYSICAL EXAM:  ECOG Performance status: 0 - Asymptomatic   Physical Exam  Constitutional: She is oriented to person, place, and time and well-developed, well-nourished, and in no distress.  HENT:  Head: Normocephalic.  Mouth/Throat: Oropharynx is clear and moist. No oropharyngeal exudate.  Eyes: Conjunctivae are normal. Pupils are equal, round, and reactive to light. No scleral icterus.  Neck: Normal range of motion. Neck supple.  Cardiovascular: Normal rate, regular rhythm and normal heart sounds.   Pulmonary/Chest: Effort normal and breath sounds normal. No respiratory distress. She has no wheezes. She has no rales.  Abdominal: Soft. Bowel sounds are normal. There is no tenderness.  Musculoskeletal: Normal range of motion. She exhibits no edema.  Lymphadenopathy:    She  has no cervical adenopathy.       Right: No supraclavicular adenopathy present.       Left: No supraclavicular adenopathy present.  Neurological: She is alert and oriented to person, place, and time. No cranial nerve deficit. Gait normal.  Skin: Skin is warm and dry. No rash noted.  Psychiatric: Mood, memory, affect and judgment normal.  Nursing note and vitals reviewed.    LABORATORY DATA:  I have reviewed the labs as listed.  CBC    Component Value Date/Time   WBC 6.2 06/01/2016 1304   RBC 3.98 06/01/2016 1304   HGB 11.8 (L) 06/01/2016 1304   HCT 35.5 (L) 06/01/2016 1304   PLT 195 06/01/2016 1304   MCV 89.2 06/01/2016 1304   MCH 29.6 06/01/2016 1304   MCHC 33.2 06/01/2016 1304   RDW 14.4 06/01/2016 1304   LYMPHSABS 1.7 06/01/2016 1304   MONOABS 0.5 06/01/2016 1304   EOSABS 0.2 06/01/2016 1304   BASOSABS 0.0 06/01/2016 1304   CMP Latest Ref Rng & Units 06/01/2016 05/04/2016 03/30/2016  Glucose 65 - 99 mg/dL 117(H) 104(H) 95  BUN 6 - 20 mg/dL 17 16 20   Creatinine 0.44 - 1.00 mg/dL 1.06(H) 1.01(H) 0.88  Sodium 135 - 145 mmol/L 139 138 138  Potassium 3.5 - 5.1 mmol/L 3.8 3.7 4.2  Chloride 101 - 111 mmol/L 104 101 104  CO2 22 - 32 mmol/L 27 28 27   Calcium 8.9 - 10.3 mg/dL 9.6 9.3 9.6  Total Protein 6.5 - 8.1 g/dL 7.9 8.5(H) 7.8  Total Bilirubin 0.3 - 1.2 mg/dL 0.3 0.5 0.4  Alkaline Phos 38 - 126 U/L 52 54 46  AST 15 - 41 U/L 24 24 17   ALT 14 - 54 U/L 13(L) 14 11(L)     PENDING LABS:  Myeloma labs pending for today     DIAGNOSTIC IMAGING:  DEXA scan: 12/23/15   PATHOLOGY:     ASSESSMENT & PLAN:  Ms. Nyquist is a very pleasant 61 y.o. female with history of Multiple Myeloma with high-risk features (13q-, 17p-); treated with RVD therapy, then stem cell transplant at [facility] on 05/07/15.  She was on maintenance Velcade every 2  weeks, which was restarted after transplant on 09/08/15, with plans to continue to a total of 2 years. She was also  receiving Zometa monthly. In early 04/2016, trending increase in kappa light chain values with slow increase in M-spike as well. Discussed with Dr. Norma Fredrickson at [facility] and he recommended increasing frequency of Velcade scheduling vs try to get Ixaxomib Kennieth Rad) approved by insurance, if able, in lieu of Velcade.  Ninlaro approved by insurance and started on 04/17/16.    Multiple myeloma s/p stem cell transplant:  -Based on Dr. Rossie Muskrat recommendations, patient was started on Ninlaro 3 mg on Days 1, 8, & 15 every 28 days; Ninlaro started 04/17/16.  -Most recent myeloma labs on 06/01/16 are stable. M-spike remains 0.3. Kappa free light chains and kappa/lambda light chain ratio remain elevated at 22.6 and 1.75 respectively.  -Will await today's myeloma labs for further treatment planning, if appropriate.   -Continue Ninlaro at current dose/schedule. Ninlaro prescription printed and given to Angie, our patient advocate, to be faxed to specialty pharmacy.  -Follow-up with Dr. Norma Fredrickson as directed. She is due to see him again in October per her report.  -Return to cancer center in 2 month with repeat myeloma lab evaluation and next Xgeva injection.   Bone health:  -Zometa switched to monthly Xgeva given difficult IV access.  -Labs reviewed today and calcium adequate for Xgeva injection today.  -Continue Xgeva monthly to help prevent skeletal related events. -She will be due for repeat DEXA imaging in 12/2017.    Headache:  -Resolved.       Dispo:  -Continue Ninlaro as directed.  -Continue monthly Xgeva injection and myeloma labs (standing orders already in place).  -Return to cancer center in 2 months for follow-up.   All questions were answered to patient's stated satisfaction. Encouraged patient to call with any new concerns or questions before her next visit to the cancer center and we can certain see her sooner, if needed.    Plan of care discussed with Dr. Twana First, who agrees with  the above aforementioned.    Orders placed this encounter:  No orders of the defined types were placed in this encounter.     Mike Craze, NP [place] [phone]

## 2016-06-30 LAB — IGG, IGA, IGM
IgA: 116 mg/dL (ref 87–352)
IgG (Immunoglobin G), Serum: 1373 mg/dL (ref 700–1600)
IgM, Serum: 69 mg/dL (ref 26–217)

## 2016-06-30 LAB — BETA 2 MICROGLOBULIN, SERUM: Beta-2 Microglobulin: 3.2 mg/L — ABNORMAL HIGH (ref 0.6–2.4)

## 2016-06-30 LAB — KAPPA/LAMBDA LIGHT CHAINS
KAPPA FREE LGHT CHN: 19.5 mg/L — AB (ref 3.3–19.4)
Kappa, lambda light chain ratio: 1.51 (ref 0.26–1.65)
Lambda free light chains: 12.9 mg/L (ref 5.7–26.3)

## 2016-07-03 LAB — IMMUNOFIXATION ELECTROPHORESIS
IGA: 112 mg/dL (ref 87–352)
IGG (IMMUNOGLOBIN G), SERUM: 1383 mg/dL (ref 700–1600)
IGM, SERUM: 71 mg/dL (ref 26–217)
Total Protein ELP: 7.8 g/dL (ref 6.0–8.5)

## 2016-07-04 LAB — PROTEIN ELECTROPHORESIS, SERUM
A/G Ratio: 1 (ref 0.7–1.7)
ALBUMIN ELP: 3.7 g/dL (ref 2.9–4.4)
ALPHA-2-GLOBULIN: 0.8 g/dL (ref 0.4–1.0)
Alpha-1-Globulin: 0.3 g/dL (ref 0.0–0.4)
Beta Globulin: 1.2 g/dL (ref 0.7–1.3)
GAMMA GLOBULIN: 1.6 g/dL (ref 0.4–1.8)
GLOBULIN, TOTAL: 3.8 g/dL (ref 2.2–3.9)
M-Spike, %: 0.3 g/dL — ABNORMAL HIGH
Total Protein ELP: 7.5 g/dL (ref 6.0–8.5)

## 2016-07-24 ENCOUNTER — Other Ambulatory Visit (HOSPITAL_COMMUNITY): Payer: Self-pay | Admitting: Oncology

## 2016-07-24 DIAGNOSIS — C9000 Multiple myeloma not having achieved remission: Secondary | ICD-10-CM

## 2016-07-24 DIAGNOSIS — Z79899 Other long term (current) drug therapy: Secondary | ICD-10-CM

## 2016-07-27 ENCOUNTER — Encounter (HOSPITAL_COMMUNITY): Payer: Self-pay

## 2016-07-27 ENCOUNTER — Encounter (HOSPITAL_COMMUNITY): Payer: Medicare Other | Attending: Oncology

## 2016-07-27 ENCOUNTER — Encounter (HOSPITAL_BASED_OUTPATIENT_CLINIC_OR_DEPARTMENT_OTHER): Payer: Medicare Other

## 2016-07-27 VITALS — BP 121/77 | HR 83 | Temp 98.5°F | Resp 20

## 2016-07-27 DIAGNOSIS — C9000 Multiple myeloma not having achieved remission: Secondary | ICD-10-CM | POA: Insufficient documentation

## 2016-07-27 DIAGNOSIS — C9001 Multiple myeloma in remission: Secondary | ICD-10-CM

## 2016-07-27 LAB — COMPREHENSIVE METABOLIC PANEL
ALT: 14 U/L (ref 14–54)
ANION GAP: 9 (ref 5–15)
AST: 22 U/L (ref 15–41)
Albumin: 4.3 g/dL (ref 3.5–5.0)
Alkaline Phosphatase: 47 U/L (ref 38–126)
BILIRUBIN TOTAL: 0.4 mg/dL (ref 0.3–1.2)
BUN: 16 mg/dL (ref 6–20)
CALCIUM: 9.5 mg/dL (ref 8.9–10.3)
CO2: 27 mmol/L (ref 22–32)
Chloride: 103 mmol/L (ref 101–111)
Creatinine, Ser: 0.93 mg/dL (ref 0.44–1.00)
GFR calc Af Amer: >60 mL/min (ref >60)
Glucose, Bld: 99 mg/dL (ref 65–99)
POTASSIUM: 4 mmol/L (ref 3.5–5.1)
Sodium: 139 mmol/L (ref 135–145)
TOTAL PROTEIN: 7.8 g/dL (ref 6.5–8.1)

## 2016-07-27 LAB — CBC WITH DIFFERENTIAL/PLATELET
BASOS ABS: 0 10*3/uL (ref 0.0–0.1)
Basophils Relative: 0 %
Eosinophils Absolute: 0.1 10*3/uL (ref 0.0–0.7)
Eosinophils Relative: 1 %
HEMATOCRIT: 36.5 % (ref 36.0–46.0)
HEMOGLOBIN: 11.9 g/dL — AB (ref 12.0–15.0)
LYMPHS PCT: 25 %
Lymphs Abs: 1.1 10*3/uL (ref 0.7–4.0)
MCH: 29 pg (ref 26.0–34.0)
MCHC: 32.6 g/dL (ref 30.0–36.0)
MCV: 89 fL (ref 78.0–100.0)
MONO ABS: 0.4 10*3/uL (ref 0.1–1.0)
MONOS PCT: 8 %
NEUTROS ABS: 2.9 10*3/uL (ref 1.7–7.7)
NEUTROS PCT: 66 %
Platelets: 152 10*3/uL (ref 150–400)
RBC: 4.1 MIL/uL (ref 3.87–5.11)
RDW: 14.1 % (ref 11.5–15.5)
WBC: 4.4 10*3/uL (ref 4.0–10.5)

## 2016-07-27 LAB — LACTATE DEHYDROGENASE: LDH: 167 U/L (ref 98–192)

## 2016-07-27 LAB — MAGNESIUM: MAGNESIUM: 1.8 mg/dL (ref 1.7–2.4)

## 2016-07-27 MED ORDER — DENOSUMAB 120 MG/1.7ML ~~LOC~~ SOLN
120.0000 mg | Freq: Once | SUBCUTANEOUS | Status: AC
  Administered 2016-07-27: 120 mg via SUBCUTANEOUS
  Filled 2016-07-27: qty 1.7

## 2016-07-27 NOTE — Patient Instructions (Signed)
[place] at [facility] Discharge Instructions  RECOMMENDATIONS MADE BY THE CONSULTANT AND ANY TEST RESULTS WILL BE SENT TO YOUR REFERRING PHYSICIAN.  xgeva given and follow up as scheduled  Thank you for choosing [place] at [facility] to provide your oncology and hematology care.  To afford each patient quality time with our provider, please arrive at least 15 minutes before your scheduled appointment time.    If you have a lab appointment with the [facility] please come in thru the  Main Entrance and check in at the main information desk  You need to re-schedule your appointment should you arrive 10 or more minutes late.  We strive to give you quality time with our providers, and arriving late affects you and other patients whose appointments are after yours.  Also, if you no show three or more times for appointments you may be dismissed from the clinic at the providers discretion.     Again, thank you for choosing [facility].  Our hope is that these requests will decrease the amount of time that you wait before being seen by our physicians.       _____________________________________________________________  Should you have questions after your visit to [facility], please contact our office at [phone] between the hours of 8:30 a.m. and 4:30 p.m.  Voicemails left after 4:30 p.m. will not be returned until the following business day.  For prescription refill requests, have your pharmacy contact our office.       Resources For Cancer Patients and their Caregivers ? American Cancer Society: Can assist with transportation, wigs, general needs, runs Look Good Feel Better.        514-385-0808 ? Cancer Care: Provides financial assistance, online support groups, medication/co-pay assistance.  1-800-813-HOPE 8592462288) ? [facility] Assists [place] cancer patients and their  families through emotional , educational and financial support.  720-154-8613 ? [name] Where to apply for food stamps, Medicaid and utility assistance. 289-123-6391 ? [name]: Transportation to medical appointments. 518-681-4999 ? Social Security Administration: May apply for disability if have a Stage IV cancer. 9013799325 (832) 525-7761 ? [name], Disability and Transit Services: Assists with nutrition, care and transit needs. [place] Support Programs: @10RELATIVEDAYS @ > Cancer Support Group  2nd Tuesday of the month 1pm-2pm, Journey Room  > Creative Journey  3rd Tuesday of the month 1130am-1pm, Journey Room  > Look Good Feel Better  1st Wednesday of the month 10am-12 noon, Journey Room (Call [place] to register [phone])

## 2016-07-27 NOTE — Progress Notes (Signed)
Kelli Hurley presents today for injection per MD orders. xgeva 120mg  administered SQ in right Abdomen. Administration without incident. Patient tolerated well. Vitals stable and discharged home from clinic ambulatory. Follow up as scheduled.

## 2016-07-28 LAB — IMMUNOFIXATION ELECTROPHORESIS
IGM, SERUM: 59 mg/dL (ref 26–217)
IgA: 96 mg/dL (ref 87–352)
IgG (Immunoglobin G), Serum: 1321 mg/dL (ref 700–1600)
Total Protein ELP: 7 g/dL (ref 6.0–8.5)

## 2016-07-28 LAB — IGG, IGA, IGM
IGG (IMMUNOGLOBIN G), SERUM: 1334 mg/dL (ref 700–1600)
IgA: 108 mg/dL (ref 87–352)
IgM, Serum: 55 mg/dL (ref 26–217)

## 2016-07-28 LAB — PROTEIN ELECTROPHORESIS, SERUM
A/G RATIO SPE: 1.2 (ref 0.7–1.7)
ALBUMIN ELP: 3.7 g/dL (ref 2.9–4.4)
Alpha-1-Globulin: 0.2 g/dL (ref 0.0–0.4)
Alpha-2-Globulin: 0.6 g/dL (ref 0.4–1.0)
Beta Globulin: 1 g/dL (ref 0.7–1.3)
GAMMA GLOBULIN: 1.3 g/dL (ref 0.4–1.8)
Globulin, Total: 3.2 g/dL (ref 2.2–3.9)
M-Spike, %: 0.4 g/dL — ABNORMAL HIGH
TOTAL PROTEIN ELP: 6.9 g/dL (ref 6.0–8.5)

## 2016-07-28 LAB — KAPPA/LAMBDA LIGHT CHAINS
Kappa free light chain: 18.7 mg/L (ref 3.3–19.4)
Kappa, lambda light chain ratio: 1.65 (ref 0.26–1.65)
LAMDA FREE LIGHT CHAINS: 11.3 mg/L (ref 5.7–26.3)

## 2016-07-28 LAB — BETA 2 MICROGLOBULIN, SERUM: BETA 2 MICROGLOBULIN: 3.4 mg/L — AB (ref 0.6–2.4)

## 2016-07-31 ENCOUNTER — Other Ambulatory Visit (HOSPITAL_COMMUNITY): Payer: Self-pay | Admitting: Emergency Medicine

## 2016-07-31 DIAGNOSIS — C9000 Multiple myeloma not having achieved remission: Secondary | ICD-10-CM

## 2016-07-31 MED ORDER — IXAZOMIB CITRATE 3 MG PO CAPS: ORAL_CAPSULE | ORAL | 0 refills | Status: DC

## 2016-08-07 ENCOUNTER — Other Ambulatory Visit (HOSPITAL_COMMUNITY): Payer: Self-pay | Admitting: Oncology

## 2016-08-07 DIAGNOSIS — E876 Hypokalemia: Secondary | ICD-10-CM

## 2016-08-15 ENCOUNTER — Other Ambulatory Visit (HOSPITAL_COMMUNITY): Payer: Self-pay | Admitting: Oncology

## 2016-08-15 DIAGNOSIS — C9000 Multiple myeloma not having achieved remission: Secondary | ICD-10-CM

## 2016-08-24 ENCOUNTER — Encounter (HOSPITAL_COMMUNITY): Payer: Medicare Other | Attending: Oncology

## 2016-08-24 ENCOUNTER — Encounter (HOSPITAL_COMMUNITY): Payer: Self-pay | Admitting: Oncology

## 2016-08-24 ENCOUNTER — Encounter (HOSPITAL_BASED_OUTPATIENT_CLINIC_OR_DEPARTMENT_OTHER): Payer: Medicare Other

## 2016-08-24 ENCOUNTER — Encounter (HOSPITAL_BASED_OUTPATIENT_CLINIC_OR_DEPARTMENT_OTHER): Payer: Medicare Other | Admitting: Oncology

## 2016-08-24 VITALS — BP 150/82 | HR 98 | Temp 98.5°F | Resp 20 | Wt 338.8 lb

## 2016-08-24 DIAGNOSIS — Z8249 Family history of ischemic heart disease and other diseases of the circulatory system: Secondary | ICD-10-CM | POA: Insufficient documentation

## 2016-08-24 DIAGNOSIS — C9000 Multiple myeloma not having achieved remission: Secondary | ICD-10-CM

## 2016-08-24 DIAGNOSIS — Z9221 Personal history of antineoplastic chemotherapy: Secondary | ICD-10-CM | POA: Insufficient documentation

## 2016-08-24 DIAGNOSIS — C9001 Multiple myeloma in remission: Secondary | ICD-10-CM

## 2016-08-24 DIAGNOSIS — Z79899 Other long term (current) drug therapy: Secondary | ICD-10-CM | POA: Diagnosis not present

## 2016-08-24 DIAGNOSIS — I10 Essential (primary) hypertension: Secondary | ICD-10-CM | POA: Diagnosis not present

## 2016-08-24 DIAGNOSIS — Z9484 Stem cells transplant status: Secondary | ICD-10-CM | POA: Diagnosis not present

## 2016-08-24 LAB — COMPREHENSIVE METABOLIC PANEL
ALK PHOS: 53 U/L (ref 38–126)
ALT: 13 U/L — ABNORMAL LOW (ref 14–54)
ANION GAP: 9 (ref 5–15)
AST: 23 U/L (ref 15–41)
Albumin: 4.2 g/dL (ref 3.5–5.0)
BILIRUBIN TOTAL: 0.5 mg/dL (ref 0.3–1.2)
BUN: 18 mg/dL (ref 6–20)
CALCIUM: 9.8 mg/dL (ref 8.9–10.3)
CO2: 29 mmol/L (ref 22–32)
Chloride: 104 mmol/L (ref 101–111)
Creatinine, Ser: 0.95 mg/dL (ref 0.44–1.00)
Glucose, Bld: 101 mg/dL — ABNORMAL HIGH (ref 65–99)
Potassium: 4.1 mmol/L (ref 3.5–5.1)
SODIUM: 142 mmol/L (ref 135–145)
TOTAL PROTEIN: 8 g/dL (ref 6.5–8.1)

## 2016-08-24 LAB — CBC WITH DIFFERENTIAL/PLATELET
BASOS ABS: 0 10*3/uL (ref 0.0–0.1)
BASOS PCT: 0 %
EOS PCT: 1 %
Eosinophils Absolute: 0.1 10*3/uL (ref 0.0–0.7)
HCT: 35.6 % — ABNORMAL LOW (ref 36.0–46.0)
Hemoglobin: 11.8 g/dL — ABNORMAL LOW (ref 12.0–15.0)
Lymphocytes Relative: 28 %
Lymphs Abs: 1.6 10*3/uL (ref 0.7–4.0)
MCH: 29.6 pg (ref 26.0–34.0)
MCHC: 33.1 g/dL (ref 30.0–36.0)
MCV: 89.2 fL (ref 78.0–100.0)
MONO ABS: 0.5 10*3/uL (ref 0.1–1.0)
Monocytes Relative: 8 %
NEUTROS ABS: 3.5 10*3/uL (ref 1.7–7.7)
Neutrophils Relative %: 63 %
PLATELETS: 162 10*3/uL (ref 150–400)
RBC: 3.99 MIL/uL (ref 3.87–5.11)
RDW: 14.3 % (ref 11.5–15.5)
WBC: 5.6 10*3/uL (ref 4.0–10.5)

## 2016-08-24 LAB — MAGNESIUM: MAGNESIUM: 1.8 mg/dL (ref 1.7–2.4)

## 2016-08-24 LAB — LACTATE DEHYDROGENASE: LDH: 162 U/L (ref 98–192)

## 2016-08-24 MED ORDER — IXAZOMIB CITRATE 3 MG PO CAPS: ORAL_CAPSULE | ORAL | 4 refills | Status: DC

## 2016-08-24 MED ORDER — HYDROCHLOROTHIAZIDE 25 MG PO TABS: 25.0000 mg | ORAL_TABLET | Freq: Every day | ORAL | 4 refills | Status: DC

## 2016-08-24 MED ORDER — DENOSUMAB 120 MG/1.7ML ~~LOC~~ SOLN
120.0000 mg | Freq: Once | SUBCUTANEOUS | Status: AC
  Administered 2016-08-24: 120 mg via SUBCUTANEOUS
  Filled 2016-08-24: qty 1.7

## 2016-08-24 NOTE — Progress Notes (Signed)
[place], [place] [address]   CLINIC:  Medical Oncology/Hematology Progress Note  PCP:  Mackey Birchwood [address] [place] [address] [phone]   REASON FOR VISIT:  Follow-up for h/o Multiple Myeloma s/p stem cell transplant on 05/07/15  CURRENT THERAPY: Maintenance Velcade every 2 weeks & Zometa monthly   BRIEF ONCOLOGIC HISTORY:    Multiple myeloma ([place])   08/07/2014 Imaging    Bone Survey- No lytic lesions are noted in the visualized skeleton.      09/03/2014 Bone Marrow Biopsy    NORMOCELLULAR BONE MARROW FOR AGE WITH PLASMA CELL NEOPLASM.  The plasma cell component is increased in the marrow representing an estimated 18% of all cells. Cytogenetics with 13q-, 17p- (high risk disease)      09/03/2014 Pathology Results    Cytogenetics with 13q-, 17p- (high risk disease)      09/23/2014 Initial Diagnosis    Multiple myeloma      09/30/2014 PET scan    No abnormal hypermetabolism in the neck, chest, abdomen or pelvis.      10/05/2014 - 03/22/2015 Chemotherapy    RVD      10/28/2014 Treatment Plan Change    Issues related to getting Revlimid in a timely fashion, therefore, she received her Revlimid on 9/21 resulting in a 12 day cycle this time instead of a 14 day cycle      11/02/2014 Imaging    CTA chest- No evidence for a large or central pulmonary embolism as described.  8 mm density along the right minor fissure could represent focal pleural thickening but indeterminate. If the patient is at high risk for bronchogenic carcinoma, follow-up c      11/23/2014 Miscellaneous    Zometa 4 mg IV monthly      04/07/2015 Bone Marrow Biopsy    Normocellular marrow with 2-4% clonal plasma cells by immunohistochemistry. FISH and cytogenetics were normal [facility])        04/2015 Miscellaneous    PRETRANSPLANT EVALUATION:  Pulmonary function tests: FEV1 100.3% / DLCO 97.9%  Echocardiogram: Normal LV function with EF  60-65%       04/27/2015 Procedure    Stem cell mobilization with filgrastim and Mozobil [facility])      05/06/2015 Miscellaneous    BMT conditioning regimen with high-dose Melphalan given [facility] [name]); Day -1      05/07/2015 Bone Marrow Transplant    Outpatient autologous stem cell transplant [facility] [name]); Day 0      05/18/2015 Miscellaneous    WBC engraftment;  did not require platelet transfusion during her transplant process. Lowest platelet count 28,000       05/20/2015 Procedure    Tunneled catheter removed [facility])      09/08/2015 -  Chemotherapy    Velcade every 2 weeks      12/15/2015 Miscellaneous    Zometa re-instituted.       12/23/2015 Imaging    Bone density- BMD as determined from Forearm Radius 33% is 0.799 g/cm2 with a T-Score of 1.2. This patient is considered normal according to [facility]) criteria.      04/17/2016 Miscellaneous    Started maintenance with Ninlaro.      05/04/2016 Treatment Plan Change    Zometa switched to Xgeva injection monthly given difficult IV access.         INTERVAL HISTORY:  Ms.Mabus presents to the cancer center unaccompanied today for routine follow-up.  Patient states that she has been doing well. She denies any side effects from the [facility]. Her only complaint is regarding her weight today. She once to lose weight however states that she has difficulty exercising due to left knee pain. She denies any chest pain, shortness breath, abdominal pain, focal weakness.  REVIEW OF SYSTEMS:  Review of Systems  Constitutional: Negative.  Negative for chills, fatigue and fever.  HENT:  Negative.   Eyes: Negative.   Respiratory: Negative for cough and shortness of breath.   Cardiovascular: Negative.  Negative for chest pain.  Gastrointestinal: Negative.  Negative for abdominal pain, constipation, diarrhea, nausea and vomiting.  Endocrine: Negative.   Genitourinary: Negative.      Musculoskeletal: Negative.   Skin: Negative.   Neurological: Negative.   Hematological: Negative.   Psychiatric/Behavioral: Negative.   All other systems reviewed and are negative.    PAST MEDICAL/SURGICAL HISTORY:  Past Medical History:  Diagnosis Date  . Anemia   . Claustrophobia 10/05/2014  . Hypertension   . Leukopenia 08/03/2014  . Normocytic hypochromic anemia 08/03/2014   Past Surgical History:  Procedure Laterality Date  . ABDOMINAL HYSTERECTOMY    . OTHER SURGICAL HISTORY     heart surgery as infant to "repair hole in heart"     SOCIAL HISTORY:  Social History   Social History  . Marital status: Legally Separated    Spouse name: N/A  . Number of children: N/A  . Years of education: N/A   Occupational History  . Not on file.   Social History Main Topics  . Smoking status: Never Smoker  . Smokeless tobacco: Never Used  . Alcohol use No  . Drug use: No  . Sexual activity: Not on file     Comment: divorced- 2 daughters   Other Topics Concern  . Not on file   Social History Narrative  . No narrative on file    FAMILY HISTORY:  Family History  Problem Relation Age of Onset  . Cancer Mother   . Hypertension Mother   . Cancer Father   . Hypertension Father   . Cancer Maternal Grandmother     CURRENT MEDICATIONS:  Outpatient Encounter Prescriptions as of 04/06/2016  Medication Sig  . acyclovir (ZOVIRAX) 800 MG tablet TAKE 1 TABLET BY MOUTH TWICE DAILY  . Bortezomib (VELCADE IJ) Inject as directed every 14 (fourteen) days.  . hydrochlorothiazide (HYDRODIURIL) 25 MG tablet Take 1 tablet (25 mg total) by mouth every morning.  . Multiple Vitamin (TAB-A-VITE) TABS TAKE 1 TABLET BY MOUTH ONCE DAILY.  Marland Kitchen potassium chloride SA (K-DUR,KLOR-CON) 20 MEQ tablet Take 1 tablet (20 mEq total) by mouth 2 (two) times daily.  Marland Kitchen PROAIR HFA 108 (90 Base) MCG/ACT inhaler Inhale 2 puffs into the lungs daily as needed.   . zolendronic acid (ZOMETA) 4 MG/5ML injection  Inject 4 mg into the vein every 28 (twenty-eight) days.   Facility-Administered Encounter Medications as of 04/06/2016  Medication  . heparin lock flush 100 unit/mL  . sodium chloride 0.9 % injection 10 mL    ALLERGIES:  No Known Allergies   PHYSICAL EXAM:  ECOG Performance status: 0  Vitals:   08/24/16 0950  BP: (!) 150/82  Pulse: 98  Resp: 20  Temp: 98.5 F (36.9 C)   Filed Weights   08/24/16 0950  Weight: (!) 338 lb 12.8 oz (153.7 kg)     Physical Exam  Constitutional: She is oriented to person, place, and time and well-developed, well-nourished, and in  no distress.  HENT:  Head: Normocephalic and atraumatic.  Mouth/Throat: Oropharynx is clear and moist. No oropharyngeal exudate.  Eyes: Pupils are equal, round, and reactive to light. Conjunctivae and EOM are normal. No scleral icterus.  Neck: Normal range of motion. Neck supple.  Cardiovascular: Normal rate, regular rhythm and normal heart sounds.   Pulmonary/Chest: Effort normal and breath sounds normal. No respiratory distress.  Abdominal: Soft. Bowel sounds are normal. There is no tenderness.  Musculoskeletal: Normal range of motion. She exhibits no edema.  Lymphadenopathy:    She has no cervical adenopathy.  Neurological: She is alert and oriented to person, place, and time. No cranial nerve deficit. Gait normal.  Skin: Skin is warm and dry.  Psychiatric: Mood, memory, affect and judgment normal.  Nursing note and vitals reviewed.    LABORATORY DATA:  I have reviewed the labs as listed.  CBC    Component Value Date/Time   WBC 5.6 08/24/2016 0849   RBC 3.99 08/24/2016 0849   HGB 11.8 (L) 08/24/2016 0849   HCT 35.6 (L) 08/24/2016 0849   PLT 162 08/24/2016 0849   MCV 89.2 08/24/2016 0849   MCH 29.6 08/24/2016 0849   MCHC 33.1 08/24/2016 0849   RDW 14.3 08/24/2016 0849   LYMPHSABS 1.6 08/24/2016 0849   MONOABS 0.5 08/24/2016 0849   EOSABS 0.1 08/24/2016 0849   BASOSABS 0.0 08/24/2016 0849   CMP  Latest Ref Rng & Units 08/24/2016 07/27/2016 06/29/2016  Glucose 65 - 99 mg/dL 101(H) 99 106(H)  BUN 6 - 20 mg/dL _0 Creatinine 0.44 - 1.00 mg/dL 0.95 0.93 0.93  Sodium 135 - 145 mmol/L 142 139 140  Potassium 3.5 - 5.1 mmol/L 4.1 4.0 4.0  Chloride 101 - 111 mmol/L 104 103 104  CO2 22 - 32 mmol/L _1 Calcium 8.9 - 10.3 mg/dL 9.8 9.5 9.5  Total Protein 6.5 - 8.1 g/dL 8.0 7.8 8.0  Total Bilirubin 0.3 - 1.2 mg/dL 0.5 0.4 0.5  Alkaline Phos 38 - 126 U/L 53 47 52  AST 15 - 41 U/L _2 ALT 14 - 54 U/L 13(L) 14 13(L)   Results for CHANEY, INGRAM (MRN [id number])   Ref. Range 03/30/2016 12:11  Total Protein ELP Latest Ref Range: 6.0 - 8.5 g/dL 7.5  Albumin ELP Latest Ref Range: 2.9 - 4.4 g/dL 4.0  Globulin, Total Latest Ref Range: 2.2 - 3.9 g/dL 3.5  A/G Ratio Latest Ref Range: 0.7 - 1.7  1.1  Alpha-1-Globulin Latest Ref Range: 0.0 - 0.4 g/dL 0.2  Alpha-2-Globulin Latest Ref Range: 0.4 - 1.0 g/dL 0.7  Beta Globulin Latest Ref Range: 0.7 - 1.3 g/dL 1.1  Gamma Globulin Latest Ref Range: 0.4 - 1.8 g/dL 1.4  M-SPIKE, % Latest Ref Range: Not Observed g/dL 0.4 (H)   Results for KENSLEY, VALLADARES (MRN [id number])  Ref. Range 03/30/2016 12:10  IgG (Immunoglobin G), Serum Latest Ref Range: 700 - 1,600 mg/dL 1,413  IgA Latest Ref Range: 87 - 352 mg/dL 136  IgM, Serum Latest Ref Range: 26 - 217 mg/dL 56  Kappa free light chain Latest Ref Range: 3.3 - 19.4 mg/L 24.8 (H)  Lamda free light chains Latest Ref Range: 5.7 - 26.3 mg/L 15.4  Kappa, lamda light chain ratio Latest Ref Range: 0.26 - 1.65  1.61         PENDING LABS:    DIAGNOSTIC IMAGING:  I have personally reviewed the radiological images as listed and agreed with  the findings in the report.   DEXA scan 12/23/2015   PATHOLOGY:     ASSESSMENT & PLAN:  [name] is a very pleasant 61 y.o. female with history of Multiple Myeloma with high-risk features (13q-, 17p-); treated with RVD therapy, then stem  cell transplant at [facility] on 05/07/15.    Multiple myeloma s/p stem cell transplant:  -Continue maintenance ninlaro. New Rx printed for refill. -Myeloma labs pending at this time. Will follow up. -Continue follow up with BMT, Dr. Norma Fredrickson, at scheduled appt in 11/2016.  Bone health:  -Continue xgeva, she will get a dose today. Xgeva q28 days.  Dispo: RTC in 3 months for continued follow up with labs.  Orders Placed This Encounter  Procedures  . CBC with Differential    Standing Status:   Future    Standing Expiration Date:   08/24/2017  . Comprehensive metabolic panel    Standing Status:   Future    Standing Expiration Date:   08/24/2017  . Protein electrophoresis, serum    Standing Status:   Future    Standing Expiration Date:   08/24/2017  . Beta 2 microglobuline, serum    Standing Status:   Future    Standing Expiration Date:   08/24/2017  . Kappa/lambda light chains    Standing Status:   Future    Standing Expiration Date:   08/24/2017  . IgG, IgA, IgM    Standing Status:   Future    Standing Expiration Date:   08/24/2017      All questions were answered to patient's stated satisfaction. Encouraged patient to call with any new concerns or questions before her next visit to the cancer center and we can certain see her sooner, if needed.    Twana First, MD

## 2016-08-24 NOTE — Progress Notes (Signed)
Corrin Parker presents today for injection per the provider's orders.  Xgeva administration without incident; see MAR for injection details.  Patient tolerated procedure well and without incident.  No questions or complaints noted at this time.  Discharged ambulatory.

## 2016-08-24 NOTE — Patient Instructions (Signed)
[facility] at [facility] Discharge Instructions  RECOMMENDATIONS MADE BY THE CONSULTANT AND ANY TEST RESULTS WILL BE SENT TO YOUR REFERRING PHYSICIAN.  You saw Dr. Zhou today.  Thank you for choosing [facility] at [facility] to provide your oncology and hematology care.  To afford each patient quality time with our provider, please arrive at least 15 minutes before your scheduled appointment time.    If you have a lab appointment with the Cancer Center please come in thru the  Main Entrance and check in at the main information desk  You need to re-schedule your appointment should you arrive 10 or more minutes late.  We strive to give you quality time with our providers, and arriving late affects you and other patients whose appointments are after yours.  Also, if you no show three or more times for appointments you may be dismissed from the clinic at the providers discretion.     Again, thank you for choosing [facility].  Our hope is that these requests will decrease the amount of time that you wait before being seen by our physicians.       _____________________________________________________________  Should you have questions after your visit to [facility], please contact our office at [phone] between the hours of 8:30 a.m. and 4:30 p.m.  Voicemails left after 4:30 p.m. will not be returned until the following business day.  For prescription refill requests, have your pharmacy contact our office.       Resources For Cancer Patients and their Caregivers ? American Cancer Society: Can assist with transportation, wigs, general needs, runs Look Good Feel Better.        1-888-227-6333 ? Cancer Care: Provides financial assistance, online support groups, medication/co-pay assistance.  1-800-813-HOPE (4673) ? [facility] Assists [place] cancer patients and their families through  emotional , educational and financial support.  336-427-4357 ? [name] Where to apply for food stamps, Medicaid and utility assistance. 336-342-1394 ? [name]: Transportation to medical appointments. 336-347-2287 ? Social Security Administration: May apply for disability if have a Stage IV cancer. 336-342-7796 1-800-772-1213 ? [place] Aging, Disability and Transit Services: Assists with nutrition, care and transit needs. 336-349-2343  Cancer Center Support Programs: @[email or web address]@ > Cancer Support Group  2nd Tuesday of the month 1pm-2pm, Journey Room  > Creative Journey  3rd Tuesday of the month 1130am-1pm, Journey Room  > Look Good Feel Better  1st Wednesday of the month 10am-12 noon, Journey Room (Call American Cancer Society to register 1-800-395-5775)    

## 2016-08-25 LAB — KAPPA/LAMBDA LIGHT CHAINS
KAPPA, LAMDA LIGHT CHAIN RATIO: 1.52 (ref 0.26–1.65)
Kappa free light chain: 18.7 mg/L (ref 3.3–19.4)
LAMDA FREE LIGHT CHAINS: 12.3 mg/L (ref 5.7–26.3)

## 2016-08-25 LAB — IGG, IGA, IGM
IGM, SERUM: 57 mg/dL (ref 26–217)
IgA: 110 mg/dL (ref 87–352)
IgG (Immunoglobin G), Serum: 1429 mg/dL (ref 700–1600)

## 2016-08-25 LAB — BETA 2 MICROGLOBULIN, SERUM: BETA 2 MICROGLOBULIN: 2.7 mg/L — AB (ref 0.6–2.4)

## 2016-08-26 LAB — IMMUNOFIXATION ELECTROPHORESIS
IGA: 106 mg/dL (ref 87–352)
IgG (Immunoglobin G), Serum: 1317 mg/dL (ref 700–1600)
IgM, Serum: 59 mg/dL (ref 26–217)
Total Protein ELP: 7.6 g/dL (ref 6.0–8.5)

## 2016-08-29 LAB — PROTEIN ELECTROPHORESIS, SERUM
A/G RATIO SPE: 1.1 (ref 0.7–1.7)
ALPHA-1-GLOBULIN: 0.2 g/dL (ref 0.0–0.4)
ALPHA-2-GLOBULIN: 0.7 g/dL (ref 0.4–1.0)
Albumin ELP: 3.8 g/dL (ref 2.9–4.4)
Beta Globulin: 1.1 g/dL (ref 0.7–1.3)
GLOBULIN, TOTAL: 3.5 g/dL (ref 2.2–3.9)
Gamma Globulin: 1.5 g/dL (ref 0.4–1.8)
M-Spike, %: 0.4 g/dL — ABNORMAL HIGH
Total Protein ELP: 7.3 g/dL (ref 6.0–8.5)

## 2016-09-05 DIAGNOSIS — M170 Bilateral primary osteoarthritis of knee: Secondary | ICD-10-CM | POA: Diagnosis not present

## 2016-09-05 DIAGNOSIS — I10 Essential (primary) hypertension: Secondary | ICD-10-CM | POA: Diagnosis not present

## 2016-09-10 ENCOUNTER — Other Ambulatory Visit (HOSPITAL_COMMUNITY): Payer: Self-pay | Admitting: Oncology

## 2016-09-10 DIAGNOSIS — E876 Hypokalemia: Secondary | ICD-10-CM

## 2016-09-13 ENCOUNTER — Telehealth (HOSPITAL_COMMUNITY): Payer: Self-pay | Admitting: *Deleted

## 2016-09-13 ENCOUNTER — Other Ambulatory Visit (HOSPITAL_COMMUNITY): Payer: Self-pay | Admitting: Oncology

## 2016-09-13 DIAGNOSIS — E876 Hypokalemia: Secondary | ICD-10-CM

## 2016-09-13 MED ORDER — POTASSIUM CHLORIDE CRYS ER 20 MEQ PO TBCR: 20.0000 meq | EXTENDED_RELEASE_TABLET | Freq: Two times a day (BID) | ORAL | 2 refills | Status: DC

## 2016-09-22 ENCOUNTER — Encounter (HOSPITAL_BASED_OUTPATIENT_CLINIC_OR_DEPARTMENT_OTHER): Payer: Medicare Other

## 2016-09-22 ENCOUNTER — Encounter (HOSPITAL_COMMUNITY): Payer: Medicare Other | Attending: Adult Health

## 2016-09-22 ENCOUNTER — Encounter (HOSPITAL_COMMUNITY): Payer: Self-pay

## 2016-09-22 VITALS — BP 146/71 | HR 86 | Temp 98.7°F | Resp 18

## 2016-09-22 DIAGNOSIS — C9001 Multiple myeloma in remission: Secondary | ICD-10-CM

## 2016-09-22 DIAGNOSIS — C9000 Multiple myeloma not having achieved remission: Secondary | ICD-10-CM

## 2016-09-22 LAB — COMPREHENSIVE METABOLIC PANEL
ALBUMIN: 4.3 g/dL (ref 3.5–5.0)
ALT: 14 U/L (ref 14–54)
AST: 19 U/L (ref 15–41)
Alkaline Phosphatase: 51 U/L (ref 38–126)
Anion gap: 7 (ref 5–15)
BILIRUBIN TOTAL: 0.5 mg/dL (ref 0.3–1.2)
BUN: 17 mg/dL (ref 6–20)
CHLORIDE: 103 mmol/L (ref 101–111)
CO2: 27 mmol/L (ref 22–32)
CREATININE: 0.94 mg/dL (ref 0.44–1.00)
Calcium: 9.3 mg/dL (ref 8.9–10.3)
GFR calc Af Amer: >60 mL/min (ref >60)
GFR calc non Af Amer: >60 mL/min (ref >60)
GLUCOSE: 101 mg/dL — AB (ref 65–99)
POTASSIUM: 4.4 mmol/L (ref 3.5–5.1)
Sodium: 137 mmol/L (ref 135–145)
Total Protein: 8.2 g/dL — ABNORMAL HIGH (ref 6.5–8.1)

## 2016-09-22 LAB — CBC WITH DIFFERENTIAL/PLATELET
BASOS ABS: 0 10*3/uL (ref 0.0–0.1)
Basophils Relative: 0 %
Eosinophils Absolute: 0.1 10*3/uL (ref 0.0–0.7)
Eosinophils Relative: 1 %
HEMATOCRIT: 37.6 % (ref 36.0–46.0)
Hemoglobin: 12.5 g/dL (ref 12.0–15.0)
LYMPHS PCT: 30 %
Lymphs Abs: 1.5 10*3/uL (ref 0.7–4.0)
MCH: 29.5 pg (ref 26.0–34.0)
MCHC: 33.2 g/dL (ref 30.0–36.0)
MCV: 88.7 fL (ref 78.0–100.0)
Monocytes Absolute: 0.5 10*3/uL (ref 0.1–1.0)
Monocytes Relative: 10 %
NEUTROS ABS: 2.9 10*3/uL (ref 1.7–7.7)
NEUTROS PCT: 59 %
Platelets: 165 10*3/uL (ref 150–400)
RBC: 4.24 MIL/uL (ref 3.87–5.11)
RDW: 14 % (ref 11.5–15.5)
WBC: 4.9 10*3/uL (ref 4.0–10.5)

## 2016-09-22 LAB — MAGNESIUM: Magnesium: 2.1 mg/dL (ref 1.7–2.4)

## 2016-09-22 MED ORDER — DENOSUMAB 120 MG/1.7ML ~~LOC~~ SOLN
120.0000 mg | Freq: Once | SUBCUTANEOUS | Status: AC
Start: 2016-09-22 — End: 2016-09-22
  Administered 2016-09-22: 120 mg via SUBCUTANEOUS
  Filled 2016-09-22: qty 1.7

## 2016-09-22 NOTE — Progress Notes (Signed)
Kelli Hurley tolerated Xgeva injection well without complaints or incident. Calcium 9.3 today and pt denies any tooth,jaw or leg pain. Pt also denies any dental issues or recent/future dental visits. VSS Pt discharged self ambulatory in satisfactory condition

## 2016-09-22 NOTE — Patient Instructions (Signed)
[facility] at [facility] Discharge Instructions  RECOMMENDATIONS MADE BY THE CONSULTANT AND ANY TEST RESULTS WILL BE SENT TO YOUR REFERRING PHYSICIAN.  Received Xgeva injection today. Follow-up as scheduled. Call clinic for any questions or concerns  Thank you for choosing [facility] at [facility] to provide your oncology and hematology care.  To afford each patient quality time with our provider, please arrive at least 15 minutes before your scheduled appointment time.    If you have a lab appointment with the Cancer Center please come in thru the  Main Entrance and check in at the main information desk  You need to re-schedule your appointment should you arrive 10 or more minutes late.  We strive to give you quality time with our providers, and arriving late affects you and other patients whose appointments are after yours.  Also, if you no show three or more times for appointments you may be dismissed from the clinic at the providers discretion.     Again, thank you for choosing [facility].  Our hope is that these requests will decrease the amount of time that you wait before being seen by our physicians.       _____________________________________________________________  Should you have questions after your visit to [facility], please contact our office at [phone] between the hours of 8:30 a.m. and 4:30 p.m.  Voicemails left after 4:30 p.m. will not be returned until the following business day.  For prescription refill requests, have your pharmacy contact our office.       Resources For Cancer Patients and their Caregivers ? American Cancer Society: Can assist with transportation, wigs, general needs, runs Look Good Feel Better.        1-888-227-6333 ? Cancer Care: Provides financial assistance, online support groups, medication/co-pay assistance.  1-800-813-HOPE (4673) ? [facility] Assists [place] cancer patients and their families through emotional , educational and financial support.  336-427-4357 ? [name] Where to apply for food stamps, Medicaid and utility assistance. 336-342-1394 ? [name]: Transportation to medical appointments. 336-347-2287 ? Social Security Administration: May apply for disability if have a Stage IV cancer. 336-342-7796 1-800-772-1213 ? [place] Aging, Disability and Transit Services: Assists with nutrition, care and transit needs. 336-349-2343  Cancer Center Support Programs: @[email or web address]@ > Cancer Support Group  2nd Tuesday of the month 1pm-2pm, Journey Room  > Creative Journey  3rd Tuesday of the month 1130am-1pm, Journey Room  > Look Good Feel Better  1st Wednesday of the month 10am-12 noon, Journey Room (Call American Cancer Society to register 1-800-395-5775)   

## 2016-09-25 ENCOUNTER — Other Ambulatory Visit (HOSPITAL_COMMUNITY): Payer: Self-pay | Admitting: *Deleted

## 2016-09-25 DIAGNOSIS — C9000 Multiple myeloma not having achieved remission: Secondary | ICD-10-CM

## 2016-09-25 MED ORDER — IXAZOMIB CITRATE 3 MG PO CAPS: ORAL_CAPSULE | ORAL | 4 refills | Status: DC

## 2016-10-19 ENCOUNTER — Other Ambulatory Visit (HOSPITAL_COMMUNITY): Payer: Self-pay | Admitting: Emergency Medicine

## 2016-10-19 DIAGNOSIS — C9000 Multiple myeloma not having achieved remission: Secondary | ICD-10-CM

## 2016-10-19 MED ORDER — IXAZOMIB CITRATE 3 MG PO CAPS
ORAL_CAPSULE | ORAL | 4 refills | Status: DC
Start: 2016-10-19 — End: 2016-11-21

## 2016-10-20 ENCOUNTER — Other Ambulatory Visit (HOSPITAL_COMMUNITY): Payer: Medicare Other

## 2016-10-20 ENCOUNTER — Ambulatory Visit (HOSPITAL_COMMUNITY): Payer: Medicare Other

## 2016-10-24 ENCOUNTER — Encounter (HOSPITAL_COMMUNITY): Payer: Medicare Other

## 2016-10-24 ENCOUNTER — Encounter (HOSPITAL_COMMUNITY): Payer: Self-pay

## 2016-10-24 ENCOUNTER — Encounter (HOSPITAL_COMMUNITY): Payer: Medicare Other | Attending: Adult Health

## 2016-10-24 VITALS — BP 158/81 | HR 79 | Temp 98.2°F | Resp 18 | Wt 340.4 lb

## 2016-10-24 DIAGNOSIS — C9001 Multiple myeloma in remission: Secondary | ICD-10-CM | POA: Diagnosis not present

## 2016-10-24 DIAGNOSIS — C9000 Multiple myeloma not having achieved remission: Secondary | ICD-10-CM | POA: Insufficient documentation

## 2016-10-24 LAB — CBC WITH DIFFERENTIAL/PLATELET
BASOS ABS: 0 10*3/uL (ref 0.0–0.1)
BASOS PCT: 0 %
EOS ABS: 0 10*3/uL (ref 0.0–0.7)
EOS PCT: 1 %
HCT: 36.8 % (ref 36.0–46.0)
Hemoglobin: 12.1 g/dL (ref 12.0–15.0)
Lymphocytes Relative: 34 %
Lymphs Abs: 1.5 10*3/uL (ref 0.7–4.0)
MCH: 29.1 pg (ref 26.0–34.0)
MCHC: 32.9 g/dL (ref 30.0–36.0)
MCV: 88.5 fL (ref 78.0–100.0)
MONO ABS: 0.4 10*3/uL (ref 0.1–1.0)
Monocytes Relative: 8 %
NEUTROS ABS: 2.6 10*3/uL (ref 1.7–7.7)
Neutrophils Relative %: 57 %
PLATELETS: 170 10*3/uL (ref 150–400)
RBC: 4.16 MIL/uL (ref 3.87–5.11)
RDW: 13.9 % (ref 11.5–15.5)
WBC: 4.5 10*3/uL (ref 4.0–10.5)

## 2016-10-24 LAB — COMPREHENSIVE METABOLIC PANEL
ALBUMIN: 4.1 g/dL (ref 3.5–5.0)
ALT: 12 U/L — ABNORMAL LOW (ref 14–54)
ANION GAP: 8 (ref 5–15)
AST: 20 U/L (ref 15–41)
Alkaline Phosphatase: 50 U/L (ref 38–126)
BUN: 18 mg/dL (ref 6–20)
CHLORIDE: 104 mmol/L (ref 101–111)
CO2: 28 mmol/L (ref 22–32)
Calcium: 9.6 mg/dL (ref 8.9–10.3)
Creatinine, Ser: 0.89 mg/dL (ref 0.44–1.00)
GFR calc Af Amer: >60 mL/min (ref >60)
GFR calc non Af Amer: >60 mL/min (ref >60)
GLUCOSE: 98 mg/dL (ref 65–99)
POTASSIUM: 4 mmol/L (ref 3.5–5.1)
SODIUM: 140 mmol/L (ref 135–145)
Total Bilirubin: 0.4 mg/dL (ref 0.3–1.2)
Total Protein: 8.2 g/dL — ABNORMAL HIGH (ref 6.5–8.1)

## 2016-10-24 MED ORDER — DENOSUMAB 120 MG/1.7ML ~~LOC~~ SOLN
120.0000 mg | Freq: Once | SUBCUTANEOUS | Status: AC
  Administered 2016-10-24: 120 mg via SUBCUTANEOUS
  Filled 2016-10-24: qty 1.7

## 2016-10-24 NOTE — Patient Instructions (Signed)
[facility] at [facility] Discharge Instructions  RECOMMENDATIONS MADE BY THE CONSULTANT AND ANY TEST RESULTS WILL BE SENT TO YOUR REFERRING PHYSICIAN.  Received Xgeva injection today. Follow-up as scheduled. Call clinic for any questions or concerns  Thank you for choosing [facility] at [facility] to provide your oncology and hematology care.  To afford each patient quality time with our provider, please arrive at least 15 minutes before your scheduled appointment time.    If you have a lab appointment with the Cancer Center please come in thru the  Main Entrance and check in at the main information desk  You need to re-schedule your appointment should you arrive 10 or more minutes late.  We strive to give you quality time with our providers, and arriving late affects you and other patients whose appointments are after yours.  Also, if you no show three or more times for appointments you may be dismissed from the clinic at the providers discretion.     Again, thank you for choosing [facility].  Our hope is that these requests will decrease the amount of time that you wait before being seen by our physicians.       _____________________________________________________________  Should you have questions after your visit to  Cancer Center, please contact our office at [phone] between the hours of 8:30 a.m. and 4:30 p.m.  Voicemails left after 4:30 p.m. will not be returned until the following business day.  For prescription refill requests, have your pharmacy contact our office.       Resources For Cancer Patients and their Caregivers ? American Cancer Society: Can assist with transportation, wigs, general needs, runs Look Good Feel Better.        1-888-227-6333 ? Cancer Care: Provides financial assistance, online support groups, medication/co-pay assistance.  1-800-813-HOPE (4673) ? [facility] Assists [place] cancer patients and their families through emotional , educational and financial support.  336-427-4357 ? [name] Where to apply for food stamps, Medicaid and utility assistance. 336-342-1394 ? [name]: Transportation to medical appointments. 336-347-2287 ? Social Security Administration: May apply for disability if have a Stage IV cancer. 336-342-7796 1-800-772-1213 ? [place] Aging, Disability and Transit Services: Assists with nutrition, care and transit needs. 336-349-2343  Cancer Center Support Programs: @[email or web address]@ > Cancer Support Group  2nd Tuesday of the month 1pm-2pm, Journey Room  > Creative Journey  3rd Tuesday of the month 1130am-1pm, Journey Room  > Look Good Feel Better  1st Wednesday of the month 10am-12 noon, Journey Room (Call American Cancer Society to register 1-800-395-5775)   

## 2016-10-24 NOTE — Progress Notes (Signed)
Kelli Hurley tolerated Xgeva injection well without complaints or incident. Labs reviewed prior to administering this medication. Pt denied any jaw,tooth or leg pain as well as recent or future dental visits. Pt reported taking her Calcium as well as Ninlaro medications as prescribed. Pt did report a migraine headache over the weekend and pt encouraged to call if these headaches increase in frequency. Pt verbalized understanding. VSS Pt discharged self ambulatory in satisfactory condition

## 2016-11-17 ENCOUNTER — Other Ambulatory Visit (HOSPITAL_COMMUNITY): Payer: Medicare Other

## 2016-11-17 ENCOUNTER — Ambulatory Visit (HOSPITAL_COMMUNITY): Payer: Medicare Other | Admitting: Adult Health

## 2016-11-17 ENCOUNTER — Ambulatory Visit (HOSPITAL_COMMUNITY): Payer: Medicare Other

## 2016-11-21 ENCOUNTER — Encounter (HOSPITAL_BASED_OUTPATIENT_CLINIC_OR_DEPARTMENT_OTHER): Payer: Medicare Other

## 2016-11-21 ENCOUNTER — Encounter (HOSPITAL_COMMUNITY): Payer: Self-pay

## 2016-11-21 ENCOUNTER — Encounter (HOSPITAL_BASED_OUTPATIENT_CLINIC_OR_DEPARTMENT_OTHER): Payer: Medicare Other | Admitting: Oncology

## 2016-11-21 ENCOUNTER — Encounter (HOSPITAL_COMMUNITY): Payer: Medicare Other | Attending: Oncology

## 2016-11-21 ENCOUNTER — Other Ambulatory Visit (HOSPITAL_COMMUNITY): Payer: Self-pay | Admitting: Oncology

## 2016-11-21 VITALS — BP 138/91 | HR 82 | Resp 18 | Ht 62.0 in | Wt 333.0 lb

## 2016-11-21 DIAGNOSIS — Z79899 Other long term (current) drug therapy: Secondary | ICD-10-CM | POA: Diagnosis not present

## 2016-11-21 DIAGNOSIS — Z9221 Personal history of antineoplastic chemotherapy: Secondary | ICD-10-CM | POA: Insufficient documentation

## 2016-11-21 DIAGNOSIS — Z9049 Acquired absence of other specified parts of digestive tract: Secondary | ICD-10-CM | POA: Diagnosis not present

## 2016-11-21 DIAGNOSIS — Z9484 Stem cells transplant status: Secondary | ICD-10-CM | POA: Diagnosis not present

## 2016-11-21 DIAGNOSIS — C9000 Multiple myeloma not having achieved remission: Secondary | ICD-10-CM

## 2016-11-21 DIAGNOSIS — C9001 Multiple myeloma in remission: Secondary | ICD-10-CM

## 2016-11-21 LAB — CBC WITH DIFFERENTIAL/PLATELET
Basophils Absolute: 0 10*3/uL (ref 0.0–0.1)
Basophils Relative: 0 %
Eosinophils Absolute: 0 10*3/uL (ref 0.0–0.7)
Eosinophils Relative: 0 %
HEMATOCRIT: 35.8 % — AB (ref 36.0–46.0)
HEMOGLOBIN: 11.7 g/dL — AB (ref 12.0–15.0)
LYMPHS ABS: 1.2 10*3/uL (ref 0.7–4.0)
LYMPHS PCT: 37 %
MCH: 29 pg (ref 26.0–34.0)
MCHC: 32.7 g/dL (ref 30.0–36.0)
MCV: 88.6 fL (ref 78.0–100.0)
MONO ABS: 0.3 10*3/uL (ref 0.1–1.0)
MONOS PCT: 9 %
NEUTROS ABS: 1.6 10*3/uL — AB (ref 1.7–7.7)
NEUTROS PCT: 54 %
Platelets: 147 10*3/uL — ABNORMAL LOW (ref 150–400)
RBC: 4.04 MIL/uL (ref 3.87–5.11)
RDW: 13.7 % (ref 11.5–15.5)
WBC: 3.1 10*3/uL — ABNORMAL LOW (ref 4.0–10.5)

## 2016-11-21 LAB — COMPREHENSIVE METABOLIC PANEL
ALT: 12 U/L — ABNORMAL LOW (ref 14–54)
ANION GAP: 11 (ref 5–15)
AST: 23 U/L (ref 15–41)
Albumin: 4.1 g/dL (ref 3.5–5.0)
Alkaline Phosphatase: 49 U/L (ref 38–126)
BILIRUBIN TOTAL: 0.5 mg/dL (ref 0.3–1.2)
BUN: 21 mg/dL — AB (ref 6–20)
CO2: 27 mmol/L (ref 22–32)
Calcium: 9.7 mg/dL (ref 8.9–10.3)
Chloride: 101 mmol/L (ref 101–111)
Creatinine, Ser: 1.02 mg/dL — ABNORMAL HIGH (ref 0.44–1.00)
GFR calc Af Amer: >60 mL/min (ref >60)
GFR, EST NON AFRICAN AMERICAN: 59 mL/min — AB (ref >60)
GLUCOSE: 105 mg/dL — AB (ref 65–99)
POTASSIUM: 4.1 mmol/L (ref 3.5–5.1)
Sodium: 139 mmol/L (ref 135–145)
TOTAL PROTEIN: 8.7 g/dL — AB (ref 6.5–8.1)

## 2016-11-21 MED ORDER — DENOSUMAB 120 MG/1.7ML ~~LOC~~ SOLN
120.0000 mg | Freq: Once | SUBCUTANEOUS | Status: AC
Start: 2016-11-21 — End: 2016-11-21
  Administered 2016-11-21: 120 mg via SUBCUTANEOUS
  Filled 2016-11-21: qty 1.7

## 2016-11-21 MED ORDER — IXAZOMIB CITRATE 3 MG PO CAPS: ORAL_CAPSULE | ORAL | 4 refills | Status: DC

## 2016-11-21 NOTE — Progress Notes (Signed)
Kelli Hurley presents today for injection per MD orders. Xgeva 120 mg administered SQ in left lower abdomen. Administration without incident. Patient tolerated well. Patient tolerated treatment without incidence. Patient discharged ambulatory and in stable condition from clinic. Patient to follow up as scheduled.

## 2016-11-21 NOTE — Patient Instructions (Signed)
[facility] at [facility] Discharge Instructions  RECOMMENDATIONS MADE BY THE CONSULTANT AND ANY TEST RESULTS WILL BE SENT TO YOUR REFERRING PHYSICIAN.  You received your Xgeva injection today Follow up as scheduled.  Thank you for choosing [facility] at [facility] to provide your oncology and hematology care.  To afford each patient quality time with our provider, please arrive at least 15 minutes before your scheduled appointment time.    If you have a lab appointment with the Cancer Center please come in thru the  Main Entrance and check in at the main information desk  You need to re-schedule your appointment should you arrive 10 or more minutes late.  We strive to give you quality time with our providers, and arriving late affects you and other patients whose appointments are after yours.  Also, if you no show three or more times for appointments you may be dismissed from the clinic at the providers discretion.     Again, thank you for choosing [facility].  Our hope is that these requests will decrease the amount of time that you wait before being seen by our physicians.       _____________________________________________________________  Should you have questions after your visit to [facility], please contact our office at [phone] between the hours of 8:30 a.m. and 4:30 p.m.  Voicemails left after 4:30 p.m. will not be returned until the following business day.  For prescription refill requests, have your pharmacy contact our office.       Resources For Cancer Patients and their Caregivers ? American Cancer Society: Can assist with transportation, wigs, general needs, runs Look Good Feel Better.        1-888-227-6333 ? Cancer Care: Provides financial assistance, online support groups, medication/co-pay assistance.  1-800-813-HOPE (4673) ? [facility] Assists [place]  cancer patients and their families through emotional , educational and financial support.  336-427-4357 ? [name] Where to apply for food stamps, Medicaid and utility assistance. 336-342-1394 ? [name]: Transportation to medical appointments. 336-347-2287 ? Social Security Administration: May apply for disability if have a Stage IV cancer. 336-342-7796 1-800-772-1213 ? [place] Aging, Disability and Transit Services: Assists with nutrition, care and transit needs. 336-349-2343  Cancer Center Support Programs: @[email or web address]@ > Cancer Support Group  2nd Tuesday of the month 1pm-2pm, Journey Room  > Creative Journey  3rd Tuesday of the month 1130am-1pm, Journey Room  > Look Good Feel Better  1st Wednesday of the month 10am-12 noon, Journey Room (Call American Cancer Society to register 1-800-395-5775)    

## 2016-11-21 NOTE — Progress Notes (Signed)
[place], [place] [address]   CLINIC:  Medical Oncology/Hematology Progress Note  PCP:  Mackey Birchwood [address] [place] [address] [phone]   REASON FOR VISIT:  Follow-up for h/o Multiple Myeloma s/p stem cell transplant on 05/07/15  CURRENT THERAPY: Maintenance Ninlaro & xgeva monthly   BRIEF ONCOLOGIC HISTORY:    Multiple myeloma ([place])   08/07/2014 Imaging    Bone Survey- No lytic lesions are noted in the visualized skeleton.      09/03/2014 Bone Marrow Biopsy    NORMOCELLULAR BONE MARROW FOR AGE WITH PLASMA CELL NEOPLASM.  The plasma cell component is increased in the marrow representing an estimated 18% of all cells. Cytogenetics with 13q-, 17p- (high risk disease)      09/03/2014 Pathology Results    Cytogenetics with 13q-, 17p- (high risk disease)      09/23/2014 Initial Diagnosis    Multiple myeloma      09/30/2014 PET scan    No abnormal hypermetabolism in the neck, chest, abdomen or pelvis.      10/05/2014 - 03/22/2015 Chemotherapy    RVD      10/28/2014 Treatment Plan Change    Issues related to getting Revlimid in a timely fashion, therefore, she received her Revlimid on 9/21 resulting in a 12 day cycle this time instead of a 14 day cycle      11/02/2014 Imaging    CTA chest- No evidence for a large or central pulmonary embolism as described.  8 mm density along the right minor fissure could represent focal pleural thickening but indeterminate. If the patient is at high risk for bronchogenic carcinoma, follow-up c      11/23/2014 Miscellaneous    Zometa 4 mg IV monthly      04/07/2015 Bone Marrow Biopsy    Normocellular marrow with 2-4% clonal plasma cells by immunohistochemistry. FISH and cytogenetics were normal [facility])        04/2015 Miscellaneous    PRETRANSPLANT EVALUATION:  Pulmonary function tests: FEV1 100.3% / DLCO 97.9%  Echocardiogram: Normal LV function with EF 60-65%         04/27/2015 Procedure    Stem cell mobilization with filgrastim and Mozobil [facility])      05/06/2015 Miscellaneous    BMT conditioning regimen with high-dose Melphalan given [name]); Day -1      05/07/2015 Bone Marrow Transplant    Outpatient autologous stem cell transplant [facility] [name]); Day 0      05/18/2015 Miscellaneous    WBC engraftment;  did not require platelet transfusion during her transplant process. Lowest platelet count 28,000       05/20/2015 Procedure    Tunneled catheter removed [facility])      09/08/2015 -  Chemotherapy    Velcade every 2 weeks      12/15/2015 Miscellaneous    Zometa re-instituted.       12/23/2015 Imaging    Bone density- BMD as determined from Forearm Radius 33% is 0.799 g/cm2 with a T-Score of 1.2. This patient is considered normal according to [facility]) criteria.      04/17/2016 Miscellaneous    Started maintenance with Ninlaro.      05/04/2016 Treatment Plan Change    Zometa switched to Xgeva injection monthly given difficult IV access.         INTERVAL HISTORY:  Ms.Northrop presents to the cancer center unaccompanied today for routine follow-up.  Patient  states that she has been doing well. She has been compliant with ninlaro and denies any side effects from the [facility]. She states she's noted some LE swelling which gets worse as the day progresses. She denies any chest pain, shortness breath, abdominal pain, focal weakness.  REVIEW OF SYSTEMS:  Review of Systems  Constitutional: Negative.  Negative for chills, fatigue and fever.  HENT:  Negative.   Eyes: Negative.   Respiratory: Negative for cough and shortness of breath.   Cardiovascular: Negative.  Negative for chest pain.  Gastrointestinal: Negative.  Negative for abdominal pain, constipation, diarrhea, nausea and vomiting.  Endocrine: Negative.   Genitourinary: Negative.    Musculoskeletal: Negative.   Skin: Negative.    Neurological: Negative.   Hematological: Negative.   Psychiatric/Behavioral: Negative.   All other systems reviewed and are negative.    PAST MEDICAL/SURGICAL HISTORY:  Past Medical History:  Diagnosis Date  . Anemia   . Claustrophobia 10/05/2014  . Hypertension   . Leukopenia 08/03/2014  . Normocytic hypochromic anemia 08/03/2014   Past Surgical History:  Procedure Laterality Date  . ABDOMINAL HYSTERECTOMY    . OTHER SURGICAL HISTORY     heart surgery as infant to "repair hole in heart"     SOCIAL HISTORY:  Social History   Social History  . Marital status: Legally Separated    Spouse name: N/A  . Number of children: N/A  . Years of education: N/A   Occupational History  . Not on file.   Social History Main Topics  . Smoking status: Never Smoker  . Smokeless tobacco: Never Used  . Alcohol use No  . Drug use: No  . Sexual activity: Not on file     Comment: divorced- 2 daughters   Other Topics Concern  . Not on file   Social History Narrative  . No narrative on file    FAMILY HISTORY:  Family History  Problem Relation Age of Onset  . Cancer Mother   . Hypertension Mother   . Cancer Father   . Hypertension Father   . Cancer Maternal Grandmother     CURRENT MEDICATIONS:  Outpatient Encounter Prescriptions as of 04/06/2016  Medication Sig  . acyclovir (ZOVIRAX) 800 MG tablet TAKE 1 TABLET BY MOUTH TWICE DAILY  . Bortezomib (VELCADE IJ) Inject as directed every 14 (fourteen) days.  . hydrochlorothiazide (HYDRODIURIL) 25 MG tablet Take 1 tablet (25 mg total) by mouth every morning.  . Multiple Vitamin (TAB-A-VITE) TABS TAKE 1 TABLET BY MOUTH ONCE DAILY.  Marland Kitchen potassium chloride SA (K-DUR,KLOR-CON) 20 MEQ tablet Take 1 tablet (20 mEq total) by mouth 2 (two) times daily.  Marland Kitchen PROAIR HFA 108 (90 Base) MCG/ACT inhaler Inhale 2 puffs into the lungs daily as needed.   . zolendronic acid (ZOMETA) 4 MG/5ML injection Inject 4 mg into the vein every 28 (twenty-eight)  days.   Facility-Administered Encounter Medications as of 04/06/2016  Medication  . heparin lock flush 100 unit/mL  . sodium chloride 0.9 % injection 10 mL    ALLERGIES:  No Known Allergies   PHYSICAL EXAM:  ECOG Performance status: 0  Vitals:   11/21/16 0909  BP: (!) 138/91  Pulse: 82  Resp: 18  SpO2: 99%   Filed Weights   11/21/16 0909  Weight: (!) 333 lb (151 kg)     Physical Exam  Constitutional: She is oriented to person, place, and time and well-developed, well-nourished, and in no distress.  HENT:  Head: Normocephalic and atraumatic.  Mouth/Throat: Oropharynx  is clear and moist. No oropharyngeal exudate.  Eyes: Pupils are equal, round, and reactive to light. Conjunctivae and EOM are normal. No scleral icterus.  Neck: Normal range of motion. Neck supple.  Cardiovascular: Normal rate, regular rhythm and normal heart sounds.   Pulmonary/Chest: Effort normal and breath sounds normal. No respiratory distress.  Abdominal: Soft. Bowel sounds are normal. There is no tenderness.  Musculoskeletal: Normal range of motion. She exhibits no edema.  Lymphadenopathy:    She has no cervical adenopathy.  Neurological: She is alert and oriented to person, place, and time. No cranial nerve deficit. Gait normal.  Skin: Skin is warm and dry.  Psychiatric: Mood, memory, affect and judgment normal.  Nursing note and vitals reviewed.    LABORATORY DATA:  I have reviewed the labs as listed.  CBC    Component Value Date/Time   WBC 3.1 (L) 11/21/2016 0829   RBC 4.04 11/21/2016 0829   HGB 11.7 (L) 11/21/2016 0829   HCT 35.8 (L) 11/21/2016 0829   PLT 147 (L) 11/21/2016 0829   MCV 88.6 11/21/2016 0829   MCH 29.0 11/21/2016 0829   MCHC 32.7 11/21/2016 0829   RDW 13.7 11/21/2016 0829   LYMPHSABS 1.2 11/21/2016 0829   MONOABS 0.3 11/21/2016 0829   EOSABS 0.0 11/21/2016 0829   BASOSABS 0.0 11/21/2016 0829   CMP Latest Ref Rng & Units 11/21/2016 10/24/2016 09/22/2016  Glucose 65 -  99 mg/dL 105(H) 98 101(H)  BUN 6 - 20 mg/dL 21(H) 18 17  Creatinine 0.44 - 1.00 mg/dL 1.02(H) 0.89 0.94  Sodium 135 - 145 mmol/L 139 140 137  Potassium 3.5 - 5.1 mmol/L 4.1 4.0 4.4  Chloride 101 - 111 mmol/L 101 104 103  CO2 22 - 32 mmol/L 27 28 27   Calcium 8.9 - 10.3 mg/dL 9.7 9.6 9.3  Total Protein 6.5 - 8.1 g/dL 8.7(H) 8.2(H) 8.2(H)  Total Bilirubin 0.3 - 1.2 mg/dL 0.5 0.4 0.5  Alkaline Phos 38 - 126 U/L 49 50 51  AST 15 - 41 U/L 23 20 19   ALT 14 - 54 U/L 12(L) 12(L) 14     Ref Range & Units 836moago 328mogo 52m537moo    Kappa free light chain 3.3 - 19.4 mg/L 18.7  18.7  19.5     Lamda free light chains 5.7 - 26.3 mg/L 12.3  11.3  12.9    Kappa, lamda light chain ratio 0.26 - 1.65 1.52  1.65CM  1.51CM      Ref Range & Units 37mo48mo (08/24/16) 37mo 349mo(08/24/16) 36mo a1mo6/21/18) 36mo ag549mo/21/18) 537mo ago5537mo24/18) 537mo ago 536mo4/18) 49mo ago (836mo/18)    Total Protein ELP 6.0 - 8.5 g/dL 7.3  7.6  6.9  7.0  7.5  7.8  7.5    Albumin ELP 2.9 - 4.4 g/dL 3.8   3.7   3.7   4.1    Alpha-1-Globulin 0.0 - 0.4 g/dL 0.2   0.2   0.3   0.2    Alpha-2-Globulin 0.4 - 1.0 g/dL 0.7   0.6   0.8   0.7    Beta Globulin 0.7 - 1.3 g/dL 1.1   1.0   1.2   1.1    Gamma Globulin 0.4 - 1.8 g/dL 1.5   1.3   1.6   1.4    M-Spike, % Not Observed g/dL 0.4    0.4    0.3    0.3     SPE Interp.  Comment  CommentCM   CommentCM   CommentCM        DIAGNOSTIC IMAGING:  I have personally reviewed the radiological images as listed and agreed with the findings in the report.   DEXA scan 12/23/2015   PATHOLOGY:     ASSESSMENT & PLAN:  Ms. Venier is a very pleasant 61 y.o. female with history of Multiple Myeloma with high-risk features (13q-, 17p-); treated with RVD therapy, then stem cell transplant at [facility] on 05/07/15.    Multiple myeloma s/p stem cell transplant:  -Continue maintenance ninlaro. New Rx printed for refill. -Myeloma labs pending at this time. Will follow up. So  far her M spike and Kappa/lambda light chain ratio has remained stable. -Continue follow up with BMT, Dr. Norma Fredrickson, at scheduled appt on 11/22/16.  Bone health:  -Continue xgeva, she will get a dose today. Xgeva q28 days.  Dispo: RTC in 3 months for continued follow up with labs.  Orders Placed This Encounter  Procedures  . CBC with Differential    Standing Status:   Future    Standing Expiration Date:   11/21/2017  . Comprehensive metabolic panel    Standing Status:   Future    Standing Expiration Date:   11/21/2017  . Kappa/lambda light chains    Standing Status:   Future    Standing Expiration Date:   11/21/2017  . Multiple Myeloma Panel (SPEP&IFE w/QIG)    Standing Status:   Future    Standing Expiration Date:   11/21/2017  . SCHEDULING COMMUNICATION    Schedule 15 minute injection appointment      All questions were answered to patient's stated satisfaction. Encouraged patient to call with any new concerns or questions before her next visit to the cancer center and we can certain see her sooner, if needed.    Twana First, MD

## 2016-11-22 LAB — PROTEIN ELECTROPHORESIS, SERUM
A/G RATIO SPE: 0.7 (ref 0.7–1.7)
ALPHA-1-GLOBULIN: 0.3 g/dL (ref 0.0–0.4)
ALPHA-2-GLOBULIN: 1.1 g/dL — AB (ref 0.4–1.0)
Albumin ELP: 3.4 g/dL (ref 2.9–4.4)
Beta Globulin: 1.3 g/dL (ref 0.7–1.3)
GLOBULIN, TOTAL: 4.8 g/dL — AB (ref 2.2–3.9)
Gamma Globulin: 2.1 g/dL — ABNORMAL HIGH (ref 0.4–1.8)
M-Spike, %: 0.6 g/dL — ABNORMAL HIGH
TOTAL PROTEIN ELP: 8.2 g/dL (ref 6.0–8.5)

## 2016-11-22 LAB — IGG, IGA, IGM
IGA: 217 mg/dL (ref 87–352)
IGG (IMMUNOGLOBIN G), SERUM: 1808 mg/dL — AB (ref 700–1600)
IgM (Immunoglobulin M), Srm: 53 mg/dL (ref 26–217)

## 2016-11-22 LAB — KAPPA/LAMBDA LIGHT CHAINS
KAPPA FREE LGHT CHN: 43 mg/L — AB (ref 3.3–19.4)
Kappa, lambda light chain ratio: 1.72 — ABNORMAL HIGH (ref 0.26–1.65)
LAMDA FREE LIGHT CHAINS: 25 mg/L (ref 5.7–26.3)

## 2016-11-22 LAB — BETA 2 MICROGLOBULIN, SERUM: BETA 2 MICROGLOBULIN: 4.4 mg/L — AB (ref 0.6–2.4)

## 2016-12-12 ENCOUNTER — Other Ambulatory Visit (HOSPITAL_COMMUNITY): Payer: Self-pay

## 2016-12-12 DIAGNOSIS — E876 Hypokalemia: Secondary | ICD-10-CM

## 2016-12-12 MED ORDER — POTASSIUM CHLORIDE CRYS ER 20 MEQ PO TBCR: 20.0000 meq | EXTENDED_RELEASE_TABLET | Freq: Two times a day (BID) | ORAL | 2 refills | Status: DC

## 2016-12-12 NOTE — Telephone Encounter (Signed)
Received refill request from patients pharmacy for potassium. Reviewed with provider, chart checked and refilled.  

## 2016-12-19 ENCOUNTER — Encounter (HOSPITAL_BASED_OUTPATIENT_CLINIC_OR_DEPARTMENT_OTHER): Payer: Medicare Other

## 2016-12-19 ENCOUNTER — Encounter (HOSPITAL_COMMUNITY): Payer: Medicare Other | Attending: Oncology

## 2016-12-19 ENCOUNTER — Encounter (HOSPITAL_COMMUNITY): Payer: Self-pay

## 2016-12-19 VITALS — BP 100/86 | HR 87 | Temp 97.8°F | Resp 18

## 2016-12-19 DIAGNOSIS — C9001 Multiple myeloma in remission: Secondary | ICD-10-CM | POA: Diagnosis present

## 2016-12-19 DIAGNOSIS — C9000 Multiple myeloma not having achieved remission: Secondary | ICD-10-CM

## 2016-12-19 LAB — COMPREHENSIVE METABOLIC PANEL
ALK PHOS: 44 U/L (ref 38–126)
ALT: 15 U/L (ref 14–54)
AST: 25 U/L (ref 15–41)
Albumin: 4.2 g/dL (ref 3.5–5.0)
Anion gap: 9 (ref 5–15)
BILIRUBIN TOTAL: 0.9 mg/dL (ref 0.3–1.2)
BUN: 24 mg/dL — ABNORMAL HIGH (ref 6–20)
CALCIUM: 10.5 mg/dL — AB (ref 8.9–10.3)
CHLORIDE: 102 mmol/L (ref 101–111)
CO2: 28 mmol/L (ref 22–32)
CREATININE: 1.29 mg/dL — AB (ref 0.44–1.00)
GFR, EST AFRICAN AMERICAN: 51 mL/min — AB (ref >60)
GFR, EST NON AFRICAN AMERICAN: 44 mL/min — AB (ref >60)
Glucose, Bld: 103 mg/dL — ABNORMAL HIGH (ref 65–99)
Potassium: 4.5 mmol/L (ref 3.5–5.1)
SODIUM: 139 mmol/L (ref 135–145)
TOTAL PROTEIN: 8.8 g/dL — AB (ref 6.5–8.1)

## 2016-12-19 LAB — CBC WITH DIFFERENTIAL/PLATELET
BASOS PCT: 0 %
Basophils Absolute: 0 10*3/uL (ref 0.0–0.1)
EOS ABS: 0 10*3/uL (ref 0.0–0.7)
EOS PCT: 1 %
HCT: 33.8 % — ABNORMAL LOW (ref 36.0–46.0)
Hemoglobin: 10.9 g/dL — ABNORMAL LOW (ref 12.0–15.0)
LYMPHS ABS: 1.3 10*3/uL (ref 0.7–4.0)
Lymphocytes Relative: 25 %
MCH: 28.6 pg (ref 26.0–34.0)
MCHC: 32.2 g/dL (ref 30.0–36.0)
MCV: 88.7 fL (ref 78.0–100.0)
Monocytes Absolute: 0.4 10*3/uL (ref 0.1–1.0)
Monocytes Relative: 7 %
Neutro Abs: 3.6 10*3/uL (ref 1.7–7.7)
Neutrophils Relative %: 67 %
PLATELETS: 203 10*3/uL (ref 150–400)
RBC: 3.81 MIL/uL — AB (ref 3.87–5.11)
RDW: 14.7 % (ref 11.5–15.5)
WBC: 5.3 10*3/uL (ref 4.0–10.5)

## 2016-12-19 MED ORDER — SODIUM CHLORIDE 0.9 % IV SOLN
INTRAVENOUS | Status: DC
  Administered 2016-12-19: 10:00:00 via INTRAVENOUS

## 2016-12-19 MED ORDER — SODIUM CHLORIDE 0.9% FLUSH
10.0000 mL | Freq: Once | INTRAVENOUS | Status: AC
  Administered 2016-12-19: 10 mL via INTRAVENOUS

## 2016-12-19 MED ORDER — ZOLEDRONIC ACID 4 MG/100ML IV SOLN
4.0000 mg | Freq: Once | INTRAVENOUS | Status: AC
  Administered 2016-12-19: 4 mg via INTRAVENOUS
  Filled 2016-12-19: qty 100

## 2016-12-19 NOTE — Patient Instructions (Signed)
[place] at [facility]  Discharge Instructions:  You had zometa today.   _______________________________________________________________  Thank you for choosing [place] at [facility] to provide your oncology and hematology care.  To afford each patient quality time with our providers, please arrive at least 15 minutes before your scheduled appointment.  You need to re-schedule your appointment if you arrive 10 or more minutes late.  We strive to give you quality time with our providers, and arriving late affects you and other patients whose appointments are after yours.  Also, if you no show three or more times for appointments you may be dismissed from the clinic.  Again, thank you for choosing [place] [facility] hope is that these requests will allow you access to exceptional care and in a timely manner. _______________________________________________________________  If you have questions after your visit, please contact our office at [phone] between the hours of 8:30 a.m. and 5:00 p.m. Voicemails left after 4:30 p.m. will not be returned until the following business day. _______________________________________________________________  For prescription refill requests, have your pharmacy contact our office. _______________________________________________________________  Recommendations made by the consultant and any test results will be sent to your referring physician. _______________________________________________________________

## 2016-12-19 NOTE — Progress Notes (Signed)
Patient for xgeva shot today.  Per patient her last visit with Phoebe Perch is switching her to Zometa.  Reviewed last note with Dr. Talbert Cage and will start the patient on Zometa every  3 months.  Pharmacy notified.    Patient stated she had mouth sores 2 weeks ago but have cleared up.  Denied fevers and chills.  She continues to have lower leg edema with a rash that itches. Also, stated her lower legs are sore to touch. She stated she has tried OTC medications and lotions with no relief.  Patient denied tooth pain or jaw pain. Taking calcium as directed.  Dr.  Talbert Cage made aware.  Patient instructed to try benadryl with hydrocortisone cream for relief of lower leg rash and itching per Dr. Talbert Cage and patient verbalized understanding.    New consent signed with all questions asked and answered.   Patient tolerated zometa with no complaints voiced.  Peripheral IV site clean and dry with no bruising or swelling noted at site.  Band aid applied.  VSS with discharge and left ambulatory with family.

## 2016-12-20 LAB — MULTIPLE MYELOMA PANEL, SERUM
ALBUMIN/GLOB SERPL: 0.7 (ref 0.7–1.7)
Albumin SerPl Elph-Mcnc: 3.3 g/dL (ref 2.9–4.4)
Alpha 1: 0.4 g/dL (ref 0.0–0.4)
Alpha2 Glob SerPl Elph-Mcnc: 1 g/dL (ref 0.4–1.0)
B-GLOBULIN SERPL ELPH-MCNC: 1.3 g/dL (ref 0.7–1.3)
GAMMA GLOB SERPL ELPH-MCNC: 2.4 g/dL — AB (ref 0.4–1.8)
GLOBULIN, TOTAL: 5.1 g/dL — AB (ref 2.2–3.9)
IGA: 238 mg/dL (ref 87–352)
IgG (Immunoglobin G), Serum: 2118 mg/dL — ABNORMAL HIGH (ref 700–1600)
IgM (Immunoglobulin M), Srm: 272 mg/dL — ABNORMAL HIGH (ref 26–217)
M Protein SerPl Elph-Mcnc: 0.6 g/dL — ABNORMAL HIGH
Total Protein ELP: 8.4 g/dL (ref 6.0–8.5)

## 2016-12-20 LAB — KAPPA/LAMBDA LIGHT CHAINS
KAPPA, LAMDA LIGHT CHAIN RATIO: 1.62 (ref 0.26–1.65)
Kappa free light chain: 58.6 mg/L — ABNORMAL HIGH (ref 3.3–19.4)
Lambda free light chains: 36.1 mg/L — ABNORMAL HIGH (ref 5.7–26.3)

## 2017-01-02 ENCOUNTER — Telehealth (HOSPITAL_COMMUNITY): Payer: Self-pay

## 2017-01-02 NOTE — Telephone Encounter (Signed)
Patient called concerning her right hand. She states it has been swelling for 5 days. States it is very painful but has not taken any medication for the pain. Denies warmth or discoloration to her hand. Denies any injury.states she has been elevating it at night. Reviewed with provider who said the swelling was not related to anything we do at the cancer center and for her to follow up with PCP. Notified patient who verbalized understanding.

## 2017-01-03 ENCOUNTER — Ambulatory Visit (INDEPENDENT_AMBULATORY_CARE_PROVIDER_SITE_OTHER): Payer: Medicare Other | Admitting: Orthopedic Surgery

## 2017-01-03 ENCOUNTER — Ambulatory Visit (INDEPENDENT_AMBULATORY_CARE_PROVIDER_SITE_OTHER): Payer: Medicare Other

## 2017-01-03 ENCOUNTER — Encounter: Payer: Self-pay | Admitting: Orthopedic Surgery

## 2017-01-03 VITALS — BP 146/90 | HR 112 | Ht 63.0 in | Wt 320.0 lb

## 2017-01-03 DIAGNOSIS — M25562 Pain in left knee: Secondary | ICD-10-CM

## 2017-01-03 DIAGNOSIS — G8929 Other chronic pain: Secondary | ICD-10-CM

## 2017-01-03 MED ORDER — DICLOFENAC SODIUM 50 MG PO TBEC: 50.0000 mg | DELAYED_RELEASE_TABLET | Freq: Two times a day (BID) | ORAL | 2 refills | Status: DC

## 2017-01-03 NOTE — Progress Notes (Signed)
NEW PATIENT OFFICE VISIT    Chief Complaint  Patient presents with  . New Patient (Initial Visit)    Left knee pain    61 year old female with history of multiple myeloma presents with 5-year history of left knee pain.  She says her right one hurts to but left one is worse.  She complains of 8 out of 10 dull aching non-radiating left anteromedial knee pain which is worse when she gets out of a chair associated with stiffness decreased range of motion.  She was [facility] with injections which did not help    Review of Systems  Constitutional: Negative.   Respiratory: Positive for shortness of breath.   Gastrointestinal: Negative.   Genitourinary: Negative.   Neurological: Negative.      Past Medical History:  Diagnosis Date  . Anemia   . Arthritis   . Cancer ([place])   . Claustrophobia 10/05/2014  . Hypertension   . Leukopenia 08/03/2014  . Normocytic hypochromic anemia 08/03/2014    Past Surgical History:  Procedure Laterality Date  . ABDOMINAL HYSTERECTOMY    . CARDIAC SURGERY    . OTHER SURGICAL HISTORY     heart surgery as infant to "repair hole in heart"    Family History  Problem Relation Age of Onset  . Cancer Mother   . Hypertension Mother   . Cancer Father   . Hypertension Father   . Cancer Maternal Grandmother   . Hypertension Sister   . Hypertension Brother    Social History   Tobacco Use  . Smoking status: Never Smoker  . Smokeless tobacco: Never Used  Substance Use Topics  . Alcohol use: No  . Drug use: No    No Known Allergies   Current Meds  Medication Sig  . acyclovir (ZOVIRAX) 800 MG tablet TAKE 1 TABLET BY MOUTH TWICE DAILY  . cholecalciferol (VITAMIN D) 1000 units tablet Take 1,000 Units by mouth daily.  . hydrochlorothiazide (HYDRODIURIL) 25 MG tablet Take 1 tablet (25 mg total) by mouth daily.  Marland Kitchen ibuprofen (ADVIL,MOTRIN) 200 MG tablet Take 200 mg by mouth every 6 (six) hours as needed.  . ixazomib citrate  (NINLARO) 3 MG capsule Take 1 capsule (39m) by mouth once a week on days 1, 8 & 15 every 28 day cycle. Take on empty stomach 1 hr before or 2 hrs after food  . Multiple Vitamin (TAB-A-VITE) TABS TAKE 1 TABLET BY MOUTH ONCE DAILY.  .Marland Kitchenpotassium chloride SA (K-DUR,KLOR-CON) 20 MEQ tablet Take 1 tablet (20 mEq total) 2 (two) times daily by mouth.  .Marland KitchenPROAIR HFA 108 (90 Base) MCG/ACT inhaler Inhale 2 puffs into the lungs daily as needed.   . zolendronic acid (ZOMETA) 4 MG/5ML injection Inject 4 mg into the vein every 28 (twenty-eight) days.    BP (!) 146/90   Pulse (!) 112   Ht 5' 3"  (1.6 m)   Wt (!) 320 lb (145.2 kg)   BMI 56.69 kg/m   Physical Exam  Constitutional: She is oriented to person, place, and time. She appears well-developed and well-nourished.  Musculoskeletal:       Right knee: She exhibits decreased range of motion and abnormal alignment. She exhibits no swelling, no effusion, no ecchymosis, no deformity, no laceration, no erythema, no LCL laxity, normal patellar mobility, no bony tenderness, normal meniscus and no MCL laxity. No tenderness found. No medial joint line, no lateral joint line, no MCL, no LCL and no patellar tendon tenderness noted.  Left knee: She exhibits decreased range of motion, abnormal alignment, abnormal patellar mobility and bony tenderness. She exhibits no swelling, no effusion, no ecchymosis, no deformity, no laceration, no erythema, no LCL laxity, normal meniscus and no MCL laxity. Tenderness found. Medial joint line tenderness noted. No lateral joint line, no MCL, no LCL and no patellar tendon tenderness noted.       Legs: Neurological: She is alert and oriented to person, place, and time. She has normal strength. She displays no atrophy and no tremor. No sensory deficit. She exhibits normal muscle tone. She displays no seizure activity. Gait abnormal.  Reflex Scores:      Patellar reflexes are 2+ on the right side and 2+ on the left side. Antalgic knee    Psychiatric: She has a normal mood and affect. Judgment normal.  Vitals reviewed.   Right Knee Exam   Other  Effusion: no effusion present   Left Knee Exam   Other  Effusion: no effusion present       Imaging shows severe arthritis with moderate varus alignment   Meds ordered this encounter  Medications  . diclofenac (VOLTAREN) 50 MG EC tablet    Sig: Take 1 tablet (50 mg total) by mouth 2 (two) times daily.    Dispense:  60 tablet    Refill:  2    Encounter Diagnoses  Name Primary?  . Chronic pain of left knee Yes  . Severe obesity (BMI >= 40) (HCC)      PLAN:   Nutrition consult start diclofenac 50 mg twice a day  Goal weight 220 pounds  Follow-up 6 months check weight and progress regarding weight loss  Patient will need left total knee replacement when BMI is appropriate

## 2017-01-16 ENCOUNTER — Other Ambulatory Visit (HOSPITAL_COMMUNITY): Payer: Self-pay | Admitting: Oncology

## 2017-01-16 ENCOUNTER — Other Ambulatory Visit (HOSPITAL_COMMUNITY): Payer: Medicare Other

## 2017-01-16 ENCOUNTER — Ambulatory Visit (HOSPITAL_COMMUNITY): Payer: Medicare Other

## 2017-01-18 DIAGNOSIS — R601 Generalized edema: Secondary | ICD-10-CM | POA: Diagnosis not present

## 2017-01-23 ENCOUNTER — Other Ambulatory Visit (HOSPITAL_COMMUNITY): Payer: Self-pay | Admitting: Oncology

## 2017-01-23 DIAGNOSIS — C9000 Multiple myeloma not having achieved remission: Secondary | ICD-10-CM

## 2017-01-23 DIAGNOSIS — Z79899 Other long term (current) drug therapy: Secondary | ICD-10-CM

## 2017-02-02 ENCOUNTER — Encounter (HOSPITAL_COMMUNITY): Payer: Self-pay | Admitting: Emergency Medicine

## 2017-02-02 ENCOUNTER — Emergency Department (HOSPITAL_COMMUNITY)
Admission: EM | Admit: 2017-02-02 | Discharge: 2017-02-02 | Disposition: A | Discharge status: Home / Self Care | Payer: Medicare Other | Attending: Emergency Medicine | Admitting: Emergency Medicine

## 2017-02-02 ENCOUNTER — Other Ambulatory Visit: Payer: Self-pay

## 2017-02-02 DIAGNOSIS — I129 Hypertensive chronic kidney disease with stage 1 through stage 4 chronic kidney disease, or unspecified chronic kidney disease: Secondary | ICD-10-CM | POA: Diagnosis not present

## 2017-02-02 DIAGNOSIS — Z8579 Personal history of other malignant neoplasms of lymphoid, hematopoietic and related tissues: Secondary | ICD-10-CM | POA: Diagnosis not present

## 2017-02-02 DIAGNOSIS — R609 Edema, unspecified: Secondary | ICD-10-CM | POA: Diagnosis not present

## 2017-02-02 DIAGNOSIS — T50905A Adverse effect of unspecified drugs, medicaments and biological substances, initial encounter: Secondary | ICD-10-CM | POA: Insufficient documentation

## 2017-02-02 DIAGNOSIS — R220 Localized swelling, mass and lump, head: Secondary | ICD-10-CM | POA: Insufficient documentation

## 2017-02-02 DIAGNOSIS — N183 Chronic kidney disease, stage 3 (moderate): Secondary | ICD-10-CM | POA: Diagnosis not present

## 2017-02-02 DIAGNOSIS — Z79899 Other long term (current) drug therapy: Secondary | ICD-10-CM | POA: Diagnosis not present

## 2017-02-02 DIAGNOSIS — T887XXA Unspecified adverse effect of drug or medicament, initial encounter: Secondary | ICD-10-CM

## 2017-02-02 HISTORY — DX: Disorder of kidney and ureter, unspecified: N28.9

## 2017-02-02 NOTE — ED Notes (Signed)
HB in to assess 

## 2017-02-02 NOTE — ED Provider Notes (Signed)
[facility] EMERGENCY DEPARTMENT Provider Note   CSN: [id number] Arrival date & time: 02/02/17  1711     History   Chief Complaint Chief Complaint  Patient presents with  . Facial Swelling    HPI Kelli Hurley is a 61 y.o. female.  Patient is a 61 year old female who presents to the emergency department with facial swelling.  Patient has a history of multiple myeloma, arthritis, leukopenia, renal disorder, and primary osteoarthritis of the knees.  The patient states that she has had some swelling of her face since the beginning of December.  She feels that this problem is getting worse.  She has noted swelling of her lips, swelling of her eyelids, and she is also noticed some swelling of her hands and some soreness of her.  She has had some mild changes in her orthopedic medications but other than that she has not had any real changes in medication.  She has not had any changes in her diet.  She is not had any changes in her environment recently.  It is of note that she has been diagnosed with some renal disease issues and the patient and her family were concerned that the renal problems may be causing the facial swelling.  The patient denies any difficulty with breathing, swallowing, or speaking.  She has not had any excessive shortness of breath.  She is not noted excessive swelling of the lower extremities.      Past Medical History:  Diagnosis Date  . Anemia   . Arthritis   . Cancer ([place])   . Claustrophobia 10/05/2014  . Hypertension   . Leukopenia 08/03/2014  . Normocytic hypochromic anemia 08/03/2014  . Renal disorder    stage 3     Patient Active Problem List   Diagnosis Date Noted  . Claustrophobia 10/05/2014  . Multiple myeloma ([place]) 09/23/2014  . Normocytic hypochromic anemia 08/03/2014  . Leukopenia 08/03/2014  . Primary osteoarthritis of both knees 11/18/2013    Past Surgical History:  Procedure Laterality Date  . ABDOMINAL HYSTERECTOMY    .  CARDIAC SURGERY    . OTHER SURGICAL HISTORY     heart surgery as infant to "repair hole in heart"    OB History    No data available       Home Medications    Prior to Admission medications   Medication Sig Start Date End Date Taking? Authorizing Provider  acyclovir (ZOVIRAX) 800 MG tablet TAKE 1 TABLET BY MOUTH TWICE DAILY 01/23/17   Holley Bouche, NP  cholecalciferol (VITAMIN D) 1000 units tablet Take 1,000 Units by mouth daily.    [provider]  diclofenac (VOLTAREN) 50 MG EC tablet Take 1 tablet (50 mg total) by mouth 2 (two) times daily. 01/03/17   Carole Civil, MD  hydrochlorothiazide (HYDRODIURIL) 25 MG tablet Take 1 tablet (25 mg total) by mouth daily. 08/24/16   Twana First, MD  ibuprofen (ADVIL,MOTRIN) 200 MG tablet Take 200 mg by mouth every 6 (six) hours as needed.    [provider]  ixazomib citrate (NINLARO) 3 MG capsule Take 1 capsule ('3mg'$ ) by mouth once a week on days 1, 8 & 15 every 28 day cycle. Take on empty stomach 1 hr before or 2 hrs after food 11/21/16   Twana First, MD  Multiple Vitamin (TAB-A-VITE) TABS TAKE 1 TABLET BY MOUTH ONCE DAILY. 04/18/16   Baird Cancer, PA-C  potassium chloride SA (K-DUR,KLOR-CON) 20 MEQ tablet Take 1 tablet (20 mEq  total) 2 (two) times daily by mouth. 12/12/16   Twana First, MD  PROAIR HFA 108 [phone] Base) MCG/ACT inhaler Inhale 2 puffs into the lungs daily as needed.  12/18/15   [provider]  zolendronic acid (ZOMETA) 4 MG/5ML injection Inject 4 mg into the vein every 28 (twenty-eight) days. 03/08/16   [provider]    Family History Family History  Problem Relation Age of Onset  . Cancer Mother   . Hypertension Mother   . Cancer Father   . Hypertension Father   . Cancer Maternal Grandmother   . Hypertension Sister   . Hypertension Brother     Social History Social History   Tobacco Use  . Smoking status: Never Smoker  . Smokeless tobacco: Never Used  Substance  Use Topics  . Alcohol use: No  . Drug use: No     Allergies   Patient has no known allergies.   Review of Systems Review of Systems  Constitutional: Negative for activity change.       All ROS Neg except as noted in HPI  HENT: Negative for nosebleeds.   Eyes: Negative for photophobia and discharge.  Respiratory: Negative for cough, shortness of breath and wheezing.   Cardiovascular: Negative for chest pain and palpitations.  Gastrointestinal: Negative for abdominal pain and blood in stool.  Genitourinary: Negative for dysuria, frequency and hematuria.  Musculoskeletal: Positive for arthralgias. Negative for back pain and neck pain.  Skin: Negative.        Facial swelling  Neurological: Negative for dizziness, seizures and speech difficulty.  Psychiatric/Behavioral: Negative for confusion and hallucinations.     Physical Exam Updated Vital Signs BP 122/67 (BP Location: Right Arm)   Pulse 98   Temp (!) 97.5 F (36.4 C) (Tympanic)   Resp 20   Ht '5\' 2"'$  (1.575 m)   Wt (!) 142 kg (313 lb)   SpO2 94%   BMI 57.25 kg/m   Physical Exam  Constitutional: She is oriented to person, place, and time. She appears well-developed and well-nourished.  Non-toxic appearance. No distress.  HENT:  Head: Normocephalic and atraumatic.  Right Ear: Tympanic membrane and external ear normal.  Left Ear: Tympanic membrane and external ear normal.  Eyes: Conjunctivae, EOM and lids are normal. Pupils are equal, round, and reactive to light. Right eye exhibits no discharge. Left eye exhibits no discharge. No scleral icterus.  There is swelling of the left eyelids.  Some mild swelling of the left face, and swelling of the lips.  The airway is patent.  There is no swelling of the tongue.  Neck: Normal range of motion. Neck supple. Carotid bruit is not present. No tracheal deviation present.  Cardiovascular: Normal rate, regular rhythm, normal heart sounds, intact distal pulses and normal pulses. Exam  reveals no gallop and no friction rub.  Pulmonary/Chest: Effort normal and breath sounds normal. No stridor. No respiratory distress. She has no wheezes. She has no rales.  Abdominal: Soft. Bowel sounds are normal. She exhibits no distension. There is no tenderness. There is no rebound and no guarding.  Musculoskeletal: She exhibits no deformity.  There is mild puffiness of the hands.  Stiffness with attempted range of motion.  There is no pitting edema of the lower extremities.  There is mild puffiness of the ankles.  No temperature changes of the lower extremities.  Lymphadenopathy:       Head (right side): No submandibular adenopathy present.       Head (left side):  No submandibular adenopathy present.    She has no cervical adenopathy.  Neurological: She is alert and oriented to person, place, and time. She has normal strength. No cranial nerve deficit or sensory deficit. She exhibits normal muscle tone. She displays no seizure activity. Coordination normal.  Skin: Skin is warm and dry. No rash noted.  Psychiatric: She has a normal mood and affect. Her speech is normal.  Nursing note and vitals reviewed.    ED Treatments / Results  Labs (all labs ordered are listed, but only abnormal results are displayed) Labs Reviewed - No data to display  EKG  EKG Interpretation None       Radiology No results found.  Procedures Procedures (including critical care time)  Medications Ordered in ED Medications - No data to display   Initial Impression / Assessment and Plan / ED Course  I have reviewed the triage vital signs and the nursing notes.  Pertinent labs & imaging results that were available during my care of the patient were reviewed by me and considered in my medical decision making (see chart for details).       Final Clinical Impressions(s) / ED Diagnoses MDM Patient has been having problems with facial swelling since the beginning of December.  She states that it  would come for a while and then leave and then come back.  She denies any difficulty with swallowing.  There is been no difficulty with breathing.  She does have some stiffness and mild swelling she says of the hands.  Patient has been recently diagnosed with renal changes, but I find no systemic swelling.  The patient does not have any tenderness over the liver area or history of any liver disease.  And there is no systemic swelling noted.  No high fever reported.  The patient has been evaluated by oncology and was told that the swelling was not related to the transplant.  Review of the medications shows that the patient is on chemotherapy type pills.  I question if these may be the culprit of the patient's swelling.  I have asked the patient to follow-up with her oncology physician and her primary physician concerning this.  I have asked her to return to the emergency department immediately if any difficulty with breathing, difficulty with swallowing, or changes in her general condition.  Questions were answered.  The patient and family are in agreement with this plan.   Final diagnoses:  Facial swelling  Non-dose-related adverse effect of medication, initial encounter    ED Discharge Orders    None       Lily Kocher, PA-C 02/02/17 2300    Noemi Chapel, MD 02/04/17 1009

## 2017-02-02 NOTE — ED Triage Notes (Signed)
Swelling to lt eye, lips, hands and raised red rash to chest, progressively getting worse over a week.

## 2017-02-02 NOTE — Discharge Instructions (Signed)
Your vital signs are within normal limits.  Your examination questions an adverse response to your medication, particularly the possibility of an adverse reaction to your chemotherapy pill.  Please discuss this with your oncology doctor and your primary physician.  Please return to the emergency department if any difficulty with swallowing, breathing, or excessive swelling.

## 2017-02-02 NOTE — ED Notes (Signed)
Pt reports swelling since beginning of Dec Seen by [facility] [place] and told to take benadryl, then told not to take She reports stage 3 renal failure  Denies any change of meds save "arthritis medicine" that she took for 2 weeks before she started swelling

## 2017-02-08 NOTE — Progress Notes (Signed)
Kelli Hurley, [place] [address]   CLINIC:  Medical Oncology/Hematology  PCP:  Abran Richard, MD [address] [place] [address] [phone]   REASON FOR VISIT:  Follow-up for h/o Multiple Myeloma s/p stem cell transplant on 05/07/15  CURRENT THERAPY: Maintenance Ixaxomib (Ninlaro) 3 mg po on Days 1, 8, & 15 beginning 04/17/16 & Xgeva inj monthly   BRIEF ONCOLOGIC HISTORY:    Multiple myeloma ([place])   08/07/2014 Imaging    Bone Survey- No lytic lesions are noted in the visualized skeleton.      09/03/2014 Bone Marrow Biopsy    NORMOCELLULAR BONE MARROW FOR AGE WITH PLASMA CELL NEOPLASM.  The plasma cell component is increased in the marrow representing an estimated 18% of all cells. Cytogenetics with 13q-, 17p- (high risk disease)      09/03/2014 Pathology Results    Cytogenetics with 13q-, 17p- (high risk disease)      09/23/2014 Initial Diagnosis    Multiple myeloma      09/30/2014 PET scan    No abnormal hypermetabolism in the neck, chest, abdomen or pelvis.      10/05/2014 - 03/22/2015 Chemotherapy    RVD      10/28/2014 Treatment Plan Change    Issues related to getting Revlimid in a timely fashion, therefore, she received her Revlimid on 9/21 resulting in a 12 day cycle this time instead of a 14 day cycle      11/02/2014 Imaging    CTA chest- No evidence for a large or central pulmonary embolism as described.  8 mm density along the right minor fissure could represent focal pleural thickening but indeterminate. If the patient is at high risk for bronchogenic carcinoma, follow-up c      11/23/2014 Miscellaneous    Zometa 4 mg IV monthly      04/07/2015 Bone Marrow Biopsy    Normocellular marrow with 2-4% clonal plasma cells by immunohistochemistry. FISH and cytogenetics were normal [facility])        04/2015 Miscellaneous    PRETRANSPLANT EVALUATION:  Pulmonary function tests: FEV1 100.3% / DLCO 97.9%  Echocardiogram:  Normal LV function with EF 60-65%       04/27/2015 Procedure    Stem cell mobilization with filgrastim and Mozobil [facility])      05/06/2015 Miscellaneous    BMT conditioning regimen with high-dose Melphalan given [facility] [name]); Day -1      05/07/2015 Bone Marrow Transplant    Outpatient autologous stem cell transplant [facility] [name]); Day 0      05/18/2015 Miscellaneous    WBC engraftment;  did not require platelet transfusion during her transplant process. Lowest platelet count 28,000       05/20/2015 Procedure    Tunneled catheter removed [facility])      09/08/2015 -  Chemotherapy    Velcade every 2 weeks      12/15/2015 Miscellaneous    Zometa re-instituted.       12/23/2015 Imaging    Bone density- BMD as determined from Forearm Radius 33% is 0.799 g/cm2 with a T-Score of 1.2. This patient is considered normal according to [facility]) criteria.      04/17/2016 Miscellaneous    Started maintenance with Ninlaro.      05/04/2016 Treatment Plan Change    Zometa switched to Xgeva injection monthly given difficult IV access.         INTERVAL HISTORY:  Kelli Hurley 62 y.o. female  returns for routine follow-up for multiple myeloma s/p autologous stem cell transplant.   Recent visits to both [facility] EDs for rash to her face and upper chest. She stopped Ninlaro when the rash started around Christmas time (~2 weeks ago). She had also started Diclofenac around that time for joint pain; unclear which medication may have causes rash (Ninlaro or Diclofenac).  Her family is here with her today. They are concerned that she has not had as much energy and has not had as good of an appetite in recent weeks.  Chart reviewed; she has lost ~30 lbs in the past 3 months unintentionally.  She has felt more weak as a result.  She has not tried any of the nutritional supplements like Boost/Ensure; she does not like the taste of them and is  not willing to drink them regularly.       REVIEW OF SYSTEMS:  Review of Systems  Constitutional: Positive for appetite change, fatigue and unexpected weight change. Negative for chills and fever.  HENT:  Negative.   Eyes: Negative.   Respiratory: Negative.   Cardiovascular: Positive for leg swelling.  Gastrointestinal: Negative.   Endocrine: Negative.   Genitourinary: Negative.    Musculoskeletal: Positive for arthralgias.  Skin: Positive for itching and rash.  Neurological: Negative.   Hematological: Negative.   Psychiatric/Behavioral: Negative.      PAST MEDICAL/SURGICAL HISTORY:  Past Medical History:  Diagnosis Date  . Anemia   . Arthritis   . Cancer ([place])   . Claustrophobia 10/05/2014  . Hypertension   . Leukopenia 08/03/2014  . Normocytic hypochromic anemia 08/03/2014  . Renal disorder    stage 3    Past Surgical History:  Procedure Laterality Date  . ABDOMINAL HYSTERECTOMY    . CARDIAC SURGERY    . OTHER SURGICAL HISTORY     heart surgery as infant to "repair hole in heart"     SOCIAL HISTORY:  Social History   Socioeconomic History  . Marital status: Legally Separated    Spouse name: Not on file  . Number of children: Not on file  . Years of education: Not on file  . Highest education level: Not on file  Social Needs  . Financial resource strain: Not on file  . Food insecurity - worry: Not on file  . Food insecurity - inability: Not on file  . Transportation needs - medical: Not on file  . Transportation needs - non-medical: Not on file  Occupational History  . Not on file  Tobacco Use  . Smoking status: Never Smoker  . Smokeless tobacco: Never Used  Substance and Sexual Activity  . Alcohol use: No  . Drug use: No  . Sexual activity: Not on file    Comment: divorced- 2 daughters  Other Topics Concern  . Not on file  Social History Narrative  . Not on file    FAMILY HISTORY:  Family History  Problem Relation Age of Onset  . Cancer  Mother   . Hypertension Mother   . Cancer Father   . Hypertension Father   . Cancer Maternal Grandmother   . Hypertension Sister   . Hypertension Brother     CURRENT MEDICATIONS:  Outpatient Encounter Medications as of 02/13/2017  Medication Sig  . acyclovir (ZOVIRAX) 800 MG tablet TAKE 1 TABLET BY MOUTH TWICE DAILY  . cholecalciferol (VITAMIN D) 1000 units tablet Take 1,000 Units by mouth daily.  . diclofenac (VOLTAREN) 50 MG EC tablet Take 1  tablet (50 mg total) by mouth 2 (two) times daily.  . hydrochlorothiazide (HYDRODIURIL) 25 MG tablet Take 1 tablet (25 mg total) by mouth daily.  . ixazomib citrate (NINLARO) 3 MG capsule Take 1 capsule ('3mg'$ ) by mouth once a week on days 1, 8 & 15 every 28 day cycle. Take on empty stomach 1 hr before or 2 hrs after food  . Multiple Vitamin (TAB-A-VITE) TABS TAKE 1 TABLET BY MOUTH ONCE DAILY.  Marland Kitchen potassium chloride SA (K-DUR,KLOR-CON) 20 MEQ tablet Take 1 tablet (20 mEq total) 2 (two) times daily by mouth.  Marland Kitchen PROAIR HFA 108 (90 Base) MCG/ACT inhaler Inhale 2 puffs into the lungs daily as needed.   . zolendronic acid (ZOMETA) 4 MG/5ML injection Inject 4 mg into the vein every 28 (twenty-eight) days.  . [DISCONTINUED] hydrochlorothiazide (HYDRODIURIL) 25 MG tablet Take 1 tablet (25 mg total) by mouth daily.  . [DISCONTINUED] ibuprofen (ADVIL,MOTRIN) 200 MG tablet Take 200 mg by mouth every 6 (six) hours as needed.  . magnesium oxide (MAG-OX) 400 (241.3 Mg) MG tablet Take 1 tablet (400 mg total) by mouth 2 (two) times daily.  . [DISCONTINUED] acyclovir (ZOVIRAX) 800 MG tablet TAKE 1 TABLET BY MOUTH TWICE DAILY   Facility-Administered Encounter Medications as of 02/13/2017  Medication  . heparin lock flush 100 unit/mL  . sodium chloride 0.9 % injection 10 mL    ALLERGIES:  No Known Allergies   PHYSICAL EXAM:  ECOG Performance status: 1 - Symptomatic; remains largely independent   Physical Exam  Constitutional: She is oriented to person, place, and  time and well-developed, well-nourished, and in no distress.  Exam done with patient seated in chair  HENT:  Head: Normocephalic.  Mouth/Throat: Oropharynx is clear and moist. No oropharyngeal exudate.  (L) eyelid mildly swollen  Eyes: Conjunctivae are normal. Pupils are equal, round, and reactive to light. No scleral icterus.  Neck: Normal range of motion. Neck supple.  Cardiovascular: Normal rate and regular rhythm.  Pulmonary/Chest: Effort normal and breath sounds normal. No respiratory distress.  Abdominal: Soft. Bowel sounds are normal. There is no tenderness.  Musculoskeletal: Normal range of motion. She exhibits edema (1+ BLE edema).  Lymphadenopathy:    She has no cervical adenopathy.  Neurological: She is alert and oriented to person, place, and time. No cranial nerve deficit.  Skin: Skin is warm and dry. Rash (healing rash to bilateral cheeks and skin on ears (left worse than right); lesions progressing towards healing; no open lesions) noted.  Psychiatric: Mood, memory, affect and judgment normal.  Nursing note and vitals reviewed.    LABORATORY DATA:  I have reviewed the labs as listed.  CBC    Component Value Date/Time   WBC 4.2 02/13/2017 1053   RBC 3.31 (L) 02/13/2017 1053   HGB 9.3 (L) 02/13/2017 1053   HCT 28.9 (L) 02/13/2017 1053   PLT 139 (L) 02/13/2017 1053   MCV 87.3 02/13/2017 1053   MCH 28.1 02/13/2017 1053   MCHC 32.2 02/13/2017 1053   RDW 17.3 (H) 02/13/2017 1053   LYMPHSABS 1.0 02/13/2017 1053   MONOABS 0.6 02/13/2017 1053   EOSABS 0.0 02/13/2017 1053   BASOSABS 0.0 02/13/2017 1053   CMP Latest Ref Rng & Units 02/13/2017 12/19/2016 11/21/2016  Glucose 65 - 99 mg/dL 100(H) 103(H) 105(H)  BUN 6 - 20 mg/dL 20 24(H) 21(H)  Creatinine 0.44 - 1.00 mg/dL 1.21(H) 1.29(H) 1.02(H)  Sodium 135 - 145 mmol/L 137 139 139  Potassium 3.5 - 5.1 mmol/L 4.3 4.5  4.1  Chloride 101 - 111 mmol/L 101 102 101  CO2 22 - 32 mmol/L '24 28 27  '$ Calcium 8.9 - 10.3 mg/dL 10.2  10.5(H) 9.7  Total Protein 6.5 - 8.1 g/dL 8.7(H) 8.8(H) 8.7(H)  Total Bilirubin 0.3 - 1.2 mg/dL 0.6 0.9 0.5  Alkaline Phos 38 - 126 U/L 42 44 49  AST 15 - 41 U/L 32 25 23  ALT 14 - 54 U/L 23 15 12(L)     PENDING LABS:  Myeloma labs pending for today     DIAGNOSTIC IMAGING:  DEXA scan: 12/23/15   PATHOLOGY:     ASSESSMENT & PLAN:  [name] is a very pleasant 62 y.o. female with history of Multiple Myeloma with high-risk features (13q-, 17p-); treated with RVD therapy, then stem cell transplant at [facility] on 05/07/15.  She was on maintenance Velcade every 2 weeks, which was restarted after transplant on 09/08/15, with plans to continue to a total of 2 years. She was also receiving Zometa monthly. In early 04/2016, trending increase in kappa light chain values with slow increase in M-spike as well. Discussed with Dr. Norma Fredrickson at [facility] and he recommended increasing frequency of Velcade scheduling vs try to get Ixaxomib Kennieth Rad) approved by insurance, if able, in lieu of Velcade.  Ninlaro approved by insurance and started on 04/17/16.    Multiple myeloma s/p stem cell transplant:  -Based on Dr. Rossie Muskrat recommendations, patient was started on Ninlaro 3 mg on Days 1, 8, & 15 every 28 days; Ninlaro started 04/17/16.  -About 2 weeks ago, she began have facial rash with (L) orbital edema.  She was seen in ED at [facility] and at [facility]; she states she was told to hold her Ninlaro at both facilities until being seen today.  Her rash has since improved with steroid cream (see below).  -Resume Ninlaro as previously prescribed beginning tomorrow, 02/14/17. If rash recurs, gave her instructions to call us and let us know.   -Myeloma labs are pending.  Most recent M-spike 0.6% in 12/2016; per Dr. Rossie Muskrat previous recommendations, he would not consider change in therapy until her M-spike is 0.8 or greater.  -Return to cancer center in 1 month for follow-up with labs and Zometa  infusion.  Rash:  -She has held [employer] since recent evaluation at [facility] ED and at [facility] ED.  Per patient, she was instructed by ED providers to hold Ninlaro until her evaluation here at the cancer center today.  She saw dermatologist, Dr. Lorain Childes at [facility] on 02/09/17; Dr. Lorain Childes felt that her rash was more likely eczema vs contact dermatitis vs drug reaction from Diclofenac (as this was her only new medication preceding the start of her rash. Dermatology did not feel that rash is d/t Ninlaro.  -Clinically, steroid ointments have been helpful. Her rash appears to be healing. No evidence of new lesions since stopping both the Ninlaro and Diclofenac.  -Will have her restart Ninlaro. If no recurrence of rash, then will likely need to add Diclofenac to her allergy list.   Anemia:  -Progressive anemia appreciated on serial lab review.  Hgb 9.3 g/dL today. Denies any frank bleeding episodes. This is likely disease/treatment related. However, I will see if I can add-on anemia panel to labs already collected today, as patient does not want to be stuck again today.  LPN spoke with lab; unfortunately, they are unable to add-on anemia panel.  Will collect when she returns to cancer center in 1  month.   Weight loss:  -~30 lb weight loss noted in the past 3 months; this has largely been unintentional. She notes decreased appetite and taste changes as her biggest challenges to maintaining her weight. She states that she would like to lose weight, "but not like this."   -Encouraged her to consider using nutritional supplements like Boost/Ensure. She is adamant that she cannot tolerate these d/t taste.  She was willing to try some of the Ensure "clear" samples today; she liked the [employer]. Recommended she begin supplementing her diet with these as needed.  -Will ask our dietitian team to see the patient at future follow-up visits as well for continued support and additional recommendations.    Hypomagnesemia:  -Possibly d/t nutritional deficiency and recent weight loss.  -Mag low at 1.4 today.  E-scribed Mag Ox 400 mg BID x 2 weeks to her pharmacy. Will recheck at her next follow-up visit in 1 month.   Bone health:  -Zometa switched to monthly Xgeva given difficult IV access.  Then, switched back to Zometa, but with every 3 month frequency in 12/2016 per MD recommendations.  Oncology Flowsheet 12/19/2016  Zoledronic Acid (ZOMETA) IV 4 mg  -Will be due for next Zometa infusion in 03/2017.  -She will be due for repeat DEXA imaging in 12/2017; will place orders as subsequent follow-up visits.        Dispo:  -Referral to dietitian for weight loss.  -Return to cancer center in ~1 month for follow-up with labs and next Zometa infusion.    All questions were answered to patient's stated satisfaction. Encouraged patient to call with any new concerns or questions before her next visit to the cancer center and we can certain see her sooner, if needed.       Orders placed this encounter:  Orders Placed This Encounter  Procedures  . Vitamin B12  . Folate  . Iron and TIBC  . Ferritin  . CBC with Differential/Platelet  . Comprehensive metabolic panel  . Magnesium  . Kappa/lambda light chains  . Immunofixation electrophoresis  . IgG, IgA, IgM  . Protein electrophoresis, serum      Mike Craze, NP [place] [phone]

## 2017-02-12 ENCOUNTER — Other Ambulatory Visit (HOSPITAL_COMMUNITY): Payer: Self-pay | Admitting: *Deleted

## 2017-02-12 DIAGNOSIS — C9000 Multiple myeloma not having achieved remission: Secondary | ICD-10-CM

## 2017-02-13 ENCOUNTER — Ambulatory Visit (HOSPITAL_COMMUNITY): Payer: Medicare Other

## 2017-02-13 ENCOUNTER — Encounter (HOSPITAL_COMMUNITY): Payer: Self-pay | Admitting: Adult Health

## 2017-02-13 ENCOUNTER — Encounter: Payer: Self-pay | Admitting: Nutrition

## 2017-02-13 ENCOUNTER — Other Ambulatory Visit: Payer: Self-pay

## 2017-02-13 ENCOUNTER — Encounter: Payer: Medicare Other | Attending: Orthopedic Surgery | Admitting: Nutrition

## 2017-02-13 ENCOUNTER — Inpatient Hospital Stay (HOSPITAL_COMMUNITY): Payer: Medicare Other | Attending: Hematology and Oncology | Admitting: Adult Health

## 2017-02-13 ENCOUNTER — Inpatient Hospital Stay (HOSPITAL_COMMUNITY): Payer: Medicare Other

## 2017-02-13 VITALS — BP 161/84 | HR 88 | Temp 98.0°F | Resp 20 | Ht 62.0 in | Wt 303.4 lb

## 2017-02-13 DIAGNOSIS — R21 Rash and other nonspecific skin eruption: Secondary | ICD-10-CM | POA: Insufficient documentation

## 2017-02-13 DIAGNOSIS — D631 Anemia in chronic kidney disease: Secondary | ICD-10-CM

## 2017-02-13 DIAGNOSIS — N183 Chronic kidney disease, stage 3 unspecified: Secondary | ICD-10-CM

## 2017-02-13 DIAGNOSIS — C9000 Multiple myeloma not having achieved remission: Secondary | ICD-10-CM

## 2017-02-13 DIAGNOSIS — R634 Abnormal weight loss: Secondary | ICD-10-CM | POA: Insufficient documentation

## 2017-02-13 DIAGNOSIS — Z713 Dietary counseling and surveillance: Secondary | ICD-10-CM | POA: Diagnosis present

## 2017-02-13 DIAGNOSIS — Z9484 Stem cells transplant status: Secondary | ICD-10-CM | POA: Insufficient documentation

## 2017-02-13 DIAGNOSIS — I10 Essential (primary) hypertension: Secondary | ICD-10-CM

## 2017-02-13 DIAGNOSIS — Z6841 Body Mass Index (BMI) 40.0 and over, adult: Secondary | ICD-10-CM | POA: Insufficient documentation

## 2017-02-13 DIAGNOSIS — D649 Anemia, unspecified: Secondary | ICD-10-CM | POA: Diagnosis not present

## 2017-02-13 LAB — CBC WITH DIFFERENTIAL/PLATELET
BASOS PCT: 0 %
Basophils Absolute: 0 10*3/uL (ref 0.0–0.1)
Eosinophils Absolute: 0 10*3/uL (ref 0.0–0.7)
Eosinophils Relative: 0 %
HEMATOCRIT: 28.9 % — AB (ref 36.0–46.0)
HEMOGLOBIN: 9.3 g/dL — AB (ref 12.0–15.0)
LYMPHS ABS: 1 10*3/uL (ref 0.7–4.0)
LYMPHS PCT: 23 %
MCH: 28.1 pg (ref 26.0–34.0)
MCHC: 32.2 g/dL (ref 30.0–36.0)
MCV: 87.3 fL (ref 78.0–100.0)
MONO ABS: 0.6 10*3/uL (ref 0.1–1.0)
MONOS PCT: 15 %
NEUTROS ABS: 2.6 10*3/uL (ref 1.7–7.7)
NEUTROS PCT: 62 %
Platelets: 139 10*3/uL — ABNORMAL LOW (ref 150–400)
RBC: 3.31 MIL/uL — ABNORMAL LOW (ref 3.87–5.11)
RDW: 17.3 % — AB (ref 11.5–15.5)
WBC: 4.2 10*3/uL (ref 4.0–10.5)

## 2017-02-13 LAB — COMPREHENSIVE METABOLIC PANEL
ALBUMIN: 4 g/dL (ref 3.5–5.0)
ALK PHOS: 42 U/L (ref 38–126)
ALT: 23 U/L (ref 14–54)
ANION GAP: 12 (ref 5–15)
AST: 32 U/L (ref 15–41)
BUN: 20 mg/dL (ref 6–20)
CALCIUM: 10.2 mg/dL (ref 8.9–10.3)
CO2: 24 mmol/L (ref 22–32)
Chloride: 101 mmol/L (ref 101–111)
Creatinine, Ser: 1.21 mg/dL — ABNORMAL HIGH (ref 0.44–1.00)
GFR calc Af Amer: 55 mL/min — ABNORMAL LOW (ref >60)
GFR, EST NON AFRICAN AMERICAN: 48 mL/min — AB (ref >60)
GLUCOSE: 100 mg/dL — AB (ref 65–99)
POTASSIUM: 4.3 mmol/L (ref 3.5–5.1)
Sodium: 137 mmol/L (ref 135–145)
Total Bilirubin: 0.6 mg/dL (ref 0.3–1.2)
Total Protein: 8.7 g/dL — ABNORMAL HIGH (ref 6.5–8.1)

## 2017-02-13 LAB — MAGNESIUM: MAGNESIUM: 1.4 mg/dL — AB (ref 1.7–2.4)

## 2017-02-13 MED ORDER — HYDROCHLOROTHIAZIDE 25 MG PO TABS: 25.0000 mg | ORAL_TABLET | Freq: Every day | ORAL | 0 refills | Status: DC

## 2017-02-13 MED ORDER — MAGNESIUM OXIDE 400 (241.3 MG) MG PO TABS: 400.0000 mg | ORAL_TABLET | Freq: Two times a day (BID) | ORAL | 0 refills | Status: DC

## 2017-02-13 NOTE — Patient Instructions (Signed)
Goals 1. Follow My Plate 2. Cut out snacks, sweets and processed foods of hot dogs, bologna, sausage and bacon, and fat back.. 3. Drink only water  4. Eat 3 balanced foods at times discussed. 5. Chair exercises 15 minutes daily. 6. Increase fresh fruits and vegetables. Lose 1-2 lbs per week.

## 2017-02-13 NOTE — Progress Notes (Signed)
Medical Nutrition Therapy:  Appt start time: 6195 end time:  1630.   Assessment:  Primary concerns today: Obesity. Referred by Dr. Arther Abbott, Orthopedic. Pt. Needs to lose about 85 lbs to be eligible for knee surgery.LIves by herself. She does her own cooking and shopping. Eats 3 meals per day. Needs to lose down to 220 lbs per Orthopedic for sugery.. Has lost 30 lbs recently by cutting back on portions. PMH: HTN and Stg 3 CKD. Going to see CKD specialist next week in [place]. PCP Dr. Abran Richard.  Pt notes she snores and has never had a sleep study done or evaluated for sleep apnea. Wakes up very tired.    She is willing to make changes with her diet. Diet is high in fat, salt and processed foods contributing to BP and CKD and obesity. Diet is low in fresh fruits and low carb vegetables. Limited activity due to obesity.  Wt Readings from Last 3 Encounters:  02/13/17 (!) 305 lb (138.3 kg)  02/13/17 (!) 303 lb 6.4 oz (137.6 kg)  02/02/17 (!) 313 lb (142 kg)   Ht Readings from Last 3 Encounters:  02/13/17 5\' 2"  (1.575 m)  02/13/17 5\' 2"  (1.575 m)  02/02/17 5\' 2"  (1.575 m)   Body mass index is 55.79 kg/m.  CMP Latest Ref Rng & Units 02/13/2017 12/19/2016 11/21/2016  Glucose 65 - 99 mg/dL 100(H) 103(H) 105(H)  BUN 6 - 20 mg/dL 20 24(H) 21(H)  Creatinine 0.44 - 1.00 mg/dL 1.21(H) 1.29(H) 1.02(H)  Sodium 135 - 145 mmol/L 137 139 139  Potassium 3.5 - 5.1 mmol/L 4.3 4.5 4.1  Chloride 101 - 111 mmol/L 101 102 101  CO2 22 - 32 mmol/L 24 28 27   Calcium 8.9 - 10.3 mg/dL 10.2 10.5(H) 9.7  Total Protein 6.5 - 8.1 g/dL 8.7(H) 8.8(H) 8.7(H)  Total Bilirubin 0.3 - 1.2 mg/dL 0.6 0.9 0.5  Alkaline Phos 38 - 126 U/L 42 44 49  AST 15 - 41 U/L 32 25 23  ALT 14 - 54 U/L 23 15 12(L)     Physical activity; ADL.  Preferred Learning Style:  Auditory  Visual  Hands on  Learning Readiness:  Ready  Change in progress   MEDICATIONS: see list   DIETARY INTAKE:     24-hr  recall:  B ( AM): 2 boiled hot dogs, ketup and mustard, water and soda- 1 a day. + Snk ( AM): 1/2 apple   L ( PM): chicken cheese steak, 9 in. Sweet tea Snk ( PM): fruit, D ( PM): Pinto beans cooked with fat back and corn bread, Fruit punch, water Snk ( PM): 3 mm's.  Beverages: water, sodas, tea,   Usual physical activity: ADL  Estimated energy needs: 1500  calories 170 g carbohydrates 112 g protein 42 g fat  Progress Towards Goal(s):  In progress.   Nutritional Diagnosis:  NB-1.1 Food and nutrition-related knowledge deficit As related to Obesity, HTN and CKD Stg 3  As evidenced by BMI >40, BP > 130/980 and Creatine 1.2 mg/dl.     Intervention:  Nutrition and weigh loss education provided on Low Salt Low Fat High Fiber Diet using My Plate, CHO counting, meal planning, portion sizes, timing of meals, avoiding snacks between meals  taking medications as prescribed, benefits of exercising 30 minutes per day and prevention of Diabetes.  Teaching Method Utilized:  Visual Auditory Hands on  Handouts given during visit include:  The Plate Method   Meal Plan Card  HTN  Nutrition Therapy  Low Salt Seasonings    Barriers to learning/adherence to lifestyle change: none  Demonstrated degree of understanding via:  Teach Back   Monitoring/Evaluation:  Dietary intake, exercise, meal planning, and body weight in 1 month(s).

## 2017-02-13 NOTE — Patient Instructions (Signed)
[place] at [facility] Discharge Instructions  RECOMMENDATIONS MADE BY THE CONSULTANT AND ANY TEST RESULTS WILL BE SENT TO YOUR REFERRING PHYSICIAN.  You were seen today by Mike Craze NP. Zometa next month. Start magnesium twice a day for 2 weeks, Rx sent to your pharmacy. Refilled BP medication for one month, Talk to your family Dr. About better control of your B;ood Pressure. Return in 1 months for labs and follow up.   Thank you for choosing [place] at [facility] to provide your oncology and hematology care.  To afford each patient quality time with our provider, please arrive at least 15 minutes before your scheduled appointment time.    If you have a lab appointment with the [facility] please come in thru the  Main Entrance and check in at the main information desk  You need to re-schedule your appointment should you arrive 10 or more minutes late.  We strive to give you quality time with our providers, and arriving late affects you and other patients whose appointments are after yours.  Also, if you no show three or more times for appointments you may be dismissed from the clinic at the providers discretion.     Again, thank you for choosing [facility].  Our hope is that these requests will decrease the amount of time that you wait before being seen by our physicians.       _____________________________________________________________  Should you have questions after your visit to [facility], please contact our office at [phone] between the hours of 8:30 a.m. and 4:30 p.m.  Voicemails left after 4:30 p.m. will not be returned until the following business day.  For prescription refill requests, have your pharmacy contact our office.       Resources For Cancer Patients and their Caregivers ? American Cancer Society: Can assist with transportation, wigs, general needs, runs Look Good Feel  Better.        434-817-0882 ? Cancer Care: Provides financial assistance, online support groups, medication/co-pay assistance.  1-800-813-HOPE (279)865-1844) ? [employer] Assists [place] cancer patients and their families through emotional , educational and financial support.  409-467-1024 ? [name] Where to apply for food stamps, Medicaid and utility assistance. 531-456-7881 ? [name]: Transportation to medical appointments. 928-509-2147 ? Social Security Administration: May apply for disability if have a Stage IV cancer. 336-582-1572 910 085 9170 ? [name], Disability and Transit Services: Assists with nutrition, care and transit needs. [place] Support Programs: @10RELATIVEDAYS @ > Cancer Support Group  2nd Tuesday of the month 1pm-2pm, Journey Room  > Creative Journey  3rd Tuesday of the month 1130am-1pm, Journey Room  > Look Good Feel Better  1st Wednesday of the month 10am-12 noon, Journey Room (Call [place] to register [phone])

## 2017-02-18 ENCOUNTER — Encounter (HOSPITAL_COMMUNITY): Payer: Self-pay | Admitting: Adult Health

## 2017-03-11 ENCOUNTER — Other Ambulatory Visit (HOSPITAL_COMMUNITY): Payer: Self-pay | Admitting: Adult Health

## 2017-03-11 DIAGNOSIS — C9000 Multiple myeloma not having achieved remission: Secondary | ICD-10-CM

## 2017-03-12 ENCOUNTER — Other Ambulatory Visit (HOSPITAL_COMMUNITY): Payer: Self-pay | Admitting: *Deleted

## 2017-03-12 DIAGNOSIS — E876 Hypokalemia: Secondary | ICD-10-CM

## 2017-03-13 ENCOUNTER — Inpatient Hospital Stay (HOSPITAL_COMMUNITY): Payer: Medicare Other | Admitting: Dietician

## 2017-03-13 ENCOUNTER — Ambulatory Visit (HOSPITAL_COMMUNITY): Payer: Medicare Other

## 2017-03-13 ENCOUNTER — Encounter (HOSPITAL_COMMUNITY): Payer: Self-pay | Admitting: Oncology

## 2017-03-13 ENCOUNTER — Inpatient Hospital Stay (HOSPITAL_COMMUNITY): Payer: Medicare Other

## 2017-03-13 ENCOUNTER — Inpatient Hospital Stay (HOSPITAL_COMMUNITY): Payer: Medicare Other | Attending: Oncology

## 2017-03-13 ENCOUNTER — Ambulatory Visit (HOSPITAL_COMMUNITY): Payer: Medicare Other | Admitting: Internal Medicine

## 2017-03-13 ENCOUNTER — Other Ambulatory Visit (HOSPITAL_COMMUNITY): Payer: Medicare Other

## 2017-03-13 ENCOUNTER — Inpatient Hospital Stay (HOSPITAL_BASED_OUTPATIENT_CLINIC_OR_DEPARTMENT_OTHER): Payer: Medicare Other | Admitting: Oncology

## 2017-03-13 ENCOUNTER — Other Ambulatory Visit: Payer: Self-pay

## 2017-03-13 VITALS — BP 111/70 | HR 80 | Temp 98.7°F | Resp 20

## 2017-03-13 DIAGNOSIS — C9000 Multiple myeloma not having achieved remission: Secondary | ICD-10-CM

## 2017-03-13 DIAGNOSIS — Z809 Family history of malignant neoplasm, unspecified: Secondary | ICD-10-CM | POA: Insufficient documentation

## 2017-03-13 DIAGNOSIS — C9001 Multiple myeloma in remission: Secondary | ICD-10-CM

## 2017-03-13 DIAGNOSIS — R634 Abnormal weight loss: Secondary | ICD-10-CM | POA: Diagnosis not present

## 2017-03-13 DIAGNOSIS — D649 Anemia, unspecified: Secondary | ICD-10-CM

## 2017-03-13 DIAGNOSIS — Z79899 Other long term (current) drug therapy: Secondary | ICD-10-CM | POA: Insufficient documentation

## 2017-03-13 DIAGNOSIS — E876 Hypokalemia: Secondary | ICD-10-CM

## 2017-03-13 LAB — CBC WITH DIFFERENTIAL/PLATELET
BASOS PCT: 0 %
Basophils Absolute: 0 10*3/uL (ref 0.0–0.1)
EOS ABS: 0 10*3/uL (ref 0.0–0.7)
Eosinophils Relative: 1 %
HEMATOCRIT: 28.3 % — AB (ref 36.0–46.0)
HEMOGLOBIN: 9.1 g/dL — AB (ref 12.0–15.0)
LYMPHS ABS: 0.9 10*3/uL (ref 0.7–4.0)
Lymphocytes Relative: 22 %
MCH: 28.4 pg (ref 26.0–34.0)
MCHC: 32.2 g/dL (ref 30.0–36.0)
MCV: 88.4 fL (ref 78.0–100.0)
MONOS PCT: 7 %
Monocytes Absolute: 0.3 10*3/uL (ref 0.1–1.0)
NEUTROS ABS: 2.8 10*3/uL (ref 1.7–7.7)
NEUTROS PCT: 70 %
Platelets: 177 10*3/uL (ref 150–400)
RBC: 3.2 MIL/uL — AB (ref 3.87–5.11)
RDW: 17.9 % — ABNORMAL HIGH (ref 11.5–15.5)
WBC: 4 10*3/uL (ref 4.0–10.5)

## 2017-03-13 LAB — COMPREHENSIVE METABOLIC PANEL
ALK PHOS: 39 U/L (ref 38–126)
ALT: 11 U/L — AB (ref 14–54)
ANION GAP: 7 (ref 5–15)
AST: 22 U/L (ref 15–41)
Albumin: 4.1 g/dL (ref 3.5–5.0)
BILIRUBIN TOTAL: 0.7 mg/dL (ref 0.3–1.2)
BUN: 23 mg/dL — AB (ref 6–20)
CALCIUM: 10.3 mg/dL (ref 8.9–10.3)
CO2: 27 mmol/L (ref 22–32)
CREATININE: 1.18 mg/dL — AB (ref 0.44–1.00)
Chloride: 105 mmol/L (ref 101–111)
GFR calc Af Amer: 57 mL/min — ABNORMAL LOW (ref >60)
GFR calc non Af Amer: 49 mL/min — ABNORMAL LOW (ref >60)
GLUCOSE: 100 mg/dL — AB (ref 65–99)
Potassium: 4.1 mmol/L (ref 3.5–5.1)
SODIUM: 139 mmol/L (ref 135–145)
TOTAL PROTEIN: 8.3 g/dL — AB (ref 6.5–8.1)

## 2017-03-13 LAB — VITAMIN B12: Vitamin B-12: 452 pg/mL (ref 180–914)

## 2017-03-13 LAB — MAGNESIUM: Magnesium: 1.6 mg/dL — ABNORMAL LOW (ref 1.7–2.4)

## 2017-03-13 LAB — IRON AND TIBC
Iron: 64 ug/dL (ref 28–170)
Saturation Ratios: 24 % (ref 10.4–31.8)
TIBC: 265 ug/dL (ref 250–450)
UIBC: 201 ug/dL

## 2017-03-13 LAB — FOLATE: FOLATE: 36.6 ng/mL (ref >5.9)

## 2017-03-13 LAB — FERRITIN: Ferritin: 520 ng/mL — ABNORMAL HIGH (ref 11–307)

## 2017-03-13 MED ORDER — MAGNESIUM OXIDE 400 (241.3 MG) MG PO TABS: 400.0000 mg | ORAL_TABLET | Freq: Two times a day (BID) | ORAL | 0 refills | Status: DC

## 2017-03-13 MED ORDER — SODIUM CHLORIDE 0.9% FLUSH
10.0000 mL | Freq: Once | INTRAVENOUS | Status: AC
  Administered 2017-03-13: 10 mL via INTRAVENOUS

## 2017-03-13 MED ORDER — SODIUM CHLORIDE 0.9 % IV SOLN
INTRAVENOUS | Status: DC
  Administered 2017-03-13: 11:00:00 via INTRAVENOUS

## 2017-03-13 MED ORDER — POTASSIUM CHLORIDE CRYS ER 20 MEQ PO TBCR: 20.0000 meq | EXTENDED_RELEASE_TABLET | Freq: Two times a day (BID) | ORAL | 2 refills | Status: DC

## 2017-03-13 MED ORDER — ZOLEDRONIC ACID 4 MG/100ML IV SOLN
4.0000 mg | Freq: Once | INTRAVENOUS | Status: AC
  Administered 2017-03-13: 4 mg via INTRAVENOUS
  Filled 2017-03-13: qty 100

## 2017-03-13 NOTE — Patient Instructions (Signed)
[facility] at [facility]  Discharge Instructions:  You received zometa today.  _______________________________________________________________  Thank you for choosing [facility] at [facility] to provide your oncology and hematology care.  To afford each patient quality time with our providers, please arrive at least 15 minutes before your scheduled appointment.  You need to re-schedule your appointment if you arrive 10 or more minutes late.  We strive to give you quality time with our providers, and arriving late affects you and other patients whose appointments are after yours.  Also, if you no show three or more times for appointments you may be dismissed from the clinic.  Again, thank you for choosing [facility] at [facility]. Our hope is that these requests will allow you access to exceptional care and in a timely manner. _______________________________________________________________  If you have questions after your visit, please contact our office at [phone] between the hours of 8:30 a.m. and 5:00 p.m. Voicemails left after 4:30 p.m. will not be returned until the following business day. _______________________________________________________________  For prescription refill requests, have your pharmacy contact our office. _______________________________________________________________  Recommendations made by the consultant and any test results will be sent to your referring physician. _______________________________________________________________ 

## 2017-03-13 NOTE — Progress Notes (Signed)
Patient tolerated zometa infusion with no complaints voiced.  Peripheral IV site clean and dry with no bruising or swelling noted at site.  Band aid applied.  VSs with discharge and left ambulatory with no s/s of distress noted.

## 2017-03-13 NOTE — Progress Notes (Addendum)
Kelli Hurley, Kelli Hurley [id number]   CLINIC:  Medical Oncology/Hematology  PCP:  Kelli Richard, MD [address] [place] [address] [phone]   REASON FOR VISIT:  Follow-up for h/o Multiple Myeloma s/p stem cell transplant on 05/07/15  CURRENT THERAPY: Maintenance Ixaxomib (Ninlaro) 3 mg po on Days 1, 8, & 15 beginning 04/17/16 & Xgeva inj monthly   BRIEF ONCOLOGIC HISTORY:    Multiple myeloma ([place])   08/07/2014 Imaging    Bone Survey- No lytic lesions are noted in the visualized skeleton.      09/03/2014 Bone Marrow Biopsy    NORMOCELLULAR BONE MARROW FOR AGE WITH PLASMA CELL NEOPLASM.  The plasma cell component is increased in the marrow representing an estimated 18% of all cells. Cytogenetics with 13q-, 17p- (high risk disease)      09/03/2014 Pathology Results    Cytogenetics with 13q-, 17p- (high risk disease)      09/23/2014 Initial Diagnosis    Multiple myeloma      09/30/2014 PET scan    No abnormal hypermetabolism in the neck, chest, abdomen or pelvis.      10/05/2014 - 03/22/2015 Chemotherapy    RVD      10/28/2014 Treatment Plan Change    Issues related to getting Revlimid in a timely fashion, therefore, she received her Revlimid on 9/21 resulting in a 12 day cycle this time instead of a 14 day cycle      11/02/2014 Imaging    CTA chest- No evidence for a large or central pulmonary embolism as described.  8 mm density along the right minor fissure could represent focal pleural thickening but indeterminate. If the patient is at high risk for bronchogenic carcinoma, follow-up c      11/23/2014 Miscellaneous    Zometa 4 mg IV monthly      04/07/2015 Bone Marrow Biopsy    Normocellular marrow with 2-4% clonal plasma cells by immunohistochemistry. FISH and cytogenetics were normal [facility])        04/2015 Miscellaneous    PRETRANSPLANT EVALUATION:  Pulmonary function tests: FEV1 100.3% / DLCO 97.9%  Echocardiogram:  Normal LV function with EF 60-65%       04/27/2015 Procedure    Stem cell mobilization with filgrastim and Mozobil [facility])      05/06/2015 Miscellaneous    BMT conditioning regimen with high-dose Melphalan given [facility] [name]); Day -1      05/07/2015 Bone Marrow Transplant    Outpatient autologous stem cell transplant [facility] [name]); Day 0      05/18/2015 Miscellaneous    WBC engraftment;  did not require platelet transfusion during her transplant process. Lowest platelet count 28,000       05/20/2015 Procedure    Tunneled catheter removed [facility])      09/08/2015 -  Chemotherapy    Velcade every 2 weeks      12/15/2015 Miscellaneous    Zometa re-instituted.       12/23/2015 Imaging    Bone density- BMD as determined from Forearm Radius 33% is 0.799 g/cm2 with a T-Score of 1.2. This patient is considered normal according to [facility]) criteria.      04/17/2016 Miscellaneous    Started maintenance with Ninlaro.      05/04/2016 Treatment Plan Change    Zometa switched to Xgeva injection monthly given difficult IV access.         INTERVAL HISTORY:  Kelli Hurley 62 y.o. female  returns for routine follow-up for multiple myeloma s/p autologous stem cell transplant.   Patient was seen by Kelli Rings, NP at the beginning of January 2019. She had recently been seen by [facility] for rash in her face and upper chest. She was taken off both Ninlaro and Diclofenac with concerns of exacerbating the rash. There was also concern about her energy level and her appetite over the past few weeks. She had lost approximately 30 pounds in the past 3 months unintentionally. She had been feeling more weak as a result of the weight loss. She had not tried any nutritional supplements because she did not have a taste for them.  Today patient presents feeling "much better".. Her appetite is 100% energy levels are 75%. She  continues to have bilateral knee pain. She has not resumed her Diclofenac. She denies any other symptoms. She denies chest pain, shortness of breath, constipation, diarrhea or urinary complaints.  She is here for every 3 month Zometa and lab results. She also needs a refill of her potassium and blood pressure medicine.  REVIEW OF SYSTEMS:  Review of Systems  Constitutional: Positive for fatigue. Negative for appetite change, chills, fever and unexpected weight change.  HENT:  Negative.   Eyes: Negative.   Respiratory: Negative.   Cardiovascular: Negative for leg swelling.  Gastrointestinal: Negative.   Endocrine: Negative.   Genitourinary: Negative.    Musculoskeletal: Positive for arthralgias (Knee pain). Negative for gait problem.  Skin: Positive for rash. Negative for itching.       Rash Cleared   Neurological: Negative.  Negative for dizziness, gait problem, headaches and numbness.  Hematological: Negative.   Psychiatric/Behavioral: Negative.  Negative for confusion.     PAST MEDICAL/SURGICAL HISTORY:  Past Medical History:  Diagnosis Date  . Anemia   . Arthritis   . Cancer ([place])   . Claustrophobia 10/05/2014  . Hypertension   . Leukopenia 08/03/2014  . Normocytic hypochromic anemia 08/03/2014  . Renal disorder    stage 3    Past Surgical History:  Procedure Laterality Date  . ABDOMINAL HYSTERECTOMY    . CARDIAC SURGERY    . OTHER SURGICAL HISTORY     heart surgery as infant to "repair hole in heart"     SOCIAL HISTORY:  Social History   Socioeconomic History  . Marital status: Legally Separated    Spouse name: Not on file  . Number of children: Not on file  . Years of education: Not on file  . Highest education level: Not on file  Social Needs  . Financial resource strain: Not on file  . Food insecurity - worry: Not on file  . Food insecurity - inability: Not on file  . Transportation needs - medical: Not on file  . Transportation needs - non-medical: Not  on file  Occupational History  . Not on file  Tobacco Use  . Smoking status: Never Smoker  . Smokeless tobacco: Never Used  Substance and Sexual Activity  . Alcohol use: No  . Drug use: No  . Sexual activity: Not on file    Comment: divorced- 2 daughters  Other Topics Concern  . Not on file  Social History Narrative  . Not on file    FAMILY HISTORY:  Family History  Problem Relation Age of Onset  . Cancer Mother   . Hypertension Mother   . Cancer Father   . Hypertension Father   . Cancer Maternal Grandmother   .  Hypertension Sister   . Hypertension Brother     CURRENT MEDICATIONS:  Outpatient Encounter Medications as of 03/13/2017  Medication Sig  . acyclovir (ZOVIRAX) 800 MG tablet TAKE 1 TABLET BY MOUTH TWICE DAILY  . cholecalciferol (VITAMIN D) 1000 units tablet Take 1,000 Units by mouth daily.  . hydrochlorothiazide (HYDRODIURIL) 25 MG tablet TAKE 1 TABLET BY MOUTH ONCE DAILY.  . hydrocortisone 2.5 % ointment   . ixazomib citrate (NINLARO) 3 MG capsule Take 1 capsule (88m) by mouth once a week on days 1, 8 & 15 every 28 day cycle. Take on empty stomach 1 hr before or 2 hrs after food  . Multiple Vitamin (TAB-A-VITE) TABS TAKE 1 TABLET BY MOUTH ONCE DAILY.  .Marland Kitchenpotassium chloride SA (K-DUR,KLOR-CON) 20 MEQ tablet Take 1 tablet (20 mEq total) by mouth 2 (two) times daily.  .Marland Kitchenzolendronic acid (ZOMETA) 4 MG/5ML injection Inject 4 mg into the vein every 28 (twenty-eight) days.  . magnesium oxide (MAG-OX) 400 (241.3 Mg) MG tablet Take 1 tablet (400 mg total) by mouth 2 (two) times daily.  . [DISCONTINUED] diclofenac (VOLTAREN) 50 MG EC tablet Take 1 tablet (50 mg total) by mouth 2 (two) times daily. (Patient not taking: Reported on 02/13/2017)  . [DISCONTINUED] magnesium oxide (MAG-OX) 400 (241.3 Mg) MG tablet Take 1 tablet (400 mg total) by mouth 2 (two) times daily.  . [DISCONTINUED] PROAIR HFA 108 (90 Base) MCG/ACT inhaler Inhale 2 puffs into the lungs daily as needed.     Facility-Administered Encounter Medications as of 03/13/2017  Medication  . heparin lock flush 100 unit/mL  . sodium chloride 0.9 % injection 10 mL    ALLERGIES:  No Known Allergies   PHYSICAL EXAM:  ECOG Performance status: 1 - Symptomatic; remains largely independent   Physical Exam  Constitutional: She is oriented to person, place, and time and well-developed, well-nourished, and in no distress.  Exam done with patient seated in chair  HENT:  Head: Normocephalic.  Mouth/Throat: Oropharynx is clear and moist. No oropharyngeal exudate.  (L) eyelid mildly swollen  Eyes: Conjunctivae are normal. Pupils are equal, round, and reactive to light. No scleral icterus.  Neck: Normal range of motion. Neck supple.  Cardiovascular: Normal rate and regular rhythm.  Pulmonary/Chest: Effort normal and breath sounds normal. No respiratory distress.  Abdominal: Soft. Bowel sounds are normal. There is no tenderness.  Musculoskeletal: Normal range of motion. She exhibits no edema (1+ BLE edema).  Lymphadenopathy:    She has no cervical adenopathy.  Neurological: She is alert and oriented to person, place, and time. No cranial nerve deficit.  Skin: Skin is warm and dry. No rash noted.  Rash from previous visit has completely resolved.  Psychiatric: Mood, memory, affect and judgment normal.  Nursing note and vitals reviewed.    LABORATORY DATA:  I have reviewed the labs as listed.  CBC    Component Value Date/Time   WBC 4.0 03/13/2017 0936   RBC 3.20 (L) 03/13/2017 0936   HGB 9.1 (L) 03/13/2017 0936   HCT 28.3 (L) 03/13/2017 0936   PLT 177 03/13/2017 0936   MCV 88.4 03/13/2017 0936   MCH 28.4 03/13/2017 0936   MCHC 32.2 03/13/2017 0936   RDW 17.9 (H) 03/13/2017 0936   LYMPHSABS 0.9 03/13/2017 0936   MONOABS 0.3 03/13/2017 0936   EOSABS 0.0 03/13/2017 0936   BASOSABS 0.0 03/13/2017 0936   CMP Latest Ref Rng & Units 03/13/2017 02/13/2017 12/19/2016  Glucose 65 - 99 mg/dL 100(H) 100(H)  103(H)  BUN 6 - 20 mg/dL 23(H) 20 24(H)  Creatinine 0.44 - 1.00 mg/dL 1.18(H) 1.21(H) 1.29(H)  Sodium 135 - 145 mmol/L 139 137 139  Potassium 3.5 - 5.1 mmol/L 4.1 4.3 4.5  Chloride 101 - 111 mmol/L 105 101 102  CO2 22 - 32 mmol/L _0 Calcium 8.9 - 10.3 mg/dL 10.3 10.2 10.5(H)  Total Protein 6.5 - 8.1 g/dL 8.3(H) 8.7(H) 8.8(H)  Total Bilirubin 0.3 - 1.2 mg/dL 0.7 0.6 0.9  Alkaline Phos 38 - 126 U/L 39 42 44  AST 15 - 41 U/L 22 32 25  ALT 14 - 54 U/L 11(L) 23 15     PENDING LABS:  Myeloma labs pending for today     DIAGNOSTIC IMAGING:  DEXA scan: 12/23/15   ASSESSMENT & PLAN:  Ms. Sirek is a very pleasant 62 y.o. female with history of Multiple Myeloma with high-risk features (13q-, 17p-); treated with RVD therapy, then stem cell transplant at [facility] on 05/07/15.  She was on maintenance Velcade every 2 weeks, which was restarted after transplant on 09/08/15, with plans to continue to a total of 2 years. She was also receiving Zometa monthly. In early 04/2016, trending increase in kappa light chain values with slow increase in M-spike as well. Discussed with Dr. Norma Fredrickson at [facility] and he recommended increasing frequency of Velcade scheduling vs try to get Ixaxomib [facility]) approved by insurance, if able, in lieu of Velcade.  Ninlaro approved by insurance and started on 04/17/16.    Multiple myeloma s/p stem cell transplant:  - Patient continues Ninlaro 3 mg on days 1, 8 and 15 every 28 days. This was started on 04/17/2016. -Facial rash has completely resolved. This appears to be unrelated to medications. Since then she has resumed Ninlaro without complications -She continues her steroid cream as prescribed PRN. -Myeloma labs are pending. Most recent M spike was 0.6% from 11 2018. Most recent recommendations by Dr. Norma Fredrickson does not recommend a change in therapy until her M spike is 0.8 or greater. Will call patient with results from today. - RTC in 3 months  for follow-up with labs and Zometa infusion.  Addendum: Patient's iron panel reveals a ferritin of  520 and no evidence of iron deficiency.  Hemoglobin continues to trend down and is currently 9.1.  Patient was not symptomatic at time of evaluation.  She may need a reduction in Ninlaro if this continues. we will let MD make that decision at next visit when labs are drawn.  IgG and IgM remain elevated but stable.  M spike 0.5% which is stable from previous 0.6%.  No changes will be made at this time based on Dr. Norma Fredrickson note.  Rash: Resolved - She has resumed Ninlaro as prescribed. - She continues to hold her Diclofenac for now. She does admit that this medication helped with her joint pain. She has an appointment with orthopedics in the next few weeks.  Anemia: - She continues to have progressive anemia. Hemoglobin today is 9.1 g/dL. She denies any frank bleeding episodes. This is likely disease/treatment related. Ferritin and iron panel were added to labs today. We'll wait for these results to rule out IDA. May need dose reduction of Ninlaro if this continues.  Weight loss:  -Current weight 298 lbs.  -Patient states this is intentional weight loss. Her appetite has increased and she is eating better and only drinking water. She is scheduled to meet with the dietitian today for healthy eating  choices. -She is not interested in nutritional supplements like boost or Ensure.  Hypomagnesemia:  - Magnesium better today. 1.6. -Continue mag oxide 400 mg twice a day for another 2 weeks. Prescription sent in.  She has tolerated this well on the past. We will follow-up with electrolytes monthly.  Bone health:  -Continue every 3 month Zometa. She will receive Zometa today. -Needs repeat DEXA imaging in November 2019; will schedule this at subsequent visits.  Dispo:   -RTC monthly for labs (CBC, myeloma labs, CMET). -Return to cancer center in ~3 month for follow-up with labs and next Zometa  infusion.   All questions were answered to patient's stated satisfaction. Encouraged patient to call with any new concerns or questions before her next visit to the cancer center and we can certain see her sooner, if needed.    Greater than 50% was spent in counseling and coordination of care with this patient including but not limited to discussion of the relevant topics above (See A&P) including, but not limited to diagnosis and management of acute and chronic medical conditions.   Orders placed this encounter:  No orders of the defined types were placed in this encounter.  Faythe Casa, NP 03/13/2017 11:10 AM

## 2017-03-13 NOTE — Progress Notes (Signed)
Nutrition Brief Note  Per last oncology note, RD consulted due to unintentional weight loss of 30 lbs x 3 months with poor appetite and taste changes.   However, today, patient's report starkly contrasts this. She says she has been attempting to lose weight for a while in an effort to become a candidate for hip replacement. In fact, [facility] to receive wt loss education.   She had already had lost a substantial amount of weight by then. She says she did this by eating smaller portions, cutting out soda/sweets and increasing vegetable intake.   She denies any fatigue/lethargy and says her energy is "much" better.   She has no complaints at all at this time. She feels good.  No nutritional interventions indicated. RD commended her on her healthy lifestyle changes and encouraged her to continue.   Burtis Junes RD, LDN, CNSC Clinical Nutrition Pager: [phone] 03/13/2017 12:22 PM

## 2017-03-14 ENCOUNTER — Ambulatory Visit: Payer: Medicare Other | Admitting: Nutrition

## 2017-03-14 LAB — PROTEIN ELECTROPHORESIS, SERUM
A/G RATIO SPE: 0.9 (ref 0.7–1.7)
ALPHA-2-GLOBULIN: 0.8 g/dL (ref 0.4–1.0)
Albumin ELP: 3.7 g/dL (ref 2.9–4.4)
Alpha-1-Globulin: 0.2 g/dL (ref 0.0–0.4)
Beta Globulin: 1 g/dL (ref 0.7–1.3)
GLOBULIN, TOTAL: 4.3 g/dL — AB (ref 2.2–3.9)
Gamma Globulin: 2.3 g/dL — ABNORMAL HIGH (ref 0.4–1.8)
M-Spike, %: 0.5 g/dL — ABNORMAL HIGH
Total Protein ELP: 8 g/dL (ref 6.0–8.5)

## 2017-03-14 LAB — IGG, IGA, IGM
IgA: 130 mg/dL (ref 87–352)
IgG (Immunoglobin G), Serum: 2005 mg/dL — ABNORMAL HIGH (ref 700–1600)
IgM (Immunoglobulin M), Srm: 294 mg/dL — ABNORMAL HIGH (ref 26–217)

## 2017-03-14 LAB — KAPPA/LAMBDA LIGHT CHAINS
KAPPA, LAMDA LIGHT CHAIN RATIO: 2.02 — AB (ref 0.26–1.65)
Kappa free light chain: 43.9 mg/L — ABNORMAL HIGH (ref 3.3–19.4)
LAMDA FREE LIGHT CHAINS: 21.7 mg/L (ref 5.7–26.3)

## 2017-03-14 LAB — IMMUNOFIXATION ELECTROPHORESIS
IGA: 131 mg/dL (ref 87–352)
IGG (IMMUNOGLOBIN G), SERUM: 2032 mg/dL — AB (ref 700–1600)
IgM (Immunoglobulin M), Srm: 291 mg/dL — ABNORMAL HIGH (ref 26–217)
Total Protein ELP: 7.8 g/dL (ref 6.0–8.5)

## 2017-03-15 ENCOUNTER — Telehealth (HOSPITAL_COMMUNITY): Payer: Self-pay | Admitting: *Deleted

## 2017-03-15 NOTE — Telephone Encounter (Signed)
Left detailed message on patient's voicemail regarding lab. Advised to return call to clinic with any questions.

## 2017-03-15 NOTE — Telephone Encounter (Signed)
-----   Message from Jacquelin Hawking, NP sent at 03/15/2017 11:43 AM EST ----- Can we let this patient know that her labs remained stable.  M spike is 0.5%.  Last M spike was 0.6%.  At this time we are continuing to monitor her labs and will make no changes.  Continue with scheduled appointment. Thanks Sonia Baller

## 2017-04-03 ENCOUNTER — Other Ambulatory Visit (HOSPITAL_COMMUNITY): Payer: Self-pay | Admitting: Oncology

## 2017-04-03 DIAGNOSIS — C9000 Multiple myeloma not having achieved remission: Secondary | ICD-10-CM

## 2017-04-08 ENCOUNTER — Other Ambulatory Visit (HOSPITAL_COMMUNITY): Payer: Self-pay | Admitting: Oncology

## 2017-04-08 DIAGNOSIS — Z79899 Other long term (current) drug therapy: Secondary | ICD-10-CM

## 2017-04-08 DIAGNOSIS — C9000 Multiple myeloma not having achieved remission: Secondary | ICD-10-CM

## 2017-04-13 ENCOUNTER — Encounter (HOSPITAL_COMMUNITY): Payer: Self-pay

## 2017-04-13 NOTE — Progress Notes (Signed)
Received MD consult form from patients dentist office, [place], to clear patient for dental extractions under [place]. Reviewed with provider, Mike Craze, NP. Her recommendations were to wait 6-8 weeks after last Zometa infusion, on  03/13/17, before having extractions. Also she recommended checking patients CBC with diff before extractions due to her maintenance chemotherapy. She recommended planning her extractions on her "off week" of treatment. Requested they call the cancer center with any further questions.

## 2017-04-26 ENCOUNTER — Other Ambulatory Visit (HOSPITAL_COMMUNITY): Payer: Self-pay | Admitting: Emergency Medicine

## 2017-04-26 DIAGNOSIS — C9000 Multiple myeloma not having achieved remission: Secondary | ICD-10-CM

## 2017-04-26 MED ORDER — IXAZOMIB CITRATE 3 MG PO CAPS: ORAL_CAPSULE | ORAL | 4 refills | Status: DC

## 2017-04-26 NOTE — Progress Notes (Signed)
ninlaro refilled

## 2017-05-07 ENCOUNTER — Other Ambulatory Visit (HOSPITAL_COMMUNITY): Payer: Self-pay | Admitting: Adult Health

## 2017-05-07 DIAGNOSIS — C9000 Multiple myeloma not having achieved remission: Secondary | ICD-10-CM

## 2017-05-22 ENCOUNTER — Other Ambulatory Visit (HOSPITAL_COMMUNITY): Payer: Self-pay | Admitting: Adult Health

## 2017-05-22 DIAGNOSIS — Z79899 Other long term (current) drug therapy: Secondary | ICD-10-CM

## 2017-05-22 DIAGNOSIS — C9000 Multiple myeloma not having achieved remission: Secondary | ICD-10-CM

## 2017-06-05 ENCOUNTER — Encounter (HOSPITAL_COMMUNITY): Payer: Self-pay

## 2017-06-05 ENCOUNTER — Inpatient Hospital Stay (HOSPITAL_BASED_OUTPATIENT_CLINIC_OR_DEPARTMENT_OTHER): Payer: Medicare Other | Admitting: Internal Medicine

## 2017-06-05 ENCOUNTER — Other Ambulatory Visit: Payer: Self-pay

## 2017-06-05 ENCOUNTER — Inpatient Hospital Stay (HOSPITAL_COMMUNITY): Payer: Medicare Other

## 2017-06-05 ENCOUNTER — Encounter (HOSPITAL_COMMUNITY): Payer: Self-pay | Admitting: Internal Medicine

## 2017-06-05 ENCOUNTER — Inpatient Hospital Stay (HOSPITAL_COMMUNITY): Payer: Medicare Other | Attending: Hematology

## 2017-06-05 VITALS — BP 142/89 | HR 78 | Temp 98.7°F | Resp 16 | Wt 287.4 lb

## 2017-06-05 VITALS — BP 111/58 | HR 73 | Temp 97.9°F | Resp 18

## 2017-06-05 DIAGNOSIS — C9000 Multiple myeloma not having achieved remission: Secondary | ICD-10-CM

## 2017-06-05 DIAGNOSIS — R7989 Other specified abnormal findings of blood chemistry: Secondary | ICD-10-CM | POA: Diagnosis not present

## 2017-06-05 DIAGNOSIS — C9001 Multiple myeloma in remission: Secondary | ICD-10-CM

## 2017-06-05 DIAGNOSIS — Z9484 Stem cells transplant status: Secondary | ICD-10-CM | POA: Insufficient documentation

## 2017-06-05 LAB — CBC WITH DIFFERENTIAL/PLATELET
Basophils Absolute: 0 10*3/uL (ref 0.0–0.1)
Basophils Relative: 0 %
Eosinophils Absolute: 0.1 10*3/uL (ref 0.0–0.7)
Eosinophils Relative: 1 %
HEMATOCRIT: 33 % — AB (ref 36.0–46.0)
HEMOGLOBIN: 10.6 g/dL — AB (ref 12.0–15.0)
LYMPHS ABS: 1.3 10*3/uL (ref 0.7–4.0)
LYMPHS PCT: 22 %
MCH: 29 pg (ref 26.0–34.0)
MCHC: 32.1 g/dL (ref 30.0–36.0)
MCV: 90.2 fL (ref 78.0–100.0)
MONOS PCT: 6 %
Monocytes Absolute: 0.3 10*3/uL (ref 0.1–1.0)
NEUTROS PCT: 71 %
Neutro Abs: 4.1 10*3/uL (ref 1.7–7.7)
Platelets: 219 10*3/uL (ref 150–400)
RBC: 3.66 MIL/uL — ABNORMAL LOW (ref 3.87–5.11)
RDW: 15.2 % (ref 11.5–15.5)
WBC: 5.8 10*3/uL (ref 4.0–10.5)

## 2017-06-05 LAB — COMPREHENSIVE METABOLIC PANEL
ALBUMIN: 4.2 g/dL (ref 3.5–5.0)
ALK PHOS: 132 U/L — AB (ref 38–126)
ALT: 165 U/L — ABNORMAL HIGH (ref 14–54)
ANION GAP: 11 (ref 5–15)
AST: 224 U/L — ABNORMAL HIGH (ref 15–41)
BILIRUBIN TOTAL: 0.7 mg/dL (ref 0.3–1.2)
BUN: 26 mg/dL — ABNORMAL HIGH (ref 6–20)
CALCIUM: 10.2 mg/dL (ref 8.9–10.3)
CO2: 26 mmol/L (ref 22–32)
Chloride: 103 mmol/L (ref 101–111)
Creatinine, Ser: 1.11 mg/dL — ABNORMAL HIGH (ref 0.44–1.00)
GFR calc Af Amer: >60 mL/min (ref >60)
GFR calc non Af Amer: 53 mL/min — ABNORMAL LOW (ref >60)
GLUCOSE: 98 mg/dL (ref 65–99)
Potassium: 4 mmol/L (ref 3.5–5.1)
Sodium: 140 mmol/L (ref 135–145)
TOTAL PROTEIN: 8.3 g/dL — AB (ref 6.5–8.1)

## 2017-06-05 LAB — MAGNESIUM: Magnesium: 1.9 mg/dL (ref 1.7–2.4)

## 2017-06-05 LAB — LACTATE DEHYDROGENASE: LDH: 375 U/L — ABNORMAL HIGH (ref 98–192)

## 2017-06-05 MED ORDER — SODIUM CHLORIDE 0.9 % IV SOLN
INTRAVENOUS | Status: DC
  Administered 2017-06-05: 11:00:00 via INTRAVENOUS

## 2017-06-05 MED ORDER — ZOLEDRONIC ACID 4 MG/100ML IV SOLN
4.0000 mg | Freq: Once | INTRAVENOUS | Status: AC
  Administered 2017-06-05: 4 mg via INTRAVENOUS
  Filled 2017-06-05: qty 100

## 2017-06-05 NOTE — Progress Notes (Signed)
1030 Labs reviewed with and pt seen by Dr. Walden Field today and pt approved for Zometa infusion Calcium 10.2 today and pt denied any tooth or jaw pain and no recent or future dental visits.                                          Kelli Hurley tolerated Zometa infusion well without complaints or incident. VSS upon discharge, Pt discharged self ambulatory in satisfactory condition

## 2017-06-05 NOTE — Progress Notes (Signed)
Diagnosis Multiple myeloma in remission ([place]) - Plan: Comprehensive metabolic panel, CBC with Differential/Platelet, Comprehensive metabolic panel, Lactate dehydrogenase, Protein electrophoresis, serum, Kappa/lambda light chains, Beta 2 microglobulin, serum, IgG, IgA, IgM, Hepatitis C RNA quantitative, Hepatitis B surface antibody, Hepatitis B surface antigen, Hepatitis B core antibody, total, Hepatitis panel, acute  Staging Cancer Staging Multiple myeloma (HCC) Staging form: Multiple Myeloma, AJCC 6th Edition - Clinical stage from 10/26/2014: Stage IIA - Signed by Baird Cancer, PA-C on 10/26/2014 - Pathologic: No stage assigned - Unsigned  CURRENT THERAPY: Maintenance Ixaxomib (Ninlaro) 3 mg po on Days 1, 8, & 15 beginning 04/17/16 & Zometa every 3 months  Assessment and Plan:  74.  MM.  62 year old female with IgG kappa myeloma with high risk features -17 p and  -13 q..  She was stage II by R- ISS and achieved a PR prior to transplant.  Pt is s/p stem cell transplant on 05/07/15.  She will continue Maintenance Ixaxomib (Ninlaro) 3 mg po on Days 1, 8, & 15.  Definition of progression would mean M spike greater than 0.74.  She will return to clinic in 1 month for follow-up and repeat myeloma labs at that time.  She will continue Zometa every 3 months for 2 years.  2.  Prophylaxis.  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    3.  Elevated liver function tests.  Review of labs from office visit at [facility] on 05/23/2017 showed normal liver function tests.  AST is 224 ALT 165.  Bilirubin is within normal limits.  Will repeat liver function tests in 1 week and she will hold Ninlaro this week.  She denies any abdominal symptoms.  Will check hepatitis labs in 1 week with repeat chemistries.  Interval History:  Multiple Myeloma s/p stem cell transplant on 05/07/15  Current Status:  Pt is seen today for follow-up.  She is here for evaluation prior  to Zometa.  She has recently been seen at [facility].      Multiple myeloma ([place])   08/07/2014 Imaging    Bone Survey- No lytic lesions are noted in the visualized skeleton.      09/03/2014 Bone Marrow Biopsy    NORMOCELLULAR BONE MARROW FOR AGE WITH PLASMA CELL NEOPLASM.  The plasma cell component is increased in the marrow representing an estimated 18% of all cells. Cytogenetics with 13q-, 17p- (high risk disease)      09/03/2014 Pathology Results    Cytogenetics with 13q-, 17p- (high risk disease)      09/23/2014 Initial Diagnosis    Multiple myeloma      09/30/2014 PET scan    No abnormal hypermetabolism in the neck, chest, abdomen or pelvis.      10/05/2014 - 03/22/2015 Chemotherapy    RVD      10/28/2014 Treatment Plan Change    Issues related to getting Revlimid in a timely fashion, therefore, she received her Revlimid on 9/21 resulting in a 12 day cycle this time instead of a 14 day cycle      11/02/2014 Imaging    CTA chest- No evidence for a large or central pulmonary embolism as described.  8 mm density along the right minor fissure could represent focal pleural thickening but indeterminate. If the patient is at high risk for bronchogenic carcinoma, follow-up c      11/23/2014 Miscellaneous    Zometa 4 mg IV monthly      04/07/2015 Bone  Marrow Biopsy    Normocellular marrow with 2-4% clonal plasma cells by immunohistochemistry. FISH and cytogenetics were normal [facility])        04/2015 Miscellaneous    PRETRANSPLANT EVALUATION:  Pulmonary function tests: FEV1 100.3% / DLCO 97.9%  Echocardiogram: Normal LV function with EF 60-65%       04/27/2015 Procedure    Stem cell mobilization with filgrastim and Mozobil [facility])      05/06/2015 Miscellaneous    BMT conditioning regimen with high-dose Melphalan given [facility], [name]); Day -1      05/07/2015 Bone Marrow Transplant    Outpatient autologous stem cell transplant [facility], [name]); Day 0       05/18/2015 Miscellaneous    WBC engraftment;  did not require platelet transfusion during her transplant process. Lowest platelet count 28,000       05/20/2015 Procedure    Tunneled catheter removed [facility])      09/08/2015 -  Chemotherapy    Velcade every 2 weeks      12/15/2015 Miscellaneous    Zometa re-instituted.       12/23/2015 Imaging    Bone density- BMD as determined from Forearm Radius 33% is 0.799 g/cm2 with a T-Score of 1.2. This patient is considered normal according to [facility]) criteria.      04/17/2016 Miscellaneous    Started maintenance with Ninlaro.      05/04/2016 Treatment Plan Change    Zometa switched to Xgeva injection monthly given difficult IV access.         Problem List Patient Active Problem List   Diagnosis Date Noted  . Claustrophobia [F40.240] 10/05/2014  . Multiple myeloma ([place]) [C90.00] 09/23/2014  . Normocytic hypochromic anemia [D50.9] 08/03/2014  . Leukopenia [D72.819] 08/03/2014  . Primary osteoarthritis of both knees [M17.0] 11/18/2013    Past Medical History Past Medical History:  Diagnosis Date  . Anemia   . Arthritis   . Cancer ([place])   . Claustrophobia 10/05/2014  . Hypertension   . Leukopenia 08/03/2014  . Normocytic hypochromic anemia 08/03/2014  . Renal disorder    stage 3     Past Surgical History Past Surgical History:  Procedure Laterality Date  . ABDOMINAL HYSTERECTOMY    . CARDIAC SURGERY    . OTHER SURGICAL HISTORY     heart surgery as infant to "repair hole in heart"    Family History Family History  Problem Relation Age of Onset  . Cancer Mother   . Hypertension Mother   . Cancer Father   . Hypertension Father   . Cancer Maternal Grandmother   . Hypertension Sister   . Hypertension Brother      Social History  reports that she has never smoked. She has never used smokeless tobacco. She reports that she does not drink alcohol or use drugs.  Medications  Current Outpatient  Medications:  .  acetaminophen (TYLENOL) 325 MG tablet, Take by mouth., Disp: , Rfl:  .  acyclovir (ZOVIRAX) 800 MG tablet, TAKE 1 TABLET BY MOUTH TWICE DAILY, Disp: 60 tablet, Rfl: 5 .  cholecalciferol (VITAMIN D) 1000 units tablet, Take 1,000 Units by mouth daily., Disp: , Rfl:  .  hydrochlorothiazide (HYDRODIURIL) 25 MG tablet, TAKE 1 TABLET BY MOUTH ONCE DAILY., Disp: 30 tablet, Rfl: 0 .  hydrocortisone 2.5 % ointment, , Disp: , Rfl:  .  ixazomib citrate (NINLARO) 3 MG capsule, Take 1 capsule (65m) by mouth once a week on days 1, 8 & 15 every  28 day cycle. Take on empty stomach 1 hr before or 2 hrs after food, Disp: 3 capsule, Rfl: 4 .  Multiple Vitamin (TAB-A-VITE) TABS, TAKE 1 TABLET BY MOUTH ONCE DAILY., Disp: 30 tablet, Rfl: 11 .  potassium chloride SA (K-DUR,KLOR-CON) 20 MEQ tablet, Take 1 tablet (20 mEq total) by mouth 2 (two) times daily., Disp: 60 tablet, Rfl: 2 .  PREVIDENT 5000 BOOSTER PLUS 1.1 % PSTE, , Disp: , Rfl:  .  zolendronic acid (ZOMETA) 4 MG/5ML injection, Inject 4 mg into the vein every 28 (twenty-eight) days., Disp: , Rfl:  No current facility-administered medications for this visit.   Facility-Administered Medications Ordered in Other Visits:  .  heparin lock flush 100 unit/mL, 500 Units, Intravenous, Once, [place], Shannon K, MD .  sodium chloride 0.9 % injection 10 mL, 10 mL, Intravenous, Once, Pen[place], Kelby Fam, MD  Allergies Patient has no known allergies.  Review of Systems Review of Systems - Oncology ROS as per HPI otherwise 12 point ROS is negative.   Physical Exam  Vitals Wt Readings from Last 3 Encounters:  06/05/17 287 lb 6.4 oz (130.4 kg)  03/13/17 298 lb (135.2 kg)  02/13/17 (!) 305 lb (138.3 kg)   Temp Readings from Last 3 Encounters:  06/05/17 98.7 F (37.1 C) (Oral)  03/13/17 98.7 F (37.1 C) (Oral)  03/13/17 98.8 F (37.1 C) (Oral)   BP Readings from Last 3 Encounters:  06/05/17 (!) 142/89  03/13/17 111/70  03/13/17 123/64    Pulse Readings from Last 3 Encounters:  06/05/17 78  03/13/17 80  03/13/17 86    Constitutional: Well-developed, well-nourished, and in no distress.   HENT: Head: Normocephalic and atraumatic.  Mouth/Throat: No oropharyngeal exudate. Mucosa moist. Eyes: Pupils are equal, round, and reactive to light. Conjunctivae are normal. No scleral icterus.  Neck: Normal range of motion. Neck supple. No JVD present.  Cardiovascular: Normal rate, regular rhythm and normal heart sounds.  Exam reveals no gallop and no friction rub.   No murmur heard. Pulmonary/Chest: Effort normal and breath sounds normal. No respiratory distress. No wheezes.No rales.  Abdominal: Soft. Bowel sounds are normal. No distension. There is no tenderness. There is no guarding.  Musculoskeletal: No edema or tenderness.  Lymphadenopathy: No cervical, axillary or supraclavicular adenopathy.  Neurological: Alert and oriented to person, place, and time. No cranial nerve deficit.  Skin: Skin is warm and dry. No rash noted. No erythema. No pallor.  Psychiatric: Affect and judgment normal.   Labs Lab on 06/05/2017  Component Date Value Ref Range Status  . WBC 06/05/2017 5.8  4.0 - 10.5 K/uL Final  . RBC 06/05/2017 3.66* 3.87 - 5.11 MIL/uL Final  . Hemoglobin 06/05/2017 10.6* 12.0 - 15.0 g/dL Final  . HCT 06/05/2017 33.0* 36.0 - 46.0 % Final  . MCV 06/05/2017 90.2  78.0 - 100.0 fL Final  . MCH 06/05/2017 29.0  26.0 - 34.0 pg Final  . MCHC 06/05/2017 32.1  30.0 - 36.0 g/dL Final  . RDW 06/05/2017 15.2  11.5 - 15.5 % Final  . Platelets 06/05/2017 219  150 - 400 K/uL Final  . Neutrophils Relative % 06/05/2017 71  % Final  . Neutro Abs 06/05/2017 4.1  1.7 - 7.7 K/uL Final  . Lymphocytes Relative 06/05/2017 22  % Final  . Lymphs Abs 06/05/2017 1.3  0.7 - 4.0 K/uL Final  . Monocytes Relative 06/05/2017 6  % Final  . Monocytes Absolute 06/05/2017 0.3  0.1 - 1.0 K/uL Final  . Eosinophils  Relative 06/05/2017 1  % Final  .  Eosinophils Absolute 06/05/2017 0.1  0.0 - 0.7 K/uL Final  . Basophils Relative 06/05/2017 0  % Final  . Basophils Absolute 06/05/2017 0.0  0.0 - 0.1 K/uL Final   Performed at [facility], [address] [place], [place] [address]  . Sodium 06/05/2017 140  135 - 145 mmol/L Final  . Potassium 06/05/2017 4.0  3.5 - 5.1 mmol/L Final  . Chloride 06/05/2017 103  101 - 111 mmol/L Final  . CO2 06/05/2017 26  22 - 32 mmol/L Final  . Glucose, Bld 06/05/2017 98  65 - 99 mg/dL Final  . BUN 06/05/2017 26* 6 - 20 mg/dL Final  . Creatinine, Ser 06/05/2017 1.11* 0.44 - 1.00 mg/dL Final  . Calcium 06/05/2017 10.2  8.9 - 10.3 mg/dL Final  . Total Protein 06/05/2017 8.3* 6.5 - 8.1 g/dL Final  . Albumin 06/05/2017 4.2  3.5 - 5.0 g/dL Final  . AST 06/05/2017 224* 15 - 41 U/L Final  . ALT 06/05/2017 165* 14 - 54 U/L Final  . Alkaline Phosphatase 06/05/2017 132* 38 - 126 U/L Final  . Total Bilirubin 06/05/2017 0.7  0.3 - 1.2 mg/dL Final  . GFR calc non Af Amer 06/05/2017 53* >60 mL/min Final  . GFR calc Af Amer 06/05/2017 >60  >60 mL/min Final   Comment: (NOTE) The eGFR has been calculated using the CKD EPI equation. This calculation has not been validated in all clinical situations. eGFR's persistently <60 mL/min signify possible Chronic Kidney Disease.   Georgiann Hahn gap 06/05/2017 11  5 - 15 Final   Performed at [facility], [address] [place], [place] [address]  . LDH 06/05/2017 375* 98 - 192 U/L Final   Performed at [facility], [address] [place], [place] [address]  . Magnesium 06/05/2017 1.9  1.7 - 2.4 mg/dL Final   Performed at [facility], [address] [place], [place] [address]     Pathology Orders Placed This Encounter  Procedures  . Comprehensive metabolic panel    Standing Status:   Future    Standing Expiration Date:   06/06/2018  . CBC with Differential/Platelet    Standing Status:   Future    Standing Expiration Date:   06/06/2018  . Comprehensive metabolic panel     Standing Status:   Future    Standing Expiration Date:   06/06/2018  . Lactate dehydrogenase    Standing Status:   Future    Standing Expiration Date:   06/06/2018  . Protein electrophoresis, serum    Standing Status:   Future    Standing Expiration Date:   06/06/2018  . Kappa/lambda light chains    Standing Status:   Future    Standing Expiration Date:   06/06/2018  . Beta 2 microglobulin, serum    Standing Status:   Future    Standing Expiration Date:   06/06/2018  . IgG, IgA, IgM    Standing Status:   Future    Standing Expiration Date:   06/06/2018  . Hepatitis C RNA quantitative    Standing Status:   Future    Standing Expiration Date:   06/06/2018  . Hepatitis B surface antibody    Standing Status:   Future    Standing Expiration Date:   06/06/2018  . Hepatitis B surface antigen    Standing Status:   Future    Standing Expiration Date:   06/06/2018  . Hepatitis B core antibody, total  Standing Status:   Future    Standing Expiration Date:   06/06/2018  . Hepatitis panel, acute    Standing Status:   Future    Standing Expiration Date:   06/06/2018       Zoila Shutter MD

## 2017-06-05 NOTE — Patient Instructions (Signed)
[facility] at [facility] Discharge Instructions  Received Zometa infusion today. Follow-up as scheduled. Call clinic for any questions or concerns   Thank you for choosing [facility] at [facility] to provide your oncology and hematology care.  To afford each patient quality time with our provider, please arrive at least 15 minutes before your scheduled appointment time.   If you have a lab appointment with the Cancer Center please come in thru the  Main Entrance and check in at the main information desk  You need to re-schedule your appointment should you arrive 10 or more minutes late.  We strive to give you quality time with our providers, and arriving late affects you and other patients whose appointments are after yours.  Also, if you no show three or more times for appointments you may be dismissed from the clinic at the providers discretion.     Again, thank you for choosing [facility].  Our hope is that these requests will decrease the amount of time that you wait before being seen by our physicians.       _____________________________________________________________  Should you have questions after your visit to  Cancer Center, please contact our office at [phone] between the hours of 8:30 a.m. and 4:30 p.m.  Voicemails left after 4:30 p.m. will not be returned until the following business day.  For prescription refill requests, have your pharmacy contact our office.       Resources For Cancer Patients and their Caregivers ? American Cancer Society: Can assist with transportation, wigs, general needs, runs Look Good Feel Better.        1-888-227-6333 ? Cancer Care: Provides financial assistance, online support groups, medication/co-pay assistance.  1-800-813-HOPE (4673) ? [facility] Assists [place] cancer patients and their families through emotional , educational and  financial support.  336-427-4357 ? [name] Where to apply for food stamps, Medicaid and utility assistance. 336-342-1394 ? [name]: Transportation to medical appointments. 336-347-2287 ? Social Security Administration: May apply for disability if have a Stage IV cancer. 336-342-7796 1-800-772-1213 ? [place] Aging, Disability and Transit Services: Assists with nutrition, care and transit needs. 336-349-2343  Cancer Center Support Programs:   > Cancer Support Group  2nd Tuesday of the month 1pm-2pm, Journey Room   > Creative Journey  3rd Tuesday of the month 1130am-1pm, Journey Room    

## 2017-06-06 LAB — PROTEIN ELECTROPHORESIS, SERUM
A/G RATIO SPE: 1.1 (ref 0.7–1.7)
ALBUMIN ELP: 4 g/dL (ref 2.9–4.4)
ALPHA-2-GLOBULIN: 0.7 g/dL (ref 0.4–1.0)
Alpha-1-Globulin: 0.3 g/dL (ref 0.0–0.4)
BETA GLOBULIN: 1 g/dL (ref 0.7–1.3)
Gamma Globulin: 1.8 g/dL (ref 0.4–1.8)
Globulin, Total: 3.8 g/dL (ref 2.2–3.9)
M-Spike, %: 0.5 g/dL — ABNORMAL HIGH
Total Protein ELP: 7.8 g/dL (ref 6.0–8.5)

## 2017-06-06 LAB — KAPPA/LAMBDA LIGHT CHAINS
KAPPA FREE LGHT CHN: 32 mg/L — AB (ref 3.3–19.4)
Kappa, lambda light chain ratio: 2.35 — ABNORMAL HIGH (ref 0.26–1.65)
LAMDA FREE LIGHT CHAINS: 13.6 mg/L (ref 5.7–26.3)

## 2017-06-06 LAB — IGG, IGA, IGM
IgA: 130 mg/dL (ref 87–352)
IgG (Immunoglobin G), Serum: 1849 mg/dL — ABNORMAL HIGH (ref 700–1600)
IgM (Immunoglobulin M), Srm: 82 mg/dL (ref 26–217)

## 2017-06-07 LAB — IMMUNOFIXATION ELECTROPHORESIS
IGA: 131 mg/dL (ref 87–352)
IgG (Immunoglobin G), Serum: 1904 mg/dL — ABNORMAL HIGH (ref 700–1600)
IgM (Immunoglobulin M), Srm: 85 mg/dL (ref 26–217)
Total Protein ELP: 7.8 g/dL (ref 6.0–8.5)

## 2017-06-11 ENCOUNTER — Other Ambulatory Visit (HOSPITAL_COMMUNITY): Payer: Self-pay | Admitting: Adult Health

## 2017-06-11 DIAGNOSIS — C9000 Multiple myeloma not having achieved remission: Secondary | ICD-10-CM

## 2017-06-12 ENCOUNTER — Inpatient Hospital Stay (HOSPITAL_COMMUNITY): Payer: Medicare Other | Attending: Hematology

## 2017-06-12 DIAGNOSIS — C9001 Multiple myeloma in remission: Secondary | ICD-10-CM | POA: Insufficient documentation

## 2017-06-12 DIAGNOSIS — Z79899 Other long term (current) drug therapy: Secondary | ICD-10-CM | POA: Diagnosis not present

## 2017-06-12 DIAGNOSIS — R7989 Other specified abnormal findings of blood chemistry: Secondary | ICD-10-CM | POA: Insufficient documentation

## 2017-06-13 LAB — HEPATITIS B SURFACE ANTIBODY,QUALITATIVE: Hep B S Ab: REACTIVE

## 2017-06-13 LAB — HEPATITIS PANEL, ACUTE
HEP B C IGM: NEGATIVE
HEP B S AG: NEGATIVE
Hep A IgM: NEGATIVE

## 2017-06-13 LAB — HEPATITIS B CORE ANTIBODY, TOTAL: HEP B C TOTAL AB: NEGATIVE

## 2017-06-13 LAB — HEPATITIS B SURFACE ANTIGEN: HEP B S AG: NEGATIVE

## 2017-06-14 ENCOUNTER — Inpatient Hospital Stay (HOSPITAL_COMMUNITY): Payer: Medicare Other

## 2017-06-14 DIAGNOSIS — C9001 Multiple myeloma in remission: Secondary | ICD-10-CM | POA: Diagnosis not present

## 2017-06-14 DIAGNOSIS — C9000 Multiple myeloma not having achieved remission: Secondary | ICD-10-CM

## 2017-06-14 LAB — COMPREHENSIVE METABOLIC PANEL
ALK PHOS: 66 U/L (ref 38–126)
ALT: 18 U/L (ref 14–54)
ANION GAP: 9 (ref 5–15)
AST: 20 U/L (ref 15–41)
Albumin: 4 g/dL (ref 3.5–5.0)
BILIRUBIN TOTAL: 0.7 mg/dL (ref 0.3–1.2)
BUN: 18 mg/dL (ref 6–20)
CALCIUM: 9.4 mg/dL (ref 8.9–10.3)
CO2: 26 mmol/L (ref 22–32)
Chloride: 104 mmol/L (ref 101–111)
Creatinine, Ser: 0.96 mg/dL (ref 0.44–1.00)
GFR calc non Af Amer: >60 mL/min (ref >60)
Glucose, Bld: 91 mg/dL (ref 65–99)
Potassium: 4 mmol/L (ref 3.5–5.1)
Sodium: 139 mmol/L (ref 135–145)
TOTAL PROTEIN: 7.9 g/dL (ref 6.5–8.1)

## 2017-06-16 ENCOUNTER — Other Ambulatory Visit (HOSPITAL_COMMUNITY): Payer: Self-pay | Admitting: Adult Health

## 2017-06-16 DIAGNOSIS — E876 Hypokalemia: Secondary | ICD-10-CM

## 2017-07-04 ENCOUNTER — Ambulatory Visit (INDEPENDENT_AMBULATORY_CARE_PROVIDER_SITE_OTHER): Payer: Medicare Other | Admitting: Orthopedic Surgery

## 2017-07-04 ENCOUNTER — Encounter: Payer: Self-pay | Admitting: Orthopedic Surgery

## 2017-07-04 DIAGNOSIS — M1712 Unilateral primary osteoarthritis, left knee: Secondary | ICD-10-CM

## 2017-07-04 NOTE — Progress Notes (Signed)
Progress Note   Patient ID: Kelli Hurley, female   DOB: 1955-11-24, 62 y.o.   MRN: [id number]  Chief Complaint  Patient presents with  . Knee Pain    left     Medical decision-making Encounter Diagnoses  Name Primary?  . Severe obesity (BMI >= 40) (HCC) Yes  . Primary osteoarthritis of left knee      PLAN:  Problem #1 severe obesity: Patient will continue to work on weight loss with a goal weight of 215 pounds  Problem #2 primary osteoarthritis chronic pain left knee: She will use Tylenol arthritis and topical arthritis creams as needed  In 6 months we will take new set of films of her left knee  We both agree that since she is improving with the weight loss that she has done that the surgery will only be needed if she is having extreme pain or loss of function    No orders of the defined types were placed in this encounter.        Chief Complaint  Patient presents with  . Knee Pain    left    62 year old female with osteoarthritis and chronic pain left knee history of obesity with BMI greater than 50.  Presents after several months of attempting to lose weight which she did get down to 287 and tried diclofenac but because of her other medical problems was advised not to take it.  However, she says that her knee pain is a little bit less with the weight loss    Review of Systems  Constitutional: Negative for malaise/fatigue.  Respiratory: Negative for shortness of breath.   Musculoskeletal: Negative for back pain.   Current Meds  Medication Sig  . acetaminophen (TYLENOL) 325 MG tablet Take by mouth.  Marland Kitchen acyclovir (ZOVIRAX) 800 MG tablet TAKE 1 TABLET BY MOUTH TWICE DAILY  . cholecalciferol (VITAMIN D) 1000 units tablet Take 1,000 Units by mouth daily.  . hydrochlorothiazide (HYDRODIURIL) 25 MG tablet TAKE 1 TABLET BY MOUTH ONCE DAILY.  . hydrocortisone 2.5 % ointment   . ixazomib citrate (NINLARO) 3 MG capsule Take 1 capsule (3mg ) by mouth once a week  on days 1, 8 & 15 every 28 day cycle. Take on empty stomach 1 hr before or 2 hrs after food  . Multiple Vitamin (TAB-A-VITE) TABS TAKE 1 TABLET BY MOUTH ONCE DAILY.  Marland Kitchen potassium chloride SA (K-DUR,KLOR-CON) 20 MEQ tablet TAKE 1 TABLET BY MOUTH TWICE DAILY  . PREVIDENT 5000 BOOSTER PLUS 1.1 % PSTE   . zolendronic acid (ZOMETA) 4 MG/5ML injection Inject 4 mg into the vein every 28 (twenty-eight) days.    No Known Allergies   BP (!) 155/97   Pulse 74   Ht 5\' 2"  (1.575 m)   Wt 287 lb (130.2 kg)   BMI 52.49 kg/m   Physical Exam  Constitutional: She is oriented to person, place, and time. She appears well-developed and well-nourished.  Cardiovascular: Intact distal pulses.  Musculoskeletal:       Legs: Neurological: She is alert and oriented to person, place, and time.  Skin: Skin is warm and dry.  Psychiatric: She has a normal mood and affect. Her behavior is normal.       Arther Abbott, MD 07/04/2017 10:09 AM

## 2017-07-04 NOTE — Patient Instructions (Signed)
Tylenol arthritis  aspercreme with lidocaine   Continue weight loss

## 2017-07-05 ENCOUNTER — Inpatient Hospital Stay (HOSPITAL_COMMUNITY): Payer: Medicare Other

## 2017-07-05 ENCOUNTER — Ambulatory Visit (HOSPITAL_COMMUNITY): Payer: Medicare Other | Admitting: Internal Medicine

## 2017-07-05 DIAGNOSIS — C9001 Multiple myeloma in remission: Secondary | ICD-10-CM

## 2017-07-05 LAB — CBC WITH DIFFERENTIAL/PLATELET
Basophils Absolute: 0 10*3/uL (ref 0.0–0.1)
Basophils Relative: 0 %
Eosinophils Absolute: 0.1 10*3/uL (ref 0.0–0.7)
Eosinophils Relative: 2 %
HCT: 32.4 % — ABNORMAL LOW (ref 36.0–46.0)
HEMOGLOBIN: 10.6 g/dL — AB (ref 12.0–15.0)
LYMPHS PCT: 30 %
Lymphs Abs: 1.3 10*3/uL (ref 0.7–4.0)
MCH: 29.6 pg (ref 26.0–34.0)
MCHC: 32.7 g/dL (ref 30.0–36.0)
MCV: 90.5 fL (ref 78.0–100.0)
Monocytes Absolute: 0.3 10*3/uL (ref 0.1–1.0)
Monocytes Relative: 7 %
NEUTROS PCT: 61 %
Neutro Abs: 2.6 10*3/uL (ref 1.7–7.7)
Platelets: 227 10*3/uL (ref 150–400)
RBC: 3.58 MIL/uL — AB (ref 3.87–5.11)
RDW: 14.7 % (ref 11.5–15.5)
WBC: 4.3 10*3/uL (ref 4.0–10.5)

## 2017-07-05 LAB — COMPREHENSIVE METABOLIC PANEL
ALT: 42 U/L (ref 14–54)
AST: 26 U/L (ref 15–41)
Albumin: 3.9 g/dL (ref 3.5–5.0)
Alkaline Phosphatase: 94 U/L (ref 38–126)
Anion gap: 9 (ref 5–15)
BUN: 19 mg/dL (ref 6–20)
CO2: 29 mmol/L (ref 22–32)
Calcium: 9.7 mg/dL (ref 8.9–10.3)
Chloride: 102 mmol/L (ref 101–111)
Creatinine, Ser: 0.97 mg/dL (ref 0.44–1.00)
Glucose, Bld: 97 mg/dL (ref 65–99)
POTASSIUM: 3.9 mmol/L (ref 3.5–5.1)
Sodium: 140 mmol/L (ref 135–145)
Total Bilirubin: 0.4 mg/dL (ref 0.3–1.2)
Total Protein: 8.1 g/dL (ref 6.5–8.1)

## 2017-07-05 LAB — LACTATE DEHYDROGENASE: LDH: 147 U/L (ref 98–192)

## 2017-07-06 LAB — KAPPA/LAMBDA LIGHT CHAINS
KAPPA FREE LGHT CHN: 31.5 mg/L — AB (ref 3.3–19.4)
KAPPA, LAMDA LIGHT CHAIN RATIO: 2.48 — AB (ref 0.26–1.65)
Lambda free light chains: 12.7 mg/L (ref 5.7–26.3)

## 2017-07-06 LAB — IGG, IGA, IGM
IgA: 151 mg/dL (ref 87–352)
IgG (Immunoglobin G), Serum: 1817 mg/dL — ABNORMAL HIGH (ref 700–1600)
IgM (Immunoglobulin M), Srm: 64 mg/dL (ref 26–217)

## 2017-07-06 LAB — BETA 2 MICROGLOBULIN, SERUM: BETA 2 MICROGLOBULIN: 2.4 mg/L (ref 0.6–2.4)

## 2017-07-08 LAB — PROTEIN ELECTROPHORESIS, SERUM
A/G Ratio: 1.1 (ref 0.7–1.7)
ALPHA-1-GLOBULIN: 0.3 g/dL (ref 0.0–0.4)
Albumin ELP: 3.8 g/dL (ref 2.9–4.4)
Alpha-2-Globulin: 0.7 g/dL (ref 0.4–1.0)
Beta Globulin: 0.9 g/dL (ref 0.7–1.3)
GAMMA GLOBULIN: 1.6 g/dL (ref 0.4–1.8)
Globulin, Total: 3.5 g/dL (ref 2.2–3.9)
M-SPIKE, %: 0.5 g/dL — AB
TOTAL PROTEIN ELP: 7.3 g/dL (ref 6.0–8.5)

## 2017-07-09 ENCOUNTER — Other Ambulatory Visit (HOSPITAL_COMMUNITY): Payer: Self-pay

## 2017-07-09 DIAGNOSIS — E876 Hypokalemia: Secondary | ICD-10-CM

## 2017-07-09 MED ORDER — POTASSIUM CHLORIDE CRYS ER 20 MEQ PO TBCR: 20.0000 meq | EXTENDED_RELEASE_TABLET | Freq: Two times a day (BID) | ORAL | 0 refills | Status: DC

## 2017-07-09 NOTE — Telephone Encounter (Signed)
Refill request for Potassium 5meq.  Chart checked and reviewed with Dr. Walden Field and ok to refill.

## 2017-07-12 ENCOUNTER — Inpatient Hospital Stay (HOSPITAL_COMMUNITY): Payer: Medicare Other | Attending: Hematology | Admitting: Internal Medicine

## 2017-07-12 ENCOUNTER — Other Ambulatory Visit: Payer: Self-pay

## 2017-07-12 ENCOUNTER — Encounter (HOSPITAL_COMMUNITY): Payer: Self-pay | Admitting: Internal Medicine

## 2017-07-12 VITALS — BP 157/70 | HR 79 | Temp 98.7°F | Resp 18 | Wt 290.5 lb

## 2017-07-12 DIAGNOSIS — R7989 Other specified abnormal findings of blood chemistry: Secondary | ICD-10-CM | POA: Diagnosis not present

## 2017-07-12 DIAGNOSIS — C9001 Multiple myeloma in remission: Secondary | ICD-10-CM | POA: Diagnosis not present

## 2017-07-12 DIAGNOSIS — I10 Essential (primary) hypertension: Secondary | ICD-10-CM | POA: Insufficient documentation

## 2017-07-12 DIAGNOSIS — Z9484 Stem cells transplant status: Secondary | ICD-10-CM | POA: Insufficient documentation

## 2017-07-12 NOTE — Patient Instructions (Signed)
[place] at [facility] Discharge Instructions   You were seen today by Dr. Zoila Shutter. Continue monthly labs. Return in July for labs, follow up and Zometa.    Thank you for choosing [place] at [facility] to provide your oncology and hematology care.  To afford each patient quality time with our provider, please arrive at least 15 minutes before your scheduled appointment time.    If you have a lab appointment with the [place] please come in thru the  Main Entrance and check in at the main information desk  You need to re-schedule your appointment should you arrive 10 or more minutes late.  We strive to give you quality time with our providers, and arriving late affects you and other patients whose appointments are after yours.  Also, if you no show three or more times for appointments you may be dismissed from the clinic at the providers discretion.     Again, thank you for choosing [facility].  Our hope is that these requests will decrease the amount of time that you wait before being seen by our physicians.       _____________________________________________________________  Should you have questions after your visit to [facility], please contact our office at [phone] between the hours of 8:30 a.m. and 4:30 p.m.  Voicemails left after 4:30 p.m. will not be returned until the following business day.  For prescription refill requests, have your pharmacy contact our office.       Resources For Cancer Patients and their Caregivers ? American Cancer Society: Can assist with transportation, wigs, general needs, runs Look Good Feel Better.        (210)872-6059 ? Cancer Care: Provides financial assistance, online support groups, medication/co-pay assistance.  1-800-813-HOPE 825-577-8125) ? [facility] Assists [place] cancer patients and their families through emotional ,  educational and financial support.  315-135-8815 ? [name] Where to apply for food stamps, Medicaid and utility assistance. 2126885421 ? [name]: Transportation to medical appointments. 604-240-6547 ? Social Security Administration: May apply for disability if have a Stage IV cancer. 306-323-4315 480-505-3868 ? [name], Disability and Transit Services: Assists with nutrition, care and transit needs. [place] Support Programs:   > Cancer Support Group  2nd Tuesday of the month 1pm-2pm, Journey Room   > Creative Journey  3rd Tuesday of the month 1130am-1pm, Journey Room

## 2017-07-12 NOTE — Progress Notes (Signed)
Diagnosis Multiple myeloma in remission ([place]) - Plan: CBC with Differential/Platelet, Comprehensive metabolic panel, Lactate dehydrogenase, Protein electrophoresis, serum, Kappa/lambda light chains, Beta 2 microglobulin, serum, IgG, IgA, IgM, CANCELED: CBC with Differential/Platelet, CANCELED: Comprehensive metabolic panel, CANCELED: Lactate dehydrogenase, CANCELED: Protein electrophoresis, serum, CANCELED: Kappa/lambda light chains, CANCELED: IgG, IgA, IgM, CANCELED: Beta 2 microglobulin, serum  Staging Cancer Staging Multiple myeloma (HCC) Staging form: Multiple Myeloma, AJCC 6th Edition - Clinical stage from 10/26/2014: Stage IIA - Signed by Baird Cancer, PA-C on 10/26/2014 - Pathologic: No stage assigned - Unsigned  CURRENT THERAPY: Maintenance Ixaxomib (Ninlaro) 3 mg po on Days 1, 8, & 15 beginning 04/17/16 & Zometa every 3 months  Assessment and Plan:  10.  MM.  62 year old female with IgG kappa myeloma with high risk features -17 p and  -13 q..  She was stage II by R- ISS and achieved a PR prior to transplant.  Pt is s/p stem cell transplant on 05/07/15.  She will continue Maintenance Ixaxomib (Ninlaro) 3 mg po on Days 1, 8, & 15.  Definition of progression would mean M spike greater than 0.74.  Labs done 07/05/2017 reviewed with pt and show WBC 4.3, HB 10.6 plts 227,000.  SPEP stable with m spike measuring 0.5 g/dl.  Chemistries WNL with Cr of 0.97/  She will continue to have monthly labs and will continue Zometa every 3 months for 2 years. She will be seen every 3 months with Zometa.    2.  Prophylaxis.  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    3.  Elevated liver function tests.  Resolved.  Review of labs from office visit at [facility] on 05/23/2017 showed normal liver function tests.  Pt has repeat labs that showed LFTs WNL.  Labs done 07/05/2017 reviewed with pt and show normal LFTs.  She will have repeat labs in 1 month.  Hep  panel negative other that + Hep B SAB.     4.  HTN.  BP is 157/70.  Continue to follow-up with PCP.    Interval History:  Multiple Myeloma s/p stem cell transplant on 05/07/15  Current Status:  Pt is seen today for follow-up.  She is here to go over labs.     Multiple myeloma ([place])   08/07/2014 Imaging    Bone Survey- No lytic lesions are noted in the visualized skeleton.      09/03/2014 Bone Marrow Biopsy    NORMOCELLULAR BONE MARROW FOR AGE WITH PLASMA CELL NEOPLASM.  The plasma cell component is increased in the marrow representing an estimated 18% of all cells. Cytogenetics with 13q-, 17p- (high risk disease)      09/03/2014 Pathology Results    Cytogenetics with 13q-, 17p- (high risk disease)      09/23/2014 Initial Diagnosis    Multiple myeloma      09/30/2014 PET scan    No abnormal hypermetabolism in the neck, chest, abdomen or pelvis.      10/05/2014 - 03/22/2015 Chemotherapy    RVD      10/28/2014 Treatment Plan Change    Issues related to getting Revlimid in a timely fashion, therefore, she received her Revlimid on 9/21 resulting in a 12 day cycle this time instead of a 14 day cycle      11/02/2014 Imaging    CTA chest- No evidence for a large or central pulmonary embolism as described.  8 mm density along the right minor fissure  could represent focal pleural thickening but indeterminate. If the patient is at high risk for bronchogenic carcinoma, follow-up c      11/23/2014 Miscellaneous    Zometa 4 mg IV monthly      04/07/2015 Bone Marrow Biopsy    Normocellular marrow with 2-4% clonal plasma cells by immunohistochemistry. FISH and cytogenetics were normal [facility])        04/2015 Miscellaneous    PRETRANSPLANT EVALUATION:  Pulmonary function tests: FEV1 100.3% / DLCO 97.9%  Echocardiogram: Normal LV function with EF 60-65%       04/27/2015 Procedure    Stem cell mobilization with filgrastim and Mozobil [facility])      05/06/2015 Miscellaneous    BMT conditioning  regimen with high-dose Melphalan given [facility] [name]); Day -1      05/07/2015 Bone Marrow Transplant    Outpatient autologous stem cell transplant [facility] [name]); Day 0      05/18/2015 Miscellaneous    WBC engraftment;  did not require platelet transfusion during her transplant process. Lowest platelet count 28,000       05/20/2015 Procedure    Tunneled catheter removed [facility])      09/08/2015 -  Chemotherapy    Velcade every 2 weeks      12/15/2015 Miscellaneous    Zometa re-instituted.       12/23/2015 Imaging    Bone density- BMD as determined from Forearm Radius 33% is 0.799 g/cm2 with a T-Score of 1.2. This patient is considered normal according to [facility]) criteria.      04/17/2016 Miscellaneous    Started maintenance with Ninlaro.      05/04/2016 Treatment Plan Change    Zometa switched to Xgeva injection monthly given difficult IV access.         Problem List Patient Active Problem List   Diagnosis Date Noted  . Claustrophobia [F40.240] 10/05/2014  . Multiple myeloma ([place]) [C90.00] 09/23/2014  . Normocytic hypochromic anemia [D50.9] 08/03/2014  . Leukopenia [D72.819] 08/03/2014  . Primary osteoarthritis of both knees [M17.0] 11/18/2013    Past Medical History Past Medical History:  Diagnosis Date  . Anemia   . Arthritis   . Cancer ([place])   . Claustrophobia 10/05/2014  . Hypertension   . Leukopenia 08/03/2014  . Normocytic hypochromic anemia 08/03/2014  . Renal disorder    stage 3     Past Surgical History Past Surgical History:  Procedure Laterality Date  . ABDOMINAL HYSTERECTOMY    . CARDIAC SURGERY    . OTHER SURGICAL HISTORY     heart surgery as infant to "repair hole in heart"    Family History Family History  Problem Relation Age of Onset  . Cancer Mother   . Hypertension Mother   . Cancer Father   . Hypertension Father   . Cancer Maternal Grandmother   . Hypertension Sister   .  Hypertension Brother      Social History  reports that she has never smoked. She has never used smokeless tobacco. She reports that she does not drink alcohol or use drugs.  Medications  Current Outpatient Medications:  .  acetaminophen (TYLENOL) 325 MG tablet, Take by mouth., Disp: , Rfl:  .  acyclovir (ZOVIRAX) 800 MG tablet, TAKE 1 TABLET BY MOUTH TWICE DAILY, Disp: 60 tablet, Rfl: 5 .  cholecalciferol (VITAMIN D) 1000 units tablet, Take 1,000 Units by mouth daily., Disp: , Rfl:  .  hydrochlorothiazide (HYDRODIURIL) 25 MG tablet, TAKE 1 TABLET BY MOUTH ONCE  DAILY., Disp: 30 tablet, Rfl: 2 .  hydrocortisone 2.5 % ointment, , Disp: , Rfl:  .  ixazomib citrate (NINLARO) 3 MG capsule, Take 1 capsule ('3mg'$ ) by mouth once a week on days 1, 8 & 15 every 28 day cycle. Take on empty stomach 1 hr before or 2 hrs after food, Disp: 3 capsule, Rfl: 4 .  Multiple Vitamin (TAB-A-VITE) TABS, TAKE 1 TABLET BY MOUTH ONCE DAILY., Disp: 30 tablet, Rfl: 11 .  potassium chloride SA (K-DUR,KLOR-CON) 20 MEQ tablet, Take 1 tablet (20 mEq total) by mouth 2 (two) times daily., Disp: 60 tablet, Rfl: 0 .  PREVIDENT 5000 BOOSTER PLUS 1.1 % PSTE, , Disp: , Rfl:  .  zolendronic acid (ZOMETA) 4 MG/5ML injection, Inject 4 mg into the vein every 28 (twenty-eight) days., Disp: , Rfl:  No current facility-administered medications for this visit.   Facility-Administered Medications Ordered in Other Visits:  .  heparin lock flush 100 unit/mL, 500 Units, Intravenous, Once, Penland, Shannon K, MD .  sodium chloride 0.9 % injection 10 mL, 10 mL, Intravenous, Once, Penland, Kelby Fam, MD  Allergies Patient has no known allergies.  Review of Systems Review of Systems - Oncology ROS as per HPI otherwise 12 point ROS is negative.   Physical Exam  Vitals Wt Readings from Last 3 Encounters:  07/12/17 290 lb 8 oz (131.8 kg)  07/04/17 287 lb (130.2 kg)  06/05/17 287 lb 6.4 oz (130.4 kg)   Temp Readings from Last 3  Encounters:  07/12/17 98.7 F (37.1 C) (Oral)  06/05/17 97.9 F (36.6 C) (Oral)  06/05/17 98.7 F (37.1 C) (Oral)   BP Readings from Last 3 Encounters:  07/12/17 (!) 157/70  07/04/17 (!) 155/97  06/05/17 (!) 111/58   Pulse Readings from Last 3 Encounters:  07/12/17 79  07/04/17 74  06/05/17 73   Constitutional: Well-developed, well-nourished, and in no distress.   HENT: Head: Normocephalic and atraumatic.  Mouth/Throat: No oropharyngeal exudate. Mucosa moist. Eyes: Pupils are equal, round, and reactive to light. Conjunctivae are normal. No scleral icterus.  Neck: Normal range of motion. Neck supple. No JVD present.  Cardiovascular: Normal rate, regular rhythm and normal heart sounds.  Exam reveals no gallop and no friction rub.   No murmur heard. Pulmonary/Chest: Effort normal and breath sounds normal. No respiratory distress. No wheezes.No rales.  Abdominal: Soft. Bowel sounds are normal. No distension. There is no tenderness. There is no guarding.  Musculoskeletal: No edema or tenderness.  Lymphadenopathy: No cervical, axillary or supraclavicular adenopathy.  Neurological: Alert and oriented to person, place, and time. No cranial nerve deficit.  Skin: Skin is warm and dry. No rash noted. No erythema. No pallor.  Psychiatric: Affect and judgment normal.   Labs No visits with results within 3 Day(s) from this visit.  Latest known visit with results is:  Appointment on 07/05/2017  Component Date Value Ref Range Status  . Sodium 07/05/2017 140  135 - 145 mmol/L Final  . Potassium 07/05/2017 3.9  3.5 - 5.1 mmol/L Final  . Chloride 07/05/2017 102  101 - 111 mmol/L Final  . CO2 07/05/2017 29  22 - 32 mmol/L Final  . Glucose, Bld 07/05/2017 97  65 - 99 mg/dL Final  . BUN 07/05/2017 19  6 - 20 mg/dL Final  . Creatinine, Ser 07/05/2017 0.97  0.44 - 1.00 mg/dL Final  . Calcium 07/05/2017 9.7  8.9 - 10.3 mg/dL Final  . Total Protein 07/05/2017 8.1  6.5 - 8.1 g/dL Final  .  Albumin  07/05/2017 3.9  3.5 - 5.0 g/dL Final  . AST 07/05/2017 26  15 - 41 U/L Final  . ALT 07/05/2017 42  14 - 54 U/L Final  . Alkaline Phosphatase 07/05/2017 94  38 - 126 U/L Final  . Total Bilirubin 07/05/2017 0.4  0.3 - 1.2 mg/dL Final  . GFR calc non Af Amer 07/05/2017 >60  >60 mL/min Final  . GFR calc Af Amer 07/05/2017 >60  >60 mL/min Final   Comment: (NOTE) The eGFR has been calculated using the CKD EPI equation. This calculation has not been validated in all clinical situations. eGFR's persistently <60 mL/min signify possible Chronic Kidney Disease.   Georgiann Hahn gap 07/05/2017 9  5 - 15 Final   Performed at [facility], [address], [place], [place] [address]  . WBC 07/05/2017 4.3  4.0 - 10.5 K/uL Final  . RBC 07/05/2017 3.58* 3.87 - 5.11 MIL/uL Final  . Hemoglobin 07/05/2017 10.6* 12.0 - 15.0 g/dL Final  . HCT 07/05/2017 32.4* 36.0 - 46.0 % Final  . MCV 07/05/2017 90.5  78.0 - 100.0 fL Final  . MCH 07/05/2017 29.6  26.0 - 34.0 pg Final  . MCHC 07/05/2017 32.7  30.0 - 36.0 g/dL Final  . RDW 07/05/2017 14.7  11.5 - 15.5 % Final  . Platelets 07/05/2017 227  150 - 400 K/uL Final  . Neutrophils Relative % 07/05/2017 61  % Final  . Neutro Abs 07/05/2017 2.6  1.7 - 7.7 K/uL Final  . Lymphocytes Relative 07/05/2017 30  % Final  . Lymphs Abs 07/05/2017 1.3  0.7 - 4.0 K/uL Final  . Monocytes Relative 07/05/2017 7  % Final  . Monocytes Absolute 07/05/2017 0.3  0.1 - 1.0 K/uL Final  . Eosinophils Relative 07/05/2017 2  % Final  . Eosinophils Absolute 07/05/2017 0.1  0.0 - 0.7 K/uL Final  . Basophils Relative 07/05/2017 0  % Final  . Basophils Absolute 07/05/2017 0.0  0.0 - 0.1 K/uL Final   Performed at [facility], [address], [place], [place] [address]  . LDH 07/05/2017 147  98 - 192 U/L Final   Performed at [facility], [address] [place], [place] [address]  . Total Protein ELP 07/05/2017 7.3  6.0 - 8.5 g/dL Final  . Albumin ELP 07/05/2017 3.8  2.9 - 4.4 g/dL Final  .  Alpha-1-Globulin 07/05/2017 0.3  0.0 - 0.4 g/dL Final  . Alpha-2-Globulin 07/05/2017 0.7  0.4 - 1.0 g/dL Final  . Beta Globulin 07/05/2017 0.9  0.7 - 1.3 g/dL Final  . Gamma Globulin 07/05/2017 1.6  0.4 - 1.8 g/dL Final  . M-Spike, % 07/05/2017 0.5* Not Observed g/dL Final  . SPE Interp. 07/05/2017 Comment   Final   Comment: (NOTE) The SPE pattern demonstrates a single peak (M-spike) in the gamma region which may represent monoclonal protein. This peak may also be caused by circulating immune complexes, cryoglobulins, C-reactive protein, fibrinogen or hemolysis.  If clinically indicated, the presence of a monoclonal gammopathy may be confirmed by immuno- fixation, as well as an evaluation of the urine for the presence of Bence-Jones protein. Performed At: [facility] [place], [place] [id number] Rush Farmer MD [id number]   . Comment 07/05/2017 Comment   Final   Comment: (NOTE) Protein electrophoresis scan will follow via computer, mail, or courier delivery.   Marland Kitchen GLOBULIN, TOTAL 07/05/2017 3.5  2.2 - 3.9 g/dL Corrected  . A/G Ratio 07/05/2017 1.1  0.7 - 1.7 Corrected  Performed at [facility], [address], [place], [place] [address]  . Kappa free light chain 07/05/2017 31.5* 3.3 - 19.4 mg/L Final  . Lamda free light chains 07/05/2017 12.7  5.7 - 26.3 mg/L Final  . Kappa, lamda light chain ratio 07/05/2017 2.48* 0.26 - 1.65 Final   Comment: (NOTE) Performed At: [facility] [place], [place] [id number] Rush Farmer MD [id number] Performed at [facility], [address] [place], [place] [address]   . Beta-2 Microglobulin 07/05/2017 2.4  0.6 - 2.4 mg/L Final   Comment: (NOTE) Siemens Immulite 2000 Immunochemiluminometric assay (ICMA) Values obtained with different assay methods or kits cannot be used interchangeably. Results cannot be interpreted as absolute evidence of the presence or absence of malignant  disease. Performed At: [facility] [place], [place] [id number] Rush Farmer MD [phone]:[id number] Performed at [facility], [address], [place], [place] [address]   . IgG (Immunoglobin G), Serum 07/05/2017 1,817* 700 - 1,600 mg/dL Final  . IgA 07/05/2017 151  87 - 352 mg/dL Final  . IgM (Immunoglobulin M), Srm 07/05/2017 64  26 - 217 mg/dL Final   Comment: (NOTE) Performed At: [facility] [place], [place] [id number] Rush Farmer MD [phone]:[id number] Performed at [facility], [address], [place], [place] [address]      Pathology Orders Placed This Encounter  Procedures  . CBC with Differential/Platelet    Standing Status:   Future    Standing Expiration Date:   07/13/2018  . Comprehensive metabolic panel    Standing Status:   Future    Standing Expiration Date:   07/13/2018  . Lactate dehydrogenase    Standing Status:   Future    Standing Expiration Date:   07/13/2018  . Protein electrophoresis, serum    Standing Status:   Future    Standing Expiration Date:   07/13/2018  . Kappa/lambda light chains    Standing Status:   Future    Standing Expiration Date:   07/13/2018  . Beta 2 microglobulin, serum    Standing Status:   Future    Standing Expiration Date:   07/13/2018  . IgG, IgA, IgM    Standing Status:   Future    Standing Expiration Date:   07/13/2018       Zoila Shutter MD

## 2017-07-18 ENCOUNTER — Other Ambulatory Visit (HOSPITAL_COMMUNITY): Payer: Self-pay | Admitting: Adult Health

## 2017-07-18 DIAGNOSIS — C9000 Multiple myeloma not having achieved remission: Secondary | ICD-10-CM

## 2017-08-14 ENCOUNTER — Other Ambulatory Visit (HOSPITAL_COMMUNITY): Payer: Self-pay | Admitting: Internal Medicine

## 2017-08-14 DIAGNOSIS — E876 Hypokalemia: Secondary | ICD-10-CM

## 2017-08-15 ENCOUNTER — Other Ambulatory Visit (HOSPITAL_COMMUNITY): Payer: Self-pay

## 2017-08-15 DIAGNOSIS — E876 Hypokalemia: Secondary | ICD-10-CM

## 2017-08-15 MED ORDER — POTASSIUM CHLORIDE CRYS ER 20 MEQ PO TBCR: 20.0000 meq | EXTENDED_RELEASE_TABLET | Freq: Two times a day (BID) | ORAL | 0 refills | Status: DC

## 2017-08-15 NOTE — Telephone Encounter (Signed)
Received refill request from patients pharmacy for potassium. Reviewed with provider who authorized 1 more refill than further refills should come from PCP. Prescription sent to pharmacy.

## 2017-08-29 ENCOUNTER — Inpatient Hospital Stay (HOSPITAL_COMMUNITY): Payer: Medicare Other | Attending: Hematology

## 2017-08-29 DIAGNOSIS — Z79899 Other long term (current) drug therapy: Secondary | ICD-10-CM | POA: Insufficient documentation

## 2017-08-29 DIAGNOSIS — Z8249 Family history of ischemic heart disease and other diseases of the circulatory system: Secondary | ICD-10-CM | POA: Diagnosis not present

## 2017-08-29 DIAGNOSIS — Z9221 Personal history of antineoplastic chemotherapy: Secondary | ICD-10-CM | POA: Insufficient documentation

## 2017-08-29 DIAGNOSIS — C9001 Multiple myeloma in remission: Secondary | ICD-10-CM | POA: Diagnosis present

## 2017-08-29 DIAGNOSIS — I10 Essential (primary) hypertension: Secondary | ICD-10-CM | POA: Diagnosis not present

## 2017-08-29 DIAGNOSIS — R7989 Other specified abnormal findings of blood chemistry: Secondary | ICD-10-CM | POA: Insufficient documentation

## 2017-08-29 LAB — CBC WITH DIFFERENTIAL/PLATELET
BASOS ABS: 0 10*3/uL (ref 0.0–0.1)
BASOS PCT: 0 %
Eosinophils Absolute: 0.1 10*3/uL (ref 0.0–0.7)
Eosinophils Relative: 1 %
HEMATOCRIT: 32.8 % — AB (ref 36.0–46.0)
Hemoglobin: 10.9 g/dL — ABNORMAL LOW (ref 12.0–15.0)
LYMPHS PCT: 33 %
Lymphs Abs: 1.4 10*3/uL (ref 0.7–4.0)
MCH: 30.7 pg (ref 26.0–34.0)
MCHC: 33.2 g/dL (ref 30.0–36.0)
MCV: 92.4 fL (ref 78.0–100.0)
MONO ABS: 0.5 10*3/uL (ref 0.1–1.0)
Monocytes Relative: 11 %
NEUTROS ABS: 2.3 10*3/uL (ref 1.7–7.7)
Neutrophils Relative %: 55 %
PLATELETS: 167 10*3/uL (ref 150–400)
RBC: 3.55 MIL/uL — ABNORMAL LOW (ref 3.87–5.11)
RDW: 14.3 % (ref 11.5–15.5)
WBC: 4.2 10*3/uL (ref 4.0–10.5)

## 2017-08-29 LAB — COMPREHENSIVE METABOLIC PANEL
ALBUMIN: 4.2 g/dL (ref 3.5–5.0)
ALT: 61 U/L — ABNORMAL HIGH (ref 0–44)
ANION GAP: 8 (ref 5–15)
AST: 31 U/L (ref 15–41)
Alkaline Phosphatase: 79 U/L (ref 38–126)
BUN: 24 mg/dL — ABNORMAL HIGH (ref 6–20)
CHLORIDE: 103 mmol/L (ref 98–111)
CO2: 27 mmol/L (ref 22–32)
Calcium: 9.8 mg/dL (ref 8.9–10.3)
Creatinine, Ser: 0.95 mg/dL (ref 0.44–1.00)
GFR calc non Af Amer: >60 mL/min (ref >60)
GLUCOSE: 97 mg/dL (ref 70–99)
POTASSIUM: 4 mmol/L (ref 3.5–5.1)
Sodium: 138 mmol/L (ref 135–145)
Total Bilirubin: 0.7 mg/dL (ref 0.3–1.2)
Total Protein: 8 g/dL (ref 6.5–8.1)

## 2017-08-29 LAB — LACTATE DEHYDROGENASE: LDH: 148 U/L (ref 98–192)

## 2017-08-30 LAB — IGG, IGA, IGM
IGA: 115 mg/dL (ref 87–352)
IGM (IMMUNOGLOBULIN M), SRM: 47 mg/dL (ref 26–217)
IgG (Immunoglobin G), Serum: 1739 mg/dL — ABNORMAL HIGH (ref 700–1600)

## 2017-08-30 LAB — KAPPA/LAMBDA LIGHT CHAINS
Kappa free light chain: 31.2 mg/L — ABNORMAL HIGH (ref 3.3–19.4)
Kappa, lambda light chain ratio: 2.5 — ABNORMAL HIGH (ref 0.26–1.65)
LAMDA FREE LIGHT CHAINS: 12.5 mg/L (ref 5.7–26.3)

## 2017-08-30 LAB — PROTEIN ELECTROPHORESIS, SERUM
A/G Ratio: 1 (ref 0.7–1.7)
ALBUMIN ELP: 3.8 g/dL (ref 2.9–4.4)
Alpha-1-Globulin: 0.3 g/dL (ref 0.0–0.4)
Alpha-2-Globulin: 0.7 g/dL (ref 0.4–1.0)
Beta Globulin: 1.1 g/dL (ref 0.7–1.3)
GAMMA GLOBULIN: 1.7 g/dL (ref 0.4–1.8)
GLOBULIN, TOTAL: 3.8 g/dL (ref 2.2–3.9)
M-Spike, %: 0.6 g/dL — ABNORMAL HIGH
TOTAL PROTEIN ELP: 7.6 g/dL (ref 6.0–8.5)

## 2017-08-30 LAB — BETA 2 MICROGLOBULIN, SERUM: Beta-2 Microglobulin: 3.4 mg/L — ABNORMAL HIGH (ref 0.6–2.4)

## 2017-09-04 ENCOUNTER — Other Ambulatory Visit (HOSPITAL_COMMUNITY): Payer: Medicare Other

## 2017-09-04 ENCOUNTER — Inpatient Hospital Stay (HOSPITAL_COMMUNITY): Payer: Medicare Other

## 2017-09-04 ENCOUNTER — Ambulatory Visit (HOSPITAL_COMMUNITY): Payer: Medicare Other | Admitting: Internal Medicine

## 2017-09-04 ENCOUNTER — Other Ambulatory Visit: Payer: Self-pay

## 2017-09-04 ENCOUNTER — Inpatient Hospital Stay (HOSPITAL_BASED_OUTPATIENT_CLINIC_OR_DEPARTMENT_OTHER): Payer: Medicare Other | Admitting: Internal Medicine

## 2017-09-04 ENCOUNTER — Encounter (HOSPITAL_COMMUNITY): Payer: Self-pay | Admitting: Internal Medicine

## 2017-09-04 ENCOUNTER — Ambulatory Visit (HOSPITAL_COMMUNITY): Payer: Medicare Other

## 2017-09-04 VITALS — BP 140/82 | HR 73 | Temp 97.7°F | Resp 18 | Wt 296.6 lb

## 2017-09-04 DIAGNOSIS — Z79899 Other long term (current) drug therapy: Secondary | ICD-10-CM

## 2017-09-04 DIAGNOSIS — R7989 Other specified abnormal findings of blood chemistry: Secondary | ICD-10-CM | POA: Diagnosis not present

## 2017-09-04 DIAGNOSIS — I10 Essential (primary) hypertension: Secondary | ICD-10-CM | POA: Diagnosis not present

## 2017-09-04 DIAGNOSIS — C9001 Multiple myeloma in remission: Secondary | ICD-10-CM

## 2017-09-04 DIAGNOSIS — Z8249 Family history of ischemic heart disease and other diseases of the circulatory system: Secondary | ICD-10-CM

## 2017-09-04 DIAGNOSIS — C9000 Multiple myeloma not having achieved remission: Secondary | ICD-10-CM

## 2017-09-04 DIAGNOSIS — Z9221 Personal history of antineoplastic chemotherapy: Secondary | ICD-10-CM

## 2017-09-04 MED ORDER — ZOLEDRONIC ACID 4 MG/100ML IV SOLN
4.0000 mg | Freq: Once | INTRAVENOUS | Status: AC
  Administered 2017-09-04: 4 mg via INTRAVENOUS
  Filled 2017-09-04: qty 100

## 2017-09-04 MED ORDER — SODIUM CHLORIDE 0.9 % IV SOLN
INTRAVENOUS | Status: DC
  Administered 2017-09-04: 15:00:00 via INTRAVENOUS

## 2017-09-04 NOTE — Progress Notes (Signed)
Diagnosis Multiple myeloma, remission status unspecified ([place]) - Plan: CBC with Differential/Platelet, Comprehensive metabolic panel, Lactate dehydrogenase, Protein electrophoresis, serum, IgG, IgA, IgM, Kappa/lambda light chains, Beta 2 microglobulin, serum, CBC with Differential/Platelet, Comprehensive metabolic panel, Lactate dehydrogenase, Protein electrophoresis, serum, IgG, IgA, IgM, Beta 2 microglobulin, serum, Kappa/lambda light chains  Staging Cancer Staging Multiple myeloma (HCC) Staging form: Multiple Myeloma, AJCC 6th Edition - Clinical stage from 10/26/2014: Stage IIA - Signed by Baird Cancer, PA-C on 10/26/2014 - Pathologic: No stage assigned - Unsigned  CURRENT THERAPY: Maintenance Ixaxomib (Ninlaro) 3 mg po on Days 1, 8, & 15 beginning 04/17/16 & Zometa every 3 months  Assessment and Plan:  33.  MM.  62 year old female with IgG kappa myeloma with high risk features -17 p and  -13 q..  She was stage II by R- ISS and achieved a PR prior to transplant.  Pt is s/p stem cell transplant on 05/07/15.  She will continue Maintenance Ixaxomib (Ninlaro) 3 mg po on Days 1, 8, & 15.  Definition of progression would mean M spike greater than 0.74.   Pt is here today for follow-up.  Labs done 08/29/2017 reviewed and shows WBC 4.2 HB 10.9 plts 167,000.  Chemistries WNL with Cr 0.95, Calcium 9.8.  SPEP is 0.6 g/dl which is similar to labs done in 06/2017 when SPEP was 0.5 g/dl.  FLC ratio is stable at 2.5.   Quant IG stable.  She will RTC for repeat labs in 09/2017.  If SPEP continues to rise and becomes greater than 0.74 will refer back to Dr. Norma Fredrickson.  She should notify the office if she has any problems prior to her next visit.  She will continue Zometa every 3 months for 2 years. She will be seen every 3 months with Zometa.    2.  Elevated liver function tests.  Labs done 08/29/2017 show an elevated ALT of 61.  She reports she was on cholesterol medication.  I have discussed with her she may need  to hold the medication if LFTS worsen. Hep panel negative other that + Hep B SAB.   She will have repeat labs in 1 month.    3  HTN.  BP is 154/84.   Follow-up with PCP.   4.  Prophylaxis.  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    5.  Health maintenance.  Follow-up with GI and mammogram screening as recommended.     Interval History:  Multiple Myeloma s/p stem cell transplant on 05/07/15  Current Status:  Pt is seen today for follow-up.  She is here to go over labs and for evaluation prior to Zometa.      Multiple myeloma ([place])   08/07/2014 Imaging    Bone Survey- No lytic lesions are noted in the visualized skeleton.      09/03/2014 Bone Marrow Biopsy    NORMOCELLULAR BONE MARROW FOR AGE WITH PLASMA CELL NEOPLASM.  The plasma cell component is increased in the marrow representing an estimated 18% of all cells. Cytogenetics with 13q-, 17p- (high risk disease)      09/03/2014 Pathology Results    Cytogenetics with 13q-, 17p- (high risk disease)      09/23/2014 Initial Diagnosis    Multiple myeloma      09/30/2014 PET scan    No abnormal hypermetabolism in the neck, chest, abdomen or pelvis.      10/05/2014 - 03/22/2015 Chemotherapy    RVD  10/28/2014 Treatment Plan Change    Issues related to getting Revlimid in a timely fashion, therefore, she received her Revlimid on 9/21 resulting in a 12 day cycle this time instead of a 14 day cycle      11/02/2014 Imaging    CTA chest- No evidence for a large or central pulmonary embolism as described.  8 mm density along the right minor fissure could represent focal pleural thickening but indeterminate. If the patient is at high risk for bronchogenic carcinoma, follow-up c      11/23/2014 Miscellaneous    Zometa 4 mg IV monthly      04/07/2015 Bone Marrow Biopsy    Normocellular marrow with 2-4% clonal plasma cells by immunohistochemistry. FISH and cytogenetics were  normal [facility])        04/2015 Miscellaneous    PRETRANSPLANT EVALUATION:  Pulmonary function tests: FEV1 100.3% / DLCO 97.9%  Echocardiogram: Normal LV function with EF 60-65%       04/27/2015 Procedure    Stem cell mobilization with filgrastim and Mozobil [facility])      05/06/2015 Miscellaneous    BMT conditioning regimen with high-dose Melphalan given [facility] [name]); Day -1      05/07/2015 Bone Marrow Transplant    Outpatient autologous stem cell transplant [facility], [name]); Day 0      05/18/2015 Miscellaneous    WBC engraftment;  did not require platelet transfusion during her transplant process. Lowest platelet count 28,000       05/20/2015 Procedure    Tunneled catheter removed [facility])      09/08/2015 -  Chemotherapy    Velcade every 2 weeks      12/15/2015 Miscellaneous    Zometa re-instituted.       12/23/2015 Imaging    Bone density- BMD as determined from Forearm Radius 33% is 0.799 g/cm2 with a T-Score of 1.2. This patient is considered normal according to [facility]) criteria.      04/17/2016 Miscellaneous    Started maintenance with Ninlaro.      05/04/2016 Treatment Plan Change    Zometa switched to Xgeva injection monthly given difficult IV access.         Problem List Patient Active Problem List   Diagnosis Date Noted  . Claustrophobia [F40.240] 10/05/2014  . Multiple myeloma ([place]) [C90.00] 09/23/2014  . Normocytic hypochromic anemia [D50.9] 08/03/2014  . Leukopenia [D72.819] 08/03/2014  . Primary osteoarthritis of both knees [M17.0] 11/18/2013    Past Medical History Past Medical History:  Diagnosis Date  . Anemia   . Arthritis   . Cancer ([place])   . Claustrophobia 10/05/2014  . Hypertension   . Leukopenia 08/03/2014  . Normocytic hypochromic anemia 08/03/2014  . Renal disorder    stage 3     Past Surgical History Past Surgical History:  Procedure Laterality Date  . ABDOMINAL HYSTERECTOMY    .  CARDIAC SURGERY    . OTHER SURGICAL HISTORY     heart surgery as infant to "repair hole in heart"    Family History Family History  Problem Relation Age of Onset  . Cancer Mother   . Hypertension Mother   . Cancer Father   . Hypertension Father   . Cancer Maternal Grandmother   . Hypertension Sister   . Hypertension Brother      Social History  reports that she has never smoked. She has never used smokeless tobacco. She reports that she does not drink alcohol or use drugs.  Medications  Current Outpatient Medications:  .  acetaminophen (TYLENOL) 325 MG tablet, Take by mouth., Disp: , Rfl:  .  acyclovir (ZOVIRAX) 800 MG tablet, TAKE 1 TABLET BY MOUTH TWICE DAILY, Disp: 60 tablet, Rfl: 5 .  cholecalciferol (VITAMIN D) 1000 units tablet, Take 1,000 Units by mouth daily., Disp: , Rfl:  .  hydrochlorothiazide (HYDRODIURIL) 25 MG tablet, TAKE 1 TABLET BY MOUTH ONCE DAILY., Disp: 30 tablet, Rfl: 2 .  hydrocortisone 2.5 % ointment, , Disp: , Rfl:  .  ixazomib citrate (NINLARO) 3 MG capsule, Take 1 capsule (63m) by mouth once a week on days 1, 8 & 15 every 28 day cycle. Take on empty stomach 1 hr before or 2 hrs after food, Disp: 3 capsule, Rfl: 4 .  Multiple Vitamin (TAB-A-VITE) TABS, TAKE 1 TABLET BY MOUTH ONCE DAILY., Disp: 30 tablet, Rfl: 11 .  potassium chloride SA (K-DUR,KLOR-CON) 20 MEQ tablet, Take 1 tablet (20 mEq total) by mouth 2 (two) times daily., Disp: 60 tablet, Rfl: 0 .  PREVIDENT 5000 BOOSTER PLUS 1.1 % PSTE, , Disp: , Rfl:  .  zolendronic acid (ZOMETA) 4 MG/5ML injection, Inject 4 mg into the vein every 28 (twenty-eight) days., Disp: , Rfl:  No current facility-administered medications for this visit.   Facility-Administered Medications Ordered in Other Visits:  .  0.9 %  sodium chloride infusion, , Intravenous, Continuous, Mianna Iezzi, MD, Stopped at 09/04/17 1522 .  heparin lock flush 100 unit/mL, 500 Units, Intravenous, Once, [place], SLarene BeachK, MD .  sodium  chloride 0.9 % injection 10 mL, 10 mL, Intravenous, Once, Penland, SKelby Fam MD  Allergies Patient has no known allergies.  Review of Systems Review of Systems - Oncology ROS negative   Physical Exam  Vitals Wt Readings from Last 3 Encounters:  09/04/17 296 lb 9.6 oz (134.5 kg)  07/12/17 290 lb 8 oz (131.8 kg)  07/04/17 287 lb (130.2 kg)   Temp Readings from Last 3 Encounters:  09/04/17 97.7 F (36.5 C) (Oral)  07/12/17 98.7 F (37.1 C) (Oral)  06/05/17 97.9 F (36.6 C) (Oral)   BP Readings from Last 3 Encounters:  09/04/17 140/82  07/12/17 (!) 157/70  07/04/17 (!) 155/97   Pulse Readings from Last 3 Encounters:  09/04/17 73  07/12/17 79  07/04/17 74   Constitutional: Well-developed, well-nourished, and in no distress.   HENT: Head: Normocephalic and atraumatic.  Mouth/Throat: No oropharyngeal exudate. Mucosa moist. Eyes: Pupils are equal, round, and reactive to light. Conjunctivae are normal. No scleral icterus.  Neck: Normal range of motion. Neck supple. No JVD present.  Cardiovascular: Normal rate, regular rhythm and normal heart sounds.  Exam reveals no gallop and no friction rub.   No murmur heard. Pulmonary/Chest: Effort normal and breath sounds normal. No respiratory distress. No wheezes.No rales.  Abdominal: Soft. Bowel sounds are normal. No distension. There is no tenderness. There is no guarding.  Musculoskeletal: No edema or tenderness.  Lymphadenopathy:No cervical, axillary or supraclavicular adenopathy.  Neurological: Alert and oriented to person, place, and time. No cranial nerve deficit.  Skin: Skin is warm and dry. No rash noted. No erythema. No pallor.  Psychiatric: Affect and judgment normal.   Labs No visits with results within 3 Day(s) from this visit.  Latest known visit with results is:  Appointment on 08/29/2017  Component Date Value Ref Range Status  . WBC 08/29/2017 4.2  4.0 - 10.5 K/uL Final  . RBC 08/29/2017 3.55* 3.87 - 5.11 MIL/uL  Final  . Hemoglobin 08/29/2017  10.9* 12.0 - 15.0 g/dL Final  . HCT 08/29/2017 32.8* 36.0 - 46.0 % Final  . MCV 08/29/2017 92.4  78.0 - 100.0 fL Final  . MCH 08/29/2017 30.7  26.0 - 34.0 pg Final  . MCHC 08/29/2017 33.2  30.0 - 36.0 g/dL Final  . RDW 08/29/2017 14.3  11.5 - 15.5 % Final  . Platelets 08/29/2017 167  150 - 400 K/uL Final  . Neutrophils Relative % 08/29/2017 55  % Final  . Neutro Abs 08/29/2017 2.3  1.7 - 7.7 K/uL Final  . Lymphocytes Relative 08/29/2017 33  % Final  . Lymphs Abs 08/29/2017 1.4  0.7 - 4.0 K/uL Final  . Monocytes Relative 08/29/2017 11  % Final  . Monocytes Absolute 08/29/2017 0.5  0.1 - 1.0 K/uL Final  . Eosinophils Relative 08/29/2017 1  % Final  . Eosinophils Absolute 08/29/2017 0.1  0.0 - 0.7 K/uL Final  . Basophils Relative 08/29/2017 0  % Final  . Basophils Absolute 08/29/2017 0.0  0.0 - 0.1 K/uL Final   Performed at [facility], [address], [place], [place] [address]  . Sodium 08/29/2017 138  135 - 145 mmol/L Final  . Potassium 08/29/2017 4.0  3.5 - 5.1 mmol/L Final  . Chloride 08/29/2017 103  98 - 111 mmol/L Final  . CO2 08/29/2017 27  22 - 32 mmol/L Final  . Glucose, Bld 08/29/2017 97  70 - 99 mg/dL Final  . BUN 08/29/2017 24* 6 - 20 mg/dL Final  . Creatinine, Ser 08/29/2017 0.95  0.44 - 1.00 mg/dL Final  . Calcium 08/29/2017 9.8  8.9 - 10.3 mg/dL Final  . Total Protein 08/29/2017 8.0  6.5 - 8.1 g/dL Final  . Albumin 08/29/2017 4.2  3.5 - 5.0 g/dL Final  . AST 08/29/2017 31  15 - 41 U/L Final  . ALT 08/29/2017 61* 0 - 44 U/L Final  . Alkaline Phosphatase 08/29/2017 79  38 - 126 U/L Final  . Total Bilirubin 08/29/2017 0.7  0.3 - 1.2 mg/dL Final  . GFR calc non Af Amer 08/29/2017 >60  >60 mL/min Final  . GFR calc Af Amer 08/29/2017 >60  >60 mL/min Final   Comment: (NOTE) The eGFR has been calculated using the CKD EPI equation. This calculation has not been validated in all clinical situations. eGFR's persistently <60 mL/min signify  possible Chronic Kidney Disease.   Georgiann Hahn gap 08/29/2017 8  5 - 15 Final   Performed at [facility], [address] [place], [place] [address]  . LDH 08/29/2017 148  98 - 192 U/L Final   Performed at [facility], [address], [place], [place] [address]  . Total Protein ELP 08/29/2017 7.6  6.0 - 8.5 g/dL Final  . Albumin ELP 08/29/2017 3.8  2.9 - 4.4 g/dL Final  . Alpha-1-Globulin 08/29/2017 0.3  0.0 - 0.4 g/dL Final  . Alpha-2-Globulin 08/29/2017 0.7  0.4 - 1.0 g/dL Final  . Beta Globulin 08/29/2017 1.1  0.7 - 1.3 g/dL Final  . Gamma Globulin 08/29/2017 1.7  0.4 - 1.8 g/dL Final  . M-Spike, % 08/29/2017 0.6* Not Observed g/dL Final  . SPE Interp. 08/29/2017 Comment   Final   Comment: (NOTE) The SPE pattern demonstrates a single peak (M-spike) in the gamma region which may represent monoclonal protein. This peak may also be caused by circulating immune complexes, cryoglobulins, C-reactive protein, fibrinogen or hemolysis.  If clinically indicated, the presence of a monoclonal gammopathy may be confirmed by immuno- fixation, as well  as an evaluation of the urine for the presence of Bence-Jones protein. Performed At: [facility] [place], [place] [id number] Rush Farmer MD [id number]   . Comment 08/29/2017 Comment   Final   Comment: (NOTE) Protein electrophoresis scan will follow via computer, mail, or courier delivery.   Marland Kitchen GLOBULIN, TOTAL 08/29/2017 3.8  2.2 - 3.9 g/dL Corrected  . A/G Ratio 08/29/2017 1.0  0.7 - 1.7 Corrected  . Kappa free light chain 08/29/2017 31.2* 3.3 - 19.4 mg/L Final  . Lamda free light chains 08/29/2017 12.5  5.7 - 26.3 mg/L Final  . Kappa, lamda light chain ratio 08/29/2017 2.50* 0.26 - 1.65 Final   Comment: (NOTE) Performed At: [facility] [place], [place] [id number] Rush Farmer MD [id number]   . Beta-2 Microglobulin 08/29/2017 3.4* 0.6 - 2.4 mg/L Final   Comment: (NOTE) Siemens  Immulite 2000 Immunochemiluminometric assay (ICMA) Values obtained with different assay methods or kits cannot be used interchangeably. Results cannot be interpreted as absolute evidence of the presence or absence of malignant disease. Performed At: [facility] [place], [place] [id number] Rush Farmer MD [id number]   . IgG (Immunoglobin G), Serum 08/29/2017 1,739* 700 - 1,600 mg/dL Final  . IgA 08/29/2017 115  87 - 352 mg/dL Final  . IgM (Immunoglobulin M), Srm 08/29/2017 47  26 - 217 mg/dL Final   Comment: (NOTE) Performed At: [facility] [place], [place] [id number] Rush Farmer MD [id number]      Pathology Orders Placed This Encounter  Procedures  . CBC with Differential/Platelet    Standing Status:   Future    Standing Expiration Date:   09/05/2018  . Comprehensive metabolic panel    Standing Status:   Future    Standing Expiration Date:   09/05/2018  . Lactate dehydrogenase    Standing Status:   Future    Standing Expiration Date:   09/05/2018  . Protein electrophoresis, serum    Standing Status:   Future    Standing Expiration Date:   09/05/2018  . IgG, IgA, IgM    Standing Status:   Future    Standing Expiration Date:   09/05/2018  . Kappa/lambda light chains    Standing Status:   Future    Standing Expiration Date:   09/05/2018  . Beta 2 microglobulin, serum    Standing Status:   Future    Standing Expiration Date:   09/05/2018  . CBC with Differential/Platelet    Standing Status:   Future    Standing Expiration Date:   09/05/2018  . Comprehensive metabolic panel    Standing Status:   Future    Standing Expiration Date:   09/05/2018  . Lactate dehydrogenase    Standing Status:   Future    Standing Expiration Date:   09/05/2018  . Protein electrophoresis, serum    Standing Status:   Future    Standing Expiration Date:   09/05/2018  . IgG, IgA, IgM    Standing Status:   Future    Standing Expiration Date:    09/05/2018  . Beta 2 microglobulin, serum    Standing Status:   Future    Standing Expiration Date:   09/05/2018  . Kappa/lambda light chains    Standing Status:   Future    Standing Expiration Date:   09/05/2018       Zoila Shutter MD

## 2017-09-04 NOTE — Patient Instructions (Signed)
[facility] at [facility] Discharge Instructions  Received Zometa infusion today. Follow-up as scheduled. Call clinic for any questions or concerns   Thank you for choosing [facility] at [facility] to provide your oncology and hematology care.  To afford each patient quality time with our provider, please arrive at least 15 minutes before your scheduled appointment time.   If you have a lab appointment with the Cancer Center please come in thru the  Main Entrance and check in at the main information desk  You need to re-schedule your appointment should you arrive 10 or more minutes late.  We strive to give you quality time with our providers, and arriving late affects you and other patients whose appointments are after yours.  Also, if you no show three or more times for appointments you may be dismissed from the clinic at the providers discretion.     Again, thank you for choosing [facility].  Our hope is that these requests will decrease the amount of time that you wait before being seen by our physicians.       _____________________________________________________________  Should you have questions after your visit to [facility], please contact our office at [phone] between the hours of 8:00 a.m. and 4:30 p.m.  Voicemails left after 4:00 p.m. will not be returned until the following business day.  For prescription refill requests, have your pharmacy contact our office and allow 72 hours.    Cancer Center Support Programs:   > Cancer Support Group  2nd Tuesday of the month 1pm-2pm, Journey Room   

## 2017-09-04 NOTE — Patient Instructions (Signed)
[facility] at [facility] Discharge Instructions  Today you saw Dr. Higgs   Thank you for choosing [facility] at [facility] to provide your oncology and hematology care.  To afford each patient quality time with our provider, please arrive at least 15 minutes before your scheduled appointment time.   If you have a lab appointment with the Cancer Center please come in thru the  Main Entrance and check in at the main information desk  You need to re-schedule your appointment should you arrive 10 or more minutes late.  We strive to give you quality time with our providers, and arriving late affects you and other patients whose appointments are after yours.  Also, if you no show three or more times for appointments you may be dismissed from the clinic at the providers discretion.     Again, thank you for choosing [facility].  Our hope is that these requests will decrease the amount of time that you wait before being seen by our physicians.       _____________________________________________________________  Should you have questions after your visit to [facility], please contact our office at [phone] between the hours of 8:00 a.m. and 4:30 p.m.  Voicemails left after 4:00 p.m. will not be returned until the following business day.  For prescription refill requests, have your pharmacy contact our office and allow 72 hours.    Cancer Center Support Programs:   > Cancer Support Group  2nd Tuesday of the month 1pm-2pm, Journey Room   

## 2017-09-04 NOTE — Progress Notes (Signed)
Kelli Hurley Kelli Hurley tolerated Zometa infusion well without complaints or incident. Calcium 9.8 today and pt denied any tooth or jaw pain and no recent or future dental visits prior to administering this medication. Pt continues to take Calcium as prescribed without any issues. VSS upon discharge. Pt discharged self ambulatory in satisfactory condition

## 2017-09-09 ENCOUNTER — Other Ambulatory Visit (HOSPITAL_COMMUNITY): Payer: Self-pay | Admitting: Internal Medicine

## 2017-09-09 DIAGNOSIS — E876 Hypokalemia: Secondary | ICD-10-CM

## 2017-09-10 ENCOUNTER — Other Ambulatory Visit (HOSPITAL_COMMUNITY): Payer: Self-pay | Admitting: Hematology

## 2017-09-10 DIAGNOSIS — C9000 Multiple myeloma not having achieved remission: Secondary | ICD-10-CM

## 2017-09-21 ENCOUNTER — Other Ambulatory Visit (HOSPITAL_COMMUNITY): Payer: Self-pay | Admitting: *Deleted

## 2017-09-21 DIAGNOSIS — C9000 Multiple myeloma not having achieved remission: Secondary | ICD-10-CM

## 2017-09-21 MED ORDER — IXAZOMIB CITRATE 3 MG PO CAPS: ORAL_CAPSULE | ORAL | 4 refills | Status: DC

## 2017-10-05 ENCOUNTER — Inpatient Hospital Stay (HOSPITAL_COMMUNITY): Payer: Medicare Other | Attending: Hematology

## 2017-10-05 DIAGNOSIS — Z79899 Other long term (current) drug therapy: Secondary | ICD-10-CM | POA: Insufficient documentation

## 2017-10-05 DIAGNOSIS — R7989 Other specified abnormal findings of blood chemistry: Secondary | ICD-10-CM | POA: Diagnosis not present

## 2017-10-05 DIAGNOSIS — Z8249 Family history of ischemic heart disease and other diseases of the circulatory system: Secondary | ICD-10-CM | POA: Diagnosis not present

## 2017-10-05 DIAGNOSIS — Z9221 Personal history of antineoplastic chemotherapy: Secondary | ICD-10-CM | POA: Insufficient documentation

## 2017-10-05 DIAGNOSIS — C9001 Multiple myeloma in remission: Secondary | ICD-10-CM | POA: Insufficient documentation

## 2017-10-05 DIAGNOSIS — I10 Essential (primary) hypertension: Secondary | ICD-10-CM | POA: Insufficient documentation

## 2017-10-05 DIAGNOSIS — C9000 Multiple myeloma not having achieved remission: Secondary | ICD-10-CM

## 2017-10-05 LAB — LACTATE DEHYDROGENASE: LDH: 173 U/L (ref 98–192)

## 2017-10-05 LAB — COMPREHENSIVE METABOLIC PANEL
ALBUMIN: 4.2 g/dL (ref 3.5–5.0)
ALK PHOS: 54 U/L (ref 38–126)
ALT: 14 U/L (ref 0–44)
AST: 24 U/L (ref 15–41)
Anion gap: 9 (ref 5–15)
BILIRUBIN TOTAL: 0.7 mg/dL (ref 0.3–1.2)
BUN: 22 mg/dL — AB (ref 6–20)
CALCIUM: 9.9 mg/dL (ref 8.9–10.3)
CO2: 27 mmol/L (ref 22–32)
CREATININE: 0.96 mg/dL (ref 0.44–1.00)
Chloride: 103 mmol/L (ref 98–111)
GFR calc Af Amer: >60 mL/min (ref >60)
GFR calc non Af Amer: >60 mL/min (ref >60)
GLUCOSE: 91 mg/dL (ref 70–99)
Potassium: 4.1 mmol/L (ref 3.5–5.1)
Sodium: 139 mmol/L (ref 135–145)
TOTAL PROTEIN: 8.1 g/dL (ref 6.5–8.1)

## 2017-10-05 LAB — CBC WITH DIFFERENTIAL/PLATELET
BASOS ABS: 0 10*3/uL (ref 0.0–0.1)
BASOS PCT: 0 %
Eosinophils Absolute: 0 10*3/uL (ref 0.0–0.7)
Eosinophils Relative: 1 %
HEMATOCRIT: 34.1 % — AB (ref 36.0–46.0)
HEMOGLOBIN: 11.2 g/dL — AB (ref 12.0–15.0)
LYMPHS PCT: 34 %
Lymphs Abs: 1.6 10*3/uL (ref 0.7–4.0)
MCH: 30.5 pg (ref 26.0–34.0)
MCHC: 32.8 g/dL (ref 30.0–36.0)
MCV: 92.9 fL (ref 78.0–100.0)
MONOS PCT: 8 %
Monocytes Absolute: 0.4 10*3/uL (ref 0.1–1.0)
NEUTROS ABS: 2.6 10*3/uL (ref 1.7–7.7)
NEUTROS PCT: 57 %
Platelets: 188 10*3/uL (ref 150–400)
RBC: 3.67 MIL/uL — ABNORMAL LOW (ref 3.87–5.11)
RDW: 13.9 % (ref 11.5–15.5)
WBC: 4.6 10*3/uL (ref 4.0–10.5)

## 2017-10-06 LAB — IGG, IGA, IGM
IGA: 117 mg/dL (ref 87–352)
IGM (IMMUNOGLOBULIN M), SRM: 45 mg/dL (ref 26–217)
IgG (Immunoglobin G), Serum: 1721 mg/dL — ABNORMAL HIGH (ref 700–1600)

## 2017-10-07 LAB — BETA 2 MICROGLOBULIN, SERUM: BETA 2 MICROGLOBULIN: 3 mg/L — AB (ref 0.6–2.4)

## 2017-10-08 LAB — PROTEIN ELECTROPHORESIS, SERUM
A/G RATIO SPE: 1.1 (ref 0.7–1.7)
ALBUMIN ELP: 4 g/dL (ref 2.9–4.4)
ALPHA-1-GLOBULIN: 0.2 g/dL (ref 0.0–0.4)
Alpha-2-Globulin: 0.7 g/dL (ref 0.4–1.0)
Beta Globulin: 1 g/dL (ref 0.7–1.3)
Gamma Globulin: 1.6 g/dL (ref 0.4–1.8)
Globulin, Total: 3.6 g/dL (ref 2.2–3.9)
M-Spike, %: 0.6 g/dL — ABNORMAL HIGH
Total Protein ELP: 7.6 g/dL (ref 6.0–8.5)

## 2017-10-09 LAB — KAPPA/LAMBDA LIGHT CHAINS
KAPPA FREE LGHT CHN: 35.7 mg/L — AB (ref 3.3–19.4)
KAPPA, LAMDA LIGHT CHAIN RATIO: 2.81 — AB (ref 0.26–1.65)
LAMDA FREE LIGHT CHAINS: 12.7 mg/L (ref 5.7–26.3)

## 2017-10-30 ENCOUNTER — Other Ambulatory Visit (HOSPITAL_COMMUNITY): Payer: Self-pay | Admitting: *Deleted

## 2017-10-30 DIAGNOSIS — E876 Hypokalemia: Secondary | ICD-10-CM

## 2017-10-30 MED ORDER — POTASSIUM CHLORIDE CRYS ER 20 MEQ PO TBCR: 20.0000 meq | EXTENDED_RELEASE_TABLET | Freq: Two times a day (BID) | ORAL | 3 refills | Status: DC

## 2017-11-20 ENCOUNTER — Inpatient Hospital Stay (HOSPITAL_COMMUNITY): Payer: Medicare Other | Attending: Hematology

## 2017-11-20 DIAGNOSIS — C9000 Multiple myeloma not having achieved remission: Secondary | ICD-10-CM | POA: Insufficient documentation

## 2017-11-20 DIAGNOSIS — Z23 Encounter for immunization: Secondary | ICD-10-CM | POA: Diagnosis present

## 2017-11-20 DIAGNOSIS — R7989 Other specified abnormal findings of blood chemistry: Secondary | ICD-10-CM | POA: Insufficient documentation

## 2017-11-20 DIAGNOSIS — I10 Essential (primary) hypertension: Secondary | ICD-10-CM | POA: Insufficient documentation

## 2017-11-20 DIAGNOSIS — Z8249 Family history of ischemic heart disease and other diseases of the circulatory system: Secondary | ICD-10-CM | POA: Insufficient documentation

## 2017-11-20 DIAGNOSIS — Z9221 Personal history of antineoplastic chemotherapy: Secondary | ICD-10-CM | POA: Insufficient documentation

## 2017-11-20 DIAGNOSIS — Z79899 Other long term (current) drug therapy: Secondary | ICD-10-CM | POA: Insufficient documentation

## 2017-11-20 LAB — CBC WITH DIFFERENTIAL/PLATELET
Abs Immature Granulocytes: 0.01 10*3/uL (ref 0.00–0.07)
Basophils Absolute: 0 10*3/uL (ref 0.0–0.1)
Basophils Relative: 0 %
EOS PCT: 0 %
Eosinophils Absolute: 0 10*3/uL (ref 0.0–0.5)
HEMATOCRIT: 34.1 % — AB (ref 36.0–46.0)
HEMOGLOBIN: 11.1 g/dL — AB (ref 12.0–15.0)
Immature Granulocytes: 0 %
LYMPHS ABS: 1.3 10*3/uL (ref 0.7–4.0)
LYMPHS PCT: 30 %
MCH: 31.1 pg (ref 26.0–34.0)
MCHC: 32.6 g/dL (ref 30.0–36.0)
MCV: 95.5 fL (ref 80.0–100.0)
MONO ABS: 0.5 10*3/uL (ref 0.1–1.0)
Monocytes Relative: 12 %
Neutro Abs: 2.4 10*3/uL (ref 1.7–7.7)
Neutrophils Relative %: 58 %
Platelets: 150 10*3/uL (ref 150–400)
RBC: 3.57 MIL/uL — AB (ref 3.87–5.11)
RDW: 13.2 % (ref 11.5–15.5)
WBC: 4.2 10*3/uL (ref 4.0–10.5)
nRBC: 0 % (ref 0.0–0.2)

## 2017-11-20 LAB — COMPREHENSIVE METABOLIC PANEL
ALK PHOS: 52 U/L (ref 38–126)
ALT: 13 U/L (ref 0–44)
ANION GAP: 8 (ref 5–15)
AST: 23 U/L (ref 15–41)
Albumin: 4 g/dL (ref 3.5–5.0)
BILIRUBIN TOTAL: 0.6 mg/dL (ref 0.3–1.2)
BUN: 20 mg/dL (ref 6–20)
CO2: 26 mmol/L (ref 22–32)
Calcium: 9.4 mg/dL (ref 8.9–10.3)
Chloride: 103 mmol/L (ref 98–111)
Creatinine, Ser: 0.91 mg/dL (ref 0.44–1.00)
GFR calc Af Amer: >60 mL/min (ref >60)
GFR calc non Af Amer: >60 mL/min (ref >60)
GLUCOSE: 89 mg/dL (ref 70–99)
Potassium: 3.8 mmol/L (ref 3.5–5.1)
Sodium: 137 mmol/L (ref 135–145)
TOTAL PROTEIN: 7.8 g/dL (ref 6.5–8.1)

## 2017-11-20 LAB — LACTATE DEHYDROGENASE: LDH: 164 U/L (ref 98–192)

## 2017-11-21 LAB — PROTEIN ELECTROPHORESIS, SERUM
A/G Ratio: 1.1 (ref 0.7–1.7)
ALBUMIN ELP: 3.7 g/dL (ref 2.9–4.4)
ALPHA-1-GLOBULIN: 0.2 g/dL (ref 0.0–0.4)
ALPHA-2-GLOBULIN: 0.7 g/dL (ref 0.4–1.0)
BETA GLOBULIN: 1.1 g/dL (ref 0.7–1.3)
GAMMA GLOBULIN: 1.6 g/dL (ref 0.4–1.8)
Globulin, Total: 3.5 g/dL (ref 2.2–3.9)
M-Spike, %: 0.7 g/dL — ABNORMAL HIGH
Total Protein ELP: 7.2 g/dL (ref 6.0–8.5)

## 2017-11-21 LAB — BETA 2 MICROGLOBULIN, SERUM: BETA 2 MICROGLOBULIN: 2.9 mg/L — AB (ref 0.6–2.4)

## 2017-11-21 LAB — IGG, IGA, IGM
IgA: 107 mg/dL (ref 87–352)
IgG (Immunoglobin G), Serum: 1680 mg/dL — ABNORMAL HIGH (ref 700–1600)
IgM (Immunoglobulin M), Srm: 39 mg/dL (ref 26–217)

## 2017-11-21 LAB — KAPPA/LAMBDA LIGHT CHAINS
KAPPA FREE LGHT CHN: 33.7 mg/L — AB (ref 3.3–19.4)
KAPPA, LAMDA LIGHT CHAIN RATIO: 2.83 — AB (ref 0.26–1.65)
LAMDA FREE LIGHT CHAINS: 11.9 mg/L (ref 5.7–26.3)

## 2017-11-27 ENCOUNTER — Inpatient Hospital Stay (HOSPITAL_COMMUNITY): Payer: Medicare Other

## 2017-11-27 ENCOUNTER — Inpatient Hospital Stay (HOSPITAL_BASED_OUTPATIENT_CLINIC_OR_DEPARTMENT_OTHER): Payer: Medicare Other | Admitting: Internal Medicine

## 2017-11-27 ENCOUNTER — Other Ambulatory Visit: Payer: Self-pay

## 2017-11-27 ENCOUNTER — Encounter (HOSPITAL_COMMUNITY): Payer: Self-pay | Admitting: Internal Medicine

## 2017-11-27 VITALS — BP 129/79 | HR 90 | Temp 98.1°F | Resp 18 | Wt 315.0 lb

## 2017-11-27 VITALS — BP 125/77 | HR 75 | Temp 98.2°F | Resp 18

## 2017-11-27 DIAGNOSIS — Z23 Encounter for immunization: Secondary | ICD-10-CM | POA: Diagnosis not present

## 2017-11-27 DIAGNOSIS — I10 Essential (primary) hypertension: Secondary | ICD-10-CM | POA: Diagnosis not present

## 2017-11-27 DIAGNOSIS — C9001 Multiple myeloma in remission: Secondary | ICD-10-CM

## 2017-11-27 DIAGNOSIS — Z9221 Personal history of antineoplastic chemotherapy: Secondary | ICD-10-CM

## 2017-11-27 DIAGNOSIS — R7989 Other specified abnormal findings of blood chemistry: Secondary | ICD-10-CM

## 2017-11-27 DIAGNOSIS — Z8249 Family history of ischemic heart disease and other diseases of the circulatory system: Secondary | ICD-10-CM

## 2017-11-27 DIAGNOSIS — Z79899 Other long term (current) drug therapy: Secondary | ICD-10-CM

## 2017-11-27 DIAGNOSIS — C9000 Multiple myeloma not having achieved remission: Secondary | ICD-10-CM | POA: Diagnosis not present

## 2017-11-27 MED ORDER — HYDROCHLOROTHIAZIDE 25 MG PO TABS: 25.0000 mg | ORAL_TABLET | Freq: Every day | ORAL | 0 refills | Status: DC

## 2017-11-27 MED ORDER — INFLUENZA VAC SPLIT QUAD 0.5 ML IM SUSY
0.5000 mL | PREFILLED_SYRINGE | Freq: Once | INTRAMUSCULAR | Status: AC
  Administered 2017-11-27: 0.5 mL via INTRAMUSCULAR

## 2017-11-27 MED ORDER — INFLUENZA VAC SPLIT QUAD 0.5 ML IM SUSY
PREFILLED_SYRINGE | INTRAMUSCULAR | Status: AC
  Filled 2017-11-27: qty 0.5

## 2017-11-27 MED ORDER — SODIUM CHLORIDE 0.9 % IV SOLN
INTRAVENOUS | Status: DC
  Administered 2017-11-27: 14:00:00 via INTRAVENOUS

## 2017-11-27 MED ORDER — ZOLEDRONIC ACID 4 MG/100ML IV SOLN
4.0000 mg | Freq: Once | INTRAVENOUS | Status: AC
  Administered 2017-11-27: 4 mg via INTRAVENOUS
  Filled 2017-11-27: qty 100

## 2017-11-27 MED ORDER — ACYCLOVIR 800 MG PO TABS: 800.0000 mg | ORAL_TABLET | Freq: Two times a day (BID) | ORAL | 5 refills | Status: DC

## 2017-11-27 NOTE — Progress Notes (Signed)
Diagnosis Multiple myeloma in remission ([place]) - Plan: CBC with Differential/Platelet, Comprehensive metabolic panel, Lactate dehydrogenase, IgG, IgA, IgM, Protein electrophoresis, serum, Kappa/lambda light chains  Multiple myeloma, remission status unspecified (HCC) - Plan: hydrochlorothiazide (HYDRODIURIL) 25 MG tablet  Multiple myeloma not having achieved remission (HCC) - Plan: acyclovir (ZOVIRAX) 800 MG tablet  High risk medications (not anticoagulants) long-term use - Plan: acyclovir (ZOVIRAX) 800 MG tablet  Staging Cancer Staging Multiple myeloma ([place]) Staging form: Multiple Myeloma, AJCC 6th Edition - Clinical stage from 10/26/2014: Stage IIA - Signed by Baird Cancer, PA-C on 10/26/2014 - Pathologic: No stage assigned - Unsigned  CURRENT THERAPY: Maintenance Ixaxomib (Ninlaro) 3 mg po on Days 1, 8, & 15 beginning 04/17/16 & Zometa every 3 months  Assessment and Plan:  48.  MM.  62 year old female with IgG kappa myeloma with high risk features -17 p and  -13 q..  She was stage II by R- ISS and achieved a PR prior to transplant.  Pt is s/p stem cell transplant on 05/07/15.  She will continue Maintenance Ixaxomib (Ninlaro) 3 mg po on Days 1, 8, & 15.  Definition of progression would mean M spike greater than 0.74.   Labs done 11/20/2017 reviewed and showed WBC 4.2 HB 11.1 plts 150,000.  Chemistries WNL wit K+ 3.8,  Cr ).91 and normal LFTs.  SPEP stable at 0.7 g/dl.  Normal Quant IG.  FLC ratio 2.83.  Pt will have repeat labs in 01/2018 for ongoing follow-up.  She will continue Zometa every 3 months.  Pt should follow-up with Dr. Norma Fredrickson as directed.    2.  Elevated liver function tests.  LFTs WNL on labs done 11/20/2017.  Will repeat labs on RTC.   3  HTN.  BP is 125/77.  Pt is requesting refills on HCTZ.  RX for # 30 with no refills sent to pharmacy.  Pt is referred to PCP to establish for management.    4.  Prophylaxis.  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  Rx sent.    5.  Health maintenance.  Follow-up with GI and mammogram screening as recommended.    25 minutes spent with more than 50% spent in counseling and coordination of care.     Interval History:  Multiple Myeloma s/p stem cell transplant on 05/07/15  Current Status:  Pt is seen today for follow-up.  She is here to go over labs and for evaluation prior to Zometa.  She is requesting refills on HCTZ and ACV.        Multiple myeloma ([place])   08/07/2014 Imaging    Bone Survey- No lytic lesions are noted in the visualized skeleton.    09/03/2014 Bone Marrow Biopsy    NORMOCELLULAR BONE MARROW FOR AGE WITH PLASMA CELL NEOPLASM.  The plasma cell component is increased in the marrow representing an estimated 18% of all cells. Cytogenetics with 13q-, 17p- (high risk disease)    09/03/2014 Pathology Results    Cytogenetics with 13q-, 17p- (high risk disease)    09/23/2014 Initial Diagnosis    Multiple myeloma    09/30/2014 PET scan    No abnormal hypermetabolism in the neck, chest, abdomen or pelvis.    10/05/2014 - 03/22/2015 Chemotherapy    RVD    10/28/2014 Treatment Plan Change    Issues related to getting Revlimid in a timely fashion, therefore, she received her Revlimid on 9/21 resulting in a 12 day  cycle this time instead of a 14 day cycle    11/02/2014 Imaging    CTA chest- No evidence for a large or central pulmonary embolism as described.  8 mm density along the right minor fissure could represent focal pleural thickening but indeterminate. If the patient is at high risk for bronchogenic carcinoma, follow-up c    11/23/2014 Miscellaneous    Zometa 4 mg IV monthly    04/07/2015 Bone Marrow Biopsy    Normocellular marrow with 2-4% clonal plasma cells by immunohistochemistry. FISH and cytogenetics were normal [facility])      04/2015 Miscellaneous    PRETRANSPLANT EVALUATION:  Pulmonary function tests: FEV1 100.3% / DLCO 97.9%   Echocardiogram: Normal LV function with EF 60-65%     04/27/2015 Procedure    Stem cell mobilization with filgrastim and Mozobil [facility])    05/06/2015 Miscellaneous    BMT conditioning regimen with high-dose Melphalan given [facility] [name]); Day -1    05/07/2015 Bone Marrow Transplant    Outpatient autologous stem cell transplant [facility], [name]); Day 0    05/18/2015 Miscellaneous    WBC engraftment;  did not require platelet transfusion during her transplant process. Lowest platelet count 28,000     05/20/2015 Procedure    Tunneled catheter removed [facility])    09/08/2015 -  Chemotherapy    Velcade every 2 weeks    12/15/2015 Miscellaneous    Zometa re-instituted.     12/23/2015 Imaging    Bone density- BMD as determined from Forearm Radius 33% is 0.799 g/cm2 with a T-Score of 1.2. This patient is considered normal according to [facility]) criteria.    04/17/2016 Miscellaneous    Started maintenance with Ninlaro.    05/04/2016 Treatment Plan Change    Zometa switched to Xgeva injection monthly given difficult IV access.       Problem List Patient Active Problem List   Diagnosis Date Noted  . Claustrophobia [F40.240] 10/05/2014  . Multiple myeloma ([place]) [C90.00] 09/23/2014  . Normocytic hypochromic anemia [D50.9] 08/03/2014  . Leukopenia [D72.819] 08/03/2014  . Primary osteoarthritis of both knees [M17.0] 11/18/2013    Past Medical History Past Medical History:  Diagnosis Date  . Anemia   . Arthritis   . Cancer ([place])   . Claustrophobia 10/05/2014  . Hypertension   . Leukopenia 08/03/2014  . Normocytic hypochromic anemia 08/03/2014  . Renal disorder    stage 3     Past Surgical History Past Surgical History:  Procedure Laterality Date  . ABDOMINAL HYSTERECTOMY    . CARDIAC SURGERY    . OTHER SURGICAL HISTORY     heart surgery as infant to "repair hole in heart"    Family History Family History  Problem Relation Age of Onset   . Cancer Mother   . Hypertension Mother   . Cancer Father   . Hypertension Father   . Cancer Maternal Grandmother   . Hypertension Sister   . Hypertension Brother      Social History  reports that she has never smoked. She has never used smokeless tobacco. She reports that she does not drink alcohol or use drugs.  Medications  Current Outpatient Medications:  .  acetaminophen (TYLENOL) 325 MG tablet, Take by mouth., Disp: , Rfl:  .  acyclovir (ZOVIRAX) 800 MG tablet, Take 1 tablet (800 mg total) by mouth 2 (two) times daily., Disp: 60 tablet, Rfl: 5 .  cholecalciferol (VITAMIN D) 1000 units tablet, Take 1,000 Units by mouth daily., Disp: ,  Rfl:  .  hydrochlorothiazide (HYDRODIURIL) 25 MG tablet, Take 1 tablet (25 mg total) by mouth daily., Disp: 30 tablet, Rfl: 0 .  hydrocortisone 2.5 % ointment, , Disp: , Rfl:  .  ixazomib citrate (NINLARO) 3 MG capsule, Take 1 capsule (42m) by mouth once a week on days 1, 8 & 15 every 28 day cycle. Take on empty stomach 1 hr before or 2 hrs after food, Disp: 3 capsule, Rfl: 4 .  Multiple Vitamin (TAB-A-VITE) TABS, TAKE 1 TABLET BY MOUTH ONCE DAILY., Disp: 30 tablet, Rfl: 11 .  potassium chloride SA (K-DUR,KLOR-CON) 20 MEQ tablet, Take 1 tablet (20 mEq total) by mouth 2 (two) times daily., Disp: 60 tablet, Rfl: 3 .  PREVIDENT 5000 BOOSTER PLUS 1.1 % PSTE, , Disp: , Rfl:  .  zolendronic acid (ZOMETA) 4 MG/5ML injection, Inject 4 mg into the vein every 28 (twenty-eight) days., Disp: , Rfl:  No current facility-administered medications for this visit.   Facility-Administered Medications Ordered in Other Visits:  .  0.9 %  sodium chloride infusion, , Intravenous, Continuous, Lockamy, Randi L, NP-C, Stopped at 11/27/17 1408 .  heparin lock flush 100 unit/mL, 500 Units, Intravenous, Once, Penland, Shannon K, MD .  sodium chloride 0.9 % injection 10 mL, 10 mL, Intravenous, Once, Penland, SKelby Fam MD  Allergies Patient has no known allergies.  Review  of Systems Review of Systems - Oncology ROS as per HPI otherwise 12 point ROS is negative.   Physical Exam  Vitals Wt Readings from Last 3 Encounters:  11/27/17 (!) 315 lb (142.9 kg)  09/04/17 296 lb 9.6 oz (134.5 kg)  07/12/17 290 lb 8 oz (131.8 kg)   Temp Readings from Last 3 Encounters:  11/27/17 98.2 F (36.8 C) (Oral)  11/27/17 98.1 F (36.7 C) (Oral)  09/04/17 97.7 F (36.5 C) (Oral)   BP Readings from Last 3 Encounters:  11/27/17 125/77  11/27/17 129/79  09/04/17 140/82   Pulse Readings from Last 3 Encounters:  11/27/17 75  11/27/17 90  09/04/17 73   Constitutional: Well-developed, well-nourished, and in no distress.   HENT: Head: Normocephalic and atraumatic.  Mouth/Throat: No oropharyngeal exudate. Mucosa moist. Eyes: Pupils are equal, round, and reactive to light. Conjunctivae are normal. No scleral icterus.  Neck: Normal range of motion. Neck supple. No JVD present.  Cardiovascular: Normal rate, regular rhythm and normal heart sounds.  Exam reveals no gallop and no friction rub.   No murmur heard. Pulmonary/Chest: Effort normal and breath sounds normal. No respiratory distress. No wheezes.No rales.  Abdominal: Soft. Bowel sounds are normal. No distension. There is no tenderness. There is no guarding.  Musculoskeletal: No edema or tenderness.  Lymphadenopathy: No cervical, axillary or supraclavicular adenopathy.  Neurological: Alert and oriented to person, place, and time. No cranial nerve deficit.  Skin: Skin is warm and dry. No rash noted. No erythema. No pallor.  Psychiatric: Affect and judgment normal.   Labs No visits with results within 3 Day(s) from this visit.  Latest known visit with results is:  Appointment on 11/20/2017  Component Date Value Ref Range Status  . WBC 11/20/2017 4.2  4.0 - 10.5 K/uL Final  . RBC 11/20/2017 3.57* 3.87 - 5.11 MIL/uL Final  . Hemoglobin 11/20/2017 11.1* 12.0 - 15.0 g/dL Final  . HCT 11/20/2017 34.1* 36.0 - 46.0 %  Final  . MCV 11/20/2017 95.5  80.0 - 100.0 fL Final  . MCH 11/20/2017 31.1  26.0 - 34.0 pg Final  . MCHC 11/20/2017  32.6  30.0 - 36.0 g/dL Final  . RDW 11/20/2017 13.2  11.5 - 15.5 % Final  . Platelets 11/20/2017 150  150 - 400 K/uL Final  . nRBC 11/20/2017 0.0  0.0 - 0.2 % Final  . Neutrophils Relative % 11/20/2017 58  % Final  . Neutro Abs 11/20/2017 2.4  1.7 - 7.7 K/uL Final  . Lymphocytes Relative 11/20/2017 30  % Final  . Lymphs Abs 11/20/2017 1.3  0.7 - 4.0 K/uL Final  . Monocytes Relative 11/20/2017 12  % Final  . Monocytes Absolute 11/20/2017 0.5  0.1 - 1.0 K/uL Final  . Eosinophils Relative 11/20/2017 0  % Final  . Eosinophils Absolute 11/20/2017 0.0  0.0 - 0.5 K/uL Final  . Basophils Relative 11/20/2017 0  % Final  . Basophils Absolute 11/20/2017 0.0  0.0 - 0.1 K/uL Final  . Immature Granulocytes 11/20/2017 0  % Final  . Abs Immature Granulocytes 11/20/2017 0.01  0.00 - 0.07 K/uL Final   Performed at [facility], [address], [place], [place] [address]  . Sodium 11/20/2017 137  135 - 145 mmol/L Final  . Potassium 11/20/2017 3.8  3.5 - 5.1 mmol/L Final  . Chloride 11/20/2017 103  98 - 111 mmol/L Final  . CO2 11/20/2017 26  22 - 32 mmol/L Final  . Glucose, Bld 11/20/2017 89  70 - 99 mg/dL Final  . BUN 11/20/2017 20  6 - 20 mg/dL Final  . Creatinine, Ser 11/20/2017 0.91  0.44 - 1.00 mg/dL Final  . Calcium 11/20/2017 9.4  8.9 - 10.3 mg/dL Final  . Total Protein 11/20/2017 7.8  6.5 - 8.1 g/dL Final  . Albumin 11/20/2017 4.0  3.5 - 5.0 g/dL Final  . AST 11/20/2017 23  15 - 41 U/L Final  . ALT 11/20/2017 13  0 - 44 U/L Final  . Alkaline Phosphatase 11/20/2017 52  38 - 126 U/L Final  . Total Bilirubin 11/20/2017 0.6  0.3 - 1.2 mg/dL Final  . GFR calc non Af Amer 11/20/2017 >60  >60 mL/min Final  . GFR calc Af Amer 11/20/2017 >60  >60 mL/min Final   Comment: (NOTE) The eGFR has been calculated using the CKD EPI equation. This calculation has not been validated in all  clinical situations. eGFR's persistently <60 mL/min signify possible Chronic Kidney Disease.   Georgiann Hahn gap 11/20/2017 8  5 - 15 Final   Performed at [facility], [address], [place], [place] [address]  . LDH 11/20/2017 164  98 - 192 U/L Final   Performed at [facility], [address], [place], [place] [address]  . Total Protein ELP 11/20/2017 7.2  6.0 - 8.5 g/dL Final  . Albumin ELP 11/20/2017 3.7  2.9 - 4.4 g/dL Final  . Alpha-1-Globulin 11/20/2017 0.2  0.0 - 0.4 g/dL Final  . Alpha-2-Globulin 11/20/2017 0.7  0.4 - 1.0 g/dL Final  . Beta Globulin 11/20/2017 1.1  0.7 - 1.3 g/dL Final  . Gamma Globulin 11/20/2017 1.6  0.4 - 1.8 g/dL Final  . M-Spike, % 11/20/2017 0.7* Not Observed g/dL Final  . SPE Interp. 11/20/2017 Comment   Final   Comment: (NOTE) The SPE pattern demonstrates a single peak (M-spike) in the gamma region which may represent monoclonal protein. This peak may also be caused by circulating immune complexes, cryoglobulins, C-reactive protein, fibrinogen or hemolysis.  If clinically indicated, the presence of a monoclonal gammopathy may be confirmed by immuno- fixation, as well as an evaluation of the urine for  the presence of Bence-Jones protein. Performed At: [facility] [place], [place] [id number] Rush Farmer MD FG:[id number]   . Comment 11/20/2017 Comment   Final   Comment: (NOTE) Protein electrophoresis scan will follow via computer, mail, or courier delivery.   Marland Kitchen GLOBULIN, TOTAL 11/20/2017 3.5  2.2 - 3.9 g/dL Corrected  . A/G Ratio 11/20/2017 1.1  0.7 - 1.7 Corrected  . IgG (Immunoglobin G), Serum 11/20/2017 1,680* 700 - 1,600 mg/dL Final  . IgA 11/20/2017 107  87 - 352 mg/dL Final  . IgM (Immunoglobulin M), Srm 11/20/2017 39  26 - 217 mg/dL Final   Comment: (NOTE) Performed At: [facility] [place], [place] [id number] Rush Farmer MD [id number]   . Beta-2 Microglobulin 11/20/2017 2.9* 0.6 - 2.4  mg/L Final   Comment: (NOTE) Siemens Immulite 2000 Immunochemiluminometric assay (ICMA) Values obtained with different assay methods or kits cannot be used interchangeably. Results cannot be interpreted as absolute evidence of the presence or absence of malignant disease. Performed At: [facility] [place], [place] [id number] Rush Farmer MD PO:[id number]   . Kappa free light chain 11/20/2017 33.7* 3.3 - 19.4 mg/L Final  . Lamda free light chains 11/20/2017 11.9  5.7 - 26.3 mg/L Final  . Kappa, lamda light chain ratio 11/20/2017 2.83* 0.26 - 1.65 Final   Comment: (NOTE) Performed At: [facility], [place] [id number] Rush Farmer MD KQ:[id number]      Pathology Orders Placed This Encounter  Procedures  . CBC with Differential/Platelet    Standing Status:   Future    Standing Expiration Date:   11/28/2018  . Comprehensive metabolic panel    Standing Status:   Future    Standing Expiration Date:   11/28/2018  . Lactate dehydrogenase    Standing Status:   Future    Standing Expiration Date:   11/28/2018  . IgG, IgA, IgM    Standing Status:   Future    Standing Expiration Date:   11/28/2018  . Protein electrophoresis, serum    Standing Status:   Future    Standing Expiration Date:   11/28/2018  . Kappa/lambda light chains    Standing Status:   Future    Standing Expiration Date:   11/28/2018       Zoila Shutter MD

## 2017-11-27 NOTE — Progress Notes (Signed)
[name] tolerated Zometa and influenza shot without incident or complaint. VSS upon completion of treatment. Discharged self ambulatory in satisfactory condition.

## 2017-11-27 NOTE — Patient Instructions (Signed)
[facility] at [facility] _______________________________________________________________  Thank you for choosing [facility] at [facility] to provide your oncology and hematology care.  To afford each patient quality time with our providers, please arrive at least 15 minutes before your scheduled appointment.  You need to re-schedule your appointment if you arrive 10 or more minutes late.  We strive to give you quality time with our providers, and arriving late affects you and other patients whose appointments are after yours.  Also, if you no show three or more times for appointments you may be dismissed from the clinic.  Again, thank you for choosing [facility] at [facility]. Our hope is that these requests will allow you access to exceptional care and in a timely manner. _______________________________________________________________  If you have questions after your visit, please contact our office at [phone] between the hours of 8:30 a.m. and 5:00 p.m. Voicemails left after 4:30 p.m. will not be returned until the following business day. _______________________________________________________________  For prescription refill requests, have your pharmacy contact our office. _______________________________________________________________  Recommendations made by the consultant and any test results will be sent to your referring physician. _______________________________________________________________ 

## 2018-01-02 ENCOUNTER — Ambulatory Visit (INDEPENDENT_AMBULATORY_CARE_PROVIDER_SITE_OTHER): Payer: Medicare Other

## 2018-01-02 ENCOUNTER — Ambulatory Visit (INDEPENDENT_AMBULATORY_CARE_PROVIDER_SITE_OTHER): Payer: Medicare Other | Admitting: Orthopedic Surgery

## 2018-01-02 ENCOUNTER — Encounter: Payer: Self-pay | Admitting: Orthopedic Surgery

## 2018-01-02 VITALS — BP 116/74 | HR 86 | Ht 62.0 in | Wt 319.0 lb

## 2018-01-02 DIAGNOSIS — M1711 Unilateral primary osteoarthritis, right knee: Secondary | ICD-10-CM

## 2018-01-02 DIAGNOSIS — M1710 Unilateral primary osteoarthritis, unspecified knee: Secondary | ICD-10-CM | POA: Diagnosis not present

## 2018-01-02 DIAGNOSIS — Z6841 Body Mass Index (BMI) 40.0 and over, adult: Secondary | ICD-10-CM

## 2018-01-02 DIAGNOSIS — M170 Bilateral primary osteoarthritis of knee: Secondary | ICD-10-CM | POA: Diagnosis not present

## 2018-01-02 MED ORDER — DICLOFENAC SODIUM 75 MG PO TBEC: 75.0000 mg | DELAYED_RELEASE_TABLET | Freq: Two times a day (BID) | ORAL | 2 refills | Status: DC

## 2018-01-02 NOTE — Progress Notes (Signed)
Progress Note   Patient ID: Kelli Hurley, female   DOB: 1955-05-16, 62 y.o.   MRN: [id number]   Chief Complaint  Patient presents with  . Knee Pain    bilateral left greater than right     HPI The patient presents for evaluation of bilateral knee pain   I saw her last year her BMI is well over 50  Location bilateral knee pain Duration greater than a year Quality dull deep ache Severity  7/10 Associated with occult he walking difficulty flexing and bending difficulty getting out of a chair  She is not taking any medication right now for knee pain  Review of Systems  Respiratory: Negative for shortness of breath.   Cardiovascular: Negative for chest pain.  Musculoskeletal: Positive for joint pain.   Current Meds  Medication Sig  . acetaminophen (TYLENOL) 325 MG tablet Take by mouth.  Marland Kitchen acyclovir (ZOVIRAX) 800 MG tablet Take 1 tablet (800 mg total) by mouth 2 (two) times daily.  . cholecalciferol (VITAMIN D) 1000 units tablet Take 1,000 Units by mouth daily.  . hydrochlorothiazide (HYDRODIURIL) 25 MG tablet Take 1 tablet (25 mg total) by mouth daily.  . hydrocortisone 2.5 % ointment   . ixazomib citrate (NINLARO) 3 MG capsule Take 1 capsule (3mg ) by mouth once a week on days 1, 8 & 15 every 28 day cycle. Take on empty stomach 1 hr before or 2 hrs after food  . Multiple Vitamin (TAB-A-VITE) TABS TAKE 1 TABLET BY MOUTH ONCE DAILY.  Marland Kitchen potassium chloride SA (K-DUR,KLOR-CON) 20 MEQ tablet Take 1 tablet (20 mEq total) by mouth 2 (two) times daily.  Marland Kitchen PREVIDENT 5000 BOOSTER PLUS 1.1 % PSTE   . zolendronic acid (ZOMETA) 4 MG/5ML injection Inject 4 mg into the vein every 28 (twenty-eight) days.    Past Medical History:  Diagnosis Date  . Anemia   . Arthritis   . Cancer ([place])   . Claustrophobia 10/05/2014  . Hypertension   . Leukopenia 08/03/2014  . Normocytic hypochromic anemia 08/03/2014  . Renal disorder    stage 3      No Known Allergies   BP 116/74   Pulse 86    Ht 5\' 2"  (1.575 m)   Wt (!) 319 lb (144.7 kg)   BMI 58.35 kg/m    Physical Exam General appearance normal Oriented x3 normal Mood pleasant affect normal Gait right lower extremity antalgic gait favors the right leg with a limp and short stride length cadence is also diminished  Ortho Exam Right knee Tenderness to palpation medial compartment Does not come to full extension flexion is limited by knee circumference and thigh circumference measures 105 degrees Ligaments appears stable with no pseudolaxity Skin clean Distal pulses are intact no edema extremities are warm to touch sensation is normal  Left knee Again we see tenderness to palpation on the medial compartment with lack of full extension knee flexion 105 limited by thigh circumference No laxity no pseudolaxity Skin clean Distal leg shows no edema sensation normal     MEDICAL DECISION MAKING   Imaging:  X-ray right knee x-ray previously from the left knee see the dictated report  The right knee show severe arthritis secondary bone changes as well as moderately severe varus alignment  Left knee very similar   Encounter Diagnoses  Name Primary?  Marland Kitchen Arthritis of knee   . Morbid obesity ([place])   . Primary osteoarthritis of both knees Yes  . Body mass index 50.0-59.9, adult ([place])  PLAN: (RX., injection, surgery,frx,mri/ct, XR 2 body ares) 2 injections were given and I started her on diclofenac not a candidate for knee replacement because of her obesity   Procedure note left knee injection verbal consent was obtained to inject left knee joint  Timeout was completed to confirm the site of injection  The medications used were 40 mg of Depo-Medrol and 1% lidocaine 3 cc  Anesthesia was provided by ethyl chloride and the skin was prepped with alcohol.  After cleaning the skin with alcohol a 20-gauge needle was used to inject the left knee joint. There were no complications. A sterile bandage was  applied.   Procedure note right knee injection verbal consent was obtained to inject right knee joint  Timeout was completed to confirm the site of injection  The medications used were 40 mg of Depo-Medrol and 1% lidocaine 3 cc  Anesthesia was provided by ethyl chloride and the skin was prepped with alcohol.  After cleaning the skin with alcohol a 20-gauge needle was used to inject the right knee joint. There were no complications. A sterile bandage was applied.   Meds ordered this encounter  Medications  . diclofenac (VOLTAREN) 75 MG EC tablet    Sig: Take 1 tablet (75 mg total) by mouth 2 (two) times daily with a meal.    Dispense:  60 tablet    Refill:  2   10:36 AM 01/02/2018

## 2018-01-02 NOTE — Patient Instructions (Signed)
Continue to work on weight loss  Start diclofenac 75 mg twice a day  Follow-up as needed

## 2018-01-23 ENCOUNTER — Other Ambulatory Visit: Payer: Self-pay

## 2018-01-23 ENCOUNTER — Inpatient Hospital Stay (HOSPITAL_COMMUNITY): Payer: Medicare Other | Attending: Hematology | Admitting: Internal Medicine

## 2018-01-23 ENCOUNTER — Inpatient Hospital Stay (HOSPITAL_COMMUNITY): Payer: Medicare Other

## 2018-01-23 ENCOUNTER — Encounter (HOSPITAL_COMMUNITY): Payer: Self-pay | Admitting: Internal Medicine

## 2018-01-23 VITALS — BP 116/55 | HR 70 | Temp 97.9°F | Resp 14 | Wt 323.7 lb

## 2018-01-23 DIAGNOSIS — B009 Herpesviral infection, unspecified: Secondary | ICD-10-CM

## 2018-01-23 DIAGNOSIS — C9000 Multiple myeloma not having achieved remission: Secondary | ICD-10-CM | POA: Diagnosis present

## 2018-01-23 DIAGNOSIS — Z9221 Personal history of antineoplastic chemotherapy: Secondary | ICD-10-CM | POA: Diagnosis not present

## 2018-01-23 DIAGNOSIS — Z79899 Other long term (current) drug therapy: Secondary | ICD-10-CM | POA: Diagnosis not present

## 2018-01-23 DIAGNOSIS — C9001 Multiple myeloma in remission: Secondary | ICD-10-CM

## 2018-01-23 DIAGNOSIS — I10 Essential (primary) hypertension: Secondary | ICD-10-CM

## 2018-01-23 LAB — CBC WITH DIFFERENTIAL/PLATELET
ABS IMMATURE GRANULOCYTES: 0.01 10*3/uL (ref 0.00–0.07)
Basophils Absolute: 0 10*3/uL (ref 0.0–0.1)
Basophils Relative: 0 %
Eosinophils Absolute: 0 10*3/uL (ref 0.0–0.5)
Eosinophils Relative: 1 %
HEMATOCRIT: 35.9 % — AB (ref 36.0–46.0)
HEMOGLOBIN: 11.2 g/dL — AB (ref 12.0–15.0)
IMMATURE GRANULOCYTES: 0 %
LYMPHS ABS: 1.5 10*3/uL (ref 0.7–4.0)
LYMPHS PCT: 30 %
MCH: 29.5 pg (ref 26.0–34.0)
MCHC: 31.2 g/dL (ref 30.0–36.0)
MCV: 94.5 fL (ref 80.0–100.0)
Monocytes Absolute: 0.4 10*3/uL (ref 0.1–1.0)
Monocytes Relative: 9 %
NEUTROS ABS: 3 10*3/uL (ref 1.7–7.7)
NEUTROS PCT: 60 %
Platelets: 182 10*3/uL (ref 150–400)
RBC: 3.8 MIL/uL — ABNORMAL LOW (ref 3.87–5.11)
RDW: 13.6 % (ref 11.5–15.5)
WBC: 5 10*3/uL (ref 4.0–10.5)
nRBC: 0 % (ref 0.0–0.2)

## 2018-01-23 LAB — COMPREHENSIVE METABOLIC PANEL
ALK PHOS: 46 U/L (ref 38–126)
ALT: 13 U/L (ref 0–44)
AST: 21 U/L (ref 15–41)
Albumin: 4.2 g/dL (ref 3.5–5.0)
Anion gap: 8 (ref 5–15)
BILIRUBIN TOTAL: 0.6 mg/dL (ref 0.3–1.2)
BUN: 31 mg/dL — AB (ref 8–23)
CO2: 27 mmol/L (ref 22–32)
CREATININE: 0.94 mg/dL (ref 0.44–1.00)
Calcium: 9.9 mg/dL (ref 8.9–10.3)
Chloride: 101 mmol/L (ref 98–111)
GFR calc Af Amer: >60 mL/min (ref >60)
Glucose, Bld: 89 mg/dL (ref 70–99)
Potassium: 3.9 mmol/L (ref 3.5–5.1)
Sodium: 136 mmol/L (ref 135–145)
Total Protein: 8 g/dL (ref 6.5–8.1)

## 2018-01-23 LAB — LACTATE DEHYDROGENASE: LDH: 155 U/L (ref 98–192)

## 2018-01-23 NOTE — Progress Notes (Signed)
Diagnosis Multiple myeloma in remission ([place]) - Plan: CBC with Differential/Platelet, Comprehensive metabolic panel, Lactate dehydrogenase, Protein electrophoresis, serum, IgG, IgA, IgM, Kappa/lambda light chains  Staging Cancer Staging Multiple myeloma ([facility]) Staging form: Multiple Myeloma, AJCC 6th Edition - Clinical stage from 10/26/2014: Stage IIA - Signed by Baird Cancer, PA-C on 10/26/2014 - Pathologic: No stage assigned - Unsigned   Assessment and Plan:  CURRENT THERAPY: Maintenance Ixaxomib (Ninlaro) 3 mg po on Days 1, 8, & 15 beginning 04/17/16 & Zometa every 3 months  Assessment and Plan:  28.  MM.  62 year old female with IgG kappa myeloma with high risk features -17 p and  -13 q..  She was stage II by R- ISS and achieved a PR prior to transplant.  Pt is s/p stem cell transplant on 05/07/15.  She will continue Maintenance Ixaxomib (Ninlaro) 3 mg po on Days 1, 8, & 15.  Definition of progression would mean M spike greater than 0.74.   Labs done 01/23/2018 reviewed and showed WBC 5 HB 11.2 plts 182,000.  Chemistries WNL with K+ 3.9 Cr 0.94 and normal LFTs.  SPEP done 11/20/2017 was stable at 0.7 g/dl.  Normal Quant IG.  FLC ratio 2.83.  Pt will follow-up in 03/2018 with repeat labs.  She will continue Zometa every 3 months.  Pt will see Dr. Norma Fredrickson in 04/2018.    2.  Elevated liver function tests.  LFTs WNL on labs done 01/23/2018.  Will repeat labs on RTC.   3  HTN.  BP is 116/55.   Pt should follow-up with PCP.    4.  Prophylaxis.  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    5.  Health maintenance.  Follow-up with GI and mammogram screening as recommended.    25 minutes spent with more than 50% spent in counseling and coordination of care.     Interval History:  Multiple Myeloma s/p stem cell transplant on 05/07/15  Current Status:  Pt is seen today for follow-up.  She is here to go over labs.  She continues to tolerate  Ninlaro.  She will see Dr. Norma Fredrickson in 04/2018.     Multiple myeloma ([place])   08/07/2014 Imaging    Bone Survey- No lytic lesions are noted in the visualized skeleton.    09/03/2014 Bone Marrow Biopsy    NORMOCELLULAR BONE MARROW FOR AGE WITH PLASMA CELL NEOPLASM.  The plasma cell component is increased in the marrow representing an estimated 18% of all cells. Cytogenetics with 13q-, 17p- (high risk disease)    09/03/2014 Pathology Results    Cytogenetics with 13q-, 17p- (high risk disease)    09/23/2014 Initial Diagnosis    Multiple myeloma    09/30/2014 PET scan    No abnormal hypermetabolism in the neck, chest, abdomen or pelvis.    10/05/2014 - 03/22/2015 Chemotherapy    RVD    10/28/2014 Treatment Plan Change    Issues related to getting Revlimid in a timely fashion, therefore, she received her Revlimid on 9/21 resulting in a 12 day cycle this time instead of a 14 day cycle    11/02/2014 Imaging    CTA chest- No evidence for a large or central pulmonary embolism as described.  8 mm density along the right minor fissure could represent focal pleural thickening but indeterminate. If the patient is at high risk for bronchogenic carcinoma, follow-up c    11/23/2014 Miscellaneous    Zometa 4 mg IV monthly  04/07/2015 Bone Marrow Biopsy    Normocellular marrow with 2-4% clonal plasma cells by immunohistochemistry. FISH and cytogenetics were normal [facility])      04/2015 Miscellaneous    PRETRANSPLANT EVALUATION:  Pulmonary function tests: FEV1 100.3% / DLCO 97.9%  Echocardiogram: Normal LV function with EF 60-65%     04/27/2015 Procedure    Stem cell mobilization with filgrastim and Mozobil [facility])    05/06/2015 Miscellaneous    BMT conditioning regimen with high-dose Melphalan given [facility], [name]); Day -1    05/07/2015 Bone Marrow Transplant    Outpatient autologous stem cell transplant [facility], [name]); Day 0    05/18/2015 Miscellaneous    WBC engraftment;  did not  require platelet transfusion during her transplant process. Lowest platelet count 28,000     05/20/2015 Procedure    Tunneled catheter removed [facility])    09/08/2015 -  Chemotherapy    Velcade every 2 weeks    12/15/2015 Miscellaneous    Zometa re-instituted.     12/23/2015 Imaging    Bone density- BMD as determined from Forearm Radius 33% is 0.799 g/cm2 with a T-Score of 1.2. This patient is considered normal according to [facility]) criteria.    04/17/2016 Miscellaneous    Started maintenance with Ninlaro.    05/04/2016 Treatment Plan Change    Zometa switched to Xgeva injection monthly given difficult IV access.       Problem List Patient Active Problem List   Diagnosis Date Noted  . Claustrophobia [F40.240] 10/05/2014  . Multiple myeloma ([place]) [C90.00] 09/23/2014  . Normocytic hypochromic anemia [D50.9] 08/03/2014  . Leukopenia [D72.819] 08/03/2014  . Primary osteoarthritis of both knees [M17.0] 11/18/2013    Past Medical History Past Medical History:  Diagnosis Date  . Anemia   . Arthritis   . Cancer ([place])   . Claustrophobia 10/05/2014  . Hypertension   . Leukopenia 08/03/2014  . Normocytic hypochromic anemia 08/03/2014  . Renal disorder    stage 3     Past Surgical History Past Surgical History:  Procedure Laterality Date  . ABDOMINAL HYSTERECTOMY    . CARDIAC SURGERY    . OTHER SURGICAL HISTORY     heart surgery as infant to "repair hole in heart"    Family History Family History  Problem Relation Age of Onset  . Cancer Mother   . Hypertension Mother   . Cancer Father   . Hypertension Father   . Cancer Maternal Grandmother   . Hypertension Sister   . Hypertension Brother      Social History  reports that she has never smoked. She has never used smokeless tobacco. She reports that she does not drink alcohol or use drugs.  Medications  Current Outpatient Medications:  .  acetaminophen (TYLENOL) 325 MG tablet, Take by mouth.,  Disp: , Rfl:  .  acyclovir (ZOVIRAX) 800 MG tablet, Take 1 tablet (800 mg total) by mouth 2 (two) times daily., Disp: 60 tablet, Rfl: 5 .  Ascorbic Acid (VITAMIN C) 1000 MG tablet, Take by mouth., Disp: , Rfl:  .  cholecalciferol (VITAMIN D) 1000 units tablet, Take 1,000 Units by mouth daily., Disp: , Rfl:  .  hydrochlorothiazide (HYDRODIURIL) 25 MG tablet, Take 1 tablet (25 mg total) by mouth daily., Disp: 30 tablet, Rfl: 0 .  hydrocortisone 2.5 % ointment, , Disp: , Rfl:  .  ixazomib citrate (NINLARO) 3 MG capsule, Take 1 capsule (9m) by mouth once a week on days 1, 8 & 15  every 28 day cycle. Take on empty stomach 1 hr before or 2 hrs after food, Disp: 3 capsule, Rfl: 4 .  Multiple Vitamin (TAB-A-VITE) TABS, TAKE 1 TABLET BY MOUTH ONCE DAILY., Disp: 30 tablet, Rfl: 11 .  potassium chloride SA (K-DUR,KLOR-CON) 20 MEQ tablet, Take 1 tablet (20 mEq total) by mouth 2 (two) times daily., Disp: 60 tablet, Rfl: 3 .  zolendronic acid (ZOMETA) 4 MG/5ML injection, Inject 4 mg into the vein every 28 (twenty-eight) days., Disp: , Rfl:  No current facility-administered medications for this visit.   Facility-Administered Medications Ordered in Other Visits:  .  heparin lock flush 100 unit/mL, 500 Units, Intravenous, Once, Penland, Shannon K, MD .  sodium chloride 0.9 % injection 10 mL, 10 mL, Intravenous, Once, Penland, Kelby Fam, MD  Allergies Patient has no known allergies.  Review of Systems Review of Systems - Oncology ROS negative   Physical Exam  Vitals Wt Readings from Last 3 Encounters:  01/23/18 (!) 323 lb 11.2 oz (146.8 kg)  01/02/18 (!) 319 lb (144.7 kg)  11/27/17 (!) 315 lb (142.9 kg)   Temp Readings from Last 3 Encounters:  01/23/18 97.9 F (36.6 C) (Oral)  11/27/17 98.2 F (36.8 C) (Oral)  11/27/17 98.1 F (36.7 C) (Oral)   BP Readings from Last 3 Encounters:  01/23/18 (!) 116/55  01/02/18 116/74  11/27/17 125/77   Pulse Readings from Last 3 Encounters:  01/23/18 70   01/02/18 86  11/27/17 75   Constitutional: Well-developed, well-nourished, and in no distress.   HENT: Head: Normocephalic and atraumatic.  Mouth/Throat: No oropharyngeal exudate. Mucosa moist. Eyes: Pupils are equal, round, and reactive to light. Conjunctivae are normal. No scleral icterus.  Neck: Normal range of motion. Neck supple. No JVD present.  Cardiovascular: Normal rate, regular rhythm and normal heart sounds.  Exam reveals no gallop and no friction rub.   No murmur heard. Pulmonary/Chest: Effort normal and breath sounds normal. No respiratory distress. No wheezes.No rales.  Abdominal: Soft. Bowel sounds are normal. No distension. There is no tenderness. There is no guarding.  Musculoskeletal: No edema or tenderness.  Lymphadenopathy: No cervical, axillary or supraclavicular adenopathy.  Neurological: Alert and oriented to person, place, and time. No cranial nerve deficit.  Skin: Skin is warm and dry. No rash noted. No erythema. No pallor.  Psychiatric: Affect and judgment normal.   Labs Appointment on 01/23/2018  Component Date Value Ref Range Status  . WBC 01/23/2018 5.0  4.0 - 10.5 K/uL Final  . RBC 01/23/2018 3.80* 3.87 - 5.11 MIL/uL Final  . Hemoglobin 01/23/2018 11.2* 12.0 - 15.0 g/dL Final  . HCT 01/23/2018 35.9* 36.0 - 46.0 % Final  . MCV 01/23/2018 94.5  80.0 - 100.0 fL Final  . MCH 01/23/2018 29.5  26.0 - 34.0 pg Final  . MCHC 01/23/2018 31.2  30.0 - 36.0 g/dL Final  . RDW 01/23/2018 13.6  11.5 - 15.5 % Final  . Platelets 01/23/2018 182  150 - 400 K/uL Final  . nRBC 01/23/2018 0.0  0.0 - 0.2 % Final  . Neutrophils Relative % 01/23/2018 60  % Final  . Neutro Abs 01/23/2018 3.0  1.7 - 7.7 K/uL Final  . Lymphocytes Relative 01/23/2018 30  % Final  . Lymphs Abs 01/23/2018 1.5  0.7 - 4.0 K/uL Final  . Monocytes Relative 01/23/2018 9  % Final  . Monocytes Absolute 01/23/2018 0.4  0.1 - 1.0 K/uL Final  . Eosinophils Relative 01/23/2018 1  % Final  . Eosinophils  Absolute 01/23/2018 0.0  0.0 - 0.5 K/uL Final  . Basophils Relative 01/23/2018 0  % Final  . Basophils Absolute 01/23/2018 0.0  0.0 - 0.1 K/uL Final  . Immature Granulocytes 01/23/2018 0  % Final  . Abs Immature Granulocytes 01/23/2018 0.01  0.00 - 0.07 K/uL Final   Performed at [facility], [address] [place], [place] [address]  . Sodium 01/23/2018 136  135 - 145 mmol/L Final  . Potassium 01/23/2018 3.9  3.5 - 5.1 mmol/L Final  . Chloride 01/23/2018 101  98 - 111 mmol/L Final  . CO2 01/23/2018 27  22 - 32 mmol/L Final  . Glucose, Bld 01/23/2018 89  70 - 99 mg/dL Final  . BUN 01/23/2018 31* 8 - 23 mg/dL Final  . Creatinine, Ser 01/23/2018 0.94  0.44 - 1.00 mg/dL Final  . Calcium 01/23/2018 9.9  8.9 - 10.3 mg/dL Final  . Total Protein 01/23/2018 8.0  6.5 - 8.1 g/dL Final  . Albumin 01/23/2018 4.2  3.5 - 5.0 g/dL Final  . AST 01/23/2018 21  15 - 41 U/L Final  . ALT 01/23/2018 13  0 - 44 U/L Final  . Alkaline Phosphatase 01/23/2018 46  38 - 126 U/L Final  . Total Bilirubin 01/23/2018 0.6  0.3 - 1.2 mg/dL Final  . GFR calc non Af Amer 01/23/2018 >60  >60 mL/min Final  . GFR calc Af Amer 01/23/2018 >60  >60 mL/min Final  . Anion gap 01/23/2018 8  5 - 15 Final   Performed at [facility], [address], [place], [place] [address]  . LDH 01/23/2018 155  98 - 192 U/L Final   Performed at [facility], [address], [place],  [address]     Pathology Orders Placed This Encounter  Procedures  . CBC with Differential/Platelet    Standing Status:   Future    Standing Expiration Date:   01/24/2020  . Comprehensive metabolic panel    Standing Status:   Future    Standing Expiration Date:   01/24/2020  . Lactate dehydrogenase    Standing Status:   Future    Standing Expiration Date:   01/24/2020  . Protein electrophoresis, serum    Standing Status:   Future    Standing Expiration Date:   01/24/2020  . IgG, IgA, IgM    Standing Status:   Future    Standing Expiration Date:    01/24/2020  . Kappa/lambda light chains    Standing Status:   Future    Standing Expiration Date:   01/24/2020       Zoila Shutter MD

## 2018-01-23 NOTE — Patient Instructions (Signed)
[facility]  Discharge Instructions: You saw Dr. Higgs today                               _______________________________________________________________  Thank you for choosing [facility] to provide your oncology and hematology care.  To afford each patient quality time with our providers, please arrive at least 15 minutes before your scheduled appointment.  You need to re-schedule your appointment if you arrive 10 or more minutes late.  We strive to give you quality time with our providers, and arriving late affects you and other patients whose appointments are after yours.  Also, if you no show three or more times for appointments you may be dismissed from the clinic.  Again, thank you for choosing [facility]. Our hope is that these requests will allow you access to exceptional care and in a timely manner. _______________________________________________________________  If you have questions after your visit, please contact our office at [phone] between the hours of 8:30 a.m. and 5:00 p.m. Voicemails left after 4:30 p.m. will not be returned until the following business day. _______________________________________________________________  For prescription refill requests, have your pharmacy contact our office. _______________________________________________________________  Recommendations made by the consultant and any test results will be sent to your referring physician. _______________________________________________________________ 

## 2018-01-24 LAB — PROTEIN ELECTROPHORESIS, SERUM
A/G RATIO SPE: 1 (ref 0.7–1.7)
ALPHA-2-GLOBULIN: 0.7 g/dL (ref 0.4–1.0)
Albumin ELP: 3.8 g/dL (ref 2.9–4.4)
Alpha-1-Globulin: 0.2 g/dL (ref 0.0–0.4)
Beta Globulin: 1 g/dL (ref 0.7–1.3)
GLOBULIN, TOTAL: 3.8 g/dL (ref 2.2–3.9)
Gamma Globulin: 1.7 g/dL (ref 0.4–1.8)
M-Spike, %: 0.7 g/dL — ABNORMAL HIGH
TOTAL PROTEIN ELP: 7.6 g/dL (ref 6.0–8.5)

## 2018-01-24 LAB — KAPPA/LAMBDA LIGHT CHAINS
KAPPA FREE LGHT CHN: 30.6 mg/L — AB (ref 3.3–19.4)
KAPPA, LAMDA LIGHT CHAIN RATIO: 3.52 — AB (ref 0.26–1.65)
LAMDA FREE LIGHT CHAINS: 8.7 mg/L (ref 5.7–26.3)

## 2018-01-24 LAB — IGG, IGA, IGM
IGA: 109 mg/dL (ref 87–352)
IgG (Immunoglobin G), Serum: 1852 mg/dL — ABNORMAL HIGH (ref 700–1600)
IgM (Immunoglobulin M), Srm: 46 mg/dL (ref 26–217)

## 2018-02-05 ENCOUNTER — Other Ambulatory Visit (HOSPITAL_COMMUNITY): Payer: Self-pay | Admitting: Hematology

## 2018-02-05 DIAGNOSIS — C9000 Multiple myeloma not having achieved remission: Secondary | ICD-10-CM

## 2018-02-11 ENCOUNTER — Other Ambulatory Visit (HOSPITAL_COMMUNITY): Payer: Self-pay | Admitting: *Deleted

## 2018-02-11 DIAGNOSIS — C9000 Multiple myeloma not having achieved remission: Secondary | ICD-10-CM

## 2018-02-11 MED ORDER — IXAZOMIB CITRATE 3 MG PO CAPS: ORAL_CAPSULE | ORAL | 4 refills | Status: DC

## 2018-02-18 ENCOUNTER — Other Ambulatory Visit (HOSPITAL_COMMUNITY): Payer: Self-pay

## 2018-02-18 DIAGNOSIS — C9001 Multiple myeloma in remission: Secondary | ICD-10-CM

## 2018-02-18 DIAGNOSIS — C9000 Multiple myeloma not having achieved remission: Secondary | ICD-10-CM

## 2018-02-19 ENCOUNTER — Inpatient Hospital Stay (HOSPITAL_COMMUNITY): Payer: Medicare Other

## 2018-02-19 ENCOUNTER — Inpatient Hospital Stay (HOSPITAL_COMMUNITY): Payer: Medicare Other | Attending: Hematology

## 2018-02-19 VITALS — BP 144/73 | HR 97 | Temp 98.0°F | Resp 16

## 2018-02-19 DIAGNOSIS — C9001 Multiple myeloma in remission: Secondary | ICD-10-CM | POA: Diagnosis not present

## 2018-02-19 DIAGNOSIS — C9000 Multiple myeloma not having achieved remission: Secondary | ICD-10-CM

## 2018-02-19 LAB — COMPREHENSIVE METABOLIC PANEL
ALT: 13 U/L (ref 0–44)
AST: 21 U/L (ref 15–41)
Albumin: 4.3 g/dL (ref 3.5–5.0)
Alkaline Phosphatase: 49 U/L (ref 38–126)
Anion gap: 9 (ref 5–15)
BUN: 20 mg/dL (ref 8–23)
CALCIUM: 9.5 mg/dL (ref 8.9–10.3)
CO2: 27 mmol/L (ref 22–32)
Chloride: 103 mmol/L (ref 98–111)
Creatinine, Ser: 0.93 mg/dL (ref 0.44–1.00)
GFR calc Af Amer: >60 mL/min (ref >60)
GFR calc non Af Amer: >60 mL/min (ref >60)
Glucose, Bld: 102 mg/dL — ABNORMAL HIGH (ref 70–99)
Potassium: 4.4 mmol/L (ref 3.5–5.1)
Sodium: 139 mmol/L (ref 135–145)
Total Bilirubin: 0.6 mg/dL (ref 0.3–1.2)
Total Protein: 8.5 g/dL — ABNORMAL HIGH (ref 6.5–8.1)

## 2018-02-19 MED ORDER — ZOLEDRONIC ACID 4 MG/100ML IV SOLN
4.0000 mg | Freq: Once | INTRAVENOUS | Status: AC
  Administered 2018-02-19: 4 mg via INTRAVENOUS
  Filled 2018-02-19: qty 100

## 2018-02-19 MED ORDER — SODIUM CHLORIDE 0.9 % IV SOLN
Freq: Once | INTRAVENOUS | Status: AC
  Administered 2018-02-19: 15:00:00 via INTRAVENOUS

## 2018-02-19 NOTE — Progress Notes (Signed)
Kelli Hurley tolerated Zometa infusion without incident or complaint. Ca level reviewed prior to administration. Pt report no recent or future dental work, no jaw or tooth pain. She continues to take her Ca and Vit D as instructed. New consent obtained prior to infusion.  Discharged self ambulatory in satisfactory condition.

## 2018-02-24 ENCOUNTER — Other Ambulatory Visit (HOSPITAL_COMMUNITY): Payer: Self-pay | Admitting: Internal Medicine

## 2018-02-24 DIAGNOSIS — E876 Hypokalemia: Secondary | ICD-10-CM

## 2018-02-25 ENCOUNTER — Encounter (HOSPITAL_COMMUNITY): Payer: Self-pay | Admitting: *Deleted

## 2018-02-25 NOTE — Telephone Encounter (Signed)
Patient notified via voicemail about not taking potassium until next lab work check. I asked that she return our call should she have any questions.

## 2018-02-25 NOTE — Progress Notes (Signed)
Patient notified via voicemail about not taking potassium until next lab work check. I also advised her that we would not be refilling her potassium RX until that time as well. I asked that she return our call should she have any questions.

## 2018-03-21 ENCOUNTER — Inpatient Hospital Stay (HOSPITAL_COMMUNITY): Payer: Medicare Other | Attending: Hematology

## 2018-03-21 DIAGNOSIS — R7989 Other specified abnormal findings of blood chemistry: Secondary | ICD-10-CM | POA: Diagnosis not present

## 2018-03-21 DIAGNOSIS — C9000 Multiple myeloma not having achieved remission: Secondary | ICD-10-CM | POA: Insufficient documentation

## 2018-03-21 DIAGNOSIS — E876 Hypokalemia: Secondary | ICD-10-CM | POA: Insufficient documentation

## 2018-03-21 DIAGNOSIS — Z8249 Family history of ischemic heart disease and other diseases of the circulatory system: Secondary | ICD-10-CM | POA: Insufficient documentation

## 2018-03-21 DIAGNOSIS — Z79899 Other long term (current) drug therapy: Secondary | ICD-10-CM | POA: Diagnosis not present

## 2018-03-21 DIAGNOSIS — B009 Herpesviral infection, unspecified: Secondary | ICD-10-CM | POA: Insufficient documentation

## 2018-03-21 DIAGNOSIS — I10 Essential (primary) hypertension: Secondary | ICD-10-CM | POA: Insufficient documentation

## 2018-03-21 DIAGNOSIS — C9001 Multiple myeloma in remission: Secondary | ICD-10-CM

## 2018-03-21 LAB — CBC WITH DIFFERENTIAL/PLATELET
Abs Immature Granulocytes: 0.02 10*3/uL (ref 0.00–0.07)
Basophils Absolute: 0 10*3/uL (ref 0.0–0.1)
Basophils Relative: 0 %
Eosinophils Absolute: 0.1 10*3/uL (ref 0.0–0.5)
Eosinophils Relative: 1 %
HCT: 35.3 % — ABNORMAL LOW (ref 36.0–46.0)
HEMOGLOBIN: 11 g/dL — AB (ref 12.0–15.0)
Immature Granulocytes: 0 %
Lymphocytes Relative: 29 %
Lymphs Abs: 1.4 10*3/uL (ref 0.7–4.0)
MCH: 29.6 pg (ref 26.0–34.0)
MCHC: 31.2 g/dL (ref 30.0–36.0)
MCV: 95.1 fL (ref 80.0–100.0)
Monocytes Absolute: 0.3 10*3/uL (ref 0.1–1.0)
Monocytes Relative: 7 %
NRBC: 0 % (ref 0.0–0.2)
Neutro Abs: 2.9 10*3/uL (ref 1.7–7.7)
Neutrophils Relative %: 63 %
Platelets: 178 10*3/uL (ref 150–400)
RBC: 3.71 MIL/uL — ABNORMAL LOW (ref 3.87–5.11)
RDW: 13.7 % (ref 11.5–15.5)
WBC: 4.6 10*3/uL (ref 4.0–10.5)

## 2018-03-21 LAB — COMPREHENSIVE METABOLIC PANEL
ALK PHOS: 46 U/L (ref 38–126)
ALT: 12 U/L (ref 0–44)
AST: 22 U/L (ref 15–41)
Albumin: 4.2 g/dL (ref 3.5–5.0)
Anion gap: 11 (ref 5–15)
BILIRUBIN TOTAL: 0.7 mg/dL (ref 0.3–1.2)
BUN: 25 mg/dL — ABNORMAL HIGH (ref 8–23)
CALCIUM: 9.5 mg/dL (ref 8.9–10.3)
CO2: 26 mmol/L (ref 22–32)
Chloride: 102 mmol/L (ref 98–111)
Creatinine, Ser: 0.97 mg/dL (ref 0.44–1.00)
GFR calc Af Amer: >60 mL/min (ref >60)
GFR calc non Af Amer: >60 mL/min (ref >60)
Glucose, Bld: 132 mg/dL — ABNORMAL HIGH (ref 70–99)
Potassium: 3.6 mmol/L (ref 3.5–5.1)
Sodium: 139 mmol/L (ref 135–145)
TOTAL PROTEIN: 8.1 g/dL (ref 6.5–8.1)

## 2018-03-21 LAB — LACTATE DEHYDROGENASE: LDH: 165 U/L (ref 98–192)

## 2018-03-22 LAB — PROTEIN ELECTROPHORESIS, SERUM
A/G RATIO SPE: 1.1 (ref 0.7–1.7)
Albumin ELP: 3.9 g/dL (ref 2.9–4.4)
Alpha-1-Globulin: 0.2 g/dL (ref 0.0–0.4)
Alpha-2-Globulin: 0.7 g/dL (ref 0.4–1.0)
Beta Globulin: 1.1 g/dL (ref 0.7–1.3)
Gamma Globulin: 1.7 g/dL (ref 0.4–1.8)
Globulin, Total: 3.7 g/dL (ref 2.2–3.9)
M-Spike, %: 0.7 g/dL — ABNORMAL HIGH
Total Protein ELP: 7.6 g/dL (ref 6.0–8.5)

## 2018-03-22 LAB — IGG, IGA, IGM
IGM (IMMUNOGLOBULIN M), SRM: 56 mg/dL (ref 26–217)
IgA: 114 mg/dL (ref 87–352)
IgG (Immunoglobin G), Serum: 1732 mg/dL — ABNORMAL HIGH (ref 700–1600)

## 2018-03-22 LAB — KAPPA/LAMBDA LIGHT CHAINS
KAPPA FREE LGHT CHN: 37.6 mg/L — AB (ref 3.3–19.4)
Kappa, lambda light chain ratio: 2.85 — ABNORMAL HIGH (ref 0.26–1.65)
Lambda free light chains: 13.2 mg/L (ref 5.7–26.3)

## 2018-03-28 ENCOUNTER — Other Ambulatory Visit: Payer: Self-pay

## 2018-03-28 ENCOUNTER — Inpatient Hospital Stay (HOSPITAL_BASED_OUTPATIENT_CLINIC_OR_DEPARTMENT_OTHER): Payer: Medicare Other | Admitting: Internal Medicine

## 2018-03-28 ENCOUNTER — Encounter (HOSPITAL_COMMUNITY): Payer: Self-pay | Admitting: Internal Medicine

## 2018-03-28 VITALS — BP 144/84 | HR 75 | Temp 98.3°F | Resp 18 | Wt 331.0 lb

## 2018-03-28 DIAGNOSIS — E876 Hypokalemia: Secondary | ICD-10-CM

## 2018-03-28 DIAGNOSIS — C9000 Multiple myeloma not having achieved remission: Secondary | ICD-10-CM | POA: Diagnosis not present

## 2018-03-28 DIAGNOSIS — Z8249 Family history of ischemic heart disease and other diseases of the circulatory system: Secondary | ICD-10-CM

## 2018-03-28 DIAGNOSIS — I10 Essential (primary) hypertension: Secondary | ICD-10-CM | POA: Diagnosis not present

## 2018-03-28 DIAGNOSIS — B009 Herpesviral infection, unspecified: Secondary | ICD-10-CM

## 2018-03-28 DIAGNOSIS — Z79899 Other long term (current) drug therapy: Secondary | ICD-10-CM

## 2018-03-28 DIAGNOSIS — R7989 Other specified abnormal findings of blood chemistry: Secondary | ICD-10-CM | POA: Diagnosis not present

## 2018-03-28 NOTE — Progress Notes (Signed)
Diagnosis No diagnosis found.  Staging Cancer Staging Multiple myeloma [facility]) Staging form: Multiple Myeloma, AJCC 6th Edition - Clinical stage from 10/26/2014: Stage IIA - Signed by Baird Cancer, PA-C on 10/26/2014 - Pathologic: No stage assigned - Unsigned   CURRENT THERAPY: Maintenance Ixaxomib (Ninlaro) 3 mg po on Days 1, 8, & 15 beginning 04/17/16 & Zometa every 3 months  Assessment and Plan:  15.  MM.  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  She was stage II by R- ISS and achieved a PR prior to transplant.  Pt is s/p stem cell transplant on 05/07/15.  She will continue Maintenance Ixaxomib (Ninlaro) 3 mg po on Days 1, 8, & 15.  Definition of progression would mean M spike greater than 0.74.   Labs done 03/21/2018 reviewed and showed WBC 4.6 HB 11 plts 178,000.  Chemistries WNL with K+ 3.6 Cr 0.97 and normal LFTs.  SPEP stable at 0.7 g/dl.  FLC ratio 2.85.  Pt scheduled to see Dr. Norma Fredrickson in 04/2018.  Pt will have labs done at [facility] at that time.  She will RTC 1 week after [facility] visit for follow-up.   She will continue Zometa every 3 months.    2.  Elevated liver function tests.  LFTs WNL on labs done 03/21/2018.  Pt scheduled for blood work at [facility]  in 04/2018.    3.  Hypokalemia.  K+ level is WNL at 3.6  Pt scheduled for blood work at [facility] in 04/2018.    4.  HTN.  BP is 144/84.  Follow-up with PCP.    5.  Prophylaxis.  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    6.  Health maintenance.  Follow-up with GI and mammogram screening as recommended.    25 minutes spent with more than 50% spent in counseling and coordination of care.     Interval History:  Multiple Myeloma s/p stem cell transplant on 05/07/15  Current Status:  Pt is seen today for follow-up.  She is here to go over labs.  She continues to tolerate Ninlaro.  She will see Dr. Norma Fredrickson in 04/2018.     Multiple myeloma  ([place])   08/07/2014 Imaging    Bone Survey- No lytic lesions are noted in the visualized skeleton.    09/03/2014 Bone Marrow Biopsy    NORMOCELLULAR BONE MARROW FOR AGE WITH PLASMA CELL NEOPLASM.  The plasma cell component is increased in the marrow representing an estimated 18% of all cells. Cytogenetics with 13q-, 17p- (high risk disease)    09/03/2014 Pathology Results    Cytogenetics with 13q-, 17p- (high risk disease)    09/23/2014 Initial Diagnosis    Multiple myeloma    09/30/2014 PET scan    No abnormal hypermetabolism in the neck, chest, abdomen or pelvis.    10/05/2014 - 03/22/2015 Chemotherapy    RVD    10/28/2014 Treatment Plan Change    Issues related to getting Revlimid in a timely fashion, therefore, she received her Revlimid on 9/21 resulting in a 12 day cycle this time instead of a 14 day cycle    11/02/2014 Imaging    CTA chest- No evidence for a large or central pulmonary embolism as described.  8 mm density along the right minor fissure could represent focal pleural thickening but indeterminate. If the patient is at high risk for bronchogenic carcinoma, follow-up c    11/23/2014  Miscellaneous    Zometa 4 mg IV monthly    04/07/2015 Bone Marrow Biopsy    Normocellular marrow with 2-4% clonal plasma cells by immunohistochemistry. FISH and cytogenetics were normal [facility])      04/2015 Miscellaneous    PRETRANSPLANT EVALUATION:  Pulmonary function tests: FEV1 100.3% / DLCO 97.9%  Echocardiogram: Normal LV function with EF 60-65%     04/27/2015 Procedure    Stem cell mobilization with filgrastim and Mozobil [facility])    05/06/2015 Miscellaneous    BMT conditioning regimen with high-dose Melphalan given [facility] [name]); Day -1    05/07/2015 Bone Marrow Transplant    Outpatient autologous stem cell transplant [facility], [name]); Day 0    05/18/2015 Miscellaneous    WBC engraftment;  did not require platelet transfusion during her transplant process. Lowest  platelet count 28,000     05/20/2015 Procedure    Tunneled catheter removed [facility])    09/08/2015 -  Chemotherapy    Velcade every 2 weeks    12/15/2015 Miscellaneous    Zometa re-instituted.     12/23/2015 Imaging    Bone density- BMD as determined from Forearm Radius 33% is 0.799 g/cm2 with a T-Score of 1.2. This patient is considered normal according to [facility]) criteria.    04/17/2016 Miscellaneous    Started maintenance with Ninlaro.    05/04/2016 Treatment Plan Change    Zometa switched to Xgeva injection monthly given difficult IV access.       Problem List Patient Active Problem List   Diagnosis Date Noted  . Claustrophobia [F40.240] 10/05/2014  . Multiple myeloma ([place]) [C90.00] 09/23/2014  . Normocytic hypochromic anemia [D50.9] 08/03/2014  . Leukopenia [D72.819] 08/03/2014  . Primary osteoarthritis of both knees [M17.0] 11/18/2013    Past Medical History Past Medical History:  Diagnosis Date  . Anemia   . Arthritis   . Cancer ([place])   . Claustrophobia 10/05/2014  . Hypertension   . Leukopenia 08/03/2014  . Normocytic hypochromic anemia 08/03/2014  . Renal disorder    stage 3     Past Surgical History Past Surgical History:  Procedure Laterality Date  . ABDOMINAL HYSTERECTOMY    . CARDIAC SURGERY    . OTHER SURGICAL HISTORY     heart surgery as infant to "repair hole in heart"    Family History Family History  Problem Relation Age of Onset  . Cancer Mother   . Hypertension Mother   . Cancer Father   . Hypertension Father   . Cancer Maternal Grandmother   . Hypertension Sister   . Hypertension Brother      Social History  reports that she has never smoked. She has never used smokeless tobacco. She reports that she does not drink alcohol or use drugs.  Medications  Current Outpatient Medications:  .  acyclovir (ZOVIRAX) 800 MG tablet, Take 1 tablet (800 mg total) by mouth 2 (two) times daily., Disp: 60 tablet, Rfl: 5 .   Ascorbic Acid (VITAMIN C) 1000 MG tablet, Take by mouth., Disp: , Rfl:  .  cholecalciferol (VITAMIN D) 1000 units tablet, Take 1,000 Units by mouth daily., Disp: , Rfl:  .  hydrochlorothiazide (HYDRODIURIL) 25 MG tablet, Take 1 tablet (25 mg total) by mouth daily., Disp: 30 tablet, Rfl: 0 .  hydrocortisone 2.5 % ointment, , Disp: , Rfl:  .  ixazomib citrate (NINLARO) 3 MG capsule, Take 1 capsule (24m) by mouth once a week on days 1, 8 & 15 every 28  day cycle. Take on empty stomach 1 hr before or 2 hrs after food, Disp: 3 capsule, Rfl: 4 .  Multiple Vitamin (TAB-A-VITE) TABS, TAKE 1 TABLET BY MOUTH ONCE DAILY., Disp: 30 tablet, Rfl: 11 .  zolendronic acid (ZOMETA) 4 MG/5ML injection, Inject 4 mg into the vein every 28 (twenty-eight) days., Disp: , Rfl:  .  acetaminophen (TYLENOL) 325 MG tablet, Take by mouth., Disp: , Rfl:  No current facility-administered medications for this visit.   Facility-Administered Medications Ordered in Other Visits:  .  heparin lock flush 100 unit/mL, 500 Units, Intravenous, Once, Penland, Shannon K, MD .  sodium chloride 0.9 % injection 10 mL, 10 mL, Intravenous, Once, Penland, Kelby Fam, MD  Allergies Patient has no known allergies.  Review of Systems Review of Systems - Oncology ROS negative   Physical Exam  Vitals Wt Readings from Last 3 Encounters:  03/28/18 (!) 331 lb (150.1 kg)  01/23/18 (!) 323 lb 11.2 oz (146.8 kg)  01/02/18 (!) 319 lb (144.7 kg)   Temp Readings from Last 3 Encounters:  03/28/18 98.3 F (36.8 C) (Oral)  02/19/18 98 F (36.7 C) (Oral)  01/23/18 97.9 F (36.6 C) (Oral)   BP Readings from Last 3 Encounters:  03/28/18 (!) 144/84  02/19/18 (!) 144/73  01/23/18 (!) 116/55   Pulse Readings from Last 3 Encounters:  03/28/18 75  02/19/18 97  01/23/18 70   Constitutional: Well-developed, well-nourished, and in no distress.   HENT: Head: Normocephalic and atraumatic.  Mouth/Throat: No oropharyngeal exudate. Mucosa  moist. Eyes: Pupils are equal, round, and reactive to light. Conjunctivae are normal. No scleral icterus.  Neck: Normal range of motion. Neck supple. No JVD present.  Cardiovascular: Normal rate, regular rhythm and normal heart sounds.  Exam reveals no gallop and no friction rub.   No murmur heard. Pulmonary/Chest: Effort normal and breath sounds normal. No respiratory distress. No wheezes.No rales.  Abdominal: Soft. Bowel sounds are normal. No distension. There is no tenderness. There is no guarding.  Musculoskeletal: No edema or tenderness.  Lymphadenopathy: No cervical, axillary or supraclavicular adenopathy.  Neurological: Alert and oriented to person, place, and time. No cranial nerve deficit.  Skin: Skin is warm and dry. No rash noted. No erythema. No pallor.  Psychiatric: Affect and judgment normal.   Labs No visits with results within 3 Day(s) from this visit.  Latest known visit with results is:  Appointment on 03/21/2018  Component Date Value Ref Range Status  . WBC 03/21/2018 4.6  4.0 - 10.5 K/uL Final  . RBC 03/21/2018 3.71* 3.87 - 5.11 MIL/uL Final  . Hemoglobin 03/21/2018 11.0* 12.0 - 15.0 g/dL Final  . HCT 03/21/2018 35.3* 36.0 - 46.0 % Final  . MCV 03/21/2018 95.1  80.0 - 100.0 fL Final  . MCH 03/21/2018 29.6  26.0 - 34.0 pg Final  . MCHC 03/21/2018 31.2  30.0 - 36.0 g/dL Final  . RDW 03/21/2018 13.7  11.5 - 15.5 % Final  . Platelets 03/21/2018 178  150 - 400 K/uL Final  . nRBC 03/21/2018 0.0  0.0 - 0.2 % Final  . Neutrophils Relative % 03/21/2018 63  % Final  . Neutro Abs 03/21/2018 2.9  1.7 - 7.7 K/uL Final  . Lymphocytes Relative 03/21/2018 29  % Final  . Lymphs Abs 03/21/2018 1.4  0.7 - 4.0 K/uL Final  . Monocytes Relative 03/21/2018 7  % Final  . Monocytes Absolute 03/21/2018 0.3  0.1 - 1.0 K/uL Final  . Eosinophils Relative 03/21/2018 1  %  Final  . Eosinophils Absolute 03/21/2018 0.1  0.0 - 0.5 K/uL Final  . Basophils Relative 03/21/2018 0  % Final  .  Basophils Absolute 03/21/2018 0.0  0.0 - 0.1 K/uL Final  . Immature Granulocytes 03/21/2018 0  % Final  . Abs Immature Granulocytes 03/21/2018 0.02  0.00 - 0.07 K/uL Final   Performed at [facility], [address], [place], [place] [address]  . Sodium 03/21/2018 139  135 - 145 mmol/L Final  . Potassium 03/21/2018 3.6  3.5 - 5.1 mmol/L Final  . Chloride 03/21/2018 102  98 - 111 mmol/L Final  . CO2 03/21/2018 26  22 - 32 mmol/L Final  . Glucose, Bld 03/21/2018 132* 70 - 99 mg/dL Final  . BUN 03/21/2018 25* 8 - 23 mg/dL Final  . Creatinine, Ser 03/21/2018 0.97  0.44 - 1.00 mg/dL Final  . Calcium 03/21/2018 9.5  8.9 - 10.3 mg/dL Final  . Total Protein 03/21/2018 8.1  6.5 - 8.1 g/dL Final  . Albumin 03/21/2018 4.2  3.5 - 5.0 g/dL Final  . AST 03/21/2018 22  15 - 41 U/L Final  . ALT 03/21/2018 12  0 - 44 U/L Final  . Alkaline Phosphatase 03/21/2018 46  38 - 126 U/L Final  . Total Bilirubin 03/21/2018 0.7  0.3 - 1.2 mg/dL Final  . GFR calc non Af Amer 03/21/2018 >60  >60 mL/min Final  . GFR calc Af Amer 03/21/2018 >60  >60 mL/min Final  . Anion gap 03/21/2018 11  5 - 15 Final   Performed at [facility], [address] [place], [place] [address]  . LDH 03/21/2018 165  98 - 192 U/L Final   Performed at [facility], [address] [place],  [address]  . Total Protein ELP 03/21/2018 7.6  6.0 - 8.5 g/dL Final  . Albumin ELP 03/21/2018 3.9  2.9 - 4.4 g/dL Final  . Alpha-1-Globulin 03/21/2018 0.2  0.0 - 0.4 g/dL Final  . Alpha-2-Globulin 03/21/2018 0.7  0.4 - 1.0 g/dL Final  . Beta Globulin 03/21/2018 1.1  0.7 - 1.3 g/dL Final  . Gamma Globulin 03/21/2018 1.7  0.4 - 1.8 g/dL Final  . M-Spike, % 03/21/2018 0.7* Not Observed g/dL Final  . SPE Interp. 03/21/2018 Comment   Final   Comment: (NOTE) The SPE pattern demonstrates a single peak (M-spike) in the gamma region which may represent monoclonal protein. This peak may also be caused by circulating immune complexes, cryoglobulins,  C-reactive protein, fibrinogen or hemolysis.  If clinically indicated, the presence of a monoclonal gammopathy may be confirmed by immuno- fixation, as well as an evaluation of the urine for the presence of Bence-Jones protein. Performed At: [facility] [place], [place] [id number] Rush Farmer MD OE:[id number]   . Comment 03/21/2018 Comment   Final   Comment: (NOTE) Protein electrophoresis scan will follow via computer, mail, or courier delivery.   . Globulin, Total 03/21/2018 3.7  2.2 - 3.9 g/dL Corrected  . A/G Ratio 03/21/2018 1.1  0.7 - 1.7 Corrected  . IgG (Immunoglobin G), Serum 03/21/2018 1,732* 700 - 1,600 mg/dL Final  . IgA 03/21/2018 114  87 - 352 mg/dL Final  . IgM (Immunoglobulin M), Srm 03/21/2018 56  26 - 217 mg/dL Final   Comment: (NOTE) Performed At: [facility] [place], [place] [id number] Rush Farmer MD PP:[id number]   . Kappa free light chain 03/21/2018 37.6* 3.3 - 19.4 mg/L Final  . Lamda free light chains 03/21/2018 13.2  5.7 - 26.3 mg/L Final  . Kappa, lamda light chain ratio 03/21/2018 2.85* 0.26 - 1.65 Final   Comment: (NOTE) Performed At: [facility] [place], [place] [id number] Rush Farmer MD IO:[id number]      Pathology No orders of the defined types were placed in this encounter.      Zoila Shutter MD

## 2018-04-30 ENCOUNTER — Ambulatory Visit (HOSPITAL_COMMUNITY): Payer: Medicare Other | Admitting: Hematology

## 2018-05-14 ENCOUNTER — Inpatient Hospital Stay (HOSPITAL_COMMUNITY): Payer: Medicare Other | Attending: Hematology

## 2018-05-14 ENCOUNTER — Ambulatory Visit (HOSPITAL_COMMUNITY): Payer: Medicare Other

## 2018-05-14 ENCOUNTER — Other Ambulatory Visit (HOSPITAL_COMMUNITY): Payer: Medicare Other

## 2018-05-14 ENCOUNTER — Encounter (HOSPITAL_COMMUNITY): Payer: Self-pay

## 2018-05-14 ENCOUNTER — Other Ambulatory Visit: Payer: Self-pay

## 2018-05-14 ENCOUNTER — Inpatient Hospital Stay (HOSPITAL_COMMUNITY): Payer: Medicare Other

## 2018-05-14 VITALS — BP 153/66 | HR 99 | Temp 98.5°F | Resp 18

## 2018-05-14 DIAGNOSIS — M25562 Pain in left knee: Secondary | ICD-10-CM | POA: Insufficient documentation

## 2018-05-14 DIAGNOSIS — R0602 Shortness of breath: Secondary | ICD-10-CM | POA: Diagnosis not present

## 2018-05-14 DIAGNOSIS — C9001 Multiple myeloma in remission: Secondary | ICD-10-CM

## 2018-05-14 DIAGNOSIS — Z79899 Other long term (current) drug therapy: Secondary | ICD-10-CM | POA: Diagnosis not present

## 2018-05-14 DIAGNOSIS — C9000 Multiple myeloma not having achieved remission: Secondary | ICD-10-CM | POA: Insufficient documentation

## 2018-05-14 DIAGNOSIS — M25561 Pain in right knee: Secondary | ICD-10-CM | POA: Insufficient documentation

## 2018-05-14 DIAGNOSIS — R5383 Other fatigue: Secondary | ICD-10-CM | POA: Insufficient documentation

## 2018-05-14 LAB — CBC WITH DIFFERENTIAL/PLATELET
Abs Immature Granulocytes: 0.02 10*3/uL (ref 0.00–0.07)
Basophils Absolute: 0 10*3/uL (ref 0.0–0.1)
Basophils Relative: 0 %
Eosinophils Absolute: 0.1 10*3/uL (ref 0.0–0.5)
Eosinophils Relative: 1 %
HCT: 36.9 % (ref 36.0–46.0)
Hemoglobin: 11.7 g/dL — ABNORMAL LOW (ref 12.0–15.0)
Immature Granulocytes: 0 %
Lymphocytes Relative: 27 %
Lymphs Abs: 1.5 10*3/uL (ref 0.7–4.0)
MCH: 29.8 pg (ref 26.0–34.0)
MCHC: 31.7 g/dL (ref 30.0–36.0)
MCV: 93.9 fL (ref 80.0–100.0)
Monocytes Absolute: 0.6 10*3/uL (ref 0.1–1.0)
Monocytes Relative: 11 %
Neutro Abs: 3.3 10*3/uL (ref 1.7–7.7)
Neutrophils Relative %: 61 %
Platelets: 217 10*3/uL (ref 150–400)
RBC: 3.93 MIL/uL (ref 3.87–5.11)
RDW: 13.3 % (ref 11.5–15.5)
WBC: 5.4 10*3/uL (ref 4.0–10.5)
nRBC: 0 % (ref 0.0–0.2)

## 2018-05-14 MED ORDER — SODIUM CHLORIDE 0.9 % IV SOLN
INTRAVENOUS | Status: DC
  Administered 2018-05-14: 12:00:00 via INTRAVENOUS

## 2018-05-14 MED ORDER — ZOLEDRONIC ACID 4 MG/100ML IV SOLN
4.0000 mg | Freq: Once | INTRAVENOUS | Status: AC
  Administered 2018-05-14: 4 mg via INTRAVENOUS
  Filled 2018-05-14: qty 100

## 2018-05-14 NOTE — Progress Notes (Signed)
Labs reviewed with pharmacy.  Last CMET was in February and last Zometa treatment was January 2020.    Patient tolerated infusion with no complaints voiced. Peripheral IV site clean and dry with good blood return noted before and after treatment.  Band aid applied.  VSS with discharge and left ambulatory with no s/s of distress noted.

## 2018-05-14 NOTE — Patient Instructions (Signed)
[facility] at [facility]  Discharge Instructions:   _______________________________________________________________  Thank you for choosing [facility] at [facility] to provide your oncology and hematology care.  To afford each patient quality time with our providers, please arrive at least 15 minutes before your scheduled appointment.  You need to re-schedule your appointment if you arrive 10 or more minutes late.  We strive to give you quality time with our providers, and arriving late affects you and other patients whose appointments are after yours.  Also, if you no show three or more times for appointments you may be dismissed from the clinic.  Again, thank you for choosing [facility] at [facility]. Our hope is that these requests will allow you access to exceptional care and in a timely manner. _______________________________________________________________  If you have questions after your visit, please contact our office at [phone] between the hours of 8:30 a.m. and 5:00 p.m. Voicemails left after 4:30 p.m. will not be returned until the following business day. _______________________________________________________________  For prescription refill requests, have your pharmacy contact our office. _______________________________________________________________  Recommendations made by the consultant and any test results will be sent to your referring physician. _______________________________________________________________ 

## 2018-05-21 ENCOUNTER — Encounter (HOSPITAL_COMMUNITY): Payer: Self-pay | Admitting: Hematology

## 2018-05-21 ENCOUNTER — Other Ambulatory Visit: Payer: Self-pay

## 2018-05-21 ENCOUNTER — Inpatient Hospital Stay (HOSPITAL_BASED_OUTPATIENT_CLINIC_OR_DEPARTMENT_OTHER): Payer: Medicare Other | Admitting: Hematology

## 2018-05-21 VITALS — BP 145/84 | HR 83 | Temp 98.3°F | Resp 20 | Wt 332.3 lb

## 2018-05-21 DIAGNOSIS — M25561 Pain in right knee: Secondary | ICD-10-CM | POA: Diagnosis not present

## 2018-05-21 DIAGNOSIS — C9000 Multiple myeloma not having achieved remission: Secondary | ICD-10-CM

## 2018-05-21 DIAGNOSIS — Z79899 Other long term (current) drug therapy: Secondary | ICD-10-CM

## 2018-05-21 DIAGNOSIS — C9001 Multiple myeloma in remission: Secondary | ICD-10-CM

## 2018-05-21 DIAGNOSIS — R0602 Shortness of breath: Secondary | ICD-10-CM

## 2018-05-21 DIAGNOSIS — R5383 Other fatigue: Secondary | ICD-10-CM

## 2018-05-21 DIAGNOSIS — M25562 Pain in left knee: Secondary | ICD-10-CM

## 2018-05-21 NOTE — Patient Instructions (Addendum)
[place] at [facility] Discharge Instructions  You were seen today by Dr. Delton Coombes. He went over your recent lab results. He will stop Zometa injections.  He will see you back in 4 weeks for labs and follow up.   Thank you for choosing [place] at [facility] to provide your oncology and hematology care.  To afford each patient quality time with our provider, please arrive at least 15 minutes before your scheduled appointment time.   If you have a lab appointment with the [place] please come in thru the  Main Entrance and check in at the main information desk  You need to re-schedule your appointment should you arrive 10 or more minutes late.  We strive to give you quality time with our providers, and arriving late affects you and other patients whose appointments are after yours.  Also, if you no show three or more times for appointments you may be dismissed from the clinic at the providers discretion.     Again, thank you for choosing [facility].  Our hope is that these requests will decrease the amount of time that you wait before being seen by our physicians.       _____________________________________________________________  Should you have questions after your visit to [facility], please contact our office at [phone] between the hours of 8:00 a.m. and 4:30 p.m.  Voicemails left after 4:00 p.m. will not be returned until the following business day.  For prescription refill requests, have your pharmacy contact our office and allow 72 hours.    Cancer Center Support Programs:   > Cancer Support Group  2nd Tuesday of the month 1pm-2pm, Journey Room

## 2018-05-21 NOTE — Assessment & Plan Note (Signed)
1.  IgG kappa plasma cell myeloma with high risk features (-17 p.m. -13 q.), stage II: - Date of diagnosis 09/2014 - Induction treatment with RVD started 10/05/2014 - Auto stem cell transplant on 05/07/2015 - Maintenance therapy with Ninlaro on days 1, 8, 15 of 28-day cycle started in March 2018 - Last myeloma panel on 03/21/2018 shows M spike of 0.7.  Free light chain ratio was 2.85.  Kappa free light chains of 37.6. - She is tolerating Ninlaro very well without any GI side effects.  Denies any peripheral neuropathy. -She will continue acyclovir twice daily. -I have recommended repeating myeloma panel and see her back in 4 weeks for follow-up.  2.  Bone strengthening: -He did receive zoledronic acid for more than 2 years.  Hence have recommended discontinuing it at this time.  3.  ID prophylaxis: -We will continue antiviral therapy with acyclovir 400 mg twice daily for shingles prophylaxis while on therapy with Ninlaro.

## 2018-05-21 NOTE — Progress Notes (Signed)
Kelli Hurley, [place] [address]   CLINIC:  Medical Oncology/Hematology  PCP:  Abran Richard, MD [address] [place] [address] [phone]   REASON FOR VISIT:  Follow-up for  h/o Multiple Myeloma s/p stem cell transplant on 05/07/15  CURRENT THERAPY: Maintenance Ixaxomib (Ninlaro) 3 mg po on Days 1, 8, & 15 beginning 04/17/16    BRIEF ONCOLOGIC HISTORY:    Multiple myeloma ([place])   08/07/2014 Imaging    Bone Survey- No lytic lesions are noted in the visualized skeleton.    09/03/2014 Bone Marrow Biopsy    NORMOCELLULAR BONE MARROW FOR AGE WITH PLASMA CELL NEOPLASM.  The plasma cell component is increased in the marrow representing an estimated 18% of all cells. Cytogenetics with 13q-, 17p- (high risk disease)    09/03/2014 Pathology Results    Cytogenetics with 13q-, 17p- (high risk disease)    09/23/2014 Initial Diagnosis    Multiple myeloma    09/30/2014 PET scan    No abnormal hypermetabolism in the neck, chest, abdomen or pelvis.    10/05/2014 - 03/22/2015 Chemotherapy    RVD    10/28/2014 Treatment Plan Change    Issues related to getting Revlimid in a timely fashion, therefore, she received her Revlimid on 9/21 resulting in a 12 day cycle this time instead of a 14 day cycle    11/02/2014 Imaging    CTA chest- No evidence for a large or central pulmonary embolism as described.  8 mm density along the right minor fissure could represent focal pleural thickening but indeterminate. If the patient is at high risk for bronchogenic carcinoma, follow-up c    11/23/2014 Miscellaneous    Zometa 4 mg IV monthly    04/07/2015 Bone Marrow Biopsy    Normocellular marrow with 2-4% clonal plasma cells by immunohistochemistry. FISH and cytogenetics were normal [facility])      04/2015 Miscellaneous    PRETRANSPLANT EVALUATION:  Pulmonary function tests: FEV1 100.3% / DLCO 97.9%  Echocardiogram: Normal LV function with EF 60-65%     04/27/2015  Procedure    Stem cell mobilization with filgrastim and Mozobil [place])    05/06/2015 Miscellaneous    BMT conditioning regimen with high-dose Melphalan given [facility] [name]); Day -1    05/07/2015 Bone Marrow Transplant    Outpatient autologous stem cell transplant [name]); Day 0    05/18/2015 Miscellaneous    WBC engraftment;  did not require platelet transfusion during her transplant process. Lowest platelet count 28,000     05/20/2015 Procedure    Tunneled catheter removed [facility])    09/08/2015 -  Chemotherapy    Velcade every 2 weeks    12/15/2015 Miscellaneous    Zometa re-instituted.     12/23/2015 Imaging    Bone density- BMD as determined from Forearm Radius 33% is 0.799 g/cm2 with a T-Score of 1.2. This patient is considered normal according to [facility]) criteria.    04/17/2016 Miscellaneous    Started maintenance with Ninlaro.    05/04/2016 Treatment Plan Change    Zometa switched to Xgeva injection monthly given difficult IV access.       CANCER STAGING: Cancer Staging Multiple myeloma [facility]) Staging form: Multiple Myeloma, AJCC 6th Edition - Clinical stage from 10/26/2014: Stage IIA - Signed by Baird Cancer, PA-C on 10/26/2014 - Pathologic: No stage assigned - Unsigned    INTERVAL HISTORY:  Kelli Hurley 63 y.o. female returns for routine  follow-up. She is here today alone. She states that she has some fatigue. She states that she has bilateral knee pain and shortness of breath with exertion. Denies any nausea, vomiting, or diarrhea. Denies any new pains. Had not noticed any recent bleeding such as epistaxis, hematuria or hematochezia. Denies recent chest pain on exertion, pre-syncopal episodes, or palpitations. Denies any numbness or tingling in hands or feet. Denies any recent fevers, infections, or recent hospitalizations. Patient reports appetite at 100% and energy level at 75%.    REVIEW OF SYSTEMS:  Review  of Systems  Constitutional: Positive for fatigue.  Respiratory: Positive for shortness of breath.      PAST MEDICAL/SURGICAL HISTORY:  Past Medical History:  Diagnosis Date  . Anemia   . Arthritis   . Cancer ([place])   . Claustrophobia 10/05/2014  . Hypertension   . Leukopenia 08/03/2014  . Normocytic hypochromic anemia 08/03/2014  . Renal disorder    stage 3    Past Surgical History:  Procedure Laterality Date  . ABDOMINAL HYSTERECTOMY    . CARDIAC SURGERY    . OTHER SURGICAL HISTORY     heart surgery as infant to "repair hole in heart"     SOCIAL HISTORY:  Social History   Socioeconomic History  . Marital status: Legally Separated    Spouse name: Not on file  . Number of children: Not on file  . Years of education: Not on file  . Highest education level: Not on file  Occupational History  . Not on file  Social Needs  . Financial resource strain: Not on file  . Food insecurity:    Worry: Not on file    Inability: Not on file  . Transportation needs:    Medical: Not on file    Non-medical: Not on file  Tobacco Use  . Smoking status: Never Smoker  . Smokeless tobacco: Never Used  Substance and Sexual Activity  . Alcohol use: No  . Drug use: No  . Sexual activity: Not on file    Comment: divorced- 2 daughters  Lifestyle  . Physical activity:    Days per week: Not on file    Minutes per session: Not on file  . Stress: Not on file  Relationships  . Social connections:    Talks on phone: Not on file    Gets together: Not on file    Attends religious service: Not on file    Active member of club or organization: Not on file    Attends meetings of clubs or organizations: Not on file    Relationship status: Not on file  . Intimate partner violence:    Fear of current or ex partner: Not on file    Emotionally abused: Not on file    Physically abused: Not on file    Forced sexual activity: Not on file  Other Topics Concern  . Not on file  Social History  Narrative  . Not on file    FAMILY HISTORY:  Family History  Problem Relation Age of Onset  . Cancer Mother   . Hypertension Mother   . Cancer Father   . Hypertension Father   . Cancer Maternal Grandmother   . Hypertension Sister   . Hypertension Brother     CURRENT MEDICATIONS:  Outpatient Encounter Medications as of 05/21/2018  Medication Sig  . acetaminophen (TYLENOL) 325 MG tablet Take by mouth.  Marland Kitchen acyclovir (ZOVIRAX) 800 MG tablet Take 1 tablet (800 mg total) by mouth 2 (  two) times daily.  . Ascorbic Acid (VITAMIN C) 1000 MG tablet Take by mouth.  . cholecalciferol (VITAMIN D) 1000 units tablet Take 1,000 Units by mouth daily.  . hydrochlorothiazide (HYDRODIURIL) 25 MG tablet Take 1 tablet (25 mg total) by mouth daily.  . hydrocortisone 2.5 % ointment   . ixazomib citrate (NINLARO) 3 MG capsule Take 1 capsule (52m) by mouth once a week on days 1, 8 & 15 every 28 day cycle. Take on empty stomach 1 hr before or 2 hrs after food  . Multiple Vitamin (TAB-A-VITE) TABS TAKE 1 TABLET BY MOUTH ONCE DAILY.  .Marland Kitchenzolendronic acid (ZOMETA) 4 MG/5ML injection Inject 4 mg into the vein every 28 (twenty-eight) days.   Facility-Administered Encounter Medications as of 05/21/2018  Medication  . heparin lock flush 100 unit/mL  . sodium chloride 0.9 % injection 10 mL    ALLERGIES:  No Known Allergies   PHYSICAL EXAM:  ECOG Performance status: 1  Vitals:   05/21/18 0905  BP: (!) 145/84  Pulse: 83  Resp: 20  Temp: 98.3 F (36.8 C)  SpO2: 98%   Filed Weights   05/21/18 0905  Weight: (!) 332 lb 4.8 oz (150.7 kg)    Physical Exam Vitals signs reviewed.  Constitutional:      Appearance: Normal appearance.  Cardiovascular:     Rate and Rhythm: Normal rate and regular rhythm.     Heart sounds: Normal heart sounds.  Pulmonary:     Effort: Pulmonary effort is normal.     Breath sounds: Normal breath sounds.  Abdominal:     General: Bowel sounds are normal. There is no  distension.     Palpations: Abdomen is soft.  Musculoskeletal:        General: No swelling.  Skin:    General: Skin is warm.  Neurological:     General: No focal deficit present.     Mental Status: She is alert and oriented to person, place, and time.  Psychiatric:        Mood and Affect: Mood normal.        Behavior: Behavior normal.      LABORATORY DATA:  I have reviewed the labs as listed.  CBC    Component Value Date/Time   WBC 5.4 05/14/2018 1013   RBC 3.93 05/14/2018 1013   HGB 11.7 (L) 05/14/2018 1013   HCT 36.9 05/14/2018 1013   PLT 217 05/14/2018 1013   MCV 93.9 05/14/2018 1013   MCH 29.8 05/14/2018 1013   MCHC 31.7 05/14/2018 1013   RDW 13.3 05/14/2018 1013   LYMPHSABS 1.5 05/14/2018 1013   MONOABS 0.6 05/14/2018 1013   EOSABS 0.1 05/14/2018 1013   BASOSABS 0.0 05/14/2018 1013   CMP Latest Ref Rng & Units 03/21/2018 02/19/2018 01/23/2018  Glucose 70 - 99 mg/dL 132(H) 102(H) 89  BUN 8 - 23 mg/dL 25(H) 20 31(H)  Creatinine 0.44 - 1.00 mg/dL 0.97 0.93 0.94  Sodium 135 - 145 mmol/L 139 139 136  Potassium 3.5 - 5.1 mmol/L 3.6 4.4 3.9  Chloride 98 - 111 mmol/L 102 103 101  CO2 22 - 32 mmol/L _0 Calcium 8.9 - 10.3 mg/dL 9.5 9.5 9.9  Total Protein 6.5 - 8.1 g/dL 8.1 8.5(H) 8.0  Total Bilirubin 0.3 - 1.2 mg/dL 0.7 0.6 0.6  Alkaline Phos 38 - 126 U/L 46 49 46  AST 15 - 41 U/L _1 ALT 0 - 44 U/L 12 13 13  DIAGNOSTIC IMAGING:  I have independently reviewed the scans and discussed with the patient.   I have reviewed Kelli Lick LPN's note and agree with the documentation.  I personally performed a face-to-face visit, made revisions and my assessment and plan is as follows.    ASSESSMENT & PLAN:   Multiple myeloma 1.  IgG kappa plasma cell myeloma with high risk features (-17 p.m. -13 q.), stage II: - Date of diagnosis 09/2014 - Induction treatment with RVD started 10/05/2014 - Auto stem cell transplant on 05/07/2015 - Maintenance  therapy with Ninlaro on days 1, 8, 15 of 28-day cycle started in March 2018 - Last myeloma panel on 03/21/2018 shows M spike of 0.7.  Free light chain ratio was 2.85.  Kappa free light chains of 37.6. - She is tolerating Ninlaro very well without any GI side effects.  Denies any peripheral neuropathy. -She will continue acyclovir twice daily. -I have recommended repeating myeloma panel and see her back in 4 weeks for follow-up.  2.  Bone strengthening: -He did receive zoledronic acid for more than 2 years.  Hence have recommended discontinuing it at this time.  3.  ID prophylaxis: -We will continue antiviral therapy with acyclovir 400 mg twice daily for shingles prophylaxis while on therapy with Ninlaro.  Total time spent is 25 minutes with more than 50% of the time spent face-to-face discussing management of myeloma, side effects and coordination of care.    Orders placed this encounter:  Orders Placed This Encounter  Procedures  . CBC with Differential/Platelet  . Comprehensive metabolic panel  . Protein electrophoresis, serum  . Kappa/lambda light chains  . Lactate dehydrogenase      Derek Jack, MD [place] [phone]

## 2018-05-28 ENCOUNTER — Other Ambulatory Visit (HOSPITAL_COMMUNITY): Payer: Self-pay | Admitting: Internal Medicine

## 2018-05-28 DIAGNOSIS — C9000 Multiple myeloma not having achieved remission: Secondary | ICD-10-CM

## 2018-05-28 DIAGNOSIS — Z79899 Other long term (current) drug therapy: Secondary | ICD-10-CM

## 2018-05-29 ENCOUNTER — Other Ambulatory Visit (HOSPITAL_COMMUNITY): Payer: Self-pay | Admitting: Internal Medicine

## 2018-05-29 ENCOUNTER — Other Ambulatory Visit (HOSPITAL_COMMUNITY): Payer: Self-pay | Admitting: *Deleted

## 2018-05-29 DIAGNOSIS — Z79899 Other long term (current) drug therapy: Secondary | ICD-10-CM

## 2018-05-29 DIAGNOSIS — C9000 Multiple myeloma not having achieved remission: Secondary | ICD-10-CM

## 2018-05-29 MED ORDER — ACYCLOVIR 800 MG PO TABS: 800.0000 mg | ORAL_TABLET | Freq: Two times a day (BID) | ORAL | 5 refills | Status: DC

## 2018-05-29 MED ORDER — HYDROCHLOROTHIAZIDE 25 MG PO TABS: 25.0000 mg | ORAL_TABLET | Freq: Every day | ORAL | 0 refills | Status: DC

## 2018-06-03 ENCOUNTER — Other Ambulatory Visit (HOSPITAL_COMMUNITY): Payer: Self-pay | Admitting: *Deleted

## 2018-06-03 DIAGNOSIS — C9000 Multiple myeloma not having achieved remission: Secondary | ICD-10-CM

## 2018-06-03 MED ORDER — IXAZOMIB CITRATE 3 MG PO CAPS: ORAL_CAPSULE | ORAL | 4 refills | Status: DC

## 2018-06-11 ENCOUNTER — Other Ambulatory Visit: Payer: Self-pay

## 2018-06-11 ENCOUNTER — Inpatient Hospital Stay (HOSPITAL_COMMUNITY): Payer: Medicare Other | Attending: Hematology

## 2018-06-11 DIAGNOSIS — R200 Anesthesia of skin: Secondary | ICD-10-CM | POA: Insufficient documentation

## 2018-06-11 DIAGNOSIS — C9001 Multiple myeloma in remission: Secondary | ICD-10-CM | POA: Diagnosis present

## 2018-06-11 LAB — CBC WITH DIFFERENTIAL/PLATELET
Abs Immature Granulocytes: 0.01 10*3/uL (ref 0.00–0.07)
Basophils Absolute: 0 10*3/uL (ref 0.0–0.1)
Basophils Relative: 0 %
Eosinophils Absolute: 0 10*3/uL (ref 0.0–0.5)
Eosinophils Relative: 1 %
HCT: 35.3 % — ABNORMAL LOW (ref 36.0–46.0)
Hemoglobin: 11.3 g/dL — ABNORMAL LOW (ref 12.0–15.0)
Immature Granulocytes: 0 %
Lymphocytes Relative: 31 %
Lymphs Abs: 1.4 10*3/uL (ref 0.7–4.0)
MCH: 30.1 pg (ref 26.0–34.0)
MCHC: 32 g/dL (ref 30.0–36.0)
MCV: 94.1 fL (ref 80.0–100.0)
Monocytes Absolute: 0.6 10*3/uL (ref 0.1–1.0)
Monocytes Relative: 12 %
Neutro Abs: 2.6 10*3/uL (ref 1.7–7.7)
Neutrophils Relative %: 56 %
Platelets: 172 10*3/uL (ref 150–400)
RBC: 3.75 MIL/uL — ABNORMAL LOW (ref 3.87–5.11)
RDW: 13.6 % (ref 11.5–15.5)
WBC: 4.6 10*3/uL (ref 4.0–10.5)
nRBC: 0 % (ref 0.0–0.2)

## 2018-06-11 LAB — COMPREHENSIVE METABOLIC PANEL
ALT: 15 U/L (ref 0–44)
AST: 24 U/L (ref 15–41)
Albumin: 4.1 g/dL (ref 3.5–5.0)
Alkaline Phosphatase: 53 U/L (ref 38–126)
Anion gap: 8 (ref 5–15)
BUN: 22 mg/dL (ref 8–23)
CO2: 29 mmol/L (ref 22–32)
Calcium: 10 mg/dL (ref 8.9–10.3)
Chloride: 103 mmol/L (ref 98–111)
Creatinine, Ser: 0.96 mg/dL (ref 0.44–1.00)
GFR calc Af Amer: >60 mL/min (ref >60)
GFR calc non Af Amer: >60 mL/min (ref >60)
Glucose, Bld: 96 mg/dL (ref 70–99)
Potassium: 4 mmol/L (ref 3.5–5.1)
Sodium: 140 mmol/L (ref 135–145)
Total Bilirubin: 0.3 mg/dL (ref 0.3–1.2)
Total Protein: 8.4 g/dL — ABNORMAL HIGH (ref 6.5–8.1)

## 2018-06-11 LAB — LACTATE DEHYDROGENASE: LDH: 167 U/L (ref 98–192)

## 2018-06-12 LAB — PROTEIN ELECTROPHORESIS, SERUM
A/G Ratio: 1 (ref 0.7–1.7)
Albumin ELP: 3.8 g/dL (ref 2.9–4.4)
Alpha-1-Globulin: 0.2 g/dL (ref 0.0–0.4)
Alpha-2-Globulin: 0.7 g/dL (ref 0.4–1.0)
Beta Globulin: 1.1 g/dL (ref 0.7–1.3)
Gamma Globulin: 1.7 g/dL (ref 0.4–1.8)
Globulin, Total: 3.8 g/dL (ref 2.2–3.9)
M-Spike, %: 0.8 g/dL — ABNORMAL HIGH
Total Protein ELP: 7.6 g/dL (ref 6.0–8.5)

## 2018-06-12 LAB — KAPPA/LAMBDA LIGHT CHAINS
Kappa free light chain: 41.6 mg/L — ABNORMAL HIGH (ref 3.3–19.4)
Kappa, lambda light chain ratio: 2.95 — ABNORMAL HIGH (ref 0.26–1.65)
Lambda free light chains: 14.1 mg/L (ref 5.7–26.3)

## 2018-06-17 ENCOUNTER — Other Ambulatory Visit: Payer: Self-pay

## 2018-06-18 ENCOUNTER — Encounter (HOSPITAL_COMMUNITY): Payer: Self-pay | Admitting: Hematology

## 2018-06-18 ENCOUNTER — Inpatient Hospital Stay (HOSPITAL_BASED_OUTPATIENT_CLINIC_OR_DEPARTMENT_OTHER): Payer: Medicare Other | Admitting: Hematology

## 2018-06-18 VITALS — BP 127/68 | HR 81 | Temp 97.9°F | Resp 18 | Wt 339.0 lb

## 2018-06-18 DIAGNOSIS — C9001 Multiple myeloma in remission: Secondary | ICD-10-CM

## 2018-06-18 DIAGNOSIS — R200 Anesthesia of skin: Secondary | ICD-10-CM | POA: Diagnosis not present

## 2018-06-18 NOTE — Assessment & Plan Note (Addendum)
1.  IgG kappa plasma cell myeloma with high risk features (-17 p.m. -13 q.), stage II: - Date of diagnosis 09/2014 - Induction treatment with RVD started 10/05/2014 - Auto stem cell transplant on 05/07/2015 - Maintenance therapy with Ninlaro on days 1, 8, 15 of 28-day cycle started in March 2018 - She is tolerating Ninlaro very well without any GI side effects. - MM labs from 06/11/2018; M-spike continues to increase, 0.8, K/L ratio: also has increased 2.95. Recommend to continue to monitor. If M-spike continues to increase to 1.0, would recommend to change treatment.  - RTC in 4 weeks with repeat MM panel.    2.  Bone strengthening: -She received zoledronic acid for more than 2 years.  Hence we have discontinued it.  3.  ID prophylaxis: -She will continue acyclovir twice daily.

## 2018-06-18 NOTE — Progress Notes (Signed)
[place], [place] [address]   CLINIC:  Medical Oncology/Hematology  PCP:  Abran Richard, MD [address]  [address] [phone]   REASON FOR VISIT:  Follow-up for multiple myeloma   BRIEF ONCOLOGIC HISTORY:    Multiple myeloma ([place])   08/07/2014 Imaging    Bone Survey- No lytic lesions are noted in the visualized skeleton.    09/03/2014 Bone Marrow Biopsy    NORMOCELLULAR BONE MARROW FOR AGE WITH PLASMA CELL NEOPLASM.  The plasma cell component is increased in the marrow representing an estimated 18% of all cells. Cytogenetics with 13q-, 17p- (high risk disease)    09/03/2014 Pathology Results    Cytogenetics with 13q-, 17p- (high risk disease)    09/23/2014 Initial Diagnosis    Multiple myeloma    09/30/2014 PET scan    No abnormal hypermetabolism in the neck, chest, abdomen or pelvis.    10/05/2014 - 03/22/2015 Chemotherapy    RVD    10/28/2014 Treatment Plan Change    Issues related to getting Revlimid in a timely fashion, therefore, she received her Revlimid on 9/21 resulting in a 12 day cycle this time instead of a 14 day cycle    11/02/2014 Imaging    CTA chest- No evidence for a large or central pulmonary embolism as described.  8 mm density along the right minor fissure could represent focal pleural thickening but indeterminate. If the patient is at high risk for bronchogenic carcinoma, follow-up c    11/23/2014 Miscellaneous    Zometa 4 mg IV monthly    04/07/2015 Bone Marrow Biopsy    Normocellular marrow with 2-4% clonal plasma cells by immunohistochemistry. FISH and cytogenetics were normal [facility])      04/2015 Miscellaneous    PRETRANSPLANT EVALUATION:  Pulmonary function tests: FEV1 100.3% / DLCO 97.9%  Echocardiogram: Normal LV function with EF 60-65%     04/27/2015 Procedure    Stem cell mobilization with filgrastim and Mozobil [facility])    05/06/2015 Miscellaneous    BMT conditioning regimen with  high-dose Melphalan given [facility] [name]); Day -1    05/07/2015 Bone Marrow Transplant    Outpatient autologous stem cell transplant [facility], [name]); Day 0    05/18/2015 Miscellaneous    WBC engraftment;  did not require platelet transfusion during her transplant process. Lowest platelet count 28,000     05/20/2015 Procedure    Tunneled catheter removed [facility])    09/08/2015 -  Chemotherapy    Velcade every 2 weeks    12/15/2015 Miscellaneous    Zometa re-instituted.     12/23/2015 Imaging    Bone density- BMD as determined from Forearm Radius 33% is 0.799 g/cm2 with a T-Score of 1.2. This patient is considered normal according to [facility]) criteria.    04/17/2016 Miscellaneous    Started maintenance with Ninlaro.    05/04/2016 Treatment Plan Change    Zometa switched to Xgeva injection monthly given difficult IV access.       CANCER STAGING: Cancer Staging Multiple myeloma [facility]) Staging form: Multiple Myeloma, AJCC 6th Edition - Clinical stage from 10/26/2014: Stage IIA - Signed by Baird Cancer, PA-C on 10/26/2014 - Pathologic: No stage assigned - Unsigned    INTERVAL HISTORY:  Ms. Ferrufino 63 y.o. female returns for routine follow-up. She is here today alone. She state that she continues to experience numbness in her right hand that she has had before but went away.  She states that she has been feeling well since her last visit. Denies any nausea, vomiting, or diarrhea. Denies any new pains. Had not noticed any recent bleeding such as epistaxis, hematuria or hematochezia. Denies recent chest pain on exertion, shortness of breath on minimal exertion, pre-syncopal episodes, or palpitations. Denies any numbness or tingling in feet. Denies any recent fevers, infections, or recent hospitalizations. Patient reports appetite at 100% and energy level at 75%.   REVIEW OF SYSTEMS:  Review of Systems  Neurological: Positive for numbness.      PAST MEDICAL/SURGICAL HISTORY:  Past Medical History:  Diagnosis Date  . Anemia   . Arthritis   . Cancer ([place])   . Claustrophobia 10/05/2014  . Hypertension   . Leukopenia 08/03/2014  . Normocytic hypochromic anemia 08/03/2014  . Renal disorder    stage 3    Past Surgical History:  Procedure Laterality Date  . ABDOMINAL HYSTERECTOMY    . CARDIAC SURGERY    . OTHER SURGICAL HISTORY     heart surgery as infant to "repair hole in heart"     SOCIAL HISTORY:  Social History   Socioeconomic History  . Marital status: Legally Separated    Spouse name: Not on file  . Number of children: Not on file  . Years of education: Not on file  . Highest education level: Not on file  Occupational History  . Not on file  Social Needs  . Financial resource strain: Not on file  . Food insecurity:    Worry: Not on file    Inability: Not on file  . Transportation needs:    Medical: Not on file    Non-medical: Not on file  Tobacco Use  . Smoking status: Never Smoker  . Smokeless tobacco: Never Used  Substance and Sexual Activity  . Alcohol use: No  . Drug use: No  . Sexual activity: Not on file    Comment: divorced- 2 daughters  Lifestyle  . Physical activity:    Days per week: Not on file    Minutes per session: Not on file  . Stress: Not on file  Relationships  . Social connections:    Talks on phone: Not on file    Gets together: Not on file    Attends religious service: Not on file    Active member of club or organization: Not on file    Attends meetings of clubs or organizations: Not on file    Relationship status: Not on file  . Intimate partner violence:    Fear of current or ex partner: Not on file    Emotionally abused: Not on file    Physically abused: Not on file    Forced sexual activity: Not on file  Other Topics Concern  . Not on file  Social History Narrative  . Not on file    FAMILY HISTORY:  Family History  Problem Relation Age of Onset  . Cancer  Mother   . Hypertension Mother   . Cancer Father   . Hypertension Father   . Cancer Maternal Grandmother   . Hypertension Sister   . Hypertension Brother     CURRENT MEDICATIONS:  Outpatient Encounter Medications as of 06/18/2018  Medication Sig  . acetaminophen (TYLENOL) 325 MG tablet Take by mouth.  Marland Kitchen acyclovir (ZOVIRAX) 800 MG tablet Take 1 tablet (800 mg total) by mouth 2 (two) times daily.  . Ascorbic Acid (VITAMIN C) 1000 MG tablet Take by mouth.  . cholecalciferol (VITAMIN D)  1000 units tablet Take 1,000 Units by mouth daily.  . hydrochlorothiazide (HYDRODIURIL) 25 MG tablet Take 1 tablet (25 mg total) by mouth daily.  . hydrocortisone 2.5 % ointment   . ixazomib citrate (NINLARO) 3 MG capsule Take 1 capsule (27m) by mouth once a week on days 1, 8 & 15 every 28 day cycle. Take on empty stomach 1 hr before or 2 hrs after food  . Multiple Vitamin (TAB-A-VITE) TABS TAKE 1 TABLET BY MOUTH ONCE DAILY.  . [DISCONTINUED] zolendronic acid (ZOMETA) 4 MG/5ML injection Inject 4 mg into the vein every 28 (twenty-eight) days.   Facility-Administered Encounter Medications as of 06/18/2018  Medication  . heparin lock flush 100 unit/mL  . sodium chloride 0.9 % injection 10 mL    ALLERGIES:  No Known Allergies   PHYSICAL EXAM:  ECOG Performance status: 1  Vitals:   06/18/18 1002  BP: 127/68  Pulse: 81  Resp: 18  Temp: 97.9 F (36.6 C)  SpO2: 100%   Filed Weights   06/18/18 1002  Weight: (!) 339 lb (153.8 kg)    Physical Exam Vitals signs reviewed.  Constitutional:      Appearance: Normal appearance.  Cardiovascular:     Rate and Rhythm: Normal rate and regular rhythm.     Heart sounds: Normal heart sounds.  Pulmonary:     Effort: Pulmonary effort is normal.     Breath sounds: Normal breath sounds.  Abdominal:     General: There is no distension.     Palpations: Abdomen is soft. There is no mass.  Musculoskeletal:        General: No swelling.  Skin:    General:  Skin is warm.  Neurological:     General: No focal deficit present.     Mental Status: She is alert and oriented to person, place, and time.  Psychiatric:        Mood and Affect: Mood normal.        Behavior: Behavior normal.      LABORATORY DATA:  I have reviewed the labs as listed.  CBC    Component Value Date/Time   WBC 4.6 06/11/2018 1000   RBC 3.75 (L) 06/11/2018 1000   HGB 11.3 (L) 06/11/2018 1000   HCT 35.3 (L) 06/11/2018 1000   PLT 172 06/11/2018 1000   MCV 94.1 06/11/2018 1000   MCH 30.1 06/11/2018 1000   MCHC 32.0 06/11/2018 1000   RDW 13.6 06/11/2018 1000   LYMPHSABS 1.4 06/11/2018 1000   MONOABS 0.6 06/11/2018 1000   EOSABS 0.0 06/11/2018 1000   BASOSABS 0.0 06/11/2018 1000   CMP Latest Ref Rng & Units 06/11/2018 03/21/2018 02/19/2018  Glucose 70 - 99 mg/dL 96 132(H) 102(H)  BUN 8 - 23 mg/dL 22 25(H) 20  Creatinine 0.44 - 1.00 mg/dL 0.96 0.97 0.93  Sodium 135 - 145 mmol/L 140 139 139  Potassium 3.5 - 5.1 mmol/L 4.0 3.6 4.4  Chloride 98 - 111 mmol/L 103 102 103  CO2 22 - 32 mmol/L _0 Calcium 8.9 - 10.3 mg/dL 10.0 9.5 9.5  Total Protein 6.5 - 8.1 g/dL 8.4(H) 8.1 8.5(H)  Total Bilirubin 0.3 - 1.2 mg/dL 0.3 0.7 0.6  Alkaline Phos 38 - 126 U/L 53 46 49  AST 15 - 41 U/L _1 ALT 0 - 44 U/L _2 DIAGNOSTIC IMAGING:  I have independently reviewed the scans and discussed with the patient.   I  have reviewed Venita Lick LPN's note and agree with the documentation.  I personally performed a face-to-face visit, made revisions and my assessment and plan is as follows.    ASSESSMENT & PLAN:   Multiple myeloma 1.  IgG kappa plasma cell myeloma with high risk features (-17 p.m. -13 q.), stage II: - Date of diagnosis 09/2014 - Induction treatment with RVD started 10/05/2014 - Auto stem cell transplant on 05/07/2015 - Maintenance therapy with Ninlaro on days 1, 8, 15 of 28-day cycle started in March 2018 - She is tolerating Ninlaro very well  without any GI side effects. - MM labs from 06/11/2018; M-spike continues to increase, 0.8, K/L ratio: also has increased 2.95. Recommend to continue to monitor. If M-spike continues to increase to 1.0, would recommend to change treatment.  - RTC in 4 weeks with repeat MM panel.    2.  Bone strengthening: -She received zoledronic acid for more than 2 years.  Hence we have discontinued it.  3.  ID prophylaxis: -She will continue acyclovir twice daily.   Total time spent is 25 minutes with more than 50% of the time spent face-to-face discussing treatment plan and coordination of care.    Orders placed this encounter:  Orders Placed This Encounter  Procedures  . CBC with Differential/Platelet  . Comprehensive metabolic panel  . Protein electrophoresis, serum  . Kappa/lambda light chains  . Lactate dehydrogenase      Derek Jack, MD [place] [phone]

## 2018-06-18 NOTE — Patient Instructions (Addendum)
[place] at [facility] Discharge Instructions  You were seen today by Dr. Delton Coombes. He went over your recent lab results. Your myeloma numbers increased slightly and he discussed monitoring your labs and the possibility of changing your treatment. He will see you back in 1 month for labs and follow up.   Thank you for choosing [place] at [facility] to provide your oncology and hematology care.  To afford each patient quality time with our provider, please arrive at least 15 minutes before your scheduled appointment time.   If you have a lab appointment with the [place] please come in thru the  Main Entrance and check in at the main information desk  You need to re-schedule your appointment should you arrive 10 or more minutes late.  We strive to give you quality time with our providers, and arriving late affects you and other patients whose appointments are after yours.  Also, if you no show three or more times for appointments you may be dismissed from the clinic at the providers discretion.     Again, thank you for choosing [facility].  Our hope is that these requests will decrease the amount of time that you wait before being seen by our physicians.       _____________________________________________________________  Should you have questions after your visit to [facility], please contact our office at [phone] between the hours of 8:00 a.m. and 4:30 p.m.  Voicemails left after 4:00 p.m. will not be returned until the following business day.  For prescription refill requests, have your pharmacy contact our office and allow 72 hours.    Cancer Center Support Programs:   > Cancer Support Group  2nd Tuesday of the month 1pm-2pm, Journey Room

## 2018-07-07 ENCOUNTER — Other Ambulatory Visit (HOSPITAL_COMMUNITY): Payer: Self-pay | Admitting: Hematology

## 2018-07-07 DIAGNOSIS — C9000 Multiple myeloma not having achieved remission: Secondary | ICD-10-CM

## 2018-07-16 ENCOUNTER — Other Ambulatory Visit: Payer: Self-pay

## 2018-07-16 ENCOUNTER — Inpatient Hospital Stay (HOSPITAL_COMMUNITY): Payer: Medicare Other | Attending: Hematology

## 2018-07-16 DIAGNOSIS — C9000 Multiple myeloma not having achieved remission: Secondary | ICD-10-CM | POA: Diagnosis not present

## 2018-07-16 DIAGNOSIS — C9001 Multiple myeloma in remission: Secondary | ICD-10-CM

## 2018-07-16 LAB — CBC WITH DIFFERENTIAL/PLATELET
Abs Immature Granulocytes: 0.04 10*3/uL (ref 0.00–0.07)
Basophils Absolute: 0 10*3/uL (ref 0.0–0.1)
Basophils Relative: 0 %
Eosinophils Absolute: 0.1 10*3/uL (ref 0.0–0.5)
Eosinophils Relative: 1 %
HCT: 36.3 % (ref 36.0–46.0)
Hemoglobin: 11.5 g/dL — ABNORMAL LOW (ref 12.0–15.0)
Immature Granulocytes: 1 %
Lymphocytes Relative: 30 %
Lymphs Abs: 1.8 10*3/uL (ref 0.7–4.0)
MCH: 29.7 pg (ref 26.0–34.0)
MCHC: 31.7 g/dL (ref 30.0–36.0)
MCV: 93.8 fL (ref 80.0–100.0)
Monocytes Absolute: 0.7 10*3/uL (ref 0.1–1.0)
Monocytes Relative: 11 %
Neutro Abs: 3.3 10*3/uL (ref 1.7–7.7)
Neutrophils Relative %: 57 %
Platelets: 251 10*3/uL (ref 150–400)
RBC: 3.87 MIL/uL (ref 3.87–5.11)
RDW: 14.2 % (ref 11.5–15.5)
WBC: 5.8 10*3/uL (ref 4.0–10.5)
nRBC: 0 % (ref 0.0–0.2)

## 2018-07-16 LAB — COMPREHENSIVE METABOLIC PANEL
ALT: 13 U/L (ref 0–44)
AST: 22 U/L (ref 15–41)
Albumin: 4.3 g/dL (ref 3.5–5.0)
Alkaline Phosphatase: 53 U/L (ref 38–126)
Anion gap: 12 (ref 5–15)
BUN: 24 mg/dL — ABNORMAL HIGH (ref 8–23)
CO2: 25 mmol/L (ref 22–32)
Calcium: 10.1 mg/dL (ref 8.9–10.3)
Chloride: 104 mmol/L (ref 98–111)
Creatinine, Ser: 1.15 mg/dL — ABNORMAL HIGH (ref 0.44–1.00)
GFR calc Af Amer: 59 mL/min — ABNORMAL LOW (ref >60)
GFR calc non Af Amer: 51 mL/min — ABNORMAL LOW (ref >60)
Glucose, Bld: 97 mg/dL (ref 70–99)
Potassium: 4.2 mmol/L (ref 3.5–5.1)
Sodium: 141 mmol/L (ref 135–145)
Total Bilirubin: 0.5 mg/dL (ref 0.3–1.2)
Total Protein: 8.6 g/dL — ABNORMAL HIGH (ref 6.5–8.1)

## 2018-07-16 LAB — LACTATE DEHYDROGENASE: LDH: 178 U/L (ref 98–192)

## 2018-07-17 LAB — KAPPA/LAMBDA LIGHT CHAINS
Kappa free light chain: 48 mg/L — ABNORMAL HIGH (ref 3.3–19.4)
Kappa, lambda light chain ratio: 3.29 — ABNORMAL HIGH (ref 0.26–1.65)
Lambda free light chains: 14.6 mg/L (ref 5.7–26.3)

## 2018-07-17 LAB — PROTEIN ELECTROPHORESIS, SERUM
A/G Ratio: 1 (ref 0.7–1.7)
Albumin ELP: 4 g/dL (ref 2.9–4.4)
Alpha-1-Globulin: 0.2 g/dL (ref 0.0–0.4)
Alpha-2-Globulin: 0.7 g/dL (ref 0.4–1.0)
Beta Globulin: 1.1 g/dL (ref 0.7–1.3)
Gamma Globulin: 1.8 g/dL (ref 0.4–1.8)
Globulin, Total: 3.9 g/dL (ref 2.2–3.9)
M-Spike, %: 0.9 g/dL — ABNORMAL HIGH
Total Protein ELP: 7.9 g/dL (ref 6.0–8.5)

## 2018-07-19 ENCOUNTER — Other Ambulatory Visit: Payer: Self-pay

## 2018-07-22 ENCOUNTER — Encounter (HOSPITAL_COMMUNITY): Payer: Self-pay | Admitting: Hematology

## 2018-07-22 ENCOUNTER — Inpatient Hospital Stay (HOSPITAL_BASED_OUTPATIENT_CLINIC_OR_DEPARTMENT_OTHER): Payer: Medicare Other | Admitting: Hematology

## 2018-07-22 ENCOUNTER — Other Ambulatory Visit: Payer: Self-pay

## 2018-07-22 DIAGNOSIS — C9000 Multiple myeloma not having achieved remission: Secondary | ICD-10-CM

## 2018-07-22 NOTE — Progress Notes (Signed)
Virtual Visit via Telephone Note  I connected with Kelli Hurley on 07/22/18 at  3:20 PM EDT by telephone and verified that I am speaking with the correct person using two identifiers.   I discussed the limitations, risks, security and privacy concerns of performing an evaluation and management service by telephone and the availability of in person appointments. I also discussed with the patient that there may be a patient responsible charge related to this service. The patient expressed understanding and agreed to proceed.   History of Present Illness: 1. IgG kappa plasma cell myeloma with high risk features 2.  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  Denies any new onset pains.  Denies any diarrhea.  No worsening of neuropathy in the right hand.  Denies any fevers or infections.  Denies any ER visits or hospitalizations.  She does not appear to be in any acute respiratory distress while compensating.  Assessment and Plan:  1.  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 -Maintenance therapy with Ninlaro started in March 2018. - We reviewed results of the myeloma blood work from 07/16/2018.  M spike has increased to 0.9 g/dL.  Hemoglobin is 11.5.  Creatinine increased to 1.15.  Free light chain ratio has increased to 3.29 with kappa light chains 48. - I have recommended follow-up visit in 4 weeks.  We will plan to repeat another myeloma panel in 3 weeks.  I will also do skeletal survey. - She will need change in therapy at that time.  We will plan to introduce a daratumumab-based regimen because of progression.  2.  Bone strengthening: -She received zoledronic acid for more than 2 years.  Hence it is on hold at this time.  3.  ID prophylaxis: -She was told to continue acyclovir twice daily for shingles prophylaxis.   Follow Up  Instructions: RTC 4 weeks with labs and x-rays 3 weeks prior.   I discussed the assessment and treatment plan with the patient. The patient was provided an opportunity to ask questions and all were answered. The patient agreed with the plan and demonstrated an understanding of the instructions.   The patient was advised to call back or seek an in-person evaluation if the symptoms worsen or if the condition fails to improve as anticipated.  I provided 22 minutes of non-face-to-face time during this encounter.   Derek Jack, MD

## 2018-08-03 ENCOUNTER — Other Ambulatory Visit (HOSPITAL_COMMUNITY): Payer: Self-pay | Admitting: Hematology

## 2018-08-03 DIAGNOSIS — C9000 Multiple myeloma not having achieved remission: Secondary | ICD-10-CM

## 2018-08-06 ENCOUNTER — Ambulatory Visit (HOSPITAL_COMMUNITY): Payer: Medicare Other

## 2018-08-06 ENCOUNTER — Other Ambulatory Visit (HOSPITAL_COMMUNITY): Payer: Medicare Other

## 2018-08-19 ENCOUNTER — Inpatient Hospital Stay (HOSPITAL_COMMUNITY): Payer: Medicare Other | Attending: Hematology

## 2018-08-19 ENCOUNTER — Other Ambulatory Visit: Payer: Self-pay

## 2018-08-19 DIAGNOSIS — R200 Anesthesia of skin: Secondary | ICD-10-CM | POA: Insufficient documentation

## 2018-08-19 DIAGNOSIS — I10 Essential (primary) hypertension: Secondary | ICD-10-CM | POA: Diagnosis not present

## 2018-08-19 DIAGNOSIS — C9000 Multiple myeloma not having achieved remission: Secondary | ICD-10-CM | POA: Diagnosis not present

## 2018-08-19 DIAGNOSIS — Z79899 Other long term (current) drug therapy: Secondary | ICD-10-CM | POA: Insufficient documentation

## 2018-08-19 DIAGNOSIS — M25561 Pain in right knee: Secondary | ICD-10-CM | POA: Insufficient documentation

## 2018-08-19 DIAGNOSIS — Z8249 Family history of ischemic heart disease and other diseases of the circulatory system: Secondary | ICD-10-CM | POA: Diagnosis not present

## 2018-08-19 DIAGNOSIS — M25562 Pain in left knee: Secondary | ICD-10-CM | POA: Diagnosis not present

## 2018-08-19 LAB — CBC WITH DIFFERENTIAL/PLATELET
Abs Immature Granulocytes: 0.03 10*3/uL (ref 0.00–0.07)
Basophils Absolute: 0 10*3/uL (ref 0.0–0.1)
Basophils Relative: 0 %
Eosinophils Absolute: 0.1 10*3/uL (ref 0.0–0.5)
Eosinophils Relative: 1 %
HCT: 34.4 % — ABNORMAL LOW (ref 36.0–46.0)
Hemoglobin: 11 g/dL — ABNORMAL LOW (ref 12.0–15.0)
Immature Granulocytes: 1 %
Lymphocytes Relative: 25 %
Lymphs Abs: 1.3 10*3/uL (ref 0.7–4.0)
MCH: 30.1 pg (ref 26.0–34.0)
MCHC: 32 g/dL (ref 30.0–36.0)
MCV: 94 fL (ref 80.0–100.0)
Monocytes Absolute: 0.6 10*3/uL (ref 0.1–1.0)
Monocytes Relative: 11 %
Neutro Abs: 3.3 10*3/uL (ref 1.7–7.7)
Neutrophils Relative %: 62 %
Platelets: 197 10*3/uL (ref 150–400)
RBC: 3.66 MIL/uL — ABNORMAL LOW (ref 3.87–5.11)
RDW: 14.7 % (ref 11.5–15.5)
WBC: 5.3 10*3/uL (ref 4.0–10.5)
nRBC: 0 % (ref 0.0–0.2)

## 2018-08-19 LAB — COMPREHENSIVE METABOLIC PANEL
ALT: 14 U/L (ref 0–44)
AST: 20 U/L (ref 15–41)
Albumin: 4 g/dL (ref 3.5–5.0)
Alkaline Phosphatase: 44 U/L (ref 38–126)
Anion gap: 12 (ref 5–15)
BUN: 23 mg/dL (ref 8–23)
CO2: 27 mmol/L (ref 22–32)
Calcium: 9.6 mg/dL (ref 8.9–10.3)
Chloride: 101 mmol/L (ref 98–111)
Creatinine, Ser: 1.05 mg/dL — ABNORMAL HIGH (ref 0.44–1.00)
GFR calc Af Amer: >60 mL/min (ref >60)
GFR calc non Af Amer: 57 mL/min — ABNORMAL LOW (ref >60)
Glucose, Bld: 98 mg/dL (ref 70–99)
Potassium: 3.8 mmol/L (ref 3.5–5.1)
Sodium: 140 mmol/L (ref 135–145)
Total Bilirubin: 0.5 mg/dL (ref 0.3–1.2)
Total Protein: 7.8 g/dL (ref 6.5–8.1)

## 2018-08-19 LAB — LACTATE DEHYDROGENASE: LDH: 153 U/L (ref 98–192)

## 2018-08-20 LAB — PROTEIN ELECTROPHORESIS, SERUM
A/G Ratio: 1.1 (ref 0.7–1.7)
Albumin ELP: 3.9 g/dL (ref 2.9–4.4)
Alpha-1-Globulin: 0.2 g/dL (ref 0.0–0.4)
Alpha-2-Globulin: 0.7 g/dL (ref 0.4–1.0)
Beta Globulin: 0.9 g/dL (ref 0.7–1.3)
Gamma Globulin: 1.6 g/dL (ref 0.4–1.8)
Globulin, Total: 3.4 g/dL (ref 2.2–3.9)
M-Spike, %: 0.8 g/dL — ABNORMAL HIGH
Total Protein ELP: 7.3 g/dL (ref 6.0–8.5)

## 2018-08-20 LAB — IMMUNOFIXATION ELECTROPHORESIS
IgA: 81 mg/dL — ABNORMAL LOW (ref 87–352)
IgG (Immunoglobin G), Serum: 1802 mg/dL — ABNORMAL HIGH (ref 586–1602)
IgM (Immunoglobulin M), Srm: 49 mg/dL (ref 26–217)
Total Protein ELP: 7.4 g/dL (ref 6.0–8.5)

## 2018-08-20 LAB — KAPPA/LAMBDA LIGHT CHAINS
Kappa free light chain: 36.8 mg/L — ABNORMAL HIGH (ref 3.3–19.4)
Kappa, lambda light chain ratio: 2.97 — ABNORMAL HIGH (ref 0.26–1.65)
Lambda free light chains: 12.4 mg/L (ref 5.7–26.3)

## 2018-08-20 LAB — BETA 2 MICROGLOBULIN, SERUM: Beta-2 Microglobulin: 3.5 mg/L — ABNORMAL HIGH (ref 0.6–2.4)

## 2018-08-26 ENCOUNTER — Encounter (HOSPITAL_COMMUNITY): Payer: Self-pay | Admitting: Hematology

## 2018-08-26 ENCOUNTER — Other Ambulatory Visit: Payer: Self-pay

## 2018-08-26 ENCOUNTER — Inpatient Hospital Stay (HOSPITAL_BASED_OUTPATIENT_CLINIC_OR_DEPARTMENT_OTHER): Payer: Medicare Other | Admitting: Hematology

## 2018-08-26 VITALS — BP 135/79 | HR 89 | Temp 98.7°F | Resp 18 | Wt 344.0 lb

## 2018-08-26 DIAGNOSIS — I10 Essential (primary) hypertension: Secondary | ICD-10-CM

## 2018-08-26 DIAGNOSIS — M25562 Pain in left knee: Secondary | ICD-10-CM

## 2018-08-26 DIAGNOSIS — Z79899 Other long term (current) drug therapy: Secondary | ICD-10-CM

## 2018-08-26 DIAGNOSIS — C9000 Multiple myeloma not having achieved remission: Secondary | ICD-10-CM

## 2018-08-26 DIAGNOSIS — Z8249 Family history of ischemic heart disease and other diseases of the circulatory system: Secondary | ICD-10-CM

## 2018-08-26 DIAGNOSIS — R200 Anesthesia of skin: Secondary | ICD-10-CM | POA: Diagnosis not present

## 2018-08-26 DIAGNOSIS — M25561 Pain in right knee: Secondary | ICD-10-CM | POA: Diagnosis not present

## 2018-08-26 NOTE — Assessment & Plan Note (Signed)
1.  IgG kappa myeloma with high risk features (del 17 P, -13 q.), stage II: -Date of diagnosis in August 2016.  Induction treatment with RVD. 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 -Maintenance therapy with Ninlaro on days 1, 8, 15 every 28 days started in March 2018. -He is continuing to tolerate Ninlaro very well without any major GI side effects.  No generalized numbness.  Right hand numbness on and off is stable. - We reviewed myeloma panel from 08/19/2018.  M spike improved to 0.8 from 0.9 g.  Light chain ratio improved to 2.97 from 3.29.  Kappa light chains improved to 36.8 from 48.  Creatinine also improved to 1.05. - Her creatinine has been elevated for the last 2 times.  She is on hydrochlorothiazide 25 mg.  Hemoglobin is 11. - As her myeloma panel is slightly better, I did not recommend any change of therapy at this time.  We will see her back in 4 weeks with repeat myeloma panel.  If there is any worsening, will consider daratumumab-based regimen.  2.  ID prophylaxis: -She will continue acyclovir twice daily.  3.  Bone strengthening: - She completed zoledronic acid for more than 2 years.  Hence we have discontinued it.

## 2018-08-26 NOTE — Progress Notes (Signed)
Patient is taking ninlaro and has not missed any doses and reports no side effects at this time.

## 2018-08-26 NOTE — Progress Notes (Signed)
[address] [place], [place] [address]   CLINIC:  Medical Oncology/Hematology  PCP:  Abran Richard, MD [address] [place] [address] [phone]   REASON FOR VISIT:  Follow-up for multiple myeloma   BRIEF ONCOLOGIC HISTORY:  Oncology History  Multiple myeloma ([place])  08/07/2014 Imaging   Bone Survey- No lytic lesions are noted in the visualized skeleton.   09/03/2014 Bone Marrow Biopsy   NORMOCELLULAR BONE MARROW FOR AGE WITH PLASMA CELL NEOPLASM.  The plasma cell component is increased in the marrow representing an estimated 18% of all cells. Cytogenetics with 13q-, 17p- (high risk disease)   09/03/2014 Pathology Results   Cytogenetics with 13q-, 17p- (high risk disease)   09/23/2014 Initial Diagnosis   Multiple myeloma   09/30/2014 PET scan   No abnormal hypermetabolism in the neck, chest, abdomen or pelvis.   10/05/2014 - 03/22/2015 Chemotherapy   RVD   10/28/2014 Treatment Plan Change   Issues related to getting Revlimid in a timely fashion, therefore, she received her Revlimid on 9/21 resulting in a 12 day cycle this time instead of a 14 day cycle   11/02/2014 Imaging   CTA chest- No evidence for a large or central pulmonary embolism as described.  8 mm density along the right minor fissure could represent focal pleural thickening but indeterminate. If the patient is at high risk for bronchogenic carcinoma, follow-up c   11/23/2014 Miscellaneous   Zometa 4 mg IV monthly   04/07/2015 Bone Marrow Biopsy   Normocellular marrow with 2-4% clonal plasma cells by immunohistochemistry. FISH and cytogenetics were normal [facility])     04/2015 Miscellaneous   PRETRANSPLANT EVALUATION:  Pulmonary function tests: FEV1 100.3% / DLCO 97.9%  Echocardiogram: Normal LV function with EF 60-65%    04/27/2015 Procedure   Stem cell mobilization with filgrastim and Mozobil [facility])   05/06/2015 Miscellaneous   BMT conditioning regimen with high-dose  Melphalan given [facility], [name]); Day -1   05/07/2015 Bone Marrow Transplant   Outpatient autologous stem cell transplant [facility], [name]); Day 0   05/18/2015 Miscellaneous   WBC engraftment;  did not require platelet transfusion during her transplant process. Lowest platelet count 28,000    05/20/2015 Procedure   Tunneled catheter removed [facility])   09/08/2015 -  Chemotherapy   Velcade every 2 weeks   12/15/2015 Miscellaneous   Zometa re-instituted.    12/23/2015 Imaging   Bone density- BMD as determined from Forearm Radius 33% is 0.799 g/cm2 with a T-Score of 1.2. This patient is considered normal according to [facility]) criteria.   04/17/2016 Miscellaneous   Started maintenance with Ninlaro.   05/04/2016 Treatment Plan Change   Zometa switched to Xgeva injection monthly given difficult IV access.       CANCER STAGING: Cancer Staging Multiple myeloma [facility]) Staging form: Multiple Myeloma, AJCC 6th Edition - Clinical stage from 10/26/2014: Stage IIA - Signed by Baird Cancer, PA-C on 10/26/2014 - Pathologic: No stage assigned - Unsigned    INTERVAL HISTORY:  Ms. Oakey 63 y.o. female seen for follow-up of multiple myeloma.  She is taking Ninlaro 1 tablet weekly 3 weeks on 1 week off.  She has on and off numbness in the right hand which is stable.  No other numbness reported.  No fevers or night sweats or weight loss.  She is continuing acyclovir twice daily.  She also takes hydrochlorothiazide 25 mg daily.  Denies any ER visits or hospitalizations.  Denies any nausea, vomiting, diarrhea or constipation.  No bleeding per rectum or melena noted.  Appetite is 100% and energy levels are 75%.  She reported pain in bilateral knees from arthritis, 7 out of 10.   REVIEW OF SYSTEMS:  Review of Systems  Neurological: Positive for numbness.  All other systems reviewed and are negative.    PAST MEDICAL/SURGICAL HISTORY:  Past Medical  History:  Diagnosis Date  . Anemia   . Arthritis   . Cancer ([place])   . Claustrophobia 10/05/2014  . Hypertension   . Leukopenia 08/03/2014  . Normocytic hypochromic anemia 08/03/2014  . Renal disorder    stage 3    Past Surgical History:  Procedure Laterality Date  . ABDOMINAL HYSTERECTOMY    . CARDIAC SURGERY    . OTHER SURGICAL HISTORY     heart surgery as infant to "repair hole in heart"     SOCIAL HISTORY:  Social History   Socioeconomic History  . Marital status: Legally Separated    Spouse name: Not on file  . Number of children: Not on file  . Years of education: Not on file  . Highest education level: Not on file  Occupational History  . Not on file  Social Needs  . Financial resource strain: Not on file  . Food insecurity    Worry: Not on file    Inability: Not on file  . Transportation needs    Medical: Not on file    Non-medical: Not on file  Tobacco Use  . Smoking status: Never Smoker  . Smokeless tobacco: Never Used  Substance and Sexual Activity  . Alcohol use: No  . Drug use: No  . Sexual activity: Not on file    Comment: divorced- 2 daughters  Lifestyle  . Physical activity    Days per week: Not on file    Minutes per session: Not on file  . Stress: Not on file  Relationships  . Social Herbalist on phone: Not on file    Gets together: Not on file    Attends religious service: Not on file    Active member of club or organization: Not on file    Attends meetings of clubs or organizations: Not on file    Relationship status: Not on file  . Intimate partner violence    Fear of current or ex partner: Not on file    Emotionally abused: Not on file    Physically abused: Not on file    Forced sexual activity: Not on file  Other Topics Concern  . Not on file  Social History Narrative  . Not on file    FAMILY HISTORY:  Family History  Problem Relation Age of Onset  . Cancer Mother   . Hypertension Mother   . Cancer Father   .  Hypertension Father   . Cancer Maternal Grandmother   . Hypertension Sister   . Hypertension Brother     CURRENT MEDICATIONS:  Outpatient Encounter Medications as of 08/26/2018  Medication Sig  . acetaminophen (TYLENOL) 325 MG tablet Take by mouth.  Marland Kitchen acyclovir (ZOVIRAX) 800 MG tablet Take 1 tablet (800 mg total) by mouth 2 (two) times daily.  . Ascorbic Acid (VITAMIN C) 1000 MG tablet Take by mouth.  . cholecalciferol (VITAMIN D) 1000 units tablet Take 1,000 Units by mouth daily.  . hydrochlorothiazide (HYDRODIURIL) 25 MG tablet TAKE 1 TABLET BY MOUTH ONCE DAILY.  . hydrocortisone 2.5 % ointment   .  ixazomib citrate (NINLARO) 3 MG capsule Take 1 capsule (35m) by mouth once a week on days 1, 8 & 15 every 28 day cycle. Take on empty stomach 1 hr before or 2 hrs after food  . Multiple Vitamin (TAB-A-VITE) TABS TAKE 1 TABLET BY MOUTH ONCE DAILY.   Facility-Administered Encounter Medications as of 08/26/2018  Medication  . heparin lock flush 100 unit/mL  . sodium chloride 0.9 % injection 10 mL    ALLERGIES:  No Known Allergies   PHYSICAL EXAM:  ECOG Performance status: 1  Vitals:   08/26/18 0819  BP: 135/79  Pulse: 89  Resp: 18  Temp: 98.7 F (37.1 C)  SpO2: 98%   Filed Weights   08/26/18 0819  Weight: (!) 344 lb (156 kg)    Physical Exam Vitals signs reviewed.  Constitutional:      Appearance: Normal appearance.  Cardiovascular:     Rate and Rhythm: Normal rate and regular rhythm.     Heart sounds: Normal heart sounds.  Pulmonary:     Effort: Pulmonary effort is normal.     Breath sounds: Normal breath sounds.  Abdominal:     General: There is no distension.     Palpations: Abdomen is soft. There is no mass.  Musculoskeletal:        General: No swelling.  Skin:    General: Skin is warm.  Neurological:     General: No focal deficit present.     Mental Status: She is alert and oriented to person, place, and time.  Psychiatric:        Mood and Affect: Mood  normal.        Behavior: Behavior normal.      LABORATORY DATA:  I have reviewed the labs as listed.  CBC    Component Value Date/Time   WBC 5.3 08/19/2018 0904   RBC 3.66 (L) 08/19/2018 0904   HGB 11.0 (L) 08/19/2018 0904   HCT 34.4 (L) 08/19/2018 0904   PLT 197 08/19/2018 0904   MCV 94.0 08/19/2018 0904   MCH 30.1 08/19/2018 0904   MCHC 32.0 08/19/2018 0904   RDW 14.7 08/19/2018 0904   LYMPHSABS 1.3 08/19/2018 0904   MONOABS 0.6 08/19/2018 0904   EOSABS 0.1 08/19/2018 0904   BASOSABS 0.0 08/19/2018 0904   CMP Latest Ref Rng & Units 08/19/2018 07/16/2018 06/11/2018  Glucose 70 - 99 mg/dL 98 97 96  BUN 8 - 23 mg/dL 23 24(H) 22  Creatinine 0.44 - 1.00 mg/dL 1.05(H) 1.15(H) 0.96  Sodium 135 - 145 mmol/L 140 141 140  Potassium 3.5 - 5.1 mmol/L 3.8 4.2 4.0  Chloride 98 - 111 mmol/L 101 104 103  CO2 22 - 32 mmol/L 27 25 29   Calcium 8.9 - 10.3 mg/dL 9.6 10.1 10.0  Total Protein 6.5 - 8.1 g/dL 7.8 8.6(H) 8.4(H)  Total Bilirubin 0.3 - 1.2 mg/dL 0.5 0.5 0.3  Alkaline Phos 38 - 126 U/L 44 53 53  AST 15 - 41 U/L 20 22 24   ALT 0 - 44 U/L 14 13 15        DIAGNOSTIC IMAGING:  I have independently reviewed the scans and discussed with the patient.   I have reviewed AVenita LickLPN's note and agree with the documentation.  I personally performed a face-to-face visit, made revisions and my assessment and plan is as follows.    ASSESSMENT & PLAN:   Multiple myeloma 1.  IgG kappa myeloma with high risk features (del 17 P, -13 q.), stage  II: -Date of diagnosis in August 2016.  Induction treatment with RVD. 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 -Maintenance therapy with Ninlaro on days 1, 8, 15 every 28 days started in March 2018. -He is continuing to tolerate Ninlaro very well without any major GI side effects.  No generalized numbness.  Right hand numbness on and off is stable. - We reviewed myeloma panel from 08/19/2018.  M spike improved to 0.8 from 0.9 g.  Light chain ratio  improved to 2.97 from 3.29.  Kappa light chains improved to 36.8 from 48.  Creatinine also improved to 1.05. - Her creatinine has been elevated for the last 2 times.  She is on hydrochlorothiazide 25 mg.  Hemoglobin is 11. - As her myeloma panel is slightly better, I did not recommend any change of therapy at this time.  We will see her back in 4 weeks with repeat myeloma panel.  If there is any worsening, will consider daratumumab-based regimen.  2.  ID prophylaxis: -She will continue acyclovir twice daily.  3.  Bone strengthening: - She completed zoledronic acid for more than 2 years.  Hence we have discontinued it.  Total time spent is 25 minutes with more than 50% of the time spent face-to-face discussing treatment plan and coordination of care.    Orders placed this encounter:  Orders Placed This Encounter  Procedures  . Protein electrophoresis, serum  . Kappa/lambda light chains  . Lactate dehydrogenase  . CBC with Differential/Platelet  . Comprehensive metabolic panel      Derek Jack, MD [place] [phone]

## 2018-08-26 NOTE — Patient Instructions (Addendum)
[facility] at [facility] Discharge Instructions  You were seen today by Dr. Katragadda. He went over your recent lab results. He will see you back in 4 weeks for labs and follow up.   Thank you for choosing [facility] at [facility] to provide your oncology and hematology care.  To afford each patient quality time with our provider, please arrive at least 15 minutes before your scheduled appointment time.   If you have a lab appointment with the Cancer Center please come in thru the  Main Entrance and check in at the main information desk  You need to re-schedule your appointment should you arrive 10 or more minutes late.  We strive to give you quality time with our providers, and arriving late affects you and other patients whose appointments are after yours.  Also, if you no show three or more times for appointments you may be dismissed from the clinic at the providers discretion.     Again, thank you for choosing [facility].  Our hope is that these requests will decrease the amount of time that you wait before being seen by our physicians.       _____________________________________________________________  Should you have questions after your visit to [facility], please contact our office at [phone] between the hours of 8:00 a.m. and 4:30 p.m.  Voicemails left after 4:00 p.m. will not be returned until the following business day.  For prescription refill requests, have your pharmacy contact our office and allow 72 hours.    Cancer Center Support Programs:   > Cancer Support Group  2nd Tuesday of the month 1pm-2pm, Journey Room    

## 2018-09-01 ENCOUNTER — Other Ambulatory Visit (HOSPITAL_COMMUNITY): Payer: Self-pay | Admitting: Hematology

## 2018-09-01 DIAGNOSIS — C9000 Multiple myeloma not having achieved remission: Secondary | ICD-10-CM

## 2018-09-18 ENCOUNTER — Other Ambulatory Visit: Payer: Self-pay

## 2018-09-18 ENCOUNTER — Inpatient Hospital Stay (HOSPITAL_COMMUNITY): Payer: Medicare Other | Attending: Hematology

## 2018-09-18 DIAGNOSIS — G629 Polyneuropathy, unspecified: Secondary | ICD-10-CM | POA: Diagnosis not present

## 2018-09-18 DIAGNOSIS — R0602 Shortness of breath: Secondary | ICD-10-CM | POA: Diagnosis not present

## 2018-09-18 DIAGNOSIS — Z809 Family history of malignant neoplasm, unspecified: Secondary | ICD-10-CM | POA: Diagnosis not present

## 2018-09-18 DIAGNOSIS — C9000 Multiple myeloma not having achieved remission: Secondary | ICD-10-CM | POA: Insufficient documentation

## 2018-09-18 DIAGNOSIS — R200 Anesthesia of skin: Secondary | ICD-10-CM | POA: Diagnosis not present

## 2018-09-18 DIAGNOSIS — Z9221 Personal history of antineoplastic chemotherapy: Secondary | ICD-10-CM | POA: Insufficient documentation

## 2018-09-18 DIAGNOSIS — Z8249 Family history of ischemic heart disease and other diseases of the circulatory system: Secondary | ICD-10-CM | POA: Diagnosis not present

## 2018-09-18 DIAGNOSIS — M170 Bilateral primary osteoarthritis of knee: Secondary | ICD-10-CM | POA: Insufficient documentation

## 2018-09-18 DIAGNOSIS — Z79899 Other long term (current) drug therapy: Secondary | ICD-10-CM | POA: Insufficient documentation

## 2018-09-18 DIAGNOSIS — M2550 Pain in unspecified joint: Secondary | ICD-10-CM | POA: Diagnosis not present

## 2018-09-18 LAB — COMPREHENSIVE METABOLIC PANEL
ALT: 34 U/L (ref 0–44)
AST: 27 U/L (ref 15–41)
Albumin: 4.1 g/dL (ref 3.5–5.0)
Alkaline Phosphatase: 52 U/L (ref 38–126)
Anion gap: 11 (ref 5–15)
BUN: 23 mg/dL (ref 8–23)
CO2: 28 mmol/L (ref 22–32)
Calcium: 9.4 mg/dL (ref 8.9–10.3)
Chloride: 99 mmol/L (ref 98–111)
Creatinine, Ser: 1.01 mg/dL — ABNORMAL HIGH (ref 0.44–1.00)
GFR calc Af Amer: >60 mL/min (ref >60)
GFR calc non Af Amer: >60 mL/min (ref >60)
Glucose, Bld: 111 mg/dL — ABNORMAL HIGH (ref 70–99)
Potassium: 3.8 mmol/L (ref 3.5–5.1)
Sodium: 138 mmol/L (ref 135–145)
Total Bilirubin: 0.7 mg/dL (ref 0.3–1.2)
Total Protein: 7.8 g/dL (ref 6.5–8.1)

## 2018-09-18 LAB — CBC WITH DIFFERENTIAL/PLATELET
Abs Immature Granulocytes: 0.03 10*3/uL (ref 0.00–0.07)
Basophils Absolute: 0 10*3/uL (ref 0.0–0.1)
Basophils Relative: 0 %
Eosinophils Absolute: 0.1 10*3/uL (ref 0.0–0.5)
Eosinophils Relative: 2 %
HCT: 36.7 % (ref 36.0–46.0)
Hemoglobin: 11.6 g/dL — ABNORMAL LOW (ref 12.0–15.0)
Immature Granulocytes: 1 %
Lymphocytes Relative: 34 %
Lymphs Abs: 2.2 10*3/uL (ref 0.7–4.0)
MCH: 29.8 pg (ref 26.0–34.0)
MCHC: 31.6 g/dL (ref 30.0–36.0)
MCV: 94.3 fL (ref 80.0–100.0)
Monocytes Absolute: 0.4 10*3/uL (ref 0.1–1.0)
Monocytes Relative: 7 %
Neutro Abs: 3.7 10*3/uL (ref 1.7–7.7)
Neutrophils Relative %: 56 %
Platelets: 178 10*3/uL (ref 150–400)
RBC: 3.89 MIL/uL (ref 3.87–5.11)
RDW: 14 % (ref 11.5–15.5)
WBC: 6.5 10*3/uL (ref 4.0–10.5)
nRBC: 0 % (ref 0.0–0.2)

## 2018-09-18 LAB — LACTATE DEHYDROGENASE: LDH: 155 U/L (ref 98–192)

## 2018-09-19 LAB — PROTEIN ELECTROPHORESIS, SERUM
A/G Ratio: 1.1 (ref 0.7–1.7)
Albumin ELP: 3.7 g/dL (ref 2.9–4.4)
Alpha-1-Globulin: 0.2 g/dL (ref 0.0–0.4)
Alpha-2-Globulin: 0.6 g/dL (ref 0.4–1.0)
Beta Globulin: 1 g/dL (ref 0.7–1.3)
Gamma Globulin: 1.6 g/dL (ref 0.4–1.8)
Globulin, Total: 3.4 g/dL (ref 2.2–3.9)
M-Spike, %: 0.8 g/dL — ABNORMAL HIGH
Total Protein ELP: 7.1 g/dL (ref 6.0–8.5)

## 2018-09-19 LAB — KAPPA/LAMBDA LIGHT CHAINS
Kappa free light chain: 40.6 mg/L — ABNORMAL HIGH (ref 3.3–19.4)
Kappa, lambda light chain ratio: 3.83 — ABNORMAL HIGH (ref 0.26–1.65)
Lambda free light chains: 10.6 mg/L (ref 5.7–26.3)

## 2018-09-23 ENCOUNTER — Inpatient Hospital Stay (HOSPITAL_BASED_OUTPATIENT_CLINIC_OR_DEPARTMENT_OTHER): Payer: Medicare Other | Admitting: Hematology

## 2018-09-23 ENCOUNTER — Other Ambulatory Visit: Payer: Self-pay

## 2018-09-23 ENCOUNTER — Encounter (HOSPITAL_COMMUNITY): Payer: Self-pay | Admitting: Hematology

## 2018-09-23 VITALS — BP 126/93 | HR 87 | Temp 97.3°F | Resp 18 | Wt 334.0 lb

## 2018-09-23 DIAGNOSIS — C9000 Multiple myeloma not having achieved remission: Secondary | ICD-10-CM

## 2018-09-23 NOTE — Assessment & Plan Note (Signed)
1.  IgG kappa myeloma with high risk features (del 17 P, -13 q.), stage II: - Diagnosed in August 2016, induction treatment with RVD. 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 - Maintenance Ninlaro on days 1, 8, 15 every 28 days started in March 2018. - We reviewed multiple myeloma panel from 09/18/2018.  M spike is stable at 0.8 g.  Light chain ratio has gone up to 3.83.  Kappa light chains have gone from 36.8 to 40.6.  Light chain ratio prior to that was 3.29. - Creatinine is also stable.  Calcium is normal. - We will continue Ninlaro at this time.  If her M spike increases towards 1 g, will consider switching to daratumumab-based regimen.  2.  ID prophylaxis: -She will continue acyclovir twice daily.  She was asked to start back on 81 mg of aspirin.  3.  Bone strengthening: -She completed zoledronic acid for more than 2 years.  Hence it was discontinued.  4.  Neuropathy: -She has numbness in the right hand fingertips on and off which is stable.

## 2018-09-23 NOTE — Patient Instructions (Addendum)
[place] at [facility] Discharge Instructions  You were seen today by Dr. Delton Coombes. He went over your recent labs results. He will see you back in 4 weeks for labs and follow up.  START taking the 81mg  aspirin again daily.  Thank you for choosing [place] at [facility] to provide your oncology and hematology care.  To afford each patient quality time with our provider, please arrive at least 15 minutes before your scheduled appointment time.   If you have a lab appointment with the [facility] please come in thru the  Main Entrance and check in at the main information desk  You need to re-schedule your appointment should you arrive 10 or more minutes late.  We strive to give you quality time with our providers, and arriving late affects you and other patients whose appointments are after yours.  Also, if you no show three or more times for appointments you may be dismissed from the clinic at the providers discretion.     Again, thank you for choosing [facility].  Our hope is that these requests will decrease the amount of time that you wait before being seen by our physicians.       _____________________________________________________________  Should you have questions after your visit to [facility], please contact our office at [phone] between the hours of 8:00 a.m. and 4:30 p.m.  Voicemails left after 4:00 p.m. will not be returned until the following business day.  For prescription refill requests, have your pharmacy contact our office and allow 72 hours.    Cancer Center Support Programs:   > Cancer Support Group  2nd Tuesday of the month 1pm-2pm, Journey Room

## 2018-09-23 NOTE — Progress Notes (Signed)
Kelli Hurley, [place] [address]   CLINIC:  Medical Oncology/Hematology  PCP:  Abran Richard, MD [address] [place] [address] [phone]   REASON FOR VISIT:  Follow-up for multiple myeloma   BRIEF ONCOLOGIC HISTORY:  Oncology History  Multiple myeloma ([place])  08/07/2014 Imaging   Bone Survey- No lytic lesions are noted in the visualized skeleton.   09/03/2014 Bone Marrow Biopsy   NORMOCELLULAR BONE MARROW FOR AGE WITH PLASMA CELL NEOPLASM.  The plasma cell component is increased in the marrow representing an estimated 18% of all cells. Cytogenetics with 13q-, 17p- (high risk disease)   09/03/2014 Pathology Results   Cytogenetics with 13q-, 17p- (high risk disease)   09/23/2014 Initial Diagnosis   Multiple myeloma   09/30/2014 PET scan   No abnormal hypermetabolism in the neck, chest, abdomen or pelvis.   10/05/2014 - 03/22/2015 Chemotherapy   RVD   10/28/2014 Treatment Plan Change   Issues related to getting Revlimid in a timely fashion, therefore, she received her Revlimid on 9/21 resulting in a 12 day cycle this time instead of a 14 day cycle   11/02/2014 Imaging   CTA chest- No evidence for a large or central pulmonary embolism as described.  8 mm density along the right minor fissure could represent focal pleural thickening but indeterminate. If the patient is at high risk for bronchogenic carcinoma, follow-up c   11/23/2014 Miscellaneous   Zometa 4 mg IV monthly   04/07/2015 Bone Marrow Biopsy   Normocellular marrow with 2-4% clonal plasma cells by immunohistochemistry. FISH and cytogenetics were normal [facility])     04/2015 Miscellaneous   PRETRANSPLANT EVALUATION:  Pulmonary function tests: FEV1 100.3% / DLCO 97.9%  Echocardiogram: Normal LV function with EF 60-65%    04/27/2015 Procedure   Stem cell mobilization with filgrastim and Mozobil [facility])   05/06/2015 Miscellaneous   BMT conditioning regimen with high-dose  Melphalan given [employer] [facility], [name]); Day -1   05/07/2015 Bone Marrow Transplant   Outpatient autologous stem cell transplant [facility], [name]); Day 0   05/18/2015 Miscellaneous   WBC engraftment;  did not require platelet transfusion during her transplant process. Lowest platelet count 28,000    05/20/2015 Procedure   Tunneled catheter removed [facility])   09/08/2015 -  Chemotherapy   Velcade every 2 weeks   12/15/2015 Miscellaneous   Zometa re-instituted.    12/23/2015 Imaging   Bone density- BMD as determined from Forearm Radius 33% is 0.799 g/cm2 with a T-Score of 1.2. This patient is considered normal according to [facility]) criteria.   04/17/2016 Miscellaneous   Started maintenance with Ninlaro.   05/04/2016 Treatment Plan Change   Zometa switched to Xgeva injection monthly given difficult IV access.       CANCER STAGING: Cancer Staging Multiple myeloma [facility]) Staging form: Multiple Myeloma, AJCC 6th Edition - Clinical stage from 10/26/2014: Stage IIA - Signed by Baird Cancer, PA-C on 10/26/2014 - Pathologic: No stage assigned - Unsigned    INTERVAL HISTORY:  Kelli Hurley 63 y.o. female seen for follow-up of multiple myeloma.  She is taking Ninlaro 1 tablet 3 weeks on, one-week off.  She has mild diarrhea which is self-contained.  Appetite is 100%.  Energy levels are 75%.  Pain in the knees from arthritis is reported as 8 out of 10.  She did have right ankle swelling which improves with hydrochlorothiazide.  Shortness of breath on exertion is also stable.  Numbness in the right hand fingertips is also stable and on and off.  No fevers or infections.  Denies any nausea or vomiting or constipation.  Denies any ER visits or hospitalizations.   REVIEW OF SYSTEMS:  Review of Systems  Respiratory: Positive for shortness of breath.   Musculoskeletal: Positive for arthralgias.  Neurological: Positive for numbness.  All other systems reviewed  and are negative.    PAST MEDICAL/SURGICAL HISTORY:  Past Medical History:  Diagnosis Date  . Anemia   . Arthritis   . Cancer ([place])   . Claustrophobia 10/05/2014  . Hypertension   . Leukopenia 08/03/2014  . Normocytic hypochromic anemia 08/03/2014  . Renal disorder    stage 3    Past Surgical History:  Procedure Laterality Date  . ABDOMINAL HYSTERECTOMY    . CARDIAC SURGERY    . OTHER SURGICAL HISTORY     heart surgery as infant to "repair hole in heart"     SOCIAL HISTORY:  Social History   Socioeconomic History  . Marital status: Legally Separated    Spouse name: Not on file  . Number of children: Not on file  . Years of education: Not on file  . Highest education level: Not on file  Occupational History  . Not on file  Social Needs  . Financial resource strain: Not on file  . Food insecurity    Worry: Not on file    Inability: Not on file  . Transportation needs    Medical: Not on file    Non-medical: Not on file  Tobacco Use  . Smoking status: Never Smoker  . Smokeless tobacco: Never Used  Substance and Sexual Activity  . Alcohol use: No  . Drug use: No  . Sexual activity: Not on file    Comment: divorced- 2 daughters  Lifestyle  . Physical activity    Days per week: Not on file    Minutes per session: Not on file  . Stress: Not on file  Relationships  . Social Herbalist on phone: Not on file    Gets together: Not on file    Attends religious service: Not on file    Active member of club or organization: Not on file    Attends meetings of clubs or organizations: Not on file    Relationship status: Not on file  . Intimate partner violence    Fear of current or ex partner: Not on file    Emotionally abused: Not on file    Physically abused: Not on file    Forced sexual activity: Not on file  Other Topics Concern  . Not on file  Social History Narrative  . Not on file    FAMILY HISTORY:  Family History  Problem Relation Age of  Onset  . Cancer Mother   . Hypertension Mother   . Cancer Father   . Hypertension Father   . Cancer Maternal Grandmother   . Hypertension Sister   . Hypertension Brother     CURRENT MEDICATIONS:  Outpatient Encounter Medications as of 09/23/2018  Medication Sig  . acyclovir (ZOVIRAX) 800 MG tablet Take 1 tablet (800 mg total) by mouth 2 (two) times daily.  . Ascorbic Acid (VITAMIN C) 1000 MG tablet Take 1,000 mg by mouth daily.   . cholecalciferol (VITAMIN D) 1000 units tablet Take 1,000 Units by mouth daily.  . hydrochlorothiazide (HYDRODIURIL) 25 MG tablet TAKE 1 TABLET BY MOUTH ONCE DAILY.  . hydrocortisone 2.5 %  ointment   . ixazomib citrate (NINLARO) 3 MG capsule Take 1 capsule (25m) by mouth once a week on days 1, 8 & 15 every 28 day cycle. Take on empty stomach 1 hr before or 2 hrs after food  . Multiple Vitamin (TAB-A-VITE) TABS TAKE 1 TABLET BY MOUTH ONCE DAILY.  . [DISCONTINUED] acetaminophen (TYLENOL) 325 MG tablet Take by mouth.  . [DISCONTINUED] acyclovir (ZOVIRAX) 800 MG tablet    Facility-Administered Encounter Medications as of 09/23/2018  Medication  . heparin lock flush 100 unit/mL  . sodium chloride 0.9 % injection 10 mL    ALLERGIES:  Allergies  Allergen Reactions  . Diclofenac Swelling    Per pt, facial swelling     PHYSICAL EXAM:  ECOG Performance status: 1  Vitals:   09/23/18 1049  BP: (!) 126/93  Pulse: 87  Resp: 18  Temp: (!) 97.3 F (36.3 C)  SpO2: 100%   Filed Weights   09/23/18 1049  Weight: (!) 334 lb (151.5 kg)    Physical Exam Vitals signs reviewed.  Constitutional:      Appearance: Normal appearance.  Cardiovascular:     Rate and Rhythm: Normal rate and regular rhythm.     Heart sounds: Normal heart sounds.  Pulmonary:     Effort: Pulmonary effort is normal.     Breath sounds: Normal breath sounds.  Abdominal:     General: There is no distension.     Palpations: Abdomen is soft. There is no mass.  Musculoskeletal:         General: No swelling.  Skin:    General: Skin is warm.  Neurological:     General: No focal deficit present.     Mental Status: She is alert and oriented to person, place, and time.  Psychiatric:        Mood and Affect: Mood normal.        Behavior: Behavior normal.      LABORATORY DATA:  I have reviewed the labs as listed.  CBC    Component Value Date/Time   WBC 6.5 09/18/2018 1127   RBC 3.89 09/18/2018 1127   HGB 11.6 (L) 09/18/2018 1127   HCT 36.7 09/18/2018 1127   PLT 178 09/18/2018 1127   MCV 94.3 09/18/2018 1127   MCH 29.8 09/18/2018 1127   MCHC 31.6 09/18/2018 1127   RDW 14.0 09/18/2018 1127   LYMPHSABS 2.2 09/18/2018 1127   MONOABS 0.4 09/18/2018 1127   EOSABS 0.1 09/18/2018 1127   BASOSABS 0.0 09/18/2018 1127   CMP Latest Ref Rng & Units 09/18/2018 08/19/2018 07/16/2018  Glucose 70 - 99 mg/dL 111(H) 98 97  BUN 8 - 23 mg/dL 23 23 24(H)  Creatinine 0.44 - 1.00 mg/dL 1.01(H) 1.05(H) 1.15(H)  Sodium 135 - 145 mmol/L 138 140 141  Potassium 3.5 - 5.1 mmol/L 3.8 3.8 4.2  Chloride 98 - 111 mmol/L 99 101 104  CO2 22 - 32 mmol/L _0 Calcium 8.9 - 10.3 mg/dL 9.4 9.6 10.1  Total Protein 6.5 - 8.1 g/dL 7.8 7.8 8.6(H)  Total Bilirubin 0.3 - 1.2 mg/dL 0.7 0.5 0.5  Alkaline Phos 38 - 126 U/L 52 44 53  AST 15 - 41 U/L _1 ALT 0 - 44 U/L 34 14 13       DIAGNOSTIC IMAGING:  I have independently reviewed the scans and discussed with the patient.   I have reviewed AVenita LickLPN's note and agree with the documentation.  I personally performed  a face-to-face visit, made revisions and my assessment and plan is as follows.    ASSESSMENT & PLAN:   Multiple myeloma 1.  IgG kappa myeloma with high risk features (del 17 P, -13 q.), stage II: - Diagnosed in August 2016, induction treatment with RVD. 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 - Maintenance Ninlaro on days 1, 8, 15 every 28 days started in March 2018. - We reviewed multiple myeloma panel from  09/18/2018.  M spike is stable at 0.8 g.  Light chain ratio has gone up to 3.83.  Kappa light chains have gone from 36.8 to 40.6.  Light chain ratio prior to that was 3.29. - Creatinine is also stable.  Calcium is normal. - We will continue Ninlaro at this time.  If her M spike increases towards 1 g, will consider switching to daratumumab-based regimen.  2.  ID prophylaxis: -She will continue acyclovir twice daily.  She was asked to start back on 81 mg of aspirin.  3.  Bone strengthening: -She completed zoledronic acid for more than 2 years.  Hence it was discontinued.  4.  Neuropathy: -She has numbness in the right hand fingertips on and off which is stable.  Total time spent is 25 minutes with more than 50% of the time spent face-to-face discussing treatment plan, counseling and coordination of care.  Orders placed this encounter:  Orders Placed This Encounter  Procedures  . CBC with Differential/Platelet  . Comprehensive metabolic panel  . Protein electrophoresis, serum  . Kappa/lambda light chains  . Lactate dehydrogenase      Derek Jack, MD [place] [phone]

## 2018-09-30 ENCOUNTER — Telehealth: Payer: Self-pay | Admitting: Orthopedic Surgery

## 2018-09-30 ENCOUNTER — Other Ambulatory Visit: Payer: Self-pay | Admitting: Orthopedic Surgery

## 2018-09-30 DIAGNOSIS — M1710 Unilateral primary osteoarthritis, unspecified knee: Secondary | ICD-10-CM

## 2018-09-30 MED ORDER — IBUPROFEN 400 MG PO TABS: 400.0000 mg | ORAL_TABLET | Freq: Three times a day (TID) | ORAL | 0 refills | Status: DC | PRN

## 2018-09-30 NOTE — Telephone Encounter (Signed)
Kelli Hurley called and stated that the Diclofenac that Dr. Aline Brochure gave her for her knee made her face swell.  She would like to get something else sent to [facility]

## 2018-10-15 ENCOUNTER — Other Ambulatory Visit (HOSPITAL_COMMUNITY): Payer: Medicare Other

## 2018-10-16 ENCOUNTER — Other Ambulatory Visit: Payer: Self-pay

## 2018-10-16 ENCOUNTER — Inpatient Hospital Stay (HOSPITAL_COMMUNITY): Payer: Medicare Other | Attending: Hematology

## 2018-10-16 DIAGNOSIS — Z809 Family history of malignant neoplasm, unspecified: Secondary | ICD-10-CM | POA: Diagnosis not present

## 2018-10-16 DIAGNOSIS — R1110 Vomiting, unspecified: Secondary | ICD-10-CM | POA: Diagnosis not present

## 2018-10-16 DIAGNOSIS — M2550 Pain in unspecified joint: Secondary | ICD-10-CM | POA: Diagnosis not present

## 2018-10-16 DIAGNOSIS — Z79899 Other long term (current) drug therapy: Secondary | ICD-10-CM | POA: Diagnosis not present

## 2018-10-16 DIAGNOSIS — G629 Polyneuropathy, unspecified: Secondary | ICD-10-CM | POA: Diagnosis not present

## 2018-10-16 DIAGNOSIS — C9000 Multiple myeloma not having achieved remission: Secondary | ICD-10-CM | POA: Insufficient documentation

## 2018-10-16 DIAGNOSIS — R200 Anesthesia of skin: Secondary | ICD-10-CM | POA: Diagnosis not present

## 2018-10-16 DIAGNOSIS — Z9221 Personal history of antineoplastic chemotherapy: Secondary | ICD-10-CM | POA: Diagnosis not present

## 2018-10-16 DIAGNOSIS — Z8249 Family history of ischemic heart disease and other diseases of the circulatory system: Secondary | ICD-10-CM | POA: Insufficient documentation

## 2018-10-16 DIAGNOSIS — Z7982 Long term (current) use of aspirin: Secondary | ICD-10-CM | POA: Diagnosis not present

## 2018-10-16 LAB — COMPREHENSIVE METABOLIC PANEL
ALT: 102 U/L — ABNORMAL HIGH (ref 0–44)
AST: 52 U/L — ABNORMAL HIGH (ref 15–41)
Albumin: 4.1 g/dL (ref 3.5–5.0)
Alkaline Phosphatase: 81 U/L (ref 38–126)
Anion gap: 10 (ref 5–15)
BUN: 20 mg/dL (ref 8–23)
CO2: 27 mmol/L (ref 22–32)
Calcium: 9.3 mg/dL (ref 8.9–10.3)
Chloride: 101 mmol/L (ref 98–111)
Creatinine, Ser: 0.91 mg/dL (ref 0.44–1.00)
GFR calc Af Amer: >60 mL/min (ref >60)
GFR calc non Af Amer: >60 mL/min (ref >60)
Glucose, Bld: 97 mg/dL (ref 70–99)
Potassium: 4 mmol/L (ref 3.5–5.1)
Sodium: 138 mmol/L (ref 135–145)
Total Bilirubin: 0.7 mg/dL (ref 0.3–1.2)
Total Protein: 8 g/dL (ref 6.5–8.1)

## 2018-10-16 LAB — CBC WITH DIFFERENTIAL/PLATELET
Abs Immature Granulocytes: 0.02 10*3/uL (ref 0.00–0.07)
Basophils Absolute: 0 10*3/uL (ref 0.0–0.1)
Basophils Relative: 0 %
Eosinophils Absolute: 0.1 10*3/uL (ref 0.0–0.5)
Eosinophils Relative: 1 %
HCT: 34.7 % — ABNORMAL LOW (ref 36.0–46.0)
Hemoglobin: 11.1 g/dL — ABNORMAL LOW (ref 12.0–15.0)
Immature Granulocytes: 0 %
Lymphocytes Relative: 30 %
Lymphs Abs: 1.6 10*3/uL (ref 0.7–4.0)
MCH: 30.3 pg (ref 26.0–34.0)
MCHC: 32 g/dL (ref 30.0–36.0)
MCV: 94.8 fL (ref 80.0–100.0)
Monocytes Absolute: 0.5 10*3/uL (ref 0.1–1.0)
Monocytes Relative: 10 %
Neutro Abs: 3 10*3/uL (ref 1.7–7.7)
Neutrophils Relative %: 59 %
Platelets: 152 10*3/uL (ref 150–400)
RBC: 3.66 MIL/uL — ABNORMAL LOW (ref 3.87–5.11)
RDW: 14.3 % (ref 11.5–15.5)
WBC: 5.2 10*3/uL (ref 4.0–10.5)
nRBC: 0 % (ref 0.0–0.2)

## 2018-10-16 LAB — LACTATE DEHYDROGENASE: LDH: 183 U/L (ref 98–192)

## 2018-10-17 LAB — PROTEIN ELECTROPHORESIS, SERUM
A/G Ratio: 1.1 (ref 0.7–1.7)
Albumin ELP: 4 g/dL (ref 2.9–4.4)
Alpha-1-Globulin: 0.2 g/dL (ref 0.0–0.4)
Alpha-2-Globulin: 0.8 g/dL (ref 0.4–1.0)
Beta Globulin: 0.9 g/dL (ref 0.7–1.3)
Gamma Globulin: 1.7 g/dL (ref 0.4–1.8)
Globulin, Total: 3.6 g/dL (ref 2.2–3.9)
M-Spike, %: 0.7 g/dL — ABNORMAL HIGH
Total Protein ELP: 7.6 g/dL (ref 6.0–8.5)

## 2018-10-17 LAB — KAPPA/LAMBDA LIGHT CHAINS
Kappa free light chain: 37.6 mg/L — ABNORMAL HIGH (ref 3.3–19.4)
Kappa, lambda light chain ratio: 2.76 — ABNORMAL HIGH (ref 0.26–1.65)
Lambda free light chains: 13.6 mg/L (ref 5.7–26.3)

## 2018-10-18 ENCOUNTER — Other Ambulatory Visit (HOSPITAL_COMMUNITY): Payer: Self-pay | Admitting: Hematology

## 2018-10-18 DIAGNOSIS — C9000 Multiple myeloma not having achieved remission: Secondary | ICD-10-CM

## 2018-10-21 ENCOUNTER — Other Ambulatory Visit: Payer: Self-pay

## 2018-10-21 ENCOUNTER — Encounter (HOSPITAL_COMMUNITY): Payer: Self-pay | Admitting: Hematology

## 2018-10-21 ENCOUNTER — Inpatient Hospital Stay (HOSPITAL_BASED_OUTPATIENT_CLINIC_OR_DEPARTMENT_OTHER): Payer: Medicare Other | Admitting: Hematology

## 2018-10-21 VITALS — BP 136/75 | HR 88 | Temp 97.5°F | Resp 16 | Wt 334.0 lb

## 2018-10-21 DIAGNOSIS — R110 Nausea: Secondary | ICD-10-CM

## 2018-10-21 DIAGNOSIS — C9000 Multiple myeloma not having achieved remission: Secondary | ICD-10-CM | POA: Diagnosis not present

## 2018-10-21 MED ORDER — PROCHLORPERAZINE MALEATE 10 MG PO TABS: 10.0000 mg | ORAL_TABLET | Freq: Four times a day (QID) | ORAL | 2 refills | Status: DC | PRN

## 2018-10-21 NOTE — Assessment & Plan Note (Signed)
1.  IgG kappa myeloma with high risk features (del 17 P, -13 q.), stage II: -Diagnosed in August 2016, induction treatment with RVD. 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 -Maintenance Ninlaro on days 1, 8, 15 every 28 days started in March 2018. - She is tolerating Ninlaro very well without any diarrhea.  She does report occasional vomiting and nausea.  We have sent a prescription for Compazine. - We reviewed myeloma panel from 10/16/2018.  M spike has improved to 0.7 g (0.8 g previously).  Free light chain ratio has also improved to 2.76 (previously 3.83).  Kappa light chains are 37.6. - She will continue Ninlaro.  Her AST and ALT are mildly elevated.  We will closely monitor it. -She will come back in 1 month for follow-up with repeat labs.  2.  ID prophylaxis: -She will continue acyclovir twice daily.  She will also continue aspirin 81 mg daily.  3.  Bone strengthening: -She completed zoledronic acid for more than 2 years.  Hence it was discontinued.  4.  Neuropathy: -She has numbness in the right hand fingertips and the right foot on and off.  This is stable.

## 2018-10-21 NOTE — Progress Notes (Signed)
[place] [place], [place] [address]   CLINIC:  Medical Oncology/Hematology  PCP:  Abran Richard, MD [address] [place] [address] [phone]   REASON FOR VISIT:  Follow-up for multiple myeloma   BRIEF ONCOLOGIC HISTORY:  Oncology History  Multiple myeloma ([place])  08/07/2014 Imaging   Bone Survey- No lytic lesions are noted in the visualized skeleton.   09/03/2014 Bone Marrow Biopsy   NORMOCELLULAR BONE MARROW FOR AGE WITH PLASMA CELL NEOPLASM.  The plasma cell component is increased in the marrow representing an estimated 18% of all cells. Cytogenetics with 13q-, 17p- (high risk disease)   09/03/2014 Pathology Results   Cytogenetics with 13q-, 17p- (high risk disease)   09/23/2014 Initial Diagnosis   Multiple myeloma   09/30/2014 PET scan   No abnormal hypermetabolism in the neck, chest, abdomen or pelvis.   10/05/2014 - 03/22/2015 Chemotherapy   RVD   10/28/2014 Treatment Plan Change   Issues related to getting Revlimid in a timely fashion, therefore, she received her Revlimid on 9/21 resulting in a 12 day cycle this time instead of a 14 day cycle   11/02/2014 Imaging   CTA chest- No evidence for a large or central pulmonary embolism as described.  8 mm density along the right minor fissure could represent focal pleural thickening but indeterminate. If the patient is at high risk for bronchogenic carcinoma, follow-up c   11/23/2014 Miscellaneous   Zometa 4 mg IV monthly   04/07/2015 Bone Marrow Biopsy   Normocellular marrow with 2-4% clonal plasma cells by immunohistochemistry. FISH and cytogenetics were normal [facility])     04/2015 Miscellaneous   PRETRANSPLANT EVALUATION:  Pulmonary function tests: FEV1 100.3% / DLCO 97.9%  Echocardiogram: Normal LV function with EF 60-65%    04/27/2015 Procedure   Stem cell mobilization with filgrastim and Mozobil [facility])   05/06/2015 Miscellaneous   BMT conditioning regimen with high-dose  Melphalan given [facility], [name]); Day -1   05/07/2015 Bone Marrow Transplant   Outpatient autologous stem cell transplant [facility], [name]); Day 0   05/18/2015 Miscellaneous   WBC engraftment;  did not require platelet transfusion during her transplant process. Lowest platelet count 28,000    05/20/2015 Procedure   Tunneled catheter removed [facility])   09/08/2015 -  Chemotherapy   Velcade every 2 weeks   12/15/2015 Miscellaneous   Zometa re-instituted.    12/23/2015 Imaging   Bone density- BMD as determined from Forearm Radius 33% is 0.799 g/cm2 with a T-Score of 1.2. This patient is considered normal according to [facility]) criteria.   04/17/2016 Miscellaneous   Started maintenance with Ninlaro.   05/04/2016 Treatment Plan Change   Zometa switched to Xgeva injection monthly given difficult IV access.       CANCER STAGING: Cancer Staging Multiple myeloma [facility]) Staging form: Multiple Myeloma, AJCC 6th Edition - Clinical stage from 10/26/2014: Stage IIA - Signed by Baird Cancer, PA-C on 10/26/2014 - Pathologic: No stage assigned - Unsigned    INTERVAL HISTORY:  Ms. Pagliarulo 63 y.o. female seen for follow-up of multiple myeloma.  She is taking Ninlaro weekly, 3 weeks on 1 week off.  Denies any diarrhea.  Had few episodes of vomiting.  Numbness in the right hand and right foot is stable.  Appetite is 100%.  Energy levels are 75%.  No new pains reported.  Denies any fevers, night sweats or weight loss.  Denies any diarrhea or constipation.  No  ER visits or hospitalizations.   REVIEW OF SYSTEMS:  Review of Systems  Gastrointestinal: Positive for vomiting.  Musculoskeletal: Positive for arthralgias.  Neurological: Positive for numbness.  All other systems reviewed and are negative.    PAST MEDICAL/SURGICAL HISTORY:  Past Medical History:  Diagnosis Date  . Anemia   . Arthritis   . Cancer ([place])   . Claustrophobia 10/05/2014  .  Hypertension   . Leukopenia 08/03/2014  . Normocytic hypochromic anemia 08/03/2014  . Renal disorder    stage 3    Past Surgical History:  Procedure Laterality Date  . ABDOMINAL HYSTERECTOMY    . CARDIAC SURGERY    . OTHER SURGICAL HISTORY     heart surgery as infant to "repair hole in heart"     SOCIAL HISTORY:  Social History   Socioeconomic History  . Marital status: Legally Separated    Spouse name: Not on file  . Number of children: Not on file  . Years of education: Not on file  . Highest education level: Not on file  Occupational History  . Not on file  Social Needs  . Financial resource strain: Not on file  . Food insecurity    Worry: Not on file    Inability: Not on file  . Transportation needs    Medical: Not on file    Non-medical: Not on file  Tobacco Use  . Smoking status: Never Smoker  . Smokeless tobacco: Never Used  Substance and Sexual Activity  . Alcohol use: No  . Drug use: No  . Sexual activity: Not on file    Comment: divorced- 2 daughters  Lifestyle  . Physical activity    Days per week: Not on file    Minutes per session: Not on file  . Stress: Not on file  Relationships  . Social Herbalist on phone: Not on file    Gets together: Not on file    Attends religious service: Not on file    Active member of club or organization: Not on file    Attends meetings of clubs or organizations: Not on file    Relationship status: Not on file  . Intimate partner violence    Fear of current or ex partner: Not on file    Emotionally abused: Not on file    Physically abused: Not on file    Forced sexual activity: Not on file  Other Topics Concern  . Not on file  Social History Narrative  . Not on file    FAMILY HISTORY:  Family History  Problem Relation Age of Onset  . Cancer Mother   . Hypertension Mother   . Cancer Father   . Hypertension Father   . Cancer Maternal Grandmother   . Hypertension Sister   . Hypertension Brother      CURRENT MEDICATIONS:  Outpatient Encounter Medications as of 10/21/2018  Medication Sig  . acyclovir (ZOVIRAX) 800 MG tablet Take 1 tablet (800 mg total) by mouth 2 (two) times daily.  . Ascorbic Acid (VITAMIN C) 1000 MG tablet Take 1,000 mg by mouth daily.   . cholecalciferol (VITAMIN D) 1000 units tablet Take 1,000 Units by mouth daily.  . hydrochlorothiazide (HYDRODIURIL) 25 MG tablet TAKE 1 TABLET BY MOUTH ONCE DAILY.  . hydrocortisone 2.5 % ointment   . ibuprofen (ADVIL) 400 MG tablet Take 1 tablet (400 mg total) by mouth every 8 (eight) hours as needed.  . Multiple Vitamin (TAB-A-VITE) TABS TAKE 1 TABLET  BY MOUTH ONCE DAILY.  Kennieth Rad 3 MG capsule Take 1 capsule (73m) by mouth once a week on days 1, 8 & 15 every 28 day cycle. Take on empty stomach 1 hr before or 2 hrs after food  . prochlorperazine (COMPAZINE) 10 MG tablet Take 1 tablet (10 mg total) by mouth every 6 (six) hours as needed for nausea or vomiting.   Facility-Administered Encounter Medications as of 10/21/2018  Medication  . heparin lock flush 100 unit/mL  . sodium chloride 0.9 % injection 10 mL    ALLERGIES:  Allergies  Allergen Reactions  . Diclofenac Swelling    Per pt, facial swelling     PHYSICAL EXAM:  ECOG Performance status: 1  Vitals:   10/21/18 1059  BP: 136/75  Pulse: 88  Resp: 16  Temp: (!) 97.5 F (36.4 C)  SpO2: 97%   Filed Weights   10/21/18 1059  Weight: (!) 334 lb (151.5 kg)    Physical Exam Vitals signs reviewed.  Constitutional:      Appearance: Normal appearance.  Cardiovascular:     Rate and Rhythm: Normal rate and regular rhythm.     Heart sounds: Normal heart sounds.  Pulmonary:     Effort: Pulmonary effort is normal.     Breath sounds: Normal breath sounds.  Abdominal:     General: There is no distension.     Palpations: Abdomen is soft. There is no mass.  Musculoskeletal:        General: No swelling.  Skin:    General: Skin is warm.  Neurological:      General: No focal deficit present.     Mental Status: She is alert and oriented to person, place, and time.  Psychiatric:        Mood and Affect: Mood normal.        Behavior: Behavior normal.      LABORATORY DATA:  I have reviewed the labs as listed.  CBC    Component Value Date/Time   WBC 5.2 10/16/2018 1000   RBC 3.66 (L) 10/16/2018 1000   HGB 11.1 (L) 10/16/2018 1000   HCT 34.7 (L) 10/16/2018 1000   PLT 152 10/16/2018 1000   MCV 94.8 10/16/2018 1000   MCH 30.3 10/16/2018 1000   MCHC 32.0 10/16/2018 1000   RDW 14.3 10/16/2018 1000   LYMPHSABS 1.6 10/16/2018 1000   MONOABS 0.5 10/16/2018 1000   EOSABS 0.1 10/16/2018 1000   BASOSABS 0.0 10/16/2018 1000   CMP Latest Ref Rng & Units 10/16/2018 09/18/2018 08/19/2018  Glucose 70 - 99 mg/dL 97 111(H) 98  BUN 8 - 23 mg/dL 20 23 23   Creatinine 0.44 - 1.00 mg/dL 0.91 1.01(H) 1.05(H)  Sodium 135 - 145 mmol/L 138 138 140  Potassium 3.5 - 5.1 mmol/L 4.0 3.8 3.8  Chloride 98 - 111 mmol/L 101 99 101  CO2 22 - 32 mmol/L 27 28 27   Calcium 8.9 - 10.3 mg/dL 9.3 9.4 9.6  Total Protein 6.5 - 8.1 g/dL 8.0 7.8 7.8  Total Bilirubin 0.3 - 1.2 mg/dL 0.7 0.7 0.5  Alkaline Phos 38 - 126 U/L 81 52 44  AST 15 - 41 U/L 52(H) 27 20  ALT 0 - 44 U/L 102(H) 34 14       DIAGNOSTIC IMAGING:  I have independently reviewed the scans and discussed with the patient.   I have reviewed AVenita LickLPN's note and agree with the documentation.  I personally performed a face-to-face visit, made revisions and my  assessment and plan is as follows.    ASSESSMENT & PLAN:   Multiple myeloma 1.  IgG kappa myeloma with high risk features (del 17 P, -13 q.), stage II: -Diagnosed in August 2016, induction treatment with RVD. 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 -Maintenance Ninlaro on days 1, 8, 15 every 28 days started in March 2018. - She is tolerating Ninlaro very well without any diarrhea.  She does report occasional vomiting and nausea.  We have  sent a prescription for Compazine. - We reviewed myeloma panel from 10/16/2018.  M spike has improved to 0.7 g (0.8 g previously).  Free light chain ratio has also improved to 2.76 (previously 3.83).  Kappa light chains are 37.6. - She will continue Ninlaro.  Her AST and ALT are mildly elevated.  We will closely monitor it. -She will come back in 1 month for follow-up with repeat labs.  2.  ID prophylaxis: -She will continue acyclovir twice daily.  She will also continue aspirin 81 mg daily.  3.  Bone strengthening: -She completed zoledronic acid for more than 2 years.  Hence it was discontinued.  4.  Neuropathy: -She has numbness in the right hand fingertips and the right foot on and off.  This is stable.  Total time spent is 25 minutes with more than 50% of the time spent face-to-face discussing treatment plan, counseling and coordination of care.  Orders placed this encounter:  Orders Placed This Encounter  Procedures  . CBC with Differential/Platelet  . Comprehensive metabolic panel  . Protein electrophoresis, serum  . Kappa/lambda light chains      Derek Jack, [place] [phone]

## 2018-10-21 NOTE — Patient Instructions (Signed)
[facility] at [facility] Discharge Instructions  You were seen today by Dr. Katragadda. He went over your recent lab results. He will see you back in 4 weeks for labs and follow up.   Thank you for choosing [facility] at [facility] to provide your oncology and hematology care.  To afford each patient quality time with our provider, please arrive at least 15 minutes before your scheduled appointment time.   If you have a lab appointment with the Cancer Center please come in thru the  Main Entrance and check in at the main information desk  You need to re-schedule your appointment should you arrive 10 or more minutes late.  We strive to give you quality time with our providers, and arriving late affects you and other patients whose appointments are after yours.  Also, if you no show three or more times for appointments you may be dismissed from the clinic at the providers discretion.     Again, thank you for choosing [facility].  Our hope is that these requests will decrease the amount of time that you wait before being seen by our physicians.       _____________________________________________________________  Should you have questions after your visit to [facility], please contact our office at [phone] between the hours of 8:00 a.m. and 4:30 p.m.  Voicemails left after 4:00 p.m. will not be returned until the following business day.  For prescription refill requests, have your pharmacy contact our office and allow 72 hours.    Cancer Center Support Programs:   > Cancer Support Group  2nd Tuesday of the month 1pm-2pm, Journey Room    

## 2018-11-13 ENCOUNTER — Other Ambulatory Visit: Payer: Self-pay

## 2018-11-13 ENCOUNTER — Other Ambulatory Visit (HOSPITAL_COMMUNITY): Payer: Medicare Other

## 2018-11-13 ENCOUNTER — Inpatient Hospital Stay (HOSPITAL_COMMUNITY): Payer: Medicare Other | Attending: Hematology

## 2018-11-13 DIAGNOSIS — Z8249 Family history of ischemic heart disease and other diseases of the circulatory system: Secondary | ICD-10-CM | POA: Insufficient documentation

## 2018-11-13 DIAGNOSIS — Z23 Encounter for immunization: Secondary | ICD-10-CM | POA: Diagnosis not present

## 2018-11-13 DIAGNOSIS — C9000 Multiple myeloma not having achieved remission: Secondary | ICD-10-CM | POA: Insufficient documentation

## 2018-11-13 DIAGNOSIS — Z9221 Personal history of antineoplastic chemotherapy: Secondary | ICD-10-CM | POA: Insufficient documentation

## 2018-11-13 DIAGNOSIS — M1990 Unspecified osteoarthritis, unspecified site: Secondary | ICD-10-CM | POA: Diagnosis not present

## 2018-11-13 DIAGNOSIS — R200 Anesthesia of skin: Secondary | ICD-10-CM | POA: Diagnosis not present

## 2018-11-13 DIAGNOSIS — G629 Polyneuropathy, unspecified: Secondary | ICD-10-CM | POA: Insufficient documentation

## 2018-11-13 DIAGNOSIS — Z809 Family history of malignant neoplasm, unspecified: Secondary | ICD-10-CM | POA: Diagnosis not present

## 2018-11-13 DIAGNOSIS — M25431 Effusion, right wrist: Secondary | ICD-10-CM | POA: Diagnosis not present

## 2018-11-13 DIAGNOSIS — R112 Nausea with vomiting, unspecified: Secondary | ICD-10-CM | POA: Insufficient documentation

## 2018-11-13 DIAGNOSIS — R0602 Shortness of breath: Secondary | ICD-10-CM | POA: Diagnosis not present

## 2018-11-13 DIAGNOSIS — M79643 Pain in unspecified hand: Secondary | ICD-10-CM | POA: Insufficient documentation

## 2018-11-13 DIAGNOSIS — Z79899 Other long term (current) drug therapy: Secondary | ICD-10-CM | POA: Diagnosis not present

## 2018-11-13 DIAGNOSIS — M7989 Other specified soft tissue disorders: Secondary | ICD-10-CM | POA: Diagnosis not present

## 2018-11-13 DIAGNOSIS — G479 Sleep disorder, unspecified: Secondary | ICD-10-CM | POA: Diagnosis not present

## 2018-11-13 LAB — COMPREHENSIVE METABOLIC PANEL
ALT: 16 U/L (ref 0–44)
AST: 27 U/L (ref 15–41)
Albumin: 4.4 g/dL (ref 3.5–5.0)
Alkaline Phosphatase: 57 U/L (ref 38–126)
Anion gap: 13 (ref 5–15)
BUN: 23 mg/dL (ref 8–23)
CO2: 26 mmol/L (ref 22–32)
Calcium: 9.9 mg/dL (ref 8.9–10.3)
Chloride: 98 mmol/L (ref 98–111)
Creatinine, Ser: 1.1 mg/dL — ABNORMAL HIGH (ref 0.44–1.00)
GFR calc Af Amer: >60 mL/min (ref >60)
GFR calc non Af Amer: 54 mL/min — ABNORMAL LOW (ref >60)
Glucose, Bld: 116 mg/dL — ABNORMAL HIGH (ref 70–99)
Potassium: 4.2 mmol/L (ref 3.5–5.1)
Sodium: 137 mmol/L (ref 135–145)
Total Bilirubin: 0.4 mg/dL (ref 0.3–1.2)
Total Protein: 8.4 g/dL — ABNORMAL HIGH (ref 6.5–8.1)

## 2018-11-13 LAB — CBC WITH DIFFERENTIAL/PLATELET
Abs Immature Granulocytes: 0.02 10*3/uL (ref 0.00–0.07)
Basophils Absolute: 0 10*3/uL (ref 0.0–0.1)
Basophils Relative: 0 %
Eosinophils Absolute: 0.1 10*3/uL (ref 0.0–0.5)
Eosinophils Relative: 1 %
HCT: 37.4 % (ref 36.0–46.0)
Hemoglobin: 11.7 g/dL — ABNORMAL LOW (ref 12.0–15.0)
Immature Granulocytes: 0 %
Lymphocytes Relative: 33 %
Lymphs Abs: 2.3 10*3/uL (ref 0.7–4.0)
MCH: 29.6 pg (ref 26.0–34.0)
MCHC: 31.3 g/dL (ref 30.0–36.0)
MCV: 94.7 fL (ref 80.0–100.0)
Monocytes Absolute: 0.5 10*3/uL (ref 0.1–1.0)
Monocytes Relative: 8 %
Neutro Abs: 4 10*3/uL (ref 1.7–7.7)
Neutrophils Relative %: 58 %
Platelets: 186 10*3/uL (ref 150–400)
RBC: 3.95 MIL/uL (ref 3.87–5.11)
RDW: 14 % (ref 11.5–15.5)
WBC: 7 10*3/uL (ref 4.0–10.5)
nRBC: 0 % (ref 0.0–0.2)

## 2018-11-14 LAB — PROTEIN ELECTROPHORESIS, SERUM
A/G Ratio: 1.1 (ref 0.7–1.7)
Albumin ELP: 4.1 g/dL (ref 2.9–4.4)
Alpha-1-Globulin: 0.2 g/dL (ref 0.0–0.4)
Alpha-2-Globulin: 0.7 g/dL (ref 0.4–1.0)
Beta Globulin: 1.1 g/dL (ref 0.7–1.3)
Gamma Globulin: 1.8 g/dL (ref 0.4–1.8)
Globulin, Total: 3.8 g/dL (ref 2.2–3.9)
M-Spike, %: 1.1 g/dL — ABNORMAL HIGH
Total Protein ELP: 7.9 g/dL (ref 6.0–8.5)

## 2018-11-14 LAB — KAPPA/LAMBDA LIGHT CHAINS
Kappa free light chain: 43.1 mg/L — ABNORMAL HIGH (ref 3.3–19.4)
Kappa, lambda light chain ratio: 2.73 — ABNORMAL HIGH (ref 0.26–1.65)
Lambda free light chains: 15.8 mg/L (ref 5.7–26.3)

## 2018-11-18 ENCOUNTER — Encounter (HOSPITAL_COMMUNITY): Payer: Self-pay | Admitting: *Deleted

## 2018-11-18 NOTE — Progress Notes (Signed)
Labs from 10/7 faxed to Dr. Arlean Hopping, dentist with [facility] per her request.  Patient having dental extractions today.  Okay to proceed.

## 2018-11-19 ENCOUNTER — Ambulatory Visit (HOSPITAL_COMMUNITY): Payer: Medicare Other | Admitting: Hematology

## 2018-11-21 ENCOUNTER — Other Ambulatory Visit: Payer: Self-pay

## 2018-11-22 ENCOUNTER — Ambulatory Visit (HOSPITAL_COMMUNITY): Payer: Medicare Other

## 2018-11-22 ENCOUNTER — Inpatient Hospital Stay (HOSPITAL_BASED_OUTPATIENT_CLINIC_OR_DEPARTMENT_OTHER): Payer: Medicare Other | Admitting: Nurse Practitioner

## 2018-11-22 ENCOUNTER — Ambulatory Visit (HOSPITAL_COMMUNITY)
Admission: RE | Admit: 2018-11-22 | Discharge: 2018-11-22 | Disposition: A | Discharge status: Home / Self Care | Payer: Medicare Other | Source: Ambulatory Visit | Attending: Nurse Practitioner | Admitting: Nurse Practitioner

## 2018-11-22 ENCOUNTER — Other Ambulatory Visit: Payer: Self-pay

## 2018-11-22 VITALS — BP 116/71 | HR 85 | Temp 98.6°F | Resp 18 | Wt 353.0 lb

## 2018-11-22 DIAGNOSIS — C9001 Multiple myeloma in remission: Secondary | ICD-10-CM

## 2018-11-22 DIAGNOSIS — M7989 Other specified soft tissue disorders: Secondary | ICD-10-CM | POA: Diagnosis not present

## 2018-11-22 DIAGNOSIS — C9000 Multiple myeloma not having achieved remission: Secondary | ICD-10-CM | POA: Diagnosis not present

## 2018-11-22 DIAGNOSIS — Z0000 Encounter for general adult medical examination without abnormal findings: Secondary | ICD-10-CM

## 2018-11-22 MED ORDER — HYDROCODONE-ACETAMINOPHEN 5-325 MG PO TABS: 1.0000 | ORAL_TABLET | Freq: Every evening | ORAL | 0 refills | Status: DC | PRN

## 2018-11-22 MED ORDER — INFLUENZA VAC SPLIT QUAD 0.5 ML IM SUSY
0.5000 mL | PREFILLED_SYRINGE | Freq: Once | INTRAMUSCULAR | Status: AC
  Administered 2018-11-22: 0.5 mL via INTRAMUSCULAR
  Filled 2018-11-22: qty 0.5

## 2018-11-22 NOTE — Assessment & Plan Note (Addendum)
1.  IgG kappa myeloma with high risk features (del 17 P, -13 q.), stage II: -Diagnosed in August 2016, induction treatment with RVD. 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 -Maintenance Ninlaro on days 1, 8, 15 every 28 days started in March 2018. As she is tolerating Ninlaro very well without any diarrhea.  She does report occasional vomiting and nausea.  She has Compazine to help control this now. -Labs on 11/22/2018 showed M spike has increased to 1.1 g/dL (0.7 g/dL as previously).  Free light chain ratio is stable at 2.73.  Kappa light chains have increased to 43.1. -She will continue Ninlaro.  Her AST and ALT have returned back to normal.  We will closely monitor it. -She will come back in 1 month for follow-up and repeat labs.  2.  ID prophylaxis: -She will continue acyclovir twice daily.  She will also continue 81 mg aspirin daily.  3.  Bone strengthening: -She completed zoledronic acid for more than 2 years.  It has been discontinued now.  4.  Neuropathy: -She has numbness in the right hand fingertips and the right foot on and off.  This is stable at this time.  5.  Right hand swelling: -Patient reports she has having right hand and wrist swelling and pain it is keeping her up at night. -We will order ultrasound of the right extremity to rule out DVT. -Ultrasound done on 11/22/2018 showed no evidence of DVT.

## 2018-11-22 NOTE — Patient Instructions (Signed)
[facility] at [facility] Discharge Instructions  Follow up in 1 month with labs    Thank you for choosing [facility] at [facility] to provide your oncology and hematology care.  To afford each patient quality time with our provider, please arrive at least 15 minutes before your scheduled appointment time.   If you have a lab appointment with the Cancer Center please come in thru the Main Entrance and check in at the main information desk.  You need to re-schedule your appointment should you arrive 10 or more minutes late.  We strive to give you quality time with our providers, and arriving late affects you and other patients whose appointments are after yours.  Also, if you no show three or more times for appointments you may be dismissed from the clinic at the providers discretion.     Again, thank you for choosing [facility].  Our hope is that these requests will decrease the amount of time that you wait before being seen by our physicians.       _____________________________________________________________  Should you have questions after your visit to [facility], please contact our office at [phone] between the hours of 8:00 a.m. and 4:30 p.m.  Voicemails left after 4:00 p.m. will not be returned until the following business day.  For prescription refill requests, have your pharmacy contact our office and allow 72 hours.    Due to Covid, you will need to wear a mask upon entering the hospital. If you do not have a mask, a mask will be given to you at the Main Entrance upon arrival. For doctor visits, patients may have 1 support person with them. For treatment visits, patients can not have anyone with them due to social distancing guidelines and our immunocompromised population.      

## 2018-11-22 NOTE — Progress Notes (Signed)
Kelli Hurley, [place] [id number]   CLINIC:  Medical Oncology/Hematology  PCP:  Abran Richard, MD [address] [place] [address] [phone]   REASON FOR VISIT: Follow-up for multiple myeloma  CURRENT THERAPY: Ninlaro  BRIEF ONCOLOGIC HISTORY:  Oncology History  Multiple myeloma ([place])  08/07/2014 Imaging   Bone Survey- No lytic lesions are noted in the visualized skeleton.   09/03/2014 Bone Marrow Biopsy   NORMOCELLULAR BONE MARROW FOR AGE WITH PLASMA CELL NEOPLASM.  The plasma cell component is increased in the marrow representing an estimated 18% of all cells. Cytogenetics with 13q-, 17p- (high risk disease)   09/03/2014 Pathology Results   Cytogenetics with 13q-, 17p- (high risk disease)   09/23/2014 Initial Diagnosis   Multiple myeloma   09/30/2014 PET scan   No abnormal hypermetabolism in the neck, chest, abdomen or pelvis.   10/05/2014 - 03/22/2015 Chemotherapy   RVD   10/28/2014 Treatment Plan Change   Issues related to getting Revlimid in a timely fashion, therefore, she received her Revlimid on 9/21 resulting in a 12 day cycle this time instead of a 14 day cycle   11/02/2014 Imaging   CTA chest- No evidence for a large or central pulmonary embolism as described.  8 mm density along the right minor fissure could represent focal pleural thickening but indeterminate. If the patient is at high risk for bronchogenic carcinoma, follow-up c   11/23/2014 Miscellaneous   Zometa 4 mg IV monthly   04/07/2015 Bone Marrow Biopsy   Normocellular marrow with 2-4% clonal plasma cells by immunohistochemistry. FISH and cytogenetics were normal [facility])     04/2015 Miscellaneous   PRETRANSPLANT EVALUATION:  Pulmonary function tests: FEV1 100.3% / DLCO 97.9%  Echocardiogram: Normal LV function with EF 60-65%    04/27/2015 Procedure   Stem cell mobilization with filgrastim and Mozobil [facility])   05/06/2015 Miscellaneous   BMT conditioning  regimen with high-dose Melphalan given [facility] [name]); Day -1   05/07/2015 Bone Marrow Transplant   Outpatient autologous stem cell transplant [facility], [name]); Day 0   05/18/2015 Miscellaneous   WBC engraftment;  did not require platelet transfusion during her transplant process. Lowest platelet count 28,000    05/20/2015 Procedure   Tunneled catheter removed [facility])   09/08/2015 -  Chemotherapy   Velcade every 2 weeks   12/15/2015 Miscellaneous   Zometa re-instituted.    12/23/2015 Imaging   Bone density- BMD as determined from Forearm Radius 33% is 0.799 g/cm2 with a T-Score of 1.2. This patient is considered normal according to [facility]) criteria.   04/17/2016 Miscellaneous   Started maintenance with Ninlaro.   05/04/2016 Treatment Plan Change   Zometa switched to Xgeva injection monthly given difficult IV access.       CANCER STAGING: Cancer Staging Multiple myeloma [facility]) Staging form: Multiple Myeloma, AJCC 6th Edition - Clinical stage from 10/26/2014: Stage IIA - Signed by Baird Cancer, PA-C on 10/26/2014 - Pathologic: No stage assigned - Unsigned    INTERVAL HISTORY:  Kelli Hurley 63 y.o. female returns for routine follow-up for multiple myeloma.  Patient reports she has been doing well since her last visit.  She reports she is having some right hand swelling and pain. Denies any nausea, vomiting, or diarrhea. Denies any new pains. Had not noticed any recent bleeding such as epistaxis, hematuria or hematochezia. Denies recent chest pain on exertion, shortness of breath on minimal exertion, pre-syncopal episodes, or  palpitations. Denies any numbness or tingling in hands or feet. Denies any recent fevers, infections, or recent hospitalizations. Patient reports appetite at 100% and energy level at 75%.  She is eating well maintaining her weight is time.    REVIEW OF SYSTEMS:  Review of Systems  Respiratory: Positive for  shortness of breath (With exertion only).   Cardiovascular: Positive for leg swelling.  Neurological: Positive for numbness.  Psychiatric/Behavioral: Positive for sleep disturbance.  All other systems reviewed and are negative.    PAST MEDICAL/SURGICAL HISTORY:  Past Medical History:  Diagnosis Date  . Anemia   . Arthritis   . Cancer ([place])   . Claustrophobia 10/05/2014  . Hypertension   . Leukopenia 08/03/2014  . Normocytic hypochromic anemia 08/03/2014  . Renal disorder    stage 3    Past Surgical History:  Procedure Laterality Date  . ABDOMINAL HYSTERECTOMY    . CARDIAC SURGERY    . OTHER SURGICAL HISTORY     heart surgery as infant to "repair hole in heart"     SOCIAL HISTORY:  Social History   Socioeconomic History  . Marital status: Legally Separated    Spouse name: Not on file  . Number of children: Not on file  . Years of education: Not on file  . Highest education level: Not on file  Occupational History  . Not on file  Social Needs  . Financial resource strain: Not on file  . Food insecurity    Worry: Not on file    Inability: Not on file  . Transportation needs    Medical: Not on file    Non-medical: Not on file  Tobacco Use  . Smoking status: Never Smoker  . Smokeless tobacco: Never Used  Substance and Sexual Activity  . Alcohol use: No  . Drug use: No  . Sexual activity: Not on file    Comment: divorced- 2 daughters  Lifestyle  . Physical activity    Days per week: Not on file    Minutes per session: Not on file  . Stress: Not on file  Relationships  . Social Herbalist on phone: Not on file    Gets together: Not on file    Attends religious service: Not on file    Active member of club or organization: Not on file    Attends meetings of clubs or organizations: Not on file    Relationship status: Not on file  . Intimate partner violence    Fear of current or ex partner: Not on file    Emotionally abused: Not on file     Physically abused: Not on file    Forced sexual activity: Not on file  Other Topics Concern  . Not on file  Social History Narrative  . Not on file    FAMILY HISTORY:  Family History  Problem Relation Age of Onset  . Cancer Mother   . Hypertension Mother   . Cancer Father   . Hypertension Father   . Cancer Maternal Grandmother   . Hypertension Sister   . Hypertension Brother     CURRENT MEDICATIONS:  Outpatient Encounter Medications as of 11/22/2018  Medication Sig  . acyclovir (ZOVIRAX) 800 MG tablet Take 1 tablet (800 mg total) by mouth 2 (two) times daily.  Marland Kitchen amoxicillin (AMOXIL) 500 MG capsule take 1 capsule by oral route every 8 hours (3 times a day) until all pills gone  . Ascorbic Acid (VITAMIN C) 1000 MG tablet  Take 1,000 mg by mouth daily.   . chlorhexidine (PAROEX) 0.12 % solution starting 11/20/18, rinse mouth with 10 mL 3 times every day (after meals), swish in mouth for 30 seconds then spit out  . cholecalciferol (VITAMIN D) 1000 units tablet Take 1,000 Units by mouth daily.  . hydrochlorothiazide (HYDRODIURIL) 25 MG tablet TAKE 1 TABLET BY MOUTH ONCE DAILY.  . Multiple Vitamin (TAB-A-VITE) TABS TAKE 1 TABLET BY MOUTH ONCE DAILY.  Kennieth Rad 3 MG capsule Take 1 capsule (20m) by mouth once a week on days 1, 8 & 15 every 28 day cycle. Take on empty stomach 1 hr before or 2 hrs after food  . HYDROcodone-acetaminophen (NORCO) 5-325 MG tablet Take 1 tablet by mouth at bedtime as needed for moderate pain.  .Marland Kitchenibuprofen (ADVIL) 400 MG tablet Take 1 tablet (400 mg total) by mouth every 8 (eight) hours as needed. (Patient not taking: Reported on 11/22/2018)  . prochlorperazine (COMPAZINE) 10 MG tablet Take 1 tablet (10 mg total) by mouth every 6 (six) hours as needed for nausea or vomiting. (Patient not taking: Reported on 11/22/2018)  . [DISCONTINUED] hydrocortisone 2.5 % ointment    Facility-Administered Encounter Medications as of 11/22/2018  Medication  . heparin lock  flush 100 unit/mL  . [COMPLETED] influenza vac split quadrivalent PF (FLUARIX) injection 0.5 mL  . sodium chloride 0.9 % injection 10 mL    ALLERGIES:  Allergies  Allergen Reactions  . Diclofenac Swelling    Per pt, facial swelling     PHYSICAL EXAM:  ECOG Performance status: 1  Vitals:   11/22/18 1013  BP: 116/71  Pulse: 85  Resp: 18  Temp: 98.6 F (37 C)  SpO2: 96%   Filed Weights   11/22/18 1013  Weight: (!) 353 lb (160.1 kg)    Physical Exam Constitutional:      Appearance: Normal appearance. She is normal weight.  Cardiovascular:     Rate and Rhythm: Normal rate and regular rhythm.     Heart sounds: Normal heart sounds.  Pulmonary:     Effort: Pulmonary effort is normal.     Breath sounds: Normal breath sounds.  Abdominal:     General: Bowel sounds are normal.     Palpations: Abdomen is soft.  Musculoskeletal: Normal range of motion.  Skin:    General: Skin is warm.  Neurological:     Mental Status: She is alert and oriented to person, place, and time. Mental status is at baseline.  Psychiatric:        Mood and Affect: Mood normal.        Behavior: Behavior normal.        Thought Content: Thought content normal.        Judgment: Judgment normal.      LABORATORY DATA:  I have reviewed the labs as listed.  CBC    Component Value Date/Time   WBC 7.0 11/13/2018 1305   RBC 3.95 11/13/2018 1305   HGB 11.7 (L) 11/13/2018 1305   HCT 37.4 11/13/2018 1305   PLT 186 11/13/2018 1305   MCV 94.7 11/13/2018 1305   MCH 29.6 11/13/2018 1305   MCHC 31.3 11/13/2018 1305   RDW 14.0 11/13/2018 1305   LYMPHSABS 2.3 11/13/2018 1305   MONOABS 0.5 11/13/2018 1305   EOSABS 0.1 11/13/2018 1305   BASOSABS 0.0 11/13/2018 1305   CMP Latest Ref Rng & Units 11/13/2018 10/16/2018 09/18/2018  Glucose 70 - 99 mg/dL 116(H) 97 111(H)  BUN 8 - 23 mg/dL  23 20 23   Creatinine 0.44 - 1.00 mg/dL 1.10(H) 0.91 1.01(H)  Sodium 135 - 145 mmol/L 137 138 138  Potassium 3.5 - 5.1  mmol/L 4.2 4.0 3.8  Chloride 98 - 111 mmol/L 98 101 99  CO2 22 - 32 mmol/L 26 27 28   Calcium 8.9 - 10.3 mg/dL 9.9 9.3 9.4  Total Protein 6.5 - 8.1 g/dL 8.4(H) 8.0 7.8  Total Bilirubin 0.3 - 1.2 mg/dL 0.4 0.7 0.7  Alkaline Phos 38 - 126 U/L 57 81 52  AST 15 - 41 U/L 27 52(H) 27  ALT 0 - 44 U/L 16 102(H) 34     I personally performed a face-to-face visit.  All questions were answered to patient's stated satisfaction. Encouraged patient to call with any new concerns or questions before his next visit to the cancer center and we can certain see him sooner, if needed.     ASSESSMENT & PLAN:   Multiple myeloma 1.  IgG kappa myeloma with high risk features (del 17 P, -13 q.), stage II: -Diagnosed in August 2016, induction treatment with RVD. 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 -Maintenance Ninlaro on days 1, 8, 15 every 28 days started in March 2018. As she is tolerating Ninlaro very well without any diarrhea.  She does report occasional vomiting and nausea.  She has Compazine to help control this now. -Labs on 11/22/2018 showed M spike has increased to 1.1 g/dL (0.7 g/dL as previously).  Free light chain ratio is stable at 2.73.  Kappa light chains have increased to 43.1. -She will continue Ninlaro.  Her AST and ALT have returned back to normal.  We will closely monitor it. -She will come back in 1 month for follow-up and repeat labs.  2.  ID prophylaxis: -She will continue acyclovir twice daily.  She will also continue 81 mg aspirin daily.  3.  Bone strengthening: -She completed zoledronic acid for more than 2 years.  It has been discontinued now.  4.  Neuropathy: -She has numbness in the right hand fingertips and the right foot on and off.  This is stable at this time.  5.  Right hand swelling: -Patient reports she has having right hand and wrist swelling and pain it is keeping her up at night. -We will order ultrasound of the right extremity to rule out DVT. -Ultrasound done  on 11/22/2018 showed no evidence of DVT.      Orders placed this encounter:  Orders Placed This Encounter  Procedures  . US Venous Img Upper Uni Right  . Lactate dehydrogenase  . Protein electrophoresis, serum  . Kappa/lambda light chains  . CBC with Differential/Platelet  . Comprehensive metabolic panel      Francene Finders, FNP-C [place] [phone]

## 2018-12-16 ENCOUNTER — Inpatient Hospital Stay (HOSPITAL_COMMUNITY): Payer: Medicare Other | Attending: Hematology

## 2018-12-23 ENCOUNTER — Other Ambulatory Visit: Payer: Self-pay

## 2018-12-23 ENCOUNTER — Ambulatory Visit (HOSPITAL_COMMUNITY): Payer: Medicare Other | Admitting: Hematology

## 2018-12-23 ENCOUNTER — Inpatient Hospital Stay (HOSPITAL_COMMUNITY): Payer: Medicare Other | Attending: Hematology

## 2018-12-23 DIAGNOSIS — C9001 Multiple myeloma in remission: Secondary | ICD-10-CM | POA: Diagnosis present

## 2018-12-23 LAB — COMPREHENSIVE METABOLIC PANEL
ALT: 13 U/L (ref 0–44)
AST: 21 U/L (ref 15–41)
Albumin: 4.3 g/dL (ref 3.5–5.0)
Alkaline Phosphatase: 54 U/L (ref 38–126)
Anion gap: 11 (ref 5–15)
BUN: 25 mg/dL — ABNORMAL HIGH (ref 8–23)
CO2: 26 mmol/L (ref 22–32)
Calcium: 10 mg/dL (ref 8.9–10.3)
Chloride: 99 mmol/L (ref 98–111)
Creatinine, Ser: 1.14 mg/dL — ABNORMAL HIGH (ref 0.44–1.00)
GFR calc Af Amer: 60 mL/min — ABNORMAL LOW (ref >60)
GFR calc non Af Amer: 51 mL/min — ABNORMAL LOW (ref >60)
Glucose, Bld: 111 mg/dL — ABNORMAL HIGH (ref 70–99)
Potassium: 3.9 mmol/L (ref 3.5–5.1)
Sodium: 136 mmol/L (ref 135–145)
Total Bilirubin: 0.6 mg/dL (ref 0.3–1.2)
Total Protein: 8.4 g/dL — ABNORMAL HIGH (ref 6.5–8.1)

## 2018-12-23 LAB — CBC WITH DIFFERENTIAL/PLATELET
Abs Immature Granulocytes: 0.02 10*3/uL (ref 0.00–0.07)
Basophils Absolute: 0 10*3/uL (ref 0.0–0.1)
Basophils Relative: 0 %
Eosinophils Absolute: 0.1 10*3/uL (ref 0.0–0.5)
Eosinophils Relative: 1 %
HCT: 36.2 % (ref 36.0–46.0)
Hemoglobin: 11.4 g/dL — ABNORMAL LOW (ref 12.0–15.0)
Immature Granulocytes: 0 %
Lymphocytes Relative: 32 %
Lymphs Abs: 2.1 10*3/uL (ref 0.7–4.0)
MCH: 29.9 pg (ref 26.0–34.0)
MCHC: 31.5 g/dL (ref 30.0–36.0)
MCV: 95 fL (ref 80.0–100.0)
Monocytes Absolute: 0.6 10*3/uL (ref 0.1–1.0)
Monocytes Relative: 8 %
Neutro Abs: 3.8 10*3/uL (ref 1.7–7.7)
Neutrophils Relative %: 59 %
Platelets: 242 10*3/uL (ref 150–400)
RBC: 3.81 MIL/uL — ABNORMAL LOW (ref 3.87–5.11)
RDW: 14.4 % (ref 11.5–15.5)
WBC: 6.5 10*3/uL (ref 4.0–10.5)
nRBC: 0 % (ref 0.0–0.2)

## 2018-12-23 LAB — LACTATE DEHYDROGENASE: LDH: 161 U/L (ref 98–192)

## 2018-12-24 LAB — PROTEIN ELECTROPHORESIS, SERUM
A/G Ratio: 1 (ref 0.7–1.7)
Albumin ELP: 3.6 g/dL (ref 2.9–4.4)
Alpha-1-Globulin: 0.2 g/dL (ref 0.0–0.4)
Alpha-2-Globulin: 0.7 g/dL (ref 0.4–1.0)
Beta Globulin: 1 g/dL (ref 0.7–1.3)
Gamma Globulin: 1.8 g/dL (ref 0.4–1.8)
Globulin, Total: 3.7 g/dL (ref 2.2–3.9)
M-Spike, %: 0.9 g/dL — ABNORMAL HIGH
Total Protein ELP: 7.3 g/dL (ref 6.0–8.5)

## 2018-12-24 LAB — KAPPA/LAMBDA LIGHT CHAINS
Kappa free light chain: 38.9 mg/L — ABNORMAL HIGH (ref 3.3–19.4)
Kappa, lambda light chain ratio: 2.56 — ABNORMAL HIGH (ref 0.26–1.65)
Lambda free light chains: 15.2 mg/L (ref 5.7–26.3)

## 2018-12-30 ENCOUNTER — Other Ambulatory Visit: Payer: Self-pay

## 2018-12-30 ENCOUNTER — Inpatient Hospital Stay (HOSPITAL_BASED_OUTPATIENT_CLINIC_OR_DEPARTMENT_OTHER): Payer: Medicare Other | Admitting: Hematology

## 2018-12-30 DIAGNOSIS — C9000 Multiple myeloma not having achieved remission: Secondary | ICD-10-CM | POA: Diagnosis not present

## 2018-12-30 NOTE — Progress Notes (Signed)
I connected with Corrin Parker on 12/30/18 at  9:50 AM EST by telephone visit and verified that I am speaking with the correct person using two identifiers.   I discussed the limitations, risks, security and privacy concerns of performing an evaluation and management service by telemedicine and the availability of in-person appointments. I also discussed with the patient that there may be a patient responsible charge related to this service. The patient expressed understanding and agreed to proceed.     Patient's location: Home  Provider's location: [facility]   Chief Complaint: Multiple Myeloma   HPI:  Lufkin reports overall doing well since her last visit.  She denies any significant fatigue.  She is able to complete all ADLs independently.  She reports bilateral upper extremity neuropathy is stable to improved.  No change in bowel habits.  Appetite is stable.  No weight loss.  No new bony pains.  We will discuss most recent lab reports.   No physical exam was performed.  Assessment and plan: 1. IgG kappa myeloma with high risk features (del 17 P, -13 q.), stage II: -Diagnosed in August 2016, induction treatment with RVD. 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 -Maintenance Ninlaro on days 1, 8, 15 every 28 days started in March 2018. -As she is tolerating Ninlaro very well without any diarrhea.  She does report occasional vomiting and nausea.  She has Compazine to help control this now. -Most recent multiple myeloma labs reveal stable M spike 0.9 previously 1.1 g/dL.  Kappa light chains 38.9 previously 43.1, lambda light chains 15.2 previously 15.8, and ratio 2.56 previously 2.73. -Recommend patient continue Ninlaro.  Plan to repeat labs in 1 month with office visit..   2.  ID prophylaxis: -She will continue acyclovir twice daily.  She will also continue 81 mg aspirin daily.  3.  Bone strengthening: -She completed zoledronic acid for more than 2 years.  It has been discontinued  now.  4.  Neuropathy: -She has numbness in the right hand fingertips and the right foot on and off.  This is stable at this time.

## 2019-01-20 ENCOUNTER — Other Ambulatory Visit (HOSPITAL_COMMUNITY): Payer: Self-pay | Admitting: Hematology

## 2019-01-20 MED ORDER — GABAPENTIN 300 MG PO CAPS: 300.0000 mg | ORAL_CAPSULE | Freq: Three times a day (TID) | ORAL | 3 refills | Status: DC

## 2019-01-24 ENCOUNTER — Other Ambulatory Visit (HOSPITAL_COMMUNITY): Payer: Self-pay | Admitting: Emergency Medicine

## 2019-01-24 DIAGNOSIS — C9001 Multiple myeloma in remission: Secondary | ICD-10-CM

## 2019-01-27 ENCOUNTER — Inpatient Hospital Stay (HOSPITAL_COMMUNITY): Payer: Medicare Other | Attending: Hematology

## 2019-01-27 ENCOUNTER — Other Ambulatory Visit: Payer: Self-pay

## 2019-01-27 DIAGNOSIS — M25431 Effusion, right wrist: Secondary | ICD-10-CM | POA: Diagnosis not present

## 2019-01-27 DIAGNOSIS — G629 Polyneuropathy, unspecified: Secondary | ICD-10-CM | POA: Diagnosis not present

## 2019-01-27 DIAGNOSIS — M7989 Other specified soft tissue disorders: Secondary | ICD-10-CM | POA: Diagnosis not present

## 2019-01-27 DIAGNOSIS — Z79899 Other long term (current) drug therapy: Secondary | ICD-10-CM | POA: Diagnosis not present

## 2019-01-27 DIAGNOSIS — Z7982 Long term (current) use of aspirin: Secondary | ICD-10-CM | POA: Insufficient documentation

## 2019-01-27 DIAGNOSIS — C9001 Multiple myeloma in remission: Secondary | ICD-10-CM

## 2019-01-27 DIAGNOSIS — C9000 Multiple myeloma not having achieved remission: Secondary | ICD-10-CM | POA: Insufficient documentation

## 2019-01-27 DIAGNOSIS — R112 Nausea with vomiting, unspecified: Secondary | ICD-10-CM | POA: Insufficient documentation

## 2019-01-27 LAB — CBC WITH DIFFERENTIAL/PLATELET
Abs Immature Granulocytes: 0.01 10*3/uL (ref 0.00–0.07)
Basophils Absolute: 0 10*3/uL (ref 0.0–0.1)
Basophils Relative: 0 %
Eosinophils Absolute: 0.1 10*3/uL (ref 0.0–0.5)
Eosinophils Relative: 2 %
HCT: 34.7 % — ABNORMAL LOW (ref 36.0–46.0)
Hemoglobin: 11.1 g/dL — ABNORMAL LOW (ref 12.0–15.0)
Immature Granulocytes: 0 %
Lymphocytes Relative: 29 %
Lymphs Abs: 1.2 10*3/uL (ref 0.7–4.0)
MCH: 30.2 pg (ref 26.0–34.0)
MCHC: 32 g/dL (ref 30.0–36.0)
MCV: 94.6 fL (ref 80.0–100.0)
Monocytes Absolute: 0.5 10*3/uL (ref 0.1–1.0)
Monocytes Relative: 11 %
Neutro Abs: 2.4 10*3/uL (ref 1.7–7.7)
Neutrophils Relative %: 58 %
Platelets: 163 10*3/uL (ref 150–400)
RBC: 3.67 MIL/uL — ABNORMAL LOW (ref 3.87–5.11)
RDW: 14.3 % (ref 11.5–15.5)
WBC: 4.2 10*3/uL (ref 4.0–10.5)
nRBC: 0 % (ref 0.0–0.2)

## 2019-01-27 LAB — COMPREHENSIVE METABOLIC PANEL
ALT: 18 U/L (ref 0–44)
AST: 26 U/L (ref 15–41)
Albumin: 3.9 g/dL (ref 3.5–5.0)
Alkaline Phosphatase: 47 U/L (ref 38–126)
Anion gap: 11 (ref 5–15)
BUN: 28 mg/dL — ABNORMAL HIGH (ref 8–23)
CO2: 27 mmol/L (ref 22–32)
Calcium: 9.9 mg/dL (ref 8.9–10.3)
Chloride: 99 mmol/L (ref 98–111)
Creatinine, Ser: 1.16 mg/dL — ABNORMAL HIGH (ref 0.44–1.00)
GFR calc Af Amer: 58 mL/min — ABNORMAL LOW (ref >60)
GFR calc non Af Amer: 50 mL/min — ABNORMAL LOW (ref >60)
Glucose, Bld: 102 mg/dL — ABNORMAL HIGH (ref 70–99)
Potassium: 4.1 mmol/L (ref 3.5–5.1)
Sodium: 137 mmol/L (ref 135–145)
Total Bilirubin: 0.5 mg/dL (ref 0.3–1.2)
Total Protein: 8.1 g/dL (ref 6.5–8.1)

## 2019-01-27 LAB — LACTATE DEHYDROGENASE: LDH: 190 U/L (ref 98–192)

## 2019-01-28 LAB — PROTEIN ELECTROPHORESIS, SERUM
A/G Ratio: 1.1 (ref 0.7–1.7)
Albumin ELP: 4 g/dL (ref 2.9–4.4)
Alpha-1-Globulin: 0.2 g/dL (ref 0.0–0.4)
Alpha-2-Globulin: 0.7 g/dL (ref 0.4–1.0)
Beta Globulin: 1.1 g/dL (ref 0.7–1.3)
Gamma Globulin: 1.8 g/dL (ref 0.4–1.8)
Globulin, Total: 3.7 g/dL (ref 2.2–3.9)
M-Spike, %: 1 g/dL — ABNORMAL HIGH
Total Protein ELP: 7.7 g/dL (ref 6.0–8.5)

## 2019-01-28 LAB — KAPPA/LAMBDA LIGHT CHAINS
Kappa free light chain: 42 mg/L — ABNORMAL HIGH (ref 3.3–19.4)
Kappa, lambda light chain ratio: 2.69 — ABNORMAL HIGH (ref 0.26–1.65)
Lambda free light chains: 15.6 mg/L (ref 5.7–26.3)

## 2019-02-03 ENCOUNTER — Inpatient Hospital Stay (HOSPITAL_BASED_OUTPATIENT_CLINIC_OR_DEPARTMENT_OTHER): Payer: Medicare Other | Admitting: Hematology

## 2019-02-03 ENCOUNTER — Other Ambulatory Visit: Payer: Self-pay

## 2019-02-03 DIAGNOSIS — C9001 Multiple myeloma in remission: Secondary | ICD-10-CM | POA: Diagnosis not present

## 2019-02-04 NOTE — Assessment & Plan Note (Addendum)
1.  IgG kappa myeloma with high risk features (del 17 P, -13 q.), stage II: -Diagnosed in August 2016, induction treatment with RVD. 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 -Maintenance Ninlaro on days 1, 8, 15 every 28 days started in March 2018. As she is tolerating Ninlaro very well without any diarrhea.  She does report occasional vomiting and nausea.  She has Compazine to help control this now. -Labs on 11/22/2018 showed M spike has increased to 1.1 g/dL (0.7 g/dL as previously).  Free light chain ratio is stable at 2.73.  Kappa light chains have increased to 43.1. -His recent labs from January 27, 2019 revealed M spike of 1.0, kappa light chain elevated at 42.0, lambda light chain 15.6, and a ratio of 2.69. -There is concern for disease progression.  When I trend out her M spike, M spike got as low as 0.2 g/dL post transplant in 2017.  Her M spike has now doubled since transplant.  Again patient has high risk disease with deletion of 17 P.  Would like the patient to follow-up with Dr. Norma Fredrickson for recommendations. -Explained to patient she will need to complete staging work-up with PET/CT, bone marrow biopsy, myeloma labs, 24-hour urine.   -She will return to clinic in 2 weeks.  2.  ID prophylaxis: -She will continue acyclovir twice daily.  She will also continue 81 mg aspirin daily.  3.  Bone strengthening: -She completed zoledronic acid for more than 2 years.  It has been discontinued now.  4.  Neuropathy: -She has numbness in the right hand fingertips and the right foot on and off.   -She was started on gabapentin.  She states this is improved neuropathy.  5.  Right hand swelling: -Patient reports she has having right hand and wrist swelling and pain it is keeping her up at night. -We will order ultrasound of the right extremity to rule out DVT. -Ultrasound done on 11/22/2018 showed no evidence of DVT.

## 2019-02-04 NOTE — Progress Notes (Signed)
I connected with Corrin Parker on 02/04/19 at  9:10 AM EST by telephone visit and verified that I am speaking with the correct person using two identifiers.   I discussed the limitations, risks, security and privacy concerns of performing an evaluation and management service by telemedicine and the availability of in-person appointments. I also discussed with the patient that there may be a patient responsible charge related to this service. The patient expressed understanding and agreed to proceed.      Chief Complaint: Multiple Myeloma  Current therapy: Maintenance Ninlaro  HPI: Ms. Abid reports overall doing well.  She denies any significant fatigue.  She was recently started on gabapentin for peripheral neuropathy that was disrupting her sleep.  Patient states neuropathy is greatly improved.  Denies any change in bowel habits.  Currently on maintenance Ninlaro, tolerating well.  Denies any new bony pains.  We contacted today via phone to discuss most recent labs.  Assessment and plan: 1.  IgG kappa myeloma with high risk features (del 17 P, -13 q.), stage II: -Diagnosed in August 2016, induction treatment with RVD. 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 -Maintenance Ninlaro on days 1, 8, 15 every 28 days started in March 2018. As she is tolerating Ninlaro very well without any diarrhea.  She does report occasional vomiting and nausea.  She has Compazine to help control this now. -Labs on 11/22/2018 showed M spike has increased to 1.1 g/dL (0.7 g/dL as previously).  Free light chain ratio is stable at 2.73.  Kappa light chains have increased to 43.1. -His recent labs from January 27, 2019 revealed M spike of 1.0, kappa light chain elevated at 42.0, lambda light chain 15.6, and a ratio of 2.69. -There is concern for disease progression.  When I trend out her M spike, M spike got as low as 0.2 g/dL post transplant in 2017.  Her M spike has now doubled since transplant.  Again  patient has high risk disease with deletion of 17 P.  Would like the patient to follow-up with Dr. Norma Fredrickson for recommendations. -Explained to patient she will need to complete staging work-up with PET/CT, bone marrow biopsy, myeloma labs, 24-hour urine.   -She will return to clinic in 2 weeks.  2.  ID prophylaxis: -She will continue acyclovir twice daily.  She will also continue 81 mg aspirin daily.  3.  Bone strengthening: -She completed zoledronic acid for more than 2 years.  It has been discontinued now.  4.  Neuropathy: -She has numbness in the right hand fingertips and the right foot on and off.   -She was started on gabapentin.  She states this is improved neuropathy.  5.  Right hand swelling: -Patient reports she has having right hand and wrist swelling and pain it is keeping her up at night. -We will order ultrasound of the right extremity to rule out DVT. -Ultrasound done on 11/22/2018 showed no evidence of DVT.

## 2019-02-05 ENCOUNTER — Other Ambulatory Visit (HOSPITAL_COMMUNITY): Payer: Self-pay | Admitting: Hematology

## 2019-02-05 DIAGNOSIS — C9001 Multiple myeloma in remission: Secondary | ICD-10-CM

## 2019-02-05 MED ORDER — DIAZEPAM 5 MG PO TABS: 5.0000 mg | ORAL_TABLET | Freq: Four times a day (QID) | ORAL | 0 refills | Status: DC | PRN

## 2019-02-10 ENCOUNTER — Other Ambulatory Visit: Payer: Self-pay

## 2019-02-10 ENCOUNTER — Encounter (HOSPITAL_COMMUNITY)
Admission: RE | Admit: 2019-02-10 | Discharge: 2019-02-10 | Disposition: A | Discharge status: Home / Self Care | Payer: Medicare Other | Source: Ambulatory Visit | Attending: Hematology | Admitting: Hematology

## 2019-02-10 DIAGNOSIS — C9001 Multiple myeloma in remission: Secondary | ICD-10-CM | POA: Diagnosis present

## 2019-02-10 MED ORDER — FLUDEOXYGLUCOSE F - 18 (FDG) INJECTION
17.8000 | Freq: Once | INTRAVENOUS | Status: AC | PRN
  Administered 2019-02-10: 17.8 via INTRAVENOUS

## 2019-02-12 ENCOUNTER — Inpatient Hospital Stay (HOSPITAL_COMMUNITY): Payer: Medicare Other | Attending: Hematology | Admitting: Hematology

## 2019-02-12 ENCOUNTER — Other Ambulatory Visit: Payer: Self-pay

## 2019-02-12 VITALS — BP 141/61 | HR 92 | Temp 97.8°F | Resp 20 | Wt 358.0 lb

## 2019-02-12 DIAGNOSIS — Z809 Family history of malignant neoplasm, unspecified: Secondary | ICD-10-CM | POA: Diagnosis not present

## 2019-02-12 DIAGNOSIS — R5383 Other fatigue: Secondary | ICD-10-CM | POA: Diagnosis not present

## 2019-02-12 DIAGNOSIS — M1990 Unspecified osteoarthritis, unspecified site: Secondary | ICD-10-CM | POA: Insufficient documentation

## 2019-02-12 DIAGNOSIS — G629 Polyneuropathy, unspecified: Secondary | ICD-10-CM | POA: Insufficient documentation

## 2019-02-12 DIAGNOSIS — Z79899 Other long term (current) drug therapy: Secondary | ICD-10-CM | POA: Diagnosis not present

## 2019-02-12 DIAGNOSIS — M25431 Effusion, right wrist: Secondary | ICD-10-CM | POA: Insufficient documentation

## 2019-02-12 DIAGNOSIS — C9000 Multiple myeloma not having achieved remission: Secondary | ICD-10-CM

## 2019-02-12 DIAGNOSIS — M2550 Pain in unspecified joint: Secondary | ICD-10-CM | POA: Insufficient documentation

## 2019-02-12 DIAGNOSIS — R0602 Shortness of breath: Secondary | ICD-10-CM | POA: Insufficient documentation

## 2019-02-12 DIAGNOSIS — R112 Nausea with vomiting, unspecified: Secondary | ICD-10-CM | POA: Diagnosis not present

## 2019-02-12 DIAGNOSIS — Z8249 Family history of ischemic heart disease and other diseases of the circulatory system: Secondary | ICD-10-CM | POA: Insufficient documentation

## 2019-02-12 DIAGNOSIS — M791 Myalgia, unspecified site: Secondary | ICD-10-CM | POA: Diagnosis not present

## 2019-02-12 DIAGNOSIS — C9001 Multiple myeloma in remission: Secondary | ICD-10-CM

## 2019-02-12 DIAGNOSIS — E669 Obesity, unspecified: Secondary | ICD-10-CM | POA: Insufficient documentation

## 2019-02-12 DIAGNOSIS — R911 Solitary pulmonary nodule: Secondary | ICD-10-CM | POA: Diagnosis not present

## 2019-02-12 MED ORDER — ALBUTEROL SULFATE HFA 108 (90 BASE) MCG/ACT IN AERS: 2.0000 | INHALATION_SPRAY | Freq: Four times a day (QID) | RESPIRATORY_TRACT | 3 refills | Status: DC | PRN

## 2019-02-12 NOTE — Progress Notes (Signed)
[place], [place] [address]   CLINIC:  Medical Oncology/Hematology  PCP:  Abran Richard, MD [address] [place] [address] [phone]   REASON FOR VISIT:  Follow-up for MM   CURRENT THERAPY: salvage therapy   BRIEF ONCOLOGIC HISTORY:  Oncology History  Multiple myeloma ([place])  08/07/2014 Imaging   Bone Survey- No lytic lesions are noted in the visualized skeleton.   09/03/2014 Bone Marrow Biopsy   NORMOCELLULAR BONE MARROW FOR AGE WITH PLASMA CELL NEOPLASM.  The plasma cell component is increased in the marrow representing an estimated 18% of all cells. Cytogenetics with 13q-, 17p- (high risk disease)   09/03/2014 Pathology Results   Cytogenetics with 13q-, 17p- (high risk disease)   09/23/2014 Initial Diagnosis   Multiple myeloma   09/30/2014 PET scan   No abnormal hypermetabolism in the neck, chest, abdomen or pelvis.   10/05/2014 - 03/22/2015 Chemotherapy   RVD   10/28/2014 Treatment Plan Change   Issues related to getting Revlimid in a timely fashion, therefore, she received her Revlimid on 9/21 resulting in a 12 day cycle this time instead of a 14 day cycle   11/02/2014 Imaging   CTA chest- No evidence for a large or central pulmonary embolism as described.  8 mm density along the right minor fissure could represent focal pleural thickening but indeterminate. If the patient is at high risk for bronchogenic carcinoma, follow-up c   11/23/2014 Miscellaneous   Zometa 4 mg IV monthly   04/07/2015 Bone Marrow Biopsy   Normocellular marrow with 2-4% clonal plasma cells by immunohistochemistry. FISH and cytogenetics were normal [facility])     04/2015 Miscellaneous   PRETRANSPLANT EVALUATION:  Pulmonary function tests: FEV1 100.3% / DLCO 97.9%  Echocardiogram: Normal LV function with EF 60-65%    04/27/2015 Procedure   Stem cell mobilization with filgrastim and Mozobil [facility])   05/06/2015 Miscellaneous   BMT conditioning  regimen with high-dose Melphalan given [facility] [name]); Day -1   05/07/2015 Bone Marrow Transplant   Outpatient autologous stem cell transplant [facility], [name]); Day 0   05/18/2015 Miscellaneous   WBC engraftment;  did not require platelet transfusion during her transplant process. Lowest platelet count 28,000    05/20/2015 Procedure   Tunneled catheter removed [facility])   09/08/2015 -  Chemotherapy   Velcade every 2 weeks   12/15/2015 Miscellaneous   Zometa re-instituted.    12/23/2015 Imaging   Bone density- BMD as determined from Forearm Radius 33% is 0.799 g/cm2 with a T-Score of 1.2. This patient is considered normal according to [facility]) criteria.   04/17/2016 Miscellaneous   Started maintenance with Ninlaro.   05/04/2016 Treatment Plan Change   Zometa switched to Xgeva injection monthly given difficult IV access.       CANCER STAGING: Cancer Staging Multiple myeloma [facility]) Staging form: Multiple Myeloma, AJCC 6th Edition - Clinical stage from 10/26/2014: Stage IIA - Signed by Baird Cancer, PA-C on 10/26/2014 - Pathologic: No stage assigned - Unsigned    INTERVAL HISTORY:  Ms. Hoeppner 64 y.o. female presents today for follow-up.  Reports overall doing well.  She reports moderate fatigue.  She also notes she is more short of breath than usual.  She was recently started on gabapentin for right hand neuropathy.  She states the numbness and tingling has improved significantly and she is now able to rest.  She followed up with her transplant team, Dr. Blinda Leatherwood.  It was noted that she has disease progression per myeloma labs.  She is currently undergoing restaging work-up with PET/CT and bone marrow biopsy.  She is here today to discuss results of PET/CT.     REVIEW OF SYSTEMS:  Review of Systems  Constitutional: Positive for fatigue.  HENT:  Negative.   Eyes: Negative.   Respiratory: Positive for shortness of breath.     Cardiovascular: Negative.   Gastrointestinal: Negative.   Endocrine: Negative.   Genitourinary: Negative.    Musculoskeletal: Positive for arthralgias and myalgias.  Skin: Negative.   Neurological: Negative.   Hematological: Negative.   Psychiatric/Behavioral: Negative.      PAST MEDICAL/SURGICAL HISTORY:  Past Medical History:  Diagnosis Date  . Anemia   . Arthritis   . Cancer ([place])   . Claustrophobia 10/05/2014  . Hypertension   . Leukopenia 08/03/2014  . Normocytic hypochromic anemia 08/03/2014  . Renal disorder    stage 3    Past Surgical History:  Procedure Laterality Date  . ABDOMINAL HYSTERECTOMY    . CARDIAC SURGERY    . OTHER SURGICAL HISTORY     heart surgery as infant to "repair hole in heart"     SOCIAL HISTORY:  Social History   Socioeconomic History  . Marital status: Legally Separated    Spouse name: Not on file  . Number of children: Not on file  . Years of education: Not on file  . Highest education level: Not on file  Occupational History  . Not on file  Tobacco Use  . Smoking status: Never Smoker  . Smokeless tobacco: Never Used  Substance and Sexual Activity  . Alcohol use: No  . Drug use: No  . Sexual activity: Not on file    Comment: divorced- 2 daughters  Other Topics Concern  . Not on file  Social History Narrative  . Not on file   Social Determinants of Health   Financial Resource Strain:   . Difficulty of Paying Living Expenses: Not on file  Food Insecurity:   . Worried About Charity fundraiser in the Last Year: Not on file  . Ran Out of Food in the Last Year: Not on file  Transportation Needs:   . Lack of Transportation (Medical): Not on file  . Lack of Transportation (Non-Medical): Not on file  Physical Activity:   . Days of Exercise per Week: Not on file  . Minutes of Exercise per Session: Not on file  Stress:   . Feeling of Stress : Not on file  Social Connections:   . Frequency of Communication with Friends and  Family: Not on file  . Frequency of Social Gatherings with Friends and Family: Not on file  . Attends Religious Services: Not on file  . Active Member of Clubs or Organizations: Not on file  . Attends Archivist Meetings: Not on file  . Marital Status: Not on file  Intimate Partner Violence:   . Fear of Current or Ex-Partner: Not on file  . Emotionally Abused: Not on file  . Physically Abused: Not on file  . Sexually Abused: Not on file    FAMILY HISTORY:  Family History  Problem Relation Age of Onset  . Cancer Mother   . Hypertension Mother   . Cancer Father   . Hypertension Father   . Cancer Maternal Grandmother   . Hypertension Sister   . Hypertension Brother     CURRENT MEDICATIONS:  Outpatient Encounter Medications as of 02/12/2019  Medication Sig  . acyclovir (ZOVIRAX) 800 MG tablet Take 1 tablet (800 mg total) by mouth 2 (two) times daily.  . Ascorbic Acid (VITAMIN C) 1000 MG tablet Take 1,000 mg by mouth daily.   . cholecalciferol (VITAMIN D) 1000 units tablet Take 1,000 Units by mouth daily.  Marland Kitchen gabapentin (NEURONTIN) 300 MG capsule Take 1 capsule (300 mg total) by mouth 3 (three) times daily.  . hydrochlorothiazide (HYDRODIURIL) 25 MG tablet TAKE 1 TABLET BY MOUTH ONCE DAILY.  . Multiple Vitamin (TAB-A-VITE) TABS TAKE 1 TABLET BY MOUTH ONCE DAILY.  Marland Kitchen Zoledronic Acid 4 MG SOLR Inject into the vein.  Marland Kitchen ibuprofen (ADVIL) 400 MG tablet Take 1 tablet (400 mg total) by mouth every 8 (eight) hours as needed. (Patient not taking: Reported on 02/12/2019)  . prochlorperazine (COMPAZINE) 10 MG tablet Take 1 tablet (10 mg total) by mouth every 6 (six) hours as needed for nausea or vomiting. (Patient not taking: Reported on 11/22/2018)  . [DISCONTINUED] diazepam (VALIUM) 5 MG tablet Take 1 tablet (5 mg total) by mouth every 6 (six) hours as needed for anxiety.  . [DISCONTINUED] NINLARO 3 MG capsule Take 1 capsule (73m) by mouth once a week on days 1, 8 & 15 every 28 day cycle.  Take on empty stomach 1 hr before or 2 hrs after food   Facility-Administered Encounter Medications as of 02/12/2019  Medication  . heparin lock flush 100 unit/mL  . sodium chloride 0.9 % injection 10 mL    ALLERGIES:  Allergies  Allergen Reactions  . Diclofenac Swelling    Per pt, facial swelling     PHYSICAL EXAM:  ECOG Performance status: 1  Vitals:   02/12/19 0955  BP: (!) 141/61  Pulse: 92  Resp: 20  Temp: 97.8 F (36.6 C)  SpO2: 95%   Filed Weights   02/12/19 0955  Weight: (!) 358 lb (162.4 kg)    Physical Exam Constitutional:      Appearance: Normal appearance. She is obese.  HENT:     Head: Normocephalic.     Right Ear: External ear normal.     Left Ear: External ear normal.     Nose: Nose normal.     Mouth/Throat:     Pharynx: Oropharynx is clear.  Eyes:     Conjunctiva/sclera: Conjunctivae normal.  Cardiovascular:     Rate and Rhythm: Normal rate and regular rhythm.     Pulses: Normal pulses.     Heart sounds: Normal heart sounds.  Pulmonary:     Effort: Pulmonary effort is normal.     Breath sounds: Normal breath sounds.  Abdominal:     General: Bowel sounds are normal.  Musculoskeletal:        General: Normal range of motion.     Cervical back: Normal range of motion.     Comments: Decreased range of motion  Skin:    General: Skin is warm.  Neurological:     General: No focal deficit present.     Mental Status: She is alert and oriented to person, place, and time.  Psychiatric:        Mood and Affect: Mood normal.        Behavior: Behavior normal.      LABORATORY DATA:  I have reviewed the labs as listed.  CBC    Component Value Date/Time   WBC 4.2 01/27/2019 1010   RBC 3.67 (L) 01/27/2019 1010   HGB 11.1 (L) 01/27/2019 1010   HCT 34.7 (L)  01/27/2019 1010   PLT 163 01/27/2019 1010   MCV 94.6 01/27/2019 1010   MCH 30.2 01/27/2019 1010   MCHC 32.0 01/27/2019 1010   RDW 14.3 01/27/2019 1010   LYMPHSABS 1.2 01/27/2019 1010    MONOABS 0.5 01/27/2019 1010   EOSABS 0.1 01/27/2019 1010   BASOSABS 0.0 01/27/2019 1010   CMP Latest Ref Rng & Units 01/27/2019 12/23/2018 11/13/2018  Glucose 70 - 99 mg/dL 102(H) 111(H) 116(H)  BUN 8 - 23 mg/dL 28(H) 25(H) 23  Creatinine 0.44 - 1.00 mg/dL 1.16(H) 1.14(H) 1.10(H)  Sodium 135 - 145 mmol/L 137 136 137  Potassium 3.5 - 5.1 mmol/L 4.1 3.9 4.2  Chloride 98 - 111 mmol/L 99 99 98  CO2 22 - 32 mmol/L 27 26 26   Calcium 8.9 - 10.3 mg/dL 9.9 10.0 9.9  Total Protein 6.5 - 8.1 g/dL 8.1 8.4(H) 8.4(H)  Total Bilirubin 0.3 - 1.2 mg/dL 0.5 0.6 0.4  Alkaline Phos 38 - 126 U/L 47 54 57  AST 15 - 41 U/L 26 21 27   ALT 0 - 44 U/L 18 13 16          ASSESSMENT & PLAN:   Multiple myeloma 1.  IgG kappa myeloma with high risk features (del 17 P, -13 q.), stage II: -Diagnosed in August 2016, induction treatment with RVD. 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 -Maintenance Ninlaro on days 1, 8, 15 every 28 days started in March 2018. As she is tolerating Ninlaro very well without any diarrhea.  She does report occasional vomiting and nausea.  She has Compazine to help control this now. -Labs on 11/22/2018 showed M spike has increased to 1.1 g/dL (0.7 g/dL as previously).  Free light chain ratio is stable at 2.73.  Kappa light chains have increased to 43.1. -His recent labs from January 27, 2019 revealed M spike of 1.0, kappa light chain elevated at 42.0, lambda light chain 15.6, and a ratio of 2.69. -At patient's last office visit, there was concern for possible disease progression with increasing M spike.  Patient was recommended to follow-up with Dr. Norma Fredrickson who also agreed labs are consistent with disease progression.  She is currently undergoing restaging work-up with PET/CT and bone marrow biopsy.  PET was completed 02/10/2019 and was negative for any osseous lesions or extraosseous hypermetabolism.  She will present for her bone marrow biopsy on February 14, 2019.  We briefly discussed  salvage therapy with carfilzomib, pomalidomide, and dexamethasone.  Patient does not have good veins, therefore we have recommended she proceed with port placement.  She will also need echocardiogram before starting carfilzomib. -She will return next week for results of bone marrow biopsy.  2.  ID prophylaxis: -She will continue acyclovir twice daily.  She will also continue 81 mg aspirin daily.  3.  Bone strengthening: -She completed zoledronic acid for more than 2 years.  It has been discontinued now.  4.  Neuropathy: -She has numbness in the right hand fingertips and the right foot on and off.   -She was started on gabapentin.  She states this is improved neuropathy.  5.  Right hand swelling: -Patient reports she has having right hand and wrist swelling and pain it is keeping her up at night. -We will order ultrasound of the right extremity to rule out DVT. -Ultrasound done on 11/22/2018 showed no evidence of DVT. -Swelling of her right hand has improved.  She does describe what sounds like neuropathy, which gabapentin is helping.  Would also recommend obtaining a  uric acid level to rule out gout.      Orders placed this encounter:  Orders Placed This Encounter  Procedures  . Uric acid  . [facility]  . ECHOCARDIOGRAM COMPLETE  . Chromosome analysis      [place] [phone]

## 2019-02-12 NOTE — Assessment & Plan Note (Signed)
1.  IgG kappa myeloma with high risk features (del 17 P, -13 q.), stage II: -Diagnosed in August 2016, induction treatment with RVD. 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 -Maintenance Ninlaro on days 1, 8, 15 every 28 days started in March 2018. As she is tolerating Ninlaro very well without any diarrhea.  She does report occasional vomiting and nausea.  She has Compazine to help control this now. -Labs on 11/22/2018 showed M spike has increased to 1.1 g/dL (0.7 g/dL as previously).  Free light chain ratio is stable at 2.73.  Kappa light chains have increased to 43.1. -His recent labs from January 27, 2019 revealed M spike of 1.0, kappa light chain elevated at 42.0, lambda light chain 15.6, and a ratio of 2.69. -At patient's last office visit, there was concern for possible disease progression with increasing M spike.  Patient was recommended to follow-up with Dr. Norma Fredrickson who also agreed labs are consistent with disease progression.  She is currently undergoing restaging work-up with PET/CT and bone marrow biopsy.  PET was completed 02/10/2019 and was negative for any osseous lesions or extraosseous hypermetabolism.  She will present for her bone marrow biopsy on February 14, 2019.  We briefly discussed salvage therapy with carfilzomib, pomalidomide, and dexamethasone.  Patient does not have good veins, therefore we have recommended she proceed with port placement.  She will also need echocardiogram before starting carfilzomib. -She will return next week for results of bone marrow biopsy.  2.  ID prophylaxis: -She will continue acyclovir twice daily.  She will also continue 81 mg aspirin daily.  3.  Bone strengthening: -She completed zoledronic acid for more than 2 years.  It has been discontinued now.  4.  Neuropathy: -She has numbness in the right hand fingertips and the right foot on and off.   -She was started on gabapentin.  She states this is improved neuropathy.  5.  Right hand  swelling: -Patient reports she has having right hand and wrist swelling and pain it is keeping her up at night. -We will order ultrasound of the right extremity to rule out DVT. -Ultrasound done on 11/22/2018 showed no evidence of DVT. -Swelling of her right hand has improved.  She does describe what sounds like neuropathy, which gabapentin is helping.  Would also recommend obtaining a uric acid level to rule out gout.

## 2019-02-13 ENCOUNTER — Other Ambulatory Visit (HOSPITAL_COMMUNITY): Payer: Self-pay | Admitting: Hematology

## 2019-02-13 ENCOUNTER — Other Ambulatory Visit: Payer: Self-pay

## 2019-02-13 ENCOUNTER — Ambulatory Visit (HOSPITAL_COMMUNITY)
Admission: RE | Admit: 2019-02-13 | Discharge: 2019-02-13 | Disposition: A | Discharge status: Home / Self Care | Payer: Medicare Other | Source: Ambulatory Visit | Attending: Hematology | Admitting: Hematology

## 2019-02-13 ENCOUNTER — Encounter: Payer: Self-pay | Admitting: General Surgery

## 2019-02-13 ENCOUNTER — Ambulatory Visit (INDEPENDENT_AMBULATORY_CARE_PROVIDER_SITE_OTHER): Payer: Medicare Other | Admitting: General Surgery

## 2019-02-13 ENCOUNTER — Other Ambulatory Visit: Payer: Self-pay | Admitting: Student

## 2019-02-13 VITALS — BP 145/83 | HR 92 | Temp 98.1°F | Resp 18 | Ht 62.0 in | Wt 326.0 lb

## 2019-02-13 DIAGNOSIS — C9000 Multiple myeloma not having achieved remission: Secondary | ICD-10-CM | POA: Diagnosis present

## 2019-02-13 DIAGNOSIS — C9002 Multiple myeloma in relapse: Secondary | ICD-10-CM | POA: Diagnosis not present

## 2019-02-13 DIAGNOSIS — R0602 Shortness of breath: Secondary | ICD-10-CM | POA: Diagnosis not present

## 2019-02-13 DIAGNOSIS — M1990 Unspecified osteoarthritis, unspecified site: Secondary | ICD-10-CM | POA: Insufficient documentation

## 2019-02-13 DIAGNOSIS — Z7982 Long term (current) use of aspirin: Secondary | ICD-10-CM | POA: Diagnosis not present

## 2019-02-13 DIAGNOSIS — Z9481 Bone marrow transplant status: Secondary | ICD-10-CM | POA: Diagnosis not present

## 2019-02-13 DIAGNOSIS — I10 Essential (primary) hypertension: Secondary | ICD-10-CM | POA: Insufficient documentation

## 2019-02-13 DIAGNOSIS — Z79899 Other long term (current) drug therapy: Secondary | ICD-10-CM | POA: Diagnosis not present

## 2019-02-13 NOTE — H&P (Signed)
[facility] History and Physical  Reason for Referral: Multiple myeloma port a catheter placement  Referring Physician:  Dr. Katragadda   Chief Complaint    Other      Kelli Hurley is a 64 y.o. female.  HPI: Kelli Hurley is a 64 yo with multiple myeloma diagnosed in 2016 that has been receiving treatment and continues to require treatment. She says that in 2016 she had a right sided PICC / temporary catheter, but that since that time her therapy has been through an IV. She is having progressively more difficult time getting IV access, and we have been asked to place a port a catheter for the patient. She denies having any catheter on the left side, and she is right handed. She has had right hand swelling and workup has been negative for RUE DVT.    Past Medical History:  Diagnosis Date  . Anemia   . Arthritis   . Cancer ([facility])   . Claustrophobia 10/05/2014  . Hypertension   . Leukopenia 08/03/2014  . Normocytic hypochromic anemia 08/03/2014  . Renal disorder    stage 3     Past Surgical History:  Procedure Laterality Date  . ABDOMINAL HYSTERECTOMY    . CARDIAC SURGERY    . OTHER SURGICAL HISTORY     heart surgery as infant to "repair hole in heart"    Family History  Problem Relation Age of Onset  . Cancer Mother   . Hypertension Mother   . Cancer Father   . Hypertension Father   . Cancer Maternal Grandmother   . Hypertension Sister   . Hypertension Brother     Social History   Tobacco Use  . Smoking status: Never Smoker  . Smokeless tobacco: Never Used  Substance Use Topics  . Alcohol use: No  . Drug use: No    Medications: I have reviewed the patient's current medications. Allergies as of 02/13/2019      Reactions   Diclofenac Swelling   Per pt, facial swelling      Medication List       Accurate as of February 13, 2019  2:00 PM. If you have any questions, ask your nurse or doctor.        acyclovir 800 MG tablet Commonly  known as: ZOVIRAX Take 1 tablet (800 mg total) by mouth 2 (two) times daily.   albuterol 108 (90 Base) MCG/ACT inhaler Commonly known as: VENTOLIN HFA Inhale 2 puffs into the lungs every 6 (six) hours as needed for wheezing or shortness of breath.   cholecalciferol 1000 units tablet Commonly known as: VITAMIN D Take 1,000 Units by mouth daily.   gabapentin 300 MG capsule Commonly known as: NEURONTIN Take 1 capsule (300 mg total) by mouth 3 (three) times daily.   hydrochlorothiazide 25 MG tablet Commonly known as: HYDRODIURIL TAKE 1 TABLET BY MOUTH ONCE DAILY.   ibuprofen 400 MG tablet Commonly known as: ADVIL Take 1 tablet (400 mg total) by mouth every 8 (eight) hours as needed.   prochlorperazine 10 MG tablet Commonly known as: COMPAZINE Take 1 tablet (10 mg total) by mouth every 6 (six) hours as needed for nausea or vomiting.   Tab-A-Vite Tabs TAKE 1 TABLET BY MOUTH ONCE DAILY.   vitamin C 1000 MG tablet Take 1,000 mg by mouth daily.   Zoledronic Acid 4 MG Solr Inject into the vein.        ROS:  A comprehensive review of systems was negative except for:   Respiratory: positive for SOB Musculoskeletal: positive for joint pain  Blood pressure (!) 145/83, pulse 92, temperature 98.1 F (36.7 C), temperature source Temporal, resp. rate 18, height 5' 2" (1.575 m), weight (!) 326 lb (147.9 kg), SpO2 94 %. Physical Exam Vitals reviewed.  Constitutional:      Appearance: Normal appearance.  HENT:     Head: Normocephalic and atraumatic.     Nose: Nose normal.     Mouth/Throat:     Mouth: Mucous membranes are moist.  Eyes:     Extraocular Movements: Extraocular movements intact.     Pupils: Pupils are equal, round, and reactive to light.  Cardiovascular:     Rate and Rhythm: Normal rate and regular rhythm.  Pulmonary:     Effort: Pulmonary effort is normal.     Breath sounds: Normal breath sounds.  Abdominal:     General: There is no distension.     Palpations:  Abdomen is soft.  Musculoskeletal:        General: Swelling present. Normal range of motion.     Cervical back: Normal range of motion.  Skin:    General: Skin is warm and dry.  Neurological:     General: No focal deficit present.     Mental Status: She is alert and oriented to person, place, and time.  Psychiatric:        Mood and Affect: Mood normal.        Behavior: Behavior normal.        Thought Content: Thought content normal.        Judgment: Judgment normal.     Results: none  Assessment & Plan:  Kelli Hurley is a 64 y.o. female with multiple myeloma getting treatment and having difficulty with access. She needs a port a catheter placed.  -Port a catheter placement, will plan for left side -Discussed risk of bleeding, infection, malfunction, pneumothorax, and she has opted to proceed. -She only takes aspirin 81 mg daily. Can continue.   All questions were answered to the satisfaction of the patient.   Myah Guynes C Larri Yehle 02/13/2019, 2:00 PM       

## 2019-02-13 NOTE — Progress Notes (Signed)
[facility] History and Physical  Reason for Referral: Multiple myeloma port a catheter placement  Referring Physician:  Dr. Delton Coombes   Chief Complaint    Other      Kelli Hurley is a 64 y.o. female.  HPI: Kelli Hurley is a 64 yo with multiple myeloma diagnosed in 2016 that has been receiving treatment and continues to require treatment. She says that in 2016 she had a right sided PICC / temporary catheter, but that since that time her therapy has been through an IV. She is having progressively more difficult time getting IV access, and we have been asked to place a port a catheter for the patient. She denies having any catheter on the left side, and she is right handed. She has had right hand swelling and workup has been negative for RUE DVT.    Past Medical History:  Diagnosis Date  . Anemia   . Arthritis   . Cancer ([place])   . Claustrophobia 10/05/2014  . Hypertension   . Leukopenia 08/03/2014  . Normocytic hypochromic anemia 08/03/2014  . Renal disorder    stage 3     Past Surgical History:  Procedure Laterality Date  . ABDOMINAL HYSTERECTOMY    . CARDIAC SURGERY    . OTHER SURGICAL HISTORY     heart surgery as infant to "repair hole in heart"    Family History  Problem Relation Age of Onset  . Cancer Mother   . Hypertension Mother   . Cancer Father   . Hypertension Father   . Cancer Maternal Grandmother   . Hypertension Sister   . Hypertension Brother     Social History   Tobacco Use  . Smoking status: Never Smoker  . Smokeless tobacco: Never Used  Substance Use Topics  . Alcohol use: No  . Drug use: No    Medications: I have reviewed the patient's current medications. Allergies as of 02/13/2019      Reactions   Diclofenac Swelling   Per pt, facial swelling      Medication List       Accurate as of February 13, 2019  2:00 PM. If you have any questions, ask your nurse or doctor.        acyclovir 800 MG tablet Commonly  known as: ZOVIRAX Take 1 tablet (800 mg total) by mouth 2 (two) times daily.   albuterol 108 (90 Base) MCG/ACT inhaler Commonly known as: VENTOLIN HFA Inhale 2 puffs into the lungs every 6 (six) hours as needed for wheezing or shortness of breath.   cholecalciferol 1000 units tablet Commonly known as: VITAMIN D Take 1,000 Units by mouth daily.   gabapentin 300 MG capsule Commonly known as: NEURONTIN Take 1 capsule (300 mg total) by mouth 3 (three) times daily.   hydrochlorothiazide 25 MG tablet Commonly known as: HYDRODIURIL TAKE 1 TABLET BY MOUTH ONCE DAILY.   ibuprofen 400 MG tablet Commonly known as: ADVIL Take 1 tablet (400 mg total) by mouth every 8 (eight) hours as needed.   prochlorperazine 10 MG tablet Commonly known as: COMPAZINE Take 1 tablet (10 mg total) by mouth every 6 (six) hours as needed for nausea or vomiting.   Tab-A-Vite Tabs TAKE 1 TABLET BY MOUTH ONCE DAILY.   vitamin C 1000 MG tablet Take 1,000 mg by mouth daily.   Zoledronic Acid 4 MG Solr Inject into the vein.        ROS:  A comprehensive review of systems was negative except for:  Respiratory: positive for SOB Musculoskeletal: positive for joint pain  Blood pressure (!) 145/83, pulse 92, temperature 98.1 F (36.7 C), temperature source Temporal, resp. rate 18, height _0  (1.575 m), weight (!) 326 lb (147.9 kg), SpO2 94 %. Physical Exam Vitals reviewed.  Constitutional:      Appearance: Normal appearance.  HENT:     Head: Normocephalic and atraumatic.     Nose: Nose normal.     Mouth/Throat:     Mouth: Mucous membranes are moist.  Eyes:     Extraocular Movements: Extraocular movements intact.     Pupils: Pupils are equal, round, and reactive to light.  Cardiovascular:     Rate and Rhythm: Normal rate and regular rhythm.  Pulmonary:     Effort: Pulmonary effort is normal.     Breath sounds: Normal breath sounds.  Abdominal:     General: There is no distension.     Palpations:  Abdomen is soft.  Musculoskeletal:        General: Swelling present. Normal range of motion.     Cervical back: Normal range of motion.  Skin:    General: Skin is warm and dry.  Neurological:     General: No focal deficit present.     Mental Status: She is alert and oriented to person, place, and time.  Psychiatric:        Mood and Affect: Mood normal.        Behavior: Behavior normal.        Thought Content: Thought content normal.        Judgment: Judgment normal.     Results: none  Assessment & Plan:  Kelli Hurley is a 64 y.o. female with multiple myeloma getting treatment and having difficulty with access. She needs a port a catheter placed.  -Port a catheter placement, will plan for left side -Discussed risk of bleeding, infection, malfunction, pneumothorax, and she has opted to proceed. -She only takes aspirin 81 mg daily. Can continue.   All questions were answered to the satisfaction of the patient.   Virl Cagey 02/13/2019, 2:00 PM

## 2019-02-13 NOTE — Patient Instructions (Signed)

## 2019-02-14 ENCOUNTER — Other Ambulatory Visit: Payer: Self-pay

## 2019-02-14 ENCOUNTER — Other Ambulatory Visit (HOSPITAL_COMMUNITY): Payer: Self-pay | Admitting: Hematology

## 2019-02-14 ENCOUNTER — Ambulatory Visit (HOSPITAL_COMMUNITY)
Admission: RE | Admit: 2019-02-14 | Discharge: 2019-02-14 | Disposition: A | Discharge status: Home / Self Care | Payer: Medicare Other | Source: Ambulatory Visit | Attending: Hematology | Admitting: Hematology

## 2019-02-14 ENCOUNTER — Encounter (HOSPITAL_COMMUNITY): Payer: Self-pay

## 2019-02-14 DIAGNOSIS — R0602 Shortness of breath: Secondary | ICD-10-CM

## 2019-02-14 DIAGNOSIS — C9000 Multiple myeloma not having achieved remission: Secondary | ICD-10-CM

## 2019-02-14 DIAGNOSIS — C9001 Multiple myeloma in remission: Secondary | ICD-10-CM

## 2019-02-14 LAB — PROTIME-INR
INR: 0.9 (ref 0.8–1.2)
Prothrombin Time: 12.5 seconds (ref 11.4–15.2)

## 2019-02-14 LAB — CBC WITH DIFFERENTIAL/PLATELET
Abs Immature Granulocytes: 0.02 10*3/uL (ref 0.00–0.07)
Basophils Absolute: 0 10*3/uL (ref 0.0–0.1)
Basophils Relative: 0 %
Eosinophils Absolute: 0 10*3/uL (ref 0.0–0.5)
Eosinophils Relative: 1 %
HCT: 35.5 % — ABNORMAL LOW (ref 36.0–46.0)
Hemoglobin: 11.2 g/dL — ABNORMAL LOW (ref 12.0–15.0)
Immature Granulocytes: 0 %
Lymphocytes Relative: 24 %
Lymphs Abs: 1.5 10*3/uL (ref 0.7–4.0)
MCH: 30.1 pg (ref 26.0–34.0)
MCHC: 31.5 g/dL (ref 30.0–36.0)
MCV: 95.4 fL (ref 80.0–100.0)
Monocytes Absolute: 0.5 10*3/uL (ref 0.1–1.0)
Monocytes Relative: 9 %
Neutro Abs: 4 10*3/uL (ref 1.7–7.7)
Neutrophils Relative %: 66 %
Platelets: 177 10*3/uL (ref 150–400)
RBC: 3.72 MIL/uL — ABNORMAL LOW (ref 3.87–5.11)
RDW: 14.4 % (ref 11.5–15.5)
WBC: 6.1 10*3/uL (ref 4.0–10.5)
nRBC: 0 % (ref 0.0–0.2)

## 2019-02-14 LAB — APTT: aPTT: 25 seconds (ref 24–36)

## 2019-02-14 MED ORDER — MIDAZOLAM HCL 2 MG/2ML IJ SOLN
INTRAMUSCULAR | Status: AC | PRN
  Administered 2019-02-14 (×2): 1 mg via INTRAVENOUS

## 2019-02-14 MED ORDER — SODIUM CHLORIDE 0.9 % IV SOLN: INTRAVENOUS | Status: DC

## 2019-02-14 MED ORDER — FENTANYL CITRATE (PF) 100 MCG/2ML IJ SOLN
INTRAMUSCULAR | Status: AC
  Filled 2019-02-14: qty 2

## 2019-02-14 MED ORDER — MIDAZOLAM HCL 2 MG/2ML IJ SOLN
INTRAMUSCULAR | Status: AC
  Filled 2019-02-14: qty 4

## 2019-02-14 MED ORDER — FENTANYL CITRATE (PF) 100 MCG/2ML IJ SOLN
INTRAMUSCULAR | Status: AC | PRN
  Administered 2019-02-14: 50 ug via INTRAVENOUS

## 2019-02-14 NOTE — H&P (Signed)
Referring Physician(s): Roger Shelter  Supervising Physician: Aletta Edouard  Patient Status:  [facility]  Chief Complaint:  "I'm having a bone marrow biopsy"  Subjective: Patient familiar to IR service from bone marrow biopsy in 2016.  She has a history of multiple myeloma initially diagnosed in 2016 who now presents with disease progression.  She is scheduled today for repeat bone marrow biopsy for further evaluation.  She currently denies fever, headache, chest pain, dyspnea, cough, abdominal/back pain, nausea, vomiting or bleeding.  Past Medical History:  Diagnosis Date  . Anemia   . Arthritis   . Cancer ([place])   . Claustrophobia 10/05/2014  . Hypertension   . Leukopenia 08/03/2014  . Normocytic hypochromic anemia 08/03/2014  . Renal disorder    stage 3    Past Surgical History:  Procedure Laterality Date  . ABDOMINAL HYSTERECTOMY    . CARDIAC SURGERY    . OTHER SURGICAL HISTORY     heart surgery as infant to "repair hole in heart"       Allergies: Diclofenac  Medications: Prior to Admission medications   Medication Sig Start Date End Date Taking? Authorizing Provider  acyclovir (ZOVIRAX) 800 MG tablet Take 1 tablet (800 mg total) by mouth 2 (two) times daily. 05/29/18  Yes Derek Jack, MD  Ascorbic Acid (VITAMIN C) 1000 MG tablet Take 1,000 mg by mouth daily.    Yes [provider]  aspirin EC 81 MG tablet Take 81 mg by mouth daily.   Yes [provider]  cholecalciferol (VITAMIN D) 1000 units tablet Take 1,000 Units by mouth daily.   Yes [provider]  gabapentin (NEURONTIN) 300 MG capsule Take 1 capsule (300 mg total) by mouth 3 (three) times daily. Patient taking differently: Take 300 mg by mouth 2 (two) times daily.  01/20/19  Yes Roger Shelter, FNP  hydrochlorothiazide (HYDRODIURIL) 25 MG tablet TAKE 1 TABLET BY MOUTH ONCE DAILY. Patient taking differently: Take 25 mg by mouth daily.  09/02/18  Yes Derek Jack, MD    ibuprofen (ADVIL) 200 MG tablet Take 800 mg by mouth every 8 (eight) hours as needed (pain.).   Yes [provider]  Multiple Vitamin (TAB-A-VITE) TABS TAKE 1 TABLET BY MOUTH ONCE DAILY. Patient taking differently: Take 1 tablet by mouth daily.  04/10/17  Yes Holley Bouche, NP  trolamine salicylate (ASPERCREME) 10 % cream Apply 1 application topically 2 (two) times daily.   Yes [provider]  albuterol (VENTOLIN HFA) 108 (90 Base) MCG/ACT inhaler Inhale 2 puffs into the lungs every 6 (six) hours as needed for wheezing or shortness of breath. 02/12/19   Roger Shelter, FNP  ibuprofen (ADVIL) 400 MG tablet Take 1 tablet (400 mg total) by mouth every 8 (eight) hours as needed. Patient not taking: Reported on 02/13/2019 09/30/18   Carole Civil, MD  prochlorperazine (COMPAZINE) 10 MG tablet Take 1 tablet (10 mg total) by mouth every 6 (six) hours as needed for nausea or vomiting. Patient not taking: Reported on 02/13/2019 10/21/18   Derek Jack, MD     Vital Signs: BP (!) 154/86 (BP Location: Right Arm)   Pulse 90   Temp 98.5 F (36.9 C) (Oral)   Resp 18   Ht 5' 2"  (1.575 m)   Wt (!) 360 lb 3.2 oz (163.4 kg)   SpO2 98%   BMI 65.88 kg/m   Physical Exam awake, alert.  Chest clear to auscultation bilaterally.  Heart with regular rate and  rhythm.  Abdomen obese, soft, positive bowel sounds, nontender. Bilat lower extremity edema noted.  Imaging: DG Chest 2 View  Result Date: 02/13/2019 CLINICAL DATA:  Shortness of breath. EXAM: CHEST - 2 VIEW COMPARISON:  January 20, 2015 FINDINGS: Mild, stable increased lung markings are seen without evidence of focal consolidation. There is no evidence of a pleural effusion or pneumothorax. The cardiac silhouette is mildly enlarged and unchanged in size. The visualized skeletal structures are unremarkable. IMPRESSION: 1. Stable exam without evidence of acute or active cardiopulmonary disease. Electronically Signed   By: Virgina Norfolk M.D.   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 EXAM: NUCLEAR MEDICINE PET SKULL BASE TO THIGH TECHNIQUE: 17.8 mCi F-18 FDG was injected intravenously. Full-ring PET imaging was performed from the skull base to thigh after the radiotracer. CT data was obtained and used for attenuation correction and anatomic localization. Fasting blood glucose: 104 mg/dl COMPARISON:  09/30/2014 PET-CT. FINDINGS: Mediastinal blood pool activity: SUV max 4.1 Liver activity: SUV max NA NECK: No hypermetabolic lymph nodes in the neck. Incidental CT findings: none CHEST: No enlarged or hypermetabolic axillary, mediastinal or hilar lymph nodes. No hypermetabolic pulmonary findings. Incidental CT findings: Mild cardiomegaly. No acute consolidative airspace disease, lung masses or significant pulmonary nodules. ABDOMEN/PELVIS: No abnormal hypermetabolic activity within the liver, pancreas, adrenal glands, or spleen. No hypermetabolic lymph nodes in the abdomen or pelvis. Incidental CT findings: Hysterectomy. SKELETON: No focal hypermetabolic activity to suggest skeletal metastasis. Incidental CT findings: none IMPRESSION: 1. No abnormal osseous or extraosseous hypermetabolism in the neck, chest, abdomen or pelvis. 2. Mild cardiomegaly. Electronically Signed   By: Ilona Sorrel M.D.   On: 02/11/2019 09:44    Labs:  CBC: Recent Labs    11/13/18 1305 12/23/18 1339 01/27/19 1010 02/14/19 0723  WBC 7.0 6.5 4.2 6.1  HGB 11.7* 11.4* 11.1* 11.2*  HCT 37.4 36.2 34.7* 35.5*  PLT 186 242 163 177    COAGS: Recent Labs    02/14/19 0723  INR 0.9  APTT 25    BMP: Recent Labs    10/16/18 1000 11/13/18 1305 12/23/18 1339 01/27/19 1010  NA 138 137 136 137  K 4.0 4.2 3.9 4.1  CL 101 98 99 99  CO2 27 26 26 27   GLUCOSE 97  116* 111* 102*  BUN 20 23 25* 28*  CALCIUM 9.3 9.9 10.0 9.9  CREATININE 0.91 1.10* 1.14* 1.16*  GFRNONAA >60 54* 51* 50*  GFRAA >60 >60 60* 58*    LIVER FUNCTION TESTS: Recent Labs    10/16/18 1000 11/13/18 1305 12/23/18 1339 01/27/19 1010  BILITOT 0.7 0.4 0.6 0.5  AST 52* 27 21 26   ALT 102* 16 13 18   ALKPHOS 81 57 54 47  PROT 8.0 8.4* 8.4* 8.1  ALBUMIN 4.1 4.4 4.3 3.9    Assessment and Plan: Pt with history of multiple myeloma initially diagnosed in 2016 who now presents with disease progression.  She is scheduled today for repeat bone marrow biopsy for further evaluation. Risks and benefits of procedure was discussed with the patient  including, but not limited to bleeding, infection, damage to adjacent structures or low yield requiring additional tests.  All of the questions were answered and there is agreement to proceed.  Consent signed and in chart.     Electronically Signed: D.  Rowe Robert, PA-C 02/14/2019, 8:28 AM   I spent a total of 20 minutes at the the patient's bedside AND on the patient's hospital floor or unit, greater than 50% of which was counseling/coordinating care for CT-guided bone marrow biopsy

## 2019-02-14 NOTE — Discharge Instructions (Signed)
For any questions or concerns call [phone]; for after hours cal [phone] and ask for on call MD   Bone Marrow Aspiration and Bone Marrow Biopsy, Adult, Care After This sheet gives you information about how to care for yourself after your procedure. Your health care provider may also give you more specific instructions. If you have problems or questions, contact your health care provider. What can I expect after the procedure? After the procedure, it is common to have:  Mild pain and tenderness.  Swelling.  Bruising. Follow these instructions at home: Puncture site care   Follow instructions from your health care provider about how to take care of the puncture site. Make sure you: ? Wash your hands with soap and water before and after you change your bandage (dressing). If soap and water are not available, use hand sanitizer. ? Change your dressing as told by your health care provider.  Check your puncture site every day for signs of infection. Check for: ? More redness, swelling, or pain. ? Fluid or blood. ? Warmth. ? Pus or a bad smell. Activity  Return to your normal activities as told by your health care provider. Ask your health care provider what activities are safe for you.  Do not lift anything that is heavier than 10 lb (4.5 kg), or the limit that you are told, until your health care provider says that it is safe.  Do not drive for 24 hours if you were given a sedative during your procedure. General instructions   Take over-the-counter and prescription medicines only as told by your health care provider.  Do not take baths, swim, or use a hot tub until your health care provider approves. Ask your health care provider if you may take showers. You may only be allowed to take sponge baths.  If directed, put ice on the affected area. To do this: ? Put ice in a plastic bag. ? Place a towel between your skin and the bag. ? Leave the ice on for 20 minutes, 2-3  times a day.  Keep all follow-up visits as told by your health care provider. This is important. Contact a health care provider if:  Your pain is not controlled with medicine.  You have a fever.  You have more redness, swelling, or pain around the puncture site.  You have fluid or blood coming from the puncture site.  Your puncture site feels warm to the touch.  You have pus or a bad smell coming from the puncture site. Summary  After the procedure, it is common to have mild pain, tenderness, swelling, and bruising.  Follow instructions from your health care provider about how to take care of the puncture site and what activities are safe for you.  Take over-the-counter and prescription medicines only as told by your health care provider.  Contact a health care provider if you have any signs of infection, such as fluid or blood coming from the puncture site. This information is not intended to replace advice given to you by your health care provider. Make sure you discuss any questions you have with your health care provider. Document Revised: 06/11/2018 Document Reviewed: 06/11/2018 Elsevier Patient Education  [place].  Moderate Conscious Sedation, Adult, Care After These instructions provide you with information about caring for yourself after your procedure. Your health care provider may also give you more specific instructions. Your treatment has been planned according to current medical practices, but problems sometimes occur. Call your health care  provider if you have any problems or questions after your procedure. What can I expect after the procedure? After your procedure, it is common:  To feel sleepy for several hours.  To feel clumsy and have poor balance for several hours.  To have poor judgment for several hours.  To vomit if you eat too soon. Follow these instructions at home: For at least 24 hours after the procedure:   Do not: ? Participate in  activities where you could fall or become injured. ? Drive. ? Use heavy machinery. ? Drink alcohol. ? Take sleeping pills or medicines that cause drowsiness. ? Make important decisions or sign legal documents. ? Take care of children on your own.  Rest. Eating and drinking  Follow the diet recommended by your health care provider.  If you vomit: ? Drink water, juice, or soup when you can drink without vomiting. ? Make sure you have little or no nausea before eating solid foods. General instructions  Have a responsible adult stay with you until you are awake and alert.  Take over-the-counter and prescription medicines only as told by your health care provider.  If you smoke, do not smoke without supervision.  Keep all follow-up visits as told by your health care provider. This is important. Contact a health care provider if:  You keep feeling nauseous or you keep vomiting.  You feel light-headed.  You develop a rash.  You have a fever. Get help right away if:  You have trouble breathing. This information is not intended to replace advice given to you by your health care provider. Make sure you discuss any questions you have with your health care provider. Document Revised: 01/05/2017 Document Reviewed: 05/15/2015 Elsevier Patient Education  2020 [employer].

## 2019-02-14 NOTE — Procedures (Signed)
Interventional Radiology Procedure Note  Procedure: CT guided bone marrow aspiration and biopsy  Complications: None  EBL: < 10 mL  Findings: Aspirate and core biopsy performed of bone marrow in right iliac bone.  Plan: Bedrest supine x 1 hrs  Fields Oros T. Mekhai Venuto, M.D Pager:  [phone]   

## 2019-02-17 ENCOUNTER — Other Ambulatory Visit: Payer: Self-pay

## 2019-02-17 NOTE — Patient Instructions (Signed)
Kelli Hurley  02/17/2019     @PREFPERIOPPHARMACY @   Your procedure is scheduled on 02/21/2019.  Report to [facility] at 8:30 A.M.  Call this number if you have problems the morning of surgery:  [phone]   Remember:  Do not eat or drink after midnight.    Implanted Port Insertion Implanted port insertion is a procedure to put in a port and catheter. The port is a device with an injectable disk that can be accessed by your health care provider. The port is connected to a vein in the chest or neck by a small flexible tube (catheter). There are different types of ports. The implanted port may be used as a long-term IV access for:  Medicines, such as chemotherapy.  Fluids.  Liquid nutrition, such as total parenteral nutrition (TPN). When you have a port, this means that your health care provider will not need to use the veins in your arms for these procedures. Tell a health care provider about:  Any allergies you have.  All medicines you are taking, especially blood thinners, as well as any vitamins, herbs, eye drops, creams, over-the-counter medicines, and steroids.  Any problems you or family members have had with anesthetic medicines.  Any blood disorders you have.  Any surgeries you have had.  Any medical conditions you have or have had, including diabetes or kidney problems.  Whether you are pregnant or may be pregnant. What are the risks? Generally, this is a safe procedure. However, problems may occur, including:  Allergic reactions to medicines or dyes.  Damage to other structures or organs.  Infection.  Damage to the blood vessel, bruising, or bleeding at the puncture site.  Blood clot.  Breakdown of the skin over the port.  A collection of air in the chest that can cause one of the lungs to collapse (pneumothorax). This is rare. What happens before the procedure? Medicines  Ask your health care provider about: ? Changing or stopping your  regular medicines. This is especially important if you are taking diabetes medicines or blood thinners. ? Taking medicines such as aspirin and ibuprofen. These medicines can thin your blood. Do not take these medicines unless your health care provider tells you to take them. ? Taking over-the-counter medicines, vitamins, herbs, and supplements. Staying hydrated Follow instructions from your health care provider about hydration, which may include:  Up to 2 hours before the procedure - you may continue to drink clear liquids, such as water, clear fruit juice, black coffee, and plain tea.  Eating and drinking restrictions  Follow instructions from your health care provider about eating and drinking, which may include: ? 8 hours before the procedure - stop eating heavy meals or foods, such as meat, fried foods, or fatty foods. ? 6 hours before the procedure - stop eating light meals or foods, such as toast or cereal. ? 6 hours before the procedure - stop drinking milk or drinks that contain milk. ? 2 hours before the procedure - stop drinking clear liquids. General instructions  Plan to have someone take you home from the hospital or clinic.  If you will be going home right after the procedure, plan to have someone with you for 24 hours.  You may have blood tests.  Do not use any products that contain nicotine or tobacco for at least 4-6 weeks before the procedure. These products include cigarettes, e-cigarettes, and chewing tobacco. If you need help quitting, ask your health care provider.  Ask  your health care provider what steps will be taken to help prevent infection. These may include: ? Removing hair at the surgery site. ? Washing skin with a germ-killing soap. ? Taking antibiotic medicine. What happens during the procedure?   An IV will be inserted into one of your veins.  You will be given one or more of the following: ? A medicine to help you relax (sedative). ? A medicine to  numb the area (local anesthetic).  Two small incisions will be made to insert the port. ? One smaller incision will be made in your neck to get access to the vein where the catheter will lie. ? The other incision will be made in the upper chest. This is where the port will lie.  The procedure may be done using continuous X-ray (fluoroscopy) or other imaging tools for guidance.  The port and catheter will be placed. There may be a small, raised area where the port is.  The port will be flushed with a salt solution (saline), and blood will be drawn to make sure that it is working correctly.  The incisions will be closed.  Bandages (dressings) may be placed over the incisions. The procedure may vary among health care providers and hospitals. What happens after the procedure?  Your blood pressure, heart rate, breathing rate, and blood oxygen level will be monitored until you leave the hospital or clinic.  Do not drive for 24 hours if you were given a sedative during your procedure.  You will be given a manufacturer's information card for the type of port that you have. Keep this with you.  Your port will need to be flushed and checked as told by your health care provider, usually every few weeks.  A chest X-ray will be done to: ? Check the placement of the port. ? Make sure there is no injury to your lung. Summary  Implanted port insertion is a procedure to put in a port and catheter.  The implanted port is used as a long-term IV access.  The port will need to be flushed and checked as told by your health care provider, usually every few weeks.  Keep your manufacturer's information card with you at all times. This information is not intended to replace advice given to you by your health care provider. Make sure you discuss any questions you have with your health care provider. Document Revised: 05/17/2018 Document Reviewed: 08/21/2017 Elsevier Patient Education  [place].     Take these medicines the morning of surgery with A SIP OF WATER : Gabapentin    Do not wear jewelry, make-up or nail polish.  Do not wear lotions, powders, or perfumes, or deodorant.  Do not shave 48 hours prior to surgery.  Men may shave face and neck.  Do not bring valuables to the hospital.  [facility] is not responsible for any belongings or valuables.  Contacts, dentures or bridgework may not be worn into surgery.  Leave your suitcase in the car.  After surgery it may be brought to your room.  For patients admitted to the hospital, discharge time will be determined by your treatment team.  Patients discharged the day of surgery will not be allowed to drive home.   Name and phone number of your driver:   family Special instructions:  N/A  Please read over the following fact sheets that you were given. Care and Recovery After Surgery

## 2019-02-18 ENCOUNTER — Encounter (HOSPITAL_COMMUNITY)
Admission: RE | Admit: 2019-02-18 | Discharge: 2019-02-18 | Disposition: A | Discharge status: Home / Self Care | Payer: Medicare Other | Source: Ambulatory Visit | Attending: General Surgery | Admitting: General Surgery

## 2019-02-18 ENCOUNTER — Other Ambulatory Visit: Payer: Self-pay

## 2019-02-19 ENCOUNTER — Other Ambulatory Visit (HOSPITAL_COMMUNITY)
Admission: RE | Admit: 2019-02-19 | Discharge: 2019-02-19 | Disposition: A | Discharge status: Home / Self Care | Payer: Medicare Other | Source: Ambulatory Visit | Attending: General Surgery | Admitting: General Surgery

## 2019-02-19 ENCOUNTER — Other Ambulatory Visit: Payer: Self-pay

## 2019-02-19 ENCOUNTER — Ambulatory Visit (HOSPITAL_COMMUNITY)
Admission: RE | Admit: 2019-02-19 | Discharge: 2019-02-19 | Disposition: A | Discharge status: Home / Self Care | Payer: Medicare Other | Source: Ambulatory Visit | Attending: Hematology | Admitting: Hematology

## 2019-02-19 DIAGNOSIS — C9001 Multiple myeloma in remission: Secondary | ICD-10-CM | POA: Diagnosis present

## 2019-02-19 DIAGNOSIS — Z20822 Contact with and (suspected) exposure to covid-19: Secondary | ICD-10-CM | POA: Insufficient documentation

## 2019-02-19 DIAGNOSIS — I10 Essential (primary) hypertension: Secondary | ICD-10-CM | POA: Insufficient documentation

## 2019-02-19 DIAGNOSIS — Q256 Stenosis of pulmonary artery: Secondary | ICD-10-CM | POA: Insufficient documentation

## 2019-02-19 DIAGNOSIS — I370 Nonrheumatic pulmonary valve stenosis: Secondary | ICD-10-CM | POA: Diagnosis not present

## 2019-02-19 LAB — SARS CORONAVIRUS 2 (TAT 6-24 HRS): SARS Coronavirus 2: NEGATIVE

## 2019-02-19 NOTE — Progress Notes (Signed)
*  PRELIMINARY RESULTS* Echocardiogram 2D Echocardiogram has been performed.  Kelli Hurley 02/19/2019, 12:46 PM

## 2019-02-21 ENCOUNTER — Ambulatory Visit (HOSPITAL_COMMUNITY): Payer: Medicare Other

## 2019-02-21 ENCOUNTER — Ambulatory Visit (HOSPITAL_COMMUNITY)
Admission: RE | Admit: 2019-02-21 | Discharge: 2019-02-21 | Disposition: A | Discharge status: Home / Self Care | Payer: Medicare Other | Attending: General Surgery | Admitting: General Surgery

## 2019-02-21 ENCOUNTER — Other Ambulatory Visit (HOSPITAL_COMMUNITY): Payer: Self-pay | Admitting: Nurse Practitioner

## 2019-02-21 ENCOUNTER — Encounter (HOSPITAL_COMMUNITY)
Admission: RE | Disposition: A | Discharge status: Home / Self Care | Payer: Self-pay | Source: Home / Self Care | Attending: General Surgery

## 2019-02-21 ENCOUNTER — Ambulatory Visit (HOSPITAL_COMMUNITY): Payer: Medicare Other | Admitting: Anesthesiology

## 2019-02-21 ENCOUNTER — Encounter (HOSPITAL_COMMUNITY): Payer: Self-pay | Admitting: General Surgery

## 2019-02-21 DIAGNOSIS — Z7982 Long term (current) use of aspirin: Secondary | ICD-10-CM | POA: Diagnosis not present

## 2019-02-21 DIAGNOSIS — M1990 Unspecified osteoarthritis, unspecified site: Secondary | ICD-10-CM | POA: Diagnosis not present

## 2019-02-21 DIAGNOSIS — Z95828 Presence of other vascular implants and grafts: Secondary | ICD-10-CM

## 2019-02-21 DIAGNOSIS — Z79899 Other long term (current) drug therapy: Secondary | ICD-10-CM | POA: Insufficient documentation

## 2019-02-21 DIAGNOSIS — N183 Chronic kidney disease, stage 3 unspecified: Secondary | ICD-10-CM | POA: Diagnosis not present

## 2019-02-21 DIAGNOSIS — C9000 Multiple myeloma not having achieved remission: Secondary | ICD-10-CM | POA: Diagnosis present

## 2019-02-21 DIAGNOSIS — I129 Hypertensive chronic kidney disease with stage 1 through stage 4 chronic kidney disease, or unspecified chronic kidney disease: Secondary | ICD-10-CM | POA: Diagnosis not present

## 2019-02-21 HISTORY — PX: PORTACATH PLACEMENT: SHX2246

## 2019-02-21 SURGERY — INSERTION, TUNNELED CENTRAL VENOUS DEVICE, WITH PORT
Anesthesia: General | Site: Chest | Laterality: Left

## 2019-02-21 MED ORDER — FENTANYL CITRATE (PF) 100 MCG/2ML IJ SOLN
INTRAMUSCULAR | Status: DC | PRN
  Administered 2019-02-21: 100 ug via INTRAVENOUS

## 2019-02-21 MED ORDER — HEPARIN SOD (PORK) LOCK FLUSH 100 UNIT/ML IV SOLN
INTRAVENOUS | Status: DC | PRN
  Administered 2019-02-21: 500 [IU] via INTRAVENOUS

## 2019-02-21 MED ORDER — HYDROCODONE-ACETAMINOPHEN 5-325 MG PO TABS: 1.0000 | ORAL_TABLET | ORAL | 0 refills | Status: DC | PRN

## 2019-02-21 MED ORDER — LIDOCAINE HCL (PF) 1 % IJ SOLN
INTRAMUSCULAR | Status: DC | PRN
  Administered 2019-02-21: 9 mL

## 2019-02-21 MED ORDER — PROPOFOL 10 MG/ML IV BOLUS
INTRAVENOUS | Status: DC | PRN
  Administered 2019-02-21: 200 mg via INTRAVENOUS

## 2019-02-21 MED ORDER — MIDAZOLAM HCL 5 MG/5ML IJ SOLN
INTRAMUSCULAR | Status: DC | PRN
  Administered 2019-02-21: 2 mg via INTRAVENOUS

## 2019-02-21 MED ORDER — LACTATED RINGERS IV SOLN
INTRAVENOUS | Status: DC
  Administered 2019-02-21: 1000 mL via INTRAVENOUS

## 2019-02-21 MED ORDER — CEFAZOLIN SODIUM-DEXTROSE 2-4 GM/100ML-% IV SOLN: 2.0000 g | INTRAVENOUS | Status: DC

## 2019-02-21 MED ORDER — CHLORHEXIDINE GLUCONATE CLOTH 2 % EX PADS: 6.0000 | MEDICATED_PAD | Freq: Once | CUTANEOUS | Status: DC

## 2019-02-21 MED ORDER — PROMETHAZINE HCL 25 MG/ML IJ SOLN: 6.2500 mg | INTRAMUSCULAR | Status: DC | PRN

## 2019-02-21 MED ORDER — DEXTROSE 5 % IV SOLN
3.0000 g | INTRAVENOUS | Status: AC
  Administered 2019-02-21: 3 g via INTRAVENOUS
  Filled 2019-02-21: qty 3000

## 2019-02-21 MED ORDER — HYDROMORPHONE HCL 1 MG/ML IJ SOLN
0.2500 mg | INTRAMUSCULAR | Status: DC | PRN
  Administered 2019-02-21: 0.5 mg via INTRAVENOUS
  Filled 2019-02-21: qty 0.5

## 2019-02-21 MED ORDER — LIDOCAINE HCL (PF) 1 % IJ SOLN
INTRAMUSCULAR | Status: AC
  Filled 2019-02-21: qty 30

## 2019-02-21 MED ORDER — SODIUM CHLORIDE (PF) 0.9 % IJ SOLN
INTRAMUSCULAR | Status: DC | PRN
  Administered 2019-02-21: 10 mL via INTRAVENOUS

## 2019-02-21 MED ORDER — FENTANYL CITRATE (PF) 100 MCG/2ML IJ SOLN
INTRAMUSCULAR | Status: AC
  Filled 2019-02-21: qty 2

## 2019-02-21 MED ORDER — CEFAZOLIN SODIUM-DEXTROSE 2-4 GM/100ML-% IV SOLN
INTRAVENOUS | Status: AC
  Filled 2019-02-21: qty 100

## 2019-02-21 MED ORDER — HYDROCODONE-ACETAMINOPHEN 7.5-325 MG PO TABS
1.0000 | ORAL_TABLET | Freq: Once | ORAL | Status: AC | PRN
  Administered 2019-02-21: 1 via ORAL
  Filled 2019-02-21: qty 1

## 2019-02-21 MED ORDER — HEPARIN SOD (PORK) LOCK FLUSH 100 UNIT/ML IV SOLN
INTRAVENOUS | Status: AC
  Filled 2019-02-21: qty 5

## 2019-02-21 MED ORDER — SUCCINYLCHOLINE CHLORIDE 200 MG/10ML IV SOSY
PREFILLED_SYRINGE | INTRAVENOUS | Status: DC | PRN
  Administered 2019-02-21: 160 mg via INTRAVENOUS

## 2019-02-21 MED ORDER — PROPOFOL 10 MG/ML IV BOLUS
INTRAVENOUS | Status: AC
  Filled 2019-02-21: qty 20

## 2019-02-21 MED ORDER — CEFAZOLIN SODIUM-DEXTROSE 1-4 GM/50ML-% IV SOLN
INTRAVENOUS | Status: AC
  Filled 2019-02-21: qty 50

## 2019-02-21 MED ORDER — ONDANSETRON HCL 4 MG/2ML IJ SOLN
INTRAMUSCULAR | Status: DC | PRN
  Administered 2019-02-21: 4 mg via INTRAVENOUS

## 2019-02-21 MED ORDER — GLYCOPYRROLATE 0.2 MG/ML IJ SOLN
INTRAMUSCULAR | Status: DC | PRN
  Administered 2019-02-21: .2 mg via INTRAVENOUS

## 2019-02-21 MED ORDER — DEXAMETHASONE SODIUM PHOSPHATE 10 MG/ML IJ SOLN
INTRAMUSCULAR | Status: DC | PRN
  Administered 2019-02-21: 6 mg via INTRAVENOUS

## 2019-02-21 MED ORDER — MIDAZOLAM HCL 2 MG/2ML IJ SOLN
INTRAMUSCULAR | Status: AC
  Filled 2019-02-21: qty 2

## 2019-02-21 MED ORDER — MIDAZOLAM HCL 2 MG/2ML IJ SOLN: 0.5000 mg | Freq: Once | INTRAMUSCULAR | Status: DC | PRN

## 2019-02-21 MED ORDER — LIDOCAINE HCL (CARDIAC) PF 100 MG/5ML IV SOSY
PREFILLED_SYRINGE | INTRAVENOUS | Status: DC | PRN
  Administered 2019-02-21: 60 mg via INTRAVENOUS

## 2019-02-21 SURGICAL SUPPLY — 37 items
APPLICATOR CHLORAPREP 10.5 ORG (MISCELLANEOUS) ×3 IMPLANT
APPLIER CLIP 9.375 SM OPEN (CLIP)
BAG DECANTER FOR FLEXI CONT (MISCELLANEOUS) ×3 IMPLANT
CLIP APPLIE 9.375 SM OPEN (CLIP) IMPLANT
CLOTH BEACON ORANGE TIMEOUT ST (SAFETY) ×3 IMPLANT
COVER LIGHT HANDLE STERIS (MISCELLANEOUS) ×6 IMPLANT
COVER WAND RF STERILE (DRAPES) ×3 IMPLANT
DECANTER SPIKE VIAL GLASS SM (MISCELLANEOUS) ×3 IMPLANT
DERMABOND ADVANCED (GAUZE/BANDAGES/DRESSINGS) ×2
DERMABOND ADVANCED .7 DNX12 (GAUZE/BANDAGES/DRESSINGS) ×1 IMPLANT
DRAPE C-ARM FOLDED MOBILE STRL (DRAPES) ×3 IMPLANT
ELECT REM PT RETURN 9FT ADLT (ELECTROSURGICAL) ×3
ELECTRODE REM PT RTRN 9FT ADLT (ELECTROSURGICAL) ×1 IMPLANT
GLOVE BIO SURGEON STRL SZ 6.5 (GLOVE) ×2 IMPLANT
GLOVE BIO SURGEONS STRL SZ 6.5 (GLOVE) ×1
GLOVE BIOGEL PI IND STRL 6.5 (GLOVE) ×1 IMPLANT
GLOVE BIOGEL PI IND STRL 7.0 (GLOVE) ×1 IMPLANT
GLOVE BIOGEL PI INDICATOR 6.5 (GLOVE) ×2
GLOVE BIOGEL PI INDICATOR 7.0 (GLOVE) ×2
GOWN STRL REUS W/TWL LRG LVL3 (GOWN DISPOSABLE) ×6 IMPLANT
IV NS 500ML (IV SOLUTION) ×2
IV NS 500ML BAXH (IV SOLUTION) ×1 IMPLANT
KIT PORT POWER 8FR ISP MRI (Port) ×3 IMPLANT
KIT TURNOVER KIT A (KITS) ×3 IMPLANT
MANIFOLD NEPTUNE II (INSTRUMENTS) ×3 IMPLANT
NDL HYPO 25X1 1.5 SAFETY (NEEDLE) ×1 IMPLANT
NEEDLE HYPO 25X1 1.5 SAFETY (NEEDLE) ×3 IMPLANT
PACK MINOR (CUSTOM PROCEDURE TRAY) ×3 IMPLANT
PAD ARMBOARD 7.5X6 YLW CONV (MISCELLANEOUS) ×3 IMPLANT
SET BASIN LINEN APH (SET/KITS/TRAYS/PACK) ×3 IMPLANT
SUT MNCRL AB 4-0 PS2 18 (SUTURE) ×3 IMPLANT
SUT PROLENE 2 0 SH 30 (SUTURE) ×3 IMPLANT
SUT VIC AB 3-0 SH 27 (SUTURE) ×2
SUT VIC AB 3-0 SH 27X BRD (SUTURE) ×1 IMPLANT
SYR 10ML LL (SYRINGE) ×6 IMPLANT
SYR 5ML LL (SYRINGE) ×3 IMPLANT
SYR CONTROL 10ML LL (SYRINGE) ×3 IMPLANT

## 2019-02-21 NOTE — Anesthesia Preprocedure Evaluation (Addendum)
Anesthesia Evaluation  Patient identified by MRN, date of birth, ID band Patient awake    Reviewed: Allergy & Precautions, NPO status , Patient's Chart, lab work & pertinent test results  Airway Mallampati: II  TM Distance: >3 FB Neck ROM: Full    Dental no notable dental hx. (+) Teeth Intact Front L upper tooth is chipped :   Pulmonary neg pulmonary ROS,  Uses ventolin q day  States breathing at baseline  Reports DOE States can lie flat    Pulmonary exam normal breath sounds clear to auscultation       Cardiovascular Exercise Tolerance: Poor hypertension, Pt. on medications negative cardio ROS Normal cardiovascular examII Rhythm:Regular Rate:Normal  Denies CP/MI  States had corrective cardiac surg at 46 months of age  Reports limited ET due to arthritis /Wt   Neuro/Psych Anxiety negative neurological ROS  negative psych ROS   GI/Hepatic negative GI ROS, Neg liver ROS,   Endo/Other  Morbid obesitySMO  Renal/GU Renal InsufficiencyRenal disease  negative genitourinary   Musculoskeletal  (+) Arthritis , Osteoarthritis,    Abdominal   Peds negative pediatric ROS (+)  Hematology negative hematology ROS (+) anemia ,   Anesthesia Other Findings MM- with recurrence here for port for Chemo  Reproductive/Obstetrics negative OB ROS                             Anesthesia Physical Anesthesia Plan  ASA: IV  Anesthesia Plan: General   Post-op Pain Management:    Induction: Intravenous  PONV Risk Score and Plan: 3 and Treatment may vary due to age or medical condition, Ondansetron, Dexamethasone and Midazolam  Airway Management Planned: Oral ETT  Additional Equipment:   Intra-op Plan:   Post-operative Plan: Extubation in OR  Informed Consent: I have reviewed the patients History and Physical, chart, labs and discussed the procedure including the risks, benefits and alternatives for  the proposed anesthesia with the patient or authorized representative who has indicated his/her understanding and acceptance.     Dental advisory given  Plan Discussed with: CRNA  Anesthesia Plan Comments: (Plan Full PPE use Plan GETA D/W PT -WTP with same after Q&A  BMI >60)       Anesthesia Quick Evaluation

## 2019-02-21 NOTE — Op Note (Signed)
Operative Note 02/21/19   Preoperative Diagnosis:  Multiple myleoma    Postoperative Diagnosis: Same   Procedure(s) Performed: Port-A-Cath placement, left subclavian   Surgeon: Lanell Matar. Constance Haw, MD   Assistants: No qualified resident was available   Anesthesia: General anesthesia    Anesthesiologist: Lenice Llamas, MD    Specimens: None   Estimated Blood Loss: Minimal   Fluoroscopy time: 25 seconds   Blood Replacement: None    Complications: None    Operative Findings: Normal anatomy   Indications:  Ms. Dannenberg is a 64 yo with multiple myeloma who needs access for continued treatment. She has had a prior PICC in the right arm and is having issues with access. She also has chronic swelling of the right arm and hand. We discussed the risk of port placement on the left including but not limited to bleeding, infection, malfunction, injury to vessel, and pneumothorax, and she opted to proceed.   Procedure: The patient was brought into the operating room and general anesthesia was induced.  One percent lidocaine was used for local anesthesia.   The left chest and neck was prepped and draped in the usual sterile fashion.  Preoperative antibiotics were given.  An incision was made below the left clavicle. A subcutaneous pocket was formed. The needles advanced into the left subclavian vein using the Seldinger technique without difficulty. A guidewire was then advanced into the right atrium under fluoroscopic guidance.  Ectopia was not noted. An introducer and peel-away sheath were placed over the guidewire. The catheter was then inserted through the peel-away sheath and the peel-away sheath was removed.  A spot film was performed to confirm the position. The catheter was then attached to the port and the port placed in subcutaneous pocket. Adequate positioning was confirmed by fluoroscopy. Hemostasis was confirmed, and the port was secured with 2-0 prolene sutures.  Good backflow of  blood was noted on aspiration of the port. The port was flushed with heparin flush. Subcutaneous layer was reapproximated using a 3-0 Vicryl interrupted suture. The skin was closed using a 4-0 Vicryl subcuticular suture. Dermabond was applied.  All tape and needle counts were correct at the end of the procedure. The patient was transferred to PACU in stable condition. A chest x-ray will be performed at that time.  Curlene Labrum, MD [facility] [address] [place], [place] [address] [phone] (office)

## 2019-02-21 NOTE — Anesthesia Procedure Notes (Signed)
Procedure Name: Intubation Date/Time: 02/21/2019 10:31 AM Performed by: Jonna Munro, CRNA Pre-anesthesia Checklist: Patient identified, Emergency Drugs available, Suction available, Patient being monitored and Timeout performed Patient Re-evaluated:Patient Re-evaluated prior to induction Oxygen Delivery Method: Circle system utilized Preoxygenation: Pre-oxygenation with 100% oxygen Induction Type: IV induction Laryngoscope Size: Mac and 3 Grade View: Grade I Tube type: Oral Tube size: 7.0 mm Number of attempts: 1 Airway Equipment and Method: Stylet Placement Confirmation: positive ETCO2,  ETT inserted through vocal cords under direct vision and breath sounds checked- equal and bilateral Secured at: 23 cm Tube secured with: Tape Dental Injury: Teeth and Oropharynx as per pre-operative assessment

## 2019-02-21 NOTE — Anesthesia Postprocedure Evaluation (Signed)
Anesthesia Post Note  Patient: Kelli Hurley  Procedure(s) Performed: INSERTION PORT-A-CATH (Left Chest)  Patient location during evaluation: PACU Anesthesia Type: General Level of consciousness: awake, awake and alert and oriented Pain management: pain level controlled Vital Signs Assessment: post-procedure vital signs reviewed and stable Respiratory status: nonlabored ventilation, patient connected to face mask oxygen, spontaneous breathing and respiratory function stable Cardiovascular status: stable Postop Assessment: no apparent nausea or vomiting Anesthetic complications: no     Last Vitals:  Vitals:   02/21/19 0855  BP: (!) 164/95  Pulse: 84  Resp: (!) 22  Temp: 37.1 C  SpO2: 96%    Last Pain:  Vitals:   02/21/19 0855  TempSrc: Oral  PainSc: 3                  Ronnie Doo

## 2019-02-21 NOTE — Progress Notes (Signed)
[facility]  Port in the superior vein cava. No ptx.  Curlene Labrum, MD [facility] [address] [place], [place] [address] [id number] [phone] (office)

## 2019-02-21 NOTE — Progress Notes (Signed)
[facility]  CXR ordered. Updated Rhonda about procedure. Prn following up in my office.  Curlene Labrum, MD [facility] [address] [place],  [address] [id number] [phone] (office)

## 2019-02-21 NOTE — Discharge Instructions (Signed)
Keep area clean and dry. You can take a shower in 24 hours. Do not submerge in water.  Take tylenol and ibuprofen for pain control and Norco for severe pain.   Implanted Port Insertion, Care After This sheet gives you information about how to care for yourself after your procedure. Your health care provider may also give you more specific instructions. If you have problems or questions, contact your health care provider. What can I expect after the procedure? After the procedure, it is common to have:  Discomfort at the port insertion site.  Bruising on the skin over the port. This should improve over 3-4 days. Follow these instructions at home: [facility] care  After your port is placed, you will get a manufacturer's information card. The card has information about your port. Keep this card with you at all times.  Take care of the port as told by your health care provider. Ask your health care provider if you or a family member can get training for taking care of the port at home. A home health care nurse may also take care of the port.  Make sure to remember what type of port you have. Incision care      Follow instructions from your health care provider about how to take care of your port insertion site. Make sure you: ? Wash your hands with soap and water before and after you change your bandage (dressing). If soap and water are not available, use hand sanitizer. ? Change your dressing as told by your health care provider. ? Leave stitches (sutures), skin glue, or adhesive strips in place. These skin closures may need to stay in place for 2 weeks or longer. If adhesive strip edges start to loosen and curl up, you may trim the loose edges. Do not remove adhesive strips completely unless your health care provider tells you to do that.  Check your port insertion site every day for signs of infection. Check for: ? Redness, swelling, or pain. ? Fluid or blood. ? Warmth. ? Pus or a bad  smell. Activity  Return to your normal activities as told by your health care provider. Ask your health care provider what activities are safe for you.  Do not lift anything that is heavier than 10 lb (4.5 kg), or the limit that you are told, until your health care provider says that it is safe. General instructions  Take over-the-counter and prescription medicines only as told by your health care provider.  You may shower.   Do not drive for 24 hours if you were given a sedative during your procedure.  Wear a medical alert bracelet in case of an emergency. This will tell any health care providers that you have a port.  Keep all follow-up visits as told by your health care provider. This is important. Contact a health care provider if:  You cannot flush your port with saline as directed, or you cannot draw blood from the port.  You have a fever or chills.  You have redness, swelling, or pain around your port insertion site.  You have fluid or blood coming from your port insertion site.  Your port insertion site feels warm to the touch.  You have pus or a bad smell coming from the port insertion site. Get help right away if:  You have chest pain or shortness of breath.  You have bleeding from your port that you cannot control. Summary  Take care of the port as told by  your health care provider. Keep the manufacturer's information card with you at all times.  Change your dressing as told by your health care provider.  Contact a health care provider if you have a fever or chills or if you have redness, swelling, or pain around your port insertion site.  Keep all follow-up visits as told by your health care provider. This information is not intended to replace advice given to you by your health care provider. Make sure you discuss any questions you have with your health care provider. Document Revised: 08/21/2017 Document Reviewed: 08/21/2017 Elsevier Patient Education   [employer] An implanted port is a device that is placed under the skin. It is usually placed in the chest. The device can be used to give IV medicine, to take blood, or for dialysis. You may have an implanted port if:  You need IV medicine that would be irritating to the small veins in your hands or arms.  You need IV medicines, such as antibiotics, for a long period of time.  You need IV nutrition for a long period of time.  You need dialysis. Having a port means that your health care provider will not need to use the veins in your arms for these procedures. You may have fewer limitations when using a port than you would if you used other types of long-term IVs, and you will likely be able to return to normal activities after your incision heals. An implanted port has two main parts:  Reservoir. The reservoir is the part where a needle is inserted to give medicines or draw blood. The reservoir is round. After it is placed, it appears as a small, raised area under your skin.  Catheter. The catheter is a thin, flexible tube that connects the reservoir to a vein. Medicine that is inserted into the reservoir goes into the catheter and then into the vein. How is my port accessed? To access your port:  A numbing cream may be placed on the skin over the port site.  Your health care provider will put on a mask and sterile gloves.  The skin over your port will be cleaned carefully with a germ-killing soap and allowed to dry.  Your health care provider will gently pinch the port and insert a needle into it.  Your health care provider will check for a blood return to make sure the port is in the vein and is not clogged.  If your port needs to remain accessed to get medicine continuously (constant infusion), your health care provider will place a clear bandage (dressing) over the needle site. The dressing and needle will need to be changed every week, or as told  by your health care provider. What is flushing? Flushing helps keep the port from getting clogged. Follow instructions from your health care provider about how and when to flush the port. Ports are usually flushed with saline solution or a medicine called heparin. The need for flushing will depend on how the port is used:  If the port is only used from time to time to give medicines or draw blood, the port may need to be flushed: ? Before and after medicines have been given. ? Before and after blood has been drawn. ? As part of routine maintenance. Flushing may be recommended every 4-6 weeks.  If a constant infusion is running, the port may not need to be flushed.  Throw away any syringes in a disposal container that  is meant for sharp items (sharps container). You can buy a sharps container from a pharmacy, or you can make one by using an empty hard plastic bottle with a cover. How long will my port stay implanted? The port can stay in for as long as your health care provider thinks it is needed. When it is time for the port to come out, a surgery will be done to remove it. The surgery will be similar to the procedure that was done to put the port in. Follow these instructions at home:   Flush your port as told by your health care provider.  If you need an infusion over several days, follow instructions from your health care provider about how to take care of your port site. Make sure you: ? Wash your hands with soap and water before you change your dressing. If soap and water are not available, use alcohol-based hand sanitizer. ? Change your dressing as told by your health care provider. ? Place any used dressings or infusion bags into a plastic bag. Throw that bag in the trash. ? Keep the dressing that covers the needle clean and dry. Do not get it wet. ? Do not use scissors or sharp objects near the tube. ? Keep the tube clamped, unless it is being used.  Check your port site every day  for signs of infection. Check for: ? Redness, swelling, or pain. ? Fluid or blood. ? Pus or a bad smell.  Protect the skin around the port site. ? Avoid wearing bra straps that rub or irritate the site. ? Protect the skin around your port from seat belts. Place a soft pad over your chest if needed.  Bathe or shower as told by your health care provider. The site may get wet as long as you are not actively receiving an infusion.  Return to your normal activities as told by your health care provider. Ask your health care provider what activities are safe for you.  Carry a medical alert card or wear a medical alert bracelet at all times. This will let health care providers know that you have an implanted port in case of an emergency. Get help right away if:  You have redness, swelling, or pain at the port site.  You have fluid or blood coming from your port site.  You have pus or a bad smell coming from the port site.  You have a fever. Summary  Implanted ports are usually placed in the chest for long-term IV access.  Follow instructions from your health care provider about flushing the port and changing bandages (dressings).  Take care of the area around your port by avoiding clothing that puts pressure on the area, and by watching for signs of infection.  Protect the skin around your port from seat belts. Place a soft pad over your chest if needed.  Get help right away if you have a fever or you have redness, swelling, pain, drainage, or a bad smell at the port site. This information is not intended to replace advice given to you by your health care provider. Make sure you discuss any questions you have with your health care provider. Document Revised: 05/17/2018 Document Reviewed: 02/26/2016 Elsevier Patient Education  2020 [place] After These instructions provide you with information about caring for yourself after your procedure.  Your health care provider may also give you more specific instructions. Your treatment has been planned according  to current medical practices, but problems sometimes occur. Call your health care provider if you have any problems or questions after your procedure. What can I expect after the procedure? After your procedure, you may:  Feel sleepy for several hours.  Feel clumsy and have poor balance for several hours.  Feel forgetful about what happened after the procedure.  Have poor judgment for several hours.  Feel nauseous or vomit.  Have a sore throat if you had a breathing tube during the procedure. Follow these instructions at home: For at least 24 hours after the procedure:      Have a responsible adult stay with you. It is important to have someone help care for you until you are awake and alert.  Rest as needed.  Do not: ? Participate in activities in which you could fall or become injured. ? Drive. ? Use heavy machinery. ? Drink alcohol. ? Take sleeping pills or medicines that cause drowsiness. ? Make important decisions or sign legal documents. ? Take care of children on your own. Eating and drinking  Follow the diet that is recommended by your health care provider.  If you vomit, drink water, juice, or soup when you can drink without vomiting.  Make sure you have little or no nausea before eating solid foods. General instructions  Take over-the-counter and prescription medicines only as told by your health care provider.  If you have sleep apnea, surgery and certain medicines can increase your risk for breathing problems. Follow instructions from your health care provider about wearing your sleep device: ? Anytime you are sleeping, including during daytime naps. ? While taking prescription pain medicines, sleeping medicines, or medicines that make you drowsy.  If you smoke, do not smoke without supervision.  Keep all follow-up visits as told by your  health care provider. This is important. Contact a health care provider if:  You keep feeling nauseous or you keep vomiting.  You feel light-headed.  You develop a rash.  You have a fever. Get help right away if:  You have trouble breathing. Summary  For several hours after your procedure, you may feel sleepy and have poor judgment.  Have a responsible adult stay with you for at least 24 hours or until you are awake and alert. This information is not intended to replace advice given to you by your health care provider. Make sure you discuss any questions you have with your health care provider. Document Revised: 04/23/2017 Document Reviewed: 05/16/2015 Elsevier Patient Education  [place].

## 2019-02-21 NOTE — Transfer of Care (Signed)
Immediate Anesthesia Transfer of Care Note  Patient: Kelli Hurley  Procedure(s) Performed: INSERTION PORT-A-CATH (Left Chest)  Patient Location: PACU  Anesthesia Type:General  Level of Consciousness: awake, alert  and oriented  Airway & Oxygen Therapy: Patient Spontanous Breathing and Patient connected to face mask oxygen  Post-op Assessment: Report given to RN, Post -op Vital signs reviewed and stable and Patient moving all extremities X 4  Post vital signs: Reviewed and stable  Last Vitals:  Vitals Value Taken Time  BP    Temp    Pulse 92 02/21/19 1129  Resp 15 02/21/19 1129  SpO2 97 % 02/21/19 1129  Vitals shown include unvalidated device data.  Last Pain:  Vitals:   02/21/19 0855  TempSrc: Oral  PainSc: 3       Patients Stated Pain Goal: 8 ([id number])  Complications: No apparent anesthesia complications

## 2019-02-21 NOTE — Interval H&P Note (Signed)
History and Physical Interval Note:  02/21/2019 8:51 AM  Kelli Hurley  has presented today for surgery, with the diagnosis of Multiple Myeloma.  The various methods of treatment have been discussed with the patient and family. After consideration of risks, benefits and other options for treatment, the patient has consented to  Procedure(s): INSERTION PORT-A-CATH (Left) as a surgical intervention.  The patient's history has been reviewed, patient examined, no change in status, stable for surgery.  I have reviewed the patient's chart and labs.  Questions were answered to the patient's satisfaction.    No changes. Plan for left side. Virl Cagey

## 2019-02-24 ENCOUNTER — Encounter (HOSPITAL_COMMUNITY): Payer: Self-pay | Admitting: Hematology

## 2019-02-24 ENCOUNTER — Ambulatory Visit (HOSPITAL_COMMUNITY)
Admission: RE | Admit: 2019-02-24 | Discharge: 2019-02-24 | Disposition: A | Discharge status: Home / Self Care | Payer: Medicare Other | Source: Ambulatory Visit | Attending: Hematology | Admitting: Hematology

## 2019-02-24 ENCOUNTER — Other Ambulatory Visit: Payer: Self-pay

## 2019-02-24 DIAGNOSIS — C9000 Multiple myeloma not having achieved remission: Secondary | ICD-10-CM | POA: Insufficient documentation

## 2019-02-24 DIAGNOSIS — R0602 Shortness of breath: Secondary | ICD-10-CM | POA: Diagnosis present

## 2019-02-24 MED ORDER — IOHEXOL 350 MG/ML SOLN
100.0000 mL | Freq: Once | INTRAVENOUS | Status: AC | PRN
  Administered 2019-02-24: 100 mL via INTRAVENOUS

## 2019-02-25 ENCOUNTER — Other Ambulatory Visit (HOSPITAL_COMMUNITY): Payer: Self-pay | Admitting: Hematology

## 2019-02-25 DIAGNOSIS — Z7189 Other specified counseling: Secondary | ICD-10-CM | POA: Insufficient documentation

## 2019-02-25 NOTE — Progress Notes (Signed)
Patient on plan of care prior to pathways. 

## 2019-02-25 NOTE — Progress Notes (Signed)
START ON PATHWAY REGIMEN - Multiple Myeloma and Other Plasma Cell Dyscrasias     A cycle is every 28 days:     Carfilzomib      Carfilzomib      Carfilzomib      Pomalidomide      Dexamethasone   **Always confirm dose/schedule in your pharmacy ordering system**  Patient Characteristics: Relapsed / Refractory, Second through Fourth Lines of Therapy R-ISS Staging: II Disease Classification: Relapsed Line of Therapy: Second Line Intent of Therapy: Non-Curative / Palliative Intent, Discussed with Patient

## 2019-02-26 ENCOUNTER — Inpatient Hospital Stay (HOSPITAL_BASED_OUTPATIENT_CLINIC_OR_DEPARTMENT_OTHER): Payer: Medicare Other | Admitting: Hematology

## 2019-02-26 ENCOUNTER — Other Ambulatory Visit: Payer: Self-pay

## 2019-02-26 VITALS — BP 159/78 | HR 128 | Temp 97.5°F | Resp 40 | Wt 361.0 lb

## 2019-02-26 DIAGNOSIS — C9000 Multiple myeloma not having achieved remission: Secondary | ICD-10-CM

## 2019-02-26 DIAGNOSIS — R0602 Shortness of breath: Secondary | ICD-10-CM

## 2019-02-26 MED ORDER — POMALIDOMIDE 4 MG PO CAPS: 4.0000 mg | ORAL_CAPSULE | Freq: Every day | ORAL | 0 refills | Status: DC

## 2019-02-26 MED ORDER — ACYCLOVIR 400 MG PO TABS: 400.0000 mg | ORAL_TABLET | Freq: Two times a day (BID) | ORAL | 5 refills | Status: DC

## 2019-02-26 MED ORDER — LIDOCAINE-PRILOCAINE 2.5-2.5 % EX CREA: TOPICAL_CREAM | CUTANEOUS | 5 refills | Status: DC

## 2019-02-26 NOTE — Assessment & Plan Note (Addendum)
1. 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 -Maintenance [place] 3 weeks on 1 week off from March 2018 through December 2020 with progression. -She was last seen by me on 10/21/2018 when her M spike was 0.7 g. Subsequently had M spike has gone up. -Restaging PET scan on 02/10/2019 was negative for any osseous or extraosseous lesions. -Bone marrow biopsy on 02/14/2019 shows 10 to 15% plasma cells. Chromosome analysis was normal. FISH panel shows del 17 P, deletion 13 q., duplication 1 q. -Labs on 01/27/2019 shows M spike of 1 g/dL. Free light chain ratio is 2.69. LDH is 190. Hemoglobin is 11.2. Creatinine is 1.16. Calcium is 9.9. Albumin is 3.9. Kappa light chains are 42 and lambda light chains are 15.6. -We talked about salvage therapy with carfilzomib, pomalidomide and dexamethasone. She already has a port placed. -We discussed the side effects and regimen in detail. She will proceed with her treatment as soon as we get authorization from insurance. -We will check beta-2 microglobulin and uric acid levels on day of therapy.  I will also request [place] red staining on bone marrow biopsy.  2. Shortness of breath on exertion: -She reports worsening shortness of breath on exertion in the last 3 months. -2D echocardiogram on 03/09/2019 shows EF 60-65%. No left ventricular hypertrophy. Left ventricular internal cavity size was small. -CT chest PE protocol on 02/24/2019 shows no large pulmonary embolism. Stable 8 mm perifissural nodule in the right lung consistent with benign etiology. -We have checked her oxygen today at room temperature which was normal. Upon exertion she dropped to 87-88 %. She will benefit from oxygen therapy at home. Heart rate has gone up by 40 to 50 bpm. -We will arrange for a cardiac MRI to evaluate for amyloid involvement. We will also check proBNP, troponin T and troponin  I.  3. ID prophylaxis: -We have sent a prescription for acyclovir 400 mg twice daily. She will continue 81 mg aspirin.  4. Neuropathy: -Numbness in the right hand fingertips and on and off in the right foot. She wears a glove out to the right hand. She reports burning type pain when she takes of the globe. -She is continuing gabapentin 300 mg twice daily.

## 2019-02-26 NOTE — Progress Notes (Signed)
[facility], [place] [address]   CLINIC:  Medical Oncology/Hematology  PCP:  Abran Richard, MD [address] [place] [address] [phone]   REASON FOR VISIT:  Follow-up for multiple myeloma   BRIEF ONCOLOGIC HISTORY:  Oncology History  Multiple myeloma ([place])  08/07/2014 Imaging   Bone Survey- No lytic lesions are noted in the visualized skeleton.   09/03/2014 Bone Marrow Biopsy   NORMOCELLULAR BONE MARROW FOR AGE WITH PLASMA CELL NEOPLASM.  The plasma cell component is increased in the marrow representing an estimated 18% of all cells. Cytogenetics with 13q-, 17p- (high risk disease)   09/03/2014 Pathology Results   Cytogenetics with 13q-, 17p- (high risk disease)   09/23/2014 Initial Diagnosis   Multiple myeloma   09/30/2014 PET scan   No abnormal hypermetabolism in the neck, chest, abdomen or pelvis.   10/05/2014 - 03/22/2015 Chemotherapy   RVD   10/28/2014 Treatment Plan Change   Issues related to getting Revlimid in a timely fashion, therefore, she received her Revlimid on 9/21 resulting in a 12 day cycle this time instead of a 14 day cycle   11/02/2014 Imaging   CTA chest- No evidence for a large or central pulmonary embolism as described.  8 mm density along the right minor fissure could represent focal pleural thickening but indeterminate. If the patient is at high risk for bronchogenic carcinoma, follow-up c   11/23/2014 Miscellaneous   Zometa 4 mg IV monthly   04/07/2015 Bone Marrow Biopsy   Normocellular marrow with 2-4% clonal plasma cells by immunohistochemistry. FISH and cytogenetics were normal [facility])     04/2015 Miscellaneous   PRETRANSPLANT EVALUATION:  Pulmonary function tests: FEV1 100.3% / DLCO 97.9%  Echocardiogram: Normal LV function with EF 60-65%    04/27/2015 Procedure   Stem cell mobilization with filgrastim and Mozobil [facility])   05/06/2015 Miscellaneous   BMT conditioning regimen with high-dose  Melphalan given [facility] [name]); Day -1   05/07/2015 Bone Marrow Transplant   Outpatient autologous stem cell transplant [facility], [name]); Day 0   05/18/2015 Miscellaneous   WBC engraftment;  did not require platelet transfusion during her transplant process. Lowest platelet count 28,000    05/20/2015 Procedure   Tunneled catheter removed [facility])   09/08/2015 -  Chemotherapy   Velcade every 2 weeks   12/15/2015 Miscellaneous   Zometa re-instituted.    12/23/2015 Imaging   Bone density- BMD as determined from Forearm Radius 33% is 0.799 g/cm2 with a T-Score of 1.2. This patient is considered normal according to [facility]) criteria.   04/17/2016 Miscellaneous   Started maintenance with Ninlaro.   05/04/2016 Treatment Plan Change   Zometa switched to Xgeva injection monthly given difficult IV access.    03/05/2019 -  Chemotherapy   The patient had carfilzomib (KYPROLIS) 44 mg in dextrose 5 % 50 mL chemo infusion, 20 mg/m2, Intravenous,  Once, 0 of 4 cycles Dose modification: 56 mg/m2 (Cycle 1, Reason: Provider Judgment)  for chemotherapy treatment.       CANCER STAGING: Cancer Staging Multiple myeloma [facility]) Staging form: Multiple Myeloma, AJCC 6th Edition - Clinical stage from 10/26/2014: Stage IIA - Signed by Baird Cancer, PA-C on 10/26/2014 - Pathologic: No stage assigned - Unsigned    INTERVAL HISTORY:  Ms. Milewski 64 y.o. female seen for follow-up of multiple myeloma. She has recent progression on Ninlaro. She was also complaining of worsening shortness of breath on exertion for  last 3 months. She reports appetite 100%. Energy levels are 50%. Pain in the knees is rated as 8 out of 10. Reported burning sensation in the right hand. Denies any fevers or night sweats. She had CT scan of the chest as well as echocardiogram done.   REVIEW OF SYSTEMS:  Review of Systems  Respiratory: Positive for shortness of breath.     Musculoskeletal: Positive for arthralgias.  Neurological: Positive for numbness.  All other systems reviewed and are negative.    PAST MEDICAL/SURGICAL HISTORY:  Past Medical History:  Diagnosis Date  . Anemia   . Arthritis   . Cancer ([place])   . Claustrophobia 10/05/2014  . Hypertension   . Leukopenia 08/03/2014  . Normocytic hypochromic anemia 08/03/2014  . Renal disorder    stage 3    Past Surgical History:  Procedure Laterality Date  . ABDOMINAL HYSTERECTOMY    . CARDIAC SURGERY    . OTHER SURGICAL HISTORY     heart surgery as infant to "repair hole in heart"  . PORTACATH PLACEMENT Left 02/21/2019   Procedure: INSERTION PORT-A-CATH;  Surgeon: Virl Cagey, MD;  Location: [facility];  Service: General;  Laterality: Left;     SOCIAL HISTORY:  Social History   Socioeconomic History  . Marital status: Legally Separated    Spouse name: Not on file  . Number of children: Not on file  . Years of education: Not on file  . Highest education level: Not on file  Occupational History  . Not on file  Tobacco Use  . Smoking status: Never Smoker  . Smokeless tobacco: Never Used  Substance and Sexual Activity  . Alcohol use: No  . Drug use: No  . Sexual activity: Not on file    Comment: divorced- 2 daughters  Other Topics Concern  . Not on file  Social History Narrative  . Not on file   Social Determinants of Health   Financial Resource Strain:   . Difficulty of Paying Living Expenses: Not on file  Food Insecurity:   . Worried About Charity fundraiser in the Last Year: Not on file  . Ran Out of Food in the Last Year: Not on file  Transportation Needs:   . Lack of Transportation (Medical): Not on file  . Lack of Transportation (Non-Medical): Not on file  Physical Activity:   . Days of Exercise per Week: Not on file  . Minutes of Exercise per Session: Not on file  Stress:   . Feeling of Stress : Not on file  Social Connections:   . Frequency of Communication  with Friends and Family: Not on file  . Frequency of Social Gatherings with Friends and Family: Not on file  . Attends Religious Services: Not on file  . Active Member of Clubs or Organizations: Not on file  . Attends Archivist Meetings: Not on file  . Marital Status: Not on file  Intimate Partner Violence:   . Fear of Current or Ex-Partner: Not on file  . Emotionally Abused: Not on file  . Physically Abused: Not on file  . Sexually Abused: Not on file    FAMILY HISTORY:  Family History  Problem Relation Age of Onset  . Cancer Mother   . Hypertension Mother   . Cancer Father   . Hypertension Father   . Cancer Maternal Grandmother   . Hypertension Sister   . Hypertension Brother     CURRENT MEDICATIONS:  Outpatient Encounter Medications as of  02/26/2019  Medication Sig  . acetaminophen (TYLENOL 8 HOUR ARTHRITIS PAIN) 650 MG CR tablet Take 650 mg by mouth every 8 (eight) hours as needed for pain.  . Ascorbic Acid (VITAMIN C) 1000 MG tablet Take 1,000 mg by mouth daily.   Marland Kitchen aspirin EC 81 MG tablet Take 81 mg by mouth daily.  . cholecalciferol (VITAMIN D) 1000 units tablet Take 1,000 Units by mouth daily.  Marland Kitchen gabapentin (NEURONTIN) 300 MG capsule Take 1 capsule (300 mg total) by mouth 3 (three) times daily. (Patient taking differently: Take 300 mg by mouth 2 (two) times daily. )  . hydrochlorothiazide (HYDRODIURIL) 25 MG tablet TAKE 1 TABLET BY MOUTH ONCE DAILY. (Patient taking differently: Take 25 mg by mouth daily. )  . ibuprofen (ADVIL) 400 MG tablet Take 1 tablet (400 mg total) by mouth every 8 (eight) hours as needed.  . Multiple Vitamin (TAB-A-VITE) TABS TAKE 1 TABLET BY MOUTH ONCE DAILY. (Patient taking differently: Take 1 tablet by mouth daily. )  . trolamine salicylate (ASPERCREME) 10 % cream Apply 1 application topically 2 (two) times daily.  . [DISCONTINUED] acyclovir (ZOVIRAX) 800 MG tablet Take 1 tablet (800 mg total) by mouth 2 (two) times daily.  Marland Kitchen acyclovir  (ZOVIRAX) 400 MG tablet Take 1 tablet (400 mg total) by mouth 2 (two) times daily.  Marland Kitchen albuterol (VENTOLIN HFA) 108 (90 Base) MCG/ACT inhaler Inhale 2 puffs into the lungs every 6 (six) hours as needed for wheezing or shortness of breath. (Patient not taking: Reported on 02/26/2019)  . lidocaine-prilocaine (EMLA) cream Apply a quarter sized glob 1 hour prior to treatment.  . pomalidomide (POMALYST) 4 MG capsule Take 1 capsule (4 mg total) by mouth daily.  . prochlorperazine (COMPAZINE) 10 MG tablet Take 1 tablet (10 mg total) by mouth every 6 (six) hours as needed for nausea or vomiting. (Patient not taking: Reported on 02/13/2019)  . [DISCONTINUED] HYDROcodone-acetaminophen (NORCO) 5-325 MG tablet Take 1 tablet by mouth every 4 (four) hours as needed for moderate pain.  . [DISCONTINUED] ibuprofen (ADVIL) 200 MG tablet Take 800 mg by mouth every 8 (eight) hours as needed (pain.).   Facility-Administered Encounter Medications as of 02/26/2019  Medication  . heparin lock flush 100 unit/mL  . sodium chloride 0.9 % injection 10 mL    ALLERGIES:  Allergies  Allergen Reactions  . Diclofenac Swelling    Per pt, facial swelling     PHYSICAL EXAM:  ECOG Performance status: 1  Vitals:   02/26/19 1543 02/26/19 1548  BP:    Pulse: (!) 128   Resp: (!) 40   Temp:    SpO2: (!) 88% 100%   Filed Weights   02/26/19 1500  Weight: (!) 361 lb (163.7 kg)    Physical Exam Vitals reviewed.  Constitutional:      Appearance: Normal appearance.  Cardiovascular:     Rate and Rhythm: Normal rate and regular rhythm.     Heart sounds: Normal heart sounds.  Pulmonary:     Effort: Pulmonary effort is normal.     Breath sounds: Normal breath sounds.  Abdominal:     General: There is no distension.     Palpations: Abdomen is soft. There is no mass.  Musculoskeletal:        General: No swelling.  Skin:    General: Skin is warm.  Neurological:     General: No focal deficit present.     Mental Status:  She is alert and oriented to person, place, and  time.  Psychiatric:        Mood and Affect: Mood normal.        Behavior: Behavior normal.   Trace edema bilaterally.   LABORATORY DATA:  I have reviewed the labs as listed.  CBC    Component Value Date/Time   WBC 6.1 02/14/2019 0723   RBC 3.72 (L) 02/14/2019 0723   HGB 11.2 (L) 02/14/2019 0723   HCT 35.5 (L) 02/14/2019 0723   PLT 177 02/14/2019 0723   MCV 95.4 02/14/2019 0723   MCH 30.1 02/14/2019 0723   MCHC 31.5 02/14/2019 0723   RDW 14.4 02/14/2019 0723   LYMPHSABS 1.5 02/14/2019 0723   MONOABS 0.5 02/14/2019 0723   EOSABS 0.0 02/14/2019 0723   BASOSABS 0.0 02/14/2019 0723   CMP Latest Ref Rng & Units 01/27/2019 12/23/2018 11/13/2018  Glucose 70 - 99 mg/dL 102(H) 111(H) 116(H)  BUN 8 - 23 mg/dL 28(H) 25(H) 23  Creatinine 0.44 - 1.00 mg/dL 1.16(H) 1.14(H) 1.10(H)  Sodium 135 - 145 mmol/L 137 136 137  Potassium 3.5 - 5.1 mmol/L 4.1 3.9 4.2  Chloride 98 - 111 mmol/L 99 99 98  CO2 22 - 32 mmol/L _0 Calcium 8.9 - 10.3 mg/dL 9.9 10.0 9.9  Total Protein 6.5 - 8.1 g/dL 8.1 8.4(H) 8.4(H)  Total Bilirubin 0.3 - 1.2 mg/dL 0.5 0.6 0.4  Alkaline Phos 38 - 126 U/L 47 54 57  AST 15 - 41 U/L _1 ALT 0 - 44 U/L _2 DIAGNOSTIC IMAGING:  I have independently reviewed the scans and discussed with the patient.   I have reviewed Venita Lick LPN's note and agree with the documentation.  I personally performed a face-to-face visit, made revisions and my assessment and plan is as follows.    ASSESSMENT & PLAN:   Multiple myeloma ([place]) 1. 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 -Maintenance Ninlaro 3 weeks on 1 week off from March 2018 through December 2020 with progression. -She was last seen by me on 10/21/2018 when her M spike was 0.7 g. Subsequently had M spike has gone up. -Restaging PET  scan on 02/10/2019 was negative for any osseous or extraosseous lesions. -Bone marrow biopsy on 02/14/2019 shows 10 to 15% plasma cells. Chromosome analysis was normal. FISH panel shows del 17 P, deletion 13 q., duplication 1 q. -Labs on 01/27/2019 shows M spike of 1 g/dL. Free light chain ratio is 2.69. LDH is 190. Hemoglobin is 11.2. Creatinine is 1.16. Calcium is 9.9. Albumin is 3.9. Kappa light chains are 42 and lambda light chains are 15.6. -We talked about salvage therapy with carfilzomib, pomalidomide and dexamethasone. She already has a port placed. -We discussed the side effects and regimen in detail. She will proceed with her treatment as soon as we get authorization from insurance. -We will check beta-2 microglobulin and uric acid levels on day of therapy.  I will also request [place] red staining on bone marrow biopsy.  2. Shortness of breath on exertion: -She reports worsening shortness of breath on exertion in the last 3 months. -2D echocardiogram on 03/09/2019 shows EF 60-65%. No left ventricular hypertrophy. Left ventricular internal cavity size was small. -CT chest PE protocol on 02/24/2019 shows no large pulmonary embolism. Stable 8 mm perifissural nodule in the right lung consistent with benign etiology. -We have checked her oxygen today  at room temperature which was normal. Upon exertion she dropped to 87-88 %. She will benefit from oxygen therapy at home. Heart rate has gone up by 40 to 50 bpm. -We will arrange for a cardiac MRI to evaluate for amyloid involvement. We will also check proBNP, troponin T and troponin I.  3. ID prophylaxis: -We have sent a prescription for acyclovir 400 mg twice daily. She will continue 81 mg aspirin.  4. Neuropathy: -Numbness in the right hand fingertips and on and off in the right foot. She wears a glove out to the right hand. She reports burning type pain when she takes of the globe. -She is continuing gabapentin 300 mg twice daily.   Orders placed  this encounter:  Orders Placed This Encounter  Procedures  . MR CARDIAC MORPHOLOGY W WO CONTRAST  . CBC with Differential/Platelet  . Comprehensive metabolic panel  . Beta 2 microglobuline, serum  . Uric acid  . PT AND PTT  . Troponin T  . Troponin I  . Pro b natriuretic peptide   Total time spent is 40 minutes with more than 80% of the time spent face-to-face discussing treatment plan change, counseling and coordination of care.   Derek Jack, MD [place] [phone]

## 2019-02-26 NOTE — Patient Instructions (Addendum)
[place] at [facility] Discharge Instructions  You were seen today by Dr. Delton Coombes. He went over your recent lab, scan and test results. Your PET scan results were good with no new findings. You will need a MRI of your heart to further evaluate the changes noted on your CT. Continue taking the gabapentin to help with the numbness in your hand. He discussed your myeloma numbers from Oct, Nov, and Dec. He discussed your myeloma with you and the grade of your disease. He discussed your treatment regimen, Kyprolis and dexamethasone and Pomalyst. We will schedule you for chemo teaching prior to your first treatment. He will see you back in 1 week for labs, treatment and follow up.   Thank you for choosing [place] at [facility] to provide your oncology and hematology care.  To afford each patient quality time with our provider, please arrive at least 15 minutes before your scheduled appointment time.   If you have a lab appointment with the [facility] please come in thru the  Main Entrance and check in at the main information desk  You need to re-schedule your appointment should you arrive 10 or more minutes late.  We strive to give you quality time with our providers, and arriving late affects you and other patients whose appointments are after yours.  Also, if you no show three or more times for appointments you may be dismissed from the clinic at the providers discretion.     Again, thank you for choosing [facility].  Our hope is that these requests will decrease the amount of time that you wait before being seen by our physicians.       _____________________________________________________________  Should you have questions after your visit to [facility], please contact our office at [phone] between the hours of 8:00 a.m. and 4:30 p.m.  Voicemails left after 4:00 p.m. will not be returned until the following  business day.  For prescription refill requests, have your pharmacy contact our office and allow 72 hours.    Cancer Center Support Programs:   > Cancer Support Group  2nd Tuesday of the month 1pm-2pm, Journey Room

## 2019-02-26 NOTE — Progress Notes (Signed)
Patient was ambulated with and without oxygen to assess shortness of breath and oxygen level due to complaints of recent shortness of breath with activity. Ambulating without oxygen 88%. Pt's oxygen did return to 98-100% post ambulation/resting. Patient's heart rate increased by 48 beats per minute while ambulating. Resting heart rate = 80 beats per minute and ambulating 128 beats per minute. Respiratory rate increased to 40 breaths per minute when ambulating.   Oxygen will not be easily obtained for home usage and may not be approved at all. I was notified through [place] that it would take anywhere from 7 to 21 days to hopefully obtain authorization with the diagnosis code Left atrial dilation. Chemo starts next Wednesday which we are expecting to help with the shortness of breath. This was discussed with [name] in which she spoke with her mom and her mom was okay with not getting oxygen at this point in time and agrees to continue walking but not to go for long distances - distances that increase shortness of breath and decrease oxygen sats. Dr. Delton Coombes made aware of all of the above in regards to the oxygen for home use and patient's decision to wait and see how the chemo goes.

## 2019-02-27 ENCOUNTER — Encounter (HOSPITAL_COMMUNITY): Payer: Self-pay | Admitting: *Deleted

## 2019-02-27 ENCOUNTER — Ambulatory Visit (HOSPITAL_COMMUNITY): Payer: Medicare Other | Admitting: Hematology

## 2019-02-27 ENCOUNTER — Other Ambulatory Visit (HOSPITAL_COMMUNITY): Payer: Self-pay | Admitting: Nurse Practitioner

## 2019-02-27 DIAGNOSIS — F418 Other specified anxiety disorders: Secondary | ICD-10-CM

## 2019-02-27 MED ORDER — LORAZEPAM 1 MG PO TABS: 1.0000 mg | ORAL_TABLET | ORAL | 0 refills | Status: DC

## 2019-02-27 NOTE — Progress Notes (Signed)
Per Dr. Delton Coombes, Congo red stain requested for bone marrow biopsy done on 02/14/19 [id number].   I emailed pathology and requested this to be done.

## 2019-02-27 NOTE — Patient Instructions (Addendum)
[place] are diagnosed with relapsed multiple myeloma.  You will be treated with a drug called carfilzomib (Kyprolis).  Your schedule for treatment will be weekly x 3 weeks with 1 week off prior to returning for your next cycle of treatment.  The intent of treatment is to control your disease.  You will see the doctor regularly throughout treatment.  We will obtain blood work from you prior to every treatment and monitor your results to make sure it is safe to give your treatment. The doctor monitors your response to treatment by the way you are feeling, your blood work, and by obtaining scans periodically.  There will be wait times while you are here for treatment.  It will take about 30 minutes to 1 hour for your lab work to result.  Then there will be wait times while pharmacy mixes your medications.    Carfilzomib (Kyprolis)  About This Drug Carfilzomib is used to treat cancer. It is given in the vein (IV).  Possible Side Effects . Bone marrow suppression. This is a decrease in the number of white blood cells, red blood cells, and platelets. This may raise your risk of infection, make you tired and weak (fatigue), and raise your risk of bleeding.  . Nausea  . Diarrhea (loose bowel movements)  . Tiredness  . Swelling of your legs, ankles and/or feet  . Fever  . Headache  . Trouble sleeping  . Cough and trouble breathing  . Upper respiratory infection  . High blood pressure  Note: Each of the side effects above was reported in 20% or greater of patients treated with carfilzomib alone or in combination with other medications. Not all possible side effects are included above.  Warnings and Precautions  . Congestive heart failure and/or risk of heart attack which can be life-threatening  . Changes in your kidney function which can cause kidney failure and be life-threatening  . Tumor lysis syndrome. This drug may act on the cancer  cells very quickly. This may affect how your kidneys work. Tumor lysis can be life-threatening.  . Inflammation (swelling) or scarring of the lungs or fluid build-up in your lungs, which can be lifethreatening. You may have a dry cough or trouble breathing which may be severe.  . High blood pressure in arteries of the lungs  . High blood pressure, which can be life-threatening  . Blood clots and events such as heart attack. A blood clot in your leg may cause your leg to swell, appear red and warm, and/or cause pain. A blood clot in your lungs may cause trouble breathing, pain when breathing, and/or chest pain. If you use hormonal contraception, discuss choice of contraception and risk of blood clots with your medical team.  . While you are getting this drug in your vein (IV), you may have a reaction to the drug, which can be life-threatening. Sometimes you may be given medication to stop or lessen these side effects. Your nurse will check you closely for these signs: fever or shaking chills, flushing, facial swelling, feeling dizzy, headache, trouble breathing, rash, itching, chest tightness, or chest pain. These reactions may happen after your infusion. If this happens, call 911 for emergency care.  . Abnormal bleeding which can be life-threatening - symptoms may be coughing up blood, throwing up blood (may look like coffee grounds), red or black tarry bowel movements, abnormally heavy menstrual flow, nosebleeds, or any other unusual bleeding.  . Severe decrease  in platelets which can increase your risk of bleeding  . Rare blood vessels disorder called thrombotic microangiopathy which can affect some of your organs in your body such as your kidneys and can be life-threatening . Changes in your liver function, which can cause liver failure and be life-threatening  . Changes in your central nervous system can happen. The central nervous system is made up of your brain and spinal cord. You could  feel extreme tiredness, agitation, confusion, hallucinations (see or hear things that are not there), trouble understanding or speaking, loss of control of your bowels or bladder, eyesight changes, numbness, or lack of strength to your arms, legs, face, or body, and coma. If you start to have any of these symptoms let your doctor know right away.  Note: Some of the side effects above are very rare. If you have concerns and/or questions, please discuss them with your medical team.  Important Information  . This drug may be present in the saliva, tears, sweat, urine, stool, vomit, semen, and vaginal secretions. Talk to your doctor and/or your nurse about the necessary precautions to take during this time.  . This drug may impair your ability to drive or use machinery. Use caution and tell your nurse or doctor if you feel dizzy, very sleepy, and/or experience low blood pressure.  Treating Side Effects  . Manage tiredness by pacing your activities for the day.  . Be sure to include periods of rest between energy-draining activities.  . To decrease the risk of infection, wash your hands regularly.  . Avoid close contact with people who have a cold, the flu, or other infections.  . Take your temperature as your doctor or nurse tells you, and whenever you feel like you may have a fever.  . To help decrease the risk of bleeding, use a soft toothbrush. Check with your nurse before using dental floss.  . Be very careful when using knives or tools.  . Use an electric shaver instead of a razor.  . Drink plenty of fluids (a minimum of eight glasses per day is recommended).  . If you throw up or have loose bowel movements, you should drink more fluids so that you do not become dehydrated (lack of water in the body from losing too much fluid).  . To help with nausea and vomiting, eat small, frequent meals instead of three large meals a day. Choose foods and drinks that are at room temperature. Ask  your nurse or doctor about other helpful tips and medicine that is available to help stop or lessen these symptoms.  . If you have diarrhea, eat low-fiber foods that are high in protein and calories and avoid foods that can irritate your digestive tracts or lead to cramping.  . Ask your nurse or doctor about medicine that can lessen or stop your diarrhea.  Marland Kitchen Keeping your pain under control is important to your well-being. Please tell your doctor or nurse if you are experiencing pain.  . If you are having trouble sleeping, talk to your nurse or doctor on tips to help you sleep better.  . Infusion reactions may occur after your infusion. If this happens, call 911 for emergency care.  Food and Drug Interactions  . There are no known interactions of carfilzomib with food.  . This drug may interact with other medicines. Tell your doctor and pharmacist about all the prescription and over-the-counter medicines and dietary supplements (vitamins, minerals, herbs, and others) that you are taking at  this time. Also, check with your doctor or pharmacist before starting any new prescription or over-the-counter medicines, or dietary supplements to make sure that there are no interactions.  When to Call the Doctor  Call your doctor or nurse if you have any of these symptoms and/or any new or unusual symptoms:  . Fever of 100.4 F (38 C) or higher  . Chills  . Tiredness that interferes with your daily activities  . A headache that does not go away  . Blurry vision or other changes in eyesight  . Confusion and/or agitation  . Hallucinations  . Trouble understanding or speaking  . Numbness or lack of strength to your arms, legs, face, or body  . Feeling dizzy or lightheaded  . Trouble falling or staying asleep  . Easy bleeding or bruising  . Dry cough or coughing up yellow, green, or bloody mucus  . Wheezing or trouble breathing  . Lips or skin turn a bluish color  . Symptoms of a  seizure such as confusion, blacking out, passing out, loss of hearing or vision, blurred vision, unusual smells or tastes (such as burning rubber), trouble talking, tremors or shaking in parts or all of the body, repeated body movements, tense muscles that do not relax, and loss of control of urine and bowels. If you or your family member suspects you are having a seizure, call 911 right away.  . Chest pain or symptoms of a heart attack. Most heart attacks involve pain in the center of the chest that lasts more than a few minutes. The pain may go away and come back or it can be constant. It can feel like pressure, squeezing, fullness, or pain. Sometimes pain is felt in one or both arms, the back, neck, jaw, or stomach. If any of these symptoms last 2 minutes, call 911.  Marland Kitchen Feeling that your heart is beating fast or in a not normal way (palpitations)  . Nausea that stops you from eating or drinking and/or is not relieved by prescribed medicines  . Throwing up  . Diarrhea, 4 times in one day or diarrhea with lack of strength or a feeling of being dizzy  . Your leg or arm is swollen, red, warm, and/or painful  . Swelling of arms, hands, legs and/or feet  . Weight gain of 5 pounds in one week (fluid retention)  . Blood in your urine, vomit (bright red or coffee-ground) and/or stools (bright red, or black/tarry)  . Decreased urine or very dark urine  . Signs of infusion reaction: fever or shaking chills, flushing, facial swelling, feeling dizzy, headache, trouble breathing, rash, itching, chest tightness, or chest pain. If this happens, call 911 for emergency care.  . Signs of possible liver problems: dark urine, pale bowel movements, bad stomach pain, feeling very tired and weak, unusual itching, or yellowing of the eyes or skin   . Signs of tumor lysis: confusion or agitation, decreased urine, nausea/vomiting, diarrhea, muscle cramping, numbness and/or tingling, or seizures  . Pain that does  not go away or is not relieved by prescribed medicine  . If you think you may be pregnant or may have impregnated your partner  Reproduction Warnings  . Pregnancy warning: This drug can have harmful effects on the unborn baby. Women of childbearing potential should use effective methods of birth control during your cancer treatment and for at least 6 months after treatment. Men with female partners of childbearing potential should use effective methods of birth control during  your cancer treatment and for at least 3 months after your cancer treatment. Let your doctor know right away if you think you may be pregnant or may have impregnated your partner.  . Breastfeeding warning: Women should not breastfeed during treatment and for 2 weeks after treatment because this drug could enter the breast milk and cause harm to a breastfeeding baby.  . Fertility warning: In men and women both, this drug may affect your ability to have children in the future. Talk with your doctor or nurse if you plan to have children. Ask for information on sperm or egg banking.  Dexamethasone (Decadron)  About This Drug Dexamethasone is used to treat cancer, to decrease inflammation and sometimes used before and after chemotherapy to prevent or treat nausea and/or vomiting. It is given in the vein (IV) or orally (by mouth).  Possible Side Effects  . Headache  . High blood pressure  . Abnormal heart beat  . Tiredness and weakness  . Changes in mood, which may include depression or a feeling of extreme well-being  . Trouble sleeping  . Increased sweating  . Increased appetite (increased hunger)  . Weight gain  . Increase risk of infections  . Pain in your abdomen  . Nausea  . Skin changes such as rash, dryness, redness  . Blood sugar levels may change  . Electrolyte changes  . Swelling of your legs, ankles and/or feet  . Changes in your liver function  . You may be at risk for cataracts,  glaucoma or infections of the eye  . Muscle loss and / or weakness (lack of muscle strength)  . Increased risk of developing osteoporosis- your bones may become weak and brittle  Note: Not all possible side effects are included above.  Warnings and Precautions  . This drug may cause you to feel irritable, nervous or restless.  . Allergic reactions, including anaphylaxis are rare but may happen in some patients. Signs of allergic reaction to this drug may be swelling of the face, feeling like your tongue or throat are swelling, trouble breathing, rash, itching, fever, chills, feeling dizzy, and/or feeling that your heart is beating in a fast or not normal way. If this happens, do not take another dose of this drug. You should get urgent medical treatment.  . High blood pressure and changes in electrolytes, which can cause fluid build-up around your heart, lungs or elsewhere.  . Increased risk of developing a hole in your stomach, small, and/or large intestine if you have ulcers in the lining of your stomach and/or intestine, or have diverticulitis, ulcerative colitis and/or other diseases that affect the gastrointestinal tract.  . Effects on the endocrine glands including the pituitary, adrenals or thyroid during or after use of this medication.  . Changes in the tissue of the heart, that can cause your heart to have less ability to pump blood. You may be short of breath or our arms, hands, legs and feet may swell.  . Increased risk of heart attack.  . Severe depression and other psychiatric disorders such as mood changes.  . Burning, pain and itching around your anus may happen when this drug is given in the vein too rapidly (IV). It usually happens suddenly and resolves in less than 1 minute.  Important Information  . Talk to your doctor or your nurse before stopping this medication, it should be stopped gradually.  Depending on the dose and length of treatment, you could experience  serious side effects if  stopped abruptly (suddenly).  . Talk to your doctor before receiving any vaccinations during your treatment. Some vaccinations are not recommended while receiving dexamethasone.  How to Take Your Medication . For Oral (by mouth): You can take the medicine with or without food. If you have nausea or upset stomach, take it with food.  . Missed dose: If you miss a dose, do not take 2 doses at the same time or extra doses. . If you vomit a dose, take your next dose at the regular time. Do not take 2 doses at the same time  . Handling: Wash your hands after handling your medicine, your caretakers should not handle your medicine with bare hands and should wear latex gloves.  . Storage: Store this medicine in the original container at room temperature. Protect from moisture and light. Discuss with your nurse or your doctor how to dispose of unused medicine.  Treating Side Effects . Drink plenty of fluids (a minimum of eight glasses per day is recommended).  . To help with nausea and vomiting, eat small, frequent meals instead of three large meals a day. Choose foods and drinks that are at room temperature. Ask your nurse or doctor about other helpful tips and medicine that is available to help stop or lessen these symptoms.   . If you throw up, you should drink more fluids so that you do not become dehydrated (lack of water in the body from losing too much fluid).  . Manage tiredness by pacing your activities for the day.  . Be sure to include periods of rest between energy-draining activities.  . To help with muscle weakness, get regular exercise. If you feel too tired to exercise vigorously, try taking a short walk.  . If you are having trouble sleeping, talk to your nurse or doctor on tips to help you sleep better.  . If you are feeling depressed, talk to your nurse or doctor about it.  Marland Kitchen Keeping your pain under control is important to your well-being. Please tell  your doctor or nurse if you are experiencing pain.  . If you have diabetes, keep good control of your blood sugar level. Tell your nurse or your doctor if your glucose levels are higher or lower than normal.  . To decrease the risk of infection, wash your hands regularly.  . Avoid close contact with people who have a cold, the flu, or other infections.  . Take your temperature as your doctor or nurse tells you, and whenever you feel like you may have a fever.  . If you get a rash do not put anything on it unless your doctor or nurse says you may. Keep the area around the rash clean and dry. Ask your doctor for medicine if your rash bothers you.  . Moisturize your skin several times day.  . Avoid sun exposure and apply sunscreen routinely when outdoors.  Food and Drug Interactions  . There are no known interactions of dexamethasone with food.  . Check with your doctor or pharmacist about all other prescription medicines and over-the-counter medicines and dietary supplements (vitamins, minerals, herbs and others) you are taking before starting this medicine as there are known drug interactions with dexamethasone. Also, check with your doctor or pharmacist before starting any new prescription or over-the-counter medicines, or dietary supplement to make sure that there are no interactions.  . There are known interactions of dexamethasone with other medicines and products like acetaminophen, aspirin, and ibuprofen. Ask your doctor what  over-the-counter (OTC) medicines you can take.  When to Call the Doctor  Call your doctor or nurse if you have any of these symptoms and/or any new or unusual symptoms:  . Fever of 100.4 F (38 C) or higher  . Chills  . A headache that does not go away  . Trouble breathing  . Blurry vision or other changes in eyesight  . Feel irritable, nervous or restless  . Trouble falling or staying asleep  . Severe mood changes such as depression or unusual  thoughts and/or behaviors  . Thoughts of hurting yourself or others, and suicide  . Tiredness that interferes with your daily activities  . Feeling that your heart is beating in a fast, slow or not normal way  . Feeling dizzy or lightheaded  . Chest pain or symptoms of a heart attack. Most heart attacks involve pain in the center of the chest that lasts more than a few minutes. The pain may go away and come back, or it can be constant. It can feel like pressure, squeezing, fullness, or pain. Sometimes pain is felt in one or both arms, the back, neck, jaw, or stomach. If any of these symptoms last 2 minutes, call 911.  Marland Kitchen Heartburn or indigestion  . Nausea that stops you from eating or drinking and/or is not relieved by prescribed medicines  . Throwing up   . Pain in your abdomen that does not go away  . Abnormal blood sugar  . Unusual thirst, passing urine often, headache, sweating, shakiness, irritability  . Swelling of legs, ankles, or feet  . Weight gain of 5 pounds in one week (fluid retention)  . Signs of possible liver problems: dark urine, pale bowel movements, bad stomach pain, feeling very tired and weak, unusual itching, or yellowing of the eyes or skin  . Severe muscle weakness  . A new rash or a rash that is not relieved by prescribed medicines  . Signs of allergic reaction: swelling of the face, feeling like your tongue or throat are swelling, trouble breathing, rash, itching, fever, chills, feeling dizzy, and/or feeling that your heart is beating in a fast or not normal way. If this happens, call 911 for emergency care.  . If you think you may be pregnant  Reproduction Warnings  . Pregnancy warning: It is not known if this drug may harm an unborn child. For this reason, be sure to talk with your doctor if you are pregnant or planning to become pregnant while receiving this drug. Let your doctor know right away if you think you may be pregnant or may have  impregnated your partner.  . Breastfeeding warning: It is not known if this drug passes into breast milk. For this reason, women should talk to their doctor about the risks and benefits of breastfeeding during treatment with this drug because this drug may enter the breast milk and cause harm to a breastfeeding baby.  . Fertility warning: Human fertility studies have not been done with this drug. Talk with your doctor or nurse if you plan to have children. Ask for information on sperm banking.  Pomalidomide (Pomalyst)  About This Drug Pomalidomide is used to treat cancer. It is given orally (by mouth).  Possible Side Effects  . A decrease in the number of white blood cells. This may raise your risk of infection.  . A decrease in the number of red blood cells. This may make you tired and weak.  . Nausea  . Diarrhea (  loose bowel movements)  . Constipation (unable to move bowels)  . Fever  . Tiredness and weakness  . Back pain  . Upper respiratory infection  . Trouble breathing  Note: Each of the side effects above was reported in 30% or greater of patients treated with pomalidomide. Not all possible side effects are included above.  Warnings and Precautions  . Blood clots and events such as stroke and heart attack. A blood clot in your leg may cause your leg to swell, appear red and warm, and/or cause pain. A blood clot in your lungs may cause trouble breathing, pain when breathing, and/or chest pain  . Severe bone marrow suppression.  . Changes in your liver function, which can cause liver failure and be life-threatening  . Allergic reactions, including anaphylaxis are rare but may happen in some patients. Signs of allergic reaction to this drug may be swelling of the face, feeling like your tongue or throat are swelling, trouble breathing, rash, itching, fever, chills, feeling dizzy, and/or feeling that your heart is beating in a fast or not normal way. If this happens, do  not take another dose of this drug. You should get urgent medical treatment.   . Severe allergic skin reaction. You may develop blisters on your skin that are filled with fluid or a severe red rash all over your body that may be painful.  . Confusion and/or feeling dizzy, which may impair your ability to drive or use machinery. Use caution and tell your nurse or doctor if you feel dizzy or confused.  . Effects on the nerves are called peripheral neuropathy. You may feel numbness, tingling, or pain in your hands and feet. It may be hard for you to button your clothes, open jars, or walk as usual. The effect on the nerves may get worse with more doses of the drug. These effects get better in some people after the drug is stopped but it does not get better in all people.  . Tumor lysis syndrome: This drug may act on the cancer cells very quickly. This may affect how your kidneys work and can be life-threatening.  . This drug may raise your risk of getting a second cancer.  Note: Some of the side effects above are very rare. If you have concerns and/or questions, please discuss them with your medical team.  Important Information  . You will need to sign up for a special program called Pomalyst REMS when you start taking this drug. Your nurse will help you get started.   . Do not donate blood during your treatment and for 4 weeks after your treatment  . Men should not donate sperm during your treatment because this drug is present in semen and may cause harm to a baby.  . Avoid smoking while taking pomalidomide as this may lower the levels of the drug in your body, which can make it less effective.  How to Take Your Medication  . Swallow the medicine whole with water, with or without food. Do not chew, break, or open it.  . Take this medicine as directed at the same time each day.  . Missed dose: If you miss a dose, take it as soon as you think about it ONLY if it has been less than 12  hours since your regular time. If it has been more than 12 hours, skip the missed dose and contact your doctor. Take your next dose at the regular time. Do not take 2 doses at  the same time and do not double up on the next dose.  . If you vomit a dose, take your next dose at the regular time.  . Handling: Wash your hands after handling your medicine; your caretakers should not handle your medicine with bare hands and should wear latex gloves.  If you get any of the content of a broken capsules on your skin or inside of your mouth, you should wash the area of the skin well with soap and water right away. Flush your mouth with flowing water. Call your doctor if you get a skin reaction.  . This drug may be present in the saliva, tears, sweat, urine, stool, vomit, semen, and vaginal secretions. Talk to your doctor and/or your nurse about the necessary precautions to take during this time.  . Storage: Store this medicine in the original container at room temperature.  . Disposal of unused medicine: Do not flush any expired and/or unused medicine down the toilet or drain unless you are specifically instructed to do so on the medication label. Some facilities have take-back programs and/or other options. If you do not have a take-back program in your area, then please discuss with your nurse or your doctor how to dispose of unused medicine.  Treating Side Effects  . Manage tiredness by pacing your activities for the day.  . Be sure to include periods of rest between energy-draining activities.  . To decrease the risk of infection, wash your hands regularly.  . Avoid close contact with people who have a cold, the flu, or other infections.  . Take your temperature as your doctor or nurse tells you, and whenever you feel like you may have a fever.  . To help decrease bleeding, use a soft toothbrush. Check with your nurse before using dental floss.  . Be very careful when using knives or tools.  .  Use an electric shaver instead of a razor.  . Drink plenty of fluids (a minimum of eight glasses per day is recommended).  . If you throw up or have loose bowel movements, you should drink more fluids so that you do not become dehydrated (lack of water in the body from losing too much fluid).  . If you have diarrhea, eat low-fiber foods that are high in protein and calories and avoid foods that can irritate your digestive tracts or lead to cramping.  . Ask your nurse or doctor about medicine that can lessen or stop your diarrhea and/or constipation  . To help with nausea and vomiting, eat small, frequent meals instead of three large meals a day. Choose foods and drinks that are at room temperature. Ask your nurse or doctor about other helpful tips and medicine that is available to help stop or lessen these symptoms.  . If you are not able to move your bowels, check with your doctor or nurse before you use enemas, laxatives, or suppositories.  Marland Kitchen Keeping your pain under control is important to your well-being. Please tell your doctor or nurse if you are experiencing pain.  . If you have numbness and tingling in your hands and feet, be careful when cooking, walking, and handling sharp objects and hot liquids.  . If you get a rash do not put anything on it unless your doctor or nurse says you may. Keep the area around the rash clean and dry. Ask your doctor for medicine if your rash bothers you.  Food and Drug Interactions  . There are no known interactions  of pomalidomide with food.  . Check with your doctor or pharmacist about all other prescription medicines and over-the-counter medicines and dietary supplements (vitamins, minerals, herbs and others) you are taking before starting this medicine as there are known drug interactions with pomalidomide. Also, check with your doctor or pharmacist before starting any new prescription or over-the-counter medicines, or dietary supplements to make sure  that there are no interactions.  When to Call the Doctor Call your doctor or nurse if you have any of these symptoms and/or any new or unusual symptoms:  . Fever of 100.4 F (38 C) or higher  . Chills  . Tiredness that interferes with your daily activities  . Feeling dizzy or lightheaded  . Easy bleeding or bruising  . Your leg or arm is swollen, red, warm and/or painful  . Chest pain or symptoms of a heart attack. Most heart attacks involve pain in the center of the chest that lasts more than a few minutes. The pain may go away and come back. It can feel like pressure, squeezing, fullness, or pain. Sometimes pain is felt in one or both arms, the back, neck, jaw, or stomach. If any of these symptoms last 2 minutes, call 911.  Marland Kitchen Symptoms of a stroke such as sudden numbness or weakness of your face, arm, or leg, mostly on one side of your body; sudden confusion, trouble speaking or understanding; sudden trouble seeing in one or both eyes; sudden trouble walking, feeling dizzy, loss of balance or coordination; or sudden, bad headache with no known cause. If you have any of these symptoms for 2 minutes, call 911.  . Wheezing or trouble breathing  . Coughing up yellow, green, or bloody mucus  . Nausea that stops you from eating or drinking and/or is not relieved by prescribed medicines  . Diarrhea, 4 times in one day or diarrhea with lack of strength or a feeling of being dizzy  . No bowel movement in 3 days or when you feel uncomfortable  . Lasting loss of appetite or rapid weight loss of five pounds in a week  . Pain that does not go away, or is not relieved by prescribed medicines . Signs of possible liver problems: dark urine, pale bowel movements, bad stomach pain, feeling very tired and weak, unusual itching, or yellowing of the eyes or skin . Signs of tumor lysis: Confusion or agitation, decreased urine, nausea/vomiting, diarrhea, muscle cramping, numbness and/or tingling,  seizures.  . Signs of allergic reaction: swelling of the face, feeling like your tongue or throat are swelling, trouble breathing, rash, itching, fever, chills, feeling dizzy, and/or feeling that your heart is beating in a fast or not normal way. If this happens, call 911 for emergency care.  . Numbness, tingling, or pain in your hands and feet  . Flu-like symptoms: fever, headache, muscle and joint aches, and fatigue (low energy, feeling weak)  . A new rash or a rash that is not relieved by prescribed medicines  . If you think you may be pregnant or may have impregnated your partner  Reproduction Warnings  . Pregnancy warning: This drug can have harmful effects on the unborn baby. Women of child bearing potential should use 2 effective methods of birth control, one of which must be a highly effective method of birth control, beginning 4 weeks before treatment starts, during your cancer treatment, including dose interruptions, and for at least 4 weeks after treatment. A highly effective method of birth control includes tubal ligation,  intra-uterine device (IUD), hormonal (birth control)  SELF CARE ACTIVITIES WHILE RECEIVING CHEMOTHERAPY:  Hydration Increase your fluid intake 48 hours prior to treatment and drink at least 8 to 12 cups (64 ounces) of water/decaffeinated beverages per day after treatment. You can still have your cup of coffee or soda but these beverages do not count as part of your 8 to 12 cups that you need to drink daily. No alcohol intake.  Medications Continue taking your normal prescription medication as prescribed.  If you start any new herbal or new supplements please let us know first to make sure it is safe.  Mouth Care Have teeth cleaned professionally before starting treatment. Keep dentures and partial plates clean. Use soft toothbrush and do not use mouthwashes that contain alcohol. Biotene is a good mouthwash that is available at most pharmacies or may be ordered  by calling [phone]. Use warm salt water gargles (1 teaspoon salt per 1 quart warm water) before and after meals and at bedtime. If you need dental work, please let the doctor know before you go for your appointment so that we can coordinate the best possible time for you in regards to your chemo regimen. You need to also let your dentist know that you are actively taking chemo. We may need to do labs prior to your dental appointment.  Skin Care Always use sunscreen that has not expired and with SPF (Sun Protection Factor) of 50 or higher. Wear hats to protect your head from the sun. Remember to use sunscreen on your hands, ears, face, & feet.  Use good moisturizing lotions such as udder cream, eucerin, or even Vaseline. Some chemotherapies can cause dry skin, color changes in your skin and nails.    . Avoid long, hot showers or baths. . Use gentle, fragrance-free soaps and laundry detergent. . Use moisturizers, preferably creams or ointments rather than lotions because the thicker consistency is better at preventing skin dehydration. Apply the cream or ointment within 15 minutes of showering. Reapply moisturizer at night, and moisturize your hands every time after you wash them.  Hair Loss (if your doctor says your hair will fall out)  . If your doctor says that your hair is likely to fall out, decide before you begin chemo whether you want to wear a wig. You may want to shop before treatment to match your hair color. . Hats, turbans, and scarves can also camouflage hair loss, although some people prefer to leave their heads uncovered. If you go bare-headed outdoors, be sure to use sunscreen on your scalp. . Cut your hair short. It eases the inconvenience of shedding lots of hair, but it also can reduce the emotional impact of watching your hair fall out. . Don't perm or color your hair during chemotherapy. Those chemical treatments are already damaging to hair and can enhance hair loss. Once  your chemo treatments are done and your hair has grown back, it's OK to resume dyeing or perming hair.  With chemotherapy, hair loss is almost always temporary. But when it grows back, it may be a different color or texture. In older adults who still had hair color before chemotherapy, the new growth may be completely gray.  Often, new hair is very fine and soft.  Infection Prevention Please wash your hands for at least 30 seconds using warm soapy water. Handwashing is the #1 way to prevent the spread of germs. Stay away from sick people or people who are getting over a cold. If  you develop respiratory systems such as green/yellow mucus production or productive cough or persistent cough let us know and we will see if you need an antibiotic. It is a good idea to keep a pair of gloves on when going into grocery stores/[employer] to decrease your risk of coming into contact with germs on the carts, etc. Carry alcohol hand gel with you at all times and use it frequently if out in public. If your temperature reaches 100.5 or higher please call the clinic and let us know.  If it is after hours or on the weekend please go to the ER if your temperature is over 100.5.  Please have your own personal thermometer at home to use.    Sex and bodily fluids If you are going to have sex, a condom must be used to protect the person that isn't taking chemotherapy. Chemo can decrease your libido (sex drive). For a few days after chemotherapy, chemotherapy can be excreted through your bodily fluids.  When using the toilet please close the lid and flush the toilet twice.  Do this for a few day after you have had chemotherapy.   Effects of chemotherapy on your sex life Some changes are simple and won't last long. They won't affect your sex life permanently.  Sometimes you may feel: . too tired . not strong enough to be very active . sick or sore  . not in the mood . anxious or low  Your anxiety might not seem related to  sex. For example, you may be worried about the cancer and how your treatment is going. Or you may be worried about money, or about how you family are coping with your illness.  These things can cause stress, which can affect your interest in sex. It's important to talk to your partner about how you feel.  Remember - the changes to your sex life don't usually last long. There's usually no medical reason to stop having sex during chemo. The drugs won't have any long term physical effects on your performance or enjoyment of sex. Cancer can't be passed on to your partner during sex  Contraception It's important to use reliable contraception during treatment. Avoid getting pregnant while you or your partner are having chemotherapy. This is because the drugs may harm the baby. Sometimes chemotherapy drugs can leave a man or woman infertile.  This means you would not be able to have children in the future. You might want to talk to someone about permanent infertility. It can be very difficult to learn that you may no longer be able to have children. Some people find counselling helpful. There might be ways to preserve your fertility, although this is easier for men than for women. You may want to speak to a fertility expert. You can talk about sperm banking or harvesting your eggs. You can also ask about other fertility options, such as donor eggs. If you have or have had breast cancer, your doctor might advise you not to take the contraceptive pill. This is because the hormones in it might affect the cancer. It is not known for sure whether or not chemotherapy drugs can be passed on through semen or secretions from the vagina. Because of this some doctors advise people to use a barrier method if you have sex during treatment. This applies to vaginal, anal or oral sex. Generally, doctors advise a barrier method only for the time you are actually having the treatment and for about a week after  your treatment. Advice like  this can be worrying, but this does not mean that you have to avoid being intimate with your partner. You can still have close contact with your partner and continue to enjoy sex.  Animals If you have cats or birds we just ask that you not change the litter or change the cage.  Please have someone else do this for you while you are on chemotherapy.   Food Safety During and After Cancer Treatment Food safety is important for people both during and after cancer treatment. Cancer and cancer treatments, such as chemotherapy, radiation therapy, and stem cell/bone marrow transplantation, often weaken the immune system. This makes it harder for your body to protect itself from foodborne illness, also called food poisoning. Foodborne illness is caused by eating food that contains harmful bacteria, parasites, or viruses.  Foods to avoid Some foods have a higher risk of becoming tainted with bacteria. These include: Marland Kitchen Unwashed fresh fruit and vegetables, especially leafy vegetables that can hide dirt and other contaminants . Raw sprouts, such as alfalfa sprouts . Raw or undercooked beef, especially ground beef, or other raw or undercooked meat and poultry . Fatty, fried, or spicy foods immediately before or after treatment.  These can sit heavy on your stomach and make you feel nauseous. . Raw or undercooked shellfish, such as oysters. . Sushi and sashimi, which often contain raw fish.  . Unpasteurized beverages, such as unpasteurized fruit juices, raw milk, raw yogurt, or cider . Undercooked eggs, such as soft boiled, over easy, and poached; raw, unpasteurized eggs; or foods made with raw egg, such as homemade raw cookie dough and homemade mayonnaise  Simple steps for food safety  Shop smart. . Do not buy food stored or displayed in an unclean area. . Do not buy bruised or damaged fruits or vegetables. . Do not buy cans that have cracks, dents, or bulges. . Pick up foods that can spoil at the end of  your shopping trip and store them in a cooler on the way home.  Prepare and clean up foods carefully. . Rinse all fresh fruits and vegetables under running water, and dry them with a clean towel or paper towel. . Clean the top of cans before opening them. . After preparing food, wash your hands for 20 seconds with hot water and soap. Pay special attention to areas between fingers and under nails. . Clean your utensils and dishes with hot water and soap. Marland Kitchen Disinfect your kitchen and cutting boards using 1 teaspoon of liquid, unscented bleach mixed into 1 quart of water.    Dispose of old food. . Eat canned and packaged food before its expiration date (the "use by" or "best before" date). . Consume refrigerated leftovers within 3 to 4 days. After that time, throw out the food. Even if the food does not smell or look spoiled, it still may be unsafe. Some bacteria, such as Listeria, can grow even on foods stored in the refrigerator if they are kept for too long.  Take precautions when eating out. . At restaurants, avoid buffets and salad bars where food sits out for a long time and comes in contact with many people. Food can become contaminated when someone with a virus, often a norovirus, or another "bug" handles it. . Put any leftover food in a "to-go" container yourself, rather than having the server do it. And, refrigerate leftovers as soon as you get home. . Choose restaurants that are clean and that  are willing to prepare your food as you order it cooked.   AT HOME MEDICATIONS:                                                                                                                                                                Compazine/Prochlorperazine 74m tablet. Take 1 tablet every 6 hours as needed for nausea/vomiting (This can make you sleepy).   EMLA cream. Apply a quarter size amount to port site 1 hour prior to chemo. Do not rub in. Cover with plastic wrap.   Diarrhea  Sheet   If you are having loose stools/diarrhea, please purchase Imodium and begin taking as outlined:  At the first sign of poorly formed or loose stools you should begin taking Imodium (loperamide) 2 mg capsules.  Take two tablets (433m followed by one tablet (49m59mevery 2 hours - DO NOT EXCEED 8 tablets in 24 hours.  If it is bedtime and you are having loose stools, take 2 tablets at bedtime, then 2 tablets every 4 hours until morning.   Always call the [facility] you are having loose stools/diarrhea that you can't get under control.  Loose stools/diarrhea leads to dehydration (loss of water) in your body.  We have other options of trying to get the loose stools/diarrhea to stop but you must let us [place]!   Constipation Sheet  Colace - 100 mg capsules - take 2 capsules daily.  If this doesn't help then you can increase to 2 capsules twice daily.  Please call if the above does not work for you. Do not go more than 2 days without a bowel movement.  It is very important that you do not become constipated.  It will make you feel sick to your stomach (nausea) and can cause abdominal pain and vomiting.  Nausea Sheet   Compazine/Prochlorperazine 30m38mblet. Take 1 tablet every 6 hours as needed for nausea/vomiting (This can make you drowsy).  If you are having persistent nausea (nausea that does not stop) please call the [facility] let us [place] the amount of nausea that you are experiencing.  If you begin to vomit, you need to call the [facility] if it is the weekend and you have vomited more than one time and can't get it to stop-go to the Emergency Room.  Persistent nausea/vomiting can lead to dehydration (loss of fluid in your body) and will make you feel very weak and unwell. Ice chips, sips of clear liquids, foods that are at room temperature, crackers, and toast tend to be better tolerated.   SYMPTOMS TO REPORT AS SOON AS POSSIBLE AFTER TREATMENT:  FEVER GREATER THAN 100.5  F  CHILLS WITH OR WITHOUT FEVER  NAUSEA AND VOMITING THAT IS NOT CONTROLLED WITH  YOUR NAUSEA MEDICATION  UNUSUAL SHORTNESS OF BREATH  UNUSUAL BRUISING OR BLEEDING  TENDERNESS IN MOUTH AND THROAT WITH OR WITHOUT   PRESENCE OF ULCERS  URINARY PROBLEMS  BOWEL PROBLEMS  UNUSUAL RASH    Wear comfortable clothing and clothing appropriate for easy access to any Portacath or PICC line. Let us know if there is anything that we can do to make your therapy better!   What to do if you need assistance after hours or on the weekends: CALL [phone].  HOLD on the line, do not hang up.  You will hear multiple messages but at the end you will be connected with a nurse triage line.  They will contact the doctor if necessary.  Most of the time they will be able to assist you.  Do not call the hospital operator.     I have been informed and understand all of the instructions given to me and have received a copy. I have been instructed to call the clinic [phone] or my family physician as soon as possible for continued medical care, if indicated. I do not have any more questions at this time but understand that I may call the [facility] or the Patient Navigator at [phone] during office hours should I have questions or need assistance in obtaining follow-up care.

## 2019-02-28 ENCOUNTER — Other Ambulatory Visit: Payer: Self-pay

## 2019-02-28 LAB — SURGICAL PATHOLOGY

## 2019-03-04 ENCOUNTER — Inpatient Hospital Stay (HOSPITAL_COMMUNITY): Payer: Medicare Other

## 2019-03-04 ENCOUNTER — Inpatient Hospital Stay (HOSPITAL_COMMUNITY): Payer: Medicare Other | Admitting: General Practice

## 2019-03-04 ENCOUNTER — Encounter (HOSPITAL_COMMUNITY): Payer: Self-pay | Admitting: General Practice

## 2019-03-04 DIAGNOSIS — C9000 Multiple myeloma not having achieved remission: Secondary | ICD-10-CM

## 2019-03-04 NOTE — Progress Notes (Signed)
[facility] Initial Psychosocial Assessment Clinical Social Work  Clinical Social Work contacted by phone to assess psychosocial, emotional, mental health, and spiritual needs of the patient.   Barriers to care/review of distress screen:  - Transportation:  Do you anticipate any problems getting to appointments?  Do you have someone who can help run errands for you if you need it?  Drives herself.  Could ask nieces, nephews, sisters to drive if needed.   - Help at home:  What is your living situation (alone, family, other)?  If you are physically unable to care for yourself, who would you call on to help you?  [place] on outskirts of [place].  Has neighbors on both side.  Has brother "down the road" and daughter "in [place]."  Has lived there 36 years or more.  - Support system:  What does your support system look like?  Who would you call on if you needed some kind of practical help?  What if you needed someone to talk to for emotional support?  Sometimes has family members who will come to appointments with her.  Has daughter who works in [employer]. Has sister who is a Company secretary - she is good for emotional and spiritual support.  Has multiple family members who are available.  Is from a family of 8 siblings - "whatever I need, all I have to do is call."   - Finances:  Are you concerned about finances  Considering returning to work?  If not, applying for disability?  "Gets monthly SSI check."  Is "making it", has no food insecurity.  Her Medicare plan gives her voucher for fresh produce.    What is your understanding of where you are with your cancer? Its cause?  Your treatment plan and what happens next?  Cancer started 3 years ago - went into remission, then "it came back."  Was unaware that cancer was coming back until a regular screening had numbers.  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  Was sent back to [facility] (Dr Norma Fredrickson),  wanted to start her on chemo.  Wants to get chemo at [facility].  Aware it will be a "different chemo" that won't "take my hair off."  Plan is to do chemo "until the numbers get better" - will return to oncologist in [facility] in March for check in.    What are your worries for the future as you begin treatment for cancer?  "I worry that I might be sick, but it doesn't bother me."  Wants to get COVID shot, is aware that she is not in the right age category yet.  Keeps her grandson two days/week.  Keeps another grandson (2 in Feb).  Does not go out to grocery store, attends church in car.  Tries to limit exposure.   What are your hopes and priorities during your treatment? What is important to you? What are your goals for your care?  "That it will cure me, I know this wont just go away, its a process but I want to live."  Willing to fight to live.  Gets around fine at home, but has "bad knees."  Anticipates knee replacement after she loses weight.  Has become significantly short of breath - hopes that chemo will help reduce this symptom "it did last time."   Is on inhaler at home, not on home oxygen.  "Taking care of my grandchildren  is my exercise."    What are you willing to sacrifice during your treatment?  What are you NOT willing to sacrifice during your treatment?  Wants to keep on taking care of her grandchildren - has cared for them since birth.     CSW Summary:  Patient and family psychosocial functioning including strengths, limitations, and coping skills:  Patient is a 64 year old female, recently diagnosed with recurrance of multiple myeloma, found as result of routine surveillance labs.  Is 3 years post stem cell transplant at [facility].  Continues to see oncologist there but wants to have her chemotherapy at [facility].  Lives alone but has robust support system from family who live locally.  Keeps young grandchildren and is very invested in being able to continue this.  Does struggle w being SOB as result of  disease - this can impact 'running after" her young grandchildren.  Overall, she has a positive attitude about this recurrance, the options for continuing treatment to address symptoms.  Hopeful that she will return to prior state of health after chemo.  Does have "bad knees" and hopes to have knee replacement at some future point.    Identifications of barriers to care:  None noted other than need for gas card.  Availability of community resources:  Can send information on [employer] and [employer].    Clinical Social Worker follow up needed: No.  Please reconsult if needs arise.  Edwyna Shell, LCSW Clinical Social Worker Phone:  [phone] Cell:  [phone]

## 2019-03-05 ENCOUNTER — Inpatient Hospital Stay (HOSPITAL_BASED_OUTPATIENT_CLINIC_OR_DEPARTMENT_OTHER): Payer: Medicare Other | Admitting: Hematology

## 2019-03-05 ENCOUNTER — Other Ambulatory Visit: Payer: Self-pay

## 2019-03-05 ENCOUNTER — Inpatient Hospital Stay (HOSPITAL_COMMUNITY): Payer: Medicare Other

## 2019-03-05 ENCOUNTER — Encounter (HOSPITAL_COMMUNITY): Payer: Self-pay | Admitting: Hematology

## 2019-03-05 VITALS — BP 131/75 | HR 80 | Temp 96.9°F | Resp 20 | Wt 340.8 lb

## 2019-03-05 DIAGNOSIS — C9000 Multiple myeloma not having achieved remission: Secondary | ICD-10-CM

## 2019-03-05 LAB — CBC WITH DIFFERENTIAL/PLATELET
Abs Immature Granulocytes: 0.01 10*3/uL (ref 0.00–0.07)
Basophils Absolute: 0 10*3/uL (ref 0.0–0.1)
Basophils Relative: 0 %
Eosinophils Absolute: 0.1 10*3/uL (ref 0.0–0.5)
Eosinophils Relative: 2 %
HCT: 33.8 % — ABNORMAL LOW (ref 36.0–46.0)
Hemoglobin: 10.9 g/dL — ABNORMAL LOW (ref 12.0–15.0)
Immature Granulocytes: 0 %
Lymphocytes Relative: 27 %
Lymphs Abs: 1.3 10*3/uL (ref 0.7–4.0)
MCH: 30.5 pg (ref 26.0–34.0)
MCHC: 32.2 g/dL (ref 30.0–36.0)
MCV: 94.7 fL (ref 80.0–100.0)
Monocytes Absolute: 0.5 10*3/uL (ref 0.1–1.0)
Monocytes Relative: 11 %
Neutro Abs: 2.8 10*3/uL (ref 1.7–7.7)
Neutrophils Relative %: 60 %
Platelets: 132 10*3/uL — ABNORMAL LOW (ref 150–400)
RBC: 3.57 MIL/uL — ABNORMAL LOW (ref 3.87–5.11)
RDW: 14.2 % (ref 11.5–15.5)
WBC: 4.7 10*3/uL (ref 4.0–10.5)
nRBC: 0 % (ref 0.0–0.2)

## 2019-03-05 LAB — APTT: aPTT: <20 seconds — ABNORMAL LOW (ref 24–36)

## 2019-03-05 LAB — COMPREHENSIVE METABOLIC PANEL
ALT: 16 U/L (ref 0–44)
AST: 18 U/L (ref 15–41)
Albumin: 4.1 g/dL (ref 3.5–5.0)
Alkaline Phosphatase: 51 U/L (ref 38–126)
Anion gap: 9 (ref 5–15)
BUN: 22 mg/dL (ref 8–23)
CO2: 28 mmol/L (ref 22–32)
Calcium: 9.6 mg/dL (ref 8.9–10.3)
Chloride: 101 mmol/L (ref 98–111)
Creatinine, Ser: 1.03 mg/dL — ABNORMAL HIGH (ref 0.44–1.00)
GFR calc Af Amer: >60 mL/min (ref >60)
GFR calc non Af Amer: 58 mL/min — ABNORMAL LOW (ref >60)
Glucose, Bld: 101 mg/dL — ABNORMAL HIGH (ref 70–99)
Potassium: 3.8 mmol/L (ref 3.5–5.1)
Sodium: 138 mmol/L (ref 135–145)
Total Bilirubin: 0.5 mg/dL (ref 0.3–1.2)
Total Protein: 7.9 g/dL (ref 6.5–8.1)

## 2019-03-05 LAB — BRAIN NATRIURETIC PEPTIDE: B Natriuretic Peptide: 56 pg/mL (ref 0.0–100.0)

## 2019-03-05 LAB — PROTIME-INR
INR: 1 (ref 0.8–1.2)
Prothrombin Time: 12.8 seconds (ref 11.4–15.2)

## 2019-03-05 LAB — URIC ACID: Uric Acid, Serum: 5.8 mg/dL (ref 2.5–7.1)

## 2019-03-05 LAB — TROPONIN I (HIGH SENSITIVITY): Troponin I (High Sensitivity): <2 ng/L (ref <18)

## 2019-03-05 MED ORDER — DEXTROSE 5 % IV SOLN
18.0000 mg/m2 | Freq: Once | INTRAVENOUS | Status: AC
  Administered 2019-03-05: 40 mg via INTRAVENOUS
  Filled 2019-03-05: qty 5

## 2019-03-05 MED ORDER — SODIUM CHLORIDE 0.9% FLUSH
10.0000 mL | INTRAVENOUS | Status: DC | PRN
  Administered 2019-03-05 (×2): 10 mL

## 2019-03-05 MED ORDER — SODIUM CHLORIDE 0.9 % IV SOLN: Freq: Once | INTRAVENOUS | Status: AC

## 2019-03-05 MED ORDER — HEPARIN SOD (PORK) LOCK FLUSH 100 UNIT/ML IV SOLN
500.0000 [IU] | Freq: Once | INTRAVENOUS | Status: AC | PRN
  Administered 2019-03-05: 500 [IU]

## 2019-03-05 MED ORDER — SODIUM CHLORIDE 0.9 % IV SOLN
40.0000 mg | Freq: Once | INTRAVENOUS | Status: AC
  Administered 2019-03-05: 40 mg via INTRAVENOUS
  Filled 2019-03-05: qty 4

## 2019-03-05 MED ORDER — SODIUM CHLORIDE 0.9 % IV SOLN: INTRAVENOUS | Status: DC

## 2019-03-05 NOTE — Progress Notes (Signed)
Patient has been assessed, vital signs and labs have been reviewed by Dr. Katragadda. ANC, Creatinine, LFTs, and Platelets are within treatment parameters per Dr. Katragadda. The patient is good to proceed with treatment at this time.  

## 2019-03-05 NOTE — Assessment & Plan Note (Signed)
1. 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 -Maintenance Ninlaro 3 weeks on 1 week off from March 2018 through December 2020 with progression. -She was last seen by me on 10/21/2018 when her M spike was 0.7 g. Subsequently had M spike has gone up. -Restaging PET scan on 02/10/2019 was negative for any osseous or extraosseous lesions. -Bone marrow biopsy on 02/14/2019 shows 10 to 15% plasma cells. Chromosome analysis was normal. FISH panel shows del 17 P, deletion 13 q., duplication 1 q. -Labs on 01/27/2019 shows M spike of 1 g/dL. Free light chain ratio is 2.69. LDH is 190. Hemoglobin is 11.2. Creatinine is 1.16. Calcium is 9.9. Albumin is 3.9. Kappa light chains are 42 and lambda light chains are 15.6. -She has not received her pomalidomide yet.  We have sent it to [facility]. -We talked about starting her on carfilzomib today.  We have reviewed her labs.  Platelet count mildly decreased. -We discussed the various side effects of carfilzomib including fluid overload.  Will start at low-dose of 20 mg per metered squared and increase it next week.  She has received a 40 mg of dexamethasone as part of premeds. -She will start pomalidomide as soon as she receives it.  I will reevaluate her in 1 week for follow-up to see how she is tolerating carfilzomib.  2. Shortness of breath on exertion: -She reports worsening shortness of breath on exertion in the last 3 months. -2D echocardiogram on 03/09/2019 shows EF 60-65%. No left ventricular hypertrophy. Left ventricular internal cavity size was small. -CT chest PE protocol on 02/24/2019 shows no large pulmonary embolism. Stable 8 mm perifissural nodule in the right lung consistent with benign etiology. -Her oxygen saturation on exertion dropped to 87% in our office.  We have ordered oxygen but has been denied by her insurance. -I have ordered a MRI  of the heart for evaluation of amyloidosis.  We have also sent some blood work including troponin T and proBNP.  3. ID prophylaxis: -She started taking acyclovir 400 mg twice daily.  She will also continue 81 mg aspirin.  4. Neuropathy: -Numbness in the right hand fingertips and on and off in the right foot. She wears a glove out to the right hand. She reports burning type pain when she takes of the globe. -She is continuing gabapentin 300 mg twice daily.

## 2019-03-05 NOTE — Progress Notes (Signed)
0950:  Chemotherapy education packet given and discussed with pt and family in detail.  Discussed diagnosis and staging, tx regimen, and intent of tx.  Reviewed chemotherapy medications and side effects, as well as pre-medications.  Instructed on how to manage side effects at home, and when to call the clinic.  Importance of fever/chills discussed with pt and family. Discussed precautions to implement at home after receiving tx, as well as self care strategies. Phone numbers provided for clinic during regular working hours, also how to reach the clinic after hours and on weekends. Pt and family provided the opportunity to ask questions - all questions answered to pt's and family satisfaction. Patient was also provided a packet of additional information from the manufacturer of Pomalyst, she was asked to review this at her leisure.

## 2019-03-05 NOTE — Progress Notes (Signed)
[facility] [place], [place] [address]   CLINIC:  Medical Oncology/Hematology  PCP:  Abran Richard, MD [address] [place] [address] [phone]   REASON FOR VISIT:  Follow-up for multiple myeloma   BRIEF ONCOLOGIC HISTORY:  Oncology History  Multiple myeloma ([name])  08/07/2014 Imaging   Bone Survey- No lytic lesions are noted in the visualized skeleton.   09/03/2014 Bone Marrow Biopsy   NORMOCELLULAR BONE MARROW FOR AGE WITH PLASMA CELL NEOPLASM.  The plasma cell component is increased in the marrow representing an estimated 18% of all cells. Cytogenetics with 13q-, 17p- (high risk disease)   09/03/2014 Pathology Results   Cytogenetics with 13q-, 17p- (high risk disease)   09/23/2014 Initial Diagnosis   Multiple myeloma   09/30/2014 PET scan   No abnormal hypermetabolism in the neck, chest, abdomen or pelvis.   10/05/2014 - 03/22/2015 Chemotherapy   RVD   10/28/2014 Treatment Plan Change   Issues related to getting Revlimid in a timely fashion, therefore, she received her Revlimid on 9/21 resulting in a 12 day cycle this time instead of a 14 day cycle   11/02/2014 Imaging   CTA chest- No evidence for a large or central pulmonary embolism as described.  8 mm density along the right minor fissure could represent focal pleural thickening but indeterminate. If the patient is at high risk for bronchogenic carcinoma, follow-up c   11/23/2014 Miscellaneous   Zometa 4 mg IV monthly   04/07/2015 Bone Marrow Biopsy   Normocellular marrow with 2-4% clonal plasma cells by immunohistochemistry. FISH and cytogenetics were normal [facility])     04/2015 Miscellaneous   PRETRANSPLANT EVALUATION:  Pulmonary function tests: FEV1 100.3% / DLCO 97.9%  Echocardiogram: Normal LV function with EF 60-65%    04/27/2015 Procedure   Stem cell mobilization with filgrastim and Mozobil [facility])   05/06/2015 Miscellaneous   BMT conditioning regimen with high-dose  Melphalan given [facility] [name]); Day -1   05/07/2015 Bone Marrow Transplant   Outpatient autologous stem cell transplant [facility] [name]); Day 0   05/18/2015 Miscellaneous   WBC engraftment;  did not require platelet transfusion during her transplant process. Lowest platelet count 28,000    05/20/2015 Procedure   Tunneled catheter removed [facility])   09/08/2015 -  Chemotherapy   Velcade every 2 weeks   12/15/2015 Miscellaneous   Zometa re-instituted.    12/23/2015 Imaging   Bone density- BMD as determined from Forearm Radius 33% is 0.799 g/cm2 with a T-Score of 1.2. This patient is considered normal according to [facility]) criteria.   04/17/2016 Miscellaneous   Started maintenance with Ninlaro.   05/04/2016 Treatment Plan Change   Zometa switched to Xgeva injection monthly given difficult IV access.    03/05/2019 -  Chemotherapy   The patient had carfilzomib (KYPROLIS) 40 mg in dextrose 5 % 50 mL chemo infusion, 18 mg/m2 = 44 mg, Intravenous,  Once, 1 of 4 cycles Dose modification: 56 mg/m2 (original dose 56 mg/m2, Cycle 1, Reason: Provider Judgment) Administration: 40 mg (03/05/2019)  for chemotherapy treatment.       CANCER STAGING: Cancer Staging Multiple myeloma [facility]) Staging form: Multiple Myeloma, AJCC 6th Edition - Clinical stage from 10/26/2014: Stage IIA - Signed by Baird Cancer, PA-C on 10/26/2014 - Pathologic: No stage assigned - Unsigned    INTERVAL HISTORY:  Kelli Hurley 64 y.o. female seen for follow-up of multiple myeloma.  She had recent progression on Ninlaro.  We have changed her regimen.  She has not received pomalidomide shipment yet.  Appetite is 100%.  Energy levels are 25%.  Pain in the knees and the right hand is reported as 8 out of 10.  Shortness of breath on exertion is stable.  Numbness in the right hand is also stable.  Denies any new onset pains.  REVIEW OF SYSTEMS:  Review of Systems  Respiratory:  Positive for shortness of breath.   Musculoskeletal: Positive for arthralgias.  Neurological: Positive for numbness.  All other systems reviewed and are negative.    PAST MEDICAL/SURGICAL HISTORY:  Past Medical History:  Diagnosis Date  . Anemia   . Arthritis   . Cancer ([place])   . Claustrophobia 10/05/2014  . Hypertension   . Leukopenia 08/03/2014  . Normocytic hypochromic anemia 08/03/2014  . Renal disorder    stage 3    Past Surgical History:  Procedure Laterality Date  . ABDOMINAL HYSTERECTOMY    . CARDIAC SURGERY    . OTHER SURGICAL HISTORY     heart surgery as infant to "repair hole in heart"  . PORTACATH PLACEMENT Left 02/21/2019   Procedure: INSERTION PORT-A-CATH;  Surgeon: Virl Cagey, MD;  Location: [facility];  Service: General;  Laterality: Left;     SOCIAL HISTORY:  Social History   Socioeconomic History  . Marital status: Legally Separated    Spouse name: Not on file  . Number of children: Not on file  . Years of education: Not on file  . Highest education level: Not on file  Occupational History  . Not on file  Tobacco Use  . Smoking status: Never Smoker  . Smokeless tobacco: Never Used  Substance and Sexual Activity  . Alcohol use: No  . Drug use: No  . Sexual activity: Not on file    Comment: divorced- 2 daughters  Other Topics Concern  . Not on file  Social History Narrative  . Not on file   Social Determinants of Health   Financial Resource Strain:   . Difficulty of Paying Living Expenses: Not on file  Food Insecurity:   . Worried About Charity fundraiser in the Last Year: Not on file  . Ran Out of Food in the Last Year: Not on file  Transportation Needs:   . Lack of Transportation (Medical): Not on file  . Lack of Transportation (Non-Medical): Not on file  Physical Activity:   . Days of Exercise per Week: Not on file  . Minutes of Exercise per Session: Not on file  Stress:   . Feeling of Stress : Not on file  Social Connections:    . Frequency of Communication with Friends and Family: Not on file  . Frequency of Social Gatherings with Friends and Family: Not on file  . Attends Religious Services: Not on file  . Active Member of Clubs or Organizations: Not on file  . Attends Archivist Meetings: Not on file  . Marital Status: Not on file  Intimate Partner Violence:   . Fear of Current or Ex-Partner: Not on file  . Emotionally Abused: Not on file  . Physically Abused: Not on file  . Sexually Abused: Not on file    FAMILY HISTORY:  Family History  Problem Relation Age of Onset  . Cancer Mother   . Hypertension Mother   . Cancer Father   . Hypertension Father   . Cancer Maternal Grandmother   . Hypertension Sister   . Hypertension Brother  CURRENT MEDICATIONS:  Outpatient Encounter Medications as of 03/05/2019  Medication Sig  . acyclovir (ZOVIRAX) 400 MG tablet Take 1 tablet (400 mg total) by mouth 2 (two) times daily.  . Ascorbic Acid (VITAMIN C) 1000 MG tablet Take 1,000 mg by mouth daily.   Marland Kitchen aspirin EC 81 MG tablet Take 81 mg by mouth daily.  Marland Kitchen CARFILZOMIB IV Inject into the vein.  . cholecalciferol (VITAMIN D) 1000 units tablet Take 1,000 Units by mouth daily.  Marland Kitchen gabapentin (NEURONTIN) 300 MG capsule Take 1 capsule (300 mg total) by mouth 3 (three) times daily. (Patient taking differently: Take 300 mg by mouth 2 (two) times daily. )  . hydrochlorothiazide (HYDRODIURIL) 25 MG tablet TAKE 1 TABLET BY MOUTH ONCE DAILY. (Patient taking differently: Take 25 mg by mouth daily. )  . lidocaine-prilocaine (EMLA) cream Apply a quarter sized glob 1 hour prior to treatment.  . Multiple Vitamin (TAB-A-VITE) TABS TAKE 1 TABLET BY MOUTH ONCE DAILY. (Patient taking differently: Take 1 tablet by mouth daily. )  . pomalidomide (POMALYST) 4 MG capsule Take 1 capsule (4 mg total) by mouth daily.  Marland Kitchen trolamine salicylate (ASPERCREME) 10 % cream Apply 1 application topically 2 (two) times daily.  Marland Kitchen acetaminophen  (TYLENOL 8 HOUR ARTHRITIS PAIN) 650 MG CR tablet Take 650 mg by mouth every 8 (eight) hours as needed for pain.  Marland Kitchen albuterol (VENTOLIN HFA) 108 (90 Base) MCG/ACT inhaler Inhale 2 puffs into the lungs every 6 (six) hours as needed for wheezing or shortness of breath. (Patient not taking: Reported on 02/26/2019)  . ibuprofen (ADVIL) 400 MG tablet Take 1 tablet (400 mg total) by mouth every 8 (eight) hours as needed. (Patient not taking: Reported on 03/05/2019)  . prochlorperazine (COMPAZINE) 10 MG tablet Take 1 tablet (10 mg total) by mouth every 6 (six) hours as needed for nausea or vomiting. (Patient not taking: Reported on 02/13/2019)  . [DISCONTINUED] LORazepam (ATIVAN) 1 MG tablet Take 1 tablet (1 mg total) by mouth as directed. Take 2 tabs 3 hours prior to procedure. Then take 1 tab 1 hour prior to procedure.   Facility-Administered Encounter Medications as of 03/05/2019  Medication  . heparin lock flush 100 unit/mL  . sodium chloride 0.9 % injection 10 mL    ALLERGIES:  Allergies  Allergen Reactions  . Diclofenac Swelling    Per pt, facial swelling     PHYSICAL EXAM:  ECOG Performance status: 1  There were no vitals filed for this visit. There were no vitals filed for this visit.  Physical Exam Vitals reviewed.  Constitutional:      Appearance: Normal appearance.  Cardiovascular:     Rate and Rhythm: Normal rate and regular rhythm.     Heart sounds: Normal heart sounds.  Pulmonary:     Effort: Pulmonary effort is normal.     Breath sounds: Normal breath sounds.  Abdominal:     General: There is no distension.     Palpations: Abdomen is soft. There is no mass.  Musculoskeletal:        General: No swelling.  Skin:    General: Skin is warm.  Neurological:     General: No focal deficit present.     Mental Status: She is alert and oriented to person, place, and time.  Psychiatric:        Mood and Affect: Mood normal.        Behavior: Behavior normal.   Trace edema  bilaterally.   LABORATORY DATA:  I  have reviewed the labs as listed.  CBC    Component Value Date/Time   WBC 4.7 03/05/2019 0928   RBC 3.57 (L) 03/05/2019 0928   HGB 10.9 (L) 03/05/2019 0928   HCT 33.8 (L) 03/05/2019 0928   PLT 132 (L) 03/05/2019 0928   MCV 94.7 03/05/2019 0928   MCH 30.5 03/05/2019 0928   MCHC 32.2 03/05/2019 0928   RDW 14.2 03/05/2019 0928   LYMPHSABS 1.3 03/05/2019 0928   MONOABS 0.5 03/05/2019 0928   EOSABS 0.1 03/05/2019 0928   BASOSABS 0.0 03/05/2019 0928   CMP Latest Ref Rng & Units 03/05/2019 01/27/2019 12/23/2018  Glucose 70 - 99 mg/dL 101(H) 102(H) 111(H)  BUN 8 - 23 mg/dL 22 28(H) 25(H)  Creatinine 0.44 - 1.00 mg/dL 1.03(H) 1.16(H) 1.14(H)  Sodium 135 - 145 mmol/L 138 137 136  Potassium 3.5 - 5.1 mmol/L 3.8 4.1 3.9  Chloride 98 - 111 mmol/L 101 99 99  CO2 22 - 32 mmol/L 28 27 26   Calcium 8.9 - 10.3 mg/dL 9.6 9.9 10.0  Total Protein 6.5 - 8.1 g/dL 7.9 8.1 8.4(H)  Total Bilirubin 0.3 - 1.2 mg/dL 0.5 0.5 0.6  Alkaline Phos 38 - 126 U/L 51 47 54  AST 15 - 41 U/L 18 26 21   ALT 0 - 44 U/L 16 18 13        DIAGNOSTIC IMAGING:  I have independently reviewed the scans and discussed with the patient.   I have reviewed Venita Lick LPN's note and agree with the documentation.  I personally performed a face-to-face visit, made revisions and my assessment and plan is as follows.    ASSESSMENT & PLAN:   Multiple myeloma ([place]) 1. 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 -Maintenance Ninlaro 3 weeks on 1 week off from March 2018 through December 2020 with progression. -She was last seen by me on 10/21/2018 when her M spike was 0.7 g. Subsequently had M spike has gone up. -Restaging PET scan on 02/10/2019 was negative for any osseous or extraosseous lesions. -Bone marrow biopsy on 02/14/2019 shows 10 to 15% plasma cells. Chromosome analysis  was normal. FISH panel shows del 17 P, deletion 13 q., duplication 1 q. -Labs on 01/27/2019 shows M spike of 1 g/dL. Free light chain ratio is 2.69. LDH is 190. Hemoglobin is 11.2. Creatinine is 1.16. Calcium is 9.9. Albumin is 3.9. Kappa light chains are 42 and lambda light chains are 15.6. -She has not received her pomalidomide yet.  We have sent it to [facility]. -We talked about starting her on carfilzomib today.  We have reviewed her labs.  Platelet count mildly decreased. -We discussed the various side effects of carfilzomib including fluid overload.  Will start at low-dose of 20 mg per metered squared and increase it next week.  She has received a 40 mg of dexamethasone as part of premeds. -She will start pomalidomide as soon as she receives it.  I will reevaluate her in 1 week for follow-up to see how she is tolerating carfilzomib.  2. Shortness of breath on exertion: -She reports worsening shortness of breath on exertion in the last 3 months. -2D echocardiogram on 03/09/2019 shows EF 60-65%. No left ventricular hypertrophy. Left ventricular internal cavity size was small. -CT chest PE protocol on 02/24/2019 shows no large pulmonary embolism. Stable 8 mm perifissural nodule in the right lung consistent with benign etiology. -Her oxygen saturation on  exertion dropped to 87% in our office.  We have ordered oxygen but has been denied by her insurance. -I have ordered a MRI of the heart for evaluation of amyloidosis.  We have also sent some blood work including troponin T and proBNP.  3. ID prophylaxis: -She started taking acyclovir 400 mg twice daily.  She will also continue 81 mg aspirin.  4. Neuropathy: -Numbness in the right hand fingertips and on and off in the right foot. She wears a glove out to the right hand. She reports burning type pain when she takes of the globe. -She is continuing gabapentin 300 mg twice daily.   Orders placed this encounter:  Orders Placed This Encounter   Procedures  . Brain natriuretic peptide  . Protime-INR  . APTT  . CBC with Differential/Platelet  . Comprehensive metabolic panel  . Magnesium      Derek Jack, MD [place] [phone]

## 2019-03-05 NOTE — Progress Notes (Signed)
Labs reviewed today with MD. Proceed with treatment today as planned. Consent obtained and beside teaching done this morning by Navigator.   Treatment given per orders. Patient tolerated it well without problems. Vitals stable and discharged home from clinic ambulatory. Follow up as scheduled.

## 2019-03-05 NOTE — Patient Instructions (Signed)
[facility] at [facility] Discharge Instructions  Labs drawn from portacath today   Thank you for choosing [facility] at [facility] to provide your oncology and hematology care.  To afford each patient quality time with our provider, please arrive at least 15 minutes before your scheduled appointment time.   If you have a lab appointment with the Cancer Center please come in thru the Main Entrance and check in at the main information desk.  You need to re-schedule your appointment should you arrive 10 or more minutes late.  We strive to give you quality time with our providers, and arriving late affects you and other patients whose appointments are after yours.  Also, if you no show three or more times for appointments you may be dismissed from the clinic at the providers discretion.     Again, thank you for choosing [facility].  Our hope is that these requests will decrease the amount of time that you wait before being seen by our physicians.       _____________________________________________________________  Should you have questions after your visit to [facility], please contact our office at [phone] between the hours of 8:00 a.m. and 4:30 p.m.  Voicemails left after 4:00 p.m. will not be returned until the following business day.  For prescription refill requests, have your pharmacy contact our office and allow 72 hours.    Due to Covid, you will need to wear a mask upon entering the hospital. If you do not have a mask, a mask will be given to you at the Main Entrance upon arrival. For doctor visits, patients may have 1 support person with them. For treatment visits, patients can not have anyone with them due to social distancing guidelines and our immunocompromised population.     

## 2019-03-05 NOTE — Patient Instructions (Signed)
[facility] °Discharge Instructions for Patients Receiving Chemotherapy ° °Today you received the following chemotherapy agents  ° °To help prevent nausea and vomiting after your treatment, we encourage you to take your nausea medication  °  °If you develop nausea and vomiting that is not controlled by your nausea medication, call the clinic.  ° °BELOW ARE SYMPTOMS THAT SHOULD BE REPORTED IMMEDIATELY: °· *FEVER GREATER THAN 100.5 F °· *CHILLS WITH OR WITHOUT FEVER °· NAUSEA AND VOMITING THAT IS NOT CONTROLLED WITH YOUR NAUSEA MEDICATION °· *UNUSUAL SHORTNESS OF BREATH °· *UNUSUAL BRUISING OR BLEEDING °· TENDERNESS IN MOUTH AND THROAT WITH OR WITHOUT PRESENCE OF ULCERS °· *URINARY PROBLEMS °· *BOWEL PROBLEMS °· UNUSUAL RASH °Items with * indicate a potential emergency and should be followed up as soon as possible. ° °Feel free to call the clinic should you have any questions or concerns. The clinic phone number is [phone]. ° °Please show the CHEMO ALERT CARD at check-in to the Emergency Department and triage nurse. ° ° °

## 2019-03-05 NOTE — Patient Instructions (Addendum)
[place] at [facility] Discharge Instructions  You were seen today by Dr. Delton Coombes. He went over your recent lab results. You will start your new treatment today. He will see you back in 1 week for labs and follow up.   Thank you for choosing [facility] at [facility] to provide your oncology and hematology care.  To afford each patient quality time with our provider, please arrive at least 15 minutes before your scheduled appointment time.   If you have a lab appointment with the [facility] please come in thru the  Main Entrance and check in at the main information desk  You need to re-schedule your appointment should you arrive 10 or more minutes late.  We strive to give you quality time with our providers, and arriving late affects you and other patients whose appointments are after yours.  Also, if you no show three or more times for appointments you may be dismissed from the clinic at the providers discretion.     Again, thank you for choosing [facility].  Our hope is that these requests will decrease the amount of time that you wait before being seen by our physicians.       _____________________________________________________________  Should you have questions after your visit to [facility], please contact our office at [phone] between the hours of 8:00 a.m. and 4:30 p.m.  Voicemails left after 4:00 p.m. will not be returned until the following business day.  For prescription refill requests, have your pharmacy contact our office and allow 72 hours.

## 2019-03-06 ENCOUNTER — Telehealth (HOSPITAL_COMMUNITY): Payer: Self-pay

## 2019-03-06 ENCOUNTER — Telehealth (HOSPITAL_COMMUNITY): Payer: Self-pay | Admitting: Emergency Medicine

## 2019-03-06 ENCOUNTER — Encounter (HOSPITAL_COMMUNITY): Payer: Self-pay

## 2019-03-06 LAB — TROPONIN T: Troponin T TROPT: <0.01 ng/mL (ref <0.011)

## 2019-03-06 LAB — BETA 2 MICROGLOBULIN, SERUM: Beta-2 Microglobulin: 3.3 mg/L — ABNORMAL HIGH (ref 0.6–2.4)

## 2019-03-06 NOTE — Telephone Encounter (Signed)
24 hour follow up- left message for patient to call for any concerns or questions.

## 2019-03-06 NOTE — Telephone Encounter (Signed)
Left message on voicemail with name and callback number Macel Yearsley RN Navigator Cardiac Imaging [facility] [phone] Office [phone] Cell  

## 2019-03-07 ENCOUNTER — Other Ambulatory Visit: Payer: Self-pay

## 2019-03-07 ENCOUNTER — Ambulatory Visit (HOSPITAL_COMMUNITY)
Admission: RE | Admit: 2019-03-07 | Discharge: 2019-03-07 | Disposition: A | Discharge status: Home / Self Care | Payer: Medicare Other | Source: Ambulatory Visit | Attending: Hematology | Admitting: Hematology

## 2019-03-07 DIAGNOSIS — C9000 Multiple myeloma not having achieved remission: Secondary | ICD-10-CM | POA: Diagnosis present

## 2019-03-07 DIAGNOSIS — R0602 Shortness of breath: Secondary | ICD-10-CM | POA: Insufficient documentation

## 2019-03-07 MED ORDER — GADOBUTROL 1 MMOL/ML IV SOLN
14.0000 mL | Freq: Once | INTRAVENOUS | Status: AC | PRN
  Administered 2019-03-07: 14 mL via INTRAVENOUS

## 2019-03-07 MED ORDER — HEPARIN SOD (PORK) LOCK FLUSH 100 UNIT/ML IV SOLN
500.0000 [IU] | INTRAVENOUS | Status: AC | PRN
  Administered 2019-03-07: 500 [IU]
  Filled 2019-03-07: qty 5

## 2019-03-07 MED ORDER — GADOTERIDOL 279.3 MG/ML IV SOLN: 14.0000 mL | Freq: Once | INTRAVENOUS | Status: DC | PRN

## 2019-03-10 ENCOUNTER — Encounter: Payer: Self-pay | Admitting: Gastroenterology

## 2019-03-12 ENCOUNTER — Inpatient Hospital Stay (HOSPITAL_BASED_OUTPATIENT_CLINIC_OR_DEPARTMENT_OTHER): Payer: Medicare Other | Admitting: Hematology

## 2019-03-12 ENCOUNTER — Inpatient Hospital Stay (HOSPITAL_COMMUNITY): Payer: Medicare Other | Attending: Hematology

## 2019-03-12 ENCOUNTER — Inpatient Hospital Stay (HOSPITAL_COMMUNITY): Payer: Medicare Other

## 2019-03-12 ENCOUNTER — Encounter (HOSPITAL_COMMUNITY): Payer: Self-pay | Admitting: Hematology

## 2019-03-12 ENCOUNTER — Encounter (HOSPITAL_COMMUNITY): Payer: Self-pay

## 2019-03-12 ENCOUNTER — Other Ambulatory Visit: Payer: Self-pay

## 2019-03-12 VITALS — BP 128/72 | HR 83 | Temp 96.9°F | Resp 20 | Wt 348.0 lb

## 2019-03-12 VITALS — BP 119/66 | HR 77 | Temp 98.2°F | Resp 18

## 2019-03-12 DIAGNOSIS — Z8249 Family history of ischemic heart disease and other diseases of the circulatory system: Secondary | ICD-10-CM | POA: Diagnosis not present

## 2019-03-12 DIAGNOSIS — R911 Solitary pulmonary nodule: Secondary | ICD-10-CM | POA: Diagnosis not present

## 2019-03-12 DIAGNOSIS — C9000 Multiple myeloma not having achieved remission: Secondary | ICD-10-CM

## 2019-03-12 DIAGNOSIS — Z79899 Other long term (current) drug therapy: Secondary | ICD-10-CM | POA: Insufficient documentation

## 2019-03-12 DIAGNOSIS — Z809 Family history of malignant neoplasm, unspecified: Secondary | ICD-10-CM | POA: Diagnosis not present

## 2019-03-12 DIAGNOSIS — R0609 Other forms of dyspnea: Secondary | ICD-10-CM | POA: Insufficient documentation

## 2019-03-12 DIAGNOSIS — Z9221 Personal history of antineoplastic chemotherapy: Secondary | ICD-10-CM | POA: Insufficient documentation

## 2019-03-12 DIAGNOSIS — R600 Localized edema: Secondary | ICD-10-CM | POA: Diagnosis not present

## 2019-03-12 DIAGNOSIS — G629 Polyneuropathy, unspecified: Secondary | ICD-10-CM | POA: Insufficient documentation

## 2019-03-12 DIAGNOSIS — Z5112 Encounter for antineoplastic immunotherapy: Secondary | ICD-10-CM | POA: Insufficient documentation

## 2019-03-12 DIAGNOSIS — M2550 Pain in unspecified joint: Secondary | ICD-10-CM | POA: Diagnosis not present

## 2019-03-12 DIAGNOSIS — D649 Anemia, unspecified: Secondary | ICD-10-CM | POA: Diagnosis not present

## 2019-03-12 DIAGNOSIS — R0602 Shortness of breath: Secondary | ICD-10-CM | POA: Diagnosis not present

## 2019-03-12 LAB — CBC WITH DIFFERENTIAL/PLATELET
Abs Immature Granulocytes: 0.04 10*3/uL (ref 0.00–0.07)
Basophils Absolute: 0 10*3/uL (ref 0.0–0.1)
Basophils Relative: 0 %
Eosinophils Absolute: 0.1 10*3/uL (ref 0.0–0.5)
Eosinophils Relative: 2 %
HCT: 34.9 % — ABNORMAL LOW (ref 36.0–46.0)
Hemoglobin: 11.3 g/dL — ABNORMAL LOW (ref 12.0–15.0)
Immature Granulocytes: 1 %
Lymphocytes Relative: 22 %
Lymphs Abs: 1.1 10*3/uL (ref 0.7–4.0)
MCH: 30.9 pg (ref 26.0–34.0)
MCHC: 32.4 g/dL (ref 30.0–36.0)
MCV: 95.4 fL (ref 80.0–100.0)
Monocytes Absolute: 0.4 10*3/uL (ref 0.1–1.0)
Monocytes Relative: 8 %
Neutro Abs: 3.3 10*3/uL (ref 1.7–7.7)
Neutrophils Relative %: 67 %
Platelets: 176 10*3/uL (ref 150–400)
RBC: 3.66 MIL/uL — ABNORMAL LOW (ref 3.87–5.11)
RDW: 14.2 % (ref 11.5–15.5)
WBC: 4.9 10*3/uL (ref 4.0–10.5)
nRBC: 0 % (ref 0.0–0.2)

## 2019-03-12 LAB — MAGNESIUM: Magnesium: 1.7 mg/dL (ref 1.7–2.4)

## 2019-03-12 LAB — COMPREHENSIVE METABOLIC PANEL
ALT: 16 U/L (ref 0–44)
AST: 21 U/L (ref 15–41)
Albumin: 4.1 g/dL (ref 3.5–5.0)
Alkaline Phosphatase: 50 U/L (ref 38–126)
Anion gap: 10 (ref 5–15)
BUN: 26 mg/dL — ABNORMAL HIGH (ref 8–23)
CO2: 27 mmol/L (ref 22–32)
Calcium: 9.9 mg/dL (ref 8.9–10.3)
Chloride: 101 mmol/L (ref 98–111)
Creatinine, Ser: 1.04 mg/dL — ABNORMAL HIGH (ref 0.44–1.00)
GFR calc Af Amer: >60 mL/min (ref >60)
GFR calc non Af Amer: 58 mL/min — ABNORMAL LOW (ref >60)
Glucose, Bld: 103 mg/dL — ABNORMAL HIGH (ref 70–99)
Potassium: 3.7 mmol/L (ref 3.5–5.1)
Sodium: 138 mmol/L (ref 135–145)
Total Bilirubin: 0.6 mg/dL (ref 0.3–1.2)
Total Protein: 7.8 g/dL (ref 6.5–8.1)

## 2019-03-12 MED ORDER — SODIUM CHLORIDE 0.9 % IV SOLN: Freq: Once | INTRAVENOUS | Status: AC

## 2019-03-12 MED ORDER — SODIUM CHLORIDE 0.9% FLUSH
10.0000 mL | INTRAVENOUS | Status: DC | PRN
  Administered 2019-03-12: 10 mL

## 2019-03-12 MED ORDER — SODIUM CHLORIDE 0.9 % IV SOLN
40.0000 mg | Freq: Once | INTRAVENOUS | Status: AC
  Administered 2019-03-12: 09:00:00 40 mg via INTRAVENOUS
  Filled 2019-03-12: qty 4

## 2019-03-12 MED ORDER — HEPARIN SOD (PORK) LOCK FLUSH 100 UNIT/ML IV SOLN
500.0000 [IU] | Freq: Once | INTRAVENOUS | Status: AC | PRN
  Administered 2019-03-12: 11:00:00 500 [IU]

## 2019-03-12 MED ORDER — DEXTROSE 5 % IV SOLN
54.5000 mg/m2 | Freq: Once | INTRAVENOUS | Status: AC
  Administered 2019-03-12: 120 mg via INTRAVENOUS
  Filled 2019-03-12: qty 60

## 2019-03-12 NOTE — Patient Instructions (Signed)
[facility] at [facility] Discharge Instructions  Labs drawn from portacath today   Thank you for choosing [facility] at [facility] to provide your oncology and hematology care.  To afford each patient quality time with our provider, please arrive at least 15 minutes before your scheduled appointment time.   If you have a lab appointment with the Cancer Center please come in thru the Main Entrance and check in at the main information desk.  You need to re-schedule your appointment should you arrive 10 or more minutes late.  We strive to give you quality time with our providers, and arriving late affects you and other patients whose appointments are after yours.  Also, if you no show three or more times for appointments you may be dismissed from the clinic at the providers discretion.     Again, thank you for choosing [facility].  Our hope is that these requests will decrease the amount of time that you wait before being seen by our physicians.       _____________________________________________________________  Should you have questions after your visit to [facility], please contact our office at [phone] between the hours of 8:00 a.m. and 4:30 p.m.  Voicemails left after 4:00 p.m. will not be returned until the following business day.  For prescription refill requests, have your pharmacy contact our office and allow 72 hours.    Due to Covid, you will need to wear a mask upon entering the hospital. If you do not have a mask, a mask will be given to you at the Main Entrance upon arrival. For doctor visits, patients may have 1 support person with them. For treatment visits, patients can not have anyone with them due to social distancing guidelines and our immunocompromised population.     

## 2019-03-12 NOTE — Patient Instructions (Signed)
[facility] Discharge Instructions for Patients Receiving Chemotherapy   Beginning January 23rd 2017 lab work for the Cancer Center will be done in the  Main lab at [facility] on 1st floor. If you have a lab appointment with the Cancer Center please come in thru the  Main Entrance and check in at the main information desk   Today you received the following chemotherapy agents Kyprolis. Follow-up as scheduled. Call clinic for any questions or concerns  To help prevent nausea and vomiting after your treatment, we encourage you to take your nausea medication   If you develop nausea and vomiting, or diarrhea that is not controlled by your medication, call the clinic.  The clinic phone number is [phone]. Office hours are Monday-Friday 8:30am-5:00pm.  BELOW ARE SYMPTOMS THAT SHOULD BE REPORTED IMMEDIATELY:  *FEVER GREATER THAN 101.0 F  *CHILLS WITH OR WITHOUT FEVER  NAUSEA AND VOMITING THAT IS NOT CONTROLLED WITH YOUR NAUSEA MEDICATION  *UNUSUAL SHORTNESS OF BREATH  *UNUSUAL BRUISING OR BLEEDING  TENDERNESS IN MOUTH AND THROAT WITH OR WITHOUT PRESENCE OF ULCERS  *URINARY PROBLEMS  *BOWEL PROBLEMS  UNUSUAL RASH Items with * indicate a potential emergency and should be followed up as soon as possible. If you have an emergency after office hours please contact your primary care physician or go to the nearest emergency department.  Please call the clinic during office hours if you have any questions or concerns.   You may also contact the Patient Navigator at [phone] should you have any questions or need assistance in obtaining follow up care.      Resources For Cancer Patients and their Caregivers ? American Cancer Society: Can assist with transportation, wigs, general needs, runs Look Good Feel Better.        1-888-227-6333 ? Cancer Care: Provides financial assistance, online support groups, medication/co-pay assistance.  1-800-813-HOPE  (4673) ? [facility] Assists [place] cancer patients and their families through emotional , educational and financial support.  336-427-4357 ? [name] Where to apply for food stamps, Medicaid and utility assistance. 336-342-1394 ? [name]: Transportation to medical appointments. 336-347-2287 ? Social Security Administration: May apply for disability if have a Stage IV cancer. 336-342-7796 1-800-772-1213 ? [place] [facility]ging, Disability and Transit Services: Assists with nutrition, care and transit needs. 336-349-2343         

## 2019-03-12 NOTE — Assessment & Plan Note (Signed)
1. 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 -Maintenance [place] 3 weeks on 1 week off from March 2018 through December 2020 with progression. -She was last seen by me on 10/21/2018 when her M spike was 0.7 g. Subsequently had M spike has gone up. -Restaging PET scan on 02/10/2019 was negative for any osseous or extraosseous lesions. -Bone marrow biopsy on 02/14/2019 shows 10 to 15% plasma cells. Chromosome analysis was normal. FISH panel shows del 17 P, deletion 13 q., duplication 1 q. -Labs on 01/27/2019 shows M spike of 1 g/dL. Free light chain ratio is 2.69. LDH is 190. Hemoglobin is 11.2. Creatinine is 1.16. Calcium is 9.9. Albumin is 3.9. Kappa light chains are 42 and lambda light chains are 15.6. -Mild was started on 03/08/2019. -Cycle 1 of carfilzomib on 03/05/2019.  She also received 40 mg of IV dexamethasone.  She did not experience any respiratory difficulty. -We have talked about increasing her dose of carfilzomib today.  We also talked about potential side effects. -We reviewed her labs.  She will proceed with her treatment today.  We will reevaluate her in 1 week.  2. Shortness of breath on exertion: -She reports worsening shortness of breath on exertion in the last 3 months. -2D echocardiogram on 03/09/2019 shows EF 60-65%. No left ventricular hypertrophy. Left ventricular internal cavity size was small. -CT chest PE protocol on 02/24/2019 shows no large pulmonary embolism. Stable 8 mm perifissural nodule in the right lung consistent with benign etiology. -We reviewed results of cardiac MRI from 03/07/2019 which showed normal LV EF of 56%.  No amyloid was seen. -Troponin T, proBNP were also negative.  3. ID prophylaxis: -She started taking acyclovir 400 mg twice daily.  She will also continue 81 mg aspirin.  4. Neuropathy: -Numbness in the right hand fingertips and on and off  in the right foot. She wears a glove out to the right hand. She reports burning type pain when she takes of the globe. -She is continuing gabapentin 300 mg twice daily. 

## 2019-03-12 NOTE — Progress Notes (Signed)
Patient has been assessed, vital signs and labs have been reviewed by Dr. Katragadda. ANC, Creatinine, LFTs, and Platelets are within treatment parameters per Dr. Katragadda. The patient is good to proceed with treatment at this time.  

## 2019-03-12 NOTE — Progress Notes (Signed)
[place], [place] [address]   CLINIC:  Medical Oncology/Hematology  PCP:  Abran Richard, MD [address] [place] [address] [phone]   REASON FOR VISIT:  Follow-up for multiple myeloma   BRIEF ONCOLOGIC HISTORY:  Oncology History  Multiple myeloma ([place])  08/07/2014 Imaging   Bone Survey- No lytic lesions are noted in the visualized skeleton.   09/03/2014 Bone Marrow Biopsy   NORMOCELLULAR BONE MARROW FOR AGE WITH PLASMA CELL NEOPLASM.  The plasma cell component is increased in the marrow representing an estimated 18% of all cells. Cytogenetics with 13q-, 17p- (high risk disease)   09/03/2014 Pathology Results   Cytogenetics with 13q-, 17p- (high risk disease)   09/23/2014 Initial Diagnosis   Multiple myeloma   09/30/2014 PET scan   No abnormal hypermetabolism in the neck, chest, abdomen or pelvis.   10/05/2014 - 03/22/2015 Chemotherapy   RVD   10/28/2014 Treatment Plan Change   Issues related to getting Revlimid in a timely fashion, therefore, she received her Revlimid on 9/21 resulting in a 12 day cycle this time instead of a 14 day cycle   11/02/2014 Imaging   CTA chest- No evidence for a large or central pulmonary embolism as described.  8 mm density along the right minor fissure could represent focal pleural thickening but indeterminate. If the patient is at high risk for bronchogenic carcinoma, follow-up c   11/23/2014 Miscellaneous   Zometa 4 mg IV monthly   04/07/2015 Bone Marrow Biopsy   Normocellular marrow with 2-4% clonal plasma cells by immunohistochemistry. FISH and cytogenetics were normal [facility])     04/2015 Miscellaneous   PRETRANSPLANT EVALUATION:  Pulmonary function tests: FEV1 100.3% / DLCO 97.9%  Echocardiogram: Normal LV function with EF 60-65%    04/27/2015 Procedure   Stem cell mobilization with filgrastim and Mozobil [facility])   05/06/2015 Miscellaneous   BMT conditioning regimen with high-dose  Melphalan given [facility], [name]); Day -1   05/07/2015 Bone Marrow Transplant   Outpatient autologous stem cell transplant [facility], [name]); Day 0   05/18/2015 Miscellaneous   WBC engraftment;  did not require platelet transfusion during her transplant process. Lowest platelet count 28,000    05/20/2015 Procedure   Tunneled catheter removed [facility])   09/08/2015 -  Chemotherapy   Velcade every 2 weeks   12/15/2015 Miscellaneous   Zometa re-instituted.    12/23/2015 Imaging   Bone density- BMD as determined from [facility] Radius 33% is 0.799 g/cm2 with a T-Score of 1.2. This patient is considered normal according to [facility]) criteria.   04/17/2016 Miscellaneous   Started maintenance with Ninlaro.   05/04/2016 Treatment Plan Change   Zometa switched to Xgeva injection monthly given difficult IV access.    03/05/2019 -  Chemotherapy   The patient had carfilzomib (KYPROLIS) 40 mg in dextrose 5 % 50 mL chemo infusion, 18 mg/m2 = 44 mg, Intravenous,  Once, 1 of 4 cycles Dose modification: 56 mg/m2 (original dose 56 mg/m2, Cycle 1, Reason: Provider Judgment) Administration: 40 mg (03/05/2019), 120 mg (03/12/2019)  for chemotherapy treatment.       CANCER STAGING: Cancer Staging Multiple myeloma [facility]) Staging form: Multiple Myeloma, AJCC 6th Edition - Clinical stage from 10/26/2014: Stage IIA - Signed by Baird Cancer, PA-C on 10/26/2014 - Pathologic: No stage assigned - Unsigned    INTERVAL HISTORY:  Kelli Hurley 64 y.o. female seen for follow-up of multiple myeloma.  Received first dose  of carfilzomib last week.  She also started taking Pomalyst on Saturday night.  She has not experienced any GI side effects.  Appetite is 100%.  Energy levels are 50%.  Pain in the knees has been stable.  Right hand numbness is also stable.  Dyspnea on exertion is present.  REVIEW OF SYSTEMS:  Review of Systems  Respiratory: Positive for shortness of breath.     Musculoskeletal: Positive for arthralgias.  Neurological: Positive for numbness.  All other systems reviewed and are negative.    PAST MEDICAL/SURGICAL HISTORY:  Past Medical History:  Diagnosis Date  . Anemia   . Arthritis   . Cancer ([place])   . Claustrophobia 10/05/2014  . Hypertension   . Leukopenia 08/03/2014  . Normocytic hypochromic anemia 08/03/2014  . Renal disorder    stage 3    Past Surgical History:  Procedure Laterality Date  . ABDOMINAL HYSTERECTOMY    . CARDIAC SURGERY    . OTHER SURGICAL HISTORY     heart surgery as infant to "repair hole in heart"  . PORTACATH PLACEMENT Left 02/21/2019   Procedure: INSERTION PORT-A-CATH;  Surgeon: Virl Cagey, MD;  Location: [facility];  Service: General;  Laterality: Left;     SOCIAL HISTORY:  Social History   Socioeconomic History  . Marital status: Legally Separated    Spouse name: Not on file  . Number of children: Not on file  . Years of education: Not on file  . Highest education level: Not on file  Occupational History  . Not on file  Tobacco Use  . Smoking status: Never Smoker  . Smokeless tobacco: Never Used  Substance and Sexual Activity  . Alcohol use: No  . Drug use: No  . Sexual activity: Not on file    Comment: divorced- 2 daughters  Other Topics Concern  . Not on file  Social History Narrative  . Not on file   Social Determinants of Health   Financial Resource Strain:   . Difficulty of Paying Living Expenses: Not on file  Food Insecurity:   . Worried About Charity fundraiser in the Last Year: Not on file  . Ran Out of Food in the Last Year: Not on file  Transportation Needs:   . Lack of Transportation (Medical): Not on file  . Lack of Transportation (Non-Medical): Not on file  Physical Activity:   . Days of Exercise per Week: Not on file  . Minutes of Exercise per Session: Not on file  Stress:   . Feeling of Stress : Not on file  Social Connections:   . Frequency of Communication  with Friends and Family: Not on file  . Frequency of Social Gatherings with Friends and Family: Not on file  . Attends Religious Services: Not on file  . Active Member of Clubs or Organizations: Not on file  . Attends Archivist Meetings: Not on file  . Marital Status: Not on file  Intimate Partner Violence:   . Fear of Current or Ex-Partner: Not on file  . Emotionally Abused: Not on file  . Physically Abused: Not on file  . Sexually Abused: Not on file    FAMILY HISTORY:  Family History  Problem Relation Age of Onset  . Cancer Mother   . Hypertension Mother   . Cancer Father   . Hypertension Father   . Cancer Maternal Grandmother   . Hypertension Sister   . Hypertension Brother     CURRENT MEDICATIONS:  Outpatient  Encounter Medications as of 03/12/2019  Medication Sig  . acetaminophen (TYLENOL 8 HOUR ARTHRITIS PAIN) 650 MG CR tablet Take 650 mg by mouth every 8 (eight) hours as needed for pain.  Marland Kitchen acyclovir (ZOVIRAX) 400 MG tablet Take 1 tablet (400 mg total) by mouth 2 (two) times daily.  Marland Kitchen albuterol (VENTOLIN HFA) 108 (90 Base) MCG/ACT inhaler Inhale 2 puffs into the lungs every 6 (six) hours as needed for wheezing or shortness of breath. (Patient not taking: Reported on 02/26/2019)  . Ascorbic Acid (VITAMIN C) 1000 MG tablet Take 1,000 mg by mouth daily.   Marland Kitchen aspirin EC 81 MG tablet Take 81 mg by mouth daily.  Marland Kitchen CARFILZOMIB IV Inject into the vein.  . cholecalciferol (VITAMIN D) 1000 units tablet Take 1,000 Units by mouth daily.  Marland Kitchen gabapentin (NEURONTIN) 300 MG capsule Take 1 capsule (300 mg total) by mouth 3 (three) times daily. (Patient taking differently: Take 300 mg by mouth 2 (two) times daily. )  . hydrochlorothiazide (HYDRODIURIL) 25 MG tablet TAKE 1 TABLET BY MOUTH ONCE DAILY. (Patient taking differently: Take 25 mg by mouth daily. )  . ibuprofen (ADVIL) 400 MG tablet Take 1 tablet (400 mg total) by mouth every 8 (eight) hours as needed. (Patient not taking:  Reported on 03/05/2019)  . lidocaine-prilocaine (EMLA) cream Apply a quarter sized glob 1 hour prior to treatment.  . Multiple Vitamin (TAB-A-VITE) TABS TAKE 1 TABLET BY MOUTH ONCE DAILY. (Patient taking differently: Take 1 tablet by mouth daily. )  . pomalidomide (POMALYST) 4 MG capsule Take 1 capsule (4 mg total) by mouth daily.  . prochlorperazine (COMPAZINE) 10 MG tablet Take 1 tablet (10 mg total) by mouth every 6 (six) hours as needed for nausea or vomiting. (Patient not taking: Reported on 02/13/2019)  . trolamine salicylate (ASPERCREME) 10 % cream Apply 1 application topically 2 (two) times daily.   Facility-Administered Encounter Medications as of 03/12/2019  Medication  . heparin lock flush 100 unit/mL  . sodium chloride 0.9 % injection 10 mL    ALLERGIES:  Allergies  Allergen Reactions  . Diclofenac Swelling    Per pt, facial swelling     PHYSICAL EXAM:  ECOG Performance status: 1  Vitals:   03/12/19 0754  BP: 128/72  Pulse: 83  Resp: 20  Temp: (!) 96.9 F (36.1 C)  SpO2: 98%   Filed Weights   03/12/19 0754  Weight: (!) 348 lb (157.9 kg)    Physical Exam Vitals reviewed.  Constitutional:      Appearance: Normal appearance.  Cardiovascular:     Rate and Rhythm: Normal rate and regular rhythm.     Heart sounds: Normal heart sounds.  Pulmonary:     Effort: Pulmonary effort is normal.     Breath sounds: Normal breath sounds.  Abdominal:     General: There is no distension.     Palpations: Abdomen is soft. There is no mass.  Musculoskeletal:        General: No swelling.  Skin:    General: Skin is warm.  Neurological:     General: No focal deficit present.     Mental Status: She is alert and oriented to person, place, and time.  Psychiatric:        Mood and Affect: Mood normal.        Behavior: Behavior normal.   Trace edema bilaterally.   LABORATORY DATA:  I have reviewed the labs as listed.  CBC    Component Value Date/Time  WBC 4.9 03/12/2019  0807   RBC 3.66 (L) 03/12/2019 0807   HGB 11.3 (L) 03/12/2019 0807   HCT 34.9 (L) 03/12/2019 0807   PLT 176 03/12/2019 0807   MCV 95.4 03/12/2019 0807   MCH 30.9 03/12/2019 0807   MCHC 32.4 03/12/2019 0807   RDW 14.2 03/12/2019 0807   LYMPHSABS 1.1 03/12/2019 0807   MONOABS 0.4 03/12/2019 0807   EOSABS 0.1 03/12/2019 0807   BASOSABS 0.0 03/12/2019 0807   CMP Latest Ref Rng & Units 03/12/2019 03/05/2019 01/27/2019  Glucose 70 - 99 mg/dL 103(H) 101(H) 102(H)  BUN 8 - 23 mg/dL 26(H) 22 28(H)  Creatinine 0.44 - 1.00 mg/dL 1.04(H) 1.03(H) 1.16(H)  Sodium 135 - 145 mmol/L 138 138 137  Potassium 3.5 - 5.1 mmol/L 3.7 3.8 4.1  Chloride 98 - 111 mmol/L 101 101 99  CO2 22 - 32 mmol/L _0 Calcium 8.9 - 10.3 mg/dL 9.9 9.6 9.9  Total Protein 6.5 - 8.1 g/dL 7.8 7.9 8.1  Total Bilirubin 0.3 - 1.2 mg/dL 0.6 0.5 0.5  Alkaline Phos 38 - 126 U/L 50 51 47  AST 15 - 41 U/L _1 ALT 0 - 44 U/L _2 DIAGNOSTIC IMAGING:  I have independently reviewed the scans and discussed with the patient.   I have reviewed Venita Lick LPN's note and agree with the documentation.  I personally performed a face-to-face visit, made revisions and my assessment and plan is as follows.    ASSESSMENT & PLAN:   Multiple myeloma ([place]) 1. 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 -Maintenance Ninlaro 3 weeks on 1 week off from March 2018 through December 2020 with progression. -She was last seen by me on 10/21/2018 when her M spike was 0.7 g. Subsequently had M spike has gone up. -Restaging PET scan on 02/10/2019 was negative for any osseous or extraosseous lesions. -Bone marrow biopsy on 02/14/2019 shows 10 to 15% plasma cells. Chromosome analysis was normal. FISH panel shows del 17 P, deletion 13 q., duplication 1 q. -Labs on 01/27/2019 shows M spike of 1 g/dL. Free light chain ratio is  2.69. LDH is 190. Hemoglobin is 11.2. Creatinine is 1.16. Calcium is 9.9. Albumin is 3.9. Kappa light chains are 42 and lambda light chains are 15.6. -Mild was started on 03/08/2019. -Cycle 1 of carfilzomib on 03/05/2019.  She also received 40 mg of IV dexamethasone.  She did not experience any respiratory difficulty. -We have talked about increasing her dose of carfilzomib today.  We also talked about potential side effects. -We reviewed her labs.  She will proceed with her treatment today.  We will reevaluate her in 1 week.  2. Shortness of breath on exertion: -She reports worsening shortness of breath on exertion in the last 3 months. -2D echocardiogram on 03/09/2019 shows EF 60-65%. No left ventricular hypertrophy. Left ventricular internal cavity size was small. -CT chest PE protocol on 02/24/2019 shows no large pulmonary embolism. Stable 8 mm perifissural nodule in the right lung consistent with benign etiology. -We reviewed results of cardiac MRI from 03/07/2019 which showed normal LV EF of 56%.  No amyloid was seen. -Troponin T, proBNP were also negative.  3. ID prophylaxis: -She started taking acyclovir 400 mg twice daily.  She will also continue 81 mg aspirin.  4. Neuropathy: -Numbness in the right hand fingertips  and on and off in the right foot. She wears a glove out to the right hand. She reports burning type pain when she takes of the globe. -She is continuing gabapentin 300 mg twice daily.   Orders placed this encounter:  Orders Placed This Encounter  Procedures  . Protein electrophoresis, serum  . Kappa/lambda light chains  . Lactate dehydrogenase  . CBC with Differential/Platelet  . Comprehensive metabolic panel  . Magnesium      Derek Jack, MD [place] [phone]

## 2019-03-12 NOTE — Progress Notes (Signed)
03/12/19  Dose confirmed today at 56 mg/m2  T.O. Dr Rhys Martini, PharmD

## 2019-03-12 NOTE — Patient Instructions (Addendum)
[name] at [facility] Discharge Instructions  You were seen today by Dr. Katragadda. He went over your recent lab results. He will see you back in 1 week for labs, treatment and follow up.   Thank you for choosing [facility] at [facility] to provide your oncology and hematology care.  To afford each patient quality time with our provider, please arrive at least 15 minutes before your scheduled appointment time.   If you have a lab appointment with the Cancer Center please come in thru the  Main Entrance and check in at the main information desk  You need to re-schedule your appointment should you arrive 10 or more minutes late.  We strive to give you quality time with our providers, and arriving late affects you and other patients whose appointments are after yours.  Also, if you no show three or more times for appointments you may be dismissed from the clinic at the providers discretion.     Again, thank you for choosing [facility].  Our hope is that these requests will decrease the amount of time that you wait before being seen by our physicians.       _____________________________________________________________  Should you have questions after your visit to  Cancer Center, please contact our office at [phone] between the hours of 8:00 a.m. and 4:30 p.m.  Voicemails left after 4:00 p.m. will not be returned until the following business day.  For prescription refill requests, have your pharmacy contact our office and allow 72 hours.    Cancer Center Support Programs:   > Cancer Support Group  2nd Tuesday of the month 1pm-2pm, Journey Room    

## 2019-03-12 NOTE — Progress Notes (Signed)
0851 Labs reviewed with and pt seen by Dr. Delton Coombes and pt approved for Kyprolis infusion today per MD                                   Kelli Hurley tolerated Kyprolis infusion well without complaints or incident. Pt continues to take her Pomalyst as prescribed without any issues. VSS upon discharge. Pt discharged via wheelchair in satisfactory condition

## 2019-03-20 ENCOUNTER — Other Ambulatory Visit: Payer: Self-pay

## 2019-03-20 ENCOUNTER — Inpatient Hospital Stay (HOSPITAL_COMMUNITY): Payer: Medicare Other

## 2019-03-20 ENCOUNTER — Other Ambulatory Visit (HOSPITAL_COMMUNITY): Payer: Self-pay | Admitting: *Deleted

## 2019-03-20 ENCOUNTER — Inpatient Hospital Stay (HOSPITAL_BASED_OUTPATIENT_CLINIC_OR_DEPARTMENT_OTHER): Payer: Medicare Other | Admitting: Hematology

## 2019-03-20 ENCOUNTER — Encounter (HOSPITAL_COMMUNITY): Payer: Self-pay | Admitting: Hematology

## 2019-03-20 VITALS — BP 116/64 | HR 69 | Temp 96.9°F | Resp 18

## 2019-03-20 VITALS — BP 105/55 | HR 78 | Temp 97.1°F | Resp 20 | Wt 347.4 lb

## 2019-03-20 DIAGNOSIS — C9000 Multiple myeloma not having achieved remission: Secondary | ICD-10-CM

## 2019-03-20 DIAGNOSIS — Z5112 Encounter for antineoplastic immunotherapy: Secondary | ICD-10-CM | POA: Diagnosis not present

## 2019-03-20 LAB — CBC WITH DIFFERENTIAL/PLATELET
Abs Immature Granulocytes: 0.05 10*3/uL (ref 0.00–0.07)
Basophils Absolute: 0 10*3/uL (ref 0.0–0.1)
Basophils Relative: 1 %
Eosinophils Absolute: 0.1 10*3/uL (ref 0.0–0.5)
Eosinophils Relative: 2 %
HCT: 32 % — ABNORMAL LOW (ref 36.0–46.0)
Hemoglobin: 10.2 g/dL — ABNORMAL LOW (ref 12.0–15.0)
Immature Granulocytes: 1 %
Lymphocytes Relative: 17 %
Lymphs Abs: 0.7 10*3/uL (ref 0.7–4.0)
MCH: 30.1 pg (ref 26.0–34.0)
MCHC: 31.9 g/dL (ref 30.0–36.0)
MCV: 94.4 fL (ref 80.0–100.0)
Monocytes Absolute: 0.7 10*3/uL (ref 0.1–1.0)
Monocytes Relative: 17 %
Neutro Abs: 2.5 10*3/uL (ref 1.7–7.7)
Neutrophils Relative %: 62 %
Platelets: 101 10*3/uL — ABNORMAL LOW (ref 150–400)
RBC: 3.39 MIL/uL — ABNORMAL LOW (ref 3.87–5.11)
RDW: 14.1 % (ref 11.5–15.5)
WBC: 4 10*3/uL (ref 4.0–10.5)
nRBC: 0 % (ref 0.0–0.2)

## 2019-03-20 LAB — COMPREHENSIVE METABOLIC PANEL
ALT: 17 U/L (ref 0–44)
AST: 16 U/L (ref 15–41)
Albumin: 3.7 g/dL (ref 3.5–5.0)
Alkaline Phosphatase: 46 U/L (ref 38–126)
Anion gap: 4 — ABNORMAL LOW (ref 5–15)
BUN: 22 mg/dL (ref 8–23)
CO2: 28 mmol/L (ref 22–32)
Calcium: 8.7 mg/dL — ABNORMAL LOW (ref 8.9–10.3)
Chloride: 104 mmol/L (ref 98–111)
Creatinine, Ser: 0.94 mg/dL (ref 0.44–1.00)
GFR calc Af Amer: >60 mL/min (ref >60)
GFR calc non Af Amer: >60 mL/min (ref >60)
Glucose, Bld: 106 mg/dL — ABNORMAL HIGH (ref 70–99)
Potassium: 3.7 mmol/L (ref 3.5–5.1)
Sodium: 136 mmol/L (ref 135–145)
Total Bilirubin: 0.7 mg/dL (ref 0.3–1.2)
Total Protein: 7.1 g/dL (ref 6.5–8.1)

## 2019-03-20 LAB — MAGNESIUM: Magnesium: 1.7 mg/dL (ref 1.7–2.4)

## 2019-03-20 MED ORDER — SODIUM CHLORIDE 0.9 % IV SOLN
40.0000 mg | Freq: Once | INTRAVENOUS | Status: AC
  Administered 2019-03-20: 10:00:00 40 mg via INTRAVENOUS
  Filled 2019-03-20: qty 4

## 2019-03-20 MED ORDER — DEXAMETHASONE 4 MG PO TABS: 40.0000 mg | ORAL_TABLET | ORAL | 3 refills | Status: DC

## 2019-03-20 MED ORDER — SODIUM CHLORIDE 0.9% FLUSH
10.0000 mL | INTRAVENOUS | Status: DC | PRN
  Administered 2019-03-20: 08:00:00 10 mL

## 2019-03-20 MED ORDER — HEPARIN SOD (PORK) LOCK FLUSH 100 UNIT/ML IV SOLN
500.0000 [IU] | Freq: Once | INTRAVENOUS | Status: AC | PRN
  Administered 2019-03-20: 12:00:00 500 [IU]

## 2019-03-20 MED ORDER — SODIUM CHLORIDE 0.9 % IV SOLN: Freq: Once | INTRAVENOUS | Status: AC

## 2019-03-20 MED ORDER — DEXTROSE 5 % IV SOLN
54.5000 mg/m2 | Freq: Once | INTRAVENOUS | Status: AC
  Administered 2019-03-20: 11:00:00 120 mg via INTRAVENOUS
  Filled 2019-03-20: qty 60

## 2019-03-20 NOTE — Assessment & Plan Note (Signed)
1.  Relapsed IgG kappa myeloma with high risk features: -Cycle 1 of carfilzomib on 03/05/2019.  She received higher dose of carfilzomib on 03/12/2019. -Pomalyst 4 mg 3 weeks on 1 week off started on 03/08/2019. -She did not experience any shortness of breath or fluid retention. -I have reviewed her labs.  CBC showed mild anemia and thrombocytopenia consistent with treatment. -Creatinine, calcium and LFTs were normal.  We will proceed with day 8 of carfilzomib at 56 mg per metered squared. -Next week will be her off week.  We will see her back in 2 weeks for follow-up.  2.  ID prophylaxis: -She will continue acyclovir 400 mg twice daily.  She will also continue 81 mg aspirin daily.  3.  Neuropathy: -She will continue gabapentin 300 mg twice daily.  Numbness in the right hand fingertips stable.  4.  Shortness of breath on exertion: -2D echo on 03/09/2019 shows EF 60-65%.  No left ventricular hypertrophy. -CT chest PE protocol on 02/24/2019 shows no PE. -Cardiac MRI from 03/07/2019 shows LVEF 56%.  No amyloid seen. -Troponin T, proBNP negative.

## 2019-03-20 NOTE — Patient Instructions (Addendum)
[place] at [facility] Discharge Instructions  You were seen today by Dr. Delton Coombes. He went over your recent lab results.  He will send in a new prescription of Dexamethasone into your pharmacy. Take 10 tablets at one time once on the week you are off treatment.Take the Pomalyst until next Friday. He will see you back in 2 weeks for labs, treatment and follow up.   Thank you for choosing [place] at [facility] to provide your oncology and hematology care.  To afford each patient quality time with our provider, please arrive at least 15 minutes before your scheduled appointment time.   If you have a lab appointment with the [facility] [place] please come in thru the  Main Entrance and check in at the main information desk  You need to re-schedule your appointment should you arrive 10 or more minutes late.  We strive to give you quality time with our providers, and arriving late affects you and other patients whose appointments are after yours.  Also, if you no show three or more times for appointments you may be dismissed from the clinic at the providers discretion.     Again, thank you for choosing [facility].  Our hope is that these requests will decrease the amount of time that you wait before being seen by our physicians.       _____________________________________________________________  Should you have questions after your visit to [facility], please contact our office at [phone] between the hours of 8:00 a.m. and 4:30 p.m.  Voicemails left after 4:00 p.m. will not be returned until the following business day.  For prescription refill requests, have your pharmacy contact our office and allow 72 hours.    Cancer Center Support Programs:   > Cancer Support Group  2nd Tuesday of the month 1pm-2pm, Journey Room

## 2019-03-20 NOTE — Progress Notes (Signed)
Labs reviewed with MD today. Proceed with treatment as planned.   Treatment given per orders. Patient tolerated it well without problems. Vitals stable and discharged home from clinic via wheelchair. Follow up as scheduled.

## 2019-03-20 NOTE — Patient Instructions (Signed)
[facility] at [facility] Discharge Instructions  Labs drawn from portacath today   Thank you for choosing [facility] at [facility] to provide your oncology and hematology care.  To afford each patient quality time with our provider, please arrive at least 15 minutes before your scheduled appointment time.   If you have a lab appointment with the Cancer Center please come in thru the Main Entrance and check in at the main information desk.  You need to re-schedule your appointment should you arrive 10 or more minutes late.  We strive to give you quality time with our providers, and arriving late affects you and other patients whose appointments are after yours.  Also, if you no show three or more times for appointments you may be dismissed from the clinic at the providers discretion.     Again, thank you for choosing [facility].  Our hope is that these requests will decrease the amount of time that you wait before being seen by our physicians.       _____________________________________________________________  Should you have questions after your visit to [facility], please contact our office at [phone] between the hours of 8:00 a.m. and 4:30 p.m.  Voicemails left after 4:00 p.m. will not be returned until the following business day.  For prescription refill requests, have your pharmacy contact our office and allow 72 hours.    Due to Covid, you will need to wear a mask upon entering the hospital. If you do not have a mask, a mask will be given to you at the Main Entrance upon arrival. For doctor visits, patients may have 1 support person with them. For treatment visits, patients can not have anyone with them due to social distancing guidelines and our immunocompromised population.     

## 2019-03-20 NOTE — Progress Notes (Signed)
[place], [place] [address]   CLINIC:  Medical Oncology/Hematology  PCP:  Abran Richard, MD [address] [place] [address] [phone]   REASON FOR VISIT:  Follow-up for multiple myeloma   BRIEF ONCOLOGIC HISTORY:  Oncology History  Multiple myeloma ([place])  08/07/2014 Imaging   Bone Survey- No lytic lesions are noted in the visualized skeleton.   09/03/2014 Bone Marrow Biopsy   NORMOCELLULAR BONE MARROW FOR AGE WITH PLASMA CELL NEOPLASM.  The plasma cell component is increased in the marrow representing an estimated 18% of all cells. Cytogenetics with 13q-, 17p- (high risk disease)   09/03/2014 Pathology Results   Cytogenetics with 13q-, 17p- (high risk disease)   09/23/2014 Initial Diagnosis   Multiple myeloma   09/30/2014 PET scan   No abnormal hypermetabolism in the neck, chest, abdomen or pelvis.   10/05/2014 - 03/22/2015 Chemotherapy   RVD   10/28/2014 Treatment Plan Change   Issues related to getting Revlimid in a timely fashion, therefore, she received her Revlimid on 9/21 resulting in a 12 day cycle this time instead of a 14 day cycle   11/02/2014 Imaging   CTA chest- No evidence for a large or central pulmonary embolism as described.  8 mm density along the right minor fissure could represent focal pleural thickening but indeterminate. If the patient is at high risk for bronchogenic carcinoma, follow-up c   11/23/2014 Miscellaneous   Zometa 4 mg IV monthly   04/07/2015 Bone Marrow Biopsy   Normocellular marrow with 2-4% clonal plasma cells by immunohistochemistry. FISH and cytogenetics were normal [facility])     04/2015 Miscellaneous   PRETRANSPLANT EVALUATION:  Pulmonary function tests: FEV1 100.3% / DLCO 97.9%  Echocardiogram: Normal LV function with EF 60-65%    04/27/2015 Procedure   Stem cell mobilization with filgrastim and Mozobil [facility])   05/06/2015 Miscellaneous   BMT conditioning regimen with high-dose  Melphalan given [facility], [name]); Day -1   05/07/2015 Bone Marrow Transplant   Outpatient autologous stem cell transplant [facility], [name]); Day 0   05/18/2015 Miscellaneous   WBC engraftment;  did not require platelet transfusion during her transplant process. Lowest platelet count 28,000    05/20/2015 Procedure   Tunneled catheter removed [facility])   09/08/2015 -  Chemotherapy   Velcade every 2 weeks   12/15/2015 Miscellaneous   Zometa re-instituted.    12/23/2015 Imaging   Bone density- BMD as determined from Forearm Radius 33% is 0.799 g/cm2 with a T-Score of 1.2. This patient is considered normal according to [facility]) criteria.   04/17/2016 Miscellaneous   Started maintenance with Ninlaro.   05/04/2016 Treatment Plan Change   Zometa switched to Xgeva injection monthly given difficult IV access.    03/05/2019 -  Chemotherapy   The patient had carfilzomib (KYPROLIS) 40 mg in dextrose 5 % 50 mL chemo infusion, 18 mg/m2 = 44 mg, Intravenous,  Once, 1 of 4 cycles Dose modification: 56 mg/m2 (original dose 56 mg/m2, Cycle 1, Reason: Provider Judgment) Administration: 40 mg (03/05/2019), 120 mg (03/12/2019), 120 mg (03/20/2019)  for chemotherapy treatment.       CANCER STAGING: Cancer Staging Multiple myeloma [facility]) Staging form: Multiple Myeloma, AJCC 6th Edition - Clinical stage from 10/26/2014: Stage IIA - Signed by Baird Cancer, PA-C on 10/26/2014 - Pathologic: No stage assigned - Unsigned    INTERVAL HISTORY:  Kelli Hurley 64 y.o. female seen for follow-up of multiple myeloma and  further treatment and toxicity assessment.  Appetite is 100%.  Energy levels are 50%.  No new onset pains reported.  After her increased dose of carfilzomib last week, she did not experience any signs of PND or orthopnea.  No worsening of lower extremity swelling.  Numbness in the right hand has been stable.  Shortness of breath on exertion is also stable.   REVIEW OF SYSTEMS:  Review of Systems  Respiratory: Positive for shortness of breath.   Musculoskeletal: Positive for arthralgias.  Neurological: Positive for numbness.  All other systems reviewed and are negative.    PAST MEDICAL/SURGICAL HISTORY:  Past Medical History:  Diagnosis Date  . Anemia   . Arthritis   . Cancer ([place])   . Claustrophobia 10/05/2014  . Hypertension   . Leukopenia 08/03/2014  . Normocytic hypochromic anemia 08/03/2014  . Renal disorder    stage 3    Past Surgical History:  Procedure Laterality Date  . ABDOMINAL HYSTERECTOMY    . CARDIAC SURGERY    . OTHER SURGICAL HISTORY     heart surgery as infant to "repair hole in heart"  . PORTACATH PLACEMENT Left 02/21/2019   Procedure: INSERTION PORT-A-CATH;  Surgeon: Virl Cagey, MD;  Location: [facility];  Service: General;  Laterality: Left;     SOCIAL HISTORY:  Social History   Socioeconomic History  . Marital status: Legally Separated    Spouse name: Not on file  . Number of children: Not on file  . Years of education: Not on file  . Highest education level: Not on file  Occupational History  . Not on file  Tobacco Use  . Smoking status: Never Smoker  . Smokeless tobacco: Never Used  Substance and Sexual Activity  . Alcohol use: No  . Drug use: No  . Sexual activity: Not on file    Comment: divorced- 2 daughters  Other Topics Concern  . Not on file  Social History Narrative  . Not on file   Social Determinants of Health   Financial Resource Strain:   . Difficulty of Paying Living Expenses: Not on file  Food Insecurity:   . Worried About Charity fundraiser in the Last Year: Not on file  . Ran Out of Food in the Last Year: Not on file  Transportation Needs:   . Lack of Transportation (Medical): Not on file  . Lack of Transportation (Non-Medical): Not on file  Physical Activity:   . Days of Exercise per Week: Not on file  . Minutes of Exercise per Session: Not on file  Stress:   .  Feeling of Stress : Not on file  Social Connections:   . Frequency of Communication with Friends and Family: Not on file  . Frequency of Social Gatherings with Friends and Family: Not on file  . Attends Religious Services: Not on file  . Active Member of Clubs or Organizations: Not on file  . Attends Archivist Meetings: Not on file  . Marital Status: Not on file  Intimate Partner Violence:   . Fear of Current or Ex-Partner: Not on file  . Emotionally Abused: Not on file  . Physically Abused: Not on file  . Sexually Abused: Not on file    FAMILY HISTORY:  Family History  Problem Relation Age of Onset  . Cancer Mother   . Hypertension Mother   . Cancer Father   . Hypertension Father   . Cancer Maternal Grandmother   . Hypertension Sister   .  Hypertension Brother     CURRENT MEDICATIONS:  Outpatient Encounter Medications as of 03/20/2019  Medication Sig  . acetaminophen (TYLENOL 8 HOUR ARTHRITIS PAIN) 650 MG CR tablet Take 650 mg by mouth every 8 (eight) hours as needed for pain.  Marland Kitchen acyclovir (ZOVIRAX) 400 MG tablet Take 1 tablet (400 mg total) by mouth 2 (two) times daily.  Marland Kitchen albuterol (VENTOLIN HFA) 108 (90 Base) MCG/ACT inhaler Inhale 2 puffs into the lungs every 6 (six) hours as needed for wheezing or shortness of breath. (Patient not taking: Reported on 02/26/2019)  . Ascorbic Acid (VITAMIN C) 1000 MG tablet Take 1,000 mg by mouth daily.   Marland Kitchen aspirin EC 81 MG tablet Take 81 mg by mouth daily.  Marland Kitchen CARFILZOMIB IV Inject into the vein.  . cholecalciferol (VITAMIN D) 1000 units tablet Take 1,000 Units by mouth daily.  Marland Kitchen dexamethasone (DECADRON) 4 MG tablet Take 10 tablets (40 mg total) by mouth once a week. On the week you are off of treatment  . gabapentin (NEURONTIN) 300 MG capsule Take 1 capsule (300 mg total) by mouth 3 (three) times daily. (Patient taking differently: Take 300 mg by mouth 2 (two) times daily. )  . hydrochlorothiazide (HYDRODIURIL) 25 MG tablet TAKE 1  TABLET BY MOUTH ONCE DAILY. (Patient taking differently: Take 25 mg by mouth daily. )  . ibuprofen (ADVIL) 400 MG tablet Take 1 tablet (400 mg total) by mouth every 8 (eight) hours as needed. (Patient not taking: Reported on 03/05/2019)  . lidocaine-prilocaine (EMLA) cream Apply a quarter sized glob 1 hour prior to treatment.  . Multiple Vitamin (TAB-A-VITE) TABS TAKE 1 TABLET BY MOUTH ONCE DAILY. (Patient taking differently: Take 1 tablet by mouth daily. )  . pomalidomide (POMALYST) 4 MG capsule Take 1 capsule (4 mg total) by mouth daily.  . prochlorperazine (COMPAZINE) 10 MG tablet Take 1 tablet (10 mg total) by mouth every 6 (six) hours as needed for nausea or vomiting. (Patient not taking: Reported on 02/13/2019)  . trolamine salicylate (ASPERCREME) 10 % cream Apply 1 application topically 2 (two) times daily.   Facility-Administered Encounter Medications as of 03/20/2019  Medication  . heparin lock flush 100 unit/mL  . sodium chloride 0.9 % injection 10 mL    ALLERGIES:  Allergies  Allergen Reactions  . Diclofenac Swelling    Per pt, facial swelling     PHYSICAL EXAM:  ECOG Performance status: 1  Vitals:   03/20/19 0752  BP: (!) 105/55  Pulse: 78  Resp: 20  Temp: (!) 97.1 F (36.2 C)  SpO2: 98%   Filed Weights   03/20/19 0752  Weight: (!) 347 lb 6.4 oz (157.6 kg)    Physical Exam Vitals reviewed.  Constitutional:      Appearance: Normal appearance.  Cardiovascular:     Rate and Rhythm: Normal rate and regular rhythm.     Heart sounds: Normal heart sounds.  Pulmonary:     Effort: Pulmonary effort is normal.     Breath sounds: Normal breath sounds.  Abdominal:     General: There is no distension.     Palpations: Abdomen is soft. There is no mass.  Musculoskeletal:        General: No swelling.  Skin:    General: Skin is warm.  Neurological:     General: No focal deficit present.     Mental Status: She is alert and oriented to person, place, and time.   Psychiatric:        Mood  and Affect: Mood normal.        Behavior: Behavior normal.      LABORATORY DATA:  I have reviewed the labs as listed.  CBC    Component Value Date/Time   WBC 4.0 03/20/2019 0749   RBC 3.39 (L) 03/20/2019 0749   HGB 10.2 (L) 03/20/2019 0749   HCT 32.0 (L) 03/20/2019 0749   PLT 101 (L) 03/20/2019 0749   MCV 94.4 03/20/2019 0749   MCH 30.1 03/20/2019 0749   MCHC 31.9 03/20/2019 0749   RDW 14.1 03/20/2019 0749   LYMPHSABS 0.7 03/20/2019 0749   MONOABS 0.7 03/20/2019 0749   EOSABS 0.1 03/20/2019 0749   BASOSABS 0.0 03/20/2019 0749   CMP Latest Ref Rng & Units 03/20/2019 03/12/2019 03/05/2019  Glucose 70 - 99 mg/dL 106(H) 103(H) 101(H)  BUN 8 - 23 mg/dL 22 26(H) 22  Creatinine 0.44 - 1.00 mg/dL 0.94 1.04(H) 1.03(H)  Sodium 135 - 145 mmol/L 136 138 138  Potassium 3.5 - 5.1 mmol/L 3.7 3.7 3.8  Chloride 98 - 111 mmol/L 104 101 101  CO2 22 - 32 mmol/L 28 27 28   Calcium 8.9 - 10.3 mg/dL 8.7(L) 9.9 9.6  Total Protein 6.5 - 8.1 g/dL 7.1 7.8 7.9  Total Bilirubin 0.3 - 1.2 mg/dL 0.7 0.6 0.5  Alkaline Phos 38 - 126 U/L 46 50 51  AST 15 - 41 U/L 16 21 18   ALT 0 - 44 U/L 17 16 16        DIAGNOSTIC IMAGING:  I have independently reviewed the scans and discussed with the patient.   I have reviewed Venita Lick LPN's note and agree with the documentation.  I personally performed a face-to-face visit, made revisions and my assessment and plan is as follows.    ASSESSMENT & PLAN:   Multiple myeloma ([place]) 1.  Relapsed IgG kappa myeloma with high risk features: -Cycle 1 of carfilzomib on 03/05/2019.  She received higher dose of carfilzomib on 03/12/2019. -Pomalyst 4 mg 3 weeks on 1 week off started on 03/08/2019. -She did not experience any shortness of breath or fluid retention. -I have reviewed her labs.  CBC showed mild anemia and thrombocytopenia consistent with treatment. -Creatinine, calcium and LFTs were normal.  We will proceed with day 8 of carfilzomib  at 56 mg per metered squared. -Next week will be her off week.  We will see her back in 2 weeks for follow-up.  2.  ID prophylaxis: -She will continue acyclovir 400 mg twice daily.  She will also continue 81 mg aspirin daily.  3.  Neuropathy: -She will continue gabapentin 300 mg twice daily.  Numbness in the right hand fingertips stable.  4.  Shortness of breath on exertion: -2D echo on 03/09/2019 shows EF 60-65%.  No left ventricular hypertrophy. -CT chest PE protocol on 02/24/2019 shows no PE. -Cardiac MRI from 03/07/2019 shows LVEF 56%.  No amyloid seen. -Troponin T, proBNP negative.   Orders placed this encounter:  Orders Placed This Encounter  Procedures  . CBC with Differential/Platelet  . Comprehensive metabolic panel  . Protein electrophoresis, serum  . Kappa/lambda light chains  . Lactate dehydrogenase      Derek Jack, MD [place] [phone]

## 2019-03-20 NOTE — Progress Notes (Signed)
Patient has been assessed, vital signs and labs have been reviewed by Dr. Katragadda. ANC, Creatinine, LFTs, and Platelets are within treatment parameters per Dr. Katragadda. The patient is good to proceed with treatment at this time.  

## 2019-03-28 MED ORDER — POMALIDOMIDE 4 MG PO CAPS: 4.0000 mg | ORAL_CAPSULE | Freq: Every day | ORAL | 0 refills | Status: DC

## 2019-04-02 ENCOUNTER — Inpatient Hospital Stay (HOSPITAL_COMMUNITY): Payer: Medicare Other

## 2019-04-02 ENCOUNTER — Other Ambulatory Visit: Payer: Self-pay

## 2019-04-02 ENCOUNTER — Encounter (HOSPITAL_COMMUNITY): Payer: Self-pay | Admitting: Hematology

## 2019-04-02 ENCOUNTER — Inpatient Hospital Stay (HOSPITAL_BASED_OUTPATIENT_CLINIC_OR_DEPARTMENT_OTHER): Payer: Medicare Other | Admitting: Hematology

## 2019-04-02 VITALS — BP 123/69 | HR 75 | Resp 20

## 2019-04-02 DIAGNOSIS — Z5112 Encounter for antineoplastic immunotherapy: Secondary | ICD-10-CM | POA: Diagnosis not present

## 2019-04-02 DIAGNOSIS — C9000 Multiple myeloma not having achieved remission: Secondary | ICD-10-CM

## 2019-04-02 LAB — CBC WITH DIFFERENTIAL/PLATELET
Abs Immature Granulocytes: 0.01 10*3/uL (ref 0.00–0.07)
Basophils Absolute: 0 10*3/uL (ref 0.0–0.1)
Basophils Relative: 1 %
Eosinophils Absolute: 0.1 10*3/uL (ref 0.0–0.5)
Eosinophils Relative: 3 %
HCT: 34.1 % — ABNORMAL LOW (ref 36.0–46.0)
Hemoglobin: 10.9 g/dL — ABNORMAL LOW (ref 12.0–15.0)
Immature Granulocytes: 0 %
Lymphocytes Relative: 36 %
Lymphs Abs: 1.2 10*3/uL (ref 0.7–4.0)
MCH: 30.4 pg (ref 26.0–34.0)
MCHC: 32 g/dL (ref 30.0–36.0)
MCV: 95 fL (ref 80.0–100.0)
Monocytes Absolute: 0.7 10*3/uL (ref 0.1–1.0)
Monocytes Relative: 21 %
Neutro Abs: 1.3 10*3/uL — ABNORMAL LOW (ref 1.7–7.7)
Neutrophils Relative %: 39 %
Platelets: 242 10*3/uL (ref 150–400)
RBC: 3.59 MIL/uL — ABNORMAL LOW (ref 3.87–5.11)
RDW: 14.3 % (ref 11.5–15.5)
WBC: 3.2 10*3/uL — ABNORMAL LOW (ref 4.0–10.5)
nRBC: 0 % (ref 0.0–0.2)

## 2019-04-02 LAB — COMPREHENSIVE METABOLIC PANEL
ALT: 16 U/L (ref 0–44)
AST: 17 U/L (ref 15–41)
Albumin: 3.8 g/dL (ref 3.5–5.0)
Alkaline Phosphatase: 51 U/L (ref 38–126)
Anion gap: 8 (ref 5–15)
BUN: 18 mg/dL (ref 8–23)
CO2: 30 mmol/L (ref 22–32)
Calcium: 9.2 mg/dL (ref 8.9–10.3)
Chloride: 102 mmol/L (ref 98–111)
Creatinine, Ser: 0.99 mg/dL (ref 0.44–1.00)
GFR calc Af Amer: >60 mL/min (ref >60)
GFR calc non Af Amer: >60 mL/min (ref >60)
Glucose, Bld: 103 mg/dL — ABNORMAL HIGH (ref 70–99)
Potassium: 3.8 mmol/L (ref 3.5–5.1)
Sodium: 140 mmol/L (ref 135–145)
Total Bilirubin: 0.4 mg/dL (ref 0.3–1.2)
Total Protein: 6.9 g/dL (ref 6.5–8.1)

## 2019-04-02 LAB — LACTATE DEHYDROGENASE: LDH: 182 U/L (ref 98–192)

## 2019-04-02 MED ORDER — DEXTROSE 5 % IV SOLN
54.5000 mg/m2 | Freq: Once | INTRAVENOUS | Status: AC
  Administered 2019-04-02: 120 mg via INTRAVENOUS
  Filled 2019-04-02: qty 60

## 2019-04-02 MED ORDER — SODIUM CHLORIDE 0.9 % IV SOLN
40.0000 mg | Freq: Once | INTRAVENOUS | Status: AC
  Administered 2019-04-02: 40 mg via INTRAVENOUS
  Filled 2019-04-02: qty 4

## 2019-04-02 MED ORDER — SODIUM CHLORIDE 0.9 % IV SOLN: Freq: Once | INTRAVENOUS | Status: AC

## 2019-04-02 MED ORDER — HEPARIN SOD (PORK) LOCK FLUSH 100 UNIT/ML IV SOLN
500.0000 [IU] | Freq: Once | INTRAVENOUS | Status: AC | PRN
  Administered 2019-04-02: 500 [IU]

## 2019-04-02 MED ORDER — SODIUM CHLORIDE 0.9% FLUSH
10.0000 mL | INTRAVENOUS | Status: DC | PRN
  Administered 2019-04-02: 11:00:00 10 mL

## 2019-04-02 NOTE — Patient Instructions (Signed)
[place] at [facility] Discharge Instructions  You were seen today by Dr. Delton Coombes. He went over your recent lab results. He will see you back in 2 weeks for labs treatment, and follow up.   Thank you for choosing [place] at [facility] to provide your oncology and hematology care.  To afford each patient quality time with our provider, please arrive at least 15 minutes before your scheduled appointment time.   If you have a lab appointment with the [facility] please come in thru the  Main Entrance and check in at the main information desk  You need to re-schedule your appointment should you arrive 10 or more minutes late.  We strive to give you quality time with our providers, and arriving late affects you and other patients whose appointments are after yours.  Also, if you no show three or more times for appointments you may be dismissed from the clinic at the providers discretion.     Again, thank you for choosing [facility].  Our hope is that these requests will decrease the amount of time that you wait before being seen by our physicians.       _____________________________________________________________  Should you have questions after your visit to [facility], please contact our office at [phone] between the hours of 8:00 a.m. and 4:30 p.m.  Voicemails left after 4:00 p.m. will not be returned until the following business day.  For prescription refill requests, have your pharmacy contact our office and allow 72 hours.    Cancer Center Support Programs:   > Cancer Support Group  2nd Tuesday of the month 1pm-2pm, Journey Room

## 2019-04-02 NOTE — Patient Instructions (Signed)
[facility] Discharge Instructions for Patients Receiving Chemotherapy   Beginning January 23rd 2017 lab work for the Cancer Center will be done in the  Main lab at  on 1st floor. If you have a lab appointment with the Cancer Center please come in thru the  Main Entrance and check in at the main information desk   Today you received the following chemotherapy agents Kyprolis  To help prevent nausea and vomiting after your treatment, we encourage you to take your nausea medication If you develop nausea and vomiting, or diarrhea that is not controlled by your medication, call the clinic.  The clinic phone number is [phone]. Office hours are Monday-Friday 8:30am-5:00pm.  BELOW ARE SYMPTOMS THAT SHOULD BE REPORTED IMMEDIATELY:  *FEVER GREATER THAN 101.0 F  *CHILLS WITH OR WITHOUT FEVER  NAUSEA AND VOMITING THAT IS NOT CONTROLLED WITH YOUR NAUSEA MEDICATION  *UNUSUAL SHORTNESS OF BREATH  *UNUSUAL BRUISING OR BLEEDING  TENDERNESS IN MOUTH AND THROAT WITH OR WITHOUT PRESENCE OF ULCERS  *URINARY PROBLEMS  *BOWEL PROBLEMS  UNUSUAL RASH Items with * indicate a potential emergency and should be followed up as soon as possible. If you have an emergency after office hours please contact your primary care physician or go to the nearest emergency department.  Please call the clinic during office hours if you have any questions or concerns.   You may also contact the Patient Navigator at [phone] should you have any questions or need assistance in obtaining follow up care.      Resources For Cancer Patients and their Caregivers ? American Cancer Society: Can assist with transportation, wigs, general needs, runs Look Good Feel Better.        1-888-227-6333 ? Cancer Care: Provides financial assistance, online support groups, medication/co-pay assistance.  1-800-813-HOPE (4673) ? [facility] Assists [place] cancer  patients and their families through emotional , educational and financial support.  336-427-4357 ? [name] Where to apply for food stamps, Medicaid and utility assistance. 336-342-1394 ? [name]: Transportation to medical appointments. 336-347-2287 ? Social Security Administration: May apply for disability if have a Stage IV cancer. 336-342-7796 1-800-772-1213 ? [place] Aging, Disability and Transit Services: Assists with nutrition, care and transit needs. 336-349-2343          

## 2019-04-02 NOTE — Assessment & Plan Note (Signed)
1.  Relapsed IgG kappa multiple myeloma with high risk features: -Cycle 1 of carfilzomib and pomalidomide on 03/05/2019. -She felt better during her week off last week.  She did take 40 mg of dexamethasone last week. -I have reviewed her labs which showed low white count of 3.2 with ANC of 1300.  Platelets were normal.  LFTs and creatinine were normal. -She will start her cycle 2 carfilzomib today.  She will also start pomalidomide 4 mg 3 weeks on 1 week off later tonight. -We will recheck her blood counts in 2 weeks and reevaluate her.  She will continue treatment next week.  2.  Shortness of breath on exertion: -2D echo on 03/09/2019 showed EF 60-65% with no left ventricular hypertrophy.  CT chest PE protocol on 02/24/2019 shows no PE. -Cardiac MRI from 03/07/2019 shows LVEF 56%.  No amyloid seen. -Troponin T and proBNP were negative. -Shortness of breath on exertion improved after first cycle.  3.  ID prophylaxis: -He will continue acyclovir 400 mg twice daily.  She will continue aspirin 81 mg daily.  4.  Neuropathy: -She will continue gabapentin 300 mg twice daily.  Numbness in the right hand fingertips is stable.  5.  Lower extremity edema: -She has 1+ edema in the legs.  She is currently taking HCTZ 25 mg daily. -If it gets any worse, we will start her on Lasix 20 mg daily as needed.

## 2019-04-02 NOTE — Progress Notes (Signed)
1110- Patient seen by Dr. Delton Coombes and lab results reviewed, including ANC 1.3. Per MD, ok to proceed with treatment today.

## 2019-04-02 NOTE — Progress Notes (Signed)
Patient has been assessed, vital signs and labs have been reviewed by Dr. Katragadda. ANC, Creatinine, LFTs, and Platelets are within treatment parameters per Dr. Katragadda. The patient is good to proceed with treatment at this time.  

## 2019-04-02 NOTE — Progress Notes (Signed)
[place], [place] [address]   CLINIC:  Medical Oncology/Hematology  PCP:  Abran Richard, MD [address] [place] [address] [phone]   REASON FOR VISIT:  Follow-up for multiple myeloma   BRIEF ONCOLOGIC HISTORY:  Oncology History  Multiple myeloma ([place])  08/07/2014 Imaging   Bone Survey- No lytic lesions are noted in the visualized skeleton.   09/03/2014 Bone Marrow Biopsy   NORMOCELLULAR BONE MARROW FOR AGE WITH PLASMA CELL NEOPLASM.  The plasma cell component is increased in the marrow representing an estimated 18% of all cells. Cytogenetics with 13q-, 17p- (high risk disease)   09/03/2014 Pathology Results   Cytogenetics with 13q-, 17p- (high risk disease)   09/23/2014 Initial Diagnosis   Multiple myeloma   09/30/2014 PET scan   No abnormal hypermetabolism in the neck, chest, abdomen or pelvis.   10/05/2014 - 03/22/2015 Chemotherapy   RVD   10/28/2014 Treatment Plan Change   Issues related to getting Revlimid in a timely fashion, therefore, she received her Revlimid on 9/21 resulting in a 12 day cycle this time instead of a 14 day cycle   11/02/2014 Imaging   CTA chest- No evidence for a large or central pulmonary embolism as described.  8 mm density along the right minor fissure could represent focal pleural thickening but indeterminate. If the patient is at high risk for bronchogenic carcinoma, follow-up c   11/23/2014 Miscellaneous   Zometa 4 mg IV monthly   04/07/2015 Bone Marrow Biopsy   Normocellular marrow with 2-4% clonal plasma cells by immunohistochemistry. FISH and cytogenetics were normal [facility])     04/2015 Miscellaneous   PRETRANSPLANT EVALUATION:  Pulmonary function tests: FEV1 100.3% / DLCO 97.9%  Echocardiogram: Normal LV function with EF 60-65%    04/27/2015 Procedure   Stem cell mobilization with filgrastim and Mozobil [facility])   05/06/2015 Miscellaneous   BMT conditioning regimen with high-dose  Melphalan given [facility], [name]); Day -1   05/07/2015 Bone Marrow Transplant   Outpatient autologous stem cell transplant [facility], [name]); Day 0   05/18/2015 Miscellaneous   WBC engraftment;  did not require platelet transfusion during her transplant process. Lowest platelet count 28,000    05/20/2015 Procedure   Tunneled catheter removed [facility])   09/08/2015 -  Chemotherapy   Velcade every 2 weeks   12/15/2015 Miscellaneous   Zometa re-instituted.    12/23/2015 Imaging   Bone density- BMD as determined from Forearm Radius 33% is 0.799 g/cm2 with a T-Score of 1.2. This patient is considered normal according to [place] [facility]) criteria.   04/17/2016 Miscellaneous   Started maintenance with N[place]o.   05/04/2016 Treatment Plan Change   Zometa switched to Xgeva injection monthly given difficult IV access.    03/05/2019 -  Chemotherapy   The patient had carfilzomib (KYPROLIS) 40 mg in dextrose 5 % 50 mL chemo infusion, 18 mg/m2 = 44 mg, Intravenous,  Once, 2 of 4 cycles Dose modification: 56 mg/m2 (original dose 56 mg/m2, Cycle 1, Reason: Provider Judgment) Administration: 40 mg (03/05/2019), 120 mg (03/12/2019), 120 mg (03/20/2019)  for chemotherapy treatment.       CANCER STAGING: Cancer Staging Multiple myeloma [facility]) Staging form: Multiple Myeloma, AJCC 6th Edition - Clinical stage from 10/26/2014: Stage IIA - Signed by Baird Cancer, PA-C on 10/26/2014 - Pathologic: No stage assigned - Unsigned    INTERVAL HISTORY:  Kelli Hurley 64 y.o. female seen for follow-up of multiple myeloma and  toxicity management prior to cycle 2 of treatment.  Appetite is 100%.  Energy levels are 50%.  Last week was her treatment off week.  She took dexamethasone.  She reported improvement in energy levels last week.  Pain in the knees has been stable.  Numbness in the right hand is also stable.  Shortness of breath is slightly better.  Denies any fevers or  chills.  REVIEW OF SYSTEMS:  Review of Systems  Respiratory: Positive for shortness of breath.   Musculoskeletal: Positive for arthralgias.  Neurological: Positive for numbness.  All other systems reviewed and are negative.    PAST MEDICAL/SURGICAL HISTORY:  Past Medical History:  Diagnosis Date  . Anemia   . Arthritis   . Cancer ([place])   . Claustrophobia 10/05/2014  . Hypertension   . Leukopenia 08/03/2014  . Normocytic hypochromic anemia 08/03/2014  . Renal disorder    stage 3    Past Surgical History:  Procedure Laterality Date  . ABDOMINAL HYSTERECTOMY    . CARDIAC SURGERY    . OTHER SURGICAL HISTORY     heart surgery as infant to "repair hole in heart"  . PORTACATH PLACEMENT Left 02/21/2019   Procedure: INSERTION PORT-A-CATH;  Surgeon: Virl Cagey, MD;  Location: [facility];  Service: General;  Laterality: Left;     SOCIAL HISTORY:  Social History   Socioeconomic History  . Marital status: Legally Separated    Spouse name: Not on file  . Number of children: Not on file  . Years of education: Not on file  . Highest education level: Not on file  Occupational History  . Not on file  Tobacco Use  . Smoking status: Never Smoker  . Smokeless tobacco: Never Used  Substance and Sexual Activity  . Alcohol use: No  . Drug use: No  . Sexual activity: Not on file    Comment: divorced- 2 daughters  Other Topics Concern  . Not on file  Social History Narrative  . Not on file   Social Determinants of Health   Financial Resource Strain:   . Difficulty of Paying Living Expenses: Not on file  Food Insecurity:   . Worried About Charity fundraiser in the Last Year: Not on file  . Ran Out of Food in the Last Year: Not on file  Transportation Needs:   . Lack of Transportation (Medical): Not on file  . Lack of Transportation (Non-Medical): Not on file  Physical Activity:   . Days of Exercise per Week: Not on file  . Minutes of Exercise per Session: Not on file   Stress:   . Feeling of Stress : Not on file  Social Connections:   . Frequency of Communication with Friends and Family: Not on file  . Frequency of Social Gatherings with Friends and Family: Not on file  . Attends Religious Services: Not on file  . Active Member of Clubs or Organizations: Not on file  . Attends Archivist Meetings: Not on file  . Marital Status: Not on file  Intimate Partner Violence:   . Fear of Current or Ex-Partner: Not on file  . Emotionally Abused: Not on file  . Physically Abused: Not on file  . Sexually Abused: Not on file    FAMILY HISTORY:  Family History  Problem Relation Age of Onset  . Cancer Mother   . Hypertension Mother   . Cancer Father   . Hypertension Father   . Cancer Maternal Grandmother   .  Hypertension Sister   . Hypertension Brother     CURRENT MEDICATIONS:  Outpatient Encounter Medications as of 04/02/2019  Medication Sig  . acyclovir (ZOVIRAX) 400 MG tablet Take 1 tablet (400 mg total) by mouth 2 (two) times daily.  . Ascorbic Acid (VITAMIN C) 1000 MG tablet Take 1,000 mg by mouth daily.   Marland Kitchen aspirin EC 81 MG tablet Take 81 mg by mouth daily.  Marland Kitchen CARFILZOMIB IV Inject into the vein.  . cholecalciferol (VITAMIN D) 1000 units tablet Take 1,000 Units by mouth daily.  Marland Kitchen dexamethasone (DECADRON) 4 MG tablet Take 10 tablets (40 mg total) by mouth once a week. On the week you are off of treatment  . gabapentin (NEURONTIN) 300 MG capsule Take 1 capsule (300 mg total) by mouth 3 (three) times daily. (Patient taking differently: Take 300 mg by mouth 2 (two) times daily. )  . hydrochlorothiazide (HYDRODIURIL) 25 MG tablet TAKE 1 TABLET BY MOUTH ONCE DAILY. (Patient taking differently: Take 25 mg by mouth daily. )  . lidocaine-prilocaine (EMLA) cream Apply a quarter sized glob 1 hour prior to treatment.  . Multiple Vitamin (TAB-A-VITE) TABS TAKE 1 TABLET BY MOUTH ONCE DAILY. (Patient taking differently: Take 1 tablet by mouth daily. )   . pomalidomide (POMALYST) 4 MG capsule Take 1 capsule (4 mg total) by mouth daily.  Marland Kitchen trolamine salicylate (ASPERCREME) 10 % cream Apply 1 application topically 2 (two) times daily.  Marland Kitchen acetaminophen (TYLENOL 8 HOUR ARTHRITIS PAIN) 650 MG CR tablet Take 650 mg by mouth every 8 (eight) hours as needed for pain.  Marland Kitchen albuterol (VENTOLIN HFA) 108 (90 Base) MCG/ACT inhaler Inhale 2 puffs into the lungs every 6 (six) hours as needed for wheezing or shortness of breath. (Patient not taking: Reported on 02/26/2019)  . ibuprofen (ADVIL) 400 MG tablet Take 1 tablet (400 mg total) by mouth every 8 (eight) hours as needed. (Patient not taking: Reported on 03/05/2019)  . prochlorperazine (COMPAZINE) 10 MG tablet Take 1 tablet (10 mg total) by mouth every 6 (six) hours as needed for nausea or vomiting. (Patient not taking: Reported on 02/13/2019)   Facility-Administered Encounter Medications as of 04/02/2019  Medication  . heparin lock flush 100 unit/mL  . sodium chloride 0.9 % injection 10 mL    ALLERGIES:  Allergies  Allergen Reactions  . Diclofenac Swelling    Per pt, facial swelling     PHYSICAL EXAM:  ECOG Performance status: 1  Vitals:   04/02/19 0946  BP: 130/85  Pulse: 77  Resp: 20  Temp: (!) 97.1 F (36.2 C)  SpO2: 99%   Filed Weights   04/02/19 0946  Weight: (!) 352 lb 3.2 oz (159.8 kg)    Physical Exam Vitals reviewed.  Constitutional:      Appearance: Normal appearance.  Cardiovascular:     Rate and Rhythm: Normal rate and regular rhythm.     Heart sounds: Normal heart sounds.  Pulmonary:     Effort: Pulmonary effort is normal.     Breath sounds: Normal breath sounds.  Abdominal:     General: There is no distension.     Palpations: Abdomen is soft. There is no mass.  Musculoskeletal:        General: No swelling.  Skin:    General: Skin is warm.  Neurological:     General: No focal deficit present.     Mental Status: She is alert and oriented to person, place, and  time.  Psychiatric:  Mood and Affect: Mood normal.        Behavior: Behavior normal.      LABORATORY DATA:  I have reviewed the labs as listed.  CBC    Component Value Date/Time   WBC 3.2 (L) 04/02/2019 1021   RBC 3.59 (L) 04/02/2019 1021   HGB 10.9 (L) 04/02/2019 1021   HCT 34.1 (L) 04/02/2019 1021   PLT 242 04/02/2019 1021   MCV 95.0 04/02/2019 1021   MCH 30.4 04/02/2019 1021   MCHC 32.0 04/02/2019 1021   RDW 14.3 04/02/2019 1021   LYMPHSABS 1.2 04/02/2019 1021   MONOABS 0.7 04/02/2019 1021   EOSABS 0.1 04/02/2019 1021   BASOSABS 0.0 04/02/2019 1021   CMP Latest Ref Rng & Units 04/02/2019 03/20/2019 03/12/2019  Glucose 70 - 99 mg/dL 103(H) 106(H) 103(H)  BUN 8 - 23 mg/dL 18 22 26(H)  Creatinine 0.44 - 1.00 mg/dL 0.99 0.94 1.04(H)  Sodium 135 - 145 mmol/L 140 136 138  Potassium 3.5 - 5.1 mmol/L 3.8 3.7 3.7  Chloride 98 - 111 mmol/L 102 104 101  CO2 22 - 32 mmol/L 30 28 27   Calcium 8.9 - 10.3 mg/dL 9.2 8.7(L) 9.9  Total Protein 6.5 - 8.1 g/dL 6.9 7.1 7.8  Total Bilirubin 0.3 - 1.2 mg/dL 0.4 0.7 0.6  Alkaline Phos 38 - 126 U/L 51 46 50  AST 15 - 41 U/L 17 16 21   ALT 0 - 44 U/L 16 17 16        DIAGNOSTIC IMAGING:  I have independently reviewed scans and discussed with the patient.   I have reviewed Venita Lick LPN's note and agree with the documentation.  I personally performed a face-to-face visit, made revisions and my assessment and plan is as follows.    ASSESSMENT & PLAN:   Multiple myeloma ([place]) 1.  Relapsed IgG kappa multiple myeloma with high risk features: -Cycle 1 of carfilzomib and pomalidomide on 03/05/2019. -She felt better during her week off last week.  She did take 40 mg of dexamethasone last week. -I have reviewed her labs which showed low white count of 3.2 with ANC of 1300.  Platelets were normal.  LFTs and creatinine were normal. -She will start her cycle 2 carfilzomib today.  She will also start pomalidomide 4 mg 3 weeks on 1 week off  later tonight. -We will recheck her blood counts in 2 weeks and reevaluate her.  She will continue treatment next week.  2.  Shortness of breath on exertion: -2D echo on 03/09/2019 showed EF 60-65% with no left ventricular hypertrophy.  CT chest PE protocol on 02/24/2019 shows no PE. -Cardiac MRI from 03/07/2019 shows LVEF 56%.  No amyloid seen. -Troponin T and proBNP were negative. -Shortness of breath on exertion improved after first cycle.  3.  ID prophylaxis: -He will continue acyclovir 400 mg twice daily.  She will continue aspirin 81 mg daily.  4.  Neuropathy: -She will continue gabapentin 300 mg twice daily.  Numbness in the right hand fingertips is stable.  5.  Lower extremity edema: -She has 1+ edema in the legs.  She is currently taking HCTZ 25 mg daily. -If it gets any worse, we will start her on Lasix 20 mg daily as needed.   Orders placed this encounter:  No orders of the defined types were placed in this encounter.     Derek Jack, MD [place] [phone]

## 2019-04-03 LAB — PROTEIN ELECTROPHORESIS, SERUM
A/G Ratio: 1.3 (ref 0.7–1.7)
Albumin ELP: 3.7 g/dL (ref 2.9–4.4)
Alpha-1-Globulin: 0.2 g/dL (ref 0.0–0.4)
Alpha-2-Globulin: 0.6 g/dL (ref 0.4–1.0)
Beta Globulin: 0.9 g/dL (ref 0.7–1.3)
Gamma Globulin: 1.2 g/dL (ref 0.4–1.8)
Globulin, Total: 2.9 g/dL (ref 2.2–3.9)
M-Spike, %: 0.6 g/dL — ABNORMAL HIGH
Total Protein ELP: 6.6 g/dL (ref 6.0–8.5)

## 2019-04-03 LAB — KAPPA/LAMBDA LIGHT CHAINS
Kappa free light chain: 23.7 mg/L — ABNORMAL HIGH (ref 3.3–19.4)
Kappa, lambda light chain ratio: 1.69 — ABNORMAL HIGH (ref 0.26–1.65)
Lambda free light chains: 14 mg/L (ref 5.7–26.3)

## 2019-04-09 ENCOUNTER — Inpatient Hospital Stay (HOSPITAL_COMMUNITY): Payer: Medicare Other | Attending: Hematology

## 2019-04-09 ENCOUNTER — Inpatient Hospital Stay (HOSPITAL_COMMUNITY): Payer: Medicare Other

## 2019-04-09 ENCOUNTER — Encounter (HOSPITAL_COMMUNITY): Payer: Self-pay

## 2019-04-09 ENCOUNTER — Ambulatory Visit (HOSPITAL_COMMUNITY): Payer: Medicare Other | Admitting: Hematology

## 2019-04-09 ENCOUNTER — Other Ambulatory Visit: Payer: Self-pay

## 2019-04-09 VITALS — BP 140/82 | HR 88 | Temp 97.1°F | Resp 18

## 2019-04-09 DIAGNOSIS — C9002 Multiple myeloma in relapse: Secondary | ICD-10-CM | POA: Diagnosis present

## 2019-04-09 DIAGNOSIS — G629 Polyneuropathy, unspecified: Secondary | ICD-10-CM | POA: Diagnosis not present

## 2019-04-09 DIAGNOSIS — Z8249 Family history of ischemic heart disease and other diseases of the circulatory system: Secondary | ICD-10-CM | POA: Insufficient documentation

## 2019-04-09 DIAGNOSIS — C9000 Multiple myeloma not having achieved remission: Secondary | ICD-10-CM

## 2019-04-09 DIAGNOSIS — Z809 Family history of malignant neoplasm, unspecified: Secondary | ICD-10-CM | POA: Diagnosis not present

## 2019-04-09 DIAGNOSIS — G8929 Other chronic pain: Secondary | ICD-10-CM | POA: Diagnosis not present

## 2019-04-09 DIAGNOSIS — R200 Anesthesia of skin: Secondary | ICD-10-CM | POA: Diagnosis not present

## 2019-04-09 DIAGNOSIS — M1990 Unspecified osteoarthritis, unspecified site: Secondary | ICD-10-CM | POA: Diagnosis not present

## 2019-04-09 DIAGNOSIS — R0602 Shortness of breath: Secondary | ICD-10-CM | POA: Insufficient documentation

## 2019-04-09 DIAGNOSIS — M2550 Pain in unspecified joint: Secondary | ICD-10-CM | POA: Insufficient documentation

## 2019-04-09 DIAGNOSIS — Z5112 Encounter for antineoplastic immunotherapy: Secondary | ICD-10-CM | POA: Insufficient documentation

## 2019-04-09 DIAGNOSIS — Z79899 Other long term (current) drug therapy: Secondary | ICD-10-CM | POA: Diagnosis not present

## 2019-04-09 DIAGNOSIS — Z9221 Personal history of antineoplastic chemotherapy: Secondary | ICD-10-CM | POA: Diagnosis not present

## 2019-04-09 DIAGNOSIS — R600 Localized edema: Secondary | ICD-10-CM | POA: Diagnosis not present

## 2019-04-09 LAB — CBC WITH DIFFERENTIAL/PLATELET
Abs Immature Granulocytes: 0.19 10*3/uL — ABNORMAL HIGH (ref 0.00–0.07)
Basophils Absolute: 0 10*3/uL (ref 0.0–0.1)
Basophils Relative: 0 %
Eosinophils Absolute: 0.1 10*3/uL (ref 0.0–0.5)
Eosinophils Relative: 2 %
HCT: 34.7 % — ABNORMAL LOW (ref 36.0–46.0)
Hemoglobin: 11.3 g/dL — ABNORMAL LOW (ref 12.0–15.0)
Immature Granulocytes: 4 %
Lymphocytes Relative: 11 %
Lymphs Abs: 0.5 10*3/uL — ABNORMAL LOW (ref 0.7–4.0)
MCH: 30.6 pg (ref 26.0–34.0)
MCHC: 32.6 g/dL (ref 30.0–36.0)
MCV: 94 fL (ref 80.0–100.0)
Monocytes Absolute: 0.3 10*3/uL (ref 0.1–1.0)
Monocytes Relative: 7 %
Neutro Abs: 3.6 10*3/uL (ref 1.7–7.7)
Neutrophils Relative %: 76 %
Platelets: 113 10*3/uL — ABNORMAL LOW (ref 150–400)
RBC: 3.69 MIL/uL — ABNORMAL LOW (ref 3.87–5.11)
RDW: 14.2 % (ref 11.5–15.5)
WBC: 4.7 10*3/uL (ref 4.0–10.5)
nRBC: 0 % (ref 0.0–0.2)

## 2019-04-09 LAB — COMPREHENSIVE METABOLIC PANEL
ALT: 20 U/L (ref 0–44)
AST: 18 U/L (ref 15–41)
Albumin: 3.8 g/dL (ref 3.5–5.0)
Alkaline Phosphatase: 49 U/L (ref 38–126)
Anion gap: 10 (ref 5–15)
BUN: 20 mg/dL (ref 8–23)
CO2: 29 mmol/L (ref 22–32)
Calcium: 9.3 mg/dL (ref 8.9–10.3)
Chloride: 99 mmol/L (ref 98–111)
Creatinine, Ser: 0.92 mg/dL (ref 0.44–1.00)
GFR calc Af Amer: >60 mL/min (ref >60)
GFR calc non Af Amer: >60 mL/min (ref >60)
Glucose, Bld: 105 mg/dL — ABNORMAL HIGH (ref 70–99)
Potassium: 3.6 mmol/L (ref 3.5–5.1)
Sodium: 138 mmol/L (ref 135–145)
Total Bilirubin: 0.6 mg/dL (ref 0.3–1.2)
Total Protein: 7.3 g/dL (ref 6.5–8.1)

## 2019-04-09 LAB — LACTATE DEHYDROGENASE: LDH: 252 U/L — ABNORMAL HIGH (ref 98–192)

## 2019-04-09 LAB — MAGNESIUM: Magnesium: 1.7 mg/dL (ref 1.7–2.4)

## 2019-04-09 MED ORDER — SODIUM CHLORIDE 0.9 % IV SOLN
40.0000 mg | Freq: Once | INTRAVENOUS | Status: AC
  Administered 2019-04-09: 40 mg via INTRAVENOUS
  Filled 2019-04-09: qty 4

## 2019-04-09 MED ORDER — HEPARIN SOD (PORK) LOCK FLUSH 100 UNIT/ML IV SOLN
500.0000 [IU] | Freq: Once | INTRAVENOUS | Status: AC | PRN
  Administered 2019-04-09: 13:00:00 500 [IU]

## 2019-04-09 MED ORDER — SODIUM CHLORIDE 0.9 % IV SOLN: Freq: Once | INTRAVENOUS | Status: AC

## 2019-04-09 MED ORDER — SODIUM CHLORIDE 0.9% FLUSH
10.0000 mL | INTRAVENOUS | Status: DC | PRN
  Administered 2019-04-09: 10 mL

## 2019-04-09 MED ORDER — DEXTROSE 5 % IV SOLN
54.5000 mg/m2 | Freq: Once | INTRAVENOUS | Status: AC
  Administered 2019-04-09: 120 mg via INTRAVENOUS
  Filled 2019-04-09: qty 60

## 2019-04-09 NOTE — Patient Instructions (Signed)
[facility] Discharge Instructions for Patients Receiving Chemotherapy   Beginning January 23rd 2017 lab work for the Cancer Center will be done in the  Main lab at [place] on 1st floor. If you have a lab appointment with the Cancer Center please come in thru the  Main Entrance and check in at the main information desk   Today you received the following chemotherapy agents Kyprolis. Follow-up as scheduled. Call clinic for any questions or concerns  To help prevent nausea and vomiting after your treatment, we encourage you to take your nausea medication   If you develop nausea and vomiting, or diarrhea that is not controlled by your medication, call the clinic.  The clinic phone number is [phone]. Office hours are Monday-Friday 8:30am-5:00pm.  BELOW ARE SYMPTOMS THAT SHOULD BE REPORTED IMMEDIATELY:  *FEVER GREATER THAN 101.0 F  *CHILLS WITH OR WITHOUT FEVER  NAUSEA AND VOMITING THAT IS NOT CONTROLLED WITH YOUR NAUSEA MEDICATION  *UNUSUAL SHORTNESS OF BREATH  *UNUSUAL BRUISING OR BLEEDING  TENDERNESS IN MOUTH AND THROAT WITH OR WITHOUT PRESENCE OF ULCERS  *URINARY PROBLEMS  *BOWEL PROBLEMS  UNUSUAL RASH Items with * indicate a potential emergency and should be followed up as soon as possible. If you have an emergency after office hours please contact your primary care physician or go to the nearest emergency department.  Please call the clinic during office hours if you have any questions or concerns.   You may also contact the Patient Navigator at [phone] should you have any questions or need assistance in obtaining follow up care.      Resources For Cancer Patients and their Caregivers ? American Cancer Society: Can assist with transportation, wigs, general needs, runs Look Good Feel Better.        1-888-227-6333 ? Cancer Care: Provides financial assistance, online support groups, medication/co-pay assistance.  1-800-813-HOPE  (4673) ? [facility] Assists [place] cancer patients and their families through emotional , educational and financial support.  336-427-4357 ? [name] Where to apply for food stamps, Medicaid and utility assistance. 336-342-1394 ? [name]: Transportation to medical appointments. 336-347-2287 ? Social Security Administration: May apply for disability if have a Stage IV cancer. 336-342-7796 1-800-772-1213 ? [place] Aging, Disability and Transit Services: Assists with nutrition, care and transit needs. 336-349-2343         

## 2019-04-09 NOTE — Progress Notes (Signed)
Kelli Hurley tolerated Kyprolis infusion well without complaints or incident. Labs reviewed prior to administering this medication. Pt continues to take her Pomalyst as prescribed without issues. VSS upon discharge. Pt discharged via wheelchair in satisfactory condition accompanied by family member

## 2019-04-09 NOTE — Patient Instructions (Signed)
[facility] at [facility] Discharge Instructions  Labs drawn from portacath today   Thank you for choosing [facility] at [facility] to provide your oncology and hematology care.  To afford each patient quality time with our provider, please arrive at least 15 minutes before your scheduled appointment time.   If you have a lab appointment with the Cancer Center please come in thru the Main Entrance and check in at the main information desk.  You need to re-schedule your appointment should you arrive 10 or more minutes late.  We strive to give you quality time with our providers, and arriving late affects you and other patients whose appointments are after yours.  Also, if you no show three or more times for appointments you may be dismissed from the clinic at the providers discretion.     Again, thank you for choosing [facility].  Our hope is that these requests will decrease the amount of time that you wait before being seen by our physicians.       _____________________________________________________________  Should you have questions after your visit to  Cancer Center, please contact our office at [phone] between the hours of 8:00 a.m. and 4:30 p.m.  Voicemails left after 4:00 p.m. will not be returned until the following business day.  For prescription refill requests, have your pharmacy contact our office and allow 72 hours.    Due to Covid, you will need to wear a mask upon entering the hospital. If you do not have a mask, a mask will be given to you at the Main Entrance upon arrival. For doctor visits, patients may have 1 support person with them. For treatment visits, patients can not have anyone with them due to social distancing guidelines and our immunocompromised population.     

## 2019-04-16 ENCOUNTER — Inpatient Hospital Stay (HOSPITAL_COMMUNITY): Payer: Medicare Other

## 2019-04-16 ENCOUNTER — Inpatient Hospital Stay (HOSPITAL_BASED_OUTPATIENT_CLINIC_OR_DEPARTMENT_OTHER): Payer: Medicare Other | Admitting: Hematology

## 2019-04-16 ENCOUNTER — Encounter (HOSPITAL_COMMUNITY): Payer: Self-pay

## 2019-04-16 ENCOUNTER — Other Ambulatory Visit: Payer: Self-pay

## 2019-04-16 VITALS — BP 114/62 | HR 77 | Resp 16

## 2019-04-16 DIAGNOSIS — Z5112 Encounter for antineoplastic immunotherapy: Secondary | ICD-10-CM | POA: Diagnosis not present

## 2019-04-16 DIAGNOSIS — E876 Hypokalemia: Secondary | ICD-10-CM

## 2019-04-16 DIAGNOSIS — C9000 Multiple myeloma not having achieved remission: Secondary | ICD-10-CM

## 2019-04-16 LAB — CBC WITH DIFFERENTIAL/PLATELET
Abs Immature Granulocytes: 0.07 10*3/uL (ref 0.00–0.07)
Basophils Absolute: 0 10*3/uL (ref 0.0–0.1)
Basophils Relative: 0 %
Eosinophils Absolute: 0.1 10*3/uL (ref 0.0–0.5)
Eosinophils Relative: 2 %
HCT: 32.5 % — ABNORMAL LOW (ref 36.0–46.0)
Hemoglobin: 10.5 g/dL — ABNORMAL LOW (ref 12.0–15.0)
Immature Granulocytes: 1 %
Lymphocytes Relative: 20 %
Lymphs Abs: 1.2 10*3/uL (ref 0.7–4.0)
MCH: 30.7 pg (ref 26.0–34.0)
MCHC: 32.3 g/dL (ref 30.0–36.0)
MCV: 95 fL (ref 80.0–100.0)
Monocytes Absolute: 0.5 10*3/uL (ref 0.1–1.0)
Monocytes Relative: 9 %
Neutro Abs: 4 10*3/uL (ref 1.7–7.7)
Neutrophils Relative %: 68 %
Platelets: 93 10*3/uL — ABNORMAL LOW (ref 150–400)
RBC: 3.42 MIL/uL — ABNORMAL LOW (ref 3.87–5.11)
RDW: 14 % (ref 11.5–15.5)
WBC: 5.9 10*3/uL (ref 4.0–10.5)
nRBC: 0 % (ref 0.0–0.2)

## 2019-04-16 LAB — COMPREHENSIVE METABOLIC PANEL
ALT: 19 U/L (ref 0–44)
AST: 17 U/L (ref 15–41)
Albumin: 3.4 g/dL — ABNORMAL LOW (ref 3.5–5.0)
Alkaline Phosphatase: 48 U/L (ref 38–126)
Anion gap: 10 (ref 5–15)
BUN: 23 mg/dL (ref 8–23)
CO2: 26 mmol/L (ref 22–32)
Calcium: 8.7 mg/dL — ABNORMAL LOW (ref 8.9–10.3)
Chloride: 99 mmol/L (ref 98–111)
Creatinine, Ser: 1.34 mg/dL — ABNORMAL HIGH (ref 0.44–1.00)
GFR calc Af Amer: 49 mL/min — ABNORMAL LOW (ref >60)
GFR calc non Af Amer: 42 mL/min — ABNORMAL LOW (ref >60)
Glucose, Bld: 141 mg/dL — ABNORMAL HIGH (ref 70–99)
Potassium: 3.4 mmol/L — ABNORMAL LOW (ref 3.5–5.1)
Sodium: 135 mmol/L (ref 135–145)
Total Bilirubin: 0.8 mg/dL (ref 0.3–1.2)
Total Protein: 6.9 g/dL (ref 6.5–8.1)

## 2019-04-16 LAB — MAGNESIUM: Magnesium: 1.5 mg/dL — ABNORMAL LOW (ref 1.7–2.4)

## 2019-04-16 MED ORDER — POTASSIUM CHLORIDE CRYS ER 20 MEQ PO TBCR
20.0000 meq | EXTENDED_RELEASE_TABLET | Freq: Once | ORAL | Status: AC
  Administered 2019-04-16: 20 meq via ORAL
  Filled 2019-04-16: qty 1

## 2019-04-16 MED ORDER — SODIUM CHLORIDE 0.9 % IV SOLN
40.0000 mg | Freq: Once | INTRAVENOUS | Status: AC
  Administered 2019-04-16: 40 mg via INTRAVENOUS
  Filled 2019-04-16: qty 4

## 2019-04-16 MED ORDER — SODIUM CHLORIDE 0.9 % IV SOLN: Freq: Once | INTRAVENOUS | Status: AC

## 2019-04-16 MED ORDER — DEXTROSE 5 % IV SOLN
54.5000 mg/m2 | Freq: Once | INTRAVENOUS | Status: AC
  Administered 2019-04-16: 120 mg via INTRAVENOUS
  Filled 2019-04-16: qty 60

## 2019-04-16 MED ORDER — MAGNESIUM SULFATE 2 GM/50ML IV SOLN
2.0000 g | Freq: Once | INTRAVENOUS | Status: AC
  Administered 2019-04-16: 2 g via INTRAVENOUS
  Filled 2019-04-16: qty 50

## 2019-04-16 MED ORDER — SODIUM CHLORIDE 0.9% FLUSH
10.0000 mL | INTRAVENOUS | Status: DC | PRN
  Administered 2019-04-16: 10 mL

## 2019-04-16 MED ORDER — HEPARIN SOD (PORK) LOCK FLUSH 100 UNIT/ML IV SOLN
500.0000 [IU] | Freq: Once | INTRAVENOUS | Status: AC | PRN
  Administered 2019-04-16: 14:00:00 500 [IU]

## 2019-04-16 NOTE — Patient Instructions (Addendum)
[name] at [facility] Discharge Instructions  You were seen today by Dr. Delton Coombes. He went over your recent lab results. He will recheck your labs in 1 week. He will see you back in 3 weeks for labs, treatment and follow up. Please use the albuterol inhaler twice a day to help with breathing.   Thank you for choosing [place] at [facility] to provide your oncology and hematology care.  To afford each patient quality time with our provider, please arrive at least 15 minutes before your scheduled appointment time.   If you have a lab appointment with the [facility] please come in thru the  Main Entrance and check in at the main information desk  You need to re-schedule your appointment should you arrive 10 or more minutes late.  We strive to give you quality time with our providers, and arriving late affects you and other patients whose appointments are after yours.  Also, if you no show three or more times for appointments you may be dismissed from the clinic at the providers discretion.     Again, thank you for choosing [facility].  Our hope is that these requests will decrease the amount of time that you wait before being seen by our physicians.       _____________________________________________________________  Should you have questions after your visit to [facility], please contact our office at [phone] between the hours of 8:00 a.m. and 4:30 p.m.  Voicemails left after 4:00 p.m. will not be returned until the following business day.  For prescription refill requests, have your pharmacy contact our office and allow 72 hours.    Cancer Center Support Programs:   > Cancer Support Group  2nd Tuesday of the month 1pm-2pm, Journey Room

## 2019-04-16 NOTE — Progress Notes (Signed)
04/16/19  Ok to proceed with Platelets 93K  T.O. Dr Alphonzo Severance Higinio Roger

## 2019-04-16 NOTE — Progress Notes (Signed)
Patient has been assessed, vital signs and labs have been reviewed by Dr. Delton Coombes. ANC,  LFTs, and Platelets are within treatment parameters, magnesium is low as well as potassium, please give 2 grams of magnesium IV and 20 mEq potassium PO per Dr. Delton Coombes. The patient is good to proceed with treatment at this time.

## 2019-04-16 NOTE — Assessment & Plan Note (Signed)
1.  Relapsed IgG kappa multiple myeloma with high-risk features: -Two cycles of carfilzomib, pomalidomide and dexamethasone on 03/05/2019 and 04/02/2019. -We reviewed labs from 04/02/2019.  M spike improved to 0.6 g from 1 g on 01/27/2019.  Free kappa light chains improved to 23.7 from 42 previously.  Ratio improved to 1.69 from 2.69. -She is tolerating Pomalyst 4 mg 3 weeks on 1 week off very well. -We reviewed her labs today.  Creatinine increased to 1.3.  Her baseline is 0.9.  Likely from carfilzomib.  She is receiving Pyridamal of normal saline. -We will check her creatinine next week.  We will see her back in 2 weeks for follow-up.  2.  Shortness of breath on exertion: -2D echo on 02/09/2019 shows EF 60-65% with no LVH.  CT chest PE protocol on 02/24/2019 -. -Cardiac MRI from 03/07/2019 shows LVEF 56% with no amyloid.  Troponin T and proBNP were negative. -Shortness of breath improved slightly after first cycle.  However she reported some shortness of breath on exertion. -Lungs are clear to auscultation.  She was told to use albuterol inhaler twice daily.  3.  ID prophylaxis: -She will continue 400 mg acyclovir twice daily.  She will continue aspirin 81 mg daily for thromboprophylaxis.  4.  Neuropathy: -She will continue gabapentin 300 mg three times a day.  5.  Lower extremity edema: -She has trace edema in the legs.  She will continue on HCTZ 25 mg daily. -If there is any worsening, will consider Lasix 20 mg daily.

## 2019-04-16 NOTE — Progress Notes (Signed)
[place], [place] [address]   CLINIC:  Medical Oncology/Hematology  PCP:  Abran Richard, MD [address] [place] [address] [phone]   REASON FOR VISIT:  Follow-up for multiple myeloma   BRIEF ONCOLOGIC HISTORY:  Oncology History  Multiple myeloma ([place])  08/07/2014 Imaging   Bone Survey- No lytic lesions are noted in the visualized skeleton.   09/03/2014 Bone Marrow Biopsy   NORMOCELLULAR BONE MARROW FOR AGE WITH PLASMA CELL NEOPLASM.  The plasma cell component is increased in the marrow representing an estimated 18% of all cells. Cytogenetics with 13q-, 17p- (high risk disease)   09/03/2014 Pathology Results   Cytogenetics with 13q-, 17p- (high risk disease)   09/23/2014 Initial Diagnosis   Multiple myeloma   09/30/2014 PET scan   No abnormal hypermetabolism in the neck, chest, abdomen or pelvis.   10/05/2014 - 03/22/2015 Chemotherapy   RVD   10/28/2014 Treatment Plan Change   Issues related to getting Revlimid in a timely fashion, therefore, she received her Revlimid on 9/21 resulting in a 12 day cycle this time instead of a 14 day cycle   11/02/2014 Imaging   CTA chest- No evidence for a large or central pulmonary embolism as described.  8 mm density along the right minor fissure could represent focal pleural thickening but indeterminate. If the patient is at high risk for bronchogenic carcinoma, follow-up c   11/23/2014 Miscellaneous   Zometa 4 mg IV monthly   04/07/2015 Bone Marrow Biopsy   Normocellular marrow with 2-4% clonal plasma cells by immunohistochemistry. FISH and cytogenetics were normal [facility])     04/2015 Miscellaneous   PRETRANSPLANT EVALUATION:  Pulmonary function tests: FEV1 100.3% / DLCO 97.9%  Echocardiogram: Normal LV function with EF 60-65%    04/27/2015 Procedure   Stem cell mobilization with filgrastim and Mozobil [facility])   05/06/2015 Miscellaneous   BMT conditioning regimen with high-dose  Melphalan given [name]); Day -1   05/07/2015 Bone Marrow Transplant   Outpatient autologous stem cell transplant [facility], [name]); Day 0   05/18/2015 Miscellaneous   WBC engraftment;  did not require platelet transfusion during her transplant process. Lowest platelet count 28,000    05/20/2015 Procedure   Tunneled catheter removed [facility])   09/08/2015 -  Chemotherapy   Velcade every 2 weeks   12/15/2015 Miscellaneous   Zometa re-instituted.    12/23/2015 Imaging   Bone density- BMD as determined from [facility] 33% is 0.799 g/cm2 with a T-Score of 1.2. This patient is considered normal according to [facility]) criteria.   04/17/2016 Miscellaneous   Started maintenance with Ninlaro.   05/04/2016 Treatment Plan Change   Zometa switched to Xgeva injection monthly given difficult IV access.    03/05/2019 -  Chemotherapy   The patient had carfilzomib (KYPROLIS) 40 mg in dextrose 5 % 50 mL chemo infusion, 18 mg/m2 = 44 mg, Intravenous,  Once, 2 of 4 cycles Dose modification: 56 mg/m2 (original dose 56 mg/m2, Cycle 1, Reason: Provider Judgment) Administration: 40 mg (03/05/2019), 120 mg (03/12/2019), 120 mg (03/20/2019), 120 mg (04/02/2019), 120 mg (04/09/2019), 120 mg (04/16/2019)  for chemotherapy treatment.       CANCER STAGING: Cancer Staging Multiple myeloma [facility]) Staging form: Multiple Myeloma, AJCC 6th Edition - Clinical stage from 10/26/2014: Stage IIA - Signed by Baird Cancer, PA-C on 10/26/2014 - Pathologic: No stage assigned - Unsigned    INTERVAL HISTORY:  [name] 64  y.o. female seen for follow-up of multiple myeloma and toxicity assessment prior to her next treatment today.  She is here for day 15 of cycle two.  Appetite is 100%.  Reports some shortness of breath on exertion.  Energy levels are 50%.  Pain in the knees has been stable.  Neuropathy with numbness in the right hand is also stable.  She is continuing  gabapentin.  Denies any cough.  REVIEW OF SYSTEMS:  Review of Systems  Respiratory: Positive for shortness of breath.   Musculoskeletal: Positive for arthralgias.  Neurological: Positive for numbness.  All other systems reviewed and are negative.    PAST MEDICAL/SURGICAL HISTORY:  Past Medical History:  Diagnosis Date  . Anemia   . Arthritis   . Cancer ([place])   . Claustrophobia 10/05/2014  . Hypertension   . Leukopenia 08/03/2014  . Normocytic hypochromic anemia 08/03/2014  . Renal disorder    stage 3    Past Surgical History:  Procedure Laterality Date  . ABDOMINAL HYSTERECTOMY    . CARDIAC SURGERY    . OTHER SURGICAL HISTORY     heart surgery as infant to "repair hole in heart"  . PORTACATH PLACEMENT Left 02/21/2019   Procedure: INSERTION PORT-A-CATH;  Surgeon: Virl Cagey, MD;  Location: [facility];  Service: General;  Laterality: Left;     SOCIAL HISTORY:  Social History   Socioeconomic History  . Marital status: Legally Separated    Spouse name: Not on file  . Number of children: Not on file  . Years of education: Not on file  . Highest education level: Not on file  Occupational History  . Not on file  Tobacco Use  . Smoking status: Never Smoker  . Smokeless tobacco: Never Used  Substance and Sexual Activity  . Alcohol use: No  . Drug use: No  . Sexual activity: Not on file    Comment: divorced- 2 daughters  Other Topics Concern  . Not on file  Social History Narrative  . Not on file   Social Determinants of Health   Financial Resource Strain:   . Difficulty of Paying Living Expenses: Not on file  Food Insecurity:   . Worried About Charity fundraiser in the Last Year: Not on file  . Ran Out of Food in the Last Year: Not on file  Transportation Needs:   . Lack of Transportation (Medical): Not on file  . Lack of Transportation (Non-Medical): Not on file  Physical Activity:   . Days of Exercise per Week: Not on file  . Minutes of Exercise per  Session: Not on file  Stress:   . Feeling of Stress : Not on file  Social Connections:   . Frequency of Communication with Friends and Family: Not on file  . Frequency of Social Gatherings with Friends and Family: Not on file  . Attends Religious Services: Not on file  . Active Member of Clubs or Organizations: Not on file  . Attends Archivist Meetings: Not on file  . Marital Status: Not on file  Intimate Partner Violence:   . Fear of Current or Ex-Partner: Not on file  . Emotionally Abused: Not on file  . Physically Abused: Not on file  . Sexually Abused: Not on file    FAMILY HISTORY:  Family History  Problem Relation Age of Onset  . Cancer Mother   . Hypertension Mother   . Cancer Father   . Hypertension Father   . Cancer Maternal  Grandmother   . Hypertension Sister   . Hypertension Brother     CURRENT MEDICATIONS:  Outpatient Encounter Medications as of 04/16/2019  Medication Sig  . acyclovir (ZOVIRAX) 400 MG tablet Take 1 tablet (400 mg total) by mouth 2 (two) times daily.  . Ascorbic Acid (VITAMIN C) 1000 MG tablet Take 1,000 mg by mouth daily.   Marland Kitchen aspirin EC 81 MG tablet Take 81 mg by mouth daily.  Marland Kitchen CARFILZOMIB IV Inject into the vein.  . cholecalciferol (VITAMIN D) 1000 units tablet Take 1,000 Units by mouth daily.  Marland Kitchen dexamethasone (DECADRON) 4 MG tablet Take 10 tablets (40 mg total) by mouth once a week. On the week you are off of treatment  . gabapentin (NEURONTIN) 300 MG capsule Take 1 capsule (300 mg total) by mouth 3 (three) times daily. (Patient taking differently: Take 300 mg by mouth 2 (two) times daily. )  . hydrochlorothiazide (HYDRODIURIL) 25 MG tablet TAKE 1 TABLET BY MOUTH ONCE DAILY. (Patient taking differently: Take 25 mg by mouth daily. )  . lidocaine-prilocaine (EMLA) cream Apply a quarter sized glob 1 hour prior to treatment.  . Multiple Vitamin (TAB-A-VITE) TABS TAKE 1 TABLET BY MOUTH ONCE DAILY. (Patient taking differently: Take 1 tablet  by mouth daily. )  . pomalidomide (POMALYST) 4 MG capsule Take 1 capsule (4 mg total) by mouth daily.  Marland Kitchen trolamine salicylate (ASPERCREME) 10 % cream Apply 1 application topically 2 (two) times daily.  Marland Kitchen acetaminophen (TYLENOL 8 HOUR ARTHRITIS PAIN) 650 MG CR tablet Take 650 mg by mouth every 8 (eight) hours as needed for pain.  Marland Kitchen albuterol (VENTOLIN HFA) 108 (90 Base) MCG/ACT inhaler Inhale 2 puffs into the lungs every 6 (six) hours as needed for wheezing or shortness of breath. (Patient not taking: Reported on 02/26/2019)  . ibuprofen (ADVIL) 400 MG tablet Take 1 tablet (400 mg total) by mouth every 8 (eight) hours as needed. (Patient not taking: Reported on 03/05/2019)  . prochlorperazine (COMPAZINE) 10 MG tablet Take 1 tablet (10 mg total) by mouth every 6 (six) hours as needed for nausea or vomiting. (Patient not taking: Reported on 02/13/2019)   Facility-Administered Encounter Medications as of 04/16/2019  Medication  . heparin lock flush 100 unit/mL  . sodium chloride 0.9 % injection 10 mL    ALLERGIES:  Allergies  Allergen Reactions  . Diclofenac Swelling    Per pt, facial swelling     PHYSICAL EXAM:  ECOG Performance status: 1  Vitals:   04/16/19 0956  BP: 102/69  Pulse: 98  Temp: (!) 97.3 F (36.3 C)  SpO2: 96%   Filed Weights   04/16/19 0956  Weight: (!) 341 lb 1.6 oz (154.7 kg)    Physical Exam Vitals reviewed.  Constitutional:      Appearance: Normal appearance.  Cardiovascular:     Rate and Rhythm: Normal rate and regular rhythm.     Heart sounds: Normal heart sounds.  Pulmonary:     Effort: Pulmonary effort is normal.     Breath sounds: Normal breath sounds.  Abdominal:     General: There is no distension.     Palpations: Abdomen is soft. There is no mass.  Musculoskeletal:        General: No swelling.  Skin:    General: Skin is warm.  Neurological:     General: No focal deficit present.     Mental Status: She is alert and oriented to person, place,  and time.  Psychiatric:  Mood and Affect: Mood normal.        Behavior: Behavior normal.      LABORATORY DATA:  I have reviewed the labs as listed.  CBC    Component Value Date/Time   WBC 5.9 04/16/2019 0944   RBC 3.42 (L) 04/16/2019 0944   HGB 10.5 (L) 04/16/2019 0944   HCT 32.5 (L) 04/16/2019 0944   PLT 93 (L) 04/16/2019 0944   MCV 95.0 04/16/2019 0944   MCH 30.7 04/16/2019 0944   MCHC 32.3 04/16/2019 0944   RDW 14.0 04/16/2019 0944   LYMPHSABS 1.2 04/16/2019 0944   MONOABS 0.5 04/16/2019 0944   EOSABS 0.1 04/16/2019 0944   BASOSABS 0.0 04/16/2019 0944   CMP Latest Ref Rng & Units 04/16/2019 04/09/2019 04/02/2019  Glucose 70 - 99 mg/dL 141(H) 105(H) 103(H)  BUN 8 - 23 mg/dL _0 Creatinine 0.44 - 1.00 mg/dL 1.34(H) 0.92 0.99  Sodium 135 - 145 mmol/L 135 138 140  Potassium 3.5 - 5.1 mmol/L 3.4(L) 3.6 3.8  Chloride 98 - 111 mmol/L 99 99 102  CO2 22 - 32 mmol/L _1 Calcium 8.9 - 10.3 mg/dL 8.7(L) 9.3 9.2  Total Protein 6.5 - 8.1 g/dL 6.9 7.3 6.9  Total Bilirubin 0.3 - 1.2 mg/dL 0.8 0.6 0.4  Alkaline Phos 38 - 126 U/L 48 49 51  AST 15 - 41 U/L _2 ALT 0 - 44 U/L _3 DIAGNOSTIC IMAGING:  I have independently reviewed scans and discussed with the patient.   I have reviewed Venita Lick LPN's note and agree with the documentation.  I personally performed a face-to-face visit, made revisions and my assessment and plan is as follows.    ASSESSMENT & PLAN:   Multiple myeloma ([place]) 1.  Relapsed IgG kappa multiple myeloma with high-risk features: -Two cycles of carfilzomib, pomalidomide and dexamethasone on 03/05/2019 and 04/02/2019. -We reviewed labs from 04/02/2019.  M spike improved to 0.6 g from 1 g on 01/27/2019.  Free kappa light chains improved to 23.7 from 42 previously.  Ratio improved to 1.69 from 2.69. -She is tolerating Pomalyst 4 mg 3 weeks on 1 week off very well. -We reviewed her labs today.  Creatinine increased to 1.3.   Her baseline is 0.9.  Likely from carfilzomib.  She is receiving Pyridamal of normal saline. -We will check her creatinine next week.  We will see her back in 2 weeks for follow-up.  2.  Shortness of breath on exertion: -2D echo on 02/09/2019 shows EF 60-65% with no LVH.  CT chest PE protocol on 02/24/2019 -. -Cardiac MRI from 03/07/2019 shows LVEF 56% with no amyloid.  Troponin T and proBNP were negative. -Shortness of breath improved slightly after first cycle.  However she reported some shortness of breath on exertion. -Lungs are clear to auscultation.  She was told to use albuterol inhaler twice daily.  3.  ID prophylaxis: -She will continue 400 mg acyclovir twice daily.  She will continue aspirin 81 mg daily for thromboprophylaxis.  4.  Neuropathy: -She will continue gabapentin 300 mg three times a day.  5.  Lower extremity edema: -She has trace edema in the legs.  She will continue on HCTZ 25 mg daily. -If there is any worsening, will consider Lasix 20 mg daily.   Orders placed this encounter:  No orders of the defined types were placed in this encounter.     Derek Jack, MD Deneise Lever  [facility] [phone]    

## 2019-04-16 NOTE — Progress Notes (Signed)
Labs reviewed with MD today. Magnesium is 1.5, K 3.4  Will give additional fluids with electrolytes per orders. Proceed with treatment as well.   Treatment given per orders. Patient tolerated it well without problems. Vitals stable and discharged home from clinic via wheelchair. Follow up as scheduled.

## 2019-04-16 NOTE — Patient Instructions (Signed)
[facility] Discharge Instructions for Patients Receiving Chemotherapy  Today you received the following chemotherapy agents   To help prevent nausea and vomiting after your treatment, we encourage you to take your nausea medication   If you develop nausea and vomiting that is not controlled by your nausea medication, call the clinic.   BELOW ARE SYMPTOMS THAT SHOULD BE REPORTED IMMEDIATELY:  *FEVER GREATER THAN 100.5 F  *CHILLS WITH OR WITHOUT FEVER  NAUSEA AND VOMITING THAT IS NOT CONTROLLED WITH YOUR NAUSEA MEDICATION  *UNUSUAL SHORTNESS OF BREATH  *UNUSUAL BRUISING OR BLEEDING  TENDERNESS IN MOUTH AND THROAT WITH OR WITHOUT PRESENCE OF ULCERS  *URINARY PROBLEMS  *BOWEL PROBLEMS  UNUSUAL RASH Items with * indicate a potential emergency and should be followed up as soon as possible.  Feel free to call the clinic should you have any questions or concerns. The clinic phone number is [phone].  Please show the CHEMO ALERT CARD at check-in to the Emergency Department and triage nurse.   

## 2019-04-23 ENCOUNTER — Other Ambulatory Visit: Payer: Self-pay

## 2019-04-23 ENCOUNTER — Inpatient Hospital Stay (HOSPITAL_COMMUNITY): Payer: Medicare Other

## 2019-04-23 DIAGNOSIS — C9000 Multiple myeloma not having achieved remission: Secondary | ICD-10-CM

## 2019-04-23 DIAGNOSIS — Z5112 Encounter for antineoplastic immunotherapy: Secondary | ICD-10-CM | POA: Diagnosis not present

## 2019-04-23 LAB — COMPREHENSIVE METABOLIC PANEL
ALT: 17 U/L (ref 0–44)
AST: 16 U/L (ref 15–41)
Albumin: 3.5 g/dL (ref 3.5–5.0)
Alkaline Phosphatase: 49 U/L (ref 38–126)
Anion gap: 11 (ref 5–15)
BUN: 26 mg/dL — ABNORMAL HIGH (ref 8–23)
CO2: 29 mmol/L (ref 22–32)
Calcium: 9.7 mg/dL (ref 8.9–10.3)
Chloride: 98 mmol/L (ref 98–111)
Creatinine, Ser: 1.2 mg/dL — ABNORMAL HIGH (ref 0.44–1.00)
GFR calc Af Amer: 56 mL/min — ABNORMAL LOW (ref >60)
GFR calc non Af Amer: 48 mL/min — ABNORMAL LOW (ref >60)
Glucose, Bld: 104 mg/dL — ABNORMAL HIGH (ref 70–99)
Potassium: 3.6 mmol/L (ref 3.5–5.1)
Sodium: 138 mmol/L (ref 135–145)
Total Bilirubin: 0.7 mg/dL (ref 0.3–1.2)
Total Protein: 6.8 g/dL (ref 6.5–8.1)

## 2019-04-23 LAB — CBC WITH DIFFERENTIAL/PLATELET
Abs Immature Granulocytes: 0.03 10*3/uL (ref 0.00–0.07)
Basophils Absolute: 0 10*3/uL (ref 0.0–0.1)
Basophils Relative: 1 %
Eosinophils Absolute: 0.2 10*3/uL (ref 0.0–0.5)
Eosinophils Relative: 7 %
HCT: 33.2 % — ABNORMAL LOW (ref 36.0–46.0)
Hemoglobin: 10.5 g/dL — ABNORMAL LOW (ref 12.0–15.0)
Immature Granulocytes: 1 %
Lymphocytes Relative: 32 %
Lymphs Abs: 1.1 10*3/uL (ref 0.7–4.0)
MCH: 29.7 pg (ref 26.0–34.0)
MCHC: 31.6 g/dL (ref 30.0–36.0)
MCV: 94.1 fL (ref 80.0–100.0)
Monocytes Absolute: 0.6 10*3/uL (ref 0.1–1.0)
Monocytes Relative: 17 %
Neutro Abs: 1.5 10*3/uL — ABNORMAL LOW (ref 1.7–7.7)
Neutrophils Relative %: 42 %
Platelets: 122 10*3/uL — ABNORMAL LOW (ref 150–400)
RBC: 3.53 MIL/uL — ABNORMAL LOW (ref 3.87–5.11)
RDW: 14 % (ref 11.5–15.5)
WBC: 3.5 10*3/uL — ABNORMAL LOW (ref 4.0–10.5)
nRBC: 0 % (ref 0.0–0.2)

## 2019-04-23 LAB — MAGNESIUM: Magnesium: 1.4 mg/dL — ABNORMAL LOW (ref 1.7–2.4)

## 2019-04-23 LAB — LACTATE DEHYDROGENASE: LDH: 217 U/L — ABNORMAL HIGH (ref 98–192)

## 2019-04-23 MED ORDER — MAGNESIUM SULFATE 2 GM/50ML IV SOLN
2.0000 g | Freq: Once | INTRAVENOUS | Status: AC
  Administered 2019-04-23: 11:00:00 2 g via INTRAVENOUS

## 2019-04-23 MED ORDER — HEPARIN SOD (PORK) LOCK FLUSH 100 UNIT/ML IV SOLN
500.0000 [IU] | Freq: Once | INTRAVENOUS | Status: AC
  Administered 2019-04-23: 500 [IU] via INTRAVENOUS

## 2019-04-23 MED ORDER — SODIUM CHLORIDE 0.9 % IV SOLN: INTRAVENOUS | Status: DC

## 2019-04-23 MED ORDER — SODIUM CHLORIDE 0.9% FLUSH
10.0000 mL | Freq: Once | INTRAVENOUS | Status: AC
  Administered 2019-04-23: 10 mL via INTRAVENOUS

## 2019-04-23 MED ORDER — MAGNESIUM SULFATE 2 GM/50ML IV SOLN
INTRAVENOUS | Status: AC
  Filled 2019-04-23: qty 50

## 2019-04-23 MED ORDER — MAGNESIUM OXIDE 400 (241.3 MG) MG PO TABS: 400.0000 mg | ORAL_TABLET | Freq: Two times a day (BID) | ORAL | 2 refills | Status: DC

## 2019-04-23 MED ORDER — POMALIDOMIDE 4 MG PO CAPS: 4.0000 mg | ORAL_CAPSULE | Freq: Every day | ORAL | 0 refills | Status: DC

## 2019-04-23 NOTE — Progress Notes (Signed)
Patient tolerated hydration with no complaints voiced.  Blood return noted with flush.  Band aid applied.  VSs with discharge and left by wheelchair with no s/s of distress noted.

## 2019-04-24 LAB — KAPPA/LAMBDA LIGHT CHAINS
Kappa free light chain: 45.5 mg/L — ABNORMAL HIGH (ref 3.3–19.4)
Kappa, lambda light chain ratio: 1.27 (ref 0.26–1.65)
Lambda free light chains: 35.7 mg/L — ABNORMAL HIGH (ref 5.7–26.3)

## 2019-04-25 LAB — PROTEIN ELECTROPHORESIS, SERUM
A/G Ratio: 1 (ref 0.7–1.7)
Albumin ELP: 3.3 g/dL (ref 2.9–4.4)
Alpha-1-Globulin: 0.3 g/dL (ref 0.0–0.4)
Alpha-2-Globulin: 0.8 g/dL (ref 0.4–1.0)
Beta Globulin: 1 g/dL (ref 0.7–1.3)
Gamma Globulin: 1.2 g/dL (ref 0.4–1.8)
Globulin, Total: 3.3 g/dL (ref 2.2–3.9)
M-Spike, %: 0.5 g/dL — ABNORMAL HIGH
Total Protein ELP: 6.6 g/dL (ref 6.0–8.5)

## 2019-04-30 ENCOUNTER — Encounter (HOSPITAL_COMMUNITY): Payer: Self-pay | Admitting: Hematology

## 2019-04-30 ENCOUNTER — Inpatient Hospital Stay (HOSPITAL_COMMUNITY): Payer: Medicare Other

## 2019-04-30 ENCOUNTER — Inpatient Hospital Stay (HOSPITAL_BASED_OUTPATIENT_CLINIC_OR_DEPARTMENT_OTHER): Payer: Medicare Other | Admitting: Hematology

## 2019-04-30 ENCOUNTER — Other Ambulatory Visit: Payer: Self-pay

## 2019-04-30 VITALS — BP 121/65 | HR 73 | Temp 98.4°F | Resp 18

## 2019-04-30 VITALS — BP 108/68 | HR 77 | Temp 97.5°F | Resp 20 | Wt 343.4 lb

## 2019-04-30 DIAGNOSIS — C9000 Multiple myeloma not having achieved remission: Secondary | ICD-10-CM | POA: Diagnosis not present

## 2019-04-30 DIAGNOSIS — Z5112 Encounter for antineoplastic immunotherapy: Secondary | ICD-10-CM | POA: Diagnosis not present

## 2019-04-30 DIAGNOSIS — R0602 Shortness of breath: Secondary | ICD-10-CM | POA: Diagnosis not present

## 2019-04-30 LAB — CBC WITH DIFFERENTIAL/PLATELET
Abs Immature Granulocytes: 0.14 10*3/uL — ABNORMAL HIGH (ref 0.00–0.07)
Basophils Absolute: 0.1 10*3/uL (ref 0.0–0.1)
Basophils Relative: 1 %
Eosinophils Absolute: 0 10*3/uL (ref 0.0–0.5)
Eosinophils Relative: 1 %
HCT: 33.1 % — ABNORMAL LOW (ref 36.0–46.0)
Hemoglobin: 10.5 g/dL — ABNORMAL LOW (ref 12.0–15.0)
Immature Granulocytes: 2 %
Lymphocytes Relative: 30 %
Lymphs Abs: 1.8 10*3/uL (ref 0.7–4.0)
MCH: 30.1 pg (ref 26.0–34.0)
MCHC: 31.7 g/dL (ref 30.0–36.0)
MCV: 94.8 fL (ref 80.0–100.0)
Monocytes Absolute: 0.8 10*3/uL (ref 0.1–1.0)
Monocytes Relative: 14 %
Neutro Abs: 3.1 10*3/uL (ref 1.7–7.7)
Neutrophils Relative %: 52 %
Platelets: 259 10*3/uL (ref 150–400)
RBC: 3.49 MIL/uL — ABNORMAL LOW (ref 3.87–5.11)
RDW: 14.6 % (ref 11.5–15.5)
WBC: 6 10*3/uL (ref 4.0–10.5)
nRBC: 0.3 % — ABNORMAL HIGH (ref 0.0–0.2)

## 2019-04-30 LAB — COMPREHENSIVE METABOLIC PANEL
ALT: 14 U/L (ref 0–44)
AST: 16 U/L (ref 15–41)
Albumin: 3.5 g/dL (ref 3.5–5.0)
Alkaline Phosphatase: 52 U/L (ref 38–126)
Anion gap: 9 (ref 5–15)
BUN: 29 mg/dL — ABNORMAL HIGH (ref 8–23)
CO2: 28 mmol/L (ref 22–32)
Calcium: 9.1 mg/dL (ref 8.9–10.3)
Chloride: 101 mmol/L (ref 98–111)
Creatinine, Ser: 1.12 mg/dL — ABNORMAL HIGH (ref 0.44–1.00)
GFR calc Af Amer: >60 mL/min (ref >60)
GFR calc non Af Amer: 53 mL/min — ABNORMAL LOW (ref >60)
Glucose, Bld: 93 mg/dL (ref 70–99)
Potassium: 3.3 mmol/L — ABNORMAL LOW (ref 3.5–5.1)
Sodium: 138 mmol/L (ref 135–145)
Total Bilirubin: 0.5 mg/dL (ref 0.3–1.2)
Total Protein: 7.2 g/dL (ref 6.5–8.1)

## 2019-04-30 MED ORDER — SODIUM CHLORIDE 0.9 % IV SOLN: Freq: Once | INTRAVENOUS | Status: AC

## 2019-04-30 MED ORDER — SODIUM CHLORIDE 0.9% FLUSH
10.0000 mL | INTRAVENOUS | Status: DC | PRN
  Administered 2019-04-30: 10 mL

## 2019-04-30 MED ORDER — HEPARIN SOD (PORK) LOCK FLUSH 100 UNIT/ML IV SOLN
500.0000 [IU] | Freq: Once | INTRAVENOUS | Status: AC | PRN
  Administered 2019-04-30: 13:00:00 500 [IU]

## 2019-04-30 MED ORDER — DEXTROSE 5 % IV SOLN
54.5000 mg/m2 | Freq: Once | INTRAVENOUS | Status: AC
  Administered 2019-04-30: 12:00:00 120 mg via INTRAVENOUS
  Filled 2019-04-30: qty 60

## 2019-04-30 MED ORDER — SODIUM CHLORIDE 0.9 % IV SOLN
40.0000 mg | Freq: Once | INTRAVENOUS | Status: AC
  Administered 2019-04-30: 40 mg via INTRAVENOUS
  Filled 2019-04-30: qty 4

## 2019-04-30 NOTE — Patient Instructions (Addendum)
[place] at [facility] Discharge Instructions  You were seen today by Dr. Delton Coombes. He went over your recent lab results. He will schedule you for a pulmonary function test to evaluate your breathing. He will see you back in 4 weeks for labs, treatment and follow up.   Thank you for choosing [place] at [facility] to provide your oncology and hematology care.  To afford each patient quality time with our provider, please arrive at least 15 minutes before your scheduled appointment time.   If you have a lab appointment with the [facility] please come in thru the  Main Entrance and check in at the main information desk  You need to re-schedule your appointment should you arrive 10 or more minutes late.  We strive to give you quality time with our providers, and arriving late affects you and other patients whose appointments are after yours.  Also, if you no show three or more times for appointments you may be dismissed from the clinic at the providers discretion.     Again, thank you for choosing [facility].  Our hope is that these requests will decrease the amount of time that you wait before being seen by our physicians.       _____________________________________________________________  Should you have questions after your visit to [facility], please contact our office at [phone] between the hours of 8:00 a.m. and 4:30 p.m.  Voicemails left after 4:00 p.m. will not be returned until the following business day.  For prescription refill requests, have your pharmacy contact our office and allow 72 hours.    Cancer Center Support Programs:   > Cancer Support Group  2nd Tuesday of the month 1pm-2pm, Journey Room  \

## 2019-04-30 NOTE — Progress Notes (Signed)
Patient has been assessed, vital signs and labs have been reviewed by Dr. Katragadda. ANC, Creatinine, LFTs, and Platelets are within treatment parameters per Dr. Katragadda. The patient is good to proceed with treatment at this time.  

## 2019-04-30 NOTE — Patient Instructions (Signed)
[facility] Discharge Instructions for Patients Receiving Chemotherapy   Beginning January 23rd 2017 lab work for the Cancer Center will be done in the  Main lab at [place] on 1st floor. If you have a lab appointment with the Cancer Center please come in thru the  Main Entrance and check in at the main information desk   Today you received the following chemotherapy agents Kyprolis. Follow-up as scheduled. Call clinic for any questions or concerns  To help prevent nausea and vomiting after your treatment, we encourage you to take your nausea medication   If you develop nausea and vomiting, or diarrhea that is not controlled by your medication, call the clinic.  The clinic phone number is [phone]. Office hours are Monday-Friday 8:30am-5:00pm.  BELOW ARE SYMPTOMS THAT SHOULD BE REPORTED IMMEDIATELY:  *FEVER GREATER THAN 101.0 F  *CHILLS WITH OR WITHOUT FEVER  NAUSEA AND VOMITING THAT IS NOT CONTROLLED WITH YOUR NAUSEA MEDICATION  *UNUSUAL SHORTNESS OF BREATH  *UNUSUAL BRUISING OR BLEEDING  TENDERNESS IN MOUTH AND THROAT WITH OR WITHOUT PRESENCE OF ULCERS  *URINARY PROBLEMS  *BOWEL PROBLEMS  UNUSUAL RASH Items with * indicate a potential emergency and should be followed up as soon as possible. If you have an emergency after office hours please contact your primary care physician or go to the nearest emergency department.  Please call the clinic during office hours if you have any questions or concerns.   You may also contact the Patient Navigator at [phone] should you have any questions or need assistance in obtaining follow up care.      Resources For Cancer Patients and their Caregivers ? American Cancer Society: Can assist with transportation, wigs, general needs, runs Look Good Feel Better.        1-888-227-6333 ? Cancer Care: Provides financial assistance, online support groups, medication/co-pay assistance.  1-800-813-HOPE  (4673) ? [facility] Assists [place] cancer patients and their families through emotional , educational and financial support.  336-427-4357 ? [name] Where to apply for food stamps, Medicaid and utility assistance. 336-342-1394 ? [name]: Transportation to medical appointments. 336-347-2287 ? Social Security Administration: May apply for disability if have a Stage IV cancer. 336-342-7796 1-800-772-1213 ? [place] Aging, Disability and Transit Services: Assists with nutrition, care and transit needs. 336-349-2343         

## 2019-04-30 NOTE — Progress Notes (Signed)
[place], [place] [address]   CLINIC:  Medical Oncology/Hematology  PCP:  Abran Richard, MD [address] [place] [address] [phone]   REASON FOR VISIT:  Follow-up for multiple myeloma   BRIEF ONCOLOGIC HISTORY:  Oncology History  Multiple myeloma ([place])  08/07/2014 Imaging   Bone Survey- No lytic lesions are noted in the visualized skeleton.   09/03/2014 Bone Marrow Biopsy   NORMOCELLULAR BONE MARROW FOR AGE WITH PLASMA CELL NEOPLASM.  The plasma cell component is increased in the marrow representing an estimated 18% of all cells. Cytogenetics with 13q-, 17p- (high risk disease)   09/03/2014 Pathology Results   Cytogenetics with 13q-, 17p- (high risk disease)   09/23/2014 Initial Diagnosis   Multiple myeloma   09/30/2014 PET scan   No abnormal hypermetabolism in the neck, chest, abdomen or pelvis.   10/05/2014 - 03/22/2015 Chemotherapy   RVD   10/28/2014 Treatment Plan Change   Issues related to getting Revlimid in a timely fashion, therefore, she received her Revlimid on 9/21 resulting in a 12 day cycle this time instead of a 14 day cycle   11/02/2014 Imaging   CTA chest- No evidence for a large or central pulmonary embolism as described.  8 mm density along the right minor fissure could represent focal pleural thickening but indeterminate. If the patient is at high risk for bronchogenic carcinoma, follow-up c   11/23/2014 Miscellaneous   Zometa 4 mg IV monthly   04/07/2015 Bone Marrow Biopsy   Normocellular marrow with 2-4% clonal plasma cells by immunohistochemistry. FISH and cytogenetics were normal [facility])     04/2015 Miscellaneous   PRETRANSPLANT EVALUATION:  Pulmonary function tests: FEV1 100.3% / DLCO 97.9%  Echocardiogram: Normal LV function with EF 60-65%    04/27/2015 Procedure   Stem cell mobilization with filgrastim and [facility])   05/06/2015 Miscellaneous   BMT conditioning regimen with high-dose  Melphalan given [facility], [name]); Day -1   05/07/2015 Bone Marrow Transplant   Outpatient autologous stem cell transplant [facility], [name]); Day 0   05/18/2015 Miscellaneous   WBC engraftment;  did not require platelet transfusion during her transplant process. Lowest platelet count 28,000    05/20/2015 Procedure   Tunneled catheter removed [facility])   09/08/2015 -  Chemotherapy   Velcade every 2 weeks   12/15/2015 Miscellaneous   Zometa re-instituted.    12/23/2015 Imaging   Bone density- BMD as determined from [facility]ius 33% is 0.799 g/cm2 with a T-Score of 1.2. This patient is considered normal according to [facility]) criteria.   04/17/2016 Miscellaneous   Started maintenance with Ninlaro.   05/04/2016 Treatment Plan Change   Zometa switched to Xgeva injection monthly given difficult IV access.    03/05/2019 -  Chemotherapy   The patient had carfilzomib (KYPROLIS) 40 mg in dextrose 5 % 50 mL chemo infusion, 18 mg/m2 = 44 mg, Intravenous,  Once, 3 of 4 cycles Dose modification: 56 mg/m2 (original dose 56 mg/m2, Cycle 1, Reason: Provider Judgment) Administration: 40 mg (03/05/2019), 120 mg (03/12/2019), 120 mg (03/20/2019), 120 mg (04/02/2019), 120 mg (04/09/2019), 120 mg (04/16/2019), 120 mg (04/30/2019)  for chemotherapy treatment.       CANCER STAGING: Cancer Staging Multiple myeloma [facility]) Staging form: Multiple Myeloma, AJCC 6th Edition - Clinical stage from 10/26/2014: Stage IIA - Signed by Baird Cancer, PA-C on 10/26/2014 - Pathologic: No stage assigned - Unsigned    INTERVAL HISTORY:  Kelli Hurley 64 y.o. female seen for follow-up of multiple myeloma and toxicity assessment prior to next cycle of chemotherapy.  Last cycle of chemotherapy was on 04/02/2019.  She reports only very mild improvement in shortness of breath on exertion after using albuterol twice daily.  Denies any chest pains.  Appetite is 100%.  Energy levels are 50%.   Chronic pains in the knees has been stable.  Denies any fevers or night sweats.  REVIEW OF SYSTEMS:  Review of Systems  Respiratory: Positive for shortness of breath.   Musculoskeletal: Positive for arthralgias.  Neurological: Positive for numbness.  All other systems reviewed and are negative.    PAST MEDICAL/SURGICAL HISTORY:  Past Medical History:  Diagnosis Date  . Anemia   . Arthritis   . Cancer ([place])   . Claustrophobia 10/05/2014  . Hypertension   . Leukopenia 08/03/2014  . Normocytic hypochromic anemia 08/03/2014  . Renal disorder    stage 3    Past Surgical History:  Procedure Laterality Date  . ABDOMINAL HYSTERECTOMY    . CARDIAC SURGERY    . OTHER SURGICAL HISTORY     heart surgery as infant to "repair hole in heart"  . PORTACATH PLACEMENT Left 02/21/2019   Procedure: INSERTION PORT-A-CATH;  Surgeon: Virl Cagey, MD;  Location: [facility];  Service: General;  Laterality: Left;     SOCIAL HISTORY:  Social History   Socioeconomic History  . Marital status: Legally Separated    Spouse name: Not on file  . Number of children: Not on file  . Years of education: Not on file  . Highest education level: Not on file  Occupational History  . Not on file  Tobacco Use  . Smoking status: Never Smoker  . Smokeless tobacco: Never Used  Substance and Sexual Activity  . Alcohol use: No  . Drug use: No  . Sexual activity: Not on file    Comment: divorced- 2 daughters  Other Topics Concern  . Not on file  Social History Narrative  . Not on file   Social Determinants of Health   Financial Resource Strain:   . Difficulty of Paying Living Expenses:   Food Insecurity:   . Worried About Charity fundraiser in the Last Year:   . Arboriculturist in the Last Year:   Transportation Needs:   . Film/video editor (Medical):   Marland Kitchen Lack of Transportation (Non-Medical):   Physical Activity:   . Days of Exercise per Week:   . Minutes of Exercise per Session:   Stress:    . Feeling of Stress :   Social Connections:   . Frequency of Communication with Friends and Family:   . Frequency of Social Gatherings with Friends and Family:   . Attends Religious Services:   . Active Member of Clubs or Organizations:   . Attends Archivist Meetings:   Marland Kitchen Marital Status:   Intimate Partner Violence:   . Fear of Current or Ex-Partner:   . Emotionally Abused:   Marland Kitchen Physically Abused:   . Sexually Abused:     FAMILY HISTORY:  Family History  Problem Relation Age of Onset  . Cancer Mother   . Hypertension Mother   . Cancer Father   . Hypertension Father   . Cancer Maternal Grandmother   . Hypertension Sister   . Hypertension Brother     CURRENT MEDICATIONS:  Outpatient Encounter Medications as of 04/30/2019  Medication Sig  . acyclovir (ZOVIRAX) 400  MG tablet Take 1 tablet (400 mg total) by mouth 2 (two) times daily.  . Ascorbic Acid (VITAMIN C) 1000 MG tablet Take 1,000 mg by mouth daily.   Marland Kitchen aspirin EC 81 MG tablet Take 81 mg by mouth daily.  Marland Kitchen CARFILZOMIB IV Inject into the vein.  . cholecalciferol (VITAMIN D) 1000 units tablet Take 1,000 Units by mouth daily.  Marland Kitchen dexamethasone (DECADRON) 4 MG tablet Take 10 tablets (40 mg total) by mouth once a week. On the week you are off of treatment  . gabapentin (NEURONTIN) 300 MG capsule Take 1 capsule (300 mg total) by mouth 3 (three) times daily. (Patient taking differently: Take 300 mg by mouth 2 (two) times daily. )  . hydrochlorothiazide (HYDRODIURIL) 25 MG tablet TAKE 1 TABLET BY MOUTH ONCE DAILY. (Patient taking differently: Take 25 mg by mouth daily. )  . lidocaine-prilocaine (EMLA) cream Apply a quarter sized glob 1 hour prior to treatment.  . magnesium oxide (MAG-OX) 400 (241.3 Mg) MG tablet Take 1 tablet (400 mg total) by mouth 2 (two) times daily.  . Multiple Vitamin (TAB-A-VITE) TABS TAKE 1 TABLET BY MOUTH ONCE DAILY. (Patient taking differently: Take 1 tablet by mouth daily. )  . pomalidomide  (POMALYST) 4 MG capsule Take 1 capsule (4 mg total) by mouth daily.  Marland Kitchen trolamine salicylate (ASPERCREME) 10 % cream Apply 1 application topically 2 (two) times daily.  Marland Kitchen acetaminophen (TYLENOL 8 HOUR ARTHRITIS PAIN) 650 MG CR tablet Take 650 mg by mouth every 8 (eight) hours as needed for pain.  Marland Kitchen albuterol (VENTOLIN HFA) 108 (90 Base) MCG/ACT inhaler Inhale 2 puffs into the lungs every 6 (six) hours as needed for wheezing or shortness of breath. (Patient not taking: Reported on 02/26/2019)  . ibuprofen (ADVIL) 400 MG tablet Take 1 tablet (400 mg total) by mouth every 8 (eight) hours as needed. (Patient not taking: Reported on 03/05/2019)  . prochlorperazine (COMPAZINE) 10 MG tablet Take 1 tablet (10 mg total) by mouth every 6 (six) hours as needed for nausea or vomiting. (Patient not taking: Reported on 02/13/2019)   Facility-Administered Encounter Medications as of 04/30/2019  Medication  . heparin lock flush 100 unit/mL  . sodium chloride 0.9 % injection 10 mL    ALLERGIES:  Allergies  Allergen Reactions  . Diclofenac Swelling    Per pt, facial swelling     PHYSICAL EXAM:  ECOG Performance status: 1  Vitals:   04/30/19 0946  BP: 108/68  Pulse: 77  Resp: 20  Temp: (!) 97.5 F (36.4 C)  SpO2: 94%   Filed Weights   04/30/19 0946  Weight: (!) 343 lb 6.4 oz (155.8 kg)    Physical Exam Vitals reviewed.  Constitutional:      Appearance: Normal appearance.  Cardiovascular:     Rate and Rhythm: Normal rate and regular rhythm.     Heart sounds: Normal heart sounds.  Pulmonary:     Effort: Pulmonary effort is normal.     Breath sounds: Normal breath sounds.  Abdominal:     General: There is no distension.     Palpations: Abdomen is soft. There is no mass.  Musculoskeletal:        General: No swelling.  Skin:    General: Skin is warm.  Neurological:     General: No focal deficit present.     Mental Status: She is alert and oriented to person, place, and time.  Psychiatric:         Mood and Affect:  Mood normal.        Behavior: Behavior normal.      LABORATORY DATA:  I have reviewed the labs as listed.  CBC    Component Value Date/Time   WBC 6.0 04/30/2019 0950   RBC 3.49 (L) 04/30/2019 0950   HGB 10.5 (L) 04/30/2019 0950   HCT 33.1 (L) 04/30/2019 0950   PLT 259 04/30/2019 0950   MCV 94.8 04/30/2019 0950   MCH 30.1 04/30/2019 0950   MCHC 31.7 04/30/2019 0950   RDW 14.6 04/30/2019 0950   LYMPHSABS 1.8 04/30/2019 0950   MONOABS 0.8 04/30/2019 0950   EOSABS 0.0 04/30/2019 0950   BASOSABS 0.1 04/30/2019 0950   CMP Latest Ref Rng & Units 04/30/2019 04/23/2019 04/16/2019  Glucose 70 - 99 mg/dL 93 104(H) 141(H)  BUN 8 - 23 mg/dL 29(H) 26(H) 23  Creatinine 0.44 - 1.00 mg/dL 1.12(H) 1.20(H) 1.34(H)  Sodium 135 - 145 mmol/L 138 138 135  Potassium 3.5 - 5.1 mmol/L 3.3(L) 3.6 3.4(L)  Chloride 98 - 111 mmol/L 101 98 99  CO2 22 - 32 mmol/L 28 29 26   Calcium 8.9 - 10.3 mg/dL 9.1 9.7 8.7(L)  Total Protein 6.5 - 8.1 g/dL 7.2 6.8 6.9  Total Bilirubin 0.3 - 1.2 mg/dL 0.5 0.7 0.8  Alkaline Phos 38 - 126 U/L 52 49 48  AST 15 - 41 U/L 16 16 17   ALT 0 - 44 U/L 14 17 19        DIAGNOSTIC IMAGING:  I have independently reviewed scans.   I have reviewed Venita Lick LPN's note and agree with the documentation.  I personally performed a face-to-face visit, made revisions and my assessment and plan is as follows.    ASSESSMENT & PLAN:   Multiple myeloma ([place]) 1.  Relapsed IgG kappa multiple myeloma with high risk features: -2 cycles of carfilzomib, pomalidomide and dexamethasone from 03/05/2019 through 04/02/2019. -We reviewed myeloma panel from 04/23/2019.  M spike improved to 0.5 g.  This was 1.1 g pretreatment.  Light chain ratio improved to 1.27, previously 2.69. -We reviewed CBC today.  Hemoglobin stable at 10.5 and white count normal.  Creatinine improved to 1.13. -She will start her cycle 3 today.  She will start pomalidomide 4 mg 3 weeks on 1 week off  today. -I will reevaluate her in 4 weeks with repeat labs.  2.  Shortness of breath on exertion: -2D echo on 02/09/2019 shows EF 60 to 65%.  No LVH.  Chest CT PE protocol on 02/24/2019 was negative. -Cardiac MRI on 03/07/2019 shows LVEF 56% with no amyloid.  Troponin T and proBNP were negative. -Shortness of breath on exertion is slightly worse in the last few weeks.  She has used albuterol inhaler with minimal improvement. -I have recommended PFTs.  She reportedly had last PFTs 2 to 3 years ago prior to bone marrow transplant at [facility].  3.  ID prophylaxis: -She will continue 400 mg acyclovir twice daily.  She will continue aspirin 81 mg for thromboprophylaxis.  4.  Neuropathy: -She has some neuropathy in the right hand.  She takes gabapentin 300 mg 3 times daily.  5.  Lower extremity edema: -She has trace edema in the legs and stable.  She is continuing her HCTZ 25 mg daily.   Orders placed this encounter:  Orders Placed This Encounter  Procedures  . Pulmonary Function Test      Derek Jack, MD [place] [phone]

## 2019-04-30 NOTE — Progress Notes (Signed)
1050 Labs reviewed with and pt seen by Dr. Delton Coombes and pt approved for Kyprolis infusion today per MD                                   Corrin Parker tolerated Kyprolis infusion well without complaints or incident. VSS upon discharge. Pt discharged via wheelchair in satisfactory condition accompanied by family member

## 2019-04-30 NOTE — Assessment & Plan Note (Signed)
1.  Relapsed IgG kappa multiple myeloma with high risk features: -2 cycles of carfilzomib, pomalidomide and dexamethasone from 03/05/2019 through 04/02/2019. -We reviewed myeloma panel from 04/23/2019.  M spike improved to 0.5 g.  This was 1.1 g pretreatment.  Light chain ratio improved to 1.27, previously 2.69. -We reviewed CBC today.  Hemoglobin stable at 10.5 and white count normal.  Creatinine improved to 1.13. -She will start her cycle 3 today.  She will start pomalidomide 4 mg 3 weeks on 1 week off today. -I will reevaluate her in 4 weeks with repeat labs.  2.  Shortness of breath on exertion: -2D echo on 02/09/2019 shows EF 60 to 65%.  No LVH.  Chest CT PE protocol on 02/24/2019 was negative. -Cardiac MRI on 03/07/2019 shows LVEF 56% with no amyloid.  Troponin T and proBNP were negative. -Shortness of breath on exertion is slightly worse in the last few weeks.  She has used albuterol inhaler with minimal improvement. -I have recommended PFTs.  She reportedly had last PFTs 2 to 3 years ago prior to bone marrow transplant at [facility].  3.  ID prophylaxis: -She will continue 400 mg acyclovir twice daily.  She will continue aspirin 81 mg for thromboprophylaxis.  4.  Neuropathy: -She has some neuropathy in the right hand.  She takes gabapentin 300 mg 3 times daily.  5.  Lower extremity edema: -She has trace edema in the legs and stable.  She is continuing her HCTZ 25 mg daily.

## 2019-05-07 ENCOUNTER — Ambulatory Visit (HOSPITAL_COMMUNITY): Payer: Medicare Other

## 2019-05-07 ENCOUNTER — Other Ambulatory Visit (HOSPITAL_COMMUNITY): Payer: Medicare Other

## 2019-05-08 ENCOUNTER — Inpatient Hospital Stay (HOSPITAL_COMMUNITY): Payer: Medicare Other | Attending: Hematology

## 2019-05-08 ENCOUNTER — Encounter (HOSPITAL_COMMUNITY): Payer: Self-pay

## 2019-05-08 ENCOUNTER — Other Ambulatory Visit: Payer: Self-pay

## 2019-05-08 ENCOUNTER — Inpatient Hospital Stay (HOSPITAL_COMMUNITY): Payer: Medicare Other

## 2019-05-08 VITALS — BP 113/67 | HR 83 | Temp 98.4°F | Resp 20 | Wt 347.0 lb

## 2019-05-08 DIAGNOSIS — R600 Localized edema: Secondary | ICD-10-CM | POA: Diagnosis not present

## 2019-05-08 DIAGNOSIS — Z8249 Family history of ischemic heart disease and other diseases of the circulatory system: Secondary | ICD-10-CM | POA: Diagnosis not present

## 2019-05-08 DIAGNOSIS — R0602 Shortness of breath: Secondary | ICD-10-CM | POA: Diagnosis not present

## 2019-05-08 DIAGNOSIS — M2550 Pain in unspecified joint: Secondary | ICD-10-CM | POA: Diagnosis not present

## 2019-05-08 DIAGNOSIS — M1990 Unspecified osteoarthritis, unspecified site: Secondary | ICD-10-CM | POA: Insufficient documentation

## 2019-05-08 DIAGNOSIS — C9000 Multiple myeloma not having achieved remission: Secondary | ICD-10-CM

## 2019-05-08 DIAGNOSIS — Z7952 Long term (current) use of systemic steroids: Secondary | ICD-10-CM | POA: Insufficient documentation

## 2019-05-08 DIAGNOSIS — G629 Polyneuropathy, unspecified: Secondary | ICD-10-CM | POA: Insufficient documentation

## 2019-05-08 DIAGNOSIS — Z809 Family history of malignant neoplasm, unspecified: Secondary | ICD-10-CM | POA: Diagnosis not present

## 2019-05-08 DIAGNOSIS — Z5112 Encounter for antineoplastic immunotherapy: Secondary | ICD-10-CM | POA: Diagnosis not present

## 2019-05-08 DIAGNOSIS — Z79899 Other long term (current) drug therapy: Secondary | ICD-10-CM | POA: Diagnosis not present

## 2019-05-08 DIAGNOSIS — C9002 Multiple myeloma in relapse: Secondary | ICD-10-CM | POA: Diagnosis present

## 2019-05-08 LAB — CBC WITH DIFFERENTIAL/PLATELET
Abs Immature Granulocytes: 0.21 10*3/uL — ABNORMAL HIGH (ref 0.00–0.07)
Basophils Absolute: 0 10*3/uL (ref 0.0–0.1)
Basophils Relative: 1 %
Eosinophils Absolute: 0.2 10*3/uL (ref 0.0–0.5)
Eosinophils Relative: 3 %
HCT: 33.3 % — ABNORMAL LOW (ref 36.0–46.0)
Hemoglobin: 10.8 g/dL — ABNORMAL LOW (ref 12.0–15.0)
Immature Granulocytes: 3 %
Lymphocytes Relative: 15 %
Lymphs Abs: 1.1 10*3/uL (ref 0.7–4.0)
MCH: 30.4 pg (ref 26.0–34.0)
MCHC: 32.4 g/dL (ref 30.0–36.0)
MCV: 93.8 fL (ref 80.0–100.0)
Monocytes Absolute: 0.5 10*3/uL (ref 0.1–1.0)
Monocytes Relative: 7 %
Neutro Abs: 5.3 10*3/uL (ref 1.7–7.7)
Neutrophils Relative %: 71 %
Platelets: 141 10*3/uL — ABNORMAL LOW (ref 150–400)
RBC: 3.55 MIL/uL — ABNORMAL LOW (ref 3.87–5.11)
RDW: 14.7 % (ref 11.5–15.5)
WBC: 7.4 10*3/uL (ref 4.0–10.5)
nRBC: 0 % (ref 0.0–0.2)

## 2019-05-08 LAB — COMPREHENSIVE METABOLIC PANEL
ALT: 16 U/L (ref 0–44)
AST: 16 U/L (ref 15–41)
Albumin: 3.6 g/dL (ref 3.5–5.0)
Alkaline Phosphatase: 58 U/L (ref 38–126)
Anion gap: 13 (ref 5–15)
BUN: 26 mg/dL — ABNORMAL HIGH (ref 8–23)
CO2: 26 mmol/L (ref 22–32)
Calcium: 9.2 mg/dL (ref 8.9–10.3)
Chloride: 97 mmol/L — ABNORMAL LOW (ref 98–111)
Creatinine, Ser: 1.12 mg/dL — ABNORMAL HIGH (ref 0.44–1.00)
GFR calc Af Amer: >60 mL/min (ref >60)
GFR calc non Af Amer: 53 mL/min — ABNORMAL LOW (ref >60)
Glucose, Bld: 115 mg/dL — ABNORMAL HIGH (ref 70–99)
Potassium: 3.3 mmol/L — ABNORMAL LOW (ref 3.5–5.1)
Sodium: 136 mmol/L (ref 135–145)
Total Bilirubin: 0.7 mg/dL (ref 0.3–1.2)
Total Protein: 7.3 g/dL (ref 6.5–8.1)

## 2019-05-08 MED ORDER — DEXTROSE 5 % IV SOLN
54.5000 mg/m2 | Freq: Once | INTRAVENOUS | Status: AC
  Administered 2019-05-08: 120 mg via INTRAVENOUS
  Filled 2019-05-08: qty 60

## 2019-05-08 MED ORDER — SODIUM CHLORIDE 0.9 % IV SOLN
40.0000 mg | Freq: Once | INTRAVENOUS | Status: AC
  Administered 2019-05-08: 40 mg via INTRAVENOUS
  Filled 2019-05-08: qty 4

## 2019-05-08 MED ORDER — HEPARIN SOD (PORK) LOCK FLUSH 100 UNIT/ML IV SOLN
500.0000 [IU] | Freq: Once | INTRAVENOUS | Status: AC | PRN
  Administered 2019-05-08: 500 [IU]

## 2019-05-08 MED ORDER — SODIUM CHLORIDE 0.9 % IV SOLN: Freq: Once | INTRAVENOUS | Status: AC

## 2019-05-08 MED ORDER — SODIUM CHLORIDE 0.9% FLUSH
10.0000 mL | INTRAVENOUS | Status: DC | PRN
  Administered 2019-05-08: 09:00:00 10 mL

## 2019-05-08 NOTE — Progress Notes (Signed)
Kelli Hurley tolerated Kyprolis infusion well without complaints or incident.Labs reviewed prior to administering this medication.Pt continues to take her Pomalyst as prescribed without issues. VSS upon discharge. Pt discharged via wheelchair in satisfactory condition

## 2019-05-08 NOTE — Patient Instructions (Signed)
[facility] at [facility] Discharge Instructions  Labs drawn from portacath today   Thank you for choosing [facility] at [facility] to provide your oncology and hematology care.  To afford each patient quality time with our provider, please arrive at least 15 minutes before your scheduled appointment time.   If you have a lab appointment with the Cancer Center please come in thru the Main Entrance and check in at the main information desk.  You need to re-schedule your appointment should you arrive 10 or more minutes late.  We strive to give you quality time with our providers, and arriving late affects you and other patients whose appointments are after yours.  Also, if you no show three or more times for appointments you may be dismissed from the clinic at the providers discretion.     Again, thank you for choosing [facility].  Our hope is that these requests will decrease the amount of time that you wait before being seen by our physicians.       _____________________________________________________________  Should you have questions after your visit to [facility], please contact our office at [phone] between the hours of 8:00 a.m. and 4:30 p.m.  Voicemails left after 4:00 p.m. will not be returned until the following business day.  For prescription refill requests, have your pharmacy contact our office and allow 72 hours.    Due to Covid, you will need to wear a mask upon entering the hospital. If you do not have a mask, a mask will be given to you at the Main Entrance upon arrival. For doctor visits, patients may have 1 support person with them. For treatment visits, patients can not have anyone with them due to social distancing guidelines and our immunocompromised population.     

## 2019-05-08 NOTE — Patient Instructions (Signed)
[facility] Discharge Instructions for Patients Receiving Chemotherapy   Beginning January 23rd 2017 lab work for the Cancer Center will be done in the  Main lab at [facility] on 1st floor. If you have a lab appointment with the Cancer Center please come in thru the  Main Entrance and check in at the main information desk   Today you received the following chemotherapy agents Kyprolis. Follow-up as scheduled. Call clinic for any questions or concerns  To help prevent nausea and vomiting after your treatment, we encourage you to take your nausea medication   If you develop nausea and vomiting, or diarrhea that is not controlled by your medication, call the clinic.  The clinic phone number is [phone]. Office hours are Monday-Friday 8:30am-5:00pm.  BELOW ARE SYMPTOMS THAT SHOULD BE REPORTED IMMEDIATELY:  *FEVER GREATER THAN 101.0 F  *CHILLS WITH OR WITHOUT FEVER  NAUSEA AND VOMITING THAT IS NOT CONTROLLED WITH YOUR NAUSEA MEDICATION  *UNUSUAL SHORTNESS OF BREATH  *UNUSUAL BRUISING OR BLEEDING  TENDERNESS IN MOUTH AND THROAT WITH OR WITHOUT PRESENCE OF ULCERS  *URINARY PROBLEMS  *BOWEL PROBLEMS  UNUSUAL RASH Items with * indicate a potential emergency and should be followed up as soon as possible. If you have an emergency after office hours please contact your primary care physician or go to the nearest emergency department.  Please call the clinic during office hours if you have any questions or concerns.   You may also contact the Patient Navigator at [phone] should you have any questions or need assistance in obtaining follow up care.      Resources For Cancer Patients and their Caregivers ? American Cancer Society: Can assist with transportation, wigs, general needs, runs Look Good Feel Better.        1-888-227-6333 ? Cancer Care: Provides financial assistance, online support groups, medication/co-pay assistance.  1-800-813-HOPE  (4673) ? [facility] Assists [place] cancer patients and their families through emotional , educational and financial support.  336-427-4357 ? [name] Where to apply for food stamps, Medicaid and utility assistance. 336-342-1394 ? [name]: Transportation to medical appointments. 336-347-2287 ? Social Security Administration: May apply for disability if have a Stage IV cancer. 336-342-7796 1-800-772-1213 ? [place] Aging, Disability and Transit Services: Assists with nutrition, care and transit needs. 336-349-2343         

## 2019-05-12 NOTE — Progress Notes (Signed)
.  Pharmacist Chemotherapy Monitoring - Follow Up Assessment    I verify that I have reviewed each item in the below checklist:  . Regimen for the patient is scheduled for the appropriate day and plan matches scheduled date. Marland Kitchen Appropriate non-routine labs are ordered dependent on drug ordered. . If applicable, additional medications reviewed and ordered per protocol based on lifetime cumulative doses and/or treatment regimen.   Plan for follow-up and/or issues identified: No . I-vent associated with next due treatment: No . MD and/or nursing notified: No  Kelli Hurley 05/12/2019 3:29 PM

## 2019-05-14 ENCOUNTER — Ambulatory Visit (HOSPITAL_COMMUNITY): Payer: Medicare Other | Admitting: Hematology

## 2019-05-14 ENCOUNTER — Inpatient Hospital Stay (HOSPITAL_COMMUNITY): Payer: Medicare Other

## 2019-05-14 ENCOUNTER — Other Ambulatory Visit: Payer: Self-pay

## 2019-05-14 ENCOUNTER — Other Ambulatory Visit (HOSPITAL_COMMUNITY): Payer: Medicare Other

## 2019-05-14 VITALS — BP 136/72 | HR 88 | Temp 96.9°F | Resp 20 | Wt 342.8 lb

## 2019-05-14 DIAGNOSIS — C9000 Multiple myeloma not having achieved remission: Secondary | ICD-10-CM

## 2019-05-14 DIAGNOSIS — Z5112 Encounter for antineoplastic immunotherapy: Secondary | ICD-10-CM | POA: Diagnosis not present

## 2019-05-14 LAB — COMPREHENSIVE METABOLIC PANEL WITH GFR
ALT: 17 U/L (ref 0–44)
AST: 16 U/L (ref 15–41)
Albumin: 3.6 g/dL (ref 3.5–5.0)
Alkaline Phosphatase: 50 U/L (ref 38–126)
Anion gap: 11 (ref 5–15)
BUN: 27 mg/dL — ABNORMAL HIGH (ref 8–23)
CO2: 27 mmol/L (ref 22–32)
Calcium: 9.7 mg/dL (ref 8.9–10.3)
Chloride: 99 mmol/L (ref 98–111)
Creatinine, Ser: 1.03 mg/dL — ABNORMAL HIGH (ref 0.44–1.00)
GFR calc Af Amer: >60 mL/min
GFR calc non Af Amer: 58 mL/min — ABNORMAL LOW
Glucose, Bld: 112 mg/dL — ABNORMAL HIGH (ref 70–99)
Potassium: 3.5 mmol/L (ref 3.5–5.1)
Sodium: 137 mmol/L (ref 135–145)
Total Bilirubin: 1 mg/dL (ref 0.3–1.2)
Total Protein: 7.2 g/dL (ref 6.5–8.1)

## 2019-05-14 LAB — CBC WITH DIFFERENTIAL/PLATELET
Abs Immature Granulocytes: 0.1 K/uL — ABNORMAL HIGH (ref 0.00–0.07)
Basophils Absolute: 0 K/uL (ref 0.0–0.1)
Basophils Relative: 0 %
Eosinophils Absolute: 0.3 K/uL (ref 0.0–0.5)
Eosinophils Relative: 4 %
HCT: 32.9 % — ABNORMAL LOW (ref 36.0–46.0)
Hemoglobin: 10.5 g/dL — ABNORMAL LOW (ref 12.0–15.0)
Immature Granulocytes: 2 %
Lymphocytes Relative: 15 %
Lymphs Abs: 0.9 K/uL (ref 0.7–4.0)
MCH: 30 pg (ref 26.0–34.0)
MCHC: 31.9 g/dL (ref 30.0–36.0)
MCV: 94 fL (ref 80.0–100.0)
Monocytes Absolute: 0.6 K/uL (ref 0.1–1.0)
Monocytes Relative: 10 %
Neutro Abs: 4.1 K/uL (ref 1.7–7.7)
Neutrophils Relative %: 69 %
Platelets: 97 K/uL — ABNORMAL LOW (ref 150–400)
RBC: 3.5 MIL/uL — ABNORMAL LOW (ref 3.87–5.11)
RDW: 15.3 % (ref 11.5–15.5)
WBC: 6 K/uL (ref 4.0–10.5)
nRBC: 0 % (ref 0.0–0.2)

## 2019-05-14 LAB — LACTATE DEHYDROGENASE: LDH: 246 U/L — ABNORMAL HIGH (ref 98–192)

## 2019-05-14 MED ORDER — SODIUM CHLORIDE 0.9 % IV SOLN: Freq: Once | INTRAVENOUS | Status: AC

## 2019-05-14 MED ORDER — SODIUM CHLORIDE 0.9 % IV SOLN
40.0000 mg | Freq: Once | INTRAVENOUS | Status: AC
  Administered 2019-05-14: 12:00:00 40 mg via INTRAVENOUS
  Filled 2019-05-14: qty 4

## 2019-05-14 MED ORDER — HEPARIN SOD (PORK) LOCK FLUSH 100 UNIT/ML IV SOLN
500.0000 [IU] | Freq: Once | INTRAVENOUS | Status: AC | PRN
  Administered 2019-05-14: 500 [IU]

## 2019-05-14 MED ORDER — SODIUM CHLORIDE 0.9% FLUSH
10.0000 mL | INTRAVENOUS | Status: DC | PRN
  Administered 2019-05-14: 10:00:00 10 mL

## 2019-05-14 MED ORDER — DEXTROSE 5 % IV SOLN
54.5000 mg/m2 | Freq: Once | INTRAVENOUS | Status: AC
  Administered 2019-05-14: 120 mg via INTRAVENOUS
  Filled 2019-05-14: qty 60

## 2019-05-14 NOTE — Patient Instructions (Signed)
[facility] Discharge Instructions for Patients Receiving Chemotherapy   Beginning January 23rd 2017 lab work for the Cancer Center will be done in the  Main lab at  on 1st floor. If you have a lab appointment with the Cancer Center please come in thru the  Main Entrance and check in at the main information desk   Today you received the following chemotherapy agents Kyprolis  To help prevent nausea and vomiting after your treatment, we encourage you to take your nausea medication If you develop nausea and vomiting, or diarrhea that is not controlled by your medication, call the clinic.  The clinic phone number is [phone]. Office hours are Monday-Friday 8:30am-5:00pm.  BELOW ARE SYMPTOMS THAT SHOULD BE REPORTED IMMEDIATELY:  *FEVER GREATER THAN 101.0 F  *CHILLS WITH OR WITHOUT FEVER  NAUSEA AND VOMITING THAT IS NOT CONTROLLED WITH YOUR NAUSEA MEDICATION  *UNUSUAL SHORTNESS OF BREATH  *UNUSUAL BRUISING OR BLEEDING  TENDERNESS IN MOUTH AND THROAT WITH OR WITHOUT PRESENCE OF ULCERS  *URINARY PROBLEMS  *BOWEL PROBLEMS  UNUSUAL RASH Items with * indicate a potential emergency and should be followed up as soon as possible. If you have an emergency after office hours please contact your primary care physician or go to the nearest emergency department.  Please call the clinic during office hours if you have any questions or concerns.   You may also contact the Patient Navigator at [phone] should you have any questions or need assistance in obtaining follow up care.      Resources For Cancer Patients and their Caregivers ? American Cancer Society: Can assist with transportation, wigs, general needs, runs Look Good Feel Better.        1-888-227-6333 ? Cancer Care: Provides financial assistance, online support groups, medication/co-pay assistance.  1-800-813-HOPE (4673) ? [facility] Assists [place] cancer  patients and their families through emotional , educational and financial support.  336-427-4357 ? [name] Where to apply for food stamps, Medicaid and utility assistance. 336-342-1394 ? [name]: Transportation to medical appointments. 336-347-2287 ? Social Security Administration: May apply for disability if have a Stage IV cancer. 336-342-7796 1-800-772-1213 ? [place] Aging, Disability and Transit Services: Assists with nutrition, care and transit needs. 336-349-2343          

## 2019-05-14 NOTE — Progress Notes (Signed)
1110- Lab results and vitals reviewed, including platelets of 97K. Per Dr. Raliegh Ip, we can proceed with Kyprolis unless platelets are less than 10K. Proceed with treatment today.   Kelli Hurley tolerated Kyprolis without incident or complaint. VSS upon completion of treatment. Discharged in satisfactory condition with follow up instructions.

## 2019-05-15 LAB — KAPPA/LAMBDA LIGHT CHAINS
Kappa free light chain: 41.7 mg/L — ABNORMAL HIGH (ref 3.3–19.4)
Kappa, lambda light chain ratio: 1.2 (ref 0.26–1.65)
Lambda free light chains: 34.7 mg/L — ABNORMAL HIGH (ref 5.7–26.3)

## 2019-05-16 ENCOUNTER — Other Ambulatory Visit: Payer: Self-pay

## 2019-05-16 ENCOUNTER — Other Ambulatory Visit (HOSPITAL_COMMUNITY)
Admission: RE | Admit: 2019-05-16 | Discharge: 2019-05-16 | Disposition: A | Discharge status: Home / Self Care | Payer: Medicare Other | Source: Ambulatory Visit | Attending: Hematology | Admitting: Hematology

## 2019-05-16 DIAGNOSIS — Z20822 Contact with and (suspected) exposure to covid-19: Secondary | ICD-10-CM | POA: Diagnosis not present

## 2019-05-16 DIAGNOSIS — Z01812 Encounter for preprocedural laboratory examination: Secondary | ICD-10-CM | POA: Diagnosis present

## 2019-05-16 LAB — PROTEIN ELECTROPHORESIS, SERUM
A/G Ratio: 1 (ref 0.7–1.7)
Albumin ELP: 3.3 g/dL (ref 2.9–4.4)
Alpha-1-Globulin: 0.4 g/dL (ref 0.0–0.4)
Alpha-2-Globulin: 0.8 g/dL (ref 0.4–1.0)
Beta Globulin: 1 g/dL (ref 0.7–1.3)
Gamma Globulin: 1 g/dL (ref 0.4–1.8)
Globulin, Total: 3.2 g/dL (ref 2.2–3.9)
M-Spike, %: 0.4 g/dL — ABNORMAL HIGH
Total Protein ELP: 6.5 g/dL (ref 6.0–8.5)

## 2019-05-17 LAB — SARS CORONAVIRUS 2 (TAT 6-24 HRS): SARS Coronavirus 2: NEGATIVE

## 2019-05-20 ENCOUNTER — Other Ambulatory Visit: Payer: Self-pay

## 2019-05-20 ENCOUNTER — Ambulatory Visit (HOSPITAL_COMMUNITY)
Admission: RE | Admit: 2019-05-20 | Discharge: 2019-05-20 | Disposition: A | Discharge status: Home / Self Care | Payer: Medicare Other | Source: Ambulatory Visit | Attending: Hematology | Admitting: Hematology

## 2019-05-20 DIAGNOSIS — R0602 Shortness of breath: Secondary | ICD-10-CM | POA: Diagnosis present

## 2019-05-20 LAB — PULMONARY FUNCTION TEST
DL/VA % pred: 85 %
DL/VA: 3.66 ml/min/mmHg/L
DLCO unc % pred: 62 %
DLCO unc: 11.73 ml/min/mmHg
FEF 25-75 Post: 2.66 L/sec
FEF 25-75 Pre: 2.89 L/sec
FEF2575-%Change-Post: -8 %
FEF2575-%Pred-Post: 143 %
FEF2575-%Pred-Pre: 155 %
FEV1-%Change-Post: 6 %
FEV1-%Pred-Post: 103 %
FEV1-%Pred-Pre: 97 %
FEV1-Post: 1.92 L
FEV1-Pre: 1.81 L
FEV1FVC-%Change-Post: 0 %
FEV1FVC-%Pred-Pre: 114 %
FEV6-%Change-Post: 5 %
FEV6-%Pred-Post: 92 %
FEV6-%Pred-Pre: 87 %
FEV6-Post: 2.13 L
FEV6-Pre: 2.01 L
FEV6FVC-%Pred-Post: 103 %
FEV6FVC-%Pred-Pre: 103 %
FVC-%Change-Post: 5 %
FVC-%Pred-Post: 89 %
FVC-%Pred-Pre: 84 %
FVC-Post: 2.13 L
FVC-Pre: 2.01 L
Post FEV1/FVC ratio: 91 %
Post FEV6/FVC ratio: 100 %
Pre FEV1/FVC ratio: 90 %
Pre FEV6/FVC Ratio: 100 %

## 2019-05-20 MED ORDER — ALBUTEROL SULFATE (2.5 MG/3ML) 0.083% IN NEBU
2.5000 mg | INHALATION_SOLUTION | Freq: Once | RESPIRATORY_TRACT | Status: AC
  Administered 2019-05-20: 2.5 mg via RESPIRATORY_TRACT

## 2019-05-21 ENCOUNTER — Other Ambulatory Visit (HOSPITAL_COMMUNITY): Payer: Self-pay | Admitting: Surgery

## 2019-05-21 ENCOUNTER — Telehealth (HOSPITAL_COMMUNITY): Payer: Self-pay | Admitting: *Deleted

## 2019-05-21 MED ORDER — DEXAMETHASONE 4 MG PO TABS: 40.0000 mg | ORAL_TABLET | ORAL | 3 refills | Status: DC

## 2019-05-22 ENCOUNTER — Other Ambulatory Visit (HOSPITAL_COMMUNITY): Payer: Self-pay | Admitting: *Deleted

## 2019-05-22 MED ORDER — POMALIDOMIDE 4 MG PO CAPS: 4.0000 mg | ORAL_CAPSULE | Freq: Every day | ORAL | 0 refills | Status: DC

## 2019-05-22 NOTE — Telephone Encounter (Signed)
Chart reviewed, pomalyst refilled.  

## 2019-05-22 NOTE — Progress Notes (Signed)
.  Pharmacist Chemotherapy Monitoring - Follow Up Assessment    I verify that I have reviewed each item in the below checklist:  . Regimen for the patient is scheduled for the appropriate day and plan matches scheduled date. Marland Kitchen Appropriate non-routine labs are ordered dependent on drug ordered. . If applicable, additional medications reviewed and ordered per protocol based on lifetime cumulative doses and/or treatment regimen.   Plan for follow-up and/or issues identified: No . I-vent associated with next due treatment: No . MD and/or nursing notified: No  MARLIA FOWLIE 05/22/2019 2:20 PM

## 2019-05-28 ENCOUNTER — Inpatient Hospital Stay (HOSPITAL_COMMUNITY): Payer: Medicare Other

## 2019-05-28 ENCOUNTER — Inpatient Hospital Stay (HOSPITAL_BASED_OUTPATIENT_CLINIC_OR_DEPARTMENT_OTHER): Payer: Medicare Other | Admitting: Hematology

## 2019-05-28 ENCOUNTER — Other Ambulatory Visit: Payer: Self-pay

## 2019-05-28 VITALS — BP 141/75 | HR 80 | Temp 96.9°F | Resp 20 | Wt 348.2 lb

## 2019-05-28 VITALS — BP 112/62 | HR 77 | Temp 97.1°F | Resp 20

## 2019-05-28 DIAGNOSIS — Z5112 Encounter for antineoplastic immunotherapy: Secondary | ICD-10-CM | POA: Diagnosis not present

## 2019-05-28 DIAGNOSIS — C9000 Multiple myeloma not having achieved remission: Secondary | ICD-10-CM | POA: Diagnosis not present

## 2019-05-28 DIAGNOSIS — Z780 Asymptomatic menopausal state: Secondary | ICD-10-CM

## 2019-05-28 DIAGNOSIS — C9001 Multiple myeloma in remission: Secondary | ICD-10-CM

## 2019-05-28 LAB — CBC WITH DIFFERENTIAL/PLATELET
Basophils Absolute: 0 10*3/uL (ref 0.0–0.1)
Basophils Relative: 0 %
Eosinophils Absolute: 0.1 10*3/uL (ref 0.0–0.5)
Eosinophils Relative: 3 %
HCT: 33 % — ABNORMAL LOW (ref 36.0–46.0)
Hemoglobin: 10.5 g/dL — ABNORMAL LOW (ref 12.0–15.0)
Lymphocytes Relative: 27 %
Lymphs Abs: 1.1 10*3/uL (ref 0.7–4.0)
MCH: 30.8 pg (ref 26.0–34.0)
MCHC: 31.8 g/dL (ref 30.0–36.0)
MCV: 96.8 fL (ref 80.0–100.0)
Monocytes Absolute: 0.4 10*3/uL (ref 0.1–1.0)
Monocytes Relative: 11 %
Neutro Abs: 2.4 10*3/uL (ref 1.7–7.7)
Neutrophils Relative %: 59 %
Platelets: 217 10*3/uL (ref 150–400)
RBC: 3.41 MIL/uL — ABNORMAL LOW (ref 3.87–5.11)
RDW: 15.8 % — ABNORMAL HIGH (ref 11.5–15.5)
WBC: 4 10*3/uL (ref 4.0–10.5)
nRBC: 0 % (ref 0.0–0.2)

## 2019-05-28 LAB — COMPREHENSIVE METABOLIC PANEL
ALT: 15 U/L (ref 0–44)
AST: 16 U/L (ref 15–41)
Albumin: 3.6 g/dL (ref 3.5–5.0)
Alkaline Phosphatase: 52 U/L (ref 38–126)
Anion gap: 10 (ref 5–15)
BUN: 23 mg/dL (ref 8–23)
CO2: 28 mmol/L (ref 22–32)
Calcium: 9.2 mg/dL (ref 8.9–10.3)
Chloride: 99 mmol/L (ref 98–111)
Creatinine, Ser: 1.01 mg/dL — ABNORMAL HIGH (ref 0.44–1.00)
GFR calc Af Amer: >60 mL/min (ref >60)
GFR calc non Af Amer: 60 mL/min — ABNORMAL LOW (ref >60)
Glucose, Bld: 98 mg/dL (ref 70–99)
Potassium: 3.5 mmol/L (ref 3.5–5.1)
Sodium: 137 mmol/L (ref 135–145)
Total Bilirubin: 0.8 mg/dL (ref 0.3–1.2)
Total Protein: 6.7 g/dL (ref 6.5–8.1)

## 2019-05-28 MED ORDER — SODIUM CHLORIDE 0.9% FLUSH
10.0000 mL | INTRAVENOUS | Status: DC | PRN
  Administered 2019-05-28: 13:00:00 10 mL

## 2019-05-28 MED ORDER — SODIUM CHLORIDE 0.9 % IV SOLN
40.0000 mg | Freq: Once | INTRAVENOUS | Status: AC
  Administered 2019-05-28: 12:00:00 40 mg via INTRAVENOUS
  Filled 2019-05-28: qty 4

## 2019-05-28 MED ORDER — DEXTROSE 5 % IV SOLN
54.5000 mg/m2 | Freq: Once | INTRAVENOUS | Status: AC
  Administered 2019-05-28: 13:00:00 120 mg via INTRAVENOUS
  Filled 2019-05-28: qty 60

## 2019-05-28 MED ORDER — SODIUM CHLORIDE 0.9 % IV SOLN: Freq: Once | INTRAVENOUS | Status: AC

## 2019-05-28 MED ORDER — HEPARIN SOD (PORK) LOCK FLUSH 100 UNIT/ML IV SOLN
500.0000 [IU] | Freq: Once | INTRAVENOUS | Status: AC | PRN
  Administered 2019-05-28: 13:00:00 500 [IU]

## 2019-05-28 MED ORDER — ZOLEDRONIC ACID 4 MG/100ML IV SOLN
4.0000 mg | Freq: Once | INTRAVENOUS | Status: AC
  Administered 2019-05-28: 12:00:00 4 mg via INTRAVENOUS
  Filled 2019-05-28: qty 100

## 2019-05-28 NOTE — Progress Notes (Signed)
Kelli Hurley, [place] [address]   CLINIC:  Medical Oncology/Hematology  PCP:  Kelli Richard, MD [address] [place] [address] [phone]   REASON FOR VISIT:  Follow-up for multiple myeloma   BRIEF ONCOLOGIC HISTORY:  Oncology History  Multiple myeloma ([place])  08/07/2014 Imaging   Bone Survey- No lytic lesions are noted in the visualized skeleton.   09/03/2014 Bone Marrow Biopsy   NORMOCELLULAR BONE MARROW FOR AGE WITH PLASMA CELL NEOPLASM.  The plasma cell component is increased in the marrow representing an estimated 18% of all cells. Cytogenetics with 13q-, 17p- (high risk disease)   09/03/2014 Pathology Results   Cytogenetics with 13q-, 17p- (high risk disease)   09/23/2014 Initial Diagnosis   Multiple myeloma   09/30/2014 PET scan   No abnormal hypermetabolism in the neck, chest, abdomen or pelvis.   10/05/2014 - 03/22/2015 Chemotherapy   RVD   10/28/2014 Treatment Plan Change   Issues related to getting Revlimid in a timely fashion, therefore, she received her Revlimid on 9/21 resulting in a 12 day cycle this time instead of a 14 day cycle   11/02/2014 Imaging   CTA chest- No evidence for a large or central pulmonary embolism as described.  8 mm density along the right minor fissure could represent focal pleural thickening but indeterminate. If the patient is at high risk for bronchogenic carcinoma, follow-up c   11/23/2014 Miscellaneous   Zometa 4 mg IV monthly   04/07/2015 Bone Marrow Biopsy   Normocellular marrow with 2-4% clonal plasma cells by immunohistochemistry. FISH and cytogenetics were normal [facility])     04/2015 Miscellaneous   PRETRANSPLANT EVALUATION:  Pulmonary function tests: FEV1 100.3% / DLCO 97.9%  Echocardiogram: Normal LV function with EF 60-65%    04/27/2015 Procedure   Stem cell mobilization with filgrastim and Mozobil [facility])   05/06/2015 Miscellaneous   BMT conditioning regimen with high-dose  Melphalan given [name]); Day -1   05/07/2015 Bone Marrow Transplant   Outpatient autologous stem cell transplant [facility] [name]); Day 0   05/18/2015 Miscellaneous   WBC engraftment;  did not require platelet transfusion during her transplant process. Lowest platelet count 28,000    05/20/2015 Procedure   Tunneled catheter removed [facility])   09/08/2015 -  Chemotherapy   Velcade every 2 weeks   12/15/2015 Miscellaneous   Zometa re-instituted.    12/23/2015 Imaging   Bone density- BMD as determined from Forearm Radius 33% is 0.799 g/cm2 with a T-Score of 1.2. This patient is considered normal according to [facility]) criteria.   04/17/2016 Miscellaneous   Started maintenance with N[place]o.   05/04/2016 Treatment Plan Change   Zometa switched to Xgeva injection monthly given difficult IV access.    03/05/2019 -  Chemotherapy   The patient had carfilzomib (KYPROLIS) 40 mg in dextrose 5 % 50 mL chemo infusion, 18 mg/m2 = 44 mg, Intravenous,  Once, 4 of 5 cycles Dose modification: 56 mg/m2 (original dose 56 mg/m2, Cycle 1, Reason: Provider Judgment) Administration: 40 mg (03/05/2019), 120 mg (03/12/2019), 120 mg (03/20/2019), 120 mg (04/02/2019), 120 mg (04/09/2019), 120 mg (04/16/2019), 120 mg (04/30/2019), 120 mg (05/08/2019), 120 mg (05/14/2019), 120 mg (05/28/2019)  for chemotherapy treatment.       CANCER STAGING: Cancer Staging Multiple myeloma [facility]) Staging form: Multiple Myeloma, AJCC 6th Edition - Clinical stage from 10/26/2014: Stage IIA - Signed by Baird Cancer, PA-C on 10/26/2014 - Pathologic: No stage  assigned - Unsigned    INTERVAL HISTORY:  [name] 64 y.o. female seen for follow-up of multiple myeloma and toxicity assessment prior to cycle 4 of chemotherapy.  Appetite is 100%.  Energy levels are 50%.  Pain in the knees has been stable.  Numbness in the right hand is also stable.  Reports shortness of breath is slightly better.   She is using inhalers twice daily.  REVIEW OF SYSTEMS:  Review of Systems  Respiratory: Positive for shortness of breath.   Musculoskeletal: Positive for arthralgias.  Neurological: Positive for numbness.  All other systems reviewed and are negative.    PAST MEDICAL/SURGICAL HISTORY:  Past Medical History:  Diagnosis Date  . Anemia   . Arthritis   . Cancer ([place])   . Claustrophobia 10/05/2014  . Hypertension   . Leukopenia 08/03/2014  . Normocytic hypochromic anemia 08/03/2014  . Renal disorder    stage 3    Past Surgical History:  Procedure Laterality Date  . ABDOMINAL HYSTERECTOMY    . CARDIAC SURGERY    . OTHER SURGICAL HISTORY     heart surgery as infant to "repair hole in heart"  . PORTACATH PLACEMENT Left 02/21/2019   Procedure: INSERTION PORT-A-CATH;  Surgeon: Virl Cagey, MD;  Location: [facility];  Service: General;  Laterality: Left;     SOCIAL HISTORY:  Social History   Socioeconomic History  . Marital status: Legally Separated    Spouse name: Not on file  . Number of children: Not on file  . Years of education: Not on file  . Highest education level: Not on file  Occupational History  . Not on file  Tobacco Use  . Smoking status: Never Smoker  . Smokeless tobacco: Never Used  Substance and Sexual Activity  . Alcohol use: No  . Drug use: No  . Sexual activity: Not on file    Comment: divorced- 2 daughters  Other Topics Concern  . Not on file  Social History Narrative  . Not on file   Social Determinants of Health   Financial Resource Strain:   . Difficulty of Paying Living Expenses:   Food Insecurity:   . Worried About Charity fundraiser in the Last Year:   . Arboriculturist in the Last Year:   Transportation Needs:   . Film/video editor (Medical):   Marland Kitchen Lack of Transportation (Non-Medical):   Physical Activity:   . Days of Exercise per Week:   . Minutes of Exercise per Session:   Stress:   . Feeling of Stress :   Social  Connections:   . Frequency of Communication with Friends and Family:   . Frequency of Social Gatherings with Friends and Family:   . Attends Religious Services:   . Active Member of Clubs or Organizations:   . Attends Archivist Meetings:   Marland Kitchen Marital Status:   Intimate Partner Violence:   . Fear of Current or Ex-Partner:   . Emotionally Abused:   Marland Kitchen Physically Abused:   . Sexually Abused:     FAMILY HISTORY:  Family History  Problem Relation Age of Onset  . Cancer Mother   . Hypertension Mother   . Cancer Father   . Hypertension Father   . Cancer Maternal Grandmother   . Hypertension Sister   . Hypertension Brother     CURRENT MEDICATIONS:  Outpatient Encounter Medications as of 05/28/2019  Medication Sig  . acyclovir (ZOVIRAX) 400 MG tablet Take 1 tablet (400  mg total) by mouth 2 (two) times daily.  . Ascorbic Acid (VITAMIN C) 1000 MG tablet Take 1,000 mg by mouth daily.   Marland Kitchen aspirin EC 81 MG tablet Take 81 mg by mouth daily.  . calcium carbonate (OS-CAL) 1250 (500 Ca) MG chewable tablet   . CARFILZOMIB IV Inject into the vein.  . cholecalciferol (VITAMIN D) 1000 units tablet Take 1,000 Units by mouth daily.  Marland Kitchen dexamethasone (DECADRON) 4 MG tablet Take 10 tablets (40 mg total) by mouth once a week. On the week you are off of treatment  . gabapentin (NEURONTIN) 300 MG capsule Take 1 capsule (300 mg total) by mouth 3 (three) times daily. (Patient taking differently: Take 300 mg by mouth 2 (two) times daily. )  . hydrochlorothiazide (HYDRODIURIL) 25 MG tablet TAKE 1 TABLET BY MOUTH ONCE DAILY. (Patient taking differently: Take 25 mg by mouth daily. )  . lidocaine-prilocaine (EMLA) cream Apply a quarter sized glob 1 hour prior to treatment.  . magnesium oxide (MAG-OX) 400 (241.3 Mg) MG tablet Take 1 tablet (400 mg total) by mouth 2 (two) times daily.  . Multiple Vitamin (TAB-A-VITE) TABS TAKE 1 TABLET BY MOUTH ONCE DAILY. (Patient taking differently: Take 1 tablet by mouth  daily. )  . pomalidomide (POMALYST) 4 MG capsule Take 1 capsule (4 mg total) by mouth daily.  Marland Kitchen trolamine salicylate (ASPERCREME) 10 % cream Apply 1 application topically 2 (two) times daily.  Marland Kitchen acetaminophen (TYLENOL 8 HOUR ARTHRITIS PAIN) 650 MG CR tablet Take 650 mg by mouth every 8 (eight) hours as needed for pain.  Marland Kitchen albuterol (VENTOLIN HFA) 108 (90 Base) MCG/ACT inhaler Inhale 2 puffs into the lungs every 6 (six) hours as needed for wheezing or shortness of breath. (Patient not taking: Reported on 02/26/2019)  . ibuprofen (ADVIL) 400 MG tablet Take 1 tablet (400 mg total) by mouth every 8 (eight) hours as needed. (Patient not taking: Reported on 03/05/2019)  . LORazepam (ATIVAN) 1 MG tablet   . prochlorperazine (COMPAZINE) 10 MG tablet Take 1 tablet (10 mg total) by mouth every 6 (six) hours as needed for nausea or vomiting. (Patient not taking: Reported on 02/13/2019)  . [DISCONTINUED] magnesium oxide (MAG-OX) 400 MG tablet Take by mouth.   Facility-Administered Encounter Medications as of 05/28/2019  Medication  . heparin lock flush 100 unit/mL  . sodium chloride 0.9 % injection 10 mL    ALLERGIES:  Allergies  Allergen Reactions  . Diclofenac Swelling    Per pt, facial swelling     PHYSICAL EXAM:  ECOG Performance status: 1  Vitals:   05/28/19 0913  BP: (!) 141/75  Pulse: 80  Resp: 20  Temp: (!) 96.9 F (36.1 C)  SpO2: 97%   Filed Weights   05/28/19 0913  Weight: (!) 348 lb 3.2 oz (157.9 kg)    Physical Exam Vitals reviewed.  Constitutional:      Appearance: Normal appearance.  Cardiovascular:     Rate and Rhythm: Normal rate and regular rhythm.     Heart sounds: Normal heart sounds.  Pulmonary:     Effort: Pulmonary effort is normal.     Breath sounds: Normal breath sounds.  Abdominal:     General: There is no distension.     Palpations: Abdomen is soft. There is no mass.  Musculoskeletal:        General: No swelling.  Skin:    General: Skin is warm.    Neurological:     General: No focal deficit present.  Mental Status: She is alert and oriented to person, place, and time.  Psychiatric:        Mood and Affect: Mood normal.        Behavior: Behavior normal.      LABORATORY DATA:  I have reviewed the labs as listed.  CBC    Component Value Date/Time   WBC 4.0 05/28/2019 0925   RBC 3.41 (L) 05/28/2019 0925   HGB 10.5 (L) 05/28/2019 0925   HCT 33.0 (L) 05/28/2019 0925   PLT 217 05/28/2019 0925   MCV 96.8 05/28/2019 0925   MCH 30.8 05/28/2019 0925   MCHC 31.8 05/28/2019 0925   RDW 15.8 (H) 05/28/2019 0925   LYMPHSABS 1.1 05/28/2019 0925   MONOABS 0.4 05/28/2019 0925   EOSABS 0.1 05/28/2019 0925   BASOSABS 0.0 05/28/2019 0925   CMP Latest Ref Rng & Units 05/28/2019 05/14/2019 05/08/2019  Glucose 70 - 99 mg/dL 98 112(H) 115(H)  BUN 8 - 23 mg/dL 23 27(H) 26(H)  Creatinine 0.44 - 1.00 mg/dL 1.01(H) 1.03(H) 1.12(H)  Sodium 135 - 145 mmol/L 137 137 136  Potassium 3.5 - 5.1 mmol/L 3.5 3.5 3.3(L)  Chloride 98 - 111 mmol/L 99 99 97(L)  CO2 22 - 32 mmol/L _0 Calcium 8.9 - 10.3 mg/dL 9.2 9.7 9.2  Total Protein 6.5 - 8.1 g/dL 6.7 7.2 7.3  Total Bilirubin 0.3 - 1.2 mg/dL 0.8 1.0 0.7  Alkaline Phos 38 - 126 U/L 52 50 58  AST 15 - 41 U/L _1 ALT 0 - 44 U/L _2 DIAGNOSTIC IMAGING:  I have reviewed scans.   I have reviewed Venita Lick LPN's note and agree with the documentation.  I personally performed a face-to-face visit, made revisions and my assessment and plan is as follows.    ASSESSMENT & PLAN:   Multiple myeloma ([place]) 1.  Relapsed IgG kappa multiple myeloma with high risk features: -3 cycles of carfilzomib, pomalidomide and dexamethasone from 03/05/2019 through 04/30/2019. -Myeloma panel from 05/14/2019 shows M spike improved to 0.4 g, 0.5 g previously and 1.1 g pretreatment.  Light chain ratio improved to 1.20.  Previously 2.69. -She will proceed with cycle 4 today.  I have reviewed her labs  including LFTs. -She will come back in 4 weeks for follow-up.  We will set up for a bone density test prior to next visit.  We will also make referral for colonoscopy.  Last colonoscopy was in 2011 by Dr. Oneida Alar.  2.  Shortness of breath on exertion: -2D echo on 02/09/2019 shows EF 60-65%.  No LVH.  Chest CT PE protocol on 02/24/2019 was negative. -Cardiac MRI on 03/07/2019 shows LVEF 56% with no amyloid.  Troponin T and proBNP were negative. -Pulmonary function tests show low expiratory reserve volume consistent with body habitus.  Moderate diffusion defect which corrects to normal.  FVC, FEV1, FEV1/FVC ratio are within normal limits.  FVC is reduced relative to the SVC indicates air trapping.  No significant response after bronchodilators.  Reduced diffusing capacity indicates moderate loss of functional alveolar capillary space. -She is using albuterol inhaler twice daily.  She reports improvement in her breathing.  If not we will make a referral to pulmonary.  3.  ID prophylaxis: -We will continue 400 mg acyclovir twice daily.  Continue aspirin 81 mg for thromboprophylaxis.  4.  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  To continue gabapentin 300 mg 3 times daily.  5.  Lower extremity edema: -Trace edema in the legs is stable.  Continue HCTZ 25 mg daily.  6.  Myeloma bone disease: -We talked about starting her back on zoledronic acid.   Orders placed this encounter:  Orders Placed This Encounter  Procedures  . DG Bone Density      Derek Jack, MD [place] [phone]

## 2019-05-28 NOTE — Progress Notes (Signed)
Patient has been assessed, vital signs and labs have been reviewed by Dr. Delton Coombes. ANC, Creatinine, LFTs, and Platelets are within treatment parameters per Dr. Delton Coombes. The patient is good to proceed with treatment at this time. Please restart Zometa today if possible per Dr. Delton Coombes.

## 2019-05-28 NOTE — Progress Notes (Signed)
Patient presents today for treatment and follow up visit with Dr. Delton Coombes. Labs pending. Vital signs within parameters for treatment. Patient has no complaints of any significant changes since her last visit.   Message received from [facility] LPN/ Dr. Delton Coombes. Proceed with treatment today. Restart Zometa monthly today per MD/ ATravis LPN. Per Sharilyn Sites no authorization required.   Treatment given today per MD orders. Tolerated infusion without adverse affects. Vital signs stable. No complaints at this time. Discharged from clinic via wheel chair.  F/U with [facility] as scheduled.

## 2019-05-28 NOTE — Patient Instructions (Addendum)
[place] at [facility] Discharge Instructions  You were seen today by Dr. Delton Coombes. He went over your recent lab results. He will refer you to GI for colonoscopy. He will schedule you for a Bone density test prior to your next visit. He will see you back in 4 weeks for labs and follow up.   Thank you for choosing [place] at [facility] to provide your oncology and hematology care.  To afford each patient quality time with our provider, please arrive at least 15 minutes before your scheduled appointment time.   If you have a lab appointment with the [place] please come in thru the  Main Entrance and check in at the main information desk  You need to re-schedule your appointment should you arrive 10 or more minutes late.  We strive to give you quality time with our providers, and arriving late affects you and other patients whose appointments are after yours.  Also, if you no show three or more times for appointments you may be dismissed from the clinic at the providers discretion.     Again, thank you for choosing [facility].  Our hope is that these requests will decrease the amount of time that you wait before being seen by our physicians.       _____________________________________________________________  Should you have questions after your visit to [facility], please contact our office at [phone] between the hours of 8:00 a.m. and 4:30 p.m.  Voicemails left after 4:00 p.m. will not be returned until the following business day.  For prescription refill requests, have your pharmacy contact our office and allow 72 hours.    Cancer Center Support Programs:   > Cancer Support Group  2nd Tuesday of the month 1pm-2pm, Journey Room

## 2019-05-28 NOTE — Patient Instructions (Signed)
[facility] Discharge Instructions for Patients Receiving Chemotherapy  Today you received the following chemotherapy agents   To help prevent nausea and vomiting after your treatment, we encourage you to take your nausea medication   If you develop nausea and vomiting that is not controlled by your nausea medication, call the clinic.   BELOW ARE SYMPTOMS THAT SHOULD BE REPORTED IMMEDIATELY:  *FEVER GREATER THAN 100.5 F  *CHILLS WITH OR WITHOUT FEVER  NAUSEA AND VOMITING THAT IS NOT CONTROLLED WITH YOUR NAUSEA MEDICATION  *UNUSUAL SHORTNESS OF BREATH  *UNUSUAL BRUISING OR BLEEDING  TENDERNESS IN MOUTH AND THROAT WITH OR WITHOUT PRESENCE OF ULCERS  *URINARY PROBLEMS  *BOWEL PROBLEMS  UNUSUAL RASH Items with * indicate a potential emergency and should be followed up as soon as possible.  Feel free to call the clinic should you have any questions or concerns. The clinic phone number is [phone].  Please show the CHEMO ALERT CARD at check-in to the Emergency Department and triage nurse.   

## 2019-05-28 NOTE — Assessment & Plan Note (Addendum)
1.  Relapsed IgG kappa multiple myeloma with high risk features: -3 cycles of carfilzomib, pomalidomide and dexamethasone from 03/05/2019 through 04/30/2019. -Myeloma panel from 05/14/2019 shows M spike improved to 0.4 g, 0.5 g previously and 1.1 g pretreatment.  Light chain ratio improved to 1.20.  Previously 2.69. -She will proceed with cycle 4 today.  I have reviewed her labs including LFTs. -She will come back in 4 weeks for follow-up.  We will set up for a bone density test prior to next visit.  We will also make referral for colonoscopy.  Last colonoscopy was in 2011 by Dr. Oneida Alar.  2.  Shortness of breath on exertion: -2D echo on 02/09/2019 shows EF 60-65%.  No LVH.  Chest CT PE protocol on 02/24/2019 was negative. -Cardiac MRI on 03/07/2019 shows LVEF 56% with no amyloid.  Troponin T and proBNP were negative. -Pulmonary function tests show low expiratory reserve volume consistent with body habitus.  Moderate diffusion defect which corrects to normal.  FVC, FEV1, FEV1/FVC ratio are within normal limits.  FVC is reduced relative to the SVC indicates air trapping.  No significant response after bronchodilators.  Reduced diffusing capacity indicates moderate loss of functional alveolar capillary space. -She is using albuterol inhaler twice daily.  She reports improvement in her breathing.  If not we will make a referral to pulmonary.  3.  ID prophylaxis: -We will continue 400 mg acyclovir twice daily.  Continue aspirin 81 mg for thromboprophylaxis.  4.  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  To continue gabapentin 300 mg 3 times daily.  5.  Lower extremity edema: -Trace edema in the legs is stable.  Continue HCTZ 25 mg daily.  6.  Myeloma bone disease: -We talked about starting her back on zoledronic acid.

## 2019-05-29 ENCOUNTER — Encounter: Payer: Self-pay | Admitting: Internal Medicine

## 2019-05-30 NOTE — Progress Notes (Signed)
.  Pharmacist Chemotherapy Monitoring - Follow Up Assessment    I verify that I have reviewed each item in the below checklist:  . Regimen for the patient is scheduled for the appropriate day and plan matches scheduled date. Marland Kitchen Appropriate non-routine labs are ordered dependent on drug ordered. . If applicable, additional medications reviewed and ordered per protocol based on lifetime cumulative doses and/or treatment regimen.   Plan for follow-up and/or issues identified: No . I-vent associated with next due treatment: No . MD and/or nursing notified: No  MARLA DELGIUDICE 05/30/2019 9:02 AM

## 2019-06-04 ENCOUNTER — Inpatient Hospital Stay (HOSPITAL_COMMUNITY): Payer: Medicare Other

## 2019-06-04 ENCOUNTER — Other Ambulatory Visit: Payer: Self-pay

## 2019-06-04 VITALS — BP 121/69 | HR 84 | Temp 97.3°F | Resp 20 | Wt 349.0 lb

## 2019-06-04 DIAGNOSIS — C9000 Multiple myeloma not having achieved remission: Secondary | ICD-10-CM

## 2019-06-04 DIAGNOSIS — Z5112 Encounter for antineoplastic immunotherapy: Secondary | ICD-10-CM | POA: Diagnosis not present

## 2019-06-04 LAB — CBC WITH DIFFERENTIAL/PLATELET
Abs Immature Granulocytes: 0.29 10*3/uL — ABNORMAL HIGH (ref 0.00–0.07)
Basophils Absolute: 0 10*3/uL (ref 0.0–0.1)
Basophils Relative: 1 %
Eosinophils Absolute: 0.2 10*3/uL (ref 0.0–0.5)
Eosinophils Relative: 3 %
HCT: 35.2 % — ABNORMAL LOW (ref 36.0–46.0)
Hemoglobin: 11 g/dL — ABNORMAL LOW (ref 12.0–15.0)
Immature Granulocytes: 5 %
Lymphocytes Relative: 15 %
Lymphs Abs: 1 10*3/uL (ref 0.7–4.0)
MCH: 30.2 pg (ref 26.0–34.0)
MCHC: 31.3 g/dL (ref 30.0–36.0)
MCV: 96.7 fL (ref 80.0–100.0)
Monocytes Absolute: 0.2 10*3/uL (ref 0.1–1.0)
Monocytes Relative: 3 %
Neutro Abs: 4.8 10*3/uL (ref 1.7–7.7)
Neutrophils Relative %: 73 %
Platelets: 152 10*3/uL (ref 150–400)
RBC: 3.64 MIL/uL — ABNORMAL LOW (ref 3.87–5.11)
RDW: 16.1 % — ABNORMAL HIGH (ref 11.5–15.5)
WBC: 6.5 10*3/uL (ref 4.0–10.5)
nRBC: 0 % (ref 0.0–0.2)

## 2019-06-04 LAB — COMPREHENSIVE METABOLIC PANEL
ALT: 17 U/L (ref 0–44)
AST: 19 U/L (ref 15–41)
Albumin: 3.8 g/dL (ref 3.5–5.0)
Alkaline Phosphatase: 59 U/L (ref 38–126)
Anion gap: 14 (ref 5–15)
BUN: 22 mg/dL (ref 8–23)
CO2: 26 mmol/L (ref 22–32)
Calcium: 9.3 mg/dL (ref 8.9–10.3)
Chloride: 99 mmol/L (ref 98–111)
Creatinine, Ser: 1.04 mg/dL — ABNORMAL HIGH (ref 0.44–1.00)
GFR calc Af Amer: >60 mL/min (ref >60)
GFR calc non Af Amer: 58 mL/min — ABNORMAL LOW (ref >60)
Glucose, Bld: 115 mg/dL — ABNORMAL HIGH (ref 70–99)
Potassium: 3.4 mmol/L — ABNORMAL LOW (ref 3.5–5.1)
Sodium: 139 mmol/L (ref 135–145)
Total Bilirubin: 1 mg/dL (ref 0.3–1.2)
Total Protein: 6.9 g/dL (ref 6.5–8.1)

## 2019-06-04 LAB — LACTATE DEHYDROGENASE: LDH: 277 U/L — ABNORMAL HIGH (ref 98–192)

## 2019-06-04 MED ORDER — SODIUM CHLORIDE 0.9 % IV SOLN: Freq: Once | INTRAVENOUS | Status: AC

## 2019-06-04 MED ORDER — SODIUM CHLORIDE 0.9% FLUSH
10.0000 mL | INTRAVENOUS | Status: DC | PRN
  Administered 2019-06-04: 10 mL

## 2019-06-04 MED ORDER — SODIUM CHLORIDE 0.9 % IV SOLN
40.0000 mg | Freq: Once | INTRAVENOUS | Status: AC
  Administered 2019-06-04: 09:00:00 40 mg via INTRAVENOUS
  Filled 2019-06-04: qty 4

## 2019-06-04 MED ORDER — DEXTROSE 5 % IV SOLN
54.5000 mg/m2 | Freq: Once | INTRAVENOUS | Status: AC
  Administered 2019-06-04: 10:00:00 120 mg via INTRAVENOUS
  Filled 2019-06-04: qty 60

## 2019-06-04 MED ORDER — HEPARIN SOD (PORK) LOCK FLUSH 100 UNIT/ML IV SOLN
500.0000 [IU] | Freq: Once | INTRAVENOUS | Status: AC | PRN
  Administered 2019-06-04: 10:00:00 500 [IU]

## 2019-06-04 NOTE — Patient Instructions (Signed)
[facility] Discharge Instructions for Patients Receiving Chemotherapy   Beginning January 23rd 2017 lab work for the Cancer Center will be done in the  Main lab at [place] on 1st floor. If you have a lab appointment with the Cancer Center please come in thru the  Main Entrance and check in at the main information desk   Today you received the following chemotherapy agents Kyprolis  To help prevent nausea and vomiting after your treatment, we encourage you to take your nausea medication If you develop nausea and vomiting, or diarrhea that is not controlled by your medication, call the clinic.  The clinic phone number is [phone]. Office hours are Monday-Friday 8:30am-5:00pm.  BELOW ARE SYMPTOMS THAT SHOULD BE REPORTED IMMEDIATELY:  *FEVER GREATER THAN 101.0 F  *CHILLS WITH OR WITHOUT FEVER  NAUSEA AND VOMITING THAT IS NOT CONTROLLED WITH YOUR NAUSEA MEDICATION  *UNUSUAL SHORTNESS OF BREATH  *UNUSUAL BRUISING OR BLEEDING  TENDERNESS IN MOUTH AND THROAT WITH OR WITHOUT PRESENCE OF ULCERS  *URINARY PROBLEMS  *BOWEL PROBLEMS  UNUSUAL RASH Items with * indicate a potential emergency and should be followed up as soon as possible. If you have an emergency after office hours please contact your primary care physician or go to the nearest emergency department.  Please call the clinic during office hours if you have any questions or concerns.   You may also contact the Patient Navigator at [phone] should you have any questions or need assistance in obtaining follow up care.      Resources For Cancer Patients and their Caregivers ? American Cancer Society: Can assist with transportation, wigs, general needs, runs Look Good Feel Better.        1-888-227-6333 ? Cancer Care: Provides financial assistance, online support groups, medication/co-pay assistance.  1-800-813-HOPE (4673) ? [facility] Assists [place] cancer  patients and their families through emotional , educational and financial support.  336-427-4357 ? [name] Where to apply for food stamps, Medicaid and utility assistance. 336-342-1394 ? [name]: Transportation to medical appointments. 336-347-2287 ? Social Security Administration: May apply for disability if have a Stage IV cancer. 336-342-7796 1-800-772-1213 ? [place] Aging, Disability and Transit Services: Assists with nutrition, care and transit needs. 336-349-2343          

## 2019-06-04 NOTE — Progress Notes (Signed)
Kelli Hurley presents today for [employer]. Pt reports no changes since her treatment last week. Vital signs and lab results stable. Per parameters, proceed with treatment today.  Kyprolis infusion tolerated without incident or complaint. VSS upon completion of treatment. Port flushed and deaccessed. Discharged via wheelchair by staff in satisfactory condition with follow up instructions.

## 2019-06-05 LAB — KAPPA/LAMBDA LIGHT CHAINS
Kappa free light chain: 26.3 mg/L — ABNORMAL HIGH (ref 3.3–19.4)
Kappa, lambda light chain ratio: 1.28 (ref 0.26–1.65)
Lambda free light chains: 20.5 mg/L (ref 5.7–26.3)

## 2019-06-05 LAB — PROTEIN ELECTROPHORESIS, SERUM
A/G Ratio: 1.4 (ref 0.7–1.7)
Albumin ELP: 4.1 g/dL (ref 2.9–4.4)
Alpha-1-Globulin: 0.3 g/dL (ref 0.0–0.4)
Alpha-2-Globulin: 0.7 g/dL (ref 0.4–1.0)
Beta Globulin: 1.1 g/dL (ref 0.7–1.3)
Gamma Globulin: 0.9 g/dL (ref 0.4–1.8)
Globulin, Total: 2.9 g/dL (ref 2.2–3.9)
M-Spike, %: 0.4 g/dL — ABNORMAL HIGH
Total Protein ELP: 7 g/dL (ref 6.0–8.5)

## 2019-06-11 ENCOUNTER — Inpatient Hospital Stay (HOSPITAL_COMMUNITY): Payer: Medicare Other

## 2019-06-11 ENCOUNTER — Other Ambulatory Visit: Payer: Self-pay

## 2019-06-11 ENCOUNTER — Inpatient Hospital Stay (HOSPITAL_COMMUNITY): Payer: Medicare Other | Attending: Hematology

## 2019-06-11 ENCOUNTER — Encounter (HOSPITAL_COMMUNITY): Payer: Self-pay

## 2019-06-11 VITALS — BP 120/67 | HR 79 | Temp 97.3°F | Resp 20 | Wt 350.8 lb

## 2019-06-11 DIAGNOSIS — Z7952 Long term (current) use of systemic steroids: Secondary | ICD-10-CM | POA: Insufficient documentation

## 2019-06-11 DIAGNOSIS — R05 Cough: Secondary | ICD-10-CM | POA: Insufficient documentation

## 2019-06-11 DIAGNOSIS — R0602 Shortness of breath: Secondary | ICD-10-CM | POA: Diagnosis not present

## 2019-06-11 DIAGNOSIS — Z8249 Family history of ischemic heart disease and other diseases of the circulatory system: Secondary | ICD-10-CM | POA: Diagnosis not present

## 2019-06-11 DIAGNOSIS — R062 Wheezing: Secondary | ICD-10-CM | POA: Insufficient documentation

## 2019-06-11 DIAGNOSIS — Z79899 Other long term (current) drug therapy: Secondary | ICD-10-CM | POA: Diagnosis not present

## 2019-06-11 DIAGNOSIS — Z809 Family history of malignant neoplasm, unspecified: Secondary | ICD-10-CM | POA: Diagnosis not present

## 2019-06-11 DIAGNOSIS — R600 Localized edema: Secondary | ICD-10-CM | POA: Diagnosis not present

## 2019-06-11 DIAGNOSIS — G629 Polyneuropathy, unspecified: Secondary | ICD-10-CM | POA: Diagnosis not present

## 2019-06-11 DIAGNOSIS — M7989 Other specified soft tissue disorders: Secondary | ICD-10-CM | POA: Insufficient documentation

## 2019-06-11 DIAGNOSIS — C9000 Multiple myeloma not having achieved remission: Secondary | ICD-10-CM | POA: Diagnosis present

## 2019-06-11 DIAGNOSIS — M1990 Unspecified osteoarthritis, unspecified site: Secondary | ICD-10-CM | POA: Diagnosis not present

## 2019-06-11 DIAGNOSIS — Z5112 Encounter for antineoplastic immunotherapy: Secondary | ICD-10-CM | POA: Insufficient documentation

## 2019-06-11 LAB — COMPREHENSIVE METABOLIC PANEL
ALT: 17 U/L (ref 0–44)
AST: 16 U/L (ref 15–41)
Albumin: 3.6 g/dL (ref 3.5–5.0)
Alkaline Phosphatase: 50 U/L (ref 38–126)
Anion gap: 7 (ref 5–15)
BUN: 21 mg/dL (ref 8–23)
CO2: 28 mmol/L (ref 22–32)
Calcium: 9.1 mg/dL (ref 8.9–10.3)
Chloride: 102 mmol/L (ref 98–111)
Creatinine, Ser: 1.02 mg/dL — ABNORMAL HIGH (ref 0.44–1.00)
GFR calc Af Amer: >60 mL/min (ref >60)
GFR calc non Af Amer: 59 mL/min — ABNORMAL LOW (ref >60)
Glucose, Bld: 113 mg/dL — ABNORMAL HIGH (ref 70–99)
Potassium: 3.5 mmol/L (ref 3.5–5.1)
Sodium: 137 mmol/L (ref 135–145)
Total Bilirubin: 0.9 mg/dL (ref 0.3–1.2)
Total Protein: 6.6 g/dL (ref 6.5–8.1)

## 2019-06-11 LAB — CBC WITH DIFFERENTIAL/PLATELET
Abs Immature Granulocytes: 0.08 10*3/uL — ABNORMAL HIGH (ref 0.00–0.07)
Basophils Absolute: 0 10*3/uL (ref 0.0–0.1)
Basophils Relative: 1 %
Eosinophils Absolute: 0.1 10*3/uL (ref 0.0–0.5)
Eosinophils Relative: 2 %
HCT: 31.8 % — ABNORMAL LOW (ref 36.0–46.0)
Hemoglobin: 10.1 g/dL — ABNORMAL LOW (ref 12.0–15.0)
Immature Granulocytes: 2 %
Lymphocytes Relative: 20 %
Lymphs Abs: 1 10*3/uL (ref 0.7–4.0)
MCH: 30.7 pg (ref 26.0–34.0)
MCHC: 31.8 g/dL (ref 30.0–36.0)
MCV: 96.7 fL (ref 80.0–100.0)
Monocytes Absolute: 0.6 10*3/uL (ref 0.1–1.0)
Monocytes Relative: 11 %
Neutro Abs: 3.3 10*3/uL (ref 1.7–7.7)
Neutrophils Relative %: 64 %
Platelets: 115 10*3/uL — ABNORMAL LOW (ref 150–400)
RBC: 3.29 MIL/uL — ABNORMAL LOW (ref 3.87–5.11)
RDW: 15.9 % — ABNORMAL HIGH (ref 11.5–15.5)
WBC: 5 10*3/uL (ref 4.0–10.5)
nRBC: 0 % (ref 0.0–0.2)

## 2019-06-11 MED ORDER — SODIUM CHLORIDE 0.9 % IV SOLN
40.0000 mg | Freq: Once | INTRAVENOUS | Status: AC
  Administered 2019-06-11: 40 mg via INTRAVENOUS
  Filled 2019-06-11: qty 4

## 2019-06-11 MED ORDER — SODIUM CHLORIDE 0.9 % IV SOLN: Freq: Once | INTRAVENOUS | Status: AC

## 2019-06-11 MED ORDER — DEXTROSE 5 % IV SOLN
54.5000 mg/m2 | Freq: Once | INTRAVENOUS | Status: AC
  Administered 2019-06-11: 120 mg via INTRAVENOUS
  Filled 2019-06-11: qty 60

## 2019-06-11 MED ORDER — FUROSEMIDE 20 MG PO TABS
20.0000 mg | ORAL_TABLET | Freq: Every day | ORAL | 1 refills | Status: DC | PRN
Start: 2019-06-11 — End: 2019-06-30

## 2019-06-11 MED ORDER — HEPARIN SOD (PORK) LOCK FLUSH 100 UNIT/ML IV SOLN
500.0000 [IU] | Freq: Once | INTRAVENOUS | Status: AC
  Administered 2019-06-11: 500 [IU] via INTRAVENOUS

## 2019-06-11 NOTE — Progress Notes (Signed)
Labs and vitals reviewed with Dr. Delton Coombes.  Pt c/o bilateral lower extremity swelling of feet and ankles x 4 days. Pt has been taking HCTZ 25 mg daily with no change in swelling.  +1 pitting edema noted to bilateral feet and ankles.  Otherwise no c/o any other s/s.  This was shared with MD.  Order rec'd to call in Lasix 20mg  daily prn swelling. Okay to proceed with tx today per MD. Pt was advised not to take HCTZ with Lasix.  Advised to resume HCTZ and stop Lasix when swelling had gone down.  She verbalizes understanding.   Tolerated infusions today w/o adverse reaction.  Alert, in no distress.  VSS.  Discharged via wheelchair.

## 2019-06-18 ENCOUNTER — Other Ambulatory Visit (HOSPITAL_COMMUNITY): Payer: Self-pay | Admitting: *Deleted

## 2019-06-18 MED ORDER — POMALIDOMIDE 4 MG PO CAPS: 4.0000 mg | ORAL_CAPSULE | Freq: Every day | ORAL | 0 refills | Status: DC

## 2019-06-18 NOTE — Telephone Encounter (Signed)
Chart reviewed, pomalyst refilled.  

## 2019-06-23 ENCOUNTER — Other Ambulatory Visit (HOSPITAL_COMMUNITY): Payer: Medicare Other

## 2019-06-24 NOTE — Progress Notes (Signed)
[name], [place] [address]   CLINIC:  Medical Oncology/Hematology  PCP:  Abran Richard, MD [address] [place] / [place] [address] [phone]   REASON FOR VISIT:  Follow-up for multiple myeloma  CURRENT THERAPY: Continue with planned chemotherapy treatment carfilzomib  BRIEF ONCOLOGIC HISTORY:  Oncology History  Multiple myeloma ([place])  08/07/2014 Imaging   Bone Survey- No lytic lesions are noted in the visualized skeleton.   09/03/2014 Bone Marrow Biopsy   NORMOCELLULAR BONE MARROW FOR AGE WITH PLASMA CELL NEOPLASM.  The plasma cell component is increased in the marrow representing an estimated 18% of all cells. Cytogenetics with 13q-, 17p- (high risk disease)   09/03/2014 Pathology Results   Cytogenetics with 13q-, 17p- (high risk disease)   09/23/2014 Initial Diagnosis   Multiple myeloma   09/30/2014 PET scan   No abnormal hypermetabolism in the neck, chest, abdomen or pelvis.   10/05/2014 - 03/22/2015 Chemotherapy   RVD   10/28/2014 Treatment Plan Change   Issues related to getting Revlimid in a timely fashion, therefore, she received her Revlimid on 9/21 resulting in a 12 day cycle this time instead of a 14 day cycle   11/02/2014 Imaging   CTA chest- No evidence for a large or central pulmonary embolism as described.  8 mm density along the right minor fissure could represent focal pleural thickening but indeterminate. If the patient is at high risk for bronchogenic carcinoma, follow-up c   11/23/2014 Miscellaneous   Zometa 4 mg IV monthly   04/07/2015 Bone Marrow Biopsy   Normocellular marrow with 2-4% clonal plasma cells by immunohistochemistry. FISH and cytogenetics were normal [facility])     04/2015 Miscellaneous   PRETRANSPLANT EVALUATION:  Pulmonary function tests: FEV1 100.3% / DLCO 97.9%  Echocardiogram: Normal LV function with EF 60-65%    04/27/2015 Procedure   Stem cell mobilization with filgrastim and Mozobil  [facility])   05/06/2015 Miscellaneous   BMT conditioning regimen with high-dose Melphalan given [facility], [name]); Day -1   05/07/2015 Bone Marrow Transplant   Outpatient autologous stem cell transplant [facility], [name]); Day 0   05/18/2015 Miscellaneous   WBC engraftment;  did not require platelet transfusion during her transplant process. Lowest platelet count 28,000    05/20/2015 Procedure   Tunneled catheter removed [facility])   09/08/2015 -  Chemotherapy   Velcade every 2 weeks   12/15/2015 Miscellaneous   Zometa re-instituted.    12/23/2015 Imaging   Bone density- BMD as determined from [facility] Radius 33% is 0.799 g/cm2 with a T-Score of 1.2. This patient is considered normal according to [facility]) criteria.   04/17/2016 Miscellaneous   Started maintenance with N[place].   05/04/2016 Treatment Plan Change   Zometa switched to Xgeva injection monthly given difficult IV access.    03/05/2019 -  Chemotherapy   The patient had carfilzomib (KYPROLIS) 40 mg in dextrose 5 % 50 mL chemo infusion, 18 mg/m2 = 44 mg, Intravenous,  Once, 5 of 5 cycles Dose modification: 56 mg/m2 (original dose 56 mg/m2, Cycle 1, Reason: Provider Judgment) Administration: 40 mg (03/05/2019), 120 mg (03/12/2019), 120 mg (03/20/2019), 120 mg (04/02/2019), 120 mg (04/09/2019), 120 mg (04/16/2019), 120 mg (04/30/2019), 120 mg (05/08/2019), 120 mg (05/14/2019), 120 mg (05/28/2019), 120 mg (06/04/2019), 120 mg (06/11/2019), 120 mg (06/25/2019)  for chemotherapy treatment.      CANCER STAGING: Cancer Staging Multiple myeloma ([place]) Staging form: Multiple Myeloma, AJCC 6th Edition - Clinical stage  from 10/26/2014: Stage IIA - Signed by Baird Cancer, PA-C on 10/26/2014 - Pathologic: No stage assigned - Unsigned   INTERVAL HISTORY:  [name] 64 y.o. female returns for routine follow-up and consideration for next cycle of chemotherapy. Ellayna was last seen on 05/28/2019.  Due for cycle #5 of  carfilzomib today.   Overall, she tells me she has been feeling pretty well. She has been feeling better since she started taking her fluid pills.She notes that she is wheezing a lot, and then at night she notices her wheezing more and she is coughing up "green stuff." She will occasionally cough up stuff in the morning, but its mostly at night. She notes that she is not allergic to any antibiotics.   Overall, she feels ready for next cycle of chemo today.   She denies any difficulty with Xgeva; she denies bone pain or jaw issues   REVIEW OF SYSTEMS:  Review of Systems  Constitutional: Negative for appetite change, chills, diaphoresis, fatigue and fever.  HENT:   Negative for mouth sores, sore throat and trouble swallowing.   Eyes: Negative for eye problems.  Respiratory: Positive for shortness of breath. Negative for cough and wheezing.   Cardiovascular: Positive for leg swelling. Negative for chest pain and palpitations.  Gastrointestinal: Negative for abdominal pain, constipation, diarrhea, nausea and vomiting.  Genitourinary: Negative for bladder incontinence, dysuria and frequency.   Musculoskeletal: Negative for arthralgias, back pain and myalgias.  Skin: Negative for rash.  Neurological: Positive for numbness. Negative for dizziness, extremity weakness and headaches.  Hematological: Does not bruise/bleed easily.  Psychiatric/Behavioral: Negative for depression and sleep disturbance. The patient is not nervous/anxious.     PAST MEDICAL/SURGICAL HISTORY:  Past Medical History:  Diagnosis Date  . Anemia   . Arthritis   . Cancer ([place])   . Claustrophobia 10/05/2014  . Hypertension   . Leukopenia 08/03/2014  . Normocytic hypochromic anemia 08/03/2014  . Renal disorder    stage 3    Past Surgical History:  Procedure Laterality Date  . ABDOMINAL HYSTERECTOMY    . CARDIAC SURGERY    . OTHER SURGICAL HISTORY     heart surgery as infant to "repair hole in heart"  . PORTACATH  PLACEMENT Left 02/21/2019   Procedure: INSERTION PORT-A-CATH;  Surgeon: Virl Cagey, MD;  Location: [facility];  Service: General;  Laterality: Left;    SOCIAL HISTORY:  Social History   Socioeconomic History  . Marital status: Legally Separated    Spouse name: Not on file  . Number of children: Not on file  . Years of education: Not on file  . Highest education level: Not on file  Occupational History  . Not on file  Tobacco Use  . Smoking status: Never Smoker  . Smokeless tobacco: Never Used  Substance and Sexual Activity  . Alcohol use: No  . Drug use: No  . Sexual activity: Not on file    Comment: divorced- 2 daughters  Other Topics Concern  . Not on file  Social History Narrative  . Not on file   Social Determinants of Health   Financial Resource Strain:   . Difficulty of Paying Living Expenses:   Food Insecurity:   . Worried About Charity fundraiser in the Last Year:   . Arboriculturist in the Last Year:   Transportation Needs:   . Film/video editor (Medical):   Marland Kitchen Lack of Transportation (Non-Medical):   Physical Activity:   . Days  of Exercise per Week:   . Minutes of Exercise per Session:   Stress:   . Feeling of Stress :   Social Connections:   . Frequency of Communication with Friends and Family:   . Frequency of Social Gatherings with Friends and Family:   . Attends Religious Services:   . Active Member of Clubs or Organizations:   . Attends Archivist Meetings:   Marland Kitchen Marital Status:   Intimate Partner Violence:   . Fear of Current or Ex-Partner:   . Emotionally Abused:   Marland Kitchen Physically Abused:   . Sexually Abused:     FAMILY HISTORY:  Family History  Problem Relation Age of Onset  . Cancer Mother   . Hypertension Mother   . Cancer Father   . Hypertension Father   . Cancer Maternal Grandmother   . Hypertension Sister   . Hypertension Brother     CURRENT MEDICATIONS:  Current Outpatient Medications  Medication Sig Dispense  Refill  . acyclovir (ZOVIRAX) 400 MG tablet Take 1 tablet (400 mg total) by mouth 2 (two) times daily. 60 tablet 5  . albuterol (VENTOLIN HFA) 108 (90 Base) MCG/ACT inhaler Inhale 2 puffs into the lungs every 6 (six) hours as needed for wheezing or shortness of breath. 8 g 3  . Ascorbic Acid (VITAMIN C) 1000 MG tablet Take 1,000 mg by mouth daily.     Marland Kitchen aspirin EC 81 MG tablet Take 81 mg by mouth daily.    . calcium carbonate (OS-CAL) 1250 (500 Ca) MG chewable tablet     . CARFILZOMIB IV Inject into the vein.    . cholecalciferol (VITAMIN D) 1000 units tablet Take 1,000 Units by mouth daily.    Marland Kitchen dexamethasone (DECADRON) 4 MG tablet Take 10 tablets (40 mg total) by mouth once a week. On the week you are off of treatment 10 tablet 3  . gabapentin (NEURONTIN) 300 MG capsule Take 1 capsule (300 mg total) by mouth 3 (three) times daily. (Patient taking differently: Take 300 mg by mouth 2 (two) times daily. ) 90 capsule 3  . hydrochlorothiazide (HYDRODIURIL) 25 MG tablet TAKE 1 TABLET BY MOUTH ONCE DAILY. (Patient taking differently: Take 25 mg by mouth daily. ) 30 tablet 0  . magnesium oxide (MAG-OX) 400 (241.3 Mg) MG tablet Take 1 tablet (400 mg total) by mouth 2 (two) times daily. 60 tablet 2  . Multiple Vitamin (TAB-A-VITE) TABS TAKE 1 TABLET BY MOUTH ONCE DAILY. (Patient taking differently: Take 1 tablet by mouth daily. ) 30 tablet 11  . pomalidomide (POMALYST) 4 MG capsule Take 1 capsule (4 mg total) by mouth daily. 21 capsule 0  . trolamine salicylate (ASPERCREME) 10 % cream Apply 1 application topically 2 (two) times daily.    Marland Kitchen acetaminophen (TYLENOL 8 HOUR ARTHRITIS PAIN) 650 MG CR tablet Take 650 mg by mouth every 8 (eight) hours as needed for pain.    Marland Kitchen azithromycin (ZITHROMAX) 250 MG tablet Take 2 tablets today, followed by one tablet daily 6 each 0  . furosemide (LASIX) 20 MG tablet Take 1 tablet (20 mg total) by mouth daily as needed for edema (take as needed for swelling). DO NOT take  with hydrochlorothiazide (Patient not taking: Reported on 06/25/2019) 30 tablet 1  . ibuprofen (ADVIL) 400 MG tablet Take 1 tablet (400 mg total) by mouth every 8 (eight) hours as needed. (Patient not taking: Reported on 03/05/2019) 90 tablet 0  . lidocaine-prilocaine (EMLA) cream Apply a quarter sized glob  1 hour prior to treatment. (Patient not taking: Reported on 06/25/2019) 30 g 5  . prochlorperazine (COMPAZINE) 10 MG tablet Take 1 tablet (10 mg total) by mouth every 6 (six) hours as needed for nausea or vomiting. (Patient not taking: Reported on 02/13/2019) 60 tablet 2   No current facility-administered medications for this visit.   Facility-Administered Medications Ordered in Other Visits  Medication Dose Route Frequency Provider Last Rate Last Admin  . heparin lock flush 100 unit/mL  500 Units Intravenous Once Penland, Larene Beach K, MD      . sodium chloride 0.9 % injection 10 mL  10 mL Intravenous Once Penland, Kelby Fam, MD        ALLERGIES:  Allergies  Allergen Reactions  . Diclofenac Swelling    Per pt, facial swelling    PHYSICAL EXAM:  Performance status (ECOG): 1 - Symptomatic but completely ambulatory  Vitals:   06/25/19 0947  BP: (!) 122/56  Pulse: 84  Resp: 20  Temp: (!) 97.3 F (36.3 C)  SpO2: 97%   Wt Readings from Last 3 Encounters:  06/25/19 (!) 356 lb 9.6 oz (161.8 kg)  06/11/19 (!) 350 lb 12.8 oz (159.1 kg)  06/04/19 (!) 349 lb (158.3 kg)   Physical Exam Constitutional:      General: She is not in acute distress.    Appearance: Normal appearance. She is normal weight. She is not ill-appearing.  HENT:     Mouth/Throat:     Mouth: Mucous membranes are moist.     Pharynx: No oropharyngeal exudate or posterior oropharyngeal erythema.  Eyes:     Extraocular Movements: Extraocular movements intact.     Pupils: Pupils are equal, round, and reactive to light.  Cardiovascular:     Rate and Rhythm: Normal rate and regular rhythm.     Pulses: Normal pulses.      Heart sounds: Normal heart sounds. No murmur. No friction rub. No gallop.   Pulmonary:     Effort: Pulmonary effort is normal.     Breath sounds: Normal breath sounds. No wheezing, rhonchi or rales.  Abdominal:     Palpations: There is no mass.     Tenderness: There is no abdominal tenderness. There is no guarding.  Musculoskeletal:        General: No swelling or tenderness.     Right lower leg: No edema.     Left lower leg: No edema.  Skin:    Findings: No bruising or erythema.  Neurological:     Mental Status: She is alert and oriented to person, place, and time.     Sensory: No sensory deficit.  Psychiatric:        Mood and Affect: Mood normal.        Behavior: Behavior normal.        Thought Content: Thought content normal.        Judgment: Judgment normal.     LABORATORY DATA:  I have reviewed the labs as listed.  CBC Latest Ref Rng & Units 06/25/2019 06/11/2019 06/04/2019  WBC 4.0 - 10.5 K/uL 4.0 5.0 6.5  Hemoglobin 12.0 - 15.0 g/dL 10.4(L) 10.1(L) 11.0(L)  Hematocrit 36.0 - 46.0 % 32.4(L) 31.8(L) 35.2(L)  Platelets 150 - 400 K/uL 195 115(L) 152   CMP Latest Ref Rng & Units 06/25/2019 06/11/2019 06/04/2019  Glucose 70 - 99 mg/dL 130(H) 113(H) 115(H)  BUN 8 - 23 mg/dL 19 21 22   Creatinine 0.44 - 1.00 mg/dL 0.92 1.02(H) 1.04(H)  Sodium 135 - 145  mmol/L 141 137 139  Potassium 3.5 - 5.1 mmol/L 3.4(L) 3.5 3.4(L)  Chloride 98 - 111 mmol/L 100 102 99  CO2 22 - 32 mmol/L 30 28 26   Calcium 8.9 - 10.3 mg/dL 9.7 9.1 9.3  Total Protein 6.5 - 8.1 g/dL 6.2(L) 6.6 6.9  Total Bilirubin 0.3 - 1.2 mg/dL 0.7 0.9 1.0  Alkaline Phos 38 - 126 U/L 47 50 59  AST 15 - 41 U/L 14(L) 16 19  ALT 0 - 44 U/L 14 17 17     DIAGNOSTIC IMAGING:  I have independently reviewed the scans and discussed with the patient.  ASSESSMENT & PLAN:  Multiple myeloma ([place]) 1.  Relapsed IgG kappa multiple myeloma with high risk features: -4 cycles of carfilzomib, pomalidomide and dexamethasone from 03/05/2019  through 05/28/2019. -Myeloma panel from 05/14/2019 shows M spike improved to 0.4 g, 0.5 g previously and 1.1 g pretreatment.  Light chain ratio improved to 1.20.  Previously 2.69. -Myeloma panel from 06/04/2019 shows M spike stable at 0.4 g compared to M spike from 05/14/2019. Kappa light chains improved to 26.3 from 41.7. Ratio is 1.28. -We reviewed labs from today. They are adequate to proceed with cycle 5-day 1 of carfilzomib. I will reevaluate her in 4 weeks.  2.  Shortness of breath on exertion: -2D echo on 02/09/2019 shows EF 60-65%.  No LVH.  Chest CT PE protocol on 02/24/2019 was negative. -Cardiac MRI on 03/07/2019 shows LVEF 56% with no amyloid.  Troponin T and proBNP were negative. -Pulmonary function tests show low expiratory reserve volume consistent with body habitus.  Moderate diffusion defect which corrects to normal.  FVC, FEV1, FEV1/FVC ratio are within normal limits.  FVC is reduced relative to the SVC indicates air trapping.  No significant response after bronchodilators.  Reduced diffusing capacity indicates moderate loss of functional alveolar capillary space. -She is using albuterol inhaler twice daily. No improvement in dyspnea on exertion. Reports some cough with greenish expectoration. We will give her a Z-Pak. We will make a referral to pulmonary.  3.  ID prophylaxis: -Continue acyclovir twice daily. Continue aspirin.  4.  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 Continue gabapentin 300 mg 3 times daily.  5.  Lower extremity edema: -She takes Lasix as needed. She is also taking HCTZ.  6.  Myeloma bone disease: -Zometa was started back on 05/26/2019. She tolerated very well.    Orders placed this encounter:  Orders Placed This Encounter  Procedures  . CBC with Differential/Platelet  . Comprehensive metabolic panel  . Magnesium  . Protein electrophoresis, serum  . Kappa/lambda light chains  . Lactate dehydrogenase       Derek Jack, MD, 06/26/19  7:41 PM  [place] [phone]   I, Jacqualyn Posey, am acting as a scribe for Dr. Sanda Linger.  I, Derek Jack MD, have reviewed the above documentation for accuracy and completeness, and I agree with the above.

## 2019-06-25 ENCOUNTER — Inpatient Hospital Stay (HOSPITAL_COMMUNITY): Payer: Medicare Other

## 2019-06-25 ENCOUNTER — Other Ambulatory Visit: Payer: Self-pay

## 2019-06-25 ENCOUNTER — Inpatient Hospital Stay (HOSPITAL_BASED_OUTPATIENT_CLINIC_OR_DEPARTMENT_OTHER): Payer: Medicare Other | Admitting: Hematology

## 2019-06-25 VITALS — BP 147/78 | HR 74 | Temp 97.6°F | Resp 18

## 2019-06-25 VITALS — BP 122/56 | HR 84 | Temp 97.3°F | Resp 20 | Wt 356.6 lb

## 2019-06-25 DIAGNOSIS — C9000 Multiple myeloma not having achieved remission: Secondary | ICD-10-CM

## 2019-06-25 DIAGNOSIS — Z5112 Encounter for antineoplastic immunotherapy: Secondary | ICD-10-CM | POA: Diagnosis not present

## 2019-06-25 DIAGNOSIS — C9001 Multiple myeloma in remission: Secondary | ICD-10-CM

## 2019-06-25 LAB — COMPREHENSIVE METABOLIC PANEL
ALT: 14 U/L (ref 0–44)
AST: 14 U/L — ABNORMAL LOW (ref 15–41)
Albumin: 3.8 g/dL (ref 3.5–5.0)
Alkaline Phosphatase: 47 U/L (ref 38–126)
Anion gap: 11 (ref 5–15)
BUN: 19 mg/dL (ref 8–23)
CO2: 30 mmol/L (ref 22–32)
Calcium: 9.7 mg/dL (ref 8.9–10.3)
Chloride: 100 mmol/L (ref 98–111)
Creatinine, Ser: 0.92 mg/dL (ref 0.44–1.00)
GFR calc Af Amer: >60 mL/min (ref >60)
GFR calc non Af Amer: >60 mL/min (ref >60)
Glucose, Bld: 130 mg/dL — ABNORMAL HIGH (ref 70–99)
Potassium: 3.4 mmol/L — ABNORMAL LOW (ref 3.5–5.1)
Sodium: 141 mmol/L (ref 135–145)
Total Bilirubin: 0.7 mg/dL (ref 0.3–1.2)
Total Protein: 6.2 g/dL — ABNORMAL LOW (ref 6.5–8.1)

## 2019-06-25 LAB — CBC WITH DIFFERENTIAL/PLATELET
Abs Immature Granulocytes: 0.01 10*3/uL (ref 0.00–0.07)
Basophils Absolute: 0.1 10*3/uL (ref 0.0–0.1)
Basophils Relative: 2 %
Eosinophils Absolute: 0.1 10*3/uL (ref 0.0–0.5)
Eosinophils Relative: 2 %
HCT: 32.4 % — ABNORMAL LOW (ref 36.0–46.0)
Hemoglobin: 10.4 g/dL — ABNORMAL LOW (ref 12.0–15.0)
Immature Granulocytes: 0 %
Lymphocytes Relative: 24 %
Lymphs Abs: 1 10*3/uL (ref 0.7–4.0)
MCH: 31.7 pg (ref 26.0–34.0)
MCHC: 32.1 g/dL (ref 30.0–36.0)
MCV: 98.8 fL (ref 80.0–100.0)
Monocytes Absolute: 0.6 10*3/uL (ref 0.1–1.0)
Monocytes Relative: 16 %
Neutro Abs: 2.2 10*3/uL (ref 1.7–7.7)
Neutrophils Relative %: 56 %
Platelets: 195 10*3/uL (ref 150–400)
RBC: 3.28 MIL/uL — ABNORMAL LOW (ref 3.87–5.11)
RDW: 17.2 % — ABNORMAL HIGH (ref 11.5–15.5)
WBC: 4 10*3/uL (ref 4.0–10.5)
nRBC: 0 % (ref 0.0–0.2)

## 2019-06-25 MED ORDER — ZOLEDRONIC ACID 4 MG/100ML IV SOLN
INTRAVENOUS | Status: AC
  Filled 2019-06-25: qty 100

## 2019-06-25 MED ORDER — SODIUM CHLORIDE 0.9% FLUSH
10.0000 mL | INTRAVENOUS | Status: DC | PRN
  Administered 2019-06-25: 10 mL

## 2019-06-25 MED ORDER — SODIUM CHLORIDE 0.9 % IV SOLN
40.0000 mg | Freq: Once | INTRAVENOUS | Status: AC
  Administered 2019-06-25: 40 mg via INTRAVENOUS
  Filled 2019-06-25: qty 4

## 2019-06-25 MED ORDER — HEPARIN SOD (PORK) LOCK FLUSH 100 UNIT/ML IV SOLN
500.0000 [IU] | Freq: Once | INTRAVENOUS | Status: AC | PRN
  Administered 2019-06-25: 500 [IU]

## 2019-06-25 MED ORDER — SODIUM CHLORIDE 0.9 % IV SOLN: Freq: Once | INTRAVENOUS | Status: DC

## 2019-06-25 MED ORDER — SODIUM CHLORIDE 0.9 % IV SOLN: Freq: Once | INTRAVENOUS | Status: AC

## 2019-06-25 MED ORDER — DEXTROSE 5 % IV SOLN
54.5000 mg/m2 | Freq: Once | INTRAVENOUS | Status: AC
  Administered 2019-06-25: 120 mg via INTRAVENOUS
  Filled 2019-06-25: qty 60

## 2019-06-25 MED ORDER — ZOLEDRONIC ACID 4 MG/100ML IV SOLN
4.0000 mg | Freq: Once | INTRAVENOUS | Status: AC
  Administered 2019-06-25: 4 mg via INTRAVENOUS

## 2019-06-25 MED ORDER — AZITHROMYCIN 250 MG PO TABS
ORAL_TABLET | ORAL | 0 refills | Status: DC
Start: 2019-06-25 — End: 2019-07-23

## 2019-06-25 NOTE — Progress Notes (Signed)
Patient presents today for treatment and follow up visit with Dr. Delton Coombes. Labs pending. Vital signs within parameters for treatment. Patient had pain today that she rates a 7/10 in her knees bilateral, chronic. Patient states her legs are no longer swollen due to she changed her blood pressure medication.   Message received from [facility] LPN/ Dr. Delton Coombes. Proceed with treatment. Labs reviewed.   Treatment given today per MD orders. Tolerated infusion without adverse affects. Vital signs stable. No complaints at this time. Discharged from clinic via wheel chair.  F/U with [facility] as scheduled.

## 2019-06-25 NOTE — Progress Notes (Signed)
Patient has been assessed, vital signs and labs have been reviewed by Dr. Katragadda. ANC, Creatinine, LFTs, and Platelets are within treatment parameters per Dr. Katragadda. The patient is good to proceed with treatment at this time.  

## 2019-06-25 NOTE — Patient Instructions (Addendum)
[place] at [facility] Discharge Instructions  You were seen today by Dr. Delton Coombes. He went over your recent results. He is prescribing you a Z pack to treat your green sputum; please take as directed. He will see you back in for labs and follow up.   Thank you for choosing [place] at [facility] to provide your oncology and hematology care.  To afford each patient quality time with our provider, please arrive at least 15 minutes before your scheduled appointment time.   If you have a lab appointment with the [place] please come in thru the  Main Entrance and check in at the main information desk  You need to re-schedule your appointment should you arrive 10 or more minutes late.  We strive to give you quality time with our providers, and arriving late affects you and other patients whose appointments are after yours.  Also, if you no show three or more times for appointments you may be dismissed from the clinic at the providers discretion.     Again, thank you for choosing [facility].  Our hope is that these requests will decrease the amount of time that you wait before being seen by our physicians.       _____________________________________________________________  Should you have questions after your visit to [facility], please contact our office at [phone] between the hours of 8:00 a.m. and 4:30 p.m.  Voicemails left after 4:00 p.m. will not be returned until the following business day.  For prescription refill requests, have your pharmacy contact our office and allow 72 hours.    Cancer Center Support Programs:   > Cancer Support Group  2nd Tuesday of the month 1pm-2pm, Journey Room

## 2019-06-25 NOTE — Patient Instructions (Signed)
[facility] at [facility] Discharge Instructions  Labs drawn from portacath today   Thank you for choosing [facility] at [facility] to provide your oncology and hematology care.  To afford each patient quality time with our provider, please arrive at least 15 minutes before your scheduled appointment time.   If you have a lab appointment with the Cancer Center please come in thru the Main Entrance and check in at the main information desk.  You need to re-schedule your appointment should you arrive 10 or more minutes late.  We strive to give you quality time with our providers, and arriving late affects you and other patients whose appointments are after yours.  Also, if you no show three or more times for appointments you may be dismissed from the clinic at the providers discretion.     Again, thank you for choosing [facility].  Our hope is that these requests will decrease the amount of time that you wait before being seen by our physicians.       _____________________________________________________________  Should you have questions after your visit to [facility], please contact our office at [phone] between the hours of 8:00 a.m. and 4:30 p.m.  Voicemails left after 4:00 p.m. will not be returned until the following business day.  For prescription refill requests, have your pharmacy contact our office and allow 72 hours.    Due to Covid, you will need to wear a mask upon entering the hospital. If you do not have a mask, a mask will be given to you at the Main Entrance upon arrival. For doctor visits, patients may have 1 support person with them. For treatment visits, patients can not have anyone with them due to social distancing guidelines and our immunocompromised population.     

## 2019-06-25 NOTE — Patient Instructions (Signed)
[facility] Discharge Instructions for Patients Receiving Chemotherapy  Today you received the following chemotherapy agents   To help prevent nausea and vomiting after your treatment, we encourage you to take your nausea medication   If you develop nausea and vomiting that is not controlled by your nausea medication, call the clinic.   BELOW ARE SYMPTOMS THAT SHOULD BE REPORTED IMMEDIATELY:  *FEVER GREATER THAN 100.5 F  *CHILLS WITH OR WITHOUT FEVER  NAUSEA AND VOMITING THAT IS NOT CONTROLLED WITH YOUR NAUSEA MEDICATION  *UNUSUAL SHORTNESS OF BREATH  *UNUSUAL BRUISING OR BLEEDING  TENDERNESS IN MOUTH AND THROAT WITH OR WITHOUT PRESENCE OF ULCERS  *URINARY PROBLEMS  *BOWEL PROBLEMS  UNUSUAL RASH Items with * indicate a potential emergency and should be followed up as soon as possible.  Feel free to call the clinic should you have any questions or concerns. The clinic phone number is [phone].  Please show the CHEMO ALERT CARD at check-in to the Emergency Department and triage nurse.   

## 2019-06-26 NOTE — Assessment & Plan Note (Signed)
1.  Relapsed IgG kappa multiple myeloma with high risk features: -4 cycles of carfilzomib, pomalidomide and dexamethasone from 03/05/2019 through 05/28/2019. -Myeloma panel from 05/14/2019 shows M spike improved to 0.4 g, 0.5 g previously and 1.1 g pretreatment.  Light chain ratio improved to 1.20.  Previously 2.69. -Myeloma panel from 06/04/2019 shows M spike stable at 0.4 g compared to M spike from 05/14/2019. Kappa light chains improved to 26.3 from 41.7. Ratio is 1.28. -We reviewed labs from today. They are adequate to proceed with cycle 5-day 1 of carfilzomib. I will reevaluate her in 4 weeks.  2.  Shortness of breath on exertion: -2D echo on 02/09/2019 shows EF 60-65%.  No LVH.  Chest CT PE protocol on 02/24/2019 was negative. -Cardiac MRI on 03/07/2019 shows LVEF 56% with no amyloid.  Troponin T and proBNP were negative. -Pulmonary function tests show low expiratory reserve volume consistent with body habitus.  Moderate diffusion defect which corrects to normal.  FVC, FEV1, FEV1/FVC ratio are within normal limits.  FVC is reduced relative to the SVC indicates air trapping.  No significant response after bronchodilators.  Reduced diffusing capacity indicates moderate loss of functional alveolar capillary space. -She is using albuterol inhaler twice daily. No improvement in dyspnea on exertion. Reports some cough with greenish expectoration. We will give her a Z-Pak. We will make a referral to pulmonary.  3.  ID prophylaxis: -Continue acyclovir twice daily. Continue aspirin.  4.  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 Continue gabapentin 300 mg 3 times daily.  5.  Lower extremity edema: -She takes Lasix as needed. She is also taking HCTZ.  6.  Myeloma bone disease: -Zometa was started back on 05/26/2019. She tolerated very well.

## 2019-06-27 ENCOUNTER — Ambulatory Visit (HOSPITAL_COMMUNITY)
Admission: RE | Admit: 2019-06-27 | Discharge: 2019-06-27 | Disposition: A | Discharge status: Home / Self Care | Payer: Medicare Other | Source: Ambulatory Visit | Attending: Internal Medicine | Admitting: Internal Medicine

## 2019-06-27 ENCOUNTER — Other Ambulatory Visit: Payer: Self-pay

## 2019-06-27 ENCOUNTER — Encounter: Payer: Self-pay | Admitting: Internal Medicine

## 2019-06-27 ENCOUNTER — Ambulatory Visit (INDEPENDENT_AMBULATORY_CARE_PROVIDER_SITE_OTHER): Payer: Medicare Other | Admitting: Internal Medicine

## 2019-06-27 DIAGNOSIS — R0600 Dyspnea, unspecified: Secondary | ICD-10-CM

## 2019-06-27 DIAGNOSIS — R0609 Other forms of dyspnea: Secondary | ICD-10-CM

## 2019-06-27 DIAGNOSIS — R0602 Shortness of breath: Secondary | ICD-10-CM | POA: Insufficient documentation

## 2019-06-27 NOTE — Progress Notes (Addendum)
Kelli Hurley, female    DOB: Feb 10, 1955,     MRN: [id number]   Brief patient profile:  18 yobf never smoker, never allergies / asthma with onset of sob attributed to  Anemia 2016 and dx MM June 2018 with w/u Dr Kelli Hurley she says neg but  rx 02 and 2 inhalers one of which was powder that made her cough but eventually did  Ok on maint rx? zometa? By [facility]  s 02 or inhalers with MMRC1 x several years  then more sob again since Nov 2020   at dx of recurrent MM and  restarted proair  "helps until I get up and walk again"  referred to pulmonary clinic 06/27/2019 by Dr   Kelli Hurley.     History of Present Illness  06/27/2019  Pulmonary/ 1st office eval/Kelli Hurley  Chief Complaint  Patient presents with  . Pulmonary Consult    Referred by Dr. Delton Hurley. Pt c/o SOB since Jan 2021. Pt states has recurrent lung CA.- going through chemo now. She is winded just walking short distances Hurley as lobby to exam room today.   Dyspnea: 50 ft no progression since onset and occurred prior to receiving any chemo  Cough: assoc with noct cough/wheeze  variably discolored > on zpak  Sleep: on side /bed flat  SABA use: doesn't really help at all   No obvious day to day or daytime variability or assoc excess/ purulent sputum or mucus plugs or hemoptysis or cp or chest tightness,   or overt sinus or hb symptoms.   Sleeping  without nocturnal  or early am exacerbation  of respiratory  c/o's or need for noct saba. Also denies any obvious fluctuation of symptoms with weather or environmental changes or other aggravating or alleviating factors except as outlined above   No unusual exposure hx or h/o childhood pna/ asthma or knowledge of premature birth.  Current Allergies, Complete Past Medical History, Past Surgical History, Family History, and Social History were reviewed in [employer] record.  ROS  The following are not active complaints unless bolded Hoarseness, sore throat, dysphagia, dental  problems, itching, sneezing,  nasal congestion or discharge of excess mucus or purulent secretions, ear ache,   fever, chills, sweats, unintended wt loss or wt gain, classically pleuritic or exertional cp,  orthopnea pnd or arm/hand swelling  or leg swelling, presyncope, palpitations, abdominal pain, anorexia, nausea, vomiting, diarrhea  or change in bowel habits or change in bladder habits, change in stools or change in urine, dysuria, hematuria,  rash, arthralgias, visual complaints, headache, numbness, weakness or ataxia or problems with walking or coordination,  change in mood or  memory.           Past Medical History:  Diagnosis Date  . Anemia   . Arthritis   . Cancer ([place])   . Claustrophobia 10/05/2014  . Hypertension   . Leukopenia 08/03/2014  . Normocytic hypochromic anemia 08/03/2014  . Renal disorder    stage 3     Outpatient Medications Prior to Visit  Medication Sig Dispense Refill  . acetaminophen (TYLENOL 8 HOUR ARTHRITIS PAIN) 650 MG CR tablet Take 650 mg by mouth every 8 (eight) hours as needed for pain.    Marland Kitchen acyclovir (ZOVIRAX) 400 MG tablet Take 1 tablet (400 mg total) by mouth 2 (two) times daily. 60 tablet 5  . albuterol (VENTOLIN HFA) 108 (90 Base) MCG/ACT inhaler Inhale 2 puffs into the lungs every 6 (six) hours as needed for wheezing  or shortness of breath. 8 g 3  . Ascorbic Acid (VITAMIN C) 1000 MG tablet Take 1,000 mg by mouth daily.     Marland Kitchen aspirin EC 81 MG tablet Take 81 mg by mouth daily.    Marland Kitchen azithromycin (ZITHROMAX) 250 MG tablet Take 2 tablets today, followed by one tablet daily 6 each 0  . calcium carbonate (OS-CAL) 1250 (500 Ca) MG chewable tablet     . CARFILZOMIB IV Inject into the vein.    . cholecalciferol (VITAMIN D) 1000 units tablet Take 1,000 Units by mouth daily.    Marland Kitchen dexamethasone (DECADRON) 4 MG tablet Take 10 tablets (40 mg total) by mouth once a week. On the week you are off of treatment 10 tablet 3  . furosemide (LASIX) 20 MG tablet Take 1  tablet (20 mg total) by mouth daily as needed for edema (take as needed for swelling). DO NOT take with hydrochlorothiazide 30 tablet 1  . gabapentin (NEURONTIN) 300 MG capsule Take 1 capsule (300 mg total) by mouth 3 (three) times daily. (Patient taking differently: Take 300 mg by mouth 2 (two) times daily. ) 90 capsule 3  . hydrochlorothiazide (HYDRODIURIL) 25 MG tablet TAKE 1 TABLET BY MOUTH ONCE DAILY. (Patient taking differently: Take 25 mg by mouth daily. ) 30 tablet 0  . ibuprofen (ADVIL) 400 MG tablet Take 1 tablet (400 mg total) by mouth every 8 (eight) hours as needed. 90 tablet 0  . lidocaine-prilocaine (EMLA) cream Apply a quarter sized glob 1 hour prior to treatment. 30 g 5  . magnesium oxide (MAG-OX) 400 (241.3 Mg) MG tablet Take 1 tablet (400 mg total) by mouth 2 (two) times daily. 60 tablet 2  . Multiple Vitamin (TAB-A-VITE) TABS TAKE 1 TABLET BY MOUTH ONCE DAILY. (Patient taking differently: Take 1 tablet by mouth daily. ) 30 tablet 11  . pomalidomide (POMALYST) 4 MG capsule Take 1 capsule (4 mg total) by mouth daily. 21 capsule 0  . prochlorperazine (COMPAZINE) 10 MG tablet Take 1 tablet (10 mg total) by mouth every 6 (six) hours as needed for nausea or vomiting. 60 tablet 2  . trolamine salicylate (ASPERCREME) 10 % cream Apply 1 application topically 2 (two) times daily.        Objective:     BP 118/80 (BP Location: Left Arm, Cuff Size: Large)   Pulse 94   Temp (!) 97.5 F (36.4 C) (Temporal)   Ht 5' 2"  (1.575 m)   Wt (!) 354 lb (160.6 kg)   SpO2 94% Comment: on RA  BMI 64.75 kg/m   SpO2: 94 %(on RA)   Wt Readings from Last 3 Encounters:  06/27/19 (!) 354 lb (160.6 kg)  06/25/19 (!) 356 lb 9.6 oz (161.8 kg)  06/11/19 (!) 350 lb 12.8 oz (159.1 kg)     amb massively obese bf nad at rest   HEENT : pt wearing mask not removed for exam due to covid -19 concerns.    NECK :  without JVD/Nodes/TM/ nl carotid upstrokes bilaterally   LUNGS: no acc muscle use,  Nl  contour chest which is clear to A and P bilaterally without cough on insp or exp maneuvers   CV:  RRR  no s3 or murmur or increase in P2, and  trace edema   ABD:  Obese soft and nontender with limited inspiratory excursion  . No bruits or organomegaly appreciated, bowel sounds nl  MS:  Nl gait/ ext warm without deformities, calf tenderness, cyanosis or clubbing No  obvious joint restrictions   SKIN: warm and dry without lesions    NEURO:  alert, approp, nl sensorium with  no motor or cerebellar deficits apparent.   CXR PA and Lateral:   06/27/2019 :    I personally reviewed images and agree with radiology impression as follows: Cm assoc with low lung volumes and increased markings   Labs ordered/ reviewed:      Chemistry      Component Value Date/Time   NA 141 06/25/2019 0935   K 3.4 (L) 06/25/2019 0935   CL 100 06/25/2019 0935   CO2 30 06/25/2019 0935   BUN 19 06/25/2019 0935   CREATININE 0.92 06/25/2019 0935      Component Value Date/Time   CALCIUM 9.7 06/25/2019 0935   ALKPHOS 47 06/25/2019 0935   AST 14 (L) 06/25/2019 0935   ALT 14 06/25/2019 0935   BILITOT 0.7 06/25/2019 0935        Lab Results  Component Value Date   WBC 4.0 06/25/2019   HGB 10.4 (L) 06/25/2019   HCT 32.4 (L) 06/25/2019   MCV 98.8 06/25/2019   PLT 195 06/25/2019          Lab Results  Component Value Date   ESRSEDRATE 55 (H) 10/20/2015   ESRSEDRATE 50 (H) 09/22/2015   ESRSEDRATE 60 (H) 03/15/2015    Labs ordered 06/27/2019  :  Bnp, esr, tsh but did not go to lab as requested               Assessment   DOE (dyspnea on exertion) Onset 2016 with dx of MM - recurrent doe with dx of recurrent MM Jan 2021 prior to rx  - Echo 02/19/19 nl x for mild LAE  - CTa 02/24/19 no large PE, no ILD  - Neg MR cardiac 03/07/19 neg for amyloid, nl ef  - PFT's  05/20/19   FEV1 1.92 (103 % ) ratio 0.91  p 6 % improvement from saba p nothing prior to study with DLCO  11.73 (62%) corrects to 3.66 (85 %)   for alv volume and FV curve nl and ERV 36%   -  06/27/2019   Walked RA  approx   100 ft  @ slow pace  stopped due to  Sob with sats still 91%   Symptoms are   disproportionate to objective findings and not clear to what extent this is actually a pulmonary  problem but pt does appear to have difficult to sort out respiratory symptoms of unknown origin for which  DDX  = almost all start with A and  include Adherence, Ace Inhibitors, Acid Reflux, Active Sinus Disease, Alpha 1 Antitripsin deficiency, Anxiety masquerading as Airways dz,  ABPA,  Allergy(esp in young), Aspiration (esp in elderly), Adverse effects of meds,  Active smoking or Vaping, A bunch of PE's/clot burden (a few small clots can't cause this syndrome unless there is already severe underlying pulm or vascular dz with poor reserve),  Anemia or thyroid disorder, plus two Bs  = Bronchiectasis and Beta blocker use..and one C= CHF     Adherence is always the initial "prime suspect" and is a multilayered concern that requires a "trust but verify" approach in every patient - starting with knowing how to use medications, especially inhalers, correctly, keeping up with refills and understanding the fundamental difference between maintenance and prns vs those medications only taken for a very short course and then stopped and not refilled.  - really not sure we have  accurate/complete medication reconciliation here - rec return Adherence is always the initial "prime suspect" and is a multilayered concern that requires a "trust but verify" approach in every patient - starting with knowing how to use medications, especially inhalers, correctly, keeping up with refills and understanding the fundamental difference between maintenance and prns vs those medications only taken for a very short course and then stopped and not refilled.   ? Adverse effects of meds if one of my greatest concerns here  > see above, hard to exclude in pt seeing so many providers with  so many instructions > no obvious culprits identified by my Hx  though Zometa has ILD listed she had taken this prior to CTa that did not show def  ILD though need HRCT to exclude it  ? Allergy/ asthma > nothing to suggest this But I spent extra time with pt today reviewing appropriate use of albuterol for prn use on exertion with the following points: 1) saba is for relief of sob that does not improve by walking a slower pace or resting but rather if the pt does not improve after trying this first. 2) If the pt is convinced, as many are, that saba helps recover from activity faster then it's easy to tell if this is the case by re-challenging : ie stop, take the inhaler, then p 5 minutes try the exact same activity (intensity of workload) that just caused the symptoms and see if they are substantially diminished or not after saba 3) if there is an activity that reproducibly causes the symptoms, try the saba 15 min before the activity on alternate days   If in fact the saba really does help, then fine to continue to use it prn but advised may need to look closer at the maintenance regimen being used to achieve better control of airways disease with exertion.   ? Anxiety/depression/ deconditioning with severe morbid obesity  > usually at the bottom of this list of usual suspects but  may interfere with adherence and also interpretation of response or lack thereof to symptom management which can be quite subjective.   ? A bunch of  PE's > no progression of symptoms since onset with neg CTa for large PE's then  ? Acid (or non-acid) GERD > always difficult to exclude as up to 75% of pts in some series report no assoc GI/ Heartburn symptoms> rec consider trial  max (24h)  acid suppression    ? Anemia/ thyroid dz > needs tsh, req Lab Results  Component Value Date   HGB 10.4 (L) 06/25/2019   HGB 10.1 (L) 06/11/2019   HGB 11.0 (L) 06/04/2019     ? CHF > she def has CM on cxr with ? Edema > recheck bnp  but be aware falsely neg in MO and may benefit from repeat echo    Will ask her to return to complete the w/u.   May need to refer to [place] [facility] to work closer with [facility] oncology going forward as nothing obvious to offer thru this office where we have limited insight into decision risk/benefits of various treatment options being considered which may impact her cardiopulmonary function adversely in a pt with limited reserve due to her severe MO.       Morbid (severe) obesity due to excess calories ([place]) - PFTs 05/20/19 :  ERV  36% c/w effects of obesity on lung volumes  Body mass index is 64.75 kg/m.    No results found for:  TSH   Contributing to gerd risk/ doe/reviewed the need and the process to achieve and maintain neg calorie balance > defer f/u primary care including intermittently monitoring thyroid status            Each maintenance medication was reviewed in detail including emphasizing most importantly the difference between maintenance and prns and under what circumstances the prns are to be triggered using an action plan format where appropriate.  Total time for H and P, chart review, counseling,  directly observing portions of ambulatory 02 saturation study/  and generating customized AVS unique to this office visit / charting = 60 min           Christinia Gully, MD 06/28/2019

## 2019-06-27 NOTE — Patient Instructions (Signed)
Please remember to go to the lab and x-ray department   for your tests.  No need for 0xygen or inhalers at this poin t  I will call you next week to go over all the tests you've had and try to sort out why you are so short of breath and arrange follow up at that point

## 2019-06-28 ENCOUNTER — Encounter: Payer: Self-pay | Admitting: Internal Medicine

## 2019-06-28 DIAGNOSIS — Z6841 Body Mass Index (BMI) 40.0 and over, adult: Secondary | ICD-10-CM | POA: Insufficient documentation

## 2019-06-28 NOTE — Assessment & Plan Note (Addendum)
PFTs 05/20/19 :  ERV  36% c/w effects of obesity on lung volumes  Body mass index is 64.75 kg/m.    No results found for: TSH   Contributing to gerd risk/ doe/reviewed the need and the process to achieve and maintain neg calorie balance > defer f/u primary care including intermittently monitoring thyroid status            Each maintenance medication was reviewed in detail including emphasizing most importantly the difference between maintenance and prns and under what circumstances the prns are to be triggered using an action plan format where appropriate.  Total time for H and P, chart review, counseling,  directly observing portions of ambulatory 02 saturation study/  and generating customized AVS unique to this office visit / charting = 60 min

## 2019-06-28 NOTE — Assessment & Plan Note (Addendum)
Onset 2016 with dx of MM - recurrent doe with dx of recurrent MM Jan 2021 prior to rx  - Echo 02/19/19 nl x for mild LAE  - CTa 02/24/19 no large PE, no ILD  - Neg MR cardiac 03/07/19 neg for amyloid, nl ef  - PFT's  05/20/19   FEV1 1.92 (103 % ) ratio 0.91  p 6 % improvement from saba p nothing prior to study with DLCO  11.73 (62%) corrects to 3.66 (85 %)  for alv volume and FV curve nl and ERV 36%   -  06/27/2019   Walked RA  approx   100 ft  @ slow pace  stopped due to  Sob with sats still 91%   Symptoms are   disproportionate to objective findings and not clear to what extent this is actually a pulmonary  problem but pt does appear to have difficult to sort out respiratory symptoms of unknown origin for which  DDX  = almost all start with A and  include Adherence, Ace Inhibitors, Acid Reflux, Active Sinus Disease, Alpha 1 Antitripsin deficiency, Anxiety masquerading as Airways dz,  ABPA,  Allergy(esp in young), Aspiration (esp in elderly), Adverse effects of meds,  Active smoking or Vaping, A bunch of PE's/clot burden (a few small clots can't cause this syndrome unless there is already severe underlying pulm or vascular dz with poor reserve),  Anemia or thyroid disorder, plus two Bs  = Bronchiectasis and Beta blocker use..and one C= CHF     Adherence is always the initial "prime suspect" and is a multilayered concern that requires a "trust but verify" approach in every patient - starting with knowing how to use medications, especially inhalers, correctly, keeping up with refills and understanding the fundamental difference between maintenance and prns vs those medications only taken for a very short course and then stopped and not refilled.  - really not sure we have accurate/complete medication reconciliation here - rec return Adherence is always the initial "prime suspect" and is a multilayered concern that requires a "trust but verify" approach in every patient - starting with knowing how to use  medications, especially inhalers, correctly, keeping up with refills and understanding the fundamental difference between maintenance and prns vs those medications only taken for a very short course and then stopped and not refilled.   ? Adverse effects of meds  if one of my greatest concerns here > see above, hard to exclude in pt seeing so many providers with so many instructions > no obvious culprits identified by my Hx though Zometa has ILD listed she had taken this prior to CTa that did not show def  ILD though need HRCT to exclude it  ? Allergy/ asthma > nothing to suggest this But I spent extra time with pt today reviewing appropriate use of albuterol for prn use on exertion with the following points: 1) saba is for relief of sob that does not improve by walking a slower pace or resting but rather if the pt does not improve after trying this first. 2) If the pt is convinced, as many are, that saba helps recover from activity faster then it's easy to tell if this is the case by re-challenging : ie stop, take the inhaler, then p 5 minutes try the exact same activity (intensity of workload) that just caused the symptoms and see if they are substantially diminished or not after saba 3) if there is an activity that reproducibly causes the symptoms, try the saba  15 min before the activity on alternate days   If in fact the saba really does help, then fine to continue to use it prn but advised may need to look closer at the maintenance regimen being used to achieve better control of airways disease with exertion.   ? Anxiety/depression/ deconditioning with severe morbid obesity  > usually at the bottom of this list of usual suspects but  may interfere with adherence and also interpretation of response or lack thereof to symptom management which can be quite subjective.   ? A bunch of  PE's > no progression of symptoms since onset with neg CTa for large PE's then  ? Acid (or non-acid) GERD > always  difficult to exclude as up to 75% of pts in some series report no assoc GI/ Heartburn symptoms> rec consider trial  max (24h)  acid suppression    ? Anemia/ thyroid dz > needs tsh, req Lab Results  Component Value Date   HGB 10.4 (L) 06/25/2019   HGB 10.1 (L) 06/11/2019   HGB 11.0 (L) 06/04/2019     ? CHF > she def has CM on cxr with ? Edema > recheck bnp but be aware falsely neg in MO and may benefit from repeat echo    Will ask her to return to complete the w/u.   May need to refer to [place] [facility] to work closer with [facility] oncology going forward as nothing obvious to offer thru this office where we have limited insight into decision risk/benefits of various treatment options being considered which may impact her cardiopulmonary function adversely in a pt with limited reserve due to her severe MO.

## 2019-06-30 ENCOUNTER — Telehealth: Payer: Self-pay | Admitting: Internal Medicine

## 2019-06-30 MED ORDER — FUROSEMIDE 20 MG PO TABS: 20.0000 mg | ORAL_TABLET | Freq: Every day | ORAL | 0 refills | Status: DC | PRN

## 2019-06-30 NOTE — Progress Notes (Signed)
LMTCB

## 2019-06-30 NOTE — Telephone Encounter (Signed)
Spoke with pt and gave message from Dr. Melvyn Novas. How much Lasix do you want her to take. It looks like she was on 20mg  in the past. Pt reported that she does not have a Rx for lasix and we would need to send it in to her pharmacy.MW please advise.   Tanda Rockers, MD  06/28/2019 4:12 PM EDT    Call pt: Reviewed cxr and may suggest too much fluid in lungs - needs to return for bloodwork req on 5/21 and additional studies will be considered p that but in meantime would take lasix daily x 5 days to see if helps breathing or noct "wheeze"       Encounter-Level Documents:  There are no encounter-level documents.

## 2019-06-30 NOTE — Telephone Encounter (Signed)
Stop hydroclorothiazide Take lasix 20 mg one daily x 3 days then daily thereafter  Really needs to get labs asap

## 2019-06-30 NOTE — Telephone Encounter (Signed)
Attempted to call pt but unable to reach. Left message for her to return call. 

## 2019-07-01 NOTE — Telephone Encounter (Signed)
Attempted to call pt x2 but unable to reach. Left message for her to return call. 

## 2019-07-02 ENCOUNTER — Telehealth: Payer: Self-pay | Admitting: Internal Medicine

## 2019-07-02 ENCOUNTER — Inpatient Hospital Stay (HOSPITAL_COMMUNITY): Payer: Medicare Other

## 2019-07-02 ENCOUNTER — Other Ambulatory Visit: Payer: Self-pay

## 2019-07-02 ENCOUNTER — Other Ambulatory Visit (HOSPITAL_COMMUNITY)
Admission: RE | Admit: 2019-07-02 | Discharge: 2019-07-02 | Disposition: A | Discharge status: Home / Self Care | Payer: Medicare Other | Source: Ambulatory Visit | Attending: Internal Medicine | Admitting: Internal Medicine

## 2019-07-02 ENCOUNTER — Encounter (HOSPITAL_COMMUNITY): Payer: Self-pay

## 2019-07-02 VITALS — BP 125/69 | HR 74 | Temp 97.2°F | Resp 20 | Wt 354.8 lb

## 2019-07-02 DIAGNOSIS — R0600 Dyspnea, unspecified: Secondary | ICD-10-CM | POA: Insufficient documentation

## 2019-07-02 DIAGNOSIS — C9000 Multiple myeloma not having achieved remission: Secondary | ICD-10-CM

## 2019-07-02 DIAGNOSIS — Z5112 Encounter for antineoplastic immunotherapy: Secondary | ICD-10-CM | POA: Diagnosis not present

## 2019-07-02 LAB — CBC WITH DIFFERENTIAL/PLATELET
Abs Immature Granulocytes: 0.15 10*3/uL — ABNORMAL HIGH (ref 0.00–0.07)
Basophils Absolute: 0 10*3/uL (ref 0.0–0.1)
Basophils Relative: 1 %
Eosinophils Absolute: 0.2 10*3/uL (ref 0.0–0.5)
Eosinophils Relative: 3 %
HCT: 33.5 % — ABNORMAL LOW (ref 36.0–46.0)
Hemoglobin: 10.6 g/dL — ABNORMAL LOW (ref 12.0–15.0)
Immature Granulocytes: 3 %
Lymphocytes Relative: 14 %
Lymphs Abs: 0.7 10*3/uL (ref 0.7–4.0)
MCH: 31.5 pg (ref 26.0–34.0)
MCHC: 31.6 g/dL (ref 30.0–36.0)
MCV: 99.7 fL (ref 80.0–100.0)
Monocytes Absolute: 0.2 10*3/uL (ref 0.1–1.0)
Monocytes Relative: 4 %
Neutro Abs: 3.8 10*3/uL (ref 1.7–7.7)
Neutrophils Relative %: 75 %
Platelets: 145 10*3/uL — ABNORMAL LOW (ref 150–400)
RBC: 3.36 MIL/uL — ABNORMAL LOW (ref 3.87–5.11)
RDW: 16.6 % — ABNORMAL HIGH (ref 11.5–15.5)
WBC: 5 10*3/uL (ref 4.0–10.5)
nRBC: 0 % (ref 0.0–0.2)

## 2019-07-02 LAB — COMPREHENSIVE METABOLIC PANEL
ALT: 15 U/L (ref 0–44)
AST: 16 U/L (ref 15–41)
Albumin: 3.8 g/dL (ref 3.5–5.0)
Alkaline Phosphatase: 53 U/L (ref 38–126)
Anion gap: 11 (ref 5–15)
BUN: 19 mg/dL (ref 8–23)
CO2: 29 mmol/L (ref 22–32)
Calcium: 9.5 mg/dL (ref 8.9–10.3)
Chloride: 98 mmol/L (ref 98–111)
Creatinine, Ser: 1.09 mg/dL — ABNORMAL HIGH (ref 0.44–1.00)
GFR calc Af Amer: >60 mL/min (ref >60)
GFR calc non Af Amer: 54 mL/min — ABNORMAL LOW (ref >60)
Glucose, Bld: 101 mg/dL — ABNORMAL HIGH (ref 70–99)
Potassium: 3.7 mmol/L (ref 3.5–5.1)
Sodium: 138 mmol/L (ref 135–145)
Total Bilirubin: 0.8 mg/dL (ref 0.3–1.2)
Total Protein: 6.8 g/dL (ref 6.5–8.1)

## 2019-07-02 LAB — BRAIN NATRIURETIC PEPTIDE: B Natriuretic Peptide: 71 pg/mL (ref 0.0–100.0)

## 2019-07-02 LAB — SEDIMENTATION RATE: Sed Rate: 43 mm/hr — ABNORMAL HIGH (ref 0–22)

## 2019-07-02 LAB — TSH: TSH: 0.708 u[IU]/mL (ref 0.350–4.500)

## 2019-07-02 MED ORDER — SODIUM CHLORIDE 0.9 % IV SOLN: Freq: Once | INTRAVENOUS | Status: AC

## 2019-07-02 MED ORDER — DEXTROSE 5 % IV SOLN
54.5000 mg/m2 | Freq: Once | INTRAVENOUS | Status: AC
  Administered 2019-07-02: 120 mg via INTRAVENOUS
  Filled 2019-07-02: qty 60

## 2019-07-02 MED ORDER — SODIUM CHLORIDE 0.9 % IV SOLN
40.0000 mg | Freq: Once | INTRAVENOUS | Status: AC
  Administered 2019-07-02: 40 mg via INTRAVENOUS
  Filled 2019-07-02: qty 4

## 2019-07-02 MED ORDER — SODIUM CHLORIDE 0.9% FLUSH
10.0000 mL | INTRAVENOUS | Status: DC | PRN
  Administered 2019-07-02: 10 mL

## 2019-07-02 MED ORDER — HEPARIN SOD (PORK) LOCK FLUSH 100 UNIT/ML IV SOLN
500.0000 [IU] | Freq: Once | INTRAVENOUS | Status: AC | PRN
  Administered 2019-07-02: 500 [IU]

## 2019-07-02 NOTE — Telephone Encounter (Signed)
Left message for patient to call back.   Called and spoke with Kelli Hurley at [facility]. He stated that they did have the Lasix RX.   Will wait for her to call back to make sure that this is the correct pharmacy.

## 2019-07-02 NOTE — Progress Notes (Signed)
Patient identification verified, results of recent TSH, sed rate, and BNP reviewed. All results are unremarkable per Dr. Melvyn Novas. Patient verbalized understanding of results.

## 2019-07-02 NOTE — Progress Notes (Signed)
Kelli Hurley gave results on 06/30/19 of cxr

## 2019-07-02 NOTE — Telephone Encounter (Signed)
Left a voicemail for patient to call our office back prior message.

## 2019-07-02 NOTE — Telephone Encounter (Signed)
Spoke with patient. She is aware of recommendations. She is currently getting her chemo treatment now at the cancer center and will stop by their lab once she is done.   Nothing further needed at time of call.

## 2019-07-02 NOTE — Patient Instructions (Signed)
[facility] at [facility] Discharge Instructions  Labs drawn from portacath today   Thank you for choosing [facility] at [facility] to provide your oncology and hematology care.  To afford each patient quality time with our provider, please arrive at least 15 minutes before your scheduled appointment time.   If you have a lab appointment with the Cancer Center please come in thru the Main Entrance and check in at the main information desk.  You need to re-schedule your appointment should you arrive 10 or more minutes late.  We strive to give you quality time with our providers, and arriving late affects you and other patients whose appointments are after yours.  Also, if you no show three or more times for appointments you may be dismissed from the clinic at the providers discretion.     Again, thank you for choosing [facility].  Our hope is that these requests will decrease the amount of time that you wait before being seen by our physicians.       _____________________________________________________________  Should you have questions after your visit to [facility], please contact our office at [phone] between the hours of 8:00 a.m. and 4:30 p.m.  Voicemails left after 4:00 p.m. will not be returned until the following business day.  For prescription refill requests, have your pharmacy contact our office and allow 72 hours.    Due to Covid, you will need to wear a mask upon entering the hospital. If you do not have a mask, a mask will be given to you at the Main Entrance upon arrival. For doctor visits, patients may have 1 support person with them. For treatment visits, patients can not have anyone with them due to social distancing guidelines and our immunocompromised population.     

## 2019-07-02 NOTE — Progress Notes (Signed)
Patient tolerated chemotherapy with no complaints voiced.  Side effects with management reviewed with understanding verbalized.  Port site clean and dry with no bruising or swelling noted at site.  Good blood return noted before and after administration of chemotherapy.  Band aid applied.  Patient left by wheelchair with VSS and no s/s of distress noted.  

## 2019-07-03 LAB — PROTEIN ELECTROPHORESIS, SERUM
A/G Ratio: 1.4 (ref 0.7–1.7)
Albumin ELP: 3.4 g/dL (ref 2.9–4.4)
Alpha-1-Globulin: 0.2 g/dL (ref 0.0–0.4)
Alpha-2-Globulin: 0.6 g/dL (ref 0.4–1.0)
Beta Globulin: 1 g/dL (ref 0.7–1.3)
Gamma Globulin: 0.7 g/dL (ref 0.4–1.8)
Globulin, Total: 2.5 g/dL (ref 2.2–3.9)
M-Spike, %: 0.2 g/dL — ABNORMAL HIGH
Total Protein ELP: 5.9 g/dL — ABNORMAL LOW (ref 6.0–8.5)

## 2019-07-03 LAB — KAPPA/LAMBDA LIGHT CHAINS
Kappa free light chain: 26.8 mg/L — ABNORMAL HIGH (ref 3.3–19.4)
Kappa, lambda light chain ratio: 1.41 (ref 0.26–1.65)
Lambda free light chains: 19 mg/L (ref 5.7–26.3)

## 2019-07-03 NOTE — Telephone Encounter (Signed)
ATC patient LMTCB X2 

## 2019-07-04 ENCOUNTER — Ambulatory Visit (HOSPITAL_COMMUNITY)
Admission: RE | Admit: 2019-07-04 | Discharge: 2019-07-04 | Disposition: A | Discharge status: Home / Self Care | Payer: Medicare Other | Source: Ambulatory Visit | Attending: Hematology | Admitting: Hematology

## 2019-07-04 ENCOUNTER — Other Ambulatory Visit: Payer: Self-pay

## 2019-07-04 DIAGNOSIS — Z780 Asymptomatic menopausal state: Secondary | ICD-10-CM | POA: Diagnosis present

## 2019-07-09 ENCOUNTER — Other Ambulatory Visit: Payer: Self-pay

## 2019-07-09 ENCOUNTER — Inpatient Hospital Stay (HOSPITAL_COMMUNITY): Payer: Medicare Other

## 2019-07-09 ENCOUNTER — Inpatient Hospital Stay (HOSPITAL_COMMUNITY): Payer: Medicare Other | Attending: Hematology

## 2019-07-09 ENCOUNTER — Encounter (HOSPITAL_COMMUNITY): Payer: Self-pay

## 2019-07-09 VITALS — BP 143/69 | HR 72 | Temp 97.3°F | Resp 20

## 2019-07-09 DIAGNOSIS — Z79899 Other long term (current) drug therapy: Secondary | ICD-10-CM | POA: Insufficient documentation

## 2019-07-09 DIAGNOSIS — R0602 Shortness of breath: Secondary | ICD-10-CM | POA: Diagnosis not present

## 2019-07-09 DIAGNOSIS — R200 Anesthesia of skin: Secondary | ICD-10-CM | POA: Insufficient documentation

## 2019-07-09 DIAGNOSIS — Z8249 Family history of ischemic heart disease and other diseases of the circulatory system: Secondary | ICD-10-CM | POA: Insufficient documentation

## 2019-07-09 DIAGNOSIS — M2550 Pain in unspecified joint: Secondary | ICD-10-CM | POA: Diagnosis not present

## 2019-07-09 DIAGNOSIS — C9000 Multiple myeloma not having achieved remission: Secondary | ICD-10-CM | POA: Insufficient documentation

## 2019-07-09 DIAGNOSIS — R5383 Other fatigue: Secondary | ICD-10-CM | POA: Insufficient documentation

## 2019-07-09 DIAGNOSIS — Z5112 Encounter for antineoplastic immunotherapy: Secondary | ICD-10-CM | POA: Insufficient documentation

## 2019-07-09 DIAGNOSIS — Z9221 Personal history of antineoplastic chemotherapy: Secondary | ICD-10-CM | POA: Diagnosis not present

## 2019-07-09 DIAGNOSIS — R600 Localized edema: Secondary | ICD-10-CM | POA: Insufficient documentation

## 2019-07-09 DIAGNOSIS — R05 Cough: Secondary | ICD-10-CM | POA: Diagnosis not present

## 2019-07-09 DIAGNOSIS — Z85118 Personal history of other malignant neoplasm of bronchus and lung: Secondary | ICD-10-CM | POA: Insufficient documentation

## 2019-07-09 DIAGNOSIS — Z7952 Long term (current) use of systemic steroids: Secondary | ICD-10-CM | POA: Insufficient documentation

## 2019-07-09 DIAGNOSIS — Z809 Family history of malignant neoplasm, unspecified: Secondary | ICD-10-CM | POA: Diagnosis not present

## 2019-07-09 DIAGNOSIS — G629 Polyneuropathy, unspecified: Secondary | ICD-10-CM | POA: Insufficient documentation

## 2019-07-09 DIAGNOSIS — M7989 Other specified soft tissue disorders: Secondary | ICD-10-CM | POA: Diagnosis not present

## 2019-07-09 LAB — CBC WITH DIFFERENTIAL/PLATELET
Abs Immature Granulocytes: 0.06 10*3/uL (ref 0.00–0.07)
Basophils Absolute: 0.1 10*3/uL (ref 0.0–0.1)
Basophils Relative: 1 %
Eosinophils Absolute: 0.2 10*3/uL (ref 0.0–0.5)
Eosinophils Relative: 6 %
HCT: 31.8 % — ABNORMAL LOW (ref 36.0–46.0)
Hemoglobin: 10.3 g/dL — ABNORMAL LOW (ref 12.0–15.0)
Immature Granulocytes: 2 %
Lymphocytes Relative: 19 %
Lymphs Abs: 0.8 10*3/uL (ref 0.7–4.0)
MCH: 31.8 pg (ref 26.0–34.0)
MCHC: 32.4 g/dL (ref 30.0–36.0)
MCV: 98.1 fL (ref 80.0–100.0)
Monocytes Absolute: 0.6 10*3/uL (ref 0.1–1.0)
Monocytes Relative: 14 %
Neutro Abs: 2.4 10*3/uL (ref 1.7–7.7)
Neutrophils Relative %: 58 %
Platelets: 153 10*3/uL (ref 150–400)
RBC: 3.24 MIL/uL — ABNORMAL LOW (ref 3.87–5.11)
RDW: 15.9 % — ABNORMAL HIGH (ref 11.5–15.5)
WBC: 4 10*3/uL (ref 4.0–10.5)
nRBC: 0 % (ref 0.0–0.2)

## 2019-07-09 LAB — COMPREHENSIVE METABOLIC PANEL
ALT: 15 U/L (ref 0–44)
AST: 15 U/L (ref 15–41)
Albumin: 3.7 g/dL (ref 3.5–5.0)
Alkaline Phosphatase: 47 U/L (ref 38–126)
Anion gap: 10 (ref 5–15)
BUN: 19 mg/dL (ref 8–23)
CO2: 26 mmol/L (ref 22–32)
Calcium: 9.3 mg/dL (ref 8.9–10.3)
Chloride: 102 mmol/L (ref 98–111)
Creatinine, Ser: 0.99 mg/dL (ref 0.44–1.00)
GFR calc Af Amer: >60 mL/min (ref >60)
GFR calc non Af Amer: >60 mL/min (ref >60)
Glucose, Bld: 98 mg/dL (ref 70–99)
Potassium: 3.8 mmol/L (ref 3.5–5.1)
Sodium: 138 mmol/L (ref 135–145)
Total Bilirubin: 0.7 mg/dL (ref 0.3–1.2)
Total Protein: 6.4 g/dL — ABNORMAL LOW (ref 6.5–8.1)

## 2019-07-09 LAB — LACTATE DEHYDROGENASE: LDH: 204 U/L — ABNORMAL HIGH (ref 98–192)

## 2019-07-09 LAB — MAGNESIUM: Magnesium: 1.9 mg/dL (ref 1.7–2.4)

## 2019-07-09 MED ORDER — HEPARIN SOD (PORK) LOCK FLUSH 100 UNIT/ML IV SOLN
500.0000 [IU] | Freq: Once | INTRAVENOUS | Status: AC | PRN
  Administered 2019-07-09: 500 [IU]

## 2019-07-09 MED ORDER — SODIUM CHLORIDE 0.9 % IV SOLN
40.0000 mg | Freq: Once | INTRAVENOUS | Status: AC
  Administered 2019-07-09: 40 mg via INTRAVENOUS
  Filled 2019-07-09: qty 4

## 2019-07-09 MED ORDER — SODIUM CHLORIDE 0.9 % IV SOLN: Freq: Once | INTRAVENOUS | Status: AC

## 2019-07-09 MED ORDER — DEXTROSE 5 % IV SOLN
54.5000 mg/m2 | Freq: Once | INTRAVENOUS | Status: AC
  Administered 2019-07-09: 120 mg via INTRAVENOUS
  Filled 2019-07-09: qty 60

## 2019-07-09 MED ORDER — SODIUM CHLORIDE 0.9% FLUSH
10.0000 mL | INTRAVENOUS | Status: DC | PRN
  Administered 2019-07-09: 10 mL

## 2019-07-09 NOTE — Progress Notes (Signed)
Kelli Hurley tolerated Kelli Hurley infusion well without complaints or incident. Labs reviewed prior to administering this medication. VSS upon discharge. Pt discharged via wheelchair in satisfactory condition

## 2019-07-09 NOTE — Patient Instructions (Signed)
[facility] Discharge Instructions for Patients Receiving Chemotherapy   Beginning January 23rd 2017 lab work for the Cancer Center will be done in the  Main lab at [place] on 1st floor. If you have a lab appointment with the Cancer Center please come in thru the  Main Entrance and check in at the main information desk   Today you received the following chemotherapy agents Kyprolis. Follow-up as scheduled  To help prevent nausea and vomiting after your treatment, we encourage you to take your nausea medication   If you develop nausea and vomiting, or diarrhea that is not controlled by your medication, call the clinic.  The clinic phone number is [phone]. Office hours are Monday-Friday 8:30am-5:00pm.  BELOW ARE SYMPTOMS THAT SHOULD BE REPORTED IMMEDIATELY:  *FEVER GREATER THAN 101.0 F  *CHILLS WITH OR WITHOUT FEVER  NAUSEA AND VOMITING THAT IS NOT CONTROLLED WITH YOUR NAUSEA MEDICATION  *UNUSUAL SHORTNESS OF BREATH  *UNUSUAL BRUISING OR BLEEDING  TENDERNESS IN MOUTH AND THROAT WITH OR WITHOUT PRESENCE OF ULCERS  *URINARY PROBLEMS  *BOWEL PROBLEMS  UNUSUAL RASH Items with * indicate a potential emergency and should be followed up as soon as possible. If you have an emergency after office hours please contact your primary care physician or go to the nearest emergency department.  Please call the clinic during office hours if you have any questions or concerns.   You may also contact the Patient Navigator at [phone] should you have any questions or need assistance in obtaining follow up care.      Resources For Cancer Patients and their Caregivers ? American Cancer Society: Can assist with transportation, wigs, general needs, runs Look Good Feel Better.        1-888-227-6333 ? Cancer Care: Provides financial assistance, online support groups, medication/co-pay assistance.  1-800-813-HOPE (4673) ? [facility] Assists [place] cancer patients and their families through emotional , educational and financial support.  336-427-4357 ? [name] Where to apply for food stamps, Medicaid and utility assistance. 336-342-1394 ? [name]: Transportation to medical appointments. 336-347-2287 ? Social Security Administration: May apply for disability if have a Stage IV cancer. 336-342-7796 1-800-772-1213 ? [place] Aging, Disability and Transit Services: Assists with nutrition, care and transit needs. 336-349-2343         

## 2019-07-09 NOTE — Patient Instructions (Signed)
[facility] at [facility] Discharge Instructions  Labs drawn from portacath today   Thank you for choosing [facility] at [facility] to provide your oncology and hematology care.  To afford each patient quality time with our provider, please arrive at least 15 minutes before your scheduled appointment time.   If you have a lab appointment with the Cancer Center please come in thru the Main Entrance and check in at the main information desk.  You need to re-schedule your appointment should you arrive 10 or more minutes late.  We strive to give you quality time with our providers, and arriving late affects you and other patients whose appointments are after yours.  Also, if you no show three or more times for appointments you may be dismissed from the clinic at the providers discretion.     Again, thank you for choosing [facility].  Our hope is that these requests will decrease the amount of time that you wait before being seen by our physicians.       _____________________________________________________________  Should you have questions after your visit to [facility], please contact our office at [phone] between the hours of 8:00 a.m. and 4:30 p.m.  Voicemails left after 4:00 p.m. will not be returned until the following business day.  For prescription refill requests, have your pharmacy contact our office and allow 72 hours.    Due to Covid, you will need to wear a mask upon entering the hospital. If you do not have a mask, a mask will be given to you at the Main Entrance upon arrival. For doctor visits, patients may have 1 support person with them. For treatment visits, patients can not have anyone with them due to social distancing guidelines and our immunocompromised population.     

## 2019-07-10 ENCOUNTER — Telehealth: Payer: Self-pay | Admitting: Internal Medicine

## 2019-07-10 ENCOUNTER — Other Ambulatory Visit (HOSPITAL_COMMUNITY): Payer: Self-pay | Admitting: *Deleted

## 2019-07-10 LAB — PROTEIN ELECTROPHORESIS, SERUM
A/G Ratio: 1.3 (ref 0.7–1.7)
Albumin ELP: 3.3 g/dL (ref 2.9–4.4)
Alpha-1-Globulin: 0.2 g/dL (ref 0.0–0.4)
Alpha-2-Globulin: 0.6 g/dL (ref 0.4–1.0)
Beta Globulin: 0.9 g/dL (ref 0.7–1.3)
Gamma Globulin: 0.8 g/dL (ref 0.4–1.8)
Globulin, Total: 2.6 g/dL (ref 2.2–3.9)
M-Spike, %: 0.3 g/dL — ABNORMAL HIGH
Total Protein ELP: 5.9 g/dL — ABNORMAL LOW (ref 6.0–8.5)

## 2019-07-10 LAB — KAPPA/LAMBDA LIGHT CHAINS
Kappa free light chain: 24.8 mg/L — ABNORMAL HIGH (ref 3.3–19.4)
Kappa, lambda light chain ratio: 1.37 (ref 0.26–1.65)
Lambda free light chains: 18.1 mg/L (ref 5.7–26.3)

## 2019-07-10 MED ORDER — POMALIDOMIDE 4 MG PO CAPS: 4.0000 mg | ORAL_CAPSULE | Freq: Every day | ORAL | 0 refills | Status: DC

## 2019-07-10 NOTE — Telephone Encounter (Signed)
Attempted to call patient to gather more information, left message to return call.

## 2019-07-10 NOTE — Telephone Encounter (Signed)
Patient reporting that her lasix is not working, please advise.

## 2019-07-10 NOTE — Telephone Encounter (Signed)
Attempted to call pt but unable to reach. Left message for pt to return call. 

## 2019-07-10 NOTE — Telephone Encounter (Signed)
Ok to take 2 daily x 5 days then needs ov with all meds in hand to regroup

## 2019-07-11 NOTE — Telephone Encounter (Signed)
LMTCB x 1 

## 2019-07-11 NOTE — Telephone Encounter (Signed)
Pt returning a phone call. Pt can be reached at [phone].

## 2019-07-14 ENCOUNTER — Telehealth: Payer: Self-pay | Admitting: Internal Medicine

## 2019-07-14 ENCOUNTER — Other Ambulatory Visit (HOSPITAL_COMMUNITY): Payer: Self-pay | Admitting: Hematology

## 2019-07-14 NOTE — Telephone Encounter (Signed)
Called and spoke with patient letting her know that Dr. Melvyn Novas said for her to increase her Lasix and take 2 pills daily for the next 5 days and then OV to follow up. MW is in [place] next week and I gave her the first appt with an APP. We will have a TELEVISIT on 6/17 to follow up with her. Patient expressed understanding. Nothing further needed at this time.

## 2019-07-15 NOTE — Telephone Encounter (Signed)
This was addressed on another message. Will close encounter

## 2019-07-23 ENCOUNTER — Inpatient Hospital Stay (HOSPITAL_BASED_OUTPATIENT_CLINIC_OR_DEPARTMENT_OTHER): Payer: Medicare Other | Admitting: Hematology

## 2019-07-23 ENCOUNTER — Other Ambulatory Visit: Payer: Self-pay

## 2019-07-23 ENCOUNTER — Inpatient Hospital Stay (HOSPITAL_COMMUNITY): Payer: Medicare Other

## 2019-07-23 VITALS — BP 136/72 | HR 71 | Temp 97.9°F | Resp 20

## 2019-07-23 VITALS — BP 132/69 | HR 82 | Temp 97.0°F | Resp 20 | Wt 353.2 lb

## 2019-07-23 DIAGNOSIS — C9000 Multiple myeloma not having achieved remission: Secondary | ICD-10-CM

## 2019-07-23 DIAGNOSIS — C9001 Multiple myeloma in remission: Secondary | ICD-10-CM

## 2019-07-23 DIAGNOSIS — Z5112 Encounter for antineoplastic immunotherapy: Secondary | ICD-10-CM | POA: Diagnosis not present

## 2019-07-23 LAB — CBC WITH DIFFERENTIAL/PLATELET
Abs Immature Granulocytes: 0.02 10*3/uL (ref 0.00–0.07)
Basophils Absolute: 0.1 10*3/uL (ref 0.0–0.1)
Basophils Relative: 3 %
Eosinophils Absolute: 0.1 10*3/uL (ref 0.0–0.5)
Eosinophils Relative: 3 %
HCT: 33.6 % — ABNORMAL LOW (ref 36.0–46.0)
Hemoglobin: 10.8 g/dL — ABNORMAL LOW (ref 12.0–15.0)
Immature Granulocytes: 1 %
Lymphocytes Relative: 23 %
Lymphs Abs: 0.8 10*3/uL (ref 0.7–4.0)
MCH: 31.3 pg (ref 26.0–34.0)
MCHC: 32.1 g/dL (ref 30.0–36.0)
MCV: 97.4 fL (ref 80.0–100.0)
Monocytes Absolute: 0.7 10*3/uL (ref 0.1–1.0)
Monocytes Relative: 20 %
Neutro Abs: 1.8 10*3/uL (ref 1.7–7.7)
Neutrophils Relative %: 50 %
Platelets: 202 10*3/uL (ref 150–400)
RBC: 3.45 MIL/uL — ABNORMAL LOW (ref 3.87–5.11)
RDW: 15.9 % — ABNORMAL HIGH (ref 11.5–15.5)
WBC: 3.6 10*3/uL — ABNORMAL LOW (ref 4.0–10.5)
nRBC: 0 % (ref 0.0–0.2)

## 2019-07-23 LAB — COMPREHENSIVE METABOLIC PANEL
ALT: 16 U/L (ref 0–44)
AST: 14 U/L — ABNORMAL LOW (ref 15–41)
Albumin: 3.7 g/dL (ref 3.5–5.0)
Alkaline Phosphatase: 42 U/L (ref 38–126)
Anion gap: 11 (ref 5–15)
BUN: 20 mg/dL (ref 8–23)
CO2: 29 mmol/L (ref 22–32)
Calcium: 9.6 mg/dL (ref 8.9–10.3)
Chloride: 100 mmol/L (ref 98–111)
Creatinine, Ser: 1.05 mg/dL — ABNORMAL HIGH (ref 0.44–1.00)
GFR calc Af Amer: >60 mL/min (ref >60)
GFR calc non Af Amer: 57 mL/min — ABNORMAL LOW (ref >60)
Glucose, Bld: 98 mg/dL (ref 70–99)
Potassium: 3.8 mmol/L (ref 3.5–5.1)
Sodium: 140 mmol/L (ref 135–145)
Total Bilirubin: 0.5 mg/dL (ref 0.3–1.2)
Total Protein: 6.5 g/dL (ref 6.5–8.1)

## 2019-07-23 MED ORDER — SODIUM CHLORIDE 0.9 % IV SOLN: Freq: Once | INTRAVENOUS | Status: AC

## 2019-07-23 MED ORDER — ZOLEDRONIC ACID 4 MG/100ML IV SOLN
4.0000 mg | Freq: Once | INTRAVENOUS | Status: AC
  Administered 2019-07-23: 4 mg via INTRAVENOUS
  Filled 2019-07-23: qty 100

## 2019-07-23 MED ORDER — SODIUM CHLORIDE 0.9% FLUSH
10.0000 mL | INTRAVENOUS | Status: DC | PRN
  Administered 2019-07-23: 10 mL

## 2019-07-23 MED ORDER — DEXTROSE 5 % IV SOLN
54.5000 mg/m2 | Freq: Once | INTRAVENOUS | Status: AC
  Administered 2019-07-23: 120 mg via INTRAVENOUS
  Filled 2019-07-23: qty 60

## 2019-07-23 MED ORDER — SODIUM CHLORIDE 0.9 % IV SOLN
40.0000 mg | Freq: Once | INTRAVENOUS | Status: AC
  Administered 2019-07-23: 40 mg via INTRAVENOUS
  Filled 2019-07-23: qty 4

## 2019-07-23 MED ORDER — HEPARIN SOD (PORK) LOCK FLUSH 100 UNIT/ML IV SOLN
500.0000 [IU] | Freq: Once | INTRAVENOUS | Status: AC | PRN
  Administered 2019-07-23: 500 [IU]

## 2019-07-23 NOTE — Progress Notes (Signed)
Kelli Hurley presents today for [employer]. Pt denies any new symptoms since last treatment. She is still experiencing shortness of breath with minimal exertion but it improves when she rests. Lab results and vital signs are stable and within parameters for treatment. Also to receive Zometa today. Pt reports taking Ca and Vit D as instructed. Pt denies tooth/jaw pain and denies recent or future invasive dental work. Patient has been assessed by Dr. Delton Coombes who has approved proceeding with treatment today.  Infusions tolerated without incident or complaint. VSS upon completion of treatment. Port flushed and deaccessed per protocol, see MAR and IV flowsheet for details. Discharged in satisfactory condition with follow up instructions.

## 2019-07-23 NOTE — Patient Instructions (Signed)
[facility] Discharge Instructions for Patients Receiving Chemotherapy   Beginning January 23rd 2017 lab work for the Cancer Center will be done in the  Main lab at [place] on 1st floor. If you have a lab appointment with the Cancer Center please come in thru the  Main Entrance and check in at the main information desk   Today you received the following chemotherapy agents Kyprolis  To help prevent nausea and vomiting after your treatment, we encourage you to take your nausea medication If you develop nausea and vomiting, or diarrhea that is not controlled by your medication, call the clinic.  The clinic phone number is [phone]. Office hours are Monday-Friday 8:30am-5:00pm.  BELOW ARE SYMPTOMS THAT SHOULD BE REPORTED IMMEDIATELY:  *FEVER GREATER THAN 101.0 F  *CHILLS WITH OR WITHOUT FEVER  NAUSEA AND VOMITING THAT IS NOT CONTROLLED WITH YOUR NAUSEA MEDICATION  *UNUSUAL SHORTNESS OF BREATH  *UNUSUAL BRUISING OR BLEEDING  TENDERNESS IN MOUTH AND THROAT WITH OR WITHOUT PRESENCE OF ULCERS  *URINARY PROBLEMS  *BOWEL PROBLEMS  UNUSUAL RASH Items with * indicate a potential emergency and should be followed up as soon as possible. If you have an emergency after office hours please contact your primary care physician or go to the nearest emergency department.  Please call the clinic during office hours if you have any questions or concerns.   You may also contact the Patient Navigator at [phone] should you have any questions or need assistance in obtaining follow up care.      Resources For Cancer Patients and their Caregivers ? American Cancer Society: Can assist with transportation, wigs, general needs, runs Look Good Feel Better.        1-888-227-6333 ? Cancer Care: Provides financial assistance, online support groups, medication/co-pay assistance.  1-800-813-HOPE (4673) ? [facility] Assists [place] cancer  patients and their families through emotional , educational and financial support.  336-427-4357 ? [name] Where to apply for food stamps, Medicaid and utility assistance. 336-342-1394 ? [name]: Transportation to medical appointments. 336-347-2287 ? Social Security Administration: May apply for disability if have a Stage IV cancer. 336-342-7796 1-800-772-1213 ? [place] Aging, Disability and Transit Services: Assists with nutrition, care and transit needs. 336-349-2343          

## 2019-07-23 NOTE — Progress Notes (Signed)
Patient has been assessed, vital signs and labs have been reviewed by Dr. Katragadda. ANC, Creatinine, LFTs, and Platelets are within treatment parameters per Dr. Katragadda. The patient is good to proceed with treatment at this time.  

## 2019-07-23 NOTE — Progress Notes (Signed)
[place] [address], [place] [address]   CLINIC:  Medical Oncology/Hematology  PCP:  Alfonse Flavors, MD [address] [place] / [place] [address] [phone]   REASON FOR VISIT:  Follow-up for multiple myeloma.  CURRENT THERAPY: Carfilzomib, pomalidomide and dexamethasone.  BRIEF ONCOLOGIC HISTORY:  Oncology History  Multiple myeloma ([place])  08/07/2014 Imaging   Bone Survey- No lytic lesions are noted in the visualized skeleton.   09/03/2014 Bone Marrow Biopsy   NORMOCELLULAR BONE MARROW FOR AGE WITH PLASMA CELL NEOPLASM.  The plasma cell component is increased in the marrow representing an estimated 18% of all cells. Cytogenetics with 13q-, 17p- (high risk disease)   09/03/2014 Pathology Results   Cytogenetics with 13q-, 17p- (high risk disease)   09/23/2014 Initial Diagnosis   Multiple myeloma   09/30/2014 PET scan   No abnormal hypermetabolism in the neck, chest, abdomen or pelvis.   10/05/2014 - 03/22/2015 Chemotherapy   RVD   10/28/2014 Treatment Plan Change   Issues related to getting Revlimid in a timely fashion, therefore, she received her Revlimid on 9/21 resulting in a 12 day cycle this time instead of a 14 day cycle   11/02/2014 Imaging   CTA chest- No evidence for a large or central pulmonary embolism as described.  8 mm density along the right minor fissure could represent focal pleural thickening but indeterminate. If the patient is at high risk for bronchogenic carcinoma, follow-up c   11/23/2014 Miscellaneous   Zometa 4 mg IV monthly   04/07/2015 Bone Marrow Biopsy   Normocellular marrow with 2-4% clonal plasma cells by immunohistochemistry. FISH and cytogenetics were normal [facility])     04/2015 Miscellaneous   PRETRANSPLANT EVALUATION:  Pulmonary function tests: FEV1 100.3% / DLCO 97.9%  Echocardiogram: Normal LV function with EF 60-65%    04/27/2015 Procedure   Stem cell mobilization with filgrastim and Mozobil [facility])     05/06/2015 Miscellaneous   BMT conditioning regimen with high-dose Melphalan given [name]); Day -1   05/07/2015 Bone Marrow Transplant   Outpatient autologous stem cell transplant [name]); Day 0   05/18/2015 Miscellaneous   WBC engraftment;  did not require platelet transfusion during her transplant process. Lowest platelet count 28,000    05/20/2015 Procedure   Tunneled catheter removed [facility])   09/08/2015 -  Chemotherapy   Velcade every 2 weeks   12/15/2015 Miscellaneous   Zometa re-instituted.    12/23/2015 Imaging   Bone density- BMD as determined from Forearm Radius 33% is 0.799 g/cm2 with a T-Score of 1.2. This patient is considered normal according to [facility]) criteria.   04/17/2016 Miscellaneous   Started maintenance with N[place]o.   05/04/2016 Treatment Plan Change   Zometa switched to Xgeva injection monthly given difficult IV access.    03/05/2019 -  Chemotherapy   The patient had carfilzomib (KYPROLIS) 40 mg in dextrose 5 % 50 mL chemo infusion, 18 mg/m2 = 44 mg, Intravenous,  Once, 5 of 8 cycles Dose modification: 56 mg/m2 (original dose 56 mg/m2, Cycle 1, Reason: Provider Judgment) Administration: 40 mg (03/05/2019), 120 mg (03/12/2019), 120 mg (03/20/2019), 120 mg (04/02/2019), 120 mg (04/09/2019), 120 mg (04/16/2019), 120 mg (04/30/2019), 120 mg (05/08/2019), 120 mg (05/14/2019), 120 mg (05/28/2019), 120 mg (06/04/2019), 120 mg (06/11/2019), 120 mg (06/25/2019), 120 mg (07/02/2019), 120 mg (07/09/2019)  for chemotherapy treatment.      CANCER STAGING: Cancer Staging Multiple myeloma ([place]) Staging form: Multiple Myeloma,  AJCC 6th Edition - Clinical stage from 10/26/2014: Stage IIA - Signed by Baird Cancer, PA-C on 10/26/2014 - Pathologic: No stage assigned - Unsigned   INTERVAL HISTORY:  Ms. JULENA BARBOUR, a 64 y.o. female, returns for routine follow-up and consideration for next cycle of chemotherapy. Seini was last  seen on 06/25/2019.  Due for cycle #6 of carfilzomib today.   Overall, she tells me she has been feeling short of breath with exertion, but not at rest. She still has a productive cough with clear sputum. She denies having any N/V/D or constipation, but reports tingling in her right hand and swollen legs and feet. She is still taking the steroid. She will start her Pomalyst tonight.  Overall, she feels ready for next cycle of chemo today.    REVIEW OF SYSTEMS:  Review of Systems  Constitutional: Positive for fatigue. Negative for appetite change.  Respiratory: Positive for shortness of breath.   Cardiovascular: Positive for leg swelling (leg & feet swelling).  Gastrointestinal: Negative for abdominal pain, constipation, diarrhea, nausea and vomiting.  Musculoskeletal: Positive for arthralgias (9/10 bilat knees).  Neurological: Positive for numbness (R hand).  All other systems reviewed and are negative.   PAST MEDICAL/SURGICAL HISTORY:  Past Medical History:  Diagnosis Date  . Anemia   . Arthritis   . Cancer ([place])   . Claustrophobia 10/05/2014  . Hypertension   . Leukopenia 08/03/2014  . Normocytic hypochromic anemia 08/03/2014  . Renal disorder    stage 3    Past Surgical History:  Procedure Laterality Date  . ABDOMINAL HYSTERECTOMY    . CARDIAC SURGERY    . OTHER SURGICAL HISTORY     heart surgery as infant to "repair hole in heart"  . PORTACATH PLACEMENT Left 02/21/2019   Procedure: INSERTION PORT-A-CATH;  Surgeon: Virl Cagey, MD;  Location: [facility];  Service: General;  Laterality: Left;    SOCIAL HISTORY:  Social History   Socioeconomic History  . Marital status: Legally Separated    Spouse name: Not on file  . Number of children: Not on file  . Years of education: Not on file  . Highest education level: Not on file  Occupational History  . Not on file  Tobacco Use  . Smoking status: Never Smoker  . Smokeless tobacco: Never Used  Vaping Use  . Vaping  Use: Never used  Substance and Sexual Activity  . Alcohol use: No  . Drug use: No  . Sexual activity: Not on file    Comment: divorced- 2 daughters  Other Topics Concern  . Not on file  Social History Narrative  . Not on file   Social Determinants of Health   Financial Resource Strain:   . Difficulty of Paying Living Expenses:   Food Insecurity:   . Worried About Charity fundraiser in the Last Year:   . Arboriculturist in the Last Year:   Transportation Needs:   . Film/video editor (Medical):   Marland Kitchen Lack of Transportation (Non-Medical):   Physical Activity:   . Days of Exercise per Week:   . Minutes of Exercise per Session:   Stress:   . Feeling of Stress :   Social Connections:   . Frequency of Communication with Friends and Family:   . Frequency of Social Gatherings with Friends and Family:   . Attends Religious Services:   . Active Member of Clubs or Organizations:   . Attends Archivist Meetings:   .  Marital Status:   Intimate Partner Violence:   . Fear of Current or Ex-Partner:   . Emotionally Abused:   Marland Kitchen Physically Abused:   . Sexually Abused:     FAMILY HISTORY:  Family History  Problem Relation Age of Onset  . Cancer Mother   . Hypertension Mother   . Cancer Father   . Hypertension Father   . Cancer Maternal Grandmother   . Hypertension Sister   . Hypertension Brother     CURRENT MEDICATIONS:  Current Outpatient Medications  Medication Sig Dispense Refill  . acyclovir (ZOVIRAX) 400 MG tablet Take 1 tablet (400 mg total) by mouth 2 (two) times daily. 60 tablet 5  . Ascorbic Acid (VITAMIN C) 1000 MG tablet Take 1,000 mg by mouth daily.     Marland Kitchen aspirin EC 81 MG tablet Take 81 mg by mouth daily.    . calcium carbonate (OS-CAL) 1250 (500 Ca) MG chewable tablet Chew 1 tablet by mouth daily.     Marland Kitchen CARFILZOMIB IV Inject into the vein.    . cholecalciferol (VITAMIN D) 1000 units tablet Take 1,000 Units by mouth daily.    Marland Kitchen dexamethasone  (DECADRON) 4 MG tablet Take 10 tablets (40 mg total) by mouth once a week. On the week you are off of treatment 10 tablet 3  . gabapentin (NEURONTIN) 300 MG capsule Take 1 capsule (300 mg total) by mouth 3 (three) times daily. (Patient taking differently: Take 300 mg by mouth 2 (two) times daily. ) 90 capsule 3  . hydrochlorothiazide (HYDRODIURIL) 25 MG tablet TAKE 1 TABLET BY MOUTH ONCE DAILY. (Patient taking differently: Take 25 mg by mouth daily. ) 30 tablet 0  . magnesium oxide (MAG-OX) 400 (241.3 Mg) MG tablet TAKE 1 TABLET BY MOUTH TWICE DAILY 60 tablet 6  . Multiple Vitamin (TAB-A-VITE) TABS TAKE 1 TABLET BY MOUTH ONCE DAILY. (Patient taking differently: Take 1 tablet by mouth daily. ) 30 tablet 11  . pomalidomide (POMALYST) 4 MG capsule Take 1 capsule (4 mg total) by mouth daily. 21 capsule 0  . trolamine salicylate (ASPERCREME) 10 % cream Apply 1 application topically 2 (two) times daily.    Marland Kitchen acetaminophen (TYLENOL 8 HOUR ARTHRITIS PAIN) 650 MG CR tablet Take 650 mg by mouth every 8 (eight) hours as needed for pain. (Patient not taking: Reported on 07/23/2019)    . albuterol (VENTOLIN HFA) 108 (90 Base) MCG/ACT inhaler Inhale 2 puffs into the lungs every 6 (six) hours as needed for wheezing or shortness of breath. (Patient not taking: Reported on 07/23/2019) 8 g 3  . furosemide (LASIX) 20 MG tablet Take 1 tablet (20 mg total) by mouth daily as needed for edema (take as needed for swelling). One daily in place of hydrochlorothiazide (Patient not taking: Reported on 07/23/2019) 30 tablet 0  . ibuprofen (ADVIL) 400 MG tablet Take 1 tablet (400 mg total) by mouth every 8 (eight) hours as needed. (Patient not taking: Reported on 07/23/2019) 90 tablet 0  . lidocaine-prilocaine (EMLA) cream Apply a quarter sized glob 1 hour prior to treatment. (Patient not taking: Reported on 07/23/2019) 30 g 5  . prochlorperazine (COMPAZINE) 10 MG tablet Take 1 tablet (10 mg total) by mouth every 6 (six) hours as needed  for nausea or vomiting. (Patient not taking: Reported on 07/23/2019) 60 tablet 2   No current facility-administered medications for this visit.   Facility-Administered Medications Ordered in Other Visits  Medication Dose Route Frequency Provider Last Rate Last Admin  . heparin  lock flush 100 unit/mL  500 Units Intravenous Once Penland, Larene Beach K, MD      . sodium chloride 0.9 % injection 10 mL  10 mL Intravenous Once Penland, Kelby Fam, MD        ALLERGIES:  Allergies  Allergen Reactions  . Diclofenac Swelling    Per pt, facial swelling    PHYSICAL EXAM:  Performance status (ECOG): 1 - Symptomatic but completely ambulatory  Vitals:   07/23/19 0940  BP: 132/69  Pulse: 82  Resp: 20  Temp: (!) 97 F (36.1 C)  SpO2: 99%   Wt Readings from Last 3 Encounters:  07/23/19 (!) 353 lb 3.2 oz (160.2 kg)  07/09/19 (!) 359 lb (162.8 kg)  07/02/19 (!) 354 lb 12.8 oz (160.9 kg)   Physical Exam Vitals reviewed.  Constitutional:      Appearance: Normal appearance. She is obese.  Cardiovascular:     Rate and Rhythm: Normal rate and regular rhythm.     Pulses: Normal pulses.     Heart sounds: Normal heart sounds.  Pulmonary:     Effort: Pulmonary effort is normal.     Breath sounds: Normal breath sounds.  Abdominal:     Palpations: Abdomen is soft. There is no mass.     Tenderness: There is no abdominal tenderness.  Musculoskeletal:     Right lower leg: Edema (+1) present.     Left lower leg: Edema (+1) present.     Comments: Port on L shoulder  Neurological:     General: No focal deficit present.     Mental Status: She is alert and oriented to person, place, and time.  Psychiatric:        Mood and Affect: Mood normal.        Behavior: Behavior normal.     LABORATORY DATA:  I have reviewed the labs as listed.  CBC Latest Ref Rng & Units 07/23/2019 07/09/2019 07/02/2019  WBC 4.0 - 10.5 K/uL 3.6(L) 4.0 5.0  Hemoglobin 12.0 - 15.0 g/dL 10.8(L) 10.3(L) 10.6(L)  Hematocrit 36 - 46  % 33.6(L) 31.8(L) 33.5(L)  Platelets 150 - 400 K/uL 202 153 145(L)   CMP Latest Ref Rng & Units 07/23/2019 07/09/2019 07/02/2019  Glucose 70 - 99 mg/dL 98 98 101(H)  BUN 8 - 23 mg/dL _0 Creatinine 0.44 - 1.00 mg/dL 1.05(H) 0.99 1.09(H)  Sodium 135 - 145 mmol/L 140 138 138  Potassium 3.5 - 5.1 mmol/L 3.8 3.8 3.7  Chloride 98 - 111 mmol/L 100 102 98  CO2 22 - 32 mmol/L _1 Calcium 8.9 - 10.3 mg/dL 9.6 9.3 9.5  Total Protein 6.5 - 8.1 g/dL 6.5 6.4(L) 6.8  Total Bilirubin 0.3 - 1.2 mg/dL 0.5 0.7 0.8  Alkaline Phos 38 - 126 U/L 42 47 53  AST 15 - 41 U/L 14(L) 15 16  ALT 0 - 44 U/L _2 Lab Results  Component Value Date   LDH 204 (H) 07/09/2019   LDH 277 (H) 06/04/2019   LDH 246 (H) 05/14/2019   Lab Results  Component Value Date   TOTALPROTELP 5.9 (L) 07/09/2019   ALBUMINELP 3.3 07/09/2019   A1GS 0.2 07/09/2019   A2GS 0.6 07/09/2019   BETS 0.9 07/09/2019   GAMS 0.8 07/09/2019   MSPIKE 0.3 (H) 07/09/2019   SPEI Comment 07/09/2019    Lab Results  Component Value Date   KPAFRELGTCHN 24.8 (H) 07/09/2019   LAMBDASER 18.1 07/09/2019   KAPLAMBRATIO 1.37 07/09/2019  DIAGNOSTIC IMAGING:  I have independently reviewed the scans and discussed with the patient. DG Chest 2 View  Result Date: 06/28/2019 CLINICAL DATA:  Dyspnea on exertion, history of lung carcinoma EXAM: CHEST - 2 VIEW COMPARISON:  02/21/2019 FINDINGS: Cardiac shadow is stable but enlarged. Left chest wall port is again seen and stable. The lungs are well aerated bilaterally. Mild central vascular congestion is seen. No focal infiltrate or effusion is seen. No bony abnormality is noted. IMPRESSION: Central vascular congestion. Electronically Signed   By: Inez Catalina M.D.   On: 06/28/2019 15:26   DG Bone Density Peripheral Skeleton  Result Date: 07/07/2019 EXAM: DUAL X-RAY ABSORPTIOMETRY (DXA) FOR BONE MINERAL DENSITY IMPRESSION: Your patient Kelli Hurley completed a BMD test on 07/04/2019 using  the [employer] (software version: 14.10) manufactured by [employer]. The following summarizes the results of our evaluation. Technologist:[facility] PATIENT BIOGRAPHICAL: Name: Felicity, Penix Patient ID: [id number] Birth Date: July 30, 1955 Height: 62.0 in. Gender: Female Exam Date: 07/04/2019 Weight: 354.8 lbs. Indications: Bilateral Oophrectomy, Chronic Steroid Use, Post Menopausal Fractures: Treatments: Asprin, Calcium, Multivitamin, Vitamin D DENSITOMETRY RESULTS: Site         Region     Measured Date Measured Age WHO Classification Young Adult T-score BMD         %Change vs. Previous Significant Change (*) Left Forearm Radius 33% 07/04/2019 62.5 Normal 1.8 0.840 g/cm2 5.1% - Left Forearm Radius 33% 12/23/2015 59.0 Normal 1.2 0.799 g/cm2 - - ASSESSMENT: The BMD measured at Forearm Radius 33% is 0.840 g/cm2 with a T-score of 1.8. This patient is considered normal according to [facility]) criteria. The scan quality is good. Forearmonly performed due to patient exceeding table limit. World Pharmacologist [facility]) criteria for post-menopausal, Caucasian Women: Normal:       T-score at or above -1 SD Osteopenia:   T-score between -1 and -2.5 SD Osteoporosis: T-score at or below -2.5 SD RECOMMENDATIONS: 1. All patients should optimize calcium and vitamin D intake. 2. Consider FDA-approved medical therapies in postmenopausal women and med aged 75 years and older, based on the following: a. A hip or vertebral (clinical or morphometric) fracture b. T-score< -2.5 at the femoral neck or spine after appropriate evaluation to exclude secondary causes c. Low bone mass (T-score between -1.0 and -2.5 at the femoral neck or spine) and a 10-year probability of a hip fracture > 3% or a 10-year probability of a major osteoporosis-related fracture > 20% based on the [place]-adapted WHO algorithm d. Clinician judgment and/or patient preferences may indicate treatment for people with 10-year  fracture probabilities above or below these levels FOLLOW-UP: People with diagnosed cases of osteoporosis or at high risk for fracture should have regular bone mineral density tests. For patients eligible for Medicare, routine testing is allowed once every 2 years. The testing frequency can be increased to one year for patients who have rapidly progressing disease, those who are receiving or discontinuing medical therapy to restore bone mass, or have additional risk factors. I have reviewed this report, and agree with the above findings. [facility] Electronically Signed   By: Ilona Sorrel M.D.   On: 07/07/2019 10:42     ASSESSMENT:  1.  Relapsed IgG kappa multiple myeloma with high risk features: -5 cycles of carfilzomib, pomalidomide and dexamethasone from 03/05/2019 through 06/25/2019. -Myeloma panel on 07/09/2019 shows M spike 0.3 g, slightly up from 0.2 g previously.  However free light chain ratio has 1.37 and improved.  Kappa light chains are 24.8.  2.  Shortness of breath on exertion: -2D echo on 02/09/2019 shows EF 60-65%.  No LVH. -Chest CT PE protocol on 02/24/2019 was negative. -Cardiac MRI on 03/07/2019 shows LVEF 56% with no amyloid.  Troponin T and proBNP were negative. -PFTs show low expiratory reserve volume consistent with body habitus.  Moderate diffusion defect which corrects to normal.  FVC, FEV1, FEV1/FVC ratio are within normal limits.  FVC is reduced relative to SVC indicates air-trapping.  No significant response after bronchodilators.  Reduced diffusion capacity indicates moderate loss of functional alveolar capillary space. -She was evaluated by Dr. Melvyn Novas.  She has another follow-up with him soon.   PLAN:  1.  Relapsed IgG kappa multiple myeloma with high risk features: -I have reviewed her myeloma labs.  I have also reviewed routine labs which showed white count of 3.6, with ANC of 1.8. -She will start her pomalidomide today.  She will proceed with cycle 6-day 1  today. -Plan to reevaluate in 4 weeks prior to start of next cycle.  2.  Shortness of breath on exertion: -She was told to increase Lasix.  She has follow-up with pulmonology.  3.  ID prophylaxis: -Continue acyclovir twice daily.  Continue aspirin for thromboprophylaxis.  4.  Neuropathy: -Continue gabapentin 300 mg 3 times a day for right hand neuropathy.  5.  Lower extremity edema: -She takes Lasix as needed.  She is also taking HCTZ as needed.  6.  Myeloma bone disease: -Bone density on 07/04/2019 shows T score 1.8. -Zometa started back on 05/26/2019 and is tolerating well.    Orders placed this encounter:  No orders of the defined types were placed in this encounter.    Derek Jack, MD [place] [phone]   I, Milinda Antis, am acting as a scribe for Dr. Sanda Linger.  I, Derek Jack MD, have reviewed the above documentation for accuracy and completeness, and I agree with the above.

## 2019-07-23 NOTE — Patient Instructions (Addendum)
[place] at [facility] Discharge Instructions  You were seen today by Dr. Delton Coombes. He went over your recent results. Please continue your routine care with your primary care provider. Dr. Delton Coombes will see you back in 1 month for labs and follow up.   Thank you for choosing [place] at [facility] to provide your oncology and hematology care.  To afford each patient quality time with our provider, please arrive at least 15 minutes before your scheduled appointment time.   If you have a lab appointment with the [place] [facility] please come in thru the Main Entrance and check in at the main information desk  You need to re-schedule your appointment should you arrive 10 or more minutes late.  We strive to give you quality time with our providers, and arriving late affects you and other patients whose appointments are after yours.  Also, if you no show three or more times for appointments you may be dismissed from the clinic at the providers discretion.     Again, thank you for choosing [facility].  Our hope is that these requests will decrease the amount of time that you wait before being seen by our physicians.       _____________________________________________________________  Should you have questions after your visit to [facility], please contact our office at [phone] between the hours of 8:00 a.m. and 4:30 p.m.  Voicemails left after 4:00 p.m. will not be returned until the following business day.  For prescription refill requests, have your pharmacy contact our office and allow 72 hours.    Cancer Center Support Programs:   > Cancer Support Group  2nd Tuesday of the month 1pm-2pm, Journey Room

## 2019-07-24 ENCOUNTER — Ambulatory Visit (INDEPENDENT_AMBULATORY_CARE_PROVIDER_SITE_OTHER): Payer: Medicare Other | Admitting: Adult Health

## 2019-07-24 ENCOUNTER — Encounter: Payer: Self-pay | Admitting: Adult Health

## 2019-07-24 DIAGNOSIS — R0600 Dyspnea, unspecified: Secondary | ICD-10-CM | POA: Diagnosis not present

## 2019-07-24 DIAGNOSIS — R0609 Other forms of dyspnea: Secondary | ICD-10-CM

## 2019-07-24 NOTE — Progress Notes (Signed)
Virtual Visit via Telephone Note  I connected with Kelli Hurley on 07/24/19 at  9:00 AM EDT by telephone and verified that I am speaking with the correct person using two identifiers.  Location: Patient: Home  Provider: Home Office    I discussed the limitations, risks, security and privacy concerns of performing an evaluation and management service by telephone and the availability of in person appointments. I also discussed with the patient that there may be a patient responsible charge related to this service. The patient expressed understanding and agreed to proceed.   History of Present Illness: 64 yo female with minimal smoking history seen for pulmonary consult 06/2019 for dyspnea.  Carries diagnosis of asthma and allergies.  Medical history significant for Multiple Myeloma followed by Oncology -currently on Carifilzomib and Xgeva   Today's televisit is a 1 month follow-up.  Patient was seen last month for a pulmonary consult for dyspnea.  Patient had previously been seen by Dr. Luan Pulling for asthma and allergies.  Patient has a known history of multiple myeloma undergoing current therapy.  She complains over the last several months she has had increased shortness of breath with activity decreased activity tolerance.  This seemed to get worse around November 2020.  She relates it to going back on therapy for her multiple myeloma.  A CT chest was done in January that was negative for PE or acute process.  Patient did have increased cough and congestion last month and was given a Z-Pak.  Chest x-ray showed no pneumonia.  Some vascular congestion.  Last visit patient was recommended to begin Lasix 40 mg for 5 days.  Patient says this really helped her lower extremity swelling got substantially better.  However she is back on hydrochlorothiazide and noticed that her ankle swelling has returned.  Patient complains of minimal cough.  Has intermittent wheezing.  She uses albuterol on occasion    Observations/Objective: Chest x-ray Jun 28, 2019 clear lungs.  Mild central vascular congestion is noted.  No acute infiltrate or effusion noted.  CT chest February 24, 2019 - for PE.  8 mm perifissural nodule in the right lung consistent with benign etiology.  Right thyroid nodule.  CBC July 23, 2019 hemoglobin 10.8 stable WBC 3.6 slightly decreased, platelets 202, LFTs normal  2D echo January 2021 was normal except for mild left atrium enlargement.  Cardiac MRI March 07, 2019 - for amyloid   PFT's  05/20/19   FEV1 1.92 (103 % ) ratio 0.91  p 6 % improvement from saba p nothing prior to study with DLCO  11.73 (62%) corrects to 3.66 (85 %)  for alv volume and FV curve nl and ERV 36%      06/27/2019   Walked RA  approx   100 ft  @ slow pace  stopped due to  Sob with sats still 91%   Jul 02, 2019 TSH and BNP normal, ESR decreased at 43 (previously at 55)  Assessment and Plan: Dyspnea questionable etiology suspect is multifactorial with multiple comorbidities and undergoing treatment for multiple myeloma.  Work-up has been unrevealing with a negative CT chest no evidence of pneumonitis or PE.  2D echo January 2021 showed normal EF.  Walk test last month in the office with no desaturations.  Lab work with normal BNP and TSH.  ESR was elevated but down from couple years ago. We will check an overnight oximetry test.  To see if she has nocturnal hypoxemia. May have some diastolic dysfunction.  Will change back to Lasix as she had perceived benefit.  Have close follow-up in 2 weeks.  May need to consider a repeat CT chest.  On return visit could consider adding in an inhaler such as Symbicort or Dulera.  For now continue on albuterol as needed.  Plan  Patient Instructions  Stop Hydrochlorothiazide .  Restart Lasix 75m daily , take extra if needed for swelling .  Set up Overnight oximetry test  Albuterol Inhaler As needed  Wheezing  Follow up with Dr. [place]elvyn Novas [place] 2 weeks and  As needed   Please contact office for sooner follow up if symptoms do not improve or worsen or seek emergency care        Follow Up Instructions: Follow-up in 2 weeks with Dr. WMelvyn Novasand as needed Please contact office for sooner follow up if symptoms do not improve or worsen or seek emergency care     I discussed the assessment and treatment plan with the patient. The patient was provided an opportunity to ask questions and all were answered. The patient agreed with the plan and demonstrated an understanding of the instructions.   The patient was advised to call back or seek an in-person evaluation if the symptoms worsen or if the condition fails to improve as anticipated.  I provided 26  minutes of non-face-to-face time during this encounter.   TRexene Edison NP

## 2019-07-24 NOTE — Addendum Note (Signed)
Addended by: Desmond Dike C on: 07/24/2019 02:12 PM   Modules accepted: Orders

## 2019-07-24 NOTE — Patient Instructions (Addendum)
Stop Hydrochlorothiazide .  Restart Lasix 20mg  daily , take extra if needed for swelling .  Set up Overnight oximetry test  Albuterol Inhaler As needed  Wheezing  Follow up with Dr. Melvyn Novas  In [place] in 2 weeks and As needed   Please contact office for sooner follow up if symptoms do not improve or worsen or seek emergency care

## 2019-07-30 ENCOUNTER — Other Ambulatory Visit: Payer: Self-pay

## 2019-07-30 ENCOUNTER — Inpatient Hospital Stay (HOSPITAL_COMMUNITY): Payer: Medicare Other

## 2019-07-30 ENCOUNTER — Encounter: Payer: Self-pay | Admitting: Gastroenterology

## 2019-07-30 VITALS — BP 145/74 | HR 73 | Temp 98.2°F | Resp 20

## 2019-07-30 DIAGNOSIS — C9000 Multiple myeloma not having achieved remission: Secondary | ICD-10-CM

## 2019-07-30 DIAGNOSIS — Z5112 Encounter for antineoplastic immunotherapy: Secondary | ICD-10-CM | POA: Diagnosis not present

## 2019-07-30 LAB — COMPREHENSIVE METABOLIC PANEL
ALT: 15 U/L (ref 0–44)
AST: 14 U/L — ABNORMAL LOW (ref 15–41)
Albumin: 3.6 g/dL (ref 3.5–5.0)
Alkaline Phosphatase: 42 U/L (ref 38–126)
Anion gap: 10 (ref 5–15)
BUN: 21 mg/dL (ref 8–23)
CO2: 27 mmol/L (ref 22–32)
Calcium: 8.9 mg/dL (ref 8.9–10.3)
Chloride: 103 mmol/L (ref 98–111)
Creatinine, Ser: 1.07 mg/dL — ABNORMAL HIGH (ref 0.44–1.00)
GFR calc Af Amer: >60 mL/min (ref >60)
GFR calc non Af Amer: 56 mL/min — ABNORMAL LOW (ref >60)
Glucose, Bld: 124 mg/dL — ABNORMAL HIGH (ref 70–99)
Potassium: 3.6 mmol/L (ref 3.5–5.1)
Sodium: 140 mmol/L (ref 135–145)
Total Bilirubin: 0.7 mg/dL (ref 0.3–1.2)
Total Protein: 6.2 g/dL — ABNORMAL LOW (ref 6.5–8.1)

## 2019-07-30 LAB — CBC WITH DIFFERENTIAL/PLATELET
Abs Immature Granulocytes: 0.15 10*3/uL — ABNORMAL HIGH (ref 0.00–0.07)
Basophils Absolute: 0 10*3/uL (ref 0.0–0.1)
Basophils Relative: 1 %
Eosinophils Absolute: 0 10*3/uL (ref 0.0–0.5)
Eosinophils Relative: 1 %
HCT: 31.7 % — ABNORMAL LOW (ref 36.0–46.0)
Hemoglobin: 10 g/dL — ABNORMAL LOW (ref 12.0–15.0)
Immature Granulocytes: 3 %
Lymphocytes Relative: 13 %
Lymphs Abs: 0.7 10*3/uL (ref 0.7–4.0)
MCH: 31.4 pg (ref 26.0–34.0)
MCHC: 31.5 g/dL (ref 30.0–36.0)
MCV: 99.7 fL (ref 80.0–100.0)
Monocytes Absolute: 0.2 10*3/uL (ref 0.1–1.0)
Monocytes Relative: 4 %
Neutro Abs: 4.4 10*3/uL (ref 1.7–7.7)
Neutrophils Relative %: 78 %
Platelets: 152 10*3/uL (ref 150–400)
RBC: 3.18 MIL/uL — ABNORMAL LOW (ref 3.87–5.11)
RDW: 16.1 % — ABNORMAL HIGH (ref 11.5–15.5)
WBC: 5.5 10*3/uL (ref 4.0–10.5)
nRBC: 0 % (ref 0.0–0.2)

## 2019-07-30 MED ORDER — DEXTROSE 5 % IV SOLN
54.5000 mg/m2 | Freq: Once | INTRAVENOUS | Status: AC
  Administered 2019-07-30: 120 mg via INTRAVENOUS
  Filled 2019-07-30: qty 60

## 2019-07-30 MED ORDER — SODIUM CHLORIDE 0.9 % IV SOLN
40.0000 mg | Freq: Once | INTRAVENOUS | Status: AC
  Administered 2019-07-30: 40 mg via INTRAVENOUS
  Filled 2019-07-30: qty 4

## 2019-07-30 MED ORDER — HEPARIN SOD (PORK) LOCK FLUSH 100 UNIT/ML IV SOLN
500.0000 [IU] | Freq: Once | INTRAVENOUS | Status: AC | PRN
  Administered 2019-07-30: 500 [IU]

## 2019-07-30 MED ORDER — SODIUM CHLORIDE 0.9 % IV SOLN: Freq: Once | INTRAVENOUS | Status: AC

## 2019-07-30 MED ORDER — SODIUM CHLORIDE 0.9% FLUSH
10.0000 mL | INTRAVENOUS | Status: DC | PRN
  Administered 2019-07-30: 10 mL

## 2019-07-30 NOTE — Progress Notes (Signed)
Patient presents today for treatment. Vital signs within parameters for treatment. Labs within parameters for treatment. Labs reviewed by [facility] NP. Verbal order received to proceed with treatment. Patient has no complaints of any changes since her last treatment.   Patient taking Pomalyst 4mg  daily as prescribed with no side effects or problems noted.   Treatment given today per MD orders. Tolerated infusion without adverse affects. Vital signs stable. No complaints at this time. Discharged from clinic via wheel chair. F/U with [facility] as scheduled.

## 2019-07-30 NOTE — Progress Notes (Signed)
Primary Care Physician:  Zhou-Talbert, Elwyn Lade, MD Primary Gastroenterologist:  Dr. Oneida Alar (Dr. Gala Romney following for now)  Chief Complaint  Patient presents with  . Consult    TCS last done 2011    HPI:   Kelli Hurley is a 64 y.o. female with medical history of HTN, arthritis, mild renal insufficiency, multiple myeloma currently following with oncology and undergoing treatment.Recently with worsening dyspnea/decreased activity tolerance suspected to be multifactorial in the setting of multiple comorbidities and undergoing treatment for multiple myeloma.  Pulmonary started her on Lasix 20 mg daily and arranged overnight oximetry test at her last visit on 07/24/19. Plans to follow-up in 2 weeks although looks like she isn't scheduled until 7/27.   She is presenting today to discuss scheduling colonoscopy. Last colonoscopy 03/26/2009 with normal colon, small internal hemorrhoids.  Recommend repeat colonoscopy in 10 years.  Today:  No abdominal pain. BMs daily. No constipation or diarrhea. No blood in the stool. No black stool. No N/V, GERD symptoms, or dysphagia.  Chemo is going well. Has chemo every Wednesday x3 then stops for 7 days. Having shortness of breath. Returned when multiple myeloma returned in January 2021. SOB has been worsening.  Seeing pulmonologist. Has sleep study scheduled for tonight. No shortness of breath at rest. Significant SOB with minimal exertion. Intermittent cough. This has improved. Inhaler hasn't helped much with SOB.  Had similar symptoms 3 years ago when she had active multiple myeloma and was undergoing chemotherapy.  Symptoms resolved when she went into remission.  Has been gaining weight and having peripheral edema since she started the steroid which is with her chemo. Taking Lasix which is helping.    No CP or palpitations. No pre-syncope or syncope. No fever, chills, lightheadedness, dizziness, presyncope, syncope.  Past Medical History:  Diagnosis  Date  . Anemia   . Arthritis   . Cancer ([place])   . Claustrophobia 10/05/2014  . Hypertension   . Leukopenia 08/03/2014  . Normocytic hypochromic anemia 08/03/2014  . Renal disorder    stage 3     Past Surgical History:  Procedure Laterality Date  . ABDOMINAL HYSTERECTOMY    . CARDIAC SURGERY     23 months old. States she had a leaky valve.   . COLONOSCOPY  03/26/2009   Dr. Oneida Alar; normal colon, small internal hemorrhoids.  Recommended repeat colonoscopy in 10 years.  . OTHER SURGICAL HISTORY     heart surgery as infant to "repair hole in heart"  . PORTACATH PLACEMENT Left 02/21/2019   Procedure: INSERTION PORT-A-CATH;  Surgeon: Virl Cagey, MD;  Location: [facility];  Service: General;  Laterality: Left;    Current Outpatient Medications  Medication Sig Dispense Refill  . acetaminophen (TYLENOL 8 HOUR ARTHRITIS PAIN) 650 MG CR tablet Take 650 mg by mouth every 8 (eight) hours as needed for pain.     Marland Kitchen acyclovir (ZOVIRAX) 400 MG tablet Take 1 tablet (400 mg total) by mouth 2 (two) times daily. 60 tablet 5  . albuterol (VENTOLIN HFA) 108 (90 Base) MCG/ACT inhaler Inhale 2 puffs into the lungs every 6 (six) hours as needed for wheezing or shortness of breath. 8 g 3  . Ascorbic Acid (VITAMIN C) 1000 MG tablet Take 1,000 mg by mouth daily.     Marland Kitchen aspirin EC 81 MG tablet Take 81 mg by mouth daily.    . calcium carbonate (OS-CAL) 1250 (500 Ca) MG chewable tablet Chew 1 tablet by mouth daily.     Marland Kitchen  CARFILZOMIB IV Inject into the vein.    . cholecalciferol (VITAMIN D) 1000 units tablet Take 1,000 Units by mouth daily.    Marland Kitchen dexamethasone (DECADRON) 4 MG tablet Take 10 tablets (40 mg total) by mouth once a week. On the week you are off of treatment 10 tablet 3  . furosemide (LASIX) 20 MG tablet Take 1 tablet (20 mg total) by mouth daily as needed for edema (take as needed for swelling). One daily in place of hydrochlorothiazide 30 tablet 0  . gabapentin (NEURONTIN) 300 MG capsule Take 1  capsule (300 mg total) by mouth 3 (three) times daily. (Patient taking differently: Take 300 mg by mouth 2 (two) times daily. ) 90 capsule 3  . ibuprofen (ADVIL) 400 MG tablet Take 1 tablet (400 mg total) by mouth every 8 (eight) hours as needed. 90 tablet 0  . lidocaine-prilocaine (EMLA) cream Apply a quarter sized glob 1 hour prior to treatment. 30 g 5  . magnesium oxide (MAG-OX) 400 (241.3 Mg) MG tablet TAKE 1 TABLET BY MOUTH TWICE DAILY 60 tablet 6  . Multiple Vitamin (TAB-A-VITE) TABS TAKE 1 TABLET BY MOUTH ONCE DAILY. (Patient taking differently: Take 1 tablet by mouth daily. ) 30 tablet 11  . pomalidomide (POMALYST) 4 MG capsule Take 1 capsule (4 mg total) by mouth daily. 21 capsule 0  . trolamine salicylate (ASPERCREME) 10 % cream Apply 1 application topically 2 (two) times daily.    . hydrochlorothiazide (HYDRODIURIL) 25 MG tablet TAKE 1 TABLET BY MOUTH ONCE DAILY. (Patient not taking: Reported on 07/31/2019) 30 tablet 0   No current facility-administered medications for this visit.   Facility-Administered Medications Ordered in Other Visits  Medication Dose Route Frequency Provider Last Rate Last Admin  . heparin lock flush 100 unit/mL  500 Units Intravenous Once Penland, Larene Beach K, MD      . sodium chloride 0.9 % injection 10 mL  10 mL Intravenous Once Penland, Kelby Fam, MD        Allergies as of 07/31/2019 - Review Complete 07/31/2019  Allergen Reaction Noted  . Diclofenac Swelling 09/23/2018    Family History  Problem Relation Age of Onset  . Cancer Mother   . Hypertension Mother   . Cancer Father   . Hypertension Father   . Cancer Maternal Grandmother   . Hypertension Sister   . Hypertension Brother   . Prostate cancer Brother   . Colon cancer Neg Hx   . Colon polyps Neg Hx     Social History   Socioeconomic History  . Marital status: Legally Separated    Spouse name: Not on file  . Number of children: Not on file  . Years of education: Not on file  . Highest  education level: Not on file  Occupational History  . Not on file  Tobacco Use  . Smoking status: Never Smoker  . Smokeless tobacco: Never Used  Vaping Use  . Vaping Use: Never used  Substance and Sexual Activity  . Alcohol use: No  . Drug use: No  . Sexual activity: Not on file    Comment: divorced- 2 daughters  Other Topics Concern  . Not on file  Social History Narrative  . Not on file   Social Determinants of Health   Financial Resource Strain:   . Difficulty of Paying Living Expenses:   Food Insecurity:   . Worried About Charity fundraiser in the Last Year:   . [place] in the Last  Year:   Transportation Needs:   . Film/video editor (Medical):   Marland Kitchen Lack of Transportation (Non-Medical):   Physical Activity:   . Days of Exercise per Week:   . Minutes of Exercise per Session:   Stress:   . Feeling of Stress :   Social Connections:   . Frequency of Communication with Friends and Family:   . Frequency of Social Gatherings with Friends and Family:   . Attends Religious Services:   . Active Member of Clubs or Organizations:   . Attends Archivist Meetings:   Marland Kitchen Marital Status:   Intimate Partner Violence:   . Fear of Current or Ex-Partner:   . Emotionally Abused:   Marland Kitchen Physically Abused:   . Sexually Abused:     Review of Systems: Gen: See HPI CV: See HPI Resp: See HPI GI: See HPI GU : Denies urinary burning, urinary frequency, urinary hesitancy MS: Chronic knee pain. Takes tylenol and ibuprofen.  Derm: Denies rash Psych: Denies depression or anxiety Heme: Denies bruising or bleeding  Physical Exam: BP (!) 153/92   Pulse 88   Temp (!) 97.1 F (36.2 C)   Ht _0  (1.575 m)   Wt (!) 367 lb 9.6 oz (166.7 kg)   BMI 67.23 kg/m  General:   Alert and oriented. Pleasant and cooperative. Well-nourished and well-developed.  Morbidly obese. Head:  Normocephalic and atraumatic. Eyes:  Without icterus, sclera clear and conjunctiva pink.  Ears:   Normal auditory acuity. Lungs:  Clear to auscultation bilaterally. No wheezes, rales, or rhonchi. No distress.  Per clinical staff, patient is significantly out of breath walking from waiting room to exam room. Heart:  S1, S2 present without murmurs appreciated.  Abdomen:  Obese, +BS, soft, non-tender and non-distended. No HSM noted. No guarding or rebound. No masses appreciated.  Rectal:  Deferred  Msk:  Symmetrical without gross deformities. Normal posture. Extremities: 2-3+ bilateral lower extremity pitting edema. Neurologic:  Alert and  oriented x4;  grossly normal neurologically. Skin:  Intact without significant lesions or rashes. Psych:  Normal mood and affect.

## 2019-07-30 NOTE — Patient Instructions (Signed)
[facility] Discharge Instructions for Patients Receiving Chemotherapy  Today you received the following chemotherapy agents   To help prevent nausea and vomiting after your treatment, we encourage you to take your nausea medication   If you develop nausea and vomiting that is not controlled by your nausea medication, call the clinic.   BELOW ARE SYMPTOMS THAT SHOULD BE REPORTED IMMEDIATELY:  *FEVER GREATER THAN 100.5 F  *CHILLS WITH OR WITHOUT FEVER  NAUSEA AND VOMITING THAT IS NOT CONTROLLED WITH YOUR NAUSEA MEDICATION  *UNUSUAL SHORTNESS OF BREATH  *UNUSUAL BRUISING OR BLEEDING  TENDERNESS IN MOUTH AND THROAT WITH OR WITHOUT PRESENCE OF ULCERS  *URINARY PROBLEMS  *BOWEL PROBLEMS  UNUSUAL RASH Items with * indicate a potential emergency and should be followed up as soon as possible.  Feel free to call the clinic should you have any questions or concerns. The clinic phone number is [phone].  Please show the CHEMO ALERT CARD at check-in to the Emergency Department and triage nurse.   

## 2019-07-31 ENCOUNTER — Ambulatory Visit (INDEPENDENT_AMBULATORY_CARE_PROVIDER_SITE_OTHER): Payer: Medicare Other | Admitting: Gastroenterology

## 2019-07-31 ENCOUNTER — Encounter: Payer: Self-pay | Admitting: Gastroenterology

## 2019-07-31 DIAGNOSIS — Z1211 Encounter for screening for malignant neoplasm of colon: Secondary | ICD-10-CM | POA: Diagnosis not present

## 2019-07-31 DIAGNOSIS — Z01810 Encounter for preprocedural cardiovascular examination: Secondary | ICD-10-CM | POA: Insufficient documentation

## 2019-07-31 NOTE — Patient Instructions (Addendum)
You are due for a colonoscopy. I would like for you to have your follow-up with pulmonology first. We will get their input/clearance to proceed considering your significant shortness of breath.   Our staff will reach out to your pulmonologist. We will reach out to you once we hear from them.  Aliene Altes, PA-C [facility]

## 2019-07-31 NOTE — Assessment & Plan Note (Addendum)
64 year old female with history of HTN, claustrophobia, arthritis, anemia, recurrent multiple myeloma currently undergoing chemotherapy presenting today to discuss scheduling colonoscopy.  Last colonoscopy in 2011 with normal colon, small internal hemorrhoids, recommended repeat in 10 years.  No significant upper or lower GI symptoms at this time.  No family history of colon cancer.  Notably, she has had significantly worsening shortness of breath since starting chemotherapy of unknown etiology.  She is currently following with pulmonology with plans for sleep study tonight.  She has follow-up with pulmonology on 7/27.  Patient does need repeat screening colonoscopy.  However, due to significant shortness of breath, I have recommended she complete her sleep study, follow-up with pulmonology, and we will request clearance from pulmonology prior to scheduling. Further recommendations to follow.

## 2019-08-05 ENCOUNTER — Telehealth: Payer: Self-pay | Admitting: Emergency Medicine

## 2019-08-05 NOTE — Telephone Encounter (Signed)
Clearance sent  To tammy parrett on 07/31/19 for clearance. Waiting on response

## 2019-08-06 ENCOUNTER — Ambulatory Visit (HOSPITAL_COMMUNITY)
Admission: RE | Admit: 2019-08-06 | Discharge: 2019-08-06 | Disposition: A | Discharge status: Home / Self Care | Payer: Medicare Other | Source: Ambulatory Visit | Attending: Nurse Practitioner | Admitting: Nurse Practitioner

## 2019-08-06 ENCOUNTER — Inpatient Hospital Stay (HOSPITAL_COMMUNITY): Payer: Medicare Other

## 2019-08-06 ENCOUNTER — Other Ambulatory Visit (HOSPITAL_COMMUNITY): Payer: Self-pay | Admitting: Nurse Practitioner

## 2019-08-06 ENCOUNTER — Encounter (HOSPITAL_COMMUNITY): Payer: Self-pay | Admitting: Emergency Medicine

## 2019-08-06 ENCOUNTER — Other Ambulatory Visit: Payer: Self-pay

## 2019-08-06 VITALS — BP 147/75

## 2019-08-06 DIAGNOSIS — C9000 Multiple myeloma not having achieved remission: Secondary | ICD-10-CM

## 2019-08-06 DIAGNOSIS — R059 Cough, unspecified: Secondary | ICD-10-CM

## 2019-08-06 DIAGNOSIS — R05 Cough: Secondary | ICD-10-CM | POA: Diagnosis present

## 2019-08-06 DIAGNOSIS — I159 Secondary hypertension, unspecified: Secondary | ICD-10-CM

## 2019-08-06 DIAGNOSIS — Z5112 Encounter for antineoplastic immunotherapy: Secondary | ICD-10-CM | POA: Diagnosis not present

## 2019-08-06 LAB — COMPREHENSIVE METABOLIC PANEL
ALT: 36 U/L (ref 0–44)
AST: 22 U/L (ref 15–41)
Albumin: 3.7 g/dL (ref 3.5–5.0)
Alkaline Phosphatase: 52 U/L (ref 38–126)
Anion gap: 9 (ref 5–15)
BUN: 23 mg/dL (ref 8–23)
CO2: 25 mmol/L (ref 22–32)
Calcium: 9.2 mg/dL (ref 8.9–10.3)
Chloride: 106 mmol/L (ref 98–111)
Creatinine, Ser: 1.02 mg/dL — ABNORMAL HIGH (ref 0.44–1.00)
GFR calc Af Amer: >60 mL/min (ref >60)
GFR calc non Af Amer: 59 mL/min — ABNORMAL LOW (ref >60)
Glucose, Bld: 112 mg/dL — ABNORMAL HIGH (ref 70–99)
Potassium: 3.6 mmol/L (ref 3.5–5.1)
Sodium: 140 mmol/L (ref 135–145)
Total Bilirubin: 0.7 mg/dL (ref 0.3–1.2)
Total Protein: 6.2 g/dL — ABNORMAL LOW (ref 6.5–8.1)

## 2019-08-06 LAB — CBC WITH DIFFERENTIAL/PLATELET
Abs Immature Granulocytes: 0.12 10*3/uL — ABNORMAL HIGH (ref 0.00–0.07)
Basophils Absolute: 0 10*3/uL (ref 0.0–0.1)
Basophils Relative: 1 %
Eosinophils Absolute: 0.2 10*3/uL (ref 0.0–0.5)
Eosinophils Relative: 4 %
HCT: 31.2 % — ABNORMAL LOW (ref 36.0–46.0)
Hemoglobin: 9.9 g/dL — ABNORMAL LOW (ref 12.0–15.0)
Immature Granulocytes: 3 %
Lymphocytes Relative: 16 %
Lymphs Abs: 0.7 10*3/uL (ref 0.7–4.0)
MCH: 31.2 pg (ref 26.0–34.0)
MCHC: 31.7 g/dL (ref 30.0–36.0)
MCV: 98.4 fL (ref 80.0–100.0)
Monocytes Absolute: 0.5 10*3/uL (ref 0.1–1.0)
Monocytes Relative: 10 %
Neutro Abs: 2.9 10*3/uL (ref 1.7–7.7)
Neutrophils Relative %: 66 %
Platelets: 144 10*3/uL — ABNORMAL LOW (ref 150–400)
RBC: 3.17 MIL/uL — ABNORMAL LOW (ref 3.87–5.11)
RDW: 16.4 % — ABNORMAL HIGH (ref 11.5–15.5)
WBC: 4.4 10*3/uL (ref 4.0–10.5)
nRBC: 0 % (ref 0.0–0.2)

## 2019-08-06 MED ORDER — DEXTROSE 5 % IV SOLN
54.5000 mg/m2 | Freq: Once | INTRAVENOUS | Status: AC
  Administered 2019-08-06: 120 mg via INTRAVENOUS
  Filled 2019-08-06: qty 60

## 2019-08-06 MED ORDER — SODIUM CHLORIDE 0.9% FLUSH: 10.0000 mL | INTRAVENOUS | Status: DC | PRN

## 2019-08-06 MED ORDER — SODIUM CHLORIDE 0.9 % IV SOLN: Freq: Once | INTRAVENOUS | Status: AC

## 2019-08-06 MED ORDER — SODIUM CHLORIDE 0.9% FLUSH
10.0000 mL | INTRAVENOUS | Status: DC | PRN
  Administered 2019-08-06: 10 mL via INTRAVENOUS

## 2019-08-06 MED ORDER — SODIUM CHLORIDE 0.9 % IV SOLN
40.0000 mg | Freq: Once | INTRAVENOUS | Status: AC
  Administered 2019-08-06: 40 mg via INTRAVENOUS
  Filled 2019-08-06: qty 4

## 2019-08-06 MED ORDER — CLONIDINE HCL 0.1 MG PO TABS: 0.2000 mg | ORAL_TABLET | Freq: Once | ORAL | Status: DC

## 2019-08-06 MED ORDER — HEPARIN SOD (PORK) LOCK FLUSH 100 UNIT/ML IV SOLN
500.0000 [IU] | Freq: Once | INTRAVENOUS | Status: AC | PRN
  Administered 2019-08-06: 500 [IU]

## 2019-08-06 NOTE — Patient Instructions (Signed)
[facility] Discharge Instructions for Patients Receiving Chemotherapy  Today you received the following chemotherapy agents   To help prevent nausea and vomiting after your treatment, we encourage you to take your nausea medication   If you develop nausea and vomiting that is not controlled by your nausea medication, call the clinic.   BELOW ARE SYMPTOMS THAT SHOULD BE REPORTED IMMEDIATELY:  *FEVER GREATER THAN 100.5 F  *CHILLS WITH OR WITHOUT FEVER  NAUSEA AND VOMITING THAT IS NOT CONTROLLED WITH YOUR NAUSEA MEDICATION  *UNUSUAL SHORTNESS OF BREATH  *UNUSUAL BRUISING OR BLEEDING  TENDERNESS IN MOUTH AND THROAT WITH OR WITHOUT PRESENCE OF ULCERS  *URINARY PROBLEMS  *BOWEL PROBLEMS  UNUSUAL RASH Items with * indicate a potential emergency and should be followed up as soon as possible.  Feel free to call the clinic should you have any questions or concerns. The clinic phone number is [phone].  Please show the CHEMO ALERT CARD at check-in to the Emergency Department and triage nurse.   

## 2019-08-06 NOTE — Progress Notes (Signed)
Rechecked blood pressure, will hold bp medication per NP

## 2019-08-06 NOTE — Progress Notes (Signed)
Pt here today for tx. Pt c/o of sob w/ exertion. +3 pitting edema in Bilateral LE. Pt also has a mild productive cough x3days.  bp elevated 178/87. 8lb weight gain also noted in 6 days. Francene Finders, NP made aware. Will follow up.

## 2019-08-06 NOTE — Progress Notes (Signed)
Labs reviewed today. Labs meet parameters for treatment. Proceed as planned.   Treatment given per orders. Patient tolerated it well without problems. Vitals stable and discharged home from clinic via wheelchair. Follow up as scheduled.

## 2019-08-06 NOTE — Progress Notes (Signed)
Per Francene Finders, NP pt is to increase lasix to 20mg  BID x4days and we will also send pt for chest xray today. Pt verbalized understanding of this.

## 2019-08-08 ENCOUNTER — Other Ambulatory Visit (HOSPITAL_COMMUNITY): Payer: Self-pay | Admitting: Nurse Practitioner

## 2019-08-08 ENCOUNTER — Other Ambulatory Visit (HOSPITAL_COMMUNITY): Payer: Self-pay | Admitting: *Deleted

## 2019-08-08 MED ORDER — POMALIDOMIDE 4 MG PO CAPS: 4.0000 mg | ORAL_CAPSULE | Freq: Every day | ORAL | 0 refills | Status: DC

## 2019-08-12 ENCOUNTER — Encounter (HOSPITAL_COMMUNITY): Payer: Self-pay | Admitting: *Deleted

## 2019-08-12 ENCOUNTER — Other Ambulatory Visit (HOSPITAL_COMMUNITY): Payer: Self-pay | Admitting: *Deleted

## 2019-08-12 MED ORDER — BUMETANIDE 1 MG PO TABS: 1.0000 mg | ORAL_TABLET | Freq: Every day | ORAL | 1 refills | Status: DC

## 2019-08-12 NOTE — Progress Notes (Signed)
Patient's daughter called clinic today stating that patient was having increased effort in breathing, extremely SOB with exertion and increased fluid pills did not help.    I spoke with Dr. Delton Coombes and he wants patient to stop taking Lasix and start taking Bumex 1 mg daily.    I called back and spoke with daughter and advised of new orders. She verbalizes understanding and will call us back to update on effects.

## 2019-08-18 ENCOUNTER — Encounter (HOSPITAL_COMMUNITY): Payer: Self-pay | Admitting: *Deleted

## 2019-08-18 NOTE — Progress Notes (Signed)
Patient called clinic reporting no change in the swelling in her feet/legs.  She has been taking the Bumex.  Per Dr. Delton Coombes, he wants patient to increase her Bumex to 2 mg daily.  He also wants her to stop the dexamethasone.   I have called patient back and given her the new directions. She verbalizes understanding.  She will follow up Wednesday as scheduled.

## 2019-08-19 ENCOUNTER — Telehealth: Payer: Self-pay | Admitting: Pulmonary Disease

## 2019-08-19 DIAGNOSIS — R0902 Hypoxemia: Secondary | ICD-10-CM

## 2019-08-19 NOTE — Telephone Encounter (Signed)
Patient notified of results, DME order placed for 2 liters oxygen at night, repeat ONO order placed.

## 2019-08-19 NOTE — Telephone Encounter (Signed)
08/19/2019  We have received results of patient's overnight oximetry test on room air on 08/04/19  This is from the [facility]  It appears per this documentation that patient slept for 8 hours and 43 minutes, testing showing time spent below 88% 4 hours and 6 minutes, lowest oxygen level appears to be 68% (difficulty fully read scanned in information), average oxygen levels 88.36%.  Based off this information patient would qualify for 2 L of O2 at night.  Please place the order.  Please also place an order for a repeat overnight oximetry test to be completed on 2 L of O2 sometime over the next 2 to 4 weeks to ensure that this is adequate amount of oxygen for the patient to be utilizing in order to maintain oxygen saturations above 88%.  We will route to triage for follow-up with patient. Will route to TP NP as she is the original person who ordered this test.  Wyn Quaker, FNP

## 2019-08-20 ENCOUNTER — Inpatient Hospital Stay (HOSPITAL_BASED_OUTPATIENT_CLINIC_OR_DEPARTMENT_OTHER): Payer: Medicare Other | Admitting: Hematology

## 2019-08-20 ENCOUNTER — Other Ambulatory Visit (HOSPITAL_COMMUNITY): Payer: Self-pay | Admitting: *Deleted

## 2019-08-20 ENCOUNTER — Inpatient Hospital Stay (HOSPITAL_COMMUNITY): Payer: Medicare Other

## 2019-08-20 ENCOUNTER — Inpatient Hospital Stay (HOSPITAL_COMMUNITY): Payer: Medicare Other | Attending: Hematology

## 2019-08-20 ENCOUNTER — Other Ambulatory Visit: Payer: Self-pay

## 2019-08-20 DIAGNOSIS — G479 Sleep disorder, unspecified: Secondary | ICD-10-CM | POA: Diagnosis not present

## 2019-08-20 DIAGNOSIS — R519 Headache, unspecified: Secondary | ICD-10-CM | POA: Diagnosis not present

## 2019-08-20 DIAGNOSIS — Z809 Family history of malignant neoplasm, unspecified: Secondary | ICD-10-CM | POA: Diagnosis not present

## 2019-08-20 DIAGNOSIS — Z8042 Family history of malignant neoplasm of prostate: Secondary | ICD-10-CM | POA: Diagnosis not present

## 2019-08-20 DIAGNOSIS — R0602 Shortness of breath: Secondary | ICD-10-CM | POA: Insufficient documentation

## 2019-08-20 DIAGNOSIS — Z8719 Personal history of other diseases of the digestive system: Secondary | ICD-10-CM | POA: Insufficient documentation

## 2019-08-20 DIAGNOSIS — R609 Edema, unspecified: Secondary | ICD-10-CM | POA: Diagnosis not present

## 2019-08-20 DIAGNOSIS — Z8249 Family history of ischemic heart disease and other diseases of the circulatory system: Secondary | ICD-10-CM | POA: Diagnosis not present

## 2019-08-20 DIAGNOSIS — Z79899 Other long term (current) drug therapy: Secondary | ICD-10-CM | POA: Diagnosis not present

## 2019-08-20 DIAGNOSIS — R5383 Other fatigue: Secondary | ICD-10-CM | POA: Diagnosis not present

## 2019-08-20 DIAGNOSIS — R918 Other nonspecific abnormal finding of lung field: Secondary | ICD-10-CM | POA: Insufficient documentation

## 2019-08-20 DIAGNOSIS — M1990 Unspecified osteoarthritis, unspecified site: Secondary | ICD-10-CM | POA: Insufficient documentation

## 2019-08-20 DIAGNOSIS — C9000 Multiple myeloma not having achieved remission: Secondary | ICD-10-CM

## 2019-08-20 DIAGNOSIS — R0601 Orthopnea: Secondary | ICD-10-CM | POA: Insufficient documentation

## 2019-08-20 DIAGNOSIS — Z7952 Long term (current) use of systemic steroids: Secondary | ICD-10-CM | POA: Insufficient documentation

## 2019-08-20 DIAGNOSIS — M2550 Pain in unspecified joint: Secondary | ICD-10-CM | POA: Insufficient documentation

## 2019-08-20 DIAGNOSIS — I119 Hypertensive heart disease without heart failure: Secondary | ICD-10-CM | POA: Diagnosis not present

## 2019-08-20 DIAGNOSIS — R05 Cough: Secondary | ICD-10-CM | POA: Diagnosis not present

## 2019-08-20 DIAGNOSIS — Z5112 Encounter for antineoplastic immunotherapy: Secondary | ICD-10-CM | POA: Diagnosis present

## 2019-08-20 DIAGNOSIS — Z9221 Personal history of antineoplastic chemotherapy: Secondary | ICD-10-CM | POA: Diagnosis not present

## 2019-08-20 DIAGNOSIS — G629 Polyneuropathy, unspecified: Secondary | ICD-10-CM | POA: Insufficient documentation

## 2019-08-20 LAB — CBC WITH DIFFERENTIAL/PLATELET
Abs Immature Granulocytes: 0.04 10*3/uL (ref 0.00–0.07)
Basophils Absolute: 0 10*3/uL (ref 0.0–0.1)
Basophils Relative: 2 %
Eosinophils Absolute: 0.1 10*3/uL (ref 0.0–0.5)
Eosinophils Relative: 3 %
HCT: 34.8 % — ABNORMAL LOW (ref 36.0–46.0)
Hemoglobin: 11 g/dL — ABNORMAL LOW (ref 12.0–15.0)
Immature Granulocytes: 2 %
Lymphocytes Relative: 23 %
Lymphs Abs: 0.6 10*3/uL — ABNORMAL LOW (ref 0.7–4.0)
MCH: 31.3 pg (ref 26.0–34.0)
MCHC: 31.6 g/dL (ref 30.0–36.0)
MCV: 99.1 fL (ref 80.0–100.0)
Monocytes Absolute: 0.4 10*3/uL (ref 0.1–1.0)
Monocytes Relative: 13 %
Neutro Abs: 1.6 10*3/uL — ABNORMAL LOW (ref 1.7–7.7)
Neutrophils Relative %: 57 %
Platelets: 189 10*3/uL (ref 150–400)
RBC: 3.51 MIL/uL — ABNORMAL LOW (ref 3.87–5.11)
RDW: 16.3 % — ABNORMAL HIGH (ref 11.5–15.5)
WBC: 2.7 10*3/uL — ABNORMAL LOW (ref 4.0–10.5)
nRBC: 0 % (ref 0.0–0.2)

## 2019-08-20 LAB — COMPREHENSIVE METABOLIC PANEL
ALT: 19 U/L (ref 0–44)
AST: 16 U/L (ref 15–41)
Albumin: 3.9 g/dL (ref 3.5–5.0)
Alkaline Phosphatase: 44 U/L (ref 38–126)
Anion gap: 9 (ref 5–15)
BUN: 16 mg/dL (ref 8–23)
CO2: 30 mmol/L (ref 22–32)
Calcium: 9.6 mg/dL (ref 8.9–10.3)
Chloride: 102 mmol/L (ref 98–111)
Creatinine, Ser: 1 mg/dL (ref 0.44–1.00)
GFR calc Af Amer: >60 mL/min (ref >60)
GFR calc non Af Amer: >60 mL/min (ref >60)
Glucose, Bld: 112 mg/dL — ABNORMAL HIGH (ref 70–99)
Potassium: 3.3 mmol/L — ABNORMAL LOW (ref 3.5–5.1)
Sodium: 141 mmol/L (ref 135–145)
Total Bilirubin: 0.9 mg/dL (ref 0.3–1.2)
Total Protein: 6.2 g/dL — ABNORMAL LOW (ref 6.5–8.1)

## 2019-08-20 LAB — BRAIN NATRIURETIC PEPTIDE: B Natriuretic Peptide: 75 pg/mL (ref 0.0–100.0)

## 2019-08-20 MED ORDER — HEPARIN SOD (PORK) LOCK FLUSH 100 UNIT/ML IV SOLN
500.0000 [IU] | Freq: Once | INTRAVENOUS | Status: AC
  Administered 2019-08-20: 500 [IU] via INTRAVENOUS

## 2019-08-20 MED ORDER — METOLAZONE 2.5 MG PO TABS: 2.5000 mg | ORAL_TABLET | Freq: Every day | ORAL | 0 refills | Status: DC

## 2019-08-20 MED ORDER — SODIUM CHLORIDE 0.9% FLUSH
10.0000 mL | Freq: Once | INTRAVENOUS | Status: AC
  Administered 2019-08-20: 10 mL via INTRAVENOUS

## 2019-08-20 MED ORDER — GABAPENTIN 300 MG PO CAPS: 300.0000 mg | ORAL_CAPSULE | Freq: Two times a day (BID) | ORAL | 2 refills | Status: DC

## 2019-08-20 NOTE — Patient Instructions (Addendum)
[facility] Discharge Instructions  You were seen today by Dr. Delton Coombes. He went over your recent results. You did not receive your treatment today due to your swelling; do not start taking the pomalidomide tonight. You will be prescribed a fluid pill to decrease your swelling. You will be scheduled for an echocardiogram. Dr. Delton Coombes will see you back in 1 week for labs and follow up. No treatment at that time.   If your BP is less than 90 on the top number do not take the zaroxyln/metozalone.      Thank you for choosing [employer] at [facility] to provide your oncology and hematology care.  To afford each patient quality time with our provider, please arrive at least 15 minutes before your scheduled appointment time.   If you have a lab appointment with the [facility] please come in thru the Main Entrance and check in at the main information desk  You need to re-schedule your appointment should you arrive 10 or more minutes late.  We strive to give you quality time with our providers, and arriving late affects you and other patients whose appointments are after yours.  Also, if you no show three or more times for appointments you may be dismissed from the clinic at the providers discretion.     Again, thank you for choosing [facility].  Our hope is that these requests will decrease the amount of time that you wait before being seen by our physicians.       _____________________________________________________________  Should you have questions after your visit to [facility], please contact our office at [phone] between the hours of 8:00 a.m. and 4:30 p.m.  Voicemails left after 4:00 p.m. will not be returned until the following business day.  For prescription refill requests, have your pharmacy contact our office and allow 72 hours.    Cancer Center Support Programs:   > Cancer Support Group   2nd Tuesday of the month 1pm-2pm, Journey Room

## 2019-08-20 NOTE — Progress Notes (Signed)
Pt complaining of bilateral leg swelling as well as feeling like whole body has extra fluid.   Treatment held per Dr Raliegh Ip. Will reschedule for possible treatment in 2 weeks.    Pt discharged via wheelchair.

## 2019-08-20 NOTE — Patient Instructions (Signed)
[facility] at [facility]  Discharge Instructions:   _______________________________________________________________  Thank you for choosing [facility] at [facility] to provide your oncology and hematology care.  To afford each patient quality time with our providers, please arrive at least 15 minutes before your scheduled appointment.  You need to re-schedule your appointment if you arrive 10 or more minutes late.  We strive to give you quality time with our providers, and arriving late affects you and other patients whose appointments are after yours.  Also, if you no show three or more times for appointments you may be dismissed from the clinic.  Again, thank you for choosing [facility] at [facility]. Our hope is that these requests will allow you access to exceptional care and in a timely manner. _______________________________________________________________  If you have questions after your visit, please contact our office at [phone] between the hours of 8:30 a.m. and 5:00 p.m. Voicemails left after 4:30 p.m. will not be returned until the following business day. _______________________________________________________________  For prescription refill requests, have your pharmacy contact our office. _______________________________________________________________  Recommendations made by the consultant and any test results will be sent to your referring physician. _______________________________________________________________ 

## 2019-08-20 NOTE — Progress Notes (Signed)
Kelli Hurley, [place] [address]   CLINIC:  Medical Oncology/Hematology  PCP:  Kelli Flavors, MD [address] [place] [address] [phone]   REASON FOR VISIT:  Follow-up for multiple myeloma  PRIOR THERAPY: None  CURRENT THERAPY: Carfilzomib, pomalidomide and dexamethasone  BRIEF ONCOLOGIC HISTORY:  Oncology History  Multiple myeloma ([place])  08/07/2014 Imaging   Bone Survey- No lytic lesions are noted in the visualized skeleton.   09/03/2014 Bone Marrow Biopsy   NORMOCELLULAR BONE MARROW FOR AGE WITH PLASMA CELL NEOPLASM.  The plasma cell component is increased in the marrow representing an estimated 18% of all cells. Cytogenetics with 13q-, 17p- (high risk disease)   09/03/2014 Pathology Results   Cytogenetics with 13q-, 17p- (high risk disease)   09/23/2014 Initial Diagnosis   Multiple myeloma   09/30/2014 PET scan   No abnormal hypermetabolism in the neck, chest, abdomen or pelvis.   10/05/2014 - 03/22/2015 Chemotherapy   RVD   10/28/2014 Treatment Plan Change   Issues related to getting Revlimid in a timely fashion, therefore, she received her Revlimid on 9/21 resulting in a 12 day cycle this time instead of a 14 day cycle   11/02/2014 Imaging   CTA chest- No evidence for a large or central pulmonary embolism as described.  8 mm density along the right minor fissure could represent focal pleural thickening but indeterminate. If the patient is at high risk for bronchogenic carcinoma, follow-up c   11/23/2014 Miscellaneous   Zometa 4 mg IV monthly   04/07/2015 Bone Marrow Biopsy   Normocellular marrow with 2-4% clonal plasma cells by immunohistochemistry. FISH and cytogenetics were normal [facility])     04/2015 Miscellaneous   PRETRANSPLANT EVALUATION:  Pulmonary function tests: FEV1 100.3% / DLCO 97.9%  Echocardiogram: Normal LV function with EF 60-65%    04/27/2015 Procedure   Stem cell mobilization with filgrastim and  [facility])   05/06/2015 Miscellaneous   BMT conditioning regimen with high-dose Melphalan given [name]); Day -1   05/07/2015 Bone Marrow Transplant   Outpatient autologous stem cell transplant [name]); Day 0   05/18/2015 Miscellaneous   WBC engraftment;  did not require platelet transfusion during her transplant process. Lowest platelet count 28,000    05/20/2015 Procedure   Tunneled catheter removed [facility])   09/08/2015 -  Chemotherapy   Velcade every 2 weeks   12/15/2015 Miscellaneous   Zometa re-instituted.    12/23/2015 Imaging   Bone density- BMD as determined from Forearm Radius 33% is 0.799 g/cm2 with a T-Score of 1.2. This patient is considered normal according to [facility]) criteria.   04/17/2016 Miscellaneous   Started maintenance with Ninlaro.   05/04/2016 Treatment Plan Change   Zometa switched to Xgeva injection monthly given difficult IV access.    03/05/2019 -  Chemotherapy   The patient had carfilzomib (KYPROLIS) 40 mg in dextrose 5 % 50 mL chemo infusion, 18 mg/m2 = 44 mg, Intravenous,  Once, 6 of 8 cycles Dose modification: 56 mg/m2 (original dose 56 mg/m2, Cycle 1, Reason: Provider Judgment) Administration: 40 mg (03/05/2019), 120 mg (03/12/2019), 120 mg (03/20/2019), 120 mg (04/02/2019), 120 mg (04/09/2019), 120 mg (04/16/2019), 120 mg (04/30/2019), 120 mg (05/08/2019), 120 mg (05/14/2019), 120 mg (05/28/2019), 120 mg (06/04/2019), 120 mg (06/11/2019), 120 mg (06/25/2019), 120 mg (07/02/2019), 120 mg (07/09/2019), 120 mg (07/23/2019), 120 mg (07/30/2019), 120 mg (08/06/2019)  for chemotherapy treatment.  CANCER STAGING: Cancer Staging Multiple myeloma [facility]) Staging form: Multiple Myeloma, AJCC 6th Edition - Clinical stage from 10/26/2014: Stage IIA - Signed by Baird Cancer, PA-C on 10/26/2014 - Pathologic: No stage assigned - Unsigned   INTERVAL HISTORY:  Kelli Hurley, a 64 y.o. female, returns for  routine follow-up and consideration for next cycle of chemotherapy. [name] was last seen on 07/23/2019.  Due for cycle #7 of carfilzomib today.   Today she reports that she took her Decadron and her feet and legs have been severely swollen since the last visit, so much that she cannot put on shoes. She has been on Bumex for 1 week now. Her headache is painful and concentrated behind the right eye, though she does not visual disturbances, and then the pain changes sides to the left side. She gets orthopnea and has to sleep upright. She will start the Pomalyst tonight. Her energy levels have been extremely low for the past month.   REVIEW OF SYSTEMS:  Review of Systems  Constitutional: Positive for fatigue (depleted). Negative for appetite change.  Eyes: Negative for eye problems.  Respiratory: Positive for cough and shortness of breath (severe).   Cardiovascular: Positive for leg swelling (leg & feet swelling, severe).  Musculoskeletal: Positive for arthralgias (7/10 pain in knees, legs & feet).  Neurological: Positive for headaches (pain alternating behind left and right eye).  Psychiatric/Behavioral: Positive for sleep disturbance.  All other systems reviewed and are negative.   PAST MEDICAL/SURGICAL HISTORY:  Past Medical History:  Diagnosis Date  . Anemia   . Arthritis   . Cancer ([place])   . Claustrophobia 10/05/2014  . Hypertension   . Leukopenia 08/03/2014  . Normocytic hypochromic anemia 08/03/2014  . Renal disorder    stage 3    Past Surgical History:  Procedure Laterality Date  . ABDOMINAL HYSTERECTOMY    . CARDIAC SURGERY     76 months old. States she had a leaky valve.   . COLONOSCOPY  03/26/2009   Dr. Oneida Alar; normal colon, small internal hemorrhoids.  Recommended repeat colonoscopy in 10 years.  . OTHER SURGICAL HISTORY     heart surgery as infant to "repair hole in heart"  . PORTACATH PLACEMENT Left 02/21/2019   Procedure: INSERTION PORT-A-CATH;  Surgeon: Virl Cagey, MD;  Location: [facility];  Service: General;  Laterality: Left;    SOCIAL HISTORY:  Social History   Socioeconomic History  . Marital status: Legally Separated    Spouse name: Not on file  . Number of children: Not on file  . Years of education: Not on file  . Highest education level: Not on file  Occupational History  . Not on file  Tobacco Use  . Smoking status: Never Smoker  . Smokeless tobacco: Never Used  Vaping Use  . Vaping Use: Never used  Substance and Sexual Activity  . Alcohol use: No  . Drug use: No  . Sexual activity: Not on file    Comment: divorced- 2 daughters  Other Topics Concern  . Not on file  Social History Narrative  . Not on file   Social Determinants of Health   Financial Resource Strain:   . Difficulty of Paying Living Expenses:   Food Insecurity:   . Worried About Charity fundraiser in the Last Year:   . Arboriculturist in the Last Year:   Transportation Needs:   . Film/video editor (Medical):   Marland Kitchen Lack of Transportation (Non-Medical):  Physical Activity:   . Days of Exercise per Week:   . Minutes of Exercise per Session:   Stress:   . Feeling of Stress :   Social Connections:   . Frequency of Communication with Friends and Family:   . Frequency of Social Gatherings with Friends and Family:   . Attends Religious Services:   . Active Member of Clubs or Organizations:   . Attends Archivist Meetings:   Marland Kitchen Marital Status:   Intimate Partner Violence:   . Fear of Current or Ex-Partner:   . Emotionally Abused:   Marland Kitchen Physically Abused:   . Sexually Abused:     FAMILY HISTORY:  Family History  Problem Relation Age of Onset  . Cancer Mother   . Hypertension Mother   . Cancer Father   . Hypertension Father   . Cancer Maternal Grandmother   . Hypertension Sister   . Hypertension Brother   . Prostate cancer Brother   . Colon cancer Neg Hx   . Colon polyps Neg Hx     CURRENT MEDICATIONS:  Current Outpatient  Medications  Medication Sig Dispense Refill  . acyclovir (ZOVIRAX) 400 MG tablet Take 1 tablet (400 mg total) by mouth 2 (two) times daily. 60 tablet 5  . Ascorbic Acid (VITAMIN C) 1000 MG tablet Take 1,000 mg by mouth daily.     Marland Kitchen aspirin EC 81 MG tablet Take 81 mg by mouth daily.    . bumetanide (BUMEX) 1 MG tablet Take 1 tablet (1 mg total) by mouth daily. (Patient taking differently: Take 1 mg by mouth 2 (two) times daily. ) 30 tablet 1  . calcium carbonate (OS-CAL) 1250 (500 Ca) MG chewable tablet Chew 1 tablet by mouth daily.     Marland Kitchen CARFILZOMIB IV Inject into the vein.    . cholecalciferol (VITAMIN D) 1000 units tablet Take 1,000 Units by mouth daily.    Marland Kitchen gabapentin (NEURONTIN) 300 MG capsule Take 1 capsule (300 mg total) by mouth 3 (three) times daily. (Patient taking differently: Take 300 mg by mouth 2 (two) times daily. ) 90 capsule 3  . hydrochlorothiazide (HYDRODIURIL) 25 MG tablet TAKE 1 TABLET BY MOUTH ONCE DAILY. 30 tablet 0  . lidocaine-prilocaine (EMLA) cream Apply a quarter sized glob 1 hour prior to treatment. 30 g 5  . magnesium oxide (MAG-OX) 400 (241.3 Mg) MG tablet TAKE 1 TABLET BY MOUTH TWICE DAILY 60 tablet 6  . Multiple Vitamin (TAB-A-VITE) TABS TAKE 1 TABLET BY MOUTH ONCE DAILY. (Patient taking differently: Take 1 tablet by mouth daily. ) 30 tablet 11  . pomalidomide (POMALYST) 4 MG capsule Take 1 capsule (4 mg total) by mouth daily. 21 capsule 0  . trolamine salicylate (ASPERCREME) 10 % cream Apply 1 application topically 2 (two) times daily.    Marland Kitchen acetaminophen (TYLENOL 8 HOUR ARTHRITIS PAIN) 650 MG CR tablet Take 650 mg by mouth every 8 (eight) hours as needed for pain.  (Patient not taking: Reported on 08/20/2019)    . albuterol (VENTOLIN HFA) 108 (90 Base) MCG/ACT inhaler Inhale 2 puffs into the lungs every 6 (six) hours as needed for wheezing or shortness of breath. (Patient not taking: Reported on 08/20/2019) 8 g 3  . dexamethasone (DECADRON) 4 MG tablet Take 10  tablets (40 mg total) by mouth once a week. On the week you are off of treatment (Patient not taking: Reported on 08/20/2019) 10 tablet 3  . ibuprofen (ADVIL) 400 MG tablet Take 1 tablet (400 mg  total) by mouth every 8 (eight) hours as needed. (Patient not taking: Reported on 08/20/2019) 90 tablet 0  . metolazone (ZAROXOLYN) 2.5 MG tablet Take 1 tablet (2.5 mg total) by mouth daily. 30 tablet 0   No current facility-administered medications for this visit.   Facility-Administered Medications Ordered in Other Visits  Medication Dose Route Frequency Provider Last Rate Last Admin  . heparin lock flush 100 unit/mL  500 Units Intravenous Once Penland, Larene Beach K, MD      . sodium chloride 0.9 % injection 10 mL  10 mL Intravenous Once Penland, Kelby Fam, MD        ALLERGIES:  Allergies  Allergen Reactions  . Diclofenac Swelling    Per pt, facial swelling    PHYSICAL EXAM:  Performance status (ECOG): 1 - Symptomatic but completely ambulatory  There were no vitals filed for this visit. Wt Readings from Last 3 Encounters:  08/20/19 (!) 361 lb 12.8 oz (164.1 kg)  08/06/19 (!) 375 lb 6 oz (170.3 kg)  07/31/19 (!) 367 lb 9.6 oz (166.7 kg)   Physical Exam Vitals reviewed.  Constitutional:      Appearance: Normal appearance. She is obese.  Cardiovascular:     Rate and Rhythm: Normal rate and regular rhythm.     Pulses: Normal pulses.     Heart sounds: Normal heart sounds.  Pulmonary:     Effort: Pulmonary effort is normal.     Breath sounds: Normal breath sounds.  Chest:     Comments: Port on L chest Musculoskeletal:     Right lower leg: Edema (2+) present.     Left lower leg: Edema (2+) present.  Neurological:     General: No focal deficit present.     Mental Status: She is alert and oriented to person, place, and time.  Psychiatric:        Mood and Affect: Mood normal.        Behavior: Behavior normal.     LABORATORY DATA:  I have reviewed the labs as listed.  CBC Latest Ref  Rng & Units 08/20/2019 08/06/2019 07/30/2019  WBC 4.0 - 10.5 K/uL 2.7(L) 4.4 5.5  Hemoglobin 12.0 - 15.0 g/dL 11.0(L) 9.9(L) 10.0(L)  Hematocrit 36 - 46 % 34.8(L) 31.2(L) 31.7(L)  Platelets 150 - 400 K/uL 189 144(L) 152   CMP Latest Ref Rng & Units 08/20/2019 08/06/2019 07/30/2019  Glucose 70 - 99 mg/dL 112(H) 112(H) 124(H)  BUN 8 - 23 mg/dL 16 23 21   Creatinine 0.44 - 1.00 mg/dL 1.00 1.02(H) 1.07(H)  Sodium 135 - 145 mmol/L 141 140 140  Potassium 3.5 - 5.1 mmol/L 3.3(L) 3.6 3.6  Chloride 98 - 111 mmol/L 102 106 103  CO2 22 - 32 mmol/L 30 25 27   Calcium 8.9 - 10.3 mg/dL 9.6 9.2 8.9  Total Protein 6.5 - 8.1 g/dL 6.2(L) 6.2(L) 6.2(L)  Total Bilirubin 0.3 - 1.2 mg/dL 0.9 0.7 0.7  Alkaline Phos 38 - 126 U/L 44 52 42  AST 15 - 41 U/L 16 22 14(L)  ALT 0 - 44 U/L 19 36 15   Lab Results  Component Value Date   TOTALPROTELP 5.9 (L) 07/09/2019   ALBUMINELP 3.3 07/09/2019   A1GS 0.2 07/09/2019   A2GS 0.6 07/09/2019   BETS 0.9 07/09/2019   GAMS 0.8 07/09/2019   MSPIKE 0.3 (H) 07/09/2019   SPEI Comment 07/09/2019    Lab Results  Component Value Date   KPAFRELGTCHN 24.8 (H) 07/09/2019   LAMBDASER 18.1 07/09/2019   KAPLAMBRATIO  1.37 07/09/2019    DIAGNOSTIC IMAGING:  I have independently reviewed the scans and discussed with the patient. DG Chest 2 View  Result Date: 08/07/2019 CLINICAL DATA:  New cough, sputum production EXAM: CHEST - 2 VIEW COMPARISON:  06/27/2019 FINDINGS: Cardiomegaly. Unchanged diffuse interstitial opacity and pulmonary vascular prominence. Disc degenerative disease of the thoracic spine. Left chest port catheter. IMPRESSION: Cardiomegaly with unchanged diffuse interstitial opacity, likely edema. No new or focal airspace opacity. Electronically Signed   By: Eddie Candle M.D.   On: 08/07/2019 08:02     ASSESSMENT:  1. Relapsed IgG kappa multiple myeloma with high risk features: -5 cycles of carfilzomib, pomalidomide and dexamethasone from 03/05/2019 through  06/25/2019. -Myeloma panel on 07/09/2019 shows M spike 0.3 g, slightly up from 0.2 g previously.  However free light chain ratio has 1.37 and improved.  Kappa light chains are 24.8.  2.  Shortness of breath on exertion: -2D echo on 02/09/2019 shows EF 60-65%.  No LVH. -Chest CT PE protocol on 02/24/2019 was negative. -Cardiac MRI on 03/07/2019 shows LVEF 56% with no amyloid.  Troponin T and proBNP were negative. -PFTs show low expiratory reserve volume consistent with body habitus.  Moderate diffusion defect which corrects to normal.  FVC, FEV1, FEV1/FVC ratio are within normal limits.  FVC is reduced relative to SVC indicates air-trapping.  No significant response after bronchodilators.  Reduced diffusion capacity indicates moderate loss of functional alveolar capillary space. -She was evaluated by Dr. Melvyn Novas.  She has another follow-up with him soon.  3.  Myeloma bone disease: -Bone density on 07/04/2019 shows T score 1.8.  PLAN:  1. Relapsed IgG kappa multiple myeloma with high risk features: -We will hold her myeloma treatment today including dexamethasone, pomalidomide and carfilzomib due to fluid retention. -Chest x-ray on 08/06/2019 shows stable cardiomegaly and unchanged diffuse interstitial opacity likely edema. -We will reevaluate her in 1 week.  2. Shortness of breath on exertion: -This is on exertion.  She will continue bronchodilators.  She was evaluated by pulmonary.  3. ID prophylaxis: -Continue acyclovir twice daily.  Continue aspirin for thromboprophylaxis.  4. Neuropathy: -Continue gabapentin 300 mg 2-3 times a day.  5. Lower extremity edema: -This has worsened since last treatment.  We have changed her Lasix to Bumex 1 mg last week.  We have increased Bumex to 2 mg daily yesterday.  She is having diuresis.  However she still has lower extremity swelling. -I will add metolazone 2.5 mg daily as needed. -I have checked BNP which was 75. -I will order 2D  echocardiogram.  6. Myeloma bone disease: -Continue Zometa monthly.    Orders placed this encounter:  Orders Placed This Encounter  Procedures  . Brain natriuretic peptide  . ECHOCARDIOGRAM COMPLETE     Derek Jack, MD [place] [phone]   I, Milinda Antis, am acting as a scribe for Dr. Sanda Linger.  I, Derek Jack MD, have reviewed the above documentation for accuracy and completeness, and I agree with the above.

## 2019-08-21 ENCOUNTER — Other Ambulatory Visit (HOSPITAL_COMMUNITY): Payer: Medicare Other

## 2019-08-27 ENCOUNTER — Inpatient Hospital Stay (HOSPITAL_BASED_OUTPATIENT_CLINIC_OR_DEPARTMENT_OTHER): Payer: Medicare Other | Admitting: Hematology

## 2019-08-27 ENCOUNTER — Inpatient Hospital Stay (HOSPITAL_COMMUNITY): Payer: Medicare Other

## 2019-08-27 ENCOUNTER — Other Ambulatory Visit (HOSPITAL_COMMUNITY): Payer: Medicare Other

## 2019-08-27 ENCOUNTER — Ambulatory Visit (HOSPITAL_COMMUNITY): Payer: Medicare Other

## 2019-08-27 ENCOUNTER — Ambulatory Visit (HOSPITAL_COMMUNITY)
Admission: RE | Admit: 2019-08-27 | Discharge: 2019-08-27 | Disposition: A | Discharge status: Home / Self Care | Payer: Medicare Other | Source: Ambulatory Visit | Attending: Hematology | Admitting: Hematology

## 2019-08-27 ENCOUNTER — Other Ambulatory Visit: Payer: Self-pay

## 2019-08-27 VITALS — BP 140/87 | HR 91 | Temp 97.1°F | Resp 18 | Wt 330.4 lb

## 2019-08-27 DIAGNOSIS — C9000 Multiple myeloma not having achieved remission: Secondary | ICD-10-CM | POA: Diagnosis not present

## 2019-08-27 DIAGNOSIS — R609 Edema, unspecified: Secondary | ICD-10-CM | POA: Diagnosis present

## 2019-08-27 DIAGNOSIS — R0609 Other forms of dyspnea: Secondary | ICD-10-CM | POA: Insufficient documentation

## 2019-08-27 DIAGNOSIS — I10 Essential (primary) hypertension: Secondary | ICD-10-CM | POA: Diagnosis not present

## 2019-08-27 LAB — CBC WITH DIFFERENTIAL/PLATELET
Abs Immature Granulocytes: 0.03 10*3/uL (ref 0.00–0.07)
Basophils Absolute: 0.2 10*3/uL — ABNORMAL HIGH (ref 0.0–0.1)
Basophils Relative: 3 %
Eosinophils Absolute: 0.1 10*3/uL (ref 0.0–0.5)
Eosinophils Relative: 3 %
HCT: 38.9 % (ref 36.0–46.0)
Hemoglobin: 12.5 g/dL (ref 12.0–15.0)
Immature Granulocytes: 1 %
Lymphocytes Relative: 19 %
Lymphs Abs: 0.9 10*3/uL (ref 0.7–4.0)
MCH: 31 pg (ref 26.0–34.0)
MCHC: 32.1 g/dL (ref 30.0–36.0)
MCV: 96.5 fL (ref 80.0–100.0)
Monocytes Absolute: 1.1 10*3/uL — ABNORMAL HIGH (ref 0.1–1.0)
Monocytes Relative: 24 %
Neutro Abs: 2.3 10*3/uL (ref 1.7–7.7)
Neutrophils Relative %: 50 %
Platelets: 264 10*3/uL (ref 150–400)
RBC: 4.03 MIL/uL (ref 3.87–5.11)
RDW: 14 % (ref 11.5–15.5)
WBC: 4.6 10*3/uL (ref 4.0–10.5)
nRBC: 0 % (ref 0.0–0.2)

## 2019-08-27 LAB — COMPREHENSIVE METABOLIC PANEL
ALT: 17 U/L (ref 0–44)
AST: 19 U/L (ref 15–41)
Albumin: 4.3 g/dL (ref 3.5–5.0)
Alkaline Phosphatase: 52 U/L (ref 38–126)
Anion gap: 14 (ref 5–15)
BUN: 24 mg/dL — ABNORMAL HIGH (ref 8–23)
CO2: 36 mmol/L — ABNORMAL HIGH (ref 22–32)
Calcium: 10.1 mg/dL (ref 8.9–10.3)
Chloride: 87 mmol/L — ABNORMAL LOW (ref 98–111)
Creatinine, Ser: 1.56 mg/dL — ABNORMAL HIGH (ref 0.44–1.00)
GFR calc Af Amer: 41 mL/min — ABNORMAL LOW (ref >60)
GFR calc non Af Amer: 35 mL/min — ABNORMAL LOW (ref >60)
Glucose, Bld: 93 mg/dL (ref 70–99)
Potassium: 3 mmol/L — ABNORMAL LOW (ref 3.5–5.1)
Sodium: 137 mmol/L (ref 135–145)
Total Bilirubin: 0.8 mg/dL (ref 0.3–1.2)
Total Protein: 7.6 g/dL (ref 6.5–8.1)

## 2019-08-27 LAB — ECHOCARDIOGRAM COMPLETE
Area-P 1/2: 3.39 cm2
S' Lateral: 2.82 cm
Weight: 5286.4 oz

## 2019-08-27 MED ORDER — SODIUM CHLORIDE 0.9% FLUSH
10.0000 mL | INTRAVENOUS | Status: DC | PRN
  Administered 2019-08-27: 10 mL via INTRAVENOUS

## 2019-08-27 MED ORDER — HEPARIN SOD (PORK) LOCK FLUSH 100 UNIT/ML IV SOLN
500.0000 [IU] | Freq: Once | INTRAVENOUS | Status: AC
  Administered 2019-08-27: 500 [IU] via INTRAVENOUS

## 2019-08-27 MED ORDER — POTASSIUM CHLORIDE CRYS ER 20 MEQ PO TBCR: 20.0000 meq | EXTENDED_RELEASE_TABLET | Freq: Every day | ORAL | 3 refills | Status: DC

## 2019-08-27 NOTE — Progress Notes (Signed)
*  PRELIMINARY RESULTS* Echocardiogram 2D Echocardiogram has been performed.  Kelli Hurley 08/27/2019, 3:00 PM

## 2019-08-27 NOTE — Patient Instructions (Signed)
Kelli Hurley at [facility] Discharge Instructions  You were seen today by Dr. Delton Coombes. He went over your recent results. You did not receive treatment today. Stop taking dexamethasone for the moment, but start taking the pomalidomide tonight. Take the metolazone on Mondays, Wednesdays, and Fridays. You will be prescribed potassium tablets to take daily; take 2 tablets today, then from tomorrow take 1 tablet daily. Dr. Delton Coombes will see you back in 2 weeks for labs and follow up.   Thank you for choosing [place] at [facility] to provide your oncology and hematology care.  To afford each patient quality time with our provider, please arrive at least 15 minutes before your scheduled appointment time.   If you have a lab appointment with the [facility] please come in thru the Main Entrance and check in at the main information desk  You need to re-schedule your appointment should you arrive 10 or more minutes late.  We strive to give you quality time with our providers, and arriving late affects you and other patients whose appointments are after yours.  Also, if you no show three or more times for appointments you may be dismissed from the clinic at the providers discretion.     Again, thank you for choosing [facility].  Our hope is that these requests will decrease the amount of time that you wait before being seen by our physicians.       _____________________________________________________________  Should you have questions after your visit to [facility], please contact our office at [phone] between the hours of 8:00 a.m. and 4:30 p.m.  Voicemails left after 4:00 p.m. will not be returned until the following business day.  For prescription refill requests, have your pharmacy contact our office and allow 72 hours.    Cancer Center Support Programs:   > Cancer Support Group  2nd Tuesday of the month 1pm-2pm,  Journey Room

## 2019-08-27 NOTE — Progress Notes (Signed)
[facility] [address], [place] [address]   CLINIC:  Medical Oncology/Hematology  PCP:  Kelli Flavors, MD [address] [place] [address]  [phone]  REASON FOR VISIT:  Follow-up for multiple myeloma  PRIOR THERAPY: None  CURRENT THERAPY: Carfilzomib, pomalidomide and dexamethasone  INTERVAL HISTORY:  [name], a 64 y.o. female, returns for routine follow-up for her multiple myeloma. Kelli Hurley was last seen on 08/20/2019.  Today she reports that her leg swelling has come down to baseline and her breathing has improved with Bumex. She uses CPAP every night which helps with her sleep. Her numbness in her right hand is stable and her appetite is steadily improving.  She is scheduled for an echo today.   REVIEW OF SYSTEMS:  Review of Systems  Constitutional: Positive for appetite change (moderately decreased) and fatigue (severe).  Neurological: Positive for numbness (R hand numbness stable).  All other systems reviewed and are negative.   PAST MEDICAL/SURGICAL HISTORY:  Past Medical History:  Diagnosis Date  . Anemia   . Arthritis   . Cancer ([place])   . Claustrophobia 10/05/2014  . Hypertension   . Leukopenia 08/03/2014  . Normocytic hypochromic anemia 08/03/2014  . Renal disorder    stage 3    Past Surgical History:  Procedure Laterality Date  . ABDOMINAL HYSTERECTOMY    . CARDIAC SURGERY     90 months old. States she had a leaky valve.   . COLONOSCOPY  03/26/2009   Dr. Oneida Hurley; normal colon, small internal hemorrhoids.  Recommended repeat colonoscopy in 10 years.  . OTHER SURGICAL HISTORY     heart surgery as infant to "repair hole in heart"  . PORTACATH PLACEMENT Left 02/21/2019   Procedure: INSERTION PORT-A-CATH;  Surgeon: Kelli Cagey, MD;  Location: [facility];  Service: General;  Laterality: Left;    SOCIAL HISTORY:  Social History   Socioeconomic History  . Marital status: Legally Separated     Spouse name: Not on file  . Number of children: Not on file  . Years of education: Not on file  . Highest education level: Not on file  Occupational History  . Not on file  Tobacco Use  . Smoking status: Never Smoker  . Smokeless tobacco: Never Used  Vaping Use  . Vaping Use: Never used  Substance and Sexual Activity  . Alcohol use: No  . Drug use: No  . Sexual activity: Not on file    Comment: divorced- 2 daughters  Other Topics Concern  . Not on file  Social History Narrative  . Not on file   Social Determinants of Health   Financial Resource Strain:   . Difficulty of Paying Living Expenses:   Food Insecurity:   . Worried About Charity fundraiser in the Last Year:   . Arboriculturist in the Last Year:   Transportation Needs:   . Film/video editor (Medical):   Kelli Hurley Kitchen Lack of Transportation (Non-Medical):   Physical Activity:   . Days of Exercise per Week:   . Minutes of Exercise per Session:   Stress:   . Feeling of Stress :   Social Connections:   . Frequency of Communication with Friends and Family:   . Frequency of Social Gatherings with Friends and Family:   . Attends Religious Services:   . Active Member of Clubs or Organizations:   . Attends Archivist Meetings:   Kelli Hurley Kitchen Marital  Status:   Intimate Partner Violence:   . Fear of Current or Ex-Partner:   . Emotionally Abused:   Kelli Hurley Kitchen Physically Abused:   . Sexually Abused:     FAMILY HISTORY:  Family History  Problem Relation Age of Onset  . Cancer Mother   . Hypertension Mother   . Cancer Father   . Hypertension Father   . Cancer Maternal Grandmother   . Hypertension Sister   . Hypertension Brother   . Prostate cancer Brother   . Colon cancer Neg Hx   . Colon polyps Neg Hx     CURRENT MEDICATIONS:  Current Outpatient Medications  Medication Sig Dispense Refill  . acyclovir (ZOVIRAX) 400 MG tablet Take 1 tablet (400 mg total) by mouth 2 (two) times daily. 60 tablet 5  . Ascorbic Acid (VITAMIN  C) 1000 MG tablet Take 1,000 mg by mouth daily.     Kelli Hurley Kitchen aspirin EC 81 MG tablet Take 81 mg by mouth daily.    . bumetanide (BUMEX) 1 MG tablet Take 1 tablet (1 mg total) by mouth daily. (Patient taking differently: Take 1 mg by mouth 2 (two) times daily. ) 30 tablet 1  . calcium carbonate (OS-CAL) 1250 (500 Ca) MG chewable tablet Chew 1 tablet by mouth daily.     . cholecalciferol (VITAMIN D) 1000 units tablet Take 1,000 Units by mouth daily.    Kelli Hurley Kitchen gabapentin (NEURONTIN) 300 MG capsule Take 1 capsule (300 mg total) by mouth 2 (two) times daily. 30 capsule 2  . hydrochlorothiazide (HYDRODIURIL) 25 MG tablet TAKE 1 TABLET BY MOUTH ONCE DAILY. 30 tablet 0  . magnesium oxide (MAG-OX) 400 (241.3 Mg) MG tablet TAKE 1 TABLET BY MOUTH TWICE DAILY 60 tablet 6  . metolazone (ZAROXOLYN) 2.5 MG tablet Take 1 tablet (2.5 mg total) by mouth daily. 30 tablet 0  . Multiple Vitamin (TAB-A-VITE) TABS TAKE 1 TABLET BY MOUTH ONCE DAILY. (Patient taking differently: Take 1 tablet by mouth daily. ) 30 tablet 11  . pomalidomide (POMALYST) 4 MG capsule Take 1 capsule (4 mg total) by mouth daily. 21 capsule 0  . trolamine salicylate (ASPERCREME) 10 % cream Apply 1 application topically 2 (two) times daily.    Kelli Hurley Kitchen acetaminophen (TYLENOL 8 HOUR ARTHRITIS PAIN) 650 MG CR tablet Take 650 mg by mouth every 8 (eight) hours as needed for pain.  (Patient not taking: Reported on 08/20/2019)    . albuterol (VENTOLIN HFA) 108 (90 Base) MCG/ACT inhaler Inhale 2 puffs into the lungs every 6 (six) hours as needed for wheezing or shortness of breath. (Patient not taking: Reported on 08/20/2019) 8 g 3  . CARFILZOMIB IV Inject into the vein. (Patient not taking: Reported on 08/27/2019)    . dexamethasone (DECADRON) 4 MG tablet Take 10 tablets (40 mg total) by mouth once a week. On the week you are off of treatment (Patient not taking: Reported on 08/20/2019) 10 tablet 3  . ibuprofen (ADVIL) 400 MG tablet Take 1 tablet (400 mg total) by mouth every 8  (eight) hours as needed. (Patient not taking: Reported on 08/20/2019) 90 tablet 0  . lidocaine-prilocaine (EMLA) cream Apply a quarter sized glob 1 hour prior to treatment. (Patient not taking: Reported on 08/27/2019) 30 g 5   No current facility-administered medications for this visit.   Facility-Administered Medications Ordered in Other Visits  Medication Dose Route Frequency Provider Last Rate Last Admin  . heparin lock flush 100 unit/mL  500 Units Intravenous Once Penland, Kelby Fam, MD      .  sodium chloride 0.9 % injection 10 mL  10 mL Intravenous Once Penland, Kelby Fam, MD      . sodium chloride flush (NS) 0.9 % injection 10 mL  10 mL Intravenous PRN Derek Jack, MD   10 mL at 08/27/19 0945    ALLERGIES:  Allergies  Allergen Reactions  . Diclofenac Swelling    Per pt, facial swelling    PHYSICAL EXAM:  Performance status (ECOG): 1 - Symptomatic but completely ambulatory  Vitals:   08/27/19 0940  BP: 140/87  Pulse: 91  Resp: 18  Temp: (!) 97.1 F (36.2 C)  SpO2: 97%   Wt Readings from Last 3 Encounters:  08/27/19 (!) 330 lb 6.4 oz (149.9 kg)  08/20/19 (!) 361 lb 12.8 oz (164.1 kg)  08/06/19 (!) 375 lb 6 oz (170.3 kg)   Physical Exam Vitals reviewed.  Constitutional:      Appearance: Normal appearance. She is obese.  Cardiovascular:     Rate and Rhythm: Normal rate and regular rhythm.     Pulses: Normal pulses.     Heart sounds: Normal heart sounds.  Pulmonary:     Effort: Pulmonary effort is normal.     Breath sounds: Normal breath sounds.  Musculoskeletal:     Right lower leg: Edema (1+) present.     Left lower leg: Edema (1+) present.  Neurological:     General: No focal deficit present.     Mental Status: She is alert and oriented to person, place, and time.  Psychiatric:        Mood and Affect: Mood normal.        Behavior: Behavior normal.     LABORATORY DATA:  I have reviewed the labs as listed.  CBC Latest Ref Rng & Units 08/20/2019  08/06/2019 07/30/2019  WBC 4.0 - 10.5 K/uL 2.7(L) 4.4 5.5  Hemoglobin 12.0 - 15.0 g/dL 11.0(L) 9.9(L) 10.0(L)  Hematocrit 36 - 46 % 34.8(L) 31.2(L) 31.7(L)  Platelets 150 - 400 K/uL 189 144(L) 152   CMP Latest Ref Rng & Units 08/20/2019 08/06/2019 07/30/2019  Glucose 70 - 99 mg/dL 112(H) 112(H) 124(H)  BUN 8 - 23 mg/dL 16 23 21   Creatinine 0.44 - 1.00 mg/dL 1.00 1.02(H) 1.07(H)  Sodium 135 - 145 mmol/L 141 140 140  Potassium 3.5 - 5.1 mmol/L 3.3(L) 3.6 3.6  Chloride 98 - 111 mmol/L 102 106 103  CO2 22 - 32 mmol/L 30 25 27   Calcium 8.9 - 10.3 mg/dL 9.6 9.2 8.9  Total Protein 6.5 - 8.1 g/dL 6.2(L) 6.2(L) 6.2(L)  Total Bilirubin 0.3 - 1.2 mg/dL 0.9 0.7 0.7  Alkaline Phos 38 - 126 U/L 44 52 42  AST 15 - 41 U/L 16 22 14(L)  ALT 0 - 44 U/L 19 36 15      Component Value Date/Time   RBC 3.51 (L) 08/20/2019 0947   MCV 99.1 08/20/2019 0947   MCH 31.3 08/20/2019 0947   MCHC 31.6 08/20/2019 0947   RDW 16.3 (H) 08/20/2019 0947   LYMPHSABS 0.6 (L) 08/20/2019 0947   MONOABS 0.4 08/20/2019 0947   EOSABS 0.1 08/20/2019 0947   BASOSABS 0.0 08/20/2019 0947   Lab Results  Component Value Date   TOTALPROTELP 5.9 (L) 07/09/2019   ALBUMINELP 3.3 07/09/2019   A1GS 0.2 07/09/2019   A2GS 0.6 07/09/2019   BETS 0.9 07/09/2019   GAMS 0.8 07/09/2019   MSPIKE 0.3 (H) 07/09/2019   SPEI Comment 07/09/2019    Lab Results  Component Value Date  KPAFRELGTCHN 24.8 (H) 07/09/2019   LAMBDASER 18.1 07/09/2019   KAPLAMBRATIO 1.37 07/09/2019   Lab Results  Component Value Date   LDH 204 (H) 07/09/2019   LDH 277 (H) 06/04/2019   LDH 246 (H) 05/14/2019      DIAGNOSTIC IMAGING:  I have independently reviewed the scans and discussed with the patient. DG Chest 2 View  Result Date: 08/07/2019 CLINICAL DATA:  New cough, sputum production EXAM: CHEST - 2 VIEW COMPARISON:  06/27/2019 FINDINGS: Cardiomegaly. Unchanged diffuse interstitial opacity and pulmonary vascular prominence. Disc degenerative disease of  the thoracic spine. Left chest port catheter. IMPRESSION: Cardiomegaly with unchanged diffuse interstitial opacity, likely edema. No new or focal airspace opacity. Electronically Signed   By: Eddie Candle M.D.   On: 08/07/2019 08:02     ASSESSMENT:  1. Relapsed IgG kappa multiple myeloma with high risk features: -5cycles of carfilzomib, pomalidomide and dexamethasone from 03/05/2019 through 06/25/2019. -Myeloma panel on 07/09/2019 shows M spike 0.3 g, slightly up from 0.2 g previously. However free light chain ratio has 1.37 and improved. Kappa light chains are 24.8.  2. Shortness of breath on exertion: -2D echo on 02/09/2019 shows EF 60-65%. No LVH. -Chest CT PE protocol on 02/24/2019 was negative. -Cardiac MRI on 03/07/2019 shows LVEF 56% with no amyloid. Troponin T and proBNP were negative. -PFTs show low expiratory reserve volume consistent with body habitus. Moderate diffusion defect which corrects to normal. FVC, FEV1, FEV1/FVC ratio are within normal limits. FVC is reduced relative to SVC indicates air-trapping. No significant response after bronchodilators. Reduced diffusion capacity indicates moderate loss of functional alveolar capillary space. -She was evaluated by Dr.Wert. -2D echocardiogram on 08/27/2019 shows LVEF 60 to 65%.  No LVH.  3.  Myeloma bone disease: -Bone density on 07/04/2019 shows T score 1.8.   PLAN:  1. Relapsed IgG kappa multiple myeloma with high risk features: -Myeloma treatments currently on hold due to fluid retention. -I reviewed echocardiogram results.  EF is stable. -Swelling of the lower extremities improved quite well as well as her breathing. -We will start her on pomalidomide at this time.  We will continue to hold dexamethasone and carfilzomib. -She has an appointment at [facility] next week.  I plan to see her back in 2 weeks for follow-up.  2. Shortness of breath on exertion: -This has improved since diuretics were started.   She will also continue bronchodilators.  3. ID prophylaxis: -Continue acyclovir twice daily.  Continue aspirin for thromboprophylaxis.  4. Neuropathy: -Continue gabapentin 300 mg 2-3 times a day.  5. Lower extremity edema: -Continue Bumex 2 mg daily.  Labs today shows creatinine increased to 1.56. -Cut back on metolazone 2.5 mg to Monday, Wednesday and Friday.  6. Myeloma bone disease: -Continue Zometa monthly.  7.  Hypokalemia: -Potassium today 3.0.  She will take potassium 40 mEq today and start taking 20 mEq daily.  Orders placed this encounter:  No orders of the defined types were placed in this encounter.    Derek Jack, MD [facility] [phone]   I, Milinda Antis, am acting as a scribe for Dr. Sanda Linger.  I, Derek Jack MD, have reviewed the above documentation for accuracy and completeness, and I agree with the above.

## 2019-08-28 ENCOUNTER — Other Ambulatory Visit (HOSPITAL_COMMUNITY): Payer: Self-pay | Admitting: Hematology

## 2019-08-28 DIAGNOSIS — C9000 Multiple myeloma not having achieved remission: Secondary | ICD-10-CM

## 2019-08-28 LAB — KAPPA/LAMBDA LIGHT CHAINS
Kappa free light chain: 41 mg/L — ABNORMAL HIGH (ref 3.3–19.4)
Kappa, lambda light chain ratio: 1.53 (ref 0.26–1.65)
Lambda free light chains: 26.8 mg/L — ABNORMAL HIGH (ref 5.7–26.3)

## 2019-08-29 LAB — PROTEIN ELECTROPHORESIS, SERUM
A/G Ratio: 1.2 (ref 0.7–1.7)
Albumin ELP: 3.7 g/dL (ref 2.9–4.4)
Alpha-1-Globulin: 0.3 g/dL (ref 0.0–0.4)
Alpha-2-Globulin: 0.8 g/dL (ref 0.4–1.0)
Beta Globulin: 1.2 g/dL (ref 0.7–1.3)
Gamma Globulin: 0.8 g/dL (ref 0.4–1.8)
Globulin, Total: 3.2 g/dL (ref 2.2–3.9)
M-Spike, %: 0.2 g/dL — ABNORMAL HIGH
Total Protein ELP: 6.9 g/dL (ref 6.0–8.5)

## 2019-09-01 ENCOUNTER — Other Ambulatory Visit (HOSPITAL_COMMUNITY): Payer: Self-pay | Admitting: Hematology

## 2019-09-02 ENCOUNTER — Encounter: Payer: Self-pay | Admitting: Internal Medicine

## 2019-09-02 ENCOUNTER — Other Ambulatory Visit: Payer: Self-pay

## 2019-09-02 ENCOUNTER — Ambulatory Visit (INDEPENDENT_AMBULATORY_CARE_PROVIDER_SITE_OTHER): Payer: Medicare Other | Admitting: Internal Medicine

## 2019-09-02 DIAGNOSIS — R0609 Other forms of dyspnea: Secondary | ICD-10-CM

## 2019-09-02 DIAGNOSIS — R0600 Dyspnea, unspecified: Secondary | ICD-10-CM

## 2019-09-02 DIAGNOSIS — G4734 Idiopathic sleep related nonobstructive alveolar hypoventilation: Secondary | ICD-10-CM

## 2019-09-02 NOTE — Progress Notes (Signed)
Kelli Hurley, female    DOB: [birth year],     MRN: [id number]   Brief patient profile:  11 yobf never smoker, never allergies / asthma with onset of sob attributed to  Anemia 2016 and dx MM June 2018 with w/u Dr Luan Pulling she says neg but  rx 02 and 2 inhalers one of which was powder that made her cough but eventually did  Ok on maint rx? zometa? By [facility]  s 02 or inhalers with MMRC1 x several years  then more sob again since Nov 2020   at dx of recurrent MM and  restarted proair  "helps until I get up and walk again"  referred to pulmonary clinic 06/27/2019 by Dr   Delton Coombes.     History of Present Illness  06/27/2019  Pulmonary/ 1st office eval/Edeline Greening  Chief Complaint  Patient presents with  . Pulmonary Consult    Referred by Dr. Delton Coombes. Pt c/o SOB since Jan 2021. Pt states has recurrent lung CA.- going through chemo now. She is winded just walking short distances such as lobby to exam room today.   Dyspnea: 50 ft no progression since onset and occurred prior to receiving any chemo  Cough: assoc with noct cough/wheeze  variably discolored > on zpak  Sleep: on side /bed flat  SABA use: doesn't really help at all  rec Check labs  NP eval  07/24/19 rec Stop Hydrochlorothiazide .  Restart Lasix 20mg  daily , take extra if needed for swelling .  Set up Overnight oximetry test  Albuterol Inhaler As needed  Wheezing    08/19/19  patient slept for 8 hours and 43 minutes, testing showing time spent below 88% 4 hours and 6 minutes, lowest oxygen level appears to be 68% rec 2lpm hs  Return with all meds   09/02/2019  f/u ov/Sincere Liuzzi re: sob better with diuresis / off of chemo as of  08/06/19 / no meds/ does not know names Chief Complaint  Patient presents with  . Follow-up    shortness of breat with exertion  Dyspnea: improved  Cough: none Sleeping: bed is flat/ on 2.5 lpm feels she sleeps fine s am ha  SABA use: not using 02: 2.5 lpm hs    No obvious day to day or daytime variability or  assoc excess/ purulent sputum or mucus plugs or hemoptysis or cp or chest tightness, subjective wheeze or overt sinus or hb symptoms.   Sleeping as above  without nocturnal  or early am exacerbation  of respiratory  c/o's or need for noct saba. Also denies any obvious fluctuation of symptoms with weather or environmental changes or other aggravating or alleviating factors except as outlined above   No unusual exposure hx or h/o childhood pna/ asthma or knowledge of premature birth.  Current Allergies, Complete Past Medical History, Past Surgical History, Family History, and Social History were reviewed in [employer] record.  ROS  The following are not active complaints unless bolded Hoarseness, sore throat, dysphagia, dental problems, itching, sneezing,  nasal congestion or discharge of excess mucus or purulent secretions, ear ache,   fever, chills, sweats, unintended wt loss or wt gain, classically pleuritic or exertional cp,  orthopnea pnd or arm/hand swelling  or leg swelling, presyncope, palpitations, abdominal pain, anorexia, nausea, vomiting, diarrhea  or change in bowel habits or change in bladder habits, change in stools or change in urine, dysuria, hematuria,  rash, arthralgias, visual complaints, headache, numbness, weakness or ataxia or problems with walking  or coordination,  change in mood or  memory.        Current Meds - - NOTE:   Unable to verify as accurately reflecting what pt takes     Medication Sig  . acetaminophen (TYLENOL 8 HOUR ARTHRITIS PAIN) 650 MG CR tablet Take 650 mg by mouth every 8 (eight) hours as needed for pain.   Marland Kitchen acyclovir (ZOVIRAX) 400 MG tablet TAKE 1 TABLET BY MOUTH TWICE DAILY  . albuterol (VENTOLIN HFA) 108 (90 Base) MCG/ACT inhaler Inhale 2 puffs into the lungs every 6 (six) hours as needed for wheezing or shortness of breath.  . Ascorbic Acid (VITAMIN C) 1000 MG tablet Take 1,000 mg by mouth daily.   Marland Kitchen aspirin EC 81 MG tablet Take  81 mg by mouth daily.  . bumetanide (BUMEX) 1 MG tablet Take 2 tablets (2 mg total) by mouth daily.  . calcium carbonate (OS-CAL) 1250 (500 Ca) MG chewable tablet Chew 1 tablet by mouth daily.   . cholecalciferol (VITAMIN D) 1000 units tablet Take 1,000 Units by mouth daily.  Marland Kitchen gabapentin (NEURONTIN) 300 MG capsule Take 1 capsule (300 mg total) by mouth 2 (two) times daily.  . hydrochlorothiazide (HYDRODIURIL) 25 MG tablet TAKE 1 TABLET BY MOUTH ONCE DAILY.  Marland Kitchen ibuprofen (ADVIL) 400 MG tablet Take 1 tablet (400 mg total) by mouth every 8 (eight) hours as needed.  . lidocaine-prilocaine (EMLA) cream Apply a quarter sized glob 1 hour prior to treatment.  . magnesium oxide (MAG-OX) 400 (241.3 Mg) MG tablet TAKE 1 TABLET BY MOUTH TWICE DAILY  . Multiple Vitamin (TAB-A-VITE) TABS TAKE 1 TABLET BY MOUTH ONCE DAILY. (Patient taking differently: Take 1 tablet by mouth daily. )  . pomalidomide (POMALYST) 4 MG capsule Take 1 capsule (4 mg total) by mouth daily.  . potassium chloride SA (KLOR-CON) 20 MEQ tablet Take 1 tablet (20 mEq total) by mouth daily.  Marland Kitchen trolamine salicylate (ASPERCREME) 10 % cream Apply 1 application topically 2 (two) times daily.                   Past Medical History:  Diagnosis Date  . Anemia   . Arthritis   . Cancer ([place])   . Claustrophobia 10/05/2014  . Hypertension   . Leukopenia 08/03/2014  . Normocytic hypochromic anemia 08/03/2014  . Renal disorder    stage 3         Objective:    amb very pleasant obese bf nad     09/02/2019         352 06/27/19 (!) 354 lb (160.6 kg)  06/25/19 (!) 356 lb 9.6 oz (161.8 kg)  06/11/19 (!) 350 lb 12.8 oz (159.1 kg)      Vital signs reviewed  09/02/2019  - Note at rest 02 sats  93% on RA   HEENT : pt wearing mask not removed for exam due to covid -19 concerns.    NECK :  without JVD/Nodes/TM/ nl carotid upstrokes bilaterally   LUNGS: no acc muscle use,  Nl contour chest which is clear to A and P bilaterally without cough  on insp or exp maneuvers   CV:  RRR  no s3 or murmur or increase in P2, and trace pitting both lower ext edema   ABD:  Obese soft and nontender with nl inspiratory excursion in the supine position. No bruits or organomegaly appreciated, bowel sounds nl  MS:  Nl gait/ ext warm without deformities, calf tenderness, cyanosis or clubbing No obvious  joint restrictions   SKIN: warm and dry without lesions    NEURO:  alert, approp, nl sensorium with  no motor or cerebellar deficits apparent.              I personally reviewed images and agree with radiology impression as follows:  CXR:   Pa and lateral 08/06/19 Cardiomegaly with unchanged diffuse interstitial opacity, likely edema. No new or focal airspace opacity.                  Assessment

## 2019-09-02 NOTE — Assessment & Plan Note (Signed)
Onset 2016 with dx of MM - recurrent doe with dx of recurrent MM Jan 2021 prior to rx  - Echo 02/19/19 nl x for mild LAE  - CTa 02/24/19 no large PE, no ILD  - Neg MR cardiac 03/07/19 neg for amyloid, nl ef  - PFT's  05/20/19   FEV1 1.92 (103 % ) ratio 0.91  p 6 % improvement from saba p nothing prior to study with DLCO  11.73 (62%) corrects to 3.66 (85 %)  for alv volume and FV curve nl and ERV 36%   -  06/27/2019   Walked RA  approx   100 ft  @ slow pace  stopped due to  Sob with sats still 91%     Likely does have element of diastolic dysfunction superimposed on MO  (see separate a/p)   Clearly improved with diuresis and holding chemo > f/u oncology planned

## 2019-09-02 NOTE — Assessment & Plan Note (Signed)
PFTs 05/20/19 :  ERV  36% c/w effects of obesity on lung volumes   Body mass index is 64.38 kg/m.  -  trending minimally down with diuresis  Lab Results  Component Value Date   TSH 0.708 07/02/2019     Contributing to gerd risk/ doe/reviewed the need and the process to achieve and maintain neg calorie balance > defer f/u primary care including intermittently monitoring thyroid status

## 2019-09-02 NOTE — Assessment & Plan Note (Signed)
7/13/21patient slept for 8 hours and 43 minutes, testing showing time spent below 88% 4 hours and 6 minutes, lowest oxygen level appears to be 68% >>> 2lpm hs   Clearly better on 2lpm s hypersomnolence or am HA so rec continue 2.5 lpm and f/u in 6 m, work on wt loss/ edema in meantime          Each maintenance medication was reviewed in detail including emphasizing most importantly the difference between maintenance and prns and under what circumstances the prns are to be triggered using an action plan format where appropriate.  Total time for H and P, chart review, counseling,   and generating customized AVS unique to this office visit / charting = 37 min

## 2019-09-02 NOTE — Patient Instructions (Signed)
No change in my recommendations:   2.5 lpm oxygen at bedtime   Try albuterol 15 min before an activity that you know would make you short of breath and see if it makes any difference and if makes none then don't take it after activity unless you can't catch your breath.  Also ok to use your albuterol as a rescue medication to be used if you can't catch your breath by resting or doing a relaxed purse lip breathing pattern.  - The less you use it, the better it will work when you need it. - Ok to use up to 2 puffs  every 4 hours if you must but call for immediate appointment if use goes up over your usual need - Don't leave home without it !!  (think of it like the spare tire for your car)      Please schedule a follow up visit in 6 months but call sooner if needed

## 2019-09-03 ENCOUNTER — Other Ambulatory Visit (HOSPITAL_COMMUNITY): Payer: Medicare Other

## 2019-09-03 ENCOUNTER — Ambulatory Visit (HOSPITAL_COMMUNITY): Payer: Medicare Other

## 2019-09-03 ENCOUNTER — Ambulatory Visit (HOSPITAL_COMMUNITY): Payer: Medicare Other | Admitting: Hematology

## 2019-09-04 ENCOUNTER — Other Ambulatory Visit (HOSPITAL_COMMUNITY): Payer: Self-pay | Admitting: *Deleted

## 2019-09-04 MED ORDER — POMALIDOMIDE 4 MG PO CAPS: 4.0000 mg | ORAL_CAPSULE | Freq: Every day | ORAL | 0 refills | Status: DC

## 2019-09-04 NOTE — Telephone Encounter (Signed)
Chart reviewed, pomalyst refilled.  

## 2019-09-08 ENCOUNTER — Telehealth: Payer: Self-pay | Admitting: Emergency Medicine

## 2019-09-08 NOTE — Telephone Encounter (Signed)
-----   Message from Tanda Rockers, MD sent at 09/04/2019 10:55 AM EDT ----- Regarding: RE: clearance Yes that should be fine in decubitus position - on her back would be problematic due to wt  ----- Message ----- From: Luanne Bras, CMA Sent: 09/04/2019   8:36 AM EDT To: Tanda Rockers, MD Subject: RE: clearance                                  I'm sorry. A TCS is a colonoscopy. Would the patient be ok to have one with propofol? ----- Message ----- From: Tanda Rockers, MD Sent: 09/04/2019   8:26 AM EDT To: Luanne Bras, CMA Subject: RE: clearance                                  Sorry I don't know what TCS is but her main problem is obesity, not lung dz ----- Message ----- From: Luanne Bras, CMA Sent: 09/04/2019   7:46 AM EDT To: Tanda Rockers, MD Subject: clearance                                      One of our providers Aliene Altes would like clearance on the following patient for a TCS w/ propofol

## 2019-09-08 NOTE — Telephone Encounter (Signed)
Please advise message [facility] thanks

## 2019-09-08 NOTE — Telephone Encounter (Signed)
OK to arrange colonoscopy with propofol with Dr. Abbey Chatters. ASA III. Dx: Colon cancer screening

## 2019-09-08 NOTE — Telephone Encounter (Signed)
Received clearance for procedure

## 2019-09-08 NOTE — Telephone Encounter (Signed)
Added to list to call

## 2019-09-10 ENCOUNTER — Inpatient Hospital Stay (HOSPITAL_BASED_OUTPATIENT_CLINIC_OR_DEPARTMENT_OTHER): Payer: Medicare Other | Admitting: Hematology

## 2019-09-10 ENCOUNTER — Inpatient Hospital Stay (HOSPITAL_COMMUNITY): Payer: Medicare Other | Attending: Hematology

## 2019-09-10 ENCOUNTER — Inpatient Hospital Stay (HOSPITAL_COMMUNITY): Payer: Medicare Other

## 2019-09-10 ENCOUNTER — Other Ambulatory Visit: Payer: Self-pay

## 2019-09-10 VITALS — BP 137/75 | HR 71 | Temp 96.9°F | Resp 20

## 2019-09-10 DIAGNOSIS — Z8719 Personal history of other diseases of the digestive system: Secondary | ICD-10-CM | POA: Diagnosis not present

## 2019-09-10 DIAGNOSIS — C9000 Multiple myeloma not having achieved remission: Secondary | ICD-10-CM | POA: Diagnosis present

## 2019-09-10 DIAGNOSIS — Z8249 Family history of ischemic heart disease and other diseases of the circulatory system: Secondary | ICD-10-CM | POA: Insufficient documentation

## 2019-09-10 DIAGNOSIS — R600 Localized edema: Secondary | ICD-10-CM | POA: Insufficient documentation

## 2019-09-10 DIAGNOSIS — M7989 Other specified soft tissue disorders: Secondary | ICD-10-CM | POA: Diagnosis not present

## 2019-09-10 DIAGNOSIS — R5383 Other fatigue: Secondary | ICD-10-CM | POA: Insufficient documentation

## 2019-09-10 DIAGNOSIS — E876 Hypokalemia: Secondary | ICD-10-CM | POA: Diagnosis not present

## 2019-09-10 DIAGNOSIS — Z8042 Family history of malignant neoplasm of prostate: Secondary | ICD-10-CM | POA: Insufficient documentation

## 2019-09-10 DIAGNOSIS — C9001 Multiple myeloma in remission: Secondary | ICD-10-CM

## 2019-09-10 DIAGNOSIS — R200 Anesthesia of skin: Secondary | ICD-10-CM | POA: Insufficient documentation

## 2019-09-10 DIAGNOSIS — G629 Polyneuropathy, unspecified: Secondary | ICD-10-CM | POA: Insufficient documentation

## 2019-09-10 DIAGNOSIS — Z79899 Other long term (current) drug therapy: Secondary | ICD-10-CM | POA: Diagnosis not present

## 2019-09-10 DIAGNOSIS — Z809 Family history of malignant neoplasm, unspecified: Secondary | ICD-10-CM | POA: Diagnosis not present

## 2019-09-10 DIAGNOSIS — R0602 Shortness of breath: Secondary | ICD-10-CM | POA: Diagnosis not present

## 2019-09-10 LAB — COMPREHENSIVE METABOLIC PANEL
ALT: 14 U/L (ref 0–44)
AST: 17 U/L (ref 15–41)
Albumin: 3.7 g/dL (ref 3.5–5.0)
Alkaline Phosphatase: 49 U/L (ref 38–126)
Anion gap: 11 (ref 5–15)
BUN: 21 mg/dL (ref 8–23)
CO2: 29 mmol/L (ref 22–32)
Calcium: 9.8 mg/dL (ref 8.9–10.3)
Chloride: 97 mmol/L — ABNORMAL LOW (ref 98–111)
Creatinine, Ser: 1.23 mg/dL — ABNORMAL HIGH (ref 0.44–1.00)
GFR calc Af Amer: 54 mL/min — ABNORMAL LOW (ref >60)
GFR calc non Af Amer: 47 mL/min — ABNORMAL LOW (ref >60)
Glucose, Bld: 115 mg/dL — ABNORMAL HIGH (ref 70–99)
Potassium: 3.9 mmol/L (ref 3.5–5.1)
Sodium: 137 mmol/L (ref 135–145)
Total Bilirubin: 0.6 mg/dL (ref 0.3–1.2)
Total Protein: 7.1 g/dL (ref 6.5–8.1)

## 2019-09-10 LAB — CBC WITH DIFFERENTIAL/PLATELET
Abs Immature Granulocytes: 0.07 10*3/uL (ref 0.00–0.07)
Basophils Absolute: 0 10*3/uL (ref 0.0–0.1)
Basophils Relative: 1 %
Eosinophils Absolute: 0.2 10*3/uL (ref 0.0–0.5)
Eosinophils Relative: 6 %
HCT: 32.6 % — ABNORMAL LOW (ref 36.0–46.0)
Hemoglobin: 10.4 g/dL — ABNORMAL LOW (ref 12.0–15.0)
Immature Granulocytes: 2 %
Lymphocytes Relative: 25 %
Lymphs Abs: 1 10*3/uL (ref 0.7–4.0)
MCH: 30.7 pg (ref 26.0–34.0)
MCHC: 31.9 g/dL (ref 30.0–36.0)
MCV: 96.2 fL (ref 80.0–100.0)
Monocytes Absolute: 0.4 10*3/uL (ref 0.1–1.0)
Monocytes Relative: 10 %
Neutro Abs: 2.3 10*3/uL (ref 1.7–7.7)
Neutrophils Relative %: 56 %
Platelets: 212 10*3/uL (ref 150–400)
RBC: 3.39 MIL/uL — ABNORMAL LOW (ref 3.87–5.11)
RDW: 14.1 % (ref 11.5–15.5)
WBC: 4 10*3/uL (ref 4.0–10.5)
nRBC: 0 % (ref 0.0–0.2)

## 2019-09-10 MED ORDER — SODIUM CHLORIDE 0.9 % IV SOLN: Freq: Once | INTRAVENOUS | Status: AC

## 2019-09-10 MED ORDER — ZOLEDRONIC ACID 4 MG/100ML IV SOLN
4.0000 mg | Freq: Once | INTRAVENOUS | Status: AC
  Administered 2019-09-10: 4 mg via INTRAVENOUS
  Filled 2019-09-10: qty 100

## 2019-09-10 MED ORDER — HEPARIN SOD (PORK) LOCK FLUSH 100 UNIT/ML IV SOLN
500.0000 [IU] | Freq: Once | INTRAVENOUS | Status: AC | PRN
  Administered 2019-09-10: 500 [IU]

## 2019-09-10 MED ORDER — SODIUM CHLORIDE 0.9% FLUSH
10.0000 mL | Freq: Once | INTRAVENOUS | Status: AC | PRN
  Administered 2019-09-10: 10 mL

## 2019-09-10 MED ORDER — METOLAZONE 2.5 MG PO TABS: 2.5000 mg | ORAL_TABLET | Freq: Every day | ORAL | 0 refills | Status: DC

## 2019-09-10 NOTE — Progress Notes (Signed)
1050 Labs reviewed with and pt seen by Dr. Delton Coombes and pt to receive only Zometa infusion today:no Kyprolis today per MD    Kelli Hurley tolerated Zometa infusion well without complaints or incident. Calcium 9.8 today and pt denied any tooth or jaw pain and no recent or future dental visits prior to administering this medication. Pt continues to take her Calcium and Pomalyst PO as prescribed without issues.VSS Pt discharged via wheelchair in satisfactory condition

## 2019-09-10 NOTE — Progress Notes (Signed)
[place], [place] [address]   CLINIC:  Medical Oncology/Hematology  PCP:  Alfonse Flavors, MD [address] [place] [address] [phone]   REASON FOR VISIT:  Follow-up for multiple myeloma  PRIOR THERAPY: None  NGS Results: Not done  CURRENT THERAPY: Carfilzomib, pomalidomide and dexamethasone  BRIEF ONCOLOGIC HISTORY:  Oncology History  Multiple myeloma ([place])  08/07/2014 Imaging   Bone Survey- No lytic lesions are noted in the visualized skeleton.   09/03/2014 Bone Marrow Biopsy   NORMOCELLULAR BONE MARROW FOR AGE WITH PLASMA CELL NEOPLASM.  The plasma cell component is increased in the marrow representing an estimated 18% of all cells. Cytogenetics with 13q-, 17p- (high risk disease)   09/03/2014 Pathology Results   Cytogenetics with 13q-, 17p- (high risk disease)   09/23/2014 Initial Diagnosis   Multiple myeloma   09/30/2014 PET scan   No abnormal hypermetabolism in the neck, chest, abdomen or pelvis.   10/05/2014 - 03/22/2015 Chemotherapy   RVD   10/28/2014 Treatment Plan Change   Issues related to getting Revlimid in a timely fashion, therefore, she received her Revlimid on 9/21 resulting in a 12 day cycle this time instead of a 14 day cycle   11/02/2014 Imaging   CTA chest- No evidence for a large or central pulmonary embolism as described.  8 mm density along the right minor fissure could represent focal pleural thickening but indeterminate. If the patient is at high risk for bronchogenic carcinoma, follow-up c   11/23/2014 Miscellaneous   Zometa 4 mg IV monthly   04/07/2015 Bone Marrow Biopsy   Normocellular marrow with 2-4% clonal plasma cells by immunohistochemistry. FISH and cytogenetics were normal [facility])     04/2015 Miscellaneous   PRETRANSPLANT EVALUATION:  Pulmonary function tests: FEV1 100.3% / DLCO 97.9%  Echocardiogram: Normal LV function with EF 60-65%    04/27/2015 Procedure   Stem cell  mobilization with filgrastim and Mozobil [facility])   05/06/2015 Miscellaneous   BMT conditioning regimen with high-dose Melphalan given [facility], [name]); Day -1   05/07/2015 Bone Marrow Transplant   Outpatient autologous stem cell transplant [facility], [name]); Day 0   05/18/2015 Miscellaneous   WBC engraftment;  did not require platelet transfusion during her transplant process. Lowest platelet count 28,000    05/20/2015 Procedure   Tunneled catheter removed [facility])   09/08/2015 -  Chemotherapy   Velcade every 2 weeks   12/15/2015 Miscellaneous   Zometa re-instituted.    12/23/2015 Imaging   Bone density- BMD as determined from Forearm Radius 33% is 0.799 g/cm2 with a T-Score of 1.2. This patient is considered normal according to [facility]) criteria.   04/17/2016 Miscellaneous   Started maintenance with Ninlaro.   05/04/2016 Treatment Plan Change   Zometa switched to Xgeva injection monthly given difficult IV access.    03/05/2019 -  Chemotherapy   The patient had carfilzomib (KYPROLIS) 40 mg in dextrose 5 % 50 mL chemo infusion, 18 mg/m2 = 44 mg, Intravenous,  Once, 6 of 8 cycles Dose modification: 56 mg/m2 (original dose 56 mg/m2, Cycle 1, Reason: Provider Judgment) Administration: 40 mg (03/05/2019), 120 mg (03/12/2019), 120 mg (03/20/2019), 120 mg (04/02/2019), 120 mg (04/09/2019), 120 mg (04/16/2019), 120 mg (04/30/2019), 120 mg (05/08/2019), 120 mg (05/14/2019), 120 mg (05/28/2019), 120 mg (06/04/2019), 120 mg (06/11/2019), 120 mg (06/25/2019), 120 mg (07/02/2019), 120 mg (07/09/2019), 120 mg (07/23/2019), 120 mg (07/30/2019), 120 mg (08/06/2019)  for chemotherapy  treatment.      CANCER STAGING: Cancer Staging Multiple myeloma [facility]) Staging form: Multiple Myeloma, AJCC 6th Edition - Clinical stage from 10/26/2014: Stage IIA - Signed by Baird Cancer, PA-C on 10/26/2014 - Pathologic: No stage assigned - Unsigned   INTERVAL HISTORY:  [name], a  64 y.o. female, returns for routine follow-up and consideration for next cycle of chemotherapy. Jiya was last seen on 08/27/2019.  Due for cycle #7 of carfilzomib today.   Overall, today she tells me she has been feeling pretty well. Her breathing has improved tremendously. She denies N/V. She has started taking Pomalyst about 2 weeks ago for 3 weeks on, 1 week off.  She will discontinue carfilzomib due to her fluid retention and just continue with Pomalyst.   REVIEW OF SYSTEMS:  Review of Systems  Constitutional: Positive for appetite change (mildly decreased) and fatigue (moderate).  Respiratory: Positive for shortness of breath (improving).   Cardiovascular: Positive for leg swelling.  Neurological: Positive for numbness (R hand).  All other systems reviewed and are negative.   PAST MEDICAL/SURGICAL HISTORY:  Past Medical History:  Diagnosis Date  . Anemia   . Arthritis   . Cancer ([place])   . Claustrophobia 10/05/2014  . Hypertension   . Leukopenia 08/03/2014  . Normocytic hypochromic anemia 08/03/2014  . Renal disorder    stage 3    Past Surgical History:  Procedure Laterality Date  . ABDOMINAL HYSTERECTOMY    . CARDIAC SURGERY     14 months old. States she had a leaky valve.   . COLONOSCOPY  03/26/2009   Dr. Oneida Alar; normal colon, small internal hemorrhoids.  Recommended repeat colonoscopy in 10 years.  . OTHER SURGICAL HISTORY     heart surgery as infant to "repair hole in heart"  . PORTACATH PLACEMENT Left 02/21/2019   Procedure: INSERTION PORT-A-CATH;  Surgeon: Virl Cagey, MD;  Location: [facility];  Service: General;  Laterality: Left;    SOCIAL HISTORY:  Social History   Socioeconomic History  . Marital status: Legally Separated    Spouse name: Not on file  . Number of children: Not on file  . Years of education: Not on file  . Highest education level: Not on file  Occupational History  . Not on file  Tobacco Use  . Smoking status: Never Smoker  .  Smokeless tobacco: Never Used  Vaping Use  . Vaping Use: Never used  Substance and Sexual Activity  . Alcohol use: No  . Drug use: No  . Sexual activity: Not on file    Comment: divorced- 2 daughters  Other Topics Concern  . Not on file  Social History Narrative  . Not on file   Social Determinants of Health   Financial Resource Strain:   . Difficulty of Paying Living Expenses:   Food Insecurity:   . Worried About Charity fundraiser in the Last Year:   . Arboriculturist in the Last Year:   Transportation Needs:   . Film/video editor (Medical):   Marland Kitchen Lack of Transportation (Non-Medical):   Physical Activity:   . Days of Exercise per Week:   . Minutes of Exercise per Session:   Stress:   . Feeling of Stress :   Social Connections:   . Frequency of Communication with Friends and Family:   . Frequency of Social Gatherings with Friends and Family:   . Attends Religious Services:   . Active Member of Clubs or  Organizations:   . Attends Archivist Meetings:   Marland Kitchen Marital Status:   Intimate Partner Violence:   . Fear of Current or Ex-Partner:   . Emotionally Abused:   Marland Kitchen Physically Abused:   . Sexually Abused:     FAMILY HISTORY:  Family History  Problem Relation Age of Onset  . Cancer Mother   . Hypertension Mother   . Cancer Father   . Hypertension Father   . Cancer Maternal Grandmother   . Hypertension Sister   . Hypertension Brother   . Prostate cancer Brother   . Colon cancer Neg Hx   . Colon polyps Neg Hx     CURRENT MEDICATIONS:  Current Outpatient Medications  Medication Sig Dispense Refill  . acyclovir (ZOVIRAX) 400 MG tablet TAKE 1 TABLET BY MOUTH TWICE DAILY 60 tablet 0  . Ascorbic Acid (VITAMIN C) 1000 MG tablet Take 1,000 mg by mouth daily.     Marland Kitchen aspirin EC 81 MG tablet Take 81 mg by mouth daily.    . bumetanide (BUMEX) 1 MG tablet Take 2 tablets (2 mg total) by mouth daily. 60 tablet 3  . calcium carbonate (OS-CAL) 1250 (500 Ca) MG  chewable tablet Chew 1 tablet by mouth daily.     . cholecalciferol (VITAMIN D) 1000 units tablet Take 1,000 Units by mouth daily.    Marland Kitchen dexamethasone (DECADRON) 4 MG tablet Take 10 tablets (40 mg total) by mouth once a week. On the week you are off of treatment 10 tablet 3  . gabapentin (NEURONTIN) 300 MG capsule Take 1 capsule (300 mg total) by mouth 2 (two) times daily. 30 capsule 2  . lidocaine-prilocaine (EMLA) cream Apply a quarter sized glob 1 hour prior to treatment. 30 g 5  . magnesium oxide (MAG-OX) 400 (241.3 Mg) MG tablet TAKE 1 TABLET BY MOUTH TWICE DAILY 60 tablet 6  . metolazone (ZAROXOLYN) 2.5 MG tablet Take 1 tablet (2.5 mg total) by mouth daily. 30 tablet 0  . Multiple Vitamin (TAB-A-VITE) TABS TAKE 1 TABLET BY MOUTH ONCE DAILY. (Patient taking differently: Take 1 tablet by mouth daily. ) 30 tablet 11  . pomalidomide (POMALYST) 4 MG capsule Take 1 capsule (4 mg total) by mouth daily. 21 capsule 0  . potassium chloride SA (KLOR-CON) 20 MEQ tablet Take 1 tablet (20 mEq total) by mouth daily. 30 tablet 3  . trolamine salicylate (ASPERCREME) 10 % cream Apply 1 application topically 2 (two) times daily.    Marland Kitchen acetaminophen (TYLENOL 8 HOUR ARTHRITIS PAIN) 650 MG CR tablet Take 650 mg by mouth every 8 (eight) hours as needed for pain.  (Patient not taking: Reported on 09/10/2019)    . albuterol (VENTOLIN HFA) 108 (90 Base) MCG/ACT inhaler Inhale 2 puffs into the lungs every 6 (six) hours as needed for wheezing or shortness of breath. (Patient not taking: Reported on 09/10/2019) 8 g 3  . CARFILZOMIB IV Inject into the vein.  (Patient not taking: Reported on 09/10/2019)     No current facility-administered medications for this visit.   Facility-Administered Medications Ordered in Other Visits  Medication Dose Route Frequency Provider Last Rate Last Admin  . heparin lock flush 100 unit/mL  500 Units Intravenous Once Penland, Larene Beach K, MD      . sodium chloride 0.9 % injection 10 mL  10 mL  Intravenous Once Penland, Kelby Fam, MD        ALLERGIES:  Allergies  Allergen Reactions  . Diclofenac Swelling    Per  pt, facial swelling    PHYSICAL EXAM:  Performance status (ECOG): 1 - Symptomatic but completely ambulatory  There were no vitals filed for this visit. Wt Readings from Last 3 Encounters:  09/10/19 (!) 353 lb 9.6 oz (160.4 kg)  09/02/19 (!) 352 lb (159.7 kg)  08/27/19 (!) 330 lb 6.4 oz (149.9 kg)   Physical Exam Vitals reviewed.  Constitutional:      Appearance: Normal appearance. She is obese.  Cardiovascular:     Rate and Rhythm: Normal rate and regular rhythm.     Pulses: Normal pulses.     Heart sounds: Normal heart sounds.  Pulmonary:     Effort: Pulmonary effort is normal.     Breath sounds: Normal breath sounds.  Chest:     Comments: Port-a-Cath on L chest Musculoskeletal:     Right lower leg: Edema (trace) present.     Left lower leg: Edema (trace) present.  Neurological:     General: No focal deficit present.     Mental Status: She is alert and oriented to person, place, and time.  Psychiatric:        Mood and Affect: Mood normal.        Behavior: Behavior normal.     LABORATORY DATA:  I have reviewed the labs as listed.  CBC Latest Ref Rng & Units 09/10/2019 08/27/2019 08/20/2019  WBC 4.0 - 10.5 K/uL 4.0 4.6 2.7(L)  Hemoglobin 12.0 - 15.0 g/dL 10.4(L) 12.5 11.0(L)  Hematocrit 36 - 46 % 32.6(L) 38.9 34.8(L)  Platelets 150 - 400 K/uL 212 264 189   CMP Latest Ref Rng & Units 08/27/2019 08/20/2019 08/06/2019  Glucose 70 - 99 mg/dL 93 112(H) 112(H)  BUN 8 - 23 mg/dL 24(H) 16 23  Creatinine 0.44 - 1.00 mg/dL 1.56(H) 1.00 1.02(H)  Sodium 135 - 145 mmol/L 137 141 140  Potassium 3.5 - 5.1 mmol/L 3.0(L) 3.3(L) 3.6  Chloride 98 - 111 mmol/L 87(L) 102 106  CO2 22 - 32 mmol/L 36(H) 30 25  Calcium 8.9 - 10.3 mg/dL 10.1 9.6 9.2  Total Protein 6.5 - 8.1 g/dL 7.6 6.2(L) 6.2(L)  Total Bilirubin 0.3 - 1.2 mg/dL 0.8 0.9 0.7  Alkaline Phos 38 - 126 U/L 52  44 52  AST 15 - 41 U/L 19 16 22   ALT 0 - 44 U/L 17 19 36   Lab Results  Component Value Date   LDH 204 (H) 07/09/2019   LDH 277 (H) 06/04/2019   LDH 246 (H) 05/14/2019   Lab Results  Component Value Date   TOTALPROTELP 6.9 08/27/2019   ALBUMINELP 3.7 08/27/2019   A1GS 0.3 08/27/2019   A2GS 0.8 08/27/2019   BETS 1.2 08/27/2019   GAMS 0.8 08/27/2019   MSPIKE 0.2 (H) 08/27/2019   SPEI Comment 08/27/2019    Lab Results  Component Value Date   KPAFRELGTCHN 41.0 (H) 08/27/2019   LAMBDASER 26.8 (H) 08/27/2019   KAPLAMBRATIO 1.53 08/27/2019    DIAGNOSTIC IMAGING:  I have independently reviewed the scans and discussed with the patient. ECHOCARDIOGRAM COMPLETE  Result Date: 08/27/2019    ECHOCARDIOGRAM REPORT   Patient Name:   JAALA BOHLE Date of Exam: 08/27/2019 Medical Rec #:  [id number]          Height:       62.0 in Accession #:    [id number]         Weight:       330.4 lb Date of Birth:  07-02-1955  BSA:          2.366 m Patient Age:    64 years           BP:           152/80 mmHg Patient Gender: F                  HR:           90 bpm. Exam Location:  [facility] Procedure: 2D Echo, Cardiac Doppler and Color Doppler Indications:    R60.9 (ICD-10-CM) - Edema, unspecified type  History:        Patient has prior history of Echocardiogram examinations, most                 recent 02/19/2019. Risk Factors:Hypertension. DOE (dyspnea on                 exertion), Moprbid Obesity, Cancer ([place]) (From Hx).  Sonographer:    Alvino Chapel RCS Referring Phys: [phone] [place]  1. Previous echo 02/19/19 indicated mild PS however review of echo shows no stenosis and no PS on this echo suspect it was a typo.  2. Some lateral impingement on lateral aspect of LA seen previously Review of patients CT scan done 02/24/19 suggests this is tortuous aort a and esophagus.  3. Left ventricular ejection fraction, by estimation, is 60 to 65%. The left ventricle has normal function.  The left ventricle has no regional wall motion abnormalities. Left ventricular diastolic parameters were normal.  4. Right ventricular systolic function is normal. The right ventricular size is normal.  5. The mitral valve is normal in structure. No evidence of mitral valve regurgitation. No evidence of mitral stenosis.  6. Calcified non coronary cusp . The aortic valve is tricuspid. Aortic valve regurgitation is not visualized. Mild aortic valve sclerosis is present, with no evidence of aortic valve stenosis.  7. The inferior vena cava is normal in size with greater than 50% respiratory variability, suggesting right atrial pressure of 3 mmHg. FINDINGS  Left Ventricle: Left ventricular ejection fraction, by estimation, is 60 to 65%. The left ventricle has normal function. The left ventricle has no regional wall motion abnormalities. The left ventricular internal cavity size was normal in size. There is  no left ventricular hypertrophy. Left ventricular diastolic parameters were normal. Right Ventricle: The right ventricular size is normal. No increase in right ventricular wall thickness. Right ventricular systolic function is normal. Left Atrium: Left atrial size was normal in size. Right Atrium: Right atrial size was normal in size. Pericardium: There is no evidence of pericardial effusion. Mitral Valve: The mitral valve is normal in structure. There is mild thickening of the mitral valve leaflet(s). There is mild calcification of the mitral valve leaflet(s). Normal mobility of the mitral valve leaflets. Mild mitral annular calcification. No evidence of mitral valve regurgitation. No evidence of mitral valve stenosis. Tricuspid Valve: The tricuspid valve is normal in structure. Tricuspid valve regurgitation is trivial. No evidence of tricuspid stenosis. Aortic Valve: Calcified non coronary cusp. The aortic valve is tricuspid. Aortic valve regurgitation is not visualized. Mild aortic valve sclerosis is present, with  no evidence of aortic valve stenosis. Pulmonic Valve: The pulmonic valve was normal in structure. Pulmonic valve regurgitation is not visualized. No evidence of pulmonic stenosis. Aorta: The aortic root is normal in size and structure. Venous: The inferior vena cava is normal in size with greater than 50% respiratory variability, suggesting right atrial pressure of 3  mmHg. IAS/Shunts: No atrial level shunt detected by color flow Doppler. Additional Comments: Previous echo 02/19/19 indicated mild PS however review of echo shows no stenosis and no PS on this echo suspect it was a typo. Some lateral impingement on lateral aspect of LA seen previously Review of patients CT scan done 02/24/19 suggests this is tortuous aort a and esophagus.  LEFT VENTRICLE PLAX 2D LVIDd:         4.41 cm  Diastology LVIDs:         2.82 cm  LV e' lateral:   7.07 cm/s LV PW:         1.15 cm  LV E/e' lateral: 12.4 LV IVS:        1.11 cm  LV e' medial:    8.59 cm/s LVOT diam:     2.30 cm  LV E/e' medial:  10.2 LV SV:         96 LV SV Index:   40 LVOT Area:     4.15 cm  RIGHT VENTRICLE RV S prime:     14.00 cm/s TAPSE (M-mode): 2.0 cm LEFT ATRIUM             Index       RIGHT ATRIUM           Index LA diam:        3.20 cm 1.35 cm/m  RA Area:     21.00 cm LA Vol (A2C):   84.9 ml 35.88 ml/m RA Volume:   69.50 ml  29.37 ml/m LA Vol (A4C):   68.9 ml 29.12 ml/m LA Biplane Vol: 77.7 ml 32.84 ml/m  AORTIC VALVE LVOT Vmax:   137.00 cm/s LVOT Vmean:  92.000 cm/s LVOT VTI:    0.230 m  AORTA Ao Root diam: 3.10 cm MITRAL VALVE MV Area (PHT): 3.39 cm    SHUNTS MV Decel Time: 224 msec    Systemic VTI:  0.23 m MV E velocity: 87.80 cm/s  Systemic Diam: 2.30 cm MV A velocity: 72.80 cm/s MV E/A ratio:  1.21 Jenkins Rouge MD Electronically signed by Jenkins Rouge MD Signature Date/Time: 08/27/2019/4:28:41 PM    Final      ASSESSMENT:  1. Relapsed IgG kappa multiple myeloma with high risk features: -5cycles of carfilzomib, pomalidomide and dexamethasone  from 03/05/2019 through 06/25/2019. -Myeloma panel on 07/09/2019 shows M spike 0.3 g, slightly up from 0.2 g previously. However free light chain ratio has 1.37 and improved. Kappa light chains are 24.8.  2. Shortness of breath on exertion: -2D echo on 02/09/2019 shows EF 60-65%. No LVH. -Chest CT PE protocol on 02/24/2019 was negative. -Cardiac MRI on 03/07/2019 shows LVEF 56% with no amyloid. Troponin T and proBNP were negative. -PFTs show low expiratory reserve volume consistent with body habitus. Moderate diffusion defect which corrects to normal. FVC, FEV1, FEV1/FVC ratio are within normal limits. FVC is reduced relative to SVC indicates air-trapping. No significant response after bronchodilators. Reduced diffusion capacity indicates moderate loss of functional alveolar capillary space. -She was evaluated by Dr.Wert. -2D echocardiogram on 08/27/2019 shows LVEF 60 to 65%.  No LVH.  3. Myeloma bone disease: -Bone density on 07/04/2019 shows T score 1.8.   PLAN:  1. Relapsed IgG kappa multiple myeloma with high risk features: -Reviewed myeloma labs from 08/27/2019.  M spike is 0.2 g.  Kappa light chain is 40.  I have also reviewed labs from [facility] which are in the same ballpark. -We will hold S indefinitely.  We  will also continue to hold dexamethasone. -She will continue pomalidomide as long as she maintains partial response. -I plan to see her back in 1 month for follow-up with repeat myeloma labs.  2. Shortness of breath on exertion: -Continue bronchodilators and follow-up with pulmonary.  3. ID prophylaxis: -Continue acyclovir twice daily.  Continue aspirin for thromboprophylaxis.  4. Neuropathy: -Continue gabapentin 300 mg 2-3 times a day.  5. Lower extremity edema: -Continue Bumex 2 mg daily. -Continue metolazone 2.5 mg Monday, Wednesday and Friday.  6. Myeloma bone disease: -Creatinine today is 1.23.  She will receive Zometa.  7.   Hypokalemia: -Continue potassium supplements daily.   Orders placed this encounter:  No orders of the defined types were placed in this encounter.    Derek Jack, MD [place] [phone]   I, Milinda Antis, am acting as a scribe for Dr. Sanda Linger.  I, Derek Jack MD, have reviewed the above documentation for accuracy and completeness, and I agree with the above.

## 2019-09-10 NOTE — Patient Instructions (Addendum)
[place] at [facility] Discharge Instructions  You were seen today by Dr. Delton Coombes. He went over your recent results. You received your injection today. Dr. Delton Coombes will see you back in 4 weeks for labs and follow up.   Thank you for choosing [place] at [facility] to provide your oncology and hematology care.  To afford each patient quality time with our provider, please arrive at least 15 minutes before your scheduled appointment time.   If you have a lab appointment with the [facility] please come in thru the Main Entrance and check in at the main information desk  You need to re-schedule your appointment should you arrive 10 or more minutes late.  We strive to give you quality time with our providers, and arriving late affects you and other patients whose appointments are after yours.  Also, if you no show three or more times for appointments you may be dismissed from the clinic at the providers discretion.     Again, thank you for choosing [facility].  Our hope is that these requests will decrease the amount of time that you wait before being seen by our physicians.       _____________________________________________________________  Should you have questions after your visit to [facility], please contact our office at [phone] between the hours of 8:00 a.m. and 4:30 p.m.  Voicemails left after 4:00 p.m. will not be returned until the following business day.  For prescription refill requests, have your pharmacy contact our office and allow 72 hours.    Cancer Center Support Programs:   > Cancer Support Group  2nd Tuesday of the month 1pm-2pm, Journey Room

## 2019-09-10 NOTE — Patient Instructions (Signed)
[facility] at [facility] Discharge Instructions  Received Zometa infusion today. Follow-up as scheduled   Thank you for choosing [facility] at [facility] to provide your oncology and hematology care.  To afford each patient quality time with our provider, please arrive at least 15 minutes before your scheduled appointment time.   If you have a lab appointment with the Cancer Center please come in thru the Main Entrance and check in at the main information desk.  You need to re-schedule your appointment should you arrive 10 or more minutes late.  We strive to give you quality time with our providers, and arriving late affects you and other patients whose appointments are after yours.  Also, if you no show three or more times for appointments you may be dismissed from the clinic at the providers discretion.     Again, thank you for choosing [facility].  Our hope is that these requests will decrease the amount of time that you wait before being seen by our physicians.       _____________________________________________________________  Should you have questions after your visit to  Cancer Center, please contact our office at [phone] and follow the prompts.  Our office hours are 8:00 a.m. and 4:30 p.m. Monday - Friday.  Please note that voicemails left after 4:00 p.m. may not be returned until the following business day.  We are closed weekends and major holidays.  You do have access to a nurse 24-7, just call the main number to the clinic [phone] and do not press any options, hold on the line and a nurse will answer the phone.    For prescription refill requests, have your pharmacy contact our office and allow 72 hours.    Due to Covid, you will need to wear a mask upon entering the hospital. If you do not have a mask, a mask will be given to you at the Main Entrance upon arrival. For doctor visits, patients may have 1  support person age 18 or older with them. For treatment visits, patients can not have anyone with them due to social distancing guidelines and our immunocompromised population.     

## 2019-09-15 ENCOUNTER — Telehealth: Payer: Self-pay | Admitting: *Deleted

## 2019-09-15 NOTE — Telephone Encounter (Signed)
LMOVM to schedule TCS with propofol with Dr. Abbey Chatters

## 2019-09-16 NOTE — Telephone Encounter (Signed)
Letter mailed

## 2019-09-28 ENCOUNTER — Other Ambulatory Visit (HOSPITAL_COMMUNITY): Payer: Self-pay | Admitting: Hematology

## 2019-09-28 DIAGNOSIS — C9000 Multiple myeloma not having achieved remission: Secondary | ICD-10-CM

## 2019-09-29 ENCOUNTER — Other Ambulatory Visit (HOSPITAL_COMMUNITY): Payer: Self-pay | Admitting: *Deleted

## 2019-09-29 DIAGNOSIS — C9000 Multiple myeloma not having achieved remission: Secondary | ICD-10-CM

## 2019-09-29 MED ORDER — ACYCLOVIR 400 MG PO TABS: 400.0000 mg | ORAL_TABLET | Freq: Two times a day (BID) | ORAL | 6 refills | Status: DC

## 2019-09-29 MED ORDER — BUMETANIDE 1 MG PO TABS
2.0000 mg | ORAL_TABLET | Freq: Every day | ORAL | 3 refills | Status: DC
Start: 2019-09-29 — End: 2019-11-25

## 2019-10-01 ENCOUNTER — Other Ambulatory Visit (HOSPITAL_COMMUNITY): Payer: Self-pay | Admitting: *Deleted

## 2019-10-01 MED ORDER — POMALIDOMIDE 4 MG PO CAPS: 4.0000 mg | ORAL_CAPSULE | Freq: Every day | ORAL | 0 refills | Status: DC

## 2019-10-06 ENCOUNTER — Other Ambulatory Visit (HOSPITAL_COMMUNITY): Payer: Self-pay | Admitting: Hematology

## 2019-10-06 DIAGNOSIS — C9000 Multiple myeloma not having achieved remission: Secondary | ICD-10-CM

## 2019-10-07 ENCOUNTER — Telehealth: Payer: Self-pay | Admitting: Internal Medicine

## 2019-10-07 NOTE — Telephone Encounter (Signed)
Called pt, TCS w/Prop w/Dr. Abbey Chatters scheduled for 11/25/19 at 10:45am. Orders entered.

## 2019-10-07 NOTE — Telephone Encounter (Signed)
Pt received letter that we were trying to reach her to schedule colonoscopy. Please call [phone]

## 2019-10-08 ENCOUNTER — Other Ambulatory Visit: Payer: Self-pay

## 2019-10-08 ENCOUNTER — Inpatient Hospital Stay (HOSPITAL_COMMUNITY): Payer: Medicare Other

## 2019-10-08 ENCOUNTER — Other Ambulatory Visit (HOSPITAL_COMMUNITY): Payer: Self-pay | Admitting: *Deleted

## 2019-10-08 ENCOUNTER — Inpatient Hospital Stay (HOSPITAL_COMMUNITY): Payer: Medicare Other | Attending: Hematology | Admitting: Hematology

## 2019-10-08 VITALS — BP 104/55 | HR 73 | Temp 97.0°F | Resp 20 | Wt 333.4 lb

## 2019-10-08 DIAGNOSIS — Z8042 Family history of malignant neoplasm of prostate: Secondary | ICD-10-CM | POA: Diagnosis not present

## 2019-10-08 DIAGNOSIS — R7989 Other specified abnormal findings of blood chemistry: Secondary | ICD-10-CM | POA: Diagnosis not present

## 2019-10-08 DIAGNOSIS — Z8719 Personal history of other diseases of the digestive system: Secondary | ICD-10-CM | POA: Diagnosis not present

## 2019-10-08 DIAGNOSIS — R600 Localized edema: Secondary | ICD-10-CM | POA: Diagnosis not present

## 2019-10-08 DIAGNOSIS — R5383 Other fatigue: Secondary | ICD-10-CM | POA: Insufficient documentation

## 2019-10-08 DIAGNOSIS — M7989 Other specified soft tissue disorders: Secondary | ICD-10-CM | POA: Insufficient documentation

## 2019-10-08 DIAGNOSIS — Z8249 Family history of ischemic heart disease and other diseases of the circulatory system: Secondary | ICD-10-CM | POA: Insufficient documentation

## 2019-10-08 DIAGNOSIS — E876 Hypokalemia: Secondary | ICD-10-CM | POA: Insufficient documentation

## 2019-10-08 DIAGNOSIS — C9000 Multiple myeloma not having achieved remission: Secondary | ICD-10-CM

## 2019-10-08 DIAGNOSIS — E669 Obesity, unspecified: Secondary | ICD-10-CM | POA: Insufficient documentation

## 2019-10-08 DIAGNOSIS — Z809 Family history of malignant neoplasm, unspecified: Secondary | ICD-10-CM | POA: Diagnosis not present

## 2019-10-08 DIAGNOSIS — M2550 Pain in unspecified joint: Secondary | ICD-10-CM | POA: Diagnosis not present

## 2019-10-08 DIAGNOSIS — R0602 Shortness of breath: Secondary | ICD-10-CM | POA: Diagnosis not present

## 2019-10-08 DIAGNOSIS — Z79899 Other long term (current) drug therapy: Secondary | ICD-10-CM | POA: Insufficient documentation

## 2019-10-08 DIAGNOSIS — G629 Polyneuropathy, unspecified: Secondary | ICD-10-CM | POA: Insufficient documentation

## 2019-10-08 LAB — CBC WITH DIFFERENTIAL/PLATELET
Abs Immature Granulocytes: 0.03 10*3/uL (ref 0.00–0.07)
Basophils Absolute: 0.1 10*3/uL (ref 0.0–0.1)
Basophils Relative: 2 %
Eosinophils Absolute: 0.3 10*3/uL (ref 0.0–0.5)
Eosinophils Relative: 10 %
HCT: 34.3 % — ABNORMAL LOW (ref 36.0–46.0)
Hemoglobin: 10.9 g/dL — ABNORMAL LOW (ref 12.0–15.0)
Immature Granulocytes: 1 %
Lymphocytes Relative: 25 %
Lymphs Abs: 0.8 10*3/uL (ref 0.7–4.0)
MCH: 30.4 pg (ref 26.0–34.0)
MCHC: 31.8 g/dL (ref 30.0–36.0)
MCV: 95.5 fL (ref 80.0–100.0)
Monocytes Absolute: 0.4 10*3/uL (ref 0.1–1.0)
Monocytes Relative: 12 %
Neutro Abs: 1.6 10*3/uL — ABNORMAL LOW (ref 1.7–7.7)
Neutrophils Relative %: 50 %
Platelets: 248 10*3/uL (ref 150–400)
RBC: 3.59 MIL/uL — ABNORMAL LOW (ref 3.87–5.11)
RDW: 14 % (ref 11.5–15.5)
WBC: 3.2 10*3/uL — ABNORMAL LOW (ref 4.0–10.5)
nRBC: 0 % (ref 0.0–0.2)

## 2019-10-08 LAB — COMPREHENSIVE METABOLIC PANEL
ALT: 13 U/L (ref 0–44)
AST: 17 U/L (ref 15–41)
Albumin: 3.9 g/dL (ref 3.5–5.0)
Alkaline Phosphatase: 44 U/L (ref 38–126)
Anion gap: 12 (ref 5–15)
BUN: 32 mg/dL — ABNORMAL HIGH (ref 8–23)
CO2: 30 mmol/L (ref 22–32)
Calcium: 10.2 mg/dL (ref 8.9–10.3)
Chloride: 93 mmol/L — ABNORMAL LOW (ref 98–111)
Creatinine, Ser: 1.7 mg/dL — ABNORMAL HIGH (ref 0.44–1.00)
GFR calc Af Amer: 37 mL/min — ABNORMAL LOW (ref >60)
GFR calc non Af Amer: 32 mL/min — ABNORMAL LOW (ref >60)
Glucose, Bld: 98 mg/dL (ref 70–99)
Potassium: 3.3 mmol/L — ABNORMAL LOW (ref 3.5–5.1)
Sodium: 135 mmol/L (ref 135–145)
Total Bilirubin: 0.8 mg/dL (ref 0.3–1.2)
Total Protein: 7.8 g/dL (ref 6.5–8.1)

## 2019-10-08 MED ORDER — SODIUM CHLORIDE 0.9 % IV SOLN: Freq: Once | INTRAVENOUS | Status: AC

## 2019-10-08 MED ORDER — SODIUM CHLORIDE 0.9% FLUSH
10.0000 mL | Freq: Once | INTRAVENOUS | Status: AC
  Administered 2019-10-08: 10 mL via INTRAVENOUS

## 2019-10-08 MED ORDER — HEPARIN SOD (PORK) LOCK FLUSH 100 UNIT/ML IV SOLN
500.0000 [IU] | Freq: Once | INTRAVENOUS | Status: AC
  Administered 2019-10-08: 500 [IU] via INTRAVENOUS

## 2019-10-08 MED ORDER — POTASSIUM CHLORIDE CRYS ER 20 MEQ PO TBCR: EXTENDED_RELEASE_TABLET | ORAL | 0 refills | Status: DC

## 2019-10-08 NOTE — Patient Instructions (Addendum)
[place] at [facility] Discharge Instructions  You were seen today by Dr. Delton Coombes. He went over your recent results. You received your injection today; continue getting your monthly injections. Stop taking the metolazone and take only the bumetanide. Dr. Delton Coombes will see you back in 8 weeks for labs and follow up.   Thank you for choosing [place] at [facility] to provide your oncology and hematology care.  To afford each patient quality time with our provider, please arrive at least 15 minutes before your scheduled appointment time.   If you have a lab appointment with the [facility] please come in thru the Main Entrance and check in at the main information desk  You need to re-schedule your appointment should you arrive 10 or more minutes late.  We strive to give you quality time with our providers, and arriving late affects you and other patients whose appointments are after yours.  Also, if you no show three or more times for appointments you may be dismissed from the clinic at the providers discretion.     Again, thank you for choosing [facility]  Our hope is that these requests will decrease the amount of time that you wait before being seen by our physicians.       _____________________________________________________________  Should you have questions after your visit to [facility], please contact our office at [phone] between the hours of 8:00 a.m. and 4:30 p.m.  Voicemails left after 4:00 p.m. will not be returned until the following business day.  For prescription refill requests, have your pharmacy contact our office and allow 72 hours.    Cancer Center Support Programs:   > Cancer Support Group  2nd Tuesday of the month 1pm-2pm, Journey Room

## 2019-10-08 NOTE — Progress Notes (Signed)
Zometa being held due to kidney function today.  500 mL bolus of NS over 1 hr. Pt told to stop taking bumex but continue taking lasix.  Continue taking potassium as prescribed, K 3.3.  Tolerated fluids.  Discharged via wheelchair.  Vital signs stable prior to discharge.

## 2019-10-08 NOTE — Patient Instructions (Signed)
[place] at [facility]  Discharge Instructions:  Zometa being held today due to kidney function.  500 mL of NS given over hour today.  Continue taking lasix but stop taking bumex. Please continue taking potassium as prescribed.  _______________________________________________________________  Thank you for choosing [place] at [facility] to provide your oncology and hematology care.  To afford each patient quality time with our providers, please arrive at least 15 minutes before your scheduled appointment.  You need to re-schedule your appointment if you arrive 10 or more minutes late.  We strive to give you quality time with our providers, and arriving late affects you and other patients whose appointments are after yours.  Also, if you no show three or more times for appointments you may be dismissed from the clinic.  Again, thank you for choosing [place] at [facility] hope is that these requests will allow you access to exceptional care and in a timely manner. _______________________________________________________________  If you have questions after your visit, please contact our office at [phone] between the hours of 8:30 a.m. and 5:00 p.m. Voicemails left after 4:30 p.m. will not be returned until the following business day. _______________________________________________________________  For prescription refill requests, have your pharmacy contact our office. _______________________________________________________________  Recommendations made by the consultant and any test results will be sent to your referring physician. _______________________________________________________________

## 2019-10-08 NOTE — Patient Instructions (Signed)
[facility] at [facility] Discharge Instructions  Labs drawn from portacath today   Thank you for choosing [facility] at [facility] to provide your oncology and hematology care.  To afford each patient quality time with our provider, please arrive at least 15 minutes before your scheduled appointment time.   If you have a lab appointment with the Cancer Center please come in thru the Main Entrance and check in at the main information desk.  You need to re-schedule your appointment should you arrive 10 or more minutes late.  We strive to give you quality time with our providers, and arriving late affects you and other patients whose appointments are after yours.  Also, if you no show three or more times for appointments you may be dismissed from the clinic at the providers discretion.     Again, thank you for choosing [facility].  Our hope is that these requests will decrease the amount of time that you wait before being seen by our physicians.       _____________________________________________________________  Should you have questions after your visit to [facility], please contact our office at [phone] and follow the prompts.  Our office hours are 8:00 a.m. and 4:30 p.m. Monday - Friday.  Please note that voicemails left after 4:00 p.m. may not be returned until the following business day.  We are closed weekends and major holidays.  You do have access to a nurse 24-7, just call the main number to the clinic [phone] and do not press any options, hold on the line and a nurse will answer the phone.    For prescription refill requests, have your pharmacy contact our office and allow 72 hours.    Due to Covid, you will need to wear a mask upon entering the hospital. If you do not have a mask, a mask will be given to you at the Main Entrance upon arrival. For doctor visits, patients may have 1 support person age 18  or older with them. For treatment visits, patients can not have anyone with them due to social distancing guidelines and our immunocompromised population.     

## 2019-10-08 NOTE — Progress Notes (Signed)
[place] [address] [place] [address]   CLINIC:  Medical Oncology/Hematology  PCP:  Alfonse Flavors, MD [address] [place] / [place] [address]  [phone]  REASON FOR VISIT:  Follow-up for multiple myeloma  PRIOR THERAPY:  1. Bortezomib x 8 cycles from 10/05/2014 to 04/06/2016. 2. Carfilzomib x 6 cycles from 03/05/2019 to 08/06/2019.  CURRENT THERAPY: Pomalyst daily  INTERVAL HISTORY:  [name], a 64 y.o. female, returns for routine follow-up for her multiple myeloma. Kelli Hurley was last seen on 09/10/2019.  Today she reports feeling well. Her swelling in her legs has improved with taking Lasix M/W/F and is tolerating the Pomalyst well; she has 2 more weeks of tablets to take. Her SOB is improving. She denies any new aches or pains, hematochezia or hematuria. Her numbness is stable. Her appetite is good and she denies N/V/D or constipation. She denies having jaw pain.   REVIEW OF SYSTEMS:  Review of Systems  Constitutional: Positive for fatigue (mild). Negative for appetite change.  Respiratory: Positive for shortness of breath.   Gastrointestinal: Negative for blood in stool, constipation, diarrhea, nausea and vomiting.  Genitourinary: Negative for hematuria.   Musculoskeletal: Positive for arthralgias (8/10 knee pain).  Neurological: Positive for numbness (R hand).  All other systems reviewed and are negative.   PAST MEDICAL/SURGICAL HISTORY:  Past Medical History:  Diagnosis Date   Anemia    Arthritis    Cancer ([place])    Claustrophobia 10/05/2014   Hypertension    Leukopenia 08/03/2014   Normocytic hypochromic anemia 08/03/2014   Renal disorder    stage 3    Past Surgical History:  Procedure Laterality Date   ABDOMINAL HYSTERECTOMY     CARDIAC SURGERY     47 months old. States she had a leaky valve.    COLONOSCOPY  03/26/2009   Dr. Oneida Alar; normal colon, small internal hemorrhoids.  Recommended repeat  colonoscopy in 10 years.   OTHER SURGICAL HISTORY     heart surgery as infant to "repair hole in heart"   PORTACATH PLACEMENT Left 02/21/2019   Procedure: INSERTION PORT-A-CATH;  Surgeon: Virl Cagey, MD;  Location: [facility];  Service: General;  Laterality: Left;    SOCIAL HISTORY:  Social History   Socioeconomic History   Marital status: Legally Separated    Spouse name: Not on file   Number of children: Not on file   Years of education: Not on file   Highest education level: Not on file  Occupational History   Not on file  Tobacco Use   Smoking status: Never Smoker   Smokeless tobacco: Never Used  Vaping Use   Vaping Use: Never used  Substance and Sexual Activity   Alcohol use: No   Drug use: No   Sexual activity: Not on file    Comment: divorced- 2 daughters  Other Topics Concern   Not on file  Social History Narrative   Not on file   Social Determinants of Health   Financial Resource Strain:    Difficulty of Paying Living Expenses: Not on file  Food Insecurity:    Worried About Charity fundraiser in the Last Year: Not on file   [employer] of Food in the Last Year: Not on file  Transportation Needs:    Lack of Transportation (Medical): Not on file   Lack of Transportation (Non-Medical): Not on file  Physical Activity:    Days of Exercise  per Week: Not on file   Minutes of Exercise per Session: Not on file  Stress:    Feeling of Stress : Not on file  Social Connections:    Frequency of Communication with Friends and Family: Not on file   Frequency of Social Gatherings with Friends and Family: Not on file   Attends Religious Services: Not on file   Active Member of Clubs or Organizations: Not on file   Attends Archivist Meetings: Not on file   Marital Status: Not on file  Intimate Partner Violence:    Fear of Current or Ex-Partner: Not on file   Emotionally Abused: Not on file   Physically Abused: Not on file    Sexually Abused: Not on file    FAMILY HISTORY:  Family History  Problem Relation Age of Onset   Cancer Mother    Hypertension Mother    Cancer Father    Hypertension Father    Cancer Maternal Grandmother    Hypertension Sister    Hypertension Brother    Prostate cancer Brother    Colon cancer Neg Hx    Colon polyps Neg Hx     CURRENT MEDICATIONS:  Current Outpatient Medications  Medication Sig Dispense Refill   acetaminophen (TYLENOL 8 HOUR ARTHRITIS PAIN) 650 MG CR tablet Take 650 mg by mouth every 8 (eight) hours as needed for pain.      acyclovir (ZOVIRAX) 400 MG tablet Take 1 tablet (400 mg total) by mouth 2 (two) times daily. 60 tablet 6   albuterol (VENTOLIN HFA) 108 (90 Base) MCG/ACT inhaler Inhale 2 puffs into the lungs every 6 (six) hours as needed for wheezing or shortness of breath. 8 g 3   Ascorbic Acid (VITAMIN C) 1000 MG tablet Take 1,000 mg by mouth daily.      aspirin EC 81 MG tablet Take 81 mg by mouth daily.     bumetanide (BUMEX) 1 MG tablet Take 2 tablets (2 mg total) by mouth daily. 60 tablet 3   calcium carbonate (OS-CAL) 1250 (500 Ca) MG chewable tablet Chew 1 tablet by mouth daily.      CARFILZOMIB IV Inject into the vein.      cholecalciferol (VITAMIN D) 1000 units tablet Take 1,000 Units by mouth daily.     dexamethasone (DECADRON) 4 MG tablet Take 10 tablets (40 mg total) by mouth once a week. On the week you are off of treatment 10 tablet 3   gabapentin (NEURONTIN) 300 MG capsule Take 1 capsule (300 mg total) by mouth 2 (two) times daily. 30 capsule 2   lidocaine-prilocaine (EMLA) cream Apply a quarter sized glob 1 hour prior to treatment. 30 g 5   magnesium oxide (MAG-OX) 400 (241.3 Mg) MG tablet TAKE 1 TABLET BY MOUTH TWICE DAILY 60 tablet 6   metolazone (ZAROXOLYN) 2.5 MG tablet Take 1 tablet (2.5 mg total) by mouth daily. 30 tablet 0   Multiple Vitamin (TAB-A-VITE) TABS TAKE 1 TABLET BY MOUTH ONCE DAILY. (Patient taking  differently: Take 1 tablet by mouth daily. ) 30 tablet 11   pomalidomide (POMALYST) 4 MG capsule Take 1 capsule (4 mg total) by mouth daily. 21 capsule 0   potassium chloride SA (KLOR-CON) 20 MEQ tablet TAKE 1 TABLET BY MOUTH ONCE DAILY. 30 tablet 0   trolamine salicylate (ASPERCREME) 10 % cream Apply 1 application topically 2 (two) times daily.     No current facility-administered medications for this visit.   Facility-Administered Medications Ordered in Other  Visits  Medication Dose Route Frequency Provider Last Rate Last Admin   heparin lock flush 100 unit/mL  500 Units Intravenous Once Penland, Kelby Fam, MD       sodium chloride 0.9 % injection 10 mL  10 mL Intravenous Once Penland, Kelby Fam, MD        ALLERGIES:  Allergies  Allergen Reactions   Diclofenac Swelling    Per pt, facial swelling    PHYSICAL EXAM:  Performance status (ECOG): 1 - Symptomatic but completely ambulatory  Vitals:   10/08/19 1251  BP: (!) 104/55  Pulse: 73  Resp: 20  Temp: (!) 97 F (36.1 C)  SpO2: 96%   Wt Readings from Last 3 Encounters:  10/08/19 (!) 333 lb 6.4 oz (151.2 kg)  09/10/19 (!) 353 lb 9.6 oz (160.4 kg)  09/02/19 (!) 352 lb (159.7 kg)   Physical Exam Vitals reviewed.  Constitutional:      Appearance: Normal appearance. She is obese.  Cardiovascular:     Rate and Rhythm: Normal rate and regular rhythm.     Pulses: Normal pulses.     Heart sounds: Normal heart sounds.  Pulmonary:     Effort: Pulmonary effort is normal.     Breath sounds: Normal breath sounds.  Chest:     Comments: Port-a-Cath in L chest Musculoskeletal:     Right lower leg: No edema.     Left lower leg: No edema.  Neurological:     General: No focal deficit present.     Mental Status: She is alert and oriented to person, place, and time.  Psychiatric:        Mood and Affect: Mood normal.        Behavior: Behavior normal.     LABORATORY DATA:  I have reviewed the labs as listed.  CBC Latest  Ref Rng & Units 10/08/2019 09/10/2019 08/27/2019  WBC 4.0 - 10.5 K/uL 3.2(L) 4.0 4.6  Hemoglobin 12.0 - 15.0 g/dL 10.9(L) 10.4(L) 12.5  Hematocrit 36 - 46 % 34.3(L) 32.6(L) 38.9  Platelets 150 - 400 K/uL 248 212 264   CMP Latest Ref Rng & Units 10/08/2019 09/10/2019 08/27/2019  Glucose 70 - 99 mg/dL 98 115(H) 93  BUN 8 - 23 mg/dL 32(H) 21 24(H)  Creatinine 0.44 - 1.00 mg/dL 1.70(H) 1.23(H) 1.56(H)  Sodium 135 - 145 mmol/L 135 137 137  Potassium 3.5 - 5.1 mmol/L 3.3(L) 3.9 3.0(L)  Chloride 98 - 111 mmol/L 93(L) 97(L) 87(L)  CO2 22 - 32 mmol/L 30 29 36(H)  Calcium 8.9 - 10.3 mg/dL 10.2 9.8 10.1  Total Protein 6.5 - 8.1 g/dL 7.8 7.1 7.6  Total Bilirubin 0.3 - 1.2 mg/dL 0.8 0.6 0.8  Alkaline Phos 38 - 126 U/L 44 49 52  AST 15 - 41 U/L _0 ALT 0 - 44 U/L _1 Component Value Date/Time   RBC 3.59 (L) 10/08/2019 1226   MCV 95.5 10/08/2019 1226   MCH 30.4 10/08/2019 1226   MCHC 31.8 10/08/2019 1226   RDW 14.0 10/08/2019 1226   LYMPHSABS 0.8 10/08/2019 1226   MONOABS 0.4 10/08/2019 1226   EOSABS 0.3 10/08/2019 1226   BASOSABS 0.1 10/08/2019 1226    DIAGNOSTIC IMAGING:  I have independently reviewed the scans and discussed with the patient. No results found.   ASSESSMENT:  1. Relapsed IgG kappa multiple myeloma with high risk features: -5cycles of carfilzomib, pomalidomide and dexamethasone from 03/05/2019 through 06/25/2019. -Myeloma panel on 07/09/2019  shows M spike 0.3 g, slightly up from 0.2 g previously. However free light chain ratio has 1.37 and improved. Kappa light chains are 24.8.  2. Shortness of breath on exertion: -2D echo on 02/09/2019 shows EF 60-65%. No LVH. -Chest CT PE protocol on 02/24/2019 was negative. -Cardiac MRI on 03/07/2019 shows LVEF 56% with no amyloid. Troponin T and proBNP were negative. -PFTs show low expiratory reserve volume consistent with body habitus. Moderate diffusion defect which corrects to normal. FVC, FEV1, FEV1/FVC ratio are  within normal limits. FVC is reduced relative to SVC indicates air-trapping. No significant response after bronchodilators. Reduced diffusion capacity indicates moderate loss of functional alveolar capillary space. -She was evaluated by Dr.Wert. -2D echocardiogram on 08/27/2019 shows LVEF 60 to 65%. No LVH.  3. Myeloma bone disease: -Bone density on 07/04/2019 shows T score 1.8.   PLAN:  1. Relapsed IgG kappa multiple myeloma with high risk features: -Reviewed myeloma labs from 08/27/2019 which has shown partial response. -She is tolerating pomalidomide and dexamethasone very well. -Reviewed labs from today which showed normal LFTs.  Calcium is 10.2.  Creatinine has gone up to 1.7. -She will continue pomalidomide and dexamethasone at this time.  I think the elevated creatinine is secondary to metolazone which we will discontinue. -I will see her back in 8 weeks for follow-up.  She will continue pomalidomide and dexamethasone.  2. Shortness of breath on exertion: -Continue bronchodilators.  This has improved with diuresis.  3. ID prophylaxis: -Continue acyclovir twice daily.  Continue aspirin for thromboprophylaxis.  4. Neuropathy: -Continue gabapentin 300 mg 2-3 times a day.  5. Lower extremity edema: -Continue Bumex 2 mg daily.  Discontinue metolazone, which she was taking 3 times a week. -Leg swelling have completely improved.  6. Myeloma bone disease: -Creatinine today is 1.70.  We will hold her Zometa.  7. Hypokalemia: -Continue potassium supplements daily.  Orders placed this encounter:  Orders Placed This Encounter  Procedures   Kappa/lambda light chains   Protein electrophoresis, serum     Derek Jack, MD [place] [phone]   I, Milinda Antis, am acting as a scribe for Dr. Sanda Linger.  I, Derek Jack MD, have reviewed the above documentation for accuracy and completeness, and I agree with the  above.

## 2019-10-08 NOTE — Telephone Encounter (Signed)
Pre-op/covid test 11/24/19 at 9:30am. Appt letter mailed with procedure instructions.

## 2019-10-08 NOTE — Progress Notes (Signed)
Patient was assessed by Dr. Delton Coombes and labs have been reviewed.  Creatinine and BUN elevated.  Patient will NOT get treatment today.  She is to get Normal Saline 500 ml over 1 hour today.  We will postpone her zometa for 4 weeks.  Patient's potassium 3.3, patient has just started taking potassium at home per her physician at [facility].  Patient will hold her Bumex and only take lasix.  Primary RN and pharmacy aware.

## 2019-10-09 ENCOUNTER — Other Ambulatory Visit (HOSPITAL_COMMUNITY): Payer: Self-pay | Admitting: Hematology

## 2019-10-09 LAB — KAPPA/LAMBDA LIGHT CHAINS
Kappa free light chain: 84.5 mg/L — ABNORMAL HIGH (ref 3.3–19.4)
Kappa, lambda light chain ratio: 1.78 — ABNORMAL HIGH (ref 0.26–1.65)
Lambda free light chains: 47.5 mg/L — ABNORMAL HIGH (ref 5.7–26.3)

## 2019-10-09 LAB — PROTEIN ELECTROPHORESIS, SERUM
A/G Ratio: 1.1 (ref 0.7–1.7)
Albumin ELP: 3.6 g/dL (ref 2.9–4.4)
Alpha-1-Globulin: 0.3 g/dL (ref 0.0–0.4)
Alpha-2-Globulin: 0.7 g/dL (ref 0.4–1.0)
Beta Globulin: 1.1 g/dL (ref 0.7–1.3)
Gamma Globulin: 1.2 g/dL (ref 0.4–1.8)
Globulin, Total: 3.2 g/dL (ref 2.2–3.9)
M-Spike, %: 0.3 g/dL — ABNORMAL HIGH
Total Protein ELP: 6.8 g/dL (ref 6.0–8.5)

## 2019-10-25 ENCOUNTER — Other Ambulatory Visit (HOSPITAL_COMMUNITY): Payer: Self-pay | Admitting: Hematology

## 2019-10-27 ENCOUNTER — Other Ambulatory Visit (HOSPITAL_COMMUNITY): Payer: Self-pay

## 2019-10-27 MED ORDER — POMALIDOMIDE 4 MG PO CAPS: 4.0000 mg | ORAL_CAPSULE | Freq: Every day | ORAL | 0 refills | Status: DC

## 2019-10-27 NOTE — Telephone Encounter (Signed)
Chart reviewed. Pomalyst refilled per Dr. Katragadda 

## 2019-11-03 ENCOUNTER — Other Ambulatory Visit (HOSPITAL_COMMUNITY): Payer: Self-pay | Admitting: Hematology

## 2019-11-03 DIAGNOSIS — C9000 Multiple myeloma not having achieved remission: Secondary | ICD-10-CM

## 2019-11-05 ENCOUNTER — Inpatient Hospital Stay (HOSPITAL_COMMUNITY): Payer: Medicare Other

## 2019-11-05 ENCOUNTER — Other Ambulatory Visit: Payer: Self-pay

## 2019-11-05 ENCOUNTER — Encounter (HOSPITAL_COMMUNITY): Payer: Self-pay

## 2019-11-05 ENCOUNTER — Other Ambulatory Visit (HOSPITAL_COMMUNITY): Payer: Self-pay | Admitting: Hematology

## 2019-11-05 DIAGNOSIS — C9000 Multiple myeloma not having achieved remission: Secondary | ICD-10-CM | POA: Diagnosis not present

## 2019-11-05 DIAGNOSIS — C9001 Multiple myeloma in remission: Secondary | ICD-10-CM

## 2019-11-05 LAB — COMPREHENSIVE METABOLIC PANEL
ALT: 15 U/L (ref 0–44)
AST: 19 U/L (ref 15–41)
Albumin: 3.6 g/dL (ref 3.5–5.0)
Alkaline Phosphatase: 42 U/L (ref 38–126)
Anion gap: 10 (ref 5–15)
BUN: 25 mg/dL — ABNORMAL HIGH (ref 8–23)
CO2: 27 mmol/L (ref 22–32)
Calcium: 9 mg/dL (ref 8.9–10.3)
Chloride: 100 mmol/L (ref 98–111)
Creatinine, Ser: 1.43 mg/dL — ABNORMAL HIGH (ref 0.44–1.00)
GFR calc Af Amer: 45 mL/min — ABNORMAL LOW (ref >60)
GFR calc non Af Amer: 39 mL/min — ABNORMAL LOW (ref >60)
Glucose, Bld: 130 mg/dL — ABNORMAL HIGH (ref 70–99)
Potassium: 3.3 mmol/L — ABNORMAL LOW (ref 3.5–5.1)
Sodium: 137 mmol/L (ref 135–145)
Total Bilirubin: 0.5 mg/dL (ref 0.3–1.2)
Total Protein: 6.9 g/dL (ref 6.5–8.1)

## 2019-11-05 MED ORDER — SODIUM CHLORIDE 0.9 % IV SOLN: Freq: Once | INTRAVENOUS | Status: AC

## 2019-11-05 MED ORDER — HEPARIN SOD (PORK) LOCK FLUSH 100 UNIT/ML IV SOLN
500.0000 [IU] | Freq: Once | INTRAVENOUS | Status: AC
  Administered 2019-11-05: 500 [IU] via INTRAVENOUS

## 2019-11-05 MED ORDER — HYDROCODONE-ACETAMINOPHEN 5-325 MG PO TABS
1.0000 | ORAL_TABLET | Freq: Two times a day (BID) | ORAL | 0 refills | Status: DC | PRN
Start: 2019-11-05 — End: 2020-01-15

## 2019-11-05 MED ORDER — SODIUM CHLORIDE 0.9% FLUSH
10.0000 mL | Freq: Once | INTRAVENOUS | Status: AC
  Administered 2019-11-05: 10 mL via INTRAVENOUS

## 2019-11-05 MED ORDER — ZOLEDRONIC ACID 4 MG/100ML IV SOLN
4.0000 mg | Freq: Once | INTRAVENOUS | Status: AC
  Administered 2019-11-05: 4 mg via INTRAVENOUS
  Filled 2019-11-05: qty 100

## 2019-11-05 NOTE — Patient Instructions (Signed)
[facility] at [facility]  Discharge Instructions:   _______________________________________________________________  Thank you for choosing [facility] at [facility] to provide your oncology and hematology care.  To afford each patient quality time with our providers, please arrive at least 15 minutes before your scheduled appointment.  You need to re-schedule your appointment if you arrive 10 or more minutes late.  We strive to give you quality time with our providers, and arriving late affects you and other patients whose appointments are after yours.  Also, if you no show three or more times for appointments you may be dismissed from the clinic.  Again, thank you for choosing [facility] at [facility]. Our hope is that these requests will allow you access to exceptional care and in a timely manner. _______________________________________________________________  If you have questions after your visit, please contact our office at [phone] between the hours of 8:30 a.m. and 5:00 p.m. Voicemails left after 4:30 p.m. will not be returned until the following business day. _______________________________________________________________  For prescription refill requests, have your pharmacy contact our office. _______________________________________________________________  Recommendations made by the consultant and any test results will be sent to your referring physician. _______________________________________________________________ 

## 2019-11-05 NOTE — Progress Notes (Signed)
Treatment given per orders. Patient tolerated it well without problems. Vitals stable and discharged home from clinic via wheelchair in stable condition.  Follow up as scheduled.  

## 2019-11-10 ENCOUNTER — Other Ambulatory Visit (HOSPITAL_COMMUNITY): Payer: Self-pay | Admitting: Hematology

## 2019-11-19 ENCOUNTER — Other Ambulatory Visit (HOSPITAL_COMMUNITY): Payer: Self-pay

## 2019-11-19 MED ORDER — POMALIDOMIDE 4 MG PO CAPS: 4.0000 mg | ORAL_CAPSULE | Freq: Every day | ORAL | 0 refills | Status: DC

## 2019-11-19 NOTE — Telephone Encounter (Signed)
Chart reviewed. Pomalyst refilled per Dr. Katragadda 

## 2019-11-21 NOTE — Patient Instructions (Signed)
MAIDIE STREIGHT  11/21/2019     @PREFPERIOPPHARMACY @   Your procedure is scheduled on  11/25/2019.  Report to [facility] at  0800  A.M.  Call this number if you have problems the morning of surgery:  [phone]   Remember:  Follow the diet and prep instructions given to you by the office.                      Take these medicines the morning of surgery with A SIP OF WATER  Gabapentin, hydrocodone(if needed), pomalyst. Use your inhaler before you come.    Do not wear jewelry, make-up or nail polish.  Do not wear lotions, powders, or perfumes. Please wear deodorant and brush your teeth.  Do not shave 48 hours prior to surgery.  Men may shave face and neck.  Do not bring valuables to the hospital.  [facility] is not responsible for any belongings or valuables.  Contacts, dentures or bridgework may not be worn into surgery.  Leave your suitcase in the car.  After surgery it may be brought to your room.  For patients admitted to the hospital, discharge time will be determined by your treatment team.  Patients discharged the day of surgery will not be allowed to drive home.   Name and phone number of your driver:   family Special instructions:  DO NOT smoke the morning of your procedure.  Please read over the following fact sheets that you were given. Anesthesia Post-op Instructions and Care and Recovery After Surgery       Colonoscopy, Adult, Care After This sheet gives you information about how to care for yourself after your procedure. Your health care provider may also give you more specific instructions. If you have problems or questions, contact your health care provider. What can I expect after the procedure? After the procedure, it is common to have:  A small amount of blood in your stool for 24 hours after the procedure.  Some gas.  Mild cramping or bloating of your abdomen. Follow these instructions at home: Eating and drinking   Drink enough  fluid to keep your urine pale yellow.  Follow instructions from your health care provider about eating or drinking restrictions.  Resume your normal diet as instructed by your health care provider. Avoid heavy or fried foods that are hard to digest. Activity  Rest as told by your health care provider.  Avoid sitting for a long time without moving. Get up to take short walks every 1-2 hours. This is important to improve blood flow and breathing. Ask for help if you feel weak or unsteady.  Return to your normal activities as told by your health care provider. Ask your health care provider what activities are safe for you. Managing cramping and bloating   Try walking around when you have cramps or feel bloated.  Apply heat to your abdomen as told by your health care provider. Use the heat source that your health care provider recommends, such as a moist heat pack or a heating pad. ? Place a towel between your skin and the heat source. ? Leave the heat on for 20-30 minutes. ? Remove the heat if your skin turns bright red. This is especially important if you are unable to feel pain, heat, or cold. You may have a greater risk of getting burned. General instructions  For the first 24 hours after the procedure: ? Do not drive  or use machinery. ? Do not sign important documents. ? Do not drink alcohol. ? Do your regular daily activities at a slower pace than normal. ? Eat soft foods that are easy to digest.  Take over-the-counter and prescription medicines only as told by your health care provider.  Keep all follow-up visits as told by your health care provider. This is important. Contact a health care provider if:  You have blood in your stool 2-3 days after the procedure. Get help right away if you have:  More than a small spotting of blood in your stool.  Large blood clots in your stool.  Swelling of your abdomen.  Nausea or vomiting.  A fever.  Increasing pain in your  abdomen that is not relieved with medicine. Summary  After the procedure, it is common to have a small amount of blood in your stool. You may also have mild cramping and bloating of your abdomen.  For the first 24 hours after the procedure, do not drive or use machinery, sign important documents, or drink alcohol.  Get help right away if you have a lot of blood in your stool, nausea or vomiting, a fever, or increased pain in your abdomen. This information is not intended to replace advice given to you by your health care provider. Make sure you discuss any questions you have with your health care provider. Document Revised: 08/19/2018 Document Reviewed: 08/19/2018 Elsevier Patient Education  [place] After These instructions provide you with information about caring for yourself after your procedure. Your health care provider may also give you more specific instructions. Your treatment has been planned according to current medical practices, but problems sometimes occur. Call your health care provider if you have any problems or questions after your procedure. What can I expect after the procedure? After your procedure, you may:  Feel sleepy for several hours.  Feel clumsy and have poor balance for several hours.  Feel forgetful about what happened after the procedure.  Have poor judgment for several hours.  Feel nauseous or vomit.  Have a sore throat if you had a breathing tube during the procedure. Follow these instructions at home: For at least 24 hours after the procedure:      Have a responsible adult stay with you. It is important to have someone help care for you until you are awake and alert.  Rest as needed.  Do not: ? Participate in activities in which you could fall or become injured. ? Drive. ? Use heavy machinery. ? Drink alcohol. ? Take sleeping pills or medicines that cause drowsiness. ? Make important decisions or  sign legal documents. ? Take care of children on your own. Eating and drinking  Follow the diet that is recommended by your health care provider.  If you vomit, drink water, juice, or soup when you can drink without vomiting.  Make sure you have little or no nausea before eating solid foods. General instructions  Take over-the-counter and prescription medicines only as told by your health care provider.  If you have sleep apnea, surgery and certain medicines can increase your risk for breathing problems. Follow instructions from your health care provider about wearing your sleep device: ? Anytime you are sleeping, including during daytime naps. ? While taking prescription pain medicines, sleeping medicines, or medicines that make you drowsy.  If you smoke, do not smoke without supervision.  Keep all follow-up visits as told by your health care provider. This is  important. Contact a health care provider if:  You keep feeling nauseous or you keep vomiting.  You feel light-headed.  You develop a rash.  You have a fever. Get help right away if:  You have trouble breathing. Summary  For several hours after your procedure, you may feel sleepy and have poor judgment.  Have a responsible adult stay with you for at least 24 hours or until you are awake and alert. This information is not intended to replace advice given to you by your health care provider. Make sure you discuss any questions you have with your health care provider. Document Revised: 04/23/2017 Document Reviewed: 05/16/2015 Elsevier Patient Education  [place].

## 2019-11-24 ENCOUNTER — Encounter (HOSPITAL_COMMUNITY): Payer: Self-pay

## 2019-11-24 ENCOUNTER — Other Ambulatory Visit: Payer: Self-pay

## 2019-11-24 ENCOUNTER — Encounter (HOSPITAL_COMMUNITY)
Admission: RE | Admit: 2019-11-24 | Discharge: 2019-11-24 | Disposition: A | Discharge status: Home / Self Care | Payer: Medicare Other | Source: Ambulatory Visit | Attending: Internal Medicine | Admitting: Internal Medicine

## 2019-11-24 ENCOUNTER — Other Ambulatory Visit (HOSPITAL_COMMUNITY)
Admission: RE | Admit: 2019-11-24 | Discharge: 2019-11-24 | Disposition: A | Discharge status: Home / Self Care | Payer: Medicare Other | Source: Ambulatory Visit | Attending: Internal Medicine | Admitting: Internal Medicine

## 2019-11-24 DIAGNOSIS — Z01812 Encounter for preprocedural laboratory examination: Secondary | ICD-10-CM | POA: Diagnosis not present

## 2019-11-24 DIAGNOSIS — Z20822 Contact with and (suspected) exposure to covid-19: Secondary | ICD-10-CM | POA: Diagnosis not present

## 2019-11-24 LAB — SARS CORONAVIRUS 2 (TAT 6-24 HRS): SARS Coronavirus 2: NEGATIVE

## 2019-11-24 NOTE — Pre-Procedure Instructions (Signed)
Patient in for PAT and did not start clear liquids after lunch yesterday, but has had nothing to eat today. I called Jeralyn Bennett, RN, endo supervisor, who spoke with Dr Abbey Chatters. Dr Abbey Chatters is okay with patient starting prep today and having colonoscopy tomorrow. Patient verbalizes understanding of prep.

## 2019-11-25 ENCOUNTER — Ambulatory Visit (HOSPITAL_COMMUNITY): Payer: Medicare Other | Admitting: Certified Registered"

## 2019-11-25 ENCOUNTER — Encounter (HOSPITAL_COMMUNITY)
Admission: RE | Disposition: A | Discharge status: Home / Self Care | Payer: Self-pay | Source: Home / Self Care | Attending: Internal Medicine

## 2019-11-25 ENCOUNTER — Ambulatory Visit (HOSPITAL_COMMUNITY)
Admission: RE | Admit: 2019-11-25 | Discharge: 2019-11-25 | Disposition: A | Discharge status: Home / Self Care | Payer: Medicare Other | Attending: Internal Medicine | Admitting: Internal Medicine

## 2019-11-25 ENCOUNTER — Other Ambulatory Visit (HOSPITAL_COMMUNITY): Payer: Self-pay | Admitting: Surgery

## 2019-11-25 ENCOUNTER — Encounter (HOSPITAL_COMMUNITY): Payer: Self-pay

## 2019-11-25 DIAGNOSIS — Z7982 Long term (current) use of aspirin: Secondary | ICD-10-CM | POA: Diagnosis not present

## 2019-11-25 DIAGNOSIS — Z888 Allergy status to other drugs, medicaments and biological substances status: Secondary | ICD-10-CM | POA: Diagnosis not present

## 2019-11-25 DIAGNOSIS — M1990 Unspecified osteoarthritis, unspecified site: Secondary | ICD-10-CM | POA: Diagnosis not present

## 2019-11-25 DIAGNOSIS — K5730 Diverticulosis of large intestine without perforation or abscess without bleeding: Secondary | ICD-10-CM | POA: Insufficient documentation

## 2019-11-25 DIAGNOSIS — Z8249 Family history of ischemic heart disease and other diseases of the circulatory system: Secondary | ICD-10-CM | POA: Insufficient documentation

## 2019-11-25 DIAGNOSIS — K648 Other hemorrhoids: Secondary | ICD-10-CM | POA: Insufficient documentation

## 2019-11-25 DIAGNOSIS — Z79899 Other long term (current) drug therapy: Secondary | ICD-10-CM | POA: Diagnosis not present

## 2019-11-25 DIAGNOSIS — G4730 Sleep apnea, unspecified: Secondary | ICD-10-CM | POA: Insufficient documentation

## 2019-11-25 DIAGNOSIS — I129 Hypertensive chronic kidney disease with stage 1 through stage 4 chronic kidney disease, or unspecified chronic kidney disease: Secondary | ICD-10-CM | POA: Diagnosis not present

## 2019-11-25 DIAGNOSIS — Z1211 Encounter for screening for malignant neoplasm of colon: Secondary | ICD-10-CM

## 2019-11-25 DIAGNOSIS — Z886 Allergy status to analgesic agent status: Secondary | ICD-10-CM | POA: Diagnosis not present

## 2019-11-25 DIAGNOSIS — N183 Chronic kidney disease, stage 3 unspecified: Secondary | ICD-10-CM | POA: Insufficient documentation

## 2019-11-25 HISTORY — PX: COLONOSCOPY WITH PROPOFOL: SHX5780

## 2019-11-25 SURGERY — COLONOSCOPY WITH PROPOFOL
Anesthesia: General

## 2019-11-25 MED ORDER — HEPARIN SOD (PORK) LOCK FLUSH 100 UNIT/ML IV SOLN
500.0000 [IU] | INTRAVENOUS | Status: AC | PRN
  Administered 2019-11-25: 500 [IU]

## 2019-11-25 MED ORDER — STERILE WATER FOR IRRIGATION IR SOLN
Status: DC | PRN
  Administered 2019-11-25: 100 mL

## 2019-11-25 MED ORDER — PROPOFOL 10 MG/ML IV BOLUS
INTRAVENOUS | Status: DC | PRN
  Administered 2019-11-25: 150 ug/kg/min via INTRAVENOUS
  Administered 2019-11-25: 60 mg via INTRAVENOUS

## 2019-11-25 MED ORDER — CHLORHEXIDINE GLUCONATE CLOTH 2 % EX PADS: 6.0000 | MEDICATED_PAD | Freq: Once | CUTANEOUS | Status: DC

## 2019-11-25 MED ORDER — HEPARIN SOD (PORK) LOCK FLUSH 100 UNIT/ML IV SOLN
INTRAVENOUS | Status: AC
  Filled 2019-11-25: qty 5

## 2019-11-25 MED ORDER — LACTATED RINGERS IV SOLN: INTRAVENOUS | Status: DC | PRN

## 2019-11-25 MED ORDER — LACTATED RINGERS IV SOLN: INTRAVENOUS | Status: DC

## 2019-11-25 NOTE — Anesthesia Postprocedure Evaluation (Signed)
Anesthesia Post Note  Patient: Kelli Hurley  Procedure(s) Performed: COLONOSCOPY WITH PROPOFOL (N/A )  Patient location during evaluation: PACU Anesthesia Type: General Level of consciousness: awake, oriented, awake and alert and patient cooperative Pain management: pain level controlled Vital Signs Assessment: post-procedure vital signs reviewed and stable Respiratory status: spontaneous breathing, respiratory function stable and nonlabored ventilation Cardiovascular status: blood pressure returned to baseline and stable Postop Assessment: no headache and no backache Anesthetic complications: no   No complications documented.   Last Vitals: There were no vitals filed for this visit.  Last Pain:  Vitals:   11/25/19 0931  PainSc: 0-No pain                 Tacy Learn

## 2019-11-25 NOTE — Anesthesia Preprocedure Evaluation (Signed)
Anesthesia Evaluation  Patient identified by MRN, date of birth, ID band Patient awake    Reviewed: Allergy & Precautions, H&P , NPO status , Patient's Chart, lab work & pertinent test results, reviewed documented beta blocker date and time   Airway Mallampati: III  TM Distance: >3 FB Neck ROM: full    Dental no notable dental hx.    Pulmonary sleep apnea and Oxygen sleep apnea ,    Pulmonary exam normal breath sounds clear to auscultation       Cardiovascular Exercise Tolerance: Good hypertension, + DOE   Rhythm:regular Rate:Normal     Neuro/Psych negative neurological ROS  negative psych ROS   GI/Hepatic negative GI ROS, Neg liver ROS,   Endo/Other  negative endocrine ROSMorbid obesity  Renal/GU CRFRenal diseasenegative Renal ROS  negative genitourinary   Musculoskeletal  (+) Arthritis ,   Abdominal   Peds  Hematology negative hematology ROS (+) Blood dyscrasia, anemia ,   Anesthesia Other Findings   Reproductive/Obstetrics negative OB ROS                             Anesthesia Physical Anesthesia Plan  ASA: III  Anesthesia Plan: General   Post-op Pain Management:    Induction:   PONV Risk Score and Plan: Propofol infusion  Airway Management Planned:   Additional Equipment:   Intra-op Plan:   Post-operative Plan:   Informed Consent: I have reviewed the patients History and Physical, chart, labs and discussed the procedure including the risks, benefits and alternatives for the proposed anesthesia with the patient or authorized representative who has indicated his/her understanding and acceptance.     Dental Advisory Given  Plan Discussed with: CRNA  Anesthesia Plan Comments:         Anesthesia Quick Evaluation

## 2019-11-25 NOTE — Op Note (Signed)
[facility] Patient Name: Kelli Hurley Procedure Date: 11/25/2019 9:26 AM MRN: [id number] Date of Birth: 03-31-1955 Attending MD: Elon Alas. Abbey Chatters DO CSN: [id number] Age: 64 Admit Type: Outpatient Procedure:                Colonoscopy Indications:              Screening for colorectal malignant neoplasm Providers:                Elon Alas. Abbey Chatters, DO, Lambert Mody, Dereck Leep, Technician, Randa Spike,                            Technician Referring MD:              Medicines:                See the Anesthesia note for documentation of the                            administered medications Complications:            No immediate complications. Estimated Blood Loss:     Estimated blood loss: none. Procedure:                Pre-Anesthesia Assessment:                           - The anesthesia plan was to use monitored                            anesthesia care (MAC).                           After obtaining informed consent, the colonoscope                            was passed under direct vision. Throughout the                            procedure, the patient's blood pressure, pulse, and                            oxygen saturations were monitored continuously. The                            [id number]-HQ190L ([id number]) scope was introduced through                            the anus and advanced to the the cecum, identified                            by appendiceal orifice and ileocecal valve. The                            colonoscopy was performed without difficulty. The  patient tolerated the procedure well. The quality                            of the bowel preparation was evaluated using the                            BBPS [facility] Bowel Preparation Scale) with scores                            of: Right Colon = 2 (minor amount of residual                            staining, small fragments of  stool and/or opaque                            liquid, but mucosa seen well), Transverse Colon = 2                            (minor amount of residual staining, small fragments                            of stool and/or opaque liquid, but mucosa seen                            well) and Left Colon = 2 (minor amount of residual                            staining, small fragments of stool and/or opaque                            liquid, but mucosa seen well). The total BBPS score                            equals 6. The quality of the bowel preparation was                            fair. Scope In: 9:35:47 AM Scope Out: 9:48:40 AM Scope Withdrawal Time: 0 hours 9 minutes 20 seconds  Total Procedure Duration: 0 hours 12 minutes 53 seconds  Findings:      The perianal and digital rectal examinations were normal.      Non-bleeding internal hemorrhoids were found during endoscopy.      Many small and large-mouthed diverticula were found in the sigmoid       colon, descending colon and transverse colon.      A moderate amount of stool was found in the entire colon, making       visualization difficult. Lavage of the area was performed using copious       amounts, resulting in clearance with fair visualization. Impression:               - Preparation of the colon was fair.                           - Non-bleeding  internal hemorrhoids.                           - Diverticulosis in the sigmoid colon, in the                            descending colon and in the transverse colon.                           - Stool in the entire examined colon.                           - No specimens collected. Moderate Sedation:      Per Anesthesia Care Recommendation:           - Patient has a contact number available for                            emergencies. The signs and symptoms of potential                            delayed complications were discussed with the                            patient.  Return to normal activities tomorrow.                            Written discharge instructions were provided to the                            patient.                           - Resume previous diet.                           - Continue present medications.                           - Repeat colonoscopy in 10 years for screening                            purposes.                           - Return to GI clinic PRN. Procedure Code(s):        --- Professional ---                           W5809, Colorectal cancer screening; colonoscopy on                            individual not meeting criteria for high risk Diagnosis Code(s):        --- Professional ---                           Z12.11, Encounter for screening for  malignant                            neoplasm of colon                           K64.8, Other hemorrhoids                           K57.30, Diverticulosis of large intestine without                            perforation or abscess without bleeding CPT copyright 2019 American Medical Association. All rights reserved. The codes documented in this report are preliminary and upon coder review may  be revised to meet current compliance requirements. Elon Alas. Abbey Chatters, DO [place] Merranda Bolls, DO 11/25/2019 9:58:00 AM This report has been signed electronically. Number of Addenda: 0

## 2019-11-25 NOTE — Transfer of Care (Signed)
Immediate Anesthesia Transfer of Care Note  Patient: Kelli Hurley  Procedure(s) Performed: COLONOSCOPY WITH PROPOFOL (N/A )  Patient Location: PACU  Anesthesia Type:General  Level of Consciousness: awake, alert , oriented and patient cooperative  Airway & Oxygen Therapy: Patient Spontanous Breathing  Post-op Assessment: Report given to RN, Post -op Vital signs reviewed and stable and Patient moving all extremities  Post vital signs: Reviewed  Last Vitals:  Vitals Value Taken Time  BP 115/87 11/25/19 0952  Temp    Pulse 85 11/25/19 0953  Resp 18 11/25/19 0953  SpO2 98 % 11/25/19 0953  Vitals shown include unvalidated device data.  Last Pain:  Vitals:   11/25/19 0931  PainSc: 0-No pain         Complications: No complications documented.

## 2019-11-25 NOTE — Discharge Instructions (Signed)
  Colonoscopy Discharge Instructions  Read the instructions outlined below and refer to this sheet in the next few weeks. These discharge instructions provide you with general information on caring for yourself after you leave the hospital. Your doctor may also give you specific instructions. While your treatment has been planned according to the most current medical practices available, unavoidable complications occasionally occur.   ACTIVITY  You may resume your regular activity, but move at a slower pace for the next 24 hours.   Take frequent rest periods for the next 24 hours.   Walking will help get rid of the air and reduce the bloated feeling in your belly (abdomen).   No driving for 24 hours (because of the medicine (anesthesia) used during the test).    Do not sign any important legal documents or operate any machinery for 24 hours (because of the anesthesia used during the test).  NUTRITION  Drink plenty of fluids.   You may resume your normal diet as instructed by your doctor.   Begin with a light meal and progress to your normal diet. Heavy or fried foods are harder to digest and may make you feel sick to your stomach (nauseated).   Avoid alcoholic beverages for 24 hours or as instructed.  MEDICATIONS  You may resume your normal medications unless your doctor tells you otherwise.  WHAT YOU CAN EXPECT TODAY  Some feelings of bloating in the abdomen.   Passage of more gas than usual.   Spotting of blood in your stool or on the toilet paper.  IF YOU HAD POLYPS REMOVED DURING THE COLONOSCOPY:  No aspirin products for 7 days or as instructed.   No alcohol for 7 days or as instructed.   Eat a soft diet for the next 24 hours.  FINDING OUT THE RESULTS OF YOUR TEST Not all test results are available during your visit. If your test results are not back during the visit, make an appointment with your caregiver to find out the results. Do not assume everything is normal if  you have not heard from your caregiver or the medical facility. It is important for you to follow up on all of your test results.  SEEK IMMEDIATE MEDICAL ATTENTION IF:  You have more than a spotting of blood in your stool.   Your belly is swollen (abdominal distention).   You are nauseated or vomiting.   You have a temperature over 101.   You have abdominal pain or discomfort that is severe or gets worse throughout the day.   I did not find any polyps today. I recommend repeating colonoscopy in 10 years for screening purposes.   You have diverticulosis and internal hemorrhoids. I would recommend increasing fiber in your diet or adding OTC Benefiber/Metamucil. Be sure to drink at least 4 to 6 glasses of water daily. Follow-up with GI as needed.   I hope you have a great rest of your week!  Elon Alas. Abbey Chatters, D.O. Gastroenterology and Hepatology [facility]

## 2019-11-25 NOTE — H&P (Signed)
Primary Care Physician:  Alfonse Flavors, MD Primary Gastroenterologist:  Dr. Abbey Chatters  Pre-Procedure History & Physical: HPI:  Kelli Hurley is a 64 y.o. female is here for a screening colonoscopy.   Past Medical History:  Diagnosis Date  . Anemia   . Arthritis   . Cancer ([place])   . Claustrophobia 10/05/2014  . Hypertension   . Leukopenia 08/03/2014  . Normocytic hypochromic anemia 08/03/2014  . Renal disorder    stage 3     Past Surgical History:  Procedure Laterality Date  . ABDOMINAL HYSTERECTOMY    . CARDIAC SURGERY     28 months old. States she had a leaky valve.   . COLONOSCOPY  03/26/2009   Dr. Oneida Alar; normal colon, small internal hemorrhoids.  Recommended repeat colonoscopy in 10 years.  . OTHER SURGICAL HISTORY     heart surgery as infant to "repair hole in heart"  . PORTACATH PLACEMENT Left 02/21/2019   Procedure: INSERTION PORT-A-CATH;  Surgeon: Virl Cagey, MD;  Location: [facility];  Service: General;  Laterality: Left;    Prior to Admission medications   Medication Sig Start Date End Date Taking? Authorizing Provider  acetaminophen (TYLENOL 8 HOUR ARTHRITIS PAIN) 650 MG CR tablet Take 650 mg by mouth every 8 (eight) hours as needed for pain.    Yes [provider]  acyclovir (ZOVIRAX) 400 MG tablet Take 1 tablet (400 mg total) by mouth 2 (two) times daily. 09/29/19  Yes Derek Jack, MD  albuterol (VENTOLIN HFA) 108 (90 Base) MCG/ACT inhaler Inhale 2 puffs into the lungs every 6 (six) hours as needed for wheezing or shortness of breath. 02/12/19  Yes Roger Shelter, FNP  Ascorbic Acid (VITAMIN C) 1000 MG tablet Take 1,000 mg by mouth daily.    Yes [provider]  aspirin EC 81 MG tablet Take 81 mg by mouth daily.   Yes [provider]  calcium carbonate (OS-CAL) 1250 (500 Ca) MG chewable tablet Chew 1 tablet by mouth daily.    Yes [provider]  cholecalciferol (VITAMIN D) 1000 units tablet Take 1,000 Units by  mouth daily.   Yes [provider]  gabapentin (NEURONTIN) 300 MG capsule TAKE (1) CAPSULE BY MOUTH TWICE DAILY. Patient taking differently: Take 300 mg by mouth 2 (two) times daily.  10/27/19  Yes Derek Jack, MD  HYDROcodone-acetaminophen (NORCO/VICODIN) 5-325 MG tablet Take 1 tablet by mouth every 12 (twelve) hours as needed for moderate pain. 11/05/19  Yes Derek Jack, MD  lidocaine-prilocaine (EMLA) cream Apply a quarter sized glob 1 hour prior to treatment. Patient taking differently: Apply 1 application topically daily as needed (prior to port access).  02/26/19  Yes Derek Jack, MD  metolazone (ZAROXOLYN) 2.5 MG tablet TAKE 1 TABLET BY MOUTH ONCE DAILY. Patient taking differently: Take 2.5 mg by mouth daily.  11/10/19  Yes Derek Jack, MD  Multiple Vitamin (TAB-A-VITE) TABS TAKE 1 TABLET BY MOUTH ONCE DAILY. Patient taking differently: Take 1 tablet by mouth daily.  04/10/17  Yes Holley Bouche, NP  pomalidomide (POMALYST) 4 MG capsule Take 1 capsule (4 mg total) by mouth daily. Patient taking differently: Take 4 mg by mouth See admin instructions. Take 4 mg by mouth daily for 21 days, then 7 days off and repeat cycle 11/19/19  Yes Derek Jack, MD  potassium chloride SA (KLOR-CON) 20 MEQ tablet TAKE 2 TABLET BY MOUTH ON MONDAY, WEDNESDAY, AND FRIDAY. Patient taking differently: Take 20 mEq by mouth daily.  11/03/19  Yes Derek Jack, MD  trolamine salicylate (ASPERCREME) 10 % cream Apply 1 application topically 2 (two) times daily as needed for muscle pain.    Yes [provider]  bumetanide (BUMEX) 1 MG tablet Take 2 tablets (2 mg total) by mouth daily. Patient not taking: Reported on 11/20/2019 09/29/19   Derek Jack, MD  CARFILZOMIB IV Inject into the vein.  Patient not taking: Reported on 11/20/2019 03/05/19   [provider]  dexamethasone (DECADRON) 4 MG tablet Take 10 tablets (40 mg total) by mouth  once a week. On the week you are off of treatment Patient not taking: Reported on 11/20/2019 05/21/19   Derek Jack, MD  magnesium oxide (MAG-OX) 400 (241.3 Mg) MG tablet TAKE 1 TABLET BY MOUTH TWICE DAILY Patient not taking: Reported on 11/20/2019 07/14/19   Derek Jack, MD    Allergies as of 10/07/2019 - Review Complete 09/10/2019  Allergen Reaction Noted  . Diclofenac Swelling 09/23/2018    Family History  Problem Relation Age of Onset  . Cancer Mother   . Hypertension Mother   . Cancer Father   . Hypertension Father   . Cancer Maternal Grandmother   . Hypertension Sister   . Hypertension Brother   . Prostate cancer Brother   . Colon cancer Neg Hx   . Colon polyps Neg Hx     Social History   Socioeconomic History  . Marital status: Legally Separated    Spouse name: Not on file  . Number of children: Not on file  . Years of education: Not on file  . Highest education level: Not on file  Occupational History  . Not on file  Tobacco Use  . Smoking status: Never Smoker  . Smokeless tobacco: Never Used  Vaping Use  . Vaping Use: Never used  Substance and Sexual Activity  . Alcohol use: No  . Drug use: No  . Sexual activity: Not on file    Comment: divorced- 2 daughters  Other Topics Concern  . Not on file  Social History Narrative  . Not on file   Social Determinants of Health   Financial Resource Strain:   . Difficulty of Paying Living Expenses: Not on file  Food Insecurity:   . Worried About Charity fundraiser in the Last Year: Not on file  . Ran Out of Food in the Last Year: Not on file  Transportation Needs:   . Lack of Transportation (Medical): Not on file  . Lack of Transportation (Non-Medical): Not on file  Physical Activity:   . Days of Exercise per Week: Not on file  . Minutes of Exercise per Session: Not on file  Stress:   . Feeling of Stress : Not on file  Social Connections:   . Frequency of Communication with Friends and  Family: Not on file  . Frequency of Social Gatherings with Friends and Family: Not on file  . Attends Religious Services: Not on file  . Active Member of Clubs or Organizations: Not on file  . Attends Archivist Meetings: Not on file  . Marital Status: Not on file  Intimate Partner Violence:   . Fear of Current or Ex-Partner: Not on file  . Emotionally Abused: Not on file  . Physically Abused: Not on file  . Sexually Abused: Not on file    Review of Systems: See HPI, otherwise negative ROS  Impression/Plan: Kelli Hurley is here for a colonoscopy to be performed for screening  Risks, benefits,  limitations, imponderables and alternatives regarding colonoscopy have been reviewed with the patient. Questions have been answered. All parties agreeable.

## 2019-11-26 ENCOUNTER — Inpatient Hospital Stay (HOSPITAL_COMMUNITY): Payer: Medicare Other | Attending: Hematology

## 2019-11-26 ENCOUNTER — Other Ambulatory Visit (HOSPITAL_COMMUNITY): Payer: Self-pay

## 2019-11-26 ENCOUNTER — Encounter (HOSPITAL_COMMUNITY): Payer: Self-pay

## 2019-11-26 ENCOUNTER — Inpatient Hospital Stay (HOSPITAL_COMMUNITY): Payer: Medicare Other

## 2019-11-26 DIAGNOSIS — Z8249 Family history of ischemic heart disease and other diseases of the circulatory system: Secondary | ICD-10-CM | POA: Insufficient documentation

## 2019-11-26 DIAGNOSIS — C9000 Multiple myeloma not having achieved remission: Secondary | ICD-10-CM | POA: Insufficient documentation

## 2019-11-26 DIAGNOSIS — E876 Hypokalemia: Secondary | ICD-10-CM | POA: Diagnosis not present

## 2019-11-26 DIAGNOSIS — Z8042 Family history of malignant neoplasm of prostate: Secondary | ICD-10-CM | POA: Diagnosis not present

## 2019-11-26 DIAGNOSIS — Z23 Encounter for immunization: Secondary | ICD-10-CM | POA: Diagnosis not present

## 2019-11-26 DIAGNOSIS — G629 Polyneuropathy, unspecified: Secondary | ICD-10-CM | POA: Diagnosis not present

## 2019-11-26 DIAGNOSIS — R0602 Shortness of breath: Secondary | ICD-10-CM | POA: Diagnosis not present

## 2019-11-26 DIAGNOSIS — R600 Localized edema: Secondary | ICD-10-CM | POA: Insufficient documentation

## 2019-11-26 DIAGNOSIS — M25561 Pain in right knee: Secondary | ICD-10-CM | POA: Diagnosis not present

## 2019-11-26 DIAGNOSIS — Z79899 Other long term (current) drug therapy: Secondary | ICD-10-CM | POA: Diagnosis not present

## 2019-11-26 DIAGNOSIS — Z809 Family history of malignant neoplasm, unspecified: Secondary | ICD-10-CM | POA: Diagnosis not present

## 2019-11-26 DIAGNOSIS — Z8719 Personal history of other diseases of the digestive system: Secondary | ICD-10-CM | POA: Insufficient documentation

## 2019-11-26 DIAGNOSIS — M25562 Pain in left knee: Secondary | ICD-10-CM | POA: Insufficient documentation

## 2019-11-26 DIAGNOSIS — M7989 Other specified soft tissue disorders: Secondary | ICD-10-CM | POA: Diagnosis not present

## 2019-11-26 DIAGNOSIS — R5383 Other fatigue: Secondary | ICD-10-CM | POA: Insufficient documentation

## 2019-11-26 LAB — CBC WITH DIFFERENTIAL/PLATELET
Abs Immature Granulocytes: 0.01 10*3/uL (ref 0.00–0.07)
Basophils Absolute: 0.1 10*3/uL (ref 0.0–0.1)
Basophils Relative: 2 %
Eosinophils Absolute: 0.1 10*3/uL (ref 0.0–0.5)
Eosinophils Relative: 3 %
HCT: 33.2 % — ABNORMAL LOW (ref 36.0–46.0)
Hemoglobin: 10.8 g/dL — ABNORMAL LOW (ref 12.0–15.0)
Immature Granulocytes: 0 %
Lymphocytes Relative: 32 %
Lymphs Abs: 1 10*3/uL (ref 0.7–4.0)
MCH: 30.3 pg (ref 26.0–34.0)
MCHC: 32.5 g/dL (ref 30.0–36.0)
MCV: 93.3 fL (ref 80.0–100.0)
Monocytes Absolute: 0.5 10*3/uL (ref 0.1–1.0)
Monocytes Relative: 16 %
Neutro Abs: 1.5 10*3/uL — ABNORMAL LOW (ref 1.7–7.7)
Neutrophils Relative %: 47 %
Platelets: 316 10*3/uL (ref 150–400)
RBC: 3.56 MIL/uL — ABNORMAL LOW (ref 3.87–5.11)
RDW: 14 % (ref 11.5–15.5)
WBC: 3.2 10*3/uL — ABNORMAL LOW (ref 4.0–10.5)
nRBC: 0 % (ref 0.0–0.2)

## 2019-11-26 LAB — COMPREHENSIVE METABOLIC PANEL
ALT: 12 U/L (ref 0–44)
AST: 18 U/L (ref 15–41)
Albumin: 3.8 g/dL (ref 3.5–5.0)
Alkaline Phosphatase: 44 U/L (ref 38–126)
Anion gap: 12 (ref 5–15)
BUN: 25 mg/dL — ABNORMAL HIGH (ref 8–23)
CO2: 31 mmol/L (ref 22–32)
Calcium: 9.8 mg/dL (ref 8.9–10.3)
Chloride: 96 mmol/L — ABNORMAL LOW (ref 98–111)
Creatinine, Ser: 1.48 mg/dL — ABNORMAL HIGH (ref 0.44–1.00)
GFR, Estimated: 38 mL/min — ABNORMAL LOW (ref >60)
Glucose, Bld: 119 mg/dL — ABNORMAL HIGH (ref 70–99)
Potassium: 3.2 mmol/L — ABNORMAL LOW (ref 3.5–5.1)
Sodium: 139 mmol/L (ref 135–145)
Total Bilirubin: 0.4 mg/dL (ref 0.3–1.2)
Total Protein: 7.2 g/dL (ref 6.5–8.1)

## 2019-11-26 MED ORDER — HEPARIN SOD (PORK) LOCK FLUSH 100 UNIT/ML IV SOLN
500.0000 [IU] | Freq: Once | INTRAVENOUS | Status: AC
  Administered 2019-11-26: 500 [IU] via INTRAVENOUS

## 2019-11-26 MED ORDER — SODIUM CHLORIDE 0.9% FLUSH
20.0000 mL | INTRAVENOUS | Status: DC | PRN
  Administered 2019-11-26: 20 mL via INTRAVENOUS

## 2019-11-26 NOTE — Patient Instructions (Signed)
[facility] at [facility] Discharge Instructions  Labs drawn from portacath today. Follow-up as scheduled   Thank you for choosing [facility] at [facility] to provide your oncology and hematology care.  To afford each patient quality time with our provider, please arrive at least 15 minutes before your scheduled appointment time.   If you have a lab appointment with the Cancer Center please come in thru the Main Entrance and check in at the main information desk.  You need to re-schedule your appointment should you arrive 10 or more minutes late.  We strive to give you quality time with our providers, and arriving late affects you and other patients whose appointments are after yours.  Also, if you no show three or more times for appointments you may be dismissed from the clinic at the providers discretion.     Again, thank you for choosing [facility].  Our hope is that these requests will decrease the amount of time that you wait before being seen by our physicians.       _____________________________________________________________  Should you have questions after your visit to  Cancer Center, please contact our office at [phone] and follow the prompts.  Our office hours are 8:00 a.m. and 4:30 p.m. Monday - Friday.  Please note that voicemails left after 4:00 p.m. may not be returned until the following business day.  We are closed weekends and major holidays.  You do have access to a nurse 24-7, just call the main number to the clinic [phone] and do not press any options, hold on the line and a nurse will answer the phone.    For prescription refill requests, have your pharmacy contact our office and allow 72 hours.    Due to Covid, you will need to wear a mask upon entering the hospital. If you do not have a mask, a mask will be given to you at the Main Entrance upon arrival. For doctor visits, patients may have  1 support person age 18 or older with them. For treatment visits, patients can not have anyone with them due to social distancing guidelines and our immunocompromised population.     

## 2019-11-26 NOTE — Progress Notes (Signed)
Kelli Hurley tolerated port lab draw well without complaints or incident. Port accessed with 20 gauge needle with blood drawn for labs ordered then flushed easily per protocol and de-accessed. VSS Pt discharged via wheelchair in satisfactory condition

## 2019-11-27 LAB — KAPPA/LAMBDA LIGHT CHAINS
Kappa free light chain: 77.1 mg/L — ABNORMAL HIGH (ref 3.3–19.4)
Kappa, lambda light chain ratio: 2.14 — ABNORMAL HIGH (ref 0.26–1.65)
Lambda free light chains: 36.1 mg/L — ABNORMAL HIGH (ref 5.7–26.3)

## 2019-11-28 ENCOUNTER — Encounter (HOSPITAL_COMMUNITY): Payer: Self-pay | Admitting: Internal Medicine

## 2019-11-28 LAB — PROTEIN ELECTROPHORESIS, SERUM
A/G Ratio: 1.2 (ref 0.7–1.7)
Albumin ELP: 3.6 g/dL (ref 2.9–4.4)
Alpha-1-Globulin: 0.2 g/dL (ref 0.0–0.4)
Alpha-2-Globulin: 0.6 g/dL (ref 0.4–1.0)
Beta Globulin: 1 g/dL (ref 0.7–1.3)
Gamma Globulin: 1.1 g/dL (ref 0.4–1.8)
Globulin, Total: 3 g/dL (ref 2.2–3.9)
M-Spike, %: 0.3 g/dL — ABNORMAL HIGH
Total Protein ELP: 6.6 g/dL (ref 6.0–8.5)

## 2019-12-03 ENCOUNTER — Other Ambulatory Visit (HOSPITAL_COMMUNITY): Payer: Medicare Other

## 2019-12-03 ENCOUNTER — Inpatient Hospital Stay (HOSPITAL_BASED_OUTPATIENT_CLINIC_OR_DEPARTMENT_OTHER): Payer: Medicare Other | Admitting: Hematology

## 2019-12-03 ENCOUNTER — Other Ambulatory Visit (HOSPITAL_COMMUNITY): Payer: Self-pay | Admitting: *Deleted

## 2019-12-03 ENCOUNTER — Inpatient Hospital Stay (HOSPITAL_COMMUNITY): Payer: Medicare Other

## 2019-12-03 ENCOUNTER — Other Ambulatory Visit: Payer: Self-pay

## 2019-12-03 VITALS — BP 116/61 | HR 82 | Temp 97.3°F | Resp 20

## 2019-12-03 VITALS — BP 144/70 | HR 76 | Temp 97.0°F | Resp 20 | Wt 339.0 lb

## 2019-12-03 DIAGNOSIS — C9001 Multiple myeloma in remission: Secondary | ICD-10-CM

## 2019-12-03 DIAGNOSIS — C9000 Multiple myeloma not having achieved remission: Secondary | ICD-10-CM

## 2019-12-03 MED ORDER — OXYCODONE-ACETAMINOPHEN 5-325 MG PO TABS: 1.0000 | ORAL_TABLET | Freq: Two times a day (BID) | ORAL | 0 refills | Status: DC | PRN

## 2019-12-03 MED ORDER — SODIUM CHLORIDE 0.9% FLUSH
10.0000 mL | Freq: Once | INTRAVENOUS | Status: AC | PRN
  Administered 2019-12-03: 10 mL

## 2019-12-03 MED ORDER — ZOLEDRONIC ACID 4 MG/100ML IV SOLN
4.0000 mg | Freq: Once | INTRAVENOUS | Status: AC
  Administered 2019-12-03: 4 mg via INTRAVENOUS
  Filled 2019-12-03: qty 100

## 2019-12-03 MED ORDER — SODIUM CHLORIDE 0.9 % IV SOLN: Freq: Once | INTRAVENOUS | Status: AC

## 2019-12-03 MED ORDER — HEPARIN SOD (PORK) LOCK FLUSH 100 UNIT/ML IV SOLN
500.0000 [IU] | Freq: Once | INTRAVENOUS | Status: AC | PRN
  Administered 2019-12-03: 500 [IU]

## 2019-12-03 MED ORDER — INFLUENZA VAC SPLIT QUAD 0.5 ML IM SUSY
0.5000 mL | PREFILLED_SYRINGE | Freq: Once | INTRAMUSCULAR | Status: AC
  Administered 2019-12-03: 0.5 mL via INTRAMUSCULAR
  Filled 2019-12-03: qty 0.5

## 2019-12-03 NOTE — Progress Notes (Signed)
[facility] [address], [place] [address]   CLINIC:  Medical Oncology/Hematology  PCP:  Alfonse Flavors, MD [address] [place] / [place] [address]  [phone]  REASON FOR VISIT:  Follow-up for multiple myeloma  PRIOR THERAPY:  1. Bortezomib x 8 cycles from 10/05/2014 to 04/06/2016. 2. Carfilzomib x 6 cycles from 03/05/2019 to 08/06/2019.  CURRENT THERAPY: Pomalyst 3 weeks on/1 week off  INTERVAL HISTORY:  [name], a 64 y.o. female, returns for routine follow-up for her multiple myeloma. Kelli Hurley was last seen on 10/08/2019.  Today she reports feeling well. She is taking only Bumex for her leg swelling and her legs are staying stable and she denies SOB. Her only complaint is pain in her knees due to arthritis; she reports that the Norco Q12H is not helping with the pain and is not making her drowsy. She is tolerating Pomalyst 4 mg well, taking it 3 weeks on and 1 week off, and denies having N/V/D, severe fatigue or rash. She denies having any jaw pain. She reports that her hair is thinning and is wondering if there are any vitamins that she can take.  She goes back to [facility] in March 2022.   REVIEW OF SYSTEMS:  Review of Systems  Constitutional: Positive for fatigue (75%). Negative for appetite change.  Respiratory: Negative for shortness of breath.   Cardiovascular: Negative for leg swelling.  Gastrointestinal: Negative for diarrhea, nausea and vomiting.  Musculoskeletal: Positive for arthralgias (10/10 knees pain).  Skin: Negative for rash.  Neurological: Positive for numbness (R hand).  All other systems reviewed and are negative.   PAST MEDICAL/SURGICAL HISTORY:  Past Medical History:  Diagnosis Date  . Anemia   . Arthritis   . Cancer ([place])   . Claustrophobia 10/05/2014  . Hypertension   . Leukopenia 08/03/2014  . Normocytic hypochromic anemia 08/03/2014  . Renal disorder    stage 3    Past Surgical History:  Procedure  Laterality Date  . ABDOMINAL HYSTERECTOMY    . CARDIAC SURGERY     46 months old. States she had a leaky valve.   . COLONOSCOPY  03/26/2009   Dr. Oneida Alar; normal colon, small internal hemorrhoids.  Recommended repeat colonoscopy in 10 years.  . COLONOSCOPY WITH PROPOFOL N/A 11/25/2019   Procedure: COLONOSCOPY WITH PROPOFOL;  Surgeon: Eloise Harman, DO;  Location: [facility];  Service: Endoscopy;  Laterality: N/A;  10:45am  . OTHER SURGICAL HISTORY     heart surgery as infant to "repair hole in heart"  . PORTACATH PLACEMENT Left 02/21/2019   Procedure: INSERTION PORT-A-CATH;  Surgeon: Virl Cagey, MD;  Location: [facility];  Service: General;  Laterality: Left;    SOCIAL HISTORY:  Social History   Socioeconomic History  . Marital status: Legally Separated    Spouse name: Not on file  . Number of children: Not on file  . Years of education: Not on file  . Highest education level: Not on file  Occupational History  . Not on file  Tobacco Use  . Smoking status: Never Smoker  . Smokeless tobacco: Never Used  Vaping Use  . Vaping Use: Never used  Substance and Sexual Activity  . Alcohol use: No  . Drug use: No  . Sexual activity: Not on file    Comment: divorced- 2 daughters  Other Topics Concern  . Not on file  Social History Narrative  . Not on file   Social  Determinants of Health   Financial Resource Strain:   . Difficulty of Paying Living Expenses: Not on file  Food Insecurity:   . Worried About Charity fundraiser in the Last Year: Not on file  . Ran Out of Food in the Last Year: Not on file  Transportation Needs:   . Lack of Transportation (Medical): Not on file  . Lack of Transportation (Non-Medical): Not on file  Physical Activity:   . Days of Exercise per Week: Not on file  . Minutes of Exercise per Session: Not on file  Stress:   . Feeling of Stress : Not on file  Social Connections:   . Frequency of Communication with Friends and Family: Not on  file  . Frequency of Social Gatherings with Friends and Family: Not on file  . Attends Religious Services: Not on file  . Active Member of Clubs or Organizations: Not on file  . Attends Archivist Meetings: Not on file  . Marital Status: Not on file  Intimate Partner Violence:   . Fear of Current or Ex-Partner: Not on file  . Emotionally Abused: Not on file  . Physically Abused: Not on file  . Sexually Abused: Not on file    FAMILY HISTORY:  Family History  Problem Relation Age of Onset  . Cancer Mother   . Hypertension Mother   . Cancer Father   . Hypertension Father   . Cancer Maternal Grandmother   . Hypertension Sister   . Hypertension Brother   . Prostate cancer Brother   . Colon cancer Neg Hx   . Colon polyps Neg Hx     CURRENT MEDICATIONS:  Current Outpatient Medications  Medication Sig Dispense Refill  . acetaminophen (TYLENOL 8 HOUR ARTHRITIS PAIN) 650 MG CR tablet Take 650 mg by mouth every 8 (eight) hours as needed for pain.     Marland Kitchen acyclovir (ZOVIRAX) 400 MG tablet Take 1 tablet (400 mg total) by mouth 2 (two) times daily. 60 tablet 6  . albuterol (VENTOLIN HFA) 108 (90 Base) MCG/ACT inhaler Inhale 2 puffs into the lungs every 6 (six) hours as needed for wheezing or shortness of breath. 8 g 3  . Ascorbic Acid (VITAMIN C) 1000 MG tablet Take 1,000 mg by mouth daily.     Marland Kitchen aspirin EC 81 MG tablet Take 81 mg by mouth daily.    . calcium carbonate (OS-CAL) 1250 (500 Ca) MG chewable tablet Chew 1 tablet by mouth daily.     . cholecalciferol (VITAMIN D) 1000 units tablet Take 1,000 Units by mouth daily.    Marland Kitchen gabapentin (NEURONTIN) 300 MG capsule TAKE (1) CAPSULE BY MOUTH TWICE DAILY. (Patient taking differently: Take 300 mg by mouth 2 (two) times daily. ) 30 capsule 6  . HYDROcodone-acetaminophen (NORCO/VICODIN) 5-325 MG tablet Take 1 tablet by mouth every 12 (twelve) hours as needed for moderate pain. 30 tablet 0  . lidocaine-prilocaine (EMLA) cream Apply a  quarter sized glob 1 hour prior to treatment. (Patient taking differently: Apply 1 application topically daily as needed (prior to port access). ) 30 g 5  . metolazone (ZAROXOLYN) 2.5 MG tablet TAKE 1 TABLET BY MOUTH ONCE DAILY. (Patient taking differently: Take 2.5 mg by mouth daily. ) 30 tablet 1  . Multiple Vitamin (TAB-A-VITE) TABS TAKE 1 TABLET BY MOUTH ONCE DAILY. (Patient taking differently: Take 1 tablet by mouth daily. ) 30 tablet 11  . pomalidomide (POMALYST) 4 MG capsule Take 1 capsule (4 mg total) by  mouth daily. (Patient taking differently: Take 4 mg by mouth See admin instructions. Take 4 mg by mouth daily for 21 days, then 7 days off and repeat cycle) 21 capsule 0  . potassium chloride SA (KLOR-CON) 20 MEQ tablet TAKE 2 TABLET BY MOUTH ON MONDAY, WEDNESDAY, AND FRIDAY. (Patient taking differently: Take 20 mEq by mouth daily. ) 30 tablet 4  . trolamine salicylate (ASPERCREME) 10 % cream Apply 1 application topically 2 (two) times daily as needed for muscle pain.      No current facility-administered medications for this visit.   Facility-Administered Medications Ordered in Other Visits  Medication Dose Route Frequency Provider Last Rate Last Admin  . heparin lock flush 100 unit/mL  500 Units Intravenous Once Penland, Larene Beach K, MD      . sodium chloride 0.9 % injection 10 mL  10 mL Intravenous Once Penland, Kelby Fam, MD        ALLERGIES:  Allergies  Allergen Reactions  . Diclofenac Swelling    Per pt, facial swelling    PHYSICAL EXAM:  Performance status (ECOG): 1 - Symptomatic but completely ambulatory  Vitals:   12/03/19 1305  BP: (!) 144/70  Pulse: 76  Resp: 20  Temp: (!) 97 F (36.1 C)  SpO2: 97%   Wt Readings from Last 3 Encounters:  12/03/19 (!) 339 lb (153.8 kg)  11/24/19 (!) 354 lb (160.6 kg)  11/05/19 (!) 345 lb 9.6 oz (156.8 kg)   Physical Exam Vitals reviewed.  Constitutional:      Appearance: Normal appearance. She is obese.  Cardiovascular:      Rate and Rhythm: Normal rate and regular rhythm.     Pulses: Normal pulses.     Heart sounds: Normal heart sounds.  Pulmonary:     Effort: Pulmonary effort is normal.     Breath sounds: Normal breath sounds.  Neurological:     General: No focal deficit present.     Mental Status: She is alert and oriented to person, place, and time.  Psychiatric:        Mood and Affect: Mood normal.        Behavior: Behavior normal.     LABORATORY DATA:  I have reviewed the labs as listed.  CBC Latest Ref Rng & Units 11/26/2019 10/08/2019 09/10/2019  WBC 4.0 - 10.5 K/uL 3.2(L) 3.2(L) 4.0  Hemoglobin 12.0 - 15.0 g/dL 10.8(L) 10.9(L) 10.4(L)  Hematocrit 36 - 46 % 33.2(L) 34.3(L) 32.6(L)  Platelets 150 - 400 K/uL 316 248 212   CMP Latest Ref Rng & Units 11/26/2019 11/05/2019 10/08/2019  Glucose 70 - 99 mg/dL 119(H) 130(H) 98  BUN 8 - 23 mg/dL 25(H) 25(H) 32(H)  Creatinine 0.44 - 1.00 mg/dL 1.48(H) 1.43(H) 1.70(H)  Sodium 135 - 145 mmol/L 139 137 135  Potassium 3.5 - 5.1 mmol/L 3.2(L) 3.3(L) 3.3(L)  Chloride 98 - 111 mmol/L 96(L) 100 93(L)  CO2 22 - 32 mmol/L _0 Calcium 8.9 - 10.3 mg/dL 9.8 9.0 10.2  Total Protein 6.5 - 8.1 g/dL 7.2 6.9 7.8  Total Bilirubin 0.3 - 1.2 mg/dL 0.4 0.5 0.8  Alkaline Phos 38 - 126 U/L 44 42 44  AST 15 - 41 U/L _1 ALT 0 - 44 U/L _2 Component Value Date/Time   RBC 3.56 (L) 11/26/2019 1056   MCV 93.3 11/26/2019 1056   MCH 30.3 11/26/2019 1056   MCHC 32.5 11/26/2019 1056   RDW 14.0 11/26/2019 1056  LYMPHSABS 1.0 11/26/2019 1056   MONOABS 0.5 11/26/2019 1056   EOSABS 0.1 11/26/2019 1056   BASOSABS 0.1 11/26/2019 1056   Lab Results  Component Value Date   TOTALPROTELP 6.6 11/26/2019   ALBUMINELP 3.6 11/26/2019   A1GS 0.2 11/26/2019   A2GS 0.6 11/26/2019   BETS 1.0 11/26/2019   GAMS 1.1 11/26/2019   MSPIKE 0.3 (H) 11/26/2019   SPEI Comment 11/26/2019   Lab Results  Component Value Date   KPAFRELGTCHN 77.1 (H) 11/26/2019   LAMBDASER  36.1 (H) 11/26/2019   KAPLAMBRATIO 2.14 (H) 11/26/2019    DIAGNOSTIC IMAGING:  I have independently reviewed the scans and discussed with the patient. No results found.   ASSESSMENT:  1. Relapsed IgG kappa multiple myeloma with high risk features: -5cycles of carfilzomib, pomalidomide and dexamethasone from 03/05/2019 through 06/25/2019. -Myeloma panel on 07/09/2019 shows M spike 0.3 g, slightly up from 0.2 g previously. However free light chain ratio has 1.37 and improved. Kappa light chains are 24.8.  2. Shortness of breath on exertion: -2D echo on 02/09/2019 shows EF 60-65%. No LVH. -Chest CT PE protocol on 02/24/2019 was negative. -Cardiac MRI on 03/07/2019 shows LVEF 56% with no amyloid. Troponin T and proBNP were negative. -PFTs show low expiratory reserve volume consistent with body habitus. Moderate diffusion defect which corrects to normal. FVC, FEV1, FEV1/FVC ratio are within normal limits. FVC is reduced relative to SVC indicates air-trapping. No significant response after bronchodilators. Reduced diffusion capacity indicates moderate loss of functional alveolar capillary space. -She was evaluated by Dr.Wert. -2D echocardiogram on 08/27/2019 shows LVEF 60 to 65%. No LVH.  3. Myeloma bone disease: -Bone density on 07/04/2019 shows T score 1.8.   PLAN:  1. Relapsed IgG kappa multiple myeloma with high risk features: -Reviewed myeloma labs from 11/26/2019 which showed M spike of 0.3 g. -Kappa light chains at 77.1, lambda light chains 36.1 and ratio of 2.14. -She is tolerating pomalidomide 4 mg 3 weeks on 1 week off very well. -She continues to have partial response.  Hence we will proceed with the same pomalidomide. -RTC 12 weeks with repeat myeloma panel.  2. Shortness of breath on exertion: -Continue bronchodilators.  This has improved with diuresis.  3. ID prophylaxis: -Continue acyclovir twice daily and aspirin 81 mg daily.  4. Neuropathy: -Continue  gabapentin 300 mg 2-3 times a day.  5. Lower extremity edema: -Continue Bumex 2 mg daily.  Metolazone was discontinued.  Leg swellings with continued improvement.  6. Myeloma bone disease: -Creatinine improved to 1.48.  Continue Zometa monthly.  7. Hypokalemia: -Continue potassium supplements daily.  Potassium today is 3.2.  8.  Bilateral knee pains: -This is from arthritis. -We have started her on hydrocodone 5 mg twice daily which did not help. -We will change to Percocet 5/325 1 to 2 tablets every 12 hours as needed.  She was told to take stool softener.  Orders placed this encounter:  No orders of the defined types were placed in this encounter.    Derek Jack, MD [place] [phone]   I, Milinda Antis, am acting as a scribe for Dr. Sanda Linger.  I, Derek Jack MD, have reviewed the above documentation for accuracy and completeness, and I agree with the above.

## 2019-12-03 NOTE — Patient Instructions (Signed)
[facility] at [facility] Discharge Instructions  You were seen today by Dr. Delton Coombes. He went over your recent results. You received your Zometa injection today; continue receiving it every month. You will be prescribed Percocet for your knee pain. Dr. Delton Coombes will see you back in 3 months for labs and follow up.   Thank you for choosing [facility] at [facility] to provide your oncology and hematology care.  To afford each patient quality time with our provider, please arrive at least 15 minutes before your scheduled appointment time.   If you have a lab appointment with the [facility] please come in thru the Main Entrance and check in at the main information desk  You need to re-schedule your appointment should you arrive 10 or more minutes late.  We strive to give you quality time with our providers, and arriving late affects you and other patients whose appointments are after yours.  Also, if you no show three or more times for appointments you may be dismissed from the clinic at the providers discretion.     Again, thank you for choosing [facility].  Our hope is that these requests will decrease the amount of time that you wait before being seen by our physicians.       _____________________________________________________________  Should you have questions after your visit to [facility], please contact our office at [phone] between the hours of 8:00 a.m. and 4:30 p.m.  Voicemails left after 4:00 p.m. will not be returned until the following business day.  For prescription refill requests, have your pharmacy contact our office and allow 72 hours.    Cancer Center Support Programs:   > Cancer Support Group  2nd Tuesday of the month 1pm-2pm, Journey Room

## 2019-12-03 NOTE — Progress Notes (Signed)
1355 Labs from 11/26/19 reviewed with and pt seen by Dr. Delton Coombes and pt approved for Zometa infusion today per MD     Kelli Hurley tolerated Zometa infusion and Influenza vaccine well without complaints or incident. Calcium 9.8 on 11/26/19 and pt denied any tooth or jaw pain and no recent or future dental visits prior to administering the Zometa. VSS upon discharge. Pt discharged via wheelchair in satisfactory condition

## 2019-12-03 NOTE — Patient Instructions (Addendum)
[place] at [facility] Discharge Instructions  Received Zometa infusion as well as Influenza vaccine today. Follow-up as scheduled   Thank you for choosing [place] at [facility] to provide your oncology and hematology care.  To afford each patient quality time with our provider, please arrive at least 15 minutes before your scheduled appointment time.   If you have a lab appointment with the [place] please come in thru the Main Entrance and check in at the main information desk.  You need to re-schedule your appointment should you arrive 10 or more minutes late.  We strive to give you quality time with our providers, and arriving late affects you and other patients whose appointments are after yours.  Also, if you no show three or more times for appointments you may be dismissed from the clinic at the providers discretion.     Again, thank you for choosing [facility].  Our hope is that these requests will decrease the amount of time that you wait before being seen by our physicians.       _____________________________________________________________  Should you have questions after your visit to [facility], please contact our office at [phone] and follow the prompts.  Our office hours are 8:00 a.m. and 4:30 p.m. Monday - Friday.  Please note that voicemails left after 4:00 p.m. may not be returned until the following business day.  We are closed weekends and major holidays.  You do have access to a nurse 24-7, just call the main number to the clinic [phone] and do not press any options, hold on the line and a nurse will answer the phone.    For prescription refill requests, have your pharmacy contact our office and allow 72 hours.    Due to Covid, you will need to wear a mask upon entering the hospital. If you do not have a mask, a mask will be given to you at the Main Entrance upon arrival. For  doctor visits, patients may have 1 support person age 13 or older with them. For treatment visits, patients can not have anyone with them due to social distancing guidelines and our immunocompromised population.

## 2019-12-09 ENCOUNTER — Encounter (HOSPITAL_COMMUNITY): Payer: Self-pay

## 2019-12-09 NOTE — Progress Notes (Signed)
I have called this patient and asked her to increase her potassium tablets by 2mEq (one tablet) each time she takes her potassium per Dr. Delton Coombes. Patient verbalized understanding.

## 2019-12-15 ENCOUNTER — Other Ambulatory Visit (HOSPITAL_COMMUNITY): Payer: Self-pay

## 2019-12-15 MED ORDER — POMALIDOMIDE 4 MG PO CAPS
4.0000 mg | ORAL_CAPSULE | Freq: Every day | ORAL | 0 refills | Status: DC
Start: 2019-12-15 — End: 2020-01-08

## 2019-12-15 NOTE — Telephone Encounter (Signed)
Chart reviewed. Pomalyst refilled per Dr. Delton Coombes

## 2019-12-23 ENCOUNTER — Other Ambulatory Visit (HOSPITAL_COMMUNITY): Payer: Self-pay | Admitting: Surgery

## 2019-12-23 DIAGNOSIS — C9000 Multiple myeloma not having achieved remission: Secondary | ICD-10-CM

## 2019-12-24 ENCOUNTER — Other Ambulatory Visit (HOSPITAL_COMMUNITY): Payer: Self-pay

## 2019-12-24 ENCOUNTER — Encounter (HOSPITAL_COMMUNITY): Payer: Self-pay | Admitting: *Deleted

## 2019-12-24 ENCOUNTER — Other Ambulatory Visit (HOSPITAL_COMMUNITY): Payer: Self-pay | Admitting: *Deleted

## 2019-12-24 DIAGNOSIS — C9000 Multiple myeloma not having achieved remission: Secondary | ICD-10-CM

## 2019-12-24 MED ORDER — POTASSIUM CHLORIDE CRYS ER 20 MEQ PO TBCR: 20.0000 meq | EXTENDED_RELEASE_TABLET | Freq: Three times a day (TID) | ORAL | 4 refills | Status: DC

## 2019-12-24 MED ORDER — POTASSIUM CHLORIDE CRYS ER 20 MEQ PO TBCR: EXTENDED_RELEASE_TABLET | ORAL | 4 refills | Status: DC

## 2019-12-24 NOTE — Telephone Encounter (Signed)
Patient called stated pharmacy will not refill Potassium states new prescription needed.  Refill request processed for Potassium 20 meq twice daily per MD notes.  Patient made aware.

## 2019-12-24 NOTE — Telephone Encounter (Signed)
Per Dr. Delton Coombes, patient to take potassium 20 mEq three times daily.  I have sent a new RX to her pharmacy with updated instructions.

## 2019-12-24 NOTE — Progress Notes (Signed)
Patient called asking if she can take turmeric for knee pain.  Per Dr. Delton Coombes, patient okay to take it for pain, but if she doesn't get relief, she needs to follow up with primary care physician.

## 2019-12-31 ENCOUNTER — Inpatient Hospital Stay (HOSPITAL_COMMUNITY): Payer: Medicare Other

## 2019-12-31 ENCOUNTER — Other Ambulatory Visit: Payer: Self-pay

## 2019-12-31 ENCOUNTER — Encounter (HOSPITAL_COMMUNITY): Payer: Self-pay

## 2019-12-31 ENCOUNTER — Inpatient Hospital Stay (HOSPITAL_COMMUNITY): Payer: Medicare Other | Attending: Hematology

## 2019-12-31 VITALS — BP 120/74 | HR 74 | Temp 97.6°F | Resp 20

## 2019-12-31 DIAGNOSIS — R600 Localized edema: Secondary | ICD-10-CM | POA: Insufficient documentation

## 2019-12-31 DIAGNOSIS — E876 Hypokalemia: Secondary | ICD-10-CM | POA: Insufficient documentation

## 2019-12-31 DIAGNOSIS — R0602 Shortness of breath: Secondary | ICD-10-CM | POA: Insufficient documentation

## 2019-12-31 DIAGNOSIS — G629 Polyneuropathy, unspecified: Secondary | ICD-10-CM | POA: Diagnosis not present

## 2019-12-31 DIAGNOSIS — M25561 Pain in right knee: Secondary | ICD-10-CM | POA: Diagnosis not present

## 2019-12-31 DIAGNOSIS — C9001 Multiple myeloma in remission: Secondary | ICD-10-CM

## 2019-12-31 DIAGNOSIS — M25562 Pain in left knee: Secondary | ICD-10-CM | POA: Diagnosis not present

## 2019-12-31 DIAGNOSIS — C9000 Multiple myeloma not having achieved remission: Secondary | ICD-10-CM

## 2019-12-31 DIAGNOSIS — Z79899 Other long term (current) drug therapy: Secondary | ICD-10-CM | POA: Diagnosis not present

## 2019-12-31 LAB — CBC WITH DIFFERENTIAL/PLATELET
Band Neutrophils: 2 %
Basophils Absolute: 0.1 10*3/uL (ref 0.0–0.1)
Basophils Relative: 2 %
Eosinophils Absolute: 0.1 10*3/uL (ref 0.0–0.5)
Eosinophils Relative: 2 %
HCT: 34 % — ABNORMAL LOW (ref 36.0–46.0)
Hemoglobin: 10.9 g/dL — ABNORMAL LOW (ref 12.0–15.0)
Lymphocytes Relative: 27 %
Lymphs Abs: 1 10*3/uL (ref 0.7–4.0)
MCH: 30.3 pg (ref 26.0–34.0)
MCHC: 32.1 g/dL (ref 30.0–36.0)
MCV: 94.4 fL (ref 80.0–100.0)
Metamyelocytes Relative: 3 %
Monocytes Absolute: 0.2 10*3/uL (ref 0.1–1.0)
Monocytes Relative: 5 %
Myelocytes: 3 %
Neutro Abs: 2.1 10*3/uL (ref 1.7–7.7)
Neutrophils Relative %: 55 %
Platelets: 272 10*3/uL (ref 150–400)
Promyelocytes Relative: 1 %
RBC: 3.6 MIL/uL — ABNORMAL LOW (ref 3.87–5.11)
RDW: 14.4 % (ref 11.5–15.5)
WBC: 3.7 10*3/uL — ABNORMAL LOW (ref 4.0–10.5)
nRBC: 0 % (ref 0.0–0.2)

## 2019-12-31 LAB — COMPREHENSIVE METABOLIC PANEL
ALT: 14 U/L (ref 0–44)
AST: 18 U/L (ref 15–41)
Albumin: 3.8 g/dL (ref 3.5–5.0)
Alkaline Phosphatase: 45 U/L (ref 38–126)
Anion gap: 10 (ref 5–15)
BUN: 22 mg/dL (ref 8–23)
CO2: 29 mmol/L (ref 22–32)
Calcium: 9.9 mg/dL (ref 8.9–10.3)
Chloride: 98 mmol/L (ref 98–111)
Creatinine, Ser: 1.37 mg/dL — ABNORMAL HIGH (ref 0.44–1.00)
GFR, Estimated: 43 mL/min — ABNORMAL LOW (ref >60)
Glucose, Bld: 101 mg/dL — ABNORMAL HIGH (ref 70–99)
Potassium: 3.8 mmol/L (ref 3.5–5.1)
Sodium: 137 mmol/L (ref 135–145)
Total Bilirubin: 0.4 mg/dL (ref 0.3–1.2)
Total Protein: 7.7 g/dL (ref 6.5–8.1)

## 2019-12-31 MED ORDER — SODIUM CHLORIDE 0.9 % IV SOLN: INTRAVENOUS | Status: DC

## 2019-12-31 MED ORDER — FAMOTIDINE IN NACL 20-0.9 MG/50ML-% IV SOLN
INTRAVENOUS | Status: AC
  Filled 2019-12-31: qty 50

## 2019-12-31 MED ORDER — HEPARIN SOD (PORK) LOCK FLUSH 100 UNIT/ML IV SOLN
500.0000 [IU] | Freq: Once | INTRAVENOUS | Status: AC | PRN
  Administered 2019-12-31: 500 [IU]

## 2019-12-31 MED ORDER — SODIUM CHLORIDE 0.9% FLUSH
10.0000 mL | Freq: Once | INTRAVENOUS | Status: AC | PRN
  Administered 2019-12-31: 10 mL

## 2019-12-31 MED ORDER — ZOLEDRONIC ACID 4 MG/100ML IV SOLN
4.0000 mg | Freq: Once | INTRAVENOUS | Status: AC
  Administered 2019-12-31: 4 mg via INTRAVENOUS

## 2019-12-31 NOTE — Progress Notes (Signed)
Kelli Hurley tolerated Zometa infusion well without complaints or incident. Calcium 9.9 today and pt denied any tooth or jaw pain and no recent or future dental visits prior to administering this medication.Pt continues to take her Calcium PO as prescribed without issues VSS upon discharge. Pt discharged via wheelchair in satisfactory condition

## 2019-12-31 NOTE — Patient Instructions (Signed)
[facility] at [facility] Discharge Instructions  Labs drawn from portacath today   Thank you for choosing [facility] at [facility] to provide your oncology and hematology care.  To afford each patient quality time with our provider, please arrive at least 15 minutes before your scheduled appointment time.   If you have a lab appointment with the Cancer Center please come in thru the Main Entrance and check in at the main information desk.  You need to re-schedule your appointment should you arrive 10 or more minutes late.  We strive to give you quality time with our providers, and arriving late affects you and other patients whose appointments are after yours.  Also, if you no show three or more times for appointments you may be dismissed from the clinic at the providers discretion.     Again, thank you for choosing [facility].  Our hope is that these requests will decrease the amount of time that you wait before being seen by our physicians.       _____________________________________________________________  Should you have questions after your visit to  Cancer Center, please contact our office at [phone] and follow the prompts.  Our office hours are 8:00 a.m. and 4:30 p.m. Monday - Friday.  Please note that voicemails left after 4:00 p.m. may not be returned until the following business day.  We are closed weekends and major holidays.  You do have access to a nurse 24-7, just call the main number to the clinic [phone] and do not press any options, hold on the line and a nurse will answer the phone.    For prescription refill requests, have your pharmacy contact our office and allow 72 hours.    Due to Covid, you will need to wear a mask upon entering the hospital. If you do not have a mask, a mask will be given to you at the Main Entrance upon arrival. For doctor visits, patients may have 1 support person age 18  or older with them. For treatment visits, patients can not have anyone with them due to social distancing guidelines and our immunocompromised population.     

## 2019-12-31 NOTE — Patient Instructions (Signed)
[place] at [facility] Discharge Instructions  Received Zometa infusion today. Follow-up as scheduled   Thank you for choosing [place] at [facility] to provide your oncology and hematology care.  To afford each patient quality time with our provider, please arrive at least 15 minutes before your scheduled appointment time.   If you have a lab appointment with the [place] please come in thru the Main Entrance and check in at the main information desk.  You need to re-schedule your appointment should you arrive 10 or more minutes late.  We strive to give you quality time with our providers, and arriving late affects you and other patients whose appointments are after yours.  Also, if you no show three or more times for appointments you may be dismissed from the clinic at the providers discretion.     Again, thank you for choosing [facility].  Our hope is that these requests will decrease the amount of time that you wait before being seen by our physicians.       _____________________________________________________________  Should you have questions after your visit to [facility], please contact our office at [phone] and follow the prompts.  Our office hours are 8:00 a.m. and 4:30 p.m. Monday - Friday.  Please note that voicemails left after 4:00 p.m. may not be returned until the following business day.  We are closed weekends and major holidays.  You do have access to a nurse 24-7, just call the main number to the clinic [phone] and do not press any options, hold on the line and a nurse will answer the phone.    For prescription refill requests, have your pharmacy contact our office and allow 72 hours.    Due to Covid, you will need to wear a mask upon entering the hospital. If you do not have a mask, a mask will be given to you at the Main Entrance upon arrival. For doctor visits, patients may have 1  support person age 31 or older with them. For treatment visits, patients can not have anyone with them due to social distancing guidelines and our immunocompromised population.

## 2020-01-08 ENCOUNTER — Other Ambulatory Visit (HOSPITAL_COMMUNITY): Payer: Self-pay

## 2020-01-08 MED ORDER — POMALIDOMIDE 4 MG PO CAPS
4.0000 mg | ORAL_CAPSULE | Freq: Every day | ORAL | 0 refills | Status: DC
Start: 2020-01-08 — End: 2020-02-16

## 2020-01-08 NOTE — Telephone Encounter (Signed)
Chart reviewed. Patient's Pomalyst refilled per Dr. Delton Coombes.

## 2020-01-12 ENCOUNTER — Other Ambulatory Visit (HOSPITAL_COMMUNITY): Payer: Self-pay | Admitting: Hematology

## 2020-01-14 ENCOUNTER — Other Ambulatory Visit (HOSPITAL_COMMUNITY): Payer: Self-pay | Admitting: *Deleted

## 2020-01-14 ENCOUNTER — Telehealth (HOSPITAL_COMMUNITY): Payer: Self-pay | Admitting: *Deleted

## 2020-01-14 MED ORDER — OXYCODONE-ACETAMINOPHEN 5-325 MG PO TABS
1.0000 | ORAL_TABLET | Freq: Two times a day (BID) | ORAL | 0 refills | Status: DC | PRN
Start: 2020-01-14 — End: 2020-02-18

## 2020-01-15 ENCOUNTER — Other Ambulatory Visit (HOSPITAL_COMMUNITY): Payer: Self-pay

## 2020-01-15 MED ORDER — HYDROCODONE-ACETAMINOPHEN 5-325 MG PO TABS: 1.0000 | ORAL_TABLET | Freq: Two times a day (BID) | ORAL | 0 refills | Status: DC | PRN

## 2020-01-28 ENCOUNTER — Inpatient Hospital Stay (HOSPITAL_COMMUNITY): Payer: Medicare Other

## 2020-01-28 ENCOUNTER — Inpatient Hospital Stay (HOSPITAL_COMMUNITY): Payer: Medicare Other | Attending: Medical

## 2020-01-28 ENCOUNTER — Encounter (HOSPITAL_COMMUNITY): Payer: Self-pay

## 2020-01-28 VITALS — BP 128/67 | HR 77 | Resp 20

## 2020-01-28 DIAGNOSIS — Z79899 Other long term (current) drug therapy: Secondary | ICD-10-CM | POA: Diagnosis not present

## 2020-01-28 DIAGNOSIS — M25562 Pain in left knee: Secondary | ICD-10-CM | POA: Diagnosis not present

## 2020-01-28 DIAGNOSIS — R200 Anesthesia of skin: Secondary | ICD-10-CM | POA: Insufficient documentation

## 2020-01-28 DIAGNOSIS — M2550 Pain in unspecified joint: Secondary | ICD-10-CM | POA: Insufficient documentation

## 2020-01-28 DIAGNOSIS — Z8249 Family history of ischemic heart disease and other diseases of the circulatory system: Secondary | ICD-10-CM | POA: Diagnosis not present

## 2020-01-28 DIAGNOSIS — R0602 Shortness of breath: Secondary | ICD-10-CM | POA: Diagnosis not present

## 2020-01-28 DIAGNOSIS — R5383 Other fatigue: Secondary | ICD-10-CM | POA: Diagnosis not present

## 2020-01-28 DIAGNOSIS — M25561 Pain in right knee: Secondary | ICD-10-CM | POA: Diagnosis not present

## 2020-01-28 DIAGNOSIS — C9000 Multiple myeloma not having achieved remission: Secondary | ICD-10-CM | POA: Insufficient documentation

## 2020-01-28 DIAGNOSIS — E876 Hypokalemia: Secondary | ICD-10-CM | POA: Diagnosis not present

## 2020-01-28 DIAGNOSIS — Z809 Family history of malignant neoplasm, unspecified: Secondary | ICD-10-CM | POA: Diagnosis not present

## 2020-01-28 DIAGNOSIS — Z8042 Family history of malignant neoplasm of prostate: Secondary | ICD-10-CM | POA: Insufficient documentation

## 2020-01-28 DIAGNOSIS — C9001 Multiple myeloma in remission: Secondary | ICD-10-CM

## 2020-01-28 LAB — COMPREHENSIVE METABOLIC PANEL
ALT: 13 U/L (ref 0–44)
AST: 15 U/L (ref 15–41)
Albumin: 3.8 g/dL (ref 3.5–5.0)
Alkaline Phosphatase: 47 U/L (ref 38–126)
Anion gap: 11 (ref 5–15)
BUN: 25 mg/dL — ABNORMAL HIGH (ref 8–23)
CO2: 28 mmol/L (ref 22–32)
Calcium: 9.7 mg/dL (ref 8.9–10.3)
Chloride: 98 mmol/L (ref 98–111)
Creatinine, Ser: 1.7 mg/dL — ABNORMAL HIGH (ref 0.44–1.00)
GFR, Estimated: 33 mL/min — ABNORMAL LOW (ref >60)
Glucose, Bld: 108 mg/dL — ABNORMAL HIGH (ref 70–99)
Potassium: 3.6 mmol/L (ref 3.5–5.1)
Sodium: 137 mmol/L (ref 135–145)
Total Bilirubin: 0.4 mg/dL (ref 0.3–1.2)
Total Protein: 7.3 g/dL (ref 6.5–8.1)

## 2020-01-28 MED ORDER — SODIUM CHLORIDE 0.9 % IV SOLN: INTRAVENOUS | Status: DC

## 2020-01-28 MED ORDER — ZOLEDRONIC ACID 4 MG/100ML IV SOLN
4.0000 mg | Freq: Once | INTRAVENOUS | Status: AC
  Administered 2020-01-28: 13:00:00 4 mg via INTRAVENOUS
  Filled 2020-01-28: qty 100

## 2020-01-28 MED ORDER — SODIUM CHLORIDE 0.9% FLUSH
10.0000 mL | INTRAVENOUS | Status: DC | PRN
  Administered 2020-01-28: 12:00:00 10 mL via INTRAVENOUS

## 2020-01-28 MED ORDER — HEPARIN SOD (PORK) LOCK FLUSH 100 UNIT/ML IV SOLN
500.0000 [IU] | Freq: Once | INTRAVENOUS | Status: AC
  Administered 2020-01-28: 14:00:00 500 [IU] via INTRAVENOUS

## 2020-01-28 NOTE — Patient Instructions (Signed)
[facility] at [facility] Discharge Instructions  Received Zometa infusion today. Follow-up as scheduled   Thank you for choosing [facility] at [facility] to provide your oncology and hematology care.  To afford each patient quality time with our provider, please arrive at least 15 minutes before your scheduled appointment time.   If you have a lab appointment with the Cancer Center please come in thru the Main Entrance and check in at the main information desk.  You need to re-schedule your appointment should you arrive 10 or more minutes late.  We strive to give you quality time with our providers, and arriving late affects you and other patients whose appointments are after yours.  Also, if you no show three or more times for appointments you may be dismissed from the clinic at the providers discretion.     Again, thank you for choosing [facility].  Our hope is that these requests will decrease the amount of time that you wait before being seen by our physicians.       _____________________________________________________________  Should you have questions after your visit to [facility], please contact our office at [phone] and follow the prompts.  Our office hours are 8:00 a.m. and 4:30 p.m. Monday - Friday.  Please note that voicemails left after 4:00 p.m. may not be returned until the following business day.  We are closed weekends and major holidays.  You do have access to a nurse 24-7, just call the main number to the clinic [phone] and do not press any options, hold on the line and a nurse will answer the phone.    For prescription refill requests, have your pharmacy contact our office and allow 72 hours.    Due to Covid, you will need to wear a mask upon entering the hospital. If you do not have a mask, a mask will be given to you at the Main Entrance upon arrival. For doctor visits, patients may have 1  support person age 18 or older with them. For treatment visits, patients can not have anyone with them due to social distancing guidelines and our immunocompromised population.     

## 2020-01-28 NOTE — Progress Notes (Signed)
Haywood Filler Irani tolerated Zometa infusion and hydration well without complaints or incident. Labs reviewed with [facility] PA prior to administering the Zometa and pt approved for Zometa 4 mg IV with IV hydration of NS 500 ml over 30-45 mins added per PA due to Creatinine of 1.7. Calcium 9.7 today and pt denied any tooth or jaw pain and no recent or future dental visits prior to administering this medication.Pt continues to take her Calcium PO as prescribed without issues. VSS upon discharge. Pt discharged via wheelchair in satisfactory condition.

## 2020-01-28 NOTE — Patient Instructions (Signed)
[facility] at [facility] Discharge Instructions  Labs drawn from portacath today   Thank you for choosing [facility] at [facility] to provide your oncology and hematology care.  To afford each patient quality time with our provider, please arrive at least 15 minutes before your scheduled appointment time.   If you have a lab appointment with the Cancer Center please come in thru the Main Entrance and check in at the main information desk.  You need to re-schedule your appointment should you arrive 10 or more minutes late.  We strive to give you quality time with our providers, and arriving late affects you and other patients whose appointments are after yours.  Also, if you no show three or more times for appointments you may be dismissed from the clinic at the providers discretion.     Again, thank you for choosing [facility].  Our hope is that these requests will decrease the amount of time that you wait before being seen by our physicians.       _____________________________________________________________  Should you have questions after your visit to  Cancer Center, please contact our office at [phone] and follow the prompts.  Our office hours are 8:00 a.m. and 4:30 p.m. Monday - Friday.  Please note that voicemails left after 4:00 p.m. may not be returned until the following business day.  We are closed weekends and major holidays.  You do have access to a nurse 24-7, just call the main number to the clinic [phone] and do not press any options, hold on the line and a nurse will answer the phone.    For prescription refill requests, have your pharmacy contact our office and allow 72 hours.    Due to Covid, you will need to wear a mask upon entering the hospital. If you do not have a mask, a mask will be given to you at the Main Entrance upon arrival. For doctor visits, patients may have 1 support person age 18  or older with them. For treatment visits, patients can not have anyone with them due to social distancing guidelines and our immunocompromised population.     

## 2020-02-15 ENCOUNTER — Other Ambulatory Visit (HOSPITAL_COMMUNITY): Payer: Self-pay | Admitting: Hematology

## 2020-02-16 ENCOUNTER — Other Ambulatory Visit (HOSPITAL_COMMUNITY): Payer: Self-pay

## 2020-02-16 MED ORDER — POMALIDOMIDE 4 MG PO CAPS: 4.0000 mg | ORAL_CAPSULE | Freq: Every day | ORAL | 0 refills | Status: DC

## 2020-02-16 NOTE — Telephone Encounter (Signed)
Chart reviewed. Pomalyst refilled per Dr. Delton Coombes

## 2020-02-18 ENCOUNTER — Other Ambulatory Visit (HOSPITAL_COMMUNITY): Payer: Self-pay | Admitting: *Deleted

## 2020-02-18 ENCOUNTER — Other Ambulatory Visit: Payer: Self-pay

## 2020-02-18 ENCOUNTER — Inpatient Hospital Stay (HOSPITAL_COMMUNITY): Payer: Medicare Other | Attending: Hematology

## 2020-02-18 DIAGNOSIS — Z8249 Family history of ischemic heart disease and other diseases of the circulatory system: Secondary | ICD-10-CM | POA: Diagnosis not present

## 2020-02-18 DIAGNOSIS — G629 Polyneuropathy, unspecified: Secondary | ICD-10-CM | POA: Insufficient documentation

## 2020-02-18 DIAGNOSIS — Z809 Family history of malignant neoplasm, unspecified: Secondary | ICD-10-CM | POA: Insufficient documentation

## 2020-02-18 DIAGNOSIS — Z8042 Family history of malignant neoplasm of prostate: Secondary | ICD-10-CM | POA: Insufficient documentation

## 2020-02-18 DIAGNOSIS — M2550 Pain in unspecified joint: Secondary | ICD-10-CM | POA: Insufficient documentation

## 2020-02-18 DIAGNOSIS — E876 Hypokalemia: Secondary | ICD-10-CM | POA: Diagnosis not present

## 2020-02-18 DIAGNOSIS — E669 Obesity, unspecified: Secondary | ICD-10-CM | POA: Diagnosis not present

## 2020-02-18 DIAGNOSIS — R0602 Shortness of breath: Secondary | ICD-10-CM | POA: Diagnosis not present

## 2020-02-18 DIAGNOSIS — R5383 Other fatigue: Secondary | ICD-10-CM | POA: Insufficient documentation

## 2020-02-18 DIAGNOSIS — R600 Localized edema: Secondary | ICD-10-CM | POA: Diagnosis not present

## 2020-02-18 DIAGNOSIS — Z79899 Other long term (current) drug therapy: Secondary | ICD-10-CM | POA: Insufficient documentation

## 2020-02-18 DIAGNOSIS — C9000 Multiple myeloma not having achieved remission: Secondary | ICD-10-CM | POA: Insufficient documentation

## 2020-02-18 DIAGNOSIS — Z95828 Presence of other vascular implants and grafts: Secondary | ICD-10-CM

## 2020-02-18 DIAGNOSIS — Z8719 Personal history of other diseases of the digestive system: Secondary | ICD-10-CM | POA: Diagnosis not present

## 2020-02-18 LAB — CBC WITH DIFFERENTIAL/PLATELET
Abs Immature Granulocytes: 0.01 10*3/uL (ref 0.00–0.07)
Basophils Absolute: 0 10*3/uL (ref 0.0–0.1)
Basophils Relative: 1 %
Eosinophils Absolute: 0 10*3/uL (ref 0.0–0.5)
Eosinophils Relative: 1 %
HCT: 33.6 % — ABNORMAL LOW (ref 36.0–46.0)
Hemoglobin: 10.8 g/dL — ABNORMAL LOW (ref 12.0–15.0)
Immature Granulocytes: 1 %
Lymphocytes Relative: 42 %
Lymphs Abs: 0.9 10*3/uL (ref 0.7–4.0)
MCH: 30.4 pg (ref 26.0–34.0)
MCHC: 32.1 g/dL (ref 30.0–36.0)
MCV: 94.6 fL (ref 80.0–100.0)
Monocytes Absolute: 0.5 10*3/uL (ref 0.1–1.0)
Monocytes Relative: 25 %
Neutro Abs: 0.6 10*3/uL — ABNORMAL LOW (ref 1.7–7.7)
Neutrophils Relative %: 30 %
Platelets: 247 10*3/uL (ref 150–400)
RBC: 3.55 MIL/uL — ABNORMAL LOW (ref 3.87–5.11)
RDW: 14 % (ref 11.5–15.5)
WBC: 2.1 10*3/uL — ABNORMAL LOW (ref 4.0–10.5)
nRBC: 0 % (ref 0.0–0.2)

## 2020-02-18 LAB — COMPREHENSIVE METABOLIC PANEL
ALT: 13 U/L (ref 0–44)
AST: 19 U/L (ref 15–41)
Albumin: 4 g/dL (ref 3.5–5.0)
Alkaline Phosphatase: 47 U/L (ref 38–126)
Anion gap: 10 (ref 5–15)
BUN: 29 mg/dL — ABNORMAL HIGH (ref 8–23)
CO2: 30 mmol/L (ref 22–32)
Calcium: 9.6 mg/dL (ref 8.9–10.3)
Chloride: 96 mmol/L — ABNORMAL LOW (ref 98–111)
Creatinine, Ser: 1.73 mg/dL — ABNORMAL HIGH (ref 0.44–1.00)
GFR, Estimated: 33 mL/min — ABNORMAL LOW (ref >60)
Glucose, Bld: 98 mg/dL (ref 70–99)
Potassium: 3.2 mmol/L — ABNORMAL LOW (ref 3.5–5.1)
Sodium: 136 mmol/L (ref 135–145)
Total Bilirubin: 0.7 mg/dL (ref 0.3–1.2)
Total Protein: 7.9 g/dL (ref 6.5–8.1)

## 2020-02-18 MED ORDER — METOLAZONE 2.5 MG PO TABS: 2.5000 mg | ORAL_TABLET | Freq: Every day | ORAL | 2 refills | Status: DC

## 2020-02-18 MED ORDER — SODIUM CHLORIDE 0.9% FLUSH
10.0000 mL | INTRAVENOUS | Status: DC | PRN
  Administered 2020-02-18: 10 mL via INTRAVENOUS

## 2020-02-18 MED ORDER — HEPARIN SOD (PORK) LOCK FLUSH 100 UNIT/ML IV SOLN
500.0000 [IU] | Freq: Once | INTRAVENOUS | Status: AC
  Administered 2020-02-18: 500 [IU] via INTRAVENOUS

## 2020-02-18 MED ORDER — OXYCODONE-ACETAMINOPHEN 5-325 MG PO TABS: 1.0000 | ORAL_TABLET | Freq: Two times a day (BID) | ORAL | 0 refills | Status: DC | PRN

## 2020-02-18 MED ORDER — ALBUTEROL SULFATE HFA 108 (90 BASE) MCG/ACT IN AERS: 2.0000 | INHALATION_SPRAY | Freq: Four times a day (QID) | RESPIRATORY_TRACT | 3 refills | Status: AC | PRN

## 2020-02-18 NOTE — Progress Notes (Signed)
Patient was here today for lab work but wanted to have it drawn through her port.  She arrived via wheelchair.  Port was accessed and labs drawn without difficulty.  Band-aid applied.  Patient was discharged in stable condition via wheelchair with self.  She is aware of her upcoming appointments.

## 2020-02-18 NOTE — Patient Instructions (Signed)
You were here today for labs that were drawn from your port. Follow up next week as scheduled.

## 2020-02-19 LAB — KAPPA/LAMBDA LIGHT CHAINS
Kappa free light chain: 127 mg/L — ABNORMAL HIGH (ref 3.3–19.4)
Kappa, lambda light chain ratio: 2.44 — ABNORMAL HIGH (ref 0.26–1.65)
Lambda free light chains: 52 mg/L — ABNORMAL HIGH (ref 5.7–26.3)

## 2020-02-20 LAB — PROTEIN ELECTROPHORESIS, SERUM
A/G Ratio: 1.1 (ref 0.7–1.7)
Albumin ELP: 3.9 g/dL (ref 2.9–4.4)
Alpha-1-Globulin: 0.2 g/dL (ref 0.0–0.4)
Alpha-2-Globulin: 0.6 g/dL (ref 0.4–1.0)
Beta Globulin: 1.1 g/dL (ref 0.7–1.3)
Gamma Globulin: 1.5 g/dL (ref 0.4–1.8)
Globulin, Total: 3.4 g/dL (ref 2.2–3.9)
M-Spike, %: 0.3 g/dL — ABNORMAL HIGH
Total Protein ELP: 7.3 g/dL (ref 6.0–8.5)

## 2020-02-25 ENCOUNTER — Inpatient Hospital Stay (HOSPITAL_BASED_OUTPATIENT_CLINIC_OR_DEPARTMENT_OTHER): Payer: Medicare Other | Admitting: Hematology

## 2020-02-25 ENCOUNTER — Inpatient Hospital Stay (HOSPITAL_COMMUNITY): Payer: Medicare Other

## 2020-02-25 ENCOUNTER — Other Ambulatory Visit: Payer: Self-pay

## 2020-02-25 VITALS — BP 136/68 | HR 83 | Temp 96.8°F | Resp 20 | Wt 335.0 lb

## 2020-02-25 DIAGNOSIS — C9000 Multiple myeloma not having achieved remission: Secondary | ICD-10-CM | POA: Diagnosis not present

## 2020-02-25 DIAGNOSIS — C9001 Multiple myeloma in remission: Secondary | ICD-10-CM

## 2020-02-25 MED ORDER — ZOLEDRONIC ACID 4 MG/100ML IV SOLN: 4.0000 mg | Freq: Once | INTRAVENOUS | Status: DC

## 2020-02-25 MED ORDER — SODIUM CHLORIDE 0.9 % IV SOLN: INTRAVENOUS | Status: DC

## 2020-02-25 MED ORDER — HEPARIN SOD (PORK) LOCK FLUSH 100 UNIT/ML IV SOLN
500.0000 [IU] | Freq: Once | INTRAVENOUS | Status: AC | PRN
  Administered 2020-02-25: 500 [IU]

## 2020-02-25 MED ORDER — SODIUM CHLORIDE 0.9% FLUSH
10.0000 mL | Freq: Once | INTRAVENOUS | Status: AC | PRN
  Administered 2020-02-25: 10 mL

## 2020-02-25 NOTE — Patient Instructions (Signed)
[place] at [facility] Discharge Instructions  You were seen today by Dr. Delton Coombes. He went over your recent results. You will be referred to [place] to get another bone marrow biopsy before your next visit. Do not start the next bottle of Pomalyst until you return to see Dr. Delton Coombes. Dr. Delton Coombes will see you back in 3 weeks for labs and follow up.   Thank you for choosing [place] at [facility] to provide your oncology and hematology care.  To afford each patient quality time with our provider, please arrive at least 15 minutes before your scheduled appointment time.   If you have a lab appointment with the [place] please come in thru the Main Entrance and check in at the main information desk  You need to re-schedule your appointment should you arrive 10 or more minutes late.  We strive to give you quality time with our providers, and arriving late affects you and other patients whose appointments are after yours.  Also, if you no show three or more times for appointments you may be dismissed from the clinic at the providers discretion.     Again, thank you for choosing [facility].  Our hope is that these requests will decrease the amount of time that you wait before being seen by our physicians.       _____________________________________________________________  Should you have questions after your visit to [facility], please contact our office at [phone] between the hours of 8:00 a.m. and 4:30 p.m.  Voicemails left after 4:00 p.m. will not be returned until the following business day.  For prescription refill requests, have your pharmacy contact our office and allow 72 hours.    Cancer Center Support Programs:   > Cancer Support Group  2nd Tuesday of the month 1pm-2pm, Journey Room

## 2020-02-25 NOTE — Progress Notes (Signed)
Patient was assessed by Dr. Delton Coombes and labs have been reviewed.  Potassium was 3.2 last week but patient had taken her potassium later in the am.  No orders at this time for her potassium.  Patient is okay to proceed with zometa today. Primary RN and pharmacy aware.

## 2020-02-25 NOTE — Progress Notes (Signed)
Hold zometa today due to elevated scr 1.73.  T.O. Dr Jim Desanctis, RN/Wylee Ronnald Ramp, PharmD

## 2020-02-25 NOTE — Patient Instructions (Signed)
[facility] at [facility] Discharge Instructions  Received Zometa infusion today. Follow-up as scheduled   Thank you for choosing [facility] at [facility] to provide your oncology and hematology care.  To afford each patient quality time with our provider, please arrive at least 15 minutes before your scheduled appointment time.   If you have a lab appointment with the Cancer Center please come in thru the Main Entrance and check in at the main information desk.  You need to re-schedule your appointment should you arrive 10 or more minutes late.  We strive to give you quality time with our providers, and arriving late affects you and other patients whose appointments are after yours.  Also, if you no show three or more times for appointments you may be dismissed from the clinic at the providers discretion.     Again, thank you for choosing [facility].  Our hope is that these requests will decrease the amount of time that you wait before being seen by our physicians.       _____________________________________________________________  Should you have questions after your visit to  Cancer Center, please contact our office at [phone] and follow the prompts.  Our office hours are 8:00 a.m. and 4:30 p.m. Monday - Friday.  Please note that voicemails left after 4:00 p.m. may not be returned until the following business day.  We are closed weekends and major holidays.  You do have access to a nurse 24-7, just call the main number to the clinic [phone] and do not press any options, hold on the line and a nurse will answer the phone.    For prescription refill requests, have your pharmacy contact our office and allow 72 hours.    Due to Covid, you will need to wear a mask upon entering the hospital. If you do not have a mask, a mask will be given to you at the Main Entrance upon arrival. For doctor visits, patients may have 1  support person age 18 or older with them. For treatment visits, patients can not have anyone with them due to social distancing guidelines and our immunocompromised population.     

## 2020-02-25 NOTE — Progress Notes (Signed)
1325 Labs from 02/18/20 reviewed with and pt seen by Dr. Delton Coombes and pt approved for Zometa infusion today per MD 1340 Zometa to be held today due to elevated creatinine per MD 1345 Pt discharged via wheelchair in satisfactory condition

## 2020-02-25 NOTE — Progress Notes (Signed)
[facility] [address], [place] [address]   CLINIC:  Medical Oncology/Hematology  PCP:  Alfonse Flavors, MD [address] [place] / [place] [address]  [phone]  REASON FOR VISIT:  Follow-up for multiple myeloma  PRIOR THERAPY:  1. Bortezomib x 8 cycles from 10/05/2014 to 04/06/2016. 2. Carfilzomib x 6 cycles from 03/05/2019 to 08/06/2019.  CURRENT THERAPY: Pomalyst 3 weeks on, 1 week off  INTERVAL HISTORY:  [name], a 65 y.o. female, returns for routine follow-up for her multiple myeloma. Kelli Hurley was last seen on 12/03/2019.  Today she reports feeling well. She started Pomalyst on evening of 1/12. She is taking metolazone daily and denies having any SOB, orthopnea or leg swelling; she stopped taking Bumex. She denies abdominal pain or N/V. She is taking Percocet BID for her knee pains and it is well controlled. The numbness in her right hand is stable. She takes 2 tablets of potassium 20 mEq in the morning and 1 tablet in the evening.   REVIEW OF SYSTEMS:  Review of Systems  Constitutional: Positive for fatigue (75%). Negative for appetite change.  Respiratory: Negative for shortness of breath.   Cardiovascular: Negative for leg swelling.  Gastrointestinal: Negative for abdominal pain, nausea and vomiting.  Musculoskeletal: Positive for arthralgias (8/10 bilat knees pain). Negative for myalgias.  Neurological: Positive for numbness (R hand; stable).  All other systems reviewed and are negative.   PAST MEDICAL/SURGICAL HISTORY:  Past Medical History:  Diagnosis Date  . Anemia   . Arthritis   . Cancer ([place])   . Claustrophobia 10/05/2014  . Hypertension   . Leukopenia 08/03/2014  . Normocytic hypochromic anemia 08/03/2014  . Renal disorder    stage 3    Past Surgical History:  Procedure Laterality Date  . ABDOMINAL HYSTERECTOMY    . CARDIAC SURGERY     41 months old. States she had a leaky valve.   . COLONOSCOPY   03/26/2009   Dr. Oneida Alar; normal colon, small internal hemorrhoids.  Recommended repeat colonoscopy in 10 years.  . COLONOSCOPY WITH PROPOFOL N/A 11/25/2019   Procedure: COLONOSCOPY WITH PROPOFOL;  Surgeon: Eloise Harman, DO;  Location: [facility];  Service: Endoscopy;  Laterality: N/A;  10:45am  . OTHER SURGICAL HISTORY     heart surgery as infant to "repair hole in heart"  . PORTACATH PLACEMENT Left 02/21/2019   Procedure: INSERTION PORT-A-CATH;  Surgeon: Virl Cagey, MD;  Location: [facility];  Service: General;  Laterality: Left;    SOCIAL HISTORY:  Social History   Socioeconomic History  . Marital status: Legally Separated    Spouse name: Not on file  . Number of children: Not on file  . Years of education: Not on file  . Highest education level: Not on file  Occupational History  . Not on file  Tobacco Use  . Smoking status: Never Smoker  . Smokeless tobacco: Never Used  Vaping Use  . Vaping Use: Never used  Substance and Sexual Activity  . Alcohol use: No  . Drug use: No  . Sexual activity: Not on file    Comment: divorced- 2 daughters  Other Topics Concern  . Not on file  Social History Narrative  . Not on file   Social Determinants of Health   Financial Resource Strain: Not on file  Food Insecurity: Not on file  Transportation Needs: Not on file  Physical Activity: Not on file  Stress: Not on file  Social Connections: Not on file  Intimate Partner Violence: Not on file    FAMILY HISTORY:  Family History  Problem Relation Age of Onset  . Cancer Mother   . Hypertension Mother   . Cancer Father   . Hypertension Father   . Cancer Maternal Grandmother   . Hypertension Sister   . Hypertension Brother   . Prostate cancer Brother   . Colon cancer Neg Hx   . Colon polyps Neg Hx     CURRENT MEDICATIONS:  Current Outpatient Medications  Medication Sig Dispense Refill  . acetaminophen (TYLENOL) 650 MG CR tablet Take 650 mg by mouth every 8 (eight)  hours as needed for pain.     Marland Kitchen acyclovir (ZOVIRAX) 400 MG tablet Take 1 tablet (400 mg total) by mouth 2 (two) times daily. 60 tablet 6  . Ascorbic Acid (VITAMIN C) 1000 MG tablet Take 1,000 mg by mouth daily.     Marland Kitchen aspirin EC 81 MG tablet Take 81 mg by mouth daily.    . calcium carbonate (OS-CAL) 1250 (500 Ca) MG chewable tablet Chew 1 tablet by mouth daily.     . cholecalciferol (VITAMIN D) 1000 units tablet Take 1,000 Units by mouth daily.    Marland Kitchen gabapentin (NEURONTIN) 300 MG capsule TAKE (1) CAPSULE BY MOUTH TWICE DAILY. (Patient taking differently: Take 300 mg by mouth 2 (two) times daily.) 30 capsule 6  . HYDROcodone-acetaminophen (NORCO/VICODIN) 5-325 MG tablet Take 1 tablet by mouth every 12 (twelve) hours as needed for moderate pain. 30 tablet 0  . lidocaine-prilocaine (EMLA) cream Apply a quarter sized glob 1 hour prior to treatment. (Patient taking differently: Apply 1 application topically daily as needed (prior to port access).) 30 g 5  . magnesium oxide (MAG-OX) 400 (241.3 Mg) MG tablet TAKE 1 TABLET BY MOUTH TWICE DAILY 60 tablet 6  . metolazone (ZAROXOLYN) 2.5 MG tablet Take 1 tablet (2.5 mg total) by mouth daily. 30 tablet 2  . Multiple Vitamin (TAB-A-VITE) TABS TAKE 1 TABLET BY MOUTH ONCE DAILY. (Patient taking differently: Take 1 tablet by mouth daily.) 30 tablet 11  . oxyCODONE-acetaminophen (PERCOCET/ROXICET) 5-325 MG tablet Take 1-2 tablets by mouth every 12 (twelve) hours as needed for severe pain. 60 tablet 0  . pomalidomide (POMALYST) 4 MG capsule Take 1 capsule (4 mg total) by mouth daily. 21 capsule 0  . potassium chloride SA (KLOR-CON) 20 MEQ tablet Take 1 tablet (20 mEq total) by mouth 3 (three) times daily. 90 tablet 4  . trolamine salicylate (ASPERCREME) 10 % cream Apply 1 application topically 2 (two) times daily as needed for muscle pain.     Marland Kitchen albuterol (VENTOLIN HFA) 108 (90 Base) MCG/ACT inhaler Inhale 2 puffs into the lungs every 6 (six) hours as needed for  wheezing or shortness of breath. (Patient not taking: Reported on 02/25/2020) 8 g 3   No current facility-administered medications for this visit.   Facility-Administered Medications Ordered in Other Visits  Medication Dose Route Frequency Provider Last Rate Last Admin  . heparin lock flush 100 unit/mL  500 Units Intravenous Once Penland, Larene Beach K, MD      . sodium chloride 0.9 % injection 10 mL  10 mL Intravenous Once Penland, Kelby Fam, MD        ALLERGIES:  Allergies  Allergen Reactions  . Diclofenac Swelling    Per pt, facial swelling    PHYSICAL EXAM:  Performance status (ECOG): 1 - Symptomatic but completely ambulatory  Vitals:   02/25/20 1243  BP: 136/68  Pulse: 83  Resp: 20  Temp: (!) 96.8 F (36 C)  SpO2: 97%   Wt Readings from Last 3 Encounters:  02/25/20 (!) 335 lb (152 kg)  01/28/20 (!) 339 lb 9.6 oz (154 kg)  12/31/19 (!) 341 lb 3.2 oz (154.8 kg)   Physical Exam Vitals reviewed.  Constitutional:      Appearance: Normal appearance. She is obese.  Neurological:     General: No focal deficit present.     Mental Status: She is alert and oriented to person, place, and time.  Psychiatric:        Mood and Affect: Mood normal.        Behavior: Behavior normal.     LABORATORY DATA:  I have reviewed the labs as listed.  CBC Latest Ref Rng & Units 02/18/2020 12/31/2019 11/26/2019  WBC 4.0 - 10.5 K/uL 2.1(L) 3.7(L) 3.2(L)  Hemoglobin 12.0 - 15.0 g/dL 10.8(L) 10.9(L) 10.8(L)  Hematocrit 36.0 - 46.0 % 33.6(L) 34.0(L) 33.2(L)  Platelets 150 - 400 K/uL 247 272 316   CMP Latest Ref Rng & Units 02/18/2020 01/28/2020 12/31/2019  Glucose 70 - 99 mg/dL 98 108(H) 101(H)  BUN 8 - 23 mg/dL 29(H) 25(H) 22  Creatinine 0.44 - 1.00 mg/dL 1.73(H) 1.70(H) 1.37(H)  Sodium 135 - 145 mmol/L 136 137 137  Potassium 3.5 - 5.1 mmol/L 3.2(L) 3.6 3.8  Chloride 98 - 111 mmol/L 96(L) 98 98  CO2 22 - 32 mmol/L 30 28 29   Calcium 8.9 - 10.3 mg/dL 9.6 9.7 9.9  Total Protein 6.5 - 8.1  g/dL 7.9 7.3 7.7  Total Bilirubin 0.3 - 1.2 mg/dL 0.7 0.4 0.4  Alkaline Phos 38 - 126 U/L 47 47 45  AST 15 - 41 U/L 19 15 18   ALT 0 - 44 U/L 13 13 14       Component Value Date/Time   RBC 3.55 (L) 02/18/2020 1125   MCV 94.6 02/18/2020 1125   MCH 30.4 02/18/2020 1125   MCHC 32.1 02/18/2020 1125   RDW 14.0 02/18/2020 1125   LYMPHSABS 0.9 02/18/2020 1125   MONOABS 0.5 02/18/2020 1125   EOSABS 0.0 02/18/2020 1125   BASOSABS 0.0 02/18/2020 1125   Lab Results  Component Value Date   TOTALPROTELP 7.3 02/18/2020   ALBUMINELP 3.9 02/18/2020   A1GS 0.2 02/18/2020   A2GS 0.6 02/18/2020   BETS 1.1 02/18/2020   GAMS 1.5 02/18/2020   MSPIKE 0.3 (H) 02/18/2020   SPEI Comment 02/18/2020    Lab Results  Component Value Date   KPAFRELGTCHN 127.0 (H) 02/18/2020   LAMBDASER 52.0 (H) 02/18/2020   KAPLAMBRATIO 2.44 (H) 02/18/2020    DIAGNOSTIC IMAGING:  I have independently reviewed the scans and discussed with the patient. No results found.   ASSESSMENT:  1. Relapsed IgG kappa multiple myeloma with high risk features: -5cycles of carfilzomib, pomalidomide and dexamethasone from 03/05/2019 through 06/25/2019. -Myeloma panel on 07/09/2019 shows M spike 0.3 g, slightly up from 0.2 g previously. However free light chain ratio has 1.37 and improved. Kappa light chains are 24.8. - Carfilzomib discontinued after 08/06/2019 secondary to fluid retention.  2. Shortness of breath on exertion: -2D echo on 02/09/2019 shows EF 60-65%. No LVH. -Chest CT PE protocol on 02/24/2019 was negative. -Cardiac MRI on 03/07/2019 shows LVEF 56% with no amyloid. Troponin T and proBNP were negative. -PFTs show low expiratory reserve volume consistent with body habitus. Moderate diffusion defect which corrects to normal. FVC, FEV1, FEV1/FVC ratio are within normal limits. FVC  is reduced relative to SVC indicates air-trapping. No significant response after bronchodilators. Reduced diffusion capacity indicates  moderate loss of functional alveolar capillary space. -She was evaluated by Dr.Wert. -2D echocardiogram on 08/27/2019 shows LVEF 60 to 65%. No LVH.  3. Myeloma bone disease: -Bone density on 07/04/2019 shows T score 1.8.   PLAN:  1. Relapsed IgG kappa multiple myeloma with high risk features: -She is currently taking pomalidomide 4 mg 3 weeks on 1 week off. - Reviewed myeloma labs from 02/18/2020.  M spike is 0.3 g.  Kappa light chains has increased to 127.  Ratio also increased to 2.44. - Even though M spike is stable, free light chain ratio has been gradually increasing. - Hence I have recommended bone marrow biopsy at [facility].  She apparently felt pain during the last procedure.  We will request radiology for better analgesia. - We will send myeloma FISH panel and chromosome analysis. - She never had bone lesions on prior PET scans.  Hence I am not ordering PET scan for restaging. - RTC 2 to 3 weeks to discuss results and further plan.  2. Shortness of breath on exertion: -Continue bronchodilators and diuretics.  3. ID prophylaxis: -Continue acyclovir twice daily and aspirin 81 mg daily.  4. Neuropathy: -Continue gabapentin 300 mg 2-3 times a day.  5. Lower extremity edema: -She is taking metolazone daily.  She is not taking Bumex.  Swellings are improving.  6. Myeloma bone disease: -Creatinine has increased to 1.73.  We will hold Zometa today.  7. Hypokalemia: -Continue potassium 40 mEq at 9 AM and 20 mEq at 2 PM.  8.  Bilateral knee pains: -Secondary to arthritis.  Continue Percocet 5/325 twice daily as needed.  Orders placed this encounter:  Orders Placed This Encounter  Procedures  . CT Biopsy  . CT BONE MARROW BIOPSY & ASPIRATION     Derek Jack, MD [place] [phone]   I, Milinda Antis, am acting as a scribe for Dr. Sanda Linger.  I, Derek Jack MD, have reviewed the above documentation for  accuracy and completeness, and I agree with the above.

## 2020-03-08 ENCOUNTER — Other Ambulatory Visit (HOSPITAL_COMMUNITY): Payer: Self-pay

## 2020-03-08 DIAGNOSIS — C9000 Multiple myeloma not having achieved remission: Secondary | ICD-10-CM

## 2020-03-08 DIAGNOSIS — C9001 Multiple myeloma in remission: Secondary | ICD-10-CM

## 2020-03-10 ENCOUNTER — Other Ambulatory Visit: Payer: Self-pay

## 2020-03-10 ENCOUNTER — Inpatient Hospital Stay (HOSPITAL_COMMUNITY): Payer: Medicare Other | Attending: Hematology

## 2020-03-10 DIAGNOSIS — R5383 Other fatigue: Secondary | ICD-10-CM | POA: Insufficient documentation

## 2020-03-10 DIAGNOSIS — Z809 Family history of malignant neoplasm, unspecified: Secondary | ICD-10-CM | POA: Diagnosis not present

## 2020-03-10 DIAGNOSIS — R600 Localized edema: Secondary | ICD-10-CM | POA: Diagnosis not present

## 2020-03-10 DIAGNOSIS — G629 Polyneuropathy, unspecified: Secondary | ICD-10-CM | POA: Insufficient documentation

## 2020-03-10 DIAGNOSIS — Z7982 Long term (current) use of aspirin: Secondary | ICD-10-CM | POA: Insufficient documentation

## 2020-03-10 DIAGNOSIS — E876 Hypokalemia: Secondary | ICD-10-CM | POA: Diagnosis not present

## 2020-03-10 DIAGNOSIS — Z79899 Other long term (current) drug therapy: Secondary | ICD-10-CM | POA: Diagnosis not present

## 2020-03-10 DIAGNOSIS — M7989 Other specified soft tissue disorders: Secondary | ICD-10-CM | POA: Diagnosis not present

## 2020-03-10 DIAGNOSIS — M170 Bilateral primary osteoarthritis of knee: Secondary | ICD-10-CM | POA: Insufficient documentation

## 2020-03-10 DIAGNOSIS — Z95828 Presence of other vascular implants and grafts: Secondary | ICD-10-CM

## 2020-03-10 DIAGNOSIS — C9000 Multiple myeloma not having achieved remission: Secondary | ICD-10-CM | POA: Diagnosis not present

## 2020-03-10 DIAGNOSIS — M2550 Pain in unspecified joint: Secondary | ICD-10-CM | POA: Insufficient documentation

## 2020-03-10 DIAGNOSIS — Z8719 Personal history of other diseases of the digestive system: Secondary | ICD-10-CM | POA: Insufficient documentation

## 2020-03-10 DIAGNOSIS — Z8042 Family history of malignant neoplasm of prostate: Secondary | ICD-10-CM | POA: Diagnosis not present

## 2020-03-10 DIAGNOSIS — R0602 Shortness of breath: Secondary | ICD-10-CM | POA: Diagnosis not present

## 2020-03-10 DIAGNOSIS — K5730 Diverticulosis of large intestine without perforation or abscess without bleeding: Secondary | ICD-10-CM | POA: Insufficient documentation

## 2020-03-10 DIAGNOSIS — Z8249 Family history of ischemic heart disease and other diseases of the circulatory system: Secondary | ICD-10-CM | POA: Diagnosis not present

## 2020-03-10 DIAGNOSIS — I700 Atherosclerosis of aorta: Secondary | ICD-10-CM | POA: Insufficient documentation

## 2020-03-10 LAB — COMPREHENSIVE METABOLIC PANEL
ALT: 12 U/L (ref 0–44)
AST: 15 U/L (ref 15–41)
Albumin: 3.8 g/dL (ref 3.5–5.0)
Alkaline Phosphatase: 44 U/L (ref 38–126)
Anion gap: 8 (ref 5–15)
BUN: 28 mg/dL — ABNORMAL HIGH (ref 8–23)
CO2: 28 mmol/L (ref 22–32)
Calcium: 9.8 mg/dL (ref 8.9–10.3)
Chloride: 100 mmol/L (ref 98–111)
Creatinine, Ser: 1.75 mg/dL — ABNORMAL HIGH (ref 0.44–1.00)
GFR, Estimated: 32 mL/min — ABNORMAL LOW (ref >60)
Glucose, Bld: 105 mg/dL — ABNORMAL HIGH (ref 70–99)
Potassium: 3.6 mmol/L (ref 3.5–5.1)
Sodium: 136 mmol/L (ref 135–145)
Total Bilirubin: 0.7 mg/dL (ref 0.3–1.2)
Total Protein: 7.6 g/dL (ref 6.5–8.1)

## 2020-03-10 MED ORDER — SODIUM CHLORIDE 0.9% FLUSH
10.0000 mL | INTRAVENOUS | Status: DC | PRN
  Administered 2020-03-10: 10 mL via INTRAVENOUS

## 2020-03-10 MED ORDER — HEPARIN SOD (PORK) LOCK FLUSH 100 UNIT/ML IV SOLN
500.0000 [IU] | Freq: Once | INTRAVENOUS | Status: AC
  Administered 2020-03-10: 500 [IU] via INTRAVENOUS

## 2020-03-10 NOTE — Progress Notes (Signed)
Patient presented today for port flush and lab.  Port accessed without incident.  Patient tolerated well.  Patient discharged in stable condition via wheelchair. Followup as scheduled.

## 2020-03-11 ENCOUNTER — Other Ambulatory Visit (HOSPITAL_COMMUNITY): Payer: Self-pay | Admitting: *Deleted

## 2020-03-11 DIAGNOSIS — C9000 Multiple myeloma not having achieved remission: Secondary | ICD-10-CM

## 2020-03-11 MED ORDER — POMALIDOMIDE 4 MG PO CAPS: 4.0000 mg | ORAL_CAPSULE | Freq: Every day | ORAL | 0 refills | Status: DC

## 2020-03-11 NOTE — Telephone Encounter (Signed)
Chart reviewed. Pomalyst refilled per Dr. Delton Coombes

## 2020-03-15 ENCOUNTER — Other Ambulatory Visit (HOSPITAL_COMMUNITY): Payer: Self-pay | Admitting: Hematology

## 2020-03-16 ENCOUNTER — Ambulatory Visit (HOSPITAL_COMMUNITY): Payer: Medicare Other | Admitting: Hematology

## 2020-03-16 ENCOUNTER — Other Ambulatory Visit (HOSPITAL_COMMUNITY): Payer: Medicare Other

## 2020-03-17 ENCOUNTER — Other Ambulatory Visit (HOSPITAL_COMMUNITY): Payer: Self-pay

## 2020-03-17 MED ORDER — ALPRAZOLAM 1 MG PO TABS: 1.0000 mg | ORAL_TABLET | Freq: Once | ORAL | 0 refills | Status: AC

## 2020-03-18 ENCOUNTER — Other Ambulatory Visit: Payer: Self-pay | Admitting: Student

## 2020-03-19 ENCOUNTER — Encounter (HOSPITAL_COMMUNITY): Payer: Self-pay

## 2020-03-19 ENCOUNTER — Other Ambulatory Visit: Payer: Self-pay

## 2020-03-19 ENCOUNTER — Ambulatory Visit (HOSPITAL_COMMUNITY)
Admission: RE | Admit: 2020-03-19 | Discharge: 2020-03-19 | Disposition: A | Discharge status: Home / Self Care | Payer: Medicare Other | Source: Ambulatory Visit | Attending: Hematology | Admitting: Hematology

## 2020-03-19 DIAGNOSIS — Z95828 Presence of other vascular implants and grafts: Secondary | ICD-10-CM | POA: Diagnosis not present

## 2020-03-19 DIAGNOSIS — Z79899 Other long term (current) drug therapy: Secondary | ICD-10-CM | POA: Insufficient documentation

## 2020-03-19 DIAGNOSIS — C9000 Multiple myeloma not having achieved remission: Secondary | ICD-10-CM | POA: Insufficient documentation

## 2020-03-19 DIAGNOSIS — Z7982 Long term (current) use of aspirin: Secondary | ICD-10-CM | POA: Diagnosis not present

## 2020-03-19 DIAGNOSIS — D649 Anemia, unspecified: Secondary | ICD-10-CM | POA: Diagnosis not present

## 2020-03-19 DIAGNOSIS — D72819 Decreased white blood cell count, unspecified: Secondary | ICD-10-CM | POA: Insufficient documentation

## 2020-03-19 LAB — CBC WITH DIFFERENTIAL/PLATELET
Abs Immature Granulocytes: 0.01 10*3/uL (ref 0.00–0.07)
Basophils Absolute: 0.1 10*3/uL (ref 0.0–0.1)
Basophils Relative: 2 %
Eosinophils Absolute: 0.1 10*3/uL (ref 0.0–0.5)
Eosinophils Relative: 2 %
HCT: 31.4 % — ABNORMAL LOW (ref 36.0–46.0)
Hemoglobin: 10 g/dL — ABNORMAL LOW (ref 12.0–15.0)
Immature Granulocytes: 0 %
Lymphocytes Relative: 25 %
Lymphs Abs: 0.9 10*3/uL (ref 0.7–4.0)
MCH: 30.2 pg (ref 26.0–34.0)
MCHC: 31.8 g/dL (ref 30.0–36.0)
MCV: 94.9 fL (ref 80.0–100.0)
Monocytes Absolute: 0.8 10*3/uL (ref 0.1–1.0)
Monocytes Relative: 23 %
Neutro Abs: 1.7 10*3/uL (ref 1.7–7.7)
Neutrophils Relative %: 48 %
Platelets: 254 10*3/uL (ref 150–400)
RBC: 3.31 MIL/uL — ABNORMAL LOW (ref 3.87–5.11)
RDW: 14.8 % (ref 11.5–15.5)
WBC: 3.5 10*3/uL — ABNORMAL LOW (ref 4.0–10.5)
nRBC: 0 % (ref 0.0–0.2)

## 2020-03-19 MED ORDER — SODIUM CHLORIDE 0.9 % IV SOLN: INTRAVENOUS | Status: DC

## 2020-03-19 MED ORDER — FENTANYL CITRATE (PF) 100 MCG/2ML IJ SOLN
INTRAMUSCULAR | Status: AC
  Filled 2020-03-19: qty 4

## 2020-03-19 MED ORDER — LIDOCAINE HCL (PF) 1 % IJ SOLN
INTRAMUSCULAR | Status: AC | PRN
  Administered 2020-03-19: 10 mL via INTRADERMAL

## 2020-03-19 MED ORDER — MIDAZOLAM HCL 2 MG/2ML IJ SOLN
INTRAMUSCULAR | Status: AC | PRN
  Administered 2020-03-19: 1 mg via INTRAVENOUS
  Administered 2020-03-19: 2 mg via INTRAVENOUS
  Administered 2020-03-19: 1 mg via INTRAVENOUS

## 2020-03-19 MED ORDER — FENTANYL CITRATE (PF) 100 MCG/2ML IJ SOLN
INTRAMUSCULAR | Status: AC | PRN
  Administered 2020-03-19 (×4): 50 ug via INTRAVENOUS

## 2020-03-19 MED ORDER — MIDAZOLAM HCL 2 MG/2ML IJ SOLN
INTRAMUSCULAR | Status: AC
  Filled 2020-03-19: qty 4

## 2020-03-19 MED ORDER — HEPARIN SOD (PORK) LOCK FLUSH 100 UNIT/ML IV SOLN
500.0000 [IU] | INTRAVENOUS | Status: AC | PRN
  Administered 2020-03-19: 500 [IU]
  Filled 2020-03-19: qty 5

## 2020-03-19 NOTE — Discharge Instructions (Addendum)
Bone Marrow Aspiration and Bone Marrow Biopsy, Adult, Care After This sheet gives you information about how to care for yourself after your procedure. Your health care provider may also give you more specific instructions. If you have problems or questions, contact your health care provider. What can I expect after the procedure? After the procedure, it is common to have:  Mild pain and tenderness.  Swelling.  Bruising. Follow these instructions at home: Puncture site care  Follow instructions from your health care provider about how to take care of the puncture site. Make sure you: ? Wash your hands with soap and water before and after you change your bandage (dressing). If soap and water are not available, use hand sanitizer. ? Change your dressing as told by your health care provider.  Check your puncture site every day for signs of infection. Check for: ? More redness, swelling, or pain. ? Fluid or blood. ? Warmth. ? Pus or a bad smell.   Activity  Return to your normal activities as told by your health care provider. Ask your health care provider what activities are safe for you.  Do not lift anything that is heavier than 10 lb (4.5 kg), or the limit that you are told, until your health care provider says that it is safe.  Do not drive for 24 hours if you were given a sedative during your procedure. General instructions  Take over-the-counter and prescription medicines only as told by your health care provider.  Do not take baths, swim, or use a hot tub until your health care provider approves. Ask your health care provider if you may take showers. You may only be allowed to take sponge baths.  If directed, put ice on the affected area. To do this: ? Put ice in a plastic bag. ? Place a towel between your skin and the bag. ? Leave the ice on for 20 minutes, 2-3 times a day.  Keep all follow-up visits as told by your health care provider. This is important.   Contact a  health care provider if:  Your pain is not controlled with medicine.  You have a fever.  You have more redness, swelling, or pain around the puncture site.  You have fluid or blood coming from the puncture site.  Your puncture site feels warm to the touch.  You have pus or a bad smell coming from the puncture site. Summary  After the procedure, it is common to have mild pain, tenderness, swelling, and bruising.  Follow instructions from your health care provider about how to take care of the puncture site and what activities are safe for you.  Take over-the-counter and prescription medicines only as told by your health care provider.  Contact a health care provider if you have any signs of infection, such as fluid or blood coming from the puncture site. This information is not intended to replace advice given to you by your health care provider. Make sure you discuss any questions you have with your health care provider.    Moderate Conscious Sedation, Adult, Care After This sheet gives you information about how to care for yourself after your procedure. Your health care provider may also give you more specific instructions. If you have problems or questions, contact your health care provider. What can I expect after the procedure? After the procedure, it is common to have:  Sleepiness for several hours.  Impaired judgment for several hours.  Difficulty with balance.  Vomiting if you eat   soon. Follow these instructions at home: For the time period you were told by your health care provider:  Rest.  Do not participate in activities where you could fall or become injured.  Do not drive or use machinery.  Do not drink alcohol.  Do not take sleeping pills or medicines that cause drowsiness.  Do not make important decisions or sign legal documents.  Do not take care of children on your own.      Eating and drinking  Follow the diet recommended by your health  care provider.  Drink enough fluid to keep your urine pale yellow.  If you vomit: ? Drink water, juice, or soup when you can drink without vomiting. ? Make sure you have little or no nausea before eating solid foods.   General instructions  Take over-the-counter and prescription medicines only as told by your health care provider.  Have a responsible adult stay with you for the time you are told. It is important to have someone help care for you until you are awake and alert.  Do not smoke.  Keep all follow-up visits as told by your health care provider. This is important. Contact a health care provider if:  You are still sleepy or having trouble with balance after 24 hours.  You feel light-headed.  You keep feeling nauseous or you keep vomiting.  You develop a rash.  You have a fever.  You have redness or swelling around the IV site. Get help right away if:  You have trouble breathing.  You have new-onset confusion at home. Summary  After the procedure, it is common to feel sleepy, have impaired judgment, or feel nauseous if you eat too soon.  Rest after you get home. Know the things you should not do after the procedure.  Follow the diet recommended by your health care provider and drink enough fluid to keep your urine pale yellow.  Get help right away if you have trouble breathing or new-onset confusion at home. This information is not intended to replace advice given to you by your health care provider. Make sure you discuss any questions you have with your health care provider. Document Revised: 05/23/2019 Document Reviewed: 12/19/2018 Elsevier Patient Education  2021 [employer].

## 2020-03-19 NOTE — H&P (Signed)
Referring Physician(s): Katragadda,Sreedhar  Supervising Physician: Jacqulynn Cadet  Patient Status:  Kelli Hurley OP  Chief Complaint:  "I'm here for another bone marrow biopsy"  Subjective: Patient familiar to IR service from bone marrow biopsies in 2016 and 2021.  She has a history of relapsing multiple myeloma and presents again today for bone marrow biopsy for further evaluation.  She denies fever, headache, chest pain, dyspnea, cough, abdominal/back pain, nausea, vomiting or bleeding.  Past Medical History:  Diagnosis Date  . Anemia   . Arthritis   . Cancer ([place])   . Claustrophobia 10/05/2014  . Hypertension   . Leukopenia 08/03/2014  . Normocytic hypochromic anemia 08/03/2014  . Renal disorder    stage 3    Past Surgical History:  Procedure Laterality Date  . ABDOMINAL HYSTERECTOMY    . CARDIAC SURGERY     28 months old. States she had a leaky valve.   . COLONOSCOPY  03/26/2009   Dr. Oneida Alar; normal colon, small internal hemorrhoids.  Recommended repeat colonoscopy in 10 years.  . COLONOSCOPY WITH PROPOFOL N/A 11/25/2019   Procedure: COLONOSCOPY WITH PROPOFOL;  Surgeon: Eloise Harman, DO;  Location[facility];  Service: Endoscopy;  Laterality: N/A;  10:45am  . OTHER SURGICAL HISTORY     heart surgery as infant to "repair hole in heart"  . PORTACATH PLACEMENT Left 02/21/2019   Procedure: INSERTION PORT-A-CATH;  Surgeon: Virl Cagey, MD;  Location: [facility];  Service: General;  Laterality: Left;      Allergies: Diclofenac  Medications: Prior to Admission medications   Medication Sig Start Date End Date Taking? Authorizing Provider  acetaminophen (TYLENOL) 650 MG CR tablet Take 650 mg by mouth every 8 (eight) hours as needed for pain.    Yes [provider]  acyclovir (ZOVIRAX) 400 MG tablet Take 1 tablet (400 mg total) by mouth 2 (two) times daily. 09/29/19  Yes Derek Jack, MD  Ascorbic Acid (VITAMIN C) 1000 MG tablet Take 1,000 mg by  mouth daily.    Yes [provider]  aspirin EC 81 MG tablet Take 81 mg by mouth daily.   Yes [provider]  calcium carbonate (OS-CAL) 1250 (500 Ca) MG chewable tablet Chew 1 tablet by mouth daily.    Yes [provider]  cholecalciferol (VITAMIN D) 1000 units tablet Take 1,000 Units by mouth daily.   Yes [provider]  gabapentin (NEURONTIN) 300 MG capsule Take 1 capsule (300 mg total) by mouth 2 (two) times daily. 03/15/20  Yes Derek Jack, MD  HYDROcodone-acetaminophen (NORCO/VICODIN) 5-325 MG tablet Take 1 tablet by mouth every 12 (twelve) hours as needed for moderate pain. 01/15/20  Yes Nicholas Lose, MD  lidocaine-prilocaine (EMLA) cream Apply a quarter sized glob 1 hour prior to treatment. Patient taking differently: Apply 1 application topically daily as needed (prior to port access). 02/26/19  Yes Derek Jack, MD  magnesium oxide (MAG-OX) 400 (241.3 Mg) MG tablet TAKE 1 TABLET BY MOUTH TWICE DAILY 02/16/20  Yes Derek Jack, MD  metolazone (ZAROXOLYN) 2.5 MG tablet Take 1 tablet (2.5 mg total) by mouth daily. 02/18/20  Yes Derek Jack, MD  Multiple Vitamin (TAB-A-VITE) TABS TAKE 1 TABLET BY MOUTH ONCE DAILY. Patient taking differently: Take 1 tablet by mouth daily. 04/10/17  Yes Holley Bouche, NP  oxyCODONE-acetaminophen (PERCOCET/ROXICET) 5-325 MG tablet Take 1-2 tablets by mouth every 12 (twelve) hours as needed for severe pain. 02/18/20  Yes Derek Jack, MD  potassium chloride SA (KLOR-CON) 20  MEQ tablet Take 1 tablet (20 mEq total) by mouth 3 (three) times daily. 12/24/19  Yes Derek Jack, MD  trolamine salicylate (ASPERCREME) 10 % cream Apply 1 application topically 2 (two) times daily as needed for muscle pain.    Yes [provider]  albuterol (VENTOLIN HFA) 108 (90 Base) MCG/ACT inhaler Inhale 2 puffs into the lungs every 6 (six) hours as needed for wheezing or shortness of  breath. Patient not taking: No sig reported 02/18/20   Derek Jack, MD  pomalidomide (POMALYST) 4 MG capsule Take 1 capsule (4 mg total) by mouth daily. 03/11/20   Derek Jack, MD     Vital Signs: BP (!) 144/75   Pulse 77   Temp 97.6 F (36.4 C) (Oral)   Resp (!) 24   Ht 5' 2"  (1.575 m)   Wt (!) 335 lb (152 kg)   SpO2 99%   BMI 61.27 kg/m   Physical Exam awake/ alert; chest clear to auscultation bilaterally.  Clean, intact left chest wall Port-A-Cath.  Heart with regular rate and rhythm.  Abdomen obese, soft, positive bowel sounds, nontender.  Bilateral pretibial edema noted.  Imaging: No results found.  Labs:  CBC: Recent Labs    10/08/19 1226 11/26/19 1056 12/31/19 1129 02/18/20 1125  WBC 3.2* 3.2* 3.7* 2.1*  HGB 10.9* 10.8* 10.9* 10.8*  HCT 34.3* 33.2* 34.0* 33.6*  PLT 248 316 272 247    COAGS: No results for input(s): INR, APTT in the last 8760 hours.  BMP: Recent Labs    08/27/19 0946 09/10/19 0957 10/08/19 1226 11/05/19 1230 11/26/19 1056 12/31/19 1129 01/28/20 1212 02/18/20 1125 03/10/20 0955  NA 137 137 135 137   < > 137 137 136 136  K 3.0* 3.9 3.3* 3.3*   < > 3.8 3.6 3.2* 3.6  CL 87* 97* 93* 100   < > 98 98 96* 100  CO2 36* 29 30 27    < > 29 28 30 28   GLUCOSE 93 115* 98 130*   < > 101* 108* 98 105*  BUN 24* 21 32* 25*   < > 22 25* 29* 28*  CALCIUM 10.1 9.8 10.2 9.0   < > 9.9 9.7 9.6 9.8  CREATININE 1.56* 1.23* 1.70* 1.43*   < > 1.37* 1.70* 1.73* 1.75*  GFRNONAA 35* 47* 32* 39*   < > 43* 33* 33* 32*  GFRAA 41* 54* 37* 45*  --   --   --   --   --    < > = values in this interval not displayed.    LIVER FUNCTION TESTS: Recent Labs    12/31/19 1129 01/28/20 1212 02/18/20 1125 03/10/20 0955  BILITOT 0.4 0.4 0.7 0.7  AST 18 15 19 15   ALT 14 13 13 12   ALKPHOS 45 47 47 44  PROT 7.7 7.3 7.9 7.6  ALBUMIN 3.8 3.8 4.0 3.8    Assessment and Plan: Patient familiar to IR service from bone marrow biopsies in 2016 and 2021.  She  has a history of relapsing multiple myeloma and presents again today for bone marrow biopsy for further evaluation.Risks and benefits of procedure was discussed with the patient  including, but not limited to bleeding, infection, damage to adjacent structures or low yield requiring additional tests.  All of the questions were answered and there is agreement to proceed.  Consent signed and in chart.     Electronically Signed: D. Rowe Robert, PA-C 03/19/2020, 10:15 AM   I spent a total  of 20 minutes at the the patient's bedside AND on the patient's hospital floor or unit, greater than 50% of which was counseling/coordinating care for CT-guided bone marrow biopsy

## 2020-03-19 NOTE — Procedures (Signed)
Interventional Radiology Procedure Note  Procedure: CT guided aspirate and core biopsy of right iliac bone Complications: None Recommendations: - Bedrest supine x 1 hrs - Hydrocodone PRN  Pain - Follow biopsy results  Signed,  Jaziah Goeller K. Dalbert Stillings, MD   

## 2020-03-22 ENCOUNTER — Encounter (HOSPITAL_COMMUNITY)
Admission: RE | Admit: 2020-03-22 | Discharge: 2020-03-22 | Disposition: A | Discharge status: Home / Self Care | Payer: Medicare Other | Source: Ambulatory Visit | Attending: Hematology | Admitting: Hematology

## 2020-03-22 ENCOUNTER — Other Ambulatory Visit: Payer: Self-pay

## 2020-03-22 DIAGNOSIS — C9000 Multiple myeloma not having achieved remission: Secondary | ICD-10-CM | POA: Diagnosis present

## 2020-03-22 MED ORDER — FLUDEOXYGLUCOSE F - 18 (FDG) INJECTION
17.7700 | Freq: Once | INTRAVENOUS | Status: AC | PRN
  Administered 2020-03-22: 17.77 via INTRAVENOUS

## 2020-03-25 ENCOUNTER — Encounter (HOSPITAL_COMMUNITY): Payer: Self-pay

## 2020-03-25 ENCOUNTER — Inpatient Hospital Stay (HOSPITAL_COMMUNITY): Payer: Medicare Other

## 2020-03-25 ENCOUNTER — Other Ambulatory Visit: Payer: Self-pay

## 2020-03-25 VITALS — BP 138/82 | HR 84 | Temp 96.8°F | Resp 20

## 2020-03-25 DIAGNOSIS — C9000 Multiple myeloma not having achieved remission: Secondary | ICD-10-CM | POA: Diagnosis not present

## 2020-03-25 DIAGNOSIS — Z95828 Presence of other vascular implants and grafts: Secondary | ICD-10-CM

## 2020-03-25 LAB — CBC WITH DIFFERENTIAL/PLATELET
Abs Immature Granulocytes: 0.04 10*3/uL (ref 0.00–0.07)
Basophils Absolute: 0.1 10*3/uL (ref 0.0–0.1)
Basophils Relative: 1 %
Eosinophils Absolute: 0.1 10*3/uL (ref 0.0–0.5)
Eosinophils Relative: 3 %
HCT: 31.4 % — ABNORMAL LOW (ref 36.0–46.0)
Hemoglobin: 10.1 g/dL — ABNORMAL LOW (ref 12.0–15.0)
Immature Granulocytes: 1 %
Lymphocytes Relative: 20 %
Lymphs Abs: 0.8 10*3/uL (ref 0.7–4.0)
MCH: 30.7 pg (ref 26.0–34.0)
MCHC: 32.2 g/dL (ref 30.0–36.0)
MCV: 95.4 fL (ref 80.0–100.0)
Monocytes Absolute: 0.3 10*3/uL (ref 0.1–1.0)
Monocytes Relative: 8 %
Neutro Abs: 2.8 10*3/uL (ref 1.7–7.7)
Neutrophils Relative %: 67 %
Platelets: 265 10*3/uL (ref 150–400)
RBC: 3.29 MIL/uL — ABNORMAL LOW (ref 3.87–5.11)
RDW: 15 % (ref 11.5–15.5)
WBC: 4.2 10*3/uL (ref 4.0–10.5)
nRBC: 0 % (ref 0.0–0.2)

## 2020-03-25 LAB — LACTATE DEHYDROGENASE: LDH: 139 U/L (ref 98–192)

## 2020-03-25 LAB — COMPREHENSIVE METABOLIC PANEL
ALT: 11 U/L (ref 0–44)
AST: 16 U/L (ref 15–41)
Albumin: 3.8 g/dL (ref 3.5–5.0)
Alkaline Phosphatase: 45 U/L (ref 38–126)
Anion gap: 9 (ref 5–15)
BUN: 25 mg/dL — ABNORMAL HIGH (ref 8–23)
CO2: 28 mmol/L (ref 22–32)
Calcium: 9.5 mg/dL (ref 8.9–10.3)
Chloride: 100 mmol/L (ref 98–111)
Creatinine, Ser: 1.61 mg/dL — ABNORMAL HIGH (ref 0.44–1.00)
GFR, Estimated: 36 mL/min — ABNORMAL LOW (ref >60)
Glucose, Bld: 123 mg/dL — ABNORMAL HIGH (ref 70–99)
Potassium: 3.7 mmol/L (ref 3.5–5.1)
Sodium: 137 mmol/L (ref 135–145)
Total Bilirubin: 0.5 mg/dL (ref 0.3–1.2)
Total Protein: 7.2 g/dL (ref 6.5–8.1)

## 2020-03-25 MED ORDER — HEPARIN SOD (PORK) LOCK FLUSH 100 UNIT/ML IV SOLN
500.0000 [IU] | Freq: Once | INTRAVENOUS | Status: AC
  Administered 2020-03-25: 500 [IU] via INTRAVENOUS

## 2020-03-25 MED ORDER — SODIUM CHLORIDE 0.9% FLUSH
10.0000 mL | INTRAVENOUS | Status: DC | PRN
  Administered 2020-03-25: 10 mL via INTRAVENOUS

## 2020-03-25 NOTE — Progress Notes (Signed)
Kelli Hurley presented for Portacath access and flush.  Portacath located left chest wall accessed with  H 20 needle.  Good blood return present. Portacath flushed with 6ml NS and 500U/64ml Heparin and needle removed intact.  Procedure tolerated well and without incident.  Discharged via wheelchair in stable condition.

## 2020-03-26 LAB — KAPPA/LAMBDA LIGHT CHAINS
Kappa free light chain: 50.7 mg/L — ABNORMAL HIGH (ref 3.3–19.4)
Kappa, lambda light chain ratio: 2.01 — ABNORMAL HIGH (ref 0.26–1.65)
Lambda free light chains: 25.2 mg/L (ref 5.7–26.3)

## 2020-03-29 ENCOUNTER — Encounter (HOSPITAL_COMMUNITY): Payer: Self-pay | Admitting: Hematology

## 2020-03-29 LAB — IMMUNOFIXATION ELECTROPHORESIS
IgA: 455 mg/dL — ABNORMAL HIGH (ref 87–352)
IgG (Immunoglobin G), Serum: 1221 mg/dL (ref 586–1602)
IgM (Immunoglobulin M), Srm: 89 mg/dL (ref 26–217)
Total Protein ELP: 7.1 g/dL (ref 6.0–8.5)

## 2020-03-29 LAB — PROTEIN ELECTROPHORESIS, SERUM
A/G Ratio: 1.1 (ref 0.7–1.7)
Albumin ELP: 3.6 g/dL (ref 2.9–4.4)
Alpha-1-Globulin: 0.2 g/dL (ref 0.0–0.4)
Alpha-2-Globulin: 0.7 g/dL (ref 0.4–1.0)
Beta Globulin: 1.2 g/dL (ref 0.7–1.3)
Gamma Globulin: 1.3 g/dL (ref 0.4–1.8)
Globulin, Total: 3.4 g/dL (ref 2.2–3.9)
M-Spike, %: 0.3 g/dL — ABNORMAL HIGH
Total Protein ELP: 7 g/dL (ref 6.0–8.5)

## 2020-03-30 LAB — SURGICAL PATHOLOGY

## 2020-04-01 ENCOUNTER — Inpatient Hospital Stay (HOSPITAL_BASED_OUTPATIENT_CLINIC_OR_DEPARTMENT_OTHER): Payer: Medicare Other | Admitting: Hematology

## 2020-04-01 ENCOUNTER — Other Ambulatory Visit: Payer: Self-pay

## 2020-04-01 VITALS — BP 174/96 | HR 86 | Temp 96.7°F | Resp 20 | Wt 334.8 lb

## 2020-04-01 DIAGNOSIS — C9002 Multiple myeloma in relapse: Secondary | ICD-10-CM | POA: Diagnosis not present

## 2020-04-01 DIAGNOSIS — C9000 Multiple myeloma not having achieved remission: Secondary | ICD-10-CM | POA: Diagnosis not present

## 2020-04-01 MED ORDER — OXYCODONE-ACETAMINOPHEN 5-325 MG PO TABS: 1.0000 | ORAL_TABLET | Freq: Two times a day (BID) | ORAL | 0 refills | Status: DC | PRN

## 2020-04-01 NOTE — Progress Notes (Signed)
[place] [address], [place] [address]   CLINIC:  Medical Oncology/Hematology  PCP:  Alfonse Flavors, MD [address] / [place] [address]  [phone]  REASON FOR VISIT:  Follow-up for multiple myeloma  PRIOR THERAPY:  1. Bortezomib x 8 cycles from 10/05/2014 to 04/06/2016. 2. Carfilzomib x 6 cycles from 03/05/2019 to 08/06/2019.  CURRENT THERAPY: Pomalyst 4 mg 3/4 weeks  INTERVAL HISTORY:  Kelli Hurley, a 65 y.o. female, returns for routine follow-up for her multiple myeloma. Kelli Hurley was last seen on 02/25/2020.  Today she is accompanied by her daughter and she reports feeling well. She tolerated the bone marrow biopsy well. She denies having any new bone pains except for the arthritis in her knees and chronic swelling in her feet. The numbness and tingling is stable and she denies having any CP or light-headedness. She has not restarted Pomalyst, but continues taking potassium; she was tolerating Pomalyst 4 mg well.   REVIEW OF SYSTEMS:  Review of Systems  Constitutional: Positive for fatigue (90%). Negative for appetite change.  Cardiovascular: Positive for leg swelling. Negative for chest pain.  Musculoskeletal: Positive for arthralgias (arthritic pain in knees).  Neurological: Positive for numbness (stable). Negative for light-headedness.  All other systems reviewed and are negative.   PAST MEDICAL/SURGICAL HISTORY:  Past Medical History:  Diagnosis Date   Anemia    Arthritis    Cancer ([place])    Claustrophobia 10/05/2014   Hypertension    Leukopenia 08/03/2014   Normocytic hypochromic anemia 08/03/2014   Renal disorder    stage 3    Past Surgical History:  Procedure Laterality Date   ABDOMINAL HYSTERECTOMY     CARDIAC SURGERY     56 months old. States she had a leaky valve.    COLONOSCOPY  03/26/2009   Dr. Oneida Alar; normal colon, small internal hemorrhoids.  Recommended repeat colonoscopy in 10 years.    COLONOSCOPY WITH PROPOFOL N/A 11/25/2019   Procedure: COLONOSCOPY WITH PROPOFOL;  Surgeon: Eloise Harman, DO;  Location: [facility];  Service: Endoscopy;  Laterality: N/A;  10:45am   OTHER SURGICAL HISTORY     heart surgery as infant to "repair hole in heart"   PORTACATH PLACEMENT Left 02/21/2019   Procedure: INSERTION PORT-A-CATH;  Surgeon: Virl Cagey, MD;  Location: [facility] ORS;  Service: General;  Laterality: Left;    SOCIAL HISTORY:  Social History   Socioeconomic History   Marital status: Legally Separated    Spouse name: Not on file   Number of children: Not on file   Years of education: Not on file   Highest education level: Not on file  Occupational History   Not on file  Tobacco Use   Smoking status: Never Smoker   Smokeless tobacco: Never Used  Vaping Use   Vaping Use: Never used  Substance and Sexual Activity   Alcohol use: No   Drug use: No   Sexual activity: Not on file    Comment: divorced- 2 daughters  Other Topics Concern   Not on file  Social History Narrative   Not on file   Social Determinants of Health   Financial Resource Strain: Not on file  Food Insecurity: Not on file  Transportation Needs: Not on file  Physical Activity: Not on file  Stress: Not on file  Social Connections: Not on file  Intimate Partner Violence: Not on file    FAMILY HISTORY:  Family History  Problem Relation Age of Onset   Cancer Mother    Hypertension Mother    Cancer Father    Hypertension Father    Cancer Maternal Grandmother    Hypertension Sister    Hypertension Brother    Prostate cancer Brother    Colon cancer Neg Hx    Colon polyps Neg Hx     CURRENT MEDICATIONS:  Current Outpatient Medications  Medication Sig Dispense Refill   acetaminophen (TYLENOL) 650 MG CR tablet Take 650 mg by mouth every 8 (eight) hours as needed for pain.      acyclovir (ZOVIRAX) 400 MG tablet Take 1 tablet (400 mg total) by mouth 2 (two)  times daily. 60 tablet 6   albuterol (VENTOLIN HFA) 108 (90 Base) MCG/ACT inhaler Inhale 2 puffs into the lungs every 6 (six) hours as needed for wheezing or shortness of breath. (Patient not taking: No sig reported) 8 g 3   ALPRAZolam (XANAX) 1 MG tablet Take 1 mg by mouth daily as needed.     Ascorbic Acid (VITAMIN C) 1000 MG tablet Take 1,000 mg by mouth daily.      aspirin EC 81 MG tablet Take 81 mg by mouth daily.     calcium carbonate (OS-CAL) 1250 (500 Ca) MG chewable tablet Chew 1 tablet by mouth daily.      cholecalciferol (VITAMIN D) 1000 units tablet Take 1,000 Units by mouth daily.     gabapentin (NEURONTIN) 300 MG capsule Take 1 capsule (300 mg total) by mouth 2 (two) times daily. 60 capsule 6   HYDROcodone-acetaminophen (NORCO/VICODIN) 5-325 MG tablet Take 1 tablet by mouth every 12 (twelve) hours as needed for moderate pain. 30 tablet 0   lidocaine-prilocaine (EMLA) cream Apply a quarter sized glob 1 hour prior to treatment. (Patient taking differently: Apply 1 application topically daily as needed (prior to port access).) 30 g 5   magnesium oxide (MAG-OX) 400 (241.3 Mg) MG tablet TAKE 1 TABLET BY MOUTH TWICE DAILY 60 tablet 6   metolazone (ZAROXOLYN) 2.5 MG tablet Take 1 tablet (2.5 mg total) by mouth daily. 30 tablet 2   Multiple Vitamin (TAB-A-VITE) TABS TAKE 1 TABLET BY MOUTH ONCE DAILY. (Patient taking differently: Take 1 tablet by mouth daily.) 30 tablet 11   oxyCODONE-acetaminophen (PERCOCET/ROXICET) 5-325 MG tablet Take 1-2 tablets by mouth every 12 (twelve) hours as needed for severe pain. 60 tablet 0   pomalidomide (POMALYST) 4 MG capsule Take 1 capsule (4 mg total) by mouth daily. 21 capsule 0   potassium chloride SA (KLOR-CON) 20 MEQ tablet Take 1 tablet (20 mEq total) by mouth 3 (three) times daily. 90 tablet 4   trolamine salicylate (ASPERCREME) 10 % cream Apply 1 application topically 2 (two) times daily as needed for muscle pain.      No current  facility-administered medications for this visit.   Facility-Administered Medications Ordered in Other Visits  Medication Dose Route Frequency Provider Last Rate Last Admin   heparin lock flush 100 unit/mL  500 Units Intravenous Once Penland, Kelby Fam, MD       sodium chloride 0.9 % injection 10 mL  10 mL Intravenous Once [place], Kelby Fam, MD        ALLERGIES:  Allergies  Allergen Reactions   Diclofenac Swelling    Per pt, facial swelling    PHYSICAL EXAM:  Performance status (ECOG): 1 - Symptomatic but completely ambulatory  Vitals:   04/01/20 1206  BP: (!) 174/96  Pulse: 86  Resp: 20  Temp: (!) 96.7 F (35.9 C)  SpO2: 91%   Wt Readings from Last 3 Encounters:  04/01/20 (!) 334 lb 12.8 oz (151.9 kg)  03/19/20 (!) 335 lb (152 kg)  02/25/20 (!) 335 lb (152 kg)   Physical Exam Vitals reviewed.  Constitutional:      Appearance: Normal appearance. She is obese.  Cardiovascular:     Rate and Rhythm: Normal rate and regular rhythm.     Pulses: Normal pulses.     Heart sounds: Normal heart sounds.  Pulmonary:     Effort: Pulmonary effort is normal.     Breath sounds: Normal breath sounds.  Musculoskeletal:     Right lower leg: Edema (chronic lymphedema) present.     Left lower leg: Edema (chronic lymphedema) present.  Neurological:     General: No focal deficit present.     Mental Status: She is alert and oriented to person, place, and time.  Psychiatric:        Mood and Affect: Mood normal.        Behavior: Behavior normal.     LABORATORY DATA:  I have reviewed the labs as listed.  CBC Latest Ref Rng & Units 03/25/2020 03/19/2020 02/18/2020  WBC 4.0 - 10.5 K/uL 4.2 3.5(L) 2.1(L)  Hemoglobin 12.0 - 15.0 g/dL 10.1(L) 10.0(L) 10.8(L)  Hematocrit 36.0 - 46.0 % 31.4(L) 31.4(L) 33.6(L)  Platelets 150 - 400 K/uL 265 254 247   CMP Latest Ref Rng & Units 03/25/2020 03/10/2020 02/18/2020  Glucose 70 - 99 mg/dL 123(H) 105(H) 98  BUN 8 - 23 mg/dL 25(H) 28(H) 29(H)   Creatinine 0.44 - 1.00 mg/dL 1.61(H) 1.75(H) 1.73(H)  Sodium 135 - 145 mmol/L 137 136 136  Potassium 3.5 - 5.1 mmol/L 3.7 3.6 3.2(L)  Chloride 98 - 111 mmol/L 100 100 96(L)  CO2 22 - 32 mmol/L _0 Calcium 8.9 - 10.3 mg/dL 9.5 9.8 9.6  Total Protein 6.5 - 8.1 g/dL 7.2 7.6 7.9  Total Bilirubin 0.3 - 1.2 mg/dL 0.5 0.7 0.7  Alkaline Phos 38 - 126 U/L 45 44 47  AST 15 - 41 U/L _1 ALT 0 - 44 U/L _2 Component Value Date/Time   RBC 3.29 (L) 03/25/2020 1025   MCV 95.4 03/25/2020 1025   MCH 30.7 03/25/2020 1025   MCHC 32.2 03/25/2020 1025   RDW 15.0 03/25/2020 1025   LYMPHSABS 0.8 03/25/2020 1025   MONOABS 0.3 03/25/2020 1025   EOSABS 0.1 03/25/2020 1025   BASOSABS 0.1 03/25/2020 1025   Lab Results  Component Value Date   LDH 139 03/25/2020   LDH 204 (H) 07/09/2019   LDH 277 (H) 06/04/2019   Lab Results  Component Value Date   TOTALPROTELP 7.0 03/25/2020   TOTALPROTELP 7.1 03/25/2020   ALBUMINELP 3.6 03/25/2020   A1GS 0.2 03/25/2020   A2GS 0.7 03/25/2020   BETS 1.2 03/25/2020   GAMS 1.3 03/25/2020   MSPIKE 0.3 (H) 03/25/2020   SPEI Comment 03/25/2020    Lab Results  Component Value Date   KPAFRELGTCHN 50.7 (H) 03/25/2020   LAMBDASER 25.2 03/25/2020   KAPLAMBRATIO 2.01 (H) 03/25/2020   Lab Results  Component Value Date   IGGSERUM 1,221 03/25/2020   IGGSERUM 1,802 (H) 08/19/2018   IGGSERUM 1,732 (H) 03/21/2018   IGA 455 (H) 03/25/2020   IGA 81 (L) 08/19/2018   IGA 114 03/21/2018   IGMSERUM 89 03/25/2020   IGMSERUM 49 08/19/2018   IGMSERUM 56  03/21/2018     DIAGNOSTIC IMAGING:  I have independently reviewed the scans and discussed with the patient. NM PET Image Restage (PS) Whole Body  Result Date: 03/23/2020 CLINICAL DATA:  Subsequent treatment strategy for myeloma. EXAM: NUCLEAR MEDICINE PET WHOLE BODY TECHNIQUE: 17.8 mCi F-18 FDG was injected intravenously. Full-ring PET imaging was performed from the head to foot after the  radiotracer. CT data was obtained and used for attenuation correction and anatomic localization. Fasting blood glucose: 107 mg/dl COMPARISON:  Multiple exams, including 02/10/2019 FINDINGS: Mediastinal blood pool activity: SUV max 4.6 Body habitus reduces diagnostic sensitivity and specificity. HEAD/NECK: Bilateral tonsillar activity is once again identified, maximum SUV 9.2 on the right and 7.1 on the left, probably physiologic. No hypermetabolic adenopathy. Incidental CT findings: none CHEST: No significant abnormal hypermetabolic activity in this region. Incidental CT findings: Left Port-A-Cath tip: SVC. Mild cardiomegaly. Atherosclerotic calcification of the aortic arch. Linear subsegmental atelectasis or scarring in the posterior basal segment left lower lobe. ABDOMEN/PELVIS: No significant abnormal hypermetabolic activity in this region. Incidental CT findings: Mild abdominal aortic atherosclerosis. Sigmoid colon diverticulosis. SKELETON: No significant abnormal hypermetabolic activity in this region. Incidental CT findings: Severe osteoarthritis of both knees. Degenerative right sternoclavicular arthropathy. Bilateral mesoacromial os acromiale. EXTREMITIES: No significant abnormal hypermetabolic activity in this region. Incidental CT findings: Physiologic activity in the right calf and foot musculature. Activity along the bilateral knee effusions likely from synovitis. Activity along the right wrist is likely physiologic or incidental. IMPRESSION: 1. No findings of active malignancy. 2. Other imaging findings of potential clinical significance: Aortic Atherosclerosis (ICD10-I70.0). Mild cardiomegaly. Mild scarring in the left lower lobe. Sigmoid colon diverticulosis. Severe osteoarthritis of both knees. Electronically Signed   By: Van Clines M.D.   On: 03/23/2020 10:16   CT Biopsy  Result Date: 03/19/2020 INDICATION: History of remitting relapsing multiple myeloma. She presents for bone marrow  biopsy. EXAM: CT GUIDED BONE MARROW ASPIRATION AND CORE BIOPSY Interventional Radiologist:  Criselda Peaches, MD MEDICATIONS: None. ANESTHESIA/SEDATION: Moderate (conscious) sedation was employed during this procedure. A total of 4 milligrams versed and 100 micrograms fentanyl were administered intravenously. The patient's level of consciousness and vital signs were monitored continuously by radiology nursing throughout the procedure under my direct supervision. Total monitored sedation time: 15 minutes FLUOROSCOPY TIME:  None. COMPLICATIONS: None immediate. Estimated blood loss: <25 mL PROCEDURE: Informed written consent was obtained from the patient after a thorough discussion of the procedural risks, benefits and alternatives. All questions were addressed. Maximal Sterile Barrier Technique was utilized including caps, mask, sterile gowns, sterile gloves, sterile drape, hand hygiene and skin antiseptic. A timeout was performed prior to the initiation of the procedure. The patient was positioned prone and non-contrast localization CT was performed of the pelvis to demonstrate the iliac marrow spaces. Maximal barrier sterile technique utilized including caps, mask, sterile gowns, sterile gloves, large sterile drape, hand hygiene, and betadine prep. Under sterile conditions and local anesthesia, an 11 gauge coaxial bone biopsy needle was advanced into the right iliac marrow space. Needle position was confirmed with CT imaging. Initially, bone marrow aspiration was performed. Next, the 11 gauge outer cannula was utilized to obtain a right iliac bone marrow core biopsy. Needle was removed. Hemostasis was obtained with compression. The patient tolerated the procedure well. Samples were prepared with the cytotechnologist. IMPRESSION: Right iliac bone marrow aspiration and core biopsy. Electronically Signed   By: Jacqulynn Cadet M.D.   On: 03/19/2020 15:04   CT BONE MARROW BIOPSY & ASPIRATION  Result Date:  03/19/2020 INDICATION: History of remitting relapsing multiple myeloma. She presents for bone marrow biopsy. EXAM: CT GUIDED BONE MARROW ASPIRATION AND CORE BIOPSY Interventional Radiologist:  Criselda Peaches, MD MEDICATIONS: None. ANESTHESIA/SEDATION: Moderate (conscious) sedation was employed during this procedure. A total of 4 milligrams versed and 100 micrograms fentanyl were administered intravenously. The patient's level of consciousness and vital signs were monitored continuously by radiology nursing throughout the procedure under my direct supervision. Total monitored sedation time: 15 minutes FLUOROSCOPY TIME:  None. COMPLICATIONS: None immediate. Estimated blood loss: <25 mL PROCEDURE: Informed written consent was obtained from the patient after a thorough discussion of the procedural risks, benefits and alternatives. All questions were addressed. Maximal Sterile Barrier Technique was utilized including caps, mask, sterile gowns, sterile gloves, sterile drape, hand hygiene and skin antiseptic. A timeout was performed prior to the initiation of the procedure. The patient was positioned prone and non-contrast localization CT was performed of the pelvis to demonstrate the iliac marrow spaces. Maximal barrier sterile technique utilized including caps, mask, sterile gowns, sterile gloves, large sterile drape, hand hygiene, and betadine prep. Under sterile conditions and local anesthesia, an 11 gauge coaxial bone biopsy needle was advanced into the right iliac marrow space. Needle position was confirmed with CT imaging. Initially, bone marrow aspiration was performed. Next, the 11 gauge outer cannula was utilized to obtain a right iliac bone marrow core biopsy. Needle was removed. Hemostasis was obtained with compression. The patient tolerated the procedure well. Samples were prepared with the cytotechnologist. IMPRESSION: Right iliac bone marrow aspiration and core biopsy. Electronically Signed   By: Jacqulynn Cadet M.D.   On: 03/19/2020 15:04     ASSESSMENT:  1. Relapsed IgG kappa multiple myeloma with high risk features: -5cycles of carfilzomib, pomalidomide and dexamethasone from 03/05/2019 through 06/25/2019. -Myeloma panel on 07/09/2019 shows M spike 0.3 g, slightly up from 0.2 g previously. However free light chain ratio has 1.37 and improved. Kappa light chains are 24.8. - Carfilzomib discontinued after 08/06/2019 secondary to fluid retention.  2. Shortness of breath on exertion: -2D echo on 02/09/2019 shows EF 60-65%. No LVH. -Chest CT PE protocol on 02/24/2019 was negative. -Cardiac MRI on 03/07/2019 shows LVEF 56% with no amyloid. Troponin T and proBNP were negative. -PFTs show low expiratory reserve volume consistent with body habitus. Moderate diffusion defect which corrects to normal. FVC, FEV1, FEV1/FVC ratio are within normal limits. FVC is reduced relative to SVC indicates air-trapping. No significant response after bronchodilators. Reduced diffusion capacity indicates moderate loss of functional alveolar capillary space. -She was evaluated by Dr.Wert. -2D echocardiogram on 08/27/2019 shows LVEF 60 to 65%. No LVH.  3. Myeloma bone disease: -Bone density on 07/04/2019 shows T score 1.8.   PLAN:  1. Relapsed IgG kappa multiple myeloma with high risk features: -Myeloma panel on 03/25/2020 shows improvement in kappa light chains to 50.07 and ratio of 2.01.  M spike is stable at 0.3.  LDH normal.  Immunofixation shows IgG kappa. -Reviewed results of bone marrow biopsy from 03/19/2020 which showed approximately 5% plasma cells.  Chromosome analysis and FISH panel were normal. -PET scan on 03/22/2020 was negative for bone lesions or plasmacytoma. -From the studies above, her disease appears to be more or less stable.  Hence I did not recommend any change in treatment at this time. -She will restart pomalidomide 4 mg 3 weeks on 1 week off. -We will make a referral to Dr.  Theador Hawthorne for elevated creatinine.  We will hold  off on Zometa.  She has received it approximately for 2 years on and off. -RTC 8 weeks for follow-up.  2. Shortness of breath on exertion: -Continue bronchodilators and diuretics.  3. ID prophylaxis: -Continue acyclovir twice daily and aspirin 81 mg daily.  4. Neuropathy: -Continue gabapentin 300 mg 2-3 times a day.  5. Lower extremity edema: -She is taking metolazone daily.  Not taking Bumex.  Swellings have improved.  6. Myeloma bone disease: -Creatinine elevated at 1.6.  We will hold Zometa.  7. Hypokalemia: -Continue potassium 40 mEq in the morning and 20 mEq in the afternoon.  8. Bilateral knee pains: -Continue Percocet 5/325 twice daily.  Refill given.  Orders placed this encounter:  No orders of the defined types were placed in this encounter.    Derek Jack, MD [place] [phone]   I, Milinda Antis, am acting as a scribe for Dr. Sanda Linger.  I, Derek Jack MD, have reviewed the above documentation for accuracy and completeness, and I agree with the above.

## 2020-04-01 NOTE — Patient Instructions (Signed)
[place] at [facility] Discharge Instructions  You were seen today by Dr. Delton Coombes. He went over your recent results and scans. Your Zometa bone injections will be stopped. You may restart taking Pomalyst 1 tablet daily 3 weeks on with 1 week off. You will be referred to a kidney specialist to help manage your kidney problems. Dr. Delton Coombes will see you back in 2 months for labs and follow up.   Thank you for choosing [place] at [facility] to provide your oncology and hematology care.  To afford each patient quality time with our provider, please arrive at least 15 minutes before your scheduled appointment time.   If you have a lab appointment with the [facility] please come in thru the Main Entrance and check in at the main information desk  You need to re-schedule your appointment should you arrive 10 or more minutes late.  We strive to give you quality time with our providers, and arriving late affects you and other patients whose appointments are after yours.  Also, if you no show three or more times for appointments you may be dismissed from the clinic at the providers discretion.     Again, thank you for choosing [facility].  Our hope is that these requests will decrease the amount of time that you wait before being seen by our physicians.       _____________________________________________________________  Should you have questions after your visit to [facility], please contact our office at [phone] between the hours of 8:00 a.m. and 4:30 p.m.  Voicemails left after 4:00 p.m. will not be returned until the following business day.  For prescription refill requests, have your pharmacy contact our office and allow 72 hours.    Cancer Center Support Programs:   > Cancer Support Group  2nd Tuesday of the month 1pm-2pm, Journey Room

## 2020-04-22 ENCOUNTER — Other Ambulatory Visit (HOSPITAL_COMMUNITY): Payer: Self-pay

## 2020-04-22 MED ORDER — POMALIDOMIDE 4 MG PO CAPS: 4.0000 mg | ORAL_CAPSULE | Freq: Every day | ORAL | 0 refills | Status: DC

## 2020-04-22 NOTE — Telephone Encounter (Signed)
Chart reviewed. Pomalyst refilled per Dr. Delton Coombes

## 2020-05-12 DIAGNOSIS — I10 Essential (primary) hypertension: Secondary | ICD-10-CM | POA: Insufficient documentation

## 2020-05-14 ENCOUNTER — Other Ambulatory Visit (HOSPITAL_COMMUNITY): Payer: Self-pay

## 2020-05-14 ENCOUNTER — Other Ambulatory Visit: Payer: Self-pay | Admitting: Nephrology

## 2020-05-14 ENCOUNTER — Other Ambulatory Visit (HOSPITAL_COMMUNITY): Payer: Self-pay | Admitting: Physician Assistant

## 2020-05-14 ENCOUNTER — Other Ambulatory Visit (HOSPITAL_COMMUNITY): Payer: Self-pay | Admitting: Nephrology

## 2020-05-14 DIAGNOSIS — Z79899 Other long term (current) drug therapy: Secondary | ICD-10-CM

## 2020-05-14 DIAGNOSIS — G8929 Other chronic pain: Secondary | ICD-10-CM

## 2020-05-14 DIAGNOSIS — C9000 Multiple myeloma not having achieved remission: Secondary | ICD-10-CM

## 2020-05-14 DIAGNOSIS — I10 Essential (primary) hypertension: Secondary | ICD-10-CM

## 2020-05-14 DIAGNOSIS — N1832 Chronic kidney disease, stage 3b: Secondary | ICD-10-CM

## 2020-05-14 DIAGNOSIS — D638 Anemia in other chronic diseases classified elsewhere: Secondary | ICD-10-CM

## 2020-05-14 DIAGNOSIS — M25562 Pain in left knee: Secondary | ICD-10-CM

## 2020-05-14 DIAGNOSIS — R809 Proteinuria, unspecified: Secondary | ICD-10-CM

## 2020-05-14 MED ORDER — OXYCODONE-ACETAMINOPHEN 5-325 MG PO TABS: 1.0000 | ORAL_TABLET | Freq: Two times a day (BID) | ORAL | 0 refills | Status: DC | PRN

## 2020-05-18 ENCOUNTER — Other Ambulatory Visit (HOSPITAL_COMMUNITY): Payer: Self-pay

## 2020-05-18 MED ORDER — POMALIDOMIDE 4 MG PO CAPS: 4.0000 mg | ORAL_CAPSULE | Freq: Every day | ORAL | 0 refills | Status: DC

## 2020-05-18 NOTE — Telephone Encounter (Signed)
Chart reviewed. Pomalyst refilled per Dr. Delton Coombes

## 2020-05-24 ENCOUNTER — Ambulatory Visit (HOSPITAL_COMMUNITY)
Admission: RE | Admit: 2020-05-24 | Discharge: 2020-05-24 | Disposition: A | Discharge status: Home / Self Care | Payer: Medicare Other | Source: Ambulatory Visit | Attending: Nephrology | Admitting: Nephrology

## 2020-05-24 ENCOUNTER — Other Ambulatory Visit: Payer: Self-pay

## 2020-05-24 ENCOUNTER — Encounter (HOSPITAL_COMMUNITY): Payer: Self-pay

## 2020-05-24 ENCOUNTER — Inpatient Hospital Stay (HOSPITAL_COMMUNITY): Payer: Medicare Other | Attending: Hematology

## 2020-05-24 DIAGNOSIS — C9000 Multiple myeloma not having achieved remission: Secondary | ICD-10-CM | POA: Diagnosis present

## 2020-05-24 DIAGNOSIS — R600 Localized edema: Secondary | ICD-10-CM | POA: Diagnosis not present

## 2020-05-24 DIAGNOSIS — E876 Hypokalemia: Secondary | ICD-10-CM | POA: Diagnosis not present

## 2020-05-24 DIAGNOSIS — Z809 Family history of malignant neoplasm, unspecified: Secondary | ICD-10-CM | POA: Diagnosis not present

## 2020-05-24 DIAGNOSIS — Z8249 Family history of ischemic heart disease and other diseases of the circulatory system: Secondary | ICD-10-CM | POA: Diagnosis not present

## 2020-05-24 DIAGNOSIS — I10 Essential (primary) hypertension: Secondary | ICD-10-CM | POA: Diagnosis present

## 2020-05-24 DIAGNOSIS — I129 Hypertensive chronic kidney disease with stage 1 through stage 4 chronic kidney disease, or unspecified chronic kidney disease: Secondary | ICD-10-CM | POA: Insufficient documentation

## 2020-05-24 DIAGNOSIS — Z79899 Other long term (current) drug therapy: Secondary | ICD-10-CM | POA: Diagnosis not present

## 2020-05-24 DIAGNOSIS — Z8719 Personal history of other diseases of the digestive system: Secondary | ICD-10-CM | POA: Diagnosis not present

## 2020-05-24 DIAGNOSIS — R5383 Other fatigue: Secondary | ICD-10-CM | POA: Insufficient documentation

## 2020-05-24 DIAGNOSIS — D638 Anemia in other chronic diseases classified elsewhere: Secondary | ICD-10-CM | POA: Insufficient documentation

## 2020-05-24 DIAGNOSIS — R809 Proteinuria, unspecified: Secondary | ICD-10-CM | POA: Diagnosis present

## 2020-05-24 DIAGNOSIS — G629 Polyneuropathy, unspecified: Secondary | ICD-10-CM | POA: Diagnosis not present

## 2020-05-24 DIAGNOSIS — E669 Obesity, unspecified: Secondary | ICD-10-CM | POA: Diagnosis not present

## 2020-05-24 DIAGNOSIS — Z8042 Family history of malignant neoplasm of prostate: Secondary | ICD-10-CM | POA: Diagnosis not present

## 2020-05-24 DIAGNOSIS — M25561 Pain in right knee: Secondary | ICD-10-CM | POA: Insufficient documentation

## 2020-05-24 DIAGNOSIS — N1832 Chronic kidney disease, stage 3b: Secondary | ICD-10-CM | POA: Insufficient documentation

## 2020-05-24 DIAGNOSIS — N3289 Other specified disorders of bladder: Secondary | ICD-10-CM | POA: Insufficient documentation

## 2020-05-24 DIAGNOSIS — M7989 Other specified soft tissue disorders: Secondary | ICD-10-CM | POA: Insufficient documentation

## 2020-05-24 DIAGNOSIS — M25562 Pain in left knee: Secondary | ICD-10-CM | POA: Diagnosis not present

## 2020-05-24 DIAGNOSIS — R0602 Shortness of breath: Secondary | ICD-10-CM | POA: Insufficient documentation

## 2020-05-24 DIAGNOSIS — C9002 Multiple myeloma in relapse: Secondary | ICD-10-CM

## 2020-05-24 DIAGNOSIS — Z7952 Long term (current) use of systemic steroids: Secondary | ICD-10-CM | POA: Diagnosis not present

## 2020-05-24 LAB — CBC WITH DIFFERENTIAL/PLATELET
Abs Immature Granulocytes: 0.02 10*3/uL (ref 0.00–0.07)
Basophils Absolute: 0.1 10*3/uL (ref 0.0–0.1)
Basophils Relative: 3 %
Eosinophils Absolute: 0.2 10*3/uL (ref 0.0–0.5)
Eosinophils Relative: 10 %
HCT: 31.8 % — ABNORMAL LOW (ref 36.0–46.0)
Hemoglobin: 10 g/dL — ABNORMAL LOW (ref 12.0–15.0)
Immature Granulocytes: 1 %
Lymphocytes Relative: 32 %
Lymphs Abs: 0.8 10*3/uL (ref 0.7–4.0)
MCH: 30.2 pg (ref 26.0–34.0)
MCHC: 31.4 g/dL (ref 30.0–36.0)
MCV: 96.1 fL (ref 80.0–100.0)
Monocytes Absolute: 0.4 10*3/uL (ref 0.1–1.0)
Monocytes Relative: 20 %
Neutro Abs: 0.8 10*3/uL — ABNORMAL LOW (ref 1.7–7.7)
Neutrophils Relative %: 34 %
Platelets: 230 10*3/uL (ref 150–400)
RBC: 3.31 MIL/uL — ABNORMAL LOW (ref 3.87–5.11)
RDW: 14.3 % (ref 11.5–15.5)
WBC: 2.3 10*3/uL — ABNORMAL LOW (ref 4.0–10.5)
nRBC: 0 % (ref 0.0–0.2)

## 2020-05-24 LAB — COMPREHENSIVE METABOLIC PANEL
ALT: 11 U/L (ref 0–44)
AST: 15 U/L (ref 15–41)
Albumin: 3.8 g/dL (ref 3.5–5.0)
Alkaline Phosphatase: 45 U/L (ref 38–126)
Anion gap: 9 (ref 5–15)
BUN: 34 mg/dL — ABNORMAL HIGH (ref 8–23)
CO2: 27 mmol/L (ref 22–32)
Calcium: 9.6 mg/dL (ref 8.9–10.3)
Chloride: 102 mmol/L (ref 98–111)
Creatinine, Ser: 1.43 mg/dL — ABNORMAL HIGH (ref 0.44–1.00)
GFR, Estimated: 41 mL/min — ABNORMAL LOW (ref >60)
Glucose, Bld: 101 mg/dL — ABNORMAL HIGH (ref 70–99)
Potassium: 3.9 mmol/L (ref 3.5–5.1)
Sodium: 138 mmol/L (ref 135–145)
Total Bilirubin: 0.5 mg/dL (ref 0.3–1.2)
Total Protein: 7.3 g/dL (ref 6.5–8.1)

## 2020-05-24 LAB — MAGNESIUM: Magnesium: 1.9 mg/dL (ref 1.7–2.4)

## 2020-05-24 MED ORDER — HEPARIN SOD (PORK) LOCK FLUSH 100 UNIT/ML IV SOLN
500.0000 [IU] | Freq: Once | INTRAVENOUS | Status: AC
  Administered 2020-05-24: 500 [IU] via INTRAVENOUS

## 2020-05-24 MED ORDER — SODIUM CHLORIDE 0.9% FLUSH
10.0000 mL | INTRAVENOUS | Status: DC | PRN
  Administered 2020-05-24: 10 mL via INTRAVENOUS

## 2020-05-24 NOTE — Progress Notes (Signed)
Patient is taking pomalyst and has not missed any doses and reports no side effects at this time.   Corrin Parker presented for Portacath access and flush.  Portacath located left chest wall accessed with  H 20 needle.  Good blood return present. Portacath flushed with 35ml NS and 500U/1ml Heparin and needle removed intact.  Procedure tolerated well and without incident.  Pt stable during and after treatment.  AVS reviewed.  Pt discharged in stable condition ambulatory.

## 2020-05-24 NOTE — Patient Instructions (Signed)
Port flush with labs today. Return as scheduled.  Please call the if you have any questions or concerns.

## 2020-05-25 LAB — KAPPA/LAMBDA LIGHT CHAINS
Kappa free light chain: 76.1 mg/L — ABNORMAL HIGH (ref 3.3–19.4)
Kappa, lambda light chain ratio: 2.21 — ABNORMAL HIGH (ref 0.26–1.65)
Lambda free light chains: 34.4 mg/L — ABNORMAL HIGH (ref 5.7–26.3)

## 2020-05-25 LAB — PROTEIN ELECTROPHORESIS, SERUM
A/G Ratio: 1 (ref 0.7–1.7)
Albumin ELP: 3.4 g/dL (ref 2.9–4.4)
Alpha-1-Globulin: 0.2 g/dL (ref 0.0–0.4)
Alpha-2-Globulin: 0.6 g/dL (ref 0.4–1.0)
Beta Globulin: 1.1 g/dL (ref 0.7–1.3)
Gamma Globulin: 1.4 g/dL (ref 0.4–1.8)
Globulin, Total: 3.3 g/dL (ref 2.2–3.9)
M-Spike, %: 0.4 g/dL — ABNORMAL HIGH
Total Protein ELP: 6.7 g/dL (ref 6.0–8.5)

## 2020-05-31 ENCOUNTER — Other Ambulatory Visit (HOSPITAL_COMMUNITY): Payer: Self-pay

## 2020-05-31 ENCOUNTER — Other Ambulatory Visit: Payer: Self-pay

## 2020-05-31 ENCOUNTER — Inpatient Hospital Stay (HOSPITAL_BASED_OUTPATIENT_CLINIC_OR_DEPARTMENT_OTHER): Payer: Medicare Other | Admitting: Hematology

## 2020-05-31 VITALS — BP 144/64 | HR 81 | Temp 97.0°F | Resp 20 | Wt 349.1 lb

## 2020-05-31 DIAGNOSIS — C9002 Multiple myeloma in relapse: Secondary | ICD-10-CM | POA: Diagnosis not present

## 2020-05-31 DIAGNOSIS — M25562 Pain in left knee: Secondary | ICD-10-CM

## 2020-05-31 DIAGNOSIS — G8929 Other chronic pain: Secondary | ICD-10-CM

## 2020-05-31 DIAGNOSIS — C9000 Multiple myeloma not having achieved remission: Secondary | ICD-10-CM | POA: Diagnosis not present

## 2020-05-31 MED ORDER — OXYCODONE-ACETAMINOPHEN 5-325 MG PO TABS: 1.0000 | ORAL_TABLET | Freq: Two times a day (BID) | ORAL | 0 refills | Status: DC | PRN

## 2020-05-31 MED ORDER — DEXAMETHASONE 4 MG PO TABS: ORAL_TABLET | ORAL | 3 refills | Status: DC

## 2020-05-31 NOTE — Patient Instructions (Signed)
[place] at [facility] Discharge Instructions  You were seen today by Dr. Delton Coombes. He went over your recent results. Start taking Pomalyst 4 mg 3 weeks on and 1 week off. You will also be prescribed Decadron 20 mg to take once a week. Dr. Delton Coombes will see you back in 6 weeks for labs and follow up.   Thank you for choosing [place] at [facility] to provide your oncology and hematology care.  To afford each patient quality time with our provider, please arrive at least 15 minutes before your scheduled appointment time.   If you have a lab appointment with the [facility] please come in thru the Main Entrance and check in at the main information desk  You need to re-schedule your appointment should you arrive 10 or more minutes late.  We strive to give you quality time with our providers, and arriving late affects you and other patients whose appointments are after yours.  Also, if you no show three or more times for appointments you may be dismissed from the clinic at the providers discretion.     Again, thank you for choosing [facility].  Our hope is that these requests will decrease the amount of time that you wait before being seen by our physicians.       _____________________________________________________________  Should you have questions after your visit to [facility], please contact our office at [phone] between the hours of 8:00 a.m. and 4:30 p.m.  Voicemails left after 4:00 p.m. will not be returned until the following business day.  For prescription refill requests, have your pharmacy contact our office and allow 72 hours.    Cancer Center Support Programs:   > Cancer Support Group  2nd Tuesday of the month 1pm-2pm, Journey Room

## 2020-05-31 NOTE — Progress Notes (Signed)
[facility] [address] [place] [address]   CLINIC:  Medical Oncology/Hematology  PCP:  Alfonse Flavors, MD [address] [place] / [place] [address]  [phone]  REASON FOR VISIT:  Follow-up for multiple myeloma  PRIOR THERAPY:  1. Bortezomib x 8 cycles from 10/05/2014 to 04/06/2016. 2. Carfilzomib x 6 cycles from 03/05/2019 to 08/06/2019.  CURRENT THERAPY: Pomalyst 4 mg 3/4 weeks  INTERVAL HISTORY:  [name], a 65 y.o. female, returns for routine follow-up for her multiple myeloma. Shamirah was last seen on 04/01/2020.  Today she reports feeling okay. She is taking Pomalyst 3 weeks on with 1 week off and tolerating it well; she will begin her next bottle on 04/25. She denies having any new cough, SOB, or new pains. She is not taking Decadron as it was making her swollen; she is taking metolazone 2.5 mg daily. She complains of having pain in her right arm and knees and numbness in her feet. Her appetite is excellent.   REVIEW OF SYSTEMS:  Review of Systems  Constitutional: Positive for fatigue (75%). Negative for appetite change.  Cardiovascular: Positive for leg swelling (minimal).  Musculoskeletal: Positive for arthralgias (8/10 R knees pain).  Neurological: Positive for numbness (& tingling in R arm and feet).  All other systems reviewed and are negative.   PAST MEDICAL/SURGICAL HISTORY:  Past Medical History:  Diagnosis Date  . Anemia   . Arthritis   . Cancer ([place])   . Claustrophobia 10/05/2014  . Hypertension   . Leukopenia 08/03/2014  . Normocytic hypochromic anemia 08/03/2014  . Renal disorder    stage 3    Past Surgical History:  Procedure Laterality Date  . ABDOMINAL HYSTERECTOMY    . CARDIAC SURGERY     26 months old. States she had a leaky valve.   . COLONOSCOPY  03/26/2009   Dr. Oneida Alar; normal colon, small internal hemorrhoids.  Recommended repeat colonoscopy in 10 years.  . COLONOSCOPY WITH PROPOFOL N/A  11/25/2019   Procedure: COLONOSCOPY WITH PROPOFOL;  Surgeon: Eloise Harman, DO;  Location: [facility];  Service: Endoscopy;  Laterality: N/A;  10:45am  . OTHER SURGICAL HISTORY     heart surgery as infant to "repair hole in heart"  . PORTACATH PLACEMENT Left 02/21/2019   Procedure: INSERTION PORT-A-CATH;  Surgeon: Virl Cagey, MD;  Location: [facility];  Service: General;  Laterality: Left;    SOCIAL HISTORY:  Social History   Socioeconomic History  . Marital status: Legally Separated    Spouse name: Not on file  . Number of children: Not on file  . Years of education: Not on file  . Highest education level: Not on file  Occupational History  . Not on file  Tobacco Use  . Smoking status: Never Smoker  . Smokeless tobacco: Never Used  Vaping Use  . Vaping Use: Never used  Substance and Sexual Activity  . Alcohol use: No  . Drug use: No  . Sexual activity: Not on file    Comment: divorced- 2 daughters  Other Topics Concern  . Not on file  Social History Narrative  . Not on file   Social Determinants of Health   Financial Resource Strain: Low Risk   . Difficulty of Paying Living Expenses: Not hard at all  Food Insecurity: No Food Insecurity  . Worried About Charity fundraiser in the Last Year: Never true  . Ran Out of Food in the  Last Year: Never true  Transportation Needs: No Transportation Needs  . Lack of Transportation (Medical): No  . Lack of Transportation (Non-Medical): No  Physical Activity: Inactive  . Days of Exercise per Week: 0 days  . Minutes of Exercise per Session: 0 min  Stress: No Stress Concern Present  . Feeling of Stress : Not at all  Social Connections: Unknown  . Frequency of Communication with Friends and Family: More than three times a week  . Frequency of Social Gatherings with Friends and Family: Once a week  . Attends Religious Services: More than 4 times per year  . Active Member of Clubs or Organizations: No  . Attends Theatre manager Meetings: Never  . Marital Status: Not on file  Intimate Partner Violence: Not At Risk  . Fear of Current or Ex-Partner: No  . Emotionally Abused: No  . Physically Abused: No  . Sexually Abused: No    FAMILY HISTORY:  Family History  Problem Relation Age of Onset  . Cancer Mother   . Hypertension Mother   . Cancer Father   . Hypertension Father   . Cancer Maternal Grandmother   . Hypertension Sister   . Hypertension Brother   . Prostate cancer Brother   . Colon cancer Neg Hx   . Colon polyps Neg Hx     CURRENT MEDICATIONS:  Current Outpatient Medications  Medication Sig Dispense Refill  . acetaminophen (TYLENOL) 650 MG CR tablet Take 650 mg by mouth every 8 (eight) hours as needed for pain.     Marland Kitchen acyclovir (ZOVIRAX) 400 MG tablet Take 1 tablet (400 mg total) by mouth 2 (two) times daily. 60 tablet 6  . albuterol (VENTOLIN HFA) 108 (90 Base) MCG/ACT inhaler Inhale 2 puffs into the lungs every 6 (six) hours as needed for wheezing or shortness of breath. 8 g 3  . ALPRAZolam (XANAX) 1 MG tablet Take 1 mg by mouth daily as needed.    . Ascorbic Acid (VITAMIN C) 1000 MG tablet Take 1,000 mg by mouth daily.     Marland Kitchen aspirin EC 81 MG tablet Take 81 mg by mouth daily.    . calcium carbonate (OS-CAL) 1250 (500 Ca) MG chewable tablet Chew 1 tablet by mouth daily.     . cholecalciferol (VITAMIN D) 1000 units tablet Take 1,000 Units by mouth daily.    Regino Schultze Bandages & Supports (MEDICAL COMPRESSION STOCKINGS) MISC See admin instructions.    Regino Schultze Bandages & Supports (WRIST SPLINT) MISC See admin instructions.    . furosemide (LASIX) 20 MG tablet 1 tablet    . gabapentin (NEURONTIN) 300 MG capsule Take 1 capsule (300 mg total) by mouth 2 (two) times daily. 60 capsule 6  . lidocaine-prilocaine (EMLA) cream Apply a quarter sized glob 1 hour prior to treatment. (Patient taking differently: Apply 1 application topically daily as needed (prior to port access).) 30 g 5  .  magnesium oxide (MAG-OX) 400 (241.3 Mg) MG tablet TAKE 1 TABLET BY MOUTH TWICE DAILY 60 tablet 6  . metolazone (ZAROXOLYN) 2.5 MG tablet Take 1 tablet (2.5 mg total) by mouth daily. 30 tablet 2  . Multiple Vitamin (TAB-A-VITE) TABS TAKE 1 TABLET BY MOUTH ONCE DAILY. (Patient taking differently: Take 1 tablet by mouth daily.) 30 tablet 11  . oxyCODONE-acetaminophen (PERCOCET/ROXICET) 5-325 MG tablet Take 1-2 tablets by mouth every 12 (twelve) hours as needed for severe pain. 20 tablet 0  . pomalidomide (POMALYST) 4 MG capsule Take 1 capsule (4  mg total) by mouth daily. 21 capsule 0  . potassium chloride SA (KLOR-CON) 20 MEQ tablet Take 1 tablet (20 mEq total) by mouth 3 (three) times daily. 90 tablet 4  . trolamine salicylate (ASPERCREME) 10 % cream Apply 1 application topically 2 (two) times daily as needed for muscle pain.     Marland Kitchen dexamethasone (DECADRON) 4 MG tablet Take 5 tablets once a week 30 tablet 3   No current facility-administered medications for this visit.   Facility-Administered Medications Ordered in Other Visits  Medication Dose Route Frequency Provider Last Rate Last Admin  . heparin lock flush 100 unit/mL  500 Units Intravenous Once Penland, Larene Beach K, MD      . sodium chloride 0.9 % injection 10 mL  10 mL Intravenous Once Penland, Kelby Fam, MD        ALLERGIES:  Allergies  Allergen Reactions  . Diclofenac Swelling    Per pt, facial swelling    PHYSICAL EXAM:  Performance status (ECOG): 1 - Symptomatic but completely ambulatory  Vitals:   05/31/20 1214  BP: (!) 144/64  Pulse: 81  Resp: 20  Temp: (!) 97 F (36.1 C)  SpO2: 95%   Wt Readings from Last 3 Encounters:  05/31/20 (!) 349 lb 1.6 oz (158.4 kg)  04/01/20 (!) 334 lb 12.8 oz (151.9 kg)  03/19/20 (!) 335 lb (152 kg)   Physical Exam Vitals reviewed.  Constitutional:      Appearance: Normal appearance. She is obese.  Cardiovascular:     Rate and Rhythm: Normal rate and regular rhythm.     Pulses: Normal  pulses.     Heart sounds: Normal heart sounds.  Pulmonary:     Effort: Pulmonary effort is normal.     Breath sounds: Normal breath sounds.  Musculoskeletal:     Right lower leg: Edema (trace) present.     Left lower leg: Edema (trace) present.  Neurological:     General: No focal deficit present.     Mental Status: She is alert and oriented to person, place, and time.  Psychiatric:        Mood and Affect: Mood normal.        Behavior: Behavior normal.     LABORATORY DATA:  I have reviewed the labs as listed.  CBC Latest Ref Rng & Units 05/24/2020 03/25/2020 03/19/2020  WBC 4.0 - 10.5 K/uL 2.3(L) 4.2 3.5(L)  Hemoglobin 12.0 - 15.0 g/dL 10.0(L) 10.1(L) 10.0(L)  Hematocrit 36.0 - 46.0 % 31.8(L) 31.4(L) 31.4(L)  Platelets 150 - 400 K/uL 230 265 254   CMP Latest Ref Rng & Units 05/24/2020 03/25/2020 03/10/2020  Glucose 70 - 99 mg/dL 101(H) 123(H) 105(H)  BUN 8 - 23 mg/dL 34(H) 25(H) 28(H)  Creatinine 0.44 - 1.00 mg/dL 1.43(H) 1.61(H) 1.75(H)  Sodium 135 - 145 mmol/L 138 137 136  Potassium 3.5 - 5.1 mmol/L 3.9 3.7 3.6  Chloride 98 - 111 mmol/L 102 100 100  CO2 22 - 32 mmol/L _0 Calcium 8.9 - 10.3 mg/dL 9.6 9.5 9.8  Total Protein 6.5 - 8.1 g/dL 7.3 7.2 7.6  Total Bilirubin 0.3 - 1.2 mg/dL 0.5 0.5 0.7  Alkaline Phos 38 - 126 U/L 45 45 44  AST 15 - 41 U/L _1 ALT 0 - 44 U/L _2 Component Value Date/Time   RBC 3.31 (L) 05/24/2020 1244   MCV 96.1 05/24/2020 1244   MCH 30.2 05/24/2020 1244   MCHC 31.4  05/24/2020 1244   RDW 14.3 05/24/2020 1244   LYMPHSABS 0.8 05/24/2020 1244   MONOABS 0.4 05/24/2020 1244   EOSABS 0.2 05/24/2020 1244   BASOSABS 0.1 05/24/2020 1244   Lab Results  Component Value Date   TOTALPROTELP 6.7 05/24/2020   ALBUMINELP 3.4 05/24/2020   A1GS 0.2 05/24/2020   A2GS 0.6 05/24/2020   BETS 1.1 05/24/2020   GAMS 1.4 05/24/2020   MSPIKE 0.4 (H) 05/24/2020   SPEI Comment 05/24/2020    Lab Results  Component Value Date   KPAFRELGTCHN  76.1 (H) 05/24/2020   LAMBDASER 34.4 (H) 05/24/2020   KAPLAMBRATIO 2.21 (H) 05/24/2020    DIAGNOSTIC IMAGING:  I have independently reviewed the scans and discussed with the patient. US RENAL  Result Date: 05/25/2020 CLINICAL DATA:  Stage III B chronic kidney disease. History of myeloma. EXAM: RENAL / URINARY TRACT ULTRASOUND COMPLETE COMPARISON:  PET-CT 03/22/2020 FINDINGS: Right Kidney: Renal measurements: 10.4 by 3.9 by 4.9 cm = volume: 106 mL. Echogenicity within normal limits. No mass or hydronephrosis visualized. Left Kidney: Renal measurements: 10.9 by 4.0 by 4.8 cm = volume: 109 mL. Echogenicity within normal limits. No mass or hydronephrosis visualized. Bladder: Appears normal for degree of bladder distention. Other: Body habitus and bowel gas provided some limitations on windows for imaging the kidneys. IMPRESSION: 1. No specific sonographic abnormality of the kidneys is identified. Mildly reduced sensitivity due to body habitus and bowel gas. Electronically Signed   By: Van Clines M.D.   On: 05/25/2020 09:21     ASSESSMENT:  1. Relapsed IgG kappa multiple myeloma with high risk features: -5cycles of carfilzomib, pomalidomide and dexamethasone from 03/05/2019 through 06/25/2019. -Myeloma panel on 07/09/2019 shows M spike 0.3 g, slightly up from 0.2 g previously. However free light chain ratio has 1.37 and improved. Kappa light chains are 24.8. -Carfilzomib discontinued after 08/06/2019 secondary to fluid retention. - She is currently on Pomalyst 4 mg 3 weeks on 1 week off. - PET scan on 03/22/2020 with no evidence of malignancy. - Bone marrow biopsy on 03/19/2020 showed approximately 5% plasma cells.  Chromosome analysis and FISH panel were normal.  2. Shortness of breath on exertion: -2D echo on 02/09/2019 shows EF 60-65%. No LVH. -Chest CT PE protocol on 02/24/2019 was negative. -Cardiac MRI on 03/07/2019 shows LVEF 56% with no amyloid. Troponin T and proBNP were  negative. -PFTs show low expiratory reserve volume consistent with body habitus. Moderate diffusion defect which corrects to normal. FVC, FEV1, FEV1/FVC ratio are within normal limits. FVC is reduced relative to SVC indicates air-trapping. No significant response after bronchodilators. Reduced diffusion capacity indicates moderate loss of functional alveolar capillary space. -She was evaluated by Dr.Wert. -2D echocardiogram on 08/27/2019 shows LVEF 60 to 65%. No LVH.  3. Myeloma bone disease: -Bone density on 07/04/2019 shows T score 1.8.   PLAN:  1. Relapsed IgG kappa multiple myeloma with high risk features: -Reviewed myeloma panel from 05/24/2020. - SPEP increased to 0.4 g.  Free light chain ratio increased to 2.21.  Kappa light chain slightly increased to 76 from 50.  Immunofixation shows IgG kappa. - Reviewed CBC which showed white count was 2.3 with ANC of 800 on 05/24/2020.  Creatinine has improved to 1.43. - She will start her next cycle of Pomalyst 4 mg 3 weeks on 1 week off today. - Because of slight increase in M spike, I have recommended starting dexamethasone 20 mg weekly. - RTC 6 weeks for follow-up.  Repeat myeloma panel prior to  next visit.  2. Shortness of breath on exertion: -Continue bronchodilators and diuretics.  3. ID prophylaxis: -Continue acyclovir twice daily and aspirin 81 mg daily.  4. Neuropathy: -Continue gabapentin 300 mg 2-3 times a day.  5. Lower extremity edema: -Continue metolazone on Monday, Wednesday and Friday.  Not taking Bumex.  6. Myeloma bone disease: -Zometa was held on 01/28/2020 due to worsening renal function.  She already completed 2 years on and off.  7. Hypokalemia: -Continue potassium 40 mEq in the morning and 20 mEq in the afternoon.  8. Bilateral knee pains: -Continue Percocet 5/325 twice daily.  Orders placed this encounter:  No orders of the defined types were placed in this encounter.    Derek Jack, MD [place] [phone]    I, Milinda Antis, am acting as a scribe for Dr. Sanda Linger.  I, Derek Jack MD, have reviewed the above documentation for accuracy and completeness, and I agree with the above.

## 2020-06-01 ENCOUNTER — Other Ambulatory Visit (HOSPITAL_COMMUNITY): Payer: Self-pay | Admitting: Hematology

## 2020-06-01 DIAGNOSIS — C9000 Multiple myeloma not having achieved remission: Secondary | ICD-10-CM

## 2020-06-03 ENCOUNTER — Encounter: Payer: Self-pay | Admitting: Orthopedic Surgery

## 2020-06-03 ENCOUNTER — Other Ambulatory Visit: Payer: Self-pay

## 2020-06-03 ENCOUNTER — Ambulatory Visit (INDEPENDENT_AMBULATORY_CARE_PROVIDER_SITE_OTHER): Payer: Medicare Other | Admitting: Orthopedic Surgery

## 2020-06-03 ENCOUNTER — Ambulatory Visit: Payer: Medicare Other

## 2020-06-03 VITALS — BP 187/95 | HR 67 | Ht 62.0 in | Wt 345.0 lb

## 2020-06-03 DIAGNOSIS — G8929 Other chronic pain: Secondary | ICD-10-CM

## 2020-06-03 DIAGNOSIS — M25561 Pain in right knee: Secondary | ICD-10-CM

## 2020-06-03 DIAGNOSIS — M25562 Pain in left knee: Secondary | ICD-10-CM

## 2020-06-03 DIAGNOSIS — M170 Bilateral primary osteoarthritis of knee: Secondary | ICD-10-CM

## 2020-06-03 NOTE — Patient Instructions (Addendum)
Try to lose weight  You have received an injection of steroids into the joint. 15% of patients will have increased pain within the 24 hours postinjection.   This is transient and will go away.   We recommend that you use ice packs on the injection site for 20 minutes every 2 hours and extra strength Tylenol 2 tablets every 8 as needed until the pain resolves.  If you continue to have pain after taking the Tylenol and using the ice please call the office for further instructions.

## 2020-06-03 NOTE — Progress Notes (Signed)
OFFICE VISIT  Summary assessment and plan:   Yuliet continues with bilateral knee pain osteoarthritis and morbid obesity  At this point we can only inject the knees  Follow-up as needed  Chief Complaint  Patient presents with  . Knee Pain    bilateral    65 year old female morbid obesity BMI of 63  Bilateral knee pain osteoarthritis previously treated with injections complains of continued pain   Review of Systems  Constitutional: Negative for chills and fever.  Respiratory: Positive for shortness of breath.   Cardiovascular: Negative for chest pain.  Neurological: Negative for tingling.     Past Medical History:  Diagnosis Date  . Anemia   . Arthritis   . Cancer ([place])   . Claustrophobia 10/05/2014  . Hypertension   . Leukopenia 08/03/2014  . Normocytic hypochromic anemia 08/03/2014  . Renal disorder    stage 3     Past Surgical History:  Procedure Laterality Date  . ABDOMINAL HYSTERECTOMY    . CARDIAC SURGERY     50 months old. States she had a leaky valve.   . COLONOSCOPY  03/26/2009   Dr. Oneida Alar; normal colon, small internal hemorrhoids.  Recommended repeat colonoscopy in 10 years.  . COLONOSCOPY WITH PROPOFOL N/A 11/25/2019   Procedure: COLONOSCOPY WITH PROPOFOL;  Surgeon: Eloise Harman, DO;  [facility];  Service: Endoscopy;  Laterality: N/A;  10:45am  . OTHER SURGICAL HISTORY     heart surgery as infant to "repair hole in heart"  . PORTACATH PLACEMENT Left 02/21/2019   Procedure: INSERTION PORT-A-CATH;  Surgeon: Virl Cagey, MD;  Location: [facility];  Service: General;  Laterality: Left;    Family History  Problem Relation Age of Onset  . Cancer Mother   . Hypertension Mother   . Cancer Father   . Hypertension Father   . Cancer Maternal Grandmother   . Hypertension Sister   . Hypertension Brother   . Prostate cancer Brother   . Colon cancer Neg Hx   . Colon polyps Neg Hx    Social History   Tobacco Use  . Smoking status:  Never Smoker  . Smokeless tobacco: Never Used  Vaping Use  . Vaping Use: Never used  Substance Use Topics  . Alcohol use: No  . Drug use: No    Allergies  Allergen Reactions  . Diclofenac Swelling    Per pt, facial swelling    Current Meds  Medication Sig  . acetaminophen (TYLENOL) 650 MG CR tablet Take 650 mg by mouth every 8 (eight) hours as needed for pain.   Marland Kitchen acyclovir (ZOVIRAX) 400 MG tablet TAKE 1 TABLET BY MOUTH TWICE DAILY  . albuterol (VENTOLIN HFA) 108 (90 Base) MCG/ACT inhaler Inhale 2 puffs into the lungs every 6 (six) hours as needed for wheezing or shortness of breath.  . ALPRAZolam (XANAX) 1 MG tablet Take 1 mg by mouth daily as needed.  . Ascorbic Acid (VITAMIN C) 1000 MG tablet Take 1,000 mg by mouth daily.   Marland Kitchen aspirin EC 81 MG tablet Take 81 mg by mouth daily.  . calcium carbonate (OS-CAL) 1250 (500 Ca) MG chewable tablet Chew 1 tablet by mouth daily.   . cholecalciferol (VITAMIN D) 1000 units tablet Take 1,000 Units by mouth daily.  Marland Kitchen dexamethasone (DECADRON) 4 MG tablet Take 5 tablets once a week  . Elastic Bandages & Supports (MEDICAL COMPRESSION STOCKINGS) MISC See admin instructions.  Regino Schultze Bandages & Supports (WRIST SPLINT) MISC See admin instructions.  Marland Kitchen  furosemide (LASIX) 20 MG tablet 1 tablet  . gabapentin (NEURONTIN) 300 MG capsule Take 1 capsule (300 mg total) by mouth 2 (two) times daily.  Marland Kitchen lidocaine-prilocaine (EMLA) cream Apply a quarter sized glob 1 hour prior to treatment. (Patient taking differently: Apply 1 application topically daily as needed (prior to port access).)  . magnesium oxide (MAG-OX) 400 (241.3 Mg) MG tablet TAKE 1 TABLET BY MOUTH TWICE DAILY  . metolazone (ZAROXOLYN) 2.5 MG tablet Take 1 tablet (2.5 mg total) by mouth daily.  . Multiple Vitamin (TAB-A-VITE) TABS TAKE 1 TABLET BY MOUTH ONCE DAILY. (Patient taking differently: Take 1 tablet by mouth daily.)  . oxyCODONE-acetaminophen (PERCOCET/ROXICET) 5-325 MG tablet Take 1-2  tablets by mouth every 12 (twelve) hours as needed for severe pain.  . pomalidomide (POMALYST) 4 MG capsule Take 1 capsule (4 mg total) by mouth daily.  . potassium chloride SA (KLOR-CON) 20 MEQ tablet Take 1 tablet (20 mEq total) by mouth 3 (three) times daily.  Marland Kitchen trolamine salicylate (ASPERCREME) 10 % cream Apply 1 application topically 2 (two) times daily as needed for muscle pain.     BP (!) 187/95   Pulse 67   Ht 5\' 2"  (1.575 m)   Wt (!) 345 lb (156.5 kg)   BMI 63.10 kg/m  The patient meets the AMA guidelines for Morbid (severe) obesity with a BMI > 40.0 and I have recommended weight loss.  Physical Exam  General appearance: Well-developed well-nourished no gross deformities  Cardiovascular normal pulse and perfusion normal color without edema  Neurologically o sensation loss or deficits or pathologic reflexes  Psychological: Awake alert and oriented x3 mood and affect normal  Skin no lacerations or ulcerations no nodularity no palpable masses, no erythema or nodularity  Musculoskeletal:   Patient is noted to have medial joint line tenderness in both knees limited flexion by the morphology I do not detect any instability in the knee joints and her muscle tone is normal     MEDICAL DECISION MAKING  A.  Encounter Diagnoses  Name Primary?  . Chronic pain of left knee Yes  . Chronic pain of right knee     B. DATA ANALYSED:   IMAGING: Interpretation of images: Internal x-rays show severe arthritis and varus deformity with extensive secondary bone formation in each knee  Orders: Negative  Outside records reviewed: No   C. MANAGEMENT   Procedure note for bilateral knee injections  Procedure note left knee injection verbal consent was obtained to inject left knee joint  Timeout was completed to confirm the site of injection  The medications used were 6 mg Celestone with Sensorcaine Anesthesia was provided by ethyl chloride and the skin was prepped with  alcohol.  After cleaning the skin with alcohol a 20-gauge needle was used to inject the left knee joint. There were no complications. A sterile bandage was applied.   Procedure note right knee injection verbal consent was obtained to inject right knee joint  Timeout was completed to confirm the site of injection  The medications used were 6 mg Celestone with Sensorcaine Anesthesia was provided by ethyl chloride and the skin was prepped with alcohol.  After cleaning the skin with alcohol a 20-gauge needle was used to inject the right knee joint. There were no complications. A sterile bandage was applied.   No orders of the defined types were placed in this encounter.     Arther Abbott, MD  06/03/2020 4:15 PM

## 2020-06-05 ENCOUNTER — Other Ambulatory Visit (HOSPITAL_COMMUNITY): Payer: Self-pay | Admitting: Hematology

## 2020-06-05 DIAGNOSIS — C9000 Multiple myeloma not having achieved remission: Secondary | ICD-10-CM

## 2020-06-07 ENCOUNTER — Telehealth: Payer: Self-pay | Admitting: Orthopedic Surgery

## 2020-06-07 NOTE — Telephone Encounter (Signed)
I called her to advise continue with current meds. She voiced understanding.

## 2020-06-07 NOTE — Telephone Encounter (Signed)
She has oxycodone

## 2020-06-07 NOTE — Telephone Encounter (Signed)
Patient called states Dr. Aline Brochure was suppose to call her medicine in last week and didn't.   Please call her back at [place]

## 2020-06-14 ENCOUNTER — Other Ambulatory Visit (HOSPITAL_COMMUNITY): Payer: Self-pay

## 2020-06-14 MED ORDER — POMALIDOMIDE 4 MG PO CAPS: 4.0000 mg | ORAL_CAPSULE | Freq: Every day | ORAL | 0 refills | Status: DC

## 2020-06-14 NOTE — Telephone Encounter (Signed)
Chart reviewed. Pomalyst refilled per Dr. Delton Coombes

## 2020-07-06 ENCOUNTER — Inpatient Hospital Stay (HOSPITAL_COMMUNITY): Payer: Medicare Other

## 2020-07-06 ENCOUNTER — Other Ambulatory Visit: Payer: Self-pay

## 2020-07-06 ENCOUNTER — Inpatient Hospital Stay (HOSPITAL_COMMUNITY): Payer: Medicare Other | Attending: Hematology

## 2020-07-06 ENCOUNTER — Other Ambulatory Visit (HOSPITAL_COMMUNITY): Payer: Self-pay

## 2020-07-06 VITALS — BP 112/63 | HR 83 | Temp 97.0°F | Resp 20

## 2020-07-06 DIAGNOSIS — G8929 Other chronic pain: Secondary | ICD-10-CM

## 2020-07-06 DIAGNOSIS — C9000 Multiple myeloma not having achieved remission: Secondary | ICD-10-CM | POA: Diagnosis present

## 2020-07-06 DIAGNOSIS — M25562 Pain in left knee: Secondary | ICD-10-CM

## 2020-07-06 LAB — CBC WITH DIFFERENTIAL/PLATELET
Abs Immature Granulocytes: 0.1 10*3/uL — ABNORMAL HIGH (ref 0.00–0.07)
Basophils Absolute: 0 10*3/uL (ref 0.0–0.1)
Basophils Relative: 1 %
Eosinophils Absolute: 0 10*3/uL (ref 0.0–0.5)
Eosinophils Relative: 1 %
HCT: 33.5 % — ABNORMAL LOW (ref 36.0–46.0)
Hemoglobin: 10.6 g/dL — ABNORMAL LOW (ref 12.0–15.0)
Immature Granulocytes: 2 %
Lymphocytes Relative: 16 %
Lymphs Abs: 0.8 10*3/uL (ref 0.7–4.0)
MCH: 29.9 pg (ref 26.0–34.0)
MCHC: 31.6 g/dL (ref 30.0–36.0)
MCV: 94.4 fL (ref 80.0–100.0)
Monocytes Absolute: 0.3 10*3/uL (ref 0.1–1.0)
Monocytes Relative: 7 %
Neutro Abs: 3.4 10*3/uL (ref 1.7–7.7)
Neutrophils Relative %: 73 %
Platelets: 187 10*3/uL (ref 150–400)
RBC: 3.55 MIL/uL — ABNORMAL LOW (ref 3.87–5.11)
RDW: 14.4 % (ref 11.5–15.5)
WBC: 4.7 10*3/uL (ref 4.0–10.5)
nRBC: 0 % (ref 0.0–0.2)

## 2020-07-06 LAB — COMPREHENSIVE METABOLIC PANEL
ALT: 13 U/L (ref 0–44)
AST: 14 U/L — ABNORMAL LOW (ref 15–41)
Albumin: 3.5 g/dL (ref 3.5–5.0)
Alkaline Phosphatase: 52 U/L (ref 38–126)
Anion gap: 7 (ref 5–15)
BUN: 23 mg/dL (ref 8–23)
CO2: 28 mmol/L (ref 22–32)
Calcium: 8.8 mg/dL — ABNORMAL LOW (ref 8.9–10.3)
Chloride: 101 mmol/L (ref 98–111)
Creatinine, Ser: 1.29 mg/dL — ABNORMAL HIGH (ref 0.44–1.00)
GFR, Estimated: 47 mL/min — ABNORMAL LOW (ref >60)
Glucose, Bld: 122 mg/dL — ABNORMAL HIGH (ref 70–99)
Potassium: 3.9 mmol/L (ref 3.5–5.1)
Sodium: 136 mmol/L (ref 135–145)
Total Bilirubin: 0.7 mg/dL (ref 0.3–1.2)
Total Protein: 6.9 g/dL (ref 6.5–8.1)

## 2020-07-06 LAB — MAGNESIUM: Magnesium: 1.9 mg/dL (ref 1.7–2.4)

## 2020-07-06 MED ORDER — OXYCODONE-ACETAMINOPHEN 5-325 MG PO TABS: 1.0000 | ORAL_TABLET | Freq: Two times a day (BID) | ORAL | 0 refills | Status: DC | PRN

## 2020-07-06 MED ORDER — HEPARIN SOD (PORK) LOCK FLUSH 100 UNIT/ML IV SOLN
500.0000 [IU] | Freq: Once | INTRAVENOUS | Status: AC
Start: 2020-07-06 — End: 2020-07-06
  Administered 2020-07-06: 500 [IU] via INTRAVENOUS

## 2020-07-06 MED ORDER — SODIUM CHLORIDE 0.9% FLUSH
10.0000 mL | INTRAVENOUS | Status: DC | PRN
  Administered 2020-07-06: 10 mL via INTRAVENOUS

## 2020-07-06 NOTE — Progress Notes (Signed)
Patients port flushed without difficulty.  No blood return noted.  NO bruising or swelling at the site.  Band aid applied.  Labs were attempted peripherally with no success.   Patient was sent to the lab for blood draw.  VSS with discharge and left in satisfactory condition with no s/s of distress noted.

## 2020-07-07 LAB — KAPPA/LAMBDA LIGHT CHAINS
Kappa free light chain: 39 mg/L — ABNORMAL HIGH (ref 3.3–19.4)
Kappa, lambda light chain ratio: 1.67 — ABNORMAL HIGH (ref 0.26–1.65)
Lambda free light chains: 23.4 mg/L (ref 5.7–26.3)

## 2020-07-08 ENCOUNTER — Other Ambulatory Visit (HOSPITAL_COMMUNITY): Payer: Self-pay

## 2020-07-08 LAB — PROTEIN ELECTROPHORESIS, SERUM
A/G Ratio: 1.3 (ref 0.7–1.7)
Albumin ELP: 3.5 g/dL (ref 2.9–4.4)
Alpha-1-Globulin: 0.3 g/dL (ref 0.0–0.4)
Alpha-2-Globulin: 0.7 g/dL (ref 0.4–1.0)
Beta Globulin: 1 g/dL (ref 0.7–1.3)
Gamma Globulin: 0.7 g/dL (ref 0.4–1.8)
Globulin, Total: 2.7 g/dL (ref 2.2–3.9)
M-Spike, %: 0.2 g/dL — ABNORMAL HIGH
Total Protein ELP: 6.2 g/dL (ref 6.0–8.5)

## 2020-07-08 MED ORDER — POMALIDOMIDE 4 MG PO CAPS: 4.0000 mg | ORAL_CAPSULE | Freq: Every day | ORAL | 0 refills | Status: DC

## 2020-07-08 NOTE — Telephone Encounter (Signed)
Chart reviewed. Pomalyst refilled per Dr. Delton Coombes

## 2020-07-12 ENCOUNTER — Inpatient Hospital Stay (HOSPITAL_COMMUNITY): Payer: Medicare Other | Attending: Hematology | Admitting: Nurse Practitioner

## 2020-07-12 ENCOUNTER — Other Ambulatory Visit: Payer: Self-pay

## 2020-07-12 VITALS — BP 131/70 | HR 91 | Temp 97.0°F | Resp 20 | Wt 350.1 lb

## 2020-07-12 DIAGNOSIS — Z8042 Family history of malignant neoplasm of prostate: Secondary | ICD-10-CM | POA: Insufficient documentation

## 2020-07-12 DIAGNOSIS — Z79899 Other long term (current) drug therapy: Secondary | ICD-10-CM | POA: Insufficient documentation

## 2020-07-12 DIAGNOSIS — D630 Anemia in neoplastic disease: Secondary | ICD-10-CM | POA: Insufficient documentation

## 2020-07-12 DIAGNOSIS — C9000 Multiple myeloma not having achieved remission: Secondary | ICD-10-CM | POA: Diagnosis not present

## 2020-07-12 DIAGNOSIS — I129 Hypertensive chronic kidney disease with stage 1 through stage 4 chronic kidney disease, or unspecified chronic kidney disease: Secondary | ICD-10-CM | POA: Insufficient documentation

## 2020-07-12 DIAGNOSIS — Z7952 Long term (current) use of systemic steroids: Secondary | ICD-10-CM | POA: Insufficient documentation

## 2020-07-12 DIAGNOSIS — G629 Polyneuropathy, unspecified: Secondary | ICD-10-CM | POA: Insufficient documentation

## 2020-07-12 DIAGNOSIS — Z809 Family history of malignant neoplasm, unspecified: Secondary | ICD-10-CM | POA: Insufficient documentation

## 2020-07-12 DIAGNOSIS — N183 Chronic kidney disease, stage 3 unspecified: Secondary | ICD-10-CM | POA: Insufficient documentation

## 2020-07-12 DIAGNOSIS — Z8249 Family history of ischemic heart disease and other diseases of the circulatory system: Secondary | ICD-10-CM | POA: Insufficient documentation

## 2020-07-12 DIAGNOSIS — Z8719 Personal history of other diseases of the digestive system: Secondary | ICD-10-CM | POA: Insufficient documentation

## 2020-07-12 NOTE — Progress Notes (Signed)
[facility] [address] [place] [address]   CLINIC:  Medical Oncology/Hematology  PCP:  Alfonse Flavors, MD [address] [place] / [place] [address]  [phone]  REASON FOR VISIT:  Follow-up for multiple myeloma  PRIOR THERAPY:  1. Bortezomib x 8 cycles from 10/05/2014 to 04/06/2016. 2. Carfilzomib x 6 cycles from 03/05/2019 to 08/06/2019.  CURRENT THERAPY: Pomalyst 4 mg 3/4 weeks  INTERVAL HISTORY:  [name], a 65 y.o. female, returns for routine follow-up for her multiple myeloma. Kelli Hurley was last seen 05/31/2020.  Today, she reports feeling well.  She is taking Pomalyst 3 weeks on and 1 week off.  She is currently on her third week but is scheduled to be off next week.  At her last visit, Decadron was added weekly.  She has not noticed the swelling is any better or worse when she is off Decadron versus when she is taking it.  She has chronic numbness of her hands and feet for which she takes gabapentin.  Appetite is good.  Fatigue is stable.  No other complaints  Today she reports feeling okay. She is taking Pomalyst 3 weeks on with 1 week off and tolerating it well; she will begin her next bottle on 04/25. She denies having any new cough, SOB, or new pains. She is not taking Decadron as it was making her swollen; she is taking metolazone 2.5 mg daily. She complains of having pain in her right arm and knees and numbness in her feet. Her appetite is excellent.   REVIEW OF SYSTEMS:  Review of Systems  Constitutional: Positive for fatigue. Negative for appetite change and unexpected weight change.  HENT:   Negative for mouth sores, sore throat and trouble swallowing.   Respiratory: Positive for shortness of breath. Negative for chest tightness.   Cardiovascular: Positive for leg swelling.  Gastrointestinal: Negative for abdominal pain, constipation, diarrhea, nausea and vomiting.  Genitourinary: Negative for bladder incontinence and  dysuria.   Musculoskeletal: Positive for arthralgias. Negative for flank pain and neck stiffness.  Skin: Negative for itching, rash and wound.  Neurological: Negative for dizziness, headaches, light-headedness and numbness.  Psychiatric/Behavioral: Negative for confusion, depression and sleep disturbance. The patient is not nervous/anxious.     PAST MEDICAL/SURGICAL HISTORY:  Past Medical History:  Diagnosis Date  . Anemia   . Arthritis   . Cancer ([place])   . Claustrophobia 10/05/2014  . Hypertension   . Leukopenia 08/03/2014  . Normocytic hypochromic anemia 08/03/2014  . Renal disorder    stage 3    Past Surgical History:  Procedure Laterality Date  . ABDOMINAL HYSTERECTOMY    . CARDIAC SURGERY     52 months old. States she had a leaky valve.   . COLONOSCOPY  03/26/2009   Dr. Oneida Alar; normal colon, small internal hemorrhoids.  Recommended repeat colonoscopy in 10 years.  . COLONOSCOPY WITH PROPOFOL N/A 11/25/2019   Procedure: COLONOSCOPY WITH PROPOFOL;  Surgeon: Eloise Harman, DO;  Location: [facility];  Service: Endoscopy;  Laterality: N/A;  10:45am  . OTHER SURGICAL HISTORY     heart surgery as infant to "repair hole in heart"  . PORTACATH PLACEMENT Left 02/21/2019   Procedure: INSERTION PORT-A-CATH;  Surgeon: Virl Cagey, MD;  Location: [facility] ORS;  Service: General;  Laterality: Left;    SOCIAL HISTORY:  Social History   Socioeconomic History  . Marital status: Legally Separated    Spouse name: Not on file  .  Number of children: Not on file  . Years of education: Not on file  . Highest education level: Not on file  Occupational History  . Not on file  Tobacco Use  . Smoking status: Never Smoker  . Smokeless tobacco: Never Used  Vaping Use  . Vaping Use: Never used  Substance and Sexual Activity  . Alcohol use: No  . Drug use: No  . Sexual activity: Not on file    Comment: divorced- 2 daughters  Other Topics Concern  . Not on file  Social History  Narrative  . Not on file   Social Determinants of Health   Financial Resource Strain: Low Risk   . Difficulty of Paying Living Expenses: Not hard at all  Food Insecurity: No Food Insecurity  . Worried About Charity fundraiser in the Last Year: Never true  . Ran Out of Food in the Last Year: Never true  Transportation Needs: No Transportation Needs  . Lack of Transportation (Medical): No  . Lack of Transportation (Non-Medical): No  Physical Activity: Inactive  . Days of Exercise per Week: 0 days  . Minutes of Exercise per Session: 0 min  Stress: No Stress Concern Present  . Feeling of Stress : Not at all  Social Connections: Unknown  . Frequency of Communication with Friends and Family: More than three times a week  . Frequency of Social Gatherings with Friends and Family: Once a week  . Attends Religious Services: More than 4 times per year  . Active Member of Clubs or Organizations: No  . Attends Archivist Meetings: Never  . Marital Status: Not on file  Intimate Partner Violence: Not At Risk  . Fear of Current or Ex-Partner: No  . Emotionally Abused: No  . Physically Abused: No  . Sexually Abused: No    FAMILY HISTORY:  Family History  Problem Relation Age of Onset  . Cancer Mother   . Hypertension Mother   . Cancer Father   . Hypertension Father   . Cancer Maternal Grandmother   . Hypertension Sister   . Hypertension Brother   . Prostate cancer Brother   . Colon cancer Neg Hx   . Colon polyps Neg Hx     CURRENT MEDICATIONS:  Current Outpatient Medications  Medication Sig Dispense Refill  . acetaminophen (TYLENOL) 650 MG CR tablet Take 650 mg by mouth every 8 (eight) hours as needed for pain.     Marland Kitchen acyclovir (ZOVIRAX) 400 MG tablet TAKE 1 TABLET BY MOUTH TWICE DAILY 60 tablet 4  . albuterol (VENTOLIN HFA) 108 (90 Base) MCG/ACT inhaler Inhale 2 puffs into the lungs every 6 (six) hours as needed for wheezing or shortness of breath. 8 g 3  . ALPRAZolam  (XANAX) 1 MG tablet Take 1 mg by mouth daily as needed.    . Ascorbic Acid (VITAMIN C) 1000 MG tablet Take 1,000 mg by mouth daily.     Marland Kitchen aspirin EC 81 MG tablet Take 81 mg by mouth daily.    . calcium carbonate (OS-CAL) 1250 (500 Ca) MG chewable tablet Chew 1 tablet by mouth daily.     . cholecalciferol (VITAMIN D) 1000 units tablet Take 1,000 Units by mouth daily.    Marland Kitchen dexamethasone (DECADRON) 4 MG tablet Take 5 tablets once a week 30 tablet 3  . Elastic Bandages & Supports (MEDICAL COMPRESSION STOCKINGS) MISC See admin instructions.    Regino Schultze Bandages & Supports (WRIST SPLINT) MISC See admin instructions.    Marland Kitchen  furosemide (LASIX) 20 MG tablet 1 tablet    . gabapentin (NEURONTIN) 300 MG capsule Take 1 capsule (300 mg total) by mouth 2 (two) times daily. 60 capsule 6  . losartan (COZAAR) 25 MG tablet Take 12.5 mg by mouth daily.    . magnesium oxide (MAG-OX) 400 (240 Mg) MG tablet Take 1 tablet by mouth 2 (two) times daily.    . metolazone (ZAROXOLYN) 2.5 MG tablet Take 1 tablet (2.5 mg total) by mouth daily. 30 tablet 2  . Multiple Vitamin (TAB-A-VITE) TABS TAKE 1 TABLET BY MOUTH ONCE DAILY. (Patient taking differently: Take 1 tablet by mouth daily.) 30 tablet 11  . oxyCODONE-acetaminophen (PERCOCET/ROXICET) 5-325 MG tablet Take 1-2 tablets by mouth every 12 (twelve) hours as needed for severe pain. 60 tablet 0  . pomalidomide (POMALYST) 4 MG capsule Take 1 capsule (4 mg total) by mouth daily. 21 capsule 0  . potassium chloride SA (KLOR-CON) 20 MEQ tablet TAKE (1) TABLET BY MOUTH THREE TIMES DAILY 90 tablet 3  . trolamine salicylate (ASPERCREME) 10 % cream Apply 1 application topically 2 (two) times daily as needed for muscle pain.     Marland Kitchen lidocaine-prilocaine (EMLA) cream Apply a quarter sized glob 1 hour prior to treatment. (Patient not taking: Reported on 07/12/2020) 30 g 5   No current facility-administered medications for this visit.   Facility-Administered Medications Ordered in Other  Visits  Medication Dose Route Frequency Provider Last Rate Last Admin  . heparin lock flush 100 unit/mL  500 Units Intravenous Once Penland, Larene Beach K, MD      . sodium chloride 0.9 % injection 10 mL  10 mL Intravenous Once Penland, Kelby Fam, MD        ALLERGIES:  Allergies  Allergen Reactions  . Diclofenac Swelling    Per pt, facial swelling    PHYSICAL EXAM:  Performance status (ECOG): 1 - Symptomatic but completely ambulatory  Vitals:   07/12/20 1400  BP: 131/70  Pulse: 91  Resp: 20  Temp: (!) 97 F (36.1 C)  SpO2: 95%   Wt Readings from Last 3 Encounters:  07/12/20 (!) 350 lb 1.6 oz (158.8 kg)  06/03/20 (!) 345 lb (156.5 kg)  05/31/20 (!) 349 lb 1.6 oz (158.4 kg)   Physical Exam Vitals (Exam limited due to telemedicine) reviewed.  Constitutional:      General: She is not in acute distress. HENT:     Head: Normocephalic.  Pulmonary:     Effort: No respiratory distress.  Neurological:     Mental Status: She is alert and oriented to person, place, and time.  Psychiatric:        Mood and Affect: Mood normal.        Behavior: Behavior normal.     LABORATORY DATA:  I have reviewed the labs as listed.  CBC Latest Ref Rng & Units 07/06/2020 05/24/2020 03/25/2020  WBC 4.0 - 10.5 K/uL 4.7 2.3(L) 4.2  Hemoglobin 12.0 - 15.0 g/dL 10.6(L) 10.0(L) 10.1(L)  Hematocrit 36.0 - 46.0 % 33.5(L) 31.8(L) 31.4(L)  Platelets 150 - 400 K/uL 187 230 265   CMP Latest Ref Rng & Units 07/06/2020 05/24/2020 03/25/2020  Glucose 70 - 99 mg/dL 122(H) 101(H) 123(H)  BUN 8 - 23 mg/dL 23 34(H) 25(H)  Creatinine 0.44 - 1.00 mg/dL 1.29(H) 1.43(H) 1.61(H)  Sodium 135 - 145 mmol/L 136 138 137  Potassium 3.5 - 5.1 mmol/L 3.9 3.9 3.7  Chloride 98 - 111 mmol/L 101 102 100  CO2 22 - 32 mmol/L  _0 Calcium 8.9 - 10.3 mg/dL 8.8(L) 9.6 9.5  Total Protein 6.5 - 8.1 g/dL 6.9 7.3 7.2  Total Bilirubin 0.3 - 1.2 mg/dL 0.7 0.5 0.5  Alkaline Phos 38 - 126 U/L 52 45 45  AST 15 - 41 U/L 14(L) 15 16   ALT 0 - 44 U/L _1 Component Value Date/Time   RBC 3.55 (L) 07/06/2020 0954   MCV 94.4 07/06/2020 0954   MCH 29.9 07/06/2020 0954   MCHC 31.6 07/06/2020 0954   RDW 14.4 07/06/2020 0954   LYMPHSABS 0.8 07/06/2020 0954   MONOABS 0.3 07/06/2020 0954   EOSABS 0.0 07/06/2020 0954   BASOSABS 0.0 07/06/2020 0954   Lab Results  Component Value Date   TOTALPROTELP 6.2 07/06/2020   ALBUMINELP 3.5 07/06/2020   A1GS 0.3 07/06/2020   A2GS 0.7 07/06/2020   BETS 1.0 07/06/2020   GAMS 0.7 07/06/2020   MSPIKE 0.2 (H) 07/06/2020   SPEI Comment 07/06/2020    Lab Results  Component Value Date   KPAFRELGTCHN 39.0 (H) 07/06/2020   LAMBDASER 23.4 07/06/2020   KAPLAMBRATIO 1.67 (H) 07/06/2020    DIAGNOSTIC IMAGING:  I have independently reviewed the scans and discussed with the patient. No results found.   ASSESSMENT:  1. Relapsed IgG kappa multiple myeloma with high risk features: -5cycles of carfilzomib, pomalidomide and dexamethasone from 03/05/2019 through 06/25/2019. -Myeloma panel on 07/09/2019 shows M spike 0.3 g, slightly up from 0.2 g previously. However free light chain ratio has 1.37 and improved. Kappa light chains are 24.8. -Carfilzomib discontinued after 08/06/2019 secondary to fluid retention. - She is currently on Pomalyst 4 mg 3 weeks on 1 week off. - PET scan on 03/22/2020 with no evidence of malignancy. - Bone marrow biopsy on 03/19/2020 showed approximately 5% plasma cells.  Chromosome analysis and FISH panel were normal.  2. Shortness of breath on exertion: -2D echo on 02/09/2019 shows EF 60-65%. No LVH. -Chest CT PE protocol on 02/24/2019 was negative. -Cardiac MRI on 03/07/2019 shows LVEF 56% with no amyloid. Troponin T and proBNP were negative. -PFTs show low expiratory reserve volume consistent with body habitus. Moderate diffusion defect which corrects to normal. FVC, FEV1, FEV1/FVC ratio are within normal limits. FVC is reduced relative to SVC  indicates air-trapping. No significant response after bronchodilators. Reduced diffusion capacity indicates moderate loss of functional alveolar capillary space. -She was evaluated by Dr.Wert. -2D echocardiogram on 08/27/2019 shows LVEF 60 to 65%. No LVH.  3. Myeloma bone disease: -Bone density on 07/04/2019 shows T score 1.8. - Zometa held d/t worsening renal function - she has completed 2 years of treatment off and on - continue to hold  PLAN:  1. Relapsed IgG kappa multiple myeloma with high risk features: -Reviewed myeloma panel from 07/06/20 - SPEP improved to 0.2g. Free light chain ratio decreased to 1.67 (prev 2.21). Kappa light chain improved to 39 (prev 76.1). Immunofixation shows IgG kappa - Reviewed CBC & CMP which showed wbc 4.7, ANC 3400. Creatinine improved 1.29.  - She is on her third week of pomalyst. Next week is off.  - On decadron 20 mg weekly due to previous increase in M spike - RTC in 2 weeks for labs (cbc, cmp, mg, ldh) and consideration of starting her next cycle of pomalyst.   2. ID prophylaxis - continue acyclovir twice daily and aspirin 81 mg daily  3. Neuropathy  - chronic. Continue gabapentin 300 mg 2-3 times daily  4.  Lower extremity edema:  - continue metolazone on M-W-F.   5. Shortness of breath on exertion - continue bronchodilators and diuretics as prescribed.   6. Hypokalemia - continue potassium 40 meq in morning and 20 meq in afternoon - resolved  8. Bilateral knee pain - secondary to arthritis - continue percocet with bowel prophylaxis  9. Myeloma Bone disease - zometa on hold d/t worsening renal function - she has completed 2 years of treatment off and on  Orders placed this encounter:  Orders Placed This Encounter  Procedures  . CBC with Differential  . Comprehensive metabolic panel  . Magnesium  . Lactate dehydrogenase    Beckey Rutter, NP [place] [phone]

## 2020-07-12 NOTE — Patient Instructions (Signed)
[place] at [facility] Discharge Instructions  You were seen today by Ander Purpura, NP. Lauren reviewed your recent lab work, which looks good! Your M-Spike has come down since April, it is now 0.2, which is down from 0.4. Lauren has recommended continuing Pomalyst and Decadron. Lauren has recommended following up with Dr. Delton Coombes around the beginning of your next cycle of Pomalyst.   Thank you for choosing [place] at [facility] to provide your oncology and hematology care.  To afford each patient quality time with our provider, please arrive at least 15 minutes before your scheduled appointment time.   If you have a lab appointment with the [facility] please come in thru the Main Entrance and check in at the main information desk.  You need to re-schedule your appointment should you arrive 10 or more minutes late.  We strive to give you quality time with our providers, and arriving late affects you and other patients whose appointments are after yours.  Also, if you no show three or more times for appointments you may be dismissed from the clinic at the providers discretion.     Again, thank you for choosing [facility].  Our hope is that these requests will decrease the amount of time that you wait before being seen by our physicians.       _____________________________________________________________  Should you have questions after your visit to [facility], please contact our office at [phone] and follow the prompts.  Our office hours are 8:00 a.m. and 4:30 p.m. Monday - Friday.  Please note that voicemails left after 4:00 p.m. may not be returned until the following business day.  We are closed weekends and major holidays.  You do have access to a nurse 24-7, just call the main number to the clinic [phone] and do not press any options, hold on the line and a nurse will answer the phone.    For  prescription refill requests, have your pharmacy contact our office and allow 72 hours.    Due to Covid, you will need to wear a mask upon entering the hospital. If you do not have a mask, a mask will be given to you at the Main Entrance upon arrival. For doctor visits, patients may have 1 support person age 103 or older with them. For treatment visits, patients can not have anyone with them due to social distancing guidelines and our immunocompromised population.

## 2020-07-26 ENCOUNTER — Encounter: Payer: Self-pay | Admitting: Internal Medicine

## 2020-07-26 ENCOUNTER — Other Ambulatory Visit: Payer: Self-pay

## 2020-07-26 ENCOUNTER — Ambulatory Visit (INDEPENDENT_AMBULATORY_CARE_PROVIDER_SITE_OTHER): Payer: Medicare Other | Admitting: Internal Medicine

## 2020-07-26 DIAGNOSIS — G4734 Idiopathic sleep related nonobstructive alveolar hypoventilation: Secondary | ICD-10-CM

## 2020-07-26 DIAGNOSIS — R0609 Other forms of dyspnea: Secondary | ICD-10-CM

## 2020-07-26 DIAGNOSIS — R0600 Dyspnea, unspecified: Secondary | ICD-10-CM | POA: Diagnosis not present

## 2020-07-26 NOTE — Patient Instructions (Signed)
Make sure you check your oxygen saturation at your highest level of activity to be sure it stays over 90% and keep track of it at least once a week, more often if breathing getting worse, and let me know if losing ground.   PULSE OXIMETER    If you are satisfied with your treatment plan,  let your doctor know and he/she can either refill your medications or you can return here when your prescription runs out.     If in any way you are not 100% satisfied,  please tell us.  If 100% better, tell your friends!  Pulmonary follow up is as needed

## 2020-07-26 NOTE — Progress Notes (Signed)
Kelli Hurley, female    DOB: 04-06-1955,     MRN: [id number]   Brief patient profile:  87  yobf never smoker, never allergies / asthma with onset of sob attributed to  Anemia 2016 and dx MM June 2018 with w/u Dr Luan Pulling she says neg but  rx 02 and 2 inhalers one of which was powder that made her cough but eventually did  Ok on maint rx? zometa? By JQB  s 02 or inhalers with MMRC1 x several years  then more sob again since Nov 2020   at dx of recurrent MM and  restarted proair  "helps until I get up and walk again"  referred to pulmonary clinic 06/27/2019 by Dr   Delton Coombes.     History of Present Illness  06/27/2019  Pulmonary/ 1st office eval/Kelli Hurley  Chief Complaint  Patient presents with   Pulmonary Consult    Referred by Dr. Delton Coombes. Pt c/o SOB since Jan 2021. Pt states has recurrent lung CA.- going through chemo now. She is winded just walking short distances such as lobby to exam room today.   Dyspnea: 50 ft no progression since onset and occurred prior to receiving any chemo  Cough: assoc with noct cough/wheeze  variably discolored > on zpak  Sleep: on side /bed flat  SABA use: doesn't really help at all  rec Check labs  NP eval  07/24/19 rec Stop Hydrochlorothiazide .  Restart Lasix 20mg  daily , take extra if needed for swelling .  Set up Overnight oximetry test  Albuterol Inhaler As needed  Wheezing    08/19/19  patient slept for 8 hours and 43 minutes, testing showing time spent below 88% 4 hours and 6 minutes, lowest oxygen level appears to be 68% rec 2lpm hs  Return with all meds   09/02/2019  f/u ov/Kelli Hurley re: sob better with diuresis / off of chemo as of  08/06/19 / no meds/ does not know names Chief Complaint  Patient presents with   Follow-up    shortness of breat with exertion  Dyspnea: improved  Cough: none Sleeping: bed is flat/ on 2.5 lpm feels she sleeps fine s am ha  SABA use: not using 02: 2.5 lpm hs  Rec No change in my recommendations:  2.5 lpm  oxygen at bedtime  Try albuterol 15 min before an activity that you know would make you short of breath  Also ok to use your albuterol as a rescue medication    07/26/2020  f/u ov/Kelli Hurley office/Kelli Hurley re:  sob mostly MO relaed  Chief Complaint  Patient presents with   Follow-up    Breathing is unchanged. She has occ cough and wheezing. Cough is sometimes prod with clear sputum. She is using her albuterol once per wk on average.     Dyspnea:  rides scooter p hc parking/ walks with  cane  Cough: no Sleeping: bed is flat/ on side with one pillow SABA use: once a week at most, not helping  02: 2.5 lpm hs  Covid status: vax x 3 Lung cancer screening: n/a   No obvious day to day or daytime variability or assoc excess/ purulent sputum or mucus plugs or hemoptysis or cp or chest tightness, subjective wheeze or overt sinus or hb symptoms.   Sleeping ok as long as off back  without nocturnal  or early am exacerbation  of respiratory  c/o's or need for noct saba. Also denies any obvious fluctuation of symptoms with weather or environmental changes  or other aggravating or alleviating factors except as outlined above   No unusual exposure hx or h/o childhood pna/ asthma or knowledge of premature birth.  Current Allergies, Complete Past Medical History, Past Surgical History, Family History, and Social History were reviewed in [employer] record.  ROS  The following are not active complaints unless bolded Hoarseness, sore throat, dysphagia, dental problems, itching, sneezing,  nasal congestion or discharge of excess mucus or purulent secretions, ear ache,   fever, chills, sweats, unintended wt loss or wt gain, classically pleuritic or exertional cp,  orthopnea pnd or arm/hand swelling  or leg swelling, presyncope, palpitations, abdominal pain, anorexia, nausea, vomiting, diarrhea  or change in bowel habits or change in bladder habits, change in stools or change in urine,  dysuria, hematuria,  rash, arthralgias, visual complaints, headache, numbness, weakness or ataxia or problems with walking or coordination,  change in mood or  memory.        Current Meds  Medication Sig   acetaminophen (TYLENOL) 650 MG CR tablet Take 650 mg by mouth every 8 (eight) hours as needed for pain.    acyclovir (ZOVIRAX) 400 MG tablet TAKE 1 TABLET BY MOUTH TWICE DAILY   albuterol (VENTOLIN HFA) 108 (90 Base) MCG/ACT inhaler Inhale 2 puffs into the lungs every 6 (six) hours as needed for wheezing or shortness of breath.   ALPRAZolam (XANAX) 1 MG tablet Take 1 mg by mouth daily as needed.   Ascorbic Acid (VITAMIN C) 1000 MG tablet Take 1,000 mg by mouth daily.    aspirin EC 81 MG tablet Take 81 mg by mouth daily.   calcium carbonate (OS-CAL) 1250 (500 Ca) MG chewable tablet Chew 1 tablet by mouth daily.    cholecalciferol (VITAMIN D) 1000 units tablet Take 1,000 Units by mouth daily.   dexamethasone (DECADRON) 4 MG tablet Take 5 tablets once a week   Elastic Bandages & Supports (MEDICAL COMPRESSION STOCKINGS) MISC See admin instructions.   Elastic Bandages & Supports (WRIST SPLINT) MISC See admin instructions.   furosemide (LASIX) 20 MG tablet 1 tablet   gabapentin (NEURONTIN) 300 MG capsule Take 1 capsule (300 mg total) by mouth 2 (two) times daily.   lidocaine-prilocaine (EMLA) cream Apply a quarter sized glob 1 hour prior to treatment.   losartan (COZAAR) 25 MG tablet Take 12.5 mg by mouth daily.   magnesium oxide (MAG-OX) 400 (240 Mg) MG tablet Take 1 tablet by mouth 2 (two) times daily.   metolazone (ZAROXOLYN) 2.5 MG tablet Take 1 tablet (2.5 mg total) by mouth daily.   Multiple Vitamin (TAB-A-VITE) TABS TAKE 1 TABLET BY MOUTH ONCE DAILY. (Patient taking differently: Take 1 tablet by mouth daily.)   oxyCODONE-acetaminophen (PERCOCET/ROXICET) 5-325 MG tablet Take 1-2 tablets by mouth every 12 (twelve) hours as needed for severe pain.   pomalidomide (POMALYST) 4 MG capsule Take 1  capsule (4 mg total) by mouth daily.   potassium chloride SA (KLOR-CON) 20 MEQ tablet TAKE (1) TABLET BY MOUTH THREE TIMES DAILY   trolamine salicylate (ASPERCREME) 10 % cream Apply 1 application topically 2 (two) times daily as needed for muscle pain.               Past Medical History:  Diagnosis Date   Anemia    Arthritis    Cancer ([place])    Claustrophobia 10/05/2014   Hypertension    Leukopenia 08/03/2014   Normocytic hypochromic anemia 08/03/2014   Renal disorder    stage 3  Objective:       07/26/2020         348  09/02/2019         352 06/27/19 (!) 354 lb (160.6 kg)  06/25/19 (!) 356 lb 9.6 oz (161.8 kg)  06/11/19 (!) 350 lb 12.8 oz (159.1 kg)      Vital signs reviewed  07/26/2020  - Note at rest 02 sats  97% on RA   General appearance:    pleasant obese bf nad    HEENT : pt wearing mask not removed for exam due to covid -19 concerns.    NECK :  without JVD/Nodes/TM/ nl carotid upstrokes bilaterally   LUNGS: no acc muscle use,  Nl contour chest which is clear to A and P bilaterally without cough on insp or exp maneuvers   CV:  RRR  no s3 or murmur or increase in P2, and trace bilateral LE  edema   ABD: Obese/ soft and nontender with nl inspiratory excursion in the supine position. No bruits or organomegaly appreciated, bowel sounds nl  MS:  waddling gait/ ext warm without deformities, calf tenderness, cyanosis or clubbing No obvious joint restrictions   SKIN: warm and dry without lesions    NEURO:  alert, approp, nl sensorium with  no motor or cerebellar deficits apparent.          Assessment

## 2020-07-27 ENCOUNTER — Encounter: Payer: Self-pay | Admitting: Internal Medicine

## 2020-07-27 NOTE — Assessment & Plan Note (Signed)
PFTs 05/20/19 :  ERV  36% c/w effects of obesity on lung volumes   Body mass index is 63.65 kg/m.  -  trending down slightly, encouraged Lab Results  Component Value Date   TSH 0.708 07/02/2019     Contributing to gerd risk/ doe/reviewed the need and the process to achieve and maintain neg calorie balance > defer f/u primary care including intermittently monitoring thyroid status     Each maintenance medication was reviewed in detail including emphasizing most importantly the difference between maintenance and prns and under what circumstances the prns are to be triggered using an action plan format where appropriate.  Total time for H and P, chart review, counseling,  directly observing portions of ambulatory 02 saturation study/ and generating customized AVS unique to this office visit / same day charting = 25 min

## 2020-07-27 NOTE — Assessment & Plan Note (Signed)
Onset 2016 with dx of MM - recurrent doe with dx of recurrent MM Jan 2021 prior to rx  - Echo 02/19/19 nl x for mild LAE  - CTa 02/24/19 no large PE, no ILD  - Neg MR cardiac 03/07/19 neg for amyloid, nl ef  - PFT's  05/20/19   FEV1 1.92 (103 % ) ratio 0.91  p 6 % improvement from saba p nothing prior to study with DLCO  11.73 (62%) corrects to 3.66 (85 %)  for alv volume and FV curve nl and ERV 36%   -  06/27/2019   Walked RA  approx   100 ft  @ slow pace  stopped due to  Sob with sats still 91%   -  07/26/2020   Walked RA  approx   200 ft  @ slow/ waddling pace  stopped due to  Sob and sats 87% "much more than usual activity"      Note mild LAE on last echo with ongoing issues with MO (see separate a/p)   Offered 02 prn for ambulation but based on observations today I strongly doubt it would help her due more and she is not interested in trial at this time anyway.   If sats are falling further would consider pulmonary eval at [facility] where she is being followed for MM and f/u here prn

## 2020-07-27 NOTE — Assessment & Plan Note (Signed)
7/13/21patient slept for 8 hours and 43 minutes, testing showing time spent below 88% 4 hours and 6 minutes, lowest oxygen level appears to be 68% >>> 2lpm hs   Tolerating noct 02 2.5 lpm well > no change rx   Declined ambulatory 02 today but did qualify

## 2020-07-28 ENCOUNTER — Other Ambulatory Visit: Payer: Self-pay

## 2020-07-28 ENCOUNTER — Inpatient Hospital Stay (HOSPITAL_COMMUNITY): Payer: Medicare Other

## 2020-07-28 ENCOUNTER — Encounter (HOSPITAL_COMMUNITY): Payer: Self-pay | Admitting: Hematology and Oncology

## 2020-07-28 ENCOUNTER — Inpatient Hospital Stay (HOSPITAL_BASED_OUTPATIENT_CLINIC_OR_DEPARTMENT_OTHER): Payer: Medicare Other | Admitting: Hematology and Oncology

## 2020-07-28 VITALS — BP 140/70 | HR 89 | Temp 97.2°F | Resp 20 | Wt 348.5 lb

## 2020-07-28 DIAGNOSIS — G629 Polyneuropathy, unspecified: Secondary | ICD-10-CM | POA: Diagnosis not present

## 2020-07-28 DIAGNOSIS — Z7952 Long term (current) use of systemic steroids: Secondary | ICD-10-CM | POA: Diagnosis not present

## 2020-07-28 DIAGNOSIS — N183 Chronic kidney disease, stage 3 unspecified: Secondary | ICD-10-CM | POA: Diagnosis not present

## 2020-07-28 DIAGNOSIS — G8929 Other chronic pain: Secondary | ICD-10-CM

## 2020-07-28 DIAGNOSIS — C9000 Multiple myeloma not having achieved remission: Secondary | ICD-10-CM | POA: Diagnosis not present

## 2020-07-28 DIAGNOSIS — Z8719 Personal history of other diseases of the digestive system: Secondary | ICD-10-CM | POA: Diagnosis not present

## 2020-07-28 DIAGNOSIS — D630 Anemia in neoplastic disease: Secondary | ICD-10-CM

## 2020-07-28 DIAGNOSIS — Z809 Family history of malignant neoplasm, unspecified: Secondary | ICD-10-CM | POA: Diagnosis not present

## 2020-07-28 DIAGNOSIS — M25561 Pain in right knee: Secondary | ICD-10-CM | POA: Diagnosis not present

## 2020-07-28 DIAGNOSIS — M25562 Pain in left knee: Secondary | ICD-10-CM

## 2020-07-28 DIAGNOSIS — Z8042 Family history of malignant neoplasm of prostate: Secondary | ICD-10-CM | POA: Diagnosis not present

## 2020-07-28 DIAGNOSIS — I129 Hypertensive chronic kidney disease with stage 1 through stage 4 chronic kidney disease, or unspecified chronic kidney disease: Secondary | ICD-10-CM | POA: Diagnosis not present

## 2020-07-28 DIAGNOSIS — Z8249 Family history of ischemic heart disease and other diseases of the circulatory system: Secondary | ICD-10-CM | POA: Diagnosis not present

## 2020-07-28 DIAGNOSIS — Z79899 Other long term (current) drug therapy: Secondary | ICD-10-CM | POA: Diagnosis not present

## 2020-07-28 LAB — COMPREHENSIVE METABOLIC PANEL
ALT: 13 U/L (ref 0–44)
AST: 15 U/L (ref 15–41)
Albumin: 3.7 g/dL (ref 3.5–5.0)
Alkaline Phosphatase: 54 U/L (ref 38–126)
Anion gap: 9 (ref 5–15)
BUN: 25 mg/dL — ABNORMAL HIGH (ref 8–23)
CO2: 28 mmol/L (ref 22–32)
Calcium: 9.5 mg/dL (ref 8.9–10.3)
Chloride: 99 mmol/L (ref 98–111)
Creatinine, Ser: 1.3 mg/dL — ABNORMAL HIGH (ref 0.44–1.00)
GFR, Estimated: 46 mL/min — ABNORMAL LOW (ref >60)
Glucose, Bld: 141 mg/dL — ABNORMAL HIGH (ref 70–99)
Potassium: 4.2 mmol/L (ref 3.5–5.1)
Sodium: 136 mmol/L (ref 135–145)
Total Bilirubin: 0.4 mg/dL (ref 0.3–1.2)
Total Protein: 7.2 g/dL (ref 6.5–8.1)

## 2020-07-28 LAB — CBC WITH DIFFERENTIAL/PLATELET
Band Neutrophils: 2 %
Basophils Absolute: 0 10*3/uL (ref 0.0–0.1)
Basophils Relative: 0 %
Eosinophils Absolute: 0 10*3/uL (ref 0.0–0.5)
Eosinophils Relative: 0 %
HCT: 34.2 % — ABNORMAL LOW (ref 36.0–46.0)
Hemoglobin: 10.9 g/dL — ABNORMAL LOW (ref 12.0–15.0)
Lymphocytes Relative: 9 %
Lymphs Abs: 0.6 10*3/uL — ABNORMAL LOW (ref 0.7–4.0)
MCH: 29.8 pg (ref 26.0–34.0)
MCHC: 31.9 g/dL (ref 30.0–36.0)
MCV: 93.4 fL (ref 80.0–100.0)
Metamyelocytes Relative: 2 %
Monocytes Absolute: 0.1 10*3/uL (ref 0.1–1.0)
Monocytes Relative: 2 %
Myelocytes: 2 %
Neutro Abs: 5.8 10*3/uL (ref 1.7–7.7)
Neutrophils Relative %: 83 %
Platelets: 268 10*3/uL (ref 150–400)
RBC: 3.66 MIL/uL — ABNORMAL LOW (ref 3.87–5.11)
RDW: 15 % (ref 11.5–15.5)
WBC: 6.8 10*3/uL (ref 4.0–10.5)
nRBC: 0 % (ref 0.0–0.2)

## 2020-07-28 LAB — MAGNESIUM: Magnesium: 2 mg/dL (ref 1.7–2.4)

## 2020-07-28 LAB — LACTATE DEHYDROGENASE: LDH: 170 U/L (ref 98–192)

## 2020-07-28 MED ORDER — OXYCODONE-ACETAMINOPHEN 5-325 MG PO TABS: 1.0000 | ORAL_TABLET | Freq: Two times a day (BID) | ORAL | 0 refills | Status: DC | PRN

## 2020-07-28 NOTE — Assessment & Plan Note (Signed)

## 2020-07-28 NOTE — Progress Notes (Signed)
Patient is taking pomalyst as prescribed and denies any side effects.

## 2020-07-28 NOTE — Progress Notes (Signed)
[facility] FOLLOW-UP progress notes  Patient Care Team: Zhou-Talbert, Elwyn Lade, MD as PCP - General (Family Medicine) Derek Jack, MD as Consulting Physician (Medical Oncology) Tanda Rockers, MD as Consulting Physician (Pulmonary Disease)  CHIEF COMPLAINTS/PURPOSE OF VISIT:  Multiple myeloma, ongoing treatment  HISTORY OF PRESENTING ILLNESS:  Kelli Hurley 65 y.o. 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 Summary of her history is as follows: Oncology History  Multiple myeloma ([place])  08/07/2014 Imaging   Bone Survey- No lytic lesions are noted in the visualized skeleton.    09/03/2014 Bone Marrow Biopsy   NORMOCELLULAR BONE MARROW FOR AGE WITH PLASMA CELL NEOPLASM.  The plasma cell component is increased in the marrow representing an estimated 18% of all cells. Cytogenetics with 13q-, 17p- (high risk disease)    09/03/2014 Pathology Results   Cytogenetics with 13q-, 17p- (high risk disease)    09/23/2014 Initial Diagnosis   Multiple myeloma    09/30/2014 PET scan   No abnormal hypermetabolism in the neck, chest, abdomen or pelvis.    10/05/2014 - 03/22/2015 Chemotherapy   RVD    10/28/2014 Treatment Plan Change   Issues related to getting Revlimid in a timely fashion, therefore, she received her Revlimid on 9/21 resulting in a 12 day cycle this time instead of a 14 day cycle    11/02/2014 Imaging   CTA chest- No evidence for a large or central pulmonary embolism as described.  8 mm density along the right minor fissure could  represent focal pleural thickening but indeterminate. If the patient is at high risk for bronchogenic carcinoma, follow-up c    11/23/2014 Miscellaneous   Zometa 4 mg IV monthly    04/07/2015 Bone Marrow Biopsy   Normocellular marrow with 2-4% clonal plasma cells by immunohistochemistry. FISH and cytogenetics were normal [facility])      04/2015 Miscellaneous   PRETRANSPLANT EVALUATION:  Pulmonary function tests: FEV1 100.3% / DLCO 97.9%  Echocardiogram: Normal LV function with EF 60-65%     04/27/2015 Procedure   Stem cell mobilization with filgrastim and Mozobil [facility])    05/06/2015 Miscellaneous   BMT conditioning regimen with high-dose Melphalan given [facility] [name]); Day -1    05/07/2015 Bone Marrow Transplant   Outpatient autologous stem cell transplant [facility], [name]); Day 0    05/18/2015 Miscellaneous   WBC engraftment;  did not require platelet transfusion during her transplant process. Lowest platelet count 28,000     05/20/2015 Procedure   Tunneled catheter removed [facility])    09/08/2015 -  Chemotherapy   Velcade every 2 weeks    12/15/2015 Miscellaneous   Zometa re-instituted.     12/23/2015 Imaging   Bone density- BMD as determined from [facility]ius 33% is 0.799 g/cm2 with a T-Score of 1.2. This patient is considered normal according to [facility]) criteria.   04/17/2016 Miscellaneous   Started maintenance with Ninlaro.    05/04/2016 Treatment Plan Change   Zometa switched to Xgeva injection monthly given difficult IV access.     03/05/2019 - 08/06/2019 Chemotherapy   The patient  had carfilzomib (KYPROLIS) 40 mg in dextrose 5 % 50 mL chemo infusion, 18 mg/m2 = 44 mg, Intravenous,  Once, 6 of 6 cycles Dose modification: 56 mg/m2 (original dose 56 mg/m2, Cycle 1, Reason: Provider Judgment) Administration: 40 mg (03/05/2019), 120 mg (03/12/2019), 120 mg (03/20/2019), 120 mg (04/02/2019), 120 mg (04/09/2019), 120 mg (04/16/2019),  120 mg (04/30/2019), 120 mg (05/08/2019), 120 mg (05/14/2019), 120 mg (05/28/2019), 120 mg (06/04/2019), 120 mg (06/11/2019), 120 mg (06/25/2019), 120 mg (07/02/2019), 120 mg (07/09/2019), 120 mg (07/23/2019), 120 mg (07/30/2019), 120 mg (08/06/2019)   for chemotherapy treatment.       MEDICAL HISTORY:  Past Medical History:  Diagnosis Date   Anemia    Arthritis    Cancer ([place])    Claustrophobia 10/05/2014   Hypertension    Leukopenia 08/03/2014   Normocytic hypochromic anemia 08/03/2014   Renal disorder    stage 3     SURGICAL HISTORY: Past Surgical History:  Procedure Laterality Date   ABDOMINAL HYSTERECTOMY     CARDIAC SURGERY     80 months old. States she had a leaky valve.    COLONOSCOPY  03/26/2009   Dr. Oneida Alar; normal colon, small internal hemorrhoids.  Recommended repeat colonoscopy in 10 years.   COLONOSCOPY WITH PROPOFOL N/A 11/25/2019   Procedure: COLONOSCOPY WITH PROPOFOL;  Surgeon: Eloise Harman, DO;  Location: [facility];  Service: Endoscopy;  Laterality: N/A;  10:45am   OTHER SURGICAL HISTORY     heart surgery as infant to "repair hole in heart"   PORTACATH PLACEMENT Left 02/21/2019   Procedure: INSERTION PORT-A-CATH;  Surgeon: Virl Cagey, MD;  Location: [facility] ORS;  Service: General;  Laterality: Left;    SOCIAL HISTORY: Social History   Socioeconomic History   Marital status: Legally Separated    Spouse name: Not on file   Number of children: Not on file   Years of education: Not on file   Highest education level: Not on file  Occupational History   Not on file  Tobacco Use   Smoking status: Never   Smokeless tobacco: Never  Vaping Use   Vaping Use: Never used  Substance and Sexual Activity   Alcohol use: No   Drug use: No   Sexual activity: Not on file    Comment: divorced- 2 daughters  Other Topics Concern   Not on file  Social History Narrative   Not on file   Social Determinants of Health   Financial Resource Strain: Low Risk    Difficulty  of Paying Living Expenses: Not hard at all  Food Insecurity: No Food Insecurity   Worried About Charity fundraiser in the Last Year: Never true   Ran Out of Food in the Last Year: Never true  Transportation Needs: No Transportation Needs   Lack of Transportation (Medical): No   Lack of Transportation (Non-Medical): No  Physical Activity: Inactive   Days of Exercise per Week: 0 days   Minutes of Exercise per Session: 0 min  Stress: No Stress Concern Present   Feeling of Stress : Not at all  Social Connections: Unknown   Frequency of Communication with Friends and Family: More than three times a week   Frequency of Social Gatherings with Friends and Family: Once a week   Attends Religious Services: More than 4 times per year   Active Member of [employer] or Organizations: No   Attends Archivist Meetings: Never   Marital Status: Not on file  Intimate Partner  Violence: Not At Risk   Fear of Current or Ex-Partner: No   Emotionally Abused: No   Physically Abused: No   Sexually Abused: No    FAMILY HISTORY: Family History  Problem Relation Age of Onset   Cancer Mother    Hypertension Mother    Cancer Father    Hypertension Father    Cancer Maternal Grandmother    Hypertension Sister    Hypertension Brother    Prostate cancer Brother    Colon cancer Neg Hx    Colon polyps Neg Hx     ALLERGIES:  is allergic to diclofenac.  MEDICATIONS:  Current Outpatient Medications  Medication Sig Dispense Refill   acetaminophen (TYLENOL) 650 MG CR tablet Take 650 mg by mouth every 8 (eight) hours as needed for pain.      acyclovir (ZOVIRAX) 400 MG tablet TAKE 1 TABLET BY MOUTH TWICE DAILY 60 tablet 4   albuterol (VENTOLIN HFA) 108 (90 Base) MCG/ACT inhaler Inhale 2 puffs into the lungs every 6 (six) hours as needed for wheezing or shortness of breath. 8 g 3   ALPRAZolam (XANAX) 1 MG tablet Take 1 mg by mouth daily as needed.     Ascorbic Acid (VITAMIN C) 1000 MG tablet Take 1,000 mg  by mouth daily.      aspirin EC 81 MG tablet Take 81 mg by mouth daily.     calcium carbonate (OS-CAL) 1250 (500 Ca) MG chewable tablet Chew 1 tablet by mouth daily.      cholecalciferol (VITAMIN D) 1000 units tablet Take 1,000 Units by mouth daily.     dexamethasone (DECADRON) 4 MG tablet Take 5 tablets once a week 30 tablet 3   Elastic Bandages & Supports (MEDICAL COMPRESSION STOCKINGS) MISC See admin instructions.     Elastic Bandages & Supports (WRIST SPLINT) MISC See admin instructions.     furosemide (LASIX) 20 MG tablet 1 tablet     gabapentin (NEURONTIN) 300 MG capsule Take 1 capsule (300 mg total) by mouth 2 (two) times daily. 60 capsule 6   losartan (COZAAR) 25 MG tablet Take 12.5 mg by mouth daily.     magnesium oxide (MAG-OX) 400 (240 Mg) MG tablet Take 1 tablet by mouth 2 (two) times daily.     metolazone (ZAROXOLYN) 2.5 MG tablet Take 1 tablet (2.5 mg total) by mouth daily. 30 tablet 2   Multiple Vitamin (TAB-A-VITE) TABS TAKE 1 TABLET BY MOUTH ONCE DAILY. (Patient taking differently: Take 1 tablet by mouth daily.) 30 tablet 11   pomalidomide (POMALYST) 4 MG capsule Take 1 capsule (4 mg total) by mouth daily. 21 capsule 0   potassium chloride SA (KLOR-CON) 20 MEQ tablet TAKE (1) TABLET BY MOUTH THREE TIMES DAILY 90 tablet 3   trolamine salicylate (ASPERCREME) 10 % cream Apply 1 application topically 2 (two) times daily as needed for muscle pain.      lidocaine-prilocaine (EMLA) cream Apply a quarter sized glob 1 hour prior to treatment. (Patient not taking: Reported on 07/28/2020) 30 g 5   oxyCODONE-acetaminophen (PERCOCET/ROXICET) 5-325 MG tablet Take 1-2 tablets by mouth every 12 (twelve) hours as needed for severe pain. 60 tablet 0   No current facility-administered medications for this visit.   Facility-Administered Medications Ordered in Other Visits  Medication Dose Route Frequency Provider Last Rate Last Admin   heparin lock flush 100 unit/mL  500 Units Intravenous Once  Penland, Kelby Fam, MD       sodium chloride 0.9 % injection 10  mL  10 mL Intravenous Once Penland, Kelby Fam, MD        REVIEW OF SYSTEMS:   Constitutional: Denies fevers, chills or abnormal night sweats Eyes: Denies blurriness of vision, double vision or watery eyes Ears, nose, mouth, throat, and face: Denies mucositis or sore throat Respiratory: Denies cough, dyspnea or wheezes Cardiovascular: Denies palpitation, chest discomfort or lower extremity swelling Gastrointestinal:  Denies nausea, heartburn or change in bowel habits Skin: Denies abnormal skin rashes Lymphatics: Denies new lymphadenopathy or easy bruising Neurological:Denies numbness, tingling or new weaknesses Behavioral/Psych: Mood is stable, no new changes  All other systems were reviewed with the patient and are negative.  PHYSICAL EXAMINATION: ECOG PERFORMANCE STATUS: 1 - Symptomatic but completely ambulatory  Vitals:   07/28/20 0805  BP: 140/70  Pulse: 89  Resp: 20  Temp: (!) 97.2 F (36.2 C)  SpO2: 94%   Filed Weights   07/28/20 0805  Weight: (!) 348 lb 8 oz (158.1 kg)    GENERAL:alert, no distress and comfortable SKIN: skin color, texture, turgor are normal, no rashes or significant lesions EYES: normal, conjunctiva are pink and non-injected, sclera clear OROPHARYNX:no exudate, normal lips, buccal mucosa, and tongue  NECK: supple, thyroid normal size, non-tender, without nodularity LYMPH:  no palpable lymphadenopathy in the cervical, axillary or inguinal LUNGS: clear to auscultation and percussion with normal breathing effort HEART: regular rate & rhythm and no murmurs without lower extremity edema ABDOMEN:abdomen soft, non-tender and normal bowel sounds Musculoskeletal:no cyanosis of digits and no clubbing  PSYCH: alert & oriented x 3 with fluent speech NEURO: no focal motor/sensory deficits  LABORATORY DATA:  I have reviewed the data as listed Lab Results  Component Value Date   WBC 6.8  07/28/2020   HGB 10.9 (L) 07/28/2020   HCT 34.2 (L) 07/28/2020   MCV 93.4 07/28/2020   PLT 268 07/28/2020   Recent Labs    09/10/19 0957 10/08/19 1226 11/05/19 1230 11/26/19 1056 05/24/20 1244 07/06/20 0954 07/28/20 0755  NA 137 135 137   < > 138 136 136  K 3.9 3.3* 3.3*   < > 3.9 3.9 4.2  CL 97* 93* 100   < > 102 101 99  CO2 29 30 27    < > 27 28 28   GLUCOSE 115* 98 130*   < > 101* 122* 141*  BUN 21 32* 25*   < > 34* 23 25*  CREATININE 1.23* 1.70* 1.43*   < > 1.43* 1.29* 1.30*  CALCIUM 9.8 10.2 9.0   < > 9.6 8.8* 9.5  GFRNONAA 47* 32* 39*   < > 41* 47* 46*  GFRAA 54* 37* 45*  --   --   --   --   PROT 7.1 7.8 6.9   < > 7.3 6.9 7.2  ALBUMIN 3.7 3.9 3.6   < > 3.8 3.5 3.7  AST 17 17 19    < > 15 14* 15  ALT 14 13 15    < > 11 13 13   ALKPHOS 49 44 42   < > 45 52 54  BILITOT 0.6 0.8 0.5   < > 0.5 0.7 0.4   < > = values in this interval not displayed.   ASSESSMENT & PLAN:  Multiple myeloma ([place]) She tolerated Pomalyst well except for mild anemia Her last set of myeloma panel showed good response to treatment Renal function is improving I felt that she might be able to get Zometa restarted again next month  CKD (chronic kidney disease)  stage 3, GFR 30-59 ml/min (HCC) She has stable chronic kidney disease I recommend consideration to restart Zometa depending on her next renal function tests I think she can safely be prescribed reduced dose at 3 mg every 6 months The patient appears to be interested but I will defer to Dr. Delton Coombes to decide in her next visit  Anemia in neoplastic disease This is likely due to recent treatment. The patient denies recent history of bleeding such as epistaxis, hematuria or hematochezia. She is asymptomatic from the anemia. I will observe for now.  She does not require transfusion now. I will continue the chemotherapy at current dose without dosage adjustment.  If the anemia gets progressive worse in the future, I might have to delay her treatment  or adjust the chemotherapy dose.   Orders Placed This Encounter  Procedures   Magnesium    Standing Status:   Standing    Number of Occurrences:   10    Standing Expiration Date:   07/28/2021   Lactate dehydrogenase    Standing Status:   Standing    Number of Occurrences:   10    Standing Expiration Date:   07/28/2021   Kappa/lambda light chains    Standing Status:   Standing    Number of Occurrences:   10    Standing Expiration Date:   07/28/2021   Immunofixation electrophoresis    Standing Status:   Standing    Number of Occurrences:   10    Standing Expiration Date:   07/28/2021   Protein electrophoresis, serum    Standing Status:   Standing    Number of Occurrences:   10    Standing Expiration Date:   07/28/2021    All questions were answered. The patient knows to call the clinic with any problems, questions or concerns. The total time spent in the appointment was 20 minutes encounter with patients including review of chart and various tests results, discussions about plan of care and coordination of care plan   Heath Lark, MD 07/28/2020 1:17 PM

## 2020-07-28 NOTE — Assessment & Plan Note (Signed)
She has stable chronic kidney disease I recommend consideration to restart Zometa depending on her next renal function tests I think she can safely be prescribed reduced dose at 3 mg every 6 months The patient appears to be interested but I will defer to Dr. Delton Coombes to decide in her next visit

## 2020-07-28 NOTE — Progress Notes (Signed)
Patient verbalized that her port doesn't give blood easily.  She is returning in 3 weeks for port flush and labs.  Today her labs were drawn peripherally.  She was stuck by 2 RN's unsuccessfully.  Third stick into right lateral AC with 23 gauge butterfly needle.  Patient tolerated lab draw without incidence.  She remained stable during entire time.  Patient is aware of her follow up appointments.  Patient was discharged in stable condition via wheelchair.  She was transported down to her car by our nursing tech.

## 2020-07-28 NOTE — Assessment & Plan Note (Signed)
She tolerated Pomalyst well except for mild anemia Her last set of myeloma panel showed good response to treatment Renal function is improving I felt that she might be able to get Zometa restarted again next month

## 2020-08-05 ENCOUNTER — Other Ambulatory Visit (HOSPITAL_COMMUNITY): Payer: Self-pay

## 2020-08-05 MED ORDER — POMALIDOMIDE 4 MG PO CAPS
4.0000 mg | ORAL_CAPSULE | Freq: Every day | ORAL | 0 refills | Status: DC
Start: 2020-08-05 — End: 2020-09-02

## 2020-08-05 NOTE — Telephone Encounter (Signed)
Chart reviewed. Pomalyst refilled per last office note with Dr. Alvy Bimler

## 2020-08-16 ENCOUNTER — Other Ambulatory Visit (HOSPITAL_COMMUNITY): Payer: Self-pay | Admitting: Hematology

## 2020-08-17 ENCOUNTER — Other Ambulatory Visit (HOSPITAL_COMMUNITY): Payer: Self-pay | Admitting: Hematology

## 2020-08-18 ENCOUNTER — Other Ambulatory Visit: Payer: Self-pay

## 2020-08-18 ENCOUNTER — Inpatient Hospital Stay (HOSPITAL_COMMUNITY): Payer: Medicare Other | Attending: Hematology

## 2020-08-18 DIAGNOSIS — C9000 Multiple myeloma not having achieved remission: Secondary | ICD-10-CM | POA: Diagnosis present

## 2020-08-18 LAB — CBC WITH DIFFERENTIAL/PLATELET
Abs Immature Granulocytes: 0.07 10*3/uL (ref 0.00–0.07)
Basophils Absolute: 0 10*3/uL (ref 0.0–0.1)
Basophils Relative: 0 %
Eosinophils Absolute: 0 10*3/uL (ref 0.0–0.5)
Eosinophils Relative: 0 %
HCT: 32.3 % — ABNORMAL LOW (ref 36.0–46.0)
Hemoglobin: 10.5 g/dL — ABNORMAL LOW (ref 12.0–15.0)
Immature Granulocytes: 1 %
Lymphocytes Relative: 17 %
Lymphs Abs: 1 10*3/uL (ref 0.7–4.0)
MCH: 30.3 pg (ref 26.0–34.0)
MCHC: 32.5 g/dL (ref 30.0–36.0)
MCV: 93.4 fL (ref 80.0–100.0)
Monocytes Absolute: 1 10*3/uL (ref 0.1–1.0)
Monocytes Relative: 17 %
Neutro Abs: 3.8 10*3/uL (ref 1.7–7.7)
Neutrophils Relative %: 65 %
Platelets: 260 10*3/uL (ref 150–400)
RBC: 3.46 MIL/uL — ABNORMAL LOW (ref 3.87–5.11)
RDW: 15.5 % (ref 11.5–15.5)
WBC: 5.8 10*3/uL (ref 4.0–10.5)
nRBC: 0 % (ref 0.0–0.2)

## 2020-08-18 LAB — COMPREHENSIVE METABOLIC PANEL
ALT: 13 U/L (ref 0–44)
AST: 13 U/L — ABNORMAL LOW (ref 15–41)
Albumin: 3.8 g/dL (ref 3.5–5.0)
Alkaline Phosphatase: 47 U/L (ref 38–126)
Anion gap: 7 (ref 5–15)
BUN: 35 mg/dL — ABNORMAL HIGH (ref 8–23)
CO2: 29 mmol/L (ref 22–32)
Calcium: 9.4 mg/dL (ref 8.9–10.3)
Chloride: 99 mmol/L (ref 98–111)
Creatinine, Ser: 1.21 mg/dL — ABNORMAL HIGH (ref 0.44–1.00)
GFR, Estimated: 50 mL/min — ABNORMAL LOW (ref >60)
Glucose, Bld: 120 mg/dL — ABNORMAL HIGH (ref 70–99)
Potassium: 3.7 mmol/L (ref 3.5–5.1)
Sodium: 135 mmol/L (ref 135–145)
Total Bilirubin: 0.6 mg/dL (ref 0.3–1.2)
Total Protein: 6.9 g/dL (ref 6.5–8.1)

## 2020-08-18 LAB — MAGNESIUM: Magnesium: 2 mg/dL (ref 1.7–2.4)

## 2020-08-18 LAB — LACTATE DEHYDROGENASE: LDH: 151 U/L (ref 98–192)

## 2020-08-18 MED ORDER — SODIUM CHLORIDE 0.9% FLUSH
10.0000 mL | INTRAVENOUS | Status: DC | PRN
  Administered 2020-08-18: 10 mL via INTRAVENOUS

## 2020-08-18 MED ORDER — HEPARIN SOD (PORK) LOCK FLUSH 100 UNIT/ML IV SOLN
500.0000 [IU] | Freq: Once | INTRAVENOUS | Status: AC
  Administered 2020-08-18: 500 [IU] via INTRAVENOUS

## 2020-08-19 LAB — KAPPA/LAMBDA LIGHT CHAINS
Kappa free light chain: 20.8 mg/L — ABNORMAL HIGH (ref 3.3–19.4)
Kappa, lambda light chain ratio: 1.58 (ref 0.26–1.65)
Lambda free light chains: 13.2 mg/L (ref 5.7–26.3)

## 2020-08-20 LAB — PROTEIN ELECTROPHORESIS, SERUM
A/G Ratio: 1.3 (ref 0.7–1.7)
Albumin ELP: 3.6 g/dL (ref 2.9–4.4)
Alpha-1-Globulin: 0.2 g/dL (ref 0.0–0.4)
Alpha-2-Globulin: 0.6 g/dL (ref 0.4–1.0)
Beta Globulin: 1 g/dL (ref 0.7–1.3)
Gamma Globulin: 0.8 g/dL (ref 0.4–1.8)
Globulin, Total: 2.7 g/dL (ref 2.2–3.9)
M-Spike, %: 0.2 g/dL — ABNORMAL HIGH
Total Protein ELP: 6.3 g/dL (ref 6.0–8.5)

## 2020-08-23 LAB — IMMUNOFIXATION ELECTROPHORESIS
IgA: 209 mg/dL (ref 87–352)
IgG (Immunoglobin G), Serum: 840 mg/dL (ref 586–1602)
IgM (Immunoglobulin M), Srm: 105 mg/dL (ref 26–217)
Total Protein ELP: 6.3 g/dL (ref 6.0–8.5)

## 2020-08-24 NOTE — Progress Notes (Signed)
Kelli Hurley [address] [place] [address]   CLINIC:  Medical Oncology/Hematology  PCP:  Kelli Flavors, MD [address] [place] / [place] [address] [phone]   REASON FOR VISIT:  Follow-up for multiple myeloma  PRIOR THERAPY:  1. Bortezomib x 8 cycles from 10/05/2014 to 04/06/2016. 2. Carfilzomib x 6 cycles from 03/05/2019 to 08/06/2019.  NGS Results: not done  CURRENT THERAPY: Pomalyst 4 mg 3/4 weeks  BRIEF ONCOLOGIC HISTORY:  Oncology History  Multiple myeloma (Kelli Hurley)  08/07/2014 Imaging   Bone Survey- No lytic lesions are noted in Kelli visualized skeleton.    09/03/2014 Bone Marrow Biopsy   NORMOCELLULAR BONE MARROW FOR AGE WITH PLASMA CELL NEOPLASM.  Kelli plasma cell component is increased in Kelli marrow representing an estimated 18% of all cells. Cytogenetics with 13q-, 17p- (high risk disease)    09/03/2014 Pathology Results   Cytogenetics with 13q-, 17p- (high risk disease)    09/23/2014 Initial Diagnosis   Multiple myeloma    09/30/2014 PET scan   No abnormal hypermetabolism in Kelli neck, chest, abdomen or pelvis.    10/05/2014 - 03/22/2015 Chemotherapy   RVD    10/28/2014 Treatment Plan Change   Issues related to getting Revlimid in a timely fashion, therefore, she received her Revlimid on 9/21 resulting in a 12 day cycle this time instead of a 14 day cycle    11/02/2014 Imaging   CTA chest- No evidence for a large or central pulmonary embolism as described.  8 mm density along Kelli right minor fissure could represent focal pleural thickening but indeterminate. If Kelli patient is at high risk for bronchogenic carcinoma, follow-up c    11/23/2014 Miscellaneous   Zometa 4 mg IV monthly    04/07/2015 Bone Marrow Biopsy   Normocellular marrow with 2-4% clonal plasma cells by immunohistochemistry. FISH and cytogenetics were normal Kelli Hurley)      04/2015 Miscellaneous   PRETRANSPLANT EVALUATION:  Pulmonary function tests: FEV1 100.3%  / DLCO 97.9%  Echocardiogram: Normal LV function with EF 60-65%     04/27/2015 Procedure   Stem cell mobilization with filgrastim and Mozobil Kelli Hurley)    05/06/2015 Miscellaneous   BMT conditioning regimen with high-dose Melphalan given Kelli Hurley, Kelli Hurley); Day -1    05/07/2015 Bone Marrow Transplant   Outpatient autologous stem cell transplant Kelli Hurley, Kelli Hurley); Day 0    05/18/2015 Miscellaneous   WBC engraftment;  did not require platelet transfusion during her transplant process. Lowest platelet count 28,000     05/20/2015 Procedure   Tunneled catheter removed Kelli Hurley)    09/08/2015 -  Chemotherapy   Velcade every 2 weeks    12/15/2015 Miscellaneous   Zometa re-instituted.     12/23/2015 Imaging   Bone density- BMD as determined from Forearm Radius 33% is 0.799 g/cm2 with a T-Score of 1.2. This patient is considered normal according to Kelli Hurley) criteria.   04/17/2016 Miscellaneous   Started maintenance with Ninlaro.    05/04/2016 Treatment Plan Change   Zometa switched to Xgeva injection monthly given difficult IV access.     03/05/2019 - 08/06/2019 Chemotherapy   Kelli patient had carfilzomib (KYPROLIS) 40 mg in dextrose 5 % 50 mL chemo infusion, 18 mg/m2 = 44 mg, Intravenous,  Once, 6 of 6 cycles Dose modification: 56 mg/m2 (original dose 56 mg/m2, Cycle 1, Reason: Provider Judgment) Administration: 40 mg (03/05/2019), 120 mg (03/12/2019), 120 mg (03/20/2019), 120 mg (04/02/2019), 120 mg (04/09/2019), 120 mg (04/16/2019),  120 mg (04/30/2019), 120 mg (05/08/2019), 120 mg (05/14/2019), 120 mg (05/28/2019), 120 mg (06/04/2019), 120 mg (06/11/2019), 120 mg (06/25/2019), 120 mg (07/02/2019), 120 mg (07/09/2019), 120 mg (07/23/2019), 120 mg (07/30/2019), 120 mg (08/06/2019)   for chemotherapy treatment.       Hurley STAGING: Hurley Staging Multiple myeloma Kelli Hurley) Staging form: Multiple Myeloma, AJCC 6th Edition - Clinical stage from 10/26/2014: Stage IIA - Signed  by Kelli Cancer, PA-C on 10/26/2014 - Pathologic: No stage assigned - Unsigned  Virtual Visit via Video Note  I connected with Kelli Hurley on _0 @ at 10:15 AM EDT by a video enabled telemedicine application and verified that I am speaking with Kelli correct person using two identifiers.  Location: Patient: clinic Provider: home  Others present: Kelli Rolls, RN   I discussed Kelli limitations of evaluation and management by telemedicine and Kelli availability of in person appointments. Kelli patient expressed understanding and agreed to proceed.  INTERVAL HISTORY:  Kelli Hurley, a 65 y.o. female, returns for routine follow-up of her multiple myeloma. Kelli Hurley was last seen on 05/31/20.   Today she reports feeling good. She reports unchanged SOB upon exertion.  She is continuing metolazone 3 times weekly.  Denies any additional leg swelling.  She is taking dexamethasone 20 mg weekly.  She has a follow-up appointment with her kidney doctor this Friday.  REVIEW OF SYSTEMS:  Review of Systems  Respiratory:  Positive for shortness of breath (w/ exertion).    PAST MEDICAL/SURGICAL HISTORY:  Past Medical History:  Diagnosis Date   Anemia    Arthritis    Hurley (Kelli Hurley)    Claustrophobia 10/05/2014   Hypertension    Leukopenia 08/03/2014   Normocytic hypochromic anemia 08/03/2014   Renal disorder    stage 3    Past Surgical History:  Procedure Laterality Date   ABDOMINAL HYSTERECTOMY     CARDIAC SURGERY     11 months old. States she had a leaky valve.    COLONOSCOPY  03/26/2009   Dr. Oneida Hurley; normal colon, small internal hemorrhoids.  Recommended repeat colonoscopy in 10 years.   COLONOSCOPY WITH PROPOFOL N/A 11/25/2019   Procedure: COLONOSCOPY WITH PROPOFOL;  Surgeon: Kelli Harman, DO;  Location: [facility];  Service: Endoscopy;  Laterality: N/A;  10:45am   OTHER SURGICAL HISTORY     heart surgery as infant to "repair hole in heart"   PORTACATH PLACEMENT Left  02/21/2019   Procedure: INSERTION PORT-A-CATH;  Surgeon: Kelli Cagey, MD;  Location: [facility] OR[facility];  Service: General;  Laterality: Left;    SOCIAL HISTORY:  Social History   Socioeconomic History   Marital status: Legally Separated    Spouse name: Not on file   Number of children: Not on file   Years of education: Not on file   Highest education level: Not on file  Occupational History   Not on file  Tobacco Use   Smoking status: Never   Smokeless tobacco: Never  Vaping Use   Vaping Use: Never used  Substance and Sexual Activity   Alcohol use: No   Drug use: No   Sexual activity: Not on file    Comment: divorced- 2 daughters  Other Topics Concern   Not on file  Social History Narrative   Not on file   Social Determinants of Health   Financial Resource Strain: Low Risk    Difficulty of Paying Living Expenses: Not hard at all  Food Insecurity: No Food Insecurity   Worried  About Running Out of Food in Kelli Last Year: Never true   Ran Out of Food in Kelli Last Year: Never true  Transportation Needs: No Transportation Needs   Lack of Transportation (Medical): No   Lack of Transportation (Non-Medical): No  Physical Activity: Inactive   Days of Exercise per Week: 0 days   Minutes of Exercise per Session: 0 min  Stress: No Stress Concern Present   Feeling of Stress : Not at all  Social Connections: Unknown   Frequency of Communication with Friends and Family: More than three times a week   Frequency of Social Gatherings with Friends and Family: Once a week   Attends Religious Services: More than 4 times per year   Active Member of [employer] or Organizations: No   Attends Music therapist: Never   Marital Status: Not on file  Intimate Partner Violence: Not At Risk   Fear of Current or Ex-Partner: No   Emotionally Abused: No   Physically Abused: No   Sexually Abused: No    FAMILY HISTORY:  Family History  Problem Relation Age of Onset   Hurley Mother     Hypertension Mother    Hurley Father    Hypertension Father    Hurley Maternal Grandmother    Hypertension Sister    Hypertension Brother    Prostate Hurley Brother    Colon Hurley Neg Hx    Colon polyps Neg Hx     CURRENT MEDICATIONS:  Current Outpatient Medications  Medication Sig Dispense Refill   acyclovir (ZOVIRAX) 400 MG tablet TAKE 1 TABLET BY MOUTH TWICE DAILY 60 tablet 4   ALPRAZolam (XANAX) 1 MG tablet Take 1 mg by mouth daily as needed.     Ascorbic Acid (VITAMIN C) 1000 MG tablet Take 1,000 mg by mouth daily.      aspirin EC 81 MG tablet Take 81 mg by mouth daily.     calcium carbonate (OS-CAL) 1250 (500 Ca) MG chewable tablet Chew 1 tablet by mouth daily.      cholecalciferol (VITAMIN D) 1000 units tablet Take 1,000 Units by mouth daily.     dexamethasone (DECADRON) 4 MG tablet Take 5 tablets once a week 30 tablet 3   Elastic Bandages & Supports (MEDICAL COMPRESSION STOCKINGS) MISC See admin instructions.     Elastic Bandages & Supports (WRIST SPLINT) MISC See admin instructions.     furosemide (LASIX) 20 MG tablet 1 tablet     gabapentin (NEURONTIN) 300 MG capsule Take 1 capsule (300 mg total) by mouth 2 (two) times daily. 60 capsule 6   lidocaine-prilocaine (EMLA) cream Apply a quarter sized glob 1 hour prior to treatment. 30 g 5   losartan (COZAAR) 25 MG tablet Take 12.5 mg by mouth daily.     magnesium oxide (MAG-OX) 400 (240 Mg) MG tablet Take 1 tablet by mouth 2 (two) times daily.     metolazone (ZAROXOLYN) 2.5 MG tablet TAKE 1 TABLET BY MOUTH ONCE DAILY. 30 tablet 0   Multiple Vitamin (TAB-A-VITE) TABS TAKE 1 TABLET BY MOUTH ONCE DAILY. (Patient taking differently: Take 1 tablet by mouth daily.) 30 tablet 11   pomalidomide (POMALYST) 4 MG capsule Take 1 capsule (4 mg total) by mouth daily. 21 capsule 0   potassium chloride SA (KLOR-CON) 20 MEQ tablet TAKE (1) TABLET BY MOUTH THREE TIMES DAILY 90 tablet 3   acetaminophen (TYLENOL) 650 MG CR tablet Take 650 mg by  mouth every 8 (eight) hours as needed for pain.  (  Patient not taking: Reported on 08/25/2020)     albuterol (VENTOLIN HFA) 108 (90 Base) MCG/ACT inhaler Inhale 2 puffs into Kelli lungs every 6 (six) hours as needed for wheezing or shortness of breath. (Patient not taking: Reported on 08/25/2020) 8 g 3   oxyCODONE-acetaminophen (PERCOCET/ROXICET) 5-325 MG tablet Take 1-2 tablets by mouth every 12 (twelve) hours as needed for severe pain. (Patient not taking: Reported on 08/25/2020) 60 tablet 0   trolamine salicylate (ASPERCREME) 10 % cream Apply 1 application topically 2 (two) times daily as needed for muscle pain.  (Patient not taking: Reported on 08/25/2020)     No current facility-administered medications for this visit.   Facility-Administered Medications Ordered in Other Visits  Medication Dose Route Frequency Provider Last Rate Last Admin   heparin lock flush 100 unit/mL  500 Units Intravenous Once Penland, Kelby Fam, MD       sodium chloride 0.9 % injection 10 mL  10 mL Intravenous Once Penland, Kelby Fam, MD        ALLERGIES:  Allergies  Allergen Reactions   Diclofenac Swelling    Per pt, facial swelling   Performance status (ECOG): 1 - Symptomatic but completely ambulatory  Vitals:   08/25/20 1024  BP: 123/60  Pulse: 71  Resp: 18  Temp: 98.1 F (36.7 C)  SpO2: 96%   Wt Readings from Last 3 Encounters:  08/25/20 (!) 349 lb (158.3 kg)  07/28/20 (!) 348 lb 8 oz (158.1 kg)  07/26/20 (!) 348 lb (157.9 kg)   LABORATORY DATA:  I have reviewed Kelli labs as listed.  CBC Latest Ref Rng & Units 08/18/2020 07/28/2020 07/06/2020  WBC 4.0 - 10.5 K/uL 5.8 6.8 4.7  Hemoglobin 12.0 - 15.0 g/dL 10.5(L) 10.9(L) 10.6(L)  Hematocrit 36.0 - 46.0 % 32.3(L) 34.2(L) 33.5(L)  Platelets 150 - 400 K/uL 260 268 187   CMP Latest Ref Rng & Units 08/18/2020 07/28/2020 07/06/2020  Glucose 70 - 99 mg/dL 120(H) 141(H) 122(H)  BUN 8 - 23 mg/dL 35(H) 25(H) 23  Creatinine 0.44 - 1.00 mg/dL 1.21(H) 1.30(H) 1.29(H)   Sodium 135 - 145 mmol/L 135 136 136  Potassium 3.5 - 5.1 mmol/L 3.7 4.2 3.9  Chloride 98 - 111 mmol/L 99 99 101  CO2 22 - 32 mmol/L _0 Calcium 8.9 - 10.3 mg/dL 9.4 9.5 8.8(L)  Total Protein 6.5 - 8.1 g/dL 6.9 7.2 6.9  Total Bilirubin 0.3 - 1.2 mg/dL 0.6 0.4 0.7  Alkaline Phos 38 - 126 U/L 47 54 52  AST 15 - 41 U/L 13(L) 15 14(L)  ALT 0 - 44 U/L _1 DIAGNOSTIC IMAGING:  I have independently reviewed Kelli scans and discussed with Kelli patient. No results found.   ASSESSMENT:  1.  Relapsed IgG kappa multiple myeloma with high risk features: -5 cycles of carfilzomib, pomalidomide and dexamethasone from 03/05/2019 through 06/25/2019. -Myeloma panel on 07/09/2019 shows M spike 0.3 g, slightly up from 0.2 g previously.  However free light chain ratio has 1.37 and improved.  Kappa light chains are 24.8. - Carfilzomib discontinued after 08/06/2019 secondary to fluid retention. - She is currently on Pomalyst 4 mg 3 weeks on 1 week off. - PET scan on 03/22/2020 with no evidence of malignancy. - Bone marrow biopsy on 03/19/2020 showed approximately 5% plasma cells.  Chromosome analysis and FISH panel were normal.   2.  Shortness of breath on exertion: -2D echo on 02/09/2019 shows EF 60-65%.  No LVH. -Chest CT  PE protocol on 02/24/2019 was negative. -Cardiac MRI on 03/07/2019 shows LVEF 56% with no amyloid.  Troponin T and proBNP were negative. -PFTs show low expiratory reserve volume consistent with body habitus.  Moderate diffusion defect which corrects to normal.  FVC, FEV1, FEV1/FVC ratio are within normal limits.  FVC is reduced relative to SVC indicates air-trapping.  No significant response after bronchodilators.  Reduced diffusion capacity indicates moderate loss of functional alveolar capillary space. -She was evaluated by Dr. Melvyn Novas. -2D echocardiogram on 08/27/2019 shows LVEF 60 to 65%.  No LVH.   3.  Myeloma bone disease: -Bone density on 07/04/2019 shows T score 1.8.   PLAN:  1.   Relapsed IgG kappa multiple myeloma with high risk features: - We reviewed myeloma panel from 08/18/2020.  M spike improved to 0.2 g.  M spike was 0.2 g on 07/06/2020 and 0.4 g on 05/24/2020.  Kappa light chains improved to 20.8.  Free light chain ratio improved to 1.58 from 2.21 prior to start of dexamethasone. - Overall her M spike and light chain ratio improved since we added dexamethasone 20 mg weekly to Pomalyst. - Weight gain can be a problem with dexamethasone.  This could exacerbate her shortness of breath on exertion.  I plan to repeat her myeloma panel in 2 months.  If they continue to improve, will consider cutting back on dexamethasone to 10 mg weekly. - She will continue Pomalyst 4 mg 3 weeks on 1 week off regimen. - RTC 2 months for follow-up.   2.  Shortness of breath on exertion: - She will continue bronchodilators and diuretics. - She was recently seen by Dr. Melvyn Novas 4 weeks ago.  She reports no improvement in shortness of breath on exertion.  I have told her to reach out to Dr. Gustavus Bryant office so that he can make a referral to [facility].   3.  ID prophylaxis: - Continue acyclovir twice daily and aspirin 81 mg daily for thromboprophylaxis.   4.  Neuropathy: - Continue gabapentin 300 mg 2-3 times daily.   5.  Lower extremity edema: - Continue metolazone on Monday, Wednesday and Friday.  She is not taking Bumex.   6.  Myeloma bone disease: - Zometa was held after last infusion on 01/27/2020 due to worsening renal function. - She has already received her 2-year worth of Zometa on and off.  Her renal function has improved to 1.21.  I am reluctant to start her back on Zometa at this time.   7.  Hypokalemia: - Continue potassium 40 mEq in Kelli morning and 20 mEq in Kelli afternoon.  Potassium today is 3.7.   8.  Bilateral knee pains: - Continue Percocet twice daily.  This is well controlled.  We have given refill.   Orders placed this encounter:  No orders of Kelli defined types were  placed in this encounter.  I provided 20 minutes of non-face-to-face time during this encounter.  Derek Jack, MD [facility] [phone]   I, Thana Ates, am acting as a scribe for Dr. Derek Jack.  I, Derek Jack MD, have reviewed Kelli above documentation for accuracy and completeness, and I agree with Kelli above.

## 2020-08-25 ENCOUNTER — Other Ambulatory Visit (HOSPITAL_COMMUNITY): Payer: Medicare Other

## 2020-08-25 ENCOUNTER — Inpatient Hospital Stay (HOSPITAL_COMMUNITY): Payer: Medicare Other

## 2020-08-25 ENCOUNTER — Other Ambulatory Visit (HOSPITAL_COMMUNITY): Payer: Self-pay | Admitting: *Deleted

## 2020-08-25 ENCOUNTER — Other Ambulatory Visit: Payer: Self-pay

## 2020-08-25 ENCOUNTER — Inpatient Hospital Stay (HOSPITAL_BASED_OUTPATIENT_CLINIC_OR_DEPARTMENT_OTHER): Payer: Medicare Other | Admitting: Hematology

## 2020-08-25 VITALS — BP 123/60 | HR 71 | Temp 98.1°F | Resp 18 | Wt 349.0 lb

## 2020-08-25 DIAGNOSIS — C9000 Multiple myeloma not having achieved remission: Secondary | ICD-10-CM | POA: Diagnosis not present

## 2020-08-25 DIAGNOSIS — G8929 Other chronic pain: Secondary | ICD-10-CM

## 2020-08-25 MED ORDER — OXYCODONE-ACETAMINOPHEN 5-325 MG PO TABS: 1.0000 | ORAL_TABLET | Freq: Two times a day (BID) | ORAL | 0 refills | Status: DC | PRN

## 2020-08-25 NOTE — Progress Notes (Deleted)
Patient has been assessed, vital signs and labs have been reviewed by Dr. Katragadda. ANC, Creatinine, LFTs, and Platelets are within treatment parameters per Dr. Katragadda. The patient is good to proceed with treatment at this time. Primary RN and pharmacy aware.  

## 2020-08-25 NOTE — Patient Instructions (Addendum)
[facility] at [facility] Discharge Instructions  You were seen today by Dr. Katragadda. He went over your recent results. Dr. Katragadda will see you back in 2 months for labs and follow up.   Thank you for choosing [facility] at [facility] to provide your oncology and hematology care.  To afford each patient quality time with our provider, please arrive at least 15 minutes before your scheduled appointment time.   If you have a lab appointment with the Cancer Center please come in thru the Main Entrance and check in at the main information desk  You need to re-schedule your appointment should you arrive 10 or more minutes late.  We strive to give you quality time with our providers, and arriving late affects you and other patients whose appointments are after yours.  Also, if you no show three or more times for appointments you may be dismissed from the clinic at the providers discretion.     Again, thank you for choosing [facility].  Our hope is that these requests will decrease the amount of time that you wait before being seen by our physicians.       _____________________________________________________________  Should you have questions after your visit to [facility], please contact our office at [phone] between the hours of 8:00 a.m. and 4:30 p.m.  Voicemails left after 4:00 p.m. will not be returned until the following business day.  For prescription refill requests, have your pharmacy contact our office and allow 72 hours.    Cancer Center Support Programs:   > Cancer Support Group  2nd Tuesday of the month 1pm-2pm, Journey Room   

## 2020-09-02 ENCOUNTER — Other Ambulatory Visit (HOSPITAL_COMMUNITY): Payer: Self-pay

## 2020-09-02 MED ORDER — POMALIDOMIDE 4 MG PO CAPS: 4.0000 mg | ORAL_CAPSULE | Freq: Every day | ORAL | 0 refills | Status: DC

## 2020-09-02 NOTE — Telephone Encounter (Signed)
Chart reviewed. Pomalyst refilled per Dr. Tomie China last office note.

## 2020-09-13 ENCOUNTER — Other Ambulatory Visit (HOSPITAL_COMMUNITY): Payer: Self-pay | Admitting: Hematology

## 2020-09-14 ENCOUNTER — Encounter (HOSPITAL_COMMUNITY): Payer: Self-pay | Admitting: Hematology

## 2020-09-24 ENCOUNTER — Other Ambulatory Visit (HOSPITAL_COMMUNITY): Payer: Self-pay

## 2020-09-24 MED ORDER — POMALIDOMIDE 4 MG PO CAPS: 4.0000 mg | ORAL_CAPSULE | Freq: Every day | ORAL | 0 refills | Status: DC

## 2020-09-24 NOTE — Telephone Encounter (Signed)
Chart reviewed. Pomalyst refilled per Dr. Delton Coombes last office note.

## 2020-10-06 ENCOUNTER — Other Ambulatory Visit (HOSPITAL_COMMUNITY): Payer: Self-pay | Admitting: Hematology

## 2020-10-06 ENCOUNTER — Other Ambulatory Visit (HOSPITAL_COMMUNITY): Payer: Self-pay

## 2020-10-06 DIAGNOSIS — G8929 Other chronic pain: Secondary | ICD-10-CM

## 2020-10-06 DIAGNOSIS — M25562 Pain in left knee: Secondary | ICD-10-CM

## 2020-10-06 MED ORDER — OXYCODONE-ACETAMINOPHEN 5-325 MG PO TABS: 1.0000 | ORAL_TABLET | Freq: Two times a day (BID) | ORAL | 0 refills | Status: DC | PRN

## 2020-10-12 ENCOUNTER — Other Ambulatory Visit (HOSPITAL_COMMUNITY): Payer: Self-pay | Admitting: Nephrology

## 2020-10-12 DIAGNOSIS — I10 Essential (primary) hypertension: Secondary | ICD-10-CM

## 2020-10-17 ENCOUNTER — Other Ambulatory Visit (HOSPITAL_COMMUNITY): Payer: Self-pay | Admitting: Hematology

## 2020-10-18 ENCOUNTER — Encounter (HOSPITAL_COMMUNITY): Payer: Self-pay | Admitting: Hematology

## 2020-10-20 ENCOUNTER — Other Ambulatory Visit (HOSPITAL_COMMUNITY): Payer: Self-pay

## 2020-10-20 MED ORDER — POMALIDOMIDE 4 MG PO CAPS: 4.0000 mg | ORAL_CAPSULE | Freq: Every day | ORAL | 0 refills | Status: DC

## 2020-10-20 NOTE — Telephone Encounter (Signed)
Chart reviewed. Pomalyst refilled per last office note with Dr. Katragadda.  

## 2020-10-26 ENCOUNTER — Other Ambulatory Visit (HOSPITAL_COMMUNITY): Payer: Self-pay | Admitting: Hematology

## 2020-10-27 ENCOUNTER — Other Ambulatory Visit: Payer: Self-pay

## 2020-10-27 ENCOUNTER — Inpatient Hospital Stay (HOSPITAL_COMMUNITY): Payer: Medicare Other | Attending: Hematology

## 2020-10-27 ENCOUNTER — Other Ambulatory Visit (HOSPITAL_COMMUNITY): Payer: Self-pay | Admitting: *Deleted

## 2020-10-27 DIAGNOSIS — G8929 Other chronic pain: Secondary | ICD-10-CM

## 2020-10-27 DIAGNOSIS — C9000 Multiple myeloma not having achieved remission: Secondary | ICD-10-CM | POA: Insufficient documentation

## 2020-10-27 DIAGNOSIS — M25562 Pain in left knee: Secondary | ICD-10-CM

## 2020-10-27 LAB — COMPREHENSIVE METABOLIC PANEL
ALT: 14 U/L (ref 0–44)
AST: 14 U/L — ABNORMAL LOW (ref 15–41)
Albumin: 3.6 g/dL (ref 3.5–5.0)
Alkaline Phosphatase: 50 U/L (ref 38–126)
Anion gap: 10 (ref 5–15)
BUN: 31 mg/dL — ABNORMAL HIGH (ref 8–23)
CO2: 27 mmol/L (ref 22–32)
Calcium: 9.2 mg/dL (ref 8.9–10.3)
Chloride: 97 mmol/L — ABNORMAL LOW (ref 98–111)
Creatinine, Ser: 1.33 mg/dL — ABNORMAL HIGH (ref 0.44–1.00)
GFR, Estimated: 45 mL/min — ABNORMAL LOW (ref >60)
Glucose, Bld: 146 mg/dL — ABNORMAL HIGH (ref 70–99)
Potassium: 3.4 mmol/L — ABNORMAL LOW (ref 3.5–5.1)
Sodium: 134 mmol/L — ABNORMAL LOW (ref 135–145)
Total Bilirubin: 0.2 mg/dL — ABNORMAL LOW (ref 0.3–1.2)
Total Protein: 7 g/dL (ref 6.5–8.1)

## 2020-10-27 LAB — CBC WITH DIFFERENTIAL/PLATELET
Abs Immature Granulocytes: 0.09 10*3/uL — ABNORMAL HIGH (ref 0.00–0.07)
Basophils Absolute: 0 10*3/uL (ref 0.0–0.1)
Basophils Relative: 0 %
Eosinophils Absolute: 0 10*3/uL (ref 0.0–0.5)
Eosinophils Relative: 0 %
HCT: 33 % — ABNORMAL LOW (ref 36.0–46.0)
Hemoglobin: 10.5 g/dL — ABNORMAL LOW (ref 12.0–15.0)
Immature Granulocytes: 2 %
Lymphocytes Relative: 16 %
Lymphs Abs: 0.7 10*3/uL (ref 0.7–4.0)
MCH: 30.3 pg (ref 26.0–34.0)
MCHC: 31.8 g/dL (ref 30.0–36.0)
MCV: 95.1 fL (ref 80.0–100.0)
Monocytes Absolute: 0.6 10*3/uL (ref 0.1–1.0)
Monocytes Relative: 14 %
Neutro Abs: 2.8 10*3/uL (ref 1.7–7.7)
Neutrophils Relative %: 68 %
Platelets: 239 10*3/uL (ref 150–400)
RBC: 3.47 MIL/uL — ABNORMAL LOW (ref 3.87–5.11)
RDW: 14.2 % (ref 11.5–15.5)
WBC: 4.1 10*3/uL (ref 4.0–10.5)
nRBC: 0 % (ref 0.0–0.2)

## 2020-10-27 LAB — LACTATE DEHYDROGENASE: LDH: 144 U/L (ref 98–192)

## 2020-10-27 LAB — MAGNESIUM: Magnesium: 1.9 mg/dL (ref 1.7–2.4)

## 2020-10-27 MED ORDER — SODIUM CHLORIDE 0.9% FLUSH
10.0000 mL | Freq: Once | INTRAVENOUS | Status: AC
  Administered 2020-10-27: 10 mL via INTRAVENOUS

## 2020-10-27 MED ORDER — HEPARIN SOD (PORK) LOCK FLUSH 100 UNIT/ML IV SOLN
500.0000 [IU] | Freq: Once | INTRAVENOUS | Status: AC
  Administered 2020-10-27: 500 [IU] via INTRAVENOUS

## 2020-10-27 MED ORDER — OXYCODONE-ACETAMINOPHEN 5-325 MG PO TABS: 1.0000 | ORAL_TABLET | Freq: Two times a day (BID) | ORAL | 0 refills | Status: DC | PRN

## 2020-10-27 NOTE — Patient Instructions (Signed)
[facility]  Discharge Instructions: Thank you for choosing [facility] to provide your oncology and hematology care.  If you have a lab appointment with the [facility], please come in thru the Main Entrance and check in at the main information desk.  Wear comfortable clothing and clothing appropriate for easy access to any Portacath or PICC line.   We strive to give you quality time with your provider. You may need to reschedule your appointment if you arrive late (15 or more minutes).  Arriving late affects you and other patients whose appointments are after yours.  Also, if you miss three or more appointments without notifying the office, you may be dismissed from the clinic at the provider's discretion.      For prescription refill requests, have your pharmacy contact our office and allow 72 hours for refills to be completed.    Today you received the following chemotherapy and/or immunotherapy agents PORT flush and labs      To help prevent nausea and vomiting after your treatment, we encourage you to take your nausea medication as directed.  BELOW ARE SYMPTOMS THAT SHOULD BE REPORTED IMMEDIATELY: *FEVER GREATER THAN 100.4 F (38 C) OR HIGHER *CHILLS OR SWEATING *NAUSEA AND VOMITING THAT IS NOT CONTROLLED WITH YOUR NAUSEA MEDICATION *UNUSUAL SHORTNESS OF BREATH *UNUSUAL BRUISING OR BLEEDING *URINARY PROBLEMS (pain or burning when urinating, or frequent urination) *BOWEL PROBLEMS (unusual diarrhea, constipation, pain near the anus) TENDERNESS IN MOUTH AND THROAT WITH OR WITHOUT PRESENCE OF ULCERS (sore throat, sores in mouth, or a toothache) UNUSUAL RASH, SWELLING OR PAIN  UNUSUAL VAGINAL DISCHARGE OR ITCHING   Items with * indicate a potential emergency and should be followed up as soon as possible or go to the Emergency Department if any problems should occur.  Please show the CHEMOTHERAPY ALERT CARD or IMMUNOTHERAPY ALERT CARD at check-in to the  Emergency Department and triage nurse.  Should you have questions after your visit or need to cancel or reschedule your appointment, please contact [facility] [phone]  and follow the prompts.  Office hours are 8:00 a.m. to 4:30 p.m. Monday - Friday. Please note that voicemails left after 4:00 p.m. may not be returned until the following business day.  We are closed weekends and major holidays. You have access to a nurse at all times for urgent questions. Please call the main number to the clinic [phone] and follow the prompts.  For any non-urgent questions, you may also contact your provider using MyChart. We now offer e-Visits for anyone 64 and older to request care online for non-urgent symptoms. For details visit [email or web address].   Also download the MyChart app! Go to the app store, search "MyChart", open the app, select [facility], and log in with your MyChart username and password.  Due to Covid, a mask is required upon entering the hospital/clinic. If you do not have a mask, one will be given to you upon arrival. For doctor visits, patients may have 1 support person aged 44 or older with them. For treatment visits, patients cannot have anyone with them due to current Covid guidelines and our immunocompromised population.

## 2020-10-27 NOTE — Progress Notes (Signed)
Patients port flushed without difficulty.  Good blood return noted with no bruising or swelling noted at site.  Stable during access and blood draw.  Band aid applied.  VSS with discharge and left in satisfactory condition with no s/s of distress noted.  Discharge from clinic via wheelchair in stable condition.  Alert and oriented X 3.  Follow up with [facility] as scheduled.

## 2020-10-28 LAB — KAPPA/LAMBDA LIGHT CHAINS
Kappa free light chain: 28.5 mg/L — ABNORMAL HIGH (ref 3.3–19.4)
Kappa, lambda light chain ratio: 1.85 — ABNORMAL HIGH (ref 0.26–1.65)
Lambda free light chains: 15.4 mg/L (ref 5.7–26.3)

## 2020-10-28 LAB — PROTEIN ELECTROPHORESIS, SERUM
A/G Ratio: 1.3 (ref 0.7–1.7)
Albumin ELP: 3.5 g/dL (ref 2.9–4.4)
Alpha-1-Globulin: 0.2 g/dL (ref 0.0–0.4)
Alpha-2-Globulin: 0.7 g/dL (ref 0.4–1.0)
Beta Globulin: 1 g/dL (ref 0.7–1.3)
Gamma Globulin: 0.8 g/dL (ref 0.4–1.8)
Globulin, Total: 2.7 g/dL (ref 2.2–3.9)
M-Spike, %: 0.3 g/dL — ABNORMAL HIGH
Total Protein ELP: 6.2 g/dL (ref 6.0–8.5)

## 2020-10-29 ENCOUNTER — Ambulatory Visit (HOSPITAL_COMMUNITY)
Admission: RE | Admit: 2020-10-29 | Discharge: 2020-10-29 | Disposition: A | Discharge status: Home / Self Care | Payer: Medicare Other | Source: Ambulatory Visit | Attending: Nephrology | Admitting: Nephrology

## 2020-10-29 ENCOUNTER — Other Ambulatory Visit: Payer: Self-pay

## 2020-10-29 DIAGNOSIS — I10 Essential (primary) hypertension: Secondary | ICD-10-CM | POA: Diagnosis not present

## 2020-10-29 LAB — ECHOCARDIOGRAM COMPLETE
AR max vel: 1.85 cm2
AV Area VTI: 1.95 cm2
AV Area mean vel: 1.84 cm2
AV Mean grad: 7 mmHg
AV Peak grad: 14.1 mmHg
Ao pk vel: 1.88 m/s
Area-P 1/2: 3.74 cm2
Calc EF: 62.6 %
MV VTI: 2.38 cm2
S' Lateral: 3.1 cm
Single Plane A2C EF: 67.8 %
Single Plane A4C EF: 54.5 %

## 2020-10-29 LAB — IMMUNOFIXATION ELECTROPHORESIS
IgA: 215 mg/dL (ref 87–352)
IgG (Immunoglobin G), Serum: 803 mg/dL (ref 586–1602)
IgM (Immunoglobulin M), Srm: 60 mg/dL (ref 26–217)
Total Protein ELP: 6.2 g/dL (ref 6.0–8.5)

## 2020-10-29 NOTE — Progress Notes (Signed)
*  PRELIMINARY RESULTS* Echocardiogram 2D Echocardiogram has been performed.  Kelli Hurley 10/29/2020, 12:32 PM

## 2020-11-01 ENCOUNTER — Other Ambulatory Visit (HOSPITAL_COMMUNITY): Payer: Self-pay | Admitting: Hematology

## 2020-11-01 DIAGNOSIS — C9000 Multiple myeloma not having achieved remission: Secondary | ICD-10-CM

## 2020-11-03 ENCOUNTER — Ambulatory Visit (HOSPITAL_COMMUNITY): Payer: Medicare Other | Admitting: Hematology

## 2020-11-10 NOTE — Progress Notes (Signed)
[place] [address], [place] [address]   CLINIC:  Medical Oncology/Hematology  PCP:  Alfonse Flavors, MD [address] [place] / [place] [address] [phone]   REASON FOR VISIT:  Follow-up for multiple myeloma  PRIOR THERAPY:  1. Bortezomib x 8 cycles from 10/05/2014 to 04/06/2016. 2. Carfilzomib x 6 cycles from 03/05/2019 to 08/06/2019.  NGS Results: not done  CURRENT THERAPY: Pomalyst 4 mg 3/4 weeks  BRIEF ONCOLOGIC HISTORY:  Oncology History  Multiple myeloma ([place])  08/07/2014 Imaging   Bone Survey- No lytic lesions are noted in the visualized skeleton.   09/03/2014 Bone Marrow Biopsy   NORMOCELLULAR BONE MARROW FOR AGE WITH PLASMA CELL NEOPLASM.  The plasma cell component is increased in the marrow representing an estimated 18% of all cells. Cytogenetics with 13q-, 17p- (high risk disease)   09/03/2014 Pathology Results   Cytogenetics with 13q-, 17p- (high risk disease)   09/23/2014 Initial Diagnosis   Multiple myeloma   09/30/2014 PET scan   No abnormal hypermetabolism in the neck, chest, abdomen or pelvis.   10/05/2014 - 03/22/2015 Chemotherapy   RVD   10/28/2014 Treatment Plan Change   Issues related to getting Revlimid in a timely fashion, therefore, she received her Revlimid on 9/21 resulting in a 12 day cycle this time instead of a 14 day cycle   11/02/2014 Imaging   CTA chest- No evidence for a large or central pulmonary embolism as described.  8 mm density along the right minor fissure could represent focal pleural thickening but indeterminate. If the patient is at high risk for bronchogenic carcinoma, follow-up c   11/23/2014 Miscellaneous   Zometa 4 mg IV monthly   04/07/2015 Bone Marrow Biopsy   Normocellular marrow with 2-4% clonal plasma cells by immunohistochemistry. FISH and cytogenetics were normal [facility])     04/2015 Miscellaneous   PRETRANSPLANT EVALUATION:  Pulmonary function tests: FEV1 100.3% / DLCO 97.9%   Echocardiogram: Normal LV function with EF 60-65%    04/27/2015 Procedure   Stem cell mobilization with filgrastim and Mozobil [facility])   05/06/2015 Miscellaneous   BMT conditioning regimen with high-dose Melphalan given [facility], [name]); Day -1   05/07/2015 Bone Marrow Transplant   Outpatient autologous stem cell transplant [facility], [name]); Day 0   05/18/2015 Miscellaneous   WBC engraftment;  did not require platelet transfusion during her transplant process. Lowest platelet count 28,000    05/20/2015 Procedure   Tunneled catheter removed [facility])   09/08/2015 -  Chemotherapy   Velcade every 2 weeks   12/15/2015 Miscellaneous   Zometa re-instituted.    12/23/2015 Imaging   Bone density- BMD as determined from [facility] 33% is 0.799 g/cm2 with a T-Score of 1.2. This patient is considered normal according to [facility]) criteria.   04/17/2016 Miscellaneous   Started maintenance with N[place].   05/04/2016 Treatment Plan Change   Zometa switched to Xgeva injection monthly given difficult IV access.    03/05/2019 - 08/06/2019 Chemotherapy   The patient had carfilzomib (KYPROLIS) 40 mg in dextrose 5 % 50 mL chemo infusion, 18 mg/m2 = 44 mg, Intravenous,  Once, 6 of 6 cycles Dose modification: 56 mg/m2 (original dose 56 mg/m2, Cycle 1, Reason: Provider Judgment) Administration: 40 mg (03/05/2019), 120 mg (03/12/2019), 120 mg (03/20/2019), 120 mg (04/02/2019), 120 mg (04/09/2019), 120 mg (04/16/2019), 120 mg (04/30/2019), 120 mg (05/08/2019), 120 mg (05/14/2019), 120 mg (05/28/2019), 120 mg (06/04/2019), 120 mg (06/11/2019), 120 mg (  06/25/2019), 120 mg (07/02/2019), 120 mg (07/09/2019), 120 mg (07/23/2019), 120 mg (07/30/2019), 120 mg (08/06/2019)   for chemotherapy treatment.       CANCER STAGING: Cancer Staging Multiple myeloma [facility]) Staging form: Multiple Myeloma, AJCC 6th Edition - Clinical stage from 10/26/2014: Stage IIA - Signed by Baird Cancer, PA-C on  10/26/2014 - Pathologic: No stage assigned - Unsigned   INTERVAL HISTORY:  [name], a 65 y.o. female, returns for routine follow-up of her multiple myeloma. Camille was last seen on 08/25/2020.   Today she reports feeling good. She denies any new pains, and her SOB has improved. Her tingling/numbness is stable.   REVIEW OF SYSTEMS:  Review of Systems  Constitutional:  Negative for appetite change and fatigue.  Respiratory:  Positive for shortness of breath (improved).   Musculoskeletal:  Positive for arthralgias (4/10 knee).  Neurological:  Positive for numbness (stable).  All other systems reviewed and are negative.  PAST MEDICAL/SURGICAL HISTORY:  Past Medical History:  Diagnosis Date   Anemia    Arthritis    Cancer ([place])    Claustrophobia 10/05/2014   Hypertension    Leukopenia 08/03/2014   Normocytic hypochromic anemia 08/03/2014   Renal disorder    stage 3    Past Surgical History:  Procedure Laterality Date   ABDOMINAL HYSTERECTOMY     CARDIAC SURGERY     90 months old. States she had a leaky valve.    COLONOSCOPY  03/26/2009   Dr. Oneida Alar; normal colon, small internal hemorrhoids.  Recommended repeat colonoscopy in 10 years.   COLONOSCOPY WITH PROPOFOL N/A 11/25/2019   Procedure: COLONOSCOPY WITH PROPOFOL;  Surgeon: Eloise Harman, DO;  Location: [facility];  Service: Endoscopy;  Laterality: N/A;  10:45am   OTHER SURGICAL HISTORY     heart surgery as infant to "repair hole in heart"   PORTACATH PLACEMENT Left 02/21/2019   Procedure: INSERTION PORT-A-CATH;  Surgeon: Virl Cagey, MD;  Location: [facility] ORS;  Service: General;  Laterality: Left;    SOCIAL HISTORY:  Social History   Socioeconomic History   Marital status: Legally Separated    Spouse name: Not on file   Number of children: Not on file   Years of education: Not on file   Highest education level: Not on file  Occupational History   Not on file  Tobacco Use   Smoking status:  Never   Smokeless tobacco: Never  Vaping Use   Vaping Use: Never used  Substance and Sexual Activity   Alcohol use: No   Drug use: No   Sexual activity: Not on file    Comment: divorced- 2 daughters  Other Topics Concern   Not on file  Social History Narrative   Not on file   Social Determinants of Health   Financial Resource Strain: Low Risk    Difficulty of Paying Living Expenses: Not hard at all  Food Insecurity: No Food Insecurity   Worried About Charity fundraiser in the Last Year: Never true   Ran Out of Food in the Last Year: Never true  Transportation Needs: No Transportation Needs   Lack of Transportation (Medical): No   Lack of Transportation (Non-Medical): No  Physical Activity: Inactive   Days of Exercise per Week: 0 days   Minutes of Exercise per Session: 0 min  Stress: No Stress Concern Present   Feeling of Stress : Not at all  Social Connections: Unknown   Frequency of Communication with Friends  and Family: More than three times a week   Frequency of Social Gatherings with Friends and Family: Once a week   Attends Religious Services: More than 4 times per year   Active Member of [employer] or Organizations: No   Attends Music therapist: Never   Marital Status: Not on file  Intimate Partner Violence: Not At Risk   Fear of Current or Ex-Partner: No   Emotionally Abused: No   Physically Abused: No   Sexually Abused: No    FAMILY HISTORY:  Family History  Problem Relation Age of Onset   Cancer Mother    Hypertension Mother    Cancer Father    Hypertension Father    Cancer Maternal Grandmother    Hypertension Sister    Hypertension Brother    Prostate cancer Brother    Colon cancer Neg Hx    Colon polyps Neg Hx     CURRENT MEDICATIONS:  Current Outpatient Medications  Medication Sig Dispense Refill   acetaminophen (TYLENOL) 650 MG CR tablet Take 650 mg by mouth every 8 (eight) hours as needed for pain.     acyclovir (ZOVIRAX) 400 MG  tablet TAKE 1 TABLET BY MOUTH TWICE DAILY 60 tablet 6   albuterol (VENTOLIN HFA) 108 (90 Base) MCG/ACT inhaler Inhale 2 puffs into the lungs every 6 (six) hours as needed for wheezing or shortness of breath. 8 g 3   ALPRAZolam (XANAX) 1 MG tablet Take 1 mg by mouth daily as needed.     Ascorbic Acid (VITAMIN C) 1000 MG tablet Take 1,000 mg by mouth daily.      aspirin EC 81 MG tablet Take 81 mg by mouth daily.     calcium carbonate (OS-CAL) 1250 (500 Ca) MG chewable tablet Chew 1 tablet by mouth daily.      cholecalciferol (VITAMIN D) 1000 units tablet Take 1,000 Units by mouth daily.     dexamethasone (DECADRON) 4 MG tablet Take 5 tablets once a week 30 tablet 6   Elastic Bandages & Supports (MEDICAL COMPRESSION STOCKINGS) MISC See admin instructions.     Elastic Bandages & Supports (WRIST SPLINT) MISC See admin instructions.     furosemide (LASIX) 20 MG tablet 1 tablet     gabapentin (NEURONTIN) 300 MG capsule TAKE (1) CAPSULE BY MOUTH TWICE DAILY. 60 capsule 5   lidocaine-prilocaine (EMLA) cream Apply a quarter sized glob 1 hour prior to treatment. 30 g 5   losartan (COZAAR) 25 MG tablet Take 12.5 mg by mouth daily.     magnesium oxide (MAG-OX) 400 (240 Mg) MG tablet TAKE 1 TABLET BY MOUTH TWICE DAILY 60 tablet 6   metolazone (ZAROXOLYN) 2.5 MG tablet TAKE 1 TABLET BY MOUTH ONCE DAILY. 30 tablet 6   Multiple Vitamin (TAB-A-VITE) TABS TAKE 1 TABLET BY MOUTH ONCE DAILY. (Patient taking differently: Take 1 tablet by mouth daily.) 30 tablet 11   oxyCODONE-acetaminophen (PERCOCET/ROXICET) 5-325 MG tablet Take 1-2 tablets by mouth every 12 (twelve) hours as needed for severe pain. 60 tablet 0   pomalidomide (POMALYST) 4 MG capsule Take 1 capsule (4 mg total) by mouth daily. 21 days on, 7 days off 21 capsule 0   potassium chloride SA (KLOR-CON) 20 MEQ tablet TAKE (1) TABLET BY MOUTH THREE TIMES DAILY 90 tablet 3   trolamine salicylate (ASPERCREME) 10 % cream Apply 1 application topically 2 (two)  times daily as needed for muscle pain.     No current facility-administered medications for this visit.  Facility-Administered Medications Ordered in Other Visits  Medication Dose Route Frequency Provider Last Rate Last Admin   heparin lock flush 100 unit/mL  500 Units Intravenous Once Penland, Kelby Fam, MD       sodium chloride 0.9 % injection 10 mL  10 mL Intravenous Once Penland, Kelby Fam, MD        ALLERGIES:  Allergies  Allergen Reactions   Diclofenac Swelling    Per pt, facial swelling    PHYSICAL EXAM:  Performance status (ECOG): 1 - Symptomatic but completely ambulatory  There were no vitals filed for this visit. Wt Readings from Last 3 Encounters:  08/25/20 (!) 349 lb (158.3 kg)  07/28/20 (!) 348 lb 8 oz (158.1 kg)  07/26/20 (!) 348 lb (157.9 kg)   Physical Exam Vitals reviewed.  Constitutional:      Appearance: Normal appearance.  Cardiovascular:     Rate and Rhythm: Normal rate and regular rhythm.     Pulses: Normal pulses.     Heart sounds: Normal heart sounds.  Pulmonary:     Effort: Pulmonary effort is normal.     Breath sounds: Normal breath sounds.  Musculoskeletal:     Right lower leg: 2+ Edema present.     Left lower leg: 2+ Edema present.  Neurological:     General: No focal deficit present.     Mental Status: She is alert and oriented to person, place, and time.  Psychiatric:        Mood and Affect: Mood normal.        Behavior: Behavior normal.     LABORATORY DATA:  I have reviewed the labs as listed.  CBC Latest Ref Rng & Units 10/27/2020 08/18/2020 07/28/2020  WBC 4.0 - 10.5 K/uL 4.1 5.8 6.8  Hemoglobin 12.0 - 15.0 g/dL 10.5(L) 10.5(L) 10.9(L)  Hematocrit 36.0 - 46.0 % 33.0(L) 32.3(L) 34.2(L)  Platelets 150 - 400 K/uL 239 260 268   CMP Latest Ref Rng & Units 10/27/2020 08/18/2020 07/28/2020  Glucose 70 - 99 mg/dL 146(H) 120(H) 141(H)  BUN 8 - 23 mg/dL 31(H) 35(H) 25(H)  Creatinine 0.44 - 1.00 mg/dL 1.33(H) 1.21(H) 1.30(H)  Sodium 135 -  145 mmol/L 134(L) 135 136  Potassium 3.5 - 5.1 mmol/L 3.4(L) 3.7 4.2  Chloride 98 - 111 mmol/L 97(L) 99 99  CO2 22 - 32 mmol/L 27 29 28   Calcium 8.9 - 10.3 mg/dL 9.2 9.4 9.5  Total Protein 6.5 - 8.1 g/dL 7.0 6.9 7.2  Total Bilirubin 0.3 - 1.2 mg/dL 0.2(L) 0.6 0.4  Alkaline Phos 38 - 126 U/L 50 47 54  AST 15 - 41 U/L 14(L) 13(L) 15  ALT 0 - 44 U/L 14 13 13     DIAGNOSTIC IMAGING:  I have independently reviewed the scans and discussed with the patient. ECHOCARDIOGRAM COMPLETE  Result Date: 10/29/2020    ECHOCARDIOGRAM REPORT   Patient Name:   Kelli Hurley Date of Exam: 10/29/2020 Medical Rec #:  [id number]          Height:       62.0 in Accession #:    [id number]         Weight:       349.0 lb Date of Birth:  1955/03/10         BSA:          2.422 m Patient Age:    65 years           BP:  105/70 mmHg Patient Gender: F                  HR:           76 bpm. Exam Location:  [facility] Procedure: 2D Echo, Cardiac Doppler and Color Doppler Indications:    Pulmonary HTN  History:        Patient has prior history of Echocardiogram examinations, most                 recent 08/27/2019. Signs/Symptoms:Dyspnea; Risk                 Factors:Hypertension. Morbid Obesity.  Sonographer:    Wenda Low Referring Phys: Nil[place]  1. Left ventricular ejection fraction, by estimation, is 60 to 65%. The left ventricle has normal function. The left ventricle has no regional wall motion abnormalities. There is mild left ventricular hypertrophy. Left ventricular diastolic parameters are indeterminate.  2. Right ventricular systolic function is normal. The right ventricular size is normal. Tricuspid regurgitation signal is inadequate for assessing PA pressure.  3. The mitral valve is normal in structure. No evidence of mitral valve regurgitation. No evidence of mitral stenosis.  4. The aortic valve has an indeterminant number of cusps. Aortic valve regurgitation is not visualized.  No aortic stenosis is present.  5. The inferior vena cava is normal in size with greater than 50% respiratory variability, suggesting right atrial pressure of 3 mmHg. FINDINGS  Left Ventricle: Left ventricular ejection fraction, by estimation, is 60 to 65%. The left ventricle has normal function. The left ventricle has no regional wall motion abnormalities. The left ventricular internal cavity size was normal in size. There is  mild left ventricular hypertrophy. Left ventricular diastolic parameters are indeterminate. Right Ventricle: The right ventricular size is normal. No increase in right ventricular wall thickness. Right ventricular systolic function is normal. Tricuspid regurgitation signal is inadequate for assessing PA pressure. Left Atrium: Left atrial size was normal in size. Right Atrium: Right atrial size was normal in size. Pericardium: There is no evidence of pericardial effusion. Mitral Valve: The mitral valve is normal in structure. No evidence of mitral valve regurgitation. No evidence of mitral valve stenosis. MV peak gradient, 7.2 mmHg. The mean mitral valve gradient is 2.0 mmHg. Tricuspid Valve: The tricuspid valve is normal in structure. Tricuspid valve regurgitation is not demonstrated. No evidence of tricuspid stenosis. Aortic Valve: The aortic valve has an indeterminant number of cusps. Aortic valve regurgitation is not visualized. No aortic stenosis is present. Aortic valve mean gradient measures 7.0 mmHg. Aortic valve peak gradient measures 14.1 mmHg. Aortic valve area, by VTI measures 1.95 cm. Pulmonic Valve: The pulmonic valve was not well visualized. Pulmonic valve regurgitation is not visualized. No evidence of pulmonic stenosis. Aorta: The aortic root is normal in size and structure. Venous: The inferior vena cava is normal in size with greater than 50% respiratory variability, suggesting right atrial pressure of 3 mmHg. IAS/Shunts: No atrial level shunt detected by color flow  Doppler.  LEFT VENTRICLE PLAX 2D LVIDd:         5.30 cm     Diastology LVIDs:         3.10 cm     LV e' medial:    8.49 cm/s LV PW:         1.20 cm     LV E/e' medial:  10.5 LV IVS:        1.30 cm  LV e' lateral:   7.51 cm/s LVOT diam:     2.00 cm     LV E/e' lateral: 11.9 LV SV:         77 LV SV Index:   32 LVOT Area:     3.14 cm  LV Volumes (MOD) LV vol d, MOD A2C: 78.9 ml LV vol d, MOD A4C: 69.9 ml LV vol s, MOD A2C: 25.4 ml LV vol s, MOD A4C: 31.8 ml LV SV MOD A2C:     53.5 ml LV SV MOD A4C:     69.9 ml LV SV MOD BP:      50.3 ml RIGHT VENTRICLE RV Basal diam:  3.30 cm RV Mid diam:    3.50 cm RV S prime:     12.40 cm/s TAPSE (M-mode): 2.6 cm LEFT ATRIUM             Index       RIGHT ATRIUM           Index LA diam:        3.10 cm 1.28 cm/m  RA Area:     21.00 cm LA Vol (A2C):   77.6 ml 32.04 ml/m RA Volume:   61.20 ml  25.27 ml/m LA Vol (A4C):   61.9 ml 25.56 ml/m LA Biplane Vol: 72.4 ml 29.89 ml/m  AORTIC VALVE                    PULMONIC VALVE AV Area (Vmax):    1.85 cm     PV Vmax:       1.00 m/s AV Area (Vmean):   1.84 cm     PV Peak grad:  4.0 mmHg AV Area (VTI):     1.95 cm AV Vmax:           188.00 cm/s AV Vmean:          122.000 cm/s AV VTI:            0.395 m AV Peak Grad:      14.1 mmHg AV Mean Grad:      7.0 mmHg LVOT Vmax:         111.00 cm/s LVOT Vmean:        71.500 cm/s LVOT VTI:          0.245 m LVOT/AV VTI ratio: 0.62  AORTA Ao Root diam: 2.90 cm MITRAL VALVE MV Area (PHT): 3.74 cm    SHUNTS MV Area VTI:   2.38 cm    Systemic VTI:  0.24 m MV Peak grad:  7.2 mmHg    Systemic Diam: 2.00 cm MV Mean grad:  2.0 mmHg MV Vmax:       1.34 m/s MV Vmean:      64.8 cm/s MV Decel Time: 203 msec MV E velocity: 89.10 cm/s MV A velocity: 85.30 cm/s MV E/A ratio:  1.04 Carlyle Dolly MD Electronically signed by Carlyle Dolly MD Signature Date/Time: 10/29/2020/2:00:08 PM    Final      ASSESSMENT:  1.  Relapsed IgG kappa multiple myeloma with high risk features: -5 cycles of carfilzomib,  pomalidomide and dexamethasone from 03/05/2019 through 06/25/2019. -Myeloma panel on 07/09/2019 shows M spike 0.3 g, slightly up from 0.2 g previously.  However free light chain ratio has 1.37 and improved.  Kappa light chains are 24.8. - Carfilzomib discontinued after 08/06/2019 secondary to fluid retention. - She is currently on Pomalyst 4 mg 3 weeks on 1 week off. - PET scan on  03/22/2020 with no evidence of malignancy. - Bone marrow biopsy on 03/19/2020 showed approximately 5% plasma cells.  Chromosome analysis and FISH panel were normal.   2.  Shortness of breath on exertion: -2D echo on 02/09/2019 shows EF 60-65%.  No LVH. -Chest CT PE protocol on 02/24/2019 was negative. -Cardiac MRI on 03/07/2019 shows LVEF 56% with no amyloid.  Troponin T and proBNP were negative. -PFTs show low expiratory reserve volume consistent with body habitus.  Moderate diffusion defect which corrects to normal.  FVC, FEV1, FEV1/FVC ratio are within normal limits.  FVC is reduced relative to SVC indicates air-trapping.  No significant response after bronchodilators.  Reduced diffusion capacity indicates moderate loss of functional alveolar capillary space. -She was evaluated by Dr. Melvyn Novas. -2D echocardiogram on 08/27/2019 shows LVEF 60 to 65%.  No LVH.   3.  Myeloma bone disease: -Bone density on 07/04/2019 shows T score 1.8.   PLAN:  1.  Relapsed IgG kappa multiple myeloma with high risk features: - We have reviewed myeloma panel from 10/27/2020 which showed M spike 0.3 g.  Prior to that M spike was 0.2 g in July.  Free light chain ratio was 1.85 and kappa light chains 28.5 which is slightly up.  Immunofixation shows IgG kappa. - CBC shows hemoglobin 10.5.  Calcium and LFTs were normal. - We will continue pomalidomide 4 mg 3 weeks on/1 week off.  She will continue dexamethasone 20 mg weekly. - Even though M spike went up slightly, it has been stable over the last several months.  Light chains have gone up slightly based on  worsening renal function.  I did not recommend any changes to the current regimen.  I plan to follow her up in 2 months with repeat labs.   2.  Shortness of breath on exertion: - She will continue bronchodilators and diuretics.  Continue follow-up with pulmonology.   3.  ID prophylaxis: - Continue acyclovir twice daily and aspirin 81 mg for thromboprophylaxis.   4.  Neuropathy: - Continue gabapentin 300 mg 2-3 times daily.   5.  Lower extremity edema: - Dr. Theador Hawthorne has started her on Lasix 20 mg twice daily.  She is also taking metolazone 2.5 mg on Monday, Wednesday and Friday.  Creatinine is 1.33. - She still has 2+ edema bilaterally.  She was instructed to call Dr. Toya Smothers office to titrate up Lasix.   6.  Myeloma bone disease: - Zometa was held after last infusion on 01/27/2020 due to worsening renal function.  She already received 2 years worth of Zometa on and off.   7.  Hypokalemia: - Continue potassium supplements.  Potassium today is 3.4.   8.  Bilateral knee pains: - Continue Percocet twice daily as needed.  This is well controlled.   Orders placed this encounter:  No orders of the defined types were placed in this encounter.    Derek Jack, MD [place] [phone]   I, Thana Ates, am acting as a scribe for Dr. Derek Jack.  I, Derek Jack MD, have reviewed the above documentation for accuracy and completeness, and I agree with the above.

## 2020-11-11 ENCOUNTER — Inpatient Hospital Stay (HOSPITAL_COMMUNITY): Payer: Medicare Other | Attending: Hematology | Admitting: Hematology

## 2020-11-11 ENCOUNTER — Other Ambulatory Visit: Payer: Self-pay

## 2020-11-11 VITALS — BP 121/80 | HR 80 | Temp 98.9°F | Resp 20 | Wt 360.7 lb

## 2020-11-11 DIAGNOSIS — Z9481 Bone marrow transplant status: Secondary | ICD-10-CM | POA: Diagnosis not present

## 2020-11-11 DIAGNOSIS — M2550 Pain in unspecified joint: Secondary | ICD-10-CM | POA: Insufficient documentation

## 2020-11-11 DIAGNOSIS — Z8042 Family history of malignant neoplasm of prostate: Secondary | ICD-10-CM | POA: Diagnosis not present

## 2020-11-11 DIAGNOSIS — Z8719 Personal history of other diseases of the digestive system: Secondary | ICD-10-CM | POA: Insufficient documentation

## 2020-11-11 DIAGNOSIS — E876 Hypokalemia: Secondary | ICD-10-CM | POA: Insufficient documentation

## 2020-11-11 DIAGNOSIS — Z79899 Other long term (current) drug therapy: Secondary | ICD-10-CM | POA: Insufficient documentation

## 2020-11-11 DIAGNOSIS — R0602 Shortness of breath: Secondary | ICD-10-CM | POA: Diagnosis not present

## 2020-11-11 DIAGNOSIS — C9000 Multiple myeloma not having achieved remission: Secondary | ICD-10-CM | POA: Insufficient documentation

## 2020-11-11 DIAGNOSIS — R600 Localized edema: Secondary | ICD-10-CM | POA: Insufficient documentation

## 2020-11-11 DIAGNOSIS — Z8249 Family history of ischemic heart disease and other diseases of the circulatory system: Secondary | ICD-10-CM | POA: Insufficient documentation

## 2020-11-11 DIAGNOSIS — Z809 Family history of malignant neoplasm, unspecified: Secondary | ICD-10-CM | POA: Insufficient documentation

## 2020-11-11 DIAGNOSIS — Z7961 Long term (current) use of immunomodulator: Secondary | ICD-10-CM | POA: Insufficient documentation

## 2020-11-11 DIAGNOSIS — G629 Polyneuropathy, unspecified: Secondary | ICD-10-CM | POA: Diagnosis not present

## 2020-11-11 DIAGNOSIS — I10 Essential (primary) hypertension: Secondary | ICD-10-CM | POA: Insufficient documentation

## 2020-11-11 DIAGNOSIS — Z9484 Stem cells transplant status: Secondary | ICD-10-CM | POA: Insufficient documentation

## 2020-11-11 DIAGNOSIS — Z23 Encounter for immunization: Secondary | ICD-10-CM | POA: Diagnosis not present

## 2020-11-11 MED ORDER — INFLUENZA VAC SPLIT QUAD 0.5 ML IM SUSY
0.5000 mL | PREFILLED_SYRINGE | Freq: Once | INTRAMUSCULAR | Status: AC
  Administered 2020-11-11: 0.5 mL via INTRAMUSCULAR
  Filled 2020-11-11: qty 0.5

## 2020-11-11 NOTE — Progress Notes (Signed)
Patient in for visit with Dr. Delton Coombes. Flu Vaccination ordered. See Mar for administration information. Patient stable throughout injection. Patient discharged via wheelchair and in stable condition.

## 2020-11-11 NOTE — Patient Instructions (Addendum)
[facility] at [facility] Discharge Instructions  You were seen today by Dr. Katragadda. He went over your recent results. Dr. Katragadda will see you back in 2 months for labs and follow up.   Thank you for choosing [facility] at [facility] to provide your oncology and hematology care.  To afford each patient quality time with our provider, please arrive at least 15 minutes before your scheduled appointment time.   If you have a lab appointment with the Cancer Center please come in thru the Main Entrance and check in at the main information desk  You need to re-schedule your appointment should you arrive 10 or more minutes late.  We strive to give you quality time with our providers, and arriving late affects you and other patients whose appointments are after yours.  Also, if you no show three or more times for appointments you may be dismissed from the clinic at the providers discretion.     Again, thank you for choosing [facility].  Our hope is that these requests will decrease the amount of time that you wait before being seen by our physicians.       _____________________________________________________________  Should you have questions after your visit to [facility], please contact our office at [phone] between the hours of 8:00 a.m. and 4:30 p.m.  Voicemails left after 4:00 p.m. will not be returned until the following business day.  For prescription refill requests, have your pharmacy contact our office and allow 72 hours.    Cancer Center Support Programs:   > Cancer Support Group  2nd Tuesday of the month 1pm-2pm, Journey Room   

## 2020-11-16 ENCOUNTER — Other Ambulatory Visit (HOSPITAL_COMMUNITY): Payer: Self-pay

## 2020-11-16 MED ORDER — POMALIDOMIDE 4 MG PO CAPS: 4.0000 mg | ORAL_CAPSULE | Freq: Every day | ORAL | 0 refills | Status: DC

## 2020-11-16 NOTE — Telephone Encounter (Signed)
Chart reviewed. Pomalyst refilled per last office note with Dr. Katragadda.  

## 2020-11-17 ENCOUNTER — Other Ambulatory Visit (HOSPITAL_COMMUNITY): Payer: Self-pay

## 2020-11-17 MED ORDER — FUROSEMIDE 20 MG PO TABS: ORAL_TABLET | ORAL | 3 refills | Status: DC

## 2020-11-17 NOTE — Telephone Encounter (Signed)
Patient instructed to increase Lasix to 40mg  each morning and 20mg  each evening per Dr. Delton Coombes due to lower extremity edema.

## 2020-12-04 ENCOUNTER — Other Ambulatory Visit (HOSPITAL_COMMUNITY): Payer: Self-pay | Admitting: Hematology

## 2020-12-04 DIAGNOSIS — C9000 Multiple myeloma not having achieved remission: Secondary | ICD-10-CM

## 2020-12-06 ENCOUNTER — Encounter (HOSPITAL_COMMUNITY): Payer: Self-pay | Admitting: Hematology

## 2020-12-08 NOTE — Progress Notes (Signed)
Cardiology Office Note:    Date:  12/09/2020   ID:  [name], DOB 12-09-1955, MRN [id number]  PCP:  Alfonse Flavors, MD   [facility] Providers Cardiologist:  Werner Lean, MD     Referring MD: Alfonse Flavors,*   CC: Shortness of breath Consulted for the evaluation of HFpEF at the behest of Zhou-Talbert, Elwyn Lade, MD  History of Present Illness:    Kelli Hurley is a 65 y.o. female with a hx of HTN, morbid obesity, OSA on nocturnal O2.  CKD Stage IIIb who presents for evaluation 12/09/20.  Oncological History notable for: Malignancies: MM Surgery: N/A Chemotherapy: Bortezomib, Carflixomib, Lenalidomide Cessations for Toxicity: Carflixomib fluid retention without change in EF Radiation: None Oncology care spearheaded by: Dr. Delton Coombes  Patient notes that she is feeling shortness of breath.    Has had no chest pain, chest pressure, chest tightness, chest stinging.    Patient exertion notable for breathing issues with walking.  She cannot walk very far without DOE.  No shortness of breath at rest.  No PND or orthopnea.  Notes weight gain, and leg swelling but this has improved on lasix; no abdominal swelling.  No syncope or near syncope. Notes  no palpitations or funny heart beats.   No leg claudication.  Ambulatory BP not done .  Past Medical History:  Diagnosis Date   Anemia    Arthritis    Cancer ([place])    Claustrophobia 10/05/2014   Hypertension    Leukopenia 08/03/2014   Normocytic hypochromic anemia 08/03/2014   Renal disorder    stage 3     Past Surgical History:  Procedure Laterality Date   ABDOMINAL HYSTERECTOMY     CARDIAC SURGERY     86 months old. States she had a leaky valve.    COLONOSCOPY  03/26/2009   Dr. Oneida Alar; normal colon, small internal hemorrhoids.  Recommended repeat colonoscopy in 10 years.   COLONOSCOPY WITH PROPOFOL N/A 11/25/2019   Procedure: COLONOSCOPY WITH PROPOFOL;  Surgeon: Eloise Harman,  DO;  Location: [facility];  Service: Endoscopy;  Laterality: N/A;  10:45am   OTHER SURGICAL HISTORY     heart surgery as infant to "repair hole in heart"   PORTACATH PLACEMENT Left 02/21/2019   Procedure: INSERTION PORT-A-CATH;  Surgeon: Virl Cagey, MD;  Location: [facility] ORS;  Service: General;  Laterality: Left;    Current Medications: Current Meds  Medication Sig   acetaminophen (TYLENOL) 650 MG CR tablet Take 650 mg by mouth every 8 (eight) hours as needed for pain.   acyclovir (ZOVIRAX) 400 MG tablet TAKE 1 TABLET BY MOUTH TWICE DAILY   albuterol (VENTOLIN HFA) 108 (90 Base) MCG/ACT inhaler Inhale 2 puffs into the lungs every 6 (six) hours as needed for wheezing or shortness of breath.   ALPRAZolam (XANAX) 1 MG tablet Take 1 mg by mouth daily as needed.   Ascorbic Acid (VITAMIN C) 1000 MG tablet Take 1,000 mg by mouth daily.    aspirin EC 81 MG tablet Take 81 mg by mouth daily.   calcium carbonate (OS-CAL) 1250 (500 Ca) MG chewable tablet Chew 1 tablet by mouth daily.    cholecalciferol (VITAMIN D) 1000 units tablet Take 1,000 Units by mouth daily.   dapagliflozin propanediol (FARXIGA) 10 MG TABS tablet Take 1 tablet (10 mg total) by mouth daily before breakfast.   dexamethasone (DECADRON) 4 MG tablet Take 5 tablets once a week   Elastic Bandages & Supports (MEDICAL  COMPRESSION STOCKINGS) MISC See admin instructions.   Elastic Bandages & Supports (WRIST SPLINT) MISC See admin instructions.   furosemide (LASIX) 20 MG tablet Take 2 tablets (40 mg total) by mouth in the morning AND 1 tablet (20 mg total) at bedtime.   gabapentin (NEURONTIN) 300 MG capsule TAKE (1) CAPSULE BY MOUTH TWICE DAILY.   lidocaine-prilocaine (EMLA) cream Apply a quarter sized glob 1 hour prior to treatment.   losartan (COZAAR) 25 MG tablet Take 12.5 mg by mouth daily.   magnesium oxide (MAG-OX) 400 (240 Mg) MG tablet TAKE 1 TABLET BY MOUTH TWICE DAILY   metolazone (ZAROXOLYN) 2.5 MG tablet TAKE 1 TABLET BY  MOUTH ONCE DAILY.   Multiple Vitamin (TAB-A-VITE) TABS TAKE 1 TABLET BY MOUTH ONCE DAILY. (Patient taking differently: Take 1 tablet by mouth daily.)   oxyCODONE-acetaminophen (PERCOCET/ROXICET) 5-325 MG tablet Take 1-2 tablets by mouth every 12 (twelve) hours as needed for severe pain.   pomalidomide (POMALYST) 4 MG capsule Take 1 capsule (4 mg total) by mouth daily. 21 days on, 7 days off   potassium chloride SA (KLOR-CON) 20 MEQ tablet TAKE (1) TABLET BY MOUTH THREE TIMES DAILY   trolamine salicylate (ASPERCREME) 10 % cream Apply 1 application topically 2 (two) times daily as needed for muscle pain.     Allergies:   Diclofenac   Social History   Socioeconomic History   Marital status: Legally Separated    Spouse name: Not on file   Number of children: Not on file   Years of education: Not on file   Highest education level: Not on file  Occupational History   Not on file  Tobacco Use   Smoking status: Never   Smokeless tobacco: Never  Vaping Use   Vaping Use: Never used  Substance and Sexual Activity   Alcohol use: No   Drug use: No   Sexual activity: Not on file    Comment: divorced- 2 daughters  Other Topics Concern   Not on file  Social History Narrative   Not on file   Social Determinants of Health   Financial Resource Strain: Low Risk    Difficulty of Paying Living Expenses: Not hard at all  Food Insecurity: No Food Insecurity   Worried About Charity fundraiser in the Last Year: Never true   [place] in the Last Year: Never true  Transportation Needs: No Transportation Needs   Lack of Transportation (Medical): No   Lack of Transportation (Non-Medical): No  Physical Activity: Inactive   Days of Exercise per Week: 0 days   Minutes of Exercise per Session: 0 min  Stress: No Stress Concern Present   Feeling of Stress : Not at all  Social Connections: Unknown   Frequency of Communication with Friends and Family: More than three times a week   Frequency of  Social Gatherings with Friends and Family: Once a week   Attends Religious Services: More than 4 times per year   Active Member of [employer] or Organizations: No   Attends Music therapist: Never   Marital Status: Not on file     Family History: The patient's family history includes Cancer in her father, maternal grandmother, and mother; Hypertension in her brother, father, mother, and sister; Prostate cancer in her brother. There is no history of Colon cancer or Colon polyps.  ROS:   Please see the history of present illness.     All other systems reviewed and are negative.  EKGs/Labs/Other  Studies Reviewed:    The following studies were reviewed today:  EKG:  EKG is  ordered today.  The ekg ordered today demonstrates  12/08/20: Sinus rhythm rate 79 RAD  Transthoracic Echocardiogram: Date: 10/29/20 Results:  1. Left ventricular ejection fraction, by estimation, is 60 to 65%. The  left ventricle has normal function. The left ventricle has no regional  wall motion abnormalities. There is mild left ventricular hypertrophy.  Left ventricular diastolic parameters  are indeterminate.   2. Right ventricular systolic function is normal. The right ventricular  size is normal. Tricuspid regurgitation signal is inadequate for assessing  PA pressure.   3. The mitral valve is normal in structure. No evidence of mitral valve  regurgitation. No evidence of mitral stenosis.   4. The aortic valve has an indeterminant number of cusps. Aortic valve  regurgitation is not visualized. No aortic stenosis is present.   5. The inferior vena cava is normal in size with greater than 50%  respiratory variability, suggesting right atrial pressure of 3 mmHg.   CTPE: Date:02/14/19 Results: Port No CAC  CMR: Date: 03/07/19 Results: IMPRESSION: 1. Normal cardiac MRI   2.  No evidence of cardiac amyloidosis   3.  Normal LV size and function EF 56%   4. No hyper-enhancement of LV myocardium  on delayed inversion recovery sequences   5.  Normal RV size and function     Recent Labs: 10/27/2020: ALT 14; BUN 31; Creatinine, Ser 1.33; Hemoglobin 10.5; Magnesium 1.9; Platelets 239; Potassium 3.4; Sodium 134  Recent Lipid Panel No results found for: CHOL, TRIG, HDL, CHOLHDL, VLDL, LDLCALC, LDLDIRECT       Physical Exam:    VS:  BP 132/72   Pulse 77   Ht _0  (1.575 m)   Wt (!) 353 lb (160.1 kg)   SpO2 97%   BMI 64.56 kg/m     Wt Readings from Last 3 Encounters:  12/09/20 (!) 353 lb (160.1 kg)  11/11/20 (!) 360 lb 10.8 oz (163.6 kg)  08/25/20 (!) 349 lb (158.3 kg)     GEN: Obese well developed in no acute distress HEENT: Normal NECK: JVD LYMPHATICS: No lymphadenopathy CARDIAC: RRR, no murmurs, rubs, gallops, distant heart sounds RESPIRATORY:  Clear to auscultation without rales, wheezing or rhonchi  ABDOMEN: Soft, non-tender, non-distended MUSCULOSKELETAL:  +2 edema bilaterally; No deformity  SKIN: Warm and dry NEUROLOGIC:  Alert and oriented x 3 PSYCHIATRIC:  Normal affect   ASSESSMENT:    1. Heart failure with preserved ejection fraction, unspecified HF chronicity ([place])   2. Hypertension, unspecified type   3. Multiple myeloma, remission status unspecified (HCC)   4. Stage 3 chronic kidney disease, unspecified whether stage 3a or 3b CKD ([place])   5. Morbid (severe) obesity due to excess calories (HCC)    PLAN:    Multiple Myeloma on steroids Morbid Obesity and OHS CKD stage IIIa OSA on nocturnal O2 HFpEF Multi-factorial SOB from above - NYHA class III, Stage C, hypervolemic - will start SGLT2i and get labs in two weeks - will continue lasix 40 mg PO daily and 20 mg PO nightly, last creatinine in 10/22 was 1.53 per outside records - may need high diuretic dose, we discuss this could affect GFR  Will plan for three months follow up unless new symptoms or abnormal test results warranting change in plan  Would be reasonable for  [facility] Follow  up    Medication Adjustments/Labs and Tests Ordered: Current medicines are reviewed at length  with the patient today.  Concerns regarding medicines are outlined above.  Orders Placed This Encounter  Procedures   Basic metabolic panel   B Nat Peptide   Magnesium   EKG 12-Lead   Meds ordered this encounter  Medications   dapagliflozin propanediol (FARXIGA) 10 MG TABS tablet    Sig: Take 1 tablet (10 mg total) by mouth daily before breakfast.    Dispense:  30 tablet    Refill:  6     Patient Instructions  Medication Instructions:   START Farxiga 10 mg daily  *If you need a refill on your cardiac medications before your next appointment, please call your pharmacy*   Lab Work:  BMET,BNP,Magnesium in 2 weeks at [facility], out patient lab  If you have labs (blood work) drawn today and your tests are completely normal, you will receive your results only by: [employer] (if you have MyChart) OR A paper copy in the mail If you have any lab test that is abnormal or we need to change your treatment, we will call you to review the results.   Testing/Procedures: None ordered    Follow-Up: At [facility], you and your health needs are our priority.  As part of our continuing mission to provide you with exceptional heart care, we have created designated Provider Care Teams.  These Care Teams include your primary Cardiologist (physician) and Advanced Practice Providers (APPs -  Physician Assistants and Nurse Practitioners) who all work together to provide you with the care you need, when you need it.  We recommend signing up for the patient portal called "MyChart".  Sign up information is provided on this After Visit Summary.  MyChart is used to connect with patients for Virtual Visits (Telemedicine).  Patients are able to view lab/test results, encounter notes, upcoming appointments, etc.  Non-urgent messages can be sent to your provider as well.   To learn more  about what you can do with MyChart, go to [email or web address].    Your next appointment:   3 month(s)  The format for your next appointment:   In Person  Provider:   Dr.Earmon Sherrow    Other Instructions None     Samples given : Farxiga 10 mg, # 14, lot [id number], exp 09/06/2022         Thank you for choosing [facility] !         Signed, Werner Lean, MD  12/09/2020 12:48 PM    [place]

## 2020-12-09 ENCOUNTER — Other Ambulatory Visit: Payer: Self-pay

## 2020-12-09 ENCOUNTER — Ambulatory Visit (INDEPENDENT_AMBULATORY_CARE_PROVIDER_SITE_OTHER): Payer: Medicare Other | Admitting: Internal Medicine

## 2020-12-09 ENCOUNTER — Encounter: Payer: Self-pay | Admitting: Internal Medicine

## 2020-12-09 VITALS — BP 132/72 | HR 77 | Ht 62.0 in | Wt 353.0 lb

## 2020-12-09 DIAGNOSIS — I10 Essential (primary) hypertension: Secondary | ICD-10-CM | POA: Diagnosis not present

## 2020-12-09 DIAGNOSIS — I5030 Unspecified diastolic (congestive) heart failure: Secondary | ICD-10-CM

## 2020-12-09 DIAGNOSIS — C9000 Multiple myeloma not having achieved remission: Secondary | ICD-10-CM

## 2020-12-09 DIAGNOSIS — N183 Chronic kidney disease, stage 3 unspecified: Secondary | ICD-10-CM

## 2020-12-09 MED ORDER — DAPAGLIFLOZIN PROPANEDIOL 10 MG PO TABS: 10.0000 mg | ORAL_TABLET | Freq: Every day | ORAL | 6 refills | Status: DC

## 2020-12-09 NOTE — Patient Instructions (Signed)
Medication Instructions:   START Farxiga 10 mg daily  *If you need a refill on your cardiac medications before your next appointment, please call your pharmacy*   Lab Work:  BMET,BNP,Magnesium in 2 weeks at [facility], out patient lab  If you have labs (blood work) drawn today and your tests are completely normal, you will receive your results only by: [facility] (if you have MyChart) OR A paper copy in the mail If you have any lab test that is abnormal or we need to change your treatment, we will call you to review the results.   Testing/Procedures: None ordered    Follow-Up: At [facility], you and your health needs are our priority.  As part of our continuing mission to provide you with exceptional heart care, we have created designated Provider Care Teams.  These Care Teams include your primary Cardiologist (physician) and Advanced Practice Providers (APPs -  Physician Assistants and Nurse Practitioners) who all work together to provide you with the care you need, when you need it.  We recommend signing up for the patient portal called "MyChart".  Sign up information is provided on this After Visit Summary.  MyChart is used to connect with patients for Virtual Visits (Telemedicine).  Patients are able to view lab/test results, encounter notes, upcoming appointments, etc.  Non-urgent messages can be sent to your provider as well.   To learn more about what you can do with MyChart, go to [email or web address].    Your next appointment:   3 month(s)  The format for your next appointment:   In Person  Provider:   Dr.Chandrasekhar    Other Instructions None     Samples given : Farxiga 10 mg, # 14, lot [id number], exp 09/06/2022         Thank you for choosing [facility] !

## 2020-12-14 ENCOUNTER — Other Ambulatory Visit (HOSPITAL_COMMUNITY): Payer: Self-pay

## 2020-12-14 MED ORDER — POMALIDOMIDE 4 MG PO CAPS: 4.0000 mg | ORAL_CAPSULE | Freq: Every day | ORAL | 0 refills | Status: DC

## 2020-12-14 NOTE — Telephone Encounter (Signed)
Chart reviewed. Pomalyst refilled per last office note with Dr. Katragadda.  

## 2020-12-20 ENCOUNTER — Other Ambulatory Visit (HOSPITAL_COMMUNITY): Payer: Self-pay

## 2020-12-20 DIAGNOSIS — G8929 Other chronic pain: Secondary | ICD-10-CM

## 2020-12-20 DIAGNOSIS — M25561 Pain in right knee: Secondary | ICD-10-CM

## 2020-12-20 MED ORDER — OXYCODONE-ACETAMINOPHEN 5-325 MG PO TABS: 1.0000 | ORAL_TABLET | Freq: Two times a day (BID) | ORAL | 0 refills | Status: DC | PRN

## 2020-12-24 ENCOUNTER — Other Ambulatory Visit (HOSPITAL_COMMUNITY)
Admission: RE | Admit: 2020-12-24 | Discharge: 2020-12-24 | Disposition: A | Discharge status: Home / Self Care | Payer: Medicare Other | Source: Ambulatory Visit | Attending: Internal Medicine | Admitting: Internal Medicine

## 2020-12-24 DIAGNOSIS — I5030 Unspecified diastolic (congestive) heart failure: Secondary | ICD-10-CM | POA: Diagnosis present

## 2020-12-24 LAB — BASIC METABOLIC PANEL
Anion gap: 10 (ref 5–15)
BUN: 35 mg/dL — ABNORMAL HIGH (ref 8–23)
CO2: 32 mmol/L (ref 22–32)
Calcium: 9.7 mg/dL (ref 8.9–10.3)
Chloride: 96 mmol/L — ABNORMAL LOW (ref 98–111)
Creatinine, Ser: 1.52 mg/dL — ABNORMAL HIGH (ref 0.44–1.00)
GFR, Estimated: 38 mL/min — ABNORMAL LOW (ref >60)
Glucose, Bld: 101 mg/dL — ABNORMAL HIGH (ref 70–99)
Potassium: 3.7 mmol/L (ref 3.5–5.1)
Sodium: 138 mmol/L (ref 135–145)

## 2020-12-24 LAB — MAGNESIUM: Magnesium: 2 mg/dL (ref 1.7–2.4)

## 2020-12-24 LAB — BRAIN NATRIURETIC PEPTIDE: B Natriuretic Peptide: 64 pg/mL (ref 0.0–100.0)

## 2020-12-28 ENCOUNTER — Inpatient Hospital Stay (HOSPITAL_COMMUNITY): Payer: Medicare Other | Attending: Physician Assistant

## 2020-12-28 ENCOUNTER — Telehealth: Payer: Self-pay

## 2020-12-28 ENCOUNTER — Other Ambulatory Visit: Payer: Self-pay

## 2020-12-28 DIAGNOSIS — G629 Polyneuropathy, unspecified: Secondary | ICD-10-CM | POA: Diagnosis not present

## 2020-12-28 DIAGNOSIS — Z8042 Family history of malignant neoplasm of prostate: Secondary | ICD-10-CM | POA: Insufficient documentation

## 2020-12-28 DIAGNOSIS — M25562 Pain in left knee: Secondary | ICD-10-CM | POA: Insufficient documentation

## 2020-12-28 DIAGNOSIS — Z9484 Stem cells transplant status: Secondary | ICD-10-CM | POA: Diagnosis not present

## 2020-12-28 DIAGNOSIS — Z79899 Other long term (current) drug therapy: Secondary | ICD-10-CM | POA: Diagnosis not present

## 2020-12-28 DIAGNOSIS — C9000 Multiple myeloma not having achieved remission: Secondary | ICD-10-CM | POA: Diagnosis not present

## 2020-12-28 DIAGNOSIS — Z8719 Personal history of other diseases of the digestive system: Secondary | ICD-10-CM | POA: Insufficient documentation

## 2020-12-28 DIAGNOSIS — Z9481 Bone marrow transplant status: Secondary | ICD-10-CM | POA: Diagnosis not present

## 2020-12-28 DIAGNOSIS — Z7984 Long term (current) use of oral hypoglycemic drugs: Secondary | ICD-10-CM | POA: Insufficient documentation

## 2020-12-28 DIAGNOSIS — M2550 Pain in unspecified joint: Secondary | ICD-10-CM | POA: Diagnosis not present

## 2020-12-28 DIAGNOSIS — E876 Hypokalemia: Secondary | ICD-10-CM | POA: Diagnosis not present

## 2020-12-28 DIAGNOSIS — M25561 Pain in right knee: Secondary | ICD-10-CM | POA: Diagnosis not present

## 2020-12-28 DIAGNOSIS — R0602 Shortness of breath: Secondary | ICD-10-CM | POA: Diagnosis not present

## 2020-12-28 DIAGNOSIS — Z7952 Long term (current) use of systemic steroids: Secondary | ICD-10-CM | POA: Insufficient documentation

## 2020-12-28 DIAGNOSIS — M25473 Effusion, unspecified ankle: Secondary | ICD-10-CM | POA: Diagnosis not present

## 2020-12-28 DIAGNOSIS — I10 Essential (primary) hypertension: Secondary | ICD-10-CM | POA: Insufficient documentation

## 2020-12-28 DIAGNOSIS — Z7961 Long term (current) use of immunomodulator: Secondary | ICD-10-CM | POA: Insufficient documentation

## 2020-12-28 DIAGNOSIS — M7989 Other specified soft tissue disorders: Secondary | ICD-10-CM | POA: Diagnosis not present

## 2020-12-28 DIAGNOSIS — Z809 Family history of malignant neoplasm, unspecified: Secondary | ICD-10-CM | POA: Insufficient documentation

## 2020-12-28 DIAGNOSIS — R600 Localized edema: Secondary | ICD-10-CM | POA: Diagnosis not present

## 2020-12-28 DIAGNOSIS — D630 Anemia in neoplastic disease: Secondary | ICD-10-CM | POA: Diagnosis not present

## 2020-12-28 DIAGNOSIS — Z8249 Family history of ischemic heart disease and other diseases of the circulatory system: Secondary | ICD-10-CM | POA: Diagnosis not present

## 2020-12-28 LAB — CBC WITH DIFFERENTIAL/PLATELET
Abs Immature Granulocytes: 0.06 10*3/uL (ref 0.00–0.07)
Basophils Absolute: 0.1 10*3/uL (ref 0.0–0.1)
Basophils Relative: 2 %
Eosinophils Absolute: 0.1 10*3/uL (ref 0.0–0.5)
Eosinophils Relative: 2 %
HCT: 32.4 % — ABNORMAL LOW (ref 36.0–46.0)
Hemoglobin: 10.3 g/dL — ABNORMAL LOW (ref 12.0–15.0)
Immature Granulocytes: 2 %
Lymphocytes Relative: 17 %
Lymphs Abs: 0.7 10*3/uL (ref 0.7–4.0)
MCH: 30.8 pg (ref 26.0–34.0)
MCHC: 31.8 g/dL (ref 30.0–36.0)
MCV: 97 fL (ref 80.0–100.0)
Monocytes Absolute: 0.8 10*3/uL (ref 0.1–1.0)
Monocytes Relative: 20 %
Neutro Abs: 2.3 10*3/uL (ref 1.7–7.7)
Neutrophils Relative %: 57 %
Platelets: 228 10*3/uL (ref 150–400)
RBC: 3.34 MIL/uL — ABNORMAL LOW (ref 3.87–5.11)
RDW: 14.8 % (ref 11.5–15.5)
WBC: 3.9 10*3/uL — ABNORMAL LOW (ref 4.0–10.5)
nRBC: 0 % (ref 0.0–0.2)

## 2020-12-28 LAB — MAGNESIUM: Magnesium: 2 mg/dL (ref 1.7–2.4)

## 2020-12-28 LAB — COMPREHENSIVE METABOLIC PANEL
ALT: 13 U/L (ref 0–44)
AST: 15 U/L (ref 15–41)
Albumin: 3.7 g/dL (ref 3.5–5.0)
Alkaline Phosphatase: 45 U/L (ref 38–126)
Anion gap: 10 (ref 5–15)
BUN: 27 mg/dL — ABNORMAL HIGH (ref 8–23)
CO2: 31 mmol/L (ref 22–32)
Calcium: 9.5 mg/dL (ref 8.9–10.3)
Chloride: 96 mmol/L — ABNORMAL LOW (ref 98–111)
Creatinine, Ser: 1.5 mg/dL — ABNORMAL HIGH (ref 0.44–1.00)
GFR, Estimated: 39 mL/min — ABNORMAL LOW (ref >60)
Glucose, Bld: 119 mg/dL — ABNORMAL HIGH (ref 70–99)
Potassium: 3.2 mmol/L — ABNORMAL LOW (ref 3.5–5.1)
Sodium: 137 mmol/L (ref 135–145)
Total Bilirubin: 0.7 mg/dL (ref 0.3–1.2)
Total Protein: 6.6 g/dL (ref 6.5–8.1)

## 2020-12-28 LAB — LACTATE DEHYDROGENASE: LDH: 145 U/L (ref 98–192)

## 2020-12-28 MED ORDER — SODIUM CHLORIDE 0.9% FLUSH
10.0000 mL | INTRAVENOUS | Status: DC | PRN
  Administered 2020-12-28: 10 mL via INTRAVENOUS

## 2020-12-28 MED ORDER — FUROSEMIDE 20 MG PO TABS: ORAL_TABLET | ORAL | 3 refills | Status: DC

## 2020-12-28 MED ORDER — HEPARIN SOD (PORK) LOCK FLUSH 100 UNIT/ML IV SOLN
500.0000 [IU] | Freq: Once | INTRAVENOUS | Status: AC
  Administered 2020-12-28: 500 [IU] via INTRAVENOUS

## 2020-12-28 NOTE — Patient Instructions (Signed)
[facility]  Discharge Instructions: Thank you for choosing [facility] to provide your oncology and hematology care.  If you have a lab appointment with the Cancer Center, please come in thru the Main Entrance and check in at the main information desk.  Wear comfortable clothing and clothing appropriate for easy access to any Portacath or PICC line.   We strive to give you quality time with your provider. You may need to reschedule your appointment if you arrive late (15 or more minutes).  Arriving late affects you and other patients whose appointments are after yours.  Also, if you miss three or more appointments without notifying the office, you may be dismissed from the clinic at the provider's discretion.      For prescription refill requests, have your pharmacy contact our office and allow 72 hours for refills to be completed.        To help prevent nausea and vomiting after your treatment, we encourage you to take your nausea medication as directed.  BELOW ARE SYMPTOMS THAT SHOULD BE REPORTED IMMEDIATELY: *FEVER GREATER THAN 100.4 F (38 C) OR HIGHER *CHILLS OR SWEATING *NAUSEA AND VOMITING THAT IS NOT CONTROLLED WITH YOUR NAUSEA MEDICATION *UNUSUAL SHORTNESS OF BREATH *UNUSUAL BRUISING OR BLEEDING *URINARY PROBLEMS (pain or burning when urinating, or frequent urination) *BOWEL PROBLEMS (unusual diarrhea, constipation, pain near the anus) TENDERNESS IN MOUTH AND THROAT WITH OR WITHOUT PRESENCE OF ULCERS (sore throat, sores in mouth, or a toothache) UNUSUAL RASH, SWELLING OR PAIN  UNUSUAL VAGINAL DISCHARGE OR ITCHING   Items with * indicate a potential emergency and should be followed up as soon as possible or go to the Emergency Department if any problems should occur.  Please show the CHEMOTHERAPY ALERT CARD or IMMUNOTHERAPY ALERT CARD at check-in to the Emergency Department and triage nurse.  Should you have questions after your visit or need to cancel  or reschedule your appointment, please contact [facility] [phone]  and follow the prompts.  Office hours are 8:00 a.m. to 4:30 p.m. Monday - Friday. Please note that voicemails left after 4:00 p.m. may not be returned until the following business day.  We are closed weekends and major holidays. You have access to a nurse at all times for urgent questions. Please call the main number to the clinic [phone] and follow the prompts.  For any non-urgent questions, you may also contact your provider using MyChart. We now offer e-Visits for anyone 18 and older to request care online for non-urgent symptoms. For details visit [email or web address].   Also download the MyChart app! Go to the app store, search "MyChart", open the app, select [facility], and log in with your MyChart username and password.  Due to Covid, a mask is required upon entering the hospital/clinic. If you do not have a mask, one will be given to you upon arrival. For doctor visits, patients may have 1 support person aged 18 or older with them. For treatment visits, patients cannot have anyone with them due to current Covid guidelines and our immunocompromised population.  

## 2020-12-28 NOTE — Telephone Encounter (Signed)
-----   Message from Precious Gilding, RN sent at 12/28/2020 11:33 AM EST -----  ----- Message ----- From: Werner Lean, MD Sent: 12/27/2020   1:22 PM EST To: Precious Gilding, RN  Results: No change in Creatinine from 10/22 OSH records, Improved BG Plan: If no change in SOB, increase to lasix 40 mg PO BID and BMP in two weeks  Werner Lean, MD

## 2020-12-28 NOTE — Progress Notes (Signed)
Patients port flushed without difficulty.  Good blood return noted with no bruising or swelling noted at site.  Band aid applied.  VSS with discharge and left in satisfactory condition with no s/s of distress noted.   

## 2020-12-28 NOTE — Telephone Encounter (Signed)
Pt notified and verbalized understanding. Pt to increase lasix to 40 mg po bid with BMP in 2 weeks.

## 2020-12-29 ENCOUNTER — Other Ambulatory Visit (HOSPITAL_COMMUNITY): Payer: Medicare Other

## 2020-12-29 LAB — KAPPA/LAMBDA LIGHT CHAINS
Kappa free light chain: 30.2 mg/L — ABNORMAL HIGH (ref 3.3–19.4)
Kappa, lambda light chain ratio: 1.83 — ABNORMAL HIGH (ref 0.26–1.65)
Lambda free light chains: 16.5 mg/L (ref 5.7–26.3)

## 2020-12-30 LAB — PROTEIN ELECTROPHORESIS, SERUM
A/G Ratio: 1.3 (ref 0.7–1.7)
Albumin ELP: 3.3 g/dL (ref 2.9–4.4)
Alpha-1-Globulin: 0.2 g/dL (ref 0.0–0.4)
Alpha-2-Globulin: 0.7 g/dL (ref 0.4–1.0)
Beta Globulin: 0.9 g/dL (ref 0.7–1.3)
Gamma Globulin: 0.7 g/dL (ref 0.4–1.8)
Globulin, Total: 2.5 g/dL (ref 2.2–3.9)
M-Spike, %: 0.2 g/dL — ABNORMAL HIGH
Total Protein ELP: 5.8 g/dL — ABNORMAL LOW (ref 6.0–8.5)

## 2021-01-03 LAB — IMMUNOFIXATION ELECTROPHORESIS
IgA: 199 mg/dL (ref 87–352)
IgG (Immunoglobin G), Serum: 831 mg/dL (ref 586–1602)
IgM (Immunoglobulin M), Srm: 62 mg/dL (ref 26–217)
Total Protein ELP: 6.2 g/dL (ref 6.0–8.5)

## 2021-01-04 NOTE — Progress Notes (Signed)
[place], [place] [address]   CLINIC:  Medical Oncology/Hematology  PCP:  Alfonse Flavors, MD [place] / [place] [id number] [phone]   REASON FOR VISIT:  Follow-up for multiple myeloma  PRIOR THERAPY:  1. Bortezomib x 8 cycles from 10/05/2014 to 04/06/2016. 2. Carfilzomib x 6 cycles from 03/05/2019 to 08/06/2019.  NGS Results: not done  CURRENT THERAPY: Pomalyst 4 mg 3/4 weeks  BRIEF ONCOLOGIC HISTORY:  Oncology History  Multiple myeloma ([place])  08/07/2014 Imaging   Bone Survey- No lytic lesions are noted in the visualized skeleton.   09/03/2014 Bone Marrow Biopsy   NORMOCELLULAR BONE MARROW FOR AGE WITH PLASMA CELL NEOPLASM.  The plasma cell component is increased in the marrow representing an estimated 18% of all cells. Cytogenetics with 13q-, 17p- (high risk disease)   09/03/2014 Pathology Results   Cytogenetics with 13q-, 17p- (high risk disease)   09/23/2014 Initial Diagnosis   Multiple myeloma   09/30/2014 PET scan   No abnormal hypermetabolism in the neck, chest, abdomen or pelvis.   10/05/2014 - 03/22/2015 Chemotherapy   RVD   10/28/2014 Treatment Plan Change   Issues related to getting Revlimid in a timely fashion, therefore, she received her Revlimid on 9/21 resulting in a 12 day cycle this time instead of a 14 day cycle   11/02/2014 Imaging   CTA chest- No evidence for a large or central pulmonary embolism as described.  8 mm density along the right minor fissure could represent focal pleural thickening but indeterminate. If the patient is at high risk for bronchogenic carcinoma, follow-up c   11/23/2014 Miscellaneous   Zometa 4 mg IV monthly   04/07/2015 Bone Marrow Biopsy   Normocellular marrow with 2-4% clonal plasma cells by immunohistochemistry. FISH and cytogenetics were normal [facility])     04/2015 Miscellaneous   PRETRANSPLANT EVALUATION:  Pulmonary function tests: FEV1 100.3% / DLCO 97.9%   Echocardiogram: Normal LV function with EF 60-65%    04/27/2015 Procedure   Stem cell mobilization with filgrastim and [facility])   05/06/2015 Miscellaneous   BMT conditioning regimen with high-dose Melphalan given [facility] [name]); Day -1   05/07/2015 Bone Marrow Transplant   Outpatient autologous stem cell transplant [facility] [name]); Day 0   05/18/2015 Miscellaneous   WBC engraftment;  did not require platelet transfusion during her transplant process. Lowest platelet count 28,000    05/20/2015 Procedure   Tunneled catheter removed [facility])   09/08/2015 -  Chemotherapy   Velcade every 2 weeks   12/15/2015 Miscellaneous   Zometa re-instituted.    12/23/2015 Imaging   Bone density- BMD as determined from [facility] 33% is 0.799 g/cm2 with a T-Score of 1.2. This patient is considered normal according to [facility]) criteria.   04/17/2016 Miscellaneous   Started maintenance with N[place]o.   05/04/2016 Treatment Plan Change   Zometa switched to Xgeva injection monthly given difficult IV access.    03/05/2019 - 08/06/2019 Chemotherapy   The patient had carfilzomib (KYPROLIS) 40 mg in dextrose 5 % 50 mL chemo infusion, 18 mg/m2 = 44 mg, Intravenous,  Once, 6 of 6 cycles Dose modification: 56 mg/m2 (original dose 56 mg/m2, Cycle 1, Reason: Provider Judgment) Administration: 40 mg (03/05/2019), 120 mg (03/12/2019), 120 mg (03/20/2019), 120 mg (04/02/2019), 120 mg (04/09/2019), 120 mg (04/16/2019), 120 mg (04/30/2019), 120 mg (05/08/2019), 120 mg (05/14/2019), 120 mg (05/28/2019), 120 mg (06/04/2019), 120 mg (06/11/2019), 120 mg (  06/25/2019), 120 mg (07/02/2019), 120 mg (07/09/2019), 120 mg (07/23/2019), 120 mg (07/30/2019), 120 mg (08/06/2019)   for chemotherapy treatment.       CANCER STAGING: Cancer Staging  Multiple myeloma [facility]) Staging form: Multiple Myeloma, AJCC 6th Edition - Clinical stage from 10/26/2014: Stage IIA - Signed by Baird Cancer, PA-C on  10/26/2014 - Pathologic: No stage assigned - Unsigned   INTERVAL HISTORY:  Ms. HALYNN REITANO, a 65 y.o. female, returns for routine follow-up of her multiple myeloma. Mylena was last seen on 11/11/2020.   Today she reports feeling good. She is taking 40 mg Lasix BID and her ankle swellings have improved. She denies n/v/d, fevers, and infections. She reports stable SOB which is not helped by albuterol inhaler. Her tingling and numbness in her hands and feet are stable, and she denies any associated pain.   REVIEW OF SYSTEMS:  Review of Systems  Constitutional:  Negative for appetite change and fatigue (75%).  Respiratory:  Positive for shortness of breath.   Cardiovascular:  Positive for leg swelling (improved).  Musculoskeletal:  Positive for arthralgias (4/10 lnee).  Neurological:  Positive for numbness (hands and feet).  All other systems reviewed and are negative.  PAST MEDICAL/SURGICAL HISTORY:  Past Medical History:  Diagnosis Date   Anemia    Arthritis    Cancer ([place])    Claustrophobia 10/05/2014   Hypertension    Leukopenia 08/03/2014   Normocytic hypochromic anemia 08/03/2014   Renal disorder    stage 3    Past Surgical History:  Procedure Laterality Date   ABDOMINAL HYSTERECTOMY     CARDIAC SURGERY     37 months old. States she had a leaky valve.    COLONOSCOPY  03/26/2009   Dr. Oneida Alar; normal colon, small internal hemorrhoids.  Recommended repeat colonoscopy in 10 years.   COLONOSCOPY WITH PROPOFOL N/A 11/25/2019   Procedure: COLONOSCOPY WITH PROPOFOL;  Surgeon: Eloise Harman, DO;  Location: [facility];  Service: Endoscopy;  Laterality: N/A;  10:45am   OTHER SURGICAL HISTORY     heart surgery as infant to "repair hole in heart"   PORTACATH PLACEMENT Left 02/21/2019   Procedure: INSERTION PORT-A-CATH;  Surgeon: Virl Cagey, MD;  Location: [facility] ORS;  Service: General;  Laterality: Left;    SOCIAL HISTORY:  Social History   Socioeconomic History    Marital status: Legally Separated    Spouse name: Not on file   Number of children: Not on file   Years of education: Not on file   Highest education level: Not on file  Occupational History   Not on file  Tobacco Use   Smoking status: Never   Smokeless tobacco: Never  Vaping Use   Vaping Use: Never used  Substance and Sexual Activity   Alcohol use: No   Drug use: No   Sexual activity: Not on file    Comment: divorced- 2 daughters  Other Topics Concern   Not on file  Social History Narrative   Not on file   Social Determinants of Health   Financial Resource Strain: Low Risk    Difficulty of Paying Living Expenses: Not hard at all  Food Insecurity: No Food Insecurity   Worried About Charity fundraiser in the Last Year: Never true   Ran Out of Food in the Last Year: Never true  Transportation Needs: No Transportation Needs   Lack of Transportation (Medical): No   Lack of Transportation (Non-Medical): No  Physical Activity: Inactive  Days of Exercise per Week: 0 days   Minutes of Exercise per Session: 0 min  Stress: No Stress Concern Present   Feeling of Stress : Not at all  Social Connections: Unknown   Frequency of Communication with Friends and Family: More than three times a week   Frequency of Social Gatherings with Friends and Family: Once a week   Attends Religious Services: More than 4 times per year   Active Member of [employer] or Organizations: No   Attends Music therapist: Never   Marital Status: Not on file  Intimate Partner Violence: Not At Risk   Fear of Current or Ex-Partner: No   Emotionally Abused: No   Physically Abused: No   Sexually Abused: No    FAMILY HISTORY:  Family History  Problem Relation Age of Onset   Cancer Mother    Hypertension Mother    Cancer Father    Hypertension Father    Cancer Maternal Grandmother    Hypertension Sister    Hypertension Brother    Prostate cancer Brother    Colon cancer Neg Hx    Colon  polyps Neg Hx     CURRENT MEDICATIONS:  Current Outpatient Medications  Medication Sig Dispense Refill   acetaminophen (TYLENOL) 650 MG CR tablet Take 650 mg by mouth every 8 (eight) hours as needed for pain.     acyclovir (ZOVIRAX) 400 MG tablet TAKE 1 TABLET BY MOUTH TWICE DAILY 60 tablet 6   albuterol (VENTOLIN HFA) 108 (90 Base) MCG/ACT inhaler Inhale 2 puffs into the lungs every 6 (six) hours as needed for wheezing or shortness of breath. 8 g 3   ALPRAZolam (XANAX) 1 MG tablet Take 1 mg by mouth daily as needed.     Ascorbic Acid (VITAMIN C) 1000 MG tablet Take 1,000 mg by mouth daily.      aspirin EC 81 MG tablet Take 81 mg by mouth daily.     calcium carbonate (OS-CAL) 1250 (500 Ca) MG chewable tablet Chew 1 tablet by mouth daily.      cholecalciferol (VITAMIN D) 1000 units tablet Take 1,000 Units by mouth daily.     dapagliflozin propanediol (FARXIGA) 10 MG TABS tablet Take 1 tablet (10 mg total) by mouth daily before breakfast. 30 tablet 6   dexamethasone (DECADRON) 4 MG tablet Take 5 tablets once a week 30 tablet 6   Elastic Bandages & Supports (MEDICAL COMPRESSION STOCKINGS) MISC See admin instructions.     Elastic Bandages & Supports (WRIST SPLINT) MISC See admin instructions.     furosemide (LASIX) 20 MG tablet Take 2 tablets (40 mg total) by mouth in the morning AND 2 tablets (40 mg total) at bedtime. 180 tablet 3   gabapentin (NEURONTIN) 300 MG capsule TAKE (1) CAPSULE BY MOUTH TWICE DAILY. 60 capsule 5   lidocaine-prilocaine (EMLA) cream Apply a quarter sized glob 1 hour prior to treatment. 30 g 5   losartan (COZAAR) 25 MG tablet Take 12.5 mg by mouth daily.     magnesium oxide (MAG-OX) 400 (240 Mg) MG tablet TAKE 1 TABLET BY MOUTH TWICE DAILY 60 tablet 6   metolazone (ZAROXOLYN) 2.5 MG tablet TAKE 1 TABLET BY MOUTH ONCE DAILY. 30 tablet 6   Multiple Vitamin (TAB-A-VITE) TABS TAKE 1 TABLET BY MOUTH ONCE DAILY. (Patient taking differently: Take 1 tablet by mouth daily.) 30  tablet 11   oxyCODONE-acetaminophen (PERCOCET/ROXICET) 5-325 MG tablet Take 1-2 tablets by mouth every 12 (twelve) hours as needed for severe  pain. 60 tablet 0   pomalidomide (POMALYST) 4 MG capsule Take 1 capsule (4 mg total) by mouth daily. 21 days on, 7 days off 21 capsule 0   potassium chloride SA (KLOR-CON) 20 MEQ tablet TAKE (1) TABLET BY MOUTH THREE TIMES DAILY 90 tablet 6   trolamine salicylate (ASPERCREME) 10 % cream Apply 1 application topically 2 (two) times daily as needed for muscle pain.     No current facility-administered medications for this visit.   Facility-Administered Medications Ordered in Other Visits  Medication Dose Route Frequency Provider Last Rate Last Admin   heparin lock flush 100 unit/mL  500 Units Intravenous Once Penland, Kelby Fam, MD       sodium chloride 0.9 % injection 10 mL  10 mL Intravenous Once Penland, Kelby Fam, MD        ALLERGIES:  Allergies  Allergen Reactions   Diclofenac Swelling    Per pt, facial swelling    PHYSICAL EXAM:  Performance status (ECOG): 1 - Symptomatic but completely ambulatory  There were no vitals filed for this visit. Wt Readings from Last 3 Encounters:  12/09/20 (!) 353 lb (160.1 kg)  11/11/20 (!) 360 lb 10.8 oz (163.6 kg)  08/25/20 (!) 349 lb (158.3 kg)   Physical Exam Vitals reviewed.  Constitutional:      Appearance: Normal appearance. She is obese.  Cardiovascular:     Rate and Rhythm: Normal rate and regular rhythm.     Pulses: Normal pulses.     Heart sounds: Normal heart sounds.  Pulmonary:     Effort: Pulmonary effort is normal.     Breath sounds: Normal breath sounds.  Neurological:     General: No focal deficit present.     Mental Status: She is alert and oriented to person, place, and time.  Psychiatric:        Mood and Affect: Mood normal.        Behavior: Behavior normal.     LABORATORY DATA:  I have reviewed the labs as listed.  CBC Latest Ref Rng & Units 12/28/2020 10/27/2020 08/18/2020   WBC 4.0 - 10.5 K/uL 3.9(L) 4.1 5.8  Hemoglobin 12.0 - 15.0 g/dL 10.3(L) 10.5(L) 10.5(L)  Hematocrit 36.0 - 46.0 % 32.4(L) 33.0(L) 32.3(L)  Platelets 150 - 400 K/uL 228 239 260   CMP Latest Ref Rng & Units 12/28/2020 12/24/2020 10/27/2020  Glucose 70 - 99 mg/dL 119(H) 101(H) 146(H)  BUN 8 - 23 mg/dL 27(H) 35(H) 31(H)  Creatinine 0.44 - 1.00 mg/dL 1.50(H) 1.52(H) 1.33(H)  Sodium 135 - 145 mmol/L 137 138 134(L)  Potassium 3.5 - 5.1 mmol/L 3.2(L) 3.7 3.4(L)  Chloride 98 - 111 mmol/L 96(L) 96(L) 97(L)  CO2 22 - 32 mmol/L 31 32 27  Calcium 8.9 - 10.3 mg/dL 9.5 9.7 9.2  Total Protein 6.5 - 8.1 g/dL 6.6 - 7.0  Total Bilirubin 0.3 - 1.2 mg/dL 0.7 - 0.2(L)  Alkaline Phos 38 - 126 U/L 45 - 50  AST 15 - 41 U/L 15 - 14(L)  ALT 0 - 44 U/L 13 - 14    DIAGNOSTIC IMAGING:  I have independently reviewed the scans and discussed with the patient. No results found.   ASSESSMENT:  1.  Relapsed IgG kappa multiple myeloma with high risk features: -5 cycles of carfilzomib, pomalidomide and dexamethasone from 03/05/2019 through 06/25/2019. -Myeloma panel on 07/09/2019 shows M spike 0.3 g, slightly up from 0.2 g previously.  However free light chain ratio has 1.37 and improved.  Kappa light  chains are 24.8. - Carfilzomib discontinued after 08/06/2019 secondary to fluid retention. - She is currently on Pomalyst 4 mg 3 weeks on 1 week off. - PET scan on 03/22/2020 with no evidence of malignancy. - Bone marrow biopsy on 03/19/2020 showed approximately 5% plasma cells.  Chromosome analysis and FISH panel were normal.   2.  Shortness of breath on exertion: -2D echo on 02/09/2019 shows EF 60-65%.  No LVH. -Chest CT PE protocol on 02/24/2019 was negative. -Cardiac MRI on 03/07/2019 shows LVEF 56% with no amyloid.  Troponin T and proBNP were negative. -PFTs show low expiratory reserve volume consistent with body habitus.  Moderate diffusion defect which corrects to normal.  FVC, FEV1, FEV1/FVC ratio are within normal  limits.  FVC is reduced relative to SVC indicates air-trapping.  No significant response after bronchodilators.  Reduced diffusion capacity indicates moderate loss of functional alveolar capillary space. -She was evaluated by Dr. Melvyn Novas. -2D echocardiogram on 08/27/2019 shows LVEF 60 to 65%.  No LVH.   3.  Myeloma bone disease: -Bone density on 07/04/2019 shows T score 1.8.   PLAN:  1.  Relapsed IgG kappa multiple myeloma with high risk features: - Reviewed myeloma panel from 12/28/2020.  M spike was 0.2 g.  Light chain ratio is stable at 1.83, kappa light chains 30.2.  Immunofixation shows IgG kappa. - LFTs and calcium are within normal limits.  CBC shows mild normocytic anemia with hemoglobin 10.3.  We will check ferritin and iron panel at next visit. - Continue Pomalyst 4 mg 3 weeks on/1 week off.  Continue dexamethasone 20 mg weekly. - RTC 2 months for follow-up.   2.  Shortness of breath on exertion: - She used bronchodilators and no improvement in shortness of breath.  She was evaluated by pulmonary Dr. Melvyn Novas. - Dr. Melvyn Novas felt that she might benefit from pulmonology evaluation at [facility].  We will make a referral.   3.  ID prophylaxis: - Continue acyclovir twice daily and aspirin 81 mg for thromboprophylaxis.   4.  Neuropathy: - She reports tingling in the right hand and feet. - Continue gabapentin 300 mg 2-3 times daily.   5.  Lower extremity edema: - Continue Lasix 40 mg twice daily.  Continue metolazone 2.5 mg on Mondays, Wednesday and Friday. - She reports improvement in leg swellings.  Creatinine is stable at 1.5.   6.  Myeloma bone disease: - Zometa held after last infusion on 01/27/2020 due to worsening renal function.  She already received 2 years worth of Zometa on and off.   7.  Hypokalemia: - Potassium today is 3.2.  Continue potassium 2 tablets daily at home.   8.  Bilateral knee pains: - Continue Percocet twice daily as needed.   Orders placed this encounter:   No orders of the defined types were placed in this encounter.    Derek Jack, MD [place] [phone]   I, Thana Ates, am acting as a scribe for Dr. Derek Jack.  I, Derek Jack MD, have reviewed the above documentation for accuracy and completeness, and I agree with the above.

## 2021-01-05 ENCOUNTER — Other Ambulatory Visit: Payer: Self-pay

## 2021-01-05 ENCOUNTER — Inpatient Hospital Stay (HOSPITAL_BASED_OUTPATIENT_CLINIC_OR_DEPARTMENT_OTHER): Payer: Medicare Other | Admitting: Hematology

## 2021-01-05 VITALS — BP 155/59 | HR 80 | Temp 97.5°F | Resp 21 | Wt 355.7 lb

## 2021-01-05 DIAGNOSIS — C9000 Multiple myeloma not having achieved remission: Secondary | ICD-10-CM | POA: Diagnosis not present

## 2021-01-05 NOTE — Progress Notes (Signed)
Patient is taking Pomalyst as prescribed.  She has not missed any doses and reports no side effects at this time.   

## 2021-01-05 NOTE — Patient Instructions (Addendum)
[place] at [facility] Discharge Instructions   You were seen and examined today by Dr. Delton Coombes. He reviewed your lab work which is normal/stable.  Continue Pomalyst as prescribed.  Return as scheduled in 2 months for lab work and office visit.   Thank you for choosing [place] at [facility] to provide your oncology and hematology care.  To afford each patient quality time with our provider, please arrive at least 15 minutes before your scheduled appointment time.   If you have a lab appointment with the [facility] please come in thru the Main Entrance and check in at the main information desk.  You need to re-schedule your appointment should you arrive 10 or more minutes late.  We strive to give you quality time with our providers, and arriving late affects you and other patients whose appointments are after yours.  Also, if you no show three or more times for appointments you may be dismissed from the clinic at the providers discretion.     Again, thank you for choosing [facility].  Our hope is that these requests will decrease the amount of time that you wait before being seen by our physicians.       _____________________________________________________________  Should you have questions after your visit to [facility], please contact our office at [phone] and follow the prompts.  Our office hours are 8:00 a.m. and 4:30 p.m. Monday - Friday.  Please note that voicemails left after 4:00 p.m. may not be returned until the following business day.  We are closed weekends and major holidays.  You do have access to a nurse 24-7, just call the main number to the clinic [phone] and do not press any options, hold on the line and a nurse will answer the phone.    For prescription refill requests, have your pharmacy contact our office and allow 72 hours.    Due to Covid, you will need to wear a mask upon  entering the hospital. If you do not have a mask, a mask will be given to you at the Main Entrance upon arrival. For doctor visits, patients may have 1 support person age 9 or older with them. For treatment visits, patients can not have anyone with them due to social distancing guidelines and our immunocompromised population.

## 2021-01-07 ENCOUNTER — Other Ambulatory Visit (HOSPITAL_COMMUNITY): Payer: Self-pay | Admitting: *Deleted

## 2021-01-07 DIAGNOSIS — C9000 Multiple myeloma not having achieved remission: Secondary | ICD-10-CM

## 2021-01-11 ENCOUNTER — Other Ambulatory Visit (HOSPITAL_COMMUNITY)
Admission: RE | Admit: 2021-01-11 | Discharge: 2021-01-11 | Disposition: A | Discharge status: Home / Self Care | Payer: Medicare Other | Source: Ambulatory Visit | Attending: Internal Medicine | Admitting: Internal Medicine

## 2021-01-11 DIAGNOSIS — Z79899 Other long term (current) drug therapy: Secondary | ICD-10-CM | POA: Insufficient documentation

## 2021-01-11 LAB — BASIC METABOLIC PANEL
Anion gap: 13 (ref 5–15)
BUN: 31 mg/dL — ABNORMAL HIGH (ref 8–23)
CO2: 27 mmol/L (ref 22–32)
Calcium: 9.5 mg/dL (ref 8.9–10.3)
Chloride: 98 mmol/L (ref 98–111)
Creatinine, Ser: 1.59 mg/dL — ABNORMAL HIGH (ref 0.44–1.00)
GFR, Estimated: 36 mL/min — ABNORMAL LOW (ref >60)
Glucose, Bld: 107 mg/dL — ABNORMAL HIGH (ref 70–99)
Potassium: 3.6 mmol/L (ref 3.5–5.1)
Sodium: 138 mmol/L (ref 135–145)

## 2021-01-12 ENCOUNTER — Other Ambulatory Visit (HOSPITAL_COMMUNITY): Payer: Self-pay

## 2021-01-12 MED ORDER — POMALIDOMIDE 4 MG PO CAPS: 4.0000 mg | ORAL_CAPSULE | Freq: Every day | ORAL | 0 refills | Status: DC

## 2021-01-12 NOTE — Telephone Encounter (Signed)
Chart reviewed. Pomalyst refilled per last office note with Dr. Katragadda.  

## 2021-01-14 ENCOUNTER — Telehealth: Payer: Self-pay | Admitting: Internal Medicine

## 2021-01-14 NOTE — Telephone Encounter (Signed)
Werner Lean, MD  Kelli Hurley, CMA Results:  Stable creatinine on new diuretic  Plan:  No change  Send results to [facility]   Werner Lean, MD      Patient given results, copied pcp

## 2021-01-14 NOTE — Telephone Encounter (Signed)
Patient was returning call for results 

## 2021-01-19 ENCOUNTER — Other Ambulatory Visit (HOSPITAL_COMMUNITY): Payer: Self-pay

## 2021-01-19 DIAGNOSIS — G8929 Other chronic pain: Secondary | ICD-10-CM

## 2021-01-19 DIAGNOSIS — M25562 Pain in left knee: Secondary | ICD-10-CM

## 2021-01-19 MED ORDER — OXYCODONE-ACETAMINOPHEN 5-325 MG PO TABS: 1.0000 | ORAL_TABLET | Freq: Two times a day (BID) | ORAL | 0 refills | Status: DC | PRN

## 2021-02-08 ENCOUNTER — Other Ambulatory Visit (HOSPITAL_COMMUNITY): Payer: Self-pay

## 2021-02-08 MED ORDER — POMALIDOMIDE 4 MG PO CAPS: 4.0000 mg | ORAL_CAPSULE | Freq: Every day | ORAL | 0 refills | Status: DC

## 2021-02-08 NOTE — Telephone Encounter (Signed)
Chart reviewed. Pomalyst refilled per last office note with Dr. Katragadda.  

## 2021-02-23 ENCOUNTER — Other Ambulatory Visit (HOSPITAL_COMMUNITY): Payer: Self-pay

## 2021-02-23 DIAGNOSIS — G8929 Other chronic pain: Secondary | ICD-10-CM

## 2021-02-23 DIAGNOSIS — M25562 Pain in left knee: Secondary | ICD-10-CM

## 2021-02-24 ENCOUNTER — Encounter (HOSPITAL_COMMUNITY): Payer: Self-pay | Admitting: Hematology

## 2021-02-24 MED ORDER — OXYCODONE-ACETAMINOPHEN 5-325 MG PO TABS: 1.0000 | ORAL_TABLET | Freq: Two times a day (BID) | ORAL | 0 refills | Status: DC | PRN

## 2021-03-08 ENCOUNTER — Other Ambulatory Visit (HOSPITAL_COMMUNITY): Payer: Self-pay

## 2021-03-08 MED ORDER — POMALIDOMIDE 4 MG PO CAPS: 4.0000 mg | ORAL_CAPSULE | Freq: Every day | ORAL | 0 refills | Status: DC

## 2021-03-08 NOTE — Telephone Encounter (Signed)
Chart reviewed. Pomalyst refilled per last office note with Dr. Katragadda.  

## 2021-03-09 ENCOUNTER — Other Ambulatory Visit: Payer: Self-pay

## 2021-03-09 ENCOUNTER — Inpatient Hospital Stay (HOSPITAL_COMMUNITY): Payer: Medicare Other | Attending: Physician Assistant

## 2021-03-09 DIAGNOSIS — Z7961 Long term (current) use of immunomodulator: Secondary | ICD-10-CM | POA: Insufficient documentation

## 2021-03-09 DIAGNOSIS — G629 Polyneuropathy, unspecified: Secondary | ICD-10-CM | POA: Insufficient documentation

## 2021-03-09 DIAGNOSIS — Z79899 Other long term (current) drug therapy: Secondary | ICD-10-CM | POA: Insufficient documentation

## 2021-03-09 DIAGNOSIS — Z8042 Family history of malignant neoplasm of prostate: Secondary | ICD-10-CM | POA: Insufficient documentation

## 2021-03-09 DIAGNOSIS — D649 Anemia, unspecified: Secondary | ICD-10-CM | POA: Insufficient documentation

## 2021-03-09 DIAGNOSIS — Z809 Family history of malignant neoplasm, unspecified: Secondary | ICD-10-CM | POA: Insufficient documentation

## 2021-03-09 DIAGNOSIS — Z8719 Personal history of other diseases of the digestive system: Secondary | ICD-10-CM | POA: Insufficient documentation

## 2021-03-09 DIAGNOSIS — I10 Essential (primary) hypertension: Secondary | ICD-10-CM | POA: Insufficient documentation

## 2021-03-09 DIAGNOSIS — Z9484 Stem cells transplant status: Secondary | ICD-10-CM | POA: Insufficient documentation

## 2021-03-09 DIAGNOSIS — R200 Anesthesia of skin: Secondary | ICD-10-CM | POA: Diagnosis not present

## 2021-03-09 DIAGNOSIS — C9000 Multiple myeloma not having achieved remission: Secondary | ICD-10-CM | POA: Insufficient documentation

## 2021-03-09 DIAGNOSIS — R0602 Shortness of breath: Secondary | ICD-10-CM | POA: Insufficient documentation

## 2021-03-09 DIAGNOSIS — M7989 Other specified soft tissue disorders: Secondary | ICD-10-CM | POA: Diagnosis not present

## 2021-03-09 DIAGNOSIS — E876 Hypokalemia: Secondary | ICD-10-CM | POA: Insufficient documentation

## 2021-03-09 DIAGNOSIS — M25561 Pain in right knee: Secondary | ICD-10-CM | POA: Insufficient documentation

## 2021-03-09 DIAGNOSIS — Z8249 Family history of ischemic heart disease and other diseases of the circulatory system: Secondary | ICD-10-CM | POA: Insufficient documentation

## 2021-03-09 DIAGNOSIS — Z9481 Bone marrow transplant status: Secondary | ICD-10-CM | POA: Insufficient documentation

## 2021-03-09 DIAGNOSIS — M25562 Pain in left knee: Secondary | ICD-10-CM | POA: Diagnosis not present

## 2021-03-09 LAB — COMPREHENSIVE METABOLIC PANEL
ALT: 15 U/L (ref 0–44)
AST: 13 U/L — ABNORMAL LOW (ref 15–41)
Albumin: 3.8 g/dL (ref 3.5–5.0)
Alkaline Phosphatase: 53 U/L (ref 38–126)
Anion gap: 7 (ref 5–15)
BUN: 45 mg/dL — ABNORMAL HIGH (ref 8–23)
CO2: 29 mmol/L (ref 22–32)
Calcium: 9.4 mg/dL (ref 8.9–10.3)
Chloride: 97 mmol/L — ABNORMAL LOW (ref 98–111)
Creatinine, Ser: 1.85 mg/dL — ABNORMAL HIGH (ref 0.44–1.00)
GFR, Estimated: 30 mL/min — ABNORMAL LOW (ref >60)
Glucose, Bld: 126 mg/dL — ABNORMAL HIGH (ref 70–99)
Potassium: 3.4 mmol/L — ABNORMAL LOW (ref 3.5–5.1)
Sodium: 133 mmol/L — ABNORMAL LOW (ref 135–145)
Total Bilirubin: 0.4 mg/dL (ref 0.3–1.2)
Total Protein: 7.3 g/dL (ref 6.5–8.1)

## 2021-03-09 LAB — IRON AND TIBC
Iron: 67 ug/dL (ref 28–170)
Saturation Ratios: 20 % (ref 10.4–31.8)
TIBC: 330 ug/dL (ref 250–450)
UIBC: 263 ug/dL

## 2021-03-09 LAB — CBC WITH DIFFERENTIAL/PLATELET
Abs Immature Granulocytes: 0.18 10*3/uL — ABNORMAL HIGH (ref 0.00–0.07)
Basophils Absolute: 0 10*3/uL (ref 0.0–0.1)
Basophils Relative: 0 %
Eosinophils Absolute: 0 10*3/uL (ref 0.0–0.5)
Eosinophils Relative: 0 %
HCT: 32.8 % — ABNORMAL LOW (ref 36.0–46.0)
Hemoglobin: 10.8 g/dL — ABNORMAL LOW (ref 12.0–15.0)
Immature Granulocytes: 3 %
Lymphocytes Relative: 14 %
Lymphs Abs: 0.8 10*3/uL (ref 0.7–4.0)
MCH: 31.3 pg (ref 26.0–34.0)
MCHC: 32.9 g/dL (ref 30.0–36.0)
MCV: 95.1 fL (ref 80.0–100.0)
Monocytes Absolute: 0.8 10*3/uL (ref 0.1–1.0)
Monocytes Relative: 13 %
Neutro Abs: 4.1 10*3/uL (ref 1.7–7.7)
Neutrophils Relative %: 70 %
Platelets: 260 10*3/uL (ref 150–400)
RBC: 3.45 MIL/uL — ABNORMAL LOW (ref 3.87–5.11)
RDW: 14.6 % (ref 11.5–15.5)
WBC: 6 10*3/uL (ref 4.0–10.5)
nRBC: 0 % (ref 0.0–0.2)

## 2021-03-09 LAB — LACTATE DEHYDROGENASE: LDH: 155 U/L (ref 98–192)

## 2021-03-09 LAB — MAGNESIUM: Magnesium: 2.4 mg/dL (ref 1.7–2.4)

## 2021-03-09 LAB — VITAMIN B12: Vitamin B-12: 754 pg/mL (ref 180–914)

## 2021-03-09 LAB — FERRITIN: Ferritin: 76 ng/mL (ref 11–307)

## 2021-03-09 LAB — FOLATE: Folate: 44.2 ng/mL (ref >5.9)

## 2021-03-09 MED ORDER — HEPARIN SOD (PORK) LOCK FLUSH 100 UNIT/ML IV SOLN
500.0000 [IU] | Freq: Once | INTRAVENOUS | Status: AC
  Administered 2021-03-09: 500 [IU] via INTRAVENOUS

## 2021-03-09 MED ORDER — SODIUM CHLORIDE 0.9% FLUSH
10.0000 mL | INTRAVENOUS | Status: DC | PRN
  Administered 2021-03-09: 10 mL via INTRAVENOUS

## 2021-03-10 ENCOUNTER — Other Ambulatory Visit (HOSPITAL_COMMUNITY): Payer: Self-pay | Admitting: Hematology

## 2021-03-10 LAB — KAPPA/LAMBDA LIGHT CHAINS
Kappa free light chain: 36 mg/L — ABNORMAL HIGH (ref 3.3–19.4)
Kappa, lambda light chain ratio: 1.81 — ABNORMAL HIGH (ref 0.26–1.65)
Lambda free light chains: 19.9 mg/L (ref 5.7–26.3)

## 2021-03-11 LAB — PROTEIN ELECTROPHORESIS, SERUM
A/G Ratio: 1 (ref 0.7–1.7)
Albumin ELP: 3.3 g/dL (ref 2.9–4.4)
Alpha-1-Globulin: 0.3 g/dL (ref 0.0–0.4)
Alpha-2-Globulin: 0.9 g/dL (ref 0.4–1.0)
Beta Globulin: 1.1 g/dL (ref 0.7–1.3)
Gamma Globulin: 1 g/dL (ref 0.4–1.8)
Globulin, Total: 3.3 g/dL (ref 2.2–3.9)
M-Spike, %: 0.3 g/dL — ABNORMAL HIGH
Total Protein ELP: 6.6 g/dL (ref 6.0–8.5)

## 2021-03-11 LAB — IMMUNOFIXATION ELECTROPHORESIS
IgA: 200 mg/dL (ref 87–352)
IgA: 202 mg/dL (ref 87–352)
IgG (Immunoglobin G), Serum: 832 mg/dL (ref 586–1602)
IgG (Immunoglobin G), Serum: 832 mg/dL (ref 586–1602)
IgM (Immunoglobulin M), Srm: 65 mg/dL (ref 26–217)
IgM (Immunoglobulin M), Srm: 72 mg/dL (ref 26–217)
Total Protein ELP: 6.6 g/dL (ref 6.0–8.5)
Total Protein ELP: 6.9 g/dL (ref 6.0–8.5)

## 2021-03-16 ENCOUNTER — Inpatient Hospital Stay (HOSPITAL_BASED_OUTPATIENT_CLINIC_OR_DEPARTMENT_OTHER): Payer: Medicare Other | Admitting: Hematology

## 2021-03-16 ENCOUNTER — Other Ambulatory Visit (HOSPITAL_COMMUNITY): Payer: Self-pay | Admitting: *Deleted

## 2021-03-16 ENCOUNTER — Other Ambulatory Visit: Payer: Self-pay

## 2021-03-16 VITALS — BP 142/80 | HR 71 | Temp 98.0°F | Resp 18 | Ht 63.0 in | Wt 350.3 lb

## 2021-03-16 DIAGNOSIS — N183 Chronic kidney disease, stage 3 unspecified: Secondary | ICD-10-CM

## 2021-03-16 DIAGNOSIS — G8929 Other chronic pain: Secondary | ICD-10-CM

## 2021-03-16 DIAGNOSIS — C9000 Multiple myeloma not having achieved remission: Secondary | ICD-10-CM | POA: Diagnosis not present

## 2021-03-16 DIAGNOSIS — M25562 Pain in left knee: Secondary | ICD-10-CM

## 2021-03-16 MED ORDER — GABAPENTIN 300 MG PO CAPS: 300.0000 mg | ORAL_CAPSULE | Freq: Three times a day (TID) | ORAL | 5 refills | Status: DC

## 2021-03-16 MED ORDER — MISC. DEVICES MISC: 0 refills | Status: DC

## 2021-03-16 MED ORDER — OXYCODONE-ACETAMINOPHEN 5-325 MG PO TABS: 1.0000 | ORAL_TABLET | Freq: Two times a day (BID) | ORAL | 0 refills | Status: DC | PRN

## 2021-03-16 NOTE — Progress Notes (Signed)
Patient is taking Pomalyst as prescribed.  She has not missed any doses and reports no side effects at this time.   

## 2021-03-16 NOTE — Progress Notes (Signed)
[place] [address] [place] [address]   CLINIC:  Medical Oncology/Hematology  PCP:  Alfonse Flavors, MD [address] [place] [address] [phone]   REASON FOR VISIT:  Follow-up for multiple myeloma  PRIOR THERAPY:  1. Bortezomib x 8 cycles from 10/05/2014 to 04/06/2016. 2. Carfilzomib x 6 cycles from 03/05/2019 to 08/06/2019.  NGS Results: not done  CURRENT THERAPY: Pomalyst 4 mg 3/4 weeks  BRIEF ONCOLOGIC HISTORY:  Oncology History  Multiple myeloma ([place])  08/07/2014 Imaging   Bone Survey- No lytic lesions are noted in the visualized skeleton.   09/03/2014 Bone Marrow Biopsy   NORMOCELLULAR BONE MARROW FOR AGE WITH PLASMA CELL NEOPLASM.  The plasma cell component is increased in the marrow representing an estimated 18% of all cells. Cytogenetics with 13q-, 17p- (high risk disease)   09/03/2014 Pathology Results   Cytogenetics with 13q-, 17p- (high risk disease)   09/23/2014 Initial Diagnosis   Multiple myeloma   09/30/2014 PET scan   No abnormal hypermetabolism in the neck, chest, abdomen or pelvis.   10/05/2014 - 03/22/2015 Chemotherapy   RVD   10/28/2014 Treatment Plan Change   Issues related to getting Revlimid in a timely fashion, therefore, she received her Revlimid on 9/21 resulting in a 12 day cycle this time instead of a 14 day cycle   11/02/2014 Imaging   CTA chest- No evidence for a large or central pulmonary embolism as described.  8 mm density along the right minor fissure could represent focal pleural thickening but indeterminate. If the patient is at high risk for bronchogenic carcinoma, follow-up c   11/23/2014 Miscellaneous   Zometa 4 mg IV monthly   04/07/2015 Bone Marrow Biopsy   Normocellular marrow with 2-4% clonal plasma cells by immunohistochemistry. FISH and cytogenetics were normal [facility])     04/2015 Miscellaneous   PRETRANSPLANT EVALUATION:  Pulmonary function tests: FEV1 100.3% / DLCO 97.9%   Echocardiogram: Normal LV function with EF 60-65%    04/27/2015 Procedure   Stem cell mobilization with filgrastim and Mozobil [facility])   05/06/2015 Miscellaneous   BMT conditioning regimen with high-dose Melphalan given [facility], [name]); Day -1   05/07/2015 Bone Marrow Transplant   Outpatient autologous stem cell transplant [facility], [name]); Day 0   05/18/2015 Miscellaneous   WBC engraftment;  did not require platelet transfusion during her transplant process. Lowest platelet count 28,000    05/20/2015 Procedure   Tunneled catheter removed [facility])   09/08/2015 -  Chemotherapy   Velcade every 2 weeks   12/15/2015 Miscellaneous   Zometa re-instituted.    12/23/2015 Imaging   Bone density- BMD as determined from Forearm Radius 33% is 0.799 g/cm2 with a T-Score of 1.2. This patient is considered normal according to [facility]) criteria.   04/17/2016 Miscellaneous   Started maintenance with Ninlaro.   05/04/2016 Treatment Plan Change   Zometa switched to Xgeva injection monthly given difficult IV access.    03/05/2019 - 08/06/2019 Chemotherapy   The patient had carfilzomib (KYPROLIS) 40 mg in dextrose 5 % 50 mL chemo infusion, 18 mg/m2 = 44 mg, Intravenous,  Once, 6 of 6 cycles Dose modification: 56 mg/m2 (original dose 56 mg/m2, Cycle 1, Reason: Provider Judgment) Administration: 40 mg (03/05/2019), 120 mg (03/12/2019), 120 mg (03/20/2019), 120 mg (04/02/2019), 120 mg (04/09/2019), 120 mg (04/16/2019), 120 mg (04/30/2019), 120 mg (05/08/2019), 120 mg (05/14/2019), 120 mg (05/28/2019), 120 mg (06/04/2019), 120 mg (06/11/2019), 120 mg (  06/25/2019), 120 mg (07/02/2019), 120 mg (07/09/2019), 120 mg (07/23/2019), 120 mg (07/30/2019), 120 mg (08/06/2019)   for chemotherapy treatment.       CANCER STAGING:  Cancer Staging  Multiple myeloma [facility]) Staging form: Multiple Myeloma, AJCC 6th Edition - Clinical stage from 10/26/2014: Stage IIA - Signed by Baird Cancer, PA-C on  10/26/2014 - Pathologic: No stage assigned - Unsigned   INTERVAL HISTORY:  [name], a 66 y.o. female, returns for routine follow-up of her multiple myeloma. Fartun was last seen on 01/05/2021.   Today she reports feeling good. Her neuropathy in her feet has worsened and has not been helped by Gabapentin BID. She reports occasional ankle and leg swellings. She is taking Lasix BID. She denies falls. She reports pain in her knees which is helped by Oxycodone. She reports continued SOB.   REVIEW OF SYSTEMS:  Review of Systems  Constitutional:  Negative for appetite change and fatigue.  Respiratory:  Positive for shortness of breath.   Cardiovascular:  Positive for leg swelling.  Musculoskeletal:  Positive for arthralgias (6/10 knees).  Neurological:  Positive for numbness.  All other systems reviewed and are negative.  PAST MEDICAL/SURGICAL HISTORY:  Past Medical History:  Diagnosis Date   Anemia    Arthritis    Cancer ([place])    Claustrophobia 10/05/2014   Hypertension    Leukopenia 08/03/2014   Normocytic hypochromic anemia 08/03/2014   Renal disorder    stage 3    Past Surgical History:  Procedure Laterality Date   ABDOMINAL HYSTERECTOMY     CARDIAC SURGERY     55 months old. States she had a leaky valve.    COLONOSCOPY  03/26/2009   Dr. Oneida Alar; normal colon, small internal hemorrhoids.  Recommended repeat colonoscopy in 10 years.   COLONOSCOPY WITH PROPOFOL N/A 11/25/2019   Procedure: COLONOSCOPY WITH PROPOFOL;  Surgeon: Eloise Harman, DO;  Location: [facility];  Service: Endoscopy;  Laterality: N/A;  10:45am   OTHER SURGICAL HISTORY     heart surgery as infant to "repair hole in heart"   PORTACATH PLACEMENT Left 02/21/2019   Procedure: INSERTION PORT-A-CATH;  Surgeon: Virl Cagey, MD;  Location: [facility] ORS;  Service: General;  Laterality: Left;    SOCIAL HISTORY:  Social History   Socioeconomic History   Marital status: Legally Separated    Spouse  name: Not on file   Number of children: Not on file   Years of education: Not on file   Highest education level: Not on file  Occupational History   Not on file  Tobacco Use   Smoking status: Never   Smokeless tobacco: Never  Vaping Use   Vaping Use: Never used  Substance and Sexual Activity   Alcohol use: No   Drug use: No   Sexual activity: Not on file    Comment: divorced- 2 daughters  Other Topics Concern   Not on file  Social History Narrative   Not on file   Social Determinants of Health   Financial Resource Strain: Low Risk    Difficulty of Paying Living Expenses: Not hard at all  Food Insecurity: No Food Insecurity   Worried About Charity fundraiser in the Last Year: Never true   Ran Out of Food in the Last Year: Never true  Transportation Needs: No Transportation Needs   Lack of Transportation (Medical): No   Lack of Transportation (Non-Medical): No  Physical Activity: Inactive   Days of Exercise per  Week: 0 days   Minutes of Exercise per Session: 0 min  Stress: No Stress Concern Present   Feeling of Stress : Not at all  Social Connections: Unknown   Frequency of Communication with Friends and Family: More than three times a week   Frequency of Social Gatherings with Friends and Family: Once a week   Attends Religious Services: More than 4 times per year   Active Member of [employer] or Organizations: No   Attends Music therapist: Never   Marital Status: Not on file  Intimate Partner Violence: Not At Risk   Fear of Current or Ex-Partner: No   Emotionally Abused: No   Physically Abused: No   Sexually Abused: No    FAMILY HISTORY:  Family History  Problem Relation Age of Onset   Cancer Mother    Hypertension Mother    Cancer Father    Hypertension Father    Cancer Maternal Grandmother    Hypertension Sister    Hypertension Brother    Prostate cancer Brother    Colon cancer Neg Hx    Colon polyps Neg Hx     CURRENT MEDICATIONS:   Current Outpatient Medications  Medication Sig Dispense Refill   acetaminophen (TYLENOL) 650 MG CR tablet Take 650 mg by mouth every 8 (eight) hours as needed for pain. (Patient not taking: Reported on 01/05/2021)     acyclovir (ZOVIRAX) 400 MG tablet TAKE 1 TABLET BY MOUTH TWICE DAILY 60 tablet 6   albuterol (PROAIR HFA) 108 (90 Base) MCG/ACT inhaler 2 puffs as needed     albuterol (VENTOLIN HFA) 108 (90 Base) MCG/ACT inhaler Inhale 2 puffs into the lungs every 6 (six) hours as needed for wheezing or shortness of breath. (Patient not taking: Reported on 01/05/2021) 8 g 3   ALPRAZolam (XANAX) 1 MG tablet Take 1 mg by mouth daily as needed. (Patient not taking: Reported on 01/05/2021)     Ascorbic Acid (VITAMIN C) 1000 MG tablet Take 1,000 mg by mouth daily.      aspirin EC 81 MG tablet Take 81 mg by mouth daily.     calcitRIOL (ROCALTROL) 0.25 MCG capsule Take by mouth.     calcium carbonate (OS-CAL) 1250 (500 Ca) MG chewable tablet Chew 1 tablet by mouth daily.      cholecalciferol (VITAMIN D) 1000 units tablet Take 1,000 Units by mouth daily.     dapagliflozin propanediol (FARXIGA) 10 MG TABS tablet Take 1 tablet (10 mg total) by mouth daily before breakfast. 30 tablet 6   dexamethasone (DECADRON) 4 MG tablet Take 5 tablets once a week 30 tablet 6   Elastic Bandages & Supports (MEDICAL COMPRESSION STOCKINGS) MISC See admin instructions.     Elastic Bandages & Supports (WRIST SPLINT) MISC See admin instructions.     furosemide (LASIX) 20 MG tablet Take 2 tablets (40 mg total) by mouth in the morning AND 2 tablets (40 mg total) at bedtime. 180 tablet 3   gabapentin (NEURONTIN) 300 MG capsule Take 1 capsule (300 mg total) by mouth 3 (three) times daily. 90 capsule 5   lidocaine-prilocaine (EMLA) cream APPLY A QUATER SIZED 1 HOUR PRIOR TO TREATMENT 30 g 0   losartan (COZAAR) 25 MG tablet Take 12.5 mg by mouth daily.     magnesium oxide (MAG-OX) 400 (240 Mg) MG tablet TAKE 1 TABLET BY MOUTH TWICE  DAILY 60 tablet 6   metolazone (ZAROXOLYN) 2.5 MG tablet TAKE 1 TABLET BY MOUTH ONCE DAILY. 30 tablet  6   Multiple Vitamin (TAB-A-VITE) TABS TAKE 1 TABLET BY MOUTH ONCE DAILY. (Patient taking differently: Take 1 tablet by mouth daily.) 30 tablet 11   oxyCODONE-acetaminophen (PERCOCET/ROXICET) 5-325 MG tablet Take 1-2 tablets by mouth every 12 (twelve) hours as needed for severe pain. 60 tablet 0   pomalidomide (POMALYST) 4 MG capsule Take 1 capsule (4 mg total) by mouth daily. 21 days on, 7 days off 21 capsule 0   potassium chloride SA (KLOR-CON) 20 MEQ tablet TAKE (1) TABLET BY MOUTH THREE TIMES DAILY 90 tablet 6   trolamine salicylate (ASPERCREME) 10 % cream Apply 1 application topically 2 (two) times daily as needed for muscle pain. (Patient not taking: Reported on 01/05/2021)     No current facility-administered medications for this visit.   Facility-Administered Medications Ordered in Other Visits  Medication Dose Route Frequency Provider Last Rate Last Admin   heparin lock flush 100 unit/mL  500 Units Intravenous Once Penland, Kelby Fam, MD       sodium chloride 0.9 % injection 10 mL  10 mL Intravenous Once Penland, Kelby Fam, MD        ALLERGIES:  Allergies  Allergen Reactions   Diclofenac Swelling    Per pt, facial swelling    PHYSICAL EXAM:  Performance status (ECOG): 1 - Symptomatic but completely ambulatory  Vitals:   03/16/21 1153  BP: (!) 142/80  Pulse: 71  Resp: 18  Temp: 98 F (36.7 C)  SpO2: 96%   Wt Readings from Last 3 Encounters:  03/16/21 (!) 350 lb 4.8 oz (158.9 kg)  01/05/21 (!) 355 lb 11.2 oz (161.3 kg)  12/09/20 (!) 353 lb (160.1 kg)   Physical Exam Vitals reviewed.  Constitutional:      Appearance: Normal appearance. She is obese.  Cardiovascular:     Rate and Rhythm: Normal rate and regular rhythm.     Pulses: Normal pulses.     Heart sounds: Normal heart sounds.  Pulmonary:     Effort: Pulmonary effort is normal.     Breath sounds: Normal  breath sounds.  Neurological:     General: No focal deficit present.     Mental Status: She is alert and oriented to person, place, and time.  Psychiatric:        Mood and Affect: Mood normal.        Behavior: Behavior normal.     LABORATORY DATA:  I have reviewed the labs as listed.  CBC Latest Ref Rng & Units 03/09/2021 12/28/2020 10/27/2020  WBC 4.0 - 10.5 K/uL 6.0 3.9(L) 4.1  Hemoglobin 12.0 - 15.0 g/dL 10.8(L) 10.3(L) 10.5(L)  Hematocrit 36.0 - 46.0 % 32.8(L) 32.4(L) 33.0(L)  Platelets 150 - 400 K/uL 260 228 239   CMP Latest Ref Rng & Units 03/09/2021 01/11/2021 12/28/2020  Glucose 70 - 99 mg/dL 126(H) 107(H) 119(H)  BUN 8 - 23 mg/dL 45(H) 31(H) 27(H)  Creatinine 0.44 - 1.00 mg/dL 1.85(H) 1.59(H) 1.50(H)  Sodium 135 - 145 mmol/L 133(L) 138 137  Potassium 3.5 - 5.1 mmol/L 3.4(L) 3.6 3.2(L)  Chloride 98 - 111 mmol/L 97(L) 98 96(L)  CO2 22 - 32 mmol/L 29 27 31   Calcium 8.9 - 10.3 mg/dL 9.4 9.5 9.5  Total Protein 6.5 - 8.1 g/dL 7.3 - 6.6  Total Bilirubin 0.3 - 1.2 mg/dL 0.4 - 0.7  Alkaline Phos 38 - 126 U/L 53 - 45  AST 15 - 41 U/L 13(L) - 15  ALT 0 - 44 U/L 15 - 13  DIAGNOSTIC IMAGING:  I have independently reviewed the scans and discussed with the patient. No results found.   ASSESSMENT:  1.  Relapsed IgG kappa multiple myeloma with high risk features: -5 cycles of carfilzomib, pomalidomide and dexamethasone from 03/05/2019 through 06/25/2019. -Myeloma panel on 07/09/2019 shows M spike 0.3 g, slightly up from 0.2 g previously.  However free light chain ratio has 1.37 and improved.  Kappa light chains are 24.8. - Carfilzomib discontinued after 08/06/2019 secondary to fluid retention. - She is currently on Pomalyst 4 mg 3 weeks on 1 week off. - PET scan on 03/22/2020 with no evidence of malignancy. - Bone marrow biopsy on 03/19/2020 showed approximately 5% plasma cells.  Chromosome analysis and FISH panel were normal.   2.  Shortness of breath on exertion: -2D echo on 02/09/2019  shows EF 60-65%.  No LVH. -Chest CT PE protocol on 02/24/2019 was negative. -Cardiac MRI on 03/07/2019 shows LVEF 56% with no amyloid.  Troponin T and proBNP were negative. -PFTs show low expiratory reserve volume consistent with body habitus.  Moderate diffusion defect which corrects to normal.  FVC, FEV1, FEV1/FVC ratio are within normal limits.  FVC is reduced relative to SVC indicates air-trapping.  No significant response after bronchodilators.  Reduced diffusion capacity indicates moderate loss of functional alveolar capillary space. -She was evaluated by Dr. Melvyn Novas. -2D echocardiogram on 08/27/2019 shows LVEF 60 to 65%.  No LVH.   3.  Myeloma bone disease: -Bone density on 07/04/2019 shows T score 1.8.   PLAN:  1.  Relapsed IgG kappa multiple myeloma with high risk features: - Reviewed myeloma panel from 03/09/2021.  M spike is 0.3 g, up from 0.2 g.  Free light chain ratio is stable around 1.81.  Kappa light chains of 36, up from 30. - Rest of the blood work shows normal CBC with mild anemia with hemoglobin 10.8.  LFTs were normal. - She is on single agent Pomalyst 4 mg 3 weeks on/1 week of along with weekly dexamethasone. - As her M spike and kappa light chains have slightly increased, I have recommended continuing same dose despite slight worsening of her neuropathy.  We will increase gabapentin to 3 times daily. - We will see her in 3 months for follow-up with repeat labs.   2.  Shortness of breath on exertion: - She used bronchodilators with no improvement in shortness of breath.  She was evaluated by Dr. Melvyn Novas.  Dr. Melvyn Novas felt she might benefit from pulmonary evaluation at [facility].  She reports that she has an appointment to see lung doctor in [place].   3.  ID prophylaxis: - Continue acyclovir twice daily and aspirin 81 mg daily for thromboprophylaxis.   4.  Neuropathy: - She reports slight worsening of her neuropathy in the hands and feet. - Will increase gabapentin to 300 mg 3  times daily.   5.  Lower extremity edema: - Continue Lasix 40 mg twice daily. - Continue metolazone 2.5 mg Monday, Wednesday and Friday. - Creatinine increased to 1.85 today.  Continue follow-up with Dr. Theador Hawthorne.   6.  Myeloma bone disease: - Zometa held after last infusion on 01/27/2020 due to worsening renal function.  She already received 2 years worth of Zometa.   7.  Hypokalemia: - Continue potassium 2 tablets daily at home.  Potassium today is 3.4.   8.  Bilateral knee pains: - Continue Percocet 5/325 mg twice daily as needed.   Orders placed this encounter:  No orders of the  defined types were placed in this encounter.    Derek Jack, MD [place] [phone]   I, Thana Ates, am acting as a scribe for Dr. Derek Jack.  I, Derek Jack MD, have reviewed the above documentation for accuracy and completeness, and I agree with the above.

## 2021-03-16 NOTE — Patient Instructions (Addendum)
[place] at [facility] Discharge Instructions   You were seen and examined today by Dr. Delton Coombes.  He reviewed your lab work which is normal/stable. Your kidney function has gone up slightly from your baseline.  Continue to follow Dr. Theador Hawthorne for this.   We will increase your gabapentin to three times a day.  Continue Pomalyst as prescribed.   Return as scheduled in 3 months.    Thank you for choosing [place] at [facility] to provide your oncology and hematology care.  To afford each patient quality time with our provider, please arrive at least 15 minutes before your scheduled appointment time.   If you have a lab appointment with the [place] please come in thru the Main Entrance and check in at the main information desk.  You need to re-schedule your appointment should you arrive 10 or more minutes late.  We strive to give you quality time with our providers, and arriving late affects you and other patients whose appointments are after yours.  Also, if you no show three or more times for appointments you may be dismissed from the clinic at the providers discretion.     Again, thank you for choosing [facility].  Our hope is that these requests will decrease the amount of time that you wait before being seen by our physicians.       _____________________________________________________________  Should you have questions after your visit to [facility], please contact our office at [phone] and follow the prompts.  Our office hours are 8:00 a.m. and 4:30 p.m. Monday - Friday.  Please note that voicemails left after 4:00 p.m. may not be returned until the following business day.  We are closed weekends and major holidays.  You do have access to a nurse 24-7, just call the main number to the clinic [phone] and do not press any options, hold on the line and a nurse will answer the phone.    For  prescription refill requests, have your pharmacy contact our office and allow 72 hours.    Due to Covid, you will need to wear a mask upon entering the hospital. If you do not have a mask, a mask will be given to you at the Main Entrance upon arrival. For doctor visits, patients may have 1 support person age 43 or older with them. For treatment visits, patients can not have anyone with them due to social distancing guidelines and our immunocompromised population.

## 2021-04-06 NOTE — Progress Notes (Signed)
Cardiology Office Note:    Date:  04/07/2021   ID:  Kelli Hurley, DOB December 27, 1955, MRN [id number]  PCP:  Alfonse Flavors, MD   [facility] Providers Cardiologist:  Werner Lean, MD     Referring MD: Alfonse Flavors,*   CC: Shortness of breath  History of Present Illness:    Kelli Hurley is a 66 y.o. female with a hx of HTN, morbid obesity, OSA on nocturnal O2.  MM with prior bortezemib, caflixomib and lenalidomide; CKD Stage IIIb who presents for evaluation 12/09/20.  Patient notes that she is starting the new medication she feels better and the fluid is coming off.   Notes fluctuates and is about the same. There are no interval hospital/ED visit.    No chest pain or pressure .  Still SOB; worse with DOE (now no long resting SOB) and no PND/Orthopnea.  No weight gain- leg swelling has improved but is persistent.  No palpitations or syncope .  No history pancreatitis, no history gallstones, no MEN2 or medullary carcinoma.  Past Medical History:  Diagnosis Date   Anemia    Arthritis    Cancer ([place])    Claustrophobia 10/05/2014   Hypertension    Leukopenia 08/03/2014   Normocytic hypochromic anemia 08/03/2014   Renal disorder    stage 3     Past Surgical History:  Procedure Laterality Date   ABDOMINAL HYSTERECTOMY     CARDIAC SURGERY     38 months old. States she had a leaky valve.    COLONOSCOPY  03/26/2009   Dr. Oneida Alar; normal colon, small internal hemorrhoids.  Recommended repeat colonoscopy in 10 years.   COLONOSCOPY WITH PROPOFOL N/A 11/25/2019   Procedure: COLONOSCOPY WITH PROPOFOL;  Surgeon: Eloise Harman, DO;  Location: [facility];  Service: Endoscopy;  Laterality: N/A;  10:45am   OTHER SURGICAL HISTORY     heart surgery as infant to "repair hole in heart"   PORTACATH PLACEMENT Left 02/21/2019   Procedure: INSERTION PORT-A-CATH;  Surgeon: Virl Cagey, MD;  Location: [facility] ORS;  Service: General;  Laterality: Left;     Current Medications: Current Meds  Medication Sig   acetaminophen (TYLENOL) 650 MG CR tablet Take 650 mg by mouth every 8 (eight) hours as needed for pain.   acyclovir (ZOVIRAX) 400 MG tablet TAKE 1 TABLET BY MOUTH TWICE DAILY   albuterol (VENTOLIN HFA) 108 (90 Base) MCG/ACT inhaler Inhale 2 puffs into the lungs every 6 (six) hours as needed for wheezing or shortness of breath.   albuterol (VENTOLIN HFA) 108 (90 Base) MCG/ACT inhaler 2 puffs as needed   ALPRAZolam (XANAX) 1 MG tablet Take 1 mg by mouth daily as needed.   Ascorbic Acid (VITAMIN C) 1000 MG tablet Take 1,000 mg by mouth daily.    aspirin EC 81 MG tablet Take 81 mg by mouth daily.   calcitRIOL (ROCALTROL) 0.25 MCG capsule Take by mouth.   calcium carbonate (OS-CAL) 1250 (500 Ca) MG chewable tablet Chew 1 tablet by mouth daily.    cholecalciferol (VITAMIN D) 1000 units tablet Take 1,000 Units by mouth daily.   dapagliflozin propanediol (FARXIGA) 10 MG TABS tablet Take 1 tablet (10 mg total) by mouth daily before breakfast.   dexamethasone (DECADRON) 4 MG tablet Take 5 tablets once a week   Elastic Bandages & Supports (MEDICAL COMPRESSION STOCKINGS) MISC See admin instructions.   Elastic Bandages & Supports (WRIST SPLINT) MISC See admin instructions.   furosemide (LASIX) 20 MG  tablet Take 2 tablets (40 mg total) by mouth in the morning AND 2 tablets (40 mg total) at bedtime.   gabapentin (NEURONTIN) 300 MG capsule Take 1 capsule (300 mg total) by mouth 3 (three) times daily.   lidocaine-prilocaine (EMLA) cream APPLY A QUATER SIZED 1 HOUR PRIOR TO TREATMENT   losartan (COZAAR) 25 MG tablet Take 12.5 mg by mouth daily.   magnesium oxide (MAG-OX) 400 (240 Mg) MG tablet TAKE 1 TABLET BY MOUTH TWICE DAILY   metolazone (ZAROXOLYN) 2.5 MG tablet TAKE 1 TABLET BY MOUTH ONCE DAILY.   Misc. Devices MISC Please provide Rolater (extra large) walker   Multiple Vitamin (TAB-A-VITE) TABS TAKE 1 TABLET BY MOUTH ONCE DAILY. (Patient taking  differently: Take 1 tablet by mouth daily.)   oxyCODONE-acetaminophen (PERCOCET/ROXICET) 5-325 MG tablet Take 1-2 tablets by mouth every 12 (twelve) hours as needed for severe pain.   pomalidomide (POMALYST) 4 MG capsule Take 1 capsule (4 mg total) by mouth daily. 21 days on, 7 days off   potassium chloride SA (KLOR-CON) 20 MEQ tablet TAKE (1) TABLET BY MOUTH THREE TIMES DAILY   spironolactone (ALDACTONE) 25 MG tablet Take 0.5 tablets (12.5 mg total) by mouth daily.   trolamine salicylate (ASPERCREME) 10 % cream Apply 1 application topically 2 (two) times daily as needed for muscle pain.     Allergies:   Diclofenac   Social History   Socioeconomic History   Marital status: Legally Separated    Spouse name: Not on file   Number of children: Not on file   Years of education: Not on file   Highest education level: Not on file  Occupational History   Not on file  Tobacco Use   Smoking status: Never   Smokeless tobacco: Never  Vaping Use   Vaping Use: Never used  Substance and Sexual Activity   Alcohol use: No   Drug use: No   Sexual activity: Not on file    Comment: divorced- 2 daughters  Other Topics Concern   Not on file  Social History Narrative   Not on file   Social Determinants of Health   Financial Resource Strain: Low Risk    Difficulty of Paying Living Expenses: Not hard at all  Food Insecurity: No Food Insecurity   Worried About Charity fundraiser in the Last Year: Never true   [place] in the Last Year: Never true  Transportation Needs: No Transportation Needs   Lack of Transportation (Medical): No   Lack of Transportation (Non-Medical): No  Physical Activity: Inactive   Days of Exercise per Week: 0 days   Minutes of Exercise per Session: 0 min  Stress: No Stress Concern Present   Feeling of Stress : Not at all  Social Connections: Unknown   Frequency of Communication with Friends and Family: More than three times a week   Frequency of Social  Gatherings with Friends and Family: Once a week   Attends Religious Services: More than 4 times per year   Active Member of [employer] or Organizations: No   Attends Music therapist: Never   Marital Status: Not on file     Family History: The patient's family history includes Cancer in her father, maternal grandmother, and mother; Hypertension in her brother, father, mother, and sister; Prostate cancer in her brother. There is no history of Colon cancer or Colon polyps.  ROS:   Please see the history of present illness.     All other  systems reviewed and are negative.  EKGs/Labs/Other Studies Reviewed:    The following studies were reviewed today:  EKG:   12/08/20: Sinus rhythm rate 79 RAD  Transthoracic Echocardiogram: Date: 10/29/20 Results:  1. Left ventricular ejection fraction, by estimation, is 60 to 65%. The  left ventricle has normal function. The left ventricle has no regional  wall motion abnormalities. There is mild left ventricular hypertrophy.  Left ventricular diastolic parameters  are indeterminate.   2. Right ventricular systolic function is normal. The right ventricular  size is normal. Tricuspid regurgitation signal is inadequate for assessing  PA pressure.   3. The mitral valve is normal in structure. No evidence of mitral valve  regurgitation. No evidence of mitral stenosis.   4. The aortic valve has an indeterminant number of cusps. Aortic valve  regurgitation is not visualized. No aortic stenosis is present.   5. The inferior vena cava is normal in size with greater than 50%  respiratory variability, suggesting right atrial pressure of 3 mmHg.   CTPE: Date:02/14/19 Results: Port No CAC  CMR: Date: 03/07/19 Results: IMPRESSION: 1. Normal cardiac MRI   2.  No evidence of cardiac amyloidosis   3.  Normal LV size and function EF 56%   4. No hyper-enhancement of LV myocardium on delayed inversion recovery sequences   5.  Normal RV size  and function     Recent Labs: 12/24/2020: B Natriuretic Peptide 64.0 03/09/2021: ALT 15; BUN 45; Creatinine, Ser 1.85; Hemoglobin 10.8; Magnesium 2.4; Platelets 260; Potassium 3.4; Sodium 133  Recent Lipid Panel No results found for: CHOL, TRIG, HDL, CHOLHDL, VLDL, LDLCALC, LDLDIRECT       Physical Exam:    VS:  BP 128/76    Pulse 75    Ht 5' 2"  (1.575 m)    Wt (!) 350 lb 6.4 oz (158.9 kg)    SpO2 97%    BMI 64.09 kg/m     Wt Readings from Last 3 Encounters:  04/07/21 (!) 350 lb 6.4 oz (158.9 kg)  03/16/21 (!) 350 lb 4.8 oz (158.9 kg)  01/05/21 (!) 355 lb 11.2 oz (161.3 kg)     GEN: Super morbidly obese well developed in no acute distress HEENT: Normal CARDIAC: RRR, no murmurs, rubs, gallops, distant heart sounds RESPIRATORY:  Clear to auscultation without rales, wheezing or rhonchi  ABDOMEN: Soft, non-tender, non-distended MUSCULOSKELETAL:  +2 edema bilaterally; No deformity  SKIN: Warm and dry NEUROLOGIC:  Alert and oriented x 3 PSYCHIATRIC:  Normal affect   ASSESSMENT:    1. Sleep apnea, unspecified type   2. Heart failure with preserved ejection fraction, unspecified HF chronicity ([place])   3. Morbid (severe) obesity due to excess calories ([place])   4. Multiple myeloma, remission status unspecified ([facility])     PLAN:    Multiple Myeloma on steroids Super Morbid Obesity and OHS CKD stage IIIa OSA on nocturnal O2 HFpEF Multi-factorial SOB from above - NYHA class III, Stage C, hypervolemic but weights have improved  - continue SGLT2i - aldactone 12.5 mg PO daily and BMP and BNP in two weeks we will send the notes to [place] (has visit April 4th for labs then April 12th)  - will continue lasix 40 mg PO daily and 20 mg PO nightly, baseline creatinine ~ 1.5 - STOP BANG 6; will offer home sleep study or pulm referral as available  Will plan for 6 month follow up with APP or me unless new symptoms or abnormal test results warranting change  in plan     Medication  Adjustments/Labs and Tests Ordered: Current medicines are reviewed at length with the patient today.  Concerns regarding medicines are outlined above.  Orders Placed This Encounter  Procedures   Basic metabolic panel   B Nat Peptide   Ambulatory referral to Pulmonology   Meds ordered this encounter  Medications   spironolactone (ALDACTONE) 25 MG tablet    Sig: Take 0.5 tablets (12.5 mg total) by mouth daily.    Dispense:  45 tablet    Refill:  3     Patient Instructions  Medication Instructions:  Start Aldactone 12.5 mg tablets by mouth daily  Labwork: In 2 weeks: BNP BMP  Follow-Up: Follow up with Dr. Gasper Sells or APP in 6 months.   Any Other Special Instructions Will Be Listed Below (If Applicable).  You have been referred to Pulmonology. They will contact your with your first appointment.    If you need a refill on your cardiac medications before your next appointment, please call your pharmacy.    Signed, Werner Lean, MD  04/07/2021 1:14 PM    [place]

## 2021-04-07 ENCOUNTER — Encounter: Payer: Self-pay | Admitting: Internal Medicine

## 2021-04-07 ENCOUNTER — Ambulatory Visit (INDEPENDENT_AMBULATORY_CARE_PROVIDER_SITE_OTHER): Payer: Medicare Other | Admitting: Internal Medicine

## 2021-04-07 ENCOUNTER — Other Ambulatory Visit (HOSPITAL_COMMUNITY): Payer: Self-pay

## 2021-04-07 ENCOUNTER — Other Ambulatory Visit: Payer: Self-pay

## 2021-04-07 VITALS — BP 128/76 | HR 75 | Ht 62.0 in | Wt 350.4 lb

## 2021-04-07 DIAGNOSIS — C9000 Multiple myeloma not having achieved remission: Secondary | ICD-10-CM

## 2021-04-07 DIAGNOSIS — G4730 Sleep apnea, unspecified: Secondary | ICD-10-CM | POA: Diagnosis not present

## 2021-04-07 DIAGNOSIS — G4733 Obstructive sleep apnea (adult) (pediatric): Secondary | ICD-10-CM | POA: Insufficient documentation

## 2021-04-07 DIAGNOSIS — I5030 Unspecified diastolic (congestive) heart failure: Secondary | ICD-10-CM | POA: Diagnosis not present

## 2021-04-07 MED ORDER — POMALIDOMIDE 4 MG PO CAPS: 4.0000 mg | ORAL_CAPSULE | Freq: Every day | ORAL | 0 refills | Status: DC

## 2021-04-07 MED ORDER — SPIRONOLACTONE 25 MG PO TABS: 12.5000 mg | ORAL_TABLET | Freq: Every day | ORAL | 3 refills | Status: DC

## 2021-04-07 NOTE — Patient Instructions (Signed)
Medication Instructions:  ?Start Aldactone 12.5 mg tablets by mouth daily ? ?Labwork: ?In 2 weeks: ?BNP ?BMP ? ?Follow-Up: ?Follow up with Dr. Gasper Sells or APP in 6 months.  ? ?Any Other Special Instructions Will Be Listed Below (If Applicable). ? ?You have been referred to Pulmonology. They will contact your with your first appointment.  ? ? ?If you need a refill on your cardiac medications before your next appointment, please call your pharmacy. ? ?

## 2021-04-07 NOTE — Telephone Encounter (Signed)
Chart reviewed. Pomalyst refilled per last office note with Dr. Katragadda.  

## 2021-04-08 ENCOUNTER — Telehealth: Payer: Self-pay | Admitting: Pharmacist

## 2021-04-08 ENCOUNTER — Other Ambulatory Visit (HOSPITAL_COMMUNITY): Payer: Self-pay

## 2021-04-08 MED ORDER — POMALIDOMIDE 4 MG PO CAPS: 4.0000 mg | ORAL_CAPSULE | Freq: Every day | ORAL | 0 refills | Status: DC

## 2021-04-08 MED ORDER — OZEMPIC (0.25 OR 0.5 MG/DOSE) 2 MG/1.5ML ~~LOC~~ SOPN: 0.5000 mg | PEN_INJECTOR | SUBCUTANEOUS | 1 refills | Status: DC

## 2021-04-08 NOTE — Telephone Encounter (Signed)
Received referral from Dr. Dwyane Dee to start semaglutide for this patient for weight loss. They meet FDA approved criteria for semaglutide for use in obesity given BMI 64. Obesity is complicated by chronic conditions including DM, HTN and OSA.  ?  ?No contraindications seen in the chart to [facility] use. Patient confirmed they have no personal or family history of medullary thyroid carcinoma. This was reviewed at [place] with Dr. Gasper Sells. ?  ?Patient is interested in starting semaglutide for weight loss. PA for Ozempic has been submitted to his insurance. No PA needed. Mancel Parsons is not covered by medicare. ? ?I called pt and LVM for her to all back to discuss. ?

## 2021-04-08 NOTE — Telephone Encounter (Signed)
Spoke with patient. She is interested in the medication. She has both medicare and medicaid. She has been scheduled for 3/7 for education and injection technique. She is aware to pick up medication and put in fridge. Bring pen with her to appointment. Address and directions provided to our office. ? ?Confirmed no personal or family history of medullary thyroid carcinoma, or hx of pancreatitis or gallstones. ?

## 2021-04-11 NOTE — Progress Notes (Signed)
Patient ID: Kelli Hurley                 DOB: April 13, 1955                    MRN: [id number] ? ? ? ? ?HPI: ?Kelli Hurley is a 66 y.o. female patient referred to pharmacy clinic by Dr. Gasper Sells to initiate weight loss therapy with GLP1-RA. PMH is significant for obesity complicated by chronic medical conditions including HTN, HFpEF, CKD stage 3, OSA on nocturnal O2, multiple myeloma.  ? ?Today, patient arrives in good spirits using a wheelchair and is accompanied by her daughter. She brings her Ozempic with her to administer the first dose in office together.  ? ?Current weight management medications: None  ? ?Previously tried meds: None ? ?Current meds that may affect weight: Dexamethasone (weight gain) ? ?Baseline weight/BMI: 350.2 lb (BMI 64.05) ? ?Insurance payor: [employer] + Medicaid. Ozempic on formulary. Reports it was very affordable.  ? ?Diet:  ?-Breakfast: 2 eggs, toast, bacon, sometimes potatoes ?-Lunch: pinto beans, stuffing, [place] ?-Dinner: corn, fatback, turnip greens, cornbread ?-Snacks: chips, nectarine, plum, cake ?-Drinks: water, lemonade ? ?Exercise: Active doing chores around the house. Activity is limited by wheelchair.  ? ?Family History: The patient's family history includes Cancer in her father, maternal grandmother, and mother; Hypertension in her brother, father, mother, and sister; Prostate cancer in her brother. There is no history of Colon cancer or Colon polyps. ? ?Social History: Never smoker. No alcohol use.  ? ?Labs: ?No results found for: HGBA1C ? ?Wt Readings from Last 1 Encounters:  ?04/12/21 (!) 350 lb 3.2 oz (158.8 kg)  ? ? ?BP Readings from Last 1 Encounters:  ?04/12/21 108/64  ? ?Pulse Readings from Last 1 Encounters:  ?04/12/21 85  ? ? ?No results found for: CHOL, TRIG, HDL, CHOLHDL, VLDL, LDLCALC, LDLDIRECT ? ?Past Medical History:  ?Diagnosis Date  ? Anemia   ? Arthritis   ? Cancer [facility])   ? Claustrophobia 10/05/2014  ? Hypertension   ?  Leukopenia 08/03/2014  ? Normocytic hypochromic anemia 08/03/2014  ? Renal disorder   ? stage 3   ? ? ?Current Outpatient Medications on File Prior to Visit  ?Medication Sig Dispense Refill  ? acetaminophen (TYLENOL) 650 MG CR tablet Take 650 mg by mouth every 8 (eight) hours as needed for pain.    ? acyclovir (ZOVIRAX) 400 MG tablet TAKE 1 TABLET BY MOUTH TWICE DAILY 60 tablet 6  ? albuterol (VENTOLIN HFA) 108 (90 Base) MCG/ACT inhaler Inhale 2 puffs into the lungs every 6 (six) hours as needed for wheezing or shortness of breath. 8 g 3  ? albuterol (VENTOLIN HFA) 108 (90 Base) MCG/ACT inhaler 2 puffs as needed    ? ALPRAZolam (XANAX) 1 MG tablet Take 1 mg by mouth daily as needed.    ? Ascorbic Acid (VITAMIN C) 1000 MG tablet Take 1,000 mg by mouth daily.     ? aspirin EC 81 MG tablet Take 81 mg by mouth daily.    ? calcitRIOL (ROCALTROL) 0.25 MCG capsule Take by mouth.    ? calcium carbonate (OS-CAL) 1250 (500 Ca) MG chewable tablet Chew 1 tablet by mouth daily.     ? cholecalciferol (VITAMIN D) 1000 units tablet Take 1,000 Units by mouth daily.    ? dapagliflozin propanediol (FARXIGA) 10 MG TABS tablet Take 1 tablet (10 mg total) by mouth daily before breakfast. 30 tablet 6  ? dexamethasone (DECADRON)  4 MG tablet Take 5 tablets once a week 30 tablet 6  ? Elastic Bandages & Supports (MEDICAL COMPRESSION STOCKINGS) MISC See admin instructions.    ? Elastic Bandages & Supports (WRIST SPLINT) MISC See admin instructions.    ? furosemide (LASIX) 20 MG tablet Take 2 tablets (40 mg total) by mouth in the morning AND 2 tablets (40 mg total) at bedtime. 180 tablet 3  ? gabapentin (NEURONTIN) 300 MG capsule Take 1 capsule (300 mg total) by mouth 3 (three) times daily. 90 capsule 5  ? lidocaine-prilocaine (EMLA) cream APPLY A QUATER SIZED 1 HOUR PRIOR TO TREATMENT 30 g 0  ? losartan (COZAAR) 25 MG tablet Take 12.5 mg by mouth daily.    ? magnesium oxide (MAG-OX) 400 (240 Mg) MG tablet TAKE 1 TABLET BY MOUTH TWICE DAILY 60  tablet 6  ? metolazone (ZAROXOLYN) 2.5 MG tablet TAKE 1 TABLET BY MOUTH ONCE DAILY. 30 tablet 6  ? Misc. Devices MISC Please provide Rolater (extra large) walker 1 Units 0  ? Multiple Vitamin (TAB-A-VITE) TABS TAKE 1 TABLET BY MOUTH ONCE DAILY. (Patient taking differently: Take 1 tablet by mouth daily.) 30 tablet 11  ? oxyCODONE-acetaminophen (PERCOCET/ROXICET) 5-325 MG tablet Take 1-2 tablets by mouth every 12 (twelve) hours as needed for severe pain. 60 tablet 0  ? pomalidomide (POMALYST) 4 MG capsule Take 1 capsule (4 mg total) by mouth daily. 21 days on, 7 days off 21 capsule 0  ? potassium chloride SA (KLOR-CON) 20 MEQ tablet TAKE (1) TABLET BY MOUTH THREE TIMES DAILY 90 tablet 6  ? Semaglutide,0.25 or 0.5MG/DOS, (OZEMPIC, 0.25 OR 0.5 MG/DOSE,) 2 MG/1.5ML SOPN Inject 0.5 mg into the skin once a week. 1.5 mL 1  ? spironolactone (ALDACTONE) 25 MG tablet Take 0.5 tablets (12.5 mg total) by mouth daily. 45 tablet 3  ? trolamine salicylate (ASPERCREME) 10 % cream Apply 1 application topically 2 (two) times daily as needed for muscle pain.    ? ?Current Facility-Administered Medications on File Prior to Visit  ?Medication Dose Route Frequency Provider Last Rate Last Admin  ? heparin lock flush 100 unit/mL  500 Units Intravenous Once [place], Kelby Fam, MD      ? sodium chloride 0.9 % injection 10 mL  10 mL Intravenous Once Pen[place], Kelby Fam, MD      ? ? ?Allergies  ?Allergen Reactions  ? Diclofenac Swelling  ?  Per pt, facial swelling  ? ? ? ?Assessment/Plan: ? ?1. Weight loss - Patient has not met goal of at least 5% of body weight loss with comprehensive lifestyle modifications alone in the past 3-6 months. Pharmacotherapy is appropriate to pursue as augmentation. Will start Ozempic. Confirmed patient not pregnant and no personal or family history of medullary thyroid carcinoma (MTC) or Multiple Endocrine Neoplasia syndrome type 2 (MEN 2).  ? ?Advised patient on common side effects including nausea, diarrhea,  dyspepsia, decreased appetite, and fatigue. Counseled patient on reducing meal size and how to titrate medication to minimize side effects. Counseled patient to call if intolerable side effects or if experiencing dehydration, abdominal pain, or dizziness. Patient will adhere to dietary modifications and will target at least 150 minutes of moderate intensity exercise weekly.  ? ?Injection technique reviewed at today's visit and patient successfully self-administered first dose of Ozempic into the fatty tissue of the abdomen. ? ?GLP1 Agonist Titration Plan:  ?Will plan to follow the titration plan as below, pending patient is tolerating each dose before increasing to the next. Can  slow titration if needed for tolerability.  ?-Month 1: Inject Ozempic 0.25 mg SQ once weekly x 4 weeks ?-Month 2: Inject Ozempic 0.5 mg  SQ once weekly x 6 weeks ?-Month 3: Inject Ozempic 1 mg SQ once weekly x 4 weeks ?-Month 4+: Inject Ozempic 2 mg SQ once weekly ? ?Follow up in 1 month by phone for dose titration. ? ?Rebbeca Paul, PharmD ?PGY2 Ambulatory Care Pharmacy Resident ?04/12/2021 10:27 AM ? ?

## 2021-04-12 ENCOUNTER — Other Ambulatory Visit: Payer: Self-pay

## 2021-04-12 ENCOUNTER — Ambulatory Visit (INDEPENDENT_AMBULATORY_CARE_PROVIDER_SITE_OTHER): Payer: Medicare Other | Admitting: Student-PharmD

## 2021-04-12 DIAGNOSIS — I509 Heart failure, unspecified: Secondary | ICD-10-CM

## 2021-04-12 DIAGNOSIS — I129 Hypertensive chronic kidney disease with stage 1 through stage 4 chronic kidney disease, or unspecified chronic kidney disease: Secondary | ICD-10-CM

## 2021-04-12 DIAGNOSIS — G4733 Obstructive sleep apnea (adult) (pediatric): Secondary | ICD-10-CM

## 2021-04-12 LAB — HEMOGLOBIN A1C
Est. average glucose Bld gHb Est-mCnc: 151 mg/dL
Hgb A1c MFr Bld: 6.9 % — ABNORMAL HIGH (ref 4.8–5.6)

## 2021-04-12 NOTE — Patient Instructions (Addendum)
Nice meeting you today!  Your next dose is due next Tuesday 3/15. I will give you a call in a few weeks to see how you're doing and we will plan to increase the dose at that time.   Ozempic Counseling Points This medication reduces your appetite and may make you feel fuller longer.  Stop eating when your body tells you that you are full. This will likely happen sooner than you are used to. Store your medication in the fridge until you are ready to use it. Inject your medication in the fatty tissue of your lower abdominal area (2 inches away from belly button) or upper outer thigh. Rotate injection sites. Each pen will last you about 1 month (the first month it will last a few weeks longer). Use a different needle with each weekly injection. Common side effects include: nausea, diarrhea/constipation, and heartburn, and are more likely to occur if you overeat.  Dosing schedule: Will plan to follow the titration plan as below, pending you are tolerating each dose before increasing to the next. Can slow titration if needed for tolerability.  -Month 1: Inject Ozempic 0.25 mg SQ once weekly x 4 weeks -Month 2: Inject Ozempic 0.5 mg  SQ once weekly x 6 weeks -Month 3: Inject Ozempic 1 mg SQ once weekly x 4 weeks -Month 4+: Inject Ozempic 2 mg SQ once weekly  Tips for living a healthier life     Building a Healthy and Balanced Diet Make most of your meal vegetables and fruits -  of your plate. Aim for color and variety, and remember that potatoes dont count as vegetables on the Healthy Eating Plate because of their negative impact on blood sugar.  Go for whole grains -  of your plate. Whole and intact grains--whole wheat, barley, wheat berries, quinoa, oats, brown rice, and foods made with them, such as whole wheat pasta--have a milder effect on blood sugar and insulin than white bread, white rice, and other refined grains.  Protein power -  of your plate. Fish, poultry, beans, and nuts  are all healthy, versatile protein sources--they can be mixed into salads, and pair well with vegetables on a plate. Limit red meat, and avoid processed meats such as bacon and sausage.  Healthy plant oils - in moderation. Choose healthy vegetable oils like olive, canola, soy, corn, sunflower, peanut, and others, and avoid partially hydrogenated oils, which contain unhealthy trans fats. Remember that low-fat does not mean healthy.  Drink water, coffee, or tea. Skip sugary drinks, limit milk and dairy products to one to two servings per day, and limit juice to a small glass per day.  Stay active. The red figure running across the [place] is a reminder that staying active is also important in weight control.  The main message of the Healthy Eating Plate is to focus on diet quality:  The type of carbohydrate in the diet is more important than the amount of carbohydrate in the diet, because some sources of carbohydrate--like vegetables (other than potatoes), fruits, whole grains, and beans--are healthier than others. The Healthy Eating Plate also advises consumers to avoid sugary beverages, a major source of calories--usually with little nutritional value--in the American diet. The Healthy Eating Plate encourages consumers to use healthy oils, and it does not set a maximum on the percentage of calories people should get each day from healthy sources of fat. In this way, the Healthy Eating Plate recommends the opposite of the low-fat message promoted for  decades by the USDA.  [employer].no  SUGAR  Sugar is a huge problem in the modern day diet. Sugar is a big contributor to heart disease, diabetes, high triglyceride levels, fatty liver disease and obesity. Sugar is hidden in almost all packaged foods/beverages. Added sugar is extra sugar that is added beyond what is naturally found and has no nutritional benefit for your  body. The American Heart Association recommends limiting added sugars to no more than 25g for women and 36 grams for men per day. There are many names for sugar including maltose, sucrose (names ending in "ose"), high fructose corn syrup, molasses, cane sugar, corn sweetener, raw sugar, syrup, honey or fruit juice concentrate.   One of the best ways to limit your added sugars is to stop drinking sweetened beverages such as soda, sweet tea, and fruit juice.  There is 65g of added sugars in one 20oz bottle of Coke! That is equal to 7.5 donuts.   Pay attention and read all nutrition facts labels. Below is an examples of a nutrition facts label. The #1 is showing you the total sugars where the # 2 is showing you the added sugars. This one serving has almost the max amount of added sugars per day!     20 oz Soda 65g Sugar = 7.5 Glazed Donuts  16oz Energy  Drink 54g Sugar = 6.5 Glazed Donuts  Large Sweet  Tea 38g Sugar = 4 Glazed Donuts  20oz Sports  Drink 34g Sugar = 3.5 Glazed Donuts  8oz Chocolate Milk 24g Sugar =2.5 Glazed Donuts  8oz Orange  Juice 21g Sugar = 2 Glazed Donuts  1 Juice Box 14g Sugar = 1.5 Glazed Donuts  16oz Water= NO SUGAR!!  EXERCISE  Exercise is good. Weve all heard that. In an ideal world, we would all have time and resources to get plenty of it. When you are active, your heart pumps more efficiently and you will feel better.  Multiple studies show that even walking regularly has benefits that include living a longer life. The American Heart Association recommends 150 minutes per week of exercise (30 minutes per day most days of the week). You can do this in any increment you wish. Nine or more 10-minute walks count. So does an hour-long exercise class. Break the time apart into what will work in your life. Some of the best things you can do include walking briskly, jogging, cycling or swimming laps. Not everyone is ready to exercise. Sometimes we need to  start with just getting active. Here are some easy ways to be more active throughout the day:  Take the stairs instead of the elevator  Go for a 10-15 minute walk during your lunch break (find a friend to make it more enjoyable)  When shopping, park at the back of the parking lot  If you take public transportation, get off one stop early and walk the extra distance  Pace around while making phone calls  Check with your doctor if you arent sure what your limitations may be. Always remember to drink plenty of water when doing any type of exercise. Dont feel like a failure if youre not getting the 90-150 minutes per week. If you started by being a couch potato, then just a 10-minute walk each day is a huge improvement. Start with little victories and work your way up.   HEALTHY EATING TIPS  When looking to improve your eating habits, whether to lose weight, lower blood pressure or just be healthier,  it helps to know what a serving size is.   Grains 1 slice of bread,  bagel,  cup pasta or rice  Vegetables 1 cup fresh or raw vegetables,  cup cooked or canned Fruits 1 piece of medium sized fruit,  cup canned,   Meats/Proteins  cup dried       1 oz meat, 1 egg,  cup cooked beans, nuts or seeds  Dairy        Fats Individual yogurt container, 1 cup (8oz)    1 teaspoon margarine/butter or vegetable  milk or milk alternative, 1 slice of cheese          oil; 1 tablespoon mayonnaise or salad dressing                  Plan ahead: make a menu of the meals for a week then create a grocery list to go with that menu. Consider meals that easily stretch into a night of leftovers, such as stews or casseroles. Or consider making two of your favorite meal and put one in the freezer for another night. Try a night or two each week that is meatless or no cook such as salads. When you get home from the grocery store wash and prepare your vegetables and fruits. Then when you need them they are ready to go.    Tips for going to the grocery store:  [place] store or generic brands  Check the weekly ad from your store on-line or in their in-store flyer  Look at the unit price on the shelf tag to compare/contrast the costs of different items  Buy fruits/vegetables in season  Carrots, bananas and apples are low-cost, naturally healthy items  If meats or frozen vegetables are on sale, buy some extras and put in your freezer  Limit buying prepared or ready to eat items, even if they are pre-made salads or fruit snacks  Do not shop when youre hungry  Foods at eye level tend to be more expensive. Look on the high and low shelves for deals.  Consider shopping at the farmers market for fresh foods in season.  Avoid the cookie and chip aisles (these are expensive, high in calories and low in nutritional value). Shop on the outside of the grocery store.  Healthy food preparations:  If you cant get lean hamburger, be sure to drain the fat when cooking  Steam, saut (in olive oil), grill or bake foods  Experiment with different seasonings to avoid adding salt to your foods. Kosher salt, sea salt and Himalayan salt are all still salt and should be avoided. Try seasoning food with onion, garlic, thyme, rosemary, basil ect. Onion powder or garlic powder is ok. Avoid if it says salt (ie garlic salt).

## 2021-04-13 ENCOUNTER — Telehealth: Payer: Self-pay | Admitting: Student-PharmD

## 2021-04-13 NOTE — Telephone Encounter (Signed)
Checked baseline A1c at office visit yesterday prior to Ozempic start. A1c falls in diabetic range. Called patient to review results. Explained that [place], which was started yesterday, will help to improve her blood sugars and lower her A1c. She confirmed understanding and has no further questions at this time.  ?

## 2021-04-20 ENCOUNTER — Other Ambulatory Visit (HOSPITAL_COMMUNITY): Payer: Self-pay

## 2021-04-20 DIAGNOSIS — G8929 Other chronic pain: Secondary | ICD-10-CM

## 2021-04-20 DIAGNOSIS — C9000 Multiple myeloma not having achieved remission: Secondary | ICD-10-CM

## 2021-04-20 MED ORDER — POTASSIUM CHLORIDE CRYS ER 20 MEQ PO TBCR: EXTENDED_RELEASE_TABLET | ORAL | 6 refills | Status: DC

## 2021-04-20 MED ORDER — MAGNESIUM OXIDE -MG SUPPLEMENT 400 (240 MG) MG PO TABS: 1.0000 | ORAL_TABLET | Freq: Two times a day (BID) | ORAL | 6 refills | Status: DC

## 2021-04-20 MED ORDER — OXYCODONE-ACETAMINOPHEN 5-325 MG PO TABS: 1.0000 | ORAL_TABLET | Freq: Two times a day (BID) | ORAL | 0 refills | Status: DC | PRN

## 2021-04-20 MED ORDER — GABAPENTIN 300 MG PO CAPS: 300.0000 mg | ORAL_CAPSULE | Freq: Three times a day (TID) | ORAL | 5 refills | Status: DC

## 2021-04-20 MED ORDER — ACYCLOVIR 400 MG PO TABS: 400.0000 mg | ORAL_TABLET | Freq: Two times a day (BID) | ORAL | 6 refills | Status: DC

## 2021-04-20 MED ORDER — LIDOCAINE-PRILOCAINE 2.5-2.5 % EX CREA: TOPICAL_CREAM | CUTANEOUS | 0 refills | Status: DC

## 2021-04-21 ENCOUNTER — Other Ambulatory Visit: Payer: Self-pay

## 2021-04-21 ENCOUNTER — Other Ambulatory Visit (HOSPITAL_COMMUNITY): Payer: Self-pay | Admitting: Hematology

## 2021-04-21 ENCOUNTER — Other Ambulatory Visit (HOSPITAL_COMMUNITY)
Admission: RE | Admit: 2021-04-21 | Discharge: 2021-04-21 | Disposition: A | Discharge status: Home / Self Care | Payer: Medicare Other | Source: Ambulatory Visit | Attending: Internal Medicine | Admitting: Internal Medicine

## 2021-04-21 DIAGNOSIS — I5030 Unspecified diastolic (congestive) heart failure: Secondary | ICD-10-CM | POA: Insufficient documentation

## 2021-04-21 LAB — BASIC METABOLIC PANEL
Anion gap: 13 (ref 5–15)
BUN: 43 mg/dL — ABNORMAL HIGH (ref 8–23)
CO2: 28 mmol/L (ref 22–32)
Calcium: 9.7 mg/dL (ref 8.9–10.3)
Chloride: 94 mmol/L — ABNORMAL LOW (ref 98–111)
Creatinine, Ser: 1.88 mg/dL — ABNORMAL HIGH (ref 0.44–1.00)
GFR, Estimated: 29 mL/min — ABNORMAL LOW (ref >60)
Glucose, Bld: 98 mg/dL (ref 70–99)
Potassium: 3.8 mmol/L (ref 3.5–5.1)
Sodium: 135 mmol/L (ref 135–145)

## 2021-04-21 LAB — BRAIN NATRIURETIC PEPTIDE: B Natriuretic Peptide: 86 pg/mL (ref 0.0–100.0)

## 2021-04-21 MED ORDER — FUROSEMIDE 20 MG PO TABS: ORAL_TABLET | ORAL | 3 refills | Status: DC

## 2021-04-21 MED ORDER — DAPAGLIFLOZIN PROPANEDIOL 10 MG PO TABS: 10.0000 mg | ORAL_TABLET | Freq: Every day | ORAL | 6 refills | Status: DC

## 2021-04-21 MED ORDER — SPIRONOLACTONE 25 MG PO TABS: 12.5000 mg | ORAL_TABLET | Freq: Every day | ORAL | 3 refills | Status: DC

## 2021-04-22 MED ORDER — OZEMPIC (0.25 OR 0.5 MG/DOSE) 2 MG/1.5ML ~~LOC~~ SOPN
0.5000 mg | PEN_INJECTOR | SUBCUTANEOUS | 1 refills | Status: DC
Start: 2021-04-22 — End: 2021-05-31

## 2021-04-25 ENCOUNTER — Telehealth: Payer: Self-pay

## 2021-04-25 NOTE — Telephone Encounter (Signed)
-----   Message from Werner Lean, MD sent at 04/25/2021 10:54 AM EDT ----- ?Results: ?Creatinine increases and has persisted on aldactone ?Plan: ?Stop aldactone, labs to [facility] ? ?Werner Lean, MD ? ?

## 2021-04-25 NOTE — Telephone Encounter (Signed)
The patient has been notified of the result and verbalized understanding.  All questions (if any) were answered. ?Precious Gilding, RN 04/25/2021 12:25 PM   ?Labs routed to [facility] via epic to Dr. Theador Hawthorne.  ?

## 2021-04-26 ENCOUNTER — Telehealth: Payer: Self-pay | Admitting: Internal Medicine

## 2021-04-26 NOTE — Telephone Encounter (Signed)
Kelli Hurley w/ [facility] said they faxed in a information regarding pt needs to initiate Statin agent to pt because pt has diabetes.  ?

## 2021-04-27 ENCOUNTER — Other Ambulatory Visit (HOSPITAL_COMMUNITY): Payer: Self-pay | Admitting: Hematology

## 2021-04-27 DIAGNOSIS — C9000 Multiple myeloma not having achieved remission: Secondary | ICD-10-CM

## 2021-04-27 NOTE — Telephone Encounter (Signed)
Noted  

## 2021-04-27 NOTE — Telephone Encounter (Signed)
I will forward this to the provider as it is at his discretion. ?

## 2021-05-03 ENCOUNTER — Other Ambulatory Visit (HOSPITAL_COMMUNITY): Payer: Self-pay

## 2021-05-03 ENCOUNTER — Telehealth: Payer: Self-pay | Admitting: Student-PharmD

## 2021-05-03 MED ORDER — POMALIDOMIDE 4 MG PO CAPS: 4.0000 mg | ORAL_CAPSULE | Freq: Every day | ORAL | 0 refills | Status: DC

## 2021-05-03 NOTE — Telephone Encounter (Signed)
Called patient to see how she is doing since starting Ozempic 0.25 mg. She reports doing well, no adverse effects. She has one week left of 0.25 mg then will increase to 0.5 mg next week. Will call her in about a month to see how she is tolerating this with plan to continue increasing to max tolerated dose.  ?

## 2021-05-03 NOTE — Telephone Encounter (Signed)
Chart reviewed. Pomalyst refilled per last office note with Dr. Katragadda.  

## 2021-05-10 ENCOUNTER — Other Ambulatory Visit (HOSPITAL_COMMUNITY): Payer: Self-pay | Admitting: Hematology

## 2021-05-13 ENCOUNTER — Telehealth: Payer: Self-pay | Admitting: Internal Medicine

## 2021-05-13 DIAGNOSIS — E119 Type 2 diabetes mellitus without complications: Secondary | ICD-10-CM

## 2021-05-13 NOTE — Telephone Encounter (Signed)
? ?  Abhi with [facility] calling, he said he would like to give medical recommendation for pt to be on statin drug since pt is diabetic and in the age group 53-65 years old ?

## 2021-05-16 MED ORDER — ROSUVASTATIN CALCIUM 10 MG PO TABS: 10.0000 mg | ORAL_TABLET | Freq: Every day | ORAL | 3 refills | Status: DC

## 2021-05-16 NOTE — Telephone Encounter (Signed)
Will fwd to provider

## 2021-05-16 NOTE — Telephone Encounter (Signed)
Pt notified and verbalized understanding. Pt had no questions or concerns at this time.  

## 2021-05-23 ENCOUNTER — Encounter: Payer: Self-pay | Admitting: Orthopedic Surgery

## 2021-05-23 ENCOUNTER — Ambulatory Visit (INDEPENDENT_AMBULATORY_CARE_PROVIDER_SITE_OTHER): Payer: Medicare Other | Admitting: Orthopedic Surgery

## 2021-05-23 VITALS — Ht 62.0 in | Wt 340.0 lb

## 2021-05-23 DIAGNOSIS — G8929 Other chronic pain: Secondary | ICD-10-CM

## 2021-05-23 DIAGNOSIS — M25561 Pain in right knee: Secondary | ICD-10-CM | POA: Diagnosis not present

## 2021-05-23 DIAGNOSIS — M25562 Pain in left knee: Secondary | ICD-10-CM | POA: Diagnosis not present

## 2021-05-23 NOTE — Progress Notes (Signed)
FOLLOW UP  ? ?Encounter Diagnoses  ?Name Primary?  ? Chronic pain of left knee Yes  ? Chronic pain of right knee   ? ? ? ?Chief Complaint  ?Patient presents with  ? bilateral knee pain  ?  Patient requests bilateral knee cortisone injections. Right knee pain is greater than left. Right knee seems to be swollen. She has had previous injections with some relief. Takes Oxycodone for pain with some relief.  ? ? ? ?Kelli Hurley comes in today asking for bilateral knee injections she has chronic pain left and right knee she is morbidly obese with BMI of 62 I have nothing to offer her as she is already on oxycodone and cannot have surgery due to the BMI issue ? ?Both knees were injected ? ?Procedure note for bilateral knee injections ? ?Procedure note left knee injection verbal consent was obtained to inject left knee joint ? ?Timeout was completed to confirm the site of injection ? ?The medications used were 40 mg depomedrol and 3 cc of 1% lidocaine  ?Anesthesia was provided by ethyl chloride and the skin was prepped with alcohol. ? ?After cleaning the skin with alcohol a 20-gauge needle was used to inject the left knee joint. There were no complications. A sterile bandage was applied. ? ? ?Procedure note right knee injection verbal consent was obtained to inject right knee joint ? ?Timeout was completed to confirm the site of injection ? ?The medications used were 40 mg depomedrol and 3 cc of 1% lidocaine  ?Anesthesia was provided by ethyl chloride and the skin was prepped with alcohol. ? ?After cleaning the skin with alcohol a 20-gauge needle was used to inject the right knee joint. There were no complications. A sterile bandage was applied. ? ?

## 2021-05-24 ENCOUNTER — Other Ambulatory Visit (HOSPITAL_COMMUNITY): Payer: Self-pay

## 2021-05-24 DIAGNOSIS — G8929 Other chronic pain: Secondary | ICD-10-CM

## 2021-05-24 MED ORDER — OXYCODONE-ACETAMINOPHEN 5-325 MG PO TABS: 1.0000 | ORAL_TABLET | Freq: Two times a day (BID) | ORAL | 0 refills | Status: DC | PRN

## 2021-05-31 ENCOUNTER — Other Ambulatory Visit (HOSPITAL_COMMUNITY): Payer: Self-pay

## 2021-05-31 MED ORDER — OZEMPIC (1 MG/DOSE) 4 MG/3ML ~~LOC~~ SOPN
1.0000 mg | PEN_INJECTOR | SUBCUTANEOUS | 0 refills | Status: DC
Start: 2021-05-31 — End: 2021-08-16

## 2021-05-31 MED ORDER — POMALIDOMIDE 4 MG PO CAPS: 4.0000 mg | ORAL_CAPSULE | Freq: Every day | ORAL | 0 refills | Status: DC

## 2021-05-31 NOTE — Addendum Note (Signed)
Addended by: Marcelle Overlie D on: 05/31/2021 09:39 AM ? ? Modules accepted: Orders ? ?

## 2021-05-31 NOTE — Telephone Encounter (Signed)
Pharmacy requesting refill on ozempic 0.25/0.5. Patient due to increase to '1mg'$  soon. ? ?Spoke with patient, she had increased to 0.'5mg'$  but then said she started over at 0.'25mg'$  when she started the new box. I think she mistunderstood. I asked her to increase back to 0.'5mg'$  weekly. She should have 4 more doses of 0.'5mg'$  in her pen. I will send '1mg'$  dose to [facility] per her request for her to start when she is finished with 0.'5mg'$  injections. ?She is doing well. No side effects. ?

## 2021-05-31 NOTE — Telephone Encounter (Signed)
Chart reviewed. Pomalyst refilled per last office note with Dr. Katragadda.  

## 2021-06-13 ENCOUNTER — Other Ambulatory Visit (HOSPITAL_COMMUNITY): Payer: Self-pay | Admitting: Hematology

## 2021-06-15 ENCOUNTER — Inpatient Hospital Stay (HOSPITAL_COMMUNITY): Payer: Medicare Other | Attending: Physician Assistant

## 2021-06-15 DIAGNOSIS — Z8249 Family history of ischemic heart disease and other diseases of the circulatory system: Secondary | ICD-10-CM | POA: Insufficient documentation

## 2021-06-15 DIAGNOSIS — M25562 Pain in left knee: Secondary | ICD-10-CM | POA: Diagnosis not present

## 2021-06-15 DIAGNOSIS — R0602 Shortness of breath: Secondary | ICD-10-CM | POA: Diagnosis not present

## 2021-06-15 DIAGNOSIS — Z8042 Family history of malignant neoplasm of prostate: Secondary | ICD-10-CM | POA: Diagnosis not present

## 2021-06-15 DIAGNOSIS — Z7961 Long term (current) use of immunomodulator: Secondary | ICD-10-CM | POA: Diagnosis not present

## 2021-06-15 DIAGNOSIS — Z79899 Other long term (current) drug therapy: Secondary | ICD-10-CM | POA: Insufficient documentation

## 2021-06-15 DIAGNOSIS — Z9484 Stem cells transplant status: Secondary | ICD-10-CM | POA: Diagnosis not present

## 2021-06-15 DIAGNOSIS — Z9481 Bone marrow transplant status: Secondary | ICD-10-CM | POA: Insufficient documentation

## 2021-06-15 DIAGNOSIS — Z8719 Personal history of other diseases of the digestive system: Secondary | ICD-10-CM | POA: Diagnosis not present

## 2021-06-15 DIAGNOSIS — M2550 Pain in unspecified joint: Secondary | ICD-10-CM | POA: Insufficient documentation

## 2021-06-15 DIAGNOSIS — Z809 Family history of malignant neoplasm, unspecified: Secondary | ICD-10-CM | POA: Insufficient documentation

## 2021-06-15 DIAGNOSIS — I10 Essential (primary) hypertension: Secondary | ICD-10-CM | POA: Insufficient documentation

## 2021-06-15 DIAGNOSIS — M25561 Pain in right knee: Secondary | ICD-10-CM | POA: Diagnosis not present

## 2021-06-15 DIAGNOSIS — C9000 Multiple myeloma not having achieved remission: Secondary | ICD-10-CM | POA: Insufficient documentation

## 2021-06-15 DIAGNOSIS — R200 Anesthesia of skin: Secondary | ICD-10-CM | POA: Diagnosis not present

## 2021-06-15 DIAGNOSIS — N183 Chronic kidney disease, stage 3 unspecified: Secondary | ICD-10-CM

## 2021-06-15 DIAGNOSIS — G629 Polyneuropathy, unspecified: Secondary | ICD-10-CM | POA: Diagnosis not present

## 2021-06-15 DIAGNOSIS — R600 Localized edema: Secondary | ICD-10-CM | POA: Insufficient documentation

## 2021-06-15 DIAGNOSIS — E876 Hypokalemia: Secondary | ICD-10-CM | POA: Insufficient documentation

## 2021-06-15 DIAGNOSIS — Z7952 Long term (current) use of systemic steroids: Secondary | ICD-10-CM | POA: Insufficient documentation

## 2021-06-15 LAB — CBC WITH DIFFERENTIAL/PLATELET
Abs Immature Granulocytes: 0.34 K/uL — ABNORMAL HIGH (ref 0.00–0.07)
Basophils Absolute: 0 K/uL (ref 0.0–0.1)
Basophils Relative: 0 %
Eosinophils Absolute: 0 K/uL (ref 0.0–0.5)
Eosinophils Relative: 0 %
HCT: 33 % — ABNORMAL LOW (ref 36.0–46.0)
Hemoglobin: 10.6 g/dL — ABNORMAL LOW (ref 12.0–15.0)
Immature Granulocytes: 5 %
Lymphocytes Relative: 13 %
Lymphs Abs: 0.9 K/uL (ref 0.7–4.0)
MCH: 30.5 pg (ref 26.0–34.0)
MCHC: 32.1 g/dL (ref 30.0–36.0)
MCV: 94.8 fL (ref 80.0–100.0)
Monocytes Absolute: 0.9 K/uL (ref 0.1–1.0)
Monocytes Relative: 13 %
Neutro Abs: 4.6 K/uL (ref 1.7–7.7)
Neutrophils Relative %: 69 %
Platelets: 298 K/uL (ref 150–400)
RBC: 3.48 MIL/uL — ABNORMAL LOW (ref 3.87–5.11)
RDW: 15.5 % (ref 11.5–15.5)
WBC: 6.7 K/uL (ref 4.0–10.5)
nRBC: 0 % (ref 0.0–0.2)

## 2021-06-15 LAB — LACTATE DEHYDROGENASE: LDH: 179 U/L (ref 98–192)

## 2021-06-15 LAB — FERRITIN: Ferritin: 103 ng/mL (ref 11–307)

## 2021-06-15 LAB — IRON AND TIBC
Iron: 65 ug/dL (ref 28–170)
Saturation Ratios: 21 % (ref 10.4–31.8)
TIBC: 306 ug/dL (ref 250–450)
UIBC: 241 ug/dL

## 2021-06-15 LAB — MAGNESIUM: Magnesium: 2.5 mg/dL — ABNORMAL HIGH (ref 1.7–2.4)

## 2021-06-15 MED ORDER — HEPARIN SOD (PORK) LOCK FLUSH 100 UNIT/ML IV SOLN
500.0000 [IU] | Freq: Once | INTRAVENOUS | Status: AC
  Administered 2021-06-15: 500 [IU] via INTRAVENOUS

## 2021-06-15 MED ORDER — SODIUM CHLORIDE 0.9% FLUSH
10.0000 mL | Freq: Once | INTRAVENOUS | Status: AC
  Administered 2021-06-15: 10 mL via INTRAVENOUS

## 2021-06-15 NOTE — Patient Instructions (Signed)
[facility]  Discharge Instructions: Thank you for choosing [facility] to provide your oncology and hematology care.  If you have a lab appointment with the Cancer Center, please come in thru the Main Entrance and check in at the main information desk.  Wear comfortable clothing and clothing appropriate for easy access to any Portacath or PICC line.   We strive to give you quality time with your provider. You may need to reschedule your appointment if you arrive late (15 or more minutes).  Arriving late affects you and other patients whose appointments are after yours.  Also, if you miss three or more appointments without notifying the office, you may be dismissed from the clinic at the provider's discretion.      For prescription refill requests, have your pharmacy contact our office and allow 72 hours for refills to be completed.        To help prevent nausea and vomiting after your treatment, we encourage you to take your nausea medication as directed.  BELOW ARE SYMPTOMS THAT SHOULD BE REPORTED IMMEDIATELY: *FEVER GREATER THAN 100.4 F (38 C) OR HIGHER *CHILLS OR SWEATING *NAUSEA AND VOMITING THAT IS NOT CONTROLLED WITH YOUR NAUSEA MEDICATION *UNUSUAL SHORTNESS OF BREATH *UNUSUAL BRUISING OR BLEEDING *URINARY PROBLEMS (pain or burning when urinating, or frequent urination) *BOWEL PROBLEMS (unusual diarrhea, constipation, pain near the anus) TENDERNESS IN MOUTH AND THROAT WITH OR WITHOUT PRESENCE OF ULCERS (sore throat, sores in mouth, or a toothache) UNUSUAL RASH, SWELLING OR PAIN  UNUSUAL VAGINAL DISCHARGE OR ITCHING   Items with * indicate a potential emergency and should be followed up as soon as possible or go to the Emergency Department if any problems should occur.  Please show the CHEMOTHERAPY ALERT CARD or IMMUNOTHERAPY ALERT CARD at check-in to the Emergency Department and triage nurse.  Should you have questions after your visit or need to cancel  or reschedule your appointment, please contact [facility] [phone]  and follow the prompts.  Office hours are 8:00 a.m. to 4:30 p.m. Monday - Friday. Please note that voicemails left after 4:00 p.m. may not be returned until the following business day.  We are closed weekends and major holidays. You have access to a nurse at all times for urgent questions. Please call the main number to the clinic [phone] and follow the prompts.  For any non-urgent questions, you may also contact your provider using MyChart. We now offer e-Visits for anyone 18 and older to request care online for non-urgent symptoms. For details visit [email or web address].   Also download the MyChart app! Go to the app store, search "MyChart", open the app, select [facility], and log in with your MyChart username and password.  Due to Covid, a mask is required upon entering the hospital/clinic. If you do not have a mask, one will be given to you upon arrival. For doctor visits, patients may have 1 support person aged 18 or older with them. For treatment visits, patients cannot have anyone with them due to current Covid guidelines and our immunocompromised population.  

## 2021-06-15 NOTE — Progress Notes (Signed)
Patients port flushed without difficulty.  Good blood return noted with no bruising or swelling noted at site.  Band aid applied.  VSS with discharge and left in satisfactory condition with no s/s of distress noted.   

## 2021-06-16 LAB — KAPPA/LAMBDA LIGHT CHAINS
Kappa free light chain: 24.2 mg/L — ABNORMAL HIGH (ref 3.3–19.4)
Kappa, lambda light chain ratio: 0.81 (ref 0.26–1.65)
Lambda free light chains: 29.7 mg/L — ABNORMAL HIGH (ref 5.7–26.3)

## 2021-06-17 LAB — PROTEIN ELECTROPHORESIS, SERUM
A/G Ratio: 1.1 (ref 0.7–1.7)
Albumin ELP: 3.5 g/dL (ref 2.9–4.4)
Alpha-1-Globulin: 0.3 g/dL (ref 0.0–0.4)
Alpha-2-Globulin: 0.8 g/dL (ref 0.4–1.0)
Beta Globulin: 1 g/dL (ref 0.7–1.3)
Gamma Globulin: 0.9 g/dL (ref 0.4–1.8)
Globulin, Total: 3.1 g/dL (ref 2.2–3.9)
M-Spike, %: 0.2 g/dL — ABNORMAL HIGH
Total Protein ELP: 6.6 g/dL (ref 6.0–8.5)

## 2021-06-17 LAB — IMMUNOFIXATION ELECTROPHORESIS
IgA: 192 mg/dL (ref 87–352)
IgG (Immunoglobin G), Serum: 862 mg/dL (ref 586–1602)
IgM (Immunoglobulin M), Srm: 66 mg/dL (ref 26–217)
Total Protein ELP: 6.5 g/dL (ref 6.0–8.5)

## 2021-06-20 NOTE — Telephone Encounter (Signed)
Spoke with patient. She said she has given 2 injections of the 0.'5mg'$  ozempic. Advised to give 2 more and then increase to '1mg'$  daily. She states she has the '1mg'$  dose at home. ?

## 2021-06-22 ENCOUNTER — Inpatient Hospital Stay (HOSPITAL_BASED_OUTPATIENT_CLINIC_OR_DEPARTMENT_OTHER): Payer: Medicare Other | Admitting: Hematology

## 2021-06-22 VITALS — BP 129/62 | HR 77 | Temp 98.1°F | Resp 18 | Ht 62.0 in | Wt 336.4 lb

## 2021-06-22 DIAGNOSIS — C9000 Multiple myeloma not having achieved remission: Secondary | ICD-10-CM

## 2021-06-22 NOTE — Progress Notes (Signed)
? ?[facility] ?[address] ?[place], [place] [address] ? ? ?CLINIC:  ?Medical Oncology/Hematology ? ?PCP:  ?Zhou-Talbert, Elwyn Lade, MD ?[address] [place] [address] ?[phone] ? ? ?REASON FOR VISIT:  ?Follow-up for multiple myeloma ? ?PRIOR THERAPY:  ?1. Bortezomib x 8 cycles from 10/05/2014 to 04/06/2016. ?2. Carfilzomib x 6 cycles from 03/05/2019 to 08/06/2019. ? ?NGS Results: not done ? ?CURRENT THERAPY: Pomalyst 4 mg 3/4 weeks ? ?BRIEF ONCOLOGIC HISTORY:  ?Oncology History  ?Multiple myeloma ([place])  ?08/07/2014 Imaging  ? Bone Survey- No lytic lesions are noted in the visualized skeleton. ? ?  ?09/03/2014 Bone Marrow Biopsy  ? NORMOCELLULAR BONE MARROW FOR AGE WITH PLASMA CELL NEOPLASM.  The plasma cell component is increased in the marrow representing an estimated 18% of all cells. Cytogenetics with 13q-, 17p- (high risk disease) ? ?  ?09/03/2014 Pathology Results  ? Cytogenetics with 13q-, 17p- (high risk disease) ? ?  ?09/23/2014 Initial Diagnosis  ? Multiple myeloma ? ?  ?09/30/2014 PET scan  ? No abnormal hypermetabolism in the neck, chest, abdomen or pelvis. ? ?  ?10/05/2014 - 03/22/2015 Chemotherapy  ? RVD ? ?  ?10/28/2014 Treatment Plan Change  ? Issues related to getting Revlimid in a timely fashion, therefore, she received her Revlimid on 9/21 resulting in a 12 day cycle this time instead of a 14 day cycle ? ?  ?11/02/2014 Imaging  ? CTA chest- No evidence for a large or central pulmonary embolism as described.  8 mm density along the right minor fissure could represent focal pleural thickening but indeterminate. If the patient is at high risk for bronchogenic carcinoma, follow-up c ? ?  ?11/23/2014 Miscellaneous  ? Zometa 4 mg IV monthly ? ?  ?04/07/2015 Bone Marrow Biopsy  ? Normocellular marrow with 2-4% clonal plasma cells by immunohistochemistry. FISH and cytogenetics were normal [facility])   ? ?  ?04/2015 Miscellaneous  ? PRETRANSPLANT EVALUATION: ? Pulmonary function tests: FEV1  100.3% / DLCO 97.9% ? Echocardiogram: Normal LV function with EF 60-65%  ? ?  ?04/27/2015 Procedure  ? Stem cell mobilization with filgrastim and Mozobil [facility]) ? ?  ?05/06/2015 Miscellaneous  ? BMT conditioning regimen with high-dose Melphalan given [facility], [name]); Day -1 ? ?  ?05/07/2015 Bone Marrow Transplant  ? Outpatient autologous stem cell transplant [facility], [name]); Day 0 ? ?  ?05/18/2015 Miscellaneous  ? WBC engraftment;  did not require platelet transfusion during her transplant process. Lowest platelet count 28,000  ? ?  ?05/20/2015 Procedure  ? Tunneled catheter removed [facility]) ? ?  ?09/08/2015 -  Chemotherapy  ? Velcade every 2 weeks ? ?  ?12/15/2015 Miscellaneous  ? Zometa re-instituted.  ? ?  ?12/23/2015 Imaging  ? Bone density- BMD as determined from [facility] 33% is 0.799 g/cm2 with a ?T-Score of 1.2. This patient is considered normal according to World ?Health Organization [facility]) criteria. ?  ?04/17/2016 Miscellaneous  ? Started maintenance with Ninlaro. ? ?  ?05/04/2016 Treatment Plan Change  ? Zometa switched to Xgeva injection monthly given difficult IV access.  ? ?  ?03/05/2019 - 08/06/2019 Chemotherapy  ? The patient had carfilzomib (KYPROLIS) 40 mg in dextrose 5 % 50 mL chemo infusion, 18 mg/m2 = 44 mg, Intravenous,  Once, 6 of 6 cycles ?Dose modification: 56 mg/m2 (original dose 56 mg/m2, Cycle 1, Reason: Provider Judgment) ?Administration: 40 mg (03/05/2019), 120 mg (03/12/2019), 120 mg (03/20/2019), 120 mg (04/02/2019), 120 mg (04/09/2019), 120 mg (04/16/2019),  120 mg (04/30/2019), 120 mg (05/08/2019), 120 mg (05/14/2019), 120 mg (05/28/2019), 120 mg (06/04/2019), 120 mg (06/11/2019), 120 mg (06/25/2019), 120 mg (07/02/2019), 120 mg (07/09/2019), 120 mg (07/23/2019), 120 mg (07/30/2019), 120 mg (08/06/2019) ? ? for chemotherapy treatment.  ? ?  ? ? ?CANCER STAGING: ?Cancer Staging  ?Multiple myeloma ([place]) ?Staging form: Multiple Myeloma, AJCC 6th Edition ?- Clinical stage from 10/26/2014: Stage IIA -  Signed by Baird Cancer, PA-C on 10/26/2014 ?- Pathologic: No stage assigned - Unsigned ? ? ?INTERVAL HISTORY:  ?[name], a 66 y.o. female, returns for routine follow-up of her multiple myeloma. [name] was last seen on 03/16/2021.  ? ?Today she reports feeling good. Se reports the numbness and throbbing in her hands and feet has not changed. She is taking Pomalyst and tolerating it well. She denies recent infections, and she reports the swellings in her legs are well controlled. She denies diarrhea and constipation.  ? ?REVIEW OF SYSTEMS:  ?Review of Systems  ?Respiratory:  Positive for shortness of breath.   ?Gastrointestinal:  Negative for constipation and diarrhea.  ?Musculoskeletal:  Positive for arthralgias (6/10 knees).  ?Neurological:  Positive for numbness (stable).  ?All other systems reviewed and are negative. ? ?PAST MEDICAL/SURGICAL HISTORY:  ?Past Medical History:  ?Diagnosis Date  ? Anemia   ? Arthritis   ? Cancer [facility])   ? Claustrophobia 10/05/2014  ? Hypertension   ? Leukopenia 08/03/2014  ? Normocytic hypochromic anemia 08/03/2014  ? Renal disorder   ? stage 3   ? ?Past Surgical History:  ?Procedure Laterality Date  ? ABDOMINAL HYSTERECTOMY    ? CARDIAC SURGERY    ? 47 months old. States she had a leaky valve.   ? COLONOSCOPY  03/26/2009  ? Dr. Oneida Alar; normal colon, small internal hemorrhoids.  Recommended repeat colonoscopy in 10 years.  ? COLONOSCOPY WITH PROPOFOL N/A 11/25/2019  ? Procedure: COLONOSCOPY WITH PROPOFOL;  Surgeon: Eloise Harman, DO;  [facility];  Service: Endoscopy;  Laterality: N/A;  10:45am  ? OTHER SURGICAL HISTORY    ? heart surgery as infant to "repair hole in heart"  ? PORTACATH PLACEMENT Left 02/21/2019  ? Procedure: INSERTION PORT-A-CATH;  Surgeon: Virl Cagey, MD;  Location: [facility] ORS;  Service: General;  Laterality: Left;  ? ? ?SOCIAL HISTORY:  ?Social History  ? ?Socioeconomic History  ? Marital status: Legally Separated  ?  Spouse name:  Not on file  ? Number of children: Not on file  ? Years of education: Not on file  ? Highest education level: Not on file  ?Occupational History  ? Not on file  ?Tobacco Use  ? Smoking status: Never  ? Smokeless tobacco: Never  ?Vaping Use  ? Vaping Use: Never used  ?Substance and Sexual Activity  ? Alcohol use: No  ? Drug use: No  ? Sexual activity: Not on file  ?  Comment: divorced- 2 daughters  ?Other Topics Concern  ? Not on file  ?Social History Narrative  ? Not on file  ? ?Social Determinants of Health  ? ?Financial Resource Strain: Not on file  ?Food Insecurity: Not on file  ?Transportation Needs: Not on file  ?Physical Activity: Not on file  ?Stress: Not on file  ?Social Connections: Not on file  ?Intimate Partner Violence: Not on file  ? ? ?FAMILY HISTORY:  ?Family History  ?Problem Relation Age of Onset  ? Cancer Mother   ? Hypertension Mother   ? Cancer Father   ?  Hypertension Father   ? Cancer Maternal Grandmother   ? Hypertension Sister   ? Hypertension Brother   ? Prostate cancer Brother   ? Colon cancer Neg Hx   ? Colon polyps Neg Hx   ? ? ?CURRENT MEDICATIONS:  ?Current Outpatient Medications  ?Medication Sig Dispense Refill  ? acetaminophen (TYLENOL) 650 MG CR tablet Take 650 mg by mouth every 8 (eight) hours as needed for pain.    ? acyclovir (ZOVIRAX) 400 MG tablet Take 1 tablet (400 mg total) by mouth 2 (two) times daily. 60 tablet 6  ? albuterol (VENTOLIN HFA) 108 (90 Base) MCG/ACT inhaler Inhale 2 puffs into the lungs every 6 (six) hours as needed for wheezing or shortness of breath. 8 g 3  ? albuterol (VENTOLIN HFA) 108 (90 Base) MCG/ACT inhaler 2 puffs as needed    ? ALPRAZolam (XANAX) 1 MG tablet Take 1 mg by mouth daily as needed.    ? Ascorbic Acid (VITAMIN C) 1000 MG tablet Take 1,000 mg by mouth daily.     ? aspirin EC 81 MG tablet Take 81 mg by mouth daily.    ? calcitRIOL (ROCALTROL) 0.25 MCG capsule Take by mouth.    ? calcium carbonate (OS-CAL) 1250 (500 Ca) MG chewable tablet Chew 1  tablet by mouth daily.     ? cholecalciferol (VITAMIN D) 1000 units tablet Take 1,000 Units by mouth daily.    ? dapagliflozin propanediol (FARXIGA) 10 MG TABS tablet Take 1 tablet (10 mg total) by mo

## 2021-06-22 NOTE — Patient Instructions (Signed)
[place] at [facility] ?Discharge Instructions ? ? ?You were seen and examined today by Dr. Delton Coombes. ? ?He reviewed your myeloma labs which are stable.  ? ?Continue Pomalyst as prescribed.  ? ?Continue gabapentin as prescribed. Continue to use the glove for your right hand to help with neuropathy. There are also special socks that you can order for the neuropathic pain in your feet.  ? ?Return as scheduled.  ? ? ?Thank you for choosing [place] at [facility] to provide your oncology and hematology care.  To afford each patient quality time with our provider, please arrive at least 15 minutes before your scheduled appointment time.  ? ?If you have a lab appointment with the [facility] please come in thru the Main Entrance and check in at the main information desk. ? ?You need to re-schedule your appointment should you arrive 10 or more minutes late.  We strive to give you quality time with our providers, and arriving late affects you and other patients whose appointments are after yours.  Also, if you no show three or more times for appointments you may be dismissed from the clinic at the providers discretion.     ?Again, thank you for choosing [facility].  Our hope is that these requests will decrease the amount of time that you wait before being seen by our physicians.       ?_____________________________________________________________ ? ?Should you have questions after your visit to [facility], please contact our office at [phone] and follow the prompts.  Our office hours are 8:00 a.m. and 4:30 p.m. Monday - Friday.  Please note that voicemails left after 4:00 p.m. may not be returned until the following business day.  We are closed weekends and major holidays.  You do have access to a nurse 24-7, just call the main number to the clinic [phone] and do not press any options, hold on the line and a nurse will answer  the phone.   ? ?For prescription refill requests, have your pharmacy contact our office and allow 72 hours.   ? ?Due to Covid, you will need to wear a mask upon entering the hospital. If you do not have a mask, a mask will be given to you at the Main Entrance upon arrival. For doctor visits, patients may have 1 support person age 33 or older with them. For treatment visits, patients can not have anyone with them due to social distancing guidelines and our immunocompromised population.  ? ?   ?

## 2021-06-22 NOTE — Progress Notes (Signed)
Patient is taking Pomalys as prescribed.  She has not missed any doses and reports no side effects at this time.   ?

## 2021-06-27 ENCOUNTER — Other Ambulatory Visit (HOSPITAL_COMMUNITY): Payer: Self-pay

## 2021-06-27 MED ORDER — POMALIDOMIDE 4 MG PO CAPS: 4.0000 mg | ORAL_CAPSULE | Freq: Every day | ORAL | 0 refills | Status: DC

## 2021-06-27 NOTE — Telephone Encounter (Signed)
Chart reviewed. Pomalyst refilled per last office note with Dr. Katragadda.  

## 2021-06-28 ENCOUNTER — Other Ambulatory Visit (HOSPITAL_COMMUNITY): Payer: Self-pay

## 2021-06-28 DIAGNOSIS — G8929 Other chronic pain: Secondary | ICD-10-CM

## 2021-06-28 MED ORDER — OXYCODONE-ACETAMINOPHEN 5-325 MG PO TABS: 1.0000 | ORAL_TABLET | Freq: Two times a day (BID) | ORAL | 0 refills | Status: DC | PRN

## 2021-06-29 ENCOUNTER — Ambulatory Visit (INDEPENDENT_AMBULATORY_CARE_PROVIDER_SITE_OTHER): Payer: Medicare Other | Admitting: Pulmonary Disease

## 2021-06-29 ENCOUNTER — Encounter: Payer: Self-pay | Admitting: Pulmonary Disease

## 2021-06-29 VITALS — BP 132/84 | HR 82 | Temp 98.7°F | Ht 62.0 in | Wt 340.2 lb

## 2021-06-29 DIAGNOSIS — G4733 Obstructive sleep apnea (adult) (pediatric): Secondary | ICD-10-CM | POA: Diagnosis not present

## 2021-06-29 DIAGNOSIS — G4734 Idiopathic sleep related nonobstructive alveolar hypoventilation: Secondary | ICD-10-CM

## 2021-06-29 NOTE — Patient Instructions (Signed)
  X split study 

## 2021-06-29 NOTE — Progress Notes (Signed)
Subjective:    Patient ID: Kelli Hurley, female    DOB: Mar 03, 1955, 66 y.o.   MRN: [id number]  HPI  66 year old morbidly obese never smoker referred for evaluation of sleep disordered breathing  PMH -multiple myeloma status post bone marrow transplantation 2017, on Pomalyst -HFpEF -CKD stage II   She has been evaluated for dyspnea on exertion by my partner Dr. Melvyn Novas and this was attributed to obesity.  She does have a history of chronic diastolic heart failure and is on diuretics.  Cardiac MRI has not shown any other cause such as sarcoidosis.  She has been noted to have mild desaturation on exertion, severe desaturation during sleep and has been on nocturnal oxygen since 2021.  She denies using oxygen in the daytime  Epworth sleepiness score is 7 and she reports some sleepiness while sitting and reading, watching TV, sitting inactive in a public place.  She admits to being TV junkie .  Bedtime is around 11 PM, TV stays on through the night, she sleeps on her side with 1 pillow, reports 2-3 nocturnal awakenings including nocturia and is out of bed by 8 AM feeling rested with dryness of mouth but denies headaches. There are occasional naps during the day There is no history suggestive of cataplexy, sleep paralysis or parasomnias  She has gained 10 pounds in the last 2 years She lives by herself, no bed partner history is available, family members have noted loud snoring  Significant tests/ events reviewed  07/26/2020   Walked RA  approx   200 ft  @ slow/ waddling pace  stopped due to  Sob and sats 87% "much more than usual activity" - Echo 02/19/19 nl x for mild LAE  - CTa 02/24/19 no large PE, no ILD  - Neg MR cardiac 03/07/19 neg for amyloid, nl ef  - PFT's  05/20/19   FEV1 1.92 (103 % ) ratio 0.91  p 6 % improvement from saba p nothing prior to study with DLCO  11.73 (62%) corrects to 3.66 (85 %)  for alv volume and FV curve nl and ERV 36%    08/2019 ONO slept for 8 hours and 43  minutes, testing showing time spent below 88% 4 hours and 6 minutes, lowest oxygen level appears to be 68% rec 2lpm hs    Past Medical History:  Diagnosis Date   Anemia    Arthritis    Cancer ([place])    Claustrophobia 10/05/2014   Hypertension    Leukopenia 08/03/2014   Normocytic hypochromic anemia 08/03/2014   Renal disorder    stage 3    Past Surgical History:  Procedure Laterality Date   ABDOMINAL HYSTERECTOMY     CARDIAC SURGERY     47 months old. States she had a leaky valve.    COLONOSCOPY  03/26/2009   Dr. Oneida Alar; normal colon, small internal hemorrhoids.  Recommended repeat colonoscopy in 10 years.   COLONOSCOPY WITH PROPOFOL N/A 11/25/2019   Procedure: COLONOSCOPY WITH PROPOFOL;  Surgeon: Eloise Harman, DO;  Location[facility];  Service: Endoscopy;  Laterality: N/A;  10:45am   OTHER SURGICAL HISTORY     heart surgery as infant to "repair hole in heart"   PORTACATH PLACEMENT Left 02/21/2019   Procedure: INSERTION PORT-A-CATH;  Surgeon: Virl Cagey, MD;  Location: [facility] ORS;  Service: General;  Laterality: Left;    Allergies  Allergen Reactions   Diclofenac Swelling    Per pt, facial swelling    Social History  Socioeconomic History   Marital status: Legally Separated    Spouse name: Not on file   Number of children: Not on file   Years of education: Not on file   Highest education level: Not on file  Occupational History   Not on file  Tobacco Use   Smoking status: Never   Smokeless tobacco: Never  Vaping Use   Vaping Use: Never used  Substance and Sexual Activity   Alcohol use: No   Drug use: No   Sexual activity: Not on file    Comment: divorced- 2 daughters  Other Topics Concern   Not on file  Social History Narrative   Not on file   Social Determinants of Health   Financial Resource Strain: Not on file  Food Insecurity: Not on file  Transportation Needs: Not on file  Physical Activity: Not on file  Stress: Not on file  Social  Connections: Not on file  Intimate Partner Violence: Not on file    Family History  Problem Relation Age of Onset   Cancer Mother    Hypertension Mother    Cancer Father    Hypertension Father    Cancer Maternal Grandmother    Hypertension Sister    Hypertension Brother    Prostate cancer Brother    Colon cancer Neg Hx    Colon polyps Neg Hx      Review of Systems  Shortness of breath with activity Joint stiffness Leg swelling  Constitutional: negative for anorexia, fevers and sweats  Eyes: negative for irritation, redness and visual disturbance  Ears, nose, mouth, throat, and face: negative for earaches, epistaxis, nasal congestion and sore throat  Respiratory: negative for cough, sputum and wheezing  Cardiovascular: negative for chest pain,orthopnea, palpitations and syncope  Gastrointestinal: negative for abdominal pain, constipation, diarrhea, melena, nausea and vomiting  Genitourinary:negative for dysuria, frequency and hematuria  Hematologic/lymphatic: negative for bleeding, easy bruising and lymphadenopathy  Musculoskeletal:negative for arthralgias, muscle weakness and stiff joints  Neurological: negative for coordination problems, gait problems, headaches and weakness  Endocrine: negative for diabetic symptoms including polydipsia, polyuria and weight loss     Objective:   Physical Exam  Gen. Pleasant, obese, in no distress, normal affect ENT - no pallor,icterus, no post nasal drip, class 2-3 airway Neck: No JVD, no thyromegaly, no carotid bruits Lungs: no use of accessory muscles, no dullness to percussion, decreased without rales or rhonchi  Cardiovascular: Rhythm regular, heart sounds  normal, no murmurs or gallops, 2+ peripheral edema Abdomen: soft and non-tender, no hepatosplenomegaly, BS normal. Musculoskeletal: No deformities, no cyanosis or clubbing Neuro:  alert, non focal, no tremors       Assessment & Plan:

## 2021-06-29 NOTE — Assessment & Plan Note (Signed)
Given excessive daytime somnolence, narrow pharyngeal exam, witnessed apneas & loud snoring, obstructive sleep apnea is very likely & an overnight polysomnogram will be scheduled as a split study. The pathophysiology of obstructive sleep apnea , it's cardiovascular consequences & modes of treatment including CPAP were discused with the patient in detail & they evidenced understanding.  Since she is on nocturnal oxygen, home sleep test cannot be performed and we will proceed with split polysomnogram

## 2021-06-29 NOTE — Assessment & Plan Note (Signed)
Previous nocturnal oximetry was reviewed Severe desaturations have been noted. Split study will help [place] clarify whether this is due to OSA and if so, whether CPAP alone would control or whether oxygen would be needed in addition to PAP

## 2021-07-12 ENCOUNTER — Other Ambulatory Visit (HOSPITAL_COMMUNITY): Payer: Self-pay | Admitting: Hematology

## 2021-07-19 ENCOUNTER — Telehealth: Payer: Self-pay | Admitting: Student-PharmD

## 2021-07-19 NOTE — Telephone Encounter (Signed)
Called patient to follow up on how she is doing with Ozempic.At last contact last month, patient was still taking 0.5 mg and was instructed to finish her current supply then increase to 1 mg. She was confused previously about the dose titration due to the pharmacy continuing to send her 0.25/0.5 mg pens.   Today, she reports doing well with Ozempic but states she has 2 boxes of 0.5 mg (one which she will start today) and 1 box of 1 mg at home. The 0.5 mg pens are coming from [facility] (mail order) who keeps sending them to her. The patient says she knows she is supposed to go up to 1 mg but wants to use up her supply of 0.5 mg first. She thinks she has lost about 5 lbs since she started taking Ozempic. She is having no adverse effects and would like to continue to increase the dose to better improve weight loss. I offered to call the mail order pharmacy to ask them to stop sending her Ozempic but the patient says she will call today to do this. She got the 1 mg pen from [facility], not from mail order, and would like to have future prescriptions sent to [facility].   Discussed options with patient to use up her current supply. She is agreeable to increasing to 1 mg (via two 0.5 mg injections) weekly for the next 4 weeks, which would use up her current two boxes of 0.5 mg pens, then continue 1 mg (1 injection) using the 1 mg pen. She confirms understanding of this plan.   Given previous confusion with dose changes, will reach out to her in 4 weeks to confirm she is taking Ozempic as discussed and to confirm she asked the mail order pharmacy to stop sending 0.5 mg boxes. At that time, she should be switching to the 1 mg pen, then we can continue to increase to 2 mg as tolerated.

## 2021-07-22 ENCOUNTER — Ambulatory Visit: Payer: Medicare Other | Attending: Pulmonary Disease | Admitting: Pulmonary Disease

## 2021-07-22 DIAGNOSIS — G4734 Idiopathic sleep related nonobstructive alveolar hypoventilation: Secondary | ICD-10-CM | POA: Diagnosis not present

## 2021-07-22 DIAGNOSIS — G4736 Sleep related hypoventilation in conditions classified elsewhere: Secondary | ICD-10-CM | POA: Insufficient documentation

## 2021-07-22 DIAGNOSIS — G4733 Obstructive sleep apnea (adult) (pediatric): Secondary | ICD-10-CM | POA: Insufficient documentation

## 2021-07-28 ENCOUNTER — Other Ambulatory Visit (HOSPITAL_COMMUNITY): Payer: Self-pay

## 2021-07-28 MED ORDER — POMALIDOMIDE 4 MG PO CAPS: 4.0000 mg | ORAL_CAPSULE | Freq: Every day | ORAL | 0 refills | Status: DC

## 2021-07-28 NOTE — Telephone Encounter (Signed)
Chart reviewed. Pomalyst refilled per last office note with Dr. Katragadda.  

## 2021-07-29 ENCOUNTER — Other Ambulatory Visit (HOSPITAL_COMMUNITY): Payer: Self-pay

## 2021-07-29 DIAGNOSIS — G8929 Other chronic pain: Secondary | ICD-10-CM

## 2021-07-29 MED ORDER — OXYCODONE-ACETAMINOPHEN 5-325 MG PO TABS: 1.0000 | ORAL_TABLET | Freq: Two times a day (BID) | ORAL | 0 refills | Status: DC | PRN

## 2021-08-02 NOTE — Procedures (Signed)
Patient Name: Kelli Hurley, Kelli Hurley Date: 07/22/2021 Gender: Female D.O.B: 05/16/1955 Age (years): 4 Referring Provider: Kara Mead MD, ABSM Height (inches): 62 Interpreting Physician: Kara Mead MD, ABSM Weight (lbs): 340 RPSGT: Rosebud Poles BMI: 62 MRN: [id number] Neck Size: <br> <br> <br> CLINICAL INFORMATION The patient is referred for a split night study . MEDICATIONS Medications self-administered by patient taken the night of the study : N/A  SLEEP STUDY TECHNIQUE As per the AASM Manual for the Scoring of Sleep and Associated Events v2.3 (April 2016) with a hypopnea requiring 4% desaturations.  The channels recorded and monitored were frontal, central and occipital EEG, electrooculogram (EOG), submentalis EMG (chin), nasal and oral airflow, thoracic and abdominal wall motion, anterior tibialis EMG, snore microphone, electrocardiogram, and pulse oximetry. Bi-level positive airway pressure (BiPAP) was initiated when the patient met split night criteria and was titrated according to treat sleep-disordered breathing.  RESPIRATORY PARAMETERS Diagnostic  Total AHI (/hr): 21.0 RDI (/hr): 21.0 OA Index (/hr): 4.5 CA Index (/hr): 0.0 REM AHI (/hr): 83.7 NREM AHI (/hr): 12.9 Supine AHI (/hr): 21.0 Non-supine AHI (/hr): N/A Min O2 Sat (%): 65.00 Mean O2 (%): 86.74 Time below 88% (min): 124.5   Titration  Optimal IPAP Pressure (cm): 13 Optimal EPAP Pressure (cm): 9 AHI at Optimal Pressure (/hr): 2.8 Min O2 at Optimal Pressure (%): 65.0 Sleep % at Optimal (%): 75 Supine % at Optimal (%): 0     SLEEP ARCHITECTURE The study was initiated at 9:39:06 PM and terminated at 5:17:28 AM. The total recorded time was 458.4 minutes. EEG confirmed total sleep time was 373.6 minutes yielding a sleep efficiency of 81.5%. Sleep onset after lights out was 16.3 minutes with a REM latency of 112.0 minutes. The patient spent 3.88% of the night in stage N1 sleep, 75.64% in stage N2 sleep, 0.40% in  stage N3 and 20.1% in REM. Wake after sleep onset (WASO) was 68.5 minutes. The Arousal Index was 9.5/hour.  LEG MOVEMENT DATA The total Periodic Limb Movements of Sleep (PLMS) were 210. The PLMS index was 33.73 .  CARDIAC DATA The 2 lead EKG demonstrated sinus rhythm. The mean heart rate was 71.21 beats per minute. Other EKG findings include: PVCs.    IMPRESSIONS - Moderate obstructive sleep apnea occurred during the diagnostic portion of the study (AHI = 21.0 /hour). She could not tolerate higher pressure on CPAP due to dificulty exhaling. An optimal PAP pressure was selected for this patient ( 13 /9 cm of water) - Severe oxygen desaturation was noted during the diagnostic portion of the study (Min O2 = 65.00%). 2L of O2 was blended due to persistent hypoxia on bipap inspite of control of events - The patient snored with loud snoring volume during the diagnostic portion of the study. - EKG findings include PVCs. - Severe periodic limb movements of sleep occurred during the study. These were corrected wiht PAP   DIAGNOSIS - Obstructive Sleep Apnea (G47.33) - Nocturnal Hypoxemia (G47.36)   RECOMMENDATIONS - Trial of BiPAP therapy on 13/9 cm H2O with a Wide size Fisher&Paykel Nasal [place] (Nasal) mask and heated humidification. - 2L of O2 should be blended in - Avoid alcohol, sedatives and other CNS depressants that may worsen sleep apnea and disrupt normal sleep architecture. - Sleep hygiene should be reviewed to assess factors that may improve sleep quality. - Weight management and regular exercise should be initiated or continued. - Return to Sleep Center for re-evaluation.    Kara Mead MD Board Certified in [place]

## 2021-08-04 ENCOUNTER — Other Ambulatory Visit: Payer: Self-pay

## 2021-08-04 DIAGNOSIS — G4733 Obstructive sleep apnea (adult) (pediatric): Secondary | ICD-10-CM

## 2021-08-10 ENCOUNTER — Telehealth: Payer: Self-pay | Admitting: Pulmonary Disease

## 2021-08-11 NOTE — Telephone Encounter (Signed)
Spoke with Mariann Laster at [facility] to verify the fax number. Sleep study faxed. Nothing further needed.

## 2021-08-16 ENCOUNTER — Telehealth: Payer: Self-pay | Admitting: Pharmacist

## 2021-08-16 MED ORDER — OZEMPIC (2 MG/DOSE) 8 MG/3ML ~~LOC~~ SOPN: 2.0000 mg | PEN_INJECTOR | SUBCUTANEOUS | 11 refills | Status: DC

## 2021-08-16 NOTE — Telephone Encounter (Signed)
Called pt to follow up with Ozempic titration. Pt states she's still using 2 of the 0.'5mg'$  pens she had and is tolerating it well, then will change over to her '1mg'$  pen and use this for a month. Tolerating med well. Reports pain in her arm that she attributes to Ozempic, advised her that Ozempic is not known to cause arm pain. She is giving the injections in her stomach. Will send in refill for maintenance '2mg'$  weekly dose for pt to pick up after she finishes her '1mg'$  weekly dosing. She's aware to call clinic with any concerns.

## 2021-08-17 ENCOUNTER — Other Ambulatory Visit: Payer: Self-pay | Admitting: Internal Medicine

## 2021-08-23 ENCOUNTER — Other Ambulatory Visit: Payer: Self-pay | Admitting: Internal Medicine

## 2021-08-25 ENCOUNTER — Ambulatory Visit (INDEPENDENT_AMBULATORY_CARE_PROVIDER_SITE_OTHER): Payer: Medicare Other | Admitting: Orthopedic Surgery

## 2021-08-25 ENCOUNTER — Encounter: Payer: Self-pay | Admitting: Orthopedic Surgery

## 2021-08-25 ENCOUNTER — Other Ambulatory Visit (HOSPITAL_COMMUNITY): Payer: Self-pay

## 2021-08-25 DIAGNOSIS — M25562 Pain in left knee: Secondary | ICD-10-CM

## 2021-08-25 DIAGNOSIS — M25561 Pain in right knee: Secondary | ICD-10-CM | POA: Diagnosis not present

## 2021-08-25 DIAGNOSIS — G8929 Other chronic pain: Secondary | ICD-10-CM

## 2021-08-25 MED ORDER — METHYLPREDNISOLONE ACETATE 40 MG/ML IJ SUSP
40.0000 mg | Freq: Once | INTRAMUSCULAR | Status: AC
  Administered 2021-08-25: 40 mg via INTRA_ARTICULAR

## 2021-08-25 MED ORDER — OXYCODONE-ACETAMINOPHEN 5-325 MG PO TABS: 1.0000 | ORAL_TABLET | Freq: Two times a day (BID) | ORAL | 0 refills | Status: DC | PRN

## 2021-08-25 NOTE — Patient Instructions (Signed)

## 2021-08-25 NOTE — Progress Notes (Signed)
Chief Complaint  Patient presents with   Injections    Both knees     Encounter Diagnoses  Name Primary?   Chronic pain of left knee Yes   Chronic pain of right knee    There is no height or weight on file to calculate BMI. Wt 340lbs   She is not a surgical candidate   She is ok for injections   Procedure note for bilateral knee injections  Procedure note left knee injection verbal consent was obtained to inject left knee joint  Timeout was completed to confirm the site of injection  The medications used were 40 mg depomedrol and 3 cc of 1% lidocaine  Anesthesia was provided by ethyl chloride and the skin was prepped with alcohol.  After cleaning the skin with alcohol a 20-gauge needle was used to inject the left knee joint. There were no complications. A sterile bandage was applied.   Procedure note right knee injection verbal consent was obtained to inject right knee joint  Timeout was completed to confirm the site of injection  The medications used were 40 mg depomedrol and 3 cc of 1% lidocaine  Anesthesia was provided by ethyl chloride and the skin was prepped with alcohol.  After cleaning the skin with alcohol a 20-gauge needle was used to inject the right knee joint. There were no complications. A sterile bandage was applied.

## 2021-08-30 ENCOUNTER — Other Ambulatory Visit (HOSPITAL_COMMUNITY): Payer: Self-pay

## 2021-08-30 MED ORDER — POMALIDOMIDE 4 MG PO CAPS: 4.0000 mg | ORAL_CAPSULE | Freq: Every day | ORAL | 0 refills | Status: DC

## 2021-08-30 NOTE — Telephone Encounter (Signed)
Chart reviewed. Pomalyst refilled per last office note with Dr. Katragadda.  

## 2021-09-06 ENCOUNTER — Other Ambulatory Visit: Payer: Self-pay | Admitting: Hematology

## 2021-09-14 ENCOUNTER — Inpatient Hospital Stay: Payer: Medicare Other | Attending: Hematology

## 2021-09-14 DIAGNOSIS — R609 Edema, unspecified: Secondary | ICD-10-CM | POA: Diagnosis not present

## 2021-09-14 DIAGNOSIS — M25561 Pain in right knee: Secondary | ICD-10-CM | POA: Diagnosis not present

## 2021-09-14 DIAGNOSIS — M2550 Pain in unspecified joint: Secondary | ICD-10-CM | POA: Insufficient documentation

## 2021-09-14 DIAGNOSIS — Z79899 Other long term (current) drug therapy: Secondary | ICD-10-CM | POA: Insufficient documentation

## 2021-09-14 DIAGNOSIS — M25562 Pain in left knee: Secondary | ICD-10-CM | POA: Insufficient documentation

## 2021-09-14 DIAGNOSIS — E876 Hypokalemia: Secondary | ICD-10-CM | POA: Insufficient documentation

## 2021-09-14 DIAGNOSIS — Z8249 Family history of ischemic heart disease and other diseases of the circulatory system: Secondary | ICD-10-CM | POA: Diagnosis not present

## 2021-09-14 DIAGNOSIS — E669 Obesity, unspecified: Secondary | ICD-10-CM | POA: Diagnosis not present

## 2021-09-14 DIAGNOSIS — Z809 Family history of malignant neoplasm, unspecified: Secondary | ICD-10-CM | POA: Insufficient documentation

## 2021-09-14 DIAGNOSIS — N1832 Chronic kidney disease, stage 3b: Secondary | ICD-10-CM | POA: Diagnosis not present

## 2021-09-14 DIAGNOSIS — G629 Polyneuropathy, unspecified: Secondary | ICD-10-CM | POA: Diagnosis not present

## 2021-09-14 DIAGNOSIS — C9000 Multiple myeloma not having achieved remission: Secondary | ICD-10-CM | POA: Diagnosis not present

## 2021-09-14 DIAGNOSIS — Z8042 Family history of malignant neoplasm of prostate: Secondary | ICD-10-CM | POA: Diagnosis not present

## 2021-09-14 DIAGNOSIS — R200 Anesthesia of skin: Secondary | ICD-10-CM | POA: Diagnosis not present

## 2021-09-14 DIAGNOSIS — R0602 Shortness of breath: Secondary | ICD-10-CM | POA: Insufficient documentation

## 2021-09-14 LAB — COMPREHENSIVE METABOLIC PANEL
ALT: 14 U/L (ref 0–44)
AST: 14 U/L — ABNORMAL LOW (ref 15–41)
Albumin: 3.8 g/dL (ref 3.5–5.0)
Alkaline Phosphatase: 48 U/L (ref 38–126)
Anion gap: 11 (ref 5–15)
BUN: 46 mg/dL — ABNORMAL HIGH (ref 8–23)
CO2: 30 mmol/L (ref 22–32)
Calcium: 9.8 mg/dL (ref 8.9–10.3)
Chloride: 95 mmol/L — ABNORMAL LOW (ref 98–111)
Creatinine, Ser: 1.66 mg/dL — ABNORMAL HIGH (ref 0.44–1.00)
GFR, Estimated: 34 mL/min — ABNORMAL LOW (ref >60)
Glucose, Bld: 137 mg/dL — ABNORMAL HIGH (ref 70–99)
Potassium: 3.4 mmol/L — ABNORMAL LOW (ref 3.5–5.1)
Sodium: 136 mmol/L (ref 135–145)
Total Bilirubin: 0.5 mg/dL (ref 0.3–1.2)
Total Protein: 7.2 g/dL (ref 6.5–8.1)

## 2021-09-14 LAB — CBC WITH DIFFERENTIAL/PLATELET
Abs Immature Granulocytes: 0.31 10*3/uL — ABNORMAL HIGH (ref 0.00–0.07)
Basophils Absolute: 0 10*3/uL (ref 0.0–0.1)
Basophils Relative: 0 %
Eosinophils Absolute: 0 10*3/uL (ref 0.0–0.5)
Eosinophils Relative: 0 %
HCT: 33.2 % — ABNORMAL LOW (ref 36.0–46.0)
Hemoglobin: 10.6 g/dL — ABNORMAL LOW (ref 12.0–15.0)
Immature Granulocytes: 3 %
Lymphocytes Relative: 11 %
Lymphs Abs: 1 10*3/uL (ref 0.7–4.0)
MCH: 29.7 pg (ref 26.0–34.0)
MCHC: 31.9 g/dL (ref 30.0–36.0)
MCV: 93 fL (ref 80.0–100.0)
Monocytes Absolute: 0.6 10*3/uL (ref 0.1–1.0)
Monocytes Relative: 6 %
Neutro Abs: 7.5 10*3/uL (ref 1.7–7.7)
Neutrophils Relative %: 80 %
Platelets: 285 10*3/uL (ref 150–400)
RBC: 3.57 MIL/uL — ABNORMAL LOW (ref 3.87–5.11)
RDW: 14.9 % (ref 11.5–15.5)
WBC: 9.5 10*3/uL (ref 4.0–10.5)
nRBC: 0 % (ref 0.0–0.2)

## 2021-09-14 LAB — LACTATE DEHYDROGENASE: LDH: 168 U/L (ref 98–192)

## 2021-09-14 LAB — MAGNESIUM: Magnesium: 2.3 mg/dL (ref 1.7–2.4)

## 2021-09-14 MED ORDER — SODIUM CHLORIDE 0.9% FLUSH
10.0000 mL | Freq: Once | INTRAVENOUS | Status: AC
  Administered 2021-09-14: 10 mL via INTRAVENOUS

## 2021-09-14 MED ORDER — HEPARIN SOD (PORK) LOCK FLUSH 100 UNIT/ML IV SOLN
500.0000 [IU] | Freq: Once | INTRAVENOUS | Status: AC
  Administered 2021-09-14: 500 [IU] via INTRAVENOUS

## 2021-09-14 NOTE — Progress Notes (Signed)
Patients port flushed without difficulty.  Good blood return noted with no bruising or swelling noted at site.  Band aid applied.  VSS with discharge and left in satisfactory condition with no s/s of distress noted.   

## 2021-09-14 NOTE — Patient Instructions (Signed)
[facility]  Discharge Instructions: Thank you for choosing [facility] to provide your oncology and hematology care.  If you have a lab appointment with the [facility], please come in thru the Main Entrance and check in at the main information desk.  Wear comfortable clothing and clothing appropriate for easy access to any Portacath or PICC line.   We strive to give you quality time with your provider. You may need to reschedule your appointment if you arrive late (15 or more minutes).  Arriving late affects you and other patients whose appointments are after yours.  Also, if you miss three or more appointments without notifying the office, you may be dismissed from the clinic at the provider's discretion.      For prescription refill requests, have your pharmacy contact our office and allow 72 hours for refills to be completed.    Today you received the following chemotherapy and/or immunotherapy agents Port flush      To help prevent nausea and vomiting after your treatment, we encourage you to take your nausea medication as directed.  BELOW ARE SYMPTOMS THAT SHOULD BE REPORTED IMMEDIATELY: *FEVER GREATER THAN 100.4 F (38 C) OR HIGHER *CHILLS OR SWEATING *NAUSEA AND VOMITING THAT IS NOT CONTROLLED WITH YOUR NAUSEA MEDICATION *UNUSUAL SHORTNESS OF BREATH *UNUSUAL BRUISING OR BLEEDING *URINARY PROBLEMS (pain or burning when urinating, or frequent urination) *BOWEL PROBLEMS (unusual diarrhea, constipation, pain near the anus) TENDERNESS IN MOUTH AND THROAT WITH OR WITHOUT PRESENCE OF ULCERS (sore throat, sores in mouth, or a toothache) UNUSUAL RASH, SWELLING OR PAIN  UNUSUAL VAGINAL DISCHARGE OR ITCHING   Items with * indicate a potential emergency and should be followed up as soon as possible or go to the Emergency Department if any problems should occur.  Please show the CHEMOTHERAPY ALERT CARD or IMMUNOTHERAPY ALERT CARD at check-in to the  Emergency Department and triage nurse.  Should you have questions after your visit or need to cancel or reschedule your appointment, please contact [facility] [phone]  and follow the prompts.  Office hours are 8:00 a.m. to 4:30 p.m. Monday - Friday. Please note that voicemails left after 4:00 p.m. may not be returned until the following business day.  We are closed weekends and major holidays. You have access to a nurse at all times for urgent questions. Please call the main number to the clinic [phone] and follow the prompts.  For any non-urgent questions, you may also contact your provider using MyChart. We now offer e-Visits for anyone 58 and older to request care online for non-urgent symptoms. For details visit [email or web address].   Also download the MyChart app! Go to the app store, search "MyChart", open the app, select [facility], and log in with your MyChart username and password.  Masks are optional in the cancer centers. If you would like for your care team to wear a mask while they are taking care of you, please let them know. For doctor visits, patients may have with them one support person who is at least 65 years old. At this time, visitors are not allowed in the infusion area.

## 2021-09-15 LAB — KAPPA/LAMBDA LIGHT CHAINS
Kappa free light chain: 24.9 mg/L — ABNORMAL HIGH (ref 3.3–19.4)
Kappa, lambda light chain ratio: 1.46 (ref 0.26–1.65)
Lambda free light chains: 17 mg/L (ref 5.7–26.3)

## 2021-09-19 LAB — IMMUNOFIXATION ELECTROPHORESIS
IgA: 137 mg/dL (ref 87–352)
IgG (Immunoglobin G), Serum: 787 mg/dL (ref 586–1602)
IgM (Immunoglobulin M), Srm: 60 mg/dL (ref 26–217)
Total Protein ELP: 6.5 g/dL (ref 6.0–8.5)

## 2021-09-19 LAB — PROTEIN ELECTROPHORESIS, SERUM
A/G Ratio: 1.2 (ref 0.7–1.7)
Albumin ELP: 3.5 g/dL (ref 2.9–4.4)
Alpha-1-Globulin: 0.3 g/dL (ref 0.0–0.4)
Alpha-2-Globulin: 0.8 g/dL (ref 0.4–1.0)
Beta Globulin: 1 g/dL (ref 0.7–1.3)
Gamma Globulin: 0.8 g/dL (ref 0.4–1.8)
Globulin, Total: 2.9 g/dL (ref 2.2–3.9)
M-Spike, %: 0.3 g/dL — ABNORMAL HIGH
Total Protein ELP: 6.4 g/dL (ref 6.0–8.5)

## 2021-09-21 ENCOUNTER — Other Ambulatory Visit (HOSPITAL_COMMUNITY): Payer: Self-pay | Admitting: Hematology

## 2021-09-21 ENCOUNTER — Other Ambulatory Visit: Payer: Self-pay

## 2021-09-21 ENCOUNTER — Inpatient Hospital Stay (HOSPITAL_BASED_OUTPATIENT_CLINIC_OR_DEPARTMENT_OTHER): Payer: Medicare Other | Admitting: Hematology

## 2021-09-21 VITALS — BP 118/68 | HR 74 | Temp 98.1°F | Resp 18 | Wt 328.0 lb

## 2021-09-21 DIAGNOSIS — C9000 Multiple myeloma not having achieved remission: Secondary | ICD-10-CM | POA: Diagnosis not present

## 2021-09-21 MED ORDER — POMALIDOMIDE 4 MG PO CAPS: 4.0000 mg | ORAL_CAPSULE | Freq: Every day | ORAL | 0 refills | Status: DC

## 2021-09-21 NOTE — Patient Instructions (Addendum)
[facility] at [facility] Discharge Instructions   You were seen and examined today by Dr. Delton Coombes.  He reviewed the results of your lab work which are normal/stable.   Return as scheduled in 3 months.    Thank you for choosing [facility] at [facility] to provide your oncology and hematology care.  To afford each patient quality time with our provider, please arrive at least 15 minutes before your scheduled appointment time.   If you have a lab appointment with the [facility] please come in thru the Main Entrance and check in at the main information desk.  You need to re-schedule your appointment should you arrive 10 or more minutes late.  We strive to give you quality time with our providers, and arriving late affects you and other patients whose appointments are after yours.  Also, if you no show three or more times for appointments you may be dismissed from the clinic at the providers discretion.     Again, thank you for choosing [facility].  Our hope is that these requests will decrease the amount of time that you wait before being seen by our physicians.       _____________________________________________________________  Should you have questions after your visit to [facility], please contact our office at [phone] and follow the prompts.  Our office hours are 8:00 a.m. and 4:30 p.m. Monday - Friday.  Please note that voicemails left after 4:00 p.m. may not be returned until the following business day.  We are closed weekends and major holidays.  You do have access to a nurse 24-7, just call the main number to the clinic [phone] and do not press any options, hold on the line and a nurse will answer the phone.    For prescription refill requests, have your pharmacy contact our office and allow 72 hours.    Due to Covid, you will need to wear a mask upon entering the hospital. If you do not have a  mask, a mask will be given to you at the Main Entrance upon arrival. For doctor visits, patients may have 1 support person age 27 or older with them. For treatment visits, patients can not have anyone with them due to social distancing guidelines and our immunocompromised population.

## 2021-09-21 NOTE — Telephone Encounter (Signed)
Chart reviewed. Pomalyst refilled per verbal order from Dr. Katragadda.  

## 2021-09-21 NOTE — Progress Notes (Signed)
[facility] [address] [place] [address]   CLINIC:  Medical Oncology/Hematology  PCP:  Alfonse Flavors, MD [address] [place] [address] [phone]   REASON FOR VISIT:  Follow-up for multiple myeloma  PRIOR THERAPY:  1. Bortezomib x 8 cycles from 10/05/2014 to 04/06/2016. 2. Carfilzomib x 6 cycles from 03/05/2019 to 08/06/2019.  NGS Results: not done  CURRENT THERAPY: Pomalyst 4 mg 3/4 weeks  BRIEF ONCOLOGIC HISTORY:  Oncology History  Multiple myeloma ([facility])  08/07/2014 Imaging   Bone Survey- No lytic lesions are noted in the visualized skeleton.   09/03/2014 Bone Marrow Biopsy   NORMOCELLULAR BONE MARROW FOR AGE WITH PLASMA CELL NEOPLASM.  The plasma cell component is increased in the marrow representing an estimated 18% of all cells. Cytogenetics with 13q-, 17p- (high risk disease)   09/03/2014 Pathology Results   Cytogenetics with 13q-, 17p- (high risk disease)   09/23/2014 Initial Diagnosis   Multiple myeloma   09/30/2014 PET scan   No abnormal hypermetabolism in the neck, chest, abdomen or pelvis.   10/05/2014 - 03/22/2015 Chemotherapy   RVD   10/28/2014 Treatment Plan Change   Issues related to getting Revlimid in a timely fashion, therefore, she received her Revlimid on 9/21 resulting in a 12 day cycle this time instead of a 14 day cycle   11/02/2014 Imaging   CTA chest- No evidence for a large or central pulmonary embolism as described.  8 mm density along the right minor fissure could represent focal pleural thickening but indeterminate. If the patient is at high risk for bronchogenic carcinoma, follow-up c   11/23/2014 Miscellaneous   Zometa 4 mg IV monthly   04/07/2015 Bone Marrow Biopsy   Normocellular marrow with 2-4% clonal plasma cells by immunohistochemistry. FISH and cytogenetics were normal [facility])     04/2015 Miscellaneous   PRETRANSPLANT EVALUATION:  Pulmonary function tests: FEV1 100.3% / DLCO 97.9%   Echocardiogram: Normal LV function with EF 60-65%    04/27/2015 Procedure   Stem cell mobilization with filgrastim and Mozobil [facility])   05/06/2015 Miscellaneous   BMT conditioning regimen with high-dose Melphalan given [name]); Day -1   05/07/2015 Bone Marrow Transplant   Outpatient autologous stem cell transplant [facility] [name]); Day 0   05/18/2015 Miscellaneous   WBC engraftment;  did not require platelet transfusion during her transplant process. Lowest platelet count 28,000    05/20/2015 Procedure   Tunneled catheter removed [facility])   09/08/2015 -  Chemotherapy   Velcade every 2 weeks   12/15/2015 Miscellaneous   Zometa re-instituted.    12/23/2015 Imaging   Bone density- BMD as determined from Forearm Radius 33% is 0.799 g/cm2 with a T-Score of 1.2. This patient is considered normal according to [facility]) criteria.   04/17/2016 Miscellaneous   Started maintenance with N[place]o.   05/04/2016 Treatment Plan Change   Zometa switched to Xgeva injection monthly given difficult IV access.    03/05/2019 - 08/06/2019 Chemotherapy   The patient had carfilzomib (KYPROLIS) 40 mg in dextrose 5 % 50 mL chemo infusion, 18 mg/m2 = 44 mg, Intravenous,  Once, 6 of 6 cycles Dose modification: 56 mg/m2 (original dose 56 mg/m2, Cycle 1, Reason: Provider Judgment) Administration: 40 mg (03/05/2019), 120 mg (03/12/2019), 120 mg (03/20/2019), 120 mg (04/02/2019), 120 mg (04/09/2019), 120 mg (04/16/2019), 120 mg (04/30/2019), 120 mg (05/08/2019), 120 mg (05/14/2019), 120 mg (05/28/2019), 120 mg (06/04/2019), 120 mg (06/11/2019), 120 mg (  06/25/2019), 120 mg (07/02/2019), 120 mg (07/09/2019), 120 mg (07/23/2019), 120 mg (07/30/2019), 120 mg (08/06/2019)  for chemotherapy treatment.      CANCER STAGING:  Cancer Staging  Multiple myeloma [facility]) Staging form: Multiple Myeloma, AJCC 6th Edition - Clinical stage from 10/26/2014: Stage IIA - Signed by Baird Cancer, PA-C on  10/26/2014 - Pathologic: No stage assigned - Unsigned   INTERVAL HISTORY:  [name], a 66 y.o. female, seen for follow-up of multiple myeloma.  She is taking Pomalyst 3 weeks on 1 week off along with weekly dexamethasone.  Arthritic pains in the knees and shoulders has been stable and controlled with pain medication.  Dyspnea on exertion is also stable.  No GI side effects from Pomalyst.  Numbness in the right hand and toes has also been stable with gabapentin.  No recent fevers or infections noted.  REVIEW OF SYSTEMS:  Review of Systems  Respiratory:  Positive for shortness of breath.   Gastrointestinal:  Negative for constipation and diarrhea.  Musculoskeletal:  Positive for arthralgias (6/10 knees).  Neurological:  Positive for numbness (stable).  All other systems reviewed and are negative.   PAST MEDICAL/SURGICAL HISTORY:  Past Medical History:  Diagnosis Date   Anemia    Arthritis    Cancer ([facility])    Claustrophobia 10/05/2014   Hypertension    Leukopenia 08/03/2014   Normocytic hypochromic anemia 08/03/2014   Renal disorder    stage 3    Past Surgical History:  Procedure Laterality Date   ABDOMINAL HYSTERECTOMY     CARDIAC SURGERY     37 months old. States she had a leaky valve.    COLONOSCOPY  03/26/2009   Dr. Oneida Alar; normal colon, small internal hemorrhoids.  Recommended repeat colonoscopy in 10 years.   COLONOSCOPY WITH PROPOFOL N/A 11/25/2019   Procedure: COLONOSCOPY WITH PROPOFOL;  Surgeon: Eloise Harman, DO;  Location: [facility] ENDO SUITE;  Service: Endoscopy;  Laterality: N/A;  10:45am   OTHER SURGICAL HISTORY     heart surgery as infant to "repair hole in heart"   PORTACATH PLACEMENT Left 02/21/2019   Procedure: INSERTION PORT-A-CATH;  Surgeon: Virl Cagey, MD;  Location: [facility] ORS;  Service: General;  Laterality: Left;    SOCIAL HISTORY:  Social History   Socioeconomic History   Marital status: Legally Separated    Spouse name: Not on file    Number of children: Not on file   Years of education: Not on file   Highest education level: Not on file  Occupational History   Not on file  Tobacco Use   Smoking status: Never   Smokeless tobacco: Never  Vaping Use   Vaping Use: Never used  Substance and Sexual Activity   Alcohol use: No   Drug use: No   Sexual activity: Not on file    Comment: divorced- 2 daughters  Other Topics Concern   Not on file  Social History Narrative   Not on file   Social Determinants of Health   Financial Resource Strain: Low Risk  (05/31/2020)   Overall Financial Resource Strain (CARDIA)    Difficulty of Paying Living Expenses: Not hard at all  Food Insecurity: No Food Insecurity (05/31/2020)   Hunger Vital Sign    Worried About Running Out of Food in the Last Year: Never true    Ran Out of Food in the Last Year: Never true  Transportation Needs: No Transportation Needs (05/31/2020)   PRAPARE - Transportation    Lack  of Transportation (Medical): No    Lack of Transportation (Non-Medical): No  Physical Activity: Inactive (05/31/2020)   Exercise Vital Sign    Days of Exercise per Week: 0 days    Minutes of Exercise per Session: 0 min  Stress: No Stress Concern Present (05/31/2020)   [place]    Feeling of Stress : Not at all  Social Connections: Unknown (05/31/2020)   Social Connection and Isolation Panel [NHANES]    Frequency of Communication with Friends and Family: More than three times a week    Frequency of Social Gatherings with Friends and Family: Once a week    Attends Religious Services: More than 4 times per year    Active Member of [employer] or Organizations: No    Attends Archivist Meetings: Never    Marital Status: Not on file  Intimate Partner Violence: Not At Risk (05/31/2020)   Humiliation, Afraid, Rape, and Kick questionnaire    Fear of Current or Ex-Partner: No    Emotionally Abused: No    Physically  Abused: No    Sexually Abused: No    FAMILY HISTORY:  Family History  Problem Relation Age of Onset   Cancer Mother    Hypertension Mother    Cancer Father    Hypertension Father    Cancer Maternal Grandmother    Hypertension Sister    Hypertension Brother    Prostate cancer Brother    Colon cancer Neg Hx    Colon polyps Neg Hx     CURRENT MEDICATIONS:  Current Outpatient Medications  Medication Sig Dispense Refill   acetaminophen (TYLENOL) 650 MG CR tablet Take 650 mg by mouth every 8 (eight) hours as needed for pain.     acyclovir (ZOVIRAX) 400 MG tablet Take 1 tablet (400 mg total) by mouth 2 (two) times daily. 60 tablet 6   albuterol (VENTOLIN HFA) 108 (90 Base) MCG/ACT inhaler Inhale 2 puffs into the lungs every 6 (six) hours as needed for wheezing or shortness of breath. 8 g 3   albuterol (VENTOLIN HFA) 108 (90 Base) MCG/ACT inhaler 2 puffs as needed     ALPRAZolam (XANAX) 1 MG tablet Take 1 mg by mouth daily as needed.     Ascorbic Acid (VITAMIN C) 1000 MG tablet Take 1,000 mg by mouth daily.      aspirin EC 81 MG tablet Take 81 mg by mouth daily.     calcitRIOL (ROCALTROL) 0.25 MCG capsule Take by mouth.     chlorhexidine (PERIDEX) 0.12 % solution 15 mLs 2 (two) times daily.     cholecalciferol (VITAMIN D) 1000 units tablet Take 1,000 Units by mouth daily.     dapagliflozin propanediol (FARXIGA) 10 MG TABS tablet Take 1 tablet (10 mg total) by mouth daily before breakfast. 30 tablet 6   dexamethasone (DECADRON) 4 MG tablet Take 5 tablets once a week 30 tablet 6   Elastic Bandages & Supports (MEDICAL COMPRESSION STOCKINGS) MISC See admin instructions.     furosemide (LASIX) 20 MG tablet TAKE 2 TABLETS BY MOUTH DAILY IN THE MORNING AND TAKE 2 TABLETS AT BEDTIME 120 tablet 8   gabapentin (NEURONTIN) 300 MG capsule Take 1 capsule (300 mg total) by mouth 3 (three) times daily. 90 capsule 5   Infant Care Products (DERMACLOUD) OINT Apply to affected area as needed Externally for  30 days     losartan (COZAAR) 25 MG tablet Take 12.5 mg by mouth daily.  magnesium oxide (MAG-OX) 400 (240 Mg) MG tablet TAKE 1 TABLET BY MOUTH TWICE DAILY 60 tablet 6   metolazone (ZAROXOLYN) 2.5 MG tablet TAKE 1 TABLET BY MOUTH ONCE DAILY. 30 tablet 6   Multiple Vitamin (TAB-A-VITE) TABS TAKE 1 TABLET BY MOUTH ONCE DAILY. (Patient taking differently: Take 1 tablet by mouth daily.) 30 tablet 11   oxyCODONE-acetaminophen (PERCOCET/ROXICET) 5-325 MG tablet Take 1-2 tablets by mouth every 12 (twelve) hours as needed for severe pain. 60 tablet 0   potassium chloride SA (KLOR-CON M) 20 MEQ tablet TAKE (1) TABLET BY MOUTH THREE TIMES DAILY 90 tablet 6   rosuvastatin (CRESTOR) 10 MG tablet Take 1 tablet (10 mg total) by mouth daily. 90 tablet 3   Semaglutide, 2 MG/DOSE, (OZEMPIC, 2 MG/DOSE,) 8 MG/3ML SOPN Inject 2 mg into the skin once a week. 3 mL 11   spironolactone (ALDACTONE) 25 MG tablet Take 12.5 mg by mouth daily.     trolamine salicylate (ASPERCREME) 10 % cream Apply 1 application topically 2 (two) times daily as needed for muscle pain.     lidocaine-prilocaine (EMLA) cream APPLY A QUATER SIZED AMOUNT 1 HOUR PRIOR TO TREATMENT (Patient not taking: Reported on 09/21/2021) 30 g 10   pomalidomide (POMALYST) 4 MG capsule Take 1 capsule (4 mg total) by mouth daily. 21 days on, 7 days off 21 capsule 0   No current facility-administered medications for this visit.   Facility-Administered Medications Ordered in Other Visits  Medication Dose Route Frequency Provider Last Rate Last Admin   heparin lock flush 100 unit/mL  500 Units Intravenous Once [place], Kelby Fam, MD       sodium chloride 0.9 % injection 10 mL  10 mL Intravenous Once [place], Kelby Fam, MD        ALLERGIES:  Allergies  Allergen Reactions   Diclofenac Swelling    Per pt, facial swelling    PHYSICAL EXAM:  Performance status (ECOG): 1 - Symptomatic but completely ambulatory  Vitals:   09/21/21 1200  BP: 118/68  Pulse:  74  Resp: 18  Temp: 98.1 F (36.7 C)  SpO2: 95%   Wt Readings from Last 3 Encounters:  09/21/21 (!) 328 lb (148.8 kg)  06/29/21 (!) 340 lb 3.2 oz (154.3 kg)  06/22/21 (!) 336 lb 6.8 oz (152.6 kg)   Physical Exam Vitals reviewed.  Constitutional:      Appearance: Normal appearance. She is obese.  Cardiovascular:     Rate and Rhythm: Normal rate and regular rhythm.     Pulses: Normal pulses.     Heart sounds: Normal heart sounds.  Pulmonary:     Effort: Pulmonary effort is normal.     Breath sounds: Normal breath sounds.  Musculoskeletal:     Right lower leg: 1+ Edema present.     Left lower leg: 1+ Edema present.  Neurological:     General: No focal deficit present.     Mental Status: She is alert and oriented to person, place, and time.  Psychiatric:        Mood and Affect: Mood normal.        Behavior: Behavior normal.      LABORATORY DATA:  I have reviewed the labs as listed.     Latest Ref Rng & Units 09/14/2021   11:00 AM 06/15/2021   10:08 AM 03/09/2021   11:07 AM  CBC  WBC 4.0 - 10.5 K/uL 9.5  6.7  6.0   Hemoglobin 12.0 - 15.0 g/dL 10.6  10.6  10.8   Hematocrit 36.0 - 46.0 % 33.2  33.0  32.8   Platelets 150 - 400 K/uL 285  298  260       Latest Ref Rng & Units 09/14/2021   11:00 AM 04/21/2021    9:30 AM 03/09/2021   11:07 AM  CMP  Glucose 70 - 99 mg/dL 137  98  126   BUN 8 - 23 mg/dL 46  43  45   Creatinine 0.44 - 1.00 mg/dL 1.66  1.88  1.85   Sodium 135 - 145 mmol/L 136  135  133   Potassium 3.5 - 5.1 mmol/L 3.4  3.8  3.4   Chloride 98 - 111 mmol/L 95  94  97   CO2 22 - 32 mmol/L 30  28  29    Calcium 8.9 - 10.3 mg/dL 9.8  9.7  9.4   Total Protein 6.5 - 8.1 g/dL 7.2   7.3   Total Bilirubin 0.3 - 1.2 mg/dL 0.5   0.4   Alkaline Phos 38 - 126 U/L 48   53   AST 15 - 41 U/L 14   13   ALT 0 - 44 U/L 14   15     DIAGNOSTIC IMAGING:  I have independently reviewed the scans and discussed with the patient. No results found.   ASSESSMENT:  1.  Relapsed IgG  kappa multiple myeloma with high risk features: -5 cycles of carfilzomib, pomalidomide and dexamethasone from 03/05/2019 through 06/25/2019. -Myeloma panel on 07/09/2019 shows M spike 0.3 g, slightly up from 0.2 g previously.  However free light chain ratio has 1.37 and improved.  Kappa light chains are 24.8. - Carfilzomib discontinued after 08/06/2019 secondary to fluid retention. - She is currently on Pomalyst 4 mg 3 weeks on 1 week off. - PET scan on 03/22/2020 with no evidence of malignancy. - Bone marrow biopsy on 03/19/2020 showed approximately 5% plasma cells.  Chromosome analysis and FISH panel were normal.   2.  Shortness of breath on exertion: -2D echo on 02/09/2019 shows EF 60-65%.  No LVH. -Chest CT PE protocol on 02/24/2019 was negative. -Cardiac MRI on 03/07/2019 shows LVEF 56% with no amyloid.  Troponin T and proBNP were negative. -PFTs show low expiratory reserve volume consistent with body habitus.  Moderate diffusion defect which corrects to normal.  FVC, FEV1, FEV1/FVC ratio are within normal limits.  FVC is reduced relative to SVC indicates air-trapping.  No significant response after bronchodilators.  Reduced diffusion capacity indicates moderate loss of functional alveolar capillary space. -She was evaluated by Dr. Melvyn Novas. -2D echocardiogram on 08/27/2019 shows LVEF 60 to 65%.  No LVH.   3.  Myeloma bone disease: -Bone density on 07/04/2019 shows T score 1.8.   PLAN:  1.  Relapsed IgG kappa multiple myeloma with high risk features: - I have reviewed myeloma labs from 09/14/2021.  M spike is stable at 0.3 g.  Creatinine is 1.66 and calcium is normal.  Hemoglobin is 10.6 with normal white count and platelet count.  Free light chain ratio is 1.46 with kappa light chains of 24.9. - Recommend continuing pomalidomide 4 mg 3 weeks on/1 week off along with dexamethasone 20 mg weekly. - RTC 3 months with repeat myeloma labs 1 week prior.   2.  Shortness of breath on exertion: - This is stable.   Continue follow-up with pulmonary.   3.  ID prophylaxis: - Continue acyclovir twice daily.  Continue aspirin 81 mg for thromboprophylaxis.   4.  Neuropathy: - Predominantly in the hands and feet.  Continue gabapentin 300 mg 3 times daily.   5.  Lower extremity edema: - Continue Lasix 40 mg twice daily and metolazone 2.5 mg on Monday, Wednesday and Friday.  Creatinine today is 1.66.  Continue follow-up with Dr. Theador Hawthorne.   6.  Myeloma bone disease: - Zometa held after last infusion on 01/27/2020 due to worsening renal function.  She already received 2 years worth of zoledronic acid.   7.  Hypokalemia: - Continue potassium 2 tablets daily at home.  Potassium today is 3.4.   8.  Bilateral knee pains: - Renew Percocet 5/325 twice daily as needed.   Orders placed this encounter:  Orders Placed This Encounter  Procedures   CBC with Differential   Comprehensive metabolic panel   Lactate dehydrogenase   Kappa/lambda light chains   Immunofixation electrophoresis   Protein electrophoresis, serum   Magnesium      Derek Jack, MD [place] [phone]

## 2021-09-21 NOTE — Progress Notes (Signed)
Patient is taking Pomalyst as prescribed.  She has not missed any doses and reports no side effects at this time.   

## 2021-09-22 ENCOUNTER — Encounter (HOSPITAL_COMMUNITY): Payer: Self-pay | Admitting: Hematology

## 2021-10-12 ENCOUNTER — Other Ambulatory Visit: Payer: Self-pay

## 2021-10-12 DIAGNOSIS — G8929 Other chronic pain: Secondary | ICD-10-CM

## 2021-10-12 MED ORDER — OXYCODONE-ACETAMINOPHEN 5-325 MG PO TABS: 1.0000 | ORAL_TABLET | Freq: Two times a day (BID) | ORAL | 0 refills | Status: DC | PRN

## 2021-10-14 ENCOUNTER — Other Ambulatory Visit: Payer: Self-pay | Admitting: *Deleted

## 2021-10-19 ENCOUNTER — Other Ambulatory Visit: Payer: Self-pay

## 2021-10-19 MED ORDER — POMALIDOMIDE 4 MG PO CAPS: 4.0000 mg | ORAL_CAPSULE | Freq: Every day | ORAL | 0 refills | Status: DC

## 2021-10-19 NOTE — Telephone Encounter (Signed)
Chart reviewed. Pomalyst refilled per last office note with Dr. Katragadda.  

## 2021-10-21 ENCOUNTER — Encounter: Payer: Self-pay | Admitting: Internal Medicine

## 2021-10-21 ENCOUNTER — Ambulatory Visit: Payer: Medicare Other | Attending: Internal Medicine | Admitting: Internal Medicine

## 2021-10-21 DIAGNOSIS — I5030 Unspecified diastolic (congestive) heart failure: Secondary | ICD-10-CM | POA: Diagnosis not present

## 2021-10-21 DIAGNOSIS — C9000 Multiple myeloma not having achieved remission: Secondary | ICD-10-CM

## 2021-10-21 DIAGNOSIS — G4733 Obstructive sleep apnea (adult) (pediatric): Secondary | ICD-10-CM

## 2021-10-21 DIAGNOSIS — N183 Chronic kidney disease, stage 3 unspecified: Secondary | ICD-10-CM | POA: Diagnosis not present

## 2021-10-21 NOTE — Progress Notes (Signed)
Cardiology Office Note:    Date:  10/21/2021   ID:  MERSEDES Hurley, DOB 11-06-1955, MRN [id number]  PCP:  Alfonse Flavors, MD   [facility] Providers Cardiologist:  Werner Lean, MD     Referring MD: Alfonse Flavors,*   CC: HFpEF   History of Present Illness:    Kelli Hurley is a 66 y.o. female with a hx of HTN, morbid obesity, OSA on nocturnal O2.  MM with prior bortezemib, caflixomib and lenalidomide; CKD Stage IIIb who presents for evaluation 12/09/20. 2023: started ozempic.  Had sleep study. Leg swelling has improved  Patient notes that she is doing well.   Lost 15 lbs and hoping to lose more. There are no interval hospital/ED visit.    No chest pain or pressure .  No SOB/DOE and no PND/Orthopnea.  No weight gain or leg swelling.  No palpitations or syncope.  Goal is lose more weight for knee surgery to get her knees right. Has upcoming eval for sleep titration.   Past Medical History:  Diagnosis Date   Anemia    Arthritis    Cancer ([place])    Claustrophobia 10/05/2014   Hypertension    Leukopenia 08/03/2014   Normocytic hypochromic anemia 08/03/2014   Renal disorder    stage 3     Past Surgical History:  Procedure Laterality Date   ABDOMINAL HYSTERECTOMY     CARDIAC SURGERY     61 months old. States she had a leaky valve.    COLONOSCOPY  03/26/2009   Dr. Oneida Alar; normal colon, small internal hemorrhoids.  Recommended repeat colonoscopy in 10 years.   COLONOSCOPY WITH PROPOFOL N/A 11/25/2019   Procedure: COLONOSCOPY WITH PROPOFOL;  Surgeon: Eloise Harman, DO;  [facility];  Service: Endoscopy;  Laterality: N/A;  10:45am   OTHER SURGICAL HISTORY     heart surgery as infant to "repair hole in heart"   PORTACATH PLACEMENT Left 02/21/2019   Procedure: INSERTION PORT-A-CATH;  Surgeon: Virl Cagey, MD;  Location: [facility] ORS;  Service: General;  Laterality: Left;    Current Medications: Current Meds  Medication  Sig   acetaminophen (TYLENOL) 650 MG CR tablet Take 650 mg by mouth every 8 (eight) hours as needed for pain.   acyclovir (ZOVIRAX) 400 MG tablet TAKE ONE (1) TABLET BY MOUTH TWICE DAILY   albuterol (VENTOLIN HFA) 108 (90 Base) MCG/ACT inhaler Inhale 2 puffs into the lungs every 6 (six) hours as needed for wheezing or shortness of breath.   albuterol (VENTOLIN HFA) 108 (90 Base) MCG/ACT inhaler 2 puffs as needed   ALPRAZolam (XANAX) 1 MG tablet Take 1 mg by mouth daily as needed.   Ascorbic Acid (VITAMIN C) 1000 MG tablet Take 1,000 mg by mouth daily.    aspirin EC 81 MG tablet Take 81 mg by mouth daily.   calcitRIOL (ROCALTROL) 0.25 MCG capsule Take by mouth.   chlorhexidine (PERIDEX) 0.12 % solution 15 mLs 2 (two) times daily.   cholecalciferol (VITAMIN D) 1000 units tablet Take 1,000 Units by mouth daily.   dapagliflozin propanediol (FARXIGA) 10 MG TABS tablet Take 1 tablet (10 mg total) by mouth daily before breakfast.   dexamethasone (DECADRON) 4 MG tablet Take 5 tablets once a week   Elastic Bandages & Supports (MEDICAL COMPRESSION STOCKINGS) MISC See admin instructions.   furosemide (LASIX) 20 MG tablet TAKE 2 TABLETS BY MOUTH DAILY IN THE MORNING AND TAKE 2 TABLETS AT BEDTIME   gabapentin (NEURONTIN)  300 MG capsule TAKE 1 CAPSULE BY MOUTH THREE TIMES DAILY   Infant Care Products ([place]MAC[place]) OINT Apply to affected area as needed Externally for 30 days   lidocaine-prilocaine (EMLA) cream APPLY A QUATER SIZED AMOUNT 1 HOUR PRIOR TO TREATMENT   losartan (COZAAR) 25 MG tablet Take 12.5 mg by mouth daily.   magnesium oxide (MAG-OX) 400 (240 Mg) MG tablet TAKE 1 TABLET BY MOUTH TWICE DAILY   metolazone (ZAROXOLYN) 2.5 MG tablet TAKE 1 TABLET BY MOUTH ONCE DAILY.   Multiple Vitamin (TAB-A-VITE) TABS TAKE 1 TABLET BY MOUTH ONCE DAILY. (Patient taking differently: Take 1 tablet by mouth daily.)   oxyCODONE-acetaminophen (PERCOCET/ROXICET) 5-325 MG tablet Take 1-2 tablets by mouth every 12  (twelve) hours as needed for severe pain.   pomalidomide (POMALYST) 4 MG capsule Take 1 capsule (4 mg total) by mouth daily. 21 days on, 7 days off   potassium chloride SA (KLOR-CON M) 20 MEQ tablet TAKE 1 TABLET BY MOUTH THREE TIMES DAILY   rosuvastatin (CRESTOR) 10 MG tablet Take 1 tablet (10 mg total) by mouth daily.   Semaglutide, 2 MG/DOSE, (OZEMPIC, 2 MG/DOSE,) 8 MG/3ML SOPN Inject 2 mg into the skin once a week.   spironolactone (ALDACTONE) 25 MG tablet Take 12.5 mg by mouth daily.   trolamine salicylate (ASPERCREME) 10 % cream Apply 1 application topically 2 (two) times daily as needed for muscle pain.     Allergies:   Diclofenac   Social History   Socioeconomic History   Marital status: Legally Separated    Spouse name: Not on file   Number of children: Not on file   Years of education: Not on file   Highest education level: Not on file  Occupational History   Not on file  Tobacco Use   Smoking status: Never   Smokeless tobacco: Never  Vaping Use   Vaping Use: Never used  Substance and Sexual Activity   Alcohol use: No   Drug use: No   Sexual activity: Not on file    Comment: divorced- 2 daughters  Other Topics Concern   Not on file  Social History Narrative   Not on file   Social Determinants of Health   Financial Resource Strain: Low Risk  (05/31/2020)   Overall Financial Resource Strain (CARDIA)    Difficulty of Paying Living Expenses: Not hard at all  Food Insecurity: No Food Insecurity (05/31/2020)   Hunger Vital Sign    Worried About Running Out of Food in the Last Year: Never true    Vin[place] in the Last Year: Never true  Transportation Needs: No Transportation Needs (05/31/2020)   PRAPARE - Hydrologist (Medical): No    Lack of Transportation (Non-Medical): No  Physical Activity: Inactive (05/31/2020)   Exercise Vital Sign    Days of Exercise per Week: 0 days    Minutes of Exercise per Session: 0 min  Stress: No  Stress Concern Present (05/31/2020)   [place]    Feeling of Stress : Not at all  Social Connections: Unknown (05/31/2020)   Social Connection and Isolation Panel [NHANES]    Frequency of Communication with Friends and Family: More than three times a week    Frequency of Social Gatherings with Friends and Family: Once a week    Attends Religious Services: More than 4 times per year    Active Member of Clubs or Organizations: No  Attends Archivist Meetings: Never    Marital Status: Not on file     Family History: The patient's family history includes Cancer in her father, maternal grandmother, and mother; Hypertension in her brother, father, mother, and sister; Prostate cancer in her brother. There is no history of Colon cancer or Colon polyps.  ROS:   Please see the history of present illness.     All other systems reviewed and are negative.  EKGs/Labs/Other Studies Reviewed:    The following studies were reviewed today:  EKG:   12/08/20: Sinus rhythm rate 79 RAD  Transthoracic Echocardiogram: Date: 10/29/20 Results:  1. Left ventricular ejection fraction, by estimation, is 60 to 65%. The  left ventricle has normal function. The left ventricle has no regional  wall motion abnormalities. There is mild left ventricular hypertrophy.  Left ventricular diastolic parameters  are indeterminate.   2. Right ventricular systolic function is normal. The right ventricular  size is normal. Tricuspid regurgitation signal is inadequate for assessing  PA pressure.   3. The mitral valve is normal in structure. No evidence of mitral valve  regurgitation. No evidence of mitral stenosis.   4. The aortic valve has an indeterminant number of cusps. Aortic valve  regurgitation is not visualized. No aortic stenosis is present.   5. The inferior vena cava is normal in size with greater than 50%  respiratory variability,  suggesting right atrial pressure of 3 mmHg.   CTPE: Date:02/14/19 Results: Port No CAC  CMR: Date: 03/07/19 Results: IMPRESSION: 1. Normal cardiac MRI   2.  No evidence of cardiac amyloidosis   3.  Normal LV size and function EF 56%   4. No hyper-enhancement of LV myocardium on delayed inversion recovery sequences   5.  Normal RV size and function     Recent Labs: 04/21/2021: B Natriuretic Peptide 86.0 09/14/2021: ALT 14; BUN 46; Creatinine, Ser 1.66; Hemoglobin 10.6; Magnesium 2.3; Platelets 285; Potassium 3.4; Sodium 136  Recent Lipid Panel No results found for: "CHOL", "TRIG", "HDL", "CHOLHDL", "VLDL", "LDLCALC", "LDLDIRECT"       Physical Exam:    VS:  BP 108/72   Pulse 84   Ht $R'5\' 2"'SQ$  (1.575 m)   Wt (!) 325 lb (147.4 kg)   SpO2 95%   BMI 59.44 kg/m     Wt Readings from Last 3 Encounters:  10/21/21 (!) 325 lb (147.4 kg)  09/21/21 (!) 328 lb (148.8 kg)  06/29/21 (!) 340 lb 3.2 oz (154.3 kg)    GEN: Super morbidly obese well developed in no acute distress HEENT: Normal CARDIAC: RRR, no murmurs, rubs, gallops, distant heart sounds RESPIRATORY:  Clear to auscultation without rales, wheezing or rhonchi  ABDOMEN: Soft, non-tender, non-distended MUSCULOSKELETAL:  +2 edema bilaterally (improved from prior); No deformity  SKIN: Warm and dry NEUROLOGIC:  Alert and oriented x 3 PSYCHIATRIC:  Normal affect   ASSESSMENT:    1. Morbid obesity ([place])   2. OSA (obstructive sleep apnea)   3. Heart failure with preserved ejection fraction, unspecified HF chronicity (HCC)   4. Stage 3 chronic kidney disease, unspecified whether stage 3a or 3b CKD ([place])   5. Morbid (severe) obesity due to excess calories ([place])   6. Multiple myeloma, remission status unspecified (HCC)     PLAN:    Multiple Myeloma on steroids Super Morbid Obesity and OHS with on Ozempic CKD stage IIIa OSA on nocturnal O2, pending CPAP HFpEF Multi-factorial SOB from above - no cardiac indication for  asa,  she has MM will not change - NYHA class III, Stage C, hypervolemic but weights have improved  - continue SGLT2i - continue 12.5 mg aldactone, little BP room, could stop losartan if BP gets worse - doing well on Ozempic, sees Pharm D clinic for assist - clarified her new diuretic regimen, she now takes 40 mg lasix BID with metolazone M, W, and F; primarily managed by [facility] - continue rosuvastatin 10 mg  Six months with APP or new provider    Medication Adjustments/Labs and Tests Ordered: Current medicines are reviewed at length with the patient today.  Concerns regarding medicines are outlined above.  No orders of the defined types were placed in this encounter.  No orders of the defined types were placed in this encounter.    Patient Instructions  Medication Instructions:  Your physician recommends that you continue on your current medications as directed. Please refer to the Current Medication list given to you today.  *If you need a refill on your cardiac medications before your next appointment, please call your pharmacy*   Lab Work: None If you have labs (blood work) drawn today and your tests are completely normal, you will receive your results only by: [place] (if you have MyChart) OR A paper copy in the mail If you have any lab test that is abnormal or we need to change your treatment, we will call you to review the results.   Testing/Procedures: None   Follow-Up: At [facility], you and your health needs are our priority.  As part of our continuing mission to provide you with exceptional heart care, we have created designated Provider Care Teams.  These Care Teams include your primary Cardiologist (physician) and Advanced Practice Providers (APPs -  Physician Assistants and Nurse Practitioners) who all work together to provide you with the care you need, when you need it.  We recommend signing up for the patient portal called  "MyChart".  Sign up information is provided on this After Visit Summary.  MyChart is used to connect with patients for Virtual Visits (Telemedicine).  Patients are able to view lab/test results, encounter notes, upcoming appointments, etc.  Non-urgent messages can be sent to your provider as well.   To learn more about what you can do with MyChart, go to [email or web address].    Your next appointment:   6 month(s)  The format for your next appointment:   In Person  Provider:   Dr. Dellia Cloud     Important Information About Sugar         Signed, Werner Lean, MD  10/21/2021 1:27 PM    [place]

## 2021-10-21 NOTE — Patient Instructions (Addendum)
Medication Instructions:  Your physician recommends that you continue on your current medications as directed. Please refer to the Current Medication list given to you today.  *If you need a refill on your cardiac medications before your next appointment, please call your pharmacy*   Lab Work: None If you have labs (blood work) drawn today and your tests are completely normal, you will receive your results only by: [place] (if you have MyChart) OR A paper copy in the mail If you have any lab test that is abnormal or we need to change your treatment, we will call you to review the results.   Testing/Procedures: None   Follow-Up: At [facility], you and your health needs are our priority.  As part of our continuing mission to provide you with exceptional heart care, we have created designated Provider Care Teams.  These Care Teams include your primary Cardiologist (physician) and Advanced Practice Providers (APPs -  Physician Assistants and Nurse Practitioners) who all work together to provide you with the care you need, when you need it.  We recommend signing up for the patient portal called "MyChart".  Sign up information is provided on this After Visit Summary.  MyChart is used to connect with patients for Virtual Visits (Telemedicine).  Patients are able to view lab/test results, encounter notes, upcoming appointments, etc.  Non-urgent messages can be sent to your provider as well.   To learn more about what you can do with MyChart, go to [email or web address].    Your next appointment:   6 month(s)  The format for your next appointment:   In Person  Provider:   Dr. Dellia Cloud     Important Information About Sugar

## 2021-11-03 ENCOUNTER — Ambulatory Visit: Payer: Medicare Other | Admitting: Pulmonary Disease

## 2021-11-05 ENCOUNTER — Ambulatory Visit
Admission: EM | Admit: 2021-11-05 | Discharge: 2021-11-05 | Disposition: A | Discharge status: Home / Self Care | Payer: Medicare Other | Attending: Family Medicine | Admitting: Family Medicine

## 2021-11-05 DIAGNOSIS — M5431 Sciatica, right side: Secondary | ICD-10-CM

## 2021-11-05 MED ORDER — TIZANIDINE HCL 4 MG PO CAPS: 4.0000 mg | ORAL_CAPSULE | Freq: Three times a day (TID) | ORAL | 0 refills | Status: DC | PRN

## 2021-11-05 MED ORDER — METHYLPREDNISOLONE SODIUM SUCC 125 MG IJ SOLR
60.0000 mg | Freq: Once | INTRAMUSCULAR | Status: AC
  Administered 2021-11-05: 60 mg via INTRAMUSCULAR

## 2021-11-05 NOTE — ED Provider Notes (Signed)
[facility]    CSN: [id number] Arrival date & time: 11/05/21  1042      History   Chief Complaint Chief Complaint  Patient presents with   Back Pain    HPI Kelli Hurley is a 66 y.o. female.   Patient presenting today with 4-day history of right-sided low back and buttock pain radiating down to the back of the right leg.  She denies any known injury prior to onset of symptoms, states they felt pretty sudden and standing, sitting directly on this area causes the pain to be worse.  Some numbness and tingling down the foot but no bowel or bladder incontinence, saddle anesthesias, fever, chills, decreased range of motion.  Trying Tylenol and oxycodone with no relief.  Takes gabapentin for neuropathy regularly.    Past Medical History:  Diagnosis Date   Anemia    Arthritis    Cancer ([place])    Claustrophobia 10/05/2014   Hypertension    Leukopenia 08/03/2014   Normocytic hypochromic anemia 08/03/2014   Renal disorder    stage 3     Patient Active Problem List   Diagnosis Date Noted   OSA (obstructive sleep apnea) 04/07/2021   Heart failure with preserved ejection fraction ([place]) 12/09/2020   CKD (chronic kidney disease) stage 3, GFR 30-59 ml/min (HCC) 07/28/2020   Anemia in neoplastic disease 07/28/2020   Hypertension 05/12/2020   Nocturnal hypoxemia 09/02/2019   Colon cancer screening 07/31/2019   Morbid (severe) obesity due to excess calories ([place]) 06/28/2019   DOE (dyspnea on exertion) 06/27/2019   Goals of care, counseling/discussion 02/25/2019   H/O autologous stem cell transplant ([place]) 05/13/2015   Claustrophobia 10/05/2014   Multiple myeloma ([place]) 09/23/2014   Normocytic hypochromic anemia 08/03/2014   Leukopenia 08/03/2014   Primary osteoarthritis of both knees 11/18/2013    Past Surgical History:  Procedure Laterality Date   ABDOMINAL HYSTERECTOMY     CARDIAC SURGERY     21 months old. States she had a leaky valve.    COLONOSCOPY  03/26/2009    Dr. Oneida Alar; normal colon, small internal hemorrhoids.  Recommended repeat colonoscopy in 10 years.   COLONOSCOPY WITH PROPOFOL N/A 11/25/2019   Procedure: COLONOSCOPY WITH PROPOFOL;  Surgeon: Eloise Harman, DO;  [facility];  Service: Endoscopy;  Laterality: N/A;  10:45am   OTHER SURGICAL HISTORY     heart surgery as infant to "repair hole in heart"   PORTACATH PLACEMENT Left 02/21/2019   Procedure: INSERTION PORT-A-CATH;  Surgeon: Virl Cagey, MD;  Location: [facility];  Service: General;  Laterality: Left;    OB History   No obstetric history on file.      Home Medications    Prior to Admission medications   Medication Sig Start Date End Date Taking? Authorizing Provider  tiZANidine (ZANAFLEX) 4 MG capsule Take 1 capsule (4 mg total) by mouth 3 (three) times daily as needed for muscle spasms. Do not drink alcohol or drive while taking this medication.  May cause drowsiness. 11/05/21  Yes Volney American, PA-C  acetaminophen (TYLENOL) 650 MG CR tablet Take 650 mg by mouth every 8 (eight) hours as needed for pain.    [provider]  acyclovir (ZOVIRAX) 400 MG tablet TAKE ONE (1) TABLET BY MOUTH TWICE DAILY 09/22/21   Derek Jack, MD  albuterol (VENTOLIN HFA) 108 (90 Base) MCG/ACT inhaler Inhale 2 puffs into the lungs every 6 (six) hours as needed for wheezing or shortness of breath. 02/18/20  Derek Jack, MD  albuterol (VENTOLIN HFA) 108 (90 Base) MCG/ACT inhaler 2 puffs as needed    [provider]  ALPRAZolam Duanne Moron) 1 MG tablet Take 1 mg by mouth daily as needed. 03/17/20   [provider]  Ascorbic Acid (VITAMIN C) 1000 MG tablet Take 1,000 mg by mouth daily.     [provider]  aspirin EC 81 MG tablet Take 81 mg by mouth daily.    [provider]  calcitRIOL (ROCALTROL) 0.25 MCG capsule Take by mouth. 08/23/21   [provider]  chlorhexidine (PERIDEX) 0.12 % solution 15 mLs 2 (two) times  daily. 09/13/21   [provider]  cholecalciferol (VITAMIN D) 1000 units tablet Take 1,000 Units by mouth daily.    [provider]  dapagliflozin propanediol (FARXIGA) 10 MG TABS tablet Take 1 tablet (10 mg total) by mouth daily before breakfast. 04/21/21   Werner Lean, MD  dexamethasone (DECADRON) 4 MG tablet Take 5 tablets once a week 09/06/21   Derek Jack, MD  Elastic Bandages & Supports ([place]) [place] See admin instructions.    [provider]  furosemide (LASIX) 20 MG tablet TAKE 2 TABLETS BY MOUTH DAILY IN THE MORNING AND TAKE 2 TABLETS AT BEDTIME 08/24/21   Chandrasekhar, Mahesh A, MD  gabapentin (NEURONTIN) 300 MG capsule TAKE 1 CAPSULE BY MOUTH THREE TIMES DAILY 09/22/21   Derek Jack, MD  Infant Care Products [facility]) OINT Apply to affected area as needed Externally for 30 days 08/08/21   [provider]  lidocaine-prilocaine (EMLA) cream APPLY A QUATER SIZED AMOUNT 1 HOUR PRIOR TO TREATMENT 04/21/21   Derek Jack, MD  losartan (COZAAR) 25 MG tablet Take 12.5 mg by mouth daily. 06/16/20   [provider]  magnesium oxide (MAG-OX) 400 (240 Mg) MG tablet TAKE 1 TABLET BY MOUTH TWICE DAILY 07/12/21   Derek Jack, MD  metolazone (ZAROXOLYN) 2.5 MG tablet TAKE 1 TABLET BY MOUTH ONCE DAILY. 10/26/20   Derek Jack, MD  Multiple Vitamin (TAB-A-VITE) TABS TAKE 1 TABLET BY MOUTH ONCE DAILY. Patient taking differently: Take 1 tablet by mouth daily. 04/10/17   Holley Bouche, NP  oxyCODONE-acetaminophen (PERCOCET/ROXICET) 5-325 MG tablet Take 1-2 tablets by mouth every 12 (twelve) hours as needed for severe pain. 10/12/21   Derek Jack, MD  pomalidomide (POMALYST) 4 MG capsule Take 1 capsule (4 mg total) by mouth daily. 21 days on, 7 days off 10/19/21   Derek Jack, MD  potassium chloride SA (KLOR-CON M) 20 MEQ tablet TAKE 1 TABLET BY MOUTH THREE TIMES DAILY 09/22/21    Derek Jack, MD  rosuvastatin (CRESTOR) 10 MG tablet Take 1 tablet (10 mg total) by mouth daily. 05/16/21   Chandrasekhar, Mahesh A, MD  Semaglutide, 2 MG/DOSE, (OZEMPIC, 2 MG/DOSE,) 8 MG/3ML SOPN Inject 2 mg into the skin once a week. 08/16/21   Freada Bergeron, MD  spironolactone (ALDACTONE) 25 MG tablet Take 12.5 mg by mouth daily. 06/14/21   [provider]  trolamine salicylate (ASPERCREME) 10 % cream Apply 1 application topically 2 (two) times daily as needed for muscle pain.    [provider]    Family History Family History  Problem Relation Age of Onset   Cancer Mother    Hypertension Mother    Cancer Father    Hypertension Father    Cancer Maternal Grandmother    Hypertension Sister    Hypertension Brother    Prostate cancer Brother    Colon  cancer Neg Hx    Colon polyps Neg Hx     Social History Social History   Tobacco Use   Smoking status: Never   Smokeless tobacco: Never  Vaping Use   Vaping Use: Never used  Substance Use Topics   Alcohol use: No   Drug use: No     Allergies   Diclofenac   Review of Systems Review of Systems Per HPI  Physical Exam Triage Vital Signs ED Triage Vitals  Enc Vitals Group     BP 11/05/21 1151 112/73     Pulse Rate 11/05/21 1151 76     Resp 11/05/21 1151 20     Temp 11/05/21 1151 (!) 97.5 F (36.4 C)     Temp Source 11/05/21 1151 Oral     SpO2 11/05/21 1151 92 %     Weight --      Height --      Head Circumference --      Peak Flow --      Pain Score 11/05/21 1152 10     Pain Loc --      Pain Edu? --      Excl. in [place]? --    No data found.  Updated Vital Signs BP 112/73 (BP Location: Left Arm)   Pulse 76   Temp (!) 97.5 F (36.4 C) (Oral)   Resp 20   SpO2 92%   Visual Acuity Right Eye Distance:   Left Eye Distance:   Bilateral Distance:    Right Eye Near:   Left Eye Near:    Bilateral Near:     Physical Exam Vitals and nursing note reviewed.  Constitutional:       Appearance: Normal appearance. She is not ill-appearing.  HENT:     Head: Atraumatic.     Mouth/Throat:     Mouth: Mucous membranes are moist.  Eyes:     Extraocular Movements: Extraocular movements intact.     Conjunctiva/sclera: Conjunctivae normal.  Cardiovascular:     Rate and Rhythm: Normal rate and regular rhythm.     Heart sounds: Normal heart sounds.  Pulmonary:     Effort: Pulmonary effort is normal.     Breath sounds: Normal breath sounds.  Musculoskeletal:        General: Normal range of motion.     Cervical back: Normal range of motion and neck supple.     Comments: No midline spinal tenderness to palpation diffusely.  Right lateral lumbar musculature and gluteal muscles tender to palpation, negative straight leg raise bilaterally  Skin:    General: Skin is warm and dry.  Neurological:     Mental Status: She is alert and oriented to person, place, and time.     Motor: No weakness.     Gait: Gait normal.     Comments: Right lower extremity neurovascularly intact  Psychiatric:        Mood and Affect: Mood normal.        Thought Content: Thought content normal.        Judgment: Judgment normal.      UC Treatments / Results  Labs (all labs ordered are listed, but only abnormal results are displayed) Labs Reviewed - No data to display  EKG   Radiology No results found.  Procedures Procedures (including critical care time)  Medications Ordered in UC Medications  methylPREDNISolone sodium succinate (SOLU-MEDROL) 125 mg/2 mL injection 60 mg (60 mg Intramuscular Given 11/05/21 1232)    Initial Impression / Assessment  and Plan / UC Course  I have reviewed the triage vital signs and the nursing notes.  Pertinent labs & imaging results that were available during my care of the patient were reviewed by me and considered in my medical decision making (see chart for details).     Treat with IM Solu-Medrol, Zanaflex, over-the-counter pain relievers and typical  pain regimen.  Discussed stretches, heat, massage and close follow-up with PCP.  Final Clinical Impressions(s) / UC Diagnoses   Final diagnoses:  Right sided sciatica   Discharge Instructions   None    ED Prescriptions     Medication Sig Dispense Auth. Provider   tiZANidine (ZANAFLEX) 4 MG capsule Take 1 capsule (4 mg total) by mouth 3 (three) times daily as needed for muscle spasms. Do not drink alcohol or drive while taking this medication.  May cause drowsiness. 15 capsule Volney American, [place]      PDMP not reviewed this encounter.   Volney American, [place] 11/05/21 1513

## 2021-11-05 NOTE — ED Triage Notes (Signed)
Pt reports lower back pain radiates to right knee x 4 days. Arthritis Tylenol and Oxycodone gives no relief. Pain is worse when sit down.

## 2021-11-07 ENCOUNTER — Other Ambulatory Visit: Payer: Self-pay

## 2021-11-07 DIAGNOSIS — G8929 Other chronic pain: Secondary | ICD-10-CM

## 2021-11-07 MED ORDER — OXYCODONE-ACETAMINOPHEN 5-325 MG PO TABS: 1.0000 | ORAL_TABLET | Freq: Two times a day (BID) | ORAL | 0 refills | Status: DC | PRN

## 2021-11-08 ENCOUNTER — Other Ambulatory Visit: Payer: Self-pay | Admitting: *Deleted

## 2021-11-08 DIAGNOSIS — G8929 Other chronic pain: Secondary | ICD-10-CM

## 2021-11-08 MED ORDER — OXYCODONE-ACETAMINOPHEN 5-325 MG PO TABS: 1.0000 | ORAL_TABLET | Freq: Two times a day (BID) | ORAL | 0 refills | Status: DC | PRN

## 2021-11-09 ENCOUNTER — Other Ambulatory Visit: Payer: Self-pay

## 2021-11-09 DIAGNOSIS — G8929 Other chronic pain: Secondary | ICD-10-CM

## 2021-11-09 MED ORDER — OXYCODONE-ACETAMINOPHEN 5-325 MG PO TABS: 1.0000 | ORAL_TABLET | Freq: Two times a day (BID) | ORAL | 0 refills | Status: DC | PRN

## 2021-11-16 ENCOUNTER — Other Ambulatory Visit: Payer: Self-pay

## 2021-11-16 MED ORDER — METOLAZONE 2.5 MG PO TABS: 2.5000 mg | ORAL_TABLET | Freq: Every day | ORAL | 6 refills | Status: DC

## 2021-11-17 ENCOUNTER — Ambulatory Visit: Payer: Medicare Other | Admitting: Pulmonary Disease

## 2021-11-18 ENCOUNTER — Other Ambulatory Visit: Payer: Self-pay

## 2021-11-18 MED ORDER — POMALIDOMIDE 4 MG PO CAPS: 4.0000 mg | ORAL_CAPSULE | Freq: Every day | ORAL | 0 refills | Status: DC

## 2021-11-18 NOTE — Telephone Encounter (Signed)
Chart reviewed. Pomalyst refilled per last office note with Dr. Katragadda.  

## 2021-11-24 ENCOUNTER — Other Ambulatory Visit: Payer: Self-pay | Admitting: Internal Medicine

## 2021-11-24 ENCOUNTER — Encounter: Payer: Self-pay | Admitting: Pulmonary Disease

## 2021-11-24 ENCOUNTER — Ambulatory Visit (INDEPENDENT_AMBULATORY_CARE_PROVIDER_SITE_OTHER): Payer: Medicare Other | Admitting: Pulmonary Disease

## 2021-11-24 ENCOUNTER — Telehealth: Payer: Self-pay | Admitting: Internal Medicine

## 2021-11-24 VITALS — BP 138/84 | HR 74 | Temp 98.0°F | Ht 62.0 in | Wt 325.0 lb

## 2021-11-24 DIAGNOSIS — Z23 Encounter for immunization: Secondary | ICD-10-CM

## 2021-11-24 DIAGNOSIS — G4734 Idiopathic sleep related nonobstructive alveolar hypoventilation: Secondary | ICD-10-CM

## 2021-11-24 DIAGNOSIS — G4733 Obstructive sleep apnea (adult) (pediatric): Secondary | ICD-10-CM

## 2021-11-24 MED ORDER — SEMAGLUTIDE (1 MG/DOSE) 4 MG/3ML ~~LOC~~ SOPN: 1.0000 mg | PEN_INJECTOR | SUBCUTANEOUS | 11 refills | Status: DC

## 2021-11-24 NOTE — Telephone Encounter (Signed)
There are some supply issues with ozempic '2mg'$ . Will have to decrease to '1mg'$  until the '2mg'$  is available. LVM for pt to call back

## 2021-11-24 NOTE — Assessment & Plan Note (Signed)
Unable to tolerate CPAP and hence placed on BiPAP. Download was reviewed which shows excellent control of events with good usage 4.5 hours per night, large leak. We discussed tightening the mask to decrease leak. She is very compliant and biPAP is certainly helped improve her daytime somnolence and fatigue.  Weight loss encouraged, compliance with goal of at least 4-6 hrs every night is the expectation. Advised against medications with sedative side effects Cautioned against driving when sleepy - understanding that sleepiness will vary on a day to day basis

## 2021-11-24 NOTE — Patient Instructions (Signed)
  BiPAP is working well  Repeat oxygen test in 3 months

## 2021-11-24 NOTE — Progress Notes (Signed)
   Subjective:    Patient ID: Kelli Hurley, female    DOB: [birth year], 66 y.o.   MRN: [id number]  HPI  66 year old morbidly obese never smoker for follow-up of OSA and nocturnal hypoxia  PMH -multiple myeloma status post bone marrow transplantation 2017, on Pomalyst -HFpEF, cardiac MRI neg -CKD stage II  Chief Complaint  Patient presents with   Follow-up    Cpap working well  Flu shot today     Arrives in a wheelchair due to severe osteoarthritis.  We reviewed her sleep study that showed severe OSA with severe desaturations.  She could not tolerate CPAP and was placed on BiPAP with oxygen blended) She has been using this for about 2 months.  Feels better rested, has more energy.  She is able to tolerate the noise made by the oxygen concentrator She settled down with a fullface mask, denies any problems with pressure  She needs to lose at least 30 to 40 pounds before being considered for knee surgery   Significant tests/ events reviewed Split study 07/2021 >> AHI 21/h, low sat 65%, could not tolerate higher pressure on CPAP due to dificulty exhaling , Corrected by BiPAP 13/9 + 2 L O2 blended  07/26/2020   Walked RA  approx   200 ft  @ slow/ waddling pace  stopped due to  Sob and sats 87% "much more than usual activity" - Echo 02/19/19 nl x for mild LAE  - CTa 02/24/19 no large PE, no ILD  - Neg MR cardiac 03/07/19 neg for amyloid, nl ef  - PFT's  05/20/19   FEV1 1.92 (103 % ) ratio 0.91  p 6 % improvement from saba p nothing prior to study with DLCO  11.73 (62%) corrects to 3.66 (85 %)  for alv volume and FV curve nl and ERV 36%     08/2019 ONO slept for 8 hours and 43 minutes, testing showing time spent below 88% 4 hours and 6 minutes, lowest oxygen level appears to be 68% rec 2lpm hs   Review of Systems   neg for any significant sore throat, dysphagia, itching, sneezing, nasal congestion or excess/ purulent secretions, fever, chills, sweats, unintended wt loss, pleuritic or  exertional cp, hempoptysis, orthopnea pnd or change in chronic leg swelling. Also denies presyncope, palpitations, heartburn, abdominal pain, nausea, vomiting, diarrhea or change in bowel or urinary habits, dysuria,hematuria, rash, arthralgias, visual complaints, headache, numbness weakness or ataxia.     Objective:   Physical Exam  Gen. Pleasant, obese, in no distress, in wheelchair ENT - no lesions, no post nasal drip Neck: No JVD, no thyromegaly, no carotid bruits Lungs: no use of accessory muscles, no dullness to percussion, decreased without rales or rhonchi  Cardiovascular: Rhythm regular, heart sounds  normal, no murmurs or gallops, no peripheral edema Musculoskeletal: No deformities, no cyanosis or clubbing , no tremors         Assessment & Plan:

## 2021-11-24 NOTE — Telephone Encounter (Signed)
Pt c/o medication issue:  1. Name of Medication:   Semaglutide, 2 MG/DOSE, (OZEMPIC, 2 MG/DOSE,) 8 MG/3ML SOPN    2. How are you currently taking this medication (dosage and times per day)? Inject 2 mg into the skin once a week.  3. Are you having a reaction (difficulty breathing--STAT)? No  4. What is your medication issue? Pt states that pharmacy does not have medication. She says that it is on back order and they only have the 2.5 MG dosage. Pt states that she is almost out of medication. Please advise

## 2021-11-24 NOTE — Telephone Encounter (Signed)
Left detailed message on VM per DPR that due to shortage of '2mg'$ , I sent in rx for '1mg'$ .

## 2021-11-24 NOTE — Assessment & Plan Note (Addendum)
She has 2 L oxygen blended into her machine.  We will reassess nocturnal oximetry on BiPAP/room air after she has used this for a few months prior to her next appointment  Flu vaccination today

## 2021-11-28 ENCOUNTER — Telehealth: Payer: Self-pay | Admitting: Pulmonary Disease

## 2021-11-28 ENCOUNTER — Other Ambulatory Visit: Payer: Self-pay | Admitting: *Deleted

## 2021-11-28 DIAGNOSIS — G8929 Other chronic pain: Secondary | ICD-10-CM

## 2021-11-28 NOTE — Telephone Encounter (Signed)
Patient made aware of message below.

## 2021-11-28 NOTE — Telephone Encounter (Signed)
Office notes printed and faxed for office visit. Nothing further needed

## 2021-11-29 ENCOUNTER — Other Ambulatory Visit: Payer: Self-pay | Admitting: *Deleted

## 2021-11-29 MED ORDER — OXYCODONE-ACETAMINOPHEN 5-325 MG PO TABS: 1.0000 | ORAL_TABLET | Freq: Two times a day (BID) | ORAL | 0 refills | Status: DC | PRN

## 2021-11-29 MED ORDER — METOLAZONE 2.5 MG PO TABS: 2.5000 mg | ORAL_TABLET | Freq: Every day | ORAL | 6 refills | Status: DC

## 2021-12-01 ENCOUNTER — Other Ambulatory Visit: Payer: Self-pay

## 2021-12-01 DIAGNOSIS — G8929 Other chronic pain: Secondary | ICD-10-CM

## 2021-12-01 MED ORDER — OXYCODONE-ACETAMINOPHEN 5-325 MG PO TABS: 1.0000 | ORAL_TABLET | Freq: Two times a day (BID) | ORAL | 0 refills | Status: DC | PRN

## 2021-12-13 ENCOUNTER — Inpatient Hospital Stay: Payer: Medicare Other | Attending: Hematology

## 2021-12-13 ENCOUNTER — Other Ambulatory Visit: Payer: Self-pay

## 2021-12-13 DIAGNOSIS — M25561 Pain in right knee: Secondary | ICD-10-CM | POA: Diagnosis not present

## 2021-12-13 DIAGNOSIS — Z9481 Bone marrow transplant status: Secondary | ICD-10-CM | POA: Diagnosis not present

## 2021-12-13 DIAGNOSIS — C9000 Multiple myeloma not having achieved remission: Secondary | ICD-10-CM | POA: Insufficient documentation

## 2021-12-13 DIAGNOSIS — E876 Hypokalemia: Secondary | ICD-10-CM | POA: Insufficient documentation

## 2021-12-13 DIAGNOSIS — Z8042 Family history of malignant neoplasm of prostate: Secondary | ICD-10-CM | POA: Insufficient documentation

## 2021-12-13 DIAGNOSIS — Z79899 Other long term (current) drug therapy: Secondary | ICD-10-CM | POA: Diagnosis not present

## 2021-12-13 DIAGNOSIS — R0602 Shortness of breath: Secondary | ICD-10-CM | POA: Diagnosis not present

## 2021-12-13 DIAGNOSIS — R600 Localized edema: Secondary | ICD-10-CM | POA: Diagnosis not present

## 2021-12-13 DIAGNOSIS — Z79624 Long term (current) use of inhibitors of nucleotide synthesis: Secondary | ICD-10-CM | POA: Diagnosis not present

## 2021-12-13 DIAGNOSIS — D649 Anemia, unspecified: Secondary | ICD-10-CM | POA: Insufficient documentation

## 2021-12-13 DIAGNOSIS — M25562 Pain in left knee: Secondary | ICD-10-CM | POA: Diagnosis not present

## 2021-12-13 DIAGNOSIS — Z9484 Stem cells transplant status: Secondary | ICD-10-CM | POA: Insufficient documentation

## 2021-12-13 DIAGNOSIS — Z8249 Family history of ischemic heart disease and other diseases of the circulatory system: Secondary | ICD-10-CM | POA: Diagnosis not present

## 2021-12-13 DIAGNOSIS — Z8719 Personal history of other diseases of the digestive system: Secondary | ICD-10-CM | POA: Insufficient documentation

## 2021-12-13 DIAGNOSIS — M2550 Pain in unspecified joint: Secondary | ICD-10-CM | POA: Insufficient documentation

## 2021-12-13 DIAGNOSIS — G629 Polyneuropathy, unspecified: Secondary | ICD-10-CM | POA: Insufficient documentation

## 2021-12-13 DIAGNOSIS — Z809 Family history of malignant neoplasm, unspecified: Secondary | ICD-10-CM | POA: Insufficient documentation

## 2021-12-13 DIAGNOSIS — R200 Anesthesia of skin: Secondary | ICD-10-CM | POA: Diagnosis not present

## 2021-12-13 DIAGNOSIS — I129 Hypertensive chronic kidney disease with stage 1 through stage 4 chronic kidney disease, or unspecified chronic kidney disease: Secondary | ICD-10-CM | POA: Diagnosis not present

## 2021-12-13 DIAGNOSIS — N183 Chronic kidney disease, stage 3 unspecified: Secondary | ICD-10-CM | POA: Insufficient documentation

## 2021-12-13 LAB — COMPREHENSIVE METABOLIC PANEL
ALT: 13 U/L (ref 0–44)
AST: 16 U/L (ref 15–41)
Albumin: 3.5 g/dL (ref 3.5–5.0)
Alkaline Phosphatase: 49 U/L (ref 38–126)
Anion gap: 9 (ref 5–15)
BUN: 41 mg/dL — ABNORMAL HIGH (ref 8–23)
CO2: 27 mmol/L (ref 22–32)
Calcium: 9.2 mg/dL (ref 8.9–10.3)
Chloride: 99 mmol/L (ref 98–111)
Creatinine, Ser: 1.84 mg/dL — ABNORMAL HIGH (ref 0.44–1.00)
GFR, Estimated: 30 mL/min — ABNORMAL LOW (ref >60)
Glucose, Bld: 129 mg/dL — ABNORMAL HIGH (ref 70–99)
Potassium: 3.9 mmol/L (ref 3.5–5.1)
Sodium: 135 mmol/L (ref 135–145)
Total Bilirubin: 0.4 mg/dL (ref 0.3–1.2)
Total Protein: 6.9 g/dL (ref 6.5–8.1)

## 2021-12-13 LAB — LACTATE DEHYDROGENASE: LDH: 134 U/L (ref 98–192)

## 2021-12-13 LAB — CBC WITH DIFFERENTIAL/PLATELET
Abs Immature Granulocytes: 0.07 10*3/uL (ref 0.00–0.07)
Basophils Absolute: 0 10*3/uL (ref 0.0–0.1)
Basophils Relative: 1 %
Eosinophils Absolute: 0 10*3/uL (ref 0.0–0.5)
Eosinophils Relative: 1 %
HCT: 26.8 % — ABNORMAL LOW (ref 36.0–46.0)
Hemoglobin: 8.7 g/dL — ABNORMAL LOW (ref 12.0–15.0)
Immature Granulocytes: 2 %
Lymphocytes Relative: 19 %
Lymphs Abs: 0.8 10*3/uL (ref 0.7–4.0)
MCH: 30.5 pg (ref 26.0–34.0)
MCHC: 32.5 g/dL (ref 30.0–36.0)
MCV: 94 fL (ref 80.0–100.0)
Monocytes Absolute: 0.3 10*3/uL (ref 0.1–1.0)
Monocytes Relative: 8 %
Neutro Abs: 2.8 10*3/uL (ref 1.7–7.7)
Neutrophils Relative %: 69 %
Platelets: 274 10*3/uL (ref 150–400)
RBC: 2.85 MIL/uL — ABNORMAL LOW (ref 3.87–5.11)
RDW: 16.6 % — ABNORMAL HIGH (ref 11.5–15.5)
WBC: 4 10*3/uL (ref 4.0–10.5)
nRBC: 0 % (ref 0.0–0.2)

## 2021-12-13 LAB — MAGNESIUM: Magnesium: 2.3 mg/dL (ref 1.7–2.4)

## 2021-12-13 MED ORDER — HEPARIN SOD (PORK) LOCK FLUSH 100 UNIT/ML IV SOLN
500.0000 [IU] | Freq: Once | INTRAVENOUS | Status: AC
  Administered 2021-12-13: 500 [IU] via INTRAVENOUS

## 2021-12-13 MED ORDER — POMALIDOMIDE 4 MG PO CAPS: 4.0000 mg | ORAL_CAPSULE | Freq: Every day | ORAL | 0 refills | Status: DC

## 2021-12-13 MED ORDER — SODIUM CHLORIDE 0.9% FLUSH
10.0000 mL | INTRAVENOUS | Status: DC | PRN
  Administered 2021-12-13: 10 mL via INTRAVENOUS

## 2021-12-13 NOTE — Telephone Encounter (Signed)
Chart reviewed. Pomalyst refilled per last office note with Dr. Katragadda.  

## 2021-12-13 NOTE — Progress Notes (Signed)
Corrin Parker presented for Portacath access and flush. Proper placement of portacath confirmed by CXR. Portacath located left chest wall accessed with  H 20 needle. Good blood return present. Portacath flushed with 35m NS and 500U/577mHeparin and needle removed intact. Procedure without incident. Patient tolerated procedure well.  Labs done per oders.   Patient tolerated it well without problems. Vitals stable and discharged home from clinic ambulatory. Follow up as scheduled.

## 2021-12-13 NOTE — Patient Instructions (Signed)
[facility]  Discharge Instructions: Thank you for choosing [facility] to provide your oncology and hematology care.  If you have a lab appointment with the [facility], please come in thru the Main Entrance and check in at the main information desk.  Wear comfortable clothing and clothing appropriate for easy access to any Portacath or PICC line.   We strive to give you quality time with your provider. You may need to reschedule your appointment if you arrive late (15 or more minutes).  Arriving late affects you and other patients whose appointments are after yours.  Also, if you miss three or more appointments without notifying the office, you may be dismissed from the clinic at the provider's discretion.      For prescription refill requests, have your pharmacy contact our office and allow 72 hours for refills to be completed.    Port flush labs with done today.    To help prevent nausea and vomiting after your treatment, we encourage you to take your nausea medication as directed.  BELOW ARE SYMPTOMS THAT SHOULD BE REPORTED IMMEDIATELY: *FEVER GREATER THAN 100.4 F (38 C) OR HIGHER *CHILLS OR SWEATING *NAUSEA AND VOMITING THAT IS NOT CONTROLLED WITH YOUR NAUSEA MEDICATION *UNUSUAL SHORTNESS OF BREATH *UNUSUAL BRUISING OR BLEEDING *URINARY PROBLEMS (pain or burning when urinating, or frequent urination) *BOWEL PROBLEMS (unusual diarrhea, constipation, pain near the anus) TENDERNESS IN MOUTH AND THROAT WITH OR WITHOUT PRESENCE OF ULCERS (sore throat, sores in mouth, or a toothache) UNUSUAL RASH, SWELLING OR PAIN  UNUSUAL VAGINAL DISCHARGE OR ITCHING   Items with * indicate a potential emergency and should be followed up as soon as possible or go to the Emergency Department if any problems should occur.  Please show the CHEMOTHERAPY ALERT CARD or IMMUNOTHERAPY ALERT CARD at check-in to the Emergency Department and triage nurse.  Should you have  questions after your visit or need to cancel or reschedule your appointment, please contact [facility] [phone]  and follow the prompts.  Office hours are 8:00 a.m. to 4:30 p.m. Monday - Friday. Please note that voicemails left after 4:00 p.m. may not be returned until the following business day.  We are closed weekends and major holidays. You have access to a nurse at all times for urgent questions. Please call the main number to the clinic [phone] and follow the prompts.  For any non-urgent questions, you may also contact your provider using MyChart. We now offer e-Visits for anyone 38 and older to request care online for non-urgent symptoms. For details visit [email or web address].   Also download the MyChart app! Go to the app store, search "MyChart", open the app, select [facility], and log in with your MyChart username and password.  Masks are optional in the cancer centers. If you would like for your care team to wear a mask while they are taking care of you, please let them know. You may have one support person who is at least 65 years old accompany you for your appointments.

## 2021-12-14 LAB — KAPPA/LAMBDA LIGHT CHAINS
Kappa free light chain: 47.8 mg/L — ABNORMAL HIGH (ref 3.3–19.4)
Kappa, lambda light chain ratio: 1.52 (ref 0.26–1.65)
Lambda free light chains: 31.5 mg/L — ABNORMAL HIGH (ref 5.7–26.3)

## 2021-12-15 LAB — PROTEIN ELECTROPHORESIS, SERUM
A/G Ratio: 1.1 (ref 0.7–1.7)
Albumin ELP: 3.2 g/dL (ref 2.9–4.4)
Alpha-1-Globulin: 0.4 g/dL (ref 0.0–0.4)
Alpha-2-Globulin: 0.9 g/dL (ref 0.4–1.0)
Beta Globulin: 0.9 g/dL (ref 0.7–1.3)
Gamma Globulin: 0.7 g/dL (ref 0.4–1.8)
Globulin, Total: 2.9 g/dL (ref 2.2–3.9)
M-Spike, %: 0.2 g/dL — ABNORMAL HIGH
Total Protein ELP: 6.1 g/dL (ref 6.0–8.5)

## 2021-12-15 LAB — IMMUNOFIXATION ELECTROPHORESIS
IgA: 121 mg/dL (ref 87–352)
IgG (Immunoglobin G), Serum: 644 mg/dL (ref 586–1602)
IgM (Immunoglobulin M), Srm: 60 mg/dL (ref 26–217)
Total Protein ELP: 5.8 g/dL — ABNORMAL LOW (ref 6.0–8.5)

## 2021-12-20 ENCOUNTER — Inpatient Hospital Stay (HOSPITAL_BASED_OUTPATIENT_CLINIC_OR_DEPARTMENT_OTHER): Payer: Medicare Other | Admitting: Hematology

## 2021-12-20 DIAGNOSIS — D630 Anemia in neoplastic disease: Secondary | ICD-10-CM | POA: Diagnosis not present

## 2021-12-20 DIAGNOSIS — C9000 Multiple myeloma not having achieved remission: Secondary | ICD-10-CM | POA: Diagnosis not present

## 2021-12-20 LAB — IRON AND TIBC
Iron: 50 ug/dL (ref 28–170)
Saturation Ratios: 18 % (ref 10.4–31.8)
TIBC: 276 ug/dL (ref 250–450)
UIBC: 226 ug/dL

## 2021-12-20 LAB — FERRITIN: Ferritin: 186 ng/mL (ref 11–307)

## 2021-12-20 MED ORDER — HEPARIN SOD (PORK) LOCK FLUSH 100 UNIT/ML IV SOLN
500.0000 [IU] | Freq: Once | INTRAVENOUS | Status: AC
  Administered 2021-12-20: 500 [IU] via INTRAVENOUS

## 2021-12-20 MED ORDER — SODIUM CHLORIDE 0.9% FLUSH
10.0000 mL | INTRAVENOUS | Status: DC | PRN
  Administered 2021-12-20: 10 mL

## 2021-12-20 NOTE — Patient Instructions (Addendum)
[facility] at [facility] Discharge Instructions   You were seen and examined today by Dr. Delton Coombes.  He reviewed the results of your lab work. Your hemoglobin has dropped to 8.9. We will check your iron levels and see if you need IV iron.   We will see you back in 3 months. We will repeat lab work prior to this visit.    Thank you for choosing [facility] at [facility] to provide your oncology and hematology care.  To afford each patient quality time with our provider, please arrive at least 15 minutes before your scheduled appointment time.   If you have a lab appointment with the [facility] please come in thru the Main Entrance and check in at the main information desk.  You need to re-schedule your appointment should you arrive 10 or more minutes late.  We strive to give you quality time with our providers, and arriving late affects you and other patients whose appointments are after yours.  Also, if you no show three or more times for appointments you may be dismissed from the clinic at the providers discretion.     Again, thank you for choosing [facility].  Our hope is that these requests will decrease the amount of time that you wait before being seen by our physicians.       _____________________________________________________________  Should you have questions after your visit to [facility], please contact our office at [phone] and follow the prompts.  Our office hours are 8:00 a.m. and 4:30 p.m. Monday - Friday.  Please note that voicemails left after 4:00 p.m. may not be returned until the following business day.  We are closed weekends and major holidays.  You do have access to a nurse 24-7, just call the main number to the clinic [phone] and do not press any options, hold on the line and a nurse will answer the phone.    For prescription refill requests, have your pharmacy contact our office  and allow 72 hours.    Due to Covid, you will need to wear a mask upon entering the hospital. If you do not have a mask, a mask will be given to you at the Main Entrance upon arrival. For doctor visits, patients may have 1 support person age 20 or older with them. For treatment visits, patients can not have anyone with them due to social distancing guidelines and our immunocompromised population.

## 2021-12-20 NOTE — Progress Notes (Signed)
Kelli Hurley,  [id number]   CLINIC:  Medical Oncology/Hematology  PCP:  [facility] / [place] [address] [phone]   REASON FOR VISIT:  Follow-up for multiple myeloma  PRIOR THERAPY:  1. Bortezomib x 8 cycles from 10/05/2014 to 04/06/2016. 2. Carfilzomib x 6 cycles from 03/05/2019 to 08/06/2019.  NGS Results: not done  CURRENT THERAPY: Pomalyst 4 mg 3/4 weeks  BRIEF ONCOLOGIC HISTORY:  Oncology History  Multiple myeloma ([place])  08/07/2014 Imaging   Bone Survey- No lytic lesions are noted in the visualized skeleton.   09/03/2014 Bone Marrow Biopsy   NORMOCELLULAR BONE MARROW FOR AGE WITH PLASMA CELL NEOPLASM.  The plasma cell component is increased in the marrow representing an estimated 18% of all cells. Cytogenetics with 13q-, 17p- (high risk disease)   09/03/2014 Pathology Results   Cytogenetics with 13q-, 17p- (high risk disease)   09/23/2014 Initial Diagnosis   Multiple myeloma   09/30/2014 PET scan   No abnormal hypermetabolism in the neck, chest, abdomen or pelvis.   10/05/2014 - 03/22/2015 Chemotherapy   RVD   10/28/2014 Treatment Plan Change   Issues related to getting Revlimid in a timely fashion, therefore, she received her Revlimid on 9/21 resulting in a 12 day cycle this time instead of a 14 day cycle   11/02/2014 Imaging   CTA chest- No evidence for a large or central pulmonary embolism as described.  8 mm density along the right minor fissure could represent focal pleural thickening but indeterminate. If the patient is at high risk for bronchogenic carcinoma, follow-up c   11/23/2014 Miscellaneous   Zometa 4 mg IV monthly   04/07/2015 Bone Marrow Biopsy   Normocellular marrow with 2-4% clonal plasma cells by immunohistochemistry. FISH and cytogenetics were normal [facility])     04/2015 Miscellaneous   PRETRANSPLANT EVALUATION:  Pulmonary function tests: FEV1 100.3% / DLCO 97.9%   Echocardiogram: Normal LV function with EF 60-65%    04/27/2015 Procedure   Stem cell mobilization with filgrastim and [facility])   05/06/2015 Miscellaneous   BMT conditioning regimen with high-dose Melphalan given [facility] [name]); Day -1   05/07/2015 Bone Marrow Transplant   Outpatient autologous stem cell transplant [facility], [name]); Day 0   05/18/2015 Miscellaneous   WBC engraftment;  did not require platelet transfusion during her transplant process. Lowest platelet count 28,000    05/20/2015 Procedure   Tunneled catheter removed [facility])   09/08/2015 -  Chemotherapy   Velcade every 2 weeks   12/15/2015 Miscellaneous   Zometa re-instituted.    12/23/2015 Imaging   Bone density- BMD as determined from Forearm Radius 33% is 0.799 g/cm2 with a T-Score of 1.2. This patient is considered normal according to [facility]) criteria.   04/17/2016 Miscellaneous   Started maintenance with N[place]laro.   05/04/2016 Treatment Plan Change   Zometa switched to Xgeva injection monthly given difficult IV access.    03/05/2019 - 08/06/2019 Chemotherapy   The patient had carfilzomib (KYPROLIS) 40 mg in dextrose 5 % 50 mL chemo infusion, 18 mg/m2 = 44 mg, Intravenous,  Once, 6 of 6 cycles Dose modification: 56 mg/m2 (original dose 56 mg/m2, Cycle 1, Reason: Provider Judgment) Administration: 40 mg (03/05/2019), 120 mg (03/12/2019), 120 mg (03/20/2019), 120 mg (04/02/2019), 120 mg (04/09/2019), 120 mg (04/16/2019), 120 mg (04/30/2019), 120 mg (05/08/2019), 120 mg (05/14/2019), 120 mg (05/28/2019), 120 mg (06/04/2019), 120 mg (06/11/2019), 120 mg (  06/25/2019), 120 mg (07/02/2019), 120 mg (07/09/2019), 120 mg (07/23/2019), 120 mg (07/30/2019), 120 mg (08/06/2019)  for chemotherapy treatment.      CANCER STAGING:  Cancer Staging  Multiple myeloma [facility]) Staging form: Multiple Myeloma, AJCC 6th Edition - Clinical stage from 10/26/2014: Stage IIA - Signed by Baird Cancer, PA-C on  10/26/2014 - Pathologic: No stage assigned - Unsigned   INTERVAL HISTORY:  Kelli Hurley, a 66 y.o. female, seen for follow-up of multiple myeloma.  She is taking Pomalyst 3 weeks on/1 week off with weekly dexamethasone.  Reports shortness of breath on exertion is stable.  Energy levels are 75%.  Knee pains are well controlled with the current pain regimen.  Numbness in the hands and feet has been stable.  No recent infections or hospitalizations reported.  REVIEW OF SYSTEMS:  Review of Systems  Respiratory:  Positive for shortness of breath.   Gastrointestinal:  Negative for constipation and diarrhea.  Musculoskeletal:  Positive for arthralgias (6/10 knees).  Neurological:  Positive for numbness (stable).  All other systems reviewed and are negative.   PAST MEDICAL/SURGICAL HISTORY:  Past Medical History:  Diagnosis Date   Anemia    Arthritis    Cancer ([place])    Claustrophobia 10/05/2014   Hypertension    Leukopenia 08/03/2014   Normocytic hypochromic anemia 08/03/2014   Renal disorder    stage 3    Past Surgical History:  Procedure Laterality Date   ABDOMINAL HYSTERECTOMY     CARDIAC SURGERY     68 months old. States she had a leaky valve.    COLONOSCOPY  03/26/2009   Dr. Oneida Alar; normal colon, small internal hemorrhoids.  Recommended repeat colonoscopy in 10 years.   COLONOSCOPY WITH PROPOFOL N/A 11/25/2019   Procedure: COLONOSCOPY WITH PROPOFOL;  Surgeon: Eloise Harman, DO;  Location: [facility];  Service: Endoscopy;  Laterality: N/A;  10:45am   OTHER SURGICAL HISTORY     heart surgery as infant to "repair hole in heart"   PORTACATH PLACEMENT Left 02/21/2019   Procedure: INSERTION PORT-A-CATH;  Surgeon: Virl Cagey, MD;  Location: [facility] ORS;  Service: General;  Laterality: Left;    SOCIAL HISTORY:  Social History   Socioeconomic History   Marital status: Legally Separated    Spouse name: Not on file   Number of children: Not on file   Years of  education: Not on file   Highest education level: Not on file  Occupational History   Not on file  Tobacco Use   Smoking status: Never   Smokeless tobacco: Never  Vaping Use   Vaping Use: Never used  Substance and Sexual Activity   Alcohol use: No   Drug use: No   Sexual activity: Not on file    Comment: divorced- 2 daughters  Other Topics Concern   Not on file  Social History Narrative   Not on file   Social Determinants of Health   Financial Resource Strain: Low Risk  (05/31/2020)   Overall Financial Resource Strain (CARDIA)    Difficulty of Paying Living Expenses: Not hard at all  Food Insecurity: No Food Insecurity (05/31/2020)   Hunger Vital Sign    Worried About Running Out of Food in the Last Year: Never true    [place] in the Last Year: Never true  Transportation Needs: No Transportation Needs (05/31/2020)   PRAPARE - Hydrologist (Medical): No    Lack of Transportation (Non-Medical):  No  Physical Activity: Inactive (05/31/2020)   Exercise Vital Sign    Days of Exercise per Week: 0 days    Minutes of Exercise per Session: 0 min  Stress: No Stress Concern Present (05/31/2020)   [place]    Feeling of Stress : Not at all  Social Connections: Unknown (05/31/2020)   Social Connection and Isolation Panel [NHANES]    Frequency of Communication with Friends and Family: More than three times a week    Frequency of Social Gatherings with Friends and Family: Once a week    Attends Religious Services: More than 4 times per year    Active Member of [employer] or Organizations: No    Attends Archivist Meetings: Never    Marital Status: Not on file  Intimate Partner Violence: Not At Risk (05/31/2020)   Humiliation, Afraid, Rape, and Kick questionnaire    Fear of Current or Ex-Partner: No    Emotionally Abused: No    Physically Abused: No    Sexually Abused: No     FAMILY HISTORY:  Family History  Problem Relation Age of Onset   Cancer Mother    Hypertension Mother    Cancer Father    Hypertension Father    Cancer Maternal Grandmother    Hypertension Sister    Hypertension Brother    Prostate cancer Brother    Colon cancer Neg Hx    Colon polyps Neg Hx     CURRENT MEDICATIONS:  Current Outpatient Medications  Medication Sig Dispense Refill   acetaminophen (TYLENOL) 650 MG CR tablet Take 650 mg by mouth every 8 (eight) hours as needed for pain.     acyclovir (ZOVIRAX) 400 MG tablet TAKE ONE (1) TABLET BY MOUTH TWICE DAILY 60 tablet 10   albuterol (VENTOLIN HFA) 108 (90 Base) MCG/ACT inhaler Inhale 2 puffs into the lungs every 6 (six) hours as needed for wheezing or shortness of breath. 8 g 3   albuterol (VENTOLIN HFA) 108 (90 Base) MCG/ACT inhaler 2 puffs as needed     ALPRAZolam (XANAX) 1 MG tablet Take 1 mg by mouth daily as needed.     aspirin EC 81 MG tablet Take 81 mg by mouth daily.     calcitRIOL (ROCALTROL) 0.25 MCG capsule Take by mouth.     cholecalciferol (VITAMIN D) 1000 units tablet Take 1,000 Units by mouth daily.     dapagliflozin propanediol (FARXIGA) 10 MG TABS tablet Take 1 tablet (10 mg total) by mouth daily before breakfast. 30 tablet 6   dexamethasone (DECADRON) 4 MG tablet Take 5 tablets once a week 30 tablet 6   Elastic Bandages & Supports (MEDICAL COMPRESSION STOCKINGS) MISC See admin instructions.     furosemide (LASIX) 20 MG tablet TAKE 2 TABLETS BY MOUTH DAILY IN THE MORNING AND TAKE 2 TABLETS AT BEDTIME 120 tablet 8   gabapentin (NEURONTIN) 300 MG capsule TAKE 1 CAPSULE BY MOUTH THREE TIMES DAILY 90 capsule 10   Infant Care Products (DERMACLOUD) OINT Apply to affected area as needed Externally for 30 days     lidocaine-prilocaine (EMLA) cream APPLY A QUATER SIZED AMOUNT 1 HOUR PRIOR TO TREATMENT 30 g 10   losartan (COZAAR) 25 MG tablet Take 12.5 mg by mouth daily.     magnesium oxide (MAG-OX) 400 (240 Mg) MG  tablet TAKE 1 TABLET BY MOUTH TWICE DAILY 60 tablet 6   metolazone (ZAROXOLYN) 2.5 MG tablet Take 1 tablet (2.5 mg total)  by mouth daily. 30 tablet 6   Multiple Vitamin (TAB-A-VITE) TABS TAKE 1 TABLET BY MOUTH ONCE DAILY. (Patient taking differently: Take 1 tablet by mouth daily.) 30 tablet 11   oxyCODONE-acetaminophen (PERCOCET/ROXICET) 5-325 MG tablet Take 1-2 tablets by mouth every 12 (twelve) hours as needed for severe pain. 60 tablet 0   pomalidomide (POMALYST) 4 MG capsule Take 1 capsule (4 mg total) by mouth daily. 21 days on, 7 days off 21 capsule 0   potassium chloride SA (KLOR-CON M) 20 MEQ tablet TAKE 1 TABLET BY MOUTH THREE TIMES DAILY 90 tablet 10   rosuvastatin (CRESTOR) 10 MG tablet Take 1 tablet (10 mg total) by mouth daily. 90 tablet 3   Semaglutide, 1 MG/DOSE, 4 MG/3ML SOPN Inject 1 mg into the skin once a week. 3 mL 11   spironolactone (ALDACTONE) 25 MG tablet Take 12.5 mg by mouth daily.     tiZANidine (ZANAFLEX) 4 MG capsule Take 1 capsule (4 mg total) by mouth 3 (three) times daily as needed for muscle spasms. Do not drink alcohol or drive while taking this medication.  May cause drowsiness. 15 capsule 0   trolamine salicylate (ASPERCREME) 10 % cream Apply 1 application topically 2 (two) times daily as needed for muscle pain.     No current facility-administered medications for this visit.   Facility-Administered Medications Ordered in Other Visits  Medication Dose Route Frequency Provider Last Rate Last Admin   heparin lock flush 100 unit/mL  500 Units Intravenous Once Penland, Kelby Fam, MD       sodium chloride 0.9 % injection 10 mL  10 mL Intravenous Once Penland, Kelby Fam, MD        ALLERGIES:  Allergies  Allergen Reactions   Diclofenac Swelling    Per pt, facial swelling    PHYSICAL EXAM:  Performance status (ECOG): 1 - Symptomatic but completely ambulatory  Vitals:   12/20/21 0951  BP: (!) 121/49  Pulse: 85  Resp: 20  Temp: 98.5 F (36.9 C)  SpO2:  96%   Wt Readings from Last 3 Encounters:  12/20/21 (!) 319 lb 3.2 oz (144.8 kg)  11/24/21 (!) 325 lb (147.4 kg)  10/21/21 (!) 325 lb (147.4 kg)   Physical Exam Vitals reviewed.  Constitutional:      Appearance: Normal appearance. She is obese.  Cardiovascular:     Rate and Rhythm: Normal rate and regular rhythm.     Pulses: Normal pulses.     Heart sounds: Normal heart sounds.  Pulmonary:     Effort: Pulmonary effort is normal.     Breath sounds: Normal breath sounds.  Musculoskeletal:     Right lower leg: 1+ Edema present.     Left lower leg: 1+ Edema present.  Neurological:     General: No focal deficit present.     Mental Status: She is alert and oriented to person, place, and time.  Psychiatric:        Mood and Affect: Mood normal.        Behavior: Behavior normal.     LABORATORY DATA:  I have reviewed the labs as listed.     Latest Ref Rng & Units 12/13/2021   11:25 AM 09/14/2021   11:00 AM 06/15/2021   10:08 AM  CBC  WBC 4.0 - 10.5 K/uL 4.0  9.5  6.7   Hemoglobin 12.0 - 15.0 g/dL 8.7  10.6  10.6   Hematocrit 36.0 - 46.0 % 26.8  33.2  33.0   Platelets  150 - 400 K/uL 274  285  298       Latest Ref Rng & Units 12/13/2021   11:25 AM 09/14/2021   11:00 AM 04/21/2021    9:30 AM  CMP  Glucose 70 - 99 mg/dL 129  137  98   BUN 8 - 23 mg/dL 41  46  43   Creatinine 0.44 - 1.00 mg/dL 1.84  1.66  1.88   Sodium 135 - 145 mmol/L 135  136  135   Potassium 3.5 - 5.1 mmol/L 3.9  3.4  3.8   Chloride 98 - 111 mmol/L 99  95  94   CO2 22 - 32 mmol/L _0 Calcium 8.9 - 10.3 mg/dL 9.2  9.8  9.7   Total Protein 6.5 - 8.1 g/dL 6.9  7.2    Total Bilirubin 0.3 - 1.2 mg/dL 0.4  0.5    Alkaline Phos 38 - 126 U/L 49  48    AST 15 - 41 U/L 16  14    ALT 0 - 44 U/L 13  14      DIAGNOSTIC IMAGING:  I have independently reviewed the scans and discussed with the patient. No results found.   ASSESSMENT:  1.  Relapsed IgG kappa multiple myeloma with high risk features: -5 cycles  of carfilzomib, pomalidomide and dexamethasone from 03/05/2019 through 06/25/2019. -Myeloma panel on 07/09/2019 shows M spike 0.3 g, slightly up from 0.2 g previously.  However free light chain ratio has 1.37 and improved.  Kappa light chains are 24.8. - Carfilzomib discontinued after 08/06/2019 secondary to fluid retention. - She is currently on Pomalyst 4 mg 3 weeks on 1 week off. - PET scan on 03/22/2020 with no evidence of malignancy. - Bone marrow biopsy on 03/19/2020 showed approximately 5% plasma cells.  Chromosome analysis and FISH panel were normal.   2.  Shortness of breath on exertion: -2D echo on 02/09/2019 shows EF 60-65%.  No LVH. -Chest CT PE protocol on 02/24/2019 was negative. -Cardiac MRI on 03/07/2019 shows LVEF 56% with no amyloid.  Troponin T and proBNP were negative. -PFTs show low expiratory reserve volume consistent with body habitus.  Moderate diffusion defect which corrects to normal.  FVC, FEV1, FEV1/FVC ratio are within normal limits.  FVC is reduced relative to SVC indicates air-trapping.  No significant response after bronchodilators.  Reduced diffusion capacity indicates moderate loss of functional alveolar capillary space. -She was evaluated by Dr. Melvyn Novas. -2D echocardiogram on 08/27/2019 shows LVEF 60 to 65%.  No LVH.   3.  Myeloma bone disease: -Bone density on 07/04/2019 shows T score 1.8.   PLAN:  1.  Relapsed IgG kappa multiple myeloma with high risk features: - Reviewed myeloma labs from 12/13/2021 which showed M spike is stable at 0.2 g.  Free light chain ratio is also stable at 1.52.  Kappa light chains are 47, up from 24.  Lambda light chains are also up from 17-31.  Immunofixation shows IgG kappa.  White count and platelet count is normal.  Creatinine is 1.84 and rest of LFTs are normal. - Recommend continuing Pomalyst 4 mg 3 weeks on/1 week off and dexamethasone 20 mg weekly. - RTC 3 months for follow-up.   2.  Normocytic anemia: - Hemoglobin is 8.7, down from  10.6 on 09/14/2021. - We have sent ferritin and iron panel today.  Previous ferritin was around 100.  She has CKD. - Recommend Feraheme weekly x2.  We will also  check stool for occult blood.   3.  ID prophylaxis: - Continue acyclovir twice daily.  Continue aspirin 81 mg for thromboprophylaxis.   4.  Neuropathy: - Predominantly in the hands and feet stable.  Continue gabapentin 300 mg 3 times daily.   5.  Lower extremity edema: - Continue Lasix 40 mg twice daily and metolazone 2.5 mg on Monday, Wednesday and Friday.  Creatinine is 1.84.  Continue follow-up with Dr. Theador Hawthorne.   6.  Myeloma bone disease: - Zometa held after last infusion on 01/27/2020 due to worsening renal function.  She already received 2 years worth of it.   7.  Hypokalemia: - Continue potassium 2 tablets daily at home.   8.  Bilateral knee pains: - Continue Percocet 5/325 twice daily as needed.   Orders placed this encounter:  No orders of the defined types were placed in this encounter.     Derek Jack, MD [place] [phone]

## 2021-12-20 NOTE — Progress Notes (Signed)
Patient is taking Pomalyst as prescribed.  She has not missed any doses and reports no side effects at this time.   

## 2021-12-26 ENCOUNTER — Inpatient Hospital Stay: Payer: Medicare Other

## 2021-12-26 ENCOUNTER — Other Ambulatory Visit: Payer: Self-pay

## 2021-12-26 VITALS — BP 104/60 | HR 77 | Temp 97.7°F | Resp 18

## 2021-12-26 DIAGNOSIS — D630 Anemia in neoplastic disease: Secondary | ICD-10-CM

## 2021-12-26 DIAGNOSIS — C9000 Multiple myeloma not having achieved remission: Secondary | ICD-10-CM | POA: Diagnosis not present

## 2021-12-26 DIAGNOSIS — C9001 Multiple myeloma in remission: Secondary | ICD-10-CM

## 2021-12-26 LAB — OCCULT BLOOD X 1 CARD TO LAB, STOOL
Fecal Occult Bld: NEGATIVE
Fecal Occult Bld: NEGATIVE
Fecal Occult Bld: POSITIVE — AB

## 2021-12-26 MED ORDER — SODIUM CHLORIDE 0.9 % IV SOLN: Freq: Once | INTRAVENOUS | Status: AC

## 2021-12-26 MED ORDER — SODIUM CHLORIDE 0.9 % IV SOLN
510.0000 mg | Freq: Once | INTRAVENOUS | Status: AC
  Administered 2021-12-26: 510 mg via INTRAVENOUS
  Filled 2021-12-26: qty 17

## 2021-12-26 MED ORDER — HEPARIN SOD (PORK) LOCK FLUSH 100 UNIT/ML IV SOLN
500.0000 [IU] | Freq: Once | INTRAVENOUS | Status: AC | PRN
  Administered 2021-12-26: 500 [IU]

## 2021-12-26 MED ORDER — SODIUM CHLORIDE 0.9% FLUSH
10.0000 mL | Freq: Once | INTRAVENOUS | Status: AC | PRN
  Administered 2021-12-26: 10 mL

## 2021-12-26 NOTE — Patient Instructions (Signed)
[facility]  Discharge Instructions: Thank you for choosing [facility] to provide your oncology and hematology care.  If you have a lab appointment with the Cancer Center, please come in thru the Main Entrance and check in at the main information desk.  Wear comfortable clothing and clothing appropriate for easy access to any Portacath or PICC line.   We strive to give you quality time with your provider. You may need to reschedule your appointment if you arrive late (15 or more minutes).  Arriving late affects you and other patients whose appointments are after yours.  Also, if you miss three or more appointments without notifying the office, you may be dismissed from the clinic at the provider's discretion.      For prescription refill requests, have your pharmacy contact our office and allow 72 hours for refills to be completed.    Today you received the following Feraheme, return as scheduled.   To help prevent nausea and vomiting after your treatment, we encourage you to take your nausea medication as directed.  BELOW ARE SYMPTOMS THAT SHOULD BE REPORTED IMMEDIATELY: *FEVER GREATER THAN 100.4 F (38 C) OR HIGHER *CHILLS OR SWEATING *NAUSEA AND VOMITING THAT IS NOT CONTROLLED WITH YOUR NAUSEA MEDICATION *UNUSUAL SHORTNESS OF BREATH *UNUSUAL BRUISING OR BLEEDING *URINARY PROBLEMS (pain or burning when urinating, or frequent urination) *BOWEL PROBLEMS (unusual diarrhea, constipation, pain near the anus) TENDERNESS IN MOUTH AND THROAT WITH OR WITHOUT PRESENCE OF ULCERS (sore throat, sores in mouth, or a toothache) UNUSUAL RASH, SWELLING OR PAIN  UNUSUAL VAGINAL DISCHARGE OR ITCHING   Items with * indicate a potential emergency and should be followed up as soon as possible or go to the Emergency Department if any problems should occur.  Please show the CHEMOTHERAPY ALERT CARD or IMMUNOTHERAPY ALERT CARD at check-in to the Emergency Department and  triage nurse.  Should you have questions after your visit or need to cancel or reschedule your appointment, please contact [facility] [phone]  and follow the prompts.  Office hours are 8:00 a.m. to 4:30 p.m. Monday - Friday. Please note that voicemails left after 4:00 p.m. may not be returned until the following business day.  We are closed weekends and major holidays. You have access to a nurse at all times for urgent questions. Please call the main number to the clinic [phone] and follow the prompts.  For any non-urgent questions, you may also contact your provider using MyChart. We now offer e-Visits for anyone 18 and older to request care online for non-urgent symptoms. For details visit [email or web address].   Also download the MyChart app! Go to the app store, search "MyChart", open the app, select [place], and log in with your MyChart username and password.  Masks are optional in the cancer centers. If you would like for your care team to wear a mask while they are taking care of you, please let them know. You may have one support person who is at least 65 years old accompany you for your appointments.  

## 2021-12-26 NOTE — Progress Notes (Signed)
Patient presents today for [facility], patient reports taking tylenol and claritin at home. Patient tolerated iron infusion with no complaints voiced. Port site clean and dry with good blood return noted before and after infusion. Band aid applied. VSS with discharge and left in satisfactory condition with no s/s of distress noted.

## 2022-01-02 ENCOUNTER — Inpatient Hospital Stay: Payer: Medicare Other

## 2022-01-02 VITALS — BP 107/58 | HR 82 | Temp 98.0°F | Resp 18

## 2022-01-02 DIAGNOSIS — C9000 Multiple myeloma not having achieved remission: Secondary | ICD-10-CM | POA: Diagnosis not present

## 2022-01-02 DIAGNOSIS — C9001 Multiple myeloma in remission: Secondary | ICD-10-CM

## 2022-01-02 MED ORDER — SODIUM CHLORIDE 0.9% FLUSH
10.0000 mL | Freq: Once | INTRAVENOUS | Status: AC
  Administered 2022-01-02: 10 mL via INTRAVENOUS

## 2022-01-02 MED ORDER — SODIUM CHLORIDE 0.9 % IV SOLN: Freq: Once | INTRAVENOUS | Status: AC

## 2022-01-02 MED ORDER — ACETAMINOPHEN 325 MG PO TABS: 650.0000 mg | ORAL_TABLET | Freq: Once | ORAL | Status: DC

## 2022-01-02 MED ORDER — SODIUM CHLORIDE 0.9 % IV SOLN
510.0000 mg | Freq: Once | INTRAVENOUS | Status: AC
  Administered 2022-01-02: 510 mg via INTRAVENOUS
  Filled 2022-01-02: qty 17

## 2022-01-02 MED ORDER — LORATADINE 10 MG PO TABS: 10.0000 mg | ORAL_TABLET | Freq: Once | ORAL | Status: DC

## 2022-01-02 MED ORDER — HEPARIN SOD (PORK) LOCK FLUSH 100 UNIT/ML IV SOLN
500.0000 [IU] | Freq: Once | INTRAVENOUS | Status: AC
  Administered 2022-01-02: 500 [IU] via INTRAVENOUS

## 2022-01-02 NOTE — Patient Instructions (Signed)
[facility] [place]  Discharge Instructions: Thank you for choosing [facility] to provide your oncology and hematology care.  If you have a lab appointment with the Cancer Center, please come in thru the Main Entrance and check in at the main information desk.  Wear comfortable clothing and clothing appropriate for easy access to any Portacath or PICC line.   We strive to give you quality time with your provider. You may need to reschedule your appointment if you arrive late (15 or more minutes).  Arriving late affects you and other patients whose appointments are after yours.  Also, if you miss three or more appointments without notifying the office, you may be dismissed from the clinic at the provider's discretion.      For prescription refill requests, have your pharmacy contact our office and allow 72 hours for refills to be completed.    Today you received the following chemotherapy and/or immunotherapy agents Feraheme      To help prevent nausea and vomiting after your treatment, we encourage you to take your nausea medication as directed.  BELOW ARE SYMPTOMS THAT SHOULD BE REPORTED IMMEDIATELY: *FEVER GREATER THAN 100.4 F (38 C) OR HIGHER *CHILLS OR SWEATING *NAUSEA AND VOMITING THAT IS NOT CONTROLLED WITH YOUR NAUSEA MEDICATION *UNUSUAL SHORTNESS OF BREATH *UNUSUAL BRUISING OR BLEEDING *URINARY PROBLEMS (pain or burning when urinating, or frequent urination) *BOWEL PROBLEMS (unusual diarrhea, constipation, pain near the anus) TENDERNESS IN MOUTH AND THROAT WITH OR WITHOUT PRESENCE OF ULCERS (sore throat, sores in mouth, or a toothache) UNUSUAL RASH, SWELLING OR PAIN  UNUSUAL VAGINAL DISCHARGE OR ITCHING   Items with * indicate a potential emergency and should be followed up as soon as possible or go to the Emergency Department if any problems should occur.  Please show the CHEMOTHERAPY ALERT CARD or IMMUNOTHERAPY ALERT CARD at check-in to the  Emergency Department and triage nurse.  Should you have questions after your visit or need to cancel or reschedule your appointment, please contact [facility] [place] [facility] [phone]  and follow the prompts.  Office hours are 8:00 a.m. to 4:30 p.m. Monday - Friday. Please note that voicemails left after 4:00 p.m. may not be returned until the following business day.  We are closed weekends and major holidays. You have access to a nurse at all times for urgent questions. Please call the main number to the clinic [phone] and follow the prompts.  For any non-urgent questions, you may also contact your provider using MyChart. We now offer e-Visits for anyone 18 and older to request care online for non-urgent symptoms. For details visit [email or web address].   Also download the MyChart app! Go to the app store, search "MyChart", open the app, select [place], and log in with your MyChart username and password.  Masks are optional in the cancer centers. If you would like for your care team to wear a mask while they are taking care of you, please let them know. You may have one support person who is at least 65 years old accompany you for your appointments.  

## 2022-01-02 NOTE — Progress Notes (Signed)
Patient presents today for Feraheme infusion per providers order.  Vital signs WNL.  Patient has no new complaints at this time.  Feraheme infusion without adverse affects.  Vital signs stable.  No complaints at this time.  Discharge from clinic ambulatory in stable condition.  Alert and oriented X 3.  Follow up with [facility] as scheduled.

## 2022-01-10 ENCOUNTER — Other Ambulatory Visit: Payer: Self-pay

## 2022-01-10 MED ORDER — POMALIDOMIDE 4 MG PO CAPS: 4.0000 mg | ORAL_CAPSULE | Freq: Every day | ORAL | 0 refills | Status: DC

## 2022-01-10 NOTE — Telephone Encounter (Signed)
Chart reviewed. Pomalyst refilled per last office note with Dr. Katragadda.  

## 2022-01-12 ENCOUNTER — Telehealth: Payer: Self-pay | Admitting: Internal Medicine

## 2022-01-12 NOTE — Telephone Encounter (Signed)
Patient states that her semaglutide did not come with enough needles. She spoke with her pharmacist who states that she could purchase some but was unsure whether insurance would pay. I am not sure how to address this. Will check with PharmD to see if they have any suggestions.

## 2022-01-12 NOTE — Telephone Encounter (Signed)
Called patient suggest to call manufacturer - 1 800 number from the box. Patient reports she still have one needle; will be good for coming Tuesday's dose and meanwhile hopefully she will receive some from [facility]. If not we have some at [facility] line location that we could spare.

## 2022-01-12 NOTE — Telephone Encounter (Signed)
Pt c/o medication issue:  1. Name of Medication:   Semaglutide, 1 MG/DOSE, 4 MG/3ML SOPN      2. How are you currently taking this medication (dosage and times per day)?    3. Are you having a reaction (difficulty breathing--STAT)?   4. What is your medication issue? Pt states she has enough medication but she needs some more needles to inject medication. She states the pharmacy told her to ask our office to see how much insurance will pay for it. Please advise.

## 2022-01-24 ENCOUNTER — Other Ambulatory Visit: Payer: Self-pay

## 2022-01-24 DIAGNOSIS — G8929 Other chronic pain: Secondary | ICD-10-CM

## 2022-01-24 MED ORDER — OXYCODONE-ACETAMINOPHEN 5-325 MG PO TABS: 1.0000 | ORAL_TABLET | Freq: Two times a day (BID) | ORAL | 0 refills | Status: DC | PRN

## 2022-02-08 ENCOUNTER — Other Ambulatory Visit: Payer: Self-pay

## 2022-02-08 MED ORDER — DAPAGLIFLOZIN PROPANEDIOL 10 MG PO TABS: 10.0000 mg | ORAL_TABLET | Freq: Every day | ORAL | 8 refills | Status: DC

## 2022-02-09 ENCOUNTER — Other Ambulatory Visit: Payer: Self-pay

## 2022-02-09 MED ORDER — POMALIDOMIDE 4 MG PO CAPS: 4.0000 mg | ORAL_CAPSULE | Freq: Every day | ORAL | 0 refills | Status: DC

## 2022-02-09 NOTE — Telephone Encounter (Signed)
Chart reviewed. Pomalyst refilled per last office note with Dr. Katragadda.  

## 2022-02-15 ENCOUNTER — Inpatient Hospital Stay: Payer: Medicare Other | Attending: Hematology

## 2022-02-15 VITALS — BP 123/91 | HR 81 | Temp 96.7°F | Resp 20

## 2022-02-15 DIAGNOSIS — D630 Anemia in neoplastic disease: Secondary | ICD-10-CM | POA: Diagnosis not present

## 2022-02-15 DIAGNOSIS — C9000 Multiple myeloma not having achieved remission: Secondary | ICD-10-CM | POA: Insufficient documentation

## 2022-02-15 DIAGNOSIS — C9001 Multiple myeloma in remission: Secondary | ICD-10-CM

## 2022-02-15 DIAGNOSIS — Z79899 Other long term (current) drug therapy: Secondary | ICD-10-CM | POA: Diagnosis not present

## 2022-02-15 LAB — CBC WITH DIFFERENTIAL/PLATELET
Abs Immature Granulocytes: 0.16 10*3/uL — ABNORMAL HIGH (ref 0.00–0.07)
Basophils Absolute: 0 10*3/uL (ref 0.0–0.1)
Basophils Relative: 0 %
Eosinophils Absolute: 0 10*3/uL (ref 0.0–0.5)
Eosinophils Relative: 0 %
HCT: 27.1 % — ABNORMAL LOW (ref 36.0–46.0)
Hemoglobin: 8.6 g/dL — ABNORMAL LOW (ref 12.0–15.0)
Immature Granulocytes: 2 %
Lymphocytes Relative: 12 %
Lymphs Abs: 0.9 10*3/uL (ref 0.7–4.0)
MCH: 30.6 pg (ref 26.0–34.0)
MCHC: 31.7 g/dL (ref 30.0–36.0)
MCV: 96.4 fL (ref 80.0–100.0)
Monocytes Absolute: 0.8 10*3/uL (ref 0.1–1.0)
Monocytes Relative: 10 %
Neutro Abs: 5.7 10*3/uL (ref 1.7–7.7)
Neutrophils Relative %: 76 %
Platelets: 355 10*3/uL (ref 150–400)
RBC: 2.81 MIL/uL — ABNORMAL LOW (ref 3.87–5.11)
RDW: 16.3 % — ABNORMAL HIGH (ref 11.5–15.5)
WBC: 7.6 10*3/uL (ref 4.0–10.5)
nRBC: 0 % (ref 0.0–0.2)

## 2022-02-15 LAB — FERRITIN: Ferritin: 1004 ng/mL — ABNORMAL HIGH (ref 11–307)

## 2022-02-15 LAB — COMPREHENSIVE METABOLIC PANEL
ALT: 18 U/L (ref 0–44)
AST: 14 U/L — ABNORMAL LOW (ref 15–41)
Albumin: 3.3 g/dL — ABNORMAL LOW (ref 3.5–5.0)
Alkaline Phosphatase: 64 U/L (ref 38–126)
Anion gap: 15 (ref 5–15)
BUN: 50 mg/dL — ABNORMAL HIGH (ref 8–23)
CO2: 26 mmol/L (ref 22–32)
Calcium: 9.2 mg/dL (ref 8.9–10.3)
Chloride: 93 mmol/L — ABNORMAL LOW (ref 98–111)
Creatinine, Ser: 2.69 mg/dL — ABNORMAL HIGH (ref 0.44–1.00)
GFR, Estimated: 19 mL/min — ABNORMAL LOW (ref >60)
Glucose, Bld: 140 mg/dL — ABNORMAL HIGH (ref 70–99)
Potassium: 4 mmol/L (ref 3.5–5.1)
Sodium: 134 mmol/L — ABNORMAL LOW (ref 135–145)
Total Bilirubin: 0.4 mg/dL (ref 0.3–1.2)
Total Protein: 7 g/dL (ref 6.5–8.1)

## 2022-02-15 LAB — IRON AND TIBC
Iron: 102 ug/dL (ref 28–170)
Saturation Ratios: 50 % — ABNORMAL HIGH (ref 10.4–31.8)
TIBC: 204 ug/dL — ABNORMAL LOW (ref 250–450)
UIBC: 102 ug/dL

## 2022-02-15 IMAGING — US US RENAL
1 series · 14 of 25 positions shown · non-contrast
Comparison: PET-CT 03/22/2020

CLINICAL DATA: Stage III B chronic kidney disease. History of
myeloma.

EXAM:
RENAL / URINARY TRACT ULTRASOUND COMPLETE

[Series 1: us renal · 14 of 43 slices shown]
[im 1/43]
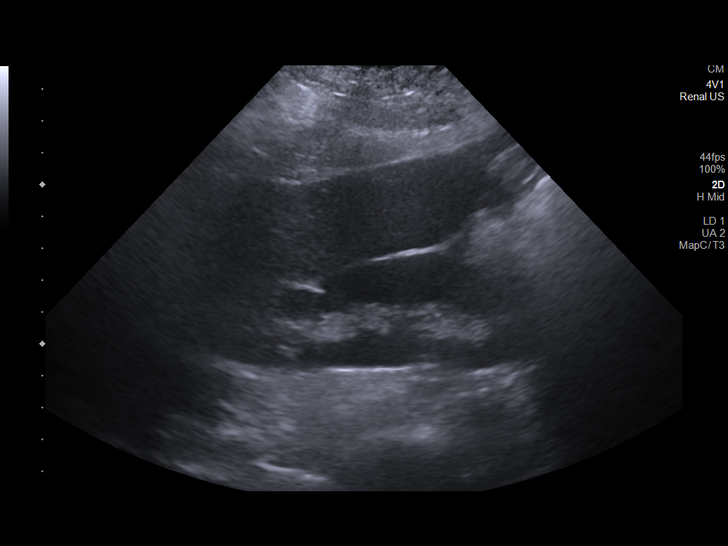
[im 4/43]
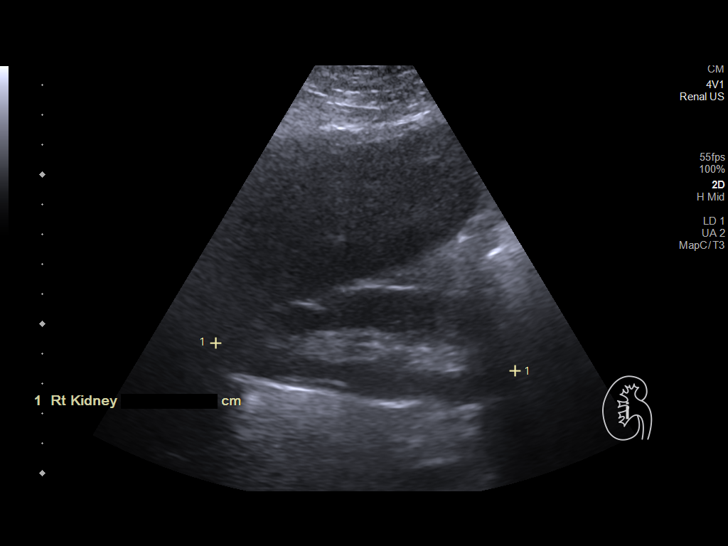
[im 8/43]
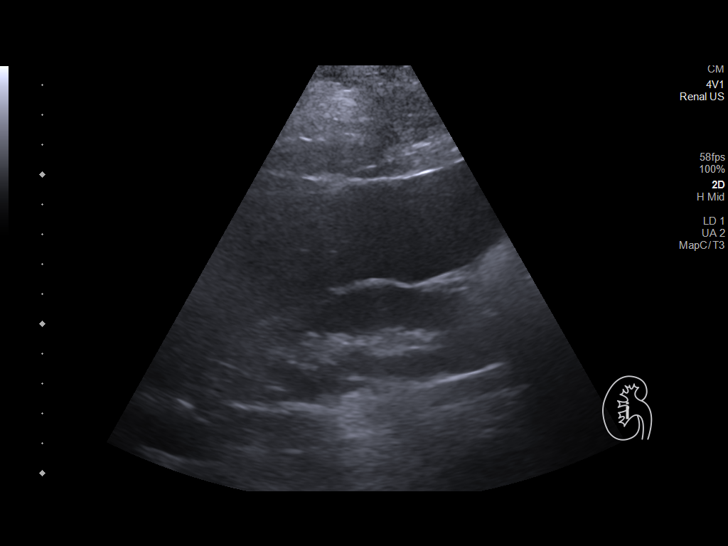
[im 11/43]
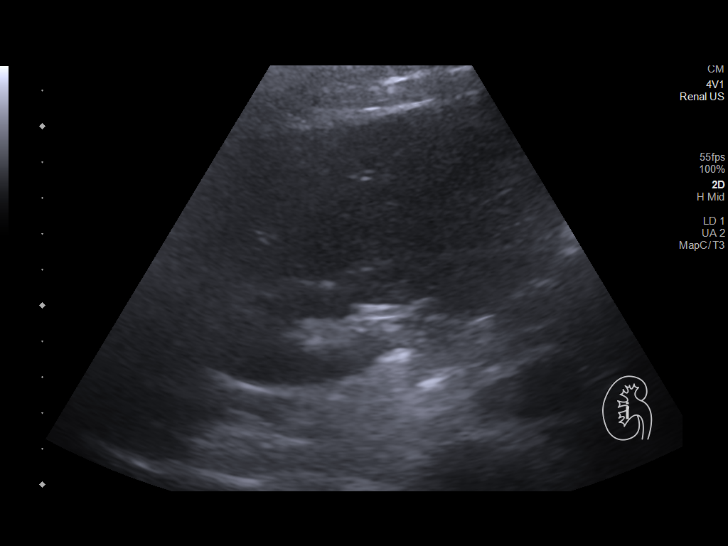
[im 15/43]
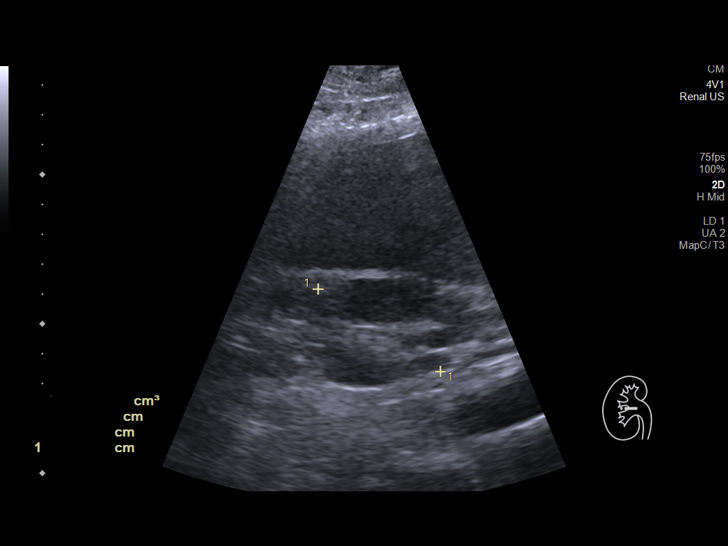
[im 16/43]
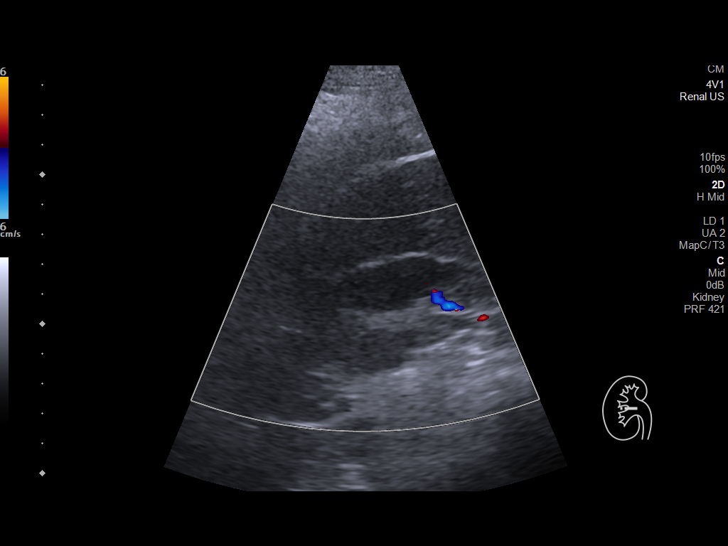
[im 20/43]
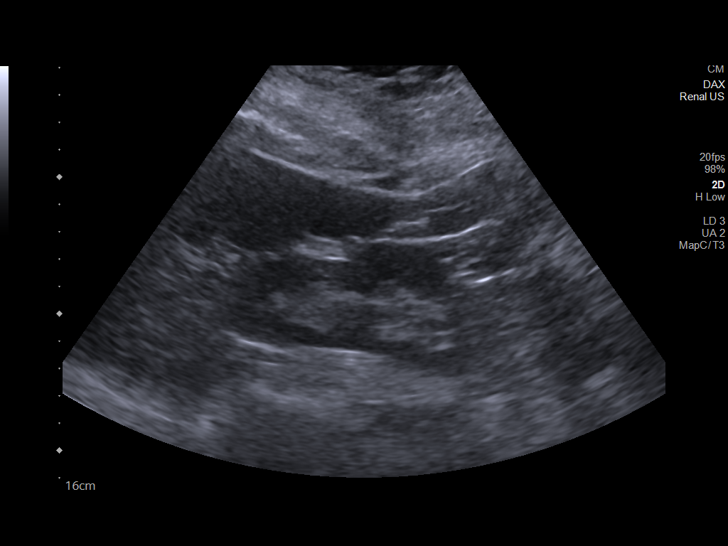
[im 23/43]
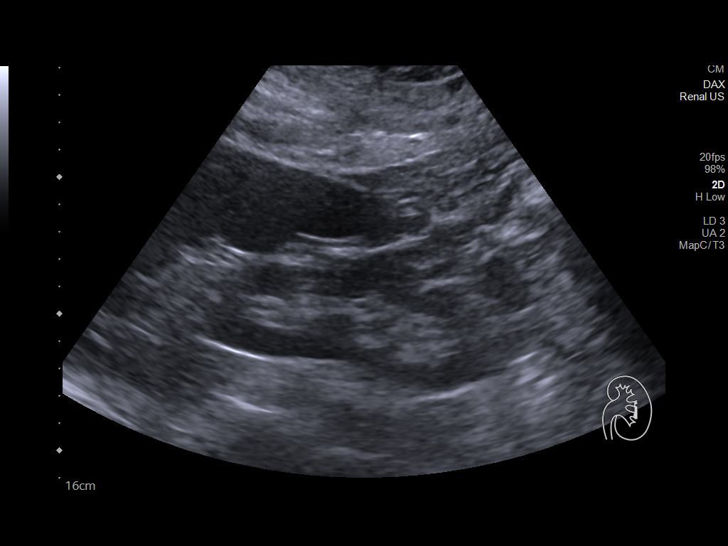
[im 27/43]
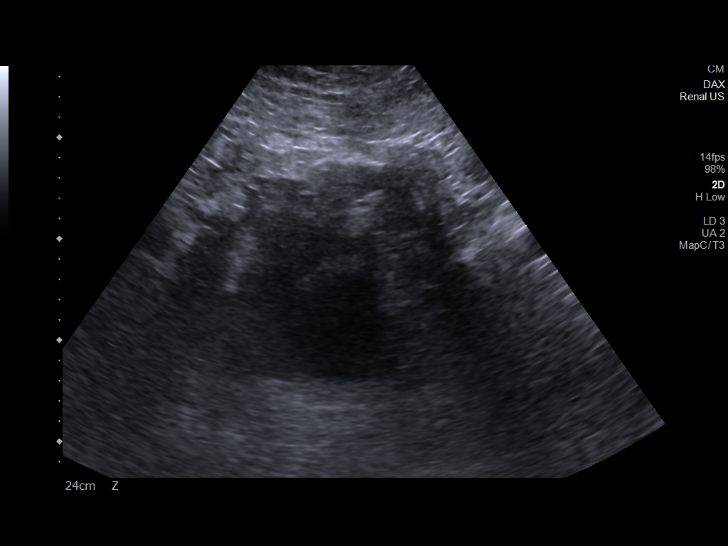
[im 29/43]
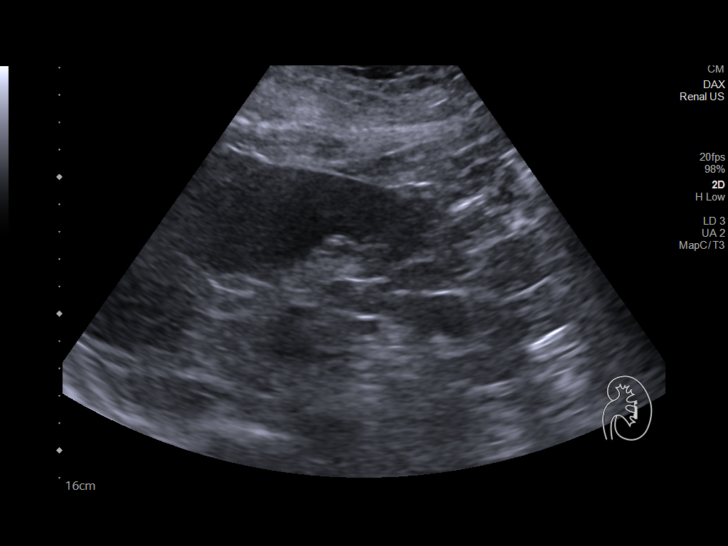
[im 32/43]
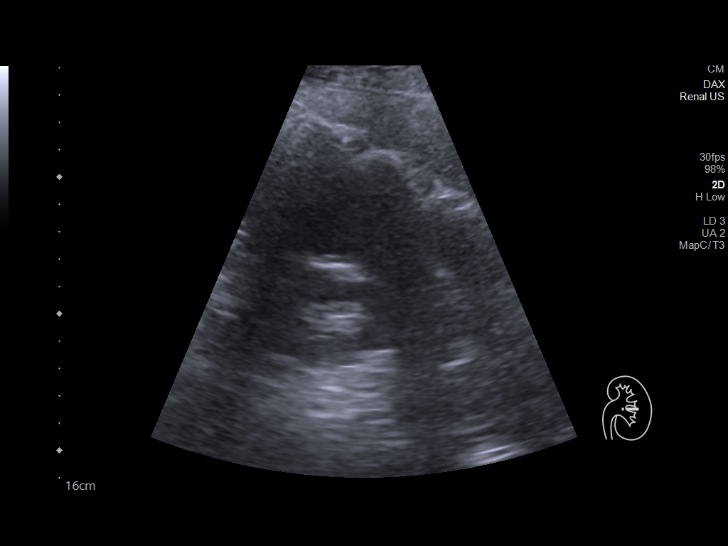
[im 36/43]
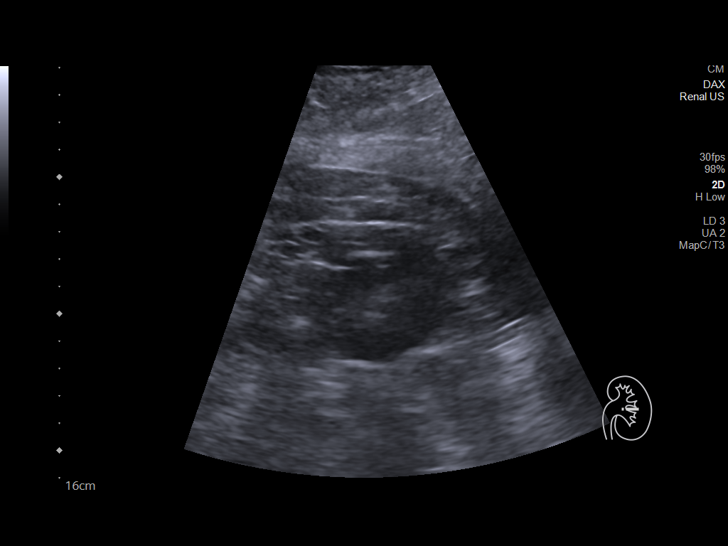
[im 39/43]
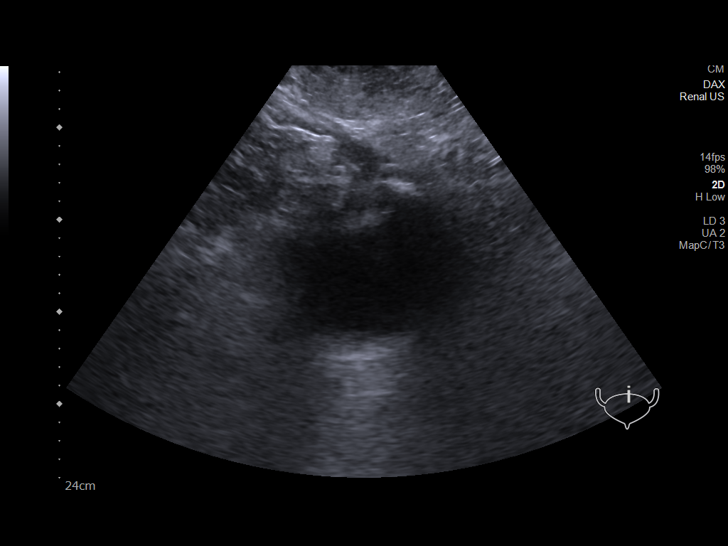
[im 43/43]
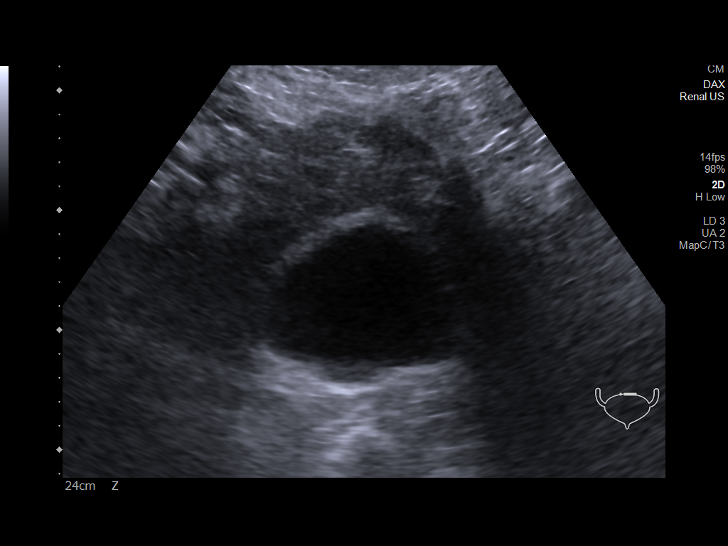

[14 of 25 positions shown; findings below may reference images not displayed]

FINDINGS: Right Kidney:

Renal measurements: 10.4 by 3.9 by 4.9 cm = volume: 106 mL.
Echogenicity within normal limits. No mass or hydronephrosis
visualized.

Left Kidney:

Renal measurements: 10.9 by 4.0 by 4.8 cm = volume: 109 mL.
Echogenicity within normal limits. No mass or hydronephrosis
visualized.

Bladder:

Appears normal for degree of bladder distention.

Other:

Body habitus and bowel gas provided some limitations on windows for
imaging the kidneys.
IMPRESSION: 1. No specific sonographic abnormality of the kidneys is identified.
Mildly reduced sensitivity due to body habitus and bowel gas.

## 2022-02-15 MED ORDER — SODIUM CHLORIDE 0.9% FLUSH
10.0000 mL | Freq: Once | INTRAVENOUS | Status: AC | PRN
  Administered 2022-02-15: 10 mL

## 2022-02-15 MED ORDER — HEPARIN SOD (PORK) LOCK FLUSH 100 UNIT/ML IV SOLN
500.0000 [IU] | Freq: Once | INTRAVENOUS | Status: AC | PRN
  Administered 2022-02-15: 500 [IU]

## 2022-03-01 ENCOUNTER — Other Ambulatory Visit: Payer: Self-pay | Admitting: Hematology

## 2022-03-01 DIAGNOSIS — G8929 Other chronic pain: Secondary | ICD-10-CM

## 2022-03-07 ENCOUNTER — Other Ambulatory Visit: Payer: Self-pay

## 2022-03-07 MED ORDER — POMALIDOMIDE 4 MG PO CAPS: 4.0000 mg | ORAL_CAPSULE | Freq: Every day | ORAL | 0 refills | Status: DC

## 2022-03-07 NOTE — Telephone Encounter (Signed)
Chart reviewed. Revlimid refilled per last office note with Dr. Katragadda.  

## 2022-03-15 ENCOUNTER — Encounter (HOSPITAL_COMMUNITY): Payer: Self-pay | Admitting: Hematology

## 2022-03-22 ENCOUNTER — Inpatient Hospital Stay: Payer: 59 | Attending: Hematology

## 2022-03-22 DIAGNOSIS — D649 Anemia, unspecified: Secondary | ICD-10-CM | POA: Diagnosis not present

## 2022-03-22 DIAGNOSIS — M25562 Pain in left knee: Secondary | ICD-10-CM | POA: Insufficient documentation

## 2022-03-22 DIAGNOSIS — G629 Polyneuropathy, unspecified: Secondary | ICD-10-CM | POA: Diagnosis not present

## 2022-03-22 DIAGNOSIS — R0602 Shortness of breath: Secondary | ICD-10-CM | POA: Insufficient documentation

## 2022-03-22 DIAGNOSIS — Z809 Family history of malignant neoplasm, unspecified: Secondary | ICD-10-CM | POA: Insufficient documentation

## 2022-03-22 DIAGNOSIS — C9000 Multiple myeloma not having achieved remission: Secondary | ICD-10-CM | POA: Insufficient documentation

## 2022-03-22 DIAGNOSIS — M25561 Pain in right knee: Secondary | ICD-10-CM | POA: Insufficient documentation

## 2022-03-22 DIAGNOSIS — Z79899 Other long term (current) drug therapy: Secondary | ICD-10-CM | POA: Diagnosis not present

## 2022-03-22 DIAGNOSIS — Z8249 Family history of ischemic heart disease and other diseases of the circulatory system: Secondary | ICD-10-CM | POA: Insufficient documentation

## 2022-03-22 DIAGNOSIS — R600 Localized edema: Secondary | ICD-10-CM | POA: Insufficient documentation

## 2022-03-22 DIAGNOSIS — E876 Hypokalemia: Secondary | ICD-10-CM | POA: Insufficient documentation

## 2022-03-22 DIAGNOSIS — Z8042 Family history of malignant neoplasm of prostate: Secondary | ICD-10-CM | POA: Insufficient documentation

## 2022-03-22 LAB — COMPREHENSIVE METABOLIC PANEL
ALT: 13 U/L (ref 0–44)
AST: 16 U/L (ref 15–41)
Albumin: 3.8 g/dL (ref 3.5–5.0)
Alkaline Phosphatase: 67 U/L (ref 38–126)
Anion gap: 14 (ref 5–15)
BUN: 54 mg/dL — ABNORMAL HIGH (ref 8–23)
CO2: 28 mmol/L (ref 22–32)
Calcium: 9.4 mg/dL (ref 8.9–10.3)
Chloride: 88 mmol/L — ABNORMAL LOW (ref 98–111)
Creatinine, Ser: 2.92 mg/dL — ABNORMAL HIGH (ref 0.44–1.00)
GFR, Estimated: 17 mL/min — ABNORMAL LOW (ref >60)
Glucose, Bld: 151 mg/dL — ABNORMAL HIGH (ref 70–99)
Potassium: 3.2 mmol/L — ABNORMAL LOW (ref 3.5–5.1)
Sodium: 130 mmol/L — ABNORMAL LOW (ref 135–145)
Total Bilirubin: 0.4 mg/dL (ref 0.3–1.2)
Total Protein: 7.1 g/dL (ref 6.5–8.1)

## 2022-03-22 LAB — LACTATE DEHYDROGENASE: LDH: 136 U/L (ref 98–192)

## 2022-03-22 LAB — CBC WITH DIFFERENTIAL/PLATELET
Abs Immature Granulocytes: 0.2 10*3/uL — ABNORMAL HIGH (ref 0.00–0.07)
Band Neutrophils: 3 %
Basophils Absolute: 0 10*3/uL (ref 0.0–0.1)
Basophils Relative: 0 %
Eosinophils Absolute: 0 10*3/uL (ref 0.0–0.5)
Eosinophils Relative: 0 %
HCT: 27.5 % — ABNORMAL LOW (ref 36.0–46.0)
Hemoglobin: 8.9 g/dL — ABNORMAL LOW (ref 12.0–15.0)
Lymphocytes Relative: 10 %
Lymphs Abs: 1.1 10*3/uL (ref 0.7–4.0)
MCH: 30.7 pg (ref 26.0–34.0)
MCHC: 32.4 g/dL (ref 30.0–36.0)
MCV: 94.8 fL (ref 80.0–100.0)
Metamyelocytes Relative: 2 %
Monocytes Absolute: 1 10*3/uL (ref 0.1–1.0)
Monocytes Relative: 9 %
Neutro Abs: 8.9 10*3/uL — ABNORMAL HIGH (ref 1.7–7.7)
Neutrophils Relative %: 76 %
Platelets: 382 10*3/uL (ref 150–400)
RBC: 2.9 MIL/uL — ABNORMAL LOW (ref 3.87–5.11)
RDW: 15.3 % (ref 11.5–15.5)
WBC: 11.3 10*3/uL — ABNORMAL HIGH (ref 4.0–10.5)
nRBC: 0 % (ref 0.0–0.2)

## 2022-03-22 LAB — MAGNESIUM: Magnesium: 2.8 mg/dL — ABNORMAL HIGH (ref 1.7–2.4)

## 2022-03-22 MED ORDER — HEPARIN SOD (PORK) LOCK FLUSH 100 UNIT/ML IV SOLN
500.0000 [IU] | Freq: Once | INTRAVENOUS | Status: AC
  Administered 2022-03-22: 500 [IU] via INTRAVENOUS

## 2022-03-22 MED ORDER — SODIUM CHLORIDE 0.9% FLUSH
10.0000 mL | INTRAVENOUS | Status: AC
  Administered 2022-03-22: 10 mL

## 2022-03-22 NOTE — Patient Instructions (Signed)
[facility]  Discharge Instructions: Thank you for choosing [facility] to provide your oncology and hematology care.  If you have a lab appointment with the [facility], please come in thru the Main Entrance and check in at the main information desk.  Wear comfortable clothing and clothing appropriate for easy access to any Portacath or PICC line.   We strive to give you quality time with your provider. You may need to reschedule your appointment if you arrive late (15 or more minutes).  Arriving late affects you and other patients whose appointments are after yours.  Also, if you miss three or more appointments without notifying the office, you may be dismissed from the clinic at the provider's discretion.      For prescription refill requests, have your pharmacy contact our office and allow 72 hours for refills to be completed.    Today you received the following chemotherapy and/or immunotherapy agents port flush and labs drawn.       To help prevent nausea and vomiting after your treatment, we encourage you to take your nausea medication as directed.  BELOW ARE SYMPTOMS THAT SHOULD BE REPORTED IMMEDIATELY: *FEVER GREATER THAN 100.4 F (38 C) OR HIGHER *CHILLS OR SWEATING *NAUSEA AND VOMITING THAT IS NOT CONTROLLED WITH YOUR NAUSEA MEDICATION *UNUSUAL SHORTNESS OF BREATH *UNUSUAL BRUISING OR BLEEDING *URINARY PROBLEMS (pain or burning when urinating, or frequent urination) *BOWEL PROBLEMS (unusual diarrhea, constipation, pain near the anus) TENDERNESS IN MOUTH AND THROAT WITH OR WITHOUT PRESENCE OF ULCERS (sore throat, sores in mouth, or a toothache) UNUSUAL RASH, SWELLING OR PAIN  UNUSUAL VAGINAL DISCHARGE OR ITCHING   Items with * indicate a potential emergency and should be followed up as soon as possible or go to the Emergency Department if any problems should occur.  Please show the CHEMOTHERAPY ALERT CARD or IMMUNOTHERAPY ALERT CARD at  check-in to the Emergency Department and triage nurse.  Should you have questions after your visit or need to cancel or reschedule your appointment, please contact [facility] [phone]  and follow the prompts.  Office hours are 8:00 a.m. to 4:30 p.m. Monday - Friday. Please note that voicemails left after 4:00 p.m. may not be returned until the following business day.  We are closed weekends and major holidays. You have access to a nurse at all times for urgent questions. Please call the main number to the clinic [phone] and follow the prompts.  For any non-urgent questions, you may also contact your provider using MyChart. We now offer e-Visits for anyone 47 and older to request care online for non-urgent symptoms. For details visit [email or web address].   Also download the MyChart app! Go to the app store, search "MyChart", open the app, select [facility], and log in with your MyChart username and password.

## 2022-03-22 NOTE — Progress Notes (Signed)
Kelli Hurley presented for Portacath access and flush.  Portacath located left chest wall accessed with  H 20 needle.  Good blood return present. Portacath flushed with 31m NS and 500U/57mHeparin and needle removed intact.  Procedure tolerated well and without incident. No complaints at this time. Discharged from clinic by wheel chair in stable condition. Alert and oriented x 3. [facility]

## 2022-03-23 LAB — KAPPA/LAMBDA LIGHT CHAINS
Kappa free light chain: 36.3 mg/L — ABNORMAL HIGH (ref 3.3–19.4)
Kappa, lambda light chain ratio: 1.45 (ref 0.26–1.65)
Lambda free light chains: 25.1 mg/L (ref 5.7–26.3)

## 2022-03-24 LAB — PROTEIN ELECTROPHORESIS, SERUM
A/G Ratio: 1.2 (ref 0.7–1.7)
Albumin ELP: 3.4 g/dL (ref 2.9–4.4)
Alpha-1-Globulin: 0.4 g/dL (ref 0.0–0.4)
Alpha-2-Globulin: 0.9 g/dL (ref 0.4–1.0)
Beta Globulin: 0.8 g/dL (ref 0.7–1.3)
Gamma Globulin: 0.8 g/dL (ref 0.4–1.8)
Globulin, Total: 2.8 g/dL (ref 2.2–3.9)
Total Protein ELP: 6.2 g/dL (ref 6.0–8.5)

## 2022-03-27 LAB — IMMUNOFIXATION ELECTROPHORESIS
IgA: 123 mg/dL (ref 87–352)
IgG (Immunoglobin G), Serum: 828 mg/dL (ref 586–1602)
IgM (Immunoglobulin M), Srm: 82 mg/dL (ref 26–217)
Total Protein ELP: 6.5 g/dL (ref 6.0–8.5)

## 2022-03-29 ENCOUNTER — Inpatient Hospital Stay: Payer: 59

## 2022-03-29 ENCOUNTER — Inpatient Hospital Stay (HOSPITAL_BASED_OUTPATIENT_CLINIC_OR_DEPARTMENT_OTHER): Payer: 59 | Admitting: Hematology

## 2022-03-29 VITALS — BP 122/74 | HR 78 | Temp 98.0°F | Resp 19 | Ht 62.0 in | Wt 293.3 lb

## 2022-03-29 DIAGNOSIS — Z1329 Encounter for screening for other suspected endocrine disorder: Secondary | ICD-10-CM

## 2022-03-29 DIAGNOSIS — C9000 Multiple myeloma not having achieved remission: Secondary | ICD-10-CM

## 2022-03-29 DIAGNOSIS — D508 Other iron deficiency anemias: Secondary | ICD-10-CM

## 2022-03-29 LAB — TSH: TSH: 1.113 u[IU]/mL (ref 0.350–4.500)

## 2022-03-29 NOTE — Progress Notes (Signed)
Port accessed with 20 G needle using sterile technique.  Excellent blood return noted.  Flushed with saline and 500 units of heparin flush. Site CDI upon removal and dressing applied.  Patient discharged via wheelchair assisted in stable condition.

## 2022-03-29 NOTE — Patient Instructions (Signed)
[place] at [facility] Discharge Instructions   You were seen and examined today by Dr. Delton Coombes.  He reviewed the results of your lab work. Your kidney number has gone up. Follow up with Dr. Theador Hawthorne on Friday as planned. All your other numbers are normal/stable. Your myeloma labs are normal.  Continue Pomalyst as prescribed.   Return as scheduled.    Thank you for choosing [place] at [facility] to provide your oncology and hematology care.  To afford each patient quality time with our provider, please arrive at least 15 minutes before your scheduled appointment time.   If you have a lab appointment with the [facility] please come in thru the Main Entrance and check in at the main information desk.  You need to re-schedule your appointment should you arrive 10 or more minutes late.  We strive to give you quality time with our providers, and arriving late affects you and other patients whose appointments are after yours.  Also, if you no show three or more times for appointments you may be dismissed from the clinic at the providers discretion.     Again, thank you for choosing [facility].  Our hope is that these requests will decrease the amount of time that you wait before being seen by our physicians.       _____________________________________________________________  Should you have questions after your visit to [facility], please contact our office at [phone] and follow the prompts.  Our office hours are 8:00 a.m. and 4:30 p.m. Monday - Friday.  Please note that voicemails left after 4:00 p.m. may not be returned until the following business day.  We are closed weekends and major holidays.  You do have access to a nurse 24-7, just call the main number to the clinic [phone] and do not press any options, hold on the line and a nurse will answer the phone.    For prescription refill requests,  have your pharmacy contact our office and allow 72 hours.    Due to Covid, you will need to wear a mask upon entering the hospital. If you do not have a mask, a mask will be given to you at the Main Entrance upon arrival. For doctor visits, patients may have 1 support person age 46 or older with them. For treatment visits, patients can not have anyone with them due to social distancing guidelines and our immunocompromised population.

## 2022-03-29 NOTE — Progress Notes (Signed)
Patient is taking Pomalyst as prescribed.  She has not missed any doses and reports no side effects at this time.

## 2022-03-29 NOTE — Progress Notes (Signed)
[place] [address],  [id number]    Clinic Day:  03/29/2022  Referring physician: [facility]  Patient Care Team: [facility] as PCP - General Gasper Sells, Terisa Starr, MD as PCP - Cardiology (Cardiology) Derek Jack, MD as Consulting Physician (Medical Oncology) Tanda Rockers, MD as Consulting Physician (Pulmonary Disease)   ASSESSMENT & PLAN:   Assessment: 1.  Relapsed IgG kappa multiple myeloma with high risk features: -5 cycles of carfilzomib, pomalidomide and dexamethasone from 03/05/2019 through 06/25/2019. -Myeloma panel on 07/09/2019 shows M spike 0.3 g, slightly up from 0.2 g previously.  However free light chain ratio has 1.37 and improved.  Kappa light chains are 24.8. - Carfilzomib discontinued after 08/06/2019 secondary to fluid retention. - She is currently on Pomalyst 4 mg 3 weeks on 1 week off. - PET scan on 03/22/2020 with no evidence of malignancy. - Bone marrow biopsy on 03/19/2020 showed approximately 5% plasma cells.  Chromosome analysis and FISH panel were normal.   2.  Shortness of breath on exertion: -2D echo on 02/09/2019 shows EF 60-65%.  No LVH. -Chest CT PE protocol on 02/24/2019 was negative. -Cardiac MRI on 03/07/2019 shows LVEF 56% with no amyloid.  Troponin T and proBNP were negative. -PFTs show low expiratory reserve volume consistent with body habitus.  Moderate diffusion defect which corrects to normal.  FVC, FEV1, FEV1/FVC ratio are within normal limits.  FVC is reduced relative to SVC indicates air-trapping.  No significant response after bronchodilators.  Reduced diffusion capacity indicates moderate loss of functional alveolar capillary space. -She was evaluated by Dr. Melvyn Novas. -2D echocardiogram on 08/27/2019 shows LVEF 60 to 65%.  No LVH.   3.  Myeloma bone disease: -Bone density on 07/04/2019 shows T score 1.8.  Plan: 1.  Relapsed IgG kappa multiple myeloma with high  risk features: - Reviewed myeloma panel from 03/22/2022.  M spike is negative.  Free light chain ratio is normal at 1.45.  Kappa light chains 36.3 and improved.  Immunofixation shows IgG kappa. - She has lost 65 pounds since she was started on Ozempic 6 months ago.  Losartan and farxiga were discontinued by Dr. Theador Hawthorne due to elevated creatinine. - She complains of feeling very cold.  We checked a TSH level today which was normal at 1.1. - Continue Pomalyst 4 mg 3 weeks on/1 week off along with dexamethasone weekly.  RTC 3 months for follow-up.   2.  Normocytic anemia: - Received Feraheme on 12/26/2021 and 01/02/2022. - Ferritin on 02/15/2022 was 1004 and saturation was 50. - Normocytic anemia from CKD and relative iron deficiency.  Latest hemoglobin is 8.9.   3.  Thromboprophylaxis: - Continue aspirin 81 mg daily.   4.  Neuropathy: - In the hands and feet stable.  Continue gabapentin which was dose reduced recently.   5.  Lower extremity edema: - She is taking Lasix 40 mg twice daily and metolazone 2.5 mg 3 times weekly.  Creatinine is 2.92.  She will follow-up with Dr. Theador Hawthorne on Friday.   6.  Myeloma bone disease: - Zometa held after last infusion on 01/27/2020 due to worsening renal function.   7.  Hypokalemia: - Continue potassium 2 tablets daily at home.   8.  Bilateral knee pains: - Continue Percocet 5/325 twice daily as needed.  Orders Placed This Encounter  Procedures   CBC with Differential    Standing Status:   Future    Standing Expiration  Date:   03/29/2023   Comprehensive metabolic panel    Standing Status:   Future    Standing Expiration Date:   03/29/2023   TSH    Standing Status:   Future    Standing Expiration Date:   03/29/2023   Kappa/lambda light chains    Standing Status:   Future    Standing Expiration Date:   03/29/2023   Protein electrophoresis, serum    Standing Status:   Future    Standing Expiration Date:   03/29/2023   Immunofixation electrophoresis     Standing Status:   Future    Standing Expiration Date:   03/29/2023   Iron and TIBC ([facility] ONLY)    Standing Status:   Future    Standing Expiration Date:   03/30/2023   Ferritin    Standing Status:   Future    Standing Expiration Date:   03/29/2023   TSH      Beverly Gust Oliver,acting as a scribe for Derek Jack, MD.,have documented all relevant documentation on the behalf of Derek Jack, MD,as directed by  Derek Jack, MD while in the presence of Derek Jack, MD.   I, Derek Jack MD, have reviewed the above documentation for accuracy and completeness, and I agree with the above.   Derek Jack, MD   2/21/20245:51 PM  CHIEF COMPLAINT:   Diagnosis: multiple myeloma    Cancer Staging  Multiple myeloma [facility]) Staging form: Multiple Myeloma, AJCC 6th Edition - Clinical stage from 10/26/2014: Stage IIA - Signed by Baird Cancer, PA-C on 10/26/2014 - Pathologic: No stage assigned - Unsigned    Prior Therapy: 1. Bortezomib x 8 cycles from 10/05/2014 to 04/06/2016. 2. Carfilzomib x 6 cycles from 03/05/2019 to 08/06/2019.  Current Therapy:  Pomalyst 4 mg 3/4 weeks    HISTORY OF PRESENT ILLNESS:   Oncology History  Multiple myeloma ([place])  08/07/2014 Imaging   Bone Survey- No lytic lesions are noted in the visualized skeleton.   09/03/2014 Bone Marrow Biopsy   NORMOCELLULAR BONE MARROW FOR AGE WITH PLASMA CELL NEOPLASM.  The plasma cell component is increased in the marrow representing an estimated 18% of all cells. Cytogenetics with 13q-, 17p- (high risk disease)   09/03/2014 Pathology Results   Cytogenetics with 13q-, 17p- (high risk disease)   09/23/2014 Initial Diagnosis   Multiple myeloma   09/30/2014 PET scan   No abnormal hypermetabolism in the neck, chest, abdomen or pelvis.   10/05/2014 - 03/22/2015 Chemotherapy   RVD   10/28/2014 Treatment Plan Change   Issues related to getting Revlimid in a  timely fashion, therefore, she received her Revlimid on 9/21 resulting in a 12 day cycle this time instead of a 14 day cycle   11/02/2014 Imaging   CTA chest- No evidence for a large or central pulmonary embolism as described.  8 mm density along the right minor fissure could represent focal pleural thickening but indeterminate. If the patient is at high risk for bronchogenic carcinoma, follow-up c   11/23/2014 Miscellaneous   Zometa 4 mg IV monthly   04/07/2015 Bone Marrow Biopsy   Normocellular marrow with 2-4% clonal plasma cells by immunohistochemistry. FISH and cytogenetics were normal [facility])     04/2015 Miscellaneous   PRETRANSPLANT EVALUATION:  Pulmonary function tests: FEV1 100.3% / DLCO 97.9%  Echocardiogram: Normal LV function with EF 60-65%    04/27/2015 Procedure   Stem cell mobilization with filgrastim and Mozobil [facility])   05/06/2015 Miscellaneous   BMT conditioning  regimen with high-dose Melphalan given [facility] [name]); Day -1   05/07/2015 Bone Marrow Transplant   Outpatient autologous stem cell transplant [facility] [name]); Day 0   05/18/2015 Miscellaneous   WBC engraftment;  did not require platelet transfusion during her transplant process. Lowest platelet count 28,000    05/20/2015 Procedure   Tunneled catheter removed [facility])   09/08/2015 -  Chemotherapy   Velcade every 2 weeks   12/15/2015 Miscellaneous   Zometa re-instituted.    12/23/2015 Imaging   Bone density- BMD as determined from Forearm Radius 33% is 0.799 g/cm2 with a T-Score of 1.2. This patient is considered normal according to [facility]) criteria.   04/17/2016 Miscellaneous   Started maintenance with Ninlaro.   05/04/2016 Treatment Plan Change   Zometa switched to Xgeva injection monthly given difficult IV access.    03/05/2019 - 08/06/2019 Chemotherapy   The patient had carfilzomib (KYPROLIS) 40 mg in dextrose 5 % 50 mL chemo infusion, 18 mg/m2 = 44 mg,  Intravenous,  Once, 6 of 6 cycles Dose modification: 56 mg/m2 (original dose 56 mg/m2, Cycle 1, Reason: Provider Judgment) Administration: 40 mg (03/05/2019), 120 mg (03/12/2019), 120 mg (03/20/2019), 120 mg (04/02/2019), 120 mg (04/09/2019), 120 mg (04/16/2019), 120 mg (04/30/2019), 120 mg (05/08/2019), 120 mg (05/14/2019), 120 mg (05/28/2019), 120 mg (06/04/2019), 120 mg (06/11/2019), 120 mg (06/25/2019), 120 mg (07/02/2019), 120 mg (07/09/2019), 120 mg (07/23/2019), 120 mg (07/30/2019), 120 mg (08/06/2019)  for chemotherapy treatment.       INTERVAL HISTORY:   Kelli Hurley is a 67 y.o. female presenting to clinic today for follow up of multiple myeloma . She was last seen by me on 12/20/2021.  Today, she states that she is doing well overall. Her appetite level is at 90%. Her energy level is at 75%. She has 6/10 knee pain. She denies any recent changes. Her Gabapentin medication was lowered. She denies having the shingles. She still takes steroids medication and denies any side effects. She is currently on ozempic. She has lost 65lbs over the course of 5-6 months. She has been cold recently and this is a new feeling.   PAST MEDICAL HISTORY:   Past Medical History: Past Medical History:  Diagnosis Date   Anemia    Arthritis    Cancer ([place])    Claustrophobia 10/05/2014   Hypertension    Leukopenia 08/03/2014   Normocytic hypochromic anemia 08/03/2014   Renal disorder    stage 3     Surgical History: Past Surgical History:  Procedure Laterality Date   ABDOMINAL HYSTERECTOMY     CARDIAC SURGERY     67 months old. States she had a leaky valve.    COLONOSCOPY  03/26/2009   Dr. Oneida Alar; normal colon, small internal hemorrhoids.  Recommended repeat colonoscopy in 10 years.   COLONOSCOPY WITH PROPOFOL N/A 11/25/2019   Procedure: COLONOSCOPY WITH PROPOFOL;  Surgeon: Eloise Harman, DO;  Location: [facility];  Service: Endoscopy;  Laterality: N/A;  10:45am   OTHER SURGICAL HISTORY     heart surgery as infant to  "repair hole in heart"   PORTACATH PLACEMENT Left 02/21/2019   Procedure: INSERTION PORT-A-CATH;  Surgeon: Virl Cagey, MD;  Location: [facility] ORS;  Service: General;  Laterality: Left;    Social History: Social History   Socioeconomic History   Marital status: Legally Separated    Spouse name: Not on file   Number of children: Not on file   Years of education: Not on file  Highest education level: Not on file  Occupational History   Not on file  Tobacco Use   Smoking status: Never   Smokeless tobacco: Never  Vaping Use   Vaping Use: Never used  Substance and Sexual Activity   Alcohol use: No   Drug use: No   Sexual activity: Not on file    Comment: divorced- 2 daughters  Other Topics Concern   Not on file  Social History Narrative   Not on file   Social Determinants of Health   Financial Resource Strain: Low Risk  (05/31/2020)   Overall Financial Resource Strain (CARDIA)    Difficulty of Paying Living Expenses: Not hard at all  Food Insecurity: No Food Insecurity (05/31/2020)   Hunger Vital Sign    Worried About Running Out of Food in the Last Year: Never true    Ran Out of Food in the Last Year: Never true  Transportation Needs: No Transportation Needs (05/31/2020)   PRAPARE - Hydrologist (Medical): No    Lack of Transportation (Non-Medical): No  Physical Activity: Inactive (05/31/2020)   Exercise Vital Sign    Days of Exercise per Week: 0 days    Minutes of Exercise per Session: 0 min  Stress: No Stress Concern Present (05/31/2020)   [place]    Feeling of Stress : Not at all  Social Connections: Unknown (05/31/2020)   Social Connection and Isolation Panel [NHANES]    Frequency of Communication with Friends and Family: More than three times a week    Frequency of Social Gatherings with Friends and Family: Once a week    Attends Religious Services: More than 4 times  per year    Active Member of [employer] or Organizations: No    Attends Archivist Meetings: Never    Marital Status: Not on file  Intimate Partner Violence: Not At Risk (05/31/2020)   Humiliation, Afraid, Rape, and Kick questionnaire    Fear of Current or Ex-Partner: No    Emotionally Abused: No    Physically Abused: No    Sexually Abused: No    Family History: Family History  Problem Relation Age of Onset   Cancer Mother    Hypertension Mother    Cancer Father    Hypertension Father    Cancer Maternal Grandmother    Hypertension Sister    Hypertension Brother    Prostate cancer Brother    Colon cancer Neg Hx    Colon polyps Neg Hx     Current Medications:  Current Outpatient Medications:    acetaminophen (TYLENOL) 650 MG CR tablet, Take 650 mg by mouth every 8 (eight) hours as needed for pain., Disp: , Rfl:    acyclovir (ZOVIRAX) 400 MG tablet, TAKE ONE (1) TABLET BY MOUTH TWICE DAILY, Disp: 60 tablet, Rfl: 10   albuterol (VENTOLIN HFA) 108 (90 Base) MCG/ACT inhaler, Inhale 2 puffs into the lungs every 6 (six) hours as needed for wheezing or shortness of breath., Disp: 8 g, Rfl: 3   albuterol (VENTOLIN HFA) 108 (90 Base) MCG/ACT inhaler, 2 puffs as needed, Disp: , Rfl:    ALPRAZolam (XANAX) 1 MG tablet, Take 1 mg by mouth daily as needed., Disp: , Rfl:    aspirin EC 81 MG tablet, Take 81 mg by mouth daily., Disp: , Rfl:    calcitRIOL (ROCALTROL) 0.25 MCG capsule, Take by mouth., Disp: , Rfl:    cholecalciferol (VITAMIN D) 1000  units tablet, Take 1,000 Units by mouth daily., Disp: , Rfl:    dapagliflozin propanediol (FARXIGA) 10 MG TABS tablet, Take 1 tablet (10 mg total) by mouth daily before breakfast., Disp: 30 tablet, Rfl: 8   dexamethasone (DECADRON) 4 MG tablet, Take 5 tablets once a week, Disp: 30 tablet, Rfl: 6   Elastic Bandages & Supports (MEDICAL COMPRESSION STOCKINGS) MISC, See admin instructions., Disp: , Rfl:    fluticasone (FLONASE) 50 MCG/ACT nasal spray,  1 spray in each nostril Nasally Once a day for 30 day(s), Disp: , Rfl:    furosemide (LASIX) 20 MG tablet, TAKE 2 TABLETS BY MOUTH DAILY IN THE MORNING AND TAKE 2 TABLETS AT BEDTIME, Disp: 120 tablet, Rfl: 8   gabapentin (NEURONTIN) 300 MG capsule, TAKE 1 CAPSULE BY MOUTH THREE TIMES DAILY, Disp: 90 capsule, Rfl: 10   Infant Care Products (DERMACLOUD) OINT, Apply to affected area as needed Externally for 30 days, Disp: , Rfl:    lidocaine-prilocaine (EMLA) cream, APPLY A QUATER SIZED AMOUNT 1 HOUR PRIOR TO TREATMENT, Disp: 30 g, Rfl: 10   losartan (COZAAR) 25 MG tablet, Take 12.5 mg by mouth daily., Disp: , Rfl:    magnesium oxide (MAG-OX) 400 (240 Mg) MG tablet, TAKE 1 TABLET BY MOUTH TWICE DAILY, Disp: 60 tablet, Rfl: 6   metolazone (ZAROXOLYN) 2.5 MG tablet, Take 1 tablet (2.5 mg total) by mouth daily., Disp: 30 tablet, Rfl: 6   Multiple Vitamin (TAB-A-VITE) TABS, TAKE 1 TABLET BY MOUTH ONCE DAILY. (Patient taking differently: Take 1 tablet by mouth daily.), Disp: 30 tablet, Rfl: 11   oxyCODONE-acetaminophen (PERCOCET/ROXICET) 5-325 MG tablet, TAKE 1 OR 2 TABLETS BY MOUTH EVERY TWELVE HOURS AS NEEDED for severe pain, Disp: 60 tablet, Rfl: 0   pomalidomide (POMALYST) 4 MG capsule, Take 1 capsule (4 mg total) by mouth daily. 21 days on, 7 days off, Disp: 21 capsule, Rfl: 0   potassium chloride SA (KLOR-CON M) 20 MEQ tablet, TAKE 1 TABLET BY MOUTH THREE TIMES DAILY, Disp: 90 tablet, Rfl: 10   rosuvastatin (CRESTOR) 10 MG tablet, Take 1 tablet (10 mg total) by mouth daily., Disp: 90 tablet, Rfl: 3   Semaglutide, 1 MG/DOSE, 4 MG/3ML SOPN, Inject 1 mg into the skin once a week., Disp: 3 mL, Rfl: 11   spironolactone (ALDACTONE) 25 MG tablet, Take 12.5 mg by mouth daily., Disp: , Rfl:    tiZANidine (ZANAFLEX) 4 MG capsule, Take 1 capsule (4 mg total) by mouth 3 (three) times daily as needed for muscle spasms. Do not drink alcohol or drive while taking this medication.  May cause drowsiness., Disp: 15  capsule, Rfl: 0   trolamine salicylate (ASPERCREME) 10 % cream, Apply 1 application topically 2 (two) times daily as needed for muscle pain., Disp: , Rfl:  No current facility-administered medications for this visit.  Facility-Administered Medications Ordered in Other Visits:    heparin lock flush 100 unit/mL, 500 Units, Intravenous, Once, [place], Larene Beach K, MD   sodium chloride 0.9 % injection 10 mL, 10 mL, Intravenous, Once, [place], Kelby Fam, MD   Allergies: Allergies  Allergen Reactions   Diclofenac Swelling    Per pt, facial swelling    REVIEW OF SYSTEMS:   Review of Systems  Constitutional:  Negative for chills, fatigue and fever.  HENT:   Negative for lump/mass, mouth sores, nosebleeds, sore throat and trouble swallowing.   Eyes:  Negative for eye problems.  Respiratory:  Positive for shortness of breath. Negative for cough.   Cardiovascular:  Negative for chest pain, leg swelling and palpitations.  Gastrointestinal:  Negative for abdominal pain, constipation, diarrhea, nausea and vomiting.  Genitourinary:  Negative for bladder incontinence, difficulty urinating, dysuria, frequency, hematuria and nocturia.   Musculoskeletal:  Negative for arthralgias, back pain, flank pain, myalgias and neck pain.  Skin:  Negative for itching and rash.  Neurological:  Negative for dizziness, headaches and numbness.  Hematological:  Does not bruise/bleed easily.  Psychiatric/Behavioral:  Negative for depression, sleep disturbance and suicidal ideas. The patient is not nervous/anxious.   All other systems reviewed and are negative.    VITALS:   Blood pressure 122/74, pulse 78, temperature 98 F (36.7 C), temperature source Oral, resp. rate 19, height 5' 2"$  (1.575 m), weight 293 lb 4.8 oz (133 kg), SpO2 97 %.  Wt Readings from Last 3 Encounters:  03/29/22 293 lb 4.8 oz (133 kg)  12/20/21 (!) 319 lb 3.2 oz (144.8 kg)  11/24/21 (!) 325 lb (147.4 kg)    Body mass index is 53.65  kg/m.  Performance status (ECOG): 1 - Symptomatic but completely ambulatory  PHYSICAL EXAM:   Physical Exam Vitals and nursing note reviewed. Exam conducted with a chaperone present.  Constitutional:      Appearance: Normal appearance.  Cardiovascular:     Rate and Rhythm: Normal rate and regular rhythm.     Pulses: Normal pulses.     Heart sounds: Normal heart sounds.  Pulmonary:     Effort: Pulmonary effort is normal.     Breath sounds: Normal breath sounds.  Abdominal:     Palpations: Abdomen is soft. There is no hepatomegaly, splenomegaly or mass.     Tenderness: There is no abdominal tenderness.  Musculoskeletal:     Right lower leg: No edema.     Left lower leg: No edema.  Lymphadenopathy:     Cervical: No cervical adenopathy.     Right cervical: No superficial, deep or posterior cervical adenopathy.    Left cervical: No superficial, deep or posterior cervical adenopathy.     Upper Body:     Right upper body: No supraclavicular or axillary adenopathy.     Left upper body: No supraclavicular or axillary adenopathy.  Neurological:     General: No focal deficit present.     Mental Status: She is alert and oriented to person, place, and time.  Psychiatric:        Mood and Affect: Mood normal.        Behavior: Behavior normal.     LABS:      Latest Ref Rng & Units 03/22/2022   10:34 AM 02/15/2022   10:34 AM 12/13/2021   11:25 AM  CBC  WBC 4.0 - 10.5 K/uL 11.3  7.6  4.0   Hemoglobin 12.0 - 15.0 g/dL 8.9  8.6  8.7   Hematocrit 36.0 - 46.0 % 27.5  27.1  26.8   Platelets 150 - 400 K/uL 382  355  274       Latest Ref Rng & Units 03/22/2022   10:34 AM 02/15/2022   10:34 AM 12/13/2021   11:25 AM  CMP  Glucose 70 - 99 mg/dL 151  140  129   BUN 8 - 23 mg/dL 54  50  41   Creatinine 0.44 - 1.00 mg/dL 2.92  2.69  1.84   Sodium 135 - 145 mmol/L 130  134  135   Potassium 3.5 - 5.1 mmol/L 3.2  4.0  3.9   Chloride 98 - 111 mmol/L  88  93  99   CO2 22 - 32 mmol/L 28  26  27    $ Calcium 8.9 - 10.3 mg/dL 9.4  9.2  9.2   Total Protein 6.5 - 8.1 g/dL 7.1  7.0  6.9   Total Bilirubin 0.3 - 1.2 mg/dL 0.4  0.4  0.4   Alkaline Phos 38 - 126 U/L 67  64  49   AST 15 - 41 U/L 16  14  16   $ ALT 0 - 44 U/L 13  18  13      $ No results found for: "CEA1", "CEA" / No results found for: "CEA1", "CEA" No results found for: "PSA1" No results found for: "[id number]" No results found for: "CAN125"  Lab Results  Component Value Date   TOTALPROTELP 6.5 03/22/2022   TOTALPROTELP 6.2 03/22/2022   ALBUMINELP 3.4 03/22/2022   A1GS 0.4 03/22/2022   A2GS 0.9 03/22/2022   BETS 0.8 03/22/2022   GAMS 0.8 03/22/2022   MSPIKE Not Observed 03/22/2022   SPEI Comment 03/22/2022   Lab Results  Component Value Date   TIBC 204 (L) 02/15/2022   TIBC 276 12/20/2021   TIBC 306 06/15/2021   FERRITIN 1,004 (H) 02/15/2022   FERRITIN 186 12/20/2021   FERRITIN 103 06/15/2021   IRONPCTSAT 50 (H) 02/15/2022   IRONPCTSAT 18 12/20/2021   IRONPCTSAT 21 06/15/2021   Lab Results  Component Value Date   LDH 136 03/22/2022   LDH 134 12/13/2021   LDH 168 09/14/2021     STUDIES:   No results found.

## 2022-04-03 ENCOUNTER — Other Ambulatory Visit: Payer: Self-pay

## 2022-04-03 MED ORDER — POMALIDOMIDE 4 MG PO CAPS: 4.0000 mg | ORAL_CAPSULE | Freq: Every day | ORAL | 0 refills | Status: DC

## 2022-04-03 NOTE — Telephone Encounter (Signed)
Chart reviewed. Pomalyst refilled per last office note with Dr. Delton Coombes.

## 2022-04-05 ENCOUNTER — Other Ambulatory Visit: Payer: Self-pay

## 2022-04-05 DIAGNOSIS — G8929 Other chronic pain: Secondary | ICD-10-CM

## 2022-04-06 ENCOUNTER — Encounter: Payer: Self-pay | Admitting: Radiology

## 2022-04-06 MED ORDER — OXYCODONE-ACETAMINOPHEN 5-325 MG PO TABS: ORAL_TABLET | ORAL | 0 refills | Status: DC

## 2022-04-10 ENCOUNTER — Other Ambulatory Visit: Payer: Self-pay | Admitting: Hematology

## 2022-04-14 ENCOUNTER — Ambulatory Visit: Payer: 59 | Admitting: Pulmonary Disease

## 2022-04-26 ENCOUNTER — Other Ambulatory Visit: Payer: Self-pay | Admitting: *Deleted

## 2022-04-26 ENCOUNTER — Encounter: Payer: Self-pay | Admitting: Internal Medicine

## 2022-04-26 ENCOUNTER — Ambulatory Visit: Payer: 59 | Attending: Internal Medicine | Admitting: Internal Medicine

## 2022-04-26 VITALS — BP 142/78 | HR 78 | Ht 62.0 in | Wt 289.0 lb

## 2022-04-26 DIAGNOSIS — C9000 Multiple myeloma not having achieved remission: Secondary | ICD-10-CM

## 2022-04-26 DIAGNOSIS — G4733 Obstructive sleep apnea (adult) (pediatric): Secondary | ICD-10-CM | POA: Diagnosis not present

## 2022-04-26 MED ORDER — POTASSIUM CHLORIDE CRYS ER 20 MEQ PO TBCR: EXTENDED_RELEASE_TABLET | ORAL | 10 refills | Status: DC

## 2022-04-26 MED ORDER — GABAPENTIN 300 MG PO CAPS: 300.0000 mg | ORAL_CAPSULE | Freq: Three times a day (TID) | ORAL | 10 refills | Status: DC

## 2022-04-26 NOTE — Progress Notes (Signed)
Cardiology Office Note  Date: 04/26/2022   ID: Kelli Hurley, DOB 09-25-1955, MRN [id number]  PCP:  [facility]  Cardiologist:  Werner Lean, MD Electrophysiologist:  None   Reason for Office Visit: Follow-up of possible HFpEF   History of Present Illness: Kelli Hurley is a 67 y.o. female known to have HTN, OSA on CPAP, multiple biloma in remission with no evidence of cardiac amyloidosis per cardiac MRI in 2021, CKD stage IIIb presented to cardiology clinic for follow-up visit.  Patient was initially diagnosed with multiple myeloma for which she underwent cardiac MRI in 2021 that showed no evidence of cardiac amyloidosis, normal LV size and function EF 56%, no hyperenhancement of LV myocardium on delayed immunization recovery sequences, normal RV size and function. Echocardiogram from 2022 showed normal LVEF, indeterminate LV diastology and no valve abnormalities. She presents today for follow-up visit, accompanied by niece.  She continues to feel short of breath, baseline, no recent worsening, her diuretic regimen is managed by her nephrologist due to CKD stage IIIb. Started on Ozempic almost several months ago, notes significant weight loss with some improvement in SOB.  She also has knee arthritis issues limiting her ambulation.  Past Medical History:  Diagnosis Date   Anemia    Arthritis    Cancer ([place])    Claustrophobia 10/05/2014   Hypertension    Leukopenia 08/03/2014   Normocytic hypochromic anemia 08/03/2014   Renal disorder    stage 3     Past Surgical History:  Procedure Laterality Date   ABDOMINAL HYSTERECTOMY     CARDIAC SURGERY     3 months old. States she had a leaky valve.    COLONOSCOPY  03/26/2009   Dr. Oneida Alar; normal colon, small internal hemorrhoids.  Recommended repeat colonoscopy in 10 years.   COLONOSCOPY WITH PROPOFOL N/A 11/25/2019   Procedure: COLONOSCOPY WITH PROPOFOL;  Surgeon: Eloise Harman, DO;   Location: [facility];  Service: Endoscopy;  Laterality: N/A;  10:45am   OTHER SURGICAL HISTORY     heart surgery as infant to "repair hole in heart"   PORTACATH PLACEMENT Left 02/21/2019   Procedure: INSERTION PORT-A-CATH;  Surgeon: Virl Cagey, MD;  Location: [facility];  Service: General;  Laterality: Left;    Current Outpatient Medications  Medication Sig Dispense Refill   acetaminophen (TYLENOL) 650 MG CR tablet Take 650 mg by mouth every 8 (eight) hours as needed for pain.     albuterol (VENTOLIN HFA) 108 (90 Base) MCG/ACT inhaler Inhale 2 puffs into the lungs every 6 (six) hours as needed for wheezing or shortness of breath. 8 g 3   albuterol (VENTOLIN HFA) 108 (90 Base) MCG/ACT inhaler 2 puffs as needed     aspirin EC 81 MG tablet Take 81 mg by mouth daily.     calcitRIOL (ROCALTROL) 0.25 MCG capsule Take by mouth.     cholecalciferol (VITAMIN D) 1000 units tablet Take 1,000 Units by mouth daily.     dexamethasone (DECADRON) 4 MG tablet Take 5 tablets once a week 30 tablet 6   Elastic Bandages & Supports (MEDICAL COMPRESSION STOCKINGS) MISC See admin instructions.     furosemide (LASIX) 20 MG tablet TAKE 2 TABLETS BY MOUTH DAILY IN THE MORNING AND TAKE 2 TABLETS AT BEDTIME (Patient taking differently: Take 20 mg by mouth as directed. 2 tablet in the morning and 2 tablets at bedtime on Mon., Wed., and Friday. 1 tablet in the morning and 1  tablet at bedtime on Sun., Tues., Thurs., and Sat.) 120 tablet 8   gabapentin (NEURONTIN) 300 MG capsule Take 1 capsule (300 mg total) by mouth 3 (three) times daily. (Patient taking differently: Take 300 mg by mouth 2 (two) times daily.) 90 capsule 10   Infant Care Products (DERMACLOUD) OINT Apply to affected area as needed Externally for 30 days     lidocaine-prilocaine (EMLA) cream APPLY 1 QUARTER SIZED AMOUNT 1 HOUR PRIOR TO TREATMENT 30 g 10   magnesium oxide (MAG-OX) 400 (240 Mg) MG tablet TAKE 1 TABLET BY MOUTH TWICE DAILY 60 tablet 6    metolazone (ZAROXOLYN) 2.5 MG tablet Take 1 tablet (2.5 mg total) by mouth daily. (Patient taking differently: Take 2.5 mg by mouth 3 (three) times a week. Mon., Wed., and Friday) 30 tablet 6   Multiple Vitamin (TAB-A-VITE) TABS TAKE 1 TABLET BY MOUTH ONCE DAILY. (Patient taking differently: Take 1 tablet by mouth daily.) 30 tablet 11   oxyCODONE-acetaminophen (PERCOCET/ROXICET) 5-325 MG tablet TAKE 1 OR 2 TABLETS BY MOUTH EVERY TWELVE HOURS AS NEEDED for severe pain 60 tablet 0   pomalidomide (POMALYST) 4 MG capsule Take 1 capsule (4 mg total) by mouth daily. 21 days on, 7 days off 21 capsule 0   potassium chloride SA (KLOR-CON M) 20 MEQ tablet TAKE 1 TABLET BY MOUTH THREE TIMES DAILY (Patient taking differently: Take 20 mEq by mouth 2 (two) times daily. TAKE 1 TABLET BY MOUTH THREE TIMES DAILY) 90 tablet 10   rosuvastatin (CRESTOR) 10 MG tablet Take 1 tablet (10 mg total) by mouth daily. (Patient taking differently: Take 10 mg by mouth in the morning and at bedtime.) 90 tablet 3   Semaglutide, 1 MG/DOSE, 4 MG/3ML SOPN Inject 1 mg into the skin once a week. 3 mL 11   tiZANidine (ZANAFLEX) 4 MG capsule Take 1 capsule (4 mg total) by mouth 3 (three) times daily as needed for muscle spasms. Do not drink alcohol or drive while taking this medication.  May cause drowsiness. 15 capsule 0   trolamine salicylate (ASPERCREME) 10 % cream Apply 1 application topically 2 (two) times daily as needed for muscle pain.     acyclovir (ZOVIRAX) 400 MG tablet TAKE ONE (1) TABLET BY MOUTH TWICE DAILY (Patient not taking: Reported on 04/26/2022) 60 tablet 10   ALPRAZolam (XANAX) 1 MG tablet Take 1 mg by mouth daily as needed. (Patient not taking: Reported on 04/26/2022)     dapagliflozin propanediol (FARXIGA) 10 MG TABS tablet Take 1 tablet (10 mg total) by mouth daily before breakfast. (Patient not taking: Reported on 04/26/2022) 30 tablet 8   fluticasone (FLONASE) 50 MCG/ACT nasal spray 1 spray in each nostril Nasally Once a  day for 30 day(s) (Patient not taking: Reported on 04/26/2022)     losartan (COZAAR) 25 MG tablet Take 12.5 mg by mouth daily. (Patient not taking: Reported on 04/26/2022)     spironolactone (ALDACTONE) 25 MG tablet Take 12.5 mg by mouth daily. (Patient not taking: Reported on 04/26/2022)     No current facility-administered medications for this visit.   Facility-Administered Medications Ordered in Other Visits  Medication Dose Route Frequency Provider Last Rate Last Admin   heparin lock flush 100 unit/mL  500 Units Intravenous Once Penland, Kelby Fam, MD       sodium chloride 0.9 % injection 10 mL  10 mL Intravenous Once Penland, Kelby Fam, MD       Allergies:  Diclofenac   Social History: The patient  reports that she has never smoked. She has never used smokeless tobacco. She reports that she does not drink alcohol and does not use drugs.   Family History: The patient's family history includes Cancer in her father, maternal grandmother, and mother; Hypertension in her brother, father, mother, and sister; Prostate cancer in her brother.   ROS:  Please see the history of present illness. Otherwise, complete review of systems is positive for none.  All other systems are reviewed and negative.   Physical Exam: VS:  BP (!) 142/78   Pulse 78   Ht 5\' 2"  (1.575 m)   Wt 289 lb (131.1 kg)   SpO2 95%   BMI 52.86 kg/m , BMI Body mass index is 52.86 kg/m.  Wt Readings from Last 3 Encounters:  04/26/22 289 lb (131.1 kg)  03/29/22 293 lb 4.8 oz (133 kg)  12/20/21 (!) 319 lb 3.2 oz (144.8 kg)    General: Patient appears comfortable at rest. HEENT: Conjunctiva and lids normal, oropharynx clear with moist mucosa. Neck: Supple, no elevated JVP or carotid bruits, no thyromegaly. Lungs: Clear to auscultation, nonlabored breathing at rest. Cardiac: Regular rate and rhythm, no S3 or significant systolic murmur, no pericardial rub. Abdomen: Soft, nontender, no hepatomegaly, bowel sounds present, no  guarding or rebound. Extremities: No pitting edema, distal pulses 2+. Skin: Warm and dry. Musculoskeletal: No kyphosis. Neuropsychiatric: Alert and oriented x3, affect grossly appropriate.  ECG:  NSR  Recent Labwork: 03/22/2022: ALT 13; AST 16; BUN 54; Creatinine, Ser 2.92; Hemoglobin 8.9; Magnesium 2.8; Platelets 382; Potassium 3.2; Sodium 130 03/29/2022: TSH 1.113  No results found for: "CHOL", "TRIG", "HDL", "CHOLHDL", "VLDL", "LDLCALC", "LDLDIRECT"  Other Studies Reviewed Today:   Assessment and Plan: Patient is a 67 year old F known to have HTN, OSA on CPAP, multiple biloma in remission with no evidence of cardiac amyloidosis per cardiac MRI in 2021, CKD stage IIIb presented to cardiology clinic for follow-up visit.  # Multiple myeloma in remission -Cardiac MRI showed no evidence of cardiac amyloidosis  # OSA on CPAP -Continue CPAP, compliant  # Morbid obesity on Ozempic # SOB, multifactorial from morbid obesity, deconditioning, possible HFpEF -Weight loss counseling provided, diet and exercise. Recommended water aerobics due to knee issues but patient is afraid of water. Diuretic regimen is managed by her nephrologist due to CKD stage IIIb.  I have spent a total of 33 minutes with patient reviewing chart, EKGs, labs and examining patient as well as establishing an assessment and plan that was discussed with the patient.  > 50% of time was spent in direct patient care.     Medication Adjustments/Labs and Tests Ordered: Current medicines are reviewed at length with the patient today.  Concerns regarding medicines are outlined above.   Tests Ordered: No orders of the defined types were placed in this encounter.   Medication Changes: No orders of the defined types were placed in this encounter.   Disposition:  Follow up  PRN  Signed, Abdel Effinger Fidel Levy, MD, 04/26/2022 12:07 PM    [facility] [place]. [address], [place], [place]  [address]

## 2022-04-26 NOTE — Patient Instructions (Signed)
Medication Instructions:  Your physician recommends that you continue on your current medications as directed. Please refer to the Current Medication list given to you today.  *If you need a refill on your cardiac medications before your next appointment, please call your pharmacy*   Lab Work: NONE   If you have labs (blood work) drawn today and your tests are completely normal, you will receive your results only by: MyChart Message (if you have MyChart) OR A paper copy in the mail If you have any lab test that is abnormal or we need to change your treatment, we will call you to review the results.   Testing/Procedures: NONE    Follow-Up: At [facility], you and your health needs are our priority.  As part of our continuing mission to provide you with exceptional heart care, we have created designated Provider Care Teams.  These Care Teams include your primary Cardiologist (physician) and Advanced Practice Providers (APPs -  Physician Assistants and Nurse Practitioners) who all work together to provide you with the care you need, when you need it.  We recommend signing up for the patient portal called "MyChart".  Sign up information is provided on this After Visit Summary.  MyChart is used to connect with patients for Virtual Visits (Telemedicine).  Patients are able to view lab/test results, encounter notes, upcoming appointments, etc.  Non-urgent messages can be sent to your provider as well.   To learn more about what you can do with MyChart, go to [email or web address].    Your next appointment:    As Needed   Provider:   Vishnu Mallipeddi, MD    Other Instructions Thank you for choosing [facility]!    

## 2022-05-02 ENCOUNTER — Other Ambulatory Visit: Payer: Self-pay

## 2022-05-02 MED ORDER — ROSUVASTATIN CALCIUM 10 MG PO TABS: 10.0000 mg | ORAL_TABLET | Freq: Every day | ORAL | 1 refills | Status: DC

## 2022-05-08 ENCOUNTER — Other Ambulatory Visit: Payer: Self-pay

## 2022-05-08 MED ORDER — POMALIDOMIDE 4 MG PO CAPS: 4.0000 mg | ORAL_CAPSULE | Freq: Every day | ORAL | 0 refills | Status: DC

## 2022-05-08 NOTE — Telephone Encounter (Signed)
Chart reviewed. Pomalyst refilled per last office note with Dr. Katragadda.  

## 2022-05-09 ENCOUNTER — Other Ambulatory Visit: Payer: Self-pay

## 2022-05-09 DIAGNOSIS — G8929 Other chronic pain: Secondary | ICD-10-CM

## 2022-05-09 MED ORDER — OXYCODONE-ACETAMINOPHEN 5-325 MG PO TABS: ORAL_TABLET | ORAL | 0 refills | Status: DC

## 2022-05-30 ENCOUNTER — Other Ambulatory Visit: Payer: Self-pay

## 2022-05-30 MED ORDER — POMALIDOMIDE 4 MG PO CAPS: 4.0000 mg | ORAL_CAPSULE | Freq: Every day | ORAL | 0 refills | Status: DC

## 2022-05-30 NOTE — Telephone Encounter (Signed)
Chart reviewed. Pomalyst refilled per last office note with Dr. Katragadda.  

## 2022-06-06 ENCOUNTER — Other Ambulatory Visit: Payer: Self-pay | Admitting: Internal Medicine

## 2022-06-08 ENCOUNTER — Other Ambulatory Visit: Payer: Self-pay

## 2022-06-08 DIAGNOSIS — G8929 Other chronic pain: Secondary | ICD-10-CM

## 2022-06-08 MED ORDER — OXYCODONE-ACETAMINOPHEN 5-325 MG PO TABS: ORAL_TABLET | ORAL | 0 refills | Status: DC

## 2022-06-12 ENCOUNTER — Other Ambulatory Visit (HOSPITAL_COMMUNITY)
Admission: RE | Admit: 2022-06-12 | Discharge: 2022-06-12 | Disposition: A | Discharge status: Home / Self Care | Payer: 59 | Source: Ambulatory Visit | Attending: Nephrology | Admitting: Nephrology

## 2022-06-12 DIAGNOSIS — D631 Anemia in chronic kidney disease: Secondary | ICD-10-CM | POA: Insufficient documentation

## 2022-06-12 DIAGNOSIS — D509 Iron deficiency anemia, unspecified: Secondary | ICD-10-CM | POA: Insufficient documentation

## 2022-06-12 DIAGNOSIS — R809 Proteinuria, unspecified: Secondary | ICD-10-CM | POA: Diagnosis not present

## 2022-06-12 DIAGNOSIS — N1831 Chronic kidney disease, stage 3a: Secondary | ICD-10-CM | POA: Diagnosis not present

## 2022-06-12 DIAGNOSIS — N1832 Chronic kidney disease, stage 3b: Secondary | ICD-10-CM | POA: Insufficient documentation

## 2022-06-12 DIAGNOSIS — Z79899 Other long term (current) drug therapy: Secondary | ICD-10-CM | POA: Diagnosis not present

## 2022-06-12 LAB — PROTEIN / CREATININE RATIO, URINE
Creatinine, Urine: 37 mg/dL
Protein Creatinine Ratio: 0.35 mg/mg{Cre} — ABNORMAL HIGH (ref 0.00–0.15)
Total Protein, Urine: 13 mg/dL

## 2022-06-12 LAB — RENAL FUNCTION PANEL
Albumin: 4.1 g/dL (ref 3.5–5.0)
Anion gap: 11 (ref 5–15)
BUN: 32 mg/dL — ABNORMAL HIGH (ref 8–23)
CO2: 29 mmol/L (ref 22–32)
Calcium: 9.8 mg/dL (ref 8.9–10.3)
Chloride: 95 mmol/L — ABNORMAL LOW (ref 98–111)
Creatinine, Ser: 1.81 mg/dL — ABNORMAL HIGH (ref 0.44–1.00)
GFR, Estimated: 31 mL/min — ABNORMAL LOW (ref >60)
Glucose, Bld: 100 mg/dL — ABNORMAL HIGH (ref 70–99)
Phosphorus: 3.4 mg/dL (ref 2.5–4.6)
Potassium: 4.1 mmol/L (ref 3.5–5.1)
Sodium: 135 mmol/L (ref 135–145)

## 2022-06-12 LAB — CBC
HCT: 35 % — ABNORMAL LOW (ref 36.0–46.0)
Hemoglobin: 11.1 g/dL — ABNORMAL LOW (ref 12.0–15.0)
MCH: 30.3 pg (ref 26.0–34.0)
MCHC: 31.7 g/dL (ref 30.0–36.0)
MCV: 95.6 fL (ref 80.0–100.0)
Platelets: 276 10*3/uL (ref 150–400)
RBC: 3.66 MIL/uL — ABNORMAL LOW (ref 3.87–5.11)
RDW: 14.4 % (ref 11.5–15.5)
WBC: 7.1 10*3/uL (ref 4.0–10.5)
nRBC: 0 % (ref 0.0–0.2)

## 2022-06-20 ENCOUNTER — Ambulatory Visit: Payer: 59 | Admitting: Pulmonary Disease

## 2022-06-21 ENCOUNTER — Inpatient Hospital Stay: Payer: 59 | Attending: Hematology

## 2022-06-21 DIAGNOSIS — R600 Localized edema: Secondary | ICD-10-CM | POA: Insufficient documentation

## 2022-06-21 DIAGNOSIS — R0602 Shortness of breath: Secondary | ICD-10-CM | POA: Insufficient documentation

## 2022-06-21 DIAGNOSIS — M2550 Pain in unspecified joint: Secondary | ICD-10-CM | POA: Diagnosis not present

## 2022-06-21 DIAGNOSIS — Z809 Family history of malignant neoplasm, unspecified: Secondary | ICD-10-CM | POA: Insufficient documentation

## 2022-06-21 DIAGNOSIS — M7989 Other specified soft tissue disorders: Secondary | ICD-10-CM | POA: Insufficient documentation

## 2022-06-21 DIAGNOSIS — G629 Polyneuropathy, unspecified: Secondary | ICD-10-CM | POA: Diagnosis not present

## 2022-06-21 DIAGNOSIS — D508 Other iron deficiency anemias: Secondary | ICD-10-CM

## 2022-06-21 DIAGNOSIS — Z79899 Other long term (current) drug therapy: Secondary | ICD-10-CM | POA: Diagnosis not present

## 2022-06-21 DIAGNOSIS — M25562 Pain in left knee: Secondary | ICD-10-CM | POA: Insufficient documentation

## 2022-06-21 DIAGNOSIS — E611 Iron deficiency: Secondary | ICD-10-CM | POA: Diagnosis not present

## 2022-06-21 DIAGNOSIS — C9000 Multiple myeloma not having achieved remission: Secondary | ICD-10-CM

## 2022-06-21 DIAGNOSIS — Z95828 Presence of other vascular implants and grafts: Secondary | ICD-10-CM

## 2022-06-21 DIAGNOSIS — D631 Anemia in chronic kidney disease: Secondary | ICD-10-CM | POA: Insufficient documentation

## 2022-06-21 DIAGNOSIS — I10 Essential (primary) hypertension: Secondary | ICD-10-CM | POA: Diagnosis not present

## 2022-06-21 DIAGNOSIS — E876 Hypokalemia: Secondary | ICD-10-CM | POA: Diagnosis not present

## 2022-06-21 DIAGNOSIS — Z8249 Family history of ischemic heart disease and other diseases of the circulatory system: Secondary | ICD-10-CM | POA: Insufficient documentation

## 2022-06-21 DIAGNOSIS — M25561 Pain in right knee: Secondary | ICD-10-CM | POA: Insufficient documentation

## 2022-06-21 DIAGNOSIS — Z8042 Family history of malignant neoplasm of prostate: Secondary | ICD-10-CM | POA: Diagnosis not present

## 2022-06-21 LAB — CBC WITH DIFFERENTIAL/PLATELET
Abs Immature Granulocytes: 0.48 10*3/uL — ABNORMAL HIGH (ref 0.00–0.07)
Basophils Absolute: 0 10*3/uL (ref 0.0–0.1)
Basophils Relative: 0 %
Eosinophils Absolute: 0 10*3/uL (ref 0.0–0.5)
Eosinophils Relative: 0 %
HCT: 31.7 % — ABNORMAL LOW (ref 36.0–46.0)
Hemoglobin: 10.4 g/dL — ABNORMAL LOW (ref 12.0–15.0)
Immature Granulocytes: 5 %
Lymphocytes Relative: 11 %
Lymphs Abs: 1.1 10*3/uL (ref 0.7–4.0)
MCH: 30.7 pg (ref 26.0–34.0)
MCHC: 32.8 g/dL (ref 30.0–36.0)
MCV: 93.5 fL (ref 80.0–100.0)
Monocytes Absolute: 0.7 10*3/uL (ref 0.1–1.0)
Monocytes Relative: 7 %
Neutro Abs: 7.7 10*3/uL (ref 1.7–7.7)
Neutrophils Relative %: 77 %
Platelets: 286 10*3/uL (ref 150–400)
RBC: 3.39 MIL/uL — ABNORMAL LOW (ref 3.87–5.11)
RDW: 14.5 % (ref 11.5–15.5)
WBC: 9.9 10*3/uL (ref 4.0–10.5)
nRBC: 0 % (ref 0.0–0.2)

## 2022-06-21 LAB — COMPREHENSIVE METABOLIC PANEL
ALT: 13 U/L (ref 0–44)
AST: 18 U/L (ref 15–41)
Albumin: 3.7 g/dL (ref 3.5–5.0)
Alkaline Phosphatase: 49 U/L (ref 38–126)
Anion gap: 13 (ref 5–15)
BUN: 39 mg/dL — ABNORMAL HIGH (ref 8–23)
CO2: 27 mmol/L (ref 22–32)
Calcium: 9.7 mg/dL (ref 8.9–10.3)
Chloride: 94 mmol/L — ABNORMAL LOW (ref 98–111)
Creatinine, Ser: 2 mg/dL — ABNORMAL HIGH (ref 0.44–1.00)
GFR, Estimated: 27 mL/min — ABNORMAL LOW (ref >60)
Glucose, Bld: 166 mg/dL — ABNORMAL HIGH (ref 70–99)
Potassium: 3.5 mmol/L (ref 3.5–5.1)
Sodium: 134 mmol/L — ABNORMAL LOW (ref 135–145)
Total Bilirubin: 0.7 mg/dL (ref 0.3–1.2)
Total Protein: 6.9 g/dL (ref 6.5–8.1)

## 2022-06-21 LAB — IRON AND TIBC
Iron: 96 ug/dL (ref 28–170)
Saturation Ratios: 36 % — ABNORMAL HIGH (ref 10.4–31.8)
TIBC: 265 ug/dL (ref 250–450)
UIBC: 169 ug/dL

## 2022-06-21 LAB — FERRITIN: Ferritin: 310 ng/mL — ABNORMAL HIGH (ref 11–307)

## 2022-06-21 LAB — TSH: TSH: 0.636 u[IU]/mL (ref 0.350–4.500)

## 2022-06-21 MED ORDER — HEPARIN SOD (PORK) LOCK FLUSH 100 UNIT/ML IV SOLN
500.0000 [IU] | Freq: Once | INTRAVENOUS | Status: AC
  Administered 2022-06-21: 500 [IU] via INTRAVENOUS

## 2022-06-21 MED ORDER — SODIUM CHLORIDE 0.9% FLUSH
10.0000 mL | Freq: Once | INTRAVENOUS | Status: AC
  Administered 2022-06-21: 10 mL via INTRAVENOUS

## 2022-06-21 NOTE — Patient Instructions (Signed)
[facility]   Discharge Instructions: Thank you for choosing [facility] to provide your oncology and hematology care.  If you have a lab appointment with the Cancer Center - please note that after April 8th, 2024, all labs will be drawn in the cancer center.  You do not have to check in or register with the main entrance as you have in the past but will complete your check-in in the cancer center.  Wear comfortable clothing and clothing appropriate for easy access to any Portacath or PICC line.   We strive to give you quality time with your provider. You may need to reschedule your appointment if you arrive late (15 or more minutes).  Arriving late affects you and other patients whose appointments are after yours.  Also, if you miss three or more appointments without notifying the office, you may be dismissed from the clinic at the provider's discretion.      For prescription refill requests, have your pharmacy contact our office and allow 72 hours for refills to be completed.    Today you received the following port flush with lab, return as scheduled.  To help prevent nausea and vomiting after your treatment, we encourage you to take your nausea medication as directed.  BELOW ARE SYMPTOMS THAT SHOULD BE REPORTED IMMEDIATELY: *FEVER GREATER THAN 100.4 F (38 C) OR HIGHER *CHILLS OR SWEATING *NAUSEA AND VOMITING THAT IS NOT CONTROLLED WITH YOUR NAUSEA MEDICATION *UNUSUAL SHORTNESS OF BREATH *UNUSUAL BRUISING OR BLEEDING *URINARY PROBLEMS (pain or burning when urinating, or frequent urination) *BOWEL PROBLEMS (unusual diarrhea, constipation, pain near the anus) TENDERNESS IN MOUTH AND THROAT WITH OR WITHOUT PRESENCE OF ULCERS (sore throat, sores in mouth, or a toothache) UNUSUAL RASH, SWELLING OR PAIN  UNUSUAL VAGINAL DISCHARGE OR ITCHING   Items with * indicate a potential emergency and should be followed up as soon as possible or go to the Emergency  Department if any problems should occur.  Please show the CHEMOTHERAPY ALERT CARD or IMMUNOTHERAPY ALERT CARD at check-in to the Emergency Department and triage nurse.  Should you have questions after your visit or need to cancel or reschedule your appointment, please contact [facility] AT  [phone]  and follow the prompts.  Office hours are 8:00 a.m. to 4:30 p.m. Monday - Friday. Please note that voicemails left after 4:00 p.m. may not be returned until the following business day.  We are closed weekends and major holidays. You have access to a nurse at all times for urgent questions. Please call the main number to the clinic [phone] and follow the prompts.  For any non-urgent questions, you may also contact your provider using MyChart. We now offer e-Visits for anyone 18 and older to request care online for non-urgent symptoms. For details visit [email or web address].   Also download the MyChart app! Go to the app store, search "MyChart", open the app, select [facility], and log in with your MyChart username and password.   

## 2022-06-21 NOTE — Progress Notes (Signed)
Port flushed with good blood return noted. No bruising or swelling at site. Bandaid applied and patient discharged in satisfactory condition. VVS stable with no signs or symptoms of distressed noted. 

## 2022-06-22 LAB — KAPPA/LAMBDA LIGHT CHAINS
Kappa free light chain: 37.3 mg/L — ABNORMAL HIGH (ref 3.3–19.4)
Kappa, lambda light chain ratio: 1.59 (ref 0.26–1.65)
Lambda free light chains: 23.5 mg/L (ref 5.7–26.3)

## 2022-06-23 ENCOUNTER — Ambulatory Visit (INDEPENDENT_AMBULATORY_CARE_PROVIDER_SITE_OTHER): Payer: 59 | Admitting: Pulmonary Disease

## 2022-06-23 ENCOUNTER — Encounter: Payer: Self-pay | Admitting: Pulmonary Disease

## 2022-06-23 VITALS — BP 113/73 | HR 81 | Ht 62.0 in | Wt 287.4 lb

## 2022-06-23 DIAGNOSIS — G4734 Idiopathic sleep related nonobstructive alveolar hypoventilation: Secondary | ICD-10-CM | POA: Diagnosis not present

## 2022-06-23 DIAGNOSIS — G4733 Obstructive sleep apnea (adult) (pediatric): Secondary | ICD-10-CM | POA: Diagnosis not present

## 2022-06-23 NOTE — Progress Notes (Signed)
   Subjective:    Patient ID: Kelli Hurley, female    DOB: 10-12-1955, 67 y.o.   MRN: [id number]  HPI  67 yo morbidly obese never smoker for follow-up of OSA and nocturnal hypoxia She could not tolerate CPAP and was placed on BiPAP with oxygen blended)    PMH -multiple myeloma status post bone marrow transplantation 2017, on Pomalyst -HFpEF, cardiac MRI neg -CKD stage II -wheelchair due to severe osteoarthritis   Chief Complaint  Patient presents with   Follow-up    Pt f/u states that she is doing well breathing is improving, CPAP use is going well. No current questions or concerns.    22-month follow-up visit.  She arrives accompanied by her niece and sister.  She is settling down on fullface mask with her BiPAP.  She is very compliant.  This is helping her daytime somnolence and fatigue She continues on chemotherapy pill 21 days off, 7 days off for multiple myeloma Breathing is stable, she is more limited by severe osteoarthritis in her knees and by dyspnea She has lost 40 pounds, on Ozempic injections  Significant tests/ events reviewed  Split study 07/2021 >> AHI 21/h, low sat 65%, could not tolerate higher pressure on CPAP due to dificulty exhaling , Corrected by BiPAP 13/9 + 2 L O2 blended   07/26/2020   Walked RA  approx   200 ft  @ slow/ waddling pace  stopped due to  Sob and sats 87% "much more than usual activity" - Echo 02/19/19 nl x for mild LAE  - CTa 02/24/19 no large PE, no ILD  - Neg MR cardiac 03/07/19 neg for amyloid, nl ef  - PFT's  05/20/19   FEV1 1.92 (103 % ) ratio 0.91  p 6 % improvement from saba p nothing prior to study with DLCO  11.73 (62%) corrects to 3.66 (85 %)  for alv volume and FV curve nl and ERV 36%     08/2019 ONO slept for 8 hours and 43 minutes, testing showing time spent below 88% 4 hours and 6 minutes, lowest oxygen level appears to be 68% rec 2lpm hs        Review of Systems neg for any significant sore throat, dysphagia, itching,  sneezing, nasal congestion or excess/ purulent secretions, fever, chills, sweats, unintended wt loss, pleuritic or exertional cp, hempoptysis, orthopnea pnd or change in chronic leg swelling. Also denies presyncope, palpitations, heartburn, abdominal pain, nausea, vomiting, diarrhea or change in bowel or urinary habits, dysuria,hematuria, rash, arthralgias, visual complaints, headache, numbness weakness or ataxia.     Objective:   Physical Exam   Gen. Pleasant, obese, in no distress ENT - no lesions, no post nasal drip Neck: No JVD, no thyromegaly, no carotid bruits Lungs: no use of accessory muscles, no dullness to percussion, decreased without rales or rhonchi  Cardiovascular: Rhythm regular, heart sounds  normal, no murmurs or gallops, no peripheral edema Musculoskeletal: No deformities, no cyanosis or clubbing , no tremors        Assessment & Plan:

## 2022-06-23 NOTE — Patient Instructions (Signed)
BiPAP is working well  X check ONO on biPAP/RA

## 2022-06-23 NOTE — Assessment & Plan Note (Signed)
She has lost significant weight and we will reassess nocturnal oximetry on BiPAP/room air to see if she can still requires oxygen

## 2022-06-23 NOTE — Assessment & Plan Note (Signed)
BiPAP download was reviewed, on 13/9, no residual events.  She has a large leak.  She is very compliant about 4.5 hours on average without a single missed night. I discussed various types of mask interfaces and she will trial AirFit F30 fullface mask BiPAP is definitely helped improve her daytime somnolence and fatigue  Weight loss encouraged, compliance with goal of at least 4-6 hrs every night is the expectation. Advised against medications with sedative side effects Cautioned against driving when sleepy - understanding that sleepiness will vary on a day to day basis

## 2022-06-26 ENCOUNTER — Other Ambulatory Visit: Payer: Self-pay | Admitting: Hematology

## 2022-06-27 LAB — PROTEIN ELECTROPHORESIS, SERUM
A/G Ratio: 1.2 (ref 0.7–1.7)
Albumin ELP: 3.5 g/dL (ref 2.9–4.4)
Alpha-1-Globulin: 0.3 g/dL (ref 0.0–0.4)
Alpha-2-Globulin: 0.8 g/dL (ref 0.4–1.0)
Beta Globulin: 1 g/dL (ref 0.7–1.3)
Gamma Globulin: 0.7 g/dL (ref 0.4–1.8)
Globulin, Total: 2.9 g/dL (ref 2.2–3.9)
M-Spike, %: 0.2 g/dL — ABNORMAL HIGH
Total Protein ELP: 6.4 g/dL (ref 6.0–8.5)

## 2022-06-27 NOTE — Progress Notes (Signed)
[facility] [address] [place] [address]    Clinic Day:  06/28/2022  Referring physician: [facility]  Patient Care Team: [facility] as PCP - General Izora Ribas, Rondel Jumbo, MD as PCP - Cardiology (Cardiology) Doreatha Massed, MD as Consulting Physician (Medical Oncology) Nyoka Cowden, MD as Consulting Physician (Pulmonary Disease)   ASSESSMENT & PLAN:   Assessment: 1.  Relapsed IgG kappa multiple myeloma with high risk features: -5 cycles of carfilzomib, pomalidomide and dexamethasone from 03/05/2019 through 06/25/2019. -Myeloma panel on 07/09/2019 shows M spike 0.3 g, slightly up from 0.2 g previously.  However free light chain ratio has 1.37 and improved.  Kappa light chains are 24.8. - Carfilzomib discontinued after 08/06/2019 secondary to fluid retention. - She is currently on Pomalyst 4 mg 3 weeks on 1 week off. - PET scan on 03/22/2020 with no evidence of malignancy. - Bone marrow biopsy on 03/19/2020 showed approximately 5% plasma cells.  Chromosome analysis and FISH panel were normal.   2.  Shortness of breath on exertion: -2D echo on 02/09/2019 shows EF 60-65%.  No LVH. -Chest CT PE protocol on 02/24/2019 was negative. -Cardiac MRI on 03/07/2019 shows LVEF 56% with no amyloid.  Troponin T and proBNP were negative. -PFTs show low expiratory reserve volume consistent with body habitus.  Moderate diffusion defect which corrects to normal.  FVC, FEV1, FEV1/FVC ratio are within normal limits.  FVC is reduced relative to SVC indicates air-trapping.  No significant response after bronchodilators.  Reduced diffusion capacity indicates moderate loss of functional alveolar capillary space. -She was evaluated by Dr. Sherene Sires. -2D echocardiogram on 08/27/2019 shows LVEF 60 to 65%.  No LVH.   3.  Myeloma bone disease: -Bone density on 07/04/2019 shows T score 1.8.    Plan: 1.  Relapsed IgG kappa multiple myeloma with high  risk features: - Reviewed myeloma labs from 06/21/2022: M spike is stable at 0.2 g.  Kappa light chains at 37.3 with normal ratio of 1.59.  Creatinine is 2.0 with calcium 9.7. - Continue pomalidomide 4 mg 3 weeks on/1 week off.  Continue dexamethasone 20 mg weekly. - Recommend RTC 12 weeks for follow-up with repeat myeloma labs.   2.  Normocytic anemia: - Normocytic anemia from CKD and functional iron deficiency. - Ferritin is 310 with percent saturation 36.  Hemoglobin is 10.4.  Will closely monitor with repeat labs in 3 months.   3.  Thromboprophylaxis: - Continue aspirin 81 mg daily.   4.  Neuropathy: - She is taking gabapentin 300 mg twice daily.  She could not tolerate 3 times daily dose because of shaking in the hands. - She reports that she has pins and needle sensation in bilateral toes and right hand, slightly worse than before. - Recommend discontinuing gabapentin. - Will start her on Lyrica 75 mg once daily, adjusted to renal function.  Will titrate up as needed.   5.  Lower extremity edema: - Continue Lasix 40 mg twice daily and metolazone 2.5 mg 3 times weekly.   6.  Myeloma bone disease: - Zometa held after last infusion on 01/27/2020 due to worsening renal function.   7.  Hypokalemia: - Continue potassium 2 tablets daily at home.  Potassium is normal.   8.  Bilateral knee pains: - Continue Percocet twice daily as needed.    No orders of the defined types were placed in this encounter.     I,Katie Daubenspeck,acting as a Neurosurgeon  for Doreatha Massed, MD.,have documented all relevant documentation on the behalf of Doreatha Massed, MD,as directed by  Doreatha Massed, MD while in the presence of Doreatha Massed, MD.   I, Doreatha Massed MD, have reviewed the above documentation for accuracy and completeness, and I agree with the above.   Doreatha Massed, MD   5/22/20246:20 PM  CHIEF COMPLAINT:   Diagnosis: multiple myeloma    Cancer  Staging  Multiple myeloma [facility]) Staging form: Multiple Myeloma, AJCC 6th Edition - Clinical stage from 10/26/2014: Stage IIA - Signed by Ellouise Newer, PA-C on 10/26/2014 - Pathologic: No stage assigned - Unsigned    Prior Therapy: 1. Bortezomib x 8 cycles from 10/05/2014 to 04/06/2016. 2. Carfilzomib x 6 cycles from 03/05/2019 to 08/06/2019.  Current Therapy:  Pomalyst 4 mg 3/4 weeks    HISTORY OF PRESENT ILLNESS:   Oncology History  Multiple myeloma (HCC)  08/07/2014 Imaging   Bone Survey- No lytic lesions are noted in the visualized skeleton.   09/03/2014 Bone Marrow Biopsy   NORMOCELLULAR BONE MARROW FOR AGE WITH PLASMA CELL NEOPLASM.  The plasma cell component is increased in the marrow representing an estimated 18% of all cells. Cytogenetics with 13q-, 17p- (high risk disease)   09/03/2014 Pathology Results   Cytogenetics with 13q-, 17p- (high risk disease)   09/23/2014 Initial Diagnosis   Multiple myeloma   09/30/2014 PET scan   No abnormal hypermetabolism in the neck, chest, abdomen or pelvis.   10/05/2014 - 03/22/2015 Chemotherapy   RVD   10/28/2014 Treatment Plan Change   Issues related to getting Revlimid in a timely fashion, therefore, she received her Revlimid on 9/21 resulting in a 12 day cycle this time instead of a 14 day cycle   11/02/2014 Imaging   CTA chest- No evidence for a large or central pulmonary embolism as described.  8 mm density along the right minor fissure could represent focal pleural thickening but indeterminate. If the patient is at high risk for bronchogenic carcinoma, follow-up c   11/23/2014 Miscellaneous   Zometa 4 mg IV monthly   04/07/2015 Bone Marrow Biopsy   Normocellular marrow with 2-4% clonal plasma cells by immunohistochemistry. FISH and cytogenetics were normal [facility])     04/2015 Miscellaneous   PRETRANSPLANT EVALUATION:  Pulmonary function tests: FEV1 100.3% / DLCO 97.9%  Echocardiogram: Normal LV function with EF 60-65%     04/27/2015 Procedure   Stem cell mobilization with filgrastim and Mozobil [facility])   05/06/2015 Miscellaneous   BMT conditioning regimen with high-dose Melphalan given [facility], Zigmund Daniel); Day -1   05/07/2015 Bone Marrow Transplant   Outpatient autologous stem cell transplant [facility], Zigmund Daniel); Day 0   05/18/2015 Miscellaneous   WBC engraftment;  did not require platelet transfusion during her transplant process. Lowest platelet count 28,000    05/20/2015 Procedure   Tunneled catheter removed [facility])   09/08/2015 -  Chemotherapy   Velcade every 2 weeks   12/15/2015 Miscellaneous   Zometa re-instituted.    12/23/2015 Imaging   Bone density- BMD as determined from Forearm Radius 33% is 0.799 g/cm2 with a T-Score of 1.2. This patient is considered normal according to World Health Organization [facility]) criteria.   04/17/2016 Miscellaneous   Started maintenance with Ninlaro.   05/04/2016 Treatment Plan Change   Zometa switched to Xgeva injection monthly given difficult IV access.    03/05/2019 - 08/06/2019 Chemotherapy   The patient had carfilzomib (KYPROLIS) 40 mg in dextrose 5 % 50 mL chemo infusion, 18  mg/m2 = 44 mg, Intravenous,  Once, 6 of 6 cycles Dose modification: 56 mg/m2 (original dose 56 mg/m2, Cycle 1, Reason: Provider Judgment) Administration: 40 mg (03/05/2019), 120 mg (03/12/2019), 120 mg (03/20/2019), 120 mg (04/02/2019), 120 mg (04/09/2019), 120 mg (04/16/2019), 120 mg (04/30/2019), 120 mg (05/08/2019), 120 mg (05/14/2019), 120 mg (05/28/2019), 120 mg (06/04/2019), 120 mg (06/11/2019), 120 mg (06/25/2019), 120 mg (07/02/2019), 120 mg (07/09/2019), 120 mg (07/23/2019), 120 mg (07/30/2019), 120 mg (08/06/2019)  for chemotherapy treatment.       INTERVAL HISTORY:   Gustine is a 67 y.o. female presenting to clinic today for follow up of multiple myeloma. She was last seen by me on 03/29/22.  Today, she states that she is doing well overall. Her appetite level is at 100%. Her energy level is  at 75%.  PAST MEDICAL HISTORY:   Past Medical History: Past Medical History:  Diagnosis Date   Anemia    Arthritis    Cancer (HCC)    Claustrophobia 10/05/2014   Hypertension    Leukopenia 08/03/2014   Normocytic hypochromic anemia 08/03/2014   Renal disorder    stage 3     Surgical History: Past Surgical History:  Procedure Laterality Date   ABDOMINAL HYSTERECTOMY     CARDIAC SURGERY     36 months old. States she had a leaky valve.    COLONOSCOPY  03/26/2009   Dr. Darrick Penna; normal colon, small internal hemorrhoids.  Recommended repeat colonoscopy in 10 years.   COLONOSCOPY WITH PROPOFOL N/A 11/25/2019   Procedure: COLONOSCOPY WITH PROPOFOL;  Surgeon: Lanelle Bal, DO;  Location: [facility];  Service: Endoscopy;  Laterality: N/A;  10:45am   OTHER SURGICAL HISTORY     heart surgery as infant to "repair hole in heart"   PORTACATH PLACEMENT Left 02/21/2019   Procedure: INSERTION PORT-A-CATH;  Surgeon: Lucretia Roers, MD;  Location: [facility] ORS;  Service: General;  Laterality: Left;    Social History: Social History   Socioeconomic History   Marital status: Legally Separated    Spouse name: Not on file   Number of children: Not on file   Years of education: Not on file   Highest education level: Not on file  Occupational History   Not on file  Tobacco Use   Smoking status: Never   Smokeless tobacco: Never  Vaping Use   Vaping Use: Never used  Substance and Sexual Activity   Alcohol use: No   Drug use: No   Sexual activity: Not on file    Comment: divorced- 2 daughters  Other Topics Concern   Not on file  Social History Narrative   Not on file   Social Determinants of Health   Financial Resource Strain: Low Risk  (05/31/2020)   Overall Financial Resource Strain (CARDIA)    Difficulty of Paying Living Expenses: Not hard at all  Food Insecurity: No Food Insecurity (05/31/2020)   Hunger Vital Sign    Worried About Running Out of Food in the Last Year: Never  true    Ran Out of Food in the Last Year: Never true  Transportation Needs: No Transportation Needs (05/31/2020)   PRAPARE - Administrator, Civil Service (Medical): No    Lack of Transportation (Non-Medical): No  Physical Activity: Inactive (05/31/2020)   Exercise Vital Sign    Days of Exercise per Week: 0 days    Minutes of Exercise per Session: 0 min  Stress: No Stress Concern Present (05/31/2020)   [employer]  of Occupational Health - Occupational Stress Questionnaire    Feeling of Stress : Not at all  Social Connections: Unknown (05/31/2020)   Social Connection and Isolation Panel [NHANES]    Frequency of Communication with Friends and Family: More than three times a week    Frequency of Social Gatherings with Friends and Family: Once a week    Attends Religious Services: More than 4 times per year    Active Member of [employer] or Organizations: No    Attends Banker Meetings: Never    Marital Status: Not on file  Intimate Partner Violence: Not At Risk (05/31/2020)   Humiliation, Afraid, Rape, and Kick questionnaire    Fear of Current or Ex-Partner: No    Emotionally Abused: No    Physically Abused: No    Sexually Abused: No    Family History: Family History  Problem Relation Age of Onset   Cancer Mother    Hypertension Mother    Cancer Father    Hypertension Father    Cancer Maternal Grandmother    Hypertension Sister    Hypertension Brother    Prostate cancer Brother    Colon cancer Neg Hx    Colon polyps Neg Hx     Current Medications:  Current Outpatient Medications:    acetaminophen (TYLENOL) 650 MG CR tablet, Take 650 mg by mouth every 8 (eight) hours as needed for pain., Disp: , Rfl:    acyclovir (ZOVIRAX) 400 MG tablet, TAKE ONE (1) TABLET BY MOUTH TWICE DAILY, Disp: 60 tablet, Rfl: 10   albuterol (VENTOLIN HFA) 108 (90 Base) MCG/ACT inhaler, Inhale 2 puffs into the lungs every 6 (six) hours as needed for wheezing or shortness of  breath., Disp: 8 g, Rfl: 3   albuterol (VENTOLIN HFA) 108 (90 Base) MCG/ACT inhaler, 2 puffs as needed, Disp: , Rfl:    ALPRAZolam (XANAX) 1 MG tablet, Take 1 mg by mouth daily as needed., Disp: , Rfl:    aspirin EC 81 MG tablet, Take 81 mg by mouth daily., Disp: , Rfl:    calcitRIOL (ROCALTROL) 0.25 MCG capsule, Take by mouth., Disp: , Rfl:    cholecalciferol (VITAMIN D) 1000 units tablet, Take 1,000 Units by mouth daily., Disp: , Rfl:    dapagliflozin propanediol (FARXIGA) 10 MG TABS tablet, Take 1 tablet (10 mg total) by mouth daily before breakfast., Disp: 30 tablet, Rfl: 8   dexamethasone (DECADRON) 4 MG tablet, TAKE FIVE TABLETS BY MOUTH ONCE A WEEK, Disp: 30 tablet, Rfl: 6   Elastic Bandages & Supports (MEDICAL COMPRESSION STOCKINGS) MISC, See admin instructions., Disp: , Rfl:    fluticasone (FLONASE) 50 MCG/ACT nasal spray, , Disp: , Rfl:    furosemide (LASIX) 20 MG tablet, TAKE 2 TABLETS BY MOUTH DAILY IN THE MORNING AND TAKE 2 TABLETS AT BEDTIME (Patient taking differently: Take 20 mg by mouth as directed. 2 tablet in the morning and 2 tablets at bedtime on Mon., Wed., and Friday. 1 tablet in the morning and 1 tablet at bedtime on Sun., Tues., Thurs., and Sat.), Disp: 120 tablet, Rfl: 8   gabapentin (NEURONTIN) 300 MG capsule, Take 1 capsule (300 mg total) by mouth 3 (three) times daily. (Patient taking differently: Take 300 mg by mouth 2 (two) times daily.), Disp: 90 capsule, Rfl: 10   Infant Care Products (DERMACLOUD) OINT, Apply to affected area as needed Externally for 30 days, Disp: , Rfl:    lidocaine-prilocaine (EMLA) cream, APPLY 1 QUARTER SIZED AMOUNT 1 HOUR PRIOR TO  TREATMENT, Disp: 30 g, Rfl: 10   losartan (COZAAR) 25 MG tablet, Take 12.5 mg by mouth daily., Disp: , Rfl:    magnesium oxide (MAG-OX) 400 (240 Mg) MG tablet, TAKE 1 TABLET BY MOUTH TWICE DAILY, Disp: 60 tablet, Rfl: 6   metolazone (ZAROXOLYN) 2.5 MG tablet, Take 1 tablet (2.5 mg total) by mouth daily. (Patient taking  differently: Take 2.5 mg by mouth 3 (three) times a week. Mon., Wed., and Friday), Disp: 30 tablet, Rfl: 6   Multiple Vitamin (TAB-A-VITE) TABS, TAKE 1 TABLET BY MOUTH ONCE DAILY. (Patient taking differently: Take 1 tablet by mouth daily.), Disp: 30 tablet, Rfl: 11   oxyCODONE-acetaminophen (PERCOCET/ROXICET) 5-325 MG tablet, TAKE 1 OR 2 TABLETS BY MOUTH EVERY TWELVE HOURS AS NEEDED for severe pain, Disp: 60 tablet, Rfl: 0   pomalidomide (POMALYST) 4 MG capsule, Take 1 capsule (4 mg total) by mouth daily. 21 days on, 7 days off, Disp: 21 capsule, Rfl: 0   potassium chloride SA (KLOR-CON M) 20 MEQ tablet, TAKE 1 TABLET BY MOUTH THREE TIMES DAILY (Patient taking differently: Take 20 mEq by mouth 2 (two) times daily. TAKE 1 TABLET BY MOUTH THREE TIMES DAILY), Disp: 90 tablet, Rfl: 10   pregabalin (LYRICA) 75 MG capsule, Take 1 capsule (75 mg total) by mouth daily., Disp: 30 capsule, Rfl: 2   rosuvastatin (CRESTOR) 10 MG tablet, Take 1 tablet (10 mg total) by mouth daily., Disp: 90 tablet, Rfl: 1   Semaglutide, 1 MG/DOSE, 4 MG/3ML SOPN, Inject 1 mg into the skin once a week., Disp: 3 mL, Rfl: 11   spironolactone (ALDACTONE) 25 MG tablet, Take 12.5 mg by mouth daily., Disp: , Rfl:    tiZANidine (ZANAFLEX) 4 MG capsule, Take 1 capsule (4 mg total) by mouth 3 (three) times daily as needed for muscle spasms. Do not drink alcohol or drive while taking this medication.  May cause drowsiness., Disp: 15 capsule, Rfl: 0   trolamine salicylate (ASPERCREME) 10 % cream, Apply 1 application topically 2 (two) times daily as needed for muscle pain., Disp: , Rfl:  No current facility-administered medications for this visit.  Facility-Administered Medications Ordered in Other Visits:    heparin lock flush 100 unit/mL, 500 Units, Intravenous, Once, Penland, Carollee Herter K, MD   sodium chloride 0.9 % injection 10 mL, 10 mL, Intravenous, Once, Penland, Novella Olive, MD   Allergies: Allergies  Allergen Reactions   Diclofenac  Swelling    Per pt, facial swelling  Other Reaction(s): Other (See Comments)  Per pt, facial swelling, Per pt, facial swelling    REVIEW OF SYSTEMS:   Review of Systems  Constitutional:  Negative for chills, fatigue and fever.  HENT:   Negative for lump/mass, mouth sores, nosebleeds, sore throat and trouble swallowing.   Eyes:  Negative for eye problems.  Respiratory:  Negative for cough and shortness of breath.   Cardiovascular:  Positive for leg swelling. Negative for chest pain and palpitations.  Gastrointestinal:  Negative for abdominal pain, constipation, diarrhea, nausea and vomiting.  Genitourinary:  Negative for bladder incontinence, difficulty urinating, dysuria, frequency, hematuria and nocturia.   Musculoskeletal:  Positive for arthralgias. Negative for back pain, flank pain, myalgias and neck pain.  Skin:  Negative for itching and rash.  Neurological:  Positive for numbness. Negative for dizziness and headaches.  Hematological:  Does not bruise/bleed easily.  Psychiatric/Behavioral:  Negative for depression, sleep disturbance and suicidal ideas. The patient is not nervous/anxious.   All other systems reviewed and are negative.  VITALS:   Blood pressure (!) 144/85, pulse 72, temperature 97.8 F (36.6 C), temperature source Oral, resp. rate 18, weight 295 lb 9.6 oz (134.1 kg), SpO2 97 %.  Wt Readings from Last 3 Encounters:  06/28/22 295 lb 9.6 oz (134.1 kg)  06/23/22 287 lb 6.4 oz (130.4 kg)  04/26/22 289 lb (131.1 kg)    Body mass index is 54.07 kg/m.  Performance status (ECOG): 1 - Symptomatic but completely ambulatory  PHYSICAL EXAM:   Physical Exam Vitals and nursing note reviewed. Exam conducted with a chaperone present.  Constitutional:      Appearance: Normal appearance.  Cardiovascular:     Rate and Rhythm: Normal rate and regular rhythm.     Pulses: Normal pulses.     Heart sounds: Normal heart sounds.  Pulmonary:     Effort: Pulmonary effort  is normal.     Breath sounds: Normal breath sounds.  Abdominal:     Palpations: Abdomen is soft. There is no hepatomegaly, splenomegaly or mass.     Tenderness: There is no abdominal tenderness.  Musculoskeletal:     Right lower leg: No edema.     Left lower leg: No edema.  Lymphadenopathy:     Cervical: No cervical adenopathy.     Right cervical: No superficial, deep or posterior cervical adenopathy.    Left cervical: No superficial, deep or posterior cervical adenopathy.     Upper Body:     Right upper body: No supraclavicular or axillary adenopathy.     Left upper body: No supraclavicular or axillary adenopathy.  Neurological:     General: No focal deficit present.     Mental Status: She is alert and oriented to person, place, and time.  Psychiatric:        Mood and Affect: Mood normal.        Behavior: Behavior normal.     LABS:      Latest Ref Rng & Units 06/21/2022   11:57 AM 06/12/2022   12:00 PM 03/22/2022   10:34 AM  CBC  WBC 4.0 - 10.5 K/uL 9.9  7.1  11.3   Hemoglobin 12.0 - 15.0 g/dL 16.1  09.6  8.9   Hematocrit 36.0 - 46.0 % 31.7  35.0  27.5   Platelets 150 - 400 K/uL 286  276  382       Latest Ref Rng & Units 06/21/2022   11:57 AM 06/12/2022   12:00 PM 03/22/2022   10:34 AM  CMP  Glucose 70 - 99 mg/dL 045  409  811   BUN 8 - 23 mg/dL 39  32  54   Creatinine 0.44 - 1.00 mg/dL 9.14  7.82  9.56   Sodium 135 - 145 mmol/L 134  135  130   Potassium 3.5 - 5.1 mmol/L 3.5  4.1  3.2   Chloride 98 - 111 mmol/L 94  95  88   CO2 22 - 32 mmol/L 27  29  28    Calcium 8.9 - 10.3 mg/dL 9.7  9.8  9.4   Total Protein 6.5 - 8.1 g/dL 6.9   7.1   Total Bilirubin 0.3 - 1.2 mg/dL 0.7   0.4   Alkaline Phos 38 - 126 U/L 49   67   AST 15 - 41 U/L 18   16   ALT 0 - 44 U/L 13   13      No results found for: "CEA1", "CEA" / No results found for: "CEA1", "CEA" No results  found for: "PSA1" No results found for: "CAN199" No results found for: "CAN125"  Lab Results  Component Value  Date   TOTALPROTELP 6.4 06/21/2022   ALBUMINELP 3.5 06/21/2022   A1GS 0.3 06/21/2022   A2GS 0.8 06/21/2022   BETS 1.0 06/21/2022   GAMS 0.7 06/21/2022   MSPIKE 0.2 (H) 06/21/2022   SPEI Comment 06/21/2022   Lab Results  Component Value Date   TIBC 265 06/21/2022   TIBC 204 (L) 02/15/2022   TIBC 276 12/20/2021   FERRITIN 310 (H) 06/21/2022   FERRITIN 1,004 (H) 02/15/2022   FERRITIN 186 12/20/2021   IRONPCTSAT 36 (H) 06/21/2022   IRONPCTSAT 50 (H) 02/15/2022   IRONPCTSAT 18 12/20/2021   Lab Results  Component Value Date   LDH 136 03/22/2022   LDH 134 12/13/2021   LDH 168 09/14/2021     STUDIES:   No results found.

## 2022-06-28 ENCOUNTER — Other Ambulatory Visit: Payer: Self-pay

## 2022-06-28 ENCOUNTER — Encounter: Payer: Self-pay | Admitting: Hematology

## 2022-06-28 ENCOUNTER — Inpatient Hospital Stay (HOSPITAL_BASED_OUTPATIENT_CLINIC_OR_DEPARTMENT_OTHER): Payer: 59 | Admitting: Hematology

## 2022-06-28 VITALS — BP 144/85 | HR 72 | Temp 97.8°F | Resp 18 | Wt 295.6 lb

## 2022-06-28 DIAGNOSIS — C9000 Multiple myeloma not having achieved remission: Secondary | ICD-10-CM

## 2022-06-28 MED ORDER — PREGABALIN 75 MG PO CAPS: 75.0000 mg | ORAL_CAPSULE | Freq: Every day | ORAL | 2 refills | Status: DC

## 2022-06-28 NOTE — Patient Instructions (Addendum)
[facility] at [facility] Discharge Instructions   You were seen and examined today by Dr. Ellin Saba.  He reviewed the results of your lab work which are normal/stable.   Continue Pomalyst as prescribed.   Continue dexamethasone (steroid) 5 tablets weekly.   We will send a prescription to your pharmacy for a medication called Lyrica. This is to help with your neuropathy where the gabapentin is not helping. Do not take both together. Stop the gabapentin and try the Lyrica to see if it helps. Take as prescribed.   Return as scheduled.      Thank you for choosing [facility] at [facility] to provide your oncology and hematology care.  To afford each patient quality time with our provider, please arrive at least 15 minutes before your scheduled appointment time.   If you have a lab appointment with the Cancer Center please come in thru the Main Entrance and check in at the main information desk.  You need to re-schedule your appointment should you arrive 10 or more minutes late.  We strive to give you quality time with our providers, and arriving late affects you and other patients whose appointments are after yours.  Also, if you no show three or more times for appointments you may be dismissed from the clinic at the providers discretion.     Again, thank you for choosing [facility].  Our hope is that these requests will decrease the amount of time that you wait before being seen by our physicians.       _____________________________________________________________  Should you have questions after your visit to [facility], please contact our office at [phone] and follow the prompts.  Our office hours are 8:00 a.m. and 4:30 p.m. Monday - Friday.  Please note that voicemails left after 4:00 p.m. may not be returned until the following business day.  We are closed weekends and major holidays.  You do have access to  a nurse 24-7, just call the main number to the clinic [phone] and do not press any options, hold on the line and a nurse will answer the phone.    For prescription refill requests, have your pharmacy contact our office and allow 72 hours.    Due to Covid, you will need to wear a mask upon entering the hospital. If you do not have a mask, a mask will be given to you at the Main Entrance upon arrival. For doctor visits, patients may have 1 support person age 30 or older with them. For treatment visits, patients can not have anyone with them due to social distancing guidelines and our immunocompromised population.

## 2022-06-28 NOTE — Progress Notes (Signed)
Patient is taking Pomalyst as prescribed.  She has not missed any doses and reports no side effects at this time.   

## 2022-06-29 ENCOUNTER — Other Ambulatory Visit: Payer: Self-pay

## 2022-06-29 LAB — IMMUNOFIXATION ELECTROPHORESIS
IgA: 114 mg/dL (ref 87–352)
IgG (Immunoglobin G), Serum: 733 mg/dL (ref 586–1602)
IgM (Immunoglobulin M), Srm: 52 mg/dL (ref 26–217)
Total Protein ELP: 6.4 g/dL (ref 6.0–8.5)

## 2022-06-29 MED ORDER — POMALIDOMIDE 4 MG PO CAPS: 4.0000 mg | ORAL_CAPSULE | Freq: Every day | ORAL | 0 refills | Status: DC

## 2022-06-29 NOTE — Telephone Encounter (Signed)
Chart reviewed. Pomalyst refilled per last office note with Dr. Katragadda.  

## 2022-07-10 ENCOUNTER — Telehealth: Payer: Self-pay | Admitting: Pulmonary Disease

## 2022-07-10 ENCOUNTER — Other Ambulatory Visit: Payer: Self-pay

## 2022-07-10 ENCOUNTER — Other Ambulatory Visit (HOSPITAL_COMMUNITY)
Admission: RE | Admit: 2022-07-10 | Discharge: 2022-07-10 | Disposition: A | Discharge status: Home / Self Care | Payer: 59 | Source: Ambulatory Visit | Attending: Nephrology | Admitting: Nephrology

## 2022-07-10 DIAGNOSIS — G8929 Other chronic pain: Secondary | ICD-10-CM

## 2022-07-10 DIAGNOSIS — C9000 Multiple myeloma not having achieved remission: Secondary | ICD-10-CM | POA: Diagnosis present

## 2022-07-10 LAB — RENAL FUNCTION PANEL
Albumin: 3.7 g/dL (ref 3.5–5.0)
Anion gap: 10 (ref 5–15)
BUN: 47 mg/dL — ABNORMAL HIGH (ref 8–23)
CO2: 29 mmol/L (ref 22–32)
Calcium: 9.3 mg/dL (ref 8.9–10.3)
Chloride: 96 mmol/L — ABNORMAL LOW (ref 98–111)
Creatinine, Ser: 2.37 mg/dL — ABNORMAL HIGH (ref 0.44–1.00)
GFR, Estimated: 22 mL/min — ABNORMAL LOW (ref >60)
Glucose, Bld: 99 mg/dL (ref 70–99)
Phosphorus: 3.2 mg/dL (ref 2.5–4.6)
Potassium: 4 mmol/L (ref 3.5–5.1)
Sodium: 135 mmol/L (ref 135–145)

## 2022-07-10 LAB — CBC
HCT: 32.4 % — ABNORMAL LOW (ref 36.0–46.0)
Hemoglobin: 10.4 g/dL — ABNORMAL LOW (ref 12.0–15.0)
MCH: 30.6 pg (ref 26.0–34.0)
MCHC: 32.1 g/dL (ref 30.0–36.0)
MCV: 95.3 fL (ref 80.0–100.0)
Platelets: 264 10*3/uL (ref 150–400)
RBC: 3.4 MIL/uL — ABNORMAL LOW (ref 3.87–5.11)
RDW: 14.3 % (ref 11.5–15.5)
WBC: 5.9 10*3/uL (ref 4.0–10.5)
nRBC: 0 % (ref 0.0–0.2)

## 2022-07-10 LAB — PROTEIN / CREATININE RATIO, URINE
Creatinine, Urine: 60 mg/dL
Protein Creatinine Ratio: 0.23 mg/mg{Cre} — ABNORMAL HIGH (ref 0.00–0.15)
Total Protein, Urine: 14 mg/dL

## 2022-07-10 MED ORDER — OXYCODONE-ACETAMINOPHEN 5-325 MG PO TABS: ORAL_TABLET | ORAL | 0 refills | Status: DC

## 2022-07-10 NOTE — Telephone Encounter (Signed)
Called and spoke with patient. She verbalized understanding.   Nothing further needed.  

## 2022-07-10 NOTE — Telephone Encounter (Signed)
ONO on BiPAP/RA showed minimal desat  OK to stop using oxygen during sleep & call us back in 31month & if doing ok, we can dc O2

## 2022-07-11 LAB — PTH, INTACT AND CALCIUM
Calcium, Total (PTH): 9.9 mg/dL (ref 8.7–10.3)
PTH: 66 pg/mL — ABNORMAL HIGH (ref 15–65)

## 2022-07-25 ENCOUNTER — Other Ambulatory Visit: Payer: Self-pay

## 2022-07-25 MED ORDER — POMALIDOMIDE 4 MG PO CAPS: 4.0000 mg | ORAL_CAPSULE | Freq: Every day | ORAL | 0 refills | Status: DC

## 2022-07-25 NOTE — Telephone Encounter (Signed)
Chart reviewed. Pomalyst refilled per last office note with Dr. Katragadda.  

## 2022-08-01 ENCOUNTER — Encounter: Payer: Self-pay | Admitting: Hematology

## 2022-08-07 ENCOUNTER — Other Ambulatory Visit: Payer: Self-pay

## 2022-08-07 DIAGNOSIS — G8929 Other chronic pain: Secondary | ICD-10-CM

## 2022-08-07 MED ORDER — OXYCODONE-ACETAMINOPHEN 5-325 MG PO TABS
ORAL_TABLET | ORAL | 0 refills | Status: DC
Start: 2022-08-07 — End: 2022-09-10

## 2022-08-16 ENCOUNTER — Ambulatory Visit (INDEPENDENT_AMBULATORY_CARE_PROVIDER_SITE_OTHER): Payer: 59 | Admitting: Adult Health

## 2022-08-16 ENCOUNTER — Encounter: Payer: Self-pay | Admitting: Adult Health

## 2022-08-16 VITALS — BP 122/72 | HR 76 | Ht 62.0 in | Wt 296.2 lb

## 2022-08-16 DIAGNOSIS — N898 Other specified noninflammatory disorders of vagina: Secondary | ICD-10-CM | POA: Insufficient documentation

## 2022-08-16 DIAGNOSIS — N823 Fistula of vagina to large intestine: Secondary | ICD-10-CM | POA: Diagnosis not present

## 2022-08-16 DIAGNOSIS — Z90710 Acquired absence of both cervix and uterus: Secondary | ICD-10-CM

## 2022-08-16 DIAGNOSIS — Z90721 Acquired absence of ovaries, unilateral: Secondary | ICD-10-CM | POA: Diagnosis not present

## 2022-08-16 NOTE — Progress Notes (Signed)
  Subjective:     Patient ID: Kelli Hurley, female   DOB: 1955-08-24, 67 y.o.   MRN: [id number]  HPI Rogena is a 67 year old black female,single, sp hysterectomy on complaining of vaginal discharge for months. Denies any itching or burning, was treated with cream by PCP. She is in wheelchair today. She has multiple myeloma sees Dr Ellin Saba, and sees Dr Wolfgang Phoenix for kidneys.  PCP is [facility]  Review of Systems +vaginal discharge  Denies any itching or burning Not having sex Reviewed past medical,surgical, social and family history. Reviewed medications and allergies.     Objective:   Physical Exam BP 122/72 (BP Location: Left Arm, Patient Position: Sitting, Cuff Size: Normal)   Pulse 76   Ht 5\' 2"  (1.575 m)   Wt 296 lb 3.2 oz (134.4 kg)   BMI 54.18 kg/m  Skin warm and dry. Lungs: clear to ausculation bilaterally. Cardiovascular: regular rate and rhythm.    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 Bladder is non tender and no masses felt. She does notice more after BM and sitting long time.  AA is 0 Fall risk is moderate    08/16/2022   11:20 AM 05/31/2020   12:28 PM 02/13/2017    5:20 PM  Depression screen PHQ 2/9  Decreased Interest 0 0 0  Down, Depressed, Hopeless 0 0 0  PHQ - 2 Score 0 0 0  Altered sleeping 0    Tired, decreased energy 0    Change in appetite 0    Feeling bad or failure about yourself  0    Trouble concentrating 0    Moving slowly or fidgety/restless 0    Suicidal thoughts 0    PHQ-9 Score 0         08/16/2022   11:21 AM  GAD 7 : Generalized Anxiety Score  Nervous, Anxious, on Edge 0  Control/stop worrying 0  Worry too much - different things 0  Trouble relaxing 0  Restless 0  Easily annoyed or irritable 0   Afraid - awful might happen 0  Total GAD 7 Score 0      Upstream - 08/16/22 1136       Pregnancy Intention Screening   Does the patient want to become pregnant in the next year? N/A    Does the patient's partner want to become pregnant in the next year? N/A    Would the patient like to discuss contraceptive options today? N/A      Contraception Wrap Up   Current Method Female Sterilization   hyst   End Method Female Sterilization   hyst   Contraception Counseling Provided No            Examination chaperoned by Malachy Mood LPN  Assessment:     1. Vaginal discharge +brown and grainy, smells like fecal material She said she thought it was stool Keep vaginal area clean and wear a pad and change often   2. 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

## 2022-08-23 ENCOUNTER — Other Ambulatory Visit: Payer: Self-pay

## 2022-08-23 MED ORDER — POMALIDOMIDE 4 MG PO CAPS: 4.0000 mg | ORAL_CAPSULE | Freq: Every day | ORAL | 0 refills | Status: DC

## 2022-08-23 NOTE — Telephone Encounter (Signed)
 Chart reviewed. Pomalyst refilled per last office note with Dr. Katragadda.  

## 2022-08-31 ENCOUNTER — Other Ambulatory Visit: Payer: Self-pay | Admitting: Pharmacist

## 2022-08-31 MED ORDER — OZEMPIC (2 MG/DOSE) 8 MG/3ML ~~LOC~~ SOPN: 2.0000 mg | PEN_INJECTOR | SUBCUTANEOUS | 11 refills | Status: DC

## 2022-09-03 ENCOUNTER — Other Ambulatory Visit: Payer: Self-pay

## 2022-09-03 ENCOUNTER — Encounter (HOSPITAL_COMMUNITY): Payer: Self-pay | Admitting: Emergency Medicine

## 2022-09-03 ENCOUNTER — Emergency Department (HOSPITAL_COMMUNITY): Payer: 59

## 2022-09-03 ENCOUNTER — Inpatient Hospital Stay (HOSPITAL_COMMUNITY)
Admission: EM | Admit: 2022-09-03 | Discharge: 2022-09-10 | DRG: 871 | Disposition: A | Discharge status: Home Health | Payer: 59 | Attending: Internal Medicine | Admitting: Internal Medicine

## 2022-09-03 DIAGNOSIS — I129 Hypertensive chronic kidney disease with stage 1 through stage 4 chronic kidney disease, or unspecified chronic kidney disease: Secondary | ICD-10-CM | POA: Diagnosis present

## 2022-09-03 DIAGNOSIS — E1165 Type 2 diabetes mellitus with hyperglycemia: Secondary | ICD-10-CM | POA: Diagnosis present

## 2022-09-03 DIAGNOSIS — N136 Pyonephrosis: Secondary | ICD-10-CM | POA: Diagnosis present

## 2022-09-03 DIAGNOSIS — R6521 Severe sepsis with septic shock: Secondary | ICD-10-CM | POA: Diagnosis not present

## 2022-09-03 DIAGNOSIS — N189 Chronic kidney disease, unspecified: Secondary | ICD-10-CM | POA: Diagnosis not present

## 2022-09-03 DIAGNOSIS — D631 Anemia in chronic kidney disease: Secondary | ICD-10-CM | POA: Diagnosis not present

## 2022-09-03 DIAGNOSIS — K651 Peritoneal abscess: Secondary | ICD-10-CM | POA: Diagnosis not present

## 2022-09-03 DIAGNOSIS — E871 Hypo-osmolality and hyponatremia: Secondary | ICD-10-CM | POA: Diagnosis present

## 2022-09-03 DIAGNOSIS — G4733 Obstructive sleep apnea (adult) (pediatric): Secondary | ICD-10-CM | POA: Diagnosis present

## 2022-09-03 DIAGNOSIS — N828 Other female genital tract fistulae: Secondary | ICD-10-CM | POA: Diagnosis present

## 2022-09-03 DIAGNOSIS — E441 Mild protein-calorie malnutrition: Secondary | ICD-10-CM | POA: Diagnosis not present

## 2022-09-03 DIAGNOSIS — N823 Fistula of vagina to large intestine: Secondary | ICD-10-CM | POA: Diagnosis present

## 2022-09-03 DIAGNOSIS — C9001 Multiple myeloma in remission: Secondary | ICD-10-CM | POA: Diagnosis present

## 2022-09-03 DIAGNOSIS — Z1152 Encounter for screening for COVID-19: Secondary | ICD-10-CM

## 2022-09-03 DIAGNOSIS — R7401 Elevation of levels of liver transaminase levels: Secondary | ICD-10-CM | POA: Diagnosis present

## 2022-09-03 DIAGNOSIS — Z8249 Family history of ischemic heart disease and other diseases of the circulatory system: Secondary | ICD-10-CM

## 2022-09-03 DIAGNOSIS — A419 Sepsis, unspecified organism: Secondary | ICD-10-CM | POA: Diagnosis not present

## 2022-09-03 DIAGNOSIS — I4892 Unspecified atrial flutter: Secondary | ICD-10-CM | POA: Diagnosis not present

## 2022-09-03 DIAGNOSIS — E1122 Type 2 diabetes mellitus with diabetic chronic kidney disease: Secondary | ICD-10-CM | POA: Diagnosis present

## 2022-09-03 DIAGNOSIS — I10 Essential (primary) hypertension: Secondary | ICD-10-CM | POA: Diagnosis present

## 2022-09-03 DIAGNOSIS — J189 Pneumonia, unspecified organism: Secondary | ICD-10-CM | POA: Diagnosis present

## 2022-09-03 DIAGNOSIS — F40240 Claustrophobia: Secondary | ICD-10-CM | POA: Diagnosis present

## 2022-09-03 DIAGNOSIS — Z8042 Family history of malignant neoplasm of prostate: Secondary | ICD-10-CM

## 2022-09-03 DIAGNOSIS — D630 Anemia in neoplastic disease: Secondary | ICD-10-CM | POA: Diagnosis present

## 2022-09-03 DIAGNOSIS — E8809 Other disorders of plasma-protein metabolism, not elsewhere classified: Secondary | ICD-10-CM | POA: Insufficient documentation

## 2022-09-03 DIAGNOSIS — E782 Mixed hyperlipidemia: Secondary | ICD-10-CM | POA: Diagnosis not present

## 2022-09-03 DIAGNOSIS — E876 Hypokalemia: Secondary | ICD-10-CM | POA: Diagnosis not present

## 2022-09-03 DIAGNOSIS — C9000 Multiple myeloma not having achieved remission: Secondary | ICD-10-CM | POA: Diagnosis present

## 2022-09-03 DIAGNOSIS — E872 Acidosis, unspecified: Secondary | ICD-10-CM | POA: Diagnosis present

## 2022-09-03 DIAGNOSIS — E860 Dehydration: Secondary | ICD-10-CM | POA: Diagnosis present

## 2022-09-03 DIAGNOSIS — Z888 Allergy status to other drugs, medicaments and biological substances status: Secondary | ICD-10-CM

## 2022-09-03 DIAGNOSIS — N179 Acute kidney failure, unspecified: Secondary | ICD-10-CM | POA: Diagnosis not present

## 2022-09-03 DIAGNOSIS — Z6841 Body Mass Index (BMI) 40.0 and over, adult: Secondary | ICD-10-CM | POA: Diagnosis not present

## 2022-09-03 DIAGNOSIS — Z7982 Long term (current) use of aspirin: Secondary | ICD-10-CM

## 2022-09-03 DIAGNOSIS — N131 Hydronephrosis with ureteral stricture, not elsewhere classified: Secondary | ICD-10-CM | POA: Diagnosis not present

## 2022-09-03 DIAGNOSIS — Z5986 Financial insecurity: Secondary | ICD-10-CM

## 2022-09-03 DIAGNOSIS — N824 Other female intestinal-genital tract fistulae: Secondary | ICD-10-CM

## 2022-09-03 DIAGNOSIS — W19XXXA Unspecified fall, initial encounter: Secondary | ICD-10-CM | POA: Diagnosis present

## 2022-09-03 DIAGNOSIS — Z79899 Other long term (current) drug therapy: Secondary | ICD-10-CM

## 2022-09-03 DIAGNOSIS — G8929 Other chronic pain: Secondary | ICD-10-CM

## 2022-09-03 DIAGNOSIS — R9431 Abnormal electrocardiogram [ECG] [EKG]: Secondary | ICD-10-CM | POA: Diagnosis present

## 2022-09-03 DIAGNOSIS — N1832 Chronic kidney disease, stage 3b: Secondary | ICD-10-CM | POA: Diagnosis present

## 2022-09-03 DIAGNOSIS — Z90710 Acquired absence of both cervix and uterus: Secondary | ICD-10-CM

## 2022-09-03 DIAGNOSIS — Z7985 Long-term (current) use of injectable non-insulin antidiabetic drugs: Secondary | ICD-10-CM

## 2022-09-03 DIAGNOSIS — E46 Unspecified protein-calorie malnutrition: Secondary | ICD-10-CM | POA: Insufficient documentation

## 2022-09-03 DIAGNOSIS — E878 Other disorders of electrolyte and fluid balance, not elsewhere classified: Secondary | ICD-10-CM | POA: Diagnosis present

## 2022-09-03 DIAGNOSIS — R310 Gross hematuria: Secondary | ICD-10-CM | POA: Diagnosis not present

## 2022-09-03 DIAGNOSIS — Z7901 Long term (current) use of anticoagulants: Secondary | ICD-10-CM

## 2022-09-03 LAB — CULTURE, BLOOD (ROUTINE X 2)

## 2022-09-03 LAB — COMPREHENSIVE METABOLIC PANEL
ALT: 18 U/L (ref 0–44)
AST: 54 U/L — ABNORMAL HIGH (ref 15–41)
Albumin: 3.4 g/dL — ABNORMAL LOW (ref 3.5–5.0)
Alkaline Phosphatase: 43 U/L (ref 38–126)
Anion gap: 11 (ref 5–15)
BUN: 52 mg/dL — ABNORMAL HIGH (ref 8–23)
CO2: 25 mmol/L (ref 22–32)
Calcium: 8.5 mg/dL — ABNORMAL LOW (ref 8.9–10.3)
Chloride: 92 mmol/L — ABNORMAL LOW (ref 98–111)
Creatinine, Ser: 3.34 mg/dL — ABNORMAL HIGH (ref 0.44–1.00)
GFR, Estimated: 15 mL/min — ABNORMAL LOW (ref >60)
Glucose, Bld: 145 mg/dL — ABNORMAL HIGH (ref 70–99)
Potassium: 3.4 mmol/L — ABNORMAL LOW (ref 3.5–5.1)
Sodium: 128 mmol/L — ABNORMAL LOW (ref 135–145)
Total Bilirubin: 1.3 mg/dL — ABNORMAL HIGH (ref 0.3–1.2)
Total Protein: 6.6 g/dL (ref 6.5–8.1)

## 2022-09-03 LAB — CBC WITH DIFFERENTIAL/PLATELET
Abs Immature Granulocytes: 0.2 10*3/uL — ABNORMAL HIGH (ref 0.00–0.07)
Band Neutrophils: 5 %
Basophils Absolute: 0 10*3/uL (ref 0.0–0.1)
Basophils Relative: 0 %
Eosinophils Absolute: 0 10*3/uL (ref 0.0–0.5)
Eosinophils Relative: 0 %
HCT: 29.6 % — ABNORMAL LOW (ref 36.0–46.0)
Hemoglobin: 9.5 g/dL — ABNORMAL LOW (ref 12.0–15.0)
Lymphocytes Relative: 18 %
Lymphs Abs: 2.1 10*3/uL (ref 0.7–4.0)
MCH: 30.2 pg (ref 26.0–34.0)
MCHC: 32.1 g/dL (ref 30.0–36.0)
MCV: 94 fL (ref 80.0–100.0)
Metamyelocytes Relative: 2 %
Monocytes Absolute: 1.4 10*3/uL — ABNORMAL HIGH (ref 0.1–1.0)
Monocytes Relative: 12 %
Neutro Abs: 7.8 10*3/uL — ABNORMAL HIGH (ref 1.7–7.7)
Neutrophils Relative %: 63 %
Platelets: 217 10*3/uL (ref 150–400)
RBC: 3.15 MIL/uL — ABNORMAL LOW (ref 3.87–5.11)
RDW: 14.9 % (ref 11.5–15.5)
WBC: 11.5 10*3/uL — ABNORMAL HIGH (ref 4.0–10.5)
nRBC: 0.3 % — ABNORMAL HIGH (ref 0.0–0.2)

## 2022-09-03 LAB — APTT: aPTT: 24 seconds (ref 24–36)

## 2022-09-03 LAB — PROTIME-INR
INR: 1.2 (ref 0.8–1.2)
Prothrombin Time: 15.5 seconds — ABNORMAL HIGH (ref 11.4–15.2)

## 2022-09-03 LAB — LACTIC ACID, PLASMA
Lactic Acid, Venous: 2.5 mmol/L (ref 0.5–1.9)
Lactic Acid, Venous: 1.4 mmol/L (ref 0.5–1.9)

## 2022-09-03 LAB — RESP PANEL BY RT-PCR (RSV, FLU A&B, COVID)  RVPGX2
Influenza A by PCR: NEGATIVE
Influenza B by PCR: NEGATIVE
Resp Syncytial Virus by PCR: NEGATIVE
SARS Coronavirus 2 by RT PCR: NEGATIVE

## 2022-09-03 LAB — CBG MONITORING, ED: Glucose-Capillary: 126 mg/dL — ABNORMAL HIGH (ref 70–99)

## 2022-09-03 MED ORDER — LACTATED RINGERS IV BOLUS
2000.0000 mL | Freq: Once | INTRAVENOUS | Status: AC
  Administered 2022-09-03: 2000 mL via INTRAVENOUS

## 2022-09-03 MED ORDER — HEPARIN SODIUM (PORCINE) 5000 UNIT/ML IJ SOLN
5000.0000 [IU] | Freq: Three times a day (TID) | INTRAMUSCULAR | Status: DC
  Administered 2022-09-04 (×3): 5000 [IU] via SUBCUTANEOUS
  Filled 2022-09-03 (×3): qty 1

## 2022-09-03 MED ORDER — CHLORHEXIDINE GLUCONATE CLOTH 2 % EX PADS
6.0000 | MEDICATED_PAD | Freq: Every day | CUTANEOUS | Status: DC
  Administered 2022-09-04 – 2022-09-08 (×8): 6 via TOPICAL

## 2022-09-03 MED ORDER — SODIUM CHLORIDE 0.9 % IV SOLN
500.0000 mg | INTRAVENOUS | Status: DC
  Administered 2022-09-03 – 2022-09-05 (×3): 500 mg via INTRAVENOUS
  Filled 2022-09-03 (×3): qty 5

## 2022-09-03 MED ORDER — DM-GUAIFENESIN ER 30-600 MG PO TB12
1.0000 | ORAL_TABLET | Freq: Two times a day (BID) | ORAL | Status: DC
  Administered 2022-09-04 – 2022-09-10 (×12): 1 via ORAL
  Filled 2022-09-03 (×13): qty 1

## 2022-09-03 MED ORDER — ACETAMINOPHEN 500 MG PO TABS
1000.0000 mg | ORAL_TABLET | Freq: Once | ORAL | Status: AC
  Administered 2022-09-03: 1000 mg via ORAL
  Filled 2022-09-03: qty 2

## 2022-09-03 MED ORDER — POTASSIUM CHLORIDE CRYS ER 20 MEQ PO TBCR
20.0000 meq | EXTENDED_RELEASE_TABLET | Freq: Once | ORAL | Status: AC
  Administered 2022-09-04: 20 meq via ORAL
  Filled 2022-09-03: qty 1

## 2022-09-03 MED ORDER — SODIUM CHLORIDE 0.9 % IV BOLUS
1000.0000 mL | Freq: Once | INTRAVENOUS | Status: AC
  Administered 2022-09-03: 1000 mL via INTRAVENOUS

## 2022-09-03 MED ORDER — NOREPINEPHRINE 4 MG/250ML-% IV SOLN
0.0000 ug/min | INTRAVENOUS | Status: DC
  Administered 2022-09-03: 2 ug/min via INTRAVENOUS
  Administered 2022-09-05: 3 ug/min via INTRAVENOUS
  Filled 2022-09-03 (×2): qty 250

## 2022-09-03 MED ORDER — ONDANSETRON HCL 4 MG PO TABS: 4.0000 mg | ORAL_TABLET | Freq: Four times a day (QID) | ORAL | Status: DC | PRN

## 2022-09-03 MED ORDER — ENSURE ENLIVE PO LIQD
237.0000 mL | Freq: Two times a day (BID) | ORAL | Status: DC
  Administered 2022-09-10: 237 mL via ORAL
  Filled 2022-09-03 (×2): qty 237

## 2022-09-03 MED ORDER — LACTATED RINGERS IV SOLN: INTRAVENOUS | Status: DC

## 2022-09-03 MED ORDER — ACETAMINOPHEN 650 MG RE SUPP: 650.0000 mg | Freq: Four times a day (QID) | RECTAL | Status: DC | PRN

## 2022-09-03 MED ORDER — ACETAMINOPHEN 325 MG PO TABS
650.0000 mg | ORAL_TABLET | Freq: Four times a day (QID) | ORAL | Status: DC | PRN
  Administered 2022-09-04 – 2022-09-08 (×4): 650 mg via ORAL
  Filled 2022-09-03 (×4): qty 2

## 2022-09-03 MED ORDER — SODIUM CHLORIDE 0.9 % IV SOLN
2.0000 g | INTRAVENOUS | Status: AC
  Administered 2022-09-03 – 2022-09-09 (×7): 2 g via INTRAVENOUS
  Filled 2022-09-03 (×7): qty 20

## 2022-09-03 MED ORDER — ONDANSETRON HCL 4 MG/2ML IJ SOLN: 4.0000 mg | Freq: Four times a day (QID) | INTRAMUSCULAR | Status: DC | PRN

## 2022-09-03 NOTE — ED Triage Notes (Signed)
Pt BIB [facility] from home for generalized weakness that began this morning but has increased throughout the day.

## 2022-09-03 NOTE — ED Notes (Addendum)
Attempted to transfer pt to bedside commode, but was unable to stand due to hypotensive BP when sitting. 85/45. MD notified.

## 2022-09-03 NOTE — H&P (Signed)
History and Physical    Patient: Kelli Hurley ZOX:[id number] DOB: 1955-12-21 DOA: 09/03/2022 DOS: the patient was seen and examined on 09/03/2022 PCP: [facility]  Patient coming from: Home  Chief Complaint:  Chief Complaint  Patient presents with   Weakness   HPI: Kelli Hurley is a 67 y.o. female with medical history significant of hypertension, hyperlipidemia, multiple myeloma in remission with no evidence of cardiac amyloidosis per cardiac MRI in 2021, OSA on CPAP, CKD stage III who presents to the emergency department from home via EMS due to generalized weakness which started today and has progressively worsened throughout the day.  She endorsed 2 episodes of fall today which was described as sliding to the ground while walking due to weakness, she denies any injury during the falls.  Patient states that she wanted to get discharged today, but was unable to due to the worsening generalized weakness.  She denies chest pain, shortness of breath, cough, diarrhea or abdominal pain.  EMS was activated and patient was sent to the ED for further evaluation and management.  ED Course:  In the emergency department, she was febrile with a temperature of 102.1F, HR 30/min, pulse 112 bpm, BP 86/50, O2 sat 96% on room air.  Workup in ED showed WBC 11.5, hemoglobin 9.5, hematocrit 29.6, MCV 94.0, platelets 217 BMP sodium 128, potassium 3.4, chloride 93, bicarb 25, glucose 145, BUN 52, creatinine 3.34 (baseline creatinine at 1.8-2.3), albumin 3.4 Chest x-ray showed mild airspace disease at the left lung base, possible atelectasis or infiltrate Patient was empirically started on IV ceftriaxone and azithromycin, Tylenol was given, IV hydration per sepsis protocol was provided without response (BP continue to be within hypotensive range).  She was then started on IV Levophed.  Hospitalist was asked to admit patient for further evaluation and management.  Review of  Systems: Review of systems as noted in the HPI. All other systems reviewed and are negative.   Past Medical History:  Diagnosis Date   Anemia    Arthritis    Cancer (HCC)    Claustrophobia 10/05/2014   Hypertension    Leukopenia 08/03/2014   Normocytic hypochromic anemia 08/03/2014   Renal disorder    stage 3    Past Surgical History:  Procedure Laterality Date   ABDOMINAL HYSTERECTOMY     CARDIAC SURGERY     93 months old. States she had a leaky valve.    COLONOSCOPY  03/26/2009   Dr. Darrick Penna; normal colon, small internal hemorrhoids.  Recommended repeat colonoscopy in 10 years.   COLONOSCOPY WITH PROPOFOL N/A 11/25/2019   Procedure: COLONOSCOPY WITH PROPOFOL;  Surgeon: Lanelle Bal, DO;  Location: [facility];  Service: Endoscopy;  Laterality: N/A;  10:45am   OTHER SURGICAL HISTORY     heart surgery as infant to "repair hole in heart"   PORTACATH PLACEMENT Left 02/21/2019   Procedure: INSERTION PORT-A-CATH;  Surgeon: Lucretia Roers, MD;  Location: [facility] ORS;  Service: General;  Laterality: Left;    Social History:  reports that she has never smoked. She has never used smokeless tobacco. She reports that she does not drink alcohol and does not use drugs.   Allergies  Allergen Reactions   Diclofenac Swelling    Per pt, facial swelling  Other Reaction(s): Other (See Comments)  Per pt, facial swelling, Per pt, facial swelling    Family History  Problem Relation Age of Onset   Cancer Maternal Grandmother    Cancer  Father    Hypertension Father    Cancer Mother    Hypertension Mother    Hypertension Brother    Prostate cancer Brother    Hypertension Brother    Hypertension Brother    Hypertension Sister    Hypertension Sister    Hypertension Sister    Hypertension Sister    Colon cancer Neg Hx    Colon polyps Neg Hx      Prior to Admission medications   Medication Sig Start Date End Date Taking? Authorizing Provider  Semaglutide, 2 MG/DOSE, (OZEMPIC, 2  MG/DOSE,) 8 MG/3ML SOPN Inject 2 mg into the skin once a week. 08/31/22   Meriam Sprague, MD  acetaminophen (TYLENOL) 650 MG CR tablet Take 650 mg by mouth every 8 (eight) hours as needed for pain.    [provider]  albuterol (VENTOLIN HFA) 108 (90 Base) MCG/ACT inhaler Inhale 2 puffs into the lungs every 6 (six) hours as needed for wheezing or shortness of breath. 02/18/20   Doreatha Massed, MD  ALPRAZolam Prudy Feeler) 1 MG tablet Take 1 mg by mouth daily as needed. 03/17/20   [provider]  aspirin EC 81 MG tablet Take 81 mg by mouth daily.    [provider]  calcitRIOL (ROCALTROL) 0.25 MCG capsule Take by mouth. 08/23/21   [provider]  dapagliflozin propanediol (FARXIGA) 10 MG TABS tablet Take 1 tablet (10 mg total) by mouth daily before breakfast. 02/08/22   Chandrasekhar, Mahesh A, MD  dexamethasone (DECADRON) 4 MG tablet TAKE FIVE TABLETS BY MOUTH ONCE A WEEK 06/26/22   Doreatha Massed, MD  fluticasone (FLONASE) 50 MCG/ACT nasal spray     [provider]  furosemide (LASIX) 20 MG tablet TAKE 2 TABLETS BY MOUTH DAILY IN THE MORNING AND TAKE 2 TABLETS AT BEDTIME Patient taking differently: Take 20 mg by mouth as directed. 2 tablet in the morning and 2 tablets at bedtime on Mon., Wed., and Friday. 1 tablet in the morning and 1 tablet at bedtime on Sun., Tues., Thurs., and Sat. 08/24/21   Christell Constant, MD  Infant Care Products [facility]) OINT Apply to affected area as needed Externally for 30 days 08/08/21   [provider]  lidocaine-prilocaine (EMLA) cream APPLY 1 QUARTER SIZED AMOUNT 1 HOUR PRIOR TO TREATMENT 04/10/22   Doreatha Massed, MD  losartan (COZAAR) 25 MG tablet Take 12.5 mg by mouth daily. 06/16/20   [provider]  magnesium oxide (MAG-OX) 400 (240 Mg) MG tablet TAKE 1 TABLET BY MOUTH TWICE DAILY 07/12/21   Doreatha Massed, MD  metolazone (ZAROXOLYN) 2.5 MG tablet Take 1 tablet (2.5 mg total) by  mouth daily. Patient taking differently: Take 2.5 mg by mouth 3 (three) times a week. Mon., Wed., and Friday 11/29/21   Doreatha Massed, MD  Multiple Vitamin (TAB-A-VITE) TABS TAKE 1 TABLET BY MOUTH ONCE DAILY. Patient taking differently: Take 1 tablet by mouth daily. 04/10/17   Hubbard Hartshorn, NP  oxyCODONE-acetaminophen (PERCOCET/ROXICET) 5-325 MG tablet TAKE 1 OR 2 TABLETS BY MOUTH EVERY TWELVE HOURS AS NEEDED for severe pain 08/07/22   Doreatha Massed, MD  pomalidomide (POMALYST) 4 MG capsule Take 1 capsule (4 mg total) by mouth daily. 21 days on, 7 days off 08/23/22   Doreatha Massed, MD  potassium chloride SA (KLOR-CON M) 20 MEQ tablet TAKE 1 TABLET BY MOUTH THREE TIMES DAILY Patient taking differently: Take 20 mEq by mouth 2 (two) times daily. TAKE 1 TABLET BY MOUTH THREE TIMES DAILY 04/26/22  Doreatha Massed, MD  pregabalin (LYRICA) 75 MG capsule Take 1 capsule (75 mg total) by mouth daily. 06/28/22   Doreatha Massed, MD  rosuvastatin (CRESTOR) 10 MG tablet Take 1 tablet (10 mg total) by mouth daily. 05/02/22   Christell Constant, MD  spironolactone (ALDACTONE) 25 MG tablet Take 12.5 mg by mouth daily. 06/14/21   [provider]  trolamine salicylate (ASPERCREME) 10 % cream Apply 1 application topically 2 (two) times daily as needed for muscle pain.    [provider]  VITAMIN D PO Take by mouth.    [provider]    Physical Exam: BP 115/66   Pulse 97   Temp 100 F (37.8 C) (Oral)   Resp (!) 22   Ht 5\' 2"  (1.575 m)   Wt 134.4 kg   SpO2 97%   BMI 54.18 kg/m   General: 67 y.o. year-old female well developed well nourished in no acute distress.  Alert and oriented x3. HEENT: NCAT, EOMI Neck: Supple, trachea medial Cardiovascular: Tachycardia.  Regular rate and rhythm with no rubs or gallops.  No thyromegaly or JVD noted.  No lower extremity edema. 2/4 pulses in all 4 extremities. Respiratory: Tachypnea.  Clear to auscultation  with no wheezes or rales. Good inspiratory effort. Abdomen: Soft, nontender nondistended with normal bowel sounds x4 quadrants. Muskuloskeletal: No cyanosis, clubbing or edema noted bilaterally Neuro: CN II-XII intact, strength 5/5 x 4, sensation, reflexes intact Skin: No ulcerative lesions noted or rashes Psychiatry: Judgement and insight appear normal. Mood is appropriate for condition and setting          Labs on Admission:  Basic Metabolic Panel: Recent Labs  Lab 09/03/22 2026  NA 128*  K 3.4*  CL 92*  CO2 25  GLUCOSE 145*  BUN 52*  CREATININE 3.34*  CALCIUM 8.5*   Liver Function Tests: Recent Labs  Lab 09/03/22 2026  AST 54*  ALT 18  ALKPHOS 43  BILITOT 1.3*  PROT 6.6  ALBUMIN 3.4*   No results for input(s): "LIPASE", "AMYLASE" in the last 168 hours. No results for input(s): "AMMONIA" in the last 168 hours. CBC: Recent Labs  Lab 09/03/22 2026  WBC 11.5*  NEUTROABS 7.8*  HGB 9.5*  HCT 29.6*  MCV 94.0  PLT 217   Cardiac Enzymes: No results for input(s): "CKTOTAL", "CKMB", "CKMBINDEX", "TROPONINI" in the last 168 hours.  BNP (last 3 results) No results for input(s): "BNP" in the last 8760 hours.  ProBNP (last 3 results) No results for input(s): "PROBNP" in the last 8760 hours.  CBG: Recent Labs  Lab 09/03/22 2022  GLUCAP 126*    Radiological Exams on Admission: DG Chest Port 1 View  Result Date: 09/03/2022 CLINICAL DATA:  Possible sepsis. EXAM: PORTABLE CHEST 1 VIEW COMPARISON:  08/06/2019. FINDINGS: Heart is enlarged and the mediastinal contour is within normal limits. Lung volumes are low with mild airspace disease at the left lung base. No definite effusion or pneumothorax. A left chest port is stable in position. No acute osseous abnormality. IMPRESSION: 1. Mild airspace disease at the left lung base, possible atelectasis or infiltrate. 2. Cardiomegaly. Electronically Signed   By: Thornell Sartorius M.D.   On: 09/03/2022 20:52    EKG: I  independently viewed the EKG done and my findings are as followed: Sinus tachycardia at rate of 113 bpm  Assessment/Plan Present on Admission:  Sepsis due to pneumonia (HCC)  Multiple myeloma (HCC)  OSA (obstructive sleep apnea)  Essential hypertension  Principal Problem:  Sepsis due to pneumonia [facility]) Active Problems:   Multiple myeloma (HCC)   Morbid obesity with BMI of 50.0-59.9, adult (HCC)   Essential hypertension   OSA (obstructive sleep apnea)   Lactic acidosis   Hyponatremia   Acute kidney injury superimposed on CKD (HCC)   Hypoalbuminemia due to protein-calorie malnutrition (HCC)   Hypokalemia   Mixed hyperlipidemia  Severe sepsis possibly due to CAP POA Chest x-ray was suggestive of LLL atelectasis or infiltrate Patient was started on ceftriaxone and azithromycin, we shall continue same at this time with plan to de-escalate/discontinue based on blood culture, sputum culture, urine Legionella, strep pneumo and procalcitonin Continue Tylenol as needed Continue Mucinex, incentive spirometry, flutter valve Continue IV hydration Continue IV Levophed with plan to wean patient off this as tolerated  Lactic acidosis possibly secondary to above-resolved Lactic acid 2.5 > 1.4  Hyponatremia possibly due to dehydration Na 128 Continue gentle hydration Continue to monitor sodium with serial BMPs Urine osmolality, serum osmolality and urine sodium will be checked  Hypokalemia K+ 3.4, this will be replenished  Acute kidney injury superimposed on CKD creatinine 3.34 (baseline creatinine at 1.8-2.3) Renally adjust medications, avoid nephrotoxic agents/dehydration/hypotension Patient follows with nephrologist (Dr. 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 Continue dexamethasone 20 mg weekly  She follows with Dr. Ellin Saba with last visit being on 06/28/2022   DVT prophylaxis: Heparin subcu  Advance Care Planning: Full code  Consults: None  Family Communication: Daughter at bedside (all questions answered to satisfaction)  Severity of Illness: The appropriate patient status for this patient is INPATIENT. Inpatient status is judged to be reasonable and necessary in order to provide the required intensity of service to ensure the patient's safety. The patient's presenting symptoms, physical exam findings, and initial radiographic and laboratory data in the context of their chronic comorbidities is felt to place them at high risk for further clinical deterioration. Furthermore, it is not anticipated that the patient will be medically stable for discharge from the hospital within 2 midnights of admission.   * I certify that at the point of admission it is my clinical judgment that the patient will require inpatient hospital care spanning beyond 2 midnights from the point of admission due to high intensity of service, high risk for further deterioration and high frequency of surveillance required.*  Author: Frankey Shown, DO 09/03/2022 11:55 PM  For on call review [email or web address].

## 2022-09-03 NOTE — Sepsis Progress Note (Signed)
Elink following code sepsis °

## 2022-09-03 NOTE — ED Provider Notes (Signed)
[place] [facility]ERGENCY DEPARTMENT AT [facility] Provider Note   CSN: [id number] Arrival date & time: 09/03/22  2009     History  Chief Complaint  Patient presents with   Weakness    Kelli Hurley is a 67 y.o. female.   Weakness  67 year old female history of diabetes on Farxiga and Ozempic, hypertension on losartan spironolactone, she also has a history of morbid obesity, she is followed by oncology for multiple myeloma which is currently in remission based on most recent notes, she also has a history of kidney failure and is followed by Dr. Wolfgang Phoenix with nephrology, she is not on dialysis.  The patient presents with generalized weakness which is worsened throughout the day, she has had a fall to the ground twice which she describes as sliding to the ground when she is walking because she is feeling weak.  She has not actually injured herself in either of these falls, she has no chest pain coughing or shortness of breath, she has no headache sore throat numbness or weakness, no dysuria diarrhea or rectal bleeding, no rashes or tick bites.  The patient denies any other exposures to sick people in fact she states that she has otherwise been well and yesterday was a normal day.  She wanted to go to church today but she could not go because of how she was feeling.  She arrives by paramedic transport noted to be febrile over 102 degrees    Home Medications Prior to Admission medications   Medication Sig Start Date End Date Taking? Authorizing Provider  Semaglutide, 2 MG/DOSE, (OZEMPIC, 2 MG/DOSE,) 8 MG/3ML SOPN Inject 2 mg into the skin once a week. 08/31/22   Meriam Sprague, MD  acetaminophen (TYLENOL) 650 MG CR tablet Take 650 mg by mouth every 8 (eight) hours as needed for pain.    [provider]  albuterol (VENTOLIN HFA) 108 (90 Base) MCG/ACT inhaler Inhale 2 puffs into the lungs every 6 (six) hours as needed for wheezing or shortness of breath. 02/18/20    Doreatha Massed, MD  ALPRAZolam Prudy Feeler) 1 MG tablet Take 1 mg by mouth daily as needed. 03/17/20   [provider]  aspirin EC 81 MG tablet Take 81 mg by mouth daily.    [provider]  calcitRIOL (ROCALTROL) 0.25 MCG capsule Take by mouth. 08/23/21   [provider]  dapagliflozin propanediol (FARXIGA) 10 MG TABS tablet Take 1 tablet (10 mg total) by mouth daily before breakfast. 02/08/22   Chandrasekhar, Mahesh A, MD  dexamethasone (DECADRON) 4 MG tablet TAKE FIVE TABLETS BY MOUTH ONCE A WEEK 06/26/22   Doreatha Massed, MD  fluticasone (FLONASE) 50 MCG/ACT nasal spray     [provider]  furosemide (LASIX) 20 MG tablet TAKE 2 TABLETS BY MOUTH DAILY IN THE MORNING AND TAKE 2 TABLETS AT BEDTIME Patient taking differently: Take 20 mg by mouth as directed. 2 tablet in the morning and 2 tablets at bedtime on Mon., Wed., and Friday. 1 tablet in the morning and 1 tablet at bedtime on Sun., Tues., Thurs., and Sat. 08/24/21   Christell Constant, MD  Infant Care Products [facility]) OINT Apply to affected area as needed Externally for 30 days 08/08/21   [provider]  lidocaine-prilocaine (EMLA) cream APPLY 1 QUARTER SIZED AMOUNT 1 HOUR PRIOR TO TREATMENT 04/10/22   Doreatha Massed, MD  losartan (COZAAR) 25 MG tablet Take 12.5 mg by mouth daily. 06/16/20   [provider]  magnesium  oxide (MAG-OX) 400 (240 Mg) MG tablet TAKE 1 TABLET BY MOUTH TWICE DAILY 07/12/21   Doreatha Massed, MD  metolazone (ZAROXOLYN) 2.5 MG tablet Take 1 tablet (2.5 mg total) by mouth daily. Patient taking differently: Take 2.5 mg by mouth 3 (three) times a week. Mon., Wed., and Friday 11/29/21   Doreatha Massed, MD  Multiple Vitamin (TAB-A-VITE) TABS TAKE 1 TABLET BY MOUTH ONCE DAILY. Patient taking differently: Take 1 tablet by mouth daily. 04/10/17   Hubbard Hartshorn, NP  oxyCODONE-acetaminophen (PERCOCET/ROXICET) 5-325 MG tablet TAKE 1 OR 2 TABLETS BY  MOUTH EVERY TWELVE HOURS AS NEEDED for severe pain 08/07/22   Doreatha Massed, MD  pomalidomide (POMALYST) 4 MG capsule Take 1 capsule (4 mg total) by mouth daily. 21 days on, 7 days off 08/23/22   Doreatha Massed, MD  potassium chloride SA (KLOR-CON M) 20 MEQ tablet TAKE 1 TABLET BY MOUTH THREE TIMES DAILY Patient taking differently: Take 20 mEq by mouth 2 (two) times daily. TAKE 1 TABLET BY MOUTH THREE TIMES DAILY 04/26/22   Doreatha Massed, MD  pregabalin (LYRICA) 75 MG capsule Take 1 capsule (75 mg total) by mouth daily. 06/28/22   Doreatha Massed, MD  rosuvastatin (CRESTOR) 10 MG tablet Take 1 tablet (10 mg total) by mouth daily. 05/02/22   Christell Constant, MD  spironolactone (ALDACTONE) 25 MG tablet Take 12.5 mg by mouth daily. 06/14/21   [provider]  trolamine salicylate (ASPERCREME) 10 % cream Apply 1 application topically 2 (two) times daily as needed for muscle pain.    [provider]  VITAMIN D PO Take by mouth.    [provider]      Allergies    Diclofenac    Review of Systems   Review of Systems  Neurological:  Positive for weakness.  All other systems reviewed and are negative.   Physical Exam Updated Vital Signs BP (!) 87/53   Pulse (!) 104   Temp (!) 102.4 F (39.1 C) (Oral)   Resp (!) 23   Ht 1.575 m (5\' 2" )   Wt 134.4 kg   SpO2 90%   BMI 54.18 kg/m  Physical Exam Vitals and nursing note reviewed.  Constitutional:      General: She is not in acute distress.    Appearance: She is well-developed.  HENT:     Head: Normocephalic and atraumatic.     Mouth/Throat:     Pharynx: No oropharyngeal exudate.  Eyes:     General: No scleral icterus.       Right eye: No discharge.        Left eye: No discharge.     Conjunctiva/sclera: Conjunctivae normal.     Pupils: Pupils are equal, round, and reactive to light.  Neck:     Thyroid: No thyromegaly.     Vascular: No JVD.  Cardiovascular:     Rate and Rhythm:  Regular rhythm. Tachycardia present.     Heart sounds: Normal heart sounds. No murmur heard.    No friction rub. No gallop.  Pulmonary:     Effort: Pulmonary effort is normal. No respiratory distress.     Breath sounds: Normal breath sounds. No wheezing or rales.  Abdominal:     General: Bowel sounds are normal. There is no distension.     Palpations: Abdomen is soft. There is no mass.     Tenderness: There is no abdominal tenderness.  Musculoskeletal:        General: No tenderness. Normal range  of motion.     Cervical back: Normal range of motion and neck supple.     Right lower leg: No edema.     Left lower leg: No edema.  Lymphadenopathy:     Cervical: No cervical adenopathy.  Skin:    General: Skin is warm and dry.     Findings: No erythema or rash.  Neurological:     Mental Status: She is alert.     Coordination: Coordination normal.  Psychiatric:        Behavior: Behavior normal.     ED Results / Procedures / Treatments   Labs (all labs ordered are listed, but only abnormal results are displayed) Labs Reviewed  LACTIC ACID, PLASMA - Abnormal; Notable for the following components:      Result Value   Lactic Acid, Venous 2.5 (*)    All other components within normal limits  COMPREHENSIVE METABOLIC PANEL - Abnormal; Notable for the following components:   Sodium 128 (*)    Potassium 3.4 (*)    Chloride 92 (*)    Glucose, Bld 145 (*)    BUN 52 (*)    Creatinine, Ser 3.34 (*)    Calcium 8.5 (*)    Albumin 3.4 (*)    AST 54 (*)    Total Bilirubin 1.3 (*)    GFR, Estimated 15 (*)    All other components within normal limits  CBC WITH DIFFERENTIAL/PLATELET - Abnormal; Notable for the following components:   WBC 11.5 (*)    RBC 3.15 (*)    Hemoglobin 9.5 (*)    HCT 29.6 (*)    nRBC 0.3 (*)    Neutro Abs 7.8 (*)    Monocytes Absolute 1.4 (*)    Abs Immature Granulocytes 0.20 (*)    All other components within normal limits  PROTIME-INR - Abnormal; Notable for  the following components:   Prothrombin Time 15.5 (*)    All other components within normal limits  CBG MONITORING, ED - Abnormal; Notable for the following components:   Glucose-Capillary 126 (*)    All other components within normal limits  RESP PANEL BY RT-PCR (RSV, FLU A&B, COVID)  RVPGX2  CULTURE, BLOOD (ROUTINE X 2)  CULTURE, BLOOD (ROUTINE X 2)  LACTIC ACID, PLASMA  APTT  URINALYSIS, W/ REFLEX TO CULTURE (INFECTION SUSPECTED)    EKG EKG Interpretation Date/Time:  Sunday September 03 2022 20:19:38 EDT Ventricular Rate:  113 PR Interval:  195 QRS Duration:  88 QT Interval:  330 QTC Calculation: 453 R Axis:   91  Text Interpretation: Sinus tachycardia Right axis deviation Borderline T abnormalities, anterior leads Confirmed by Eber Hong ([id number]) on 09/03/2022 8:26:27 PM  Radiology DG Chest Port 1 View  Result Date: 09/03/2022 CLINICAL DATA:  Possible sepsis. EXAM: PORTABLE CHEST 1 VIEW COMPARISON:  08/06/2019. FINDINGS: Heart is enlarged and the mediastinal contour is within normal limits. Lung volumes are low with mild airspace disease at the left lung base. No definite effusion or pneumothorax. A left chest port is stable in position. No acute osseous abnormality. IMPRESSION: 1. Mild airspace disease at the left lung base, possible atelectasis or infiltrate. 2. Cardiomegaly. Electronically Signed   By: Thornell Sartorius M.D.   On: 09/03/2022 20:52    Procedures .Critical Care  Performed by: Eber Hong, MD Authorized by: Eber Hong, MD   Critical care provider statement:    Critical care time (minutes):  45   Critical care time was exclusive of:  Separately billable procedures  and treating other patients and teaching time   Critical care was necessary to treat or prevent imminent or life-threatening deterioration of the following conditions:  Sepsis   Critical care was time spent personally by me on the following activities:  Development of treatment plan with patient or  surrogate, discussions with consultants, evaluation of patient's response to treatment, examination of patient, obtaining history from patient or surrogate, review of old charts, re-evaluation of patient's condition, pulse oximetry, ordering and review of radiographic studies, ordering and review of laboratory studies and ordering and performing treatments and interventions   I assumed direction of critical care for this patient from another provider in my specialty: no     Care discussed with: admitting provider   Comments:           Medications Ordered in ED Medications  lactated ringers infusion ( Intravenous New Bag/Given 09/03/22 2129)  cefTRIAXone (ROCEPHIN) 2 g in sodium chloride 0.9 % 100 mL IVPB (0 g Intravenous Stopped 09/03/22 2148)  azithromycin (ZITHROMAX) 500 mg in sodium chloride 0.9 % 250 mL IVPB (0 mg Intravenous Stopped 09/03/22 2229)  norepinephrine (LEVOPHED) 4mg  in (0.016 mg/mL) premix infusion (2 mcg/min Intravenous New Bag/Given 09/03/22 2255)  acetaminophen (TYLENOL) tablet 1,000 mg (1,000 mg Oral Given 09/03/22 2129)  lactated ringers bolus 2,000 mL (2,000 mLs Intravenous New Bag/Given 09/03/22 2157)  sodium chloride 0.9 % bolus 1,000 mL (1,000 mLs Intravenous New Bag/Given 09/03/22 2231)    ED Course/ Medical Decision Making/ A&P                             Medical Decision Making Amount and/or Complexity of Data Reviewed Labs: ordered. Radiology: ordered. ECG/medicine tests: ordered.  Risk OTC drugs. Prescription drug management. Decision regarding hospitalization.    This patient presents to the ED for concern of fever and weakness, this involves an extensive number of treatment options, and is a complaint that carries with it a high risk of complications and morbidity.  The differential diagnosis includes sepsis, due to tachycardia and fever she meets criteria, I do not have a source yet.  She has no other symptoms to guide this.  I do not see any  rashes or signs of cellulitis.  She has no heart murmurs to suggest that this is endocarditis, she has no upper respiratory symptoms either.  She will need check for COVID, pneumonia, UTI, blood cultures and lactic acid   Co morbidities that complicate the patient evaluation  Kidney failure Obesity Multiple myeloma   Additional history obtained:  Additional history obtained from electronic medical record External records from outside source obtained and reviewed including oncology notes   Lab Tests:  I Ordered, and personally interpreted labs.  The pertinent results include: Initial lactic acid of 2.5, improved to 1.4 with IV fluids.  Sodium of 128, renal failure with creatinine of 3.34, baseline of 2 approximately 2 months ago, LFTs unremarkable, leukocytosis of 11,500 with anemia of 9.5, seems close to baseline.  COVID-negative, flu negative, normoglycemic   Imaging Studies ordered:  I ordered imaging studies including chest x-ray I independently visualized and interpreted imaging which showed airspace opacity left lung base I agree with the radiologist interpretation   Cardiac Monitoring: / EKG:  The patient was maintained on a cardiac monitor.  I personally viewed and interpreted the cardiac monitored which showed an underlying rhythm of: Sinus tachycardia   Consultations Obtained:  I requested consultation with the hospitalist  Dr. Thomes Dinning,  and discussed lab and imaging findings as well as pertinent plan - they recommend: Admission to the hospital to higher level of care   Problem List / ED Course / Critical interventions / Medication management  The patient presented with some low blood pressure, fever and progressive weakness, found to have an elevated lactic acid over 2, leukocytosis, evidence of mild pneumonia but also hardly any urine in the bladder at all.  The patient has not been able to urinate despite 3 L, Levophed added, antibiotics given I ordered medication  including Levophed Rocephin, IV fluids for severe sepsis Reevaluation of the patient after these medicines showed that the patient improved but still critically ill I have reviewed the patients home medicines and have made adjustments as needed   Social Determinants of Health:  Prior cancer   Test / Admission - Considered:  Admit to higher level of care         Final Clinical Impression(s) / ED Diagnoses Final diagnoses:  Sepsis, due to unspecified organism, unspecified whether acute organ dysfunction present [facility])     Eber Hong, MD 09/03/22 2301

## 2022-09-04 ENCOUNTER — Inpatient Hospital Stay (HOSPITAL_COMMUNITY): Payer: 59

## 2022-09-04 DIAGNOSIS — A419 Sepsis, unspecified organism: Secondary | ICD-10-CM | POA: Diagnosis not present

## 2022-09-04 DIAGNOSIS — J189 Pneumonia, unspecified organism: Secondary | ICD-10-CM | POA: Diagnosis not present

## 2022-09-04 LAB — GLUCOSE, CAPILLARY
Glucose-Capillary: 124 mg/dL — ABNORMAL HIGH (ref 70–99)
Glucose-Capillary: 119 mg/dL — ABNORMAL HIGH (ref 70–99)
Glucose-Capillary: 105 mg/dL — ABNORMAL HIGH (ref 70–99)
Glucose-Capillary: 108 mg/dL — ABNORMAL HIGH (ref 70–99)

## 2022-09-04 LAB — OSMOLALITY, URINE: Osmolality, Ur: 189 mOsm/kg — ABNORMAL LOW (ref 300–900)

## 2022-09-04 LAB — MRSA NEXT GEN BY PCR, NASAL: MRSA by PCR Next Gen: NOT DETECTED

## 2022-09-04 LAB — SODIUM, URINE, RANDOM: Sodium, Ur: 22 mmol/L

## 2022-09-04 LAB — HIV ANTIBODY (ROUTINE TESTING W REFLEX): HIV Screen 4th Generation wRfx: NONREACTIVE

## 2022-09-04 MED ORDER — POTASSIUM CHLORIDE CRYS ER 20 MEQ PO TBCR
40.0000 meq | EXTENDED_RELEASE_TABLET | ORAL | Status: AC
  Administered 2022-09-04 (×2): 40 meq via ORAL
  Filled 2022-09-04 (×2): qty 2

## 2022-09-04 MED ORDER — INSULIN ASPART 100 UNIT/ML IJ SOLN
0.0000 [IU] | Freq: Three times a day (TID) | INTRAMUSCULAR | Status: DC
  Administered 2022-09-04 – 2022-09-05 (×2): 1 [IU] via SUBCUTANEOUS

## 2022-09-04 MED ORDER — SODIUM CHLORIDE 0.9 % IV SOLN: INTRAVENOUS | Status: DC

## 2022-09-04 MED ORDER — ORAL CARE MOUTH RINSE: 15.0000 mL | OROMUCOSAL | Status: DC | PRN

## 2022-09-04 NOTE — Evaluation (Signed)
Physical Therapy Evaluation Patient Details Name: Kelli Hurley MRN: [id number] DOB: April 03, 1955 Today's Date: 09/04/2022  History of Present Illness  Kelli Hurley is a 67 y.o. female with medical history significant of hypertension, hyperlipidemia, multiple myeloma in remission with no evidence of cardiac amyloidosis per cardiac MRI in 2021, OSA on CPAP, CKD stage III who presents to the emergency department from home via EMS due to generalized weakness which started today and has progressively worsened throughout the day.  She endorsed 2 episodes of fall today which was described as sliding to the ground while walking due to weakness, she denies any injury during the falls.  Patient states that she wanted to get discharged today, but was unable to due to the worsening generalized weakness.  She denies chest pain, shortness of breath, cough, diarrhea or abdominal pain.  EMS was activated and patient was sent to the ED for further evaluation and management.   Clinical Impression  Pt tolerated treatment done today but activity and ambulation was limited due to LE weakness and fatigue. Pt reported feeling very weak and required mod/max assist to transfer from bed to chair using a RW. Pt presented on room air. Patient will benefit from continued skilled physical therapy in hospital and recommended venue below to increase strength, balance, endurance for safe ADLs and gait.       If plan is discharge home, recommend the following: A lot of help with walking and/or transfers;A little help with bathing/dressing/bathroom;Assistance with cooking/housework;Assist for transportation   Can travel by private vehicle        Equipment Recommendations    Recommendations for Other Services       Functional Status Assessment Patient has had a recent decline in their functional status and demonstrates the ability to make significant improvements in function in a reasonable and predictable amount of  time.     Precautions / Restrictions Precautions Precautions: Fall Restrictions Weight Bearing Restrictions: No      Mobility  Bed Mobility Overal bed mobility: Needs Assistance Bed Mobility: Sidelying to Sit   Sidelying to sit: Min assist       General bed mobility comments: Required help from therapist to sit up    Transfers Overall transfer level: Needs assistance Equipment used: Rolling walker (2 wheels) Transfers: Sit to/from Stand, Bed to chair/wheelchair/BSC Sit to Stand: Mod assist   Step pivot transfers: Mod assist, Max assist       General transfer comment: LE very weak, labored and reliant on RW    Ambulation/Gait Ambulation/Gait assistance: Mod assist, Max assist Gait Distance (Feet): 3 Feet Assistive device: Rolling walker (2 wheels) Gait Pattern/deviations: Step-to pattern, Decreased step length - right, Decreased step length - left, Decreased stride length Gait velocity: slow     General Gait Details: LE very weak, able to side step with mod assist and RW to chair  Stairs            Wheelchair Mobility     Tilt Bed    Modified Rankin (Stroke Patients Only)       Balance Overall balance assessment: Needs assistance Sitting-balance support: Single extremity supported, Feet supported Sitting balance-Leahy Scale: Fair Sitting balance - Comments: fair balance seated EOB   Standing balance support: Bilateral upper extremity supported, Reliant on assistive device for balance Standing balance-Leahy Scale: Poor Standing balance comment: poor standing balance using RW, unable to stand all the way up  Pertinent Vitals/Pain Pain Assessment Pain Assessment: No/denies pain    Home Living Family/patient expects to be discharged to:: Private residence Living Arrangements: Alone Available Help at Discharge: Family;Available 24 hours/day Type of Home: House Home Access: Stairs to enter   [employer] of Steps: 3   Home Layout: One level Home Equipment: Agricultural consultant (2 wheels);Cane - single point      Prior Function Prior Level of Function : Independent/Modified Independent             Mobility Comments: Able to do all things independently including drive ADLs Comments: Able to bathe, dress, and clean self independently     Hand Dominance        Extremity/Trunk Assessment                Communication   Communication: No difficulties  Cognition Arousal/Alertness: Awake/alert Behavior During Therapy: WFL for tasks assessed/performed Overall Cognitive Status: Within Functional Limits for tasks assessed                                          General Comments      Exercises     Assessment/Plan    PT Assessment Patient needs continued PT services  PT Problem List Decreased strength;Decreased activity tolerance;Decreased balance;Decreased mobility       PT Treatment Interventions DME instruction;Balance training;Gait training;Stair training;Functional mobility training;Therapeutic activities;Therapeutic exercise    PT Goals (Current goals can be found in the Care Plan section)  Acute Rehab PT Goals Patient Stated Goal: Return home with assist of family PT Goal Formulation: With patient Time For Goal Achievement: 09/18/22 Potential to Achieve Goals: Good    Frequency Min 3X/week     Co-evaluation               AM-PAC PT "6 Clicks" Mobility  Outcome Measure Help needed turning from your back to your side while in a flat bed without using bedrails?: A Little Help needed moving from lying on your back to sitting on the side of a flat bed without using bedrails?: A Lot Help needed moving to and from a bed to a chair (including a wheelchair)?: A Lot Help needed standing up from a chair using your arms (e.g., wheelchair or bedside chair)?: A Lot Help needed to walk in hospital room?: A Lot Help needed climbing 3-5  steps with a railing? : Total 6 Click Score: 12    End of Session   Activity Tolerance: Patient limited by fatigue Patient left: in chair;with call bell/phone within reach;with family/visitor present Nurse Communication: Mobility status PT Visit Diagnosis: Unsteadiness on feet (R26.81);Other abnormalities of gait and mobility (R26.89);Muscle weakness (generalized) (M62.81);History of falling (Z91.81)    Time: 1610-9604 PT Time Calculation (min) (ACUTE ONLY): 27 min   Charges:   PT Evaluation $PT Eval Moderate Complexity: 1 Mod PT Treatments $Therapeutic Activity: 23-37 mins PT General Charges $$ ACUTE PT VISIT: 1 Visit         Garren Greenman SPT [place], DPT Program

## 2022-09-04 NOTE — Plan of Care (Signed)

## 2022-09-04 NOTE — Progress Notes (Addendum)
Patient currently seeing GYN for rectovaginal fistula. Dr. Jayme Cloud made aware. CT abd/pelvis ordered.

## 2022-09-04 NOTE — Plan of Care (Signed)
  Problem: Acute Rehab PT Goals(only PT should resolve) Goal: Pt Will Go Supine/Side To Sit Outcome: Progressing Flowsheets (Taken 09/04/2022 1515) Pt will go Supine/Side to Sit: with minimal assist Goal: Patient Will Transfer Sit To/From Stand Outcome: Progressing Flowsheets (Taken 09/04/2022 1515) Patient will transfer sit to/from stand:  with moderate assist  with minimal assist Goal: Pt Will Transfer Bed To Chair/Chair To Bed Outcome: Progressing Flowsheets (Taken 09/04/2022 1515) Pt will Transfer Bed to Chair/Chair to Bed:  with mod assist  with min assist Goal: Pt Will Ambulate Outcome: Progressing Flowsheets (Taken 09/04/2022 1515) Pt will Ambulate:  10 feet  with moderate assist   Zehava Turski SPT [facility] [employer], DPT Program

## 2022-09-04 NOTE — Progress Notes (Signed)
eLink Physician-Brief Progress Note Patient Name: Kelli Hurley DOB: [birth year] MRN: [id number]   Date of Service  09/04/2022  HPI/Events of Note  67 year old with a history of diabetes on Farxiga and Ozempic, essential hypertension, and morbid obesity in the setting of multiple myeloma who presents to the hospital with weakness.  She is found to be hypotensive, febrile, and hypoperfusing-despite adequate crystalloid resuscitation, she is started on norepinephrine and referred for admission.  She is tachypneic, tachycardic, and hypotensive on arrival.  Laboratory studies show reduced renal function, hyponatremia, anemia and mild leukocytosis.  eICU Interventions  Urine electrolytes and urinalysis are pending.  Norepinephrine is needed to maintain MAP greater than 65  Community-acquired pneumonia coverage.  DVT prophylaxis with heparin GI prophylaxis not indicated     Intervention Category Evaluation Type: New Patient Evaluation  Laci Frenkel 09/04/2022, 1:27 AM

## 2022-09-04 NOTE — Progress Notes (Signed)
PROGRESS NOTE     Kelli Hurley, is a 67 y.o. female, DOB - 1955/05/14, UUV:[id number]  Admit date - 09/03/2022   Admitting Physician Frankey Shown, DO  Outpatient Primary MD for the patient is [facility]  LOS - 1  Chief Complaint  Patient presents with   Weakness        Brief Narrative:  67 y.o. female with medical history significant of hypertension, hyperlipidemia, multiple myeloma in remission with no evidence of cardiac amyloidosis per cardiac MRI in 2021, OSA on CPAP, CKD stage III admitted on 09/03/2022 with concerns for sepsis due to pneumonia    -Assessment and Plan: 1) severe sepsis with septic shock --- secondary to pneumonia -Successfully weaned off IV Levophed on 09/04/2022 -Lactic acid normalized -Continue Rocephin with azithromycin -Patient with questionable history of recto-vaginal fistula -Get CT abdomen and pelvis to further evaluate this unfortunately unable to use IV contrast given renal for concerns  2)HypoNatremia/hypokalemia/hypochloremia --- avoid excessive free water -Replete potassium and hydrate  3)AKI----acute kidney injury on CKD stage -IV -Creatinine on admission 3.34 -Baseline creatinine recently around 2 -Bicarb and anion gap WNL - renally adjust medications, avoid nephrotoxic agents / dehydration  / hypotension  4)HTN--continue to hold BP meds due to sepsis with septic shock  5)Multiple myeloma Patient has relapsed IgG kappa multiple myeloma with high risk features Patient's is on pomalidomide 4 mg 3 weeks on/1 week off. Continue dexamethasone 20 mg weekly  She follows with Dr. Ellin Saba with last visit being on 06/28/2022  6)Morbid Obesity/OSA -Low calorie diet, portion control and increase physical activity discussed with patient -Body mass index is 55.32 kg/m. -Continue Ozempic for weight loss -CPAP nightly encouraged   CRITICAL CARE Performed by: Shon Hale  Total critical care time: 53  minutes  Critical care time was exclusive of separately billable procedures and treating other patients. - Severe Sepsis with septic shock and persistent hypotension requiring Levophed for pressure support trying to wean off Levophed Critical care was necessary to treat or prevent imminent or life-threatening deterioration.  Critical care was time spent personally by me on the following activities: development of treatment plan with patient and/or surrogate as well as nursing, discussions with consultants, evaluation of patient's response to treatment, examination of patient, obtaining history from patient or surrogate, ordering and performing treatments and interventions, ordering and review of laboratory studies, ordering and review of radiographic studies, pulse oximetry and re-evaluation of patient's condition.  Status is: Inpatient   Disposition: The patient is from: Home              Anticipated d/c is to: Home              Anticipated d/c date is: 2 days              Patient currently is not medically stable to d/c. Barriers: Not Clinically Stable-   Code Status :  -  Code Status: Full Code   Family Communication:    (patient is alert, awake and coherent)  Discussed with daughter at bedside  DVT Prophylaxis  :   - SCDs  heparin injection 5,000 Units Start: 09/04/22 0600 SCDs Start: 09/03/22 2302  Lab Results  Component Value Date   PLT 198 09/04/2022   Inpatient Medications  Scheduled Meds:  Chlorhexidine Gluconate Cloth  6 each Topical Daily   dextromethorphan-guaiFENesin  1 tablet Oral BID   feeding supplement  237 mL Oral BID BM   heparin  5,000 Units Subcutaneous  Q8H   insulin aspart  0-9 Units Subcutaneous TID WC   Continuous Infusions:  azithromycin Stopped (09/03/22 2229)   cefTRIAXone (ROCEPHIN)  IV Stopped (09/03/22 2148)   norepinephrine (LEVOPHED) Adult infusion Stopped (09/04/22 0851)   PRN Meds:.acetaminophen **OR** acetaminophen, ondansetron **OR**  ondansetron (ZOFRAN) IV, mouth rinse   Anti-infectives (From admission, onward)    Start     Dose/Rate Route Frequency Ordered Stop   09/03/22 2115  azithromycin (ZITHROMAX) 500 mg in sodium chloride 0.9 % 250 mL IVPB        500 mg 250 mL/hr over 60 Minutes Intravenous Every 24 hours 09/03/22 2109     09/03/22 2030  cefTRIAXone (ROCEPHIN) 2 g in sodium chloride 0.9 % 100 mL IVPB        2 g 200 mL/hr over 30 Minutes Intravenous Every 24 hours 09/03/22 2027 09/10/22 2029      Subjective: Kelli Hurley today has no fevers, no emesis,  No chest pain,   - Cough and fatigue persist -Tmax 102.4  -T-current 98.7 -Patient's daughter is at bedside, questions answered  Objective: Vitals:   09/04/22 1445 09/04/22 1500 09/04/22 1530 09/04/22 1643  BP: (!) 83/48 96/61 (!) 108/56   Pulse: (!) 105 (!) 113 (!) 103   Resp: (!) 22 (!) 26 (!) 24   Temp:    98.7 F (37.1 C)  TempSrc:    Oral  SpO2: 96% 94% 94%   Weight:      Height:        Intake/Output Summary (Last 24 hours) at 09/04/2022 1831 Last data filed at 09/04/2022 1719 Gross per 24 hour  Intake 6373.98 ml  Output 300 ml  Net 6073.98 ml   Filed Weights   09/03/22 2016 09/04/22 0012  Weight: 134.4 kg (!) 137.2 kg    Physical Exam  Gen:- Awake Alert,  in no apparent distress  HEENT:- .AT, No sclera icterus Neck-Supple Neck,No JVD,.  Lungs-  CTAB , fair symmetrical air movement CV- S1, S2 normal, regular , left chest wall Port-A-Cath Abd-  +ve B.Sounds, Abd Soft, No tenderness, increased truncal adiposity, no CVA area tenderness Extremity/Skin:- No  edema, pedal pulses present  Psych-affect is appropriate, oriented x3 Neuro-no new focal deficits, no tremors  Data Reviewed: I have personally reviewed following labs and imaging studies  CBC: Recent Labs  Lab 09/03/22 2026 09/04/22 0449  WBC 11.5* 9.3  NEUTROABS 7.8*  --   HGB 9.5* 8.8*  HCT 29.6* 27.3*  MCV 94.0 93.2  PLT 217 198   Basic Metabolic  Panel: Recent Labs  Lab 09/03/22 2026 09/04/22 0449 09/04/22 1102  NA 128* 131* 127*  K 3.4* 3.6 3.3*  CL 92* 96* 97*  CO2 25 25 23   GLUCOSE 145* 140* 131*  BUN 52* 46* 45*  CREATININE 3.34* 2.91* 2.93*  CALCIUM 8.5* 8.1* 7.7*  MG  --  1.8  --   PHOS  --  3.2  --    GFR: Estimated Creatinine Clearance: 25.7 mL/min (A) (by C-G formula based on SCr of 2.93 mg/dL (H)). Liver Function Tests: Recent Labs  Lab 09/03/22 2026 09/04/22 0449  AST 54* 97*  ALT 18 27  ALKPHOS 43 45  BILITOT 1.3* 1.0  PROT 6.6 6.1*  ALBUMIN 3.4* 2.9*   Recent Results (from the past 240 hour(s))  Resp panel by RT-PCR (RSV, Flu A&B, Covid) Anterior Nasal Swab     Status: None   Collection Time: 09/03/22  8:25 PM   Specimen: Anterior Nasal  Swab  Result Value Ref Range Status   SARS Coronavirus 2 by RT PCR NEGATIVE NEGATIVE Final    Comment: (NOTE) SARS-CoV-2 target nucleic acids are NOT DETECTED.  The SARS-CoV-2 RNA is generally detectable in upper respiratory specimens during the acute phase of infection. The lowest concentration of SARS-CoV-2 viral copies this assay can detect is 138 copies/mL. A negative result does not preclude SARS-Cov-2 infection and should not be used as the sole basis for treatment or other patient management decisions. A negative result may occur with  improper specimen collection/handling, submission of specimen other than nasopharyngeal swab, presence of viral mutation(s) within the areas targeted by this assay, and inadequate number of viral copies(<138 copies/mL). A negative result must be combined with clinical observations, patient history, and epidemiological information. The expected result is Negative.  Fact Sheet for Patients:  BloggerCourse.com  Fact Sheet for Healthcare Providers:  SeriousBroker.it  This test is no t yet approved or cleared by the [place] FDA and  has been authorized for detection  and/or diagnosis of SARS-CoV-2 by FDA under an Emergency Use Authorization (EUA). This EUA will remain  in effect (meaning this test can be used) for the duration of the COVID-19 declaration under Section 564(b)(1) of the Act, 21 U.S.C.section 360bbb-3(b)(1), unless the authorization is terminated  or revoked sooner.       Influenza A by PCR NEGATIVE NEGATIVE Final   Influenza B by PCR NEGATIVE NEGATIVE Final    Comment: (NOTE) The Xpert Xpress SARS-CoV-2/FLU/RSV plus assay is intended as an aid in the diagnosis of influenza from Nasopharyngeal swab specimens and should not be used as a sole basis for treatment. Nasal washings and aspirates are unacceptable for Xpert Xpress SARS-CoV-2/FLU/RSV testing.  Fact Sheet for Patients: BloggerCourse.com  Fact Sheet for Healthcare Providers: SeriousBroker.it  This test is not yet approved or cleared by the [place] FDA and has been authorized for detection and/or diagnosis of SARS-CoV-2 by FDA under an Emergency Use Authorization (EUA). This EUA will remain in effect (meaning this test can be used) for the duration of the COVID-19 declaration under Section 564(b)(1) of the Act, 21 U.S.C. section 360bbb-3(b)(1), unless the authorization is terminated or revoked.     Resp Syncytial Virus by PCR NEGATIVE NEGATIVE Final    Comment: (NOTE) Fact Sheet for Patients: BloggerCourse.com  Fact Sheet for Healthcare Providers: SeriousBroker.it  This test is not yet approved or cleared by the [place] FDA and has been authorized for detection and/or diagnosis of SARS-CoV-2 by FDA under an Emergency Use Authorization (EUA). This EUA will remain in effect (meaning this test can be used) for the duration of the COVID-19 declaration under Section 564(b)(1) of the Act, 21 U.S.C. section 360bbb-3(b)(1), unless the authorization is terminated  or revoked.  Performed at [facility], [address] [place], [place] [address]   Blood Culture (routine x 2)     Status: None (Preliminary result)   Collection Time: 09/03/22  8:53 PM   Specimen: Left Antecubital; Blood  Result Value Ref Range Status   Specimen Description LEFT ANTECUBITAL  Final   Special Requests   Final    BOTTLES DRAWN AEROBIC AND ANAEROBIC Blood Culture results may not be optimal due to an excessive volume of blood received in culture bottles   Culture   Final    NO GROWTH < 12 HOURS Performed at [facility], [address], [place], [place] [address]    Report Status PENDING  Incomplete  Blood Culture (routine  x 2)     Status: None (Preliminary result)   Collection Time: 09/03/22  9:02 PM   Specimen: Right Antecubital; Blood  Result Value Ref Range Status   Specimen Description RIGHT ANTECUBITAL  Final   Special Requests   Final    BOTTLES DRAWN AEROBIC AND ANAEROBIC Blood Culture results may not be optimal due to an excessive volume of blood received in culture bottles   Culture   Final    NO GROWTH < 12 HOURS Performed at [facility], [address], [place], [place] [address]    Report Status PENDING  Incomplete  MRSA Next Gen by PCR, Nasal     Status: None   Collection Time: 09/04/22 12:00 AM  Result Value Ref Range Status   MRSA by PCR Next Gen NOT DETECTED NOT DETECTED Final    Comment: (NOTE) The GeneXpert MRSA Assay (FDA approved for NASAL specimens only), is one component of a comprehensive MRSA colonization surveillance program. It is not intended to diagnose MRSA infection nor to guide or monitor treatment for MRSA infections. Test performance is not FDA approved in patients less than 17 years old. Performed at [facility], [address], [place], [place] [address]   Expectorated Sputum Assessment w Gram Stain, Rflx to Resp Cult     Status: None   Collection Time: 09/04/22 11:41 AM   Specimen: Sputum  Result Value Ref Range Status    Specimen Description SPU  Final   Special Requests NONE  Final   Sputum evaluation   Final    Sputum specimen not acceptable for testing.  Please recollect.   Gram Stain Report Called to,Read Back By and Verified WithCaralee Ates 1211 09/04/22 [facility] Performed at [facility], [address], [place], [place] [address]    Report Status 09/04/2022 FINAL  Final    Radiology Studies: [facility] Chest Port 1 View  Result Date: 09/03/2022 CLINICAL DATA:  Possible sepsis. EXAM: PORTABLE CHEST 1 VIEW COMPARISON:  08/06/2019. FINDINGS: Heart is enlarged and the mediastinal contour is within normal limits. Lung volumes are low with mild airspace disease at the left lung base. No definite effusion or pneumothorax. A left chest port is stable in position. No acute osseous abnormality. IMPRESSION: 1. Mild airspace disease at the left lung base, possible atelectasis or infiltrate. 2. Cardiomegaly. Electronically Signed   By: Thornell Sartorius M.D.   On: 09/03/2022 20:52    Scheduled Meds:  Chlorhexidine Gluconate Cloth  6 each Topical Daily   dextromethorphan-guaiFENesin  1 tablet Oral BID   feeding supplement  237 mL Oral BID BM   heparin  5,000 Units Subcutaneous Q8H   insulin aspart  0-9 Units Subcutaneous TID WC   Continuous Infusions:  azithromycin Stopped (09/03/22 2229)   cefTRIAXone (ROCEPHIN)  IV Stopped (09/03/22 2148)   norepinephrine (LEVOPHED) Adult infusion Stopped (09/04/22 0851)    LOS: 1 day   Shon Hale M.D on 09/04/2022 at 6:31 PM  Go to [email or web address] - for contact info  [facility] - Office  [phone]  If 7PM-7AM, please contact night-coverage [email or web address] 09/04/2022, 6:31 PM

## 2022-09-04 NOTE — TOC Progression Note (Signed)
Transition of Care [facility]) - Progression Note    Patient Details  Name: Kelli Hurley MRN: [id number] Date of Birth: 1955-08-28  Transition of Care [facility] CM/SW Contact  Karn Cassis, [place] Phone Number: 09/04/2022, 3:37 PM  Clinical Narrative:  Discussed PT recommendation for SNF. Pt and pt's daughter decline and feel pt can manage at home with assistance from family. They are agreeable to home health PT with no preference on agency. Referred and accepted by [facility] with [facility]. Will need HHPT order.      Expected Discharge Plan: Home/Self Care Barriers to Discharge: Continued Medical Work up  Expected Discharge Plan and Services In-house Referral: Clinical Social Work     Living arrangements for the past 2 months: Single Family Home                           HH Arranged: PT HH Agency: [facility] Date [facility] Agency Contacted: 09/04/22 Time HH Agency Contacted: 1537 Representative spoke with at [facility] Agency: Kandee Keen   Social Determinants of Health (SDOH) Interventions SDOH Screenings   Food Insecurity: No Food Insecurity (09/04/2022)  Housing: Low Risk  (09/04/2022)  Transportation Needs: No Transportation Needs (09/04/2022)  Utilities: Not At Risk (09/04/2022)  Alcohol Screen: Low Risk  (08/16/2022)  Depression (PHQ2-9): Low Risk  (08/16/2022)  Financial Resource Strain: Medium Risk (08/16/2022)  Physical Activity: Insufficiently Active (08/16/2022)  Social Connections: Moderately Isolated (08/16/2022)  Stress: No Stress Concern Present (08/16/2022)  Tobacco Use: Low Risk  (09/03/2022)    Readmission Risk Interventions    09/04/2022    8:40 AM  Readmission Risk Prevention Plan  Transportation Screening Complete  HRI or Home Care Consult Complete  Social Work Consult for Recovery Care Planning/Counseling Complete  Palliative Care Screening Not Applicable  Medication Review Oceanographer) Complete

## 2022-09-04 NOTE — TOC Initial Note (Signed)
Transition of Care [facility]) - Initial/Assessment Note    Patient Details  Name: Kelli Hurley MRN: [id number] Date of Birth: [birth year]  Transition of Care [facility]) CM/SW Contact:    Karn Cassis, LCSW Phone Number: 09/04/2022, 8:41 AM  Clinical Narrative: Pt admitted with sepsis due to pneumonia. Assessment completed due to high risk readmission score. Pt lives alone and is independent with ADLs. She ambulates with a cane at baseline. Family typically provides transportation to appointments. Pt plans to return home when medically stable. No needs reported at this time. TOC will follow.                   Expected Discharge Plan: Home/Self Care Barriers to Discharge: Continued Medical Work up   Patient Goals and CMS Choice Patient states their goals for this hospitalization and ongoing recovery are:: return home   Choice offered to / list presented to : [facility] ownership interest in [facility].provided to::  (n/a)    Expected Discharge Plan and Services In-house Referral: Clinical Social Work     Living arrangements for the past 2 months: Single Family Home                                      Prior Living Arrangements/Services Living arrangements for the past 2 months: Single Family Home Lives with:: Self Patient language and need for interpreter reviewed:: Yes Do you feel safe going back to the place where you live?: Yes      Need for Family Participation in Patient Care: No (Comment)   Current home services: DME (cane, walker) Criminal Activity/Legal Involvement Pertinent to Current Situation/Hospitalization: No - Comment as needed  Activities of Daily Living Home Assistive Devices/Equipment: CPAP ADL Screening (condition at time of admission) Patient's cognitive ability adequate to safely complete daily activities?: Yes Is the patient deaf or have difficulty hearing?: No Does the patient have difficulty seeing, even  when wearing glasses/contacts?: No Does the patient have difficulty concentrating, remembering, or making decisions?: No Patient able to express need for assistance with ADLs?: Yes Does the patient have difficulty dressing or bathing?: No Independently performs ADLs?: Yes (appropriate for developmental age) Does the patient have difficulty walking or climbing stairs?: No Weakness of Legs: Both Weakness of Arms/Hands: None  Permission Sought/Granted                  Emotional Assessment         Alcohol / Substance Use: Not Applicable Psych Involvement: No (comment)  Admission diagnosis:  Sepsis, due to unspecified organism, unspecified whether acute organ dysfunction present [facility]) [A41.9] Patient Active Problem List   Diagnosis Date Noted   Sepsis due to pneumonia (HCC) 09/03/2022   Lactic acidosis 09/03/2022   Hyponatremia 09/03/2022   Acute kidney injury superimposed on CKD (HCC) 09/03/2022   Hypoalbuminemia due to protein-calorie malnutrition (HCC) 09/03/2022   Hypokalemia 09/03/2022   Mixed hyperlipidemia 09/03/2022   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   S/P hysterectomy with oophorectomy 08/16/2022   Vaginal discharge 08/16/2022   OSA (obstructive sleep apnea) 04/07/2021   Heart failure with preserved ejection fraction (HCC) 12/09/2020   CKD (chronic kidney disease) stage 3, GFR 30-59 ml/min (HCC) 07/28/2020   Anemia in neoplastic disease 07/28/2020   Essential hypertension 05/12/2020   Nocturnal hypoxemia 09/02/2019   Colon cancer screening 07/31/2019   Morbid obesity with BMI of 50.0-59.9, adult (  HCC) 06/28/2019   SOB (shortness of breath) 06/27/2019   Goals of care, counseling/discussion 02/25/2019   H/O autologous stem cell transplant (HCC) 05/13/2015   Claustrophobia 10/05/2014   Multiple myeloma (HCC) 09/23/2014   Normocytic hypochromic anemia 08/03/2014   Leukopenia 08/03/2014   Primary osteoarthritis of both knees 11/18/2013   PCP:  [facility] Pharmacy:   [facility] - [place], [place] - [address] [place] [address] Phone: [phone] Fax: [phone]  [facility] - [place], [place] - [address] [place] [address] Phone: [phone] Fax: [phone]     Social Determinants of Health (SDOH) Social History: SDOH Screenings   Food Insecurity: No Food Insecurity (09/04/2022)  Housing: Low Risk  (09/04/2022)  Transportation Needs: No Transportation Needs (09/04/2022)  Utilities: Not At Risk (09/04/2022)  Alcohol Screen: Low Risk  (08/16/2022)  Depression (PHQ2-9): Low Risk  (08/16/2022)  Financial Resource Strain: Medium Risk (08/16/2022)  Physical Activity: Insufficiently Active (08/16/2022)  Social Connections: Moderately Isolated (08/16/2022)  Stress: No Stress Concern Present (08/16/2022)  Tobacco Use: Low Risk  (09/03/2022)   SDOH Interventions:     Readmission Risk Interventions    09/04/2022    8:40 AM  Readmission Risk Prevention Plan  Transportation Screening Complete  HRI or Home Care Consult Complete  Social Work Consult for Recovery Care Planning/Counseling Complete  Palliative Care Screening Not Applicable  Medication Review Oceanographer) Complete

## 2022-09-04 NOTE — Plan of Care (Signed)

## 2022-09-05 DIAGNOSIS — N179 Acute kidney failure, unspecified: Secondary | ICD-10-CM | POA: Diagnosis not present

## 2022-09-05 DIAGNOSIS — J189 Pneumonia, unspecified organism: Secondary | ICD-10-CM | POA: Diagnosis not present

## 2022-09-05 DIAGNOSIS — A419 Sepsis, unspecified organism: Secondary | ICD-10-CM | POA: Diagnosis not present

## 2022-09-05 DIAGNOSIS — R9431 Abnormal electrocardiogram [ECG] [EKG]: Secondary | ICD-10-CM | POA: Diagnosis not present

## 2022-09-05 DIAGNOSIS — I4892 Unspecified atrial flutter: Secondary | ICD-10-CM

## 2022-09-05 DIAGNOSIS — N824 Other female intestinal-genital tract fistulae: Secondary | ICD-10-CM

## 2022-09-05 DIAGNOSIS — N131 Hydronephrosis with ureteral stricture, not elsewhere classified: Secondary | ICD-10-CM

## 2022-09-05 DIAGNOSIS — E876 Hypokalemia: Secondary | ICD-10-CM | POA: Diagnosis not present

## 2022-09-05 LAB — MAGNESIUM: Magnesium: 1.8 mg/dL (ref 1.7–2.4)

## 2022-09-05 LAB — PHOSPHORUS: Phosphorus: 1.9 mg/dL — ABNORMAL LOW (ref 2.5–4.6)

## 2022-09-05 LAB — GLUCOSE, CAPILLARY
Glucose-Capillary: 114 mg/dL — ABNORMAL HIGH (ref 70–99)
Glucose-Capillary: 110 mg/dL — ABNORMAL HIGH (ref 70–99)
Glucose-Capillary: 123 mg/dL — ABNORMAL HIGH (ref 70–99)
Glucose-Capillary: 114 mg/dL — ABNORMAL HIGH (ref 70–99)

## 2022-09-05 MED ORDER — ENOXAPARIN SODIUM 150 MG/ML IJ SOSY
135.0000 mg | PREFILLED_SYRINGE | INTRAMUSCULAR | Status: DC
  Administered 2022-09-05 – 2022-09-06 (×2): 135 mg via SUBCUTANEOUS
  Filled 2022-09-05 (×4): qty 1

## 2022-09-05 MED ORDER — AMIODARONE HCL IN DEXTROSE 360-4.14 MG/200ML-% IV SOLN: 60.0000 mg/h | INTRAVENOUS | Status: AC

## 2022-09-05 MED ORDER — AMIODARONE HCL IN DEXTROSE 360-4.14 MG/200ML-% IV SOLN
INTRAVENOUS | Status: AC
  Administered 2022-09-05: 60 mg/h via INTRAVENOUS
  Filled 2022-09-05: qty 200

## 2022-09-05 MED ORDER — POTASSIUM CHLORIDE CRYS ER 20 MEQ PO TBCR
40.0000 meq | EXTENDED_RELEASE_TABLET | Freq: Once | ORAL | Status: AC
  Administered 2022-09-05: 40 meq via ORAL
  Filled 2022-09-05: qty 2

## 2022-09-05 MED ORDER — AMIODARONE HCL IN DEXTROSE 360-4.14 MG/200ML-% IV SOLN
30.0000 mg/h | INTRAVENOUS | Status: DC
  Administered 2022-09-05 – 2022-09-08 (×7): 30 mg/h via INTRAVENOUS
  Filled 2022-09-05 (×7): qty 200

## 2022-09-05 MED ORDER — MAGNESIUM SULFATE 2 GM/50ML IV SOLN
2.0000 g | Freq: Once | INTRAVENOUS | Status: AC
  Administered 2022-09-05: 2 g via INTRAVENOUS
  Filled 2022-09-05: qty 50

## 2022-09-05 MED ORDER — POTASSIUM PHOSPHATES 15 MMOLE/5ML IV SOLN
30.0000 mmol | Freq: Once | INTRAVENOUS | Status: AC
  Administered 2022-09-05: 30 mmol via INTRAVENOUS
  Filled 2022-09-05: qty 10

## 2022-09-05 NOTE — Progress Notes (Signed)
ANTICOAGULATION CONSULT NOTE - Initial Consult  Pharmacy Consult for Lovenox  Indication: atrial fibrillation/flutter  Allergies  Allergen Reactions   Diclofenac Swelling    Per pt, facial swelling  Other Reaction(s): Other (See Comments)  Per pt, facial swelling, Per pt, facial swelling    Patient Measurements: Height: 5\' 2"  (157.5 cm) Weight: (!) 137.4 kg (302 lb 14.6 oz) IBW/kg (Calculated) : 50.1   Vital Signs: Temp: 101.5 F (38.6 C) (07/30 0455) Temp Source: Axillary (07/30 0455) BP: 116/61 (07/30 0445) Pulse Rate: 130 (07/30 0445)  Labs: Recent Labs    09/03/22 2026 09/04/22 0449 09/04/22 1102  HGB 9.5* 8.8*  --   HCT 29.6* 27.3*  --   PLT 217 198  --   APTT 24  --   --   LABPROT 15.5*  --   --   INR 1.2  --   --   CREATININE 3.34* 2.91* 2.93*    Estimated Creatinine Clearance: 25.7 mL/min (A) (by C-G formula based on SCr of 2.93 mg/dL (H)).   Medical History: Past Medical History:  Diagnosis Date   Anemia    Arthritis    Cancer (HCC)    Claustrophobia 10/05/2014   Hypertension    Leukopenia 08/03/2014   Normocytic hypochromic anemia 08/03/2014   Renal disorder    stage 3      Assessment: Pt with afib/flutter overnight, starting Lovenox, labs above reviewed, renal function requiring dosing frequency adjustment   Goal of Therapy:  Monitor platelets by anticoagulation protocol: Yes   Plan:  DC subcutaneous heparin  Start Lovenox 1 mg/kg subcutaneous q24h Daily CBC Monitor for bleeding  Abran Duke, PharmD, BCPS Clinical Pharmacist Phone: [phone]

## 2022-09-05 NOTE — Consult Note (Signed)
[facility] Consult  Reason for Consult: Colovaginal fistula  Referring Physician: Dr. Mariea Clonts   Chief Complaint   Weakness     HPI: Kelli Hurley is a 67 y.o. female with multiple myeloma, CKD with some acute renal dysfunction currently, who reported 2-3 months of having discharge/ stool from her vagina. She was seeing her PCP and received some creams and then was sent to GYN. She saw Cyril Mourning NP who did an exam and suspected fistula as the patient was s/p hysterectomy. She had planned for CT 09/14/22. In the mean time the patient got admitted for weakness and falling and was worked up and found to have hypotension and concern for sepsis related to a PNA in the left lung per documentation.  She was also having fevers.   She was started on antibiotics and some levophed at a low dose and was seen by the hospitalist.   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 She also found to have hydronephrosis and hydroureter. She had a UA done that showed large amount of LE and was cloudy, no culture.   She says she has not had any abdominal pain or pelvic pain. She has had the discharge from her vagina that continues. She had a colonoscopy in 2021 with diverticula but denies any prior diverticulitis attacks. She has a brother that had colon cancer per report but documentation in epic shows prostate cancer in the brother, so I am unsure.    Last night she went into A flutter and was started on amiodarone and cardiology consult was placed per the documentation.   She is in her room today with 3 family members who she was ok with me discussing the current state of things.   Past Medical History:  Diagnosis Date   Anemia    Arthritis    Cancer (HCC)    Claustrophobia 10/05/2014   Hypertension    Leukopenia 08/03/2014   Normocytic hypochromic anemia 08/03/2014    Renal disorder    stage 3     Past Surgical History:  Procedure Laterality Date   ABDOMINAL HYSTERECTOMY     CARDIAC SURGERY     59 months old. States she had a leaky valve.    COLONOSCOPY  03/26/2009   Dr. Darrick Penna; normal colon, small internal hemorrhoids.  Recommended repeat colonoscopy in 10 years.   COLONOSCOPY WITH PROPOFOL N/A 11/25/2019   Procedure: COLONOSCOPY WITH PROPOFOL;  Surgeon: Lanelle Bal, DO;  Location: [facility];  Service: Endoscopy;  Laterality: N/A;  10:45am   OTHER SURGICAL HISTORY     heart surgery as infant to "repair hole in heart"   PORTACATH PLACEMENT Left 02/21/2019   Procedure: INSERTION PORT-A-CATH;  Surgeon: Lucretia Roers, MD;  Location: [facility] ORS;  Service: General;  Laterality: Left;    Family History  Problem Relation Age of Onset   Cancer Maternal Grandmother    Cancer Father    Hypertension Father    Cancer Mother    Hypertension Mother    Hypertension Brother    Prostate cancer Brother    Hypertension Brother    Hypertension Brother    Hypertension Sister    Hypertension Sister    Hypertension Sister    Hypertension Sister    Colon cancer Neg Hx    Colon polyps Neg Hx     Social History   Tobacco  Use   Smoking status: Never   Smokeless tobacco: Never  Vaping Use   Vaping status: Never Used  Substance Use Topics   Alcohol use: No   Drug use: No    Medications: I have reviewed the patient's current medications. Prior to Admission:  Medications Prior to Admission  Medication Sig Dispense Refill Last Dose   acetaminophen (TYLENOL) 650 MG CR tablet Take 650 mg by mouth every 8 (eight) hours as needed for pain.   unknown   albuterol (VENTOLIN HFA) 108 (90 Base) MCG/ACT inhaler Inhale 2 puffs into the lungs every 6 (six) hours as needed for wheezing or shortness of breath. 8 g 3 unknown   aspirin EC 81 MG tablet Take 81 mg by mouth daily.   09/03/2022   calcitRIOL (ROCALTROL) 0.25 MCG capsule Take 0.25 mcg by mouth daily.    09/03/2022   dexamethasone (DECADRON) 4 MG tablet TAKE FIVE TABLETS BY MOUTH ONCE A WEEK 30 tablet 6 08/29/2022   furosemide (LASIX) 20 MG tablet TAKE 2 TABLETS BY MOUTH DAILY IN THE MORNING AND TAKE 2 TABLETS AT BEDTIME (Patient taking differently: Take 20 mg by mouth as directed. 2 tablet in the morning and 2 tablets at bedtime on Mon., Wed., and Friday. 1 tablet in the morning and 1 tablet at bedtime on Sun., Tues., Thurs., and Sat.) 120 tablet 8 09/03/2022   Infant Care Products (DERMACLOUD) OINT Apply to affected area as needed Externally for 30 days   unknown   lidocaine-prilocaine (EMLA) cream APPLY 1 QUARTER SIZED AMOUNT 1 HOUR PRIOR TO TREATMENT 30 g 10 unknown   losartan (COZAAR) 25 MG tablet Take 12.5 mg by mouth daily.   09/03/2022   magnesium oxide (MAG-OX) 400 (240 Mg) MG tablet TAKE 1 TABLET BY MOUTH TWICE DAILY 60 tablet 6 09/03/2022   metolazone (ZAROXOLYN) 2.5 MG tablet Take 1 tablet (2.5 mg total) by mouth daily. (Patient taking differently: Take 2.5 mg by mouth 3 (three) times a week. Mon., Wed., and Friday) 30 tablet 6 09/01/2022   Multiple Vitamin (TAB-A-VITE) TABS TAKE 1 TABLET BY MOUTH ONCE DAILY. (Patient taking differently: Take 1 tablet by mouth daily.) 30 tablet 11 09/03/2022   oxyCODONE-acetaminophen (PERCOCET/ROXICET) 5-325 MG tablet TAKE 1 OR 2 TABLETS BY MOUTH EVERY TWELVE HOURS AS NEEDED for severe pain 60 tablet 0 09/03/2022   pomalidomide (POMALYST) 4 MG capsule Take 1 capsule (4 mg total) by mouth daily. 21 days on, 7 days off 21 capsule 0 08/29/2022   potassium chloride SA (KLOR-CON M) 20 MEQ tablet TAKE 1 TABLET BY MOUTH THREE TIMES DAILY (Patient taking differently: Take 20 mEq by mouth daily.) 90 tablet 10 09/03/2022   pregabalin (LYRICA) 75 MG capsule Take 1 capsule (75 mg total) by mouth daily. 30 capsule 2 09/03/2022   rosuvastatin (CRESTOR) 10 MG tablet Take 1 tablet (10 mg total) by mouth daily. 90 tablet 1 09/03/2022   Semaglutide, 2 MG/DOSE, (OZEMPIC, 2 MG/DOSE,) 8  MG/3ML SOPN Inject 2 mg into the skin once a week. 3 mL 11 08/29/2022   trolamine salicylate (ASPERCREME) 10 % cream Apply 1 application topically 2 (two) times daily as needed for muscle pain.   unknown   VITAMIN D PO Take by mouth.   09/03/2022   Scheduled:  Chlorhexidine Gluconate Cloth  6 each Topical Daily   dextromethorphan-guaiFENesin  1 tablet Oral BID   enoxaparin (LOVENOX) injection  135 mg Subcutaneous Q24H   feeding supplement  237 mL Oral BID BM   insulin  aspart  0-9 Units Subcutaneous TID WC   Continuous:  sodium chloride 100 mL/hr at 09/05/22 1248   amiodarone 30 mg/hr (09/05/22 1248)   azithromycin 500 mg (09/04/22 2040)   cefTRIAXone (ROCEPHIN)  IV 2 g (09/04/22 1940)   norepinephrine (LEVOPHED) Adult infusion Stopped (09/05/22 1231)   GMW:NUUVOZDGUYQIH **OR** acetaminophen, ondansetron **OR** ondansetron (ZOFRAN) IV, mouth rinse  Allergies  Allergen Reactions   Diclofenac Swelling    Per pt, facial swelling  Other Reaction(s): Other (See Comments)  Per pt, facial swelling, Per pt, facial swelling     ROS:  A comprehensive review of systems was negative except for: Constitutional: positive for fevers, malaise, and weakness Cardiovascular: positive for a flutter new onset Genitourinary: positive for vaginal discharge/ stool like, Acute on chronic renal dysfunction, hydroureter/ hydronephrosis on imaging   Blood pressure (!) 111/44, pulse (!) 101, temperature 98.2 F (36.8 C), resp. rate 13, height 5\' 2"  (1.575 m), weight (!) 137.4 kg, SpO2 98%. Physical Exam Vitals reviewed.  Constitutional:      Appearance: She is obese.  HENT:     Head: Normocephalic.     Nose: Nose normal.  Eyes:     Extraocular Movements: Extraocular movements intact.  Cardiovascular:     Rate and Rhythm: Normal rate.  Pulmonary:     Effort: Pulmonary effort is normal.  Abdominal:     General: There is no distension.     Palpations: Abdomen is soft.     Tenderness: There is no  abdominal tenderness.  Musculoskeletal:        General: Swelling present.  Skin:    General: Skin is warm.  Neurological:     General: No focal deficit present.     Mental Status: She is alert and oriented to person, place, and time.  Psychiatric:        Mood and Affect: Mood normal.        Behavior: Behavior normal.     Results: Results for orders placed or performed during the hospital encounter of 09/03/22 (from the past 48 hour(s))  CBG monitoring, ED     Status: Abnormal   Collection Time: 09/03/22  8:22 PM  Result Value Ref Range   Glucose-Capillary 126 (H) 70 - 99 mg/dL    Comment: Glucose reference range applies only to samples taken after fasting for at least 8 hours.  Resp panel by RT-PCR (RSV, Flu A&B, Covid) Anterior Nasal Swab     Status: None   Collection Time: 09/03/22  8:25 PM   Specimen: Anterior Nasal Swab  Result Value Ref Range   SARS Coronavirus 2 by RT PCR NEGATIVE NEGATIVE    Comment: (NOTE) SARS-CoV-2 target nucleic acids are NOT DETECTED.  The SARS-CoV-2 RNA is generally detectable in upper respiratory specimens during the acute phase of infection. The lowest concentration of SARS-CoV-2 viral copies this assay can detect is 138 copies/mL. A negative result does not preclude SARS-Cov-2 infection and should not be used as the sole basis for treatment or other patient management decisions. A negative result may occur with  improper specimen collection/handling, submission of specimen other than nasopharyngeal swab, presence of viral mutation(s) within the areas targeted by this assay, and inadequate number of viral copies(<138 copies/mL). A negative result must be combined with clinical observations, patient history, and epidemiological information. The expected result is Negative.  Fact Sheet for Patients:  BloggerCourse.com  Fact Sheet for Healthcare Providers:  SeriousBroker.it  This test is no t  yet approved or cleared by  the [employer] and  has been authorized for detection and/or diagnosis of SARS-CoV-2 by FDA under an Emergency Use Authorization (EUA). This EUA will remain  in effect (meaning this test can be used) for the duration of the COVID-19 declaration under Section 564(b)(1) of the Act, 21 U.S.C.section 360bbb-3(b)(1), unless the authorization is terminated  or revoked sooner.       Influenza A by PCR NEGATIVE NEGATIVE   Influenza B by PCR NEGATIVE NEGATIVE    Comment: (NOTE) The Xpert Xpress SARS-CoV-2/FLU/RSV plus assay is intended as an aid in the diagnosis of influenza from Nasopharyngeal swab specimens and should not be used as a sole basis for treatment. Nasal washings and aspirates are unacceptable for Xpert Xpress SARS-CoV-2/FLU/RSV testing.  Fact Sheet for Patients: BloggerCourse.com  Fact Sheet for Healthcare Providers: SeriousBroker.it  This test is not yet approved or cleared by the [place] FDA and has been authorized for detection and/or diagnosis of SARS-CoV-2 by FDA under an Emergency Use Authorization (EUA). This EUA will remain in effect (meaning this test can be used) for the duration of the COVID-19 declaration under Section 564(b)(1) of the Act, 21 U.S.C. section 360bbb-3(b)(1), unless the authorization is terminated or revoked.     Resp Syncytial Virus by PCR NEGATIVE NEGATIVE    Comment: (NOTE) Fact Sheet for Patients: BloggerCourse.com  Fact Sheet for Healthcare Providers: SeriousBroker.it  This test is not yet approved or cleared by the [place] FDA and has been authorized for detection and/or diagnosis of SARS-CoV-2 by FDA under an Emergency Use Authorization (EUA). This EUA will remain in effect (meaning this test can be used) for the duration of the COVID-19 declaration under Section 564(b)(1) of the Act, 21  U.S.C. section 360bbb-3(b)(1), unless the authorization is terminated or revoked.  Performed at [facility], [address] [place], [place] [address]   Lactic acid, plasma     Status: Abnormal   Collection Time: 09/03/22  8:26 PM  Result Value Ref Range   Lactic Acid, Venous 2.5 (HH) 0.5 - 1.9 mmol/L    Comment: CRITICAL RESULT CALLED TO, READ BACK BY AND VERIFIED WITH Lockie Pares AT 2101 ON 7.28.24 BY ISLEY,B Performed at [facility], [address] [place], [place] [address]   Comprehensive metabolic panel     Status: Abnormal   Collection Time: 09/03/22  8:26 PM  Result Value Ref Range   Sodium 128 (L) 135 - 145 mmol/L   Potassium 3.4 (L) 3.5 - 5.1 mmol/L   Chloride 92 (L) 98 - 111 mmol/L   CO2 25 22 - 32 mmol/L   Glucose, Bld 145 (H) 70 - 99 mg/dL    Comment: Glucose reference range applies only to samples taken after fasting for at least 8 hours.   BUN 52 (H) 8 - 23 mg/dL   Creatinine, Ser 8.29 (H) 0.44 - 1.00 mg/dL   Calcium 8.5 (L) 8.9 - 10.3 mg/dL   Total Protein 6.6 6.5 - 8.1 g/dL   Albumin 3.4 (L) 3.5 - 5.0 g/dL   AST 54 (H) 15 - 41 U/L   ALT 18 0 - 44 U/L   Alkaline Phosphatase 43 38 - 126 U/L   Total Bilirubin 1.3 (H) 0.3 - 1.2 mg/dL   GFR, Estimated 15 (L) >60 mL/min    Comment: (NOTE) Calculated using the CKD-EPI Creatinine Equation (2021)    Anion gap 11 5 - 15    Comment: Performed at [facility], [address] [place], [place] [address]  CBC with Differential     Status: Abnormal   Collection Time: 09/03/22  8:26 PM  Result Value Ref Range   WBC 11.5 (H) 4.0 - 10.5 K/uL   RBC 3.15 (L) 3.87 - 5.11 MIL/uL   Hemoglobin 9.5 (L) 12.0 - 15.0 g/dL   HCT 60.4 (L) 54.0 - 98.1 %   MCV 94.0 80.0 - 100.0 fL   MCH 30.2 26.0 - 34.0 pg   MCHC 32.1 30.0 - 36.0 g/dL   RDW 19.1 47.8 - 29.5 %   Platelets 217 150 - 400 K/uL   nRBC 0.3 (H) 0.0 - 0.2 %   Neutrophils Relative % 63 %   Neutro Abs 7.8 (H) 1.7 - 7.7 K/uL   Band Neutrophils 5 %   Lymphocytes Relative 18 %    Lymphs Abs 2.1 0.7 - 4.0 K/uL   Monocytes Relative 12 %   Monocytes Absolute 1.4 (H) 0.1 - 1.0 K/uL   Eosinophils Relative 0 %   Eosinophils Absolute 0.0 0.0 - 0.5 K/uL   Basophils Relative 0 %   Basophils Absolute 0.0 0.0 - 0.1 K/uL   WBC Morphology Mild Left Shift (1-5% metas, occ myelo)    RBC Morphology MORPHOLOGY UNREMARKABLE    Smear Review MORPHOLOGY UNREMARKABLE    Metamyelocytes Relative 2 %   Abs Immature Granulocytes 0.20 (H) 0.00 - 0.07 K/uL    Comment: Performed at [facility], [address], [place], [place] [address]  Protime-INR     Status: Abnormal   Collection Time: 09/03/22  8:26 PM  Result Value Ref Range   Prothrombin Time 15.5 (H) 11.4 - 15.2 seconds   INR 1.2 0.8 - 1.2    Comment: (NOTE) INR goal varies based on device and disease states. Performed at [facility], [address], [place], [place] [address]   APTT     Status: None   Collection Time: 09/03/22  8:26 PM  Result Value Ref Range   aPTT 24 24 - 36 seconds    Comment: Performed at [facility], [address] [place], [place] [address]  Blood Culture (routine x 2)     Status: None (Preliminary result)   Collection Time: 09/03/22  8:53 PM   Specimen: Left Antecubital; Blood  Result Value Ref Range   Specimen Description LEFT ANTECUBITAL    Special Requests      BOTTLES DRAWN AEROBIC AND ANAEROBIC Blood Culture results may not be optimal due to an excessive volume of blood received in culture bottles   Culture      NO GROWTH 2 DAYS Performed at [facility], [address] [place], [place] [address]    Report Status PENDING   Blood Culture (routine x 2)     Status: None (Preliminary result)   Collection Time: 09/03/22  9:02 PM   Specimen: Right Antecubital; Blood  Result Value Ref Range   Specimen Description RIGHT ANTECUBITAL    Special Requests      BOTTLES DRAWN AEROBIC AND ANAEROBIC Blood Culture results may not be optimal due to an excessive volume of blood received in culture  bottles   Culture      NO GROWTH 2 DAYS Performed at [facility], [address], [place], [place] [address]    Report Status PENDING   Lactic acid, plasma     Status: None   Collection Time: 09/03/22 10:21 PM  Result Value Ref Range   Lactic Acid, Venous 1.4 0.5 - 1.9 mmol/L    Comment: Performed  at [facility], [address], [place], [place] [address]  MRSA Next Gen by PCR, Nasal     Status: None   Collection Time: 09/04/22 12:00 AM  Result Value Ref Range   MRSA by PCR Next Gen NOT DETECTED NOT DETECTED    Comment: (NOTE) The GeneXpert MRSA Assay (FDA approved for NASAL specimens only), is one component of a comprehensive MRSA colonization surveillance program. It is not intended to diagnose MRSA infection nor to guide or monitor treatment for MRSA infections. Test performance is not FDA approved in patients less than 58 years old. Performed at [facility], [address], [place], [place] [address]   Urinalysis, w/ Reflex to Culture (Infection Suspected) -Urine, Clean Catch     Status: Abnormal   Collection Time: 09/04/22  2:00 AM  Result Value Ref Range   Specimen Source URINE, CLEAN CATCH    Color, Urine YELLOW YELLOW   APPearance CLOUDY (A) CLEAR   Specific Gravity, Urine 1.005 1.005 - 1.030   pH 6.0 5.0 - 8.0   Glucose, UA NEGATIVE NEGATIVE mg/dL   Hgb urine dipstick MODERATE (A) NEGATIVE   Bilirubin Urine NEGATIVE NEGATIVE   Ketones, ur NEGATIVE NEGATIVE mg/dL   Protein, ur 30 (A) NEGATIVE mg/dL   Nitrite NEGATIVE NEGATIVE   Leukocytes,Ua LARGE (A) NEGATIVE   RBC / HPF 21-50 0 - 5 RBC/hpf   WBC, UA >50 0 - 5 WBC/hpf    Comment:        Reflex urine culture not performed if WBC <=10, OR if Squamous epithelial cells >5. If Squamous epithelial cells >5 suggest recollection.    Bacteria, UA FEW (A) NONE SEEN   Squamous Epithelial / HPF 11-20 0 - 5 /HPF   WBC Clumps PRESENT     Comment: Performed at [facility], [address] [place], [place] [address]   Sodium, urine, random     Status: None   Collection Time: 09/04/22  2:00 AM  Result Value Ref Range   Sodium, Ur 22 mmol/L    Comment: Performed at [facility], [address], [place], [place] [address]  Strep pneumoniae urinary antigen     Status: Abnormal   Collection Time: 09/04/22  3:09 AM  Result Value Ref Range   Strep Pneumo Urinary Antigen POSITIVE (A) NEGATIVE    Comment: Performed at [facility], [address], [place], [place] [address]  Osmolality, urine     Status: Abnormal   Collection Time: 09/04/22  3:09 AM  Result Value Ref Range   Osmolality, Ur 189 (L) 300 - 900 mOsm/kg    Comment: Performed at [facility], [address] [place], [place] [address]  Comprehensive metabolic panel     Status: Abnormal   Collection Time: 09/04/22  4:49 AM  Result Value Ref Range   Sodium 131 (L) 135 - 145 mmol/L   Potassium 3.6 3.5 - 5.1 mmol/L   Chloride 96 (L) 98 - 111 mmol/L   CO2 25 22 - 32 mmol/L   Glucose, Bld 140 (H) 70 - 99 mg/dL    Comment: Glucose reference range applies only to samples taken after fasting for at least 8 hours.   BUN 46 (H) 8 - 23 mg/dL   Creatinine, Ser 5.78 (H) 0.44 - 1.00 mg/dL   Calcium 8.1 (L) 8.9 - 10.3 mg/dL   Total Protein 6.1 (L) 6.5 - 8.1 g/dL   Albumin 2.9 (L) 3.5 - 5.0 g/dL   AST 97 (H)  15 - 41 U/L   ALT 27 0 - 44 U/L   Alkaline Phosphatase 45 38 - 126 U/L   Total Bilirubin 1.0 0.3 - 1.2 mg/dL   GFR, Estimated 17 (L) >60 mL/min    Comment: (NOTE) Calculated using the CKD-EPI Creatinine Equation (2021)    Anion gap 10 5 - 15    Comment: Performed at [facility], [address] [place], [place] [address]  CBC     Status: Abnormal   Collection Time: 09/04/22  4:49 AM  Result Value Ref Range   WBC 9.3 4.0 - 10.5 K/uL   RBC 2.93 (L) 3.87 - 5.11 MIL/uL   Hemoglobin 8.8 (L) 12.0 - 15.0 g/dL   HCT 73.7 (L) 10.6 - 26.9 %   MCV 93.2 80.0 - 100.0 fL   MCH 30.0 26.0 - 34.0 pg   MCHC 32.2 30.0 - 36.0 g/dL   RDW 48.5 46.2 -  70.3 %   Platelets 198 150 - 400 K/uL   nRBC 0.0 0.0 - 0.2 %    Comment: Performed at [facility], [address], [place], [place] [address]  Magnesium     Status: None   Collection Time: 09/04/22  4:49 AM  Result Value Ref Range   Magnesium 1.8 1.7 - 2.4 mg/dL    Comment: Performed at [facility], [address], [place], [place] [address]  Phosphorus     Status: None   Collection Time: 09/04/22  4:49 AM  Result Value Ref Range   Phosphorus 3.2 2.5 - 4.6 mg/dL    Comment: Performed at [facility], [address], [place], [place] [address]  HIV Antibody (routine testing w rflx)     Status: None   Collection Time: 09/04/22  4:49 AM  Result Value Ref Range   HIV Screen 4th Generation wRfx Non Reactive Non Reactive    Comment: Performed at [facility], [address], [place], [place] [address]  Osmolality     Status: None   Collection Time: 09/04/22  4:49 AM  Result Value Ref Range   Osmolality 292 275 - 295 mOsm/kg    Comment: Performed at [facility], [address] [place], [place] [address]  Procalcitonin     Status: None   Collection Time: 09/04/22  4:49 AM  Result Value Ref Range   Procalcitonin 32.68 ng/mL    Comment:        Interpretation: PCT >= 10 ng/mL: Important systemic inflammatory response, almost exclusively due to severe bacterial sepsis or septic shock. (NOTE)       Sepsis PCT Algorithm           Lower Respiratory Tract                                      Infection PCT Algorithm    ----------------------------     ----------------------------         PCT < 0.25 ng/mL                PCT < 0.10 ng/mL          Strongly encourage             Strongly discourage   discontinuation of antibiotics    initiation of antibiotics    ----------------------------     -----------------------------       PCT 0.25 - 0.50 ng/mL  PCT 0.10 - 0.25 ng/mL               OR       >80% decrease in PCT            Discourage initiation of                                             antibiotics      Encourage discontinuation           of antibiotics    ----------------------------     -----------------------------         PCT >= 0.50 ng/mL              PCT 0.26 - 0.50 ng/mL                AND       <80% decrease in PCT             Encourage initiation of                                             antibiotics       Encourage continuation           of antibiotics    ----------------------------     -----------------------------        PCT >= 0.50 ng/mL                  PCT > 0.50 ng/mL               AND         increase in PCT                  Strongly encourage                                      initiation of antibiotics    Strongly encourage escalation           of antibiotics                                     -----------------------------                                           PCT <= 0.25 ng/mL                                                 OR                                        > 80% decrease in PCT  Discontinue / Do not initiate                                             antibiotics  Performed at [facility], [address] [place], [place] [address]   Hemoglobin A1c     Status: Abnormal   Collection Time: 09/04/22  4:49 AM  Result Value Ref Range   Hgb A1c MFr Bld 6.2 (H) 4.8 - 5.6 %    Comment: (NOTE)         Prediabetes: 5.7 - 6.4         Diabetes: >6.4         Glycemic control for adults with diabetes: <7.0    Mean Plasma Glucose 131 mg/dL    Comment: (NOTE) Performed At: [facility] [address] [place], [place] [id number] Jolene Schimke MD [id number]   Glucose, capillary     Status: Abnormal   Collection Time: 09/04/22  7:31 AM  Result Value Ref Range   Glucose-Capillary 124 (H) 70 - 99 mg/dL    Comment: Glucose reference range applies only to samples taken after fasting for at least 8 hours.   Comment 1 Notify RN    Comment 2 Document in Chart    Basic metabolic panel     Status: Abnormal   Collection Time: 09/04/22 11:02 AM  Result Value Ref Range   Sodium 127 (L) 135 - 145 mmol/L   Potassium 3.3 (L) 3.5 - 5.1 mmol/L   Chloride 97 (L) 98 - 111 mmol/L   CO2 23 22 - 32 mmol/L   Glucose, Bld 131 (H) 70 - 99 mg/dL    Comment: Glucose reference range applies only to samples taken after fasting for at least 8 hours.   BUN 45 (H) 8 - 23 mg/dL   Creatinine, Ser 3.08 (H) 0.44 - 1.00 mg/dL   Calcium 7.7 (L) 8.9 - 10.3 mg/dL   GFR, Estimated 17 (L) >60 mL/min    Comment: (NOTE) Calculated using the CKD-EPI Creatinine Equation (2021)    Anion gap 7 5 - 15    Comment: Performed at [facility], [address], [place], [place] [address]  Glucose, capillary     Status: Abnormal   Collection Time: 09/04/22 11:24 AM  Result Value Ref Range   Glucose-Capillary 119 (H) 70 - 99 mg/dL    Comment: Glucose reference range applies only to samples taken after fasting for at least 8 hours.  Expectorated Sputum Assessment w Gram Stain, Rflx to Resp Cult     Status: None   Collection Time: 09/04/22 11:41 AM   Specimen: Sputum  Result Value Ref Range   Specimen Description SPU    Special Requests NONE    Sputum evaluation      Sputum specimen not acceptable for testing.  Please recollect.   Gram Stain Report Called to,Read Back By and Verified WithCaralee Ates 1211 09/04/22 [facility] Performed at [facility], [address] [place], [place] [address]    Report Status 09/04/2022 FINAL   Glucose, capillary     Status: Abnormal   Collection Time: 09/04/22  4:41 PM  Result Value Ref Range   Glucose-Capillary 105 (H) 70 - 99 mg/dL    Comment: Glucose reference range applies only to samples taken after fasting for at least 8 hours.  Glucose, capillary     Status: Abnormal  Collection Time: 09/04/22  9:07 PM  Result Value Ref Range   Glucose-Capillary 108 (H) 70 - 99 mg/dL    Comment: Glucose reference range applies only to samples taken after fasting  for at least 8 hours.   Comment 1 Notify RN    Comment 2 Document in Chart   CBC     Status: Abnormal   Collection Time: 09/05/22  4:59 AM  Result Value Ref Range   WBC 8.2 4.0 - 10.5 K/uL   RBC 2.76 (L) 3.87 - 5.11 MIL/uL   Hemoglobin 8.2 (L) 12.0 - 15.0 g/dL   HCT 16.1 (L) 09.6 - 04.5 %   MCV 93.5 80.0 - 100.0 fL   MCH 29.7 26.0 - 34.0 pg   MCHC 31.8 30.0 - 36.0 g/dL   RDW 40.9 81.1 - 91.4 %   Platelets 186 150 - 400 K/uL   nRBC 0.0 0.0 - 0.2 %    Comment: Performed at [facility], [address] [place], [place] [address]  Basic metabolic panel     Status: Abnormal   Collection Time: 09/05/22  4:59 AM  Result Value Ref Range   Sodium 133 (L) 135 - 145 mmol/L   Potassium 3.4 (L) 3.5 - 5.1 mmol/L   Chloride 100 98 - 111 mmol/L   CO2 24 22 - 32 mmol/L   Glucose, Bld 130 (H) 70 - 99 mg/dL    Comment: Glucose reference range applies only to samples taken after fasting for at least 8 hours.   BUN 39 (H) 8 - 23 mg/dL   Creatinine, Ser 6.21 (H) 0.44 - 1.00 mg/dL   Calcium 8.0 (L) 8.9 - 10.3 mg/dL   GFR, Estimated 20 (L) >60 mL/min    Comment: (NOTE) Calculated using the CKD-EPI Creatinine Equation (2021)    Anion gap 9 5 - 15    Comment: Performed at [facility], [address], [place], [place] [address]  Magnesium     Status: None   Collection Time: 09/05/22  4:59 AM  Result Value Ref Range   Magnesium 1.8 1.7 - 2.4 mg/dL    Comment: Performed at [facility], [address] [place], [place] [address]  Phosphorus     Status: Abnormal   Collection Time: 09/05/22  4:59 AM  Result Value Ref Range   Phosphorus 1.9 (L) 2.5 - 4.6 mg/dL    Comment: Performed at [facility], [address], [place], [place] [address]  Glucose, capillary     Status: Abnormal   Collection Time: 09/05/22  7:28 AM  Result Value Ref Range   Glucose-Capillary 114 (H) 70 - 99 mg/dL    Comment: Glucose reference range applies only to samples taken after fasting for at least 8 hours.  Glucose,  capillary     Status: Abnormal   Collection Time: 09/05/22 11:52 AM  Result Value Ref Range   Glucose-Capillary 110 (H) 70 - 99 mg/dL    Comment: Glucose reference range applies only to samples taken after fasting for at least 8 hours.   Personally reviewed imaging and reviewed with patient and family showing images- sigmoid fistula to the vaginal cuff, some possible extraluminal gas but nothing major, not clinically showing signs of abdominal pain, could be developing an abscess but nothing to drain yet, hydroureter and hydronephrosis, see the ureter go down into the area of colon/ vagina  CT ABDOMEN PELVIS WO CONTRAST  Result Date: 09/04/2022 CLINICAL DATA:  Pelvic pain.  Suspected recto vaginal fistula. EXAM: CT  ABDOMEN AND PELVIS WITHOUT CONTRAST TECHNIQUE: Multidetector CT imaging of the abdomen and pelvis was performed following the standard protocol without IV contrast. RADIATION DOSE REDUCTION: This exam was performed according to the departmental dose-optimization program which includes automated exposure control, adjustment of the mA and/or kV according to patient size and/or use of iterative reconstruction technique. COMPARISON:  None Available. FINDINGS: Evaluation of this exam is limited in the absence of intravenous contrast as well as due to respiratory motion. Lower chest: Trace left pleural effusion. There are bibasilar linear atelectasis or scarring. The tip of a central venous line is seen at the cavoatrial junction. No intra-abdominal free air or free fluid. Hepatobiliary: Probable mild fatty liver. No biliary dilatation. The gallbladder is unremarkable. Pancreas: Unremarkable. No pancreatic ductal dilatation or surrounding inflammatory changes. Spleen: Normal in size without focal abnormality. Adrenals/Urinary Tract: The adrenal glands are unremarkable. There is moderate left hydronephrosis. There is mild left hydroureter. A transition is noted in the distal left ureter, possibly  related to adhesions. No stone identified. There is areas of cortical irregularity and scarring in the upper pole of the left kidney. There is no hydronephrosis or nephrolithiasis on the right. The right ureter is unremarkable. The urinary bladder is collapsed. Stomach/Bowel: Mild sigmoid diverticulosis. There is an area of inflammatory changes and stranding adjacent to the sigmoid colon with small pockets of extraluminal air. Findings may represent diverticulitis with focally contained perforation or developing abscess. No drainable fluid collection at this time. There is tethering of the dome of the bladder, vaginal cuff, distal left ureter and rectum consistent with adhesions. There is faint oral contrast in the region of the tethering which may be extraluminal (99/6 and axial 72/2) and may represent a fistulous track. There is no bowel obstruction. The appendix is normal. Vascular/Lymphatic: Mild aortoiliac atherosclerotic disease. The IVC is unremarkable. No portal venous gas. There is no adenopathy. Reproductive: Hysterectomy.  No adnexal masses. Other: None Musculoskeletal: Osteopenia with degenerative changes of the spine. No acute osseous pathology. IMPRESSION: 1. Tethering of the bladder dome, sigmoid colon, vaginal cough, and distal left ureter consistent with adhesion. 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 Fluoroscopic enema study may provide better evaluation. 2. Ill-defined area of inflammation and tethering in the pelvis with small pockets of air adjacent to the sigmoid colon as above. Perforated sigmoid diverticulitis is not excluded. No drainable fluid collection at this time. 3. Moderate left hydronephrosis and mild left hydroureter with a transition in the distal left ureter, possibly related to adhesions. No stone identified. 4.  Aortic Atherosclerosis (ICD10-I70.0). Electronically Signed   By: Elgie Collard M.D.    On: 09/04/2022 20:41   DG Chest Port 1 View  Result Date: 09/03/2022 CLINICAL DATA:  Possible sepsis. EXAM: PORTABLE CHEST 1 VIEW COMPARISON:  08/06/2019. FINDINGS: Heart is enlarged and the mediastinal contour is within normal limits. Lung volumes are low with mild airspace disease at the left lung base. No definite effusion or pneumothorax. A left chest port is stable in position. No acute osseous abnormality. IMPRESSION: 1. Mild airspace disease at the left lung base, possible atelectasis or infiltrate. 2. Cardiomegaly. Electronically Signed   By: Thornell Sartorius M.D.   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 She had a colonoscopy in 2021 and this is likely related  to diverticular disease. No abdominal or pelvic pain reported to me and clinically no pain with palpation. She does have the fistula and the inflammation and scarring in this area is likely causing the hydronephrosis due to the tethering of the ureter. UA had large amount of LE, no culture, I think she probably has some degree of UTI going on with the hydronephrosis and acute on chronic renal failure also could be related to the hydronephrosis. Medically she also has new onset A flutter and is being treated for PNA. The colovaginal fistula will likely require colectomy and colostomy and will also require general anesthesia, which could make all of her current issues worse and I do not think her colon or the fistula is the driving process for the sepsis.   -Urology to discuss need for any stent/ nephrostomy tube for the hydronephrosis -Can see as outpatient to discuss the colectomy, colostomy which will be a large surgery ,and will likely require urology to be involved as the patient is at risk of injury with the ureter tethering, probably would need stent, discussed that if she did well  post op could potentially get colostomy reversed in 3-6 months  -Would have to determine best place for patient to get surgery given the above issues and new issues, discussed that some people live with colovaginal fistulas when the symptom is just discharge and that this can come and go and get better and worse at different times but that she is likely to ultimately need something given the ureter being involved in the process at this time  -No acute need for colectomy/colostomy as but would address the kidney and ureter as they may need decompressed and could be adding to her septic picture   All questions were answered to the satisfaction of the patient and family.  Updated Dr. Mariea Clonts and team.    Lucretia Roers 09/05/2022, 3:17 PM

## 2022-09-05 NOTE — Plan of Care (Signed)

## 2022-09-05 NOTE — Consult Note (Addendum)
Brief consult note:  67 year old female with multiple medical comorbidities including multiple myeloma in remission, cardiac amyloidosis, hyperlipidemia, hypertension, CKD stage III, and rectovaginal fistula presented 2 days ago with concerns for severe sepsis and shock thought to be secondary to pneumonia.  She was successfully weaned off Levophed on 7/29.  She ultimately underwent CT scan of the abdomen and pelvis without contrast showed tethering of the bladder dome, sigmoid colon, and the vaginal cuff and distal left ureter consistent with adhesion and a concern for colovaginal fistula.  This resulted in moderate left hydronephrosis with a transition point in the distal left ureter.  Last fever was at 455 this morning and has since been afebrile with improved tachycardia and blood pressure.  Still borderline but tachycardic.  It is felt that her left hydronephrosis/obstruction may be contributing some to her condition.  I reviewed her imaging and I would recommend IR consultation for a left-sided nephrostomy tube for decompression of the left kidney.  Complicated anatomy with the tethering/adhesion and good possibility of stent failing to relieve the obstruction adequately.

## 2022-09-05 NOTE — Progress Notes (Signed)
Physical Therapy Treatment Patient Details Name: Kelli Hurley MRN: [id number] DOB: 1955/05/17 Today's Date: 09/05/2022   History of Present Illness ESMA KOOI is a 67 y.o. female with medical history significant of hypertension, hyperlipidemia, multiple myeloma in remission with no evidence of cardiac amyloidosis per cardiac MRI in 2021, OSA on CPAP, CKD stage III who presents to the emergency department from home via EMS due to generalized weakness which started today and has progressively worsened throughout the day.  She endorsed 2 episodes of fall today which was described as sliding to the ground while walking due to weakness, she denies any injury during the falls.  Patient states that she wanted to get discharged today, but was unable to due to the worsening generalized weakness.  She denies chest pain, shortness of breath, cough, diarrhea or abdominal pain.  EMS was activated and patient was sent to the ED for further evaluation and management.    PT Comments  Patient presents up in chair (assisted by nursing staff) and agreeable for therapy.  Patient demonstrates good return for completing BLE ROM/strengthening exercises with verbal cues, strained labored movement for completing sit to stands due to BLE weakness, able to take a few steps forward/backwards before having to sit due to c/o fatigue, bilateral knee pain and generalized weakness.  Patient tolerated staying up in chair after therapy with her daughter present in room - RN aware.  Patient will benefit from continued skilled physical therapy in hospital and recommended venue below to increase strength, balance, endurance for safe ADLs and gait.       If plan is discharge home, recommend the following: A lot of help with walking and/or transfers;A little help with bathing/dressing/bathroom;Assistance with cooking/housework;Assist for transportation   Can travel by private vehicle     Yes  Equipment Recommendations   None recommended by PT    Recommendations for Other Services       Precautions / Restrictions Precautions Precautions: Fall Restrictions Weight Bearing Restrictions: No     Mobility  Bed Mobility               General bed mobility comments: Patient presents up in chair (assisted by nursing staff)    Transfers Overall transfer level: Needs assistance Equipment used: Rolling walker (2 wheels) Transfers: Sit to/from Stand Sit to Stand: Mod assist, Min assist   Step pivot transfers: Mod assist       General transfer comment: requiried increased time with strained labored movement    Ambulation/Gait Ambulation/Gait assistance: Mod assist Gait Distance (Feet): 8 Feet Assistive device: Rolling walker (2 wheels) Gait Pattern/deviations: Step-to pattern, Decreased step length - right, Decreased step length - left, Decreased stride length, Trunk flexed Gait velocity: slow     General Gait Details: limited to a few steps forward/backwards before having to sit due to c/o fatigue and BLE weakness   Stairs             Wheelchair Mobility     Tilt Bed    Modified Rankin (Stroke Patients Only)       Balance Overall balance assessment: Needs assistance Sitting-balance support: Feet supported, No upper extremity supported Sitting balance-Leahy Scale: Fair Sitting balance - Comments: fair/good seated in chair   Standing balance support: Reliant on assistive device for balance, During functional activity, Bilateral upper extremity supported Standing balance-Leahy Scale: Poor Standing balance comment: fair/poor using RW  Cognition Arousal/Alertness: Awake/alert Behavior During Therapy: WFL for tasks assessed/performed Overall Cognitive Status: Within Functional Limits for tasks assessed                                          Exercises General Exercises - Lower Extremity Long Arc Quad: Seated,  AROM, Strengthening, Both, 10 reps Hip Flexion/Marching: Seated, AROM, Strengthening, Both, 10 reps Toe Raises: Seated, AROM, Strengthening, Both, 10 reps Heel Raises: Seated, AROM, Strengthening, Both, 10 reps    General Comments        Pertinent Vitals/Pain Pain Assessment Pain Assessment: 0-10 Pain Score: 7  Pain Location: bilateral knees Pain Descriptors / Indicators: Sore, Discomfort Pain Intervention(s): Limited activity within patient's tolerance, Monitored during session, Repositioned    Home Living                          Prior Function            PT Goals (current goals can now be found in the care plan section) Acute Rehab PT Goals Patient Stated Goal: Return home with assist of family PT Goal Formulation: With patient Time For Goal Achievement: 09/18/22 Potential to Achieve Goals: Good Progress towards PT goals: Progressing toward goals    Frequency    Min 3X/week      PT Plan Current plan remains appropriate    Co-evaluation              AM-PAC PT "6 Clicks" Mobility   Outcome Measure  Help needed turning from your back to your side while in a flat bed without using bedrails?: A Little Help needed moving from lying on your back to sitting on the side of a flat bed without using bedrails?: A Little Help needed moving to and from a bed to a chair (including a wheelchair)?: A Little Help needed standing up from a chair using your arms (e.g., wheelchair or bedside chair)?: A Little Help needed to walk in hospital room?: A Little Help needed climbing 3-5 steps with a railing? : A Lot 6 Click Score: 17    End of Session   Activity Tolerance: Patient tolerated treatment well;Patient limited by fatigue Patient left: in chair;with call bell/phone within reach;with family/visitor present Nurse Communication: Mobility status PT Visit Diagnosis: Unsteadiness on feet (R26.81);Other abnormalities of gait and mobility (R26.89);Muscle weakness  (generalized) (M62.81);History of falling (Z91.81)     Time: 1324-4010 PT Time Calculation (min) (ACUTE ONLY): 14 min  Charges:    $Therapeutic Activity: 8-22 mins PT General Charges $$ ACUTE PT VISIT: 1 Visit                     2:34 PM, 09/05/22 Ocie Bob, MPT Physical Therapist with [facility] [phone] office [phone] mobile phone

## 2022-09-05 NOTE — Progress Notes (Signed)
Progress Note RN called due to patient going into atrial flutter.  EKG was reviewed and showed atrial flutter with variable A-V block with premature ventricular or aberrantly conducted complexes with prolonged QTc of . She was started on IV amiodarone and therapeutic Lovenox  Potassium was 3.4, this was replenished Magnesium was 1.8, this was replenished to obtain a goal of 2.0 Continue telemetry and consider weaning patient off amiodarone as tolerated Consider cardiology consult if patient continues to be in atrial flutter (patient does not have prior history of atrial flutter).  Total time:  18 minutes This includes time reviewing the chart including progress notes, labs, EKGs, taking medical decisions, ordering labs and documenting findings.  Please refer to admission H&P and progress notes for details regarding the care of this patient

## 2022-09-05 NOTE — Progress Notes (Addendum)
PROGRESS NOTE  Kelli Hurley, is a 67 y.o. female, DOB - 1955/11/07, LOV:[id number]  Admit date - 09/03/2022   Admitting Physician Frankey Shown, DO  Outpatient Primary MD for the patient is [facility]  LOS - 2  Chief Complaint  Patient presents with   Weakness        Brief Narrative:  68 y.o. 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  This resulted in moderate left hydronephrosis with a transition point in the distal left ureter.   It is felt that her left hydronephrosis/obstruction may be contributing some to her condition. -09/05/22 -Fevers and sepsis physiology persist--Tmax 103.1 T-current 100.5, tachycardia and tachypnea persist, WBC normalized -Became hypotensive overnight and placed back on Levophed again -Successfully weaned off Levophed--later this afternoon -CT findings discussed with Dr. Henreitta Leber please see general surgery consult note dated 09/05/2022 -CT findings discussed with gynecologist Dr. Hurley Cisco  outpatient follow-up down the road after resolution of urological and  Infectious issues -CT findings discussed with urologist Dr. Ronne Binning and Dr. Alvester Morin -Discussed with Dr Elby Showers and Dr Odis Luster (IR) Plan is for IR to place  left-sided nephrostomy tube for decompression of the left kidney on 09/06/22 (round trip to [facility] and back here) Complicated anatomy with the tethering/adhesion and good possibility of stent failing to relieve the obstruction adequately.  2)HypoNatremia/hypokalemia/hypochloremia/hypophosphatemia --- avoid excessive free water -Replete lites and hydrate  3)AKI----acute kidney injury on CKD stage -IV -Creatinine on admission 3.34 -Baseline creatinine recently around 2 -Creatinine trended down with hydration -Bicarb and anion gap WNL - renally adjust medications, avoid nephrotoxic agents / dehydration  / hypotension  4)HTN--continue to hold BP meds due to sepsis with septic shock  5)Multiple myeloma Patient has relapsed IgG kappa multiple myeloma with high risk features Patient's is on pomalidomide 4 mg 3 weeks on/1 week off. Continue dexamethasone 20 mg weekly  She follows with Dr. Ellin Saba with last visit being on 06/28/2022  6)Morbid Obesity/OSA -Low calorie diet, portion control and increase physical activity discussed with patient -Body mass index is 55.4 kg/m. -Continue Ozempic for weight loss -CPAP nightly encouraged  7) obstructive uropathy----CT findings discussed with Dr. Henreitta Leber please see general surgery consult note dated 09/05/2022 -CT findings discussed with gynecologist Dr. Hurley Cisco outpatient follow-up down the road after resolution of urological and  Infectious issues -CT findings discussed with urologist Dr. Ronne Binning and Dr. Alvester Morin -Discussed with Dr Elby Showers and Dr Odis Luster (IR) Plan is for IR to place  left-sided nephrostomy tube for decompression of the left kidney on 09/06/22 (round trip to [facility] and back here)  8) new onset atrial  flutter---paroxysmal atrial flutter--- back in  sinus rhythm -at this time currently on IV amiodarone drip, plan to transition to amiodarone orally on 09/06/2022 -Continue Lovenox Recent TSH is 0.6 -Get echo -Keep potassium close to 4 magnesium close to 2, replace phosphorus  9)Social/Ethics--discussed with patient and daughter Cristal Ford at bedside -She is a full code  CRITICAL CARE Performed by: Shon Hale  Total critical care time: 48 minutes  Critical care time was exclusive of separately billable procedures and treating other patients. - Severe Sepsis with septic shock and persistent hypotension requiring Levophed for pressure support trying to wean off Levophed Critical care was necessary to treat or prevent imminent or life-threatening deterioration.  Critical care was time spent personally by me on the following activities: development of treatment plan with patient and/or surrogate as well as nursing, discussions with consultants, evaluation of patient's response to treatment, examination of patient, obtaining history from patient or surrogate, ordering and performing treatments and interventions, ordering and review of laboratory studies, ordering and review of radiographic studies, pulse oximetry and re-evaluation of patient's condition.  Status is: Inpatient   Disposition: The patient is from: Home              Anticipated d/c is to: Home              Anticipated d/c date is: 2 days              Patient currently is not medically stable to d/c. Barriers: Not Clinically Stable-   Code Status :  -  Code Status: Full Code   Family Communication:    (patient is alert, awake and coherent)  Discussed with daughter at bedside  DVT Prophylaxis  :   - SCDs  SCDs Start: 09/03/22 2302  Lab Results  Component Value Date   PLT 186 09/05/2022   Inpatient Medications  Scheduled Meds:  Chlorhexidine Gluconate Cloth  6 each Topical Daily   dextromethorphan-guaiFENesin  1  tablet Oral BID   enoxaparin (LOVENOX) injection  135 mg Subcutaneous Q24H   feeding supplement  237 mL Oral BID BM   insulin aspart  0-9 Units Subcutaneous TID WC   Continuous Infusions:  sodium chloride 100 mL/hr at 09/05/22 1815   amiodarone 30 mg/hr (09/05/22 1815)   azithromycin 500 mg (09/04/22 2040)   cefTRIAXone (ROCEPHIN)  IV 2 g (09/04/22 1940)   norepinephrine (LEVOPHED) Adult infusion Stopped (09/05/22 1231)   PRN Meds:.acetaminophen **OR** acetaminophen, ondansetron **OR** ondansetron (ZOFRAN) IV, mouth rinse   Anti-infectives (From admission, onward)    Start     Dose/Rate Route Frequency Ordered Stop   09/03/22 2115  azithromycin (ZITHROMAX) 500 mg in sodium chloride 0.9 % 250 mL IVPB        500 mg 250 mL/hr over 60 Minutes Intravenous Every 24 hours 09/03/22 2109     09/03/22 2030  cefTRIAXone (ROCEPHIN) 2 g in sodium chloride 0.9 % 100 mL IVPB        2 g 200 mL/hr over 30 Minutes Intravenous Every 24 hours 09/03/22 2027 09/10/22 2029      Subjective: Chauncy Passy today has no fevers, no emesis,  No chest pain,   - Tmax 103.1 T-current 100.5 Patient's daughter Cristal Ford is at bedside, questions answered -Rate control improving on IV amiodarone drip No dysuria  Objective: Vitals:   09/05/22 1700 09/05/22 1715 09/05/22 1730 09/05/22 1745  BP: 106/67 (!) 106/59 114/67 102/69  Pulse: 100 94 95 94  Resp: 20 (!) 23 (!) 21 (!) 22  Temp:  TempSrc:      SpO2: 97% 93% 97% 96%  Weight:      Height:        Intake/Output Summary (Last 24 hours) at 09/05/2022 1816 Last data filed at 09/05/2022 1815 Gross per 24 hour  Intake 3895.05 ml  Output 2 ml  Net 3893.05 ml   Filed Weights   09/03/22 2016 09/04/22 0012 09/05/22 0455  Weight: 134.4 kg (!) 137.2 kg (!) 137.4 kg   Physical Exam  Gen:- Awake Alert,  in no apparent distress  HEENT:- [place].AT, No sclera icterus Neck-Supple Neck,No JVD,.  Lungs-  CTAB , fair symmetrical air movement CV- S1, S2  normal, irregular, left chest wall Port-A-Cath Abd-  +ve B.Sounds, Abd Soft, No tenderness, increased truncal adiposity, no CVA area tenderness Extremity/Skin:- No  edema, pedal pulses present  Psych-affect is appropriate, oriented x3 Neuro-no new focal deficits, no tremors  Data Reviewed: I have personally reviewed following labs and imaging studies  CBC: Recent Labs  Lab 09/03/22 2026 09/04/22 0449 09/05/22 0459  WBC 11.5* 9.3 8.2  NEUTROABS 7.8*  --   --   HGB 9.5* 8.8* 8.2*  HCT 29.6* 27.3* 25.8*  MCV 94.0 93.2 93.5  PLT 217 198 186   Basic Metabolic Panel: Recent Labs  Lab 09/03/22 2026 09/04/22 0449 09/04/22 1102 09/05/22 0459  NA 128* 131* 127* 133*  K 3.4* 3.6 3.3* 3.4*  CL 92* 96* 97* 100  CO2 25 25 23 24   GLUCOSE 145* 140* 131* 130*  BUN 52* 46* 45* 39*  CREATININE 3.34* 2.91* 2.93* 2.62*  CALCIUM 8.5* 8.1* 7.7* 8.0*  MG  --  1.8  --  1.8  PHOS  --  3.2  --  1.9*   GFR: Estimated Creatinine Clearance: 28.7 mL/min (A) (by C-G formula based on SCr of 2.62 mg/dL (H)). Liver Function Tests: Recent Labs  Lab 09/03/22 2026 09/04/22 0449  AST 54* 97*  ALT 18 27  ALKPHOS 43 45  BILITOT 1.3* 1.0  PROT 6.6 6.1*  ALBUMIN 3.4* 2.9*   Recent Results (from the past 240 hour(s))  Resp panel by RT-PCR (RSV, Flu A&B, Covid) Anterior Nasal Swab     Status: None   Collection Time: 09/03/22  8:25 PM   Specimen: Anterior Nasal Swab  Result Value Ref Range Status   SARS Coronavirus 2 by RT PCR NEGATIVE NEGATIVE Final    Comment: (NOTE) SARS-CoV-2 target nucleic acids are NOT DETECTED.  The SARS-CoV-2 RNA is generally detectable in upper respiratory specimens during the acute phase of infection. The lowest concentration of SARS-CoV-2 viral copies this assay can detect is 138 copies/mL. A negative result does not preclude SARS-Cov-2 infection and should not be used as the sole basis for treatment or other patient management decisions. A negative result may occur  with  improper specimen collection/handling, submission of specimen other than nasopharyngeal swab, presence of viral mutation(s) within the areas targeted by this assay, and inadequate number of viral copies(<138 copies/mL). A negative result must be combined with clinical observations, patient history, and epidemiological information. The expected result is Negative.  Fact Sheet for Patients:  BloggerCourse.com  Fact Sheet for Healthcare Providers:  SeriousBroker.it  This test is no t yet approved or cleared by the [place] FDA and  has been authorized for detection and/or diagnosis of SARS-CoV-2 by FDA under an Emergency Use Authorization (EUA). This EUA will remain  in effect (meaning this test can be used) for the duration of the COVID-19 declaration under  Section 564(b)(1) of the Act, 21 U.S.C.section 360bbb-3(b)(1), unless the authorization is terminated  or revoked sooner.       Influenza A by PCR NEGATIVE NEGATIVE Final   Influenza B by PCR NEGATIVE NEGATIVE Final    Comment: (NOTE) The Xpert Xpress SARS-CoV-2/FLU/RSV plus assay is intended as an aid in the diagnosis of influenza from Nasopharyngeal swab specimens and should not be used as a sole basis for treatment. Nasal washings and aspirates are unacceptable for Xpert Xpress SARS-CoV-2/FLU/RSV testing.  Fact Sheet for Patients: BloggerCourse.com  Fact Sheet for Healthcare Providers: SeriousBroker.it  This test is not yet approved or cleared by the [place] FDA and has been authorized for detection and/or diagnosis of SARS-CoV-2 by FDA under an Emergency Use Authorization (EUA). This EUA will remain in effect (meaning this test can be used) for the duration of the COVID-19 declaration under Section 564(b)(1) of the Act, 21 U.S.C. section 360bbb-3(b)(1), unless the authorization is terminated  or revoked.     Resp Syncytial Virus by PCR NEGATIVE NEGATIVE Final    Comment: (NOTE) Fact Sheet for Patients: BloggerCourse.com  Fact Sheet for Healthcare Providers: SeriousBroker.it  This test is not yet approved or cleared by the [place] FDA and has been authorized for detection and/or diagnosis of SARS-CoV-2 by FDA under an Emergency Use Authorization (EUA). This EUA will remain in effect (meaning this test can be used) for the duration of the COVID-19 declaration under Section 564(b)(1) of the Act, 21 U.S.C. section 360bbb-3(b)(1), unless the authorization is terminated or revoked.  Performed at [facility], [address] [place], [place] [address]   Blood Culture (routine x 2)     Status: None (Preliminary result)   Collection Time: 09/03/22  8:53 PM   Specimen: Left Antecubital; Blood  Result Value Ref Range Status   Specimen Description LEFT ANTECUBITAL  Final   Special Requests   Final    BOTTLES DRAWN AEROBIC AND ANAEROBIC Blood Culture results may not be optimal due to an excessive volume of blood received in culture bottles   Culture   Final    NO GROWTH 2 DAYS Performed at [facility], [address] [place], [place] [address]    Report Status PENDING  Incomplete  Blood Culture (routine x 2)     Status: None (Preliminary result)   Collection Time: 09/03/22  9:02 PM   Specimen: Right Antecubital; Blood  Result Value Ref Range Status   Specimen Description RIGHT ANTECUBITAL  Final   Special Requests   Final    BOTTLES DRAWN AEROBIC AND ANAEROBIC Blood Culture results may not be optimal due to an excessive volume of blood received in culture bottles   Culture   Final    NO GROWTH 2 DAYS Performed at [facility], [address], [place], [place] [address]    Report Status PENDING  Incomplete  MRSA Next Gen by PCR, Nasal     Status: None   Collection Time: 09/04/22 12:00 AM  Result Value Ref Range  Status   MRSA by PCR Next Gen NOT DETECTED NOT DETECTED Final    Comment: (NOTE) The GeneXpert MRSA Assay (FDA approved for NASAL specimens only), is one component of a comprehensive MRSA colonization surveillance program. It is not intended to diagnose MRSA infection nor to guide or monitor treatment for MRSA infections. Test performance is not FDA approved in patients less than 65 years old. Performed at [facility], [address] [place], [place] [address]   Expectorated  Sputum Assessment w Gram Stain, Rflx to Resp Cult     Status: None   Collection Time: 09/04/22 11:41 AM   Specimen: Sputum  Result Value Ref Range Status   Specimen Description SPU  Final   Special Requests NONE  Final   Sputum evaluation   Final    Sputum specimen not acceptable for testing.  Please recollect.   Gram Stain Report Called to,Read Back By and Verified WithCaralee Ates 1211 09/04/22 [facility] Performed at [facility], [address] [place], [place] [address]    Report Status 09/04/2022 FINAL  Final    Radiology Studies: CT ABDOMEN PELVIS WO CONTRAST  Result Date: 09/04/2022 CLINICAL DATA:  Pelvic pain.  Suspected recto vaginal fistula. EXAM: CT ABDOMEN AND PELVIS WITHOUT CONTRAST TECHNIQUE: Multidetector CT imaging of the abdomen and pelvis was performed following the standard protocol without IV contrast. RADIATION DOSE REDUCTION: This exam was performed according to the departmental dose-optimization program which includes automated exposure control, adjustment of the mA and/or kV according to patient size and/or use of iterative reconstruction technique. COMPARISON:  None Available. FINDINGS: Evaluation of this exam is limited in the absence of intravenous contrast as well as due to respiratory motion. Lower chest: Trace left pleural effusion. There are bibasilar linear atelectasis or scarring. The tip of a central venous line is seen at the cavoatrial junction. No intra-abdominal free air or free fluid.  Hepatobiliary: Probable mild fatty liver. No biliary dilatation. The gallbladder is unremarkable. Pancreas: Unremarkable. No pancreatic ductal dilatation or surrounding inflammatory changes. Spleen: Normal in size without focal abnormality. Adrenals/Urinary Tract: The adrenal glands are unremarkable. There is moderate left hydronephrosis. There is mild left hydroureter. A transition is noted in the distal left ureter, possibly related to adhesions. No stone identified. There is areas of cortical irregularity and scarring in the upper pole of the left kidney. There is no hydronephrosis or nephrolithiasis on the right. The right ureter is unremarkable. The urinary bladder is collapsed. Stomach/Bowel: Mild sigmoid diverticulosis. There is an area of inflammatory changes and stranding adjacent to the sigmoid colon with small pockets of extraluminal air. Findings may represent diverticulitis with focally contained perforation or developing abscess. No drainable fluid collection at this time. There is tethering of the dome of the bladder, vaginal cuff, distal left ureter and rectum consistent with adhesions. There is faint oral contrast in the region of the tethering which may be extraluminal (99/6 and axial 72/2) and may represent a fistulous track. There is no bowel obstruction. The appendix is normal. Vascular/Lymphatic: Mild aortoiliac atherosclerotic disease. The IVC is unremarkable. No portal venous gas. There is no adenopathy. Reproductive: Hysterectomy.  No adnexal masses. Other: None Musculoskeletal: Osteopenia with degenerative changes of the spine. No acute osseous pathology. IMPRESSION: 1. Tethering of the bladder dome, sigmoid colon, vaginal cough, and distal left ureter consistent with adhesion. 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 Fluoroscopic enema study may provide better evaluation. 2. Ill-defined area of inflammation and  tethering in the pelvis with small pockets of air adjacent to the sigmoid colon as above. Perforated sigmoid diverticulitis is not excluded. No drainable fluid collection at this time. 3. Moderate left hydronephrosis and mild left hydroureter with a transition in the distal left ureter, possibly related to adhesions. No stone identified. 4.  Aortic Atherosclerosis (ICD10-I70.0). Electronically Signed   By: Elgie Collard M.D.   On: 09/04/2022 20:41   DG Chest [facility] 1 View  Result  Date: 09/03/2022 CLINICAL DATA:  Possible sepsis. EXAM: PORTABLE CHEST 1 VIEW COMPARISON:  08/06/2019. FINDINGS: Heart is enlarged and the mediastinal contour is within normal limits. Lung volumes are low with mild airspace disease at the left lung base. No definite effusion or pneumothorax. A left chest port is stable in position. No acute osseous abnormality. IMPRESSION: 1. Mild airspace disease at the left lung base, possible atelectasis or infiltrate. 2. Cardiomegaly. Electronically Signed   By: Thornell Sartorius M.D.   On: 09/03/2022 20:52    Scheduled Meds:  Chlorhexidine Gluconate Cloth  6 each Topical Daily   dextromethorphan-guaiFENesin  1 tablet Oral BID   enoxaparin (LOVENOX) injection  135 mg Subcutaneous Q24H   feeding supplement  237 mL Oral BID BM   insulin aspart  0-9 Units Subcutaneous TID WC   Continuous Infusions:  sodium chloride 100 mL/hr at 09/05/22 1815   amiodarone 30 mg/hr (09/05/22 1815)   azithromycin 500 mg (09/04/22 2040)   cefTRIAXone (ROCEPHIN)  IV 2 g (09/04/22 1940)   norepinephrine (LEVOPHED) Adult infusion Stopped (09/05/22 1231)    LOS: 2 days   Shon Hale M.D on 09/05/2022 at 6:16 PM  Go to [email or web address] - for contact info  [facility] - Office  [phone]  If 7PM-7AM, please contact night-coverage [email or web address] 09/05/2022, 6:16 PM

## 2022-09-06 ENCOUNTER — Inpatient Hospital Stay (HOSPITAL_COMMUNITY): Payer: 59

## 2022-09-06 DIAGNOSIS — R9431 Abnormal electrocardiogram [ECG] [EKG]: Secondary | ICD-10-CM

## 2022-09-06 DIAGNOSIS — J189 Pneumonia, unspecified organism: Secondary | ICD-10-CM | POA: Diagnosis not present

## 2022-09-06 DIAGNOSIS — A419 Sepsis, unspecified organism: Secondary | ICD-10-CM | POA: Diagnosis not present

## 2022-09-06 LAB — GLUCOSE, CAPILLARY
Glucose-Capillary: 83 mg/dL (ref 70–99)
Glucose-Capillary: 83 mg/dL (ref 70–99)
Glucose-Capillary: 83 mg/dL (ref 70–99)
Glucose-Capillary: 90 mg/dL (ref 70–99)

## 2022-09-06 LAB — LEGIONELLA PNEUMOPHILA SEROGP 1 UR AG: L. pneumophila Serogp 1 Ur Ag: NEGATIVE

## 2022-09-06 NOTE — Plan of Care (Signed)
IR was requested for L PCN placement, case reviewed by Dr. Elby Showers and Dr. Grace Isaac yesterday and plan was to proceed with the procedure at [facility] IR and patient will be transfer to [facility] after.   However,  patient received Lovenox 135 mg this morning at 0444 hrs.  Patient currently has normal WBCc, stable VS, does not meet SIRS criteria.  Hgb has been trending down, 7.4 this morning.   Discussed with Dr. Grace Isaac, PCN placement for this patient will be challenging due to her body habitus which increases risk of bleeding port procedure. Will have to schedule her for tomorrow.   RN/MD notified, made npo at midnight.  Asked MD to switch from Lovenox to heparin sq/infusion if AC/AP needed. - Both needs to be held for 6 hours.  Lovenox for tomorrow AM MAR held, can be resumed 24 hours after the procedure.  [facility] IR will coordinate with [facility] team for procedure time tomorrow.  Formal consult to follow tomorrow.   The procedure is tentatively scheduled for tomorrow pending IR schedule.  Please call IR for questions and concerns.    Lynann Bologna Maniyah Moller PA-C 09/06/2022 8:17 AM

## 2022-09-06 NOTE — Progress Notes (Signed)
Called and set up transport with [facility] for tomorrow for pt. To be transported to [facility] (per Delton Coombes, RN) for procedure on 09/07/2022 for pt. To arrive at 8:45am. Notified team to have IR Delton Coombes, RN) call [facility] once procedure is done for a return time. Pt. And family at bedside made aware.

## 2022-09-06 NOTE — Progress Notes (Signed)
[facility] PROGRESS NOTE  WINNI KAMSTRA (DOB: Apr 20, 1955) VHQ:[id number] PCP: [facility]  Brief Narrative: 67 y.o. female with medical history significant of multiple myeloma in remission with no evidence of cardiac amyloidosis per cardiac MRI in 2021, hysterectomy, OSA on CPAP, CKD stage III, HTN, HLD admitted on 09/03/2022 with concerns for sepsis due to pneumonia. She was found to have septic shock with concern for intraabdominal abscess related to colovesical/colovaginal fistula as well as new atrial flutter. Pressors weaned 7/29, remains stable on ceftriaxone and azithromycin. Surgery, urology, GYN consulted, recommend delayed procedures while unstable. There was left hydroureteronephrosis related to adhesions for which IR is consulted and plans percutaneous nephrostomy tube placement 8/1 after lovenox washout.   Subjective: Up in chair in good spirits, breathing better, pain minimal. No fevers subjectively but was 100.68F yesterday afternoon.   Objective: BP (!) 113/56   Pulse 83   Temp 98.2 F (36.8 C) (Oral)   Resp 11   Ht 5\' 2"  (1.575 m)   Wt (!) 141.7 kg   SpO2 97%   BMI 57.14 kg/m   Gen: No distress, obese Pulm: Clear, distant, mild crackles at left base  CV: RRR, NSR on monitor, rate in 70-80's GI: Soft, no significant tenderness or rebound or guarding, +BS, distant. Neuro: Alert and oriented. No new focal deficits. Ext: Warm, no deformities. Skin: No acute rashes, lesions or ulcers on visualized skin   Assessment & Plan: Principal Problem:   Sepsis due to pneumonia [facility]) Active Problems:   Multiple myeloma (HCC)   Morbid obesity with BMI of 50.0-59.9, adult (HCC)   Essential hypertension   OSA (obstructive sleep apnea)   Lactic acidosis   Hyponatremia   Acute kidney injury superimposed on CKD (HCC)   Hypoalbuminemia due to protein-calorie malnutrition (HCC)   Hypokalemia   Mixed hyperlipidemia   Colovaginal fistula    Hydronephrosis due to obstruction of ureter  Septic shock due to LLL pneumonia and intra-abdominal abscess in the setting of colovesicular/Vagina fistula. Liberated from pressors, normalized lactic acid. MRSA, RSV, flu, covid PCR's negative. Blood cultures NGTD, sputum culture not testable.  - +Pneumococcal urine antigen, continue ceftriaxone.  - Patient with questionable history of recto-vaginal fistula CT scan of the abdomen and pelvis without contrast showed tethering of the bladder dome, sigmoid colon, and the vaginal cuff and distal left ureter consistent with adhesion and a concern for colovaginal fistula.  - CT findings discussed with Dr. Henreitta Leber please see general surgery consult note dated 09/05/2022 - CT findings discussed with gynecologist Dr. Hurley Cisco outpatient follow-up down the road after resolution of urological and  Infectious issues - CT findings discussed with urologist Dr. Ronne Binning and Dr. Alvester Morin   Progressive normocytic anemia: Primarily suspect hemodilution and myeloma-related anemia as well as AOCKD. Unsure of likelihood of adequate marrow response.  - Recheck CBC with T&S in AM. Transfuse if grows hypotensive, develops symptoms, or hgb < 7g/dl.   Multiple myeloma: Patient has relapsed IgG kappa multiple myeloma with high risk features - While admitted, holding pomalidomide 4 mg 3 weeks on/1 week off. Continue dexamethasone 20 mg weekly  - She follows with Dr. Ellin Saba with last visit being on 06/28/2022   Morbid obesity: Body mass index is 57.14 kg/m.  - Continue semaglutide  OSA:  - CPAP qHS encouraged.     Obstructive uropathy, AKI on stage IIIb CKD: Urology feels ureteral stenting would have high failure rate. - SCr 3.34 >> 2.26 near baseline. - Avoid nephrotoxins -  NPO p MN, hold further anticoagulation. PCN 8/1 is plan for relief of left ureteral obstruction.    New onset paroxysmal atrial flutter: Back in NSR. Recent TSH 0.636. - Convert IV amiodarone  to PO if stable postprocedure - Hold lovenox as discussed above. Will restart 24 hours postprocedure per IR. Ideally would minimize time off anticoagulation in light of recently cardioverting.  - Echo pending - Keep K, Mg replete.    Hyponatremia/hypokalemia/hypochloremia/hypophosphatemia:  - Avoid excessive free water, continue monitoring and supplementing prn.   HTN: Holding BP meds in setting of septic shock.   Tyrone Nine, MD [facility] [email or web address] 09/06/2022, 6:12 PM

## 2022-09-06 NOTE — Progress Notes (Signed)
Physical Therapy Treatment Patient Details Name: Kelli Hurley MRN: [id number] DOB: [birth year] Today's Date: 09/06/2022   History of Present Illness Kelli Hurley is a 67 y.o. female with medical history significant of hypertension, hyperlipidemia, multiple myeloma in remission with no evidence of cardiac amyloidosis per cardiac MRI in 2021, OSA on CPAP, CKD stage III who presents to the emergency department from home via EMS due to generalized weakness which started today and has progressively worsened throughout the day.  She endorsed 2 episodes of fall today which was described as sliding to the ground while walking due to weakness, she denies any injury during the falls.  Patient states that she wanted to get discharged today, but was unable to due to the worsening generalized weakness.  She denies chest pain, shortness of breath, cough, diarrhea or abdominal pain.  EMS was activated and patient was sent to the ED for further evaluation and management.    PT Comments  Pt tolerated treatment well. Pt participation in activity was limited due to c/o of fatigue and bilateral LE weakness. Able to ambulate a total of 15 feet by walking forward and backward at bedside with RW and min/mod assist from therapist. Pts functional mobility is improving. Patient will benefit from continued skilled physical therapy in hospital and recommended venue below to increase strength, balance, endurance for safe ADLs and gait.    If plan is discharge home, recommend the following: A lot of help with walking and/or transfers;A little help with bathing/dressing/bathroom;Assistance with cooking/housework;Assist for transportation   Can travel by private vehicle        Equipment Recommendations  None recommended by PT    Recommendations for Other Services       Precautions / Restrictions Precautions Precautions: Fall Restrictions Weight Bearing Restrictions: No     Mobility  Bed Mobility Overal  bed mobility: Needs Assistance Bed Mobility: Sidelying to Sit   Sidelying to sit: Min assist       General bed mobility comments: Patient presents up in chair (assisted by nursing staff)    Transfers Overall transfer level: Needs assistance Equipment used: Rolling walker (2 wheels) Transfers: Sit to/from Stand Sit to Stand: Mod assist, Min assist   Step pivot transfers: Mod assist       General transfer comment: requiried increased time with strained labored movement    Ambulation/Gait Ambulation/Gait assistance: Mod assist Gait Distance (Feet): 15 Feet (Able to ambulate by taking steps forwards and backwards within rooom) Assistive device: Rolling walker (2 wheels) Gait Pattern/deviations: Step-to pattern, Decreased step length - right, Decreased step length - left, Decreased stride length, Trunk flexed Gait velocity: slow     General Gait Details: limited to steps forward/backwards at bedside before having to sit due to c/o fatigue and BLE weakness   Stairs             Wheelchair Mobility     Tilt Bed    Modified Rankin (Stroke Patients Only)       Balance Overall balance assessment: Needs assistance   Sitting balance-Leahy Scale: Fair Sitting balance - Comments: fair/good seated in chair   Standing balance support: Reliant on assistive device for balance, During functional activity, Bilateral upper extremity supported Standing balance-Leahy Scale: Poor Standing balance comment: fair/poor using RW                            Cognition Arousal/Alertness: Awake/alert Behavior During Therapy: WFL for tasks assessed/performed Overall  Cognitive Status: Within Functional Limits for tasks assessed                                          Exercises General Exercises - Lower Extremity Ankle Circles/Pumps: AROM, Seated, Strengthening, Right, Left, 10 reps Long Arc Quad: Seated, AROM, Strengthening, Both, 10 reps Hip  Flexion/Marching: Seated, AROM, Strengthening, Both, 10 reps Toe Raises: Seated, AROM, Strengthening, Both, 10 reps Heel Raises: Seated, AROM, Strengthening, Both, 10 reps    General Comments        Pertinent Vitals/Pain Pain Assessment Pain Assessment: Faces Faces Pain Scale: Hurts a little bit Pain Location: bilateral knees Pain Descriptors / Indicators: Sore, Discomfort    Home Living                          Prior Function            PT Goals (current goals can now be found in the care plan section) Acute Rehab PT Goals Patient Stated Goal: Return home with assist of family PT Goal Formulation: With patient Time For Goal Achievement: 09/18/22 Potential to Achieve Goals: Good Progress towards PT goals: Progressing toward goals    Frequency    Min 3X/week      PT Plan Current plan remains appropriate    Co-evaluation              AM-PAC PT "6 Clicks" Mobility   Outcome Measure  Help needed turning from your back to your side while in a flat bed without using bedrails?: A Little Help needed moving from lying on your back to sitting on the side of a flat bed without using bedrails?: A Little Help needed moving to and from a bed to a chair (including a wheelchair)?: A Little Help needed standing up from a chair using your arms (e.g., wheelchair or bedside chair)?: A Little Help needed to walk in hospital room?: A Little Help needed climbing 3-5 steps with a railing? : A Lot 6 Click Score: 17    End of Session   Activity Tolerance: Patient tolerated treatment well;Patient limited by fatigue Patient left: in chair;with call bell/phone within reach;with family/visitor present Nurse Communication: Mobility status PT Visit Diagnosis: Unsteadiness on feet (R26.81);Other abnormalities of gait and mobility (R26.89);Muscle weakness (generalized) (M62.81);History of falling (Z91.81)     Time: 7846-9629 PT Time Calculation (min) (ACUTE ONLY): 26  min  Charges:    $Therapeutic Activity: 8-22 mins PT General Charges $$ ACUTE PT VISIT: 1 Visit                    Danita Proud SPT [place], DPT Program

## 2022-09-06 NOTE — Plan of Care (Signed)

## 2022-09-06 NOTE — Progress Notes (Signed)
  Echocardiogram 2D Echocardiogram has been performed.  Maren Reamer 09/06/2022, 4:55 PM

## 2022-09-07 DIAGNOSIS — J189 Pneumonia, unspecified organism: Secondary | ICD-10-CM | POA: Diagnosis not present

## 2022-09-07 DIAGNOSIS — A419 Sepsis, unspecified organism: Secondary | ICD-10-CM | POA: Diagnosis not present

## 2022-09-07 LAB — GLUCOSE, CAPILLARY
Glucose-Capillary: 84 mg/dL (ref 70–99)
Glucose-Capillary: 91 mg/dL (ref 70–99)
Glucose-Capillary: 91 mg/dL (ref 70–99)
Glucose-Capillary: 90 mg/dL (ref 70–99)

## 2022-09-07 LAB — BPAM RBC
Blood Product Expiration Date: 202408282359
ISSUE DATE / TIME: 202408010826
Unit Type and Rh: 1700

## 2022-09-07 LAB — ABO/RH: ABO/RH(D): B POS

## 2022-09-07 LAB — CBC
HCT: 26.9 % — ABNORMAL LOW (ref 36.0–46.0)
Hemoglobin: 9 g/dL — ABNORMAL LOW (ref 12.0–15.0)
MCH: 30.1 pg (ref 26.0–34.0)
MCHC: 33.5 g/dL (ref 30.0–36.0)
MCV: 90 fL (ref 80.0–100.0)
Platelets: 196 10*3/uL (ref 150–400)
RBC: 2.99 MIL/uL — ABNORMAL LOW (ref 3.87–5.11)
RDW: 15.6 % — ABNORMAL HIGH (ref 11.5–15.5)
WBC: 5.4 10*3/uL (ref 4.0–10.5)
nRBC: 0 % (ref 0.0–0.2)

## 2022-09-07 LAB — PREPARE RBC (CROSSMATCH)

## 2022-09-07 MED ORDER — MAGNESIUM SULFATE 2 GM/50ML IV SOLN
2.0000 g | Freq: Once | INTRAVENOUS | Status: AC
  Administered 2022-09-07: 2 g via INTRAVENOUS
  Filled 2022-09-07: qty 50

## 2022-09-07 MED ORDER — SODIUM CHLORIDE 0.9% IV SOLUTION: Freq: Once | INTRAVENOUS | Status: AC

## 2022-09-07 MED ORDER — POTASSIUM CHLORIDE CRYS ER 20 MEQ PO TBCR
40.0000 meq | EXTENDED_RELEASE_TABLET | Freq: Once | ORAL | Status: AC
  Administered 2022-09-07: 40 meq via ORAL
  Filled 2022-09-07: qty 2

## 2022-09-07 NOTE — Progress Notes (Signed)
[facility] PROGRESS NOTE  Kelli Hurley (DOB: 08-25-1955) ZOX:[id number] PCP: [facility]  Brief Narrative: 67 y.o. female with medical history significant of multiple myeloma in remission with no evidence of cardiac amyloidosis per cardiac MRI in 2021, hysterectomy, OSA on CPAP, CKD stage III, HTN, HLD admitted on 09/03/2022 with concerns for sepsis due to pneumonia. She was found to have septic shock with concern for intraabdominal abscess related to colovesical/colovaginal fistula as well as new atrial flutter. Pressors weaned 7/29, remains stable on ceftriaxone and azithromycin. Surgery, urology, GYN consulted, recommend delayed procedures while unstable. There was left hydroureteronephrosis related to adhesions for which IR is consulted and plans percutaneous nephrostomy tube placement 8/2 after lovenox washout and transfusion.   Subjective: Breathing better, feeling better, a bit lightheaded on standing. No chest pain.  Objective: BP (!) 128/51   Pulse 79   Temp 98.1 F (36.7 C) (Oral)   Resp (!) 29   Ht 5\' 2"  (1.575 m)   Wt (!) 141.7 kg   SpO2 96%   BMI 57.14 kg/m   Gen: No distress Pulm: Clear, nonlabored  CV: RRR, no MRG, trace edema GI: Soft, NT, ND, +BS Neuro: Alert and oriented. No new focal deficits. Ext: Warm, no deformities. Skin: No new rashes, lesions or ulcers on visualized skin   Assessment & Plan: Principal Problem:   Sepsis due to pneumonia [facility]) Active Problems:   Multiple myeloma (HCC)   Morbid obesity with BMI of 50.0-59.9, adult (HCC)   Essential hypertension   OSA (obstructive sleep apnea)   Lactic acidosis   Hyponatremia   Acute kidney injury superimposed on CKD (HCC)   Hypoalbuminemia due to protein-calorie malnutrition (HCC)   Hypokalemia   Mixed hyperlipidemia   Colovaginal fistula   Hydronephrosis due to obstruction of ureter  Septic shock due to LLL pneumonia and intra-abdominal abscess in the setting  of colovesicular/Vagina fistula. Liberated from pressors, normalized lactic acid. MRSA, RSV, flu, covid PCR's negative. Blood cultures NGTD, sputum culture not testable.  - +Pneumococcal urine antigen, continue ceftriaxone.  - Patient with questionable history of recto-vaginal fistula CT scan of the abdomen and pelvis without contrast showed tethering of the bladder dome, sigmoid colon, and the vaginal cuff and distal left ureter consistent with adhesion and a concern for colovaginal fistula.  - CT findings discussed with Dr. Henreitta Leber please see general surgery consult note dated 09/05/2022 - CT findings discussed with gynecologist Dr. Hurley Cisco outpatient follow-up down the road after resolution of urological and  Infectious issues - CT findings discussed with urologist Dr. Ronne Binning and Dr. Alvester Morin   Progressive normocytic anemia: Primarily suspect hemodilution and myeloma-related anemia as well as AOCKD. Unsure of likelihood of adequate marrow response.  - Give 1u PRBCs this AM. Hemodynamically stable. There is no site of bleeding noted at this time.  Multiple myeloma: Patient has relapsed IgG kappa multiple myeloma with high risk features - While admitted, holding pomalidomide 4 mg 3 weeks on/1 week off. Continue dexamethasone 20 mg weekly  - She follows with Dr. Ellin Saba with last visit being on 06/28/2022   Morbid obesity: Body mass index is 57.14 kg/m.  - Continue semaglutide  OSA:  - CPAP qHS encouraged.     Obstructive uropathy, AKI on stage IIIb CKD: Urology feels ureteral stenting would have high failure rate. - SCr 3.34 >> 2.26 near baseline. - Avoid nephrotoxins - NPO p MN, hold further anticoagulation, again delayed due to transfusion, will plan PCN 8/2 is plan  for relief of left ureteral obstruction.    New onset paroxysmal atrial flutter: Back in NSR. Recent TSH 0.636. - Convert IV amiodarone to PO if stable postprocedure - Holding lovenox as discussed above. Will  restart 24 hours postprocedure per IR. Ideally would minimize time off anticoagulation in light of recently cardioverting.  - LVEF 50-55%, mild LAE, D2GG. - Keep K, Mg replete.  Supp K  Hyponatremia/hypokalemia/hypochloremia/hypophosphatemia:  - Avoid excessive free water, continue monitoring and supplementing prn.   HTN: Holding BP meds in setting of septic shock.   Tyrone Nine, MD [facility] [email or web address] 09/07/2022, 3:58 PM

## 2022-09-07 NOTE — Plan of Care (Signed)

## 2022-09-07 NOTE — Plan of Care (Signed)

## 2022-09-07 NOTE — Progress Notes (Addendum)
0805: [facility] at bedside to pick patient up for [facility] procedure [facility] for L Nephrostomy tube placement.   0815: [facility] called IR about patient's hemoglobin being 6.8. IR states will not take patient unless transfused with PRBCs first. Called blood bank, blood not ready yet.   At 0820: blood bank called and said blood ready. One unit of PRBCs transfusing now at 0830. Dr. Jarvis Newcomer made aware.  Message received from IR PA, states procedure cancelled for today due to Hgb dropping, will try again tomorrow.

## 2022-09-07 NOTE — Plan of Care (Signed)
  Problem: Education: Goal: Knowledge of General Education information will improve Description: Including pain rating scale, medication(s)/side effects and non-pharmacologic comfort measures Outcome: Progressing   Problem: Health Behavior/Discharge Planning: Goal: Ability to manage health-related needs will improve Outcome: Progressing   Problem: Clinical Measurements: Goal: Ability to maintain clinical measurements within normal limits will improve Outcome: Progressing Goal: Diagnostic test results will improve Outcome: Progressing Goal: Respiratory complications will improve Outcome: Progressing Goal: Cardiovascular complication will be avoided Outcome: Progressing   Problem: Activity: Goal: Risk for activity intolerance will decrease Outcome: Progressing   Problem: Nutrition: Goal: Adequate nutrition will be maintained Outcome: Progressing   Problem: Coping: Goal: Level of anxiety will decrease Outcome: Progressing   Problem: Elimination: Goal: Will not experience complications related to bowel motility Outcome: Progressing Goal: Will not experience complications related to urinary retention Outcome: Progressing   Problem: Pain Managment: Goal: General experience of comfort will improve Outcome: Progressing   Problem: Safety: Goal: Ability to remain free from injury will improve Outcome: Progressing   Problem: Skin Integrity: Goal: Risk for impaired skin integrity will decrease Outcome: Progressing   Problem: Activity: Goal: Ability to tolerate increased activity will improve Outcome: Progressing   Problem: Clinical Measurements: Goal: Ability to maintain a body temperature in the normal range will improve Outcome: Progressing   Problem: Respiratory: Goal: Ability to maintain adequate ventilation will improve Outcome: Progressing Goal: Ability to maintain a clear airway will improve Outcome: Progressing   Problem: Education: Goal: Ability to  describe self-care measures that may prevent or decrease complications (Diabetes Survival Skills Education) will improve Outcome: Progressing Goal: Individualized Educational Video(s) Outcome: Progressing   Problem: Coping: Goal: Ability to adjust to condition or change in health will improve Outcome: Progressing   Problem: Fluid Volume: Goal: Ability to maintain a balanced intake and output will improve Outcome: Progressing   Problem: Health Behavior/Discharge Planning: Goal: Ability to identify and utilize available resources and services will improve Outcome: Progressing Goal: Ability to manage health-related needs will improve Outcome: Progressing   Problem: Metabolic: Goal: Ability to maintain appropriate glucose levels will improve Outcome: Progressing   Problem: Nutritional: Goal: Maintenance of adequate nutrition will improve Outcome: Progressing Goal: Progress toward achieving an optimal weight will improve Outcome: Progressing   Problem: Skin Integrity: Goal: Risk for impaired skin integrity will decrease Outcome: Progressing   Problem: Tissue Perfusion: Goal: Adequacy of tissue perfusion will improve Outcome: Progressing   Problem: Clinical Measurements: Goal: Will remain free from infection Outcome: Not Progressing

## 2022-09-07 NOTE — Progress Notes (Signed)
   09/07/22 2328  BiPAP/CPAP/SIPAP  BiPAP/CPAP/SIPAP Pt Type Adult  BiPAP/CPAP/SIPAP DREAMSTATIOND  Mask Type Full face mask  Mask Size Medium  Respiratory Rate 22 breaths/min  FiO2 (%) 21 %  Patient Home Equipment No  Auto Titrate Yes (min 6, max 20)

## 2022-09-07 NOTE — Progress Notes (Signed)
IR called and scheduled pt. For procedure tomorrow 09/08/2022 @ 0830am. [facility] was called and spoke to [facility] to get transport set up for pt. Per [facility], transportation will arrive around 7:45am to pick up pt. And they will transport pt. Back to [facility] after procedure. Patient and family at bedside made aware. Will report information to oncoming shift.

## 2022-09-08 ENCOUNTER — Inpatient Hospital Stay (HOSPITAL_COMMUNITY): Payer: 59

## 2022-09-08 ENCOUNTER — Ambulatory Visit (HOSPITAL_COMMUNITY)
Admit: 2022-09-08 | Discharge: 2022-09-08 | Disposition: A | Discharge status: Home / Self Care | Payer: 59 | Source: Home / Self Care | Attending: Physician Assistant | Admitting: Physician Assistant

## 2022-09-08 ENCOUNTER — Encounter (HOSPITAL_COMMUNITY): Payer: Self-pay | Admitting: Internal Medicine

## 2022-09-08 ENCOUNTER — Ambulatory Visit (HOSPITAL_COMMUNITY)
Admit: 2022-09-08 | Discharge: 2022-09-08 | Disposition: A | Discharge status: Home / Self Care | Payer: 59 | Attending: Family Medicine | Admitting: Family Medicine

## 2022-09-08 DIAGNOSIS — A419 Sepsis, unspecified organism: Secondary | ICD-10-CM | POA: Diagnosis not present

## 2022-09-08 DIAGNOSIS — N1330 Unspecified hydronephrosis: Secondary | ICD-10-CM | POA: Insufficient documentation

## 2022-09-08 DIAGNOSIS — J189 Pneumonia, unspecified organism: Secondary | ICD-10-CM | POA: Diagnosis not present

## 2022-09-08 HISTORY — PX: IR PATIENT EVAL TECH 0-60 MINS: IMG5564

## 2022-09-08 LAB — GLUCOSE, CAPILLARY
Glucose-Capillary: 84 mg/dL (ref 70–99)
Glucose-Capillary: 75 mg/dL (ref 70–99)
Glucose-Capillary: 82 mg/dL (ref 70–99)
Glucose-Capillary: 118 mg/dL — ABNORMAL HIGH (ref 70–99)

## 2022-09-08 LAB — PREPARE RBC (CROSSMATCH)

## 2022-09-08 MED ORDER — MIDAZOLAM HCL 2 MG/2ML IJ SOLN
INTRAMUSCULAR | Status: AC | PRN
Start: 2022-09-08 — End: 2022-09-08
  Administered 2022-09-08: 1 mg via INTRAVENOUS
  Administered 2022-09-08 (×2): .5 mg via INTRAVENOUS

## 2022-09-08 MED ORDER — SODIUM CHLORIDE 0.9% IV SOLUTION: Freq: Once | INTRAVENOUS | Status: DC

## 2022-09-08 MED ORDER — POTASSIUM CHLORIDE CRYS ER 20 MEQ PO TBCR
40.0000 meq | EXTENDED_RELEASE_TABLET | Freq: Once | ORAL | Status: AC
  Administered 2022-09-08: 40 meq via ORAL
  Filled 2022-09-08: qty 2

## 2022-09-08 MED ORDER — AMIODARONE HCL 200 MG PO TABS: 200.0000 mg | ORAL_TABLET | Freq: Two times a day (BID) | ORAL | Status: DC

## 2022-09-08 MED ORDER — LIDOCAINE HCL (PF) 1 % IJ SOLN
10.0000 mL | Freq: Once | INTRAMUSCULAR | Status: AC
  Administered 2022-09-08: 10 mL
  Filled 2022-09-08: qty 10

## 2022-09-08 MED ORDER — AMIODARONE HCL 200 MG PO TABS: 200.0000 mg | ORAL_TABLET | Freq: Every day | ORAL | Status: DC

## 2022-09-08 MED ORDER — OXYCODONE HCL 5 MG PO TABS
5.0000 mg | ORAL_TABLET | ORAL | Status: DC | PRN
  Administered 2022-09-08: 5 mg via ORAL
  Filled 2022-09-08: qty 1

## 2022-09-08 MED ORDER — FENTANYL CITRATE (PF) 100 MCG/2ML IJ SOLN
INTRAMUSCULAR | Status: AC | PRN
Start: 2022-09-08 — End: 2022-09-08
  Administered 2022-09-08 (×3): 25 ug via INTRAVENOUS

## 2022-09-08 MED ORDER — ENOXAPARIN SODIUM 150 MG/ML IJ SOSY
140.0000 mg | PREFILLED_SYRINGE | Freq: Two times a day (BID) | INTRAMUSCULAR | Status: DC
  Administered 2022-09-09: 140 mg via SUBCUTANEOUS
  Filled 2022-09-08 (×5): qty 1

## 2022-09-08 MED ORDER — OXYCODONE HCL 5 MG PO TABS
5.0000 mg | ORAL_TABLET | ORAL | Status: DC | PRN
  Administered 2022-09-08 – 2022-09-09 (×2): 10 mg via ORAL
  Administered 2022-09-09 (×2): 5 mg via ORAL
  Filled 2022-09-08 (×2): qty 1
  Filled 2022-09-08 (×2): qty 2

## 2022-09-08 MED ORDER — AMIODARONE HCL 200 MG PO TABS
400.0000 mg | ORAL_TABLET | Freq: Two times a day (BID) | ORAL | Status: DC
  Administered 2022-09-08 – 2022-09-10 (×4): 400 mg via ORAL
  Filled 2022-09-08 (×4): qty 2

## 2022-09-08 NOTE — Sedation Documentation (Addendum)
Nephrostomy tube placed-DrMcCollough patient tolerated without distress, statlock,gauze +tape applied

## 2022-09-08 NOTE — Consult Note (Signed)
Chief Complaint: Patient was seen in consultation today for left hydronephrosis  Referring Physician(s): Dr. Modena Slater  Supervising Physician: Malachy Moan  Patient Status: [facility] - In-pt  History of Present Illness: Kelli Hurley is a 67 y.o. female with multiple medical comorbidities including multiple myeloma in remission, cardiac amyloidosis, hyperlipidemia, hypertension, CKD stage III, and rectovaginal fistula presented 2 days ago with concerns for severe sepsis and shock thought to be secondary to pneumonia.  She was successfully weaned off Levophed on 7/29.  She has had occasional arrthymias since admission seemingly well-controlled medically at this time.  Despite no abdominal pain or pelvic pain she did have odorous vaginal discharge and ultimately underwent CT Abd Pelv which showed tethering of the bladder dome, sigmoid colon, and the vaginal cuff and distal left ureter consistent with adhesion and a concern for colovaginal fistula.  She was also found to have left hydronephrosis. URology was consulted, however due to her complicated anatomy, recommended IR percutaneous nephrostomy tube placement.    Case reviewed by Dr. Fredia Sorrow who approves patient for hte procedure.  Kelli Hurley presents from [facility] this morning.  Her Hgb has improved after transfusion from 5.8 to 7.6.  She is alert and oriented.  She is understanding of the procedure today and is agreeable to proceed.   Past Medical History:  Diagnosis Date   Anemia    Arthritis    Cancer (HCC)    Claustrophobia 10/05/2014   Hypertension    Leukopenia 08/03/2014   Normocytic hypochromic anemia 08/03/2014   Renal disorder    stage 3     Past Surgical History:  Procedure Laterality Date   ABDOMINAL HYSTERECTOMY     CARDIAC SURGERY     77 months old. States she had a leaky valve.    COLONOSCOPY  03/26/2009   Dr. Darrick Penna; normal colon, small internal hemorrhoids.  Recommended repeat colonoscopy in 10 years.    COLONOSCOPY WITH PROPOFOL N/A 11/25/2019   Procedure: COLONOSCOPY WITH PROPOFOL;  Surgeon: Lanelle Bal, DO;  Location: [facility] ENDO SUITE;  Service: Endoscopy;  Laterality: N/A;  10:45am   OTHER SURGICAL HISTORY     heart surgery as infant to "repair hole in heart"   PORTACATH PLACEMENT Left 02/21/2019   Procedure: INSERTION PORT-A-CATH;  Surgeon: Lucretia Roers, MD;  Location: [facility] ORS;  Service: General;  Laterality: Left;    Allergies: Diclofenac  Medications: Prior to Admission medications   Medication Sig Start Date End Date Taking? Authorizing Provider  acetaminophen (TYLENOL) 650 MG CR tablet Take 650 mg by mouth every 8 (eight) hours as needed for pain.   Yes [provider]  albuterol (VENTOLIN HFA) 108 (90 Base) MCG/ACT inhaler Inhale 2 puffs into the lungs every 6 (six) hours as needed for wheezing or shortness of breath. 02/18/20  Yes Doreatha Massed, MD  aspirin EC 81 MG tablet Take 81 mg by mouth daily.   Yes [provider]  calcitRIOL (ROCALTROL) 0.25 MCG capsule Take 0.25 mcg by mouth daily. 08/23/21  Yes [provider]  dexamethasone (DECADRON) 4 MG tablet TAKE FIVE TABLETS BY MOUTH ONCE A WEEK 06/26/22  Yes Doreatha Massed, MD  furosemide (LASIX) 20 MG tablet TAKE 2 TABLETS BY MOUTH DAILY IN THE MORNING AND TAKE 2 TABLETS AT BEDTIME Patient taking differently: Take 20 mg by mouth as directed. 2 tablet in the morning and 2 tablets at bedtime on Mon., Wed., and Friday. 1 tablet in the morning and 1 tablet at bedtime on Sun.,  Tues., Thurs., and Sat. 08/24/21  Yes Christell Constant, MD  Infant Care Products [facility]) OINT Apply to affected area as needed Externally for 30 days 08/08/21  Yes [provider]  lidocaine-prilocaine (EMLA) cream APPLY 1 QUARTER SIZED AMOUNT 1 HOUR PRIOR TO TREATMENT 04/10/22  Yes Doreatha Massed, MD  losartan (COZAAR) 25 MG tablet Take 12.5 mg by mouth daily. 06/16/20  Yes [provider]   magnesium oxide (MAG-OX) 400 (240 Mg) MG tablet TAKE 1 TABLET BY MOUTH TWICE DAILY 07/12/21  Yes Doreatha Massed, MD  metolazone (ZAROXOLYN) 2.5 MG tablet Take 1 tablet (2.5 mg total) by mouth daily. Patient taking differently: Take 2.5 mg by mouth 3 (three) times a week. Mon., Wed., and Friday 11/29/21  Yes Doreatha Massed, MD  Multiple Vitamin (TAB-A-VITE) TABS TAKE 1 TABLET BY MOUTH ONCE DAILY. Patient taking differently: Take 1 tablet by mouth daily. 04/10/17  Yes Hubbard Hartshorn, NP  oxyCODONE-acetaminophen (PERCOCET/ROXICET) 5-325 MG tablet TAKE 1 OR 2 TABLETS BY MOUTH EVERY TWELVE HOURS AS NEEDED for severe pain 08/07/22  Yes Doreatha Massed, MD  pomalidomide (POMALYST) 4 MG capsule Take 1 capsule (4 mg total) by mouth daily. 21 days on, 7 days off 08/23/22  Yes Doreatha Massed, MD  potassium chloride SA (KLOR-CON M) 20 MEQ tablet TAKE 1 TABLET BY MOUTH THREE TIMES DAILY Patient taking differently: Take 20 mEq by mouth daily. 04/26/22  Yes Doreatha Massed, MD  pregabalin (LYRICA) 75 MG capsule Take 1 capsule (75 mg total) by mouth daily. 06/28/22  Yes Doreatha Massed, MD  rosuvastatin (CRESTOR) 10 MG tablet Take 1 tablet (10 mg total) by mouth daily. 05/02/22  Yes Chandrasekhar, Mahesh A, MD  Semaglutide, 2 MG/DOSE, (OZEMPIC, 2 MG/DOSE,) 8 MG/3ML SOPN Inject 2 mg into the skin once a week. 08/31/22  Yes Meriam Sprague, MD  trolamine salicylate (ASPERCREME) 10 % cream Apply 1 application topically 2 (two) times daily as needed for muscle pain.   Yes [provider]  VITAMIN D PO Take by mouth.   Yes [provider]     Family History  Problem Relation Age of Onset   Cancer Maternal Grandmother    Cancer Father    Hypertension Father    Cancer Mother    Hypertension Mother    Hypertension Brother    Prostate cancer Brother    Hypertension Brother    Hypertension Brother    Hypertension Sister    Hypertension Sister    Hypertension  Sister    Hypertension Sister    Colon cancer Neg Hx    Colon polyps Neg Hx     Social History   Socioeconomic History   Marital status: Single    Spouse name: Not on file   Number of children: Not on file   Years of education: Not on file   Highest education level: Not on file  Occupational History   Not on file  Tobacco Use   Smoking status: Never   Smokeless tobacco: Never  Vaping Use   Vaping status: Never Used  Substance and Sexual Activity   Alcohol use: No   Drug use: No   Sexual activity: Not Currently    Birth control/protection: Surgical    Comment: divorced- 2 daughters; hyst  Other Topics Concern   Not on file  Social History Narrative   Not on file   Social Determinants of Health   Financial Resource Strain: Medium Risk (08/16/2022)   Overall Financial Resource Strain (CARDIA)  Difficulty of Paying Living Expenses: Somewhat hard  Food Insecurity: No Food Insecurity (09/04/2022)   Hunger Vital Sign    Worried About Running Out of Food in the Last Year: Never true    Ran Out of Food in the Last Year: Never true  Transportation Needs: No Transportation Needs (09/04/2022)   PRAPARE - Administrator, Civil Service (Medical): No    Lack of Transportation (Non-Medical): No  Physical Activity: Insufficiently Active (08/16/2022)   Exercise Vital Sign    Days of Exercise per Week: 1 day    Minutes of Exercise per Session: 10 min  Stress: No Stress Concern Present (08/16/2022)   [employer] of Occupational Health - Occupational Stress Questionnaire    Feeling of Stress : Not at all  Social Connections: Moderately Isolated (08/16/2022)   Social Connection and Isolation Panel [NHANES]    Frequency of Communication with Friends and Family: More than three times a week    Frequency of Social Gatherings with Friends and Family: Once a week    Attends Religious Services: More than 4 times per year    Active Member of [employer] or Organizations: No     Attends Banker Meetings: Never    Marital Status: Divorced     Review of Systems: A 12 point ROS discussed and pertinent positives are indicated in the HPI above.  All other systems are negative.  Review of Systems  Constitutional:  Negative for fatigue and fever.  Respiratory:  Negative for cough and shortness of breath.   Cardiovascular:  Negative for chest pain.  Gastrointestinal:  Negative for abdominal pain, nausea and vomiting.  Musculoskeletal:  Negative for back pain.  Psychiatric/Behavioral:  Negative for behavioral problems and confusion.     Vital Signs: BP (!) 142/71   Pulse 76   Temp 99.3 F (37.4 C) (Oral)   Resp (!) 21   Ht 5\' 2"  (1.575 m)   Wt (!) 313 lb 15 oz (142.4 kg)   SpO2 94%   BMI 57.42 kg/m   Physical Exam Vitals and nursing note reviewed.  Constitutional:      General: She is not in acute distress.    Appearance: Normal appearance. She is not ill-appearing.  HENT:     Mouth/Throat:     Mouth: Mucous membranes are moist.     Pharynx: Oropharynx is clear.  Cardiovascular:     Rate and Rhythm: Normal rate and regular rhythm.  Pulmonary:     Effort: Pulmonary effort is normal.     Breath sounds: Normal breath sounds.  Abdominal:     General: Abdomen is flat. There is no distension.     Palpations: Abdomen is soft.     Tenderness: There is no abdominal tenderness.  Skin:    General: Skin is warm and dry.  Neurological:     General: No focal deficit present.     Mental Status: She is alert and oriented to person, place, and time. Mental status is at baseline.  Psychiatric:        Mood and Affect: Mood normal.        Behavior: Behavior normal.        Thought Content: Thought content normal.        Judgment: Judgment normal.      MD Evaluation Airway: WNL Heart: WNL Abdomen: WNL Chest/ Lungs: WNL ASA  Classification: 3 Mallampati/Airway Score: Two   Imaging: ECHOCARDIOGRAM COMPLETE  Result Date: 09/07/2022     ECHOCARDIOGRAM  REPORT   Patient Name:   Kelli Hurley Date of Exam: 09/06/2022 Medical Rec #:  [id number]          Height:       62.0 in Accession #:    [id number]         Weight:       312.4 lb Date of Birth:  04/28/1955         BSA:          2.311 m Patient Age:    66 years           BP:           113/71 mmHg Patient Gender: F                  HR:           83 bpm. [facility] Procedure: 2D Echo, Color Doppler and Cardiac Doppler Indications:    Abnormal ECG R94.31  History:        Patient has prior history of Echocardiogram examinations, most                 recent 10/28/2021. Sepsis; Risk Factors:Sleep Apnea,                 Hypertension, Dyslipidemia and Non-Smoker.  Sonographer:    Aron Baba Referring Phys: [id number] COURAGE EMOKPAE  Sonographer Comments: Patient is obese and suboptimal parasternal window. IMPRESSIONS  1. Left ventricular ejection fraction, by estimation, is 50 to 55%. The left ventricle has low normal function. The left ventricle has no regional wall motion abnormalities. Left ventricular diastolic parameters are consistent with Grade II diastolic dysfunction (pseudonormalization). Elevated left atrial pressure.  2. Right ventricular systolic function is normal. The right ventricular size is normal. There is normal pulmonary artery systolic pressure.  3. Left atrial size was mildly dilated.  4. The mitral valve is abnormal. Mild mitral valve regurgitation. No evidence of mitral stenosis.  5. The aortic valve was not well visualized. Aortic valve regurgitation is not visualized. No aortic stenosis is present.  6. Aortic dilatation noted. There is mild dilatation of the ascending aorta, measuring 37 mm.  7. The inferior vena cava is normal in size with greater than 50% respiratory variability, suggesting right atrial pressure of 3 mmHg. FINDINGS  Left Ventricle: Left ventricular ejection fraction, by estimation, is 50 to 55%. The left ventricle has low normal function. The  left ventricle has no regional wall motion abnormalities. The left ventricular internal cavity size was normal in size. There is no left ventricular hypertrophy. Left ventricular diastolic parameters are consistent with Grade II diastolic dysfunction (pseudonormalization). Elevated left atrial pressure. Right Ventricle: The right ventricular size is normal. Right vetricular wall thickness was not well visualized. Right ventricular systolic function is normal. There is normal pulmonary artery systolic pressure. The tricuspid regurgitant velocity is 1.51 m/s, and with an assumed right atrial pressure of 3 mmHg, the estimated right ventricular systolic pressure is 12.1 mmHg. Left Atrium: Left atrial size was mildly dilated. Right Atrium: Right atrial size was normal in size. Pericardium: There is no evidence of pericardial effusion. Mitral Valve: The mitral valve is abnormal. Mild mitral valve regurgitation. No evidence of mitral valve stenosis. Tricuspid Valve: The tricuspid valve is normal in structure. Tricuspid valve regurgitation is trivial. No evidence of tricuspid stenosis. Aortic Valve: The aortic valve was not well visualized. Aortic valve regurgitation is not visualized. No aortic stenosis is present. Aortic valve mean  gradient measures 7.0 mmHg. Aortic valve peak gradient measures 13.4 mmHg. Pulmonic Valve: The pulmonic valve was not well visualized. Pulmonic valve regurgitation is mild. No evidence of pulmonic stenosis. Aorta: The aortic root is normal in size and structure and aortic dilatation noted. There is mild dilatation of the ascending aorta, measuring 37 mm. Venous: The inferior vena cava is normal in size with greater than 50% respiratory variability, suggesting right atrial pressure of 3 mmHg. IAS/Shunts: No atrial level shunt detected by color flow Doppler.  LEFT VENTRICLE PLAX 2D LVIDd:         4.60 cm      Diastology LVIDs:         3.60 cm      LV e' medial:    6.53 cm/s LV PW:         0.80 cm       LV E/e' medial:  19.8 LV IVS:        0.80 cm      LV e' lateral:   10.10 cm/s LVOT diam:     2.20 cm      LV E/e' lateral: 12.8 LVOT Area:     3.80 cm  LV Volumes (MOD) LV vol d, MOD A2C: 141.0 ml LV vol d, MOD A4C: 139.0 ml LV vol s, MOD A2C: 71.3 ml LV vol s, MOD A4C: 66.6 ml LV SV MOD A2C:     69.7 ml LV SV MOD A4C:     139.0 ml LV SV MOD BP:      72.2 ml RIGHT VENTRICLE RV S prime:     12.40 cm/s TAPSE (M-mode): 2.2 cm LEFT ATRIUM             Index        RIGHT ATRIUM           Index LA diam:        2.80 cm 1.21 cm/m   RA Area:     15.40 cm LA Vol (A2C):   84.9 ml 36.74 ml/m  RA Volume:   36.00 ml  15.58 ml/m LA Vol (A4C):   96.6 ml 41.81 ml/m LA Biplane Vol: 96.7 ml 41.85 ml/m  AORTIC VALVE               PULMONIC VALVE AV Vmax:      183.00 cm/s  PR End Diast Vel: 5.24 msec AV Vmean:     125.000 cm/s AV VTI:       0.348 m AV Peak Grad: 13.4 mmHg AV Mean Grad: 7.0 mmHg  AORTA Ao Root diam: 3.70 cm Ao Asc diam:  3.70 cm MITRAL VALVE                 TRICUSPID VALVE MV Area (PHT): 3.60 cm      TR Peak grad:   9.1 mmHg MV Decel Time: 211 msec      TR Vmax:        151.00 cm/s MR Peak grad:   95.0 mmHg MR Mean grad:   72.0 mmHg    SHUNTS MR Vmax:        487.33 cm/s  Systemic Diam: 2.20 cm MR Vmean:       405.0 cm/s MR PISA:        0.25 cm MR PISA Radius: 0.20 cm MV E velocity: 129.00 cm/s MV A velocity: 85.00 cm/s MV E/A ratio:  1.52 Dina Rich MD Electronically signed by Dina Rich MD Signature Date/Time: 09/07/2022/12:38:48 PM  Final    CT ABDOMEN PELVIS WO CONTRAST  Result Date: 09/04/2022 CLINICAL DATA:  Pelvic pain.  Suspected recto vaginal fistula. EXAM: CT ABDOMEN AND PELVIS WITHOUT CONTRAST TECHNIQUE: Multidetector CT imaging of the abdomen and pelvis was performed following the standard protocol without IV contrast. RADIATION DOSE REDUCTION: This exam was performed according to the departmental dose-optimization program which includes automated exposure control, adjustment of the mA  and/or kV according to patient size and/or use of iterative reconstruction technique. COMPARISON:  None Available. FINDINGS: Evaluation of this exam is limited in the absence of intravenous contrast as well as due to respiratory motion. Lower chest: Trace left pleural effusion. There are bibasilar linear atelectasis or scarring. The tip of a central venous line is seen at the cavoatrial junction. No intra-abdominal free air or free fluid. Hepatobiliary: Probable mild fatty liver. No biliary dilatation. The gallbladder is unremarkable. Pancreas: Unremarkable. No pancreatic ductal dilatation or surrounding inflammatory changes. Spleen: Normal in size without focal abnormality. Adrenals/Urinary Tract: The adrenal glands are unremarkable. There is moderate left hydronephrosis. There is mild left hydroureter. A transition is noted in the distal left ureter, possibly related to adhesions. No stone identified. There is areas of cortical irregularity and scarring in the upper pole of the left kidney. There is no hydronephrosis or nephrolithiasis on the right. The right ureter is unremarkable. The urinary bladder is collapsed. Stomach/Bowel: Mild sigmoid diverticulosis. There is an area of inflammatory changes and stranding adjacent to the sigmoid colon with small pockets of extraluminal air. Findings may represent diverticulitis with focally contained perforation or developing abscess. No drainable fluid collection at this time. There is tethering of the dome of the bladder, vaginal cuff, distal left ureter and rectum consistent with adhesions. There is faint oral contrast in the region of the tethering which may be extraluminal (99/6 and axial 72/2) and may represent a fistulous track. There is no bowel obstruction. The appendix is normal. Vascular/Lymphatic: Mild aortoiliac atherosclerotic disease. The IVC is unremarkable. No portal venous gas. There is no adenopathy. Reproductive: Hysterectomy.  No adnexal masses. Other:  None Musculoskeletal: Osteopenia with degenerative changes of the spine. No acute osseous pathology. IMPRESSION: 1. Tethering of the bladder dome, sigmoid colon, vaginal cough, and distal left ureter consistent with adhesion. 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 Fluoroscopic enema study may provide better evaluation. 2. Ill-defined area of inflammation and tethering in the pelvis with small pockets of air adjacent to the sigmoid colon as above. Perforated sigmoid diverticulitis is not excluded. No drainable fluid collection at this time. 3. Moderate left hydronephrosis and mild left hydroureter with a transition in the distal left ureter, possibly related to adhesions. No stone identified. 4.  Aortic Atherosclerosis (ICD10-I70.0). Electronically Signed   By: Elgie Collard M.D.   On: 09/04/2022 20:41   DG Chest Port 1 View  Result Date: 09/03/2022 CLINICAL DATA:  Possible sepsis. EXAM: PORTABLE CHEST 1 VIEW COMPARISON:  08/06/2019. FINDINGS: Heart is enlarged and the mediastinal contour is within normal limits. Lung volumes are low with mild airspace disease at the left lung base. No definite effusion or pneumothorax. A left chest port is stable in position. No acute osseous abnormality. IMPRESSION: 1. Mild airspace disease at the left lung base, possible atelectasis or infiltrate. 2. Cardiomegaly. Electronically Signed   By: Thornell Sartorius M.D.   On: 09/03/2022 20:52    Labs:  CBC: Recent Labs    09/06/22 0440 09/07/22 0501 09/07/22  1307 09/08/22 0324  WBC 5.7 4.1 5.4 4.9  HGB 7.4* 6.8* 9.0* 7.6*  HCT 23.5* 20.7* 26.9* 23.4*  PLT 174 166 196 179    COAGS: Recent Labs    09/03/22 2026  INR 1.2  APTT 24    BMP: Recent Labs    09/05/22 0459 09/06/22 0440 09/07/22 0501 09/08/22 0324  NA 133* 134* 134* 135  K 3.4* 3.5 3.2* 3.2*  CL 100 102 104 107  CO2 24 22 22  21*  GLUCOSE 130* 98 96 95  BUN 39* 32*  23 19  CALCIUM 8.0* 8.0* 8.1* 8.1*  CREATININE 2.62* 2.26* 1.95* 1.81*  GFRNONAA 20* 24* 28* 31*    LIVER FUNCTION TESTS: Recent Labs    03/22/22 1034 06/12/22 1200 06/21/22 1157 07/10/22 1209 09/03/22 2026 09/04/22 0449 09/06/22 0440  BILITOT 0.4  --  0.7  --  1.3* 1.0  --   AST 16  --  18  --  54* 97*  --   ALT 13  --  13  --  18 27  --   ALKPHOS 67  --  49  --  43 45  --   PROT 7.1  --  6.9  --  6.6 6.1*  --   ALBUMIN 3.8   < > 3.7 3.7 3.4* 2.9* 2.5*   < > = values in this interval not displayed.    TUMOR MARKERS: No results for input(s): "AFPTM", "CEA", "CA199", "CHROMGRNA" in the last 8760 hours.  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  IR consulted for nephrostomy tube placement.  Discussed with patient upon arrival to [facility]. She is understanding of the goals of the pocedure and is agreeable to proceed.   She has been NPO.  Not on blood thinners.   Advance Care Plan: The advanced care plan/surrogate decision maker was discussed at the time of visit and the patient did not wish to discuss or was not able to name a surrogate decision maker or provide an advance care plan.     Thank you for this interesting consult.  I greatly enjoyed meeting Kelli Hurley and look forward to participating in their care.  A copy of this report was sent to the requesting provider on this date.  Electronically Signed: Hoyt Koch, PA 09/08/2022, 9:26 AM   I spent a total of 40 Minutes    in face to face in clinical consultation, greater than 50% of which was counseling/coordinating care for colovaginal fistula, left hydronephrosis

## 2022-09-08 NOTE — Progress Notes (Signed)
Patient placed on full face CPAP for the night.  She is tolerating well at this time.     09/08/22 2240  BiPAP/CPAP/SIPAP  $ Non-Invasive Ventilator  Non-Invasive Vent Subsequent  BiPAP/CPAP/SIPAP Pt Type Adult  BiPAP/CPAP/SIPAP DREAMSTATIOND  Mask Type Full face mask  Mask Size Medium  Respiratory Rate 20 breaths/min  FiO2 (%) 21 %  Patient Home Equipment No  Auto Titrate Yes (min6 max 20)  BiPAP/CPAP /SiPAP Vitals  Pulse Rate 73  Resp 19  SpO2 98 %  MEWS Score/Color  MEWS Score 0  MEWS Score Color [place] Si

## 2022-09-08 NOTE — Sedation Documentation (Signed)
Holding for carelink in no acute distress

## 2022-09-08 NOTE — Procedures (Signed)
Pt transferred from [facility] to [facility] for left PCN. Dr. Meredeth Ide concerned that due to patient anatomy and body habitus, depth from skin to kidney may be to deep for IR procedure. MD used ultrasound to confirm depth and pt procedure moved to CT to accommodate.

## 2022-09-08 NOTE — Procedures (Signed)
Interventional Radiology Procedure Note  Procedure: Placement of a 35F PCN into the LEFT kidney under CT guidance.   Complications: None  Estimated Blood Loss: None  Recommendations:  - PCN to bag drainage - Return to IR in 8 weeks for tube check and change   Signed,  Sterling Big, MD

## 2022-09-08 NOTE — Progress Notes (Signed)
On 09/08/22 at 1122 fentanyl 25 mcg iv given, not cancelled, unable to chart and correct on MAR d/t closed encounter and patient transferred back to [facility] fentanyl and 2mg  versed given during nephrostomy tube placement

## 2022-09-08 NOTE — Progress Notes (Signed)
[facility] PROGRESS NOTE  Kelli Hurley (DOB: [birth year]) ZOX:[id number] PCP: [facility]  Brief Narrative: 67 y.o. female with medical history significant of multiple myeloma in remission with no evidence of cardiac amyloidosis per cardiac MRI in 2021, hysterectomy, OSA on CPAP, CKD stage III, HTN, HLD admitted on 09/03/2022 with concerns for sepsis due to pneumonia. She was found to have septic shock with concern for intraabdominal abscess related to colovesical/colovaginal fistula as well as new atrial flutter. Pressors weaned 7/29, remains stable on ceftriaxone and azithromycin. Surgery, urology, GYN consulted, recommend delayed procedures while unstable. There was left hydroureteronephrosis related to adhesions for which IR is consulted and plans percutaneous nephrostomy tube placement 8/2 after lovenox washout and transfusion.   Subjective: Having pain not controlled by tylenol at site of nephrostomy tube. No breathing problems or chest pain.   Objective: BP 134/75   Pulse 76   Temp 99.3 F (37.4 C) (Oral)   Resp 19   Ht 5\' 2"  (1.575 m)   Wt (!) 142.4 kg   SpO2 96%   BMI 57.42 kg/m   Gen: No distress Pulm: Clear, nonlabored  CV: RRR, no MRG or edema GI: Soft, NT, ND, +BS Neuro: Alert and oriented. No new focal deficits. Ext: Warm, no deformities Skin: PCN left lower back c/d/I, blood-tinged urine in bag. No other rashes, lesions or ulcers on visualized skin   Assessment & Plan: Principal Problem:   Sepsis due to pneumonia [facility]) Active Problems:   Multiple myeloma (HCC)   Morbid obesity with BMI of 50.0-59.9, adult (HCC)   Essential hypertension   OSA (obstructive sleep apnea)   Lactic acidosis   Hyponatremia   Acute kidney injury superimposed on CKD (HCC)   Hypoalbuminemia due to protein-calorie malnutrition (HCC)   Hypokalemia   Mixed hyperlipidemia   Colovaginal fistula   Hydronephrosis due to obstruction of ureter  Septic  shock due to LLL pneumonia and intra-abdominal abscess in the setting of colovesicular/Vagina fistula. Liberated from pressors, normalized lactic acid. MRSA, RSV, flu, covid PCR's negative. Blood cultures NGTD, sputum culture not testable.  - +Pneumococcal urine antigen, continue ceftriaxone, will complete 7 days Sunday. - Patient with questionable history of recto-vaginal fistula. 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  - CT findings discussed with Dr. Henreitta Leber please see general surgery consult note dated 09/05/2022 - CT findings discussed with gynecologist Dr. Hurley Cisco outpatient follow-up down the road after resolution of urological and  Infectious issues - CT findings discussed with urologist Dr. Ronne Binning and Dr. Alvester Morin   Progressive normocytic anemia: Primarily suspect hemodilution and myeloma-related anemia as well as AOCKD. Unsure of likelihood of adequate marrow response.  - Hgb up as anticipated after equilibration this AM to 7.6g/dl. Hemodynamically stable. There is no site of bleeding noted at this time.  Multiple myeloma: Patient has relapsed IgG kappa multiple myeloma with high risk features - While admitted, holding pomalidomide 4 mg 3 weeks on/1 week off. Continue dexamethasone 20 mg weekly  - She follows with Dr. Ellin Saba with last visit being on 06/28/2022   Morbid obesity: Body mass index is 57.42 kg/m.  - Continue semaglutide  OSA:  - CPAP qHS encouraged.     Obstructive uropathy, AKI on stage IIIb CKD: Urology feels ureteral stenting would have high failure rate. - SCr 3.34 >> 1.81 near  baseline. - Avoid nephrotoxins - s/p left PCN 8/2 by Dr. Archer Asa. Return to IR in 8 weeks for tube check and change.    New onset paroxysmal atrial flutter: Back in NSR. Recent TSH 0.636. - Convert IV  amiodarone to PO with load, will follow up with cardiology.  - Holding lovenox as discussed above. Will restart 24 hours postprocedure per IR. Ideally would minimize time off anticoagulation in light of recently cardioverting.  - LVEF 50-55%, mild LAE, D2GG. - Keep K, Mg replete.  Supp K again today  Hyponatremia/hypokalemia/hypochloremia/hypophosphatemia:  - Avoid excessive free water, continue monitoring and supplementing prn.   HTN: Holding BP meds in setting of septic shock.   Tyrone Nine, MD [facility] [email or web address] 09/08/2022, 3:39 PM

## 2022-09-08 NOTE — Care Management Important Message (Signed)
Important Message  Patient Details  Name: Kelli Hurley MRN: [id number] Date of Birth: 05-11-1955   Medicare Important Message Given:  Yes     Corey Harold 09/08/2022, 4:20 PM

## 2022-09-08 NOTE — Progress Notes (Signed)
PT Cancellation Note  Patient Details Name: Kelli Hurley MRN: [id number] DOB: 09/10/1955   Cancelled Treatment:    Reason Eval/Treat Not Completed: Patient declined, no reason specified.  Patient declined therapy due to fatigue after s/p procedure.   2:36 PM, 09/08/22 Ocie Bob, MPT Physical Therapist with [phone] office [phone] mobile phone

## 2022-09-09 DIAGNOSIS — A419 Sepsis, unspecified organism: Secondary | ICD-10-CM | POA: Diagnosis not present

## 2022-09-09 DIAGNOSIS — J189 Pneumonia, unspecified organism: Secondary | ICD-10-CM | POA: Diagnosis not present

## 2022-09-09 LAB — GLUCOSE, CAPILLARY
Glucose-Capillary: 79 mg/dL (ref 70–99)
Glucose-Capillary: 83 mg/dL (ref 70–99)
Glucose-Capillary: 90 mg/dL (ref 70–99)
Glucose-Capillary: 101 mg/dL — ABNORMAL HIGH (ref 70–99)

## 2022-09-09 MED ORDER — APIXABAN 5 MG PO TABS
5.0000 mg | ORAL_TABLET | Freq: Two times a day (BID) | ORAL | Status: DC
  Administered 2022-09-09 – 2022-09-10 (×2): 5 mg via ORAL
  Filled 2022-09-09 (×2): qty 1

## 2022-09-09 MED ORDER — CHLORHEXIDINE GLUCONATE CLOTH 2 % EX PADS
6.0000 | MEDICATED_PAD | Freq: Every day | CUTANEOUS | Status: DC
  Administered 2022-09-10: 6 via TOPICAL

## 2022-09-09 NOTE — TOC Progression Note (Signed)
Transition of Care [facility]) - Progression Note    Patient Details  Name: Kelli Hurley MRN: [id number] Date of Birth: 03-24-1955  Transition of Care [facility] CM/SW Contact  Catalina Gravel, [place] Phone Number: 09/09/2022, 5:09 PM  Clinical Narrative:    CSW received secure chat from RN, family/pt requesting a lift chair to home at [place].  CSW contacted [employer] to refer.  TOC to follow.    Expected Discharge Plan: Home/Self Care Barriers to Discharge: Continued Medical Work up  Expected Discharge Plan and Services In-house Referral: Clinical Social Work     Living arrangements for the past 2 months: Single Family Home                           HH Arranged: PT HH Agency: [facility] Date [facility] Agency Contacted: 09/04/22 Time HH Agency Contacted: [phone] Representative spoke with at [facility] Agency: Kandee Keen   Social Determinants of Health (SDOH) Interventions SDOH Screenings   Food Insecurity: No Food Insecurity (09/04/2022)  Housing: Low Risk  (09/04/2022)  Transportation Needs: No Transportation Needs (09/04/2022)  Utilities: Not At Risk (09/04/2022)  Alcohol Screen: Low Risk  (08/16/2022)  Depression (PHQ2-9): Low Risk  (08/16/2022)  Financial Resource Strain: Medium Risk (08/16/2022)  Physical Activity: Insufficiently Active (08/16/2022)  Social Connections: Moderately Isolated (08/16/2022)  Stress: No Stress Concern Present (08/16/2022)  Tobacco Use: Low Risk  (09/08/2022)    Readmission Risk Interventions    09/04/2022    8:40 AM  Readmission Risk Prevention Plan  Transportation Screening Complete  HRI or Home Care Consult Complete  Social Work Consult for Recovery Care Planning/Counseling Complete  Palliative Care Screening Not Applicable  Medication Review [employer]) Complete

## 2022-09-09 NOTE — Progress Notes (Signed)
[facility] PROGRESS NOTE  Kelli Hurley (DOB: 1955/08/23) AOZ:[id number] PCP: [facility]  Brief Narrative: 67 y.o. female with medical history significant of multiple myeloma in remission with no evidence of cardiac amyloidosis per cardiac MRI in 2021, hysterectomy, OSA on CPAP, CKD stage III, HTN, HLD admitted on 09/03/2022 with concerns for sepsis due to pneumonia. She was found to have septic shock with concern for intraabdominal abscess related to colovesical/colovaginal fistula as well as new atrial flutter. Pressors weaned 7/29, remains stable on ceftriaxone and azithromycin. Surgery, urology, GYN consulted, recommend delayed procedures while unstable. There was left hydroureteronephrosis related to adhesions for which IR was consulted and placed percutaneous nephrostomy tube 8/2 after lovenox washout and transfusion.   Subjective: Pain due to nephrostomy better controlled on higher dose oxycodone. No dyspnea or chest pain.  Objective: BP 132/81 (BP Location: Right Wrist)   Pulse 77   Temp 97.8 F (36.6 C) (Oral)   Resp 20   Ht 5\' 2"  (1.575 m)   Wt (!) 142.4 kg   SpO2 98%   BMI 57.42 kg/m   Gen: No distress Pulm: Clear, nonlabored  CV: RRR, no MRG or edema GI: Soft, NT, ND, +BS  Neuro: Alert and oriented. No new focal deficits.  Skin: Nephrostomy tube site appears c/d/I with clear yellow urine and slight blood tinge in bag.   Assessment & Plan: Principal Problem:   Sepsis due to pneumonia [facility]) Active Problems:   Multiple myeloma (HCC)   Morbid obesity with BMI of 50.0-59.9, adult (HCC)   Essential hypertension   OSA (obstructive sleep apnea)   Lactic acidosis   Hyponatremia   Acute kidney injury superimposed on CKD (HCC)   Hypoalbuminemia due to protein-calorie malnutrition (HCC)   Hypokalemia   Mixed hyperlipidemia   Colovaginal fistula   Hydronephrosis due to obstruction of ureter  Septic shock due to LLL pneumonia and  intra-abdominal abscess in the setting of colovagina fistula. Liberated from pressors, normalized lactic acid. MRSA, RSV, flu, covid PCR's negative. Blood cultures NGTD, sputum culture not testable.  - +Pneumococcal urine antigen, continue ceftriaxone, will complete 7 days Sunday. - Patient with questionable history of recto-vaginal fistula. 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  - CT findings discussed with Dr. Henreitta Leber who will follow up as outpatient to discuss colectomy.  - CT findings discussed with gynecologist Dr. Despina Hidden who agrees with outpatient management. - CT findings discussed with urologist Dr. Ronne Binning and Dr. Alvester Morin, will need urology follow up.    Progressive normocytic anemia: Primarily suspect hemodilution and myeloma-related anemia as well as AOCKD. Unsure of likelihood of adequate marrow response.  - Hgb up as anticipated w/2u RBCs on 8/1-8/2. Hemodynamically stable. Only mild bleeding associated with left nephrostomy.  Multiple myeloma: Patient has relapsed IgG kappa multiple myeloma with high risk features - While admitted, holding pomalidomide 4 mg 3 weeks on/1 week off. Continue dexamethasone 20 mg weekly  - She follows with Dr. Ellin Saba with last visit being on 06/28/2022   Morbid obesity: Body mass index is 57.42 kg/m.  - Continue semaglutide - Still needs PT evaluation to inform disposition venue.  OSA:  - CPAP qHS encouraged.     Obstructive uropathy, AKI on stage IIIb CKD: Urology feels ureteral stenting would have high failure rate. - SCr 3.34 >> 1.81 near baseline. -  Avoid nephrotoxins - s/p left PCN 8/2 by Dr. Archer Asa. Return to IR in 8 weeks for tube check and change.    New onset paroxysmal atrial flutter: Back in NSR. Recent TSH 0.636. - Convert IV amiodarone to PO with load, will  follow up with cardiology.  - Holding lovenox as discussed above. Will restart 24 hours postprocedure per IR. Ideally would minimize time off anticoagulation in light of recently cardioverting. Start eliquis 8/3 PM. - LVEF 50-55%, mild LAE, D2GG. - Keep K, Mg replete.  Supp K again today  Hyponatremia/hypokalemia/hypochloremia/hypophosphatemia:  - Avoid excessive free water, continue monitoring and supplementing prn.   HTN: Holding BP meds in setting of septic shock.   Tyrone Nine, MD [facility] [email or web address] 09/09/2022, 1:18 PM

## 2022-09-09 NOTE — Plan of Care (Signed)
  Problem: Education: Goal: Knowledge of General Education information will improve Description: Including pain rating scale, medication(s)/side effects and non-pharmacologic comfort measures Outcome: Progressing   Problem: Health Behavior/Discharge Planning: Goal: Ability to manage health-related needs will improve Outcome: Progressing   Problem: Clinical Measurements: Goal: Ability to maintain clinical measurements within normal limits will improve Outcome: Progressing Goal: Will remain free from infection Outcome: Progressing Goal: Diagnostic test results will improve Outcome: Progressing Goal: Respiratory complications will improve Outcome: Progressing Goal: Cardiovascular complication will be avoided Outcome: Progressing   Problem: Activity: Goal: Risk for activity intolerance will decrease Outcome: Progressing   Problem: Nutrition: Goal: Adequate nutrition will be maintained Outcome: Progressing   Problem: Coping: Goal: Level of anxiety will decrease Outcome: Progressing   Problem: Elimination: Goal: Will not experience complications related to bowel motility Outcome: Progressing Goal: Will not experience complications related to urinary retention Outcome: Progressing   Problem: Pain Managment: Goal: General experience of comfort will improve Outcome: Progressing   Problem: Safety: Goal: Ability to remain free from injury will improve Outcome: Progressing   Problem: Skin Integrity: Goal: Risk for impaired skin integrity will decrease Outcome: Progressing   Problem: Activity: Goal: Ability to tolerate increased activity will improve Outcome: Progressing   Problem: Clinical Measurements: Goal: Ability to maintain a body temperature in the normal range will improve Outcome: Progressing   Problem: Respiratory: Goal: Ability to maintain adequate ventilation will improve Outcome: Progressing Goal: Ability to maintain a clear airway will improve Outcome:  Progressing   Problem: Education: Goal: Ability to describe self-care measures that may prevent or decrease complications (Diabetes Survival Skills Education) will improve Outcome: Progressing Goal: Individualized Educational Video(s) Outcome: Progressing   Problem: Coping: Goal: Ability to adjust to condition or change in health will improve Outcome: Progressing   Problem: Fluid Volume: Goal: Ability to maintain a balanced intake and output will improve Outcome: Progressing   Problem: Health Behavior/Discharge Planning: Goal: Ability to identify and utilize available resources and services will improve Outcome: Progressing Goal: Ability to manage health-related needs will improve Outcome: Progressing   Problem: Nutritional: Goal: Maintenance of adequate nutrition will improve Outcome: Progressing Goal: Progress toward achieving an optimal weight will improve Outcome: Progressing   Problem: Skin Integrity: Goal: Risk for impaired skin integrity will decrease Outcome: Progressing   Problem: Tissue Perfusion: Goal: Adequacy of tissue perfusion will improve Outcome: Progressing

## 2022-09-09 NOTE — Progress Notes (Signed)
ANTICOAGULATION CONSULT NOTE  Pharmacy Consult for Lovenox >>apixaban Indication: atrial fibrillation/flutter  Allergies  Allergen Reactions   Diclofenac Swelling    Per pt, facial swelling  Other Reaction(s): Other (See Comments)  Per pt, facial swelling, Per pt, facial swelling    Patient Measurements: Height: 5\' 2"  (157.5 cm) Weight: (!) 142.4 kg (313 lb 15 oz) IBW/kg (Calculated) : 50.1   Vital Signs: Temp: 97.8 F (36.6 C) (08/03 0932) Temp Source: Oral (08/03 0932) BP: 132/81 (08/03 0932) Pulse Rate: 77 (08/03 0932)  Labs: Recent Labs    09/07/22 0501 09/07/22 1307 09/08/22 0324 09/09/22 0337  HGB 6.8* 9.0* 7.6* 9.0*  HCT 20.7* 26.9* 23.4* 28.2*  PLT 166 196 179 207  CREATININE 1.95*  --  1.81* 1.72*    Estimated Creatinine Clearance: 44.8 mL/min (A) (by C-G formula based on SCr of 1.72 mg/dL (H)).   Medical History: Past Medical History:  Diagnosis Date   Anemia    Arthritis    Cancer (HCC)    Claustrophobia 10/05/2014   Hypertension    Leukopenia 08/03/2014   Normocytic hypochromic anemia 08/03/2014   Renal disorder    stage 3      Assessment: Pt with afib/flutter this admission. Patient has been on/off anticoagulation with lovenox due to procedures. New orders to start apixaban tonight. Lovenox given this afternoon. Hemoglobin has fluctuated and is currently at 9 after transfusion yesterday.    Goal of Therapy:  Monitor platelets by anticoagulation protocol: Yes   Plan:  Start apixaban 5mg  bid tonight  Sheppard Coil PharmD., BCPS Clinical Pharmacist 09/09/2022 1:33 PM

## 2022-09-09 NOTE — Progress Notes (Signed)
[facility] Progress Note     Subjective: Doing well IR nephrostomy tube yesterday.   Objective: Vital signs in last 24 hours: Temp:  [97.4 F (36.3 C)-98.7 F (37.1 C)] 97.8 F (36.6 C) (08/03 0932) Pulse Rate:  [73-82] 77 (08/03 0932) Resp:  [12-31] 20 (08/03 0932) BP: (104-159)/(55-125) 132/81 (08/03 0932) SpO2:  [94 %-100 %] 98 % (08/03 0932) FiO2 (%):  [21 %] 21 % (08/02 2240) Last BM Date : 09/07/22  Intake/Output from previous day: 08/02 0701 - 08/03 0700 In: 1553.4 [P.O.:100; I.V.:1119.4; Blood:334] Out: 770 [Urine:770] Intake/Output this shift: Total I/O In: 1546.7 [P.O.:300; I.V.:1246.7] Out: 140 [Urine:140]  General appearance: alert and no distress GI: soft, nondistended, nontender  Lab Results:  Recent Labs    09/08/22 0324 09/09/22 0337  WBC 4.9 5.6  HGB 7.6* 9.0*  HCT 23.4* 28.2*  PLT 179 207   BMET Recent Labs    09/08/22 0324 09/09/22 0337  NA 135 135  K 3.2* 3.7  CL 107 108  CO2 21* 22  GLUCOSE 95 96  BUN 19 15  CREATININE 1.81* 1.72*  CALCIUM 8.1* 8.4*   PT/INR No results for input(s): "LABPROT", "INR" in the last 72 hours.  Studies/Results: CT GUIDED VISCERAL FLUID DRAIN BY PERC CATH  Result Date: 09/08/2022 INDICATION: 67 year old female with left-sided hydronephrosis. Her left kidney is low lying and nearly 20 cm from the skin surface. Initial attempts to image it with ultrasound were unsuccessful. Therefore, placement of a percutaneous nephrostomy tube will be performed with CT guidance. EXAM: CT IMAGE GUIDED DRAINAGE BY PERCUTANEOUS CATHETER COMPARISON:  CT abdomen/pelvis 09/04/2022 MEDICATIONS: Patient is on scheduled Q 24 hour intravenous Rocephin. No additional antibiotic prophylaxis was given. ANESTHESIA/SEDATION: Fentanyl 2 mcg IV; Versed 100 mg IV Moderate Sedation Time:  18 minutes The patient's vital signs and level of consciousness were continuously monitored during the procedure by the interventional  radiology nurse under my direct supervision. CONTRAST:  None. FLUOROSCOPY TIME:  None. COMPLICATIONS: None immediate. PROCEDURE: Informed written consent was obtained from the patient after a thorough discussion of the procedural risks, benefits and alternatives. All questions were addressed. Maximal Sterile Barrier Technique was utilized including caps, mask, sterile gowns, sterile gloves, sterile drape, hand hygiene and skin antiseptic. A timeout was performed prior to the initiation of the procedure. Initial CT imaging was performed. The low lying left kidney was successfully identified. A suitable skin entry site to allow puncture of the posterior lower pole was identified and marked. The skin was sterilely prepped and draped in the standard fashion using chlorhexidine skin prep. Local anesthesia was attained by infiltration with 1% lidocaine. A small dermatotomy was made. Under intermittent CT guidance, a 20 cm 18 gauge trocar needle was carefully advanced through the posterior lower pole and into the renal collecting system. There was return of clear yellow urine. A 0.035 wire was coiled in the renal collecting system. The percutaneous tract was then dilated to 10 [place]. A Cook 10 [place] all-purpose drainage catheter was advanced over the wire and formed. The tube was connected to bag drainage and secured to the skin with 0 Prolene suture and an adhesive fixation device. Post placement CT imaging demonstrates a well-positioned percutaneous nephrostomy tube formed in the inferior aspect of the renal pelvis. IMPRESSION: Successful placement of 10 French percutaneous nephrostomy tube using CT guidance. Electronically Signed   By: Malachy Moan M.D.   On: 09/08/2022 12:25   IR PATIENT EVAL TECH 0-60 MINS  Result Date: 09/08/2022  Falls, Heather H     09/08/2022  9:35 AM Pt transferred from [facility] to [facility] for left PCN. Dr. Meredeth Ide concerned that due to patient anatomy and body habitus, depth from skin to kidney  may be to deep for IR procedure. MD used ultrasound to confirm depth and pt procedure moved to CT to accommodate.    Anti-infectives: Anti-infectives (From admission, onward)    Start     Dose/Rate Route Frequency Ordered Stop   09/03/22 2115  azithromycin (ZITHROMAX) 500 mg in sodium chloride 0.9 % 250 mL IVPB  Status:  Discontinued        500 mg 250 mL/hr over 60 Minutes Intravenous Every 24 hours 09/03/22 2109 09/06/22 1826   09/03/22 2030  cefTRIAXone (ROCEPHIN) 2 g in sodium chloride 0.9 % 100 mL IVPB        2 g 200 mL/hr over 30 Minutes Intravenous Every 24 hours 09/03/22 2027 09/10/22 2029       Assessment/Plan: Patient with colovagainal fistula, hydronephrosis/ hydroureter s/p IR drain, getting treated for PNA. Overall improving.  Can follow up with me as an outpatient to discuss colostomy for colovaginal fistula, will need urology follow up too   Will make sure my office is aware Diet as tolerated   Future Appointments  Date Time Provider Department Center  09/14/2022 12:00 PM [facility] 1 [facility] [place] [facility]  09/28/2022 11:30 AM [facility] NURSE [facility] None  10/05/2022 12:00 PM Doreatha Massed, MD [facility] None     LOS: 6 days    Lucretia Roers 09/09/2022

## 2022-09-09 NOTE — Plan of Care (Signed)
  Problem: Education: Goal: Knowledge of General Education information will improve Description: Including pain rating scale, medication(s)/side effects and non-pharmacologic comfort measures Outcome: Progressing   Problem: Health Behavior/Discharge Planning: Goal: Ability to manage health-related needs will improve Outcome: Progressing   Problem: Clinical Measurements: Goal: Ability to maintain clinical measurements within normal limits will improve Outcome: Progressing Goal: Will remain free from infection Outcome: Progressing Goal: Diagnostic test results will improve Outcome: Progressing Goal: Respiratory complications will improve Outcome: Progressing Goal: Cardiovascular complication will be avoided Outcome: Progressing   Problem: Activity: Goal: Risk for activity intolerance will decrease Outcome: Progressing   Problem: Nutrition: Goal: Adequate nutrition will be maintained Outcome: Progressing   Problem: Coping: Goal: Level of anxiety will decrease Outcome: Progressing   Problem: Elimination: Goal: Will not experience complications related to bowel motility Outcome: Progressing Goal: Will not experience complications related to urinary retention Outcome: Progressing   Problem: Pain Managment: Goal: General experience of comfort will improve Outcome: Progressing   

## 2022-09-10 DIAGNOSIS — A419 Sepsis, unspecified organism: Secondary | ICD-10-CM | POA: Diagnosis not present

## 2022-09-10 DIAGNOSIS — J189 Pneumonia, unspecified organism: Secondary | ICD-10-CM | POA: Diagnosis not present

## 2022-09-10 LAB — BASIC METABOLIC PANEL WITH GFR
Anion gap: 8 (ref 5–15)
BUN: 15 mg/dL (ref 8–23)
CO2: 21 mmol/L — ABNORMAL LOW (ref 22–32)
Calcium: 8.4 mg/dL — ABNORMAL LOW (ref 8.9–10.3)
Chloride: 107 mmol/L (ref 98–111)
Creatinine, Ser: 1.72 mg/dL — ABNORMAL HIGH (ref 0.44–1.00)
GFR, Estimated: 33 mL/min — ABNORMAL LOW (ref >60)
Glucose, Bld: 86 mg/dL (ref 70–99)
Potassium: 3.6 mmol/L (ref 3.5–5.1)
Sodium: 136 mmol/L (ref 135–145)

## 2022-09-10 LAB — CBC
HCT: 26.9 % — ABNORMAL LOW (ref 36.0–46.0)
Hemoglobin: 8.7 g/dL — ABNORMAL LOW (ref 12.0–15.0)
MCH: 29.5 pg (ref 26.0–34.0)
MCHC: 32.3 g/dL (ref 30.0–36.0)
MCV: 91.2 fL (ref 80.0–100.0)
Platelets: 237 10*3/uL (ref 150–400)
RBC: 2.95 MIL/uL — ABNORMAL LOW (ref 3.87–5.11)
RDW: 16.3 % — ABNORMAL HIGH (ref 11.5–15.5)
WBC: 4.7 10*3/uL (ref 4.0–10.5)
nRBC: 0 % (ref 0.0–0.2)

## 2022-09-10 LAB — GLUCOSE, CAPILLARY
Glucose-Capillary: 67 mg/dL — ABNORMAL LOW (ref 70–99)
Glucose-Capillary: 92 mg/dL (ref 70–99)

## 2022-09-10 MED ORDER — AMIODARONE HCL 200 MG PO TABS: ORAL_TABLET | ORAL | 0 refills | Status: DC

## 2022-09-10 MED ORDER — APIXABAN 5 MG PO TABS: 5.0000 mg | ORAL_TABLET | Freq: Two times a day (BID) | ORAL | 3 refills | Status: DC

## 2022-09-10 MED ORDER — OXYCODONE-ACETAMINOPHEN 5-325 MG PO TABS
1.0000 | ORAL_TABLET | Freq: Four times a day (QID) | ORAL | 0 refills | Status: DC | PRN
Start: 2022-09-10 — End: 2022-09-10

## 2022-09-10 MED ORDER — OXYCODONE-ACETAMINOPHEN 5-325 MG PO TABS: 1.0000 | ORAL_TABLET | Freq: Four times a day (QID) | ORAL | 0 refills | Status: DC | PRN

## 2022-09-10 MED ORDER — HEPARIN SOD (PORK) LOCK FLUSH 100 UNIT/ML IV SOLN
500.0000 [IU] | Freq: Once | INTRAVENOUS | Status: AC
  Administered 2022-09-10: 500 [IU] via INTRAVENOUS
  Filled 2022-09-10: qty 5

## 2022-09-10 NOTE — Discharge Summary (Signed)
[facility]  Physician Discharge Summary   Patient ID: Kelli Hurley MRN: [id number] DOB/AGE: 1956/01/05 67 y.o.  Admit date: 09/03/2022 Discharge date: 09/10/2022    PCP: [facility]  DISCHARGE DIAGNOSES:    Sepsis due to pneumonia [facility])   Multiple myeloma (HCC)   Morbid obesity with BMI of 50.0-59.9, adult (HCC)   Essential hypertension   OSA (obstructive sleep apnea)   Hyponatremia   Acute kidney injury superimposed on CKD (HCC)   Hypoalbuminemia due to protein-calorie malnutrition (HCC)   Hypokalemia   Mixed hyperlipidemia   Colovaginal fistula   Hydronephrosis due to obstruction of ureter   RECOMMENDATIONS FOR OUTPATIENT FOLLOW UP: Outpatient follow-up with interventional radiology for nephrostomy tube check in 8 weeks General surgery to arrange outpatient follow-up. Needs to follow-up with urology as well.   Home Health: Home health PT OT Equipment/Devices: None  CODE STATUS: Full code  DISCHARGE CONDITION: fair  Diet recommendation: Heart healthy  INITIAL HISTORY: 67 y.o. female with medical history significant of multiple myeloma in remission with no evidence of cardiac amyloidosis per cardiac MRI in 2021, hysterectomy, OSA on CPAP, CKD stage III, HTN, HLD admitted on 09/03/2022 with concerns for sepsis due to pneumonia. She was found to have septic shock with concern for intraabdominal abscess related to colovesical/colovaginal fistula as well as new atrial flutter. Pressors weaned 7/29, remains stable on ceftriaxone and azithromycin. Surgery, urology, GYN consulted, recommend delayed procedures while unstable. There was left hydroureteronephrosis related to adhesions for which IR was consulted and placed percutaneous nephrostomy tube 8/2 after lovenox washout and transfusion.    HOSPITAL COURSE:   Septic shock due to LLL pneumonia and intra-abdominal abscess in the setting of colovagina fistula. Liberated from pressors,  normalized lactic acid. MRSA, RSV, flu, covid PCR's negative. Blood cultures NGTD, sputum culture not testable.  - +Pneumococcal urine antigen, continue ceftriaxone, will complete 7 days Sunday. - Patient with questionable history of recto-vaginal fistula. 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  - CT findings discussed with Dr. Henreitta Leber who will follow up as outpatient to discuss colectomy.  - CT findings discussed with gynecologist Dr. Despina Hidden who agrees with outpatient management. - CT findings discussed with urologist Dr. Ronne Binning and Dr. Alvester Morin, will need urology follow up.    Progressive normocytic anemia: Primarily suspect hemodilution and myeloma-related anemia as well as AOCKD.    Multiple myeloma: Patient has relapsed IgG kappa multiple myeloma with high risk features - While admitted, holding pomalidomide 4 mg 3 weeks on/1 week off. Continue dexamethasone 20 mg weekly  - She follows with Dr. Ellin Saba with last visit being on 06/28/2022   Morbid obesity: Body mass index is 57.42 kg/m.  - Continue semaglutide - Still needs PT evaluation to inform disposition venue.   OSA:  - CPAP qHS encouraged.     Obstructive uropathy, AKI on stage IIIb CKD: Urology feels ureteral stenting would have high failure rate. - SCr 3.34 >> 1.81 near baseline. - Avoid nephrotoxins - s/p left PCN 8/2 by Dr. Archer Asa. Return to IR in 8 weeks for tube check and change.    New onset paroxysmal atrial flutter: Back in NSR. Recent TSH 0.636. - Convert IV amiodarone to PO with load, will follow up with cardiology.  Continue Eliquis - LVEF 50-55%, mild LAE, D2GG.   Hyponatremia/hypokalemia/hypochloremia/hypophosphatemia:  Essential hypertension   Patient is stable.  Wants to go home today.  Cleared for discharge.   PERTINENT  LABS:  The results of significant diagnostics from this hospitalization (including imaging, microbiology, ancillary and laboratory) are listed below for reference.    Microbiology: Recent Results (from the past 240 hour(s))  Resp panel by RT-PCR (RSV, Flu A&B, Covid) Anterior Nasal Swab     Status: None   Collection Time: 09/03/22  8:25 PM   Specimen: Anterior Nasal Swab  Result Value Ref Range Status   SARS Coronavirus 2 by RT PCR NEGATIVE NEGATIVE Final    Comment: (NOTE) SARS-CoV-2 target nucleic acids are NOT DETECTED.  The SARS-CoV-2 RNA is generally detectable in upper respiratory specimens during the acute phase of infection. The lowest concentration of SARS-CoV-2 viral copies this assay can detect is 138 copies/mL. A negative result does not preclude SARS-Cov-2 infection and should not be used as the sole basis for treatment or other patient management decisions. A negative result may occur with  improper specimen collection/handling, submission of specimen other than nasopharyngeal swab, presence of viral mutation(s) within the areas targeted by this assay, and inadequate number of viral copies(<138 copies/mL). A negative result must be combined with clinical observations, patient history, and epidemiological information. The expected result is Negative.  Fact Sheet for Patients:  BloggerCourse.com  Fact Sheet for Healthcare Providers:  SeriousBroker.it  This test is no t yet approved or cleared by the [place] FDA and  has been authorized for detection and/or diagnosis of SARS-CoV-2 by FDA under an Emergency Use Authorization (EUA). This EUA will remain  in effect (meaning this test can be used) for the duration of the COVID-19 declaration under Section 564(b)(1) of the Act, 21 U.S.C.section 360bbb-3(b)(1), unless the authorization is terminated  or revoked sooner.       Influenza A by PCR NEGATIVE NEGATIVE  Final   Influenza B by PCR NEGATIVE NEGATIVE Final    Comment: (NOTE) The Xpert Xpress SARS-CoV-2/FLU/RSV plus assay is intended as an aid in the diagnosis of influenza from Nasopharyngeal swab specimens and should not be used as a sole basis for treatment. Nasal washings and aspirates are unacceptable for Xpert Xpress SARS-CoV-2/FLU/RSV testing.  Fact Sheet for Patients: BloggerCourse.com  Fact Sheet for Healthcare Providers: SeriousBroker.it  This test is not yet approved or cleared by the [place] FDA and has been authorized for detection and/or diagnosis of SARS-CoV-2 by FDA under an Emergency Use Authorization (EUA). This EUA will remain in effect (meaning this test can be used) for the duration of the COVID-19 declaration under Section 564(b)(1) of the Act, 21 U.S.C. section 360bbb-3(b)(1), unless the authorization is terminated or revoked.     Resp Syncytial Virus by PCR NEGATIVE NEGATIVE Final    Comment: (NOTE) Fact Sheet for Patients: BloggerCourse.com  Fact Sheet for Healthcare Providers: SeriousBroker.it  This test is not yet approved or cleared by the [place] FDA and has been authorized for detection and/or diagnosis of SARS-CoV-2 by FDA under an Emergency Use Authorization (EUA). This EUA will remain in effect (meaning this test can be used) for the duration of the COVID-19 declaration under Section 564(b)(1) of the Act, 21 U.S.C. section 360bbb-3(b)(1), unless the authorization is terminated or revoked.  Performed at [facility], [address] [place], [place] [address]   Blood Culture (routine x 2)     Status: None   Collection Time: 09/03/22  8:53 PM   Specimen: Left Antecubital; Blood  Result Value  Ref Range Status   Specimen Description LEFT ANTECUBITAL  Final   Special Requests   Final    BOTTLES DRAWN AEROBIC AND ANAEROBIC Blood Culture  results may not be optimal due to an excessive volume of blood received in culture bottles   Culture   Final    NO GROWTH 5 DAYS Performed at [facility], [address], [place], [place] [address]    Report Status 09/08/2022 FINAL  Final  Blood Culture (routine x 2)     Status: None   Collection Time: 09/03/22  9:02 PM   Specimen: Right Antecubital; Blood  Result Value Ref Range Status   Specimen Description RIGHT ANTECUBITAL  Final   Special Requests   Final    BOTTLES DRAWN AEROBIC AND ANAEROBIC Blood Culture results may not be optimal due to an excessive volume of blood received in culture bottles   Culture   Final    NO GROWTH 5 DAYS Performed at [facility], [address], [place], [place] [address]    Report Status 09/08/2022 FINAL  Final  MRSA Next Gen by PCR, Nasal     Status: None   Collection Time: 09/04/22 12:00 AM  Result Value Ref Range Status   MRSA by PCR Next Gen NOT DETECTED NOT DETECTED Final    Comment: (NOTE) The GeneXpert MRSA Assay (FDA approved for NASAL specimens only), is one component of a comprehensive MRSA colonization surveillance program. It is not intended to diagnose MRSA infection nor to guide or monitor treatment for MRSA infections. Test performance is not FDA approved in patients less than 86 years old. Performed at [facility], [address], [place], [place] [address]   Expectorated Sputum Assessment w Gram Stain, Rflx to Resp Cult     Status: None   Collection Time: 09/04/22 11:41 AM   Specimen: Sputum  Result Value Ref Range Status   Specimen Description SPU  Final   Special Requests NONE  Final   Sputum evaluation   Final    Sputum specimen not acceptable for testing.  Please recollect.   Gram Stain Report Called to,Read Back By and Verified With: Caralee Ates [phone] 09/04/22 [facility] Performed at [facility], [address], [place], [place] [address]    Report Status 09/04/2022 FINAL  Final     Labs:   Basic Metabolic Panel: Recent  Labs  Lab 09/05/22 0459 09/06/22 0440 09/07/22 0501 09/08/22 0324 09/09/22 0337 09/10/22 0535  NA 133* 134* 134* 135 135 136  K 3.4* 3.5 3.2* 3.2* 3.7 3.6  CL 100 102 104 107 108 107  CO2 24 22 22  21* 22 21*  GLUCOSE 130* 98 96 95 96 86  BUN 39* 32* 23 19 15 15   CREATININE 2.62* 2.26* 1.95* 1.81* 1.72* 1.72*  CALCIUM 8.0* 8.0* 8.1* 8.1* 8.4* 8.4*  MG 1.8  --  1.7  --   --   --   PHOS 1.9* 2.8  --   --   --   --    Liver Function Tests: Recent Labs  Lab 09/06/22 0440  ALBUMIN 2.5*    CBC: Recent Labs  Lab 09/07/22 0501 09/07/22 1307 09/08/22 0324 09/09/22 0337 09/10/22 0535  WBC 4.1 5.4 4.9 5.6 4.7  HGB 6.8* 9.0* 7.6* 9.0* 8.7*  HCT 20.7* 26.9* 23.4* 28.2* 26.9*  MCV 91.2 90.0 90.3 91.9 91.2  PLT 166 196 179 207 237     CBG: Recent Labs  Lab 09/09/22 1142 09/09/22 1625 09/09/22 2107 09/10/22 4132  09/10/22 0901  GLUCAP 83 90 101* 67* 92     IMAGING STUDIES CT GUIDED VISCERAL FLUID DRAIN BY PERC CATH  Result Date: 09/08/2022 INDICATION: 67 year old female with left-sided hydronephrosis. Her left kidney is low lying and nearly 20 cm from the skin surface. Initial attempts to image it with ultrasound were unsuccessful. Therefore, placement of a percutaneous nephrostomy tube will be performed with CT guidance. EXAM: CT IMAGE GUIDED DRAINAGE BY PERCUTANEOUS CATHETER COMPARISON:  CT abdomen/pelvis 09/04/2022 MEDICATIONS: Patient is on scheduled Q 24 hour intravenous Rocephin. No additional antibiotic prophylaxis was given. ANESTHESIA/SEDATION: Fentanyl 2 mcg IV; Versed 100 mg IV Moderate Sedation Time:  18 minutes The patient's vital signs and level of consciousness were continuously monitored during the procedure by the interventional radiology nurse under my direct supervision. CONTRAST:  None. FLUOROSCOPY TIME:  None. COMPLICATIONS: None immediate. PROCEDURE: Informed written consent was obtained from the patient after a thorough discussion of the procedural risks,  benefits and alternatives. All questions were addressed. Maximal Sterile Barrier Technique was utilized including caps, mask, sterile gowns, sterile gloves, sterile drape, hand hygiene and skin antiseptic. A timeout was performed prior to the initiation of the procedure. Initial CT imaging was performed. The low lying left kidney was successfully identified. A suitable skin entry site to allow puncture of the posterior lower pole was identified and marked. The skin was sterilely prepped and draped in the standard fashion using chlorhexidine skin prep. Local anesthesia was attained by infiltration with 1% lidocaine. A small dermatotomy was made. Under intermittent CT guidance, a 20 cm 18 gauge trocar needle was carefully advanced through the posterior lower pole and into the renal collecting system. There was return of clear yellow urine. A 0.035 wire was coiled in the renal collecting system. The percutaneous tract was then dilated to 10 [place]. A Cook 10 [place] all-purpose drainage catheter was advanced over the wire and formed. The tube was connected to bag drainage and secured to the skin with 0 Prolene suture and an adhesive fixation device. Post placement CT imaging demonstrates a well-positioned percutaneous nephrostomy tube formed in the inferior aspect of the renal pelvis. IMPRESSION: Successful placement of 10 French percutaneous nephrostomy tube using CT guidance. Electronically Signed   By: Malachy Moan M.D.   On: 09/08/2022 12:25   IR PATIENT EVAL TECH 0-60 MINS  Result Date: 09/08/2022 Jacqualine Code     09/08/2022  9:35 AM Pt transferred from [facility] to [facility] for left PCN. Dr. Meredeth Ide concerned that due to patient anatomy and body habitus, depth from skin to kidney may be to deep for IR procedure. MD used ultrasound to confirm depth and pt procedure moved to CT to accommodate.   ECHOCARDIOGRAM COMPLETE  Result Date: 09/07/2022    ECHOCARDIOGRAM REPORT   Patient Name:   MARYLOU PASKE Date of  Exam: 09/06/2022 Medical Rec #:  [id number]          Height:       62.0 in Accession #:    [id number]         Weight:       312.4 lb Date of Birth:  04/08/1955         BSA:          2.311 m Patient Age:    66 years           BP:           113/71 mmHg Patient Gender: F  HR:           83 bpm. [facility] Procedure: 2D Echo, Color Doppler and Cardiac Doppler Indications:    Abnormal ECG R94.31  History:        Patient has prior history of Echocardiogram examinations, most                 recent 10/28/2021. Sepsis; Risk Factors:Sleep Apnea,                 Hypertension, Dyslipidemia and Non-Smoker.  Sonographer:    Aron Baba Referring Phys: [id number] COURAGE EMOKPAE  Sonographer Comments: Patient is obese and suboptimal parasternal window. IMPRESSIONS  1. Left ventricular ejection fraction, by estimation, is 50 to 55%. The left ventricle has low normal function. The left ventricle has no regional wall motion abnormalities. Left ventricular diastolic parameters are consistent with Grade II diastolic dysfunction (pseudonormalization). Elevated left atrial pressure.  2. Right ventricular systolic function is normal. The right ventricular size is normal. There is normal pulmonary artery systolic pressure.  3. Left atrial size was mildly dilated.  4. The mitral valve is abnormal. Mild mitral valve regurgitation. No evidence of mitral stenosis.  5. The aortic valve was not well visualized. Aortic valve regurgitation is not visualized. No aortic stenosis is present.  6. Aortic dilatation noted. There is mild dilatation of the ascending aorta, measuring 37 mm.  7. The inferior vena cava is normal in size with greater than 50% respiratory variability, suggesting right atrial pressure of 3 mmHg. FINDINGS  Left Ventricle: Left ventricular ejection fraction, by estimation, is 50 to 55%. The left ventricle has low normal function. The left ventricle has no regional wall motion abnormalities. The left  ventricular internal cavity size was normal in size. There is no left ventricular hypertrophy. Left ventricular diastolic parameters are consistent with Grade II diastolic dysfunction (pseudonormalization). Elevated left atrial pressure. Right Ventricle: The right ventricular size is normal. Right vetricular wall thickness was not well visualized. Right ventricular systolic function is normal. There is normal pulmonary artery systolic pressure. The tricuspid regurgitant velocity is 1.51 m/s, and with an assumed right atrial pressure of 3 mmHg, the estimated right ventricular systolic pressure is 12.1 mmHg. Left Atrium: Left atrial size was mildly dilated. Right Atrium: Right atrial size was normal in size. Pericardium: There is no evidence of pericardial effusion. Mitral Valve: The mitral valve is abnormal. Mild mitral valve regurgitation. No evidence of mitral valve stenosis. Tricuspid Valve: The tricuspid valve is normal in structure. Tricuspid valve regurgitation is trivial. No evidence of tricuspid stenosis. Aortic Valve: The aortic valve was not well visualized. Aortic valve regurgitation is not visualized. No aortic stenosis is present. Aortic valve mean gradient measures 7.0 mmHg. Aortic valve peak gradient measures 13.4 mmHg. Pulmonic Valve: The pulmonic valve was not well visualized. Pulmonic valve regurgitation is mild. No evidence of pulmonic stenosis. Aorta: The aortic root is normal in size and structure and aortic dilatation noted. There is mild dilatation of the ascending aorta, measuring 37 mm. Venous: The inferior vena cava is normal in size with greater than 50% respiratory variability, suggesting right atrial pressure of 3 mmHg. IAS/Shunts: No atrial level shunt detected by color flow Doppler.  LEFT VENTRICLE PLAX 2D LVIDd:         4.60 cm      Diastology LVIDs:         3.60 cm      LV e' medial:    6.53 cm/s LV PW:  0.80 cm      LV E/e' medial:  19.8 LV IVS:        0.80 cm      LV e'  lateral:   10.10 cm/s LVOT diam:     2.20 cm      LV E/e' lateral: 12.8 LVOT Area:     3.80 cm  LV Volumes (MOD) LV vol d, MOD A2C: 141.0 ml LV vol d, MOD A4C: 139.0 ml LV vol s, MOD A2C: 71.3 ml LV vol s, MOD A4C: 66.6 ml LV SV MOD A2C:     69.7 ml LV SV MOD A4C:     139.0 ml LV SV MOD BP:      72.2 ml RIGHT VENTRICLE RV S prime:     12.40 cm/s TAPSE (M-mode): 2.2 cm LEFT ATRIUM             Index        RIGHT ATRIUM           Index LA diam:        2.80 cm 1.21 cm/m   RA Area:     15.40 cm LA Vol (A2C):   84.9 ml 36.74 ml/m  RA Volume:   36.00 ml  15.58 ml/m LA Vol (A4C):   96.6 ml 41.81 ml/m LA Biplane Vol: 96.7 ml 41.85 ml/m  AORTIC VALVE               PULMONIC VALVE AV Vmax:      183.00 cm/s  PR End Diast Vel: 5.24 msec AV Vmean:     125.000 cm/s AV VTI:       0.348 m AV Peak Grad: 13.4 mmHg AV Mean Grad: 7.0 mmHg  AORTA Ao Root diam: 3.70 cm Ao Asc diam:  3.70 cm MITRAL VALVE                 TRICUSPID VALVE MV Area (PHT): 3.60 cm      TR Peak grad:   9.1 mmHg MV Decel Time: 211 msec      TR Vmax:        151.00 cm/s MR Peak grad:   95.0 mmHg MR Mean grad:   72.0 mmHg    SHUNTS MR Vmax:        487.33 cm/s  Systemic Diam: 2.20 cm MR Vmean:       405.0 cm/s MR PISA:        0.25 cm MR PISA Radius: 0.20 cm MV E velocity: 129.00 cm/s MV A velocity: 85.00 cm/s MV E/A ratio:  1.52 Dina Rich MD Electronically signed by Dina Rich MD Signature Date/Time: 09/07/2022/12:38:48 PM    Final    CT ABDOMEN PELVIS WO CONTRAST  Result Date: 09/04/2022 CLINICAL DATA:  Pelvic pain.  Suspected recto vaginal fistula. EXAM: CT ABDOMEN AND PELVIS WITHOUT CONTRAST TECHNIQUE: Multidetector CT imaging of the abdomen and pelvis was performed following the standard protocol without IV contrast. RADIATION DOSE REDUCTION: This exam was performed according to the departmental dose-optimization program which includes automated exposure control, adjustment of the mA and/or kV according to patient size and/or use of iterative  reconstruction technique. COMPARISON:  None Available. FINDINGS: Evaluation of this exam is limited in the absence of intravenous contrast as well as due to respiratory motion. Lower chest: Trace left pleural effusion. There are bibasilar linear atelectasis or scarring. The tip of a central venous line is seen at the cavoatrial junction. No intra-abdominal free air or free fluid. Hepatobiliary: Probable mild  fatty liver. No biliary dilatation. The gallbladder is unremarkable. Pancreas: Unremarkable. No pancreatic ductal dilatation or surrounding inflammatory changes. Spleen: Normal in size without focal abnormality. Adrenals/Urinary Tract: The adrenal glands are unremarkable. There is moderate left hydronephrosis. There is mild left hydroureter. A transition is noted in the distal left ureter, possibly related to adhesions. No stone identified. There is areas of cortical irregularity and scarring in the upper pole of the left kidney. There is no hydronephrosis or nephrolithiasis on the right. The right ureter is unremarkable. The urinary bladder is collapsed. Stomach/Bowel: Mild sigmoid diverticulosis. There is an area of inflammatory changes and stranding adjacent to the sigmoid colon with small pockets of extraluminal air. Findings may represent diverticulitis with focally contained perforation or developing abscess. No drainable fluid collection at this time. There is tethering of the dome of the bladder, vaginal cuff, distal left ureter and rectum consistent with adhesions. There is faint oral contrast in the region of the tethering which may be extraluminal (99/6 and axial 72/2) and may represent a fistulous track. There is no bowel obstruction. The appendix is normal. Vascular/Lymphatic: Mild aortoiliac atherosclerotic disease. The IVC is unremarkable. No portal venous gas. There is no adenopathy. Reproductive: Hysterectomy.  No adnexal masses. Other: None Musculoskeletal: Osteopenia with degenerative changes  of the spine. No acute osseous pathology. IMPRESSION: 1. Tethering of the bladder dome, sigmoid colon, vaginal cough, and distal left ureter consistent with adhesion. 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 Fluoroscopic enema study may provide better evaluation. 2. Ill-defined area of inflammation and tethering in the pelvis with small pockets of air adjacent to the sigmoid colon as above. Perforated sigmoid diverticulitis is not excluded. No drainable fluid collection at this time. 3. Moderate left hydronephrosis and mild left hydroureter with a transition in the distal left ureter, possibly related to adhesions. No stone identified. 4.  Aortic Atherosclerosis (ICD10-I70.0). Electronically Signed   By: Elgie Collard M.D.   On: 09/04/2022 20:41   DG Chest Port 1 View  Result Date: 09/03/2022 CLINICAL DATA:  Possible sepsis. EXAM: PORTABLE CHEST 1 VIEW COMPARISON:  08/06/2019. FINDINGS: Heart is enlarged and the mediastinal contour is within normal limits. Lung volumes are low with mild airspace disease at the left lung base. No definite effusion or pneumothorax. A left chest port is stable in position. No acute osseous abnormality. IMPRESSION: 1. Mild airspace disease at the left lung base, possible atelectasis or infiltrate. 2. Cardiomegaly. Electronically Signed   By: Thornell Sartorius M.D.   On: 09/03/2022 20:52    DISCHARGE EXAMINATION: Vitals:   09/09/22 2230 09/09/22 2300 09/09/22 2345 09/10/22 0314  BP:   120/80 136/75  Pulse:   77 81  Resp: (!) 27 17 18 19   Temp:   97.8 F (36.6 C) 97.7 F (36.5 C)  TempSrc:   Oral Oral  SpO2:   95% 100%  Weight:      Height:       General appearance: Awake alert.  In no distress Resp: Clear to auscultation bilaterally.  Normal effort Cardio: S1-S2 is normal regular.  No S3-S4.  No rubs murmurs or bruit   DISPOSITION: Home with family  Discharge Instructions     Call MD  for:  difficulty breathing, headache or visual disturbances   Complete by: As directed    Call MD for:  extreme fatigue   Complete by: As directed    Call MD for:  persistant dizziness or light-headedness  Complete by: As directed    Call MD for:  persistant nausea and vomiting   Complete by: As directed    Call MD for:  severe uncontrolled pain   Complete by: As directed    Call MD for:  temperature >100.4   Complete by: As directed    Diet - low sodium heart healthy   Complete by: As directed    Discharge instructions   Complete by: As directed    Please take your medications as prescribed.  Please be sure to follow-up with your primary care provider within 1 week.  You will also need to follow-up with the urologist.  A message has been sent to cardiology to arrange outpatient follow-up.  The general surgeon will also need to follow-up with you.  You were cared for by a hospitalist during your hospital stay. If you have any questions about your discharge medications or the care you received while you were in the hospital after you are discharged, you can call the unit and asked to speak with the hospitalist on call if the hospitalist that took care of you is not available. Once you are discharged, your primary care physician will handle any further medical issues. Please note that NO REFILLS for any discharge medications will be authorized once you are discharged, as it is imperative that you return to your primary care physician (or establish a relationship with a primary care physician if you do not have one) for your aftercare needs so that they can reassess your need for medications and monitor your lab values. If you do not have a primary care physician, you can call [phone] for a physician referral.   Increase activity slowly   Complete by: As directed    No wound care   Complete by: As directed          Allergies as of 09/10/2022       Reactions   Diclofenac Swelling   Per  pt, facial swelling Other Reaction(s): Other (See Comments) Per pt, facial swelling, Per pt, facial swelling        Medication List     STOP taking these medications    aspirin EC 81 MG tablet   losartan 25 MG tablet Commonly known as: COZAAR       TAKE these medications    acetaminophen 650 MG CR tablet Commonly known as: TYLENOL Take 650 mg by mouth every 8 (eight) hours as needed for pain.   albuterol 108 (90 Base) MCG/ACT inhaler Commonly known as: VENTOLIN HFA Inhale 2 puffs into the lungs every 6 (six) hours as needed for wheezing or shortness of breath.   amiodarone 200 MG tablet Commonly known as: Pacerone Take 2 tablets twice a day for 5 days followed by 1 tablet twice a day for 7 days followed by 1 tablet once a day to you have been seen by the cardiologist.   apixaban 5 MG Tabs tablet Commonly known as: ELIQUIS Take 1 tablet (5 mg total) by mouth 2 (two) times daily.   calcitRIOL 0.25 MCG capsule Commonly known as: ROCALTROL Take 0.25 mcg by mouth daily.   Dermacloud Oint Apply to affected area as needed Externally for 30 days   dexamethasone 4 MG tablet Commonly known as: DECADRON TAKE FIVE TABLETS BY MOUTH ONCE A WEEK   furosemide 20 MG tablet Commonly known as: LASIX TAKE 2 TABLETS BY MOUTH DAILY IN THE MORNING AND TAKE 2 TABLETS AT BEDTIME What changed: See the new instructions.  lidocaine-prilocaine cream Commonly known as: EMLA APPLY 1 QUARTER SIZED AMOUNT 1 HOUR PRIOR TO TREATMENT   magnesium oxide 400 (240 Mg) MG tablet Commonly known as: MAG-OX TAKE 1 TABLET BY MOUTH TWICE DAILY   metolazone 2.5 MG tablet Commonly known as: ZAROXOLYN Take 1 tablet (2.5 mg total) by mouth daily. What changed:  when to take this additional instructions   oxyCODONE-acetaminophen 5-325 MG tablet Commonly known as: PERCOCET/ROXICET Take 1 tablet by mouth every 6 (six) hours as needed for up to 15 days for severe pain. TAKE 1 OR 2 TABLETS BY MOUTH  EVERY TWELVE HOURS AS NEEDED for severe pain What changed:  how much to take how to take this when to take this reasons to take this   Ozempic (2 MG/DOSE) 8 MG/3ML Sopn Generic drug: Semaglutide (2 MG/DOSE) Inject 2 mg into the skin once a week.   pomalidomide 4 MG capsule Commonly known as: POMALYST Take 1 capsule (4 mg total) by mouth daily. 21 days on, 7 days off   potassium chloride SA 20 MEQ tablet Commonly known as: KLOR-CON M TAKE 1 TABLET BY MOUTH THREE TIMES DAILY What changed:  how much to take how to take this when to take this additional instructions   pregabalin 75 MG capsule Commonly known as: Lyrica Take 1 capsule (75 mg total) by mouth daily.   rosuvastatin 10 MG tablet Commonly known as: Crestor Take 1 tablet (10 mg total) by mouth daily.   Tab-A-Vite Tabs TAKE 1 TABLET BY MOUTH ONCE DAILY.   trolamine salicylate 10 % cream Commonly known as: ASPERCREME Apply 1 application topically 2 (two) times daily as needed for muscle pain.   VITAMIN D PO Take by mouth.          Follow-up Information     [facility], [facility] Follow up.   Specialty: Home Health Services Why: Will contact you to schedule home health visits. Contact information: [address] [place] [address] [phone]         Lucretia Roers, MD Follow up on 10/03/2022.   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 Schedule an appointment as soon as possible for a visit in 1 week(s).   Specialty: Urology Why: post hospitalization follow up Contact information: [address]  [address] [place] [address] [phone]         [facility] Schedule an appointment as soon as possible for a visit in 1 week(s).   Why: post hospitalization follow up Contact information: [address] [place] [address] [phone]                 TOTAL DISCHARGE TIME: 35 minutes  Alyce Inscore Rito Ehrlich  [facility] Pager on [email or web address]  09/11/2022, 11:47 AM

## 2022-09-13 ENCOUNTER — Other Ambulatory Visit: Payer: Self-pay

## 2022-09-13 DIAGNOSIS — G8929 Other chronic pain: Secondary | ICD-10-CM

## 2022-09-13 MED ORDER — OXYCODONE-ACETAMINOPHEN 5-325 MG PO TABS: 1.0000 | ORAL_TABLET | Freq: Two times a day (BID) | ORAL | 0 refills | Status: DC | PRN

## 2022-09-14 ENCOUNTER — Ambulatory Visit (HOSPITAL_COMMUNITY): Payer: 59

## 2022-09-19 ENCOUNTER — Other Ambulatory Visit: Payer: Self-pay

## 2022-09-19 MED ORDER — POMALIDOMIDE 4 MG PO CAPS: 4.0000 mg | ORAL_CAPSULE | Freq: Every day | ORAL | 0 refills | Status: DC

## 2022-09-19 NOTE — Telephone Encounter (Signed)
Chart reviewed. Revlimid refilled per last office note with Dr. Katragadda.  

## 2022-09-20 ENCOUNTER — Other Ambulatory Visit: Payer: Self-pay

## 2022-09-20 DIAGNOSIS — Z95828 Presence of other vascular implants and grafts: Secondary | ICD-10-CM

## 2022-09-20 DIAGNOSIS — C9000 Multiple myeloma not having achieved remission: Secondary | ICD-10-CM

## 2022-09-20 DIAGNOSIS — D508 Other iron deficiency anemias: Secondary | ICD-10-CM

## 2022-09-26 ENCOUNTER — Encounter: Payer: Self-pay | Admitting: Cardiology

## 2022-09-26 ENCOUNTER — Ambulatory Visit: Payer: 59 | Attending: Cardiology | Admitting: Cardiology

## 2022-09-26 VITALS — BP 112/64 | HR 76 | Ht 62.0 in | Wt 281.8 lb

## 2022-09-26 DIAGNOSIS — G4733 Obstructive sleep apnea (adult) (pediatric): Secondary | ICD-10-CM

## 2022-09-26 DIAGNOSIS — I10 Essential (primary) hypertension: Secondary | ICD-10-CM

## 2022-09-26 DIAGNOSIS — J189 Pneumonia, unspecified organism: Secondary | ICD-10-CM

## 2022-09-26 DIAGNOSIS — N183 Chronic kidney disease, stage 3 unspecified: Secondary | ICD-10-CM | POA: Diagnosis not present

## 2022-09-26 DIAGNOSIS — C9000 Multiple myeloma not having achieved remission: Secondary | ICD-10-CM

## 2022-09-26 DIAGNOSIS — I483 Typical atrial flutter: Secondary | ICD-10-CM

## 2022-09-26 NOTE — Patient Instructions (Signed)
Medication Instructions:   Stop Taking Amiodarone   *If you need a refill on your cardiac medications before your next appointment, please call your pharmacy*   Lab Work: NONE   If you have labs (blood work) drawn today and your tests are completely normal, you will receive your results only by: MyChart Message (if you have MyChart) OR A paper copy in the mail If you have any lab test that is abnormal or we need to change your treatment, we will call you to review the results.   Testing/Procedures: NONE    Follow-Up: At [facility], you and your health needs are our priority.  As part of our continuing mission to provide you with exceptional heart care, we have created designated Provider Care Teams.  These Care Teams include your primary Cardiologist (physician) and Advanced Practice Providers (APPs -  Physician Assistants and Nurse Practitioners) who all work together to provide you with the care you need, when you need it.  We recommend signing up for the patient portal called "MyChart".  Sign up information is provided on this After Visit Summary.  MyChart is used to connect with patients for Virtual Visits (Telemedicine).  Patients are able to view lab/test results, encounter notes, upcoming appointments, etc.  Non-urgent messages can be sent to your provider as well.   To learn more about what you can do with MyChart, go to [email or web address].    Your next appointment:   3 month(s)  Provider:   You may see Vishnu P Mallipeddi, MD or one of the following Advanced Practice Providers on your designated Care Team:   Randall An, PA-C  Jacolyn Reedy, PA-C     Other Instructions Thank you for choosing [facility]!

## 2022-09-26 NOTE — Progress Notes (Signed)
Cardiology Office Note:  .   Date:  09/26/2022  ID:  Kelli Hurley, DOB April 04, 1955, MRN [id number] PCP: [facility]   HeartCare Providers Cardiologist:  Marjo Bicker, MD    History of Present Illness: .   Kelli Hurley is a 67 y.o. female with past medical history of hypertension, OSA on CPAP, multiple myeloma in remission with no evidence of cardiac amyloidosis per cardiac MRI (2021), CKD stage IIIb, who presents today for hospital follow-up.  She was initially diagnosed with multiple myeloma which she underwent cardiac MRI in 2021 that showed no evidence of cardiac amyloidosis.  Normal LV size and function with EF 56%.  Echocardiogram in 2022 showed normal LVEF, and no valvular abnormalities.  She was last seen in clinic 04/26/2022 by Dr. Ancil Boozer.  At that time she continued to have complaints of shortness of breath at baseline with no recent worsening and had recently been started on Ozempic and had significant weight loss.  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  There were no changes made to her medication and no further testing that was ordered at that time.  She presented to [facility] emergency department on 09/03/2022 with concerns for sepsis due to pneumonia.  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  She required the use of pressors that were able to be weaned on 7/29, remained stable on ceftriaxone and azithromycin.  She was followed by surgery, urology, and GYN who recommended delayed procedures while on stable.There was a left hydroureteronephrosis related to adhesions for which IR was consulted and placed a percutaneous nephrostomy tube on 8/2 after Lovenox washout and transfusion were completed.  Serum creatinine peaked at 3.34 from obstructive uropathy.  Prior to discharge she had returned near  baseline at 1.81.  She had converted back into sinus rhythm with the use of amiodarone and was continued on oral amiodarone and apixaban for stroke prophylaxis.  She was considered stable for discharge and was discharged on 09/10/22.  She returns to clinic today accompanied by 2 family members.  She states that she is feeling a vast improvement since her hospitalization.  She denies any shortness of breath, chest pain, but does endorse increased swelling to bilateral lower extremities.  She states that her nephrologist had just recently increased her diuretic and she has been advised that that swelling should be resolved within 5 days.  She has noted to be in atrial flutter during hospitalization was treated with amiodarone and apixaban.  She has no longer taking the amiodarone and there was questions whether she is taking the apixaban once or twice daily on return today.  She denies any bleeding any noted blood in her stool and urine she continues to have a nephrostomy tube without bleeding noted from that area as well.  ROS: A full review of systems has been reviewed and considered negative with exception of what is been listed in the HPI  Studies Reviewed: Marland Kitchen   EKG Interpretation Date/Time:  Tuesday September 26 2022 14:42:33 EDT Ventricular Rate:  78 PR Interval:  238 QRS Duration:  90 QT Interval:  406 QTC Calculation: 462 R Axis:   92  Text Interpretation: Sinus rhythm with 1st degree A-V block Rightward axis When compared with ECG of 05-Sep-2022 03:23, Confirmed by Charlsie Quest ([id number]) on 09/26/2022 2:44:17 PM   TTE 09/06/22 1. Left  ventricular ejection fraction, by estimation, is 50 to 55%. The  left ventricle has low normal function. The left ventricle has no regional  wall motion abnormalities. Left ventricular diastolic parameters are  consistent with Grade II diastolic  dysfunction (pseudonormalization). Elevated left atrial pressure.   2. Right ventricular systolic function is normal.  The right ventricular  size is normal. There is normal pulmonary artery systolic pressure.   3. Left atrial size was mildly dilated.   4. The mitral valve is abnormal. Mild mitral valve regurgitation. No  evidence of mitral stenosis.   5. The aortic valve was not well visualized. Aortic valve regurgitation  is not visualized. No aortic stenosis is present.   6. Aortic dilatation noted. There is mild dilatation of the ascending  aorta, measuring 37 mm.   7. The inferior vena cava is normal in size with greater than 50%  respiratory variability, suggesting right atrial pressure of 3 mmHg.  Risk Assessment/Calculations:    CHA2DS2-VASc Score = 3   This indicates a 3.2% annual risk of stroke. The patient's score is based upon: CHF History: 0 HTN History: 1 Diabetes History: 0 Stroke History: 0 Vascular Disease History: 0 Age Score: 1 Gender Score: 1            Physical Exam:   VS:  BP 112/64   Pulse 76   Ht 5\' 2"  (1.575 m)   Wt 281 lb 12.8 oz (127.8 kg)   SpO2 95%   BMI 51.54 kg/m    Wt Readings from Last 3 Encounters:  09/26/22 281 lb 12.8 oz (127.8 kg)  09/08/22 (!) 313 lb 15 oz (142.4 kg)  08/16/22 296 lb 3.2 oz (134.4 kg)    GEN: Well nourished, well developed in no acute distress NECK: Unable to assess JVD due to body habitus; No carotid bruits CARDIAC: RRR, no murmurs, rubs, gallops RESPIRATORY:  Clear to auscultation without rales, wheezing or rhonchi  ABDOMEN: Soft, non-tender, obese, non-distended EXTREMITIES:  1+ edema to BLE; No deformity   ASSESSMENT AND PLAN: .   New onset atrial flutter during recent hospitalization likely triggered from sepsis, pneumonia, and outlet obstruction at the kidney.  EKG today reveals sinus rhythm with no ischemic changes noted.  Patient previously been on amiodarone and discharged from the hospital with de-escalating doses.  Unfortunately after day 12 she had no longer taking any more of the amiodarone and has been off of it since  this time.  At this time she will remain off of the amiodarone but she is continued on apixaban 5 mg twice daily for CHA2DS2-VASc score of at least 3 for stroke prophylaxis.  There is been reiterated to the patient his medication is only 12 hours protective so she needs to take it at minimum twice daily as prescribed.  Echocardiogram completed in the hospital revealed LVEF 50-55%, mild LAE, G2 DD.  Primary hypertension with a blood pressure of 112/64.  Blood pressure remained stable.  She is continued on furosemide, metolazone.  Encouraged to continue to monitor pressures at home as well taking her blood pressures 1 to 2 hours post medication administration.  Obstructive sleep apnea compliant with CPAP.  Multiple myeloma in remission with cardiac MRI that showed no evidence of cardiac amyloidosis.  She continues to be followed by oncology.  CKD stage IIIb with the increase in diuretics made by nephrology for her lower extremity swelling.  Diuretics will continue to be managed by nephrology.  Also suffered from obstructive uropathy and had urethral  stenting with a drainage bag placed by IR.  She has follow-up with urology already scheduled.  Requesting labs that were recently done by nephrology.  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  She has upcoming follow-up with surgery.  She did finish a course of antibiotics.  Morbid obesity with a BMI of 51.54.  She is continued on Ozempic.  Due to her body habitus is difficult to assess fluid status.       Dispo: Patient to return to clinic to see MD/APP in 3 months or sooner if needed  Signed, Mykhia Danish, NP

## 2022-09-28 ENCOUNTER — Inpatient Hospital Stay: Payer: 59 | Attending: Hematology

## 2022-09-28 DIAGNOSIS — G629 Polyneuropathy, unspecified: Secondary | ICD-10-CM | POA: Insufficient documentation

## 2022-09-28 DIAGNOSIS — N189 Chronic kidney disease, unspecified: Secondary | ICD-10-CM | POA: Insufficient documentation

## 2022-09-28 DIAGNOSIS — D631 Anemia in chronic kidney disease: Secondary | ICD-10-CM | POA: Diagnosis not present

## 2022-09-28 DIAGNOSIS — D508 Other iron deficiency anemias: Secondary | ICD-10-CM

## 2022-09-28 DIAGNOSIS — C9000 Multiple myeloma not having achieved remission: Secondary | ICD-10-CM

## 2022-09-28 DIAGNOSIS — C9002 Multiple myeloma in relapse: Secondary | ICD-10-CM | POA: Diagnosis present

## 2022-09-28 DIAGNOSIS — Z79899 Other long term (current) drug therapy: Secondary | ICD-10-CM | POA: Diagnosis not present

## 2022-09-28 DIAGNOSIS — Z95828 Presence of other vascular implants and grafts: Secondary | ICD-10-CM

## 2022-09-28 LAB — IRON AND TIBC
Iron: 89 ug/dL (ref 28–170)
Saturation Ratios: 32 % — ABNORMAL HIGH (ref 10.4–31.8)
TIBC: 277 ug/dL (ref 250–450)
UIBC: 188 ug/dL

## 2022-09-28 LAB — CBC WITH DIFFERENTIAL/PLATELET
Abs Immature Granulocytes: 0.16 10*3/uL — ABNORMAL HIGH (ref 0.00–0.07)
Basophils Absolute: 0 10*3/uL (ref 0.0–0.1)
Basophils Relative: 0 %
Eosinophils Absolute: 0.1 10*3/uL (ref 0.0–0.5)
Eosinophils Relative: 1 %
HCT: 32.6 % — ABNORMAL LOW (ref 36.0–46.0)
Hemoglobin: 10.6 g/dL — ABNORMAL LOW (ref 12.0–15.0)
Immature Granulocytes: 3 %
Lymphocytes Relative: 18 %
Lymphs Abs: 1.1 10*3/uL (ref 0.7–4.0)
MCH: 29.1 pg (ref 26.0–34.0)
MCHC: 32.5 g/dL (ref 30.0–36.0)
MCV: 89.6 fL (ref 80.0–100.0)
Monocytes Absolute: 1.5 10*3/uL — ABNORMAL HIGH (ref 0.1–1.0)
Monocytes Relative: 24 %
Neutro Abs: 3.2 10*3/uL (ref 1.7–7.7)
Neutrophils Relative %: 54 %
Platelets: 169 10*3/uL (ref 150–400)
RBC: 3.64 MIL/uL — ABNORMAL LOW (ref 3.87–5.11)
RDW: 14.6 % (ref 11.5–15.5)
WBC: 6 10*3/uL (ref 4.0–10.5)
nRBC: 0 % (ref 0.0–0.2)

## 2022-09-28 LAB — COMPREHENSIVE METABOLIC PANEL
ALT: 10 U/L (ref 0–44)
AST: 14 U/L — ABNORMAL LOW (ref 15–41)
Albumin: 3.9 g/dL (ref 3.5–5.0)
Alkaline Phosphatase: 50 U/L (ref 38–126)
Anion gap: 12 (ref 5–15)
BUN: 65 mg/dL — ABNORMAL HIGH (ref 8–23)
CO2: 31 mmol/L (ref 22–32)
Calcium: 9.4 mg/dL (ref 8.9–10.3)
Chloride: 91 mmol/L — ABNORMAL LOW (ref 98–111)
Creatinine, Ser: 2.25 mg/dL — ABNORMAL HIGH (ref 0.44–1.00)
GFR, Estimated: 24 mL/min — ABNORMAL LOW (ref >60)
Glucose, Bld: 93 mg/dL (ref 70–99)
Potassium: 3.1 mmol/L — ABNORMAL LOW (ref 3.5–5.1)
Sodium: 134 mmol/L — ABNORMAL LOW (ref 135–145)
Total Bilirubin: 0.7 mg/dL (ref 0.3–1.2)
Total Protein: 7 g/dL (ref 6.5–8.1)

## 2022-09-28 LAB — FERRITIN: Ferritin: 404 ng/mL — ABNORMAL HIGH (ref 11–307)

## 2022-09-28 LAB — TSH: TSH: 1.725 u[IU]/mL (ref 0.350–4.500)

## 2022-09-28 LAB — MAGNESIUM: Magnesium: 2.4 mg/dL (ref 1.7–2.4)

## 2022-09-28 MED ORDER — SODIUM CHLORIDE 0.9% FLUSH
10.0000 mL | INTRAVENOUS | Status: DC | PRN
  Administered 2022-09-28: 10 mL

## 2022-09-28 MED ORDER — HEPARIN SOD (PORK) LOCK FLUSH 100 UNIT/ML IV SOLN
500.0000 [IU] | Freq: Once | INTRAVENOUS | Status: AC
  Administered 2022-09-28: 500 [IU] via INTRAVENOUS

## 2022-09-29 LAB — KAPPA/LAMBDA LIGHT CHAINS
Kappa free light chain: 37.3 mg/L — ABNORMAL HIGH (ref 3.3–19.4)
Kappa, lambda light chain ratio: 1.64 (ref 0.26–1.65)
Lambda free light chains: 22.7 mg/L (ref 5.7–26.3)

## 2022-10-01 LAB — PROTEIN ELECTROPHORESIS, SERUM
A/G Ratio: 1.3 (ref 0.7–1.7)
Albumin ELP: 3.7 g/dL (ref 2.9–4.4)
Alpha-1-Globulin: 0.3 g/dL (ref 0.0–0.4)
Alpha-2-Globulin: 0.9 g/dL (ref 0.4–1.0)
Beta Globulin: 1 g/dL (ref 0.7–1.3)
Gamma Globulin: 0.8 g/dL (ref 0.4–1.8)
Globulin, Total: 2.9 g/dL (ref 2.2–3.9)
M-Spike, %: 0.3 g/dL — ABNORMAL HIGH
Total Protein ELP: 6.6 g/dL (ref 6.0–8.5)

## 2022-10-03 ENCOUNTER — Ambulatory Visit (INDEPENDENT_AMBULATORY_CARE_PROVIDER_SITE_OTHER): Payer: 59 | Admitting: General Surgery

## 2022-10-03 ENCOUNTER — Encounter: Payer: Self-pay | Admitting: General Surgery

## 2022-10-03 VITALS — BP 125/76 | HR 70 | Temp 98.0°F | Resp 12 | Ht 62.0 in | Wt 281.0 lb

## 2022-10-03 DIAGNOSIS — N823 Fistula of vagina to large intestine: Secondary | ICD-10-CM | POA: Diagnosis not present

## 2022-10-03 NOTE — Patient Instructions (Signed)
Will discuss case with Urology and Dr. Ellin Saba, Dr. Marletta Lor (GI) and figure out best plan. Will plan to see you back in a month.  Call with issues or changes. Worsening pain in the lower abdomen, worsening drainage from your vagina, etc.

## 2022-10-03 NOTE — Progress Notes (Unsigned)
[facility] Note   HPI:  67 y.o. Female presents to clinic for follow-up evaluation of for known colovaginal fistula and hydronephrosis on the left with nephrostomy tube placement. She was admitted for pneumonia and found to have these other things in a workup done for complaints of abdominal pain and vaginal discharge.  She had the discharge for months leading up to this and says she really ahs not had much recently. She says she has no pain. She is able to eat and has regular Bms. She is getting stronger after her admission. She is following up with Urology next week and sees Dr. Ellin Saba this week for her multiple myeloma follow up.   As we had discussed in the hospital the colovaginal fistula is annoying and has likely caused inflammation that led to the nephrostomy tube but we need to go slowly and figure out her best plan.  She is here today with several family members.   Review of Systems:  Good intake/ appetite Regular Bms Some minor discharge from vagina  All other review of systems: otherwise negative   Vital Signs:  BP 125/76   Pulse 70   Temp 98 F (36.7 C) (Oral)   Resp 12   Ht 5\' 2"  (1.575 m)   Wt 281 lb (127.5 kg)   SpO2 94%   BMI 51.40 kg/m    Physical Exam:  Physical Exam Constitutional:      Appearance: She is obese.  HENT:     Head: Normocephalic.  Eyes:     Extraocular Movements: Extraocular movements intact.  Cardiovascular:     Rate and Rhythm: Normal rate.  Pulmonary:     Effort: Pulmonary effort is normal.  Abdominal:     General: There is no distension.     Palpations: Abdomen is soft.     Tenderness: There is no abdominal tenderness.  Neurological:     General: No focal deficit present.  Psychiatric:        Mood and Affect: Mood normal.     Assessment:  67 y.o. yo Female with a colovaginal fistula causing some inflammation and leading to left ureter enlargement and hydronephrosis and nephrostomy tube. She has minimal  complaints of the discharge. She is otherwise without pain or issue. She is recovering from her PNA and is following up with all of her providers.   Discussed with her and her family that the only way to resolve the fistula is resection and ostomy and some women do not want this as the drainage is minimal or not bothersome to them but that for her the situation could end up and be dictated by the left ureter and the compression. Discussed that we have time to see what her best plan is and that I need to discuss it with Urology, Dr. Ellin Saba, and also Dr. Marletta Lor as her last colonoscopy was in 2021 but if we decide to not pursue surgical management we need to make sure that this is from diverticular disease and not a cancer.    If we have to resect anyway then a repeat scope is likely not going to add or change anything at this time.   Plan:  Will discuss case with Urology and Dr. Ellin Saba, Dr. Marletta Lor (GI) and figure out best plan. Will plan to see you back in a month.  Call with issues or changes. Worsening pain in the lower abdomen, worsening drainage from your vagina, etc.   Future Appointments  Date Time Provider Department Center  10/05/2022 12:00 PM Doreatha Massed, MD [facility] None  10/16/2022  9:00 AM Donnita Falls, FNP [facility] None  11/02/2022  2:45 PM Lucretia Roers, MD [facility] None  01/02/2023 10:20 AM Mallipeddi, Orion Modest, MD [facility] [facility]      Algis Greenhouse, MD [facility] [address] [place], [place] [address] [phone] (office)

## 2022-10-04 LAB — IMMUNOFIXATION ELECTROPHORESIS
IgA: 118 mg/dL (ref 87–352)
IgG (Immunoglobin G), Serum: 786 mg/dL (ref 586–1602)
IgM (Immunoglobulin M), Srm: 62 mg/dL (ref 26–217)
Total Protein ELP: 6.6 g/dL (ref 6.0–8.5)

## 2022-10-04 NOTE — Progress Notes (Signed)
[facility] [address] [place] [address]    Clinic Day:  10/24/22   Referring physician: [facility]  Patient Care Team: [facility] as PCP - General Mallipeddi, Orion Modest, MD as PCP - Cardiology (Cardiology) Doreatha Massed, MD as Consulting Physician (Medical Oncology) Nyoka Cowden, MD as Consulting Physician (Pulmonary Disease)   ASSESSMENT & PLAN:   Assessment: 1.  Relapsed IgG kappa multiple myeloma with high risk features: -5 cycles of carfilzomib, pomalidomide and dexamethasone from 03/05/2019 through 06/25/2019. -Myeloma panel on 07/09/2019 shows M spike 0.3 g, slightly up from 0.2 g previously.  However free light chain ratio has 1.37 and improved.  Kappa light chains are 24.8. - Carfilzomib discontinued after 08/06/2019 secondary to fluid retention. - She is currently on Pomalyst 4 mg 3 weeks on 1 week off. - PET scan on 03/22/2020 with no evidence of malignancy. - Bone marrow biopsy on 03/19/2020 showed approximately 5% plasma cells.  Chromosome analysis and FISH panel were normal.   2.  Shortness of breath on exertion: -2D echo on 02/09/2019 shows EF 60-65%.  No LVH. -Chest CT PE protocol on 02/24/2019 was negative. -Cardiac MRI on 03/07/2019 shows LVEF 56% with no amyloid.  Troponin T and proBNP were negative. -PFTs show low expiratory reserve volume consistent with body habitus.  Moderate diffusion defect which corrects to normal.  FVC, FEV1, FEV1/FVC ratio are within normal limits.  FVC is reduced relative to SVC indicates air-trapping.  No significant response after bronchodilators.  Reduced diffusion capacity indicates moderate loss of functional alveolar capillary space. -She was evaluated by Dr. Sherene Sires. -2D echocardiogram on 08/27/2019 shows LVEF 60 to 65%.  No LVH.   3.  Myeloma bone disease: -Bone density on 07/04/2019 shows T score 1.8.    Plan: 1.  Relapsed IgG kappa multiple myeloma with high  risk features: - She is tolerating pomalidomide and dexamethasone reasonably well. - Reviewed myeloma labs from 09/29/2022.  M spike is stable at 0.3 g.  Free light chain ratio is normal at 1.64 and kappa light chains 37.3.  Immunofixation shows IgG kappa. - Recommend continuing pomalidomide 4 mg 3 weeks on/1 week off and dexamethasone 20 mg weekly. - RTC 12 weeks for follow-up with repeat multiple myeloma labs.   2.  Normocytic anemia: - Normocytic anemia from CKD and functional iron deficiency. - Ferritin was 404 and percent saturation 32.  Hemoglobin is 10.6.   3.  Thromboprophylaxis: - Continue aspirin 81 mg daily.   4.  Neuropathy: - At last visit I started her on Lyrica 75 mg once daily. - She reports her symptoms are not well-controlled. - Will increase Lyrica 75 mg to twice daily.   5.  Lower extremity edema: - Continue Lasix 40 mg twice daily and metolazone 3 times weekly.   6.  Myeloma bone disease: - Zometa held after last infusion on 01/07/2020 due to worsening renal function.   7.  Hypokalemia: - Continue potassium 2 tablets daily at home.  Potassium was 3.1.   8.  Bilateral knee pains: - Continue Percocet twice daily as needed.    No orders of the defined types were placed in this encounter.     Alben Deeds Teague,acting as a Neurosurgeon for Doreatha Massed, MD.,have documented all relevant documentation on the behalf of Doreatha Massed, MD,as directed by  Doreatha Massed, MD while in the presence of Doreatha Massed, MD.  I, Doreatha Massed MD, have reviewed the  above documentation for accuracy and completeness, and I agree with the above.    Doreatha Massed, MD   9/17/20246:34 PM  CHIEF COMPLAINT:   Diagnosis: multiple myeloma    Cancer Staging  Multiple myeloma [facility]) Staging form: Multiple Myeloma, AJCC 6th Edition - Clinical stage from 10/26/2014: Stage IIA - Signed by Ellouise Newer, PA-C on 10/26/2014 - Pathologic: No stage  assigned - Unsigned    Prior Therapy: 1. Bortezomib x 8 cycles from 10/05/2014 to 04/06/2016. 2. Carfilzomib x 6 cycles from 03/05/2019 to 08/06/2019.  Current Therapy:  Pomalyst 4 mg 3/4 weeks    HISTORY OF PRESENT ILLNESS:   Oncology History  Multiple myeloma (HCC)  08/07/2014 Imaging   Bone Survey- No lytic lesions are noted in the visualized skeleton.   09/03/2014 Bone Marrow Biopsy   NORMOCELLULAR BONE MARROW FOR AGE WITH PLASMA CELL NEOPLASM.  The plasma cell component is increased in the marrow representing an estimated 18% of all cells. Cytogenetics with 13q-, 17p- (high risk disease)   09/03/2014 Pathology Results   Cytogenetics with 13q-, 17p- (high risk disease)   09/23/2014 Initial Diagnosis   Multiple myeloma   09/30/2014 PET scan   No abnormal hypermetabolism in the neck, chest, abdomen or pelvis.   10/05/2014 - 03/22/2015 Chemotherapy   RVD   10/28/2014 Treatment Plan Change   Issues related to getting Revlimid in a timely fashion, therefore, she received her Revlimid on 9/21 resulting in a 12 day cycle this time instead of a 14 day cycle   11/02/2014 Imaging   CTA chest- No evidence for a large or central pulmonary embolism as described.  8 mm density along the right minor fissure could represent focal pleural thickening but indeterminate. If the patient is at high risk for bronchogenic carcinoma, follow-up c   11/23/2014 Miscellaneous   Zometa 4 mg IV monthly   04/07/2015 Bone Marrow Biopsy   Normocellular marrow with 2-4% clonal plasma cells by immunohistochemistry. FISH and cytogenetics were normal [facility])     04/2015 Miscellaneous   PRETRANSPLANT EVALUATION:  Pulmonary function tests: FEV1 100.3% / DLCO 97.9%  Echocardiogram: Normal LV function with EF 60-65%    04/27/2015 Procedure   Stem cell mobilization with filgrastim and Mozobil [facility])   05/06/2015 Miscellaneous   BMT conditioning regimen with high-dose Melphalan given [facility], Zigmund Daniel); Day  -1   05/07/2015 Bone Marrow Transplant   Outpatient autologous stem cell transplant [facility], Zigmund Daniel); Day 0   05/18/2015 Miscellaneous   WBC engraftment;  did not require platelet transfusion during her transplant process. Lowest platelet count 28,000    05/20/2015 Procedure   Tunneled catheter removed [facility])   09/08/2015 -  Chemotherapy   Velcade every 2 weeks   12/15/2015 Miscellaneous   Zometa re-instituted.    12/23/2015 Imaging   Bone density- BMD as determined from Forearm Radius 33% is 0.799 g/cm2 with a T-Score of 1.2. This patient is considered normal according to World Health Organization [facility]) criteria.   04/17/2016 Miscellaneous   Started maintenance with Ninlaro.   05/04/2016 Treatment Plan Change   Zometa switched to Xgeva injection monthly given difficult IV access.    03/05/2019 - 08/06/2019 Chemotherapy   The patient had carfilzomib (KYPROLIS) 40 mg in dextrose 5 % 50 mL chemo infusion, 18 mg/m2 = 44 mg, Intravenous,  Once, 6 of 6 cycles Dose modification: 56 mg/m2 (original dose 56 mg/m2, Cycle 1, Reason: Provider Judgment) Administration: 40 mg (03/05/2019), 120 mg (03/12/2019), 120 mg (03/20/2019), 120 mg (04/02/2019),  120 mg (04/09/2019), 120 mg (04/16/2019), 120 mg (04/30/2019), 120 mg (05/08/2019), 120 mg (05/14/2019), 120 mg (05/28/2019), 120 mg (06/04/2019), 120 mg (06/11/2019), 120 mg (06/25/2019), 120 mg (07/02/2019), 120 mg (07/09/2019), 120 mg (07/23/2019), 120 mg (07/30/2019), 120 mg (08/06/2019)  for chemotherapy treatment.       INTERVAL HISTORY:   Kelli Hurley is a 67 y.o. female presenting to clinic today for follow up of multiple myeloma. She was last seen by me on 06/28/22.  Since her last visit, she was admitted to the ED on 09/03/22 for sepsis. 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 She was positive  for Pneumococcal urin antigen and was ut on 7 days ceftriaxone. She had a tube placed in the kidney. 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   Today, she states that she is doing well overall. Her appetite level is at 100%. Her energy level is at 50%. She is accompanied by her family.   She has discontinued ASA, as she was prescribed Eliquis. She is taking all other medications as prescribed. She denies any history of colonitis. Her last colonoscopy was in 2021. She would like to increase the dosage of Lyrica, as 1 pill is improving neuropathy, but not completely and needs a refill. She has received her pain medication.  PAST MEDICAL HISTORY:   Past Medical History: Past Medical History:  Diagnosis Date   Anemia    Arthritis    Cancer (HCC)    Claustrophobia 10/05/2014   Hypertension    Leukopenia 08/03/2014   Normocytic hypochromic anemia 08/03/2014   Renal disorder    stage 3     Surgical History: Past Surgical History:  Procedure Laterality Date   ABDOMINAL HYSTERECTOMY     CARDIAC SURGERY     59 months old. States she had a leaky valve.    COLONOSCOPY  03/26/2009   Dr. Darrick Penna; normal colon, small internal hemorrhoids.  Recommended repeat colonoscopy in 10 years.   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"   PORTACATH PLACEMENT Left 02/21/2019   Procedure: INSERTION PORT-A-CATH;  Surgeon: Lucretia Roers, MD;  Location: [facility] ORS;  Service: General;  Laterality: Left;    Social History: Social History   Socioeconomic History   Marital status: Single    Spouse name: Not on file   Number of children: Not on file   Years of education: Not on  file   Highest education level: Not on file  Occupational History   Not on file  Tobacco Use   Smoking status: Never   Smokeless tobacco: Never  Vaping Use   Vaping status: Never Used  Substance and Sexual Activity   Alcohol use: No   Drug use: No   Sexual activity: Not Currently    Birth control/protection: Surgical    Comment: divorced- 2 daughters; hyst  Other Topics Concern   Not on file  Social History Narrative   Not on file   Social Determinants of Health   Financial  Resource Strain: Medium Risk (08/16/2022)   Overall Financial Resource Strain (CARDIA)    Difficulty of Paying Living Expenses: Somewhat hard  Food Insecurity: No Food Insecurity (09/04/2022)   Hunger Vital Sign    Worried About Running Out of Food in the Last Year: Never true    Ran Out of Food in the Last Year: Never true  Transportation Needs: No Transportation Needs (09/04/2022)   PRAPARE - Administrator, Civil Service (Medical): No    Lack of Transportation (Non-Medical): No  Physical Activity: Insufficiently Active (08/16/2022)   Exercise Vital Sign    Days of Exercise per Week: 1 day    Minutes of Exercise per Session: 10 min  Stress: No Stress Concern Present (08/16/2022)   [employer] of Occupational Health - Occupational Stress Questionnaire    Feeling of Stress : Not at all  Social Connections: Moderately Isolated (08/16/2022)   Social Connection and Isolation Panel [NHANES]    Frequency of Communication with Friends and Family: More than three times a week    Frequency of Social Gatherings with Friends and Family: Once a week    Attends Religious Services: More than 4 times per year    Active Member of [employer] or Organizations: No    Attends Banker Meetings: Never    Marital Status: Divorced  Catering manager Violence: Not At Risk (09/04/2022)   Humiliation, Afraid, Rape, and Kick questionnaire    Fear of Current or Ex-Partner: No    Emotionally Abused: No     Physically Abused: No    Sexually Abused: No    Family History: Family History  Problem Relation Age of Onset   Cancer Maternal Grandmother    Cancer Father    Hypertension Father    Cancer Mother    Hypertension Mother    Hypertension Brother    Prostate cancer Brother    Hypertension Brother    Hypertension Brother    Hypertension Sister    Hypertension Sister    Hypertension Sister    Hypertension Sister    Colon cancer Neg Hx    Colon polyps Neg Hx     Current Medications:  Current Outpatient Medications:    acetaminophen (TYLENOL) 650 MG CR tablet, Take 650 mg by mouth every 8 (eight) hours as needed for pain., Disp: , Rfl:    albuterol (VENTOLIN HFA) 108 (90 Base) MCG/ACT inhaler, Inhale 2 puffs into the lungs every 6 (six) hours as needed for wheezing or shortness of breath., Disp: 8 g, Rfl: 3   apixaban (ELIQUIS) 5 MG TABS tablet, Take 1 tablet (5 mg total) by mouth 2 (two) times daily., Disp: 60 tablet, Rfl: 3   calcitRIOL (ROCALTROL) 0.25 MCG capsule, Take 0.25 mcg by mouth daily., Disp: , Rfl:    dexamethasone (DECADRON) 4 MG tablet, TAKE FIVE TABLETS BY MOUTH ONCE A WEEK, Disp: 30 tablet, Rfl: 6   furosemide (LASIX) 20 MG tablet, TAKE 2 TABLETS BY MOUTH DAILY IN THE MORNING AND TAKE 2 TABLETS AT BEDTIME (Patient taking differently: Take 20 mg by mouth as directed. 2 tablet in the morning and 2 tablets at bedtime on Mon., Wed., and Friday. 1 tablet in the morning and 1 tablet at bedtime on Sun., Tues., Thurs., and Sat.), Disp: 120 tablet, Rfl: 8   Infant Care Products (DERMACLOUD) OINT, Apply to affected area as needed Externally for 30 days, Disp: , Rfl:    magnesium oxide (MAG-OX) 400 (240 Mg) MG tablet, TAKE 1 TABLET  BY MOUTH TWICE DAILY, Disp: 60 tablet, Rfl: 6   metolazone (ZAROXOLYN) 2.5 MG tablet, Take 1 tablet (2.5 mg total) by mouth daily. (Patient taking differently: Take 2.5 mg by mouth 3 (three) times a week. Mon., Wed., and Friday), Disp: 30 tablet, Rfl: 6    Multiple Vitamin (TAB-A-VITE) TABS, TAKE 1 TABLET BY MOUTH ONCE DAILY. (Patient taking differently: Take 1 tablet by mouth daily.), Disp: 30 tablet, Rfl: 11   pomalidomide (POMALYST) 4 MG capsule, Take 1 capsule (4 mg total) by mouth daily. 21 days on, 7 days off, Disp: 21 capsule, Rfl: 0   potassium chloride SA (KLOR-CON M) 20 MEQ tablet, TAKE 1 TABLET BY MOUTH THREE TIMES DAILY (Patient taking differently: Take 20 mEq by mouth daily.), Disp: 90 tablet, Rfl: 10   rosuvastatin (CRESTOR) 10 MG tablet, Take 1 tablet (10 mg total) by mouth daily., Disp: 90 tablet, Rfl: 1   Semaglutide, 2 MG/DOSE, (OZEMPIC, 2 MG/DOSE,) 8 MG/3ML SOPN, Inject 2 mg into the skin once a week., Disp: 3 mL, Rfl: 11   trolamine salicylate (ASPERCREME) 10 % cream, Apply 1 application topically 2 (two) times daily as needed for muscle pain., Disp: , Rfl:    VITAMIN D PO, Take by mouth., Disp: , Rfl:    lidocaine-prilocaine (EMLA) cream, APPLY 1 QUARTER SIZED AMOUNT 1 HOUR PRIOR TO TREATMENT (Patient not taking: Reported on 10/05/2022), Disp: 30 g, Rfl: 10   oxyCODONE-acetaminophen (PERCOCET/ROXICET) 5-325 MG tablet, Take 1-2 tablets by mouth every 12 (twelve) hours as needed for up to 15 days for severe pain. TAKE 1 OR 2 TABLETS BY MOUTH EVERY TWELVE HOURS AS NEEDED for severe pain, Disp: 60 tablet, Rfl: 0   pregabalin (LYRICA) 75 MG capsule, Take 1 capsule (75 mg total) by mouth 2 (two) times daily., Disp: 60 capsule, Rfl: 3 No current facility-administered medications for this visit.  Facility-Administered Medications Ordered in Other Visits:    heparin lock flush 100 unit/mL, 500 Units, Intravenous, Once, Penland, Carollee Herter K, MD   sodium chloride 0.9 % injection 10 mL, 10 mL, Intravenous, Once, Penland, Novella Olive, MD   Allergies: Allergies  Allergen Reactions   Diclofenac Swelling    Per pt, facial swelling  Other Reaction(s): Other (See Comments)  Per pt, facial swelling, Per pt, facial swelling    REVIEW OF  SYSTEMS:   Review of Systems  Constitutional:  Negative for chills, fatigue and fever.  HENT:   Negative for lump/mass, mouth sores, nosebleeds, sore throat and trouble swallowing.   Eyes:  Negative for eye problems.  Respiratory:  Positive for shortness of breath (with exertion). Negative for cough.   Cardiovascular:  Negative for chest pain, leg swelling and palpitations.  Gastrointestinal:  Positive for constipation. Negative for abdominal pain, diarrhea, nausea and vomiting.  Genitourinary:  Negative for bladder incontinence, difficulty urinating, dysuria, frequency, hematuria and nocturia.   Musculoskeletal:  Positive for arthralgias (in knees, 7/10 severity). Negative for back pain, flank pain, myalgias and neck pain.  Skin:  Negative for itching and rash.  Neurological:  Positive for numbness (in feet and right hand). Negative for dizziness and headaches.  Hematological:  Does not bruise/bleed easily.  Psychiatric/Behavioral:  Negative for depression, sleep disturbance and suicidal ideas. The patient is not nervous/anxious.   All other systems reviewed and are negative.    VITALS:   Blood pressure 131/76, pulse 63, temperature (!) 97.5 F (36.4 C), temperature source Oral, resp. rate 16, weight 283 lb 6.4 oz (128.5 kg), SpO2 98%.  Wt Readings from Last 3 Encounters:  10/05/22 283 lb 6.4 oz (128.5 kg)  10/03/22 281 lb (127.5 kg)  09/26/22 281 lb 12.8 oz (127.8 kg)    Body mass index is 51.83 kg/m.  Performance status (ECOG): 1 - Symptomatic but completely ambulatory  PHYSICAL EXAM:   Physical Exam Vitals and nursing note reviewed. Exam conducted with a chaperone present.  Constitutional:      Appearance: Normal appearance.  Cardiovascular:     Rate and Rhythm: Normal rate and regular rhythm.     Pulses: Normal pulses.     Heart sounds: Normal heart sounds.  Pulmonary:     Effort: Pulmonary effort is normal.     Breath sounds: Normal breath sounds.  Abdominal:      Palpations: Abdomen is soft. There is no hepatomegaly, splenomegaly or mass.     Tenderness: There is no abdominal tenderness.  Musculoskeletal:     Right lower leg: No edema.     Left lower leg: No edema.  Lymphadenopathy:     Cervical: No cervical adenopathy.     Right cervical: No superficial, deep or posterior cervical adenopathy.    Left cervical: No superficial, deep or posterior cervical adenopathy.     Upper Body:     Right upper body: No supraclavicular or axillary adenopathy.     Left upper body: No supraclavicular or axillary adenopathy.  Neurological:     General: No focal deficit present.     Mental Status: She is alert and oriented to person, place, and time.  Psychiatric:        Mood and Affect: Mood normal.        Behavior: Behavior normal.     LABS:      Latest Ref Rng & Units 09/28/2022   11:34 AM 09/10/2022    5:35 AM 09/09/2022    3:37 AM  CBC  WBC 4.0 - 10.5 K/uL 6.0  4.7  5.6   Hemoglobin 12.0 - 15.0 g/dL 59.5  8.7  9.0   Hematocrit 36.0 - 46.0 % 32.6  26.9  28.2   Platelets 150 - 400 K/uL 169  237  207       Latest Ref Rng & Units 09/28/2022   11:34 AM 09/10/2022    5:35 AM 09/09/2022    3:37 AM  CMP  Glucose 70 - 99 mg/dL 93  86  96   BUN 8 - 23 mg/dL 65  15  15   Creatinine 0.44 - 1.00 mg/dL 6.38  7.56  4.33   Sodium 135 - 145 mmol/L 134  136  135   Potassium 3.5 - 5.1 mmol/L 3.1  3.6  3.7   Chloride 98 - 111 mmol/L 91  107  108   CO2 22 - 32 mmol/L 31  21  22    Calcium 8.9 - 10.3 mg/dL 9.4  8.4  8.4   Total Protein 6.5 - 8.1 g/dL 7.0     Total Bilirubin 0.3 - 1.2 mg/dL 0.7     Alkaline Phos 38 - 126 U/L 50     AST 15 - 41 U/L 14     ALT 0 - 44 U/L 10        No results found for: "CEA1", "CEA" / No results found for: "CEA1", "CEA" No results found for: "PSA1" No results found for: "IRJ188" No results found for: "CAN125"  Lab Results  Component Value Date   TOTALPROTELP 6.6 09/28/2022   TOTALPROTELP 6.6 09/28/2022  ALBUMINELP 3.7  09/28/2022   A1GS 0.3 09/28/2022   A2GS 0.9 09/28/2022   BETS 1.0 09/28/2022   GAMS 0.8 09/28/2022   MSPIKE 0.3 (H) 09/28/2022   SPEI Comment 09/28/2022   Lab Results  Component Value Date   TIBC 277 09/28/2022   TIBC 265 06/21/2022   TIBC 204 (L) 02/15/2022   FERRITIN 404 (H) 09/28/2022   FERRITIN 310 (H) 06/21/2022   FERRITIN 1,004 (H) 02/15/2022   IRONPCTSAT 32 (H) 09/28/2022   IRONPCTSAT 36 (H) 06/21/2022   IRONPCTSAT 50 (H) 02/15/2022   Lab Results  Component Value Date   LDH 136 03/22/2022   LDH 134 12/13/2021   LDH 168 09/14/2021     STUDIES:   No results found.

## 2022-10-05 ENCOUNTER — Other Ambulatory Visit: Payer: Self-pay | Admitting: *Deleted

## 2022-10-05 ENCOUNTER — Inpatient Hospital Stay (HOSPITAL_BASED_OUTPATIENT_CLINIC_OR_DEPARTMENT_OTHER): Payer: 59 | Admitting: Hematology

## 2022-10-05 VITALS — BP 131/76 | HR 63 | Temp 97.5°F | Resp 16 | Wt 283.4 lb

## 2022-10-05 DIAGNOSIS — C9000 Multiple myeloma not having achieved remission: Secondary | ICD-10-CM | POA: Diagnosis not present

## 2022-10-05 DIAGNOSIS — C9002 Multiple myeloma in relapse: Secondary | ICD-10-CM | POA: Diagnosis not present

## 2022-10-05 MED ORDER — PREGABALIN 75 MG PO CAPS: 75.0000 mg | ORAL_CAPSULE | Freq: Two times a day (BID) | ORAL | 3 refills | Status: DC

## 2022-10-05 NOTE — Progress Notes (Signed)
Patient is taking Pomalyst as prescribed.  She has not missed any doses and reports no side effects at this time.   

## 2022-10-05 NOTE — Patient Instructions (Addendum)
[facility] at [facility] Discharge Instructions   You were seen and examined today by Dr. Ellin Saba.  He reviewed the results of your lab work which are normal/stable.   You may increase the Lyrica to twice a day.   We will see you back in 3 months. We will repeat lab work one week prior.   Return as scheduled.    Thank you for choosing [facility] at [facility] to provide your oncology and hematology care.  To afford each patient quality time with our provider, please arrive at least 15 minutes before your scheduled appointment time.   If you have a lab appointment with the Cancer Center please come in thru the Main Entrance and check in at the main information desk.  You need to re-schedule your appointment should you arrive 10 or more minutes late.  We strive to give you quality time with our providers, and arriving late affects you and other patients whose appointments are after yours.  Also, if you no show three or more times for appointments you may be dismissed from the clinic at the providers discretion.     Again, thank you for choosing [facility].  Our hope is that these requests will decrease the amount of time that you wait before being seen by our physicians.       _____________________________________________________________  Should you have questions after your visit to [facility], please contact our office at [phone] and follow the prompts.  Our office hours are 8:00 a.m. and 4:30 p.m. Monday - Friday.  Please note that voicemails left after 4:00 p.m. may not be returned until the following business day.  We are closed weekends and major holidays.  You do have access to a nurse 24-7, just call the main number to the clinic [phone] and do not press any options, hold on the line and a nurse will answer the phone.    For prescription refill requests, have your pharmacy contact our office and  allow 72 hours.    Due to Covid, you will need to wear a mask upon entering the hospital. If you do not have a mask, a mask will be given to you at the Main Entrance upon arrival. For doctor visits, patients may have 1 support person age 64 or older with them. For treatment visits, patients can not have anyone with them due to social distancing guidelines and our immunocompromised population.

## 2022-10-11 ENCOUNTER — Other Ambulatory Visit: Payer: Self-pay

## 2022-10-11 DIAGNOSIS — G8929 Other chronic pain: Secondary | ICD-10-CM

## 2022-10-11 MED ORDER — OXYCODONE-ACETAMINOPHEN 5-325 MG PO TABS: 1.0000 | ORAL_TABLET | Freq: Two times a day (BID) | ORAL | 0 refills | Status: DC | PRN

## 2022-10-16 ENCOUNTER — Ambulatory Visit: Payer: 59 | Admitting: Urology

## 2022-10-23 ENCOUNTER — Telehealth: Payer: Self-pay | Admitting: Urology

## 2022-10-23 NOTE — Telephone Encounter (Signed)
Spoke with patient this morning regarding her call to the answering service this weekend, I let her know if she continues to have issues she would need to contact the provider who put the tube in, until we can get her in for a new patient appointment.

## 2022-10-24 ENCOUNTER — Encounter (HOSPITAL_COMMUNITY): Payer: Self-pay | Admitting: Hematology

## 2022-10-25 ENCOUNTER — Other Ambulatory Visit: Payer: Self-pay

## 2022-10-25 MED ORDER — POMALIDOMIDE 4 MG PO CAPS: 4.0000 mg | ORAL_CAPSULE | Freq: Every day | ORAL | 0 refills | Status: DC

## 2022-10-25 NOTE — Telephone Encounter (Signed)
Chart reviewed. Pomalyst refilled per last office note with Dr. Ellin Saba.

## 2022-10-26 ENCOUNTER — Other Ambulatory Visit: Payer: Self-pay | Admitting: Internal Medicine

## 2022-11-02 ENCOUNTER — Encounter: Payer: Self-pay | Admitting: General Surgery

## 2022-11-02 ENCOUNTER — Ambulatory Visit: Payer: 59 | Admitting: General Surgery

## 2022-11-02 VITALS — BP 121/77 | HR 73 | Temp 97.7°F | Resp 16 | Ht 62.0 in | Wt 283.0 lb

## 2022-11-02 DIAGNOSIS — N823 Fistula of vagina to large intestine: Secondary | ICD-10-CM

## 2022-11-02 NOTE — Patient Instructions (Addendum)
Will follow up again with Dr. Marletta Lor and Dr. Lucas Mallow. Retta Diones (Urology) and figure what best plan for moving forward.

## 2022-11-02 NOTE — Progress Notes (Signed)
[facility] Note   HPI:  67 y.o. 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 Patient reports she is ok but still with drainage and some lower abdominal pain. She is getting her treatment for MM with Dr. Ellin Saba.   Review of Systems:  Vaginal discharge All other review of systems: otherwise negative   Vital Signs:  BP 121/77   Pulse 73   Temp 97.7 F (36.5 C) (Oral)   Resp 16   Ht 5\' 2"  (1.575 m)   Wt 283 lb (128.4 kg)   SpO2 98%   BMI 51.76 kg/m    Physical Exam:  Physical Exam HENT:     Head: Normocephalic.  Cardiovascular:     Rate and Rhythm: Normal rate.  Pulmonary:     Effort: Pulmonary effort is normal.  Abdominal:     General: There is no distension.     Palpations: Abdomen is soft.     Tenderness: There is no abdominal tenderness.  Musculoskeletal:     Comments: Nephrostomy tube in the left back, dressing changed and no signs of redness or drainage from the skin, tender with movement of the tube likely from the dressing being pulled partially off   Psychiatric:        Thought Content: Thought content normal.      Assessment:  67 y.o. yo Female with a colovaginal fistula and nephrostomy tube for hydroureter.  Plan:  Discussed with patient and her family again that we need to see what Urology thinks about the nephrostomy and discussed if pursuing a colectomy and likely a colostomy is what she wants and in her best interest. She does not want to live with the nephrostomy tube.   Will await to see what Dr. Alma Friendly says and decide about repeat CT san.   Future Appointments  Date Time Provider Department Center  11/08/2022 10:00 AM Marcine Matar, MD [facility] None  12/21/2022 11:30 AM [facility] NURSE [facility] None  12/28/2022 11:45 AM Doreatha Massed, MD [facility] None  01/02/2023 10:20 AM Mallipeddi, Orion Modest, MD [facility]   Will follow  up after further discussion.   Algis Greenhouse, MD [facility] [address] [place], [place] [address] [phone] (office)

## 2022-11-06 ENCOUNTER — Other Ambulatory Visit: Payer: Self-pay

## 2022-11-06 DIAGNOSIS — G8929 Other chronic pain: Secondary | ICD-10-CM

## 2022-11-06 MED ORDER — OXYCODONE-ACETAMINOPHEN 5-325 MG PO TABS: 1.0000 | ORAL_TABLET | Freq: Two times a day (BID) | ORAL | 0 refills | Status: DC | PRN

## 2022-11-07 NOTE — Progress Notes (Signed)
History of Present Illness: Kelli Hurley is a 67 y.o. year old female here for change follow-up colovesical fistula.  She was seen by Dr. Ver[facility] in July of this year during a hospitalization for sepsis, renal insufficiency, colovaginal fistula and left hydronephrosis.  Thus far, colovesical fistula has been treated conservatively.  She does have a left percutaneous nephrostomy tube placed during that hospitalization.  She has not had her nephrostomy tube changed since its initial placement.  She does have recurrent pneumaturia but has not had gross hematuria.  Past Medical History:  Diagnosis Date   Anemia    Arthritis    Cancer (HCC)    Claustrophobia 10/05/2014   Hypertension    Leukopenia 08/03/2014   Normocytic hypochromic anemia 08/03/2014   Renal disorder    stage 3     Past Surgical History:  Procedure Laterality Date   ABDOMINAL HYSTERECTOMY     CARDIAC SURGERY     61 months old. States she had a leaky valve.    COLONOSCOPY  03/26/2009   Dr. Darrick Penna; normal colon, small internal hemorrhoids.  Recommended repeat colonoscopy in 10 years.   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"   PORTACATH PLACEMENT Left 02/21/2019   Procedure: INSERTION PORT-A-CATH;  Surgeon: Lucretia Roers, MD;  Location: [facility];  Service: General;  Laterality: Left;    Home Medications:  (Not in a hospital admission)   Allergies:  Allergies  Allergen Reactions   Diclofenac Swelling    Per pt, facial swelling  Other Reaction(s): Other (See Comments)  Per pt, facial swelling, Per pt, facial swelling    Family History  Problem Relation Age of Onset   Cancer Maternal Grandmother    Cancer  Father    Hypertension Father    Cancer Mother    Hypertension Mother    Hypertension Brother    Prostate cancer Brother    Hypertension Brother    Hypertension Brother    Hypertension Sister    Hypertension Sister    Hypertension Sister    Hypertension Sister    Colon cancer Neg Hx    Colon polyps Neg Hx     Social History:  reports that she has never smoked. She has never used smokeless tobacco. She reports that she does not drink alcohol and does not use drugs.  ROS: A complete review of systems was performed.  All systems are negative except for pertinent findings as noted.  Physical Exam:  Vital signs in last 24 hours: @VSRANGES @ General:  Alert and oriented, No acute distress HEENT: Normocephalic, atraumatic Neck: No JVD or lymphadenopathy Cardiovascular: Regular rate  Lungs: Normal inspiratory/expiratory excursion Abdomen: Obese, nontender. Back: Percutaneous tube site fairly clean.  Old dressing was removed, replaced with new gauze. Extremities: No edema Neurologic: Grossly intact   I have reviewed notes from referring/previous physicians--hospital notes  I have reviewed urinalysis results  I have independently reviewed prior imaging--CT images  I have reviewed prior urine culture  Prior hospital labs reviewed  Cystoscopy Procedure Note:  Indication: Colovesical fistula  After informed consent and discussion of the procedure and its risks, Kelli Hurley was positioned and prepped in the standard fashion.  Cystoscopy was performed with a flexible cystoscope.   Findings: Urethra: No abnormalities. Ureteral orifices: Normal bilaterally Bladder: There are no urothelial lesions.  I did not appreciate a fistula.  There is a ridge in the midline of the posterior bladder running longitudinally.  No lesions seen along this.  The patient tolerated the procedure well.     Impression/Assessment:  Colonic fistula, I see no bladder abnormality.  This may  well be colovaginal.  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 Henreitta Leber once I see her back after either the nephrostomy tube has been removed or internalized  Bertram Millard Kelli Hurley 11/07/2022, 10:28 AM  Bertram Millard. Kelli Farabaugh MD

## 2022-11-08 ENCOUNTER — Encounter: Payer: Self-pay | Admitting: Urology

## 2022-11-08 ENCOUNTER — Ambulatory Visit (INDEPENDENT_AMBULATORY_CARE_PROVIDER_SITE_OTHER): Payer: 59 | Admitting: Urology

## 2022-11-08 VITALS — BP 143/78 | HR 76

## 2022-11-08 DIAGNOSIS — N321 Vesicointestinal fistula: Secondary | ICD-10-CM

## 2022-11-08 DIAGNOSIS — N1330 Unspecified hydronephrosis: Secondary | ICD-10-CM | POA: Diagnosis not present

## 2022-11-08 LAB — URINALYSIS, ROUTINE W REFLEX MICROSCOPIC
Bilirubin, UA: NEGATIVE
Glucose, UA: NEGATIVE
Ketones, UA: NEGATIVE
Nitrite, UA: NEGATIVE
RBC, UA: NEGATIVE
Specific Gravity, UA: 1.015 (ref 1.005–1.030)
Urobilinogen, Ur: 0.2 mg/dL (ref 0.2–1.0)
pH, UA: 6 (ref 5.0–7.5)

## 2022-11-08 LAB — MICROSCOPIC EXAMINATION: WBC, UA: >30 /[HPF] — AB (ref 0–5)

## 2022-11-08 MED ORDER — CIPROFLOXACIN HCL 500 MG PO TABS
500.0000 mg | ORAL_TABLET | Freq: Once | ORAL | Status: AC
Start: 2022-11-08 — End: 2022-11-08
  Administered 2022-11-08: 500 mg via ORAL

## 2022-11-13 ENCOUNTER — Other Ambulatory Visit (HOSPITAL_COMMUNITY)
Admission: RE | Admit: 2022-11-13 | Discharge: 2022-11-13 | Disposition: A | Discharge status: Home / Self Care | Payer: 59 | Source: Ambulatory Visit | Attending: Nephrology | Admitting: Nephrology

## 2022-11-13 DIAGNOSIS — D631 Anemia in chronic kidney disease: Secondary | ICD-10-CM | POA: Insufficient documentation

## 2022-11-13 DIAGNOSIS — N1832 Chronic kidney disease, stage 3b: Secondary | ICD-10-CM | POA: Insufficient documentation

## 2022-11-13 DIAGNOSIS — R809 Proteinuria, unspecified: Secondary | ICD-10-CM | POA: Diagnosis present

## 2022-11-13 LAB — RENAL FUNCTION PANEL
Albumin: 3.8 g/dL (ref 3.5–5.0)
Anion gap: 13 (ref 5–15)
BUN: 44 mg/dL — ABNORMAL HIGH (ref 8–23)
CO2: 32 mmol/L (ref 22–32)
Calcium: 9.7 mg/dL (ref 8.9–10.3)
Chloride: 91 mmol/L — ABNORMAL LOW (ref 98–111)
Creatinine, Ser: 2.07 mg/dL — ABNORMAL HIGH (ref 0.44–1.00)
GFR, Estimated: 26 mL/min — ABNORMAL LOW (ref >60)
Glucose, Bld: 110 mg/dL — ABNORMAL HIGH (ref 70–99)
Phosphorus: 3.6 mg/dL (ref 2.5–4.6)
Potassium: 3.3 mmol/L — ABNORMAL LOW (ref 3.5–5.1)
Sodium: 136 mmol/L (ref 135–145)

## 2022-11-13 LAB — CBC
HCT: 31.8 % — ABNORMAL LOW (ref 36.0–46.0)
Hemoglobin: 10.3 g/dL — ABNORMAL LOW (ref 12.0–15.0)
MCH: 29.9 pg (ref 26.0–34.0)
MCHC: 32.4 g/dL (ref 30.0–36.0)
MCV: 92.4 fL (ref 80.0–100.0)
Platelets: 252 10*3/uL (ref 150–400)
RBC: 3.44 MIL/uL — ABNORMAL LOW (ref 3.87–5.11)
RDW: 15.8 % — ABNORMAL HIGH (ref 11.5–15.5)
WBC: 7.5 10*3/uL (ref 4.0–10.5)
nRBC: 0 % (ref 0.0–0.2)

## 2022-11-13 LAB — PROTEIN / CREATININE RATIO, URINE
Creatinine, Urine: 49 mg/dL
Protein Creatinine Ratio: 0.29 mg/mg{creat} — ABNORMAL HIGH (ref 0.00–0.15)
Total Protein, Urine: 14 mg/dL

## 2022-11-14 LAB — PARATHYROID HORMONE, INTACT (NO CA): PTH: 43 pg/mL (ref 15–65)

## 2022-11-23 ENCOUNTER — Other Ambulatory Visit: Payer: Self-pay | Admitting: Student

## 2022-11-23 ENCOUNTER — Other Ambulatory Visit: Payer: Self-pay | Admitting: Radiology

## 2022-11-23 DIAGNOSIS — N1330 Unspecified hydronephrosis: Secondary | ICD-10-CM

## 2022-11-23 NOTE — H&P (Addendum)
Referring Physician(s): Dahlstedt,Stephen  Supervising Physician: Marliss Coots  Patient Status:  [facility] OP  Chief Complaint:  Left hydronephrosis  Subjective: Patient known to IR team from bone marrow biopsy in 2016, 2021, 2022, and a left percutaneous nephrostomy on 09/08/2022.  She is a 67 year old female with past medical history significant for anemia, renal insufficiency,multiple myeloma, arthritis, hypertension, conservatively treated colovaginal fistula and left hydronephrosis with left PCN placement performed on 09/08/22.  She presents today for left nephrostogram with possible removal of nephrostomy tube versus internalization if obstruction still noted. She denies fever,HA,CP, dyspnea, cough, abd/back pain,N/V or bleeding.    Past Medical History:  Diagnosis Date   Anemia    Arthritis    Cancer (HCC)    Claustrophobia 10/05/2014   Hypertension    Leukopenia 08/03/2014   Normocytic hypochromic anemia 08/03/2014   Renal disorder    stage 3    Past Surgical History:  Procedure Laterality Date   ABDOMINAL HYSTERECTOMY     CARDIAC SURGERY     51 months old. States she had a leaky valve.    COLONOSCOPY  03/26/2009   Dr. Darrick Penna; normal colon, small internal hemorrhoids.  Recommended repeat colonoscopy in 10 years.   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 Authorizing Provider  acetaminophen (TYLENOL) 650 MG CR tablet Take  650 mg by mouth every 8 (eight) hours as needed for pain.    [provider]  albuterol (VENTOLIN HFA) 108 (90 Base) MCG/ACT inhaler Inhale 2 puffs into the lungs every 6 (six) hours as needed for wheezing or shortness of breath. 02/18/20   Doreatha Massed, MD  apixaban (ELIQUIS) 5 MG TABS tablet Take 1 tablet (5 mg total) by mouth 2 (two) times daily. 09/10/22   Osvaldo Shipper, MD  calcitRIOL (ROCALTROL) 0.25 MCG capsule Take 0.25 mcg by mouth daily. 08/23/21   [provider]  dexamethasone (DECADRON) 4 MG tablet TAKE FIVE TABLETS BY MOUTH ONCE A WEEK 06/26/22   Doreatha Massed, MD  furosemide (LASIX) 20 MG tablet TAKE 2 TABLETS BY MOUTH DAILY IN THE MORNING AND TAKE 2 TABLETS AT BEDTIME Patient taking differently: Take 20 mg by mouth as directed. 2 tablet in the morning and 2 tablets at bedtime on Mon., Wed., and Friday. 1 tablet in the morning and 1 tablet at bedtime on Sun., Tues., Thurs., and Sat. 08/24/21   Christell Constant, MD  Infant Care Products [facility]) OINT Apply to affected area as needed Externally for 30 days 08/08/21   [provider]  lidocaine-prilocaine (EMLA) cream APPLY 1 QUARTER SIZED AMOUNT 1 HOUR PRIOR TO TREATMENT 04/10/22   Doreatha Massed, MD  magnesium oxide (MAG-OX) 400 (240 Mg) MG tablet TAKE 1 TABLET BY MOUTH TWICE DAILY 07/12/21   Doreatha Massed, MD  metolazone (ZAROXOLYN) 2.5 MG tablet Take 1 tablet (2.5 mg total) by mouth daily. Patient taking differently: Take 2.5 mg by mouth 3 (  three) times a week. Mon., Wed., and Friday 11/29/21   Doreatha Massed, MD  Multiple Vitamin (TAB-A-VITE) TABS TAKE 1 TABLET BY MOUTH ONCE DAILY. Patient taking differently: Take 1 tablet by mouth daily. 04/10/17   Hubbard Hartshorn, NP  oxyCODONE-acetaminophen (PERCOCET/ROXICET) 5-325 MG tablet Take 1-2 tablets by mouth every 12 (twelve) hours as needed for severe pain. TAKE 1 OR 2 TABLETS BY MOUTH EVERY TWELVE HOURS AS NEEDED for severe  pain 11/06/22   Doreatha Massed, MD  pomalidomide (POMALYST) 4 MG capsule Take 1 capsule (4 mg total) by mouth daily. 21 days on, 7 days off 10/25/22   Doreatha Massed, MD  potassium chloride SA (KLOR-CON M) 20 MEQ tablet TAKE 1 TABLET BY MOUTH THREE TIMES DAILY Patient taking differently: Take 20 mEq by mouth daily. 04/26/22   Doreatha Massed, MD  pregabalin (LYRICA) 75 MG capsule Take 1 capsule (75 mg total) by mouth 2 (two) times daily. 10/05/22   Doreatha Massed, MD  rosuvastatin (CRESTOR) 10 MG tablet TAKE ONE TABLET BY MOUTH ONCE DAILY 10/26/22   Chandrasekhar, Mahesh A, MD  Semaglutide, 2 MG/DOSE, (OZEMPIC, 2 MG/DOSE,) 8 MG/3ML SOPN Inject 2 mg into the skin once a week. 08/31/22   Meriam Sprague, MD  trolamine salicylate (ASPERCREME) 10 % cream Apply 1 application topically 2 (two) times daily as needed for muscle pain.    [provider]  VITAMIN D PO Take by mouth.    [provider]     Vital Signs: temp 97.7, BP 115/69  HR 78  R 18 O2 SATS 98% RA      Code Status: FULL CODE  Physical Exam: awake/alert; chest- CTA bilat; clean, intact left chest wall port a cath; heart- RRR; abd-soft,obese, +BS,NT; bilat pretibial edema  Imaging: No results found.  Labs:  CBC: Recent Labs    09/09/22 0337 09/10/22 0535 09/28/22 1134 11/13/22 1130  WBC 5.6 4.7 6.0 7.5  HGB 9.0* 8.7* 10.6* 10.3*  HCT 28.2* 26.9* 32.6* 31.8*  PLT 207 237 169 252    COAGS: Recent Labs    09/03/22 2026  INR 1.2  APTT 24    BMP: Recent Labs    09/09/22 0337 09/10/22 0535 09/28/22 1134 11/13/22 1130  NA 135 136 134* 136  K 3.7 3.6 3.1* 3.3*  CL 108 107 91* 91*  CO2 22 21* 31 32  GLUCOSE 96 86 93 110*  BUN 15 15 65* 44*  CALCIUM 8.4* 8.4* 9.4 9.7  CREATININE 1.72* 1.72* 2.25* 2.07*  GFRNONAA 33* 33* 24* 26*    LIVER FUNCTION TESTS: Recent Labs    06/21/22 1157 07/10/22 1209 09/03/22 2026 09/04/22 0449 09/06/22 0440 09/28/22 1134  11/13/22 1130  BILITOT 0.7  --  1.3* 1.0  --  0.7  --   AST 18  --  54* 97*  --  14*  --   ALT 13  --  18 27  --  10  --   ALKPHOS 49  --  43 45  --  50  --   PROT 6.9  --  6.6 6.1*  --  7.0  --   ALBUMIN 3.7   < > 3.4* 2.9* 2.5* 3.9 3.8   < > = values in this interval not displayed.    Assessment and Plan: 67 year old female with past medical history significant for anemia, renal insufficiency, multiple myeloma, arthritis, hypertension, conservatively treated colovaginal fistula and left hydronephrosis with left PCN placement performed on 09/08/22.  She presents today  for left nephrostogram with possible removal of nephrostomy tube versus internalization if obstruction still noted.  Details/risks of procedure, including but not limited to, internal bleeding, infection, injury to adjacent structures , inability to place stent discussed with patient with her understanding and consent.   Electronically Signed: D. Jeananne Rama, PA-C 11/23/2022, 2:49 PM   I spent a total of 20 minutes at the the patient's bedside AND on the patient's hospital floor or unit, greater than 50% of which was counseling/coordinating care for left nephrostogram with possible ureteral stent placement

## 2022-11-24 ENCOUNTER — Other Ambulatory Visit: Payer: Self-pay

## 2022-11-24 ENCOUNTER — Ambulatory Visit (HOSPITAL_COMMUNITY)
Admission: RE | Admit: 2022-11-24 | Discharge: 2022-11-24 | Disposition: A | Discharge status: Home / Self Care | Payer: 59 | Source: Ambulatory Visit | Attending: Urology | Admitting: Urology

## 2022-11-24 ENCOUNTER — Other Ambulatory Visit: Payer: Self-pay | Admitting: Urology

## 2022-11-24 ENCOUNTER — Encounter (HOSPITAL_COMMUNITY): Payer: Self-pay

## 2022-11-24 DIAGNOSIS — I10 Essential (primary) hypertension: Secondary | ICD-10-CM | POA: Diagnosis not present

## 2022-11-24 DIAGNOSIS — C9000 Multiple myeloma not having achieved remission: Secondary | ICD-10-CM | POA: Diagnosis not present

## 2022-11-24 DIAGNOSIS — D649 Anemia, unspecified: Secondary | ICD-10-CM | POA: Insufficient documentation

## 2022-11-24 DIAGNOSIS — N289 Disorder of kidney and ureter, unspecified: Secondary | ICD-10-CM | POA: Diagnosis not present

## 2022-11-24 DIAGNOSIS — N1330 Unspecified hydronephrosis: Secondary | ICD-10-CM | POA: Diagnosis present

## 2022-11-24 HISTORY — PX: IR URETERAL STENT PLACEMENT EXISTING ACCESS LEFT: IMG6073

## 2022-11-24 LAB — CBC WITH DIFFERENTIAL/PLATELET
Abs Immature Granulocytes: 0.13 10*3/uL — ABNORMAL HIGH (ref 0.00–0.07)
Basophils Absolute: 0 10*3/uL (ref 0.0–0.1)
Basophils Relative: 1 %
Eosinophils Absolute: 0.2 10*3/uL (ref 0.0–0.5)
Eosinophils Relative: 4 %
HCT: 30.3 % — ABNORMAL LOW (ref 36.0–46.0)
Hemoglobin: 9.7 g/dL — ABNORMAL LOW (ref 12.0–15.0)
Immature Granulocytes: 4 %
Lymphocytes Relative: 25 %
Lymphs Abs: 0.9 10*3/uL (ref 0.7–4.0)
MCH: 30.2 pg (ref 26.0–34.0)
MCHC: 32 g/dL (ref 30.0–36.0)
MCV: 94.4 fL (ref 80.0–100.0)
Monocytes Absolute: 0.8 10*3/uL (ref 0.1–1.0)
Monocytes Relative: 22 %
Neutro Abs: 1.7 10*3/uL (ref 1.7–7.7)
Neutrophils Relative %: 44 %
Platelets: 206 10*3/uL (ref 150–400)
RBC: 3.21 MIL/uL — ABNORMAL LOW (ref 3.87–5.11)
RDW: 15.9 % — ABNORMAL HIGH (ref 11.5–15.5)
WBC: 3.7 10*3/uL — ABNORMAL LOW (ref 4.0–10.5)
nRBC: 0 % (ref 0.0–0.2)

## 2022-11-24 LAB — BASIC METABOLIC PANEL
Anion gap: 12 (ref 5–15)
BUN: 47 mg/dL — ABNORMAL HIGH (ref 8–23)
CO2: 29 mmol/L (ref 22–32)
Calcium: 9.3 mg/dL (ref 8.9–10.3)
Chloride: 98 mmol/L (ref 98–111)
Creatinine, Ser: 2.09 mg/dL — ABNORMAL HIGH (ref 0.44–1.00)
GFR, Estimated: 26 mL/min — ABNORMAL LOW (ref >60)
Glucose, Bld: 103 mg/dL — ABNORMAL HIGH (ref 70–99)
Potassium: 3.2 mmol/L — ABNORMAL LOW (ref 3.5–5.1)
Sodium: 139 mmol/L (ref 135–145)

## 2022-11-24 LAB — PROTIME-INR
INR: 1.3 — ABNORMAL HIGH (ref 0.8–1.2)
Prothrombin Time: 15.9 s — ABNORMAL HIGH (ref 11.4–15.2)

## 2022-11-24 LAB — GLUCOSE, CAPILLARY: Glucose-Capillary: 93 mg/dL (ref 70–99)

## 2022-11-24 MED ORDER — IOHEXOL 300 MG/ML  SOLN
50.0000 mL | Freq: Once | INTRAMUSCULAR | Status: AC | PRN
  Administered 2022-11-24: 20 mL

## 2022-11-24 MED ORDER — MIDAZOLAM HCL 2 MG/2ML IJ SOLN
INTRAMUSCULAR | Status: AC | PRN
Start: 2022-11-24 — End: 2022-11-24
  Administered 2022-11-24: 1 mg via INTRAVENOUS

## 2022-11-24 MED ORDER — FENTANYL CITRATE (PF) 100 MCG/2ML IJ SOLN
INTRAMUSCULAR | Status: AC
  Filled 2022-11-24: qty 2

## 2022-11-24 MED ORDER — HEPARIN SOD (PORK) LOCK FLUSH 100 UNIT/ML IV SOLN
500.0000 [IU] | Freq: Once | INTRAVENOUS | Status: AC
  Administered 2022-11-24: 500 [IU] via INTRAVENOUS
  Filled 2022-11-24: qty 5

## 2022-11-24 MED ORDER — SODIUM CHLORIDE 0.9% FLUSH: 5.0000 mL | Freq: Three times a day (TID) | INTRAVENOUS | Status: DC

## 2022-11-24 MED ORDER — SODIUM CHLORIDE 0.9 % IV SOLN
INTRAVENOUS | Status: AC
  Filled 2022-11-24: qty 20

## 2022-11-24 MED ORDER — MIDAZOLAM HCL 2 MG/2ML IJ SOLN
INTRAMUSCULAR | Status: AC
  Filled 2022-11-24: qty 4

## 2022-11-24 MED ORDER — POMALIDOMIDE 4 MG PO CAPS: 4.0000 mg | ORAL_CAPSULE | Freq: Every day | ORAL | 0 refills | Status: DC

## 2022-11-24 MED ORDER — LIDOCAINE-EPINEPHRINE 1 %-1:100000 IJ SOLN
20.0000 mL | Freq: Once | INTRAMUSCULAR | Status: AC
  Administered 2022-11-24: 3 mL via INTRADERMAL

## 2022-11-24 MED ORDER — SODIUM CHLORIDE 0.9% FLUSH: 10.0000 mL | INTRAVENOUS | Status: DC | PRN

## 2022-11-24 MED ORDER — LIDOCAINE-EPINEPHRINE 1 %-1:100000 IJ SOLN
INTRAMUSCULAR | Status: AC
  Filled 2022-11-24: qty 1

## 2022-11-24 MED ORDER — SODIUM CHLORIDE 0.9% FLUSH: 10.0000 mL | Freq: Two times a day (BID) | INTRAVENOUS | Status: DC

## 2022-11-24 MED ORDER — SODIUM CHLORIDE 0.9 % IV SOLN
2.0000 g | Freq: Once | INTRAVENOUS | Status: AC
  Administered 2022-11-24: 2 g via INTRAVENOUS

## 2022-11-24 MED ORDER — FENTANYL CITRATE (PF) 100 MCG/2ML IJ SOLN
INTRAMUSCULAR | Status: AC | PRN
Start: 2022-11-24 — End: 2022-11-24
  Administered 2022-11-24: 50 ug via INTRAVENOUS

## 2022-11-24 NOTE — Discharge Instructions (Addendum)
Please call Interventional Radiology clinic [phone] with any questions or concerns.   After the procedure, it is common to have: Some soreness where the nephrostomy tube was inserted (tube insertion site) Blood-tinged drainage from the nephrostomy tube for the first 24 hours  Follow these instructions at home:  Medication: Do not use Aspirin or ibuprofen products, such as Advil or Motrin, as it may increase bleeding.  You may resume your usual medications as ordered by your doctor If your doctor prescribed antibiotics, take them as directed. Do not stop taking them just because you feel better. You need to take the full course of antibiotics.  Eating and drinking: Drink plenty of liquids to keep your urine pale yellow You can resume your regular diet as directed by your doctor  Care of the procedure site Follow instructions from your health care provider about how to take care of your tube insertion site. Make sure you: Wash your hands with soap and water for at least 20 seconds before you change your dressing. If soap and water are not available, use hand sanitizer. Change your dressing as told by your health care provider. Be careful not to pull on the tube while removing the dressing When you change the dressing, wash the skin around the tube, rinse well, and pat the skin dry Avoid using scissors to remove the dressing. Sharp objects may damage the catheter Check the tube insertion area every day for signs of infection. Check for: Redness, swelling, or pain Fluid or blood Warmth Pus or a bad smell Care of the nephrostomy tube and drainage bag Always keep the tubing, the leg bag, or the bedside drainage bags below the level of the kidney so that your urine drains freely When connecting your nephrostomy tube to a drainage bag, make sure that there are no kinks in the tubing and that your urine is draining freely. You may want to use an elastic bandage to wrap any exposed tubing  that goes from the nephrostomy tube to any of the connecting tubes. At night, you may want to connect your nephrostomy tube or the leg bag to a larger bedside drainage bag Follow instructions from your health care provider about how to empty or change the drainage bag Empty the drainage bag when it becomes ? full Replace the drainage bag and any extension tubing that is connected to your nephrostomy tube every 7 days or as told by your health care provider. Your health care provider will explain how to change the drainage bag and extension tubing. The nephrostomy tube will need to be changed every 8-12 weeks   Activity Do not lift anything that is heavier than 10 lb (4.5 kg), or the limit that you are told, until your health care provider says that it is safe Return to your normal activities as told by your health care provider.  Avoid activities that may cause the nephrostomy tubing to bend Do not take baths, swim, or use a hot tub until your health care provider approves. Ask your health care provider if you can take showers.  Cover the nephrostomy tube bandage (dressing) with a watertight covering when you take a shower Keep all follow-up visits as told by your doctor  Contact a health care provider if: You have problems with any of the valves or tubing You have persistent pain or soreness in your back You have redness, swelling, or pain around your tube insertion site You have fluid or blood coming from your tube insertion site  Your tube insertion site feels warm to the touch You have pus or a bad smell coming from your tube insertion site You have increased urine output or you feel burning when urinating  Get help right away if: You have pain in your abdomen during the first week You have chest pain or have trouble breathing You have a new appearance of blood in your urine You have a fever or chills You have back pain that is not relieved by your medicine You have decreased urine  output Your nephrostomy tube comes out   Moderate Conscious Sedation-Care After  This sheet gives you information about how to care for yourself after your procedure. Your health care provider may also give you more specific instructions. If you have problems or questions, contact your health care provider.  After the procedure, it is common to have: Sleepiness for several hours. Impaired judgment for several hours. Difficulty with balance. Vomiting if you eat too soon.  Follow these instructions at home:  Rest. Do not participate in activities where you could fall or become injured. Do not drive or use machinery. Do not drink alcohol. Do not take sleeping pills or medicines that cause drowsiness. Do not make important decisions or sign legal documents. Do not take care of children on your own.  Eating and drinking Follow the diet recommended by your health care provider. Drink enough fluid to keep your urine pale yellow. If you vomit: Drink water, juice, or soup when you can drink without vomiting. Make sure you have little or no nausea before eating solid foods.  General instructions Take over-the-counter and prescription medicines only as told by your health care provider. Have a responsible adult stay with you for the time you are told. It is important to have someone help care for you until you are awake and alert. Do not smoke. Keep all follow-up visits as told by your health care provider. This is important.  Contact a health care provider if: You are still sleepy or having trouble with balance after 24 hours. You feel light-headed. You keep feeling nauseous or you keep vomiting. You develop a rash. You have a fever. You have redness or swelling around the IV site.  Get help right away if: You have trouble breathing. You have new-onset confusion at home.  This information is not intended to replace advice given to you by your health care provider. Make sure you  discuss any questions you have with your healthcare provider.

## 2022-11-24 NOTE — Telephone Encounter (Signed)
Chart reviewed. Revlimid refilled per last office note with Dr. Katragadda.  

## 2022-11-24 NOTE — Procedures (Signed)
Interventional Radiology Procedure Note  Procedure:  1) Left nephrostogram 2) Placement of left nephroureteral stent 3) Exchange of nephrostomy tube  Findings: Please refer to procedural dictation for full description. 22 cm double J nephroureteral stent placed.  Exchange of 10 Fr PCN, capped.    Complications: None immediate  Estimated Blood Loss: < 5 mL  Recommendations: Keep PCN capped. IR has arranged for 2 week nephrostomy tube check and removal if capping trial tolerated.   Marliss Coots, MD

## 2022-11-27 ENCOUNTER — Ambulatory Visit (INDEPENDENT_AMBULATORY_CARE_PROVIDER_SITE_OTHER): Payer: 59 | Admitting: Urology

## 2022-11-27 ENCOUNTER — Encounter: Payer: Self-pay | Admitting: Urology

## 2022-11-27 VITALS — BP 111/73 | HR 92

## 2022-11-27 DIAGNOSIS — N1330 Unspecified hydronephrosis: Secondary | ICD-10-CM | POA: Diagnosis not present

## 2022-11-27 DIAGNOSIS — N824 Other female intestinal-genital tract fistulae: Secondary | ICD-10-CM

## 2022-11-27 NOTE — Progress Notes (Signed)
History of Present Illness: Kelli Hurley is a 67 y.o. year old female for recheck.  She has a probable colovaginal fistula.  On original presentation she had left hydronephrosis.  That was managed with a percutaneous nephrostomy tube on the left.  Following our visit on the second of this month, I sent her to interventional radiology for internalization of her nephrostomy.  She now has double-J stent still has her nephrostomy tube which has been capped.  She has had no difficulty/pain since her tube was capped.  She is not having significant lower urinary tract symptoms.  Past Medical History:  Diagnosis Date   Anemia    Arthritis    Cancer (HCC)    Claustrophobia 10/05/2014   Hypertension    Leukopenia 08/03/2014   Normocytic hypochromic anemia 08/03/2014   Renal disorder    stage 3     Past Surgical History:  Procedure Laterality Date   ABDOMINAL HYSTERECTOMY     CARDIAC SURGERY     31 months old. States she had a leaky valve.    COLONOSCOPY  03/26/2009   Dr. Darrick Penna; normal colon, small internal hemorrhoids.  Recommended repeat colonoscopy in 10 years.   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"   PORTACATH PLACEMENT Left 02/21/2019   Procedure: INSERTION PORT-A-CATH;  Surgeon: Lucretia Roers, MD;  Location: [facility];  Service: General;  Laterality: Left;    Home Medications:  (Not in a hospital admission)   Allergies:  Allergies  Allergen Reactions   Diclofenac Swelling    Per pt, facial swelling  Other Reaction(s): Other (See Comments)  Per pt, facial swelling, Per pt, facial swelling    Family History  Problem Relation Age of Onset    Cancer Maternal Grandmother    Cancer Father    Hypertension Father    Cancer Mother    Hypertension Mother    Hypertension Brother    Prostate cancer Brother    Hypertension Brother    Hypertension Brother    Hypertension Sister    Hypertension Sister    Hypertension Sister    Hypertension Sister    Colon cancer Neg Hx    Colon polyps Neg Hx     Social History:  reports that she has never smoked. She has never used smokeless tobacco. She reports that she does not drink alcohol and does not use drugs.  ROS: A complete review of systems was performed.  All systems are negative except for pertinent findings as noted.  Physical Exam:  Vital signs in last 24 hours: @VSRANGES @ General:  Alert and oriented, No acute distress HEENT: Normocephalic, atraumatic Neck: No JVD or lymphadenopathy Cardiovascular: Regular rate  Lungs: Normal inspiratory/expiratory excursion Abdomen: Nephrostomy tube site is clean Extremities: No edema Neurologic: Grossly intact  I have reviewed prior pt notes  I have reviewed notes from referring/previous physicians  IR notes reviewed  Nephrostomy tube was removed intact.  Patient tolerated procedure well  Impression/Assessment:  1.  Left hydronephrosis, now with stent in place  2.  Probable colovaginal fistula.  Plan:  1.  At this point, she will have her stent left in  place  2.  I am fine with her having any surgical procedure done.  If urology necessary during procedure, please do not hesitate to contact us  3.  She will need eventual stent removal following her surgery.  I have asked her to call us to tell her when her surgery will be planned so that we can arrange for proper follow-up  Chelsea Aus 11/27/2022, 11:13 AM  Bertram Millard. Bobette Leyh MD

## 2022-11-28 LAB — URINALYSIS, ROUTINE W REFLEX MICROSCOPIC
Bilirubin, UA: NEGATIVE
Glucose, UA: NEGATIVE
Ketones, UA: NEGATIVE
Nitrite, UA: NEGATIVE
Specific Gravity, UA: 1.02 (ref 1.005–1.030)
Urobilinogen, Ur: 0.2 mg/dL (ref 0.2–1.0)
pH, UA: 7 (ref 5.0–7.5)

## 2022-11-28 LAB — MICROSCOPIC EXAMINATION
Bacteria, UA: NONE SEEN
RBC, Urine: >30 /[HPF] — AB (ref 0–2)

## 2022-12-11 ENCOUNTER — Other Ambulatory Visit: Payer: Self-pay

## 2022-12-11 DIAGNOSIS — M25562 Pain in left knee: Secondary | ICD-10-CM

## 2022-12-11 MED ORDER — OXYCODONE-ACETAMINOPHEN 5-325 MG PO TABS: 1.0000 | ORAL_TABLET | Freq: Two times a day (BID) | ORAL | 0 refills | Status: DC | PRN

## 2022-12-12 ENCOUNTER — Encounter: Payer: Self-pay | Admitting: General Surgery

## 2022-12-12 ENCOUNTER — Ambulatory Visit (INDEPENDENT_AMBULATORY_CARE_PROVIDER_SITE_OTHER): Payer: 59 | Admitting: General Surgery

## 2022-12-12 ENCOUNTER — Ambulatory Visit (HOSPITAL_COMMUNITY): Admission: RE | Admit: 2022-12-12 | Payer: 59 | Source: Ambulatory Visit

## 2022-12-12 ENCOUNTER — Inpatient Hospital Stay: Payer: 59 | Attending: Hematology

## 2022-12-12 VITALS — BP 127/82 | HR 80 | Temp 98.4°F | Resp 16 | Ht 62.0 in | Wt 286.0 lb

## 2022-12-12 DIAGNOSIS — Z90710 Acquired absence of both cervix and uterus: Secondary | ICD-10-CM | POA: Diagnosis not present

## 2022-12-12 DIAGNOSIS — Z8042 Family history of malignant neoplasm of prostate: Secondary | ICD-10-CM | POA: Diagnosis not present

## 2022-12-12 DIAGNOSIS — Z7901 Long term (current) use of anticoagulants: Secondary | ICD-10-CM | POA: Insufficient documentation

## 2022-12-12 DIAGNOSIS — Z95828 Presence of other vascular implants and grafts: Secondary | ICD-10-CM

## 2022-12-12 DIAGNOSIS — D631 Anemia in chronic kidney disease: Secondary | ICD-10-CM | POA: Insufficient documentation

## 2022-12-12 DIAGNOSIS — Z8719 Personal history of other diseases of the digestive system: Secondary | ICD-10-CM | POA: Insufficient documentation

## 2022-12-12 DIAGNOSIS — N823 Fistula of vagina to large intestine: Secondary | ICD-10-CM | POA: Insufficient documentation

## 2022-12-12 DIAGNOSIS — Z809 Family history of malignant neoplasm, unspecified: Secondary | ICD-10-CM | POA: Diagnosis not present

## 2022-12-12 DIAGNOSIS — Z5986 Financial insecurity: Secondary | ICD-10-CM | POA: Insufficient documentation

## 2022-12-12 DIAGNOSIS — C9002 Multiple myeloma in relapse: Secondary | ICD-10-CM | POA: Insufficient documentation

## 2022-12-12 DIAGNOSIS — Z79899 Other long term (current) drug therapy: Secondary | ICD-10-CM | POA: Diagnosis not present

## 2022-12-12 DIAGNOSIS — I129 Hypertensive chronic kidney disease with stage 1 through stage 4 chronic kidney disease, or unspecified chronic kidney disease: Secondary | ICD-10-CM | POA: Diagnosis not present

## 2022-12-12 DIAGNOSIS — E876 Hypokalemia: Secondary | ICD-10-CM | POA: Diagnosis not present

## 2022-12-12 DIAGNOSIS — C9000 Multiple myeloma not having achieved remission: Secondary | ICD-10-CM

## 2022-12-12 DIAGNOSIS — G629 Polyneuropathy, unspecified: Secondary | ICD-10-CM | POA: Insufficient documentation

## 2022-12-12 DIAGNOSIS — R0602 Shortness of breath: Secondary | ICD-10-CM | POA: Insufficient documentation

## 2022-12-12 DIAGNOSIS — E611 Iron deficiency: Secondary | ICD-10-CM | POA: Diagnosis not present

## 2022-12-12 DIAGNOSIS — Z8249 Family history of ischemic heart disease and other diseases of the circulatory system: Secondary | ICD-10-CM | POA: Insufficient documentation

## 2022-12-12 DIAGNOSIS — N189 Chronic kidney disease, unspecified: Secondary | ICD-10-CM | POA: Insufficient documentation

## 2022-12-12 DIAGNOSIS — Z7982 Long term (current) use of aspirin: Secondary | ICD-10-CM | POA: Insufficient documentation

## 2022-12-12 DIAGNOSIS — Z7961 Long term (current) use of immunomodulator: Secondary | ICD-10-CM | POA: Diagnosis not present

## 2022-12-12 LAB — CBC WITH DIFFERENTIAL/PLATELET
Abs Immature Granulocytes: 0.3 10*3/uL — ABNORMAL HIGH (ref 0.00–0.07)
Basophils Absolute: 0.1 10*3/uL (ref 0.0–0.1)
Basophils Relative: 1 %
Eosinophils Absolute: 0 10*3/uL (ref 0.0–0.5)
Eosinophils Relative: 0 %
HCT: 30.4 % — ABNORMAL LOW (ref 36.0–46.0)
Hemoglobin: 9.7 g/dL — ABNORMAL LOW (ref 12.0–15.0)
Lymphocytes Relative: 9 %
Lymphs Abs: 0.6 10*3/uL — ABNORMAL LOW (ref 0.7–4.0)
MCH: 29.8 pg (ref 26.0–34.0)
MCHC: 31.9 g/dL (ref 30.0–36.0)
MCV: 93.5 fL (ref 80.0–100.0)
Metamyelocytes Relative: 3 %
Monocytes Absolute: 0.1 10*3/uL (ref 0.1–1.0)
Monocytes Relative: 2 %
Myelocytes: 1 %
Neutro Abs: 5.8 10*3/uL (ref 1.7–7.7)
Neutrophils Relative %: 84 %
Platelets: 228 10*3/uL (ref 150–400)
RBC: 3.25 MIL/uL — ABNORMAL LOW (ref 3.87–5.11)
RDW: 16.2 % — ABNORMAL HIGH (ref 11.5–15.5)
WBC: 6.9 10*3/uL (ref 4.0–10.5)
nRBC: 0 % (ref 0.0–0.2)

## 2022-12-12 LAB — COMPREHENSIVE METABOLIC PANEL
ALT: 15 U/L (ref 0–44)
AST: 16 U/L (ref 15–41)
Albumin: 3.6 g/dL (ref 3.5–5.0)
Alkaline Phosphatase: 52 U/L (ref 38–126)
Anion gap: 13 (ref 5–15)
BUN: 34 mg/dL — ABNORMAL HIGH (ref 8–23)
CO2: 28 mmol/L (ref 22–32)
Calcium: 9.7 mg/dL (ref 8.9–10.3)
Chloride: 94 mmol/L — ABNORMAL LOW (ref 98–111)
Creatinine, Ser: 1.92 mg/dL — ABNORMAL HIGH (ref 0.44–1.00)
GFR, Estimated: 28 mL/min — ABNORMAL LOW (ref >60)
Glucose, Bld: 119 mg/dL — ABNORMAL HIGH (ref 70–99)
Potassium: 3.3 mmol/L — ABNORMAL LOW (ref 3.5–5.1)
Sodium: 135 mmol/L (ref 135–145)
Total Bilirubin: 0.7 mg/dL (ref <1.2)
Total Protein: 6.6 g/dL (ref 6.5–8.1)

## 2022-12-12 LAB — MAGNESIUM: Magnesium: 2 mg/dL (ref 1.7–2.4)

## 2022-12-12 MED ORDER — HEPARIN SOD (PORK) LOCK FLUSH 100 UNIT/ML IV SOLN
500.0000 [IU] | Freq: Once | INTRAVENOUS | Status: AC
  Administered 2022-12-12: 500 [IU] via INTRAVENOUS

## 2022-12-12 MED ORDER — SODIUM CHLORIDE 0.9% FLUSH
10.0000 mL | Freq: Once | INTRAVENOUS | Status: AC
  Administered 2022-12-12: 10 mL via INTRAVENOUS

## 2022-12-12 NOTE — Patient Instructions (Addendum)
Will get CT to look at area before proceeding with surgery. I will talk to Dr. Fabiola Backer, Cardiology and make the best plan for your for surgery.  Will see you in the office for follow up to discuss surgery.

## 2022-12-12 NOTE — Progress Notes (Signed)
Kelli Hurley presented for Portacath access and flush. Proper placement of portacath confirmed by CXR. Portacath located left chest wall accessed with  H 20 needle. Good blood return present. Portacath flushed with 20ml NS and 500U/83ml Heparin and needle removed intact. Procedure without incident. Patient tolerated procedure well.

## 2022-12-12 NOTE — Patient Instructions (Signed)
[facility] - A DEPT OF [facility]  Discharge Instructions: Thank you for choosing [facility] to provide your oncology and hematology care.  If you have a lab appointment with the Cancer Center - please note that after April 8th, 2024, all labs will be drawn in the cancer center.  You do not have to check in or register with the main entrance as you have in the past but will complete your check-in in the cancer center.  Wear comfortable clothing and clothing appropriate for easy access to any Portacath or PICC line.   We strive to give you quality time with your provider. You may need to reschedule your appointment if you arrive late (15 or more minutes).  Arriving late affects you and other patients whose appointments are after yours.  Also, if you miss three or more appointments without notifying the office, you may be dismissed from the clinic at the provider's discretion.      For prescription refill requests, have your pharmacy contact our office and allow 72 hours for refills to be completed.    Today you received the following: Port flush with labs   To help prevent nausea and vomiting after your treatment, we encourage you to take your nausea medication as directed.  BELOW ARE SYMPTOMS THAT SHOULD BE REPORTED IMMEDIATELY: *FEVER GREATER THAN 100.4 F (38 C) OR HIGHER *CHILLS OR SWEATING *NAUSEA AND VOMITING THAT IS NOT CONTROLLED WITH YOUR NAUSEA MEDICATION *UNUSUAL SHORTNESS OF BREATH *UNUSUAL BRUISING OR BLEEDING *URINARY PROBLEMS (pain or burning when urinating, or frequent urination) *BOWEL PROBLEMS (unusual diarrhea, constipation, pain near the anus) TENDERNESS IN MOUTH AND THROAT WITH OR WITHOUT PRESENCE OF ULCERS (sore throat, sores in mouth, or a toothache) UNUSUAL RASH, SWELLING OR PAIN  UNUSUAL VAGINAL DISCHARGE OR ITCHING   Items with * indicate a potential emergency and should be followed up as soon as possible or go to  the Emergency Department if any problems should occur.  Please show the CHEMOTHERAPY ALERT CARD or IMMUNOTHERAPY ALERT CARD at check-in to the Emergency Department and triage nurse.  Should you have questions after your visit or need to cancel or reschedule your appointment, please contact [facility] - A DEPT OF [facility] [phone]  and follow the prompts.  Office hours are 8:00 a.m. to 4:30 p.m. Monday - Friday. Please note that voicemails left after 4:00 p.m. may not be returned until the following business day.  We are closed weekends and major holidays. You have access to a nurse at all times for urgent questions. Please call the main number to the clinic [phone] and follow the prompts.  For any non-urgent questions, you may also contact your provider using MyChart. We now offer e-Visits for anyone 36 and older to request care online for non-urgent symptoms. For details visit [email or web address].   Also download the MyChart app! Go to the app store, search "MyChart", open the app, select [facility], and log in with your MyChart username and password.

## 2022-12-13 ENCOUNTER — Other Ambulatory Visit (HOSPITAL_COMMUNITY): Payer: Self-pay | Admitting: Family Medicine

## 2022-12-13 ENCOUNTER — Encounter: Payer: Self-pay | Admitting: *Deleted

## 2022-12-13 DIAGNOSIS — Z1231 Encounter for screening mammogram for malignant neoplasm of breast: Secondary | ICD-10-CM

## 2022-12-13 LAB — KAPPA/LAMBDA LIGHT CHAINS
Kappa free light chain: 39 mg/L — ABNORMAL HIGH (ref 3.3–19.4)
Kappa, lambda light chain ratio: 1.41 (ref 0.26–1.65)
Lambda free light chains: 27.6 mg/L — ABNORMAL HIGH (ref 5.7–26.3)

## 2022-12-13 NOTE — Progress Notes (Signed)
[facility]  Left ureter stent has been internalized. She is doing better. Still with drainage from her vagina 3 + times a week. She is feeling better overall.   BP 127/82   Pulse 80   Temp 98.4 F (36.9 C) (Oral)   Resp 16   Ht 5\' 2"  (1.575 m)   Wt 286 lb (129.7 kg)   SpO2 92%   BMI 52.31 kg/m  Nontender, nondistended  Patient with colovaginal fistula. Left ureter stent internalized.  CT ordered to assess the inflammation in the area.  Discussed with her that end colostomy in future given the issues with the ureter. 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   She also sees Dr. Ellin Saba for her multiple myeloma so need to verify any need to hold any therapy for surgery.   She is also seeing Dr. Jenene Slicker  so we will need to make sure she is risk stratified.   She gets her CT 12/27/22.   Future Appointments  Date Time Provider Department Center  12/20/2022 11:00 AM Doreatha Massed, MD [facility] None  12/27/2022 11:45 AM AP-MM 1 AP-MM [place] [facility]  12/27/2022 12:00 PM AP-CT 1 AP-CT [facility]  01/02/2023 10:20 AM Mallipeddi, Orion Modest, MD [facility]  01/02/2023 11:45 AM Lucretia Roers, MD [facility] None    Algis Greenhouse, MD [facility] [address] [place], [place] [address] [phone] (office)

## 2022-12-14 LAB — PROTEIN ELECTROPHORESIS, SERUM
A/G Ratio: 1.5 (ref 0.7–1.7)
Albumin ELP: 3.6 g/dL (ref 2.9–4.4)
Alpha-1-Globulin: 0.3 g/dL (ref 0.0–0.4)
Alpha-2-Globulin: 0.7 g/dL (ref 0.4–1.0)
Beta Globulin: 0.9 g/dL (ref 0.7–1.3)
Gamma Globulin: 0.6 g/dL (ref 0.4–1.8)
Globulin, Total: 2.4 g/dL (ref 2.2–3.9)
M-Spike, %: 0.2 g/dL — ABNORMAL HIGH
Total Protein ELP: 6 g/dL (ref 6.0–8.5)

## 2022-12-18 LAB — IMMUNOFIXATION ELECTROPHORESIS
IgA: 108 mg/dL (ref 87–352)
IgG (Immunoglobin G), Serum: 758 mg/dL (ref 586–1602)
IgM (Immunoglobulin M), Srm: 46 mg/dL (ref 26–217)
Total Protein ELP: 6.5 g/dL (ref 6.0–8.5)

## 2022-12-19 ENCOUNTER — Other Ambulatory Visit: Payer: Self-pay

## 2022-12-19 MED ORDER — POMALIDOMIDE 4 MG PO CAPS: 4.0000 mg | ORAL_CAPSULE | Freq: Every day | ORAL | 0 refills | Status: DC

## 2022-12-19 NOTE — Telephone Encounter (Signed)
Chart reviewed. Pomalyst refilled per last office note with Dr. Katragadda.  

## 2022-12-20 ENCOUNTER — Inpatient Hospital Stay (HOSPITAL_BASED_OUTPATIENT_CLINIC_OR_DEPARTMENT_OTHER): Payer: 59 | Admitting: Hematology

## 2022-12-20 VITALS — BP 139/80 | HR 73 | Temp 97.1°F | Resp 18 | Wt 302.1 lb

## 2022-12-20 DIAGNOSIS — C9000 Multiple myeloma not having achieved remission: Secondary | ICD-10-CM

## 2022-12-20 DIAGNOSIS — C9002 Multiple myeloma in relapse: Secondary | ICD-10-CM | POA: Diagnosis not present

## 2022-12-20 NOTE — Patient Instructions (Addendum)
[facility] Discharge Instructions   You were seen and examined today by Dr. Ellin Saba.  He reviewed the results of your lab work which are normal/stable. Your myeloma labs are staying stable.   Continue Pomalyst as prescribed. Hold it one week before surgery and restart 2 weeks after surgery.   We will see you back in 3 months. We will repeat lab work prior to this visit.   Return as scheduled.    Thank you for choosing [facility] at [facility] to provide your oncology and hematology care.  To afford each patient quality time with our provider, please arrive at least 15 minutes before your scheduled appointment time.   If you have a lab appointment with the Cancer Center please come in thru the Main Entrance and check in at the main information desk.  You need to re-schedule your appointment should you arrive 10 or more minutes late.  We strive to give you quality time with our providers, and arriving late affects you and other patients whose appointments are after yours.  Also, if you no show three or more times for appointments you may be dismissed from the clinic at the providers discretion.     Again, thank you for choosing [facility].  Our hope is that these requests will decrease the amount of time that you wait before being seen by our physicians.       _____________________________________________________________  Should you have questions after your visit to [facility], please contact our office at [phone] and follow the prompts.  Our office hours are 8:00 a.m. and 4:30 p.m. Monday - Friday.  Please note that voicemails left after 4:00 p.m. may not be returned until the following business day.  We are closed weekends and major holidays.  You do have access to a nurse 24-7, just call the main number to the clinic [phone] and do not press any options, hold on the line and a nurse will  answer the phone.    For prescription refill requests, have your pharmacy contact our office and allow 72 hours.    Due to Covid, you will need to wear a mask upon entering the hospital. If you do not have a mask, a mask will be given to you at the Main Entrance upon arrival. For doctor visits, patients may have 1 support person age 76 or older with them. For treatment visits, patients can not have anyone with them due to social distancing guidelines and our immunocompromised population.

## 2022-12-20 NOTE — Progress Notes (Unsigned)
Patient is taking Pomalyst as prescribed.  She has not missed any doses and reports no side effects at this time.   

## 2022-12-20 NOTE — Progress Notes (Signed)
[facility] [address] [place] [address]    Clinic Day:  12/21/22   Referring physician: [facility]  Patient Care Team: Alvina Filbert, MD as PCP - General (Internal Medicine) Mallipeddi, Orion Modest, MD as PCP - Cardiology (Cardiology) Doreatha Massed, MD as Consulting Physician (Medical Oncology) Nyoka Cowden, MD as Consulting Physician (Pulmonary Disease)   ASSESSMENT & PLAN:   Assessment: 1.  Relapsed IgG kappa multiple myeloma with high risk features: -5 cycles of carfilzomib, pomalidomide and dexamethasone from 03/05/2019 through 06/25/2019. -Myeloma panel on 07/09/2019 shows M spike 0.3 g, slightly up from 0.2 g previously.  However free light chain ratio has 1.37 and improved.  Kappa light chains are 24.8. - Carfilzomib discontinued after 08/06/2019 secondary to fluid retention. - She is currently on Pomalyst 4 mg 3 weeks on 1 week off. - PET scan on 03/22/2020 with no evidence of malignancy. - Bone marrow biopsy on 03/19/2020 showed approximately 5% plasma cells.  Chromosome analysis and FISH panel were normal.   2.  Shortness of breath on exertion: -2D echo on 02/09/2019 shows EF 60-65%.  No LVH. -Chest CT PE protocol on 02/24/2019 was negative. -Cardiac MRI on 03/07/2019 shows LVEF 56% with no amyloid.  Troponin T and proBNP were negative. -PFTs show low expiratory reserve volume consistent with body habitus.  Moderate diffusion defect which corrects to normal.  FVC, FEV1, FEV1/FVC ratio are within normal limits.  FVC is reduced relative to SVC indicates air-trapping.  No significant response after bronchodilators.  Reduced diffusion capacity indicates moderate loss of functional alveolar capillary space. -She was evaluated by Dr. Sherene Sires. -2D echocardiogram on 08/27/2019 shows LVEF 60 to 65%.  No LVH.   3.  Myeloma bone disease: -Bone density on 07/04/2019 shows T score 1.8.    Plan: 1.  Relapsed IgG kappa multiple myeloma with high  risk features: - She is tolerating pomalidomide and dexamethasone very well. - Reviewed myeloma labs from 12/12/2022: M spike improved to 0.2 g from 0.3.  FLC ratio is 1.41.  Immunofixation shows IgG kappa. - She is diagnosed with colovaginal fistula and is planning to have surgery soon.  She may need colostomy. - She is having a CT scan done on 12/27/2022. - She may hold Pomalyst and dexamethasone 1 week prior and 2 weeks after surgery. - RTC 12 weeks for follow-up with repeat myeloma labs.   2.  Normocytic anemia: - Normocytic anemia from CKD and functional iron deficiency and myelosuppression.  Hemoglobin is stable at 9.7.  Will check ferritin and iron panel next time.   3.  Thromboprophylaxis: - Continue aspirin 81 mg daily.   4.  Neuropathy: - Continue Lyrica 75 mg twice daily.   5.  Lower extremity edema: - Continue Lasix 40 mg twice daily and metolazone 3 times weekly.   6.  Myeloma bone disease: - Zometa held after last infusion on 01/07/2020 due to worsening renal function.   7.  Hypokalemia: - Continue potassium 2 tablets daily at home.  Potassium is 3.3.   8.  Bilateral knee pains: - Continue Percocet twice daily as needed.    No orders of the defined types were placed in this encounter.     Alben Deeds Teague,acting as a Neurosurgeon for Doreatha Massed, MD.,have documented all relevant documentation on the behalf of Doreatha Massed, MD,as directed by  Doreatha Massed, MD while in the presence of Doreatha Massed, MD.  I, Doreatha Massed MD, have reviewed the above  documentation for accuracy and completeness, and I agree with the above.     Doreatha Massed, MD   11/14/20241:14 PM  CHIEF COMPLAINT:   Diagnosis: multiple myeloma    Cancer Staging  Multiple myeloma [facility]) Staging form: Multiple Myeloma, AJCC 6th Edition - Clinical stage from 10/26/2014: Stage IIA - Signed by Ellouise Newer, PA-C on 10/26/2014 - Pathologic: No stage assigned  - Unsigned    Prior Therapy: 1. Bortezomib x 8 cycles from 10/05/2014 to 04/06/2016. 2. Carfilzomib x 6 cycles from 03/05/2019 to 08/06/2019.  Current Therapy:  Pomalyst 4 mg 3/4 weeks    HISTORY OF PRESENT ILLNESS:   Oncology History  Multiple myeloma (HCC)  08/07/2014 Imaging   Bone Survey- No lytic lesions are noted in the visualized skeleton.   09/03/2014 Bone Marrow Biopsy   NORMOCELLULAR BONE MARROW FOR AGE WITH PLASMA CELL NEOPLASM.  The plasma cell component is increased in the marrow representing an estimated 18% of all cells. Cytogenetics with 13q-, 17p- (high risk disease)   09/03/2014 Pathology Results   Cytogenetics with 13q-, 17p- (high risk disease)   09/23/2014 Initial Diagnosis   Multiple myeloma   09/30/2014 PET scan   No abnormal hypermetabolism in the neck, chest, abdomen or pelvis.   10/05/2014 - 03/22/2015 Chemotherapy   RVD   10/28/2014 Treatment Plan Change   Issues related to getting Revlimid in a timely fashion, therefore, she received her Revlimid on 9/21 resulting in a 12 day cycle this time instead of a 14 day cycle   11/02/2014 Imaging   CTA chest- No evidence for a large or central pulmonary embolism as described.  8 mm density along the right minor fissure could represent focal pleural thickening but indeterminate. If the patient is at high risk for bronchogenic carcinoma, follow-up c   11/23/2014 Miscellaneous   Zometa 4 mg IV monthly   04/07/2015 Bone Marrow Biopsy   Normocellular marrow with 2-4% clonal plasma cells by immunohistochemistry. FISH and cytogenetics were normal [facility])     04/2015 Miscellaneous   PRETRANSPLANT EVALUATION:  Pulmonary function tests: FEV1 100.3% / DLCO 97.9%  Echocardiogram: Normal LV function with EF 60-65%    04/27/2015 Procedure   Stem cell mobilization with filgrastim and Mozobil [facility])   05/06/2015 Miscellaneous   BMT conditioning regimen with high-dose Melphalan given [facility], Zigmund Daniel); Day -1    05/07/2015 Bone Marrow Transplant   Outpatient autologous stem cell transplant [facility], Zigmund Daniel); Day 0   05/18/2015 Miscellaneous   WBC engraftment;  did not require platelet transfusion during her transplant process. Lowest platelet count 28,000    05/20/2015 Procedure   Tunneled catheter removed [facility])   09/08/2015 -  Chemotherapy   Velcade every 2 weeks   12/15/2015 Miscellaneous   Zometa re-instituted.    12/23/2015 Imaging   Bone density- BMD as determined from Forearm Radius 33% is 0.799 g/cm2 with a T-Score of 1.2. This patient is considered normal according to World Health Organization [facility]) criteria.   04/17/2016 Miscellaneous   Started maintenance with Ninlaro.   05/04/2016 Treatment Plan Change   Zometa switched to Xgeva injection monthly given difficult IV access.    03/05/2019 - 08/06/2019 Chemotherapy   The patient had carfilzomib (KYPROLIS) 40 mg in dextrose 5 % 50 mL chemo infusion, 18 mg/m2 = 44 mg, Intravenous,  Once, 6 of 6 cycles Dose modification: 56 mg/m2 (original dose 56 mg/m2, Cycle 1, Reason: Provider Judgment) Administration: 40 mg (03/05/2019), 120 mg (03/12/2019), 120 mg (03/20/2019), 120 mg (04/02/2019),  120 mg (04/09/2019), 120 mg (04/16/2019), 120 mg (04/30/2019), 120 mg (05/08/2019), 120 mg (05/14/2019), 120 mg (05/28/2019), 120 mg (06/04/2019), 120 mg (06/11/2019), 120 mg (06/25/2019), 120 mg (07/02/2019), 120 mg (07/09/2019), 120 mg (07/23/2019), 120 mg (07/30/2019), 120 mg (08/06/2019)  for chemotherapy treatment.       INTERVAL HISTORY:   Bellamia is a 67 y.o. female presenting to clinic today for follow up of multiple myeloma. She was last seen by me on 10/05/22.  Since her last visit, she was seen by Dr. Anne Hahn at [facility] on 11/07/22. She also saw Dr. Retta Diones on 11/08/22 and underwent cytoscopy at bedside.   Today, she states that she is doing well overall. Her appetite level is at 100%. Her energy level is at 70%.   PAST MEDICAL HISTORY:   Past  Medical History: Past Medical History:  Diagnosis Date   Anemia    Arthritis    Cancer (HCC)    Claustrophobia 10/05/2014   Hypertension    Leukopenia 08/03/2014   Normocytic hypochromic anemia 08/03/2014   Renal disorder    stage 3     Surgical History: Past Surgical History:  Procedure Laterality Date   ABDOMINAL HYSTERECTOMY     CARDIAC SURGERY     50 months old. States she had a leaky valve.    COLONOSCOPY  03/26/2009   Dr. Darrick Penna; normal colon, small internal hemorrhoids.  Recommended repeat colonoscopy in 10 years.   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"   PORTACATH PLACEMENT Left 02/21/2019   Procedure: INSERTION PORT-A-CATH;  Surgeon: Lucretia Roers, MD;  Location: [facility] ORS;  Service: General;  Laterality: Left;    Social History: Social History   Socioeconomic History   Marital status: Single    Spouse name: Not on file   Number of children: Not on file   Years of education: Not on file   Highest education level: Not on file  Occupational History   Not on file  Tobacco Use   Smoking status: Never   Smokeless tobacco: Never  Vaping Use   Vaping status: Never Used  Substance and Sexual Activity   Alcohol use: No   Drug use: No   Sexual activity: Not Currently    Birth control/protection: Surgical    Comment: divorced- 2 daughters; hyst  Other Topics Concern   Not on file  Social History Narrative   Not on file   Social Determinants of Health   Financial Resource Strain: Medium Risk (08/16/2022)   Overall Financial Resource Strain (CARDIA)    Difficulty of Paying Living Expenses: Somewhat hard  Food Insecurity: No Food  Insecurity (09/04/2022)   Hunger Vital Sign    Worried About Running Out of Food in the Last Year: Never true    Ran Out of Food in the Last Year: Never true  Transportation Needs: No Transportation Needs (09/04/2022)   PRAPARE - Administrator, Civil Service (Medical): No    Lack of Transportation (Non-Medical): No  Physical Activity: Insufficiently Active (08/16/2022)   Exercise Vital Sign    Days of Exercise per Week: 1 day  Minutes of Exercise per Session: 10 min  Stress: No Stress Concern Present (08/16/2022)   [employer] of Occupational Health - Occupational Stress Questionnaire    Feeling of Stress : Not at all  Social Connections: Moderately Isolated (08/16/2022)   Social Connection and Isolation Panel [NHANES]    Frequency of Communication with Friends and Family: More than three times a week    Frequency of Social Gatherings with Friends and Family: Once a week    Attends Religious Services: More than 4 times per year    Active Member of [employer] or Organizations: No    Attends Banker Meetings: Never    Marital Status: Divorced  Catering manager Violence: Not At Risk (09/04/2022)   Humiliation, Afraid, Rape, and Kick questionnaire    Fear of Current or Ex-Partner: No    Emotionally Abused: No    Physically Abused: No    Sexually Abused: No    Family History: Family History  Problem Relation Age of Onset   Cancer Maternal Grandmother    Cancer Father    Hypertension Father    Cancer Mother    Hypertension Mother    Hypertension Brother    Prostate cancer Brother    Hypertension Brother    Hypertension Brother    Hypertension Sister    Hypertension Sister    Hypertension Sister    Hypertension Sister    Colon cancer Neg Hx    Colon polyps Neg Hx     Current Medications:  Current Outpatient Medications:    acetaminophen (TYLENOL) 650 MG CR tablet, Take 650 mg by mouth every 8 (eight) hours as needed for pain., Disp: , Rfl:     albuterol (VENTOLIN HFA) 108 (90 Base) MCG/ACT inhaler, Inhale 2 puffs into the lungs every 6 (six) hours as needed for wheezing or shortness of breath., Disp: 8 g, Rfl: 3   apixaban (ELIQUIS) 5 MG TABS tablet, Take 1 tablet (5 mg total) by mouth 2 (two) times daily., Disp: 60 tablet, Rfl: 3   calcitRIOL (ROCALTROL) 0.25 MCG capsule, Take 0.25 mcg by mouth daily., Disp: , Rfl:    dexamethasone (DECADRON) 4 MG tablet, TAKE FIVE TABLETS BY MOUTH ONCE A WEEK, Disp: 30 tablet, Rfl: 6   furosemide (LASIX) 20 MG tablet, TAKE 2 TABLETS BY MOUTH DAILY IN THE MORNING AND TAKE 2 TABLETS AT BEDTIME (Patient taking differently: Take 20 mg by mouth as directed. 2 tablet in the morning and 2 tablets at bedtime on Mon., Wed., and Friday. 1 tablet in the morning and 1 tablet at bedtime on Sun., Tues., Thurs., and Sat.), Disp: 120 tablet, Rfl: 8   Infant Care Products (DERMACLOUD) OINT, Apply to affected area as needed Externally for 30 days, Disp: , Rfl:    lidocaine-prilocaine (EMLA) cream, APPLY 1 QUARTER SIZED AMOUNT 1 HOUR PRIOR TO TREATMENT, Disp: 30 g, Rfl: 10   magnesium oxide (MAG-OX) 400 (240 Mg) MG tablet, TAKE 1 TABLET BY MOUTH TWICE DAILY, Disp: 60 tablet, Rfl: 6   metolazone (ZAROXOLYN) 2.5 MG tablet, Take 1 tablet (2.5 mg total) by mouth daily. (Patient taking differently: Take 2.5 mg by mouth 3 (three) times a week. Mon., Wed., and Friday), Disp: 30 tablet, Rfl: 6   Multiple Vitamin (TAB-A-VITE) TABS, TAKE 1 TABLET BY MOUTH ONCE DAILY. (Patient taking differently: Take 1 tablet by mouth daily.), Disp: 30 tablet, Rfl: 11   oxyCODONE-acetaminophen (PERCOCET/ROXICET) 5-325 MG tablet, Take 1-2 tablets by mouth every 12 (twelve) hours as needed for severe  pain (pain score 7-10). TAKE 1 OR 2 TABLETS BY MOUTH EVERY TWELVE HOURS AS NEEDED for severe pain, Disp: 60 tablet, Rfl: 0   pomalidomide (POMALYST) 4 MG capsule, Take 1 capsule (4 mg total) by mouth daily. 21 days on, 7 days off, Disp: 21 capsule, Rfl: 0    potassium chloride SA (KLOR-CON M) 20 MEQ tablet, TAKE 1 TABLET BY MOUTH THREE TIMES DAILY (Patient taking differently: Take 20 mEq by mouth daily.), Disp: 90 tablet, Rfl: 10   pregabalin (LYRICA) 75 MG capsule, Take 1 capsule (75 mg total) by mouth 2 (two) times daily., Disp: 60 capsule, Rfl: 3   rosuvastatin (CRESTOR) 10 MG tablet, TAKE ONE TABLET BY MOUTH ONCE DAILY, Disp: 90 tablet, Rfl: 3   Semaglutide, 2 MG/DOSE, (OZEMPIC, 2 MG/DOSE,) 8 MG/3ML SOPN, Inject 2 mg into the skin once a week., Disp: 3 mL, Rfl: 11   trolamine salicylate (ASPERCREME) 10 % cream, Apply 1 application topically 2 (two) times daily as needed for muscle pain., Disp: , Rfl:    VITAMIN D PO, Take by mouth., Disp: , Rfl:  No current facility-administered medications for this visit.  Facility-Administered Medications Ordered in Other Visits:    heparin lock flush 100 unit/mL, 500 Units, Intravenous, Once, Penland, Carollee Herter K, MD   sodium chloride 0.9 % injection 10 mL, 10 mL, Intravenous, Once, Penland, Novella Olive, MD   Allergies: Allergies  Allergen Reactions   Diclofenac Swelling    Per pt, facial swelling  Other Reaction(s): Other (See Comments)  Per pt, facial swelling, Per pt, facial swelling    REVIEW OF SYSTEMS:   Review of Systems  Constitutional:  Negative for chills, fatigue and fever.  HENT:   Negative for lump/mass, mouth sores, nosebleeds, sore throat and trouble swallowing.   Eyes:  Negative for eye problems.  Respiratory:  Positive for shortness of breath. Negative for cough.   Cardiovascular:  Negative for chest pain, leg swelling and palpitations.  Gastrointestinal:  Negative for abdominal pain, constipation, diarrhea, nausea and vomiting.  Genitourinary:  Negative for bladder incontinence, difficulty urinating, dysuria, frequency, hematuria and nocturia.   Musculoskeletal:  Negative for arthralgias, back pain, flank pain, myalgias and neck pain.  Skin:  Negative for itching and rash.   Neurological:  Positive for numbness. Negative for dizziness and headaches.  Hematological:  Does not bruise/bleed easily.  Psychiatric/Behavioral:  Negative for depression, sleep disturbance and suicidal ideas. The patient is not nervous/anxious.   All other systems reviewed and are negative.    VITALS:   Blood pressure 139/80, pulse 73, temperature (!) 97.1 F (36.2 C), temperature source Tympanic, resp. rate 18, weight (!) 302 lb 1.6 oz (137 kg), SpO2 100%.  Wt Readings from Last 3 Encounters:  12/20/22 (!) 302 lb 1.6 oz (137 kg)  12/12/22 286 lb (129.7 kg)  11/24/22 283 lb (128.4 kg)    Body mass index is 55.25 kg/m.  Performance status (ECOG): 1 - Symptomatic but completely ambulatory  PHYSICAL EXAM:   Physical Exam Vitals and nursing note reviewed. Exam conducted with a chaperone present.  Constitutional:      Appearance: Normal appearance.  Cardiovascular:     Rate and Rhythm: Normal rate and regular rhythm.     Pulses: Normal pulses.     Heart sounds: Normal heart sounds.  Pulmonary:     Effort: Pulmonary effort is normal.     Breath sounds: Normal breath sounds.  Abdominal:     Palpations: Abdomen is soft. There is no hepatomegaly,  splenomegaly or mass.     Tenderness: There is no abdominal tenderness.  Musculoskeletal:     Right lower leg: No edema.     Left lower leg: No edema.  Lymphadenopathy:     Cervical: No cervical adenopathy.     Right cervical: No superficial, deep or posterior cervical adenopathy.    Left cervical: No superficial, deep or posterior cervical adenopathy.     Upper Body:     Right upper body: No supraclavicular or axillary adenopathy.     Left upper body: No supraclavicular or axillary adenopathy.  Neurological:     General: No focal deficit present.     Mental Status: She is alert and oriented to person, place, and time.  Psychiatric:        Mood and Affect: Mood normal.        Behavior: Behavior normal.     LABS:       Latest Ref Rng & Units 12/12/2022   10:22 AM 11/24/2022   12:23 PM 11/13/2022   11:30 AM  CBC  WBC 4.0 - 10.5 K/uL 6.9  3.7  7.5   Hemoglobin 12.0 - 15.0 g/dL 9.7  9.7  16.1   Hematocrit 36.0 - 46.0 % 30.4  30.3  31.8   Platelets 150 - 400 K/uL 228  206  252       Latest Ref Rng & Units 12/12/2022   10:22 AM 11/24/2022   12:23 PM 11/13/2022   11:30 AM  CMP  Glucose 70 - 99 mg/dL 096  045  409   BUN 8 - 23 mg/dL 34  47  44   Creatinine 0.44 - 1.00 mg/dL 8.11  9.14  7.82   Sodium 135 - 145 mmol/L 135  139  136   Potassium 3.5 - 5.1 mmol/L 3.3  3.2  3.3   Chloride 98 - 111 mmol/L 94  98  91   CO2 22 - 32 mmol/L 28  29  32   Calcium 8.9 - 10.3 mg/dL 9.7  9.3  9.7   Total Protein 6.5 - 8.1 g/dL 6.6     Total Bilirubin <1.2 mg/dL 0.7     Alkaline Phos 38 - 126 U/L 52     AST 15 - 41 U/L 16     ALT 0 - 44 U/L 15        No results found for: "CEA1", "CEA" / No results found for: "CEA1", "CEA" No results found for: "PSA1" No results found for: "NFA213" No results found for: "CAN125"  Lab Results  Component Value Date   TOTALPROTELP 6.5 12/12/2022   TOTALPROTELP 6.0 12/12/2022   ALBUMINELP 3.6 12/12/2022   A1GS 0.3 12/12/2022   A2GS 0.7 12/12/2022   BETS 0.9 12/12/2022   GAMS 0.6 12/12/2022   MSPIKE 0.2 (H) 12/12/2022   SPEI Comment 12/12/2022   Lab Results  Component Value Date   TIBC 277 09/28/2022   TIBC 265 06/21/2022   TIBC 204 (L) 02/15/2022   FERRITIN 404 (H) 09/28/2022   FERRITIN 310 (H) 06/21/2022   FERRITIN 1,004 (H) 02/15/2022   IRONPCTSAT 32 (H) 09/28/2022   IRONPCTSAT 36 (H) 06/21/2022   IRONPCTSAT 50 (H) 02/15/2022   Lab Results  Component Value Date   LDH 136 03/22/2022   LDH 134 12/13/2021   LDH 168 09/14/2021     STUDIES:   IR URETERAL STENT PLACEMENT EXISTING ACCESS LEFT  Result Date: 11/25/2022 INDICATION: 67 year old female with 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 Request for internalization. EXAM: 1. Left nephrostogram. 2. Placement of left double-J nephroureteral stent. 3. Nephrostomy tube exchange. COMPARISON:  09/08/2022 MEDICATIONS: Rocephin 2 gm IV; The antibiotic was administered in an appropriate time frame prior to skin puncture. ANESTHESIA/SEDATION: Moderate (conscious) sedation was employed during this procedure. A total of Versed 2 mg and Fentanyl 100 mcg was administered intravenously by the radiology nurse. Total intra-service moderate Sedation Time: 20 minutes. The patient's level of consciousness and vital signs were monitored continuously by radiology nursing throughout the procedure under my direct supervision. CONTRAST:  20 mL Omnipaque 300-administered into the collecting system(s) FLUOROSCOPY: Radiation Exposure Index (as provided by the fluoroscopic device): 181 mGy Kerma COMPLICATIONS: None immediate. PROCEDURE: Informed written consent was obtained from the patient after a thorough discussion of the procedural risks, benefits and alternatives. All questions were addressed. Maximal Sterile Barrier Technique was utilized including caps, mask, sterile gowns, sterile gloves, sterile drape, hand hygiene and skin antiseptic. A timeout was performed prior to the initiation of the procedure. The left flank and indwelling nephrostomy tube recur prepped and draped in standard fashion. Contrast injection via the indwelling nephrostomy demonstrated partial retraction with duplicated left collecting system and single nondilated ureter which do not opacify beyond the pelvic inlet. The external portion of the catheter was cut to release the inter pigtail. An Amplatz wire was inserted and the catheter was removed. Over the wire, a 5 French angled tip catheter was inserted and directed into the left ureter. The catheter and wire unable to be directed past the level of the pelvic inlet. The wire was exchanged for a Glidewire which was then able to be  passed into the urinary bladder. Contrast injection via the catheter demonstrated intraluminal position within the urinary bladder. Measurement from the bladder to the renal pelvis was performed with a Glidewire. The Amplatz wire was reinserted in the 5 French catheter was exchanged for an 8 [place], 22 cm double-J nephroureteral stent which was deployed without difficulty. While preserving access to the renal pelvis, a new, 10.2 French percutaneous nephrostomy tube was placed. Contrast injection demonstrated appropriate position and patency of the indwelling stent. The nephrostomy tube was capped and secured at the skin entry site with a 0 silk interrupted suture. Sterile bandage was applied. The patient tolerated procedure well was transferred to the recovery area in good condition. IMPRESSION: 1. Technically successful placement of left double-J nephroureteral stent. 2. Technically successful exchange of left 10 French nephrostomy tube, capped. PLAN: Return in 2 weeks for nephrostomy tube check and possible removal if capping trial is tolerated. Marliss Coots, MD [facility] Electronically Signed   By: Marliss Coots M.D.   On: 11/25/2022 06:36

## 2022-12-21 ENCOUNTER — Inpatient Hospital Stay: Payer: 59

## 2022-12-21 ENCOUNTER — Encounter (HOSPITAL_COMMUNITY): Payer: Self-pay | Admitting: Hematology

## 2022-12-27 ENCOUNTER — Ambulatory Visit (HOSPITAL_COMMUNITY)
Admission: RE | Admit: 2022-12-27 | Discharge: 2022-12-27 | Disposition: A | Discharge status: Home / Self Care | Payer: 59 | Source: Ambulatory Visit | Attending: Family Medicine | Admitting: Family Medicine

## 2022-12-27 ENCOUNTER — Encounter (HOSPITAL_COMMUNITY): Payer: Self-pay

## 2022-12-27 ENCOUNTER — Ambulatory Visit (HOSPITAL_COMMUNITY)
Admission: RE | Admit: 2022-12-27 | Discharge: 2022-12-27 | Disposition: A | Discharge status: Home / Self Care | Payer: 59 | Source: Ambulatory Visit | Attending: General Surgery | Admitting: General Surgery

## 2022-12-27 DIAGNOSIS — Z1231 Encounter for screening mammogram for malignant neoplasm of breast: Secondary | ICD-10-CM | POA: Insufficient documentation

## 2022-12-27 DIAGNOSIS — N823 Fistula of vagina to large intestine: Secondary | ICD-10-CM | POA: Insufficient documentation

## 2022-12-28 ENCOUNTER — Inpatient Hospital Stay
Admission: RE | Admit: 2022-12-28 | Discharge: 2022-12-28 | Disposition: A | Discharge status: Home / Self Care | Payer: Self-pay | Source: Ambulatory Visit | Attending: Family Medicine | Admitting: Family Medicine

## 2022-12-28 ENCOUNTER — Other Ambulatory Visit (HOSPITAL_COMMUNITY): Payer: Self-pay | Admitting: Family Medicine

## 2022-12-28 ENCOUNTER — Telehealth: Payer: Self-pay | Admitting: *Deleted

## 2022-12-28 ENCOUNTER — Inpatient Hospital Stay: Payer: 59 | Admitting: Hematology

## 2022-12-28 DIAGNOSIS — Z1231 Encounter for screening mammogram for malignant neoplasm of breast: Secondary | ICD-10-CM

## 2022-12-28 NOTE — Telephone Encounter (Signed)
   Pre-operative Risk Assessment    Patient Name: Kelli Hurley  DOB: [birth year] MRN: [id number]  DATE OF THE LAST VISIT: 09/26/22 SHERI HAMMOCK, NP DATE OF NEXT VISIT: 01/02/23 DR. MALLIPEDDI    Request for Surgical Clearance    Procedure:   END COLOSTOMY CREATION  Date of Surgery:  Clearance TBD                                 Surgeon:  DR. Algis Greenhouse Surgeon's Group or Practice Name:  [facility] Phone number:  [phone] Fax number:  [phone]   Type of Clearance Requested:   - Medical  - Pharmacy:  Hold Apixaban (Eliquis) x 5 DAYS PRIOR   Type of Anesthesia:  General    Additional requests/questions:    Shalette, Deeken   12/28/2022, 5:14 PM

## 2023-01-01 NOTE — Telephone Encounter (Signed)
Patient with diagnosis of Atrial flutter (in setting of sepsis 09/03/22) on Eliquis (apixaban) 5 mg PO BID for anticoagulation.    Procedure: end colostomy creation Date of procedure: TBD   CHA2DS2-VASc Score = 5   This indicates a 7.2% annual risk of stroke. The patient's score is based upon: CHF History: 1 HTN History: 1 Diabetes History: 1 Stroke History: 0 Vascular Disease History: 0 Age Score: 1 Gender Score: 1      CrCl 62.3 (actual BW), 39 mL/min (adjBW) Platelet count 228  Pharmacokinetically there is no reason to hold Eliquis for more than 3 days. Patient may hold for 3 days, if MD desires longer hold will need cardiology approval.   **This guidance is not considered finalized until pre-operative APP has relayed final recommendations.**  Nils Pyle, PharmD PGY1 Pharmacy Resident

## 2023-01-02 ENCOUNTER — Ambulatory Visit: Payer: 59 | Admitting: General Surgery

## 2023-01-02 ENCOUNTER — Encounter: Payer: Self-pay | Admitting: Internal Medicine

## 2023-01-02 ENCOUNTER — Ambulatory Visit: Payer: 59 | Attending: Internal Medicine | Admitting: Internal Medicine

## 2023-01-02 VITALS — BP 116/66 | HR 76 | Ht 62.0 in | Wt 295.0 lb

## 2023-01-02 VITALS — BP 123/78 | HR 78 | Temp 97.9°F | Resp 14 | Ht 62.0 in | Wt 295.0 lb

## 2023-01-02 DIAGNOSIS — Z01818 Encounter for other preprocedural examination: Secondary | ICD-10-CM | POA: Diagnosis not present

## 2023-01-02 DIAGNOSIS — I4892 Unspecified atrial flutter: Secondary | ICD-10-CM | POA: Diagnosis not present

## 2023-01-02 DIAGNOSIS — N824 Other female intestinal-genital tract fistulae: Secondary | ICD-10-CM | POA: Diagnosis not present

## 2023-01-02 MED ORDER — NEOMYCIN SULFATE 500 MG PO TABS
1000.0000 mg | ORAL_TABLET | ORAL | 0 refills | Status: DC
Start: 2023-01-02 — End: 2023-01-27

## 2023-01-02 MED ORDER — METRONIDAZOLE 500 MG PO TABS
1000.0000 mg | ORAL_TABLET | ORAL | 0 refills | Status: DC
Start: 2023-01-02 — End: 2023-01-27

## 2023-01-02 NOTE — Patient Instructions (Addendum)
Colon Preparation:  Buy from the Store: Miralax bottle [phone]).  Gatorade 64 oz (not red). Dulcolax tablets.   The Day Prior to Surgery: Take 4 ducolax tablets at 7am with water. Drink plenty of clear liquids all day to avoid dehydration, no solid food.    Mix the bottle of Miralax and 64 oz of Gatorade and drink this mixture starting at 10am. Drink it gradually over the next few hours, 8 ounces every 15-30 minutes until it is gone. Finish this by 2pm.  Take 2 neomycin 500mg  tablets and 2 metronidazole 500mg  tablets at 2 pm. Take 2 neomycin 500mg  tablets and 2 metronidazole 500mg  tablets at 3pm. Take 2 neomycin 500mg  tablets and 2 metronidazole 500mg  tablets at 10pm.    Do not eat or drink anything after midnight the night before your surgery.  Do not eat or drink anything that morning, and take medications as instructed by the hospital staff on your preoperative visit.   Hold your Medications as follows: Pomalidomide hold 1 week (7 days) prior to surgery, will resume ~2 weeks after surgery (Dr. Ellin Saba) Dexamethasone hold 1 week prior to surgery, will resume ~2 weeks after surgery (Dr. Ellin Saba) Ozempic hold 1 week prior to surgery Eliquis hold 3 days prior to surgery (Cardiology)

## 2023-01-02 NOTE — Progress Notes (Unsigned)
[facility] History and Physical  Reason for Referral:*** Referring Physician: ***  Chief Complaint   Follow-up     Kelli Hurley is a 67 y.o. female.  HPI: ***.  The *** started *** and has had a duration of ***.  It is associated with ***.  The *** is improved with ***, and is made worse with ***.    Quality*** Context***  Past Medical History:  Diagnosis Date   Anemia    Arthritis    Cancer (HCC)    Claustrophobia 10/05/2014   Hypertension    Leukopenia 08/03/2014   Normocytic hypochromic anemia 08/03/2014   Renal disorder    stage 3     Past Surgical History:  Procedure Laterality Date   ABDOMINAL HYSTERECTOMY     CARDIAC SURGERY     49 months old. States she had a leaky valve.    COLONOSCOPY  03/26/2009   Dr. Darrick Penna; normal colon, small internal hemorrhoids.  Recommended repeat colonoscopy in 10 years.   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"   PORTACATH PLACEMENT Left 02/21/2019   Procedure: INSERTION PORT-A-CATH;  Surgeon: Lucretia Roers, MD;  Location: [facility];  Service: General;  Laterality: Left;    Family History  Problem Relation Age of Onset   Cancer Mother    Hypertension Mother    Cancer Father    Hypertension Father    Hypertension Sister    Hypertension Sister    Hypertension Sister    Hypertension Sister    Cancer Maternal Grandmother    Cancer Cousin    Hypertension Brother    Prostate cancer Brother    Hypertension Brother    Hypertension Brother    Colon cancer Neg Hx    Colon polyps Neg Hx     Social History   Tobacco Use   Smoking status: Never   Smokeless tobacco: Never   Vaping Use   Vaping status: Never Used  Substance Use Topics   Alcohol use: No   Drug use: No    Medications: {medication reviewed/display:3041432} Allergies as of 01/02/2023       Reactions   Diclofenac Swelling   Per pt, facial swelling Other Reaction(s): Other (See Comments) Per pt, facial swelling, Per pt, facial swelling        Medication List        Accurate as of January 02, 2023 12:49 PM. If you have any questions, ask your nurse or doctor.          acetaminophen 650 MG CR tablet Commonly known as: TYLENOL Take 650 mg by mouth every 8 (eight) hours as needed for pain.   albuterol 108 (90 Base) MCG/ACT inhaler Commonly known as: VENTOLIN HFA Inhale 2 puffs into the lungs every 6 (six) hours as needed for wheezing or shortness of breath.   apixaban 5 MG Tabs tablet Commonly known as: ELIQUIS Take 1 tablet (5 mg total) by mouth 2 (two) times daily.   calcitRIOL 0.25 MCG capsule Commonly known as: ROCALTROL Take 0.25 mcg by mouth daily.   Dermacloud Oint Apply to affected area as needed Externally for 30 days  dexamethasone 4 MG tablet Commonly known as: DECADRON TAKE FIVE TABLETS BY MOUTH ONCE A WEEK   furosemide 20 MG tablet Commonly known as: LASIX Take 40 mg by mouth 2 (two) times daily. What changed: Another medication with the same name was removed. Continue taking this medication, and follow the directions you see here. Changed by: Vishnu P Mallipeddi   lidocaine-prilocaine cream Commonly known as: EMLA APPLY 1 QUARTER SIZED AMOUNT 1 HOUR PRIOR TO TREATMENT   magnesium oxide 400 (240 Mg) MG tablet Commonly known as: MAG-OX TAKE 1 TABLET BY MOUTH TWICE DAILY   metolazone 2.5 MG tablet Commonly known as: ZAROXOLYN Take 1 tablet (2.5 mg total) by mouth daily. What changed:  when to take this additional instructions   oxyCODONE-acetaminophen 5-325 MG tablet Commonly known as: PERCOCET/ROXICET Take 1-2 tablets by mouth every 12  (twelve) hours as needed for severe pain (pain score 7-10). TAKE 1 OR 2 TABLETS BY MOUTH EVERY TWELVE HOURS AS NEEDED for severe pain   Ozempic (2 MG/DOSE) 8 MG/3ML Sopn Generic drug: Semaglutide (2 MG/DOSE) Inject 2 mg into the skin once a week.   pomalidomide 4 MG capsule Commonly known as: POMALYST Take 1 capsule (4 mg total) by mouth daily. 21 days on, 7 days off   potassium chloride SA 20 MEQ tablet Commonly known as: KLOR-CON M TAKE 1 TABLET BY MOUTH THREE TIMES DAILY What changed:  how much to take how to take this when to take this additional instructions   pregabalin 75 MG capsule Commonly known as: Lyrica Take 1 capsule (75 mg total) by mouth 2 (two) times daily.   rosuvastatin 10 MG tablet Commonly known as: CRESTOR TAKE ONE TABLET BY MOUTH ONCE DAILY   Tab-A-Vite Tabs TAKE 1 TABLET BY MOUTH ONCE DAILY.   trolamine salicylate 10 % cream Commonly known as: ASPERCREME Apply 1 application topically 2 (two) times daily as needed for muscle pain.   VITAMIN D PO Take by mouth.         ROS:  {Review of Systems:30496}  Blood pressure 123/78, pulse 78, temperature 97.9 F (36.6 C), temperature source Oral, resp. rate 14, height 5\' 2"  (1.575 m), weight 295 lb (133.8 kg), SpO2 92%. Physical Exam  Results: No results found for this or any previous visit (from the past 48 hour(s)).  No results found.   Assessment & Plan:  Kelli Hurley is a 67 y.o. female with *** -*** -*** -Follow up ***  All questions were answered to the satisfaction of the patient and family***.  The risk and benefits of *** were discussed including but not limited to ***.  After careful consideration, Kelli Hurley has decided to ***.    Lucretia Roers 01/02/2023, 12:49 PM

## 2023-01-02 NOTE — Telephone Encounter (Signed)
   Name: Kelli Hurley  DOB: 1955/07/21  MRN: [id number]  Primary Cardiologist: Marjo Bicker, MD  Chart reviewed as part of pre-operative protocol coverage. Because of Kelli Hurley's past medical history and time since last visit, she will require a follow-up in-office visit in order to better assess preoperative cardiovascular risk.  Pre-op covering staff: - Please schedule appointment and call patient to inform them. If patient already had an upcoming appointment within acceptable timeframe, please add "pre-op clearance" to the appointment notes so provider is aware. - Please contact requesting surgeon's office via preferred method (i.e, phone, fax) to inform them of need for appointment prior to surgery.  Pharmacokinetically there is no reason to hold Eliquis for more than 3 days. Patient may hold for 3 days, if MD desires longer hold will need cardiology approval.     Sharlene Dory, PA-C  01/02/2023, 7:41 AM

## 2023-01-02 NOTE — Progress Notes (Signed)
Cardiology Office Note  Date: 01/02/2023   ID: Kelli Hurley, DOB 01/17/1956, MRN [id number]  PCP:  Joana Reamer, DO  Cardiologist:  Marjo Bicker, MD Electrophysiologist:  None   History of Present Illness: Kelli Hurley is a 67 y.o. female known to have paroxysmal atrial flutter diagnosed in 08/2022, HTN, OSA on CPAP, multiple myeloma a in remission with no evidence of cardiac amyloidosis per cardiac MRI in 2021, CKD stage IIIb presented to cardiology clinic for preop cardiac risk stratification.  Patient was initially diagnosed with multiple myeloma for which she underwent cardiac MRI in 2021 that showed no evidence of cardiac amyloidosis, normal LV size and function EF 56%, no hyperenhancement of LV myocardium, normal RV size and function. Echocardiogram from 2022 showed normal LVEF, indeterminate LV diastology and no valve abnormalities.   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  She continued amiodarone for a few days after discharge and stopped.  She was also discharged on Eliquis 5 mg twice daily which she has been compliant with.  She had obstructive uropathy during the hospitalization and is s/p percutaneous nephrostomy tube.  She presents today for follow-up visit, accompanied by family.  Has stable SOB, no worsening.  On Ozempic.  No chest pain, palpitations, dizziness, syncope or leg swelling.  Past Medical History:  Diagnosis Date   Anemia    Arthritis    Cancer (HCC)    Claustrophobia 10/05/2014   Hypertension    Leukopenia 08/03/2014   Normocytic hypochromic anemia 08/03/2014   Renal disorder    stage 3     Past Surgical History:  Procedure Laterality Date   ABDOMINAL HYSTERECTOMY     CARDIAC SURGERY     64 months old. States she had a  leaky valve.    COLONOSCOPY  03/26/2009   Dr. Darrick Penna; normal colon, small internal hemorrhoids.  Recommended repeat colonoscopy in 10 years.   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"   PORTACATH PLACEMENT Left 02/21/2019   Procedure: INSERTION PORT-A-CATH;  Surgeon: Lucretia Roers, MD;  Location: [facility];  Service: General;  Laterality: Left;    Current Outpatient Medications  Medication Sig Dispense Refill   acetaminophen (TYLENOL) 650 MG CR tablet Take 650 mg by mouth every 8 (eight) hours as needed for pain.     albuterol (VENTOLIN HFA) 108 (90 Base) MCG/ACT inhaler Inhale 2 puffs into the lungs every 6 (six) hours as needed for wheezing or shortness of breath. 8 g 3   apixaban (ELIQUIS) 5 MG TABS tablet Take 1 tablet (5 mg total) by mouth 2 (two) times daily. 60 tablet 3   calcitRIOL (ROCALTROL) 0.25 MCG capsule Take 0.25 mcg by mouth daily.     dexamethasone (DECADRON) 4 MG tablet TAKE FIVE TABLETS BY MOUTH ONCE A WEEK 30 tablet 6   furosemide (LASIX) 20 MG tablet Take 40 mg by mouth 2 (two) times daily.     Infant Care Products [facility]) OINT  Apply to affected area as needed Externally for 30 days     lidocaine-prilocaine (EMLA) cream APPLY 1 QUARTER SIZED AMOUNT 1 HOUR PRIOR TO TREATMENT 30 g 10   magnesium oxide (MAG-OX) 400 (240 Mg) MG tablet TAKE 1 TABLET BY MOUTH TWICE DAILY 60 tablet 6   metolazone (ZAROXOLYN) 2.5 MG tablet Take 1 tablet (2.5 mg total) by mouth daily. (Patient taking differently: Take 2.5 mg by mouth 3 (three) times a week. Mon., Wed., and Friday) 30 tablet 6   Multiple Vitamin (TAB-A-VITE) TABS TAKE 1 TABLET BY MOUTH ONCE DAILY.  (Patient taking differently: Take 1 tablet by mouth daily.) 30 tablet 11   oxyCODONE-acetaminophen (PERCOCET/ROXICET) 5-325 MG tablet Take 1-2 tablets by mouth every 12 (twelve) hours as needed for severe pain (pain score 7-10). TAKE 1 OR 2 TABLETS BY MOUTH EVERY TWELVE HOURS AS NEEDED for severe pain 60 tablet 0   pomalidomide (POMALYST) 4 MG capsule Take 1 capsule (4 mg total) by mouth daily. 21 days on, 7 days off 21 capsule 0   potassium chloride SA (KLOR-CON M) 20 MEQ tablet TAKE 1 TABLET BY MOUTH THREE TIMES DAILY (Patient taking differently: Take 40 mEq by mouth daily.) 90 tablet 10   pregabalin (LYRICA) 75 MG capsule Take 1 capsule (75 mg total) by mouth 2 (two) times daily. 60 capsule 3   rosuvastatin (CRESTOR) 10 MG tablet TAKE ONE TABLET BY MOUTH ONCE DAILY 90 tablet 3   Semaglutide, 2 MG/DOSE, (OZEMPIC, 2 MG/DOSE,) 8 MG/3ML SOPN Inject 2 mg into the skin once a week. 3 mL 11   trolamine salicylate (ASPERCREME) 10 % cream Apply 1 application topically 2 (two) times daily as needed for muscle pain.     VITAMIN D PO Take by mouth.     No current facility-administered medications for this visit.   Facility-Administered Medications Ordered in Other Visits  Medication Dose Route Frequency Provider Last Rate Last Admin   heparin lock flush 100 unit/mL  500 Units Intravenous Once Penland, Novella Olive, MD       sodium chloride 0.9 % injection 10 mL  10 mL Intravenous Once Penland, Novella Olive, MD       Allergies:  Diclofenac   Social History: The patient  reports that she has never smoked. She has never used smokeless tobacco. She reports that she does not drink alcohol and does not use drugs.   Family History: The patient's family history includes Cancer in her cousin, father, maternal grandmother, and mother; Hypertension in her brother, brother, brother, father, mother, sister, sister, sister, and sister; Prostate cancer in her brother.   ROS:  Please see the history of present illness.  Otherwise, complete review of systems is positive for none.  All other systems are reviewed and negative.   Physical Exam: VS:  BP 116/66   Pulse 76   Ht 5\' 2"  (1.575 m)   Wt 295 lb (133.8 kg)   SpO2 96%   BMI 53.96 kg/m , BMI Body mass index is 53.96 kg/m.  Wt Readings from Last 3 Encounters:  01/02/23 295 lb (133.8 kg)  12/20/22 (!) 302 lb 1.6 oz (137 kg)  12/12/22 286 lb (129.7 kg)    General: Patient appears comfortable at rest. HEENT: Conjunctiva and lids normal, oropharynx clear with moist mucosa. Neck: Supple, no elevated JVP or carotid bruits, no thyromegaly. Lungs: Clear to auscultation, nonlabored breathing at rest. Cardiac: Regular rate and rhythm, no S3 or significant systolic murmur, no pericardial rub. Abdomen: Soft, nontender,  no hepatomegaly, bowel sounds present, no guarding or rebound. Extremities: No pitting edema, distal pulses 2+. Skin: Warm and dry. Musculoskeletal: No kyphosis. Neuropsychiatric: Alert and oriented x3, affect grossly appropriate.  ECG:  NSR  Recent Labwork: 09/28/2022: TSH 1.725 12/12/2022: ALT 15; AST 16; BUN 34; Creatinine, Ser 1.92; Hemoglobin 9.7; Magnesium 2.0; Platelets 228; Potassium 3.3; Sodium 135  No results found for: "CHOL", "TRIG", "HDL", "CHOLHDL", "VLDL", "LDLCALC", "LDLDIRECT"   Assessment and Plan:   Preop cardiac risk stratification for left colovaginal fistula repair Paroxysmal atrial flutter OSA on CPAP Morbid obesity on Ozempic Multiple myeloma in remission SOB, multifactorial from morbid obesity, deconditioning and possible HFpEF (No recent worsening)   -METS less than 4 due to bilateral knee arthritis. However specificity of stress testing will be low due to morbid obesity. She clearly denies any recent worsening of DOE and clearly denies any angina.  EKG today showed NSR, no ischemia.  No prior CAD. She is at a low risk for any perioperative cardiac complications.  No prior history of CVA.  Currently on  Eliquis 5 mg twice daily which can be held for 5 days prior to the surgery and can be resumed when safe from a bleeding standpoint. -For paroxysmal atrial flutter, EKG today showed NSR and first-degree AV block, not on any rate controlling agents, will continue Eliquis 5 mg twice daily, preop recommendations as above. -Continue CPAP for OSA management. -Cardiac MRI did not show evidence of cardiac amyloidosis in 2021.  I spent a total duration of 30 minutes during the prior notes, echo reports, discharge summary notes, discussed preop recommendations with the patient and discussed about atrial flutter.   Medication Adjustments/Labs and Tests Ordered: Current medicines are reviewed at length with the patient today.  Concerns regarding medicines are outlined above.   Tests Ordered: No orders of the defined types were placed in this encounter.   Medication Changes: No orders of the defined types were placed in this encounter.   Disposition:  Follow up  1 year  Signed, Chelsia Serres Verne Spurr, MD, 01/02/2023 10:33 AM    [facility] [address] [place], [place] [address]

## 2023-01-02 NOTE — Telephone Encounter (Signed)
   Patient Name: Kelli Hurley  DOB: 01/30/1956 MRN: [id number]  Primary Cardiologist: Marjo Bicker, MD  Chart reviewed as part of pre-operative protocol coverage. Pre-op clearance already addressed by colleagues in earlier phone notes. To summarize recommendations:  -Specificity of stress test will be extremely low due to morbid obesity. Will defer. No chest pains, low to moderate risk for the surgery. No prior Hx of stroke. Okay to hold Eliquis for 5 days prior to the surgery and can resume if no CI.  -Dr. Jenene Slicker   Will route this bundled recommendation to requesting provider via Epic fax function and remove from pre-op pool. Please call with questions.  Sharlene Dory, PA-C 01/02/2023, 1:18 PM

## 2023-01-02 NOTE — Patient Instructions (Signed)

## 2023-01-02 NOTE — Telephone Encounter (Signed)
Pt has appt today with Dr. Jenene Slicker. I will forward notes to MD for pre op clearance.

## 2023-01-08 ENCOUNTER — Encounter: Payer: Self-pay | Admitting: *Deleted

## 2023-01-08 ENCOUNTER — Telehealth: Payer: Self-pay | Admitting: Pulmonary Disease

## 2023-01-08 DIAGNOSIS — G4733 Obstructive sleep apnea (adult) (pediatric): Secondary | ICD-10-CM

## 2023-01-08 NOTE — Addendum Note (Signed)
Addended by: Phillips Odor on: 01/08/2023 03:03 PM   Modules accepted: Orders

## 2023-01-09 NOTE — H&P (Signed)
[facility] History and Physical     Chief Complaint   Follow-up        Kelli Hurley is a 67 y.o. female.  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 She had a repeat CT scan that showed the fistula with tethering of the left ovary and ureter. The ureter stent has been internalized. Given the ureter involvement and need for the stents due to her hydroureter, it is unlikely she will be able to ever be managed conservatively for this fistula. The patient also does not want to live with the vaginal drainage.    She has been cleared by Cardiology and I have discussed her Multiple Myeloma medications with Dr. Ellin Saba. She is ready to move forward with colectomy. She is here today with her family.        Past Medical History:  Diagnosis Date   Anemia     Arthritis     Cancer (HCC)     Claustrophobia 10/05/2014   Hypertension     Leukopenia 08/03/2014   Normocytic hypochromic anemia 08/03/2014   Renal disorder      stage 3                Past Surgical History:  Procedure Laterality Date   ABDOMINAL HYSTERECTOMY       CARDIAC SURGERY        35 months old. States she had a leaky valve.    COLONOSCOPY   03/26/2009    Dr. Darrick Penna; normal colon, small internal hemorrhoids.  Recommended repeat colonoscopy in 10 years.   COLONOSCOPY WITH PROPOFOL N/A 11/25/2019    Procedure: COLONOSCOPY WITH PROPOFOL;  Surgeon: Lanelle Bal, DO;  Location: [facility];  Service: Endoscopy;  Laterality: N/A;  10:45am   IR PATIENT EVAL TECH 0-60 MINS   09/08/2022   IR URETERAL STENT PLACEMENT EXISTING ACCESS LEFT   11/24/2022   OTHER SURGICAL HISTORY        heart surgery as infant to "repair hole in heart"   PORTACATH PLACEMENT Left 02/21/2019    Procedure: INSERTION PORT-A-CATH;  Surgeon: Lucretia Roers, MD;  Location: [facility];  Service: General;  Laterality: Left;               Family History  Problem Relation Age of Onset    Cancer Mother     Hypertension Mother     Cancer Father     Hypertension Father     Hypertension Sister     Hypertension Sister     Hypertension Sister     Hypertension Sister     Cancer Maternal Grandmother     Cancer Cousin     Hypertension Brother     Prostate cancer Brother     Hypertension Brother     Hypertension Brother     Colon cancer Neg Hx     Colon polyps Neg Hx            Social History  Social History        Tobacco Use   Smoking status: Never   Smokeless tobacco: Never  Vaping Use   Vaping status: Never Used  Substance Use Topics   Alcohol use: No   Drug use: No        Medications: I have reviewed the patient's current medications. Allergies as of 01/02/2023         Reactions    Diclofenac Swelling    Per pt, facial swelling  Other Reaction(s): Other (See Comments) Per pt, facial swelling, Per pt, facial swelling            Medication List           Accurate as of January 02, 2023 11:59 PM. If you have any questions, ask your nurse or doctor.              acetaminophen 650 MG CR tablet Commonly known as: TYLENOL Take 650 mg by mouth every 8 (eight) hours as needed for pain.    albuterol 108 (90 Base) MCG/ACT inhaler Commonly known as: VENTOLIN HFA Inhale 2 puffs into the lungs every 6 (six) hours as needed for wheezing or shortness of breath.    apixaban 5 MG Tabs tablet Commonly known as: ELIQUIS Take 1 tablet (5 mg total) by mouth 2 (two) times daily.    calcitRIOL 0.25 MCG capsule Commonly known as: ROCALTROL Take 0.25 mcg by mouth daily.    Dermacloud Oint Apply to affected area as needed Externally for 30 days    dexamethasone 4 MG tablet Commonly known as: DECADRON TAKE FIVE TABLETS BY MOUTH ONCE A WEEK    furosemide 20 MG tablet Commonly known as: LASIX Take 40 mg by mouth 2 (two) times daily. What changed: Another medication with the same name was removed. Continue taking this medication, and follow the  directions you see here. Changed by: Vishnu P Mallipeddi    lidocaine-prilocaine cream Commonly known as: EMLA APPLY 1 QUARTER SIZED AMOUNT 1 HOUR PRIOR TO TREATMENT    magnesium oxide 400 (240 Mg) MG tablet Commonly known as: MAG-OX TAKE 1 TABLET BY MOUTH TWICE DAILY    metolazone 2.5 MG tablet Commonly known as: ZAROXOLYN Take 1 tablet (2.5 mg total) by mouth daily. What changed:  when to take this additional instructions    metroNIDAZOLE 500 MG tablet Commonly known as: Flagyl Take 2 tablets (1,000 mg total) by mouth as directed. Take 2 flagyl 500mg  tablets at 2 pm, 3pm, and 10 pm the day before surgery. Started by: Lucretia Roers    neomycin 500 MG tablet Commonly known as: MYCIFRADIN Take 2 tablets (1,000 mg total) by mouth as directed. Take 2 neomycin 500mg  tablets at 2 pm, 3pm, and 10 pm the day before surgery. Started by: Lucretia Roers    oxyCODONE-acetaminophen 5-325 MG tablet Commonly known as: PERCOCET/ROXICET Take 1-2 tablets by mouth every 12 (twelve) hours as needed for severe pain (pain score 7-10). TAKE 1 OR 2 TABLETS BY MOUTH EVERY TWELVE HOURS AS NEEDED for severe pain    Ozempic (2 MG/DOSE) 8 MG/3ML Sopn Generic drug: Semaglutide (2 MG/DOSE) Inject 2 mg into the skin once a week.    pomalidomide 4 MG capsule Commonly known as: POMALYST Take 1 capsule (4 mg total) by mouth daily. 21 days on, 7 days off    potassium chloride SA 20 MEQ tablet Commonly known as: KLOR-CON M TAKE 1 TABLET BY MOUTH THREE TIMES DAILY What changed:  how much to take how to take this when to take this additional instructions    pregabalin 75 MG capsule Commonly known as: Lyrica Take 1 capsule (75 mg total) by mouth 2 (two) times daily.    rosuvastatin 10 MG tablet Commonly known as: CRESTOR TAKE ONE TABLET BY MOUTH ONCE DAILY    Tab-A-Vite Tabs TAKE 1 TABLET BY MOUTH ONCE DAILY.    trolamine salicylate 10 % cream Commonly known as: ASPERCREME Apply 1  application topically 2 (two) times  daily as needed for muscle pain.    VITAMIN D PO Take by mouth.               ROS:  A comprehensive review of systems was negative except for: Gastrointestinal: positive for vaginal leakage/drainage of stool    Blood pressure 123/78, pulse 78, temperature 97.9 F (36.6 C), temperature source Oral, resp. rate 14, height 5\' 2"  (1.575 m), weight 295 lb (133.8 kg), SpO2 92%. Physical Exam Vitals reviewed.  Constitutional:      Appearance: She is obese.  HENT:     Head: Normocephalic.     Nose: Nose normal.  Eyes:     Extraocular Movements: Extraocular movements intact.  Cardiovascular:     Rate and Rhythm: Normal rate and regular rhythm.  Pulmonary:     Effort: Pulmonary effort is normal.     Breath sounds: Normal breath sounds.  Abdominal:     General: There is no distension.     Palpations: Abdomen is soft.     Tenderness: There is no abdominal tenderness.  Musculoskeletal:        General: Normal range of motion.     Cervical back: Normal range of motion.  Skin:    General: Skin is warm.  Neurological:     General: No focal deficit present.     Mental Status: She is alert.  Psychiatric:        Mood and Affect: Mood normal.        Thought Content: Thought content normal.        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   RADIATION DOSE REDUCTION: This exam was performed according to the departmental dose-optimization program which includes automated exposure control, adjustment of the mA and/or kV according to patient size and/or use of iterative reconstruction technique.   COMPARISON:  CT abdomen and pelvis  dated 09/04/2022   FINDINGS: Lower chest: Bilateral lower lobe mild bronchiectasis. No pleural effusion or pneumothorax demonstrated. Partially imaged heart size is normal.   Hepatobiliary: No focal hepatic lesions. No intra or extrahepatic biliary ductal dilation. Normal gallbladder.   Pancreas: No focal lesions or main ductal dilation.   Spleen: Normal in size without focal abnormality.   Adrenals/Urinary Tract: No adrenal nodules. Interval removal of percutaneous left nephrostomy tube. Left ureteral stent in-situ with interval resolution of left hydronephrosis. Interval slight atrophy of the left kidney. No suspicious renal masses by noncontrast technique. No calculi. No focal bladder wall thickening.   Stomach/Bowel: Normal appearance of the stomach. No evidence of bowel wall thickening, distention, or inflammatory changes. Colonic diverticulosis without acute diverticulitis. Normal appendix.   Vascular/Lymphatic: Aortic atherosclerosis. No enlarged abdominal or pelvic lymph nodes.   Reproductive: Hysterectomy. Again seen is left adnexal tethering of the vaginal cuff, left ureter, and adjacent rectum and sigmoid colon. Gas-filled fistulous tract extending superiorly from the vaginal cuff is again seen, where there was contrast previously (5:95).   Other: No free fluid, fluid collection, or free air.   Musculoskeletal: No acute or abnormal lytic or blastic osseous lesions. Multilevel degenerative changes of the partially imaged thoracic and lumbar spine. Small fat-containing paraumbilical hernia.   IMPRESSION: 1. Interval removal of percutaneous left nephrostomy tube. Left ureteral  stent in-situ with interval resolution of left hydronephrosis. 2. Again seen is left adnexal tethering of the vaginal cuff, left ureter, and adjacent rectum and sigmoid colon. Gas-filled fistulous tract extending superiorly from the vaginal cuff is again seen, where there was contrast  previously. 3. Colonic diverticulosis without acute diverticulitis. 4.  Aortic Atherosclerosis (ICD10-I70.0).     Electronically Signed   By: Agustin Cree M.D.   On: 01/02/2023 11:39   Assessment & Plan:  MIMI SHUGARS is a 67 y.o. female with a colovaginal fistula that remains symptomatic and is involving the ureter and ovary. I have reached out to Dr. Despina Hidden to discuss the case as the patient has seen Cyril Mourning NP in the past at that location. I have also reached out to Dr. Ronne Binning given the ureter involvement and potential need for his assistance. I have to coordinate a time that works for them in case they need to come into assist.    Discussed with patient and family the bowel preparation, the post operative course, the likelihood of colostomy given the patient's health status and the inflammation with the fistula. Discussed that the colostomy could be permeant. Discussed that we will do the bowel preparation just in case we get in and are able to do a colectomy and anastomosis but I think this is the less likely case. Discussed potential need for blood products an the risk benefits. Discussed surgery colectomy, colostomy risk of bleeding, infection, ureter injury, possible removal of the ovary, and potential colostomy that avoids removing any of the diseased colon and diverts her due to the inflammation. Discussed needing a colonoscopy in follow up if that is the case due to leaving the fistula and ensuring that it is not cancer.    Colon Preparation:  Buy from the Store: Miralax bottle [phone]).  Gatorade 64 oz (not red). Dulcolax tablets.    The Day Prior to Surgery: Take 4 ducolax tablets at 7am with water. Drink plenty of clear liquids all day to avoid dehydration, no solid food.    Mix the bottle of Miralax and 64 oz of Gatorade and drink this mixture starting at 10am. Drink it gradually over the next few hours, 8 ounces every 15-30 minutes until it is gone. Finish this by  2pm.  Take 2 neomycin 500mg  tablets and 2 metronidazole 500mg  tablets at 2 pm. Take 2 neomycin 500mg  tablets and 2 metronidazole 500mg  tablets at 3pm. Take 2 neomycin 500mg  tablets and 2 metronidazole 500mg  tablets at 10pm.    Do not eat or drink anything after midnight the night before your surgery.  Do not eat or drink anything that morning, and take medications as instructed by the hospital staff on your preoperative visit.    Hold your Medications as follows: Pomalidomide hold 1 week (7 days) prior to surgery, will resume ~2 weeks after surgery (Dr. Ellin Saba) Dexamethasone hold 1 week prior to surgery, will resume ~2 weeks after surgery (Dr. Ellin Saba) Ozempic hold 1 week prior to surgery Eliquis hold 3 days prior to surgery (Cardiology)    All questions were answered to the satisfaction of the patient and family.       Lucretia Roers 01/03/2023, 12:06 PM

## 2023-01-09 NOTE — Telephone Encounter (Signed)
Order placed

## 2023-01-11 ENCOUNTER — Other Ambulatory Visit: Payer: Self-pay | Admitting: *Deleted

## 2023-01-11 DIAGNOSIS — G8929 Other chronic pain: Secondary | ICD-10-CM

## 2023-01-11 MED ORDER — OXYCODONE-ACETAMINOPHEN 5-325 MG PO TABS: 1.0000 | ORAL_TABLET | Freq: Two times a day (BID) | ORAL | 0 refills | Status: DC | PRN

## 2023-01-17 ENCOUNTER — Other Ambulatory Visit: Payer: Self-pay

## 2023-01-17 MED ORDER — POMALIDOMIDE 4 MG PO CAPS: 4.0000 mg | ORAL_CAPSULE | Freq: Every day | ORAL | 0 refills | Status: DC

## 2023-01-17 NOTE — Telephone Encounter (Signed)
Chart reviewed. Pomalyst refilled per verbal order from Dr. Ellin Saba. MyChart message sent to patient confirming how to take and hold Pomalyst appropriately due to upcoming surgery.

## 2023-01-18 NOTE — Patient Instructions (Addendum)
Your procedure is scheduled on: 01/24/2023  Report to [facility] Main Entrance at   7:30  AM.  Call this number if you have problems the morning of surgery: [phone]   Remember:   Follow instructions in After visit summary from Dr Henreitta Leber office regarding bowel prep and antibiotics  Clear liquids the day before surgery until 6:30 am the morning of surgery  Drink 2 carb loading drinks the night before surgery and one at 6:15 the morning of surgery.  Hold Eliquis for 3 days prior to surgery Hold Ozempic for 7 days prior to surgery         No Smoking the morning of surgery  :  Take these medicines the morning of surgery with A SIP OF WATER: Lyrica and oxycodone if needed  Use inhalers if needed   Do not wear jewelry, make-up or nail polish.  Do not wear lotions, powders, or perfumes. You may wear deodorant.  Do not shave 48 hours prior to surgery. Men may shave face and neck.  Do not bring valuables to the hospital.  Contacts, dentures or bridgework may not be worn into surgery.  Leave suitcase in the car. After surgery it may be brought to your room.  For patients admitted to the hospital, checkout time is 11:00 AM the day of discharge.   Patients discharged the day of surgery will not be allowed to drive home.    Special Instructions: Shower using CHG night before surgery and shower the day of surgery use CHG.  Use special wash - you have one bottle of CHG for all showers.  You should use approximately 1/2 of the bottle for each shower. How to Use Chlorhexidine Before Surgery Chlorhexidine gluconate (CHG) is a germ-killing (antiseptic) solution that is used to clean the skin. It can get rid of the bacteria that normally live on the skin and can keep them away for about 24 hours. To clean your skin with CHG, you may be given: A CHG solution to use in the shower or as part of a sponge bath. A prepackaged cloth that contains CHG. Cleaning your skin with CHG may help lower the  risk for infection: While you are staying in the intensive care unit of the hospital. If you have a vascular access, such as a central line, to provide short-term or long-term access to your veins. If you have a catheter to drain urine from your bladder. If you are on a ventilator. A ventilator is a machine that helps you breathe by moving air in and out of your lungs. After surgery. What are the risks? Risks of using CHG include: A skin reaction. Hearing loss, if CHG gets in your ears and you have a perforated eardrum. Eye injury, if CHG gets in your eyes and is not rinsed out. The CHG product catching fire. Make sure that you avoid smoking and flames after applying CHG to your skin. Do not use CHG: If you have a chlorhexidine allergy or have previously reacted to chlorhexidine. On babies younger than 67 months of age. How to use CHG solution Use CHG only as told by your health care provider, and follow the instructions on the label. Use the full amount of CHG as directed. Usually, this is one bottle. During a shower Follow these steps when using CHG solution during a shower (unless your health care provider gives you different instructions): Start the shower. Use your normal soap and shampoo to wash your face and hair. Turn off  the shower or move out of the shower stream. Pour the CHG onto a clean washcloth. Do not use any type of brush or rough-edged sponge. Starting at your neck, lather your body down to your toes. Make sure you follow these instructions: If you will be having surgery, pay special attention to the part of your body where you will be having surgery. Scrub this area for at least 1 minute. Do not use CHG on your head or face. If the solution gets into your ears or eyes, rinse them well with water. Avoid your genital area. Avoid any areas of skin that have broken skin, cuts, or scrapes. Scrub your back and under your arms. Make sure to wash skin folds. Let the lather  sit on your skin for 1-2 minutes or as long as told by your health care provider. Thoroughly rinse your entire body in the shower. Make sure that all body creases and crevices are rinsed well. Dry off with a clean towel. Do not put any substances on your body afterward--such as powder, lotion, or perfume--unless you are told to do so by your health care provider. Only use lotions that are recommended by the manufacturer. Put on clean clothes or pajamas. If it is the night before your surgery, sleep in clean sheets.  During a sponge bath Follow these steps when using CHG solution during a sponge bath (unless your health care provider gives you different instructions): Use your normal soap and shampoo to wash your face and hair. Pour the CHG onto a clean washcloth. Starting at your neck, lather your body down to your toes. Make sure you follow these instructions: If you will be having surgery, pay special attention to the part of your body where you will be having surgery. Scrub this area for at least 1 minute. Do not use CHG on your head or face. If the solution gets into your ears or eyes, rinse them well with water. Avoid your genital area. Avoid any areas of skin that have broken skin, cuts, or scrapes. Scrub your back and under your arms. Make sure to wash skin folds. Let the lather sit on your skin for 1-2 minutes or as long as told by your health care provider. Using a different clean, wet washcloth, thoroughly rinse your entire body. Make sure that all body creases and crevices are rinsed well. Dry off with a clean towel. Do not put any substances on your body afterward--such as powder, lotion, or perfume--unless you are told to do so by your health care provider. Only use lotions that are recommended by the manufacturer. Put on clean clothes or pajamas. If it is the night before your surgery, sleep in clean sheets. How to use CHG prepackaged cloths Only use CHG cloths as told by your  health care provider, and follow the instructions on the label. Use the CHG cloth on clean, dry skin. Do not use the CHG cloth on your head or face unless your health care provider tells you to. When washing with the CHG cloth: Avoid your genital area. Avoid any areas of skin that have broken skin, cuts, or scrapes. Before surgery Follow these steps when using a CHG cloth to clean before surgery (unless your health care provider gives you different instructions): Using the CHG cloth, vigorously scrub the part of your body where you will be having surgery. Scrub using a back-and-forth motion for 3 minutes. The area on your body should be completely wet with CHG when you are  done scrubbing. Do not rinse. Discard the cloth and let the area air-dry. Do not put any substances on the area afterward, such as powder, lotion, or perfume. Put on clean clothes or pajamas. If it is the night before your surgery, sleep in clean sheets.  For general bathing Follow these steps when using CHG cloths for general bathing (unless your health care provider gives you different instructions). Use a separate CHG cloth for each area of your body. Make sure you wash between any folds of skin and between your fingers and toes. Wash your body in the following order, switching to a new cloth after each step: The front of your neck, shoulders, and chest. Both of your arms, under your arms, and your hands. Your stomach and groin area, avoiding the genitals. Your right leg and foot. Your left leg and foot. The back of your neck, your back, and your buttocks. Do not rinse. Discard the cloth and let the area air-dry. Do not put any substances on your body afterward--such as powder, lotion, or perfume--unless you are told to do so by your health care provider. Only use lotions that are recommended by the manufacturer. Put on clean clothes or pajamas. Contact a health care provider if: Your skin gets irritated after  scrubbing. You have questions about using your solution or cloth. You swallow any chlorhexidine. Call your local poison control center ([phone] in the [place] Get help right away if: Your eyes itch badly, or they become very red or swollen. Your skin itches badly and is red or swollen. Your hearing changes. You have trouble seeing. You have swelling or tingling in your mouth or throat. You have trouble breathing. These symptoms may represent a serious problem that is an emergency. Do not wait to see if the symptoms will go away. Get medical help right away. Call your local emergency services (911 in the [place] Do not drive yourself to the hospital. Summary Chlorhexidine gluconate (CHG) is a germ-killing (antiseptic) solution that is used to clean the skin. Cleaning your skin with CHG may help to lower your risk for infection. You may be given CHG to use for bathing. It may be in a bottle or in a prepackaged cloth to use on your skin. Carefully follow your health care provider's instructions and the instructions on the product label. Do not use CHG if you have a chlorhexidine allergy. Contact your health care provider if your skin gets irritated after scrubbing. This information is not intended to replace advice given to you by your health care provider. Make sure you discuss any questions you have with your health care provider. Document Revised: 05/23/2021 Document Reviewed: 04/05/2020 Elsevier Patient Education  2023 [employer] Inc. Open Partial Colectomy, Adult, Care After The following information offers guidance on how to care for yourself after your procedure. Your health care provider may also give you more specific instructions. If you have problems or questions, contact your health care provider. What can I expect after the surgery? After the procedure, it is common to have: Pain, bruising, or swelling in your abdomen, especially in the incision areas. You will be given medicine  to control the pain. Tiredness. Your energy level will return to normal over the next several weeks. Changes in your bowel movements, especially having bowel movements more often. Bloating. Nausea. Trouble urinating. Follow these instructions at home: Medicines Take over-the-counter and prescription medicines only as told by your health care provider. Ask your health care provider if the medicine prescribed to  you: Requires you to avoid driving or using machinery. Can cause constipation. You may need to take these actions to prevent or treat constipation: Drink enough fluids to keep your urine pale yellow. Take over-the-counter or prescription medicines, including stool softeners. Limit foods that are high in fat and processed sugars, such as fried or sweet foods. Eating and drinking Follow instructions from your health care provider about eating or drinking restrictions. Do not drink alcohol if your health care provider tells you not to drink. Eat a low-fiber diet for the first 4 weeks after surgery. Most people on a low-fiber eating plan should eat less than 10 grams (g) of fiber a day. Follow recommendations from your health care provider or dietician about how much fiber you should have each day. Always check food labels to know the fiber content of packaged foods. In general, a low-fiber food will have fewer than 2 g of fiber per serving. In general, try to avoid whole grains, raw fruits and vegetables, dried fruit, tough cuts of meat, nuts, and seeds. Incision care  Follow instructions from your health care provider about how to take care of your incision. Make sure you: Wash your hands with soap and water for at least 20 seconds before and after you change your bandage (dressing). If soap and water are not available, use hand sanitizer. Change your dressing as told by your health care provider. Leave stitches (sutures), skin glue, or adhesive strips in place. These skin closures may  need to stay in place for 2 weeks or longer. If adhesive strip edges start to loosen and curl up, you may trim the loose edges. Do not remove adhesive strips completely unless your health care provider tells you to do that. Check your incision area every day for signs of infection. Check for: More redness, swelling, or pain. Fluid or blood. Warmth. Pus or a bad smell. Keep your incisions clean and dry. Avoid wearing tight clothing around your incision. Do not take baths, swim, or use a hot tub until your health care provider approves. Ask your health care provider if you may take showers. You may only be allowed to take sponge baths. Activity  Rest as told by your health care provider. Avoid sitting for a long time without moving. Get up and take short walks every 1-2 hours. This is important to improve blood flow and breathing. Ask for help if you feel weak or unsteady. You may have to avoid lifting. Ask your health care provider how much you can safely lift. Do deep breathing exercises as told by your health care provider. Place your hands or a pillow over your incision area before you cough. Return to your normal activities as told by your health care provider. Ask your health care provider what activities are safe for you. General instructions Do not use any products that contain nicotine or tobacco. These products include cigarettes, chewing tobacco, and vaping devices, such as e-cigarettes. If you need help quitting, ask your health care provider. Wear compression stockings as told by your health care provider. These stockings help to prevent blood clots and reduce swelling in your legs. Keep all follow-up visits. This is important to monitor healing and check for any complications. Contact a health care provider if: You have not had a bowel movement in 3 days after surgery. You have chills or fever. Medicine is not controlling your pain. You have any signs of infection at your incision  area. You have nausea and vomiting that will  not go away. Get help right away if: You have chest pain or trouble breathing. You have a warm, tender swelling in your leg. Your incision breaks open. You have severe pain. You have bleeding from the rectum. These symptoms may be an emergency. Get help right away. Call 911. Do not wait to see if the symptoms will go away. Do not drive yourself to the hospital. Summary After your procedure, it is common to have pain and to be tired. Your incision area may also be sore and tender. Take over-the-counter and prescription medicines only as told by your health care provider. Return to your normal activities as told by your health care provider. Follow instructions from your health care provider about how to take care of your incision. Get help right away if you have chest pain or trouble breathing. This information is not intended to replace advice given to you by your health care provider. Make sure you discuss any questions you have with your health care provider. Document Revised: 05/11/2021 Document Reviewed: 05/11/2021 Elsevier Patient Education  2024 Elsevier Inc. General Anesthesia, Adult, Care After The following information offers guidance on how to care for yourself after your procedure. Your health care provider may also give you more specific instructions. If you have problems or questions, contact your health care provider. What can I expect after the procedure? After the procedure, it is common for people to: Have pain or discomfort at the IV site. Have nausea or vomiting. Have a sore throat or hoarseness. Have trouble concentrating. Feel cold or chills. Feel weak, sleepy, or tired (fatigue). Have soreness and body aches. These can affect parts of the body that were not involved in surgery. Follow these instructions at home: For the time period you were told by your health care provider:  Rest. Do not participate in activities  where you could fall or become injured. Do not drive or use machinery. Do not drink alcohol. Do not take sleeping pills or medicines that cause drowsiness. Do not make important decisions or sign legal documents. Do not take care of children on your own. General instructions Drink enough fluid to keep your urine pale yellow. If you have sleep apnea, surgery and certain medicines can increase your risk for breathing problems. Follow instructions from your health care provider about wearing your sleep device: Anytime you are sleeping, including during daytime naps. While taking prescription pain medicines, sleeping medicines, or medicines that make you drowsy. Return to your normal activities as told by your health care provider. Ask your health care provider what activities are safe for you. Take over-the-counter and prescription medicines only as told by your health care provider. Do not use any products that contain nicotine or tobacco. These products include cigarettes, chewing tobacco, and vaping devices, such as e-cigarettes. These can delay incision healing after surgery. If you need help quitting, ask your health care provider. Contact a health care provider if: You have nausea or vomiting that does not get better with medicine. You vomit every time you eat or drink. You have pain that does not get better with medicine. You cannot urinate or have bloody urine. You develop a skin rash. You have a fever. Get help right away if: You have trouble breathing. You have chest pain. You vomit blood. These symptoms may be an emergency. Get help right away. Call 911. Do not wait to see if the symptoms will go away. Do not drive yourself to the hospital. Summary After the procedure, it is  common to have a sore throat, hoarseness, nausea, vomiting, or to feel weak, sleepy, or fatigue. For the time period you were told by your health care provider, do not drive or use machinery. Get help right  away if you have difficulty breathing, have chest pain, or vomit blood. These symptoms may be an emergency. This information is not intended to replace advice given to you by your health care provider. Make sure you discuss any questions you have with your health care provider. Document Revised: 04/22/2021 Document Reviewed: 04/22/2021 Elsevier Patient Education  2024 [employer].

## 2023-01-19 ENCOUNTER — Encounter (HOSPITAL_COMMUNITY): Payer: Self-pay

## 2023-01-19 ENCOUNTER — Encounter (HOSPITAL_COMMUNITY)
Admission: RE | Admit: 2023-01-19 | Discharge: 2023-01-19 | Disposition: A | Discharge status: Home / Self Care | Payer: 59 | Source: Ambulatory Visit | Attending: General Surgery | Admitting: General Surgery

## 2023-01-19 VITALS — BP 123/78 | HR 78 | Temp 97.9°F | Resp 18 | Ht 62.0 in | Wt 295.0 lb

## 2023-01-19 DIAGNOSIS — N183 Chronic kidney disease, stage 3 unspecified: Secondary | ICD-10-CM | POA: Diagnosis not present

## 2023-01-19 DIAGNOSIS — Z01818 Encounter for other preprocedural examination: Secondary | ICD-10-CM

## 2023-01-19 DIAGNOSIS — R7309 Other abnormal glucose: Secondary | ICD-10-CM | POA: Insufficient documentation

## 2023-01-19 DIAGNOSIS — Z6841 Body Mass Index (BMI) 40.0 and over, adult: Secondary | ICD-10-CM | POA: Diagnosis not present

## 2023-01-19 DIAGNOSIS — I10 Essential (primary) hypertension: Secondary | ICD-10-CM

## 2023-01-19 DIAGNOSIS — N824 Other female intestinal-genital tract fistulae: Secondary | ICD-10-CM | POA: Diagnosis not present

## 2023-01-19 DIAGNOSIS — Z01812 Encounter for preprocedural laboratory examination: Secondary | ICD-10-CM | POA: Insufficient documentation

## 2023-01-19 DIAGNOSIS — D630 Anemia in neoplastic disease: Secondary | ICD-10-CM | POA: Insufficient documentation

## 2023-01-19 HISTORY — DX: Sleep apnea, unspecified: G47.30

## 2023-01-19 HISTORY — DX: Type 2 diabetes mellitus without complications: E11.9

## 2023-01-19 HISTORY — DX: Dyspnea, unspecified: R06.00

## 2023-01-19 HISTORY — DX: Unspecified asthma, uncomplicated: J45.909

## 2023-01-19 LAB — CBC WITH DIFFERENTIAL/PLATELET
Abs Immature Granulocytes: 0.19 10*3/uL — ABNORMAL HIGH (ref 0.00–0.07)
Basophils Absolute: 0 10*3/uL (ref 0.0–0.1)
Basophils Relative: 1 %
Eosinophils Absolute: 0.3 10*3/uL (ref 0.0–0.5)
Eosinophils Relative: 7 %
HCT: 30.3 % — ABNORMAL LOW (ref 36.0–46.0)
Hemoglobin: 9.9 g/dL — ABNORMAL LOW (ref 12.0–15.0)
Immature Granulocytes: 5 %
Lymphocytes Relative: 17 %
Lymphs Abs: 0.6 10*3/uL — ABNORMAL LOW (ref 0.7–4.0)
MCH: 30.4 pg (ref 26.0–34.0)
MCHC: 32.7 g/dL (ref 30.0–36.0)
MCV: 92.9 fL (ref 80.0–100.0)
Monocytes Absolute: 0.7 10*3/uL (ref 0.1–1.0)
Monocytes Relative: 18 %
Neutro Abs: 2 10*3/uL (ref 1.7–7.7)
Neutrophils Relative %: 52 %
Platelets: 235 10*3/uL (ref 150–400)
RBC: 3.26 MIL/uL — ABNORMAL LOW (ref 3.87–5.11)
RDW: 15.2 % (ref 11.5–15.5)
WBC: 3.8 10*3/uL — ABNORMAL LOW (ref 4.0–10.5)
nRBC: 0 % (ref 0.0–0.2)

## 2023-01-19 LAB — PREPARE RBC (CROSSMATCH)

## 2023-01-19 LAB — BASIC METABOLIC PANEL
Anion gap: 13 (ref 5–15)
BUN: 58 mg/dL — ABNORMAL HIGH (ref 8–23)
CO2: 29 mmol/L (ref 22–32)
Calcium: 9.6 mg/dL (ref 8.9–10.3)
Chloride: 95 mmol/L — ABNORMAL LOW (ref 98–111)
Creatinine, Ser: 2.26 mg/dL — ABNORMAL HIGH (ref 0.44–1.00)
GFR, Estimated: 23 mL/min — ABNORMAL LOW (ref >60)
Glucose, Bld: 98 mg/dL (ref 70–99)
Potassium: 3.7 mmol/L (ref 3.5–5.1)
Sodium: 137 mmol/L (ref 135–145)

## 2023-01-19 LAB — HEMOGLOBIN A1C
Hgb A1c MFr Bld: 6.3 % — ABNORMAL HIGH (ref 4.8–5.6)
Mean Plasma Glucose: 134.11 mg/dL

## 2023-01-24 ENCOUNTER — Encounter (HOSPITAL_COMMUNITY): Payer: Self-pay | Admitting: General Surgery

## 2023-01-24 ENCOUNTER — Encounter (HOSPITAL_COMMUNITY)
Admission: RE | Disposition: A | Discharge status: Home Health | Payer: Self-pay | Source: Home / Self Care | Attending: General Surgery

## 2023-01-24 ENCOUNTER — Other Ambulatory Visit: Payer: Self-pay

## 2023-01-24 ENCOUNTER — Inpatient Hospital Stay (HOSPITAL_COMMUNITY): Payer: 59 | Admitting: Anesthesiology

## 2023-01-24 ENCOUNTER — Inpatient Hospital Stay (HOSPITAL_COMMUNITY)
Admission: RE | Admit: 2023-01-24 | Discharge: 2023-01-29 | DRG: 330 | Disposition: A | Discharge status: Home Health | Payer: 59 | Attending: General Surgery | Admitting: General Surgery

## 2023-01-24 DIAGNOSIS — I130 Hypertensive heart and chronic kidney disease with heart failure and stage 1 through stage 4 chronic kidney disease, or unspecified chronic kidney disease: Secondary | ICD-10-CM | POA: Diagnosis present

## 2023-01-24 DIAGNOSIS — D509 Iron deficiency anemia, unspecified: Secondary | ICD-10-CM

## 2023-01-24 DIAGNOSIS — S3710XA Unspecified injury of ureter, initial encounter: Secondary | ICD-10-CM | POA: Diagnosis not present

## 2023-01-24 DIAGNOSIS — C9002 Multiple myeloma in relapse: Secondary | ICD-10-CM | POA: Diagnosis present

## 2023-01-24 DIAGNOSIS — N179 Acute kidney failure, unspecified: Secondary | ICD-10-CM | POA: Diagnosis present

## 2023-01-24 DIAGNOSIS — J45909 Unspecified asthma, uncomplicated: Secondary | ICD-10-CM | POA: Diagnosis present

## 2023-01-24 DIAGNOSIS — Z9484 Stem cells transplant status: Secondary | ICD-10-CM

## 2023-01-24 DIAGNOSIS — I4892 Unspecified atrial flutter: Secondary | ICD-10-CM | POA: Diagnosis present

## 2023-01-24 DIAGNOSIS — F40240 Claustrophobia: Secondary | ICD-10-CM | POA: Diagnosis present

## 2023-01-24 DIAGNOSIS — I959 Hypotension, unspecified: Secondary | ICD-10-CM | POA: Diagnosis present

## 2023-01-24 DIAGNOSIS — K651 Peritoneal abscess: Secondary | ICD-10-CM | POA: Diagnosis present

## 2023-01-24 DIAGNOSIS — K660 Peritoneal adhesions (postprocedural) (postinfection): Secondary | ICD-10-CM | POA: Diagnosis present

## 2023-01-24 DIAGNOSIS — N183 Chronic kidney disease, stage 3 unspecified: Secondary | ICD-10-CM | POA: Diagnosis present

## 2023-01-24 DIAGNOSIS — E1122 Type 2 diabetes mellitus with diabetic chronic kidney disease: Secondary | ICD-10-CM | POA: Diagnosis present

## 2023-01-24 DIAGNOSIS — N824 Other female intestinal-genital tract fistulae: Secondary | ICD-10-CM | POA: Diagnosis present

## 2023-01-24 DIAGNOSIS — D62 Acute posthemorrhagic anemia: Secondary | ICD-10-CM | POA: Diagnosis not present

## 2023-01-24 DIAGNOSIS — G4733 Obstructive sleep apnea (adult) (pediatric): Secondary | ICD-10-CM

## 2023-01-24 DIAGNOSIS — E1165 Type 2 diabetes mellitus with hyperglycemia: Secondary | ICD-10-CM | POA: Diagnosis present

## 2023-01-24 DIAGNOSIS — D72819 Decreased white blood cell count, unspecified: Secondary | ICD-10-CM | POA: Diagnosis present

## 2023-01-24 DIAGNOSIS — M25561 Pain in right knee: Secondary | ICD-10-CM | POA: Diagnosis present

## 2023-01-24 DIAGNOSIS — Y92234 Operating room of hospital as the place of occurrence of the external cause: Secondary | ICD-10-CM | POA: Diagnosis not present

## 2023-01-24 DIAGNOSIS — I5030 Unspecified diastolic (congestive) heart failure: Secondary | ICD-10-CM | POA: Diagnosis present

## 2023-01-24 DIAGNOSIS — N823 Fistula of vagina to large intestine: Secondary | ICD-10-CM

## 2023-01-24 DIAGNOSIS — N184 Chronic kidney disease, stage 4 (severe): Secondary | ICD-10-CM | POA: Diagnosis present

## 2023-01-24 DIAGNOSIS — D6859 Other primary thrombophilia: Secondary | ICD-10-CM | POA: Diagnosis present

## 2023-01-24 DIAGNOSIS — Z8249 Family history of ischemic heart disease and other diseases of the circulatory system: Secondary | ICD-10-CM

## 2023-01-24 DIAGNOSIS — Y838 Other surgical procedures as the cause of abnormal reaction of the patient, or of later complication, without mention of misadventure at the time of the procedure: Secondary | ICD-10-CM | POA: Diagnosis not present

## 2023-01-24 DIAGNOSIS — Z7901 Long term (current) use of anticoagulants: Secondary | ICD-10-CM

## 2023-01-24 DIAGNOSIS — S3713XA Laceration of ureter, initial encounter: Secondary | ICD-10-CM | POA: Diagnosis not present

## 2023-01-24 DIAGNOSIS — K5790 Diverticulosis of intestine, part unspecified, without perforation or abscess without bleeding: Secondary | ICD-10-CM | POA: Diagnosis present

## 2023-01-24 DIAGNOSIS — E785 Hyperlipidemia, unspecified: Secondary | ICD-10-CM | POA: Diagnosis not present

## 2023-01-24 DIAGNOSIS — I10 Essential (primary) hypertension: Secondary | ICD-10-CM | POA: Diagnosis not present

## 2023-01-24 DIAGNOSIS — Z888 Allergy status to other drugs, medicaments and biological substances status: Secondary | ICD-10-CM

## 2023-01-24 DIAGNOSIS — Z6841 Body Mass Index (BMI) 40.0 and over, adult: Secondary | ICD-10-CM | POA: Diagnosis not present

## 2023-01-24 DIAGNOSIS — Z7985 Long-term (current) use of injectable non-insulin antidiabetic drugs: Secondary | ICD-10-CM

## 2023-01-24 DIAGNOSIS — E1140 Type 2 diabetes mellitus with diabetic neuropathy, unspecified: Secondary | ICD-10-CM | POA: Diagnosis present

## 2023-01-24 DIAGNOSIS — M25562 Pain in left knee: Secondary | ICD-10-CM | POA: Diagnosis present

## 2023-01-24 DIAGNOSIS — D630 Anemia in neoplastic disease: Principal | ICD-10-CM

## 2023-01-24 DIAGNOSIS — C9000 Multiple myeloma not having achieved remission: Secondary | ICD-10-CM

## 2023-01-24 DIAGNOSIS — I5032 Chronic diastolic (congestive) heart failure: Secondary | ICD-10-CM | POA: Diagnosis present

## 2023-01-24 DIAGNOSIS — R0902 Hypoxemia: Secondary | ICD-10-CM | POA: Diagnosis present

## 2023-01-24 DIAGNOSIS — G4734 Idiopathic sleep related nonobstructive alveolar hypoventilation: Secondary | ICD-10-CM | POA: Diagnosis present

## 2023-01-24 DIAGNOSIS — Z79899 Other long term (current) drug therapy: Secondary | ICD-10-CM

## 2023-01-24 HISTORY — PX: COLECTOMY WITH COLOSTOMY CREATION/HARTMANN PROCEDURE: SHX6598

## 2023-01-24 HISTORY — PX: COLON RESECTION SIGMOID: SHX6737

## 2023-01-24 LAB — GLUCOSE, CAPILLARY
Glucose-Capillary: 174 mg/dL — ABNORMAL HIGH (ref 70–99)
Glucose-Capillary: 177 mg/dL — ABNORMAL HIGH (ref 70–99)
Glucose-Capillary: 178 mg/dL — ABNORMAL HIGH (ref 70–99)

## 2023-01-24 SURGERY — COLECTOMY, WITH COLOSTOMY CREATION
Anesthesia: General | Site: Ureter | Laterality: Right

## 2023-01-24 MED ORDER — PROPOFOL 10 MG/ML IV BOLUS
INTRAVENOUS | Status: AC
  Filled 2023-01-24: qty 20

## 2023-01-24 MED ORDER — OXYCODONE HCL 5 MG PO TABS: 5.0000 mg | ORAL_TABLET | Freq: Once | ORAL | Status: DC | PRN

## 2023-01-24 MED ORDER — ENSURE PRE-SURGERY PO LIQD: 296.0000 mL | Freq: Once | ORAL | Status: DC

## 2023-01-24 MED ORDER — MELATONIN 3 MG PO TABS
3.0000 mg | ORAL_TABLET | Freq: Every evening | ORAL | Status: DC | PRN
  Filled 2023-01-24: qty 1

## 2023-01-24 MED ORDER — SODIUM CHLORIDE 0.9 % IV SOLN
2.0000 g | Freq: Two times a day (BID) | INTRAVENOUS | Status: AC
  Administered 2023-01-24 – 2023-01-27 (×6): 2 g via INTRAVENOUS
  Filled 2023-01-24 (×10): qty 2

## 2023-01-24 MED ORDER — FENTANYL CITRATE PF 50 MCG/ML IJ SOSY
25.0000 ug | PREFILLED_SYRINGE | INTRAMUSCULAR | Status: DC | PRN
Start: 2023-01-24 — End: 2023-01-24
  Administered 2023-01-24: 50 ug via INTRAVENOUS
  Filled 2023-01-24: qty 1

## 2023-01-24 MED ORDER — ENSURE PRE-SURGERY PO LIQD: 592.0000 mL | Freq: Once | ORAL | Status: DC

## 2023-01-24 MED ORDER — MIDAZOLAM HCL 2 MG/2ML IJ SOLN
INTRAMUSCULAR | Status: AC
  Filled 2023-01-24: qty 2

## 2023-01-24 MED ORDER — HEPARIN SODIUM (PORCINE) 5000 UNIT/ML IJ SOLN
5000.0000 [IU] | Freq: Once | INTRAMUSCULAR | Status: AC
  Administered 2023-01-24: 5000 [IU] via SUBCUTANEOUS

## 2023-01-24 MED ORDER — DIPHENHYDRAMINE HCL 12.5 MG/5ML PO ELIX: 12.5000 mg | ORAL_SOLUTION | Freq: Four times a day (QID) | ORAL | Status: DC | PRN

## 2023-01-24 MED ORDER — METOPROLOL TARTRATE 5 MG/5ML IV SOLN: 5.0000 mg | Freq: Four times a day (QID) | INTRAVENOUS | Status: DC | PRN

## 2023-01-24 MED ORDER — FENTANYL CITRATE (PF) 100 MCG/2ML IJ SOLN
INTRAMUSCULAR | Status: AC
  Filled 2023-01-24: qty 2

## 2023-01-24 MED ORDER — OXYCODONE HCL 5 MG PO TABS
5.0000 mg | ORAL_TABLET | ORAL | Status: DC | PRN
Start: 2023-01-24 — End: 2023-01-29
  Administered 2023-01-24 – 2023-01-25 (×5): 10 mg via ORAL
  Administered 2023-01-26 – 2023-01-29 (×10): 5 mg via ORAL
  Filled 2023-01-24 (×3): qty 1
  Filled 2023-01-24 (×2): qty 2
  Filled 2023-01-24: qty 1
  Filled 2023-01-24: qty 2
  Filled 2023-01-24 (×3): qty 1
  Filled 2023-01-24: qty 2
  Filled 2023-01-24 (×3): qty 1
  Filled 2023-01-24: qty 2

## 2023-01-24 MED ORDER — LACTATED RINGERS IV SOLN: INTRAVENOUS | Status: DC

## 2023-01-24 MED ORDER — FENTANYL CITRATE (PF) 100 MCG/2ML IJ SOLN
INTRAMUSCULAR | Status: DC | PRN
  Administered 2023-01-24: 50 ug via INTRAVENOUS
  Administered 2023-01-24 (×2): 100 ug via INTRAVENOUS
  Administered 2023-01-24 (×2): 50 ug via INTRAVENOUS

## 2023-01-24 MED ORDER — HYDROMORPHONE HCL 1 MG/ML IJ SOLN
INTRAMUSCULAR | Status: DC | PRN
  Administered 2023-01-24 (×2): .5 mg via INTRAVENOUS

## 2023-01-24 MED ORDER — DEXMEDETOMIDINE HCL IN NACL 80 MCG/20ML IV SOLN
INTRAVENOUS | Status: DC | PRN
  Administered 2023-01-24: 16 ug via INTRAVENOUS
  Administered 2023-01-24: 8 ug via INTRAVENOUS
  Administered 2023-01-24 (×2): 12 ug via INTRAVENOUS

## 2023-01-24 MED ORDER — FENTANYL CITRATE (PF) 250 MCG/5ML IJ SOLN
INTRAMUSCULAR | Status: AC
  Filled 2023-01-24: qty 5

## 2023-01-24 MED ORDER — DIPHENHYDRAMINE HCL 50 MG/ML IJ SOLN: 12.5000 mg | Freq: Four times a day (QID) | INTRAMUSCULAR | Status: DC | PRN

## 2023-01-24 MED ORDER — ORAL CARE MOUTH RINSE: 15.0000 mL | Freq: Once | OROMUCOSAL | Status: AC

## 2023-01-24 MED ORDER — BUPIVACAINE HCL (PF) 0.5 % IJ SOLN
INTRAMUSCULAR | Status: AC
  Filled 2023-01-24: qty 30

## 2023-01-24 MED ORDER — CHLORHEXIDINE GLUCONATE CLOTH 2 % EX PADS
6.0000 | MEDICATED_PAD | Freq: Every day | CUTANEOUS | Status: DC
  Administered 2023-01-24 – 2023-01-29 (×6): 6 via TOPICAL

## 2023-01-24 MED ORDER — DOCUSATE SODIUM 100 MG PO CAPS
100.0000 mg | ORAL_CAPSULE | Freq: Two times a day (BID) | ORAL | Status: DC
  Administered 2023-01-24 – 2023-01-29 (×10): 100 mg via ORAL
  Filled 2023-01-24 (×11): qty 1

## 2023-01-24 MED ORDER — ALVIMOPAN 12 MG PO CAPS
ORAL_CAPSULE | ORAL | Status: AC
  Administered 2023-01-24: 12 mg via ORAL
  Filled 2023-01-24: qty 1

## 2023-01-24 MED ORDER — ONDANSETRON HCL 4 MG/2ML IJ SOLN
INTRAMUSCULAR | Status: DC | PRN
  Administered 2023-01-24: 4 mg via INTRAVENOUS

## 2023-01-24 MED ORDER — METOLAZONE 5 MG PO TABS
2.5000 mg | ORAL_TABLET | ORAL | Status: DC
  Administered 2023-01-26: 2.5 mg via ORAL
  Filled 2023-01-24: qty 1

## 2023-01-24 MED ORDER — LIDOCAINE HCL (CARDIAC) PF 100 MG/5ML IV SOSY
PREFILLED_SYRINGE | INTRAVENOUS | Status: DC | PRN
  Administered 2023-01-24: 50 mg via INTRAVENOUS

## 2023-01-24 MED ORDER — LACTATED RINGERS IV SOLN: INTRAVENOUS | Status: AC

## 2023-01-24 MED ORDER — DEXAMETHASONE SODIUM PHOSPHATE 10 MG/ML IJ SOLN
INTRAMUSCULAR | Status: DC | PRN
  Administered 2023-01-24: 10 mg via INTRAVENOUS

## 2023-01-24 MED ORDER — ROSUVASTATIN CALCIUM 10 MG PO TABS
10.0000 mg | ORAL_TABLET | Freq: Every day | ORAL | Status: DC
  Administered 2023-01-24 – 2023-01-29 (×6): 10 mg via ORAL
  Filled 2023-01-24 (×6): qty 1

## 2023-01-24 MED ORDER — SIMETHICONE 80 MG PO CHEW
40.0000 mg | CHEWABLE_TABLET | Freq: Four times a day (QID) | ORAL | Status: DC | PRN
Start: 2023-01-24 — End: 2023-01-29

## 2023-01-24 MED ORDER — ROCURONIUM BROMIDE 100 MG/10ML IV SOLN
INTRAVENOUS | Status: DC | PRN
  Administered 2023-01-24: 30 mg via INTRAVENOUS
  Administered 2023-01-24 (×2): 20 mg via INTRAVENOUS
  Administered 2023-01-24: 70 mg via INTRAVENOUS

## 2023-01-24 MED ORDER — SODIUM CHLORIDE 0.9 % IV SOLN
2.0000 g | INTRAVENOUS | Status: AC
  Administered 2023-01-24: 2 g via INTRAVENOUS

## 2023-01-24 MED ORDER — ONDANSETRON HCL 4 MG/2ML IJ SOLN: 4.0000 mg | Freq: Once | INTRAMUSCULAR | Status: DC | PRN

## 2023-01-24 MED ORDER — ACETAMINOPHEN 500 MG PO TABS
1000.0000 mg | ORAL_TABLET | Freq: Four times a day (QID) | ORAL | Status: DC
  Administered 2023-01-24 – 2023-01-29 (×16): 1000 mg via ORAL
  Filled 2023-01-24 (×17): qty 2

## 2023-01-24 MED ORDER — METHOCARBAMOL 1000 MG/10ML IJ SOLN: 500.0000 mg | Freq: Three times a day (TID) | INTRAMUSCULAR | Status: DC | PRN

## 2023-01-24 MED ORDER — SUGAMMADEX SODIUM 500 MG/5ML IV SOLN
INTRAVENOUS | Status: DC | PRN
  Administered 2023-01-24: 400 mg via INTRAVENOUS

## 2023-01-24 MED ORDER — METHOCARBAMOL 500 MG PO TABS
500.0000 mg | ORAL_TABLET | Freq: Three times a day (TID) | ORAL | Status: DC | PRN
  Administered 2023-01-28: 500 mg via ORAL
  Filled 2023-01-24 (×3): qty 1

## 2023-01-24 MED ORDER — HEPARIN SODIUM (PORCINE) 5000 UNIT/ML IJ SOLN
INTRAMUSCULAR | Status: AC
  Filled 2023-01-24: qty 1

## 2023-01-24 MED ORDER — PROPOFOL 10 MG/ML IV BOLUS
INTRAVENOUS | Status: DC | PRN
  Administered 2023-01-24: 120 mg via INTRAVENOUS

## 2023-01-24 MED ORDER — CHLORHEXIDINE GLUCONATE CLOTH 2 % EX PADS: 6.0000 | MEDICATED_PAD | Freq: Once | CUTANEOUS | Status: DC

## 2023-01-24 MED ORDER — ALBUTEROL SULFATE (2.5 MG/3ML) 0.083% IN NEBU
3.0000 mL | INHALATION_SOLUTION | Freq: Four times a day (QID) | RESPIRATORY_TRACT | Status: DC | PRN
  Administered 2023-01-24: 3 mL via RESPIRATORY_TRACT
  Filled 2023-01-24: qty 3

## 2023-01-24 MED ORDER — PHENYLEPHRINE HCL-NACL 20-0.9 MG/250ML-% IV SOLN
INTRAVENOUS | Status: AC
  Filled 2023-01-24: qty 250

## 2023-01-24 MED ORDER — ALVIMOPAN 12 MG PO CAPS
12.0000 mg | ORAL_CAPSULE | ORAL | Status: AC
Start: 2023-01-24 — End: 2023-01-24

## 2023-01-24 MED ORDER — BUPIVACAINE HCL (PF) 0.5 % IJ SOLN
INTRAMUSCULAR | Status: DC | PRN
  Administered 2023-01-24: 30 mL

## 2023-01-24 MED ORDER — PANTOPRAZOLE SODIUM 40 MG IV SOLR
40.0000 mg | Freq: Every day | INTRAVENOUS | Status: DC
  Administered 2023-01-24 – 2023-01-28 (×5): 40 mg via INTRAVENOUS
  Filled 2023-01-24 (×5): qty 10

## 2023-01-24 MED ORDER — OXYCODONE HCL 5 MG/5ML PO SOLN
5.0000 mg | Freq: Once | ORAL | Status: DC | PRN
Start: 2023-01-24 — End: 2023-01-24

## 2023-01-24 MED ORDER — PREGABALIN 75 MG PO CAPS
75.0000 mg | ORAL_CAPSULE | Freq: Two times a day (BID) | ORAL | Status: DC
  Administered 2023-01-24 – 2023-01-29 (×10): 75 mg via ORAL
  Filled 2023-01-24 (×10): qty 1

## 2023-01-24 MED ORDER — INSULIN ASPART 100 UNIT/ML IJ SOLN
0.0000 [IU] | Freq: Three times a day (TID) | INTRAMUSCULAR | Status: DC
  Administered 2023-01-24: 3 [IU] via SUBCUTANEOUS
  Administered 2023-01-25 – 2023-01-26 (×4): 2 [IU] via SUBCUTANEOUS

## 2023-01-24 MED ORDER — SODIUM CHLORIDE 0.9 % IR SOLN
Status: DC | PRN
  Administered 2023-01-24 (×3): 1000 mL

## 2023-01-24 MED ORDER — HYDROMORPHONE HCL 1 MG/ML IJ SOLN
1.0000 mg | INTRAMUSCULAR | Status: DC | PRN
  Administered 2023-01-24 – 2023-01-27 (×9): 1 mg via INTRAVENOUS
  Filled 2023-01-24 (×10): qty 1

## 2023-01-24 MED ORDER — ONDANSETRON HCL 4 MG/2ML IJ SOLN
4.0000 mg | Freq: Four times a day (QID) | INTRAMUSCULAR | Status: DC | PRN
Start: 2023-01-24 — End: 2023-01-29

## 2023-01-24 MED ORDER — ACETAMINOPHEN 500 MG PO TABS
ORAL_TABLET | ORAL | Status: AC
  Administered 2023-01-24: 1000 mg via ORAL
  Filled 2023-01-24: qty 2

## 2023-01-24 MED ORDER — HYDROMORPHONE HCL 1 MG/ML IJ SOLN
INTRAMUSCULAR | Status: AC
  Filled 2023-01-24: qty 1

## 2023-01-24 MED ORDER — ONDANSETRON 4 MG PO TBDP: 4.0000 mg | ORAL_TABLET | Freq: Four times a day (QID) | ORAL | Status: DC | PRN

## 2023-01-24 MED ORDER — ACETAMINOPHEN 500 MG PO TABS
1000.0000 mg | ORAL_TABLET | ORAL | Status: AC
Start: 2023-01-24 — End: 2023-01-24

## 2023-01-24 MED ORDER — PHENYLEPHRINE HCL-NACL 20-0.9 MG/250ML-% IV SOLN
INTRAVENOUS | Status: DC | PRN
  Administered 2023-01-24: 20 ug/min via INTRAVENOUS

## 2023-01-24 MED ORDER — MIDAZOLAM HCL 5 MG/5ML IJ SOLN
INTRAMUSCULAR | Status: DC | PRN
  Administered 2023-01-24 (×2): 1 mg via INTRAVENOUS

## 2023-01-24 MED ORDER — SODIUM CHLORIDE 0.9 % IV SOLN
INTRAVENOUS | Status: AC
  Filled 2023-01-24: qty 2

## 2023-01-24 MED ORDER — CHLORHEXIDINE GLUCONATE 0.12 % MT SOLN
15.0000 mL | Freq: Once | OROMUCOSAL | Status: AC
Start: 2023-01-24 — End: 2023-01-24
  Administered 2023-01-24: 15 mL via OROMUCOSAL

## 2023-01-24 SURGICAL SUPPLY — 77 items
BAG URINE DRAIN 2000ML AR STRL (UROLOGICAL SUPPLIES) IMPLANT
BARRIER SKIN 2 3/4 (OSTOMY) ×3
BARRIER SKIN OD2.25 2 3/4 FLNG (OSTOMY) IMPLANT
CHLORAPREP W/TINT 26 (MISCELLANEOUS) ×3 IMPLANT
CLAMP POUCH DRAINAGE QUIET (OSTOMY) IMPLANT
CLOTH BEACON ORANGE TIMEOUT ST (SAFETY) ×3 IMPLANT
COUNTER NDL MAGNETIC 40 RED (SET/KITS/TRAYS/PACK) IMPLANT
COUNTER NEEDLE MAGNETIC 40 RED (SET/KITS/TRAYS/PACK) ×3
COVER LIGHT HANDLE STERIS (MISCELLANEOUS) ×6 IMPLANT
COVER MAYO STAND XLG (MISCELLANEOUS) IMPLANT
DRAPE HALF SHEET 40X57 (DRAPES) IMPLANT
DRAPE UTILITY W/TAPE 26X15 (DRAPES) IMPLANT
DRAPE WARM FLUID 44X44 (DRAPES) ×3 IMPLANT
DRSG OPSITE POSTOP 4X10 (GAUZE/BANDAGES/DRESSINGS) IMPLANT
ELECT BLADE 6 FLAT ULTRCLN (ELECTRODE) IMPLANT
ELECT REM PT RETURN 9FT ADLT (ELECTROSURGICAL) ×3
ELECTRODE REM PT RTRN 9FT ADLT (ELECTROSURGICAL) ×3 IMPLANT
EVACUATOR DRAINAGE 10X20 100CC (DRAIN) IMPLANT
GAUZE 4X4 16PLY ~~LOC~~+RFID DBL (SPONGE) IMPLANT
GAUZE SPONGE 4X4 12PLY STRL (GAUZE/BANDAGES/DRESSINGS) ×3 IMPLANT
GLOVE BIO SURGEON STRL SZ 6.5 (GLOVE) ×3 IMPLANT
GLOVE BIO SURGEON STRL SZ7 (GLOVE) IMPLANT
GLOVE BIO SURGEON STRL SZ8 (GLOVE) IMPLANT
GLOVE BIOGEL PI IND STRL 6.5 (GLOVE) ×3 IMPLANT
GLOVE BIOGEL PI IND STRL 7.0 (GLOVE) ×6 IMPLANT
GLOVE BIOGEL PI IND STRL 7.5 (GLOVE) IMPLANT
GLOVE BIOGEL PI IND STRL 8 (GLOVE) IMPLANT
GLOVE ECLIPSE 7.0 STRL STRAW (GLOVE) IMPLANT
GLOVE SURG SS PI 6.5 STRL IVOR (GLOVE) IMPLANT
GLOVE SURG SS PI 7.5 STRL IVOR (GLOVE) IMPLANT
GOWN STRL REUS W/TWL LRG LVL3 (GOWN DISPOSABLE) ×9 IMPLANT
GOWN STRL REUS W/TWL XL LVL3 (GOWN DISPOSABLE) IMPLANT
GUIDEWIRE STR ZIPWIRE 035X150 (MISCELLANEOUS) IMPLANT
HANDLE SUCTION POOLE (INSTRUMENTS) ×3 IMPLANT
INST SET MAJOR GENERAL (KITS) ×3 IMPLANT
KIT TURNOVER KIT A (KITS) ×3 IMPLANT
LIGASURE IMPACT 36 18CM CVD LR (INSTRUMENTS) ×3 IMPLANT
MANIFOLD NEPTUNE II (INSTRUMENTS) ×3 IMPLANT
NDL HYPO 21X1.5 SAFETY (NEEDLE) ×3 IMPLANT
NEEDLE HYPO 21X1.5 SAFETY (NEEDLE) ×3
NS IRRIG 1000ML POUR BTL (IV SOLUTION) ×6 IMPLANT
PACK ABDOMINAL MAJOR (CUSTOM PROCEDURE TRAY) ×3 IMPLANT
PACK SRG BSC III STRL LF ECLPS (CUSTOM PROCEDURE TRAY) IMPLANT
PAD ARMBOARD 7.5X6 YLW CONV (MISCELLANEOUS) ×3 IMPLANT
PENCIL HANDSWITCHING (ELECTRODE) IMPLANT
POSITIONER HEAD 8X9X4 ADT (SOFTGOODS) ×3 IMPLANT
RELOAD PROXIMATE 75MM BLUE (ENDOMECHANICALS) ×3
RELOAD STAPLE 75 3.8 BLU REG (ENDOMECHANICALS) IMPLANT
RETRACTOR WND ALEXIS-O 25 LRG (MISCELLANEOUS) IMPLANT
RETRACTOR WOUND ALXS 34CM XLRG (MISCELLANEOUS) IMPLANT
RTRCTR WOUND ALEXIS 34CM XLRG (MISCELLANEOUS) ×3
RTRCTR WOUND ALEXIS O 25CM LRG (MISCELLANEOUS) ×3
SET BASIN LINEN APH (SET/KITS/TRAYS/PACK) ×3 IMPLANT
SHEET LAVH (DRAPES) IMPLANT
SPONGE DRAIN TRACH 4X4 STRL 2S (GAUZE/BANDAGES/DRESSINGS) IMPLANT
SPONGE T-LAP 18X18 ~~LOC~~+RFID (SPONGE) ×3 IMPLANT
STAPLER CVD CUT GN 40 RELOAD (ENDOMECHANICALS) ×3
STAPLER CVD CUT GRN 40 RELOAD (ENDOMECHANICALS) IMPLANT
STAPLER PROXIMATE 75MM BLUE (STAPLE) IMPLANT
STAPLER VISISTAT (STAPLE) ×3 IMPLANT
STENT URET 6FRX26 CONTOUR (STENTS) IMPLANT
SUCTION POOLE HANDLE (INSTRUMENTS) ×6
SUT CHROMIC 0 SH (SUTURE) ×3 IMPLANT
SUT CHROMIC 2 0 SH (SUTURE) IMPLANT
SUT CHROMIC 3 0 SH 27 (SUTURE) IMPLANT
SUT ETHILON 3 0 FSL (SUTURE) IMPLANT
SUT PDS AB CT VIOLET #0 27IN (SUTURE) IMPLANT
SUT SILK 2-0 18XBRD TIE 12 (SUTURE) IMPLANT
SUT SILK 3 0 SH CR/8 (SUTURE) IMPLANT
SUT VIC AB 0 CT1 27XBRD ANTBC (SUTURE) IMPLANT
SUT VIC AB 2-0 CT2 27 (SUTURE) IMPLANT
SUT VIC AB 3-0 SH 27X BRD (SUTURE) IMPLANT
SYR 10ML LL (SYRINGE) IMPLANT
SYR 30ML LL (SYRINGE) ×3 IMPLANT
SYR TOOMEY 50ML (SYRINGE) IMPLANT
TRAY FOLEY MTR SLVR 16FR STAT (SET/KITS/TRAYS/PACK) ×3 IMPLANT
YANKAUER SUCT BULB TIP 10FT TU (MISCELLANEOUS) IMPLANT

## 2023-01-24 NOTE — OR Nursing (Signed)
Per Dr. Henreitta Leber, patient's daughter updated regarding surgery. Informed daughter that the patient is doing well.

## 2023-01-24 NOTE — Progress Notes (Signed)
[facility]  Updated patient and family about colostomy and about colostomy RN coming tomorrow. Clear diet. Labs tomorrow. Discussed the ureter injury, foley to stay in place and fact that ostomy likely permanent.   Algis Greenhouse, MD [facility] [address] [place], [place] [address] [phone] (office)

## 2023-01-24 NOTE — Progress Notes (Signed)
Patient does not wish to use CPAP. No machine left in room.

## 2023-01-24 NOTE — Anesthesia Procedure Notes (Signed)
Procedure Name: Intubation Date/Time: 01/24/2023 8:33 AM  Performed by: Shanon Payor, CRNAPre-anesthesia Checklist: Patient identified, Emergency Drugs available, Suction available, Patient being monitored and Timeout performed Patient Re-evaluated:Patient Re-evaluated prior to induction Oxygen Delivery Method: Circle system utilized Preoxygenation: Pre-oxygenation with 100% oxygen Induction Type: IV induction Ventilation: Mask ventilation without difficulty Laryngoscope Size: Mac and 3 Grade View: Grade I Tube type: Oral Tube size: 7.0 mm Number of attempts: 1 Airway Equipment and Method: Stylet Placement Confirmation: ETT inserted through vocal cords under direct vision, CO2 detector, positive ETCO2 and breath sounds checked- equal and bilateral Secured at: 22 cm Tube secured with: Tape Dental Injury: Teeth and Oropharynx as per pre-operative assessment

## 2023-01-24 NOTE — Anesthesia Preprocedure Evaluation (Signed)
Anesthesia Evaluation  Patient identified by MRN, date of birth, ID band Patient awake    Reviewed: Allergy & Precautions, H&P , NPO status , Patient's Chart, lab work & pertinent test results, reviewed documented beta blocker date and time   Airway Mallampati: II  TM Distance: >3 FB Neck ROM: full    Dental no notable dental hx.    Pulmonary shortness of breath, asthma , sleep apnea , pneumonia   Pulmonary exam normal breath sounds clear to auscultation       Cardiovascular Exercise Tolerance: Good hypertension,  Rhythm:regular Rate:Normal     Neuro/Psych negative neurological ROS  negative psych ROS   GI/Hepatic negative GI ROS, Neg liver ROS,,,  Endo/Other  diabetes    Renal/GU Renal disease  negative genitourinary   Musculoskeletal   Abdominal   Peds  Hematology  (+) Blood dyscrasia, anemia   Anesthesia Other Findings   Reproductive/Obstetrics negative OB ROS                             Anesthesia Physical Anesthesia Plan  ASA: 3  Anesthesia Plan: General and General ETT   Post-op Pain Management:    Induction:   PONV Risk Score and Plan: Ondansetron  Airway Management Planned:   Additional Equipment:   Intra-op Plan:   Post-operative Plan:   Informed Consent: I have reviewed the patients History and Physical, chart, labs and discussed the procedure including the risks, benefits and alternatives for the proposed anesthesia with the patient or authorized representative who has indicated his/her understanding and acceptance.     Dental Advisory Given  Plan Discussed with: CRNA  Anesthesia Plan Comments:        Anesthesia Quick Evaluation

## 2023-01-24 NOTE — TOC CM/SW Note (Signed)
Transition of Care [facility]) - Inpatient Brief Assessment   Patient Details  Name: VESA ALOE MRN: [id number] Date of Birth: 02/26/1955  Transition of Care [facility]) CM/SW Contact:    Villa Herb, LCSWA Phone Number: 01/24/2023, 3:38 PM   Clinical Narrative: Transition of Care Department [facility]) has reviewed patient and no TOC needs have been identified at this time. We will continue to monitor patient advancement through interdisciplinary progression rounds. If new patient transition needs arise, please place a TOC consult.  Transition of Care Asessment: Insurance and Status: Insurance coverage has been reviewed Patient has primary care physician: Yes Home environment has been reviewed: from home Prior level of function:: independent Prior/Current Home Services: No current home services Social Drivers of Health Review: SDOH reviewed no interventions necessary Readmission risk has been reviewed: Yes Transition of care needs: no transition of care needs at this time

## 2023-01-24 NOTE — Consult Note (Signed)
WOC team received consult for new colostomy placed by Dr. Henreitta Leber 01/24/2023.  WOC will plan to begin education Thursday 01/25/2023.    Thank you,    Priscella Mann MSN, RN-BC, [employer] [phone]

## 2023-01-24 NOTE — Hospital Course (Addendum)
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 Henreitta Leber and McKenzie.  The surgeons requested a medical consultation for management during postoperative recovery in hospital for next several days.

## 2023-01-24 NOTE — Brief Op Note (Addendum)
01/24/2023  1:34 PM  PATIENT:  Kelli Hurley  67 y.o. female  PRE-OPERATIVE DIAGNOSIS:  COLOVAGINAL FISTULA  POST-OPERATIVE DIAGNOSIS:  COLOVAGINAL FISTULA, R ureter injury   PROCEDURE:  Sigmoid colectomy, end colostomy, see Dr. Dimas Millin note for Urology procedure   SURGEON:  Surgeons and Role:    * Lucretia Roers, MD - Primary    * Franky Macho, MD - Assisting    * McKenzie, Mardene Celeste, MD - Assisting  ANESTHESIA:   general  EBL:  600 mL   BLOOD ADMINISTERED:none  DRAINS: (pelvis over the bladder /ureter repair) Jackson-Pratt drain(s) with closed bulb suction in the right lower quadrant     LOCAL MEDICATIONS USED:  MARCAINE     SPECIMEN:  Source of Specimen:  sigmoid colon   DISPOSITION OF SPECIMEN:  PATHOLOGY  COUNTS:  YES  TOURNIQUET:  * No tourniquets in log *  DICTATION: .Note written in EPIC  PLAN OF CARE: Admit to inpatient   PATIENT DISPOSITION:  PACU - hemodynamically stable.   Delay start of Pharmacological VTE agent (>24hrs) due to surgical blood loss or risk of bleeding: yes

## 2023-01-24 NOTE — Op Note (Signed)
Preoperative diagnosis: right mid ureteral injury   Postop diagnosis: Same   Procedure: 1.  right ureteroneocystotomy with Psoas Hitch 2. Left ureteral resection 3. left 6x24 JJ ureteral stent placement   Attending: Wilkie Aye, MD   Assistant: Algis Greenhouse, MD   Anesthesia: General   Estimated blood loss: 100 cc   Drains: 16 French Foley catheter, right 6x24 JJ ureteral stent   Specimens: none   Antibiotics: ancef   Findings: right mid ureteral injury below iliac vessels from ligasure.   Indications: Patient is a 67 year old with intraoperative right ureteral injury during sigmoid colectomy      Procedure in detail: Her genitalia and abdomen was then prepped and draped in the usual sterile fashion. The patient had a laparotomy incision and the right ureter was clearly identified with the injury below the iliac vessels.  The left ureter was identified and no injury was noted. We then proceeded to mobilize the bladder by freeing the anterior bladder wall from the pubic bone via blunt dissection. We then freed the right and left lateral bladder attachments to enable mobility for the bladder. We then filled the bladder with 400cc of normal saline but the ureter could not reach the bladder unless  a psoas hitch was performed.  We then used a 0 vicryl to hitch the right lateral wall of the bladder to the psoas tendon At this time the ureter was able to reach the dome of the bladder. We then mad a 1cm incision in the dome of the bladder. We then resected 1cm of ureter due to the cautery injury from the ligasure. We then spatulated the ureter. We passed a zip wire through the ureter and up to the renal pelvis. We then advanced a 6x24 JJ ureteral stent over the wire up to the renal pelvis. The wire was then removed. We then attached the ureter to the incision in the dome of the bladder using 3-0 vicryl in a running fashion. We then passed the remainder of the stent into the bladder and then  closed the distal aspect of the anastomosis with a running 3-0 vicryl. We then closed the bladder in a second layer using 0 vicryl.  A JP was placed into the pelvis. Dr Henreitta Leber proceeded to close the incision and this then concluded the procedure which was well tolerated by the patient.   Complications: None   Condition: Stable, x-rayed, transferred to PACU.   Plan: Patient is to be admitted for inpatient stay. The foley catheter will be removed in 14 days with a cystogram prior to removal. Stent removed in 6 weeks.

## 2023-01-24 NOTE — Consult Note (Addendum)
MEDICAL CONSULTATION NOTE   [facility]  GRATIA FARAR ZOX:[id number] DOB: 28-Apr-1955 DOA: 01/24/2023  PCP: Joana Reamer, DO  Patient coming from: Home  Level of care: Telemetry  I have personally briefly reviewed patient's old medical records in [facility]  Physician Requesting Consultation: Dr. Henreitta Leber   Reason for consultation: Evaluation and management recommendations for medical issues during postoperative recovery in hospital   HPI: Kelli Hurley is a 67 year old female with past medical history of hypertension, multiple myeloma in remission with no known evidence of cardiac amyloidosis per cardiac MRI 2021, morbid obesity on semaglutide, paroxysmal atrial flutter, acquired thrombophilia on apixaban, stage IIIb CKD, OSA on CPAP, h/o obstructive uropathy, hyperlipidemia, osteoarthritis, claustrophobia, chronic leukopenia, prediabetes, normocytic anemia, grade 2 diastolic heart dysfunction, mildly reduce LF function (50-55%) who presented for 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 Henreitta Leber and McKenzie.  The surgeons requested a medical consultation for management during postoperative recovery in hospital for next several days.       Past Medical History:  Diagnosis Date   Anemia    Arthritis    Asthma    Cancer (HCC)    Claustrophobia 10/05/2014   Diabetes mellitus without complication (HCC)    Dyspnea    Hypertension    Leukopenia 08/03/2014   Normocytic hypochromic anemia 08/03/2014   Renal disorder    stage 3    Sleep apnea     Past Surgical History:  Procedure Laterality Date   ABDOMINAL HYSTERECTOMY     CARDIAC SURGERY     40 months old. States she had a leaky valve.    COLONOSCOPY  03/26/2009   Dr. Darrick Penna; normal colon, small internal hemorrhoids.  Recommended repeat colonoscopy in 10 years.   COLONOSCOPY WITH PROPOFOL N/A 11/25/2019   Procedure: COLONOSCOPY WITH  PROPOFOL;  Surgeon: Lanelle Bal, DO;  Location: [facility];  Service: Endoscopy;  Laterality: N/A;  10:45am   IR PATIENT EVAL TECH 0-60 MINS  09/08/2022   IR URETERAL STENT PLACEMENT EXISTING ACCESS LEFT  11/24/2022   OTHER SURGICAL HISTORY     heart surgery as infant to "repair hole in heart"   PORTACATH PLACEMENT Left 02/21/2019   Procedure: INSERTION PORT-A-CATH;  Surgeon: Lucretia Roers, MD;  Location: [facility] ORS;  Service: General;  Laterality: Left;     reports that she has never smoked. She has never used smokeless tobacco. She reports that she does not drink alcohol and does not use drugs.  Allergies  Allergen Reactions   Diclofenac Swelling    Per pt, facial swelling  Other Reaction(s): Other (See Comments)  Per pt, facial swelling, Per pt, facial swelling    Family History  Problem Relation Age of Onset   Cancer Mother    Hypertension Mother    Cancer Father    Hypertension Father    Hypertension Sister    Hypertension Sister    Hypertension Sister    Hypertension Sister    Cancer Maternal Grandmother    Cancer Cousin    Hypertension Brother    Prostate cancer Brother    Hypertension Brother    Hypertension Brother    Colon cancer Neg Hx    Colon polyps Neg Hx     Prior to Admission medications   Medication Sig Start Date End Date Taking? Authorizing Provider  acetaminophen (TYLENOL) 650 MG CR tablet Take 650 mg by mouth every 8 (eight) hours as needed  for pain.   Yes [provider]  albuterol (VENTOLIN HFA) 108 (90 Base) MCG/ACT inhaler Inhale 2 puffs into the lungs every 6 (six) hours as needed for wheezing or shortness of breath. 02/18/20  Yes Doreatha Massed, MD  apixaban (ELIQUIS) 5 MG TABS tablet Take 1 tablet (5 mg total) by mouth 2 (two) times daily. 09/10/22  Yes Osvaldo Shipper, MD  calcitRIOL (ROCALTROL) 0.25 MCG capsule Take 0.25 mcg by mouth daily. 08/23/21  Yes [provider]  dexamethasone (DECADRON) 4 MG tablet TAKE  FIVE TABLETS BY MOUTH ONCE A WEEK 06/26/22  Yes Doreatha Massed, MD  furosemide (LASIX) 20 MG tablet Take 40 mg by mouth 2 (two) times daily.   Yes [provider]  Infant Care Products [facility]) OINT Apply to affected area as needed Externally for 30 days 08/08/21  Yes [provider]  lidocaine-prilocaine (EMLA) cream APPLY 1 QUARTER SIZED AMOUNT 1 HOUR PRIOR TO TREATMENT 04/10/22  Yes Doreatha Massed, MD  magnesium oxide (MAG-OX) 400 (240 Mg) MG tablet TAKE 1 TABLET BY MOUTH TWICE DAILY 07/12/21  Yes Doreatha Massed, MD  metolazone (ZAROXOLYN) 2.5 MG tablet Take 1 tablet (2.5 mg total) by mouth daily. Patient taking differently: Take 2.5 mg by mouth 3 (three) times a week. Mon., Wed., and Friday 11/29/21  Yes Doreatha Massed, MD  metroNIDAZOLE (FLAGYL) 500 MG tablet Take 2 tablets (1,000 mg total) by mouth as directed. Take 2 flagyl 500mg  tablets at 2 pm, 3pm, and 10 pm the day before surgery. 01/02/23  Yes Lucretia Roers, MD  Multiple Vitamin (TAB-A-VITE) TABS TAKE 1 TABLET BY MOUTH ONCE DAILY. Patient taking differently: Take 1 tablet by mouth daily. 04/10/17  Yes Hubbard Hartshorn, NP  neomycin (MYCIFRADIN) 500 MG tablet Take 2 tablets (1,000 mg total) by mouth as directed. Take 2 neomycin 500mg  tablets at 2 pm, 3pm, and 10 pm the day before surgery. 01/02/23  Yes Lucretia Roers, MD  oxyCODONE-acetaminophen (PERCOCET/ROXICET) 5-325 MG tablet Take 1 tablet by mouth every 12 (twelve) hours as needed for severe pain (pain score 7-10). TAKE 1 OR 2 TABLETS BY MOUTH EVERY TWELVE HOURS AS NEEDED for severe pain 01/11/23  Yes Doreatha Massed, MD  potassium chloride SA (KLOR-CON M) 20 MEQ tablet TAKE 1 TABLET BY MOUTH THREE TIMES DAILY Patient taking differently: Take 40 mEq by mouth daily. 04/26/22  Yes Doreatha Massed, MD  pregabalin (LYRICA) 75 MG capsule Take 1 capsule (75 mg total) by mouth 2 (two) times daily. 10/05/22  Yes Doreatha Massed, MD   rosuvastatin (CRESTOR) 10 MG tablet TAKE ONE TABLET BY MOUTH ONCE DAILY 10/26/22  Yes Chandrasekhar, Mahesh A, MD  trolamine salicylate (ASPERCREME) 10 % cream Apply 1 application topically 2 (two) times daily as needed for muscle pain.   Yes [provider]  VITAMIN D PO Take by mouth.   Yes [provider]  pomalidomide (POMALYST) 4 MG capsule Take 1 capsule (4 mg total) by mouth daily. 21 days on, 7 days off 01/17/23   Doreatha Massed, MD  Semaglutide, 2 MG/DOSE, (OZEMPIC, 2 MG/DOSE,) 8 MG/3ML SOPN Inject 2 mg into the skin once a week. 08/31/22   Meriam Sprague, MD    Physical Exam: Vitals:   01/24/23 1415 01/24/23 1430 01/24/23 1500 01/24/23 1526  BP: 105/68 100/68 119/73 101/64  Pulse: 78 78 79 78  Resp: 17 11 12 14   Temp:   (!) 97.5 F (36.4 C) 97.6 F (36.4 C)  TempSrc:    Oral  SpO2: 97% 100% 100% 100%  Weight:      Height:        Constitutional: pt seen immediately postop, says she feels well, a little sluggish but mentating good; NAD, calm, comfortable Eyes: PERRL, lids and conjunctivae normal ENMT: Mucous membranes are moist. Posterior pharynx clear of any exudate or lesions.  Neck: normal, supple, no masses, no thyromegaly Respiratory: clear to auscultation bilaterally, no wheezing, no crackles. Normal respiratory effort. No accessory muscle use.  Cardiovascular: normal s1, s2 sounds, no murmurs / rubs / gallops. No extremity edema. 2+ pedal pulses. No carotid bruits.  Abdomen: no tenderness, no masses palpated. No hepatosplenomegaly. Bowel sounds positive.  Musculoskeletal: no clubbing / cyanosis. No joint deformity upper and lower extremities. Good ROM, no contractures. Normal muscle tone.  Skin: no rashes, lesions, ulcers. No induration Neurologic: CN 2-12 grossly intact. Sensation intact, DTR normal. Strength 5/5 in all 4.  Psychiatric: Normal judgment and insight. Alert and oriented x 3. Normal mood.   Labs on Admission: I have  personally reviewed following labs and imaging studies  CBC: Recent Labs  Lab 01/19/23 1045  WBC 3.8*  NEUTROABS 2.0  HGB 9.9*  HCT 30.3*  MCV 92.9  PLT 235   Basic Metabolic Panel: Recent Labs  Lab 01/19/23 1045  NA 137  K 3.7  CL 95*  CO2 29  GLUCOSE 98  BUN 58*  CREATININE 2.26*  CALCIUM 9.6   GFR: Estimated Creatinine Clearance: 32.3 mL/min (A) (by C-G formula based on SCr of 2.26 mg/dL (H)). Liver Function Tests: No results for input(s): "AST", "ALT", "ALKPHOS", "BILITOT", "PROT", "ALBUMIN" in the last 168 hours. No results for input(s): "LIPASE", "AMYLASE" in the last 168 hours. No results for input(s): "AMMONIA" in the last 168 hours. Coagulation Profile: No results for input(s): "INR", "PROTIME" in the last 168 hours. Cardiac Enzymes: No results for input(s): "CKTOTAL", "CKMB", "CKMBINDEX", "TROPONINI" in the last 168 hours. BNP (last 3 results) No results for input(s): "PROBNP" in the last 8760 hours. HbA1C: No results for input(s): "HGBA1C" in the last 72 hours. CBG: Recent Labs  Lab 01/24/23 1344  GLUCAP 174*   Lipid Profile: No results for input(s): "CHOL", "HDL", "LDLCALC", "TRIG", "CHOLHDL", "LDLDIRECT" in the last 72 hours. Thyroid Function Tests: No results for input(s): "TSH", "T4TOTAL", "FREET4", "T3FREE", "THYROIDAB" in the last 72 hours. Anemia Panel: No results for input(s): "VITAMINB12", "FOLATE", "FERRITIN", "TIBC", "IRON", "RETICCTPCT" in the last 72 hours. Urine analysis:    Component Value Date/Time   COLORURINE YELLOW 09/04/2022 0200   APPEARANCEUR Clear 11/27/2022 1535   LABSPEC 1.005 09/04/2022 0200   PHURINE 6.0 09/04/2022 0200   GLUCOSEU Negative 11/27/2022 1535   HGBUR MODERATE (A) 09/04/2022 0200   BILIRUBINUR Negative 11/27/2022 1535   KETONESUR NEGATIVE 09/04/2022 0200   PROTEINUR 1+ (A) 11/27/2022 1535   PROTEINUR 30 (A) 09/04/2022 0200   NITRITE Negative 11/27/2022 1535   NITRITE NEGATIVE 09/04/2022 0200    LEUKOCYTESUR 1+ (A) 11/27/2022 1535   LEUKOCYTESUR LARGE (A) 09/04/2022 0200   Radiological Exams on Admission: No results found.  EKG: Independently reviewed. SR with 1st degree AVB  Assessment/Plan Principal Problem:   Colovaginal fistula Active Problems:   Leukopenia   Multiple myeloma (HCC)   Claustrophobia   Morbid obesity with BMI of 50.0-59.9, adult (HCC)   Nocturnal hypoxemia   H/O autologous stem cell transplant (HCC)   Essential hypertension   CKD (chronic kidney disease) stage 3, GFR 30-59 ml/min (HCC)   Anemia in neoplastic disease  Heart failure with preserved ejection fraction (HCC)   OSA (obstructive sleep apnea)   Paroxysmal atrial flutter (HCC)   Impression/Recommendations  Postop s/p sigmoid colectomy, end colostomy, with right ureter repair on 01/24/23 - postop management orders per Dr. Henreitta Leber  - discussed with Dr. Henreitta Leber, Foley cath to remain in place  - ostomy nurse to begin education on 12/19.    Paroxysmal atrial flutter Acquired thrombophilia  - currently not requiring AV nodal agents - HR currently controlled - holding apixaban now given recent surgery - Dr. Henreitta Leber will determine when safe to restart full anticoagulation  Normocytic anemia  Anemia in neoplastic disease - hg 9.9 and stable - recheck CBC in AM  - holding apixaban at this time  OSA on CPAP - ordered for nightly CPAP  - RT to provide auto-titrated CPAP  Chronic HFpEF with chronic lower extremity edema - appears compensated - no symptoms of acute exacerbation  - temporarily holding home lasix - resumed home metolazone 3 times weekly - likely can resume lasix 40 mg BID when eating/drinking better  Morbid obesity  - she is on weekly semaglutide - monitor for signs of slow gastric emptying and food intolerance - IV pantoprazole ordered every evening for GI protection  Hyperlipidemia - resumed home rosuvastatin 10 mg daily   Neuropathy  - resumed home pregabalin    Relapsed IgG kappa Multiple myeloma with high risk features - temporarily holding pomalyst and dexamethasone 1 week prior and 2 weeks after surgery per Dr. Ellin Saba   Bilateral knee pain - chronic  - pain medication as ordered for symptomatic treatment  Still waiting on home medications to be reconciled by pharmacy.   DVT prophylaxis: SCDs   Code Status: Full   Family Communication:  none present   Level of care: Telemetry Standley Dakins MD [facility] How to contact the [facility] Attending or Consulting provider 7A - 7P or covering provider during after hours 7P -7A, for this patient?  Check the care team in [facility] and look for a) attending/consulting [facility] provider listed and b) the [facility] team listed Log into [email or web address] and use [place]'s universal password to access. If you do not have the password, please contact the hospital operator. Locate the [facility] provider you are looking for under [facility] and page to a number that you can be directly reached. If you still have difficulty reaching the provider, please page the [facility] (Director on Call) for the Hospitalists listed on amion for assistance.   If 7PM-7AM, please contact night-coverage [email or web address] Password [facility]  01/24/2023, 3:33 PM

## 2023-01-24 NOTE — Progress Notes (Addendum)
[facility]  Updated family. End colostomy, colovaginal fistula with ureters involved. Right ureter injured and repaired intraoperative by Dr. Ronne Binning. Colostomy likely permanent given the pelvis, fibrosis /scarring and ureter repair. Also discussed risk of ostomy stenosis or retraction given her abdominal wall thickness.    PRN for pain Tylenol scheduled IS,OOB Telemetry Home meds except for multiple myeloma medications ordered Clear diet SSI ordered  Ostomy care JP drain over bladder/ureter repair Foley in place for 2 weeks  McKenzie will follow up with her  Labs in AM Cefotetan for a few dose post op 600 cc of EBL, monitor H&H SCDs, holding on heparin/ lovenox for now  Hospitalist for help with medical issues with complex patient   Likely here through weekend. Family aware Lovell Sheehan seeing over weekend.  Algis Greenhouse, MD [facility] [address], [place] [address] [phone] (office)

## 2023-01-24 NOTE — Interval H&P Note (Signed)
History and Physical Interval Note:  01/24/2023 8:08 AM  Kelli Hurley  has presented today for surgery, with the diagnosis of RECTOVAGINAL FISTULA.  The various methods of treatment have been discussed with the patient and family. After consideration of risks, benefits and other options for treatment, the patient has consented to  Procedure(s): PARTIAL COLECTOMY WITH POSSIBLE COLOSTOMY CREATION/HARTMANN PROCEDURE (N/A) as a surgical intervention.  The patient's history has been reviewed, patient examined, no change in status, stable for surgery.  I have reviewed the patient's chart and labs.  Questions were answered to the patient's satisfaction.     Lucretia Roers

## 2023-01-24 NOTE — Transfer of Care (Signed)
Immediate Anesthesia Transfer of Care Note  Patient: Kelli Hurley  Procedure(s) Performed: EXPLORATORY LAPAROTOMY, SIGMOID COLECTOMY WITH END COLOSTOMY (Abdomen) RIGHT URETERAL STENT PLACEMENT; RIGHT URETERONEOCYSTOTOMY WITH PSOAS HITCH (Right: Ureter)  Patient Location: PACU  Anesthesia Type:General  Level of Consciousness: awake, alert , oriented, and patient cooperative  Airway & Oxygen Therapy: Patient Spontanous Breathing and Patient connected to face mask oxygen  Post-op Assessment: Report given to RN, Post -op Vital signs reviewed and stable, and Patient moving all extremities X 4  Post vital signs: Reviewed and stable  Last Vitals:  Vitals Value Taken Time  BP 111/76 01/24/23 1345  Temp    Pulse 79 01/24/23 1346  Resp 12 01/24/23 1346  SpO2 100 % 01/24/23 1346  Vitals shown include unfiled device data.  Last Pain:  Vitals:   01/24/23 0744  TempSrc: Oral  PainSc: 0-No pain      Patients Stated Pain Goal: 3 (01/24/23 0744)  Complications: No notable events documented.

## 2023-01-24 NOTE — Op Note (Signed)
[facility] Operative Note  01/24/23  Preoperative Diagnosis: Colovaginal fistula    Postoperative Diagnosis: Colovaginal fistula, right ureter injury (see Urology Operative note)   Procedure(s) Performed: Sigmoid colectomy, end colostomy (see Urology, Dr. Dimas Millin note for ureter repair, psoas hitch)    Surgeon: Leatrice Jewels. Henreitta Leber, MD   Assistants: Franky Macho, MD No   Anesthesia: General endotracheal   Anesthesiologist: Dr. Johnnette Litter, MD    Specimens: Sigmoid colon, additional descending colon    Estimated Blood Loss: Minimal   Blood Replacement: None    Complications: None   Wound Class: Contaminated    Operative Indications: 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 She has been following with Urology and given the stent in the ureter and scarring of the ureter into the surrounding tissue of the colovaginal fistula it was felt that she would have to undergo resection. We discussed the resection, likely colostomy, potential colostomy would be permanent, concern that she could have ureter injury and I spoke with Gyn and Urology so those service lines were available pending any potential issues. We discussed risk of bleeding, infection, and timed all of her medications to be held appropriately including her multiple myeloma treatment, Ozempic and Eliquis. I marked her for potential colostomy in the preoperative area trying to avoid any abdominal creases or folds.   Findings: Upon entering the abdomen (organ space), I encountered a phlegmon involving the distal sigmoid colon and the pelvic side wall on the left with extensive scarring and fibrosis .    Procedure: The patient was taken to the operating room and placed supine. General endotracheal anesthesia was induced. Intravenous antibiotics were  administered per protocol.  An orogastric tube positioned to decompress the  stomach. She was then placed in lithotomy with all pressure points padded and a foley catheter was placed.   The abdomen and perineal/ perianal region were prepped and draped in the usual sterile fashion.   A midline incision was made and carried down to the fascia. With care the fascia was entered and a wound protector was placed. The patient was placed in slight Trendelenburg position.  The small bowel and cecum were packed in to the right upper quadrant. Some minor adhesion of the small bowel down to the pelvis were taken down with scissors.  The sigmoid colon was identified and upon entering the abdomen (organ space), I encountered a phlegmon involving the distal sigmoid colon and the pelvic side wall on the left with extensive scarring and fibrosis. The left ureter had a known stent in place, and the CT was up during the entire case for review. With great care the sigmoid colon was carefully dissected off the left pelvic side wall, identifying and protecting the left ureter and stent which was felt. This was done with blunt dissection. To help with further mobilization, I identified a point on the proximal colon and came across it with a 75 mm linear cutting stapler, ensuring no ureter was involved. Then I took the mesentery down the resected colon a few inches to allow for better retraction and mobilization up into the field given the patient's body habitus and depth of the pelvis.  When taking the mesentery I hugged the colon edge, taking the mesentery in small bites.  This was done and allow for more blunt dissection of the left pelvic side wall and blunt dissection of the sigmoid colon and rectum  from the pelvic tissue and redundant peritoneal lining. She had scarring anterior to the sigmoid colon from her prior hysterectomy.  We were able to get around the rectum distally and took this with a contour TA 60 mm stapler. Again, noting that the left ureter with stent in it was lateral and safe.  We then took  the remaining mesentery with the ligasure coming through in layers from the inferior end upward and hugging the mesenteric boarder to the colon.  After the colon was resected, we investigated our field to again verify out left ureter and ensure no bleeding. Irrigation was performed. On this inspection, we noted a blind ending tubular structure that then had peristalses.   On further investigation this was the right ureter. This had been pulled medially into the retracted scarring and fibrosis of the sigmoid colon, and was inadvertently ligated with the ligasure despite all of our precautions and careful dissection. I immediately called for Urology and Dr. Ronne Binning came straight to the OR.   See Dr. Dimas Millin note for the right ureter repair with stent, and psoas hitch to the bladder.  While Dr. Ronne Binning was there, the left colon was mobilized further along the white line of Toldt to ensure that the left ureter was safe. He identified the ureter and stent and noted that this was deep in the field and medial to where this mobilization was being done. I then had to take down the splenic flexure with the ligasure and mobilized the descending colon and splenic flexure.   Her mesentery was short and her abdominal wall was over 8cm deep. The distal descending colon would not reach as well as the mid descending. A linear cutting 75 mm stapler was used to take the distal 4cm and the ligasure was used at the mesenteric edge to ligate the mesentery. The left ureter again was down and protected as well as her right ureter and bladder repair.   A JP drain was placed through the right lower quadrant and placed over the bladder right ureter repair at Dr. Dimas Millin request. This was secured with 3-0 Nylon suture.   The small bowel was ran and no injuries were noted and it was placed back into the abdomen in anatomic position. All laparotomy pads were removed. While retracting the fascia to the midline a circular  incision was made in the skin at the location of my prior colostomy mark.  I then carried this down through the subcutaneous tissue to the fascia. Again the abdominal wall was over 8cm thick, and a cruciate incision was made in the fascia and two fingerbreadths passed through. The end of the colon was brought up without any twisting or under tension.    The midline was closed with 0 PDS in the standard fashion. Marcaine was injected. Irrigation was performed. The dead space was closed with 3-0 Vicryl suture interrupted.  The skin was closed with staples and a honeycomb dressing was placed.   The colostomy was matured in the standard fashion with 3-0 Chromic gut suture. The colostomy was digitized and was patent and straight. A wafer and bag were placed.   Dr. Lovell Sheehan was assisting throughout the procedure and was present for the critical portions of the case including mobilization and dissection. Dr. Ronne Binning came into help with the ureter repair and then Dr. Lovell Sheehan came back to help with the closure and colostomy maturation.    Final inspection revealed acceptable hemostasis. All counts were correct at the end of the case.  The patient was awakened from anesthesia and extubated without complication.  Her foley was placed back to a foley bag and the JP bulb was placed. The patient went to the PACU in stable condition.    Kelli Greenhouse, MD [facility] [address] [place], [place] [address] [phone] (office)

## 2023-01-24 NOTE — OR Nursing (Signed)
Updated patient's daughter per Dr. Henreitta Leber. Informed her that urology had left the room and patient is still doing well.

## 2023-01-25 ENCOUNTER — Other Ambulatory Visit: Payer: Self-pay | Admitting: *Deleted

## 2023-01-25 DIAGNOSIS — Z6841 Body Mass Index (BMI) 40.0 and over, adult: Secondary | ICD-10-CM

## 2023-01-25 DIAGNOSIS — I4892 Unspecified atrial flutter: Secondary | ICD-10-CM | POA: Diagnosis not present

## 2023-01-25 DIAGNOSIS — N824 Other female intestinal-genital tract fistulae: Secondary | ICD-10-CM

## 2023-01-25 DIAGNOSIS — I10 Essential (primary) hypertension: Secondary | ICD-10-CM

## 2023-01-25 DIAGNOSIS — Z7189 Other specified counseling: Secondary | ICD-10-CM

## 2023-01-25 LAB — CBC WITH DIFFERENTIAL/PLATELET
Abs Immature Granulocytes: 0.18 10*3/uL — ABNORMAL HIGH (ref 0.00–0.07)
Basophils Absolute: 0 10*3/uL (ref 0.0–0.1)
Basophils Relative: 0 %
Eosinophils Absolute: 0 10*3/uL (ref 0.0–0.5)
Eosinophils Relative: 0 %
HCT: 24.8 % — ABNORMAL LOW (ref 36.0–46.0)
Hemoglobin: 8 g/dL — ABNORMAL LOW (ref 12.0–15.0)
Immature Granulocytes: 2 %
Lymphocytes Relative: 11 %
Lymphs Abs: 1.3 10*3/uL (ref 0.7–4.0)
MCH: 30 pg (ref 26.0–34.0)
MCHC: 32.3 g/dL (ref 30.0–36.0)
MCV: 92.9 fL (ref 80.0–100.0)
Monocytes Absolute: 1.4 10*3/uL — ABNORMAL HIGH (ref 0.1–1.0)
Monocytes Relative: 13 %
Neutro Abs: 8.3 10*3/uL — ABNORMAL HIGH (ref 1.7–7.7)
Neutrophils Relative %: 74 %
Platelets: 250 10*3/uL (ref 150–400)
RBC: 2.67 MIL/uL — ABNORMAL LOW (ref 3.87–5.11)
RDW: 15.3 % (ref 11.5–15.5)
WBC: 11.2 10*3/uL — ABNORMAL HIGH (ref 4.0–10.5)
nRBC: 0 % (ref 0.0–0.2)

## 2023-01-25 LAB — GLUCOSE, CAPILLARY
Glucose-Capillary: 149 mg/dL — ABNORMAL HIGH (ref 70–99)
Glucose-Capillary: 145 mg/dL — ABNORMAL HIGH (ref 70–99)
Glucose-Capillary: 139 mg/dL — ABNORMAL HIGH (ref 70–99)
Glucose-Capillary: 141 mg/dL — ABNORMAL HIGH (ref 70–99)

## 2023-01-25 LAB — BASIC METABOLIC PANEL
Anion gap: 11 (ref 5–15)
BUN: 30 mg/dL — ABNORMAL HIGH (ref 8–23)
CO2: 25 mmol/L (ref 22–32)
Calcium: 8.9 mg/dL (ref 8.9–10.3)
Chloride: 96 mmol/L — ABNORMAL LOW (ref 98–111)
Creatinine, Ser: 2.46 mg/dL — ABNORMAL HIGH (ref 0.44–1.00)
GFR, Estimated: 21 mL/min — ABNORMAL LOW (ref >60)
Glucose, Bld: 160 mg/dL — ABNORMAL HIGH (ref 70–99)
Potassium: 4.1 mmol/L (ref 3.5–5.1)
Sodium: 132 mmol/L — ABNORMAL LOW (ref 135–145)

## 2023-01-25 LAB — PHOSPHORUS: Phosphorus: 5.9 mg/dL — ABNORMAL HIGH (ref 2.5–4.6)

## 2023-01-25 LAB — MAGNESIUM: Magnesium: 1.7 mg/dL (ref 1.7–2.4)

## 2023-01-25 MED ORDER — MAGNESIUM SULFATE 2 GM/50ML IV SOLN
2.0000 g | Freq: Once | INTRAVENOUS | Status: AC
  Administered 2023-01-25: 2 g via INTRAVENOUS

## 2023-01-25 NOTE — Progress Notes (Signed)
PROGRESS NOTE  Kelli Hurley:[id number] DOB: 1955-08-11 DOA: 01/24/2023 PCP: Joana Reamer, DO  Brief History:  67 year old female with past medical history of hypertension, multiple myeloma in remission with no known evidence of cardiac amyloidosis per cardiac MRI 2021, morbid obesity on semaglutide, paroxysmal atrial flutter, acquired thrombophilia on apixaban, stage IIIb CKD, OSA on CPAP, h/o obstructive uropathy, hyperlipidemia, osteoarthritis, claustrophobia, chronic leukopenia, prediabetes, normocytic anemia, grade 2 diastolic heart dysfunction, mildly reduce LF function (50-55%) who presented for 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 Henreitta Leber and McKenzie.  The surgeons requested a medical consultation for management during postoperative recovery in hospital for next several days.      Assessment/Plan:  Colovaginal Postop s/p sigmoid colectomy, end colostomy, with right ureter repair on 01/24/23 - postop management orders per Dr. Henreitta Leber  - discussed with Dr. Henreitta Leber, Foley cath to remain in place  - ostomy nurse to begin education on 12/19.     Paroxysmal atrial flutter Acquired thrombophilia  - currently not requiring AV nodal agents - HR currently controlled - restart apixaban when ok with surgery - Dr. Henreitta Leber will determine when safe to restart full anticoagulation -currently in sinus  CKD 4 -baseline creatinne 1.9-2.2 -monitor BMP  Diabetes mellitus type 2, controlled with hyperglycemia -01/19/23 A1C--6.3 -Ozempic q week PTA   Normocytic anemia  Anemia in neoplastic disease - hgb 9.9 and stable - baseline Hgb 9 - holding apixaban at this time -drop in Hgb may be partly dilution   OSA on CPAP - ordered for nightly CPAP  - RT to provide auto-titrated CPAP   Chronic HFpEF with chronic lower extremity edema - clinically compensated - 09/06/22 Echo  EF 66-55%, G1DD, normal  RVF, mild MR - temporarily holding home lasix - resumed home metolazone 3 times weekly - likely can resume lasix 40 mg BID when eating/drinking better   Morbid obesity  - she is on weekly semaglutide -BMI 53.96 - monitor for signs of slow gastric emptying and food intolerance - IV pantoprazole ordered every evening for GI protection   Hyperlipidemia - resumed home rosuvastatin 10 mg daily    Relapsed IgG kappa Multiple myeloma with high risk features - temporarily holding pomalyst and dexamethasone 1 week prior and 2 weeks after surgery per Dr. Ellin Saba    Bilateral knee pain - chronic  and Opioid dependence - pain medication as ordered for symptomatic treatment - PDMP reviewed -percocet 5/325, #60, refill on 01/16/23 and 12/11/22 -lyirica 75 mg, #60, refill on 01/08/23 and 12/05/22         Family Communication:   Family at bedside updated 12/19  Consultants:  N/A  Code Status:  FULL   DVT Prophylaxis:  SCDs  Procedures: As Listed in Progress Note Above  Antibiotics: cefotetan      Subjective:   Objective: Vitals:   01/24/23 2032 01/24/23 2043 01/25/23 0008 01/25/23 0431  BP: 97/65  99/62 98/77  Pulse: 91  85 79  Resp:  14    Temp: 98 F (36.7 C)  98.3 F (36.8 C) 97.6 F (36.4 C)  TempSrc: Oral  Oral Oral  SpO2: 98%  99% 100%  Weight:      Height:        Intake/Output Summary (Last 24 hours) at 01/25/2023 1211 Last data filed at 01/25/2023 0900 Gross per 24 hour  Intake 2071.25 ml  Output 750 ml  Net  1321.25 ml   Weight change:  Exam:  General:  Pt is alert, follows commands appropriately, not in acute distress HEENT: No icterus, No thrush, No neck mass, [place]/AT Cardiovascular: RRR, S1/S2, no rubs, no gallops Respiratory: CTA bilaterally, no wheezing, no crackles, no rhonchi Abdomen: Soft/+BS, non tender, non distended, no guarding Extremities: No edema, No lymphangitis, No petechiae, No rashes, no synovitis   Data Reviewed: I have  personally reviewed following labs and imaging studies Basic Metabolic Panel: Recent Labs  Lab 01/19/23 1045 01/25/23 0402  NA 137 132*  K 3.7 4.1  CL 95* 96*  CO2 29 25  GLUCOSE 98 160*  BUN 58* 30*  CREATININE 2.26* 2.46*  CALCIUM 9.6 8.9  MG  --  1.7  PHOS  --  5.9*   Liver Function Tests: No results for input(s): "AST", "ALT", "ALKPHOS", "BILITOT", "PROT", "ALBUMIN" in the last 168 hours. No results for input(s): "LIPASE", "AMYLASE" in the last 168 hours. No results for input(s): "AMMONIA" in the last 168 hours. Coagulation Profile: No results for input(s): "INR", "PROTIME" in the last 168 hours. CBC: Recent Labs  Lab 01/19/23 1045 01/25/23 0402  WBC 3.8* 11.2*  NEUTROABS 2.0 8.3*  HGB 9.9* 8.0*  HCT 30.3* 24.8*  MCV 92.9 92.9  PLT 235 250   Cardiac Enzymes: No results for input(s): "CKTOTAL", "CKMB", "CKMBINDEX", "TROPONINI" in the last 168 hours. BNP: Invalid input(s): "POCBNP" CBG: Recent Labs  Lab 01/24/23 1344 01/24/23 1620 01/24/23 2037 01/25/23 0709 01/25/23 1118  GLUCAP 174* 177* 178* 149* 145*   HbA1C: No results for input(s): "HGBA1C" in the last 72 hours. Urine analysis:    Component Value Date/Time   COLORURINE YELLOW 09/04/2022 0200   APPEARANCEUR Clear 11/27/2022 1535   LABSPEC 1.005 09/04/2022 0200   PHURINE 6.0 09/04/2022 0200   GLUCOSEU Negative 11/27/2022 1535   HGBUR MODERATE (A) 09/04/2022 0200   BILIRUBINUR Negative 11/27/2022 1535   KETONESUR NEGATIVE 09/04/2022 0200   PROTEINUR 1+ (A) 11/27/2022 1535   PROTEINUR 30 (A) 09/04/2022 0200   NITRITE Negative 11/27/2022 1535   NITRITE NEGATIVE 09/04/2022 0200   LEUKOCYTESUR 1+ (A) 11/27/2022 1535   LEUKOCYTESUR LARGE (A) 09/04/2022 0200   Sepsis Labs: @LABRCNTIP (procalcitonin:4,lacticidven:4) )No results found for this or any previous visit (from the past 240 hours).   Scheduled Meds:  acetaminophen  1,000 mg Oral Q6H   Chlorhexidine Gluconate Cloth  6 each Topical Daily    docusate sodium  100 mg Oral BID   insulin aspart  0-15 Units Subcutaneous TID WC   [START ON 01/26/2023] metolazone  2.5 mg Oral Once per day on Monday Wednesday Friday   pantoprazole (PROTONIX) IV  40 mg Intravenous QHS   pregabalin  75 mg Oral BID   rosuvastatin  10 mg Oral Daily   Continuous Infusions:  cefoTEtan (CEFOTAN) IV 2 g (01/25/23 1127)   lactated ringers 75 mL/hr at 01/24/23 1752    Procedures/Studies: CT ABDOMEN PELVIS WO CONTRAST Result Date: 01/02/2023 CLINICAL DATA:  Rectovaginal fistula resulting in left hydronephrosis status post ureteral stent placement EXAM: CT ABDOMEN AND PELVIS WITHOUT CONTRAST TECHNIQUE: Multidetector CT imaging of the abdomen and pelvis was performed following the standard protocol without IV contrast. RADIATION DOSE REDUCTION: This exam was performed according to the departmental dose-optimization program which includes automated exposure control, adjustment of the mA and/or kV according to patient size and/or use of iterative reconstruction technique. COMPARISON:  CT abdomen and pelvis dated 09/04/2022 FINDINGS: Lower chest: Bilateral lower lobe mild bronchiectasis. No  pleural effusion or pneumothorax demonstrated. Partially imaged heart size is normal. Hepatobiliary: No focal hepatic lesions. No intra or extrahepatic biliary ductal dilation. Normal gallbladder. Pancreas: No focal lesions or main ductal dilation. Spleen: Normal in size without focal abnormality. Adrenals/Urinary Tract: No adrenal nodules. Interval removal of percutaneous left nephrostomy tube. Left ureteral stent in-situ with interval resolution of left hydronephrosis. Interval slight atrophy of the left kidney. No suspicious renal masses by noncontrast technique. No calculi. No focal bladder wall thickening. Stomach/Bowel: Normal appearance of the stomach. No evidence of bowel wall thickening, distention, or inflammatory changes. Colonic diverticulosis without acute diverticulitis.  Normal appendix. Vascular/Lymphatic: Aortic atherosclerosis. No enlarged abdominal or pelvic lymph nodes. Reproductive: Hysterectomy. Again seen is left adnexal tethering of the vaginal cuff, left ureter, and adjacent rectum and sigmoid colon. Gas-filled fistulous tract extending superiorly from the vaginal cuff is again seen, where there was contrast previously (5:95). Other: No free fluid, fluid collection, or free air. Musculoskeletal: No acute or abnormal lytic or blastic osseous lesions. Multilevel degenerative changes of the partially imaged thoracic and lumbar spine. Small fat-containing paraumbilical hernia. IMPRESSION: 1. Interval removal of percutaneous left nephrostomy tube. Left ureteral stent in-situ with interval resolution of left hydronephrosis. 2. Again seen is left adnexal tethering of the vaginal cuff, left ureter, and adjacent rectum and sigmoid colon. Gas-filled fistulous tract extending superiorly from the vaginal cuff is again seen, where there was contrast previously. 3. Colonic diverticulosis without acute diverticulitis. 4.  Aortic Atherosclerosis (ICD10-I70.0). Electronically Signed   By: Agustin Cree M.D.   On: 01/02/2023 11:39   MM 3D SCREENING MAMMOGRAM BILATERAL BREAST Result Date: 12/29/2022 CLINICAL DATA:  Screening. Technologist indicates best positioning/images possible. EXAM: DIGITAL SCREENING BILATERAL MAMMOGRAM WITH TOMOSYNTHESIS AND CAD TECHNIQUE: Bilateral screening digital craniocaudal and mediolateral oblique mammograms were obtained. Bilateral screening digital breast tomosynthesis was performed. The images were evaluated with computer-aided detection. COMPARISON:  Outside screening mammogram 09/13/2020, 12/04/2018, 06/11/2017 ACR Breast Density Category c: The breasts are heterogeneously dense, which may obscure small masses. FINDINGS: There are no findings suspicious for malignancy. IMPRESSION: No mammographic evidence of malignancy. A result letter of this screening  mammogram will be mailed directly to the patient. RECOMMENDATION: Screening mammogram in one year. (Code:SM-B-01Y) BI-RADS CATEGORY  1: Negative. Electronically Signed   By: Sherron Ales M.D.   On: 12/29/2022 12:42    Catarina Hartshorn, DO  [facility]  If 7PM-7AM, please contact night-coverage [email or web address] Password [email or web address] 01/25/2023, 12:11 PM   LOS: 1 day

## 2023-01-25 NOTE — Plan of Care (Signed)
  Problem: Education: Goal: Understanding of discharge needs will improve Outcome: Progressing Goal: Verbalization of understanding of the causes of altered bowel function will improve Outcome: Progressing

## 2023-01-25 NOTE — Progress Notes (Signed)
Mobility Specialist Progress Note:    01/25/23 1048  Mobility  Activity Ambulated with assistance in room;Transferred from bed to chair  Level of Assistance Maximum assist, patient does 25-49%  Assistive Device Front wheel walker  Distance Ambulated (ft) 8 ft  Range of Motion/Exercises Active;All extremities  Activity Response Tolerated fair  Mobility Referral Yes  Mobility visit 1 Mobility  Mobility Specialist Start Time (ACUTE ONLY) 0950  Mobility Specialist Stop Time (ACUTE ONLY) 1035  Mobility Specialist Time Calculation (min) (ACUTE ONLY) 45 min   Pt received in bed, family in room. Agreeable to mobility, required MaxA to stand and ambulate with RW. Tolerated fair, pt weak and leans anteriorly. Pt could not hold themself up near EOS. Required extra assistance to get pt in chair. Left pt with nursing staff, all needs met.   Lawerance Bach Mobility Specialist Please contact via Special educational needs teacher or  Rehab office at [phone]

## 2023-01-25 NOTE — Evaluation (Signed)
Physical Therapy Evaluation Patient Details Name: Kelli Hurley MRN: [id number] DOB: 01/16/1956 Today's Date: 01/25/2023  History of Present Illness  Kelli Hurley is a 67 y/o female, s/p Performed: Sigmoid colectomy, end colostomy (see Urology, Dr. Dimas Millin note for ureter repair, psoas hitc on 01/24/23 with the diagnosis of RECTOVAGINAL FISTULA.   Clinical Impression  Patient demonstrates fair/good return for rolling to side and sitting up from side lying position using bed rail with HOB flat.  Patient had difficulty completing sit to stands and limited to a few steps forward/backwards at bedside before having to sit due to c/o fatigue.  Patient tolerated sitting up in chair after therapy with family members present.  Patient will benefit from continued skilled physical therapy in hospital and recommended venue below to increase strength, balance, endurance for safe ADLs and gait.          If plan is discharge home, recommend the following: A lot of help with bathing/dressing/bathroom;A little help with bathing/dressing/bathroom;Help with stairs or ramp for entrance;Assistance with cooking/housework   Can travel by private vehicle        Equipment Recommendations None recommended by PT;BSC/3in1  Recommendations for Other Services       Functional Status Assessment Patient has had a recent decline in their functional status and demonstrates the ability to make significant improvements in function in a reasonable and predictable amount of time.     Precautions / Restrictions Precautions Precautions: Fall Restrictions Weight Bearing Restrictions Per Provider Order: No      Mobility  Bed Mobility Overal bed mobility: Needs Assistance Bed Mobility: Rolling, Sidelying to Sit Rolling: Contact guard assist, Min assist Sidelying to sit: Min assist       General bed mobility comments: increased time, labored movement with good return for log rolling to side and  sitting up from side lying position using bed rail with HOB flat    Transfers Overall transfer level: Needs assistance Equipment used: Rolling walker (2 wheels) Transfers: Sit to/from Stand, Bed to chair/wheelchair/BSC Sit to Stand: Min assist, Mod assist   Step pivot transfers: Min assist, Mod assist       General transfer comment: slow labored movement    Ambulation/Gait Ambulation/Gait assistance: Mod assist Gait Distance (Feet): 8 Feet Assistive device: Rolling walker (2 wheels) Gait Pattern/deviations: Decreased step length - right, Decreased step length - left, Decreased stride length Gait velocity: slow     General Gait Details: limited to a few side steps and steps forward/backwards due to fatigue and BLE weakness  Stairs            Wheelchair Mobility     Tilt Bed    Modified Rankin (Stroke Patients Only)       Balance Overall balance assessment: Needs assistance Sitting-balance support: Feet supported, No upper extremity supported Sitting balance-Leahy Scale: Good Sitting balance - Comments: seated at bedside   Standing balance support: Reliant on assistive device for balance, During functional activity, Bilateral upper extremity supported Standing balance-Leahy Scale: Poor Standing balance comment: fair/poor using RW                             Pertinent Vitals/Pain Pain Assessment Pain Assessment: No/denies pain    Home Living Family/patient expects to be discharged to:: Private residence Living Arrangements: Children Available Help at Discharge: Family;Available 24 hours/day Type of Home: House Home Access: Stairs to enter;Ramped entrance   Entrance Stairs-Number of Steps: 3-5  Home Layout: One level Home Equipment: Agricultural consultant (2 wheels);Cane - single point;Wheelchair - manual      Prior Function Prior Level of Function : Independent/Modified Independent;Driving             Mobility Comments: Education administrator, drives ADLs Comments: doses not get into shower, sponge bath sitting, assisted by family     Extremity/Trunk Assessment   Upper Extremity Assessment Upper Extremity Assessment: Overall WFL for tasks assessed    Lower Extremity Assessment Lower Extremity Assessment: Generalized weakness    Cervical / Trunk Assessment Cervical / Trunk Assessment: Normal  Communication   Communication Communication: No apparent difficulties Cueing Techniques: Verbal cues;Tactile cues  Cognition Arousal: Alert Behavior During Therapy: WFL for tasks assessed/performed Overall Cognitive Status: Within Functional Limits for tasks assessed                                          General Comments      Exercises     Assessment/Plan    PT Assessment Patient needs continued PT services  PT Problem List Decreased strength;Decreased activity tolerance;Decreased balance;Decreased mobility       PT Treatment Interventions DME instruction;Gait training;Stair training;Functional mobility training;Therapeutic activities;Therapeutic exercise;Balance training;Patient/family education    PT Goals (Current goals can be found in the Care Plan section)  Acute Rehab PT Goals Patient Stated Goal: return home with family to assist PT Goal Formulation: With patient/family Time For Goal Achievement: 01/30/23 Potential to Achieve Goals: Good    Frequency Min 4X/week     Co-evaluation               AM-PAC PT "6 Clicks" Mobility  Outcome Measure Help needed turning from your back to your side while in a flat bed without using bedrails?: A Little Help needed moving from lying on your back to sitting on the side of a flat bed without using bedrails?: A Little Help needed moving to and from a bed to a chair (including a wheelchair)?: A Little Help needed standing up from a chair using your arms (e.g., wheelchair or bedside chair)?: A Lot Help needed to walk in  hospital room?: A Lot Help needed climbing 3-5 steps with a railing? : A Lot 6 Click Score: 15    End of Session   Activity Tolerance: Patient tolerated treatment well;Patient limited by fatigue Patient left: in chair;with call bell/phone within reach;with family/visitor present Nurse Communication: Mobility status PT Visit Diagnosis: Unsteadiness on feet (R26.81);Other abnormalities of gait and mobility (R26.89);Muscle weakness (generalized) (M62.81)    Time: 3244-0102 PT Time Calculation (min) (ACUTE ONLY): 31 min   Charges:   PT Evaluation $PT Eval Moderate Complexity: 1 Mod PT Treatments $Therapeutic Activity: 23-37 mins PT General Charges $$ ACUTE PT VISIT: 1 Visit         2:36 PM, 01/25/23 Ocie Bob, MPT Physical Therapist with [facility] [phone] office [phone] mobile phone

## 2023-01-25 NOTE — Progress Notes (Signed)
[facility] Progress Note  1 Day Post-Op  Subjective: Doing fair this AM. Working with ostomy RN. No major complaints. Urine clearing up.   Objective: Vital signs in last 24 hours: Temp:  [97.5 F (36.4 C)-98.8 F (37.1 C)] 97.6 F (36.4 C) (12/19 0431) Pulse Rate:  [77-91] 79 (12/19 0431) Resp:  [11-20] 14 (12/18 2043) BP: (97-119)/(62-77) 98/77 (12/19 0431) SpO2:  [97 %-100 %] 100 % (12/19 0431) Last BM Date : 01/24/23  Intake/Output from previous day: 12/18 0701 - 12/19 0700 In: 3691.3 [P.O.:240; I.V.:3251.3; IV Piggyback:200] Out: 1470 [Urine:630; Drains:240; Blood:600] Intake/Output this shift: Total I/O In: 0  Out: 10 [Drains:10]  General appearance: alert and no distress Resp: normal work of breathing GI: ostomy pink but edematous, midline c/d/I with staples, JP with SS output, appropriately tender ,foley in place, urine more yellow in bag   Lab Results:  Recent Labs    01/25/23 0402  WBC 11.2*  HGB 8.0*  HCT 24.8*  PLT 250   BMET Recent Labs    01/25/23 0402  NA 132*  K 4.1  CL 96*  CO2 25  GLUCOSE 160*  BUN 30*  CREATININE 2.46*  CALCIUM 8.9   PT/INR No results for input(s): "LABPROT", "INR" in the last 72 hours.  Studies/Results: No results found.  Anti-infectives: Anti-infectives (From admission, onward)    Start     Dose/Rate Route Frequency Ordered Stop   01/24/23 2200  cefoTEtan (CEFOTAN) 2 g in sodium chloride 0.9 % 100 mL IVPB        2 g 200 mL/hr over 30 Minutes Intravenous Every 12 hours 01/24/23 1602 01/27/23 2159   01/24/23 0730  cefoTEtan (CEFOTAN) 2 g in sodium chloride 0.9 % 100 mL IVPB        2 g 200 mL/hr over 30 Minutes Intravenous On call to O.R. 01/24/23 1610 01/24/23 0846   01/24/23 0718  sodium chloride 0.9 % with cefoTEtan (CEFOTAN) ADS Med       Note to Pharmacy: Waynard Edwards S: cabinet override      01/24/23 0718 01/24/23 0857       Assessment/Plan: Patient s/p  EXPLORATORY LAPAROTOMY  RIGHT URETERAL STENT PLACEMENT; RIGHT URETERONEOCYSTOTOMY WITH PSOAS HITCH SIGMOID COLECTOMY WITH END COLOSTOMY.  Tylenol scheduled PRN narcotics IS, OOB Ambulate today Wound RN working with patient, TOC for [facility] ordered, referral to ostomy clinic as outpatient put in Clear diet Awaiting more bowel function, colace Labs tomorrow, H&H drifted down with blood loss, may need blood pending further drift Cr around her baseline 1.9-2.9 historically  Cefotetan post op doses ordered SCDs, holding on lovenox/ heparin with drift in H&H Foley to remain in for 2 weeks/ after cysto with Urology JP in place  Hospitalist helping with medical care    LOS: 1 day    Lucretia Roers 01/25/2023

## 2023-01-25 NOTE — TOC Initial Note (Signed)
Transition of Care [facility]) - Initial/Assessment Note    Patient Details  Name: Kelli Hurley MRN: [id number] Date of Birth: May 06, 1955  [facility] CM/SW Contact:    Elliot Gault, LCSW Phone Number: 01/25/2023, 9:28 AM  Clinical Narrative:                  Pt admitted from home. She now has a high readmission risk score. Screening complete. MD indicating pt will need HH RN at dc for new colostomy and foley.  Per pt and her daughter, pt will return home at dc. She lives alone but has support from family as needed. They are agreeable to [facility]. CMS provider options reviewed and referred as requested. [facility] has accepted.   MD anticipating dc Sunday or Monday at the earliest. [facility] will follow and assist as needed.  Expected Discharge Plan: Home w Home Health Services Barriers to Discharge: Continued Medical Work up   Patient Goals and CMS Choice Patient states their goals for this hospitalization and ongoing recovery are:: go home CMS Medicare.gov Compare Post Acute Care list provided to:: Patient Choice offered to / list presented to : Patient      Expected Discharge Plan and Services In-house Referral: Clinical Social Work   Post Acute Care Choice: Home Health Living arrangements for the past 2 months: Single Family Home                           HH Arranged: RN HH Agency: [facility] Date HH Agency Contacted: 01/25/23   Representative spoke with at [facility] Agency: Adele Dan  Prior Living Arrangements/Services Living arrangements for the past 2 months: Single Family Home Lives with:: Self Patient language and need for interpreter reviewed:: Yes Do you feel safe going back to the place where you live?: Yes          Current home services: DME    Activities of Daily Living   ADL Screening (condition at time of admission) Independently performs ADLs?: Yes (appropriate for developmental age) Is the patient deaf or have  difficulty hearing?: No Does the patient have difficulty seeing, even when wearing glasses/contacts?: No Does the patient have difficulty concentrating, remembering, or making decisions?: No  Permission Sought/Granted Permission sought to share information with : Facility Industrial/product designer granted to share information with : Yes, Verbal Permission Granted     Permission granted to share info w AGENCY: HH        Emotional Assessment Appearance:: Appears stated age     Orientation: : Oriented to Self, Oriented to Place, Oriented to  Time, Oriented to Situation Alcohol / Substance Use: Not Applicable Psych Involvement: No (comment)  Admission diagnosis:  Colovaginal fistula [N82.4] Patient Active Problem List   Diagnosis Date Noted   Paroxysmal atrial flutter (HCC) 01/02/2023   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   Hydronephrosis due to obstruction of ureter 09/05/2022   Sepsis due to pneumonia (HCC) 09/03/2022   Lactic acidosis 09/03/2022   Hyponatremia 09/03/2022   Acute kidney injury superimposed on CKD (HCC) 09/03/2022   Hypoalbuminemia due to protein-calorie malnutrition (HCC) 09/03/2022   Hypokalemia 09/03/2022   Mixed hyperlipidemia 09/03/2022   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   S/P hysterectomy with oophorectomy 08/16/2022   Vaginal discharge 08/16/2022   OSA (obstructive sleep apnea) 04/07/2021   Heart failure with preserved ejection fraction (HCC) 12/09/2020   CKD (chronic kidney disease) stage 3, GFR 30-59 ml/min (HCC) 07/28/2020  Anemia in neoplastic disease 07/28/2020   Essential hypertension 05/12/2020   Nocturnal hypoxemia 09/02/2019   Preoperative cardiovascular examination 07/31/2019   Morbid obesity with BMI of 50.0-59.9, adult (HCC) 06/28/2019   SOB (shortness of breath) 06/27/2019   Goals of care, counseling/discussion 02/25/2019   H/O autologous stem cell transplant (HCC) 05/13/2015   Claustrophobia 10/05/2014   Multiple myeloma  (HCC) 09/23/2014   Normocytic hypochromic anemia 08/03/2014   Leukopenia 08/03/2014   Primary osteoarthritis of both knees 11/18/2013   PCP:  Joana Reamer, DO Pharmacy:   [facility] [place], [place] - [address] [place] [address] Phone: [phone] Fax: [phone]  [facility] [address], [place] - [address] [place] [address] [place] [address] [place] [address] Phone: [phone] Fax: [phone]  [facility] - [place], [place] - [address] [place] [address] Phone: [phone] Fax: [phone]     Social Drivers of Health (SDOH) Social History: SDOH Screenings   Food Insecurity: No Food Insecurity (01/24/2023)  Housing: Low Risk  (01/24/2023)  Transportation Needs: No Transportation Needs (01/24/2023)  Utilities: Not At Risk (01/24/2023)  Alcohol Screen: Low Risk  (08/16/2022)  Depression (PHQ2-9): Low Risk  (08/16/2022)  Financial Resource Strain: Medium Risk (08/16/2022)  Physical Activity: Insufficiently Active (08/16/2022)  Social Connections: Moderately Isolated (08/16/2022)  Stress: No Stress Concern Present (08/16/2022)  Tobacco Use: Low Risk  (01/24/2023)   SDOH Interventions:     Readmission Risk Interventions    01/25/2023    9:27 AM 09/04/2022    8:40 AM  Readmission Risk Prevention Plan  Transportation Screening Complete Complete  HRI or Home Care Consult Complete Complete  Social Work Consult for Recovery Care Planning/Counseling Complete Complete  Palliative Care Screening Not Applicable Not Applicable  Medication Review Oceanographer) Complete Complete

## 2023-01-25 NOTE — Plan of Care (Signed)
  Problem: Acute Rehab PT Goals(only PT should resolve) Goal: Pt Will Go Supine/Side To Sit Outcome: Progressing Flowsheets (Taken 01/25/2023 1438) Pt will go Supine/Side to Sit:  with supervision  with contact guard assist Goal: Patient Will Transfer Sit To/From Stand Outcome: Progressing Flowsheets (Taken 01/25/2023 1438) Patient will transfer sit to/from stand:  with contact guard assist  with minimal assist Goal: Pt Will Transfer Bed To Chair/Chair To Bed Outcome: Progressing Flowsheets (Taken 01/25/2023 1438) Pt will Transfer Bed to Chair/Chair to Bed:  with contact guard assist  with min assist Goal: Pt Will Ambulate Outcome: Progressing Flowsheets (Taken 01/25/2023 1438) Pt will Ambulate:  25 feet  with minimal assist  with rolling walker   2:39 PM, 01/25/23 Ocie Bob, MPT Physical Therapist with [facility] [phone] office [phone] mobile phone

## 2023-01-25 NOTE — Consult Note (Signed)
WOC Nurse ostomy consult note Stoma type/location: LLQ, colostomy  Stomal assessment/size: 1 1/2" x 1 3/8" oval; os at 8 o'clock, skin level Peristomal assessment: dip in abdominal topography at 9 o'clock and mild at 3 o'clock. Patient with abdominal bulging above stoma when sitting. Challenge for patient to see possibly when sitting.   Treatment options for stomal/peristomal skin: 2" ostomy barrier ring; discussed additional need for 1/2 barrier ring from 7-11 o'clock with next pouch change or in the future.  Output bloody Ostomy pouching: 2pc. 2 3/4" soft convex with 2" ostomy barrier ring Requested belt, feel like she will need due to dip in abdomen Education provided:  Met with patient, her daughter and another family friend.  Daughter at bedside stepped out of the room for teaching session, will not be helping in the home.  Friend of the family very engaged to learn, taking notes and asking appropriate questions.  Patient is hesitant to look at stoma, but has and understands the need to be engaged in learning care.  Explained role of ostomy nurse and creation of stoma  Explained stoma characteristics (budded, flush, color, texture, care) Demonstrated pouch change (cutting new skin barrier, measuring stoma, cleaning peristomal skin and stoma, use of barrier ring) Education on emptying when 1/3 to 1/2 full and how to empty Demonstrated "burping" flatus from pouch Demonstrated use of wick to clean spout  Discussed diet and gas Answered patient/family questions, left educational materials in the room. Supplies in the room  Discussed possible challeges with dip in abdomen and when she is sitting up. We will address as she progresses.  Highly suggest outpatient clinic follow up. Dr. Henreitta Leber to place referral.   I will plan to see this patient again on Saturday for pouch change with family; planned for 9am. Confirmed with patient and family as well. Dr. Henreitta Leber aware.   WOC Nurse will follow  along with you for continued support with ostomy teaching and care Caley Ciaramitaro [facility] MSN, RN, [place], CNS, CWON-AP [phone]       Enrolled patient in [facility] DC program: Yes/No    ty, Lupita Leash from materials was going to bring up a belt for her ostomy, do you know how to apply?  The tabs hook into the two tabs on the sides of the pouch, adjust the length with slide on belt.  Tightness adjusted to allow two fingers under belt.

## 2023-01-25 NOTE — Anesthesia Postprocedure Evaluation (Signed)
Anesthesia Post Note  Patient: Kelli Hurley  Procedure(s) Performed: EXPLORATORY LAPAROTOMY (Abdomen) SIGMOID COLECTOMY WITH END COLOSTOMY (Abdomen) RIGHT URETERAL STENT PLACEMENT; RIGHT URETERONEOCYSTOTOMY WITH PSOAS HITCH (Right: Ureter)  Patient location during evaluation: Phase II Anesthesia Type: General Level of consciousness: awake Pain management: pain level controlled Vital Signs Assessment: post-procedure vital signs reviewed and stable Respiratory status: spontaneous breathing and respiratory function stable Cardiovascular status: blood pressure returned to baseline and stable Postop Assessment: no headache and no apparent nausea or vomiting Anesthetic complications: no Comments: Late entry   No notable events documented.   Last Vitals:  Vitals:   01/25/23 0008 01/25/23 0431  BP: 99/62 98/77  Pulse: 85 79  Resp:    Temp: 36.8 C 36.4 C  SpO2: 99% 100%    Last Pain:  Vitals:   01/25/23 0442  TempSrc:   PainSc: 3                  Windell Norfolk

## 2023-01-26 ENCOUNTER — Encounter (HOSPITAL_COMMUNITY): Payer: Self-pay | Admitting: General Surgery

## 2023-01-26 DIAGNOSIS — D62 Acute posthemorrhagic anemia: Secondary | ICD-10-CM | POA: Insufficient documentation

## 2023-01-26 DIAGNOSIS — N184 Chronic kidney disease, stage 4 (severe): Secondary | ICD-10-CM

## 2023-01-26 DIAGNOSIS — N824 Other female intestinal-genital tract fistulae: Secondary | ICD-10-CM | POA: Diagnosis not present

## 2023-01-26 DIAGNOSIS — N179 Acute kidney failure, unspecified: Secondary | ICD-10-CM

## 2023-01-26 LAB — CBC WITH DIFFERENTIAL/PLATELET
Abs Immature Granulocytes: 0.1 10*3/uL — ABNORMAL HIGH (ref 0.00–0.07)
Basophils Absolute: 0.1 10*3/uL (ref 0.0–0.1)
Basophils Relative: 1 %
Eosinophils Absolute: 0 10*3/uL (ref 0.0–0.5)
Eosinophils Relative: 0 %
HCT: 19.6 % — ABNORMAL LOW (ref 36.0–46.0)
Hemoglobin: 6.4 g/dL — CL (ref 12.0–15.0)
Immature Granulocytes: 1 %
Lymphocytes Relative: 15 %
Lymphs Abs: 1.2 10*3/uL (ref 0.7–4.0)
MCH: 30.3 pg (ref 26.0–34.0)
MCHC: 32.7 g/dL (ref 30.0–36.0)
MCV: 92.9 fL (ref 80.0–100.0)
Monocytes Absolute: 1.2 10*3/uL — ABNORMAL HIGH (ref 0.1–1.0)
Monocytes Relative: 14 %
Neutro Abs: 5.8 10*3/uL (ref 1.7–7.7)
Neutrophils Relative %: 69 %
Platelets: 200 10*3/uL (ref 150–400)
RBC: 2.11 MIL/uL — ABNORMAL LOW (ref 3.87–5.11)
RDW: 15.6 % — ABNORMAL HIGH (ref 11.5–15.5)
WBC: 8.3 10*3/uL (ref 4.0–10.5)
nRBC: 0 % (ref 0.0–0.2)

## 2023-01-26 LAB — HEMOGLOBIN AND HEMATOCRIT, BLOOD
HCT: 19.8 % — ABNORMAL LOW (ref 36.0–46.0)
Hemoglobin: 6.4 g/dL — CL (ref 12.0–15.0)

## 2023-01-26 LAB — BASIC METABOLIC PANEL
Anion gap: 10 (ref 5–15)
BUN: 36 mg/dL — ABNORMAL HIGH (ref 8–23)
CO2: 24 mmol/L (ref 22–32)
Calcium: 8.3 mg/dL — ABNORMAL LOW (ref 8.9–10.3)
Chloride: 94 mmol/L — ABNORMAL LOW (ref 98–111)
Creatinine, Ser: 3.26 mg/dL — ABNORMAL HIGH (ref 0.44–1.00)
GFR, Estimated: 15 mL/min — ABNORMAL LOW (ref >60)
Glucose, Bld: 117 mg/dL — ABNORMAL HIGH (ref 70–99)
Potassium: 3.8 mmol/L (ref 3.5–5.1)
Sodium: 128 mmol/L — ABNORMAL LOW (ref 135–145)

## 2023-01-26 LAB — GLUCOSE, CAPILLARY
Glucose-Capillary: 115 mg/dL — ABNORMAL HIGH (ref 70–99)
Glucose-Capillary: 107 mg/dL — ABNORMAL HIGH (ref 70–99)
Glucose-Capillary: 123 mg/dL — ABNORMAL HIGH (ref 70–99)
Glucose-Capillary: 109 mg/dL — ABNORMAL HIGH (ref 70–99)

## 2023-01-26 LAB — MAGNESIUM: Magnesium: 2.3 mg/dL (ref 1.7–2.4)

## 2023-01-26 LAB — PHOSPHORUS: Phosphorus: 5 mg/dL — ABNORMAL HIGH (ref 2.5–4.6)

## 2023-01-26 MED ORDER — SODIUM CHLORIDE 0.9% IV SOLUTION: Freq: Once | INTRAVENOUS | Status: AC

## 2023-01-26 NOTE — Progress Notes (Signed)
[facility] Progress Note  2 Days Post-Op  Subjective: Patient is doing well. She has been drinking broth and asks about stepping up her diet. She has not passed stool into ostomy bag. The patient received education about ostomy site yesterday and will again tomorrow. She has midline abdominal pain, but no other pain. No shortness of breath, nausea, vomiting.   Objective: Vital signs in last 24 hours: Temp:  [97.9 F (36.6 C)-98.5 F (36.9 C)] 98.5 F (36.9 C) (12/20 0408) Pulse Rate:  [88-93] 93 (12/20 0408) Resp:  [18-19] 19 (12/20 0408) BP: (100-113)/(54-64) 113/64 (12/20 0408) SpO2:  [95 %-100 %] 95 % (12/20 0408) Last BM Date : 01/24/23  Intake/Output from previous day: 12/19 0701 - 12/20 0700 In: 1569.6 [P.O.:1200; I.V.:248.8; IV Piggyback:120.8] Out: 680 [Urine:600; Drains:80] Intake/Output this shift: No intake/output data recorded.  General appearance: alert and no distress Resp: normal work of breathing GI: ostomy pink but edematous, midline moslt yc/d/I with staples, but some serous crusted drainage visible on skin fold midline abdomen. JP with reddish SS output, appropriately tender ,foley in place, urine yellow in bag  Cardio: Quiet, systolic mumur. Normal rate, S1/S2  Lab Results:  Recent Labs    01/25/23 0402 01/26/23 0359 01/26/23 0619  WBC 11.2* 8.3  --   HGB 8.0* 6.4* 6.4*  HCT 24.8* 19.6* 19.8*  PLT 250 200  --    BMET Recent Labs    01/25/23 0402 01/26/23 0359  NA 132* 128*  K 4.1 3.8  CL 96* 94*  CO2 25 24  GLUCOSE 160* 117*  BUN 30* 36*  CREATININE 2.46* 3.26*  CALCIUM 8.9 8.3*   PT/INR No results for input(s): "LABPROT", "INR" in the last 72 hours.  Studies/Results: No results found.  Anti-infectives: Anti-infectives (From admission, onward)    Start     Dose/Rate Route Frequency Ordered Stop   01/24/23 2200  cefoTEtan (CEFOTAN) 2 g in sodium chloride 0.9 % 100 mL IVPB        2 g 200 mL/hr over 30 Minutes  Intravenous Every 12 hours 01/24/23 1602 01/27/23 2159   01/24/23 0730  cefoTEtan (CEFOTAN) 2 g in sodium chloride 0.9 % 100 mL IVPB        2 g 200 mL/hr over 30 Minutes Intravenous On call to O.R. 01/24/23 1610 01/24/23 0846   01/24/23 0718  sodium chloride 0.9 % with cefoTEtan (CEFOTAN) ADS Med       Note to Pharmacy: Waynard Edwards S: cabinet override      01/24/23 0718 01/24/23 0857       Assessment/Plan: Patient s/p  EXPLORATORY LAPAROTOMY RIGHT URETERAL STENT PLACEMENT; RIGHT URETERONEOCYSTOTOMY WITH PSOAS HITCH SIGMOID COLECTOMY WITH END COLOSTOMY.  -Labs tomorrow, 2 units pRBC's given due to Hgb decrease to 6.4. -Cr around her baseline 1.9-2.9 historically  -Tylenol scheduled, PRN narcotics -Encouraged Incentive Spirometer, Out of bed/ambulate -Awaiting more bowel function, colace -Step up to full liquid diet -Cefotetan post op doses ordered -SCDs, holding on lovenox/ heparin with drift in H&H -Foley to remain in for 2 weeks/ after cysto with Urology -JP in place, has SS drainage -Hospitalist helping with medical care    LOS: 2 days    Kayleen Memos 01/26/2023

## 2023-01-26 NOTE — Progress Notes (Signed)
Progress note  RN called due to patient's hemoglobin of 6.4.  H/H was repeated and it was still 6.4.  It was decided to transfuse 1 unit of PRBC s/p type and screen. 1 unit of blood was ordered.  Apixaban is yet to be restarted after surgery. Please follow-up.  Please refer to admission H&P and progress notes for details regarding the care of this patient.

## 2023-01-26 NOTE — Plan of Care (Signed)
  Problem: Education: Goal: Understanding of discharge needs will improve Outcome: Progressing   Problem: Coping: Goal: Level of anxiety will decrease Outcome: Progressing

## 2023-01-26 NOTE — Progress Notes (Signed)
PT Cancellation Note  Patient Details Name: Kelli Hurley MRN: [id number] DOB: April 30, 1955   Cancelled Treatment:    Reason Eval/Treat Not Completed: Other (comment) Attempted PT session.  Family in room and was assisting pt back to bed.  Pt reports she has sat up for 2 hours.  Pt limited by abdominal pain scale 7/10, RN aware and premedicated.  Pt left in bed with family present and call bell within reach.  Becky Sax, LPTA/CLT; CBIS [phone] Juel Burrow 01/26/2023, 3:14 PM

## 2023-01-26 NOTE — TOC Progression Note (Signed)
Transition of Care [facility]) - Progression Note    Patient Details  Name: Kelli Hurley MRN: [id number] Date of Birth: Aug 02, 1955  Transition of [facility]) CM/SW Contact  Elliot Gault, LCSW Phone Number: 01/26/2023, 11:48 AM  Clinical Narrative:     TOC following. MD anticipating dc Sunday or Monday. Home Health with [facility] has been arranged. Spoke with pt who states she has BSC and RW. She does not have any other DME needs.  Weekend TOC will be available if needed.  Expected Discharge Plan: Home w Home Health Services Barriers to Discharge: Continued Medical Work up  Expected Discharge Plan and Services In-house Referral: Clinical Social Work   Post Acute Care Choice: Home Health Living arrangements for the past 2 months: Single Family Home                           HH Arranged: RN HH Agency: [facility] Date HH Agency Contacted: 01/25/23   Representative spoke with at [facility] Agency: Adele Dan   Social Determinants of Health (SDOH) Interventions SDOH Screenings   Food Insecurity: No Food Insecurity (01/24/2023)  Housing: Low Risk  (01/24/2023)  Transportation Needs: No Transportation Needs (01/24/2023)  Utilities: Not At Risk (01/24/2023)  Alcohol Screen: Low Risk  (08/16/2022)  Depression (PHQ2-9): Low Risk  (08/16/2022)  Financial Resource Strain: Medium Risk (08/16/2022)  Physical Activity: Insufficiently Active (08/16/2022)  Social Connections: Moderately Isolated (08/16/2022)  Stress: No Stress Concern Present (08/16/2022)  Tobacco Use: Low Risk  (01/24/2023)    Readmission Risk Interventions    01/25/2023    9:27 AM 09/04/2022    8:40 AM  Readmission Risk Prevention Plan  Transportation Screening Complete Complete  HRI or Home Care Consult Complete Complete  Social Work Consult for Recovery Care Planning/Counseling Complete Complete  Palliative Care Screening Not Applicable Not Applicable  Medication Review Oceanographer)  Complete Complete

## 2023-01-26 NOTE — Progress Notes (Addendum)
PROGRESS NOTE  Kelli Hurley WGN:[id number] DOB: 22-Apr-1955 DOA: 01/24/2023 PCP: Joana Reamer, DO  Brief History:  67 year old female with past medical history of hypertension, multiple myeloma in remission with no known evidence of cardiac amyloidosis per cardiac MRI 2021, morbid obesity on semaglutide, paroxysmal atrial flutter, acquired thrombophilia on apixaban, stage IIIb CKD, OSA on CPAP, h/o obstructive uropathy, hyperlipidemia, osteoarthritis, claustrophobia, chronic leukopenia, prediabetes, normocytic anemia, grade 2 diastolic heart dysfunction, mildly reduce LF function (50-55%) who presented for 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 Henreitta Leber and McKenzie.  The surgeons requested a medical consultation for management during postoperative recovery in hospital for next several days.      Assessment/Plan:  Colovaginal Postop s/p sigmoid colectomy, end colostomy, with right ureter repair on 01/24/23 - postop management orders per Dr. Henreitta Leber  - discussed with Dr. Henreitta Leber, Foley cath to remain in place  - ostomy nurse to begin education on 12/19.     Paroxysmal atrial flutter Acquired thrombophilia  - currently not requiring AV nodal agents - HR currently controlled - restart apixaban when ok with surgery - Dr. Henreitta Leber will determine when safe to restart full anticoagulation -currently in sinus   Acute on chronic renal failure--CKD 4 -baseline creatinne 1.9-2.2 -serum creatinine up to 3.26 -due to hemodynamic changes/relative hypotension -anticipate some worsening before reaching plateau -monitor BMP   Diabetes mellitus type 2, controlled with hyperglycemia -01/19/23 A1C--6.3 -Ozempic q week PTA   Acute on chronic anemia/Acute blood loss anemia Anemia in neoplastic disease - Hgb drop to 6.4 on 12/20 - partly dilution/equilibration and blood loss from surgery - baseline Hgb 9 - holding  apixaban at this time - 12/20--transfuse 2 units PRBC   OSA on CPAP - ordered for nightly CPAP  - RT to provide auto-titrated CPAP   Chronic HFpEF with chronic lower extremity edema - clinically compensated - 09/06/22 Echo  EF 50-55%, G1DD, normal RVF, mild MR - temporarily holding home lasix - holding home metolazone 3 times weekly - likely can resume lasix 40 mg BID when eating/drinking better   Morbid obesity  - she is on weekly semaglutide -BMI 53.96 - monitor for signs of slow gastric emptying and food intolerance - IV pantoprazole ordered every evening for GI protection   Hyperlipidemia - resumed home rosuvastatin 10 mg daily    Relapsed IgG kappa Multiple myeloma with high risk features - temporarily holding pomalyst and dexamethasone 1 week prior and 2 weeks after surgery per Dr. Ellin Saba    Bilateral knee pain - chronic  and Opioid dependence - pain medication as ordered for symptomatic treatment - PDMP reviewed -percocet 5/325, #60, refill on 01/16/23 and 12/11/22 -lyirica 75 mg, #60, refill on 01/08/23 and 12/05/22               Family Communication:   Family at bedside updated 12/20   Consultants:  N/A   Code Status:  FULL    DVT Prophylaxis:  SCDs   Procedures: As Listed in Progress Note Above   Antibiotics: cefotetan        Subjective: She complains of some abd pain.  Denies f/c, cp, sob, n/v/d.  Objective: Vitals:   01/26/23 1205 01/26/23 1336 01/26/23 1351 01/26/23 1638  BP: 128/62 115/66 109/61 (!) 118/55  Pulse: 86 89 87 87  Resp: 18 18 18 18   Temp: 98.3 F (36.8 C) 98.8 F (37.1 C) 98.5  F (36.9 C) 98.8 F (37.1 C)  TempSrc: Oral Oral Oral Oral  SpO2: 100% 97% 95% 95%  Weight:      Height:        Intake/Output Summary (Last 24 hours) at 01/26/2023 1741 Last data filed at 01/26/2023 1638 Gross per 24 hour  Intake 1964 ml  Output 2730 ml  Net -766 ml   Weight change:  Exam:  General:  Pt is alert, follows commands  appropriately, not in acute distress HEENT: No icterus, No thrush, No neck mass, [place]/AT Cardiovascular: RRR, S1/S2, no rubs, no gallops Respiratory: fine bibasilar crackles.  No wheeze Abdomen: Soft/+BS, non tender, non distended, no guarding Extremities: No edema, No lymphangitis, No petechiae, No rashes, no synovitis   Data Reviewed: I have personally reviewed following labs and imaging studies Basic Metabolic Panel: Recent Labs  Lab 01/25/23 0402 01/26/23 0359  NA 132* 128*  K 4.1 3.8  CL 96* 94*  CO2 25 24  GLUCOSE 160* 117*  BUN 30* 36*  CREATININE 2.46* 3.26*  CALCIUM 8.9 8.3*  MG 1.7 2.3  PHOS 5.9* 5.0*   Liver Function Tests: No results for input(s): "AST", "ALT", "ALKPHOS", "BILITOT", "PROT", "ALBUMIN" in the last 168 hours. No results for input(s): "LIPASE", "AMYLASE" in the last 168 hours. No results for input(s): "AMMONIA" in the last 168 hours. Coagulation Profile: No results for input(s): "INR", "PROTIME" in the last 168 hours. CBC: Recent Labs  Lab 01/25/23 0402 01/26/23 0359 01/26/23 0619  WBC 11.2* 8.3  --   NEUTROABS 8.3* 5.8  --   HGB 8.0* 6.4* 6.4*  HCT 24.8* 19.6* 19.8*  MCV 92.9 92.9  --   PLT 250 200  --    Cardiac Enzymes: No results for input(s): "CKTOTAL", "CKMB", "CKMBINDEX", "TROPONINI" in the last 168 hours. BNP: Invalid input(s): "POCBNP" CBG: Recent Labs  Lab 01/25/23 1615 01/25/23 2013 01/26/23 0718 01/26/23 1121 01/26/23 1730  GLUCAP 139* 141* 115* 107* 123*   HbA1C: No results for input(s): "HGBA1C" in the last 72 hours. Urine analysis:    Component Value Date/Time   COLORURINE YELLOW 09/04/2022 0200   APPEARANCEUR Clear 11/27/2022 1535   LABSPEC 1.005 09/04/2022 0200   PHURINE 6.0 09/04/2022 0200   GLUCOSEU Negative 11/27/2022 1535   HGBUR MODERATE (A) 09/04/2022 0200   BILIRUBINUR Negative 11/27/2022 1535   KETONESUR NEGATIVE 09/04/2022 0200   PROTEINUR 1+ (A) 11/27/2022 1535   PROTEINUR 30 (A) 09/04/2022 0200    NITRITE Negative 11/27/2022 1535   NITRITE NEGATIVE 09/04/2022 0200   LEUKOCYTESUR 1+ (A) 11/27/2022 1535   LEUKOCYTESUR LARGE (A) 09/04/2022 0200   Sepsis Labs: @LABRCNTIP (procalcitonin:4,lacticidven:4) )No results found for this or any previous visit (from the past 240 hours).   Scheduled Meds:  acetaminophen  1,000 mg Oral Q6H   Chlorhexidine Gluconate Cloth  6 each Topical Daily   docusate sodium  100 mg Oral BID   insulin aspart  0-15 Units Subcutaneous TID WC   pantoprazole (PROTONIX) IV  40 mg Intravenous QHS   pregabalin  75 mg Oral BID   rosuvastatin  10 mg Oral Daily   Continuous Infusions:  cefoTEtan (CEFOTAN) IV 2 g (01/26/23 1231)    Procedures/Studies: No results found.  Catarina Hartshorn, DO  [facility]  If 7PM-7AM, please contact night-coverage [email or web address] Password [facility] 01/26/2023, 5:41 PM   LOS: 2 days

## 2023-01-26 NOTE — Care Management Important Message (Signed)
Important Message  Patient Details  Name: MEIRAV SAS MRN: [id number] Date of Birth: [birth year]   Important Message Given:  Yes - Medicare IM     Corey Harold 01/26/2023, 11:05 AM

## 2023-01-26 NOTE — Progress Notes (Signed)
Notified MD of patient hemoglobin of 6.4  Received order to recheck H&H for accuracy.  No other orders received at this time.

## 2023-01-26 NOTE — Progress Notes (Signed)
   01/26/23 4401  Provider Notification  Provider Name/Title Thomes Dinning, DO  Date Provider Notified 01/26/23  Time Provider Notified [phone]  Method of Notification Page  Notification Reason Critical Result  Test performed and critical result HGB 6.4  Date Critical Result Received 01/26/23  Time Critical Result Received [phone]

## 2023-01-27 DIAGNOSIS — Z6841 Body Mass Index (BMI) 40.0 and over, adult: Secondary | ICD-10-CM | POA: Diagnosis not present

## 2023-01-27 DIAGNOSIS — N179 Acute kidney failure, unspecified: Secondary | ICD-10-CM | POA: Diagnosis not present

## 2023-01-27 DIAGNOSIS — S3710XA Unspecified injury of ureter, initial encounter: Secondary | ICD-10-CM

## 2023-01-27 DIAGNOSIS — N824 Other female intestinal-genital tract fistulae: Secondary | ICD-10-CM | POA: Diagnosis not present

## 2023-01-27 LAB — CBC WITH DIFFERENTIAL/PLATELET
Abs Immature Granulocytes: 0.12 10*3/uL — ABNORMAL HIGH (ref 0.00–0.07)
Basophils Absolute: 0.1 10*3/uL (ref 0.0–0.1)
Basophils Relative: 1 %
Eosinophils Absolute: 0 10*3/uL (ref 0.0–0.5)
Eosinophils Relative: 0 %
HCT: 23.4 % — ABNORMAL LOW (ref 36.0–46.0)
Hemoglobin: 7.3 g/dL — ABNORMAL LOW (ref 12.0–15.0)
Immature Granulocytes: 2 %
Lymphocytes Relative: 12 %
Lymphs Abs: 0.9 10*3/uL (ref 0.7–4.0)
MCH: 29.2 pg (ref 26.0–34.0)
MCHC: 31.2 g/dL (ref 30.0–36.0)
MCV: 93.6 fL (ref 80.0–100.0)
Monocytes Absolute: 1.1 10*3/uL — ABNORMAL HIGH (ref 0.1–1.0)
Monocytes Relative: 14 %
Neutro Abs: 5.5 10*3/uL (ref 1.7–7.7)
Neutrophils Relative %: 71 %
Platelets: 184 10*3/uL (ref 150–400)
RBC: 2.5 MIL/uL — ABNORMAL LOW (ref 3.87–5.11)
RDW: 15.7 % — ABNORMAL HIGH (ref 11.5–15.5)
WBC: 7.7 10*3/uL (ref 4.0–10.5)
nRBC: 0 % (ref 0.0–0.2)

## 2023-01-27 LAB — MAGNESIUM: Magnesium: 2.3 mg/dL (ref 1.7–2.4)

## 2023-01-27 LAB — BASIC METABOLIC PANEL
Anion gap: 10 (ref 5–15)
BUN: 32 mg/dL — ABNORMAL HIGH (ref 8–23)
CO2: 22 mmol/L (ref 22–32)
Calcium: 8 mg/dL — ABNORMAL LOW (ref 8.9–10.3)
Chloride: 97 mmol/L — ABNORMAL LOW (ref 98–111)
Creatinine, Ser: 2.9 mg/dL — ABNORMAL HIGH (ref 0.44–1.00)
GFR, Estimated: 17 mL/min — ABNORMAL LOW (ref >60)
Glucose, Bld: 98 mg/dL (ref 70–99)
Potassium: 3 mmol/L — ABNORMAL LOW (ref 3.5–5.1)
Sodium: 129 mmol/L — ABNORMAL LOW (ref 135–145)

## 2023-01-27 LAB — TYPE AND SCREEN
ABO/RH(D): B POS
Antibody Screen: NEGATIVE
Unit division: 0
Unit division: 0

## 2023-01-27 LAB — BPAM RBC
Blood Product Expiration Date: 202412302359
Blood Product Expiration Date: 202501112359
ISSUE DATE / TIME: 202412200822
ISSUE DATE / TIME: 202412201328
Unit Type and Rh: 1700
Unit Type and Rh: 1700

## 2023-01-27 LAB — GLUCOSE, CAPILLARY
Glucose-Capillary: 98 mg/dL (ref 70–99)
Glucose-Capillary: 104 mg/dL — ABNORMAL HIGH (ref 70–99)
Glucose-Capillary: 120 mg/dL — ABNORMAL HIGH (ref 70–99)
Glucose-Capillary: 127 mg/dL — ABNORMAL HIGH (ref 70–99)

## 2023-01-27 LAB — PHOSPHORUS: Phosphorus: 4.2 mg/dL (ref 2.5–4.6)

## 2023-01-27 MED ORDER — MIRABEGRON ER 25 MG PO TB24
25.0000 mg | ORAL_TABLET | Freq: Every day | ORAL | Status: DC
  Administered 2023-01-27 – 2023-01-29 (×3): 25 mg via ORAL
  Filled 2023-01-27 (×3): qty 1

## 2023-01-27 MED ORDER — POTASSIUM CHLORIDE CRYS ER 20 MEQ PO TBCR
20.0000 meq | EXTENDED_RELEASE_TABLET | Freq: Once | ORAL | Status: AC
  Administered 2023-01-27: 20 meq via ORAL
  Filled 2023-01-27: qty 1

## 2023-01-27 MED ORDER — POTASSIUM CHLORIDE 10 MEQ/100ML IV SOLN
10.0000 meq | INTRAVENOUS | Status: AC
  Administered 2023-01-27 (×3): 10 meq via INTRAVENOUS
  Filled 2023-01-27 (×3): qty 100

## 2023-01-27 MED ORDER — POTASSIUM CHLORIDE 10 MEQ/100ML IV SOLN
10.0000 meq | INTRAVENOUS | Status: DC
  Administered 2023-01-27: 10 meq via INTRAVENOUS
  Filled 2023-01-27: qty 100

## 2023-01-27 NOTE — Consult Note (Signed)
WOC Nurse ostomy follow up Stoma type/location: LLQ, end colostomy Stomal assessment/size: 1 1/2" x 1 3/8" oval shaped, budded, however os at skin level at 7 o'clock.   Peristomal assessment: dip in abdomen from 7-11 o'clock. Worsens with patient sitting up   Treatment options for stomal/peristomal skin: 2" barrier ring; added 1/2 barrier ring today from 7-11 o'clock because she was starting to leak at this site  Output liquid green Ostomy pouching: 2pc. Soft convex, 1 and 1/2 2" ostomy barrier ring see above  Education: daughter Music therapist) here today, despite the pouch was just changed before my arrival we decided to change so that the entire family can observe change. Allowed daughter to cut skin barrier, will need reinforcement with this skill. Removed old pouch, barrier ring had not been used and this pouch would have leaked soon.  Demonstrated using barrier ring and 1/2 of another ring and explained rationale to the patient and family.  Friend of the family is making notes again today.  Placed new skin barrier and pouch, demonstrated cleaning spout again, rationale for cleaning spout.  ADDED BELT TODAY TO LESSEN LIKELIHOOD OF LEAKAGE. Explaied rationale for use.  Family assisted with placement. Allowed daughter to practice with lock and roll closure. Education on frequency of pouch change, and need to empty when 1/2 full.  Marked [facility] catelog with items used, explained however that they will need to contact [facility] for supplies with her  MCD.  Provided contact information for same. Hopefully she can have HHRN for support.  Will need referral to outpatient ostomy clinic.   7 pouching systems in room.  Enrolled patient in [place] Secure Start Discharge program: Yes  Planned visit at 7am Monday; to allow daughter who works inpatient oncology at [employer] and family friend who has  been present for other teaching as well to attend.   WOC Nurse will follow along with you for continued support with  ostomy teaching and care Carrine Kroboth [facility], RN, [place], CNS, [place] [phone]

## 2023-01-27 NOTE — Progress Notes (Signed)
Pt slept well throughout the night, PO (oxy) pain med given every 4-5 hours during shift for abdominal pain. Pts foley found to be leaking and most of pt output went unmeasured. emptied from foley during the shift.

## 2023-01-27 NOTE — Progress Notes (Signed)
3 Days Post-Op  Subjective: Patient has no complaints this morning.  Is starting to have ostomy output.  Is hungry.  Is having some leakage around the Foley catheter.  Objective: Vital signs in last 24 hours: Temp:  [98.3 F (36.8 C)-99.2 F (37.3 C)] 98.9 F (37.2 C) (12/21 0440) Pulse Rate:  [82-89] 82 (12/21 0440) Resp:  [14-18] 14 (12/21 0440) BP: (109-128)/(55-67) 114/67 (12/21 0440) SpO2:  [88 %-100 %] 97 % (12/21 0440) Last BM Date : 01/27/23  Intake/Output from previous day: 12/20 0701 - 12/21 0700 In: 3164 [P.O.:2160; I.V.:10; Blood:994] Out: 2615 [Urine:2600; Drains:15] Intake/Output this shift: No intake/output data recorded.  General appearance: alert, cooperative, and no distress Resp: clear to auscultation bilaterally Cardio: regular rate and rhythm, S1, S2 normal, no murmur, click, rub or gallop GI: Soft, incision healing well.  Ostomy wafer with leakage medially towards the midline incision.  Surgical dressing with some soilage, thus removed.  Ostomy appliance was reinforced.  JP drain removed.  Lab Results:  Recent Labs    01/26/23 0359 01/26/23 0619 01/27/23 0417  WBC 8.3  --  7.7  HGB 6.4* 6.4* 7.3*  HCT 19.6* 19.8* 23.4*  PLT 200  --  184   BMET Recent Labs    01/26/23 0359 01/27/23 0417  NA 128* 129*  K 3.8 3.0*  CL 94* 97*  CO2 24 22  GLUCOSE 117* 98  BUN 36* 32*  CREATININE 3.26* 2.90*  CALCIUM 8.3* 8.0*   PT/INR No results for input(s): "LABPROT", "INR" in the last 72 hours.  Studies/Results: No results found.  Anti-infectives: Anti-infectives (From admission, onward)    Start     Dose/Rate Route Frequency Ordered Stop   01/24/23 2200  cefoTEtan (CEFOTAN) 2 g in sodium chloride 0.9 % 100 mL IVPB        2 g 200 mL/hr over 30 Minutes Intravenous Every 12 hours 01/24/23 1602 01/27/23 2159   01/24/23 0730  cefoTEtan (CEFOTAN) 2 g in sodium chloride 0.9 % 100 mL IVPB        2 g 200 mL/hr over 30 Minutes Intravenous On call to O.R.  01/24/23 8119 01/24/23 0846   01/24/23 0718  sodium chloride 0.9 % with cefoTEtan (CEFOTAN) ADS Med       Note to Pharmacy: Waynard Edwards S: cabinet override      01/24/23 0718 01/24/23 0857       Assessment/Plan: s/p Procedure(s): EXPLORATORY LAPAROTOMY RIGHT URETERAL STENT PLACEMENT; RIGHT URETERONEOCYSTOTOMY WITH PSOAS HITCH SIGMOID COLECTOMY WITH END COLOSTOMY Impression: Postoperative day 3.  Patient's bowel function has returned.  Ostomy appliance reapplied.  For ostomy instruction today.  Patient having some bladder spasms which is being addressed by urology.  JP drain removed.  Hemoglobin stable after receiving several units of packed red blood cells.  Will monitor.  Hypokalemia addressed.  Advance to carb modified diet.  LOS: 3 days    Kelli Hurley 01/27/2023

## 2023-01-27 NOTE — Progress Notes (Signed)
Patient alert and verbal, tolerated medications whole with no complaints. Ambulated with assistance in room using front wheel walker. Patient reported complaints of pain several times during shift PRN given, see MAR. Surgical incision to abdomen clean, dry and intact. MD Lovell Sheehan removed honeycomb dressing and JP drain, staples still intact. Changed colostomy bag and pouch due to leaking. MD Lovell Sheehan aware at bedside. Foley leaking, output measured during shift. MD Lovell Sheehan aware.

## 2023-01-27 NOTE — Plan of Care (Signed)
  Problem: Education: Goal: Understanding of discharge needs will improve Outcome: Progressing Goal: Verbalization of understanding of the causes of altered bowel function will improve Outcome: Progressing   Problem: Activity: Goal: Ability to tolerate increased activity will improve Outcome: Progressing   Problem: Bowel/Gastric: Goal: Gastrointestinal status for postoperative course will improve Outcome: Progressing   Problem: Health Behavior/Discharge Planning: Goal: Identification of community resources to assist with postoperative recovery needs will improve Outcome: Progressing   Problem: Nutritional: Goal: Will attain and maintain optimal nutritional status will improve Outcome: Progressing   Problem: Clinical Measurements: Goal: Postoperative complications will be avoided or minimized Outcome: Progressing   Problem: Respiratory: Goal: Respiratory status will improve Outcome: Progressing   Problem: Skin Integrity: Goal: Will show signs of wound healing Outcome: Progressing   Problem: Education: Goal: Knowledge of General Education information will improve Description: Including pain rating scale, medication(s)/side effects and non-pharmacologic comfort measures Outcome: Progressing   Problem: Health Behavior/Discharge Planning: Goal: Ability to manage health-related needs will improve Outcome: Progressing   Problem: Clinical Measurements: Goal: Ability to maintain clinical measurements within normal limits will improve Outcome: Progressing Goal: Will remain free from infection Outcome: Progressing Goal: Diagnostic test results will improve Outcome: Progressing Goal: Respiratory complications will improve Outcome: Progressing Goal: Cardiovascular complication will be avoided Outcome: Progressing   Problem: Activity: Goal: Risk for activity intolerance will decrease Outcome: Progressing   Problem: Nutrition: Goal: Adequate nutrition will be  maintained Outcome: Progressing   Problem: Coping: Goal: Level of anxiety will decrease Outcome: Progressing   Problem: Elimination: Goal: Will not experience complications related to bowel motility Outcome: Progressing Goal: Will not experience complications related to urinary retention Outcome: Progressing   Problem: Pain Management: Goal: General experience of comfort will improve Outcome: Progressing   Problem: Safety: Goal: Ability to remain free from injury will improve Outcome: Progressing   Problem: Skin Integrity: Goal: Risk for impaired skin integrity will decrease Outcome: Progressing   Problem: Education: Goal: Ability to describe self-care measures that may prevent or decrease complications (Diabetes Survival Skills Education) will improve Outcome: Progressing Goal: Individualized Educational Video(s) Outcome: Progressing   Problem: Coping: Goal: Ability to adjust to condition or change in health will improve Outcome: Progressing   Problem: Fluid Volume: Goal: Ability to maintain a balanced intake and output will improve Outcome: Progressing   Problem: Health Behavior/Discharge Planning: Goal: Ability to identify and utilize available resources and services will improve Outcome: Progressing Goal: Ability to manage health-related needs will improve Outcome: Progressing   Problem: Metabolic: Goal: Ability to maintain appropriate glucose levels will improve Outcome: Progressing   Problem: Nutritional: Goal: Maintenance of adequate nutrition will improve Outcome: Progressing Goal: Progress toward achieving an optimal weight will improve Outcome: Progressing

## 2023-01-27 NOTE — Progress Notes (Signed)
PROGRESS NOTE  Kelli Hurley WJX:[id number] DOB: Sep 10, 1955 DOA: 01/24/2023 PCP: Joana Reamer, DO  Brief History:  67 year old female with past medical history of hypertension, multiple myeloma in remission with no known evidence of cardiac amyloidosis per cardiac MRI 2021, morbid obesity on semaglutide, paroxysmal atrial flutter, acquired thrombophilia on apixaban, stage IIIb CKD, OSA on CPAP, h/o obstructive uropathy, hyperlipidemia, osteoarthritis, claustrophobia, chronic leukopenia, prediabetes, normocytic anemia, grade 2 diastolic heart dysfunction, mildly reduce LF function (50-55%) who presented for 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 Henreitta Leber and McKenzie.  The surgeons requested a medical consultation for management during postoperative recovery in hospital for next several days.      Assessment/Plan: Colovaginal Postop s/p sigmoid colectomy, end colostomy, with right ureter repair on 01/24/23 - postop management orders per Dr. Henreitta Leber  - discussed with Dr. Henreitta Leber, Foley cath to remain in place  - ostomy nurse to begin education on 12/19.   - pt having stool in ostomy now - 12/21--diet advanced to carb modified   Paroxysmal atrial flutter Acquired thrombophilia  - currently not requiring AV nodal agents - HR currently controlled - restart apixaban when ok with surgery - Dr. Henreitta Leber will determine when safe to restart full anticoagulation -currently in sinus   Acute on chronic renal failure--CKD 4 -baseline creatinne 1.9-2.2 -serum creatinine up to 3.26 -due to hemodynamic changes/relative hypotension -anticipate some worsening before reaching plateau -monitor BMP   Diabetes mellitus type 2, controlled with hyperglycemia -01/19/23 A1C--6.3 -Ozempic q week PTA   Acute on chronic anemia/Acute blood loss anemia Anemia in neoplastic disease - Hgb drop to 6.4 on 12/20 - partly  dilution/equilibration and blood loss from surgery - baseline Hgb 9 - holding apixaban at this time - 12/20--transfused 2 units PRBC   OSA on CPAP - ordered for nightly CPAP  - RT to provide auto-titrated CPAP   Chronic HFpEF with chronic lower extremity edema - clinically compensated - 09/06/22 Echo  EF 50-55%, G1DD, normal RVF, mild MR - temporarily holding home lasix - holding home metolazone 3 times weekly - likely can resume lasix 40 mg BID when eating/drinking better   Morbid obesity  - she is on weekly semaglutide -BMI 53.96 - monitor for signs of slow gastric emptying and food intolerance - IV pantoprazole ordered every evening for GI protection   Hyperlipidemia - resumed home rosuvastatin 10 mg daily    Relapsed IgG kappa Multiple myeloma with high risk features - temporarily holding pomalyst and dexamethasone 1 week prior and 2 weeks after surgery per Dr. Ellin Saba    Bilateral knee pain - chronic  and Opioid dependence - pain medication as ordered for symptomatic treatment - PDMP reviewed -percocet 5/325, #60, refill on 01/16/23 and 12/11/22 -lyirica 75 mg, #60, refill on 01/08/23 and 12/05/22               Family Communication:   Family at bedside updated 12/21   Consultants:  N/A   Code Status:  FULL    DVT Prophylaxis:  SCDs   Procedures: As Listed in Progress Note Above   Antibiotics: cefotetan        Subjective: Patient denies fevers, chills, headache, chest pain, dyspnea, nausea, vomiting, diarrhea, abdominal pain, dysuria, hematuria, hematochezia, and melena.   Objective: Vitals:   01/26/23 1351 01/26/23 1638 01/26/23 1926 01/27/23 0440  BP: 109/61 (!) 118/55 120/63 114/67  Pulse: 87 87  88 82  Resp: 18 18 16 14   Temp: 98.5 F (36.9 C) 98.8 F (37.1 C) 99.2 F (37.3 C) 98.9 F (37.2 C)  TempSrc: Oral Oral Oral Oral  SpO2: 95% 95% (!) 88% 97%  Weight:      Height:        Intake/Output Summary (Last 24 hours) at 01/27/2023  1357 Last data filed at 01/27/2023 1122 Gross per 24 hour  Intake 1804 ml  Output 715 ml  Net 1089 ml   Weight change:  Exam:  General:  Pt is alert, follows commands appropriately, not in acute distress HEENT: No icterus, No thrush, No neck mass, [place]/AT Cardiovascular: RRR, S1/S2, no rubs, no gallops Respiratory: CTA bilaterally, no wheezing, no crackles, no rhonchi Abdomen: Soft/+BS, non tender, non distended, no guarding Extremities: No edema, No lymphangitis, No petechiae, No rashes, no synovitis   Data Reviewed: I have personally reviewed following labs and imaging studies Basic Metabolic Panel: Recent Labs  Lab 01/25/23 0402 01/26/23 0359 01/27/23 0417  NA 132* 128* 129*  K 4.1 3.8 3.0*  CL 96* 94* 97*  CO2 25 24 22   GLUCOSE 160* 117* 98  BUN 30* 36* 32*  CREATININE 2.46* 3.26* 2.90*  CALCIUM 8.9 8.3* 8.0*  MG 1.7 2.3 2.3  PHOS 5.9* 5.0* 4.2   Liver Function Tests: No results for input(s): "AST", "ALT", "ALKPHOS", "BILITOT", "PROT", "ALBUMIN" in the last 168 hours. No results for input(s): "LIPASE", "AMYLASE" in the last 168 hours. No results for input(s): "AMMONIA" in the last 168 hours. Coagulation Profile: No results for input(s): "INR", "PROTIME" in the last 168 hours. CBC: Recent Labs  Lab 01/25/23 0402 01/26/23 0359 01/26/23 0619 01/27/23 0417  WBC 11.2* 8.3  --  7.7  NEUTROABS 8.3* 5.8  --  5.5  HGB 8.0* 6.4* 6.4* 7.3*  HCT 24.8* 19.6* 19.8* 23.4*  MCV 92.9 92.9  --  93.6  PLT 250 200  --  184   Cardiac Enzymes: No results for input(s): "CKTOTAL", "CKMB", "CKMBINDEX", "TROPONINI" in the last 168 hours. BNP: Invalid input(s): "POCBNP" CBG: Recent Labs  Lab 01/26/23 1121 01/26/23 1730 01/26/23 2055 01/27/23 0727 01/27/23 1119  GLUCAP 107* 123* 109* 98 104*   HbA1C: No results for input(s): "HGBA1C" in the last 72 hours. Urine analysis:    Component Value Date/Time   COLORURINE YELLOW 09/04/2022 0200   APPEARANCEUR Clear 11/27/2022  1535   LABSPEC 1.005 09/04/2022 0200   PHURINE 6.0 09/04/2022 0200   GLUCOSEU Negative 11/27/2022 1535   HGBUR MODERATE (A) 09/04/2022 0200   BILIRUBINUR Negative 11/27/2022 1535   KETONESUR NEGATIVE 09/04/2022 0200   PROTEINUR 1+ (A) 11/27/2022 1535   PROTEINUR 30 (A) 09/04/2022 0200   NITRITE Negative 11/27/2022 1535   NITRITE NEGATIVE 09/04/2022 0200   LEUKOCYTESUR 1+ (A) 11/27/2022 1535   LEUKOCYTESUR LARGE (A) 09/04/2022 0200   Sepsis Labs: @LABRCNTIP (procalcitonin:4,lacticidven:4) )No results found for this or any previous visit (from the past 240 hours).   Scheduled Meds:  acetaminophen  1,000 mg Oral Q6H   Chlorhexidine Gluconate Cloth  6 each Topical Daily   docusate sodium  100 mg Oral BID   insulin aspart  0-15 Units Subcutaneous TID WC   mirabegron ER  25 mg Oral Daily   pantoprazole (PROTONIX) IV  40 mg Intravenous QHS   pregabalin  75 mg Oral BID   rosuvastatin  10 mg Oral Daily   Continuous Infusions:  potassium chloride 10 mEq (01/27/23 1324)    Procedures/Studies: No results  found.  Catarina Hartshorn, DO  [facility]  If 7PM-7AM, please contact night-coverage [email or web address] Password [email or web address] 01/27/2023, 1:57 PM   LOS: 3 days

## 2023-01-28 DIAGNOSIS — N179 Acute kidney failure, unspecified: Secondary | ICD-10-CM | POA: Diagnosis not present

## 2023-01-28 DIAGNOSIS — Z6841 Body Mass Index (BMI) 40.0 and over, adult: Secondary | ICD-10-CM | POA: Diagnosis not present

## 2023-01-28 DIAGNOSIS — N824 Other female intestinal-genital tract fistulae: Secondary | ICD-10-CM | POA: Diagnosis not present

## 2023-01-28 LAB — GLUCOSE, CAPILLARY
Glucose-Capillary: 101 mg/dL — ABNORMAL HIGH (ref 70–99)
Glucose-Capillary: 109 mg/dL — ABNORMAL HIGH (ref 70–99)
Glucose-Capillary: 140 mg/dL — ABNORMAL HIGH (ref 70–99)
Glucose-Capillary: 155 mg/dL — ABNORMAL HIGH (ref 70–99)

## 2023-01-28 LAB — BASIC METABOLIC PANEL
Anion gap: 10 (ref 5–15)
Anion gap: 11 (ref 5–15)
BUN: 31 mg/dL — ABNORMAL HIGH (ref 8–23)
BUN: 31 mg/dL — ABNORMAL HIGH (ref 8–23)
CO2: 23 mmol/L (ref 22–32)
CO2: 23 mmol/L (ref 22–32)
Calcium: 8.5 mg/dL — ABNORMAL LOW (ref 8.9–10.3)
Calcium: 8.9 mg/dL (ref 8.9–10.3)
Chloride: 100 mmol/L (ref 98–111)
Chloride: 102 mmol/L (ref 98–111)
Creatinine, Ser: 3 mg/dL — ABNORMAL HIGH (ref 0.44–1.00)
Creatinine, Ser: 2.92 mg/dL — ABNORMAL HIGH (ref 0.44–1.00)
GFR, Estimated: 17 mL/min — ABNORMAL LOW (ref >60)
GFR, Estimated: 17 mL/min — ABNORMAL LOW (ref >60)
Glucose, Bld: 124 mg/dL — ABNORMAL HIGH (ref 70–99)
Glucose, Bld: 133 mg/dL — ABNORMAL HIGH (ref 70–99)
Potassium: 3 mmol/L — ABNORMAL LOW (ref 3.5–5.1)
Potassium: 3.4 mmol/L — ABNORMAL LOW (ref 3.5–5.1)
Sodium: 133 mmol/L — ABNORMAL LOW (ref 135–145)
Sodium: 136 mmol/L (ref 135–145)

## 2023-01-28 LAB — CBC
HCT: 23.3 % — ABNORMAL LOW (ref 36.0–46.0)
Hemoglobin: 7.7 g/dL — ABNORMAL LOW (ref 12.0–15.0)
MCH: 30.2 pg (ref 26.0–34.0)
MCHC: 33 g/dL (ref 30.0–36.0)
MCV: 91.4 fL (ref 80.0–100.0)
Platelets: 224 10*3/uL (ref 150–400)
RBC: 2.55 MIL/uL — ABNORMAL LOW (ref 3.87–5.11)
RDW: 15.8 % — ABNORMAL HIGH (ref 11.5–15.5)
WBC: 5 10*3/uL (ref 4.0–10.5)
nRBC: 0 % (ref 0.0–0.2)

## 2023-01-28 LAB — PHOSPHORUS: Phosphorus: 3.2 mg/dL (ref 2.5–4.6)

## 2023-01-28 LAB — MAGNESIUM: Magnesium: 2.2 mg/dL (ref 1.7–2.4)

## 2023-01-28 MED ORDER — POTASSIUM CHLORIDE 10 MEQ/100ML IV SOLN
10.0000 meq | INTRAVENOUS | Status: AC
  Administered 2023-01-28 (×5): 10 meq via INTRAVENOUS
  Filled 2023-01-28 (×5): qty 100

## 2023-01-28 MED ORDER — POTASSIUM CHLORIDE CRYS ER 20 MEQ PO TBCR
20.0000 meq | EXTENDED_RELEASE_TABLET | Freq: Once | ORAL | Status: AC
  Administered 2023-01-28: 20 meq via ORAL
  Filled 2023-01-28: qty 1

## 2023-01-28 MED ORDER — POTASSIUM CHLORIDE IN NACL 20-0.9 MEQ/L-% IV SOLN
INTRAVENOUS | Status: AC
Start: 2023-01-28 — End: 2023-01-29

## 2023-01-28 MED ORDER — HEPARIN SOD (PORK) LOCK FLUSH 100 UNIT/ML IV SOLN
500.0000 [IU] | Freq: Once | INTRAVENOUS | Status: AC
Start: 2023-01-28 — End: 2023-01-28
  Administered 2023-01-28: 500 [IU]
  Filled 2023-01-28: qty 5

## 2023-01-28 NOTE — Progress Notes (Signed)
PROGRESS NOTE  Kelli Hurley:[id number] DOB: 08-May-1955 DOA: 01/24/2023 PCP: Joana Reamer, DO  Brief History:  67 year old female with past medical history of hypertension, multiple myeloma in remission with no known evidence of cardiac amyloidosis per cardiac MRI 2021, morbid obesity on semaglutide, paroxysmal atrial flutter, acquired thrombophilia on apixaban, stage IIIb CKD, OSA on CPAP, h/o obstructive uropathy, hyperlipidemia, osteoarthritis, claustrophobia, chronic leukopenia, prediabetes, normocytic anemia, grade 2 diastolic heart dysfunction, mildly reduce LF function (50-55%) who presented for 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 Henreitta Leber and McKenzie.  The surgeons requested a medical consultation for management during postoperative recovery in hospital for next several days.      Assessment/Plan: Colovaginal Postop s/p sigmoid colectomy, end colostomy, with right ureter repair on 01/24/23 - postop management orders per Dr. Henreitta Leber  - discussed with Dr. Henreitta Leber, Foley cath to remain in place  - ostomy nurse to begin education on 12/19.   - pt having stool in ostomy now - 12/21--diet advanced to carb modified   Paroxysmal atrial flutter Acquired thrombophilia  - currently not requiring AV nodal agents - HR currently controlled - restart apixaban when ok with surgery - Dr. Henreitta Leber will determine when safe to restart full anticoagulation -currently in sinus   Acute on chronic renal failure--CKD 4 -baseline creatinne 1.9-2.2 -serum creatinine up to 3.26 -due to hemodynamic changes/relative hypotension -anticipate some worsening before reaching plateau -give 12 hours IVF -monitor BMP   Diabetes mellitus type 2, controlled with hyperglycemia -01/19/23 A1C--6.3 -Ozempic q week PTA   Acute on chronic anemia/Acute blood loss anemia Anemia in neoplastic disease - Hgb drop to 6.4 on  12/20 - partly dilution/equilibration and blood loss from surgery - baseline Hgb 9 - holding apixaban at this time - 12/20--transfused 2 units PRBC   OSA on CPAP - ordered for nightly CPAP  - RT to provide auto-titrated CPAP   Chronic HFpEF with chronic lower extremity edema - clinically compensated - 09/06/22 Echo  EF 50-55%, G1DD, normal RVF, mild MR - temporarily holding home lasix - holding home metolazone 3 times weekly - likely can resume lasix 40 mg BID when eating/drinking better   Morbid obesity  - she is on weekly semaglutide -BMI 53.96 - monitor for signs of slow gastric emptying and food intolerance - IV pantoprazole ordered every evening for GI protection   Hyperlipidemia - resumed home rosuvastatin 10 mg daily    Relapsed IgG kappa Multiple myeloma with high risk features - temporarily holding pomalyst and dexamethasone 1 week prior and 2 weeks after surgery per Dr. Ellin Saba    Bilateral knee pain - chronic  and Opioid dependence - pain medication as ordered for symptomatic treatment - PDMP reviewed -percocet 5/325, #60, refill on 01/16/23 and 12/11/22 -lyirica 75 mg, #60, refill on 01/08/23 and 12/05/22               Family Communication:   Family at bedside updated 12/22   Consultants:  N/A   Code Status:  FULL    DVT Prophylaxis:  SCDs   Procedures: As Listed in Progress Note Above   Antibiotics: cefotetan            Subjective: Patient denies fevers, chills, headache, chest pain, dyspnea, nausea, vomiting, diarrhea, abdominal pain, dysuria, hematuria, hematochezia, and melena. Abd pain is controlled  Objective: Vitals:   01/27/23 1824 01/27/23 2010 01/28/23 0602 01/28/23 1306  BP: 99/70 121/66 117/62 107/66  Pulse:  84 84 83  Resp:  14 16   Temp:  98.8 F (37.1 C) 98.5 F (36.9 C) 98.6 F (37 C)  TempSrc:  Oral Oral   SpO2:  95% 98% 98%  Weight:      Height:        Intake/Output Summary (Last 24 hours) at 01/28/2023  1623 Last data filed at 01/28/2023 1300 Gross per 24 hour  Intake 720 ml  Output --  Net 720 ml   Weight change:  Exam:  General:  Pt is alert, follows commands appropriately, not in acute distress HEENT: No icterus, No thrush, No neck mass, [place]/AT Cardiovascular: RRR, S1/S2, no rubs, no gallops Respiratory: CTA bilaterally, no wheezing, no crackles, no rhonchi Abdomen: Soft/+BS, mild diffusetender, non distended, no guarding Extremities: No edema, No lymphangitis, No petechiae, No rashes, no synovitis   Data Reviewed: I have personally reviewed following labs and imaging studies Basic Metabolic Panel: Recent Labs  Lab 01/25/23 0402 01/26/23 0359 01/27/23 0417 01/28/23 1030  NA 132* 128* 129* 133*  K 4.1 3.8 3.0* 3.0*  CL 96* 94* 97* 100  CO2 25 24 22 23   GLUCOSE 160* 117* 98 124*  BUN 30* 36* 32* 31*  CREATININE 2.46* 3.26* 2.90* 3.00*  CALCIUM 8.9 8.3* 8.0* 8.5*  MG 1.7 2.3 2.3 2.2  PHOS 5.9* 5.0* 4.2 3.2   Liver Function Tests: No results for input(s): "AST", "ALT", "ALKPHOS", "BILITOT", "PROT", "ALBUMIN" in the last 168 hours. No results for input(s): "LIPASE", "AMYLASE" in the last 168 hours. No results for input(s): "AMMONIA" in the last 168 hours. Coagulation Profile: No results for input(s): "INR", "PROTIME" in the last 168 hours. CBC: Recent Labs  Lab 01/25/23 0402 01/26/23 0359 01/26/23 0619 01/27/23 0417 01/28/23 1030  WBC 11.2* 8.3  --  7.7 5.0  NEUTROABS 8.3* 5.8  --  5.5  --   HGB 8.0* 6.4* 6.4* 7.3* 7.7*  HCT 24.8* 19.6* 19.8* 23.4* 23.3*  MCV 92.9 92.9  --  93.6 91.4  PLT 250 200  --  184 224   Cardiac Enzymes: No results for input(s): "CKTOTAL", "CKMB", "CKMBINDEX", "TROPONINI" in the last 168 hours. BNP: Invalid input(s): "POCBNP" CBG: Recent Labs  Lab 01/27/23 1119 01/27/23 1607 01/27/23 2124 01/28/23 0715 01/28/23 1118  GLUCAP 104* 120* 127* 101* 109*   HbA1C: No results for input(s): "HGBA1C" in the last 72 hours. Urine  analysis:    Component Value Date/Time   COLORURINE YELLOW 09/04/2022 0200   APPEARANCEUR Clear 11/27/2022 1535   LABSPEC 1.005 09/04/2022 0200   PHURINE 6.0 09/04/2022 0200   GLUCOSEU Negative 11/27/2022 1535   HGBUR MODERATE (A) 09/04/2022 0200   BILIRUBINUR Negative 11/27/2022 1535   KETONESUR NEGATIVE 09/04/2022 0200   PROTEINUR 1+ (A) 11/27/2022 1535   PROTEINUR 30 (A) 09/04/2022 0200   NITRITE Negative 11/27/2022 1535   NITRITE NEGATIVE 09/04/2022 0200   LEUKOCYTESUR 1+ (A) 11/27/2022 1535   LEUKOCYTESUR LARGE (A) 09/04/2022 0200   Sepsis Labs: @LABRCNTIP (procalcitonin:4,lacticidven:4) )No results found for this or any previous visit (from the past 240 hours).   Scheduled Meds:  acetaminophen  1,000 mg Oral Q6H   Chlorhexidine Gluconate Cloth  6 each Topical Daily   docusate sodium  100 mg Oral BID   insulin aspart  0-15 Units Subcutaneous TID WC   mirabegron ER  25 mg Oral Daily   pantoprazole (PROTONIX) IV  40 mg Intravenous QHS   pregabalin  75 mg Oral  BID   rosuvastatin  10 mg Oral Daily   Continuous Infusions:  potassium chloride      Procedures/Studies: No results found.  Catarina Hartshorn, DO  [facility]  If 7PM-7AM, please contact night-coverage [email or web address] Password [facility] 01/28/2023, 4:23 PM   LOS: 4 days

## 2023-01-28 NOTE — Progress Notes (Signed)
Asked patient about using CPAP.  Patient stated she did have a CPAP machine but for the last 3 to 4 months, according to patient, she has not used and has tried to reach out to the company to get it fixed.  Patient stated that she does not want to use a CPAP here at hospital.

## 2023-01-28 NOTE — Plan of Care (Signed)
  Problem: Education: Goal: Understanding of discharge needs will improve Outcome: Progressing Goal: Verbalization of understanding of the causes of altered bowel function will improve Outcome: Progressing   Problem: Activity: Goal: Ability to tolerate increased activity will improve Outcome: Progressing   Problem: Bowel/Gastric: Goal: Gastrointestinal status for postoperative course will improve Outcome: Progressing   Problem: Health Behavior/Discharge Planning: Goal: Identification of community resources to assist with postoperative recovery needs will improve Outcome: Progressing   Problem: Nutritional: Goal: Will attain and maintain optimal nutritional status will improve Outcome: Progressing   Problem: Clinical Measurements: Goal: Postoperative complications will be avoided or minimized Outcome: Progressing   Problem: Respiratory: Goal: Respiratory status will improve Outcome: Progressing   Problem: Skin Integrity: Goal: Will show signs of wound healing Outcome: Progressing   Problem: Education: Goal: Knowledge of General Education information will improve Description: Including pain rating scale, medication(s)/side effects and non-pharmacologic comfort measures Outcome: Progressing   Problem: Health Behavior/Discharge Planning: Goal: Ability to manage health-related needs will improve Outcome: Progressing   Problem: Clinical Measurements: Goal: Ability to maintain clinical measurements within normal limits will improve Outcome: Progressing Goal: Will remain free from infection Outcome: Progressing Goal: Diagnostic test results will improve Outcome: Progressing   Problem: Activity: Goal: Risk for activity intolerance will decrease Outcome: Progressing

## 2023-01-28 NOTE — Progress Notes (Signed)
4 Days Post-Op  Subjective: Patient has no significant abdominal pain.  She is still having some leakage around the Foley.  Objective: Vital signs in last 24 hours: Temp:  [98.5 F (36.9 C)-98.8 F (37.1 C)] 98.5 F (36.9 C) (12/22 0602) Pulse Rate:  [84-90] 84 (12/22 0602) Resp:  [14-17] 16 (12/22 0602) BP: (99-121)/(62-70) 117/62 (12/22 0602) SpO2:  [95 %-98 %] 98 % (12/22 0602) Last BM Date : 01/27/23  Intake/Output from previous day: 12/21 0701 - 12/22 0700 In: 781.7 [P.O.:480; IV Piggyback:301.7] Out: 500 [Urine:500] Intake/Output this shift: No intake/output data recorded.  General appearance: alert, cooperative, and no distress Resp: clear to auscultation bilaterally Cardio: regular rate and rhythm, S1, S2 normal, no murmur, click, rub or gallop GI: Soft, incision healing well.  Ostomy patent.  No leakage noted from colostomy.  Lab Results:  Recent Labs    01/26/23 0359 01/26/23 0619 01/27/23 0417  WBC 8.3  --  7.7  HGB 6.4* 6.4* 7.3*  HCT 19.6* 19.8* 23.4*  PLT 200  --  184   BMET Recent Labs    01/26/23 0359 01/27/23 0417  NA 128* 129*  K 3.8 3.0*  CL 94* 97*  CO2 24 22  GLUCOSE 117* 98  BUN 36* 32*  CREATININE 3.26* 2.90*  CALCIUM 8.3* 8.0*   PT/INR No results for input(s): "LABPROT", "INR" in the last 72 hours.  Studies/Results: No results found.  Anti-infectives: Anti-infectives (From admission, onward)    Start     Dose/Rate Route Frequency Ordered Stop   01/24/23 2200  cefoTEtan (CEFOTAN) 2 g in sodium chloride 0.9 % 100 mL IVPB        2 g 200 mL/hr over 30 Minutes Intravenous Every 12 hours 01/24/23 1602 01/27/23 1004   01/24/23 0730  cefoTEtan (CEFOTAN) 2 g in sodium chloride 0.9 % 100 mL IVPB        2 g 200 mL/hr over 30 Minutes Intravenous On call to O.R. 01/24/23 4098 01/24/23 0846   01/24/23 0718  sodium chloride 0.9 % with cefoTEtan (CEFOTAN) ADS Med       Note to Pharmacy: Waynard Edwards S: cabinet override      01/24/23  0718 01/24/23 0857       Assessment/Plan: s/p Procedure(s): EXPLORATORY LAPAROTOMY RIGHT URETERAL STENT PLACEMENT; RIGHT URETERONEOCYSTOTOMY WITH PSOAS HITCH SIGMOID COLECTOMY WITH END COLOSTOMY Impression: Stable on postoperative day 4.  Still having some bladder spasms.  Tolerating regular diet well.  Labs are still pending as phlebotomy was unable to get sample.  Patient is ambulating.  Appreciate ostomy nurse input.  Instruction to continue tomorrow.  LOS: 4 days    Kelli Hurley 01/28/2023

## 2023-01-29 LAB — CBC
HCT: 23.6 % — ABNORMAL LOW (ref 36.0–46.0)
Hemoglobin: 7.8 g/dL — ABNORMAL LOW (ref 12.0–15.0)
MCH: 30.6 pg (ref 26.0–34.0)
MCHC: 33.1 g/dL (ref 30.0–36.0)
MCV: 92.5 fL (ref 80.0–100.0)
Platelets: 234 10*3/uL (ref 150–400)
RBC: 2.55 MIL/uL — ABNORMAL LOW (ref 3.87–5.11)
RDW: 15.9 % — ABNORMAL HIGH (ref 11.5–15.5)
WBC: 4.4 10*3/uL (ref 4.0–10.5)
nRBC: 0 % (ref 0.0–0.2)

## 2023-01-29 LAB — BASIC METABOLIC PANEL
Anion gap: 8 (ref 5–15)
BUN: 27 mg/dL — ABNORMAL HIGH (ref 8–23)
CO2: 24 mmol/L (ref 22–32)
Calcium: 8.3 mg/dL — ABNORMAL LOW (ref 8.9–10.3)
Chloride: 104 mmol/L (ref 98–111)
Creatinine, Ser: 2.37 mg/dL — ABNORMAL HIGH (ref 0.44–1.00)
GFR, Estimated: 22 mL/min — ABNORMAL LOW (ref >60)
Glucose, Bld: 100 mg/dL — ABNORMAL HIGH (ref 70–99)
Potassium: 3.5 mmol/L (ref 3.5–5.1)
Sodium: 136 mmol/L (ref 135–145)

## 2023-01-29 LAB — GLUCOSE, CAPILLARY
Glucose-Capillary: 87 mg/dL (ref 70–99)
Glucose-Capillary: 115 mg/dL — ABNORMAL HIGH (ref 70–99)

## 2023-01-29 MED ORDER — OXYCODONE HCL 5 MG PO TABS: 5.0000 mg | ORAL_TABLET | ORAL | 0 refills | Status: DC | PRN

## 2023-01-29 MED ORDER — HEPARIN SOD (PORK) LOCK FLUSH 10 UNIT/ML IV SOLN
10.0000 [IU] | Freq: Once | INTRAVENOUS | Status: AC
  Administered 2023-01-29: 10 [IU]
  Filled 2023-01-29: qty 1

## 2023-01-29 MED ORDER — DOCUSATE SODIUM 100 MG PO CAPS: 100.0000 mg | ORAL_CAPSULE | Freq: Two times a day (BID) | ORAL | 0 refills | Status: AC

## 2023-01-29 MED ORDER — MIRABEGRON ER 25 MG PO TB24: 25.0000 mg | ORAL_TABLET | Freq: Every day | ORAL | 0 refills | Status: AC

## 2023-01-29 MED ORDER — HEPARIN SOD (PORK) LOCK FLUSH 10 UNIT/ML IV SOLN: 10.0000 [IU] | Freq: Once | INTRAVENOUS | Status: DC

## 2023-01-29 MED ORDER — METHOCARBAMOL 500 MG PO TABS: 500.0000 mg | ORAL_TABLET | Freq: Three times a day (TID) | ORAL | 0 refills | Status: AC | PRN

## 2023-01-29 MED ORDER — HEPARIN SOD (PORK) LOCK FLUSH 100 UNIT/ML IV SOLN
500.0000 [IU] | Freq: Once | INTRAVENOUS | Status: AC
  Administered 2023-01-29: 500 [IU] via INTRAVENOUS
  Filled 2023-01-29: qty 5

## 2023-01-29 MED ORDER — ONDANSETRON 4 MG PO TBDP: 4.0000 mg | ORAL_TABLET | Freq: Four times a day (QID) | ORAL | 0 refills | Status: AC | PRN

## 2023-01-29 NOTE — Consult Note (Signed)
WOC Nurse ostomy follow up Stoma type/location: LLQ end colostomy  Stomal assessment/size: 1 1/2" slightly oval, well budded, thin layer of brown necrotic tissue covering about 50% of stoma but underneath red and moist  Peristomal assessment: mild dip in abdomen at 9 o'clock when lying, per previous note worsens with sitting to 7-11 o'clock Treatment options for stomal/peristomal skin: 1 2" barrier ring encompassing stoma with another 1/2 placed from 7-11 o'clock  Output minimal liquid green in pouch at this time  Ostomy pouching: 2 piece soft convex 2 1/4" [place] [phone]) 2 1/4" pouch [place] [phone]) and 2" barrier ring [place] [phone])  Education provided: Reviewed emptying when 1/3 to 1/2 full. Daughter demonstrated emptying using lock and roll mechanism and cleaning spout with toilet paper wick.  Reviewed changing entire pouching system 2 times a week or as needed for leaking. Daughter removed old pouching system (had remained intact with no leak after Saturday change). Cleaned around stoma with water moistened washcloth only. Measured at 1 1/2" mostly round today, very slightly oval with extension of stoma at 9 o'clock.  Daughter placed 2" barrier ring snugly around stoma then another 1/2 of a 2" barrier ring from 7 to 11 o'clock to fill in area of creasing.  Daughter cut new skin barrier at 1 1/2" round.  I then widened out slightly on side.  We discussed making sure skin barrier is smooth with cutting.  Daughter attached new pouch to skin barrier, pulled off plastic backing and placed on top of barrier rings.  Daughter closed new pouch.   Answered patient and families questions.  I went to materials and got (2) 1 piece convex pouches for patient to make a total of (8) pouches for home.  Reviewed with daughter the only difference is she does not have to snap pouch onto skin barrier as it is a one piece. Patient is wearing ostomy belt at time of this visit and I recommended she continue with this at  home.   Enrolled patient in [facility] [facility] Start Discharge program: Yes previously.    WOC team will continue to follow for support and education as long as remains inpatient. Patient does anticipate discharge today and has supplies in patient belonging bag for home. I did review the ostomy clinic information as well.    Thank you,    Priscella Mann MSN, RN-BC, [employer] [phone]

## 2023-01-29 NOTE — Care Management Important Message (Signed)
Important Message  Patient Details  Name: Kelli Hurley MRN: [id number] Date of Birth: 06/10/1955   Important Message Given:  Other (see comment) (copy provided 12/20, no additional copy needed per family in room)     Corey Harold 01/29/2023, 1:13 PM

## 2023-01-29 NOTE — Plan of Care (Signed)
  Problem: Education: Goal: Understanding of discharge needs will improve Outcome: Progressing Goal: Verbalization of understanding of the causes of altered bowel function will improve Outcome: Progressing   Problem: Activity: Goal: Ability to tolerate increased activity will improve Outcome: Progressing   Problem: Bowel/Gastric: Goal: Gastrointestinal status for postoperative course will improve Outcome: Progressing   Problem: Health Behavior/Discharge Planning: Goal: Identification of community resources to assist with postoperative recovery needs will improve Outcome: Progressing   Problem: Nutritional: Goal: Will attain and maintain optimal nutritional status will improve Outcome: Progressing   Problem: Clinical Measurements: Goal: Postoperative complications will be avoided or minimized Outcome: Progressing   Problem: Respiratory: Goal: Respiratory status will improve Outcome: Progressing   Problem: Education: Goal: Knowledge of General Education information will improve Description: Including pain rating scale, medication(s)/side effects and non-pharmacologic comfort measures Outcome: Progressing   Problem: Health Behavior/Discharge Planning: Goal: Ability to manage health-related needs will improve Outcome: Progressing   Problem: Clinical Measurements: Goal: Ability to maintain clinical measurements within normal limits will improve Outcome: Progressing Goal: Will remain free from infection Outcome: Progressing Goal: Diagnostic test results will improve Outcome: Progressing Goal: Cardiovascular complication will be avoided Outcome: Progressing   Problem: Activity: Goal: Risk for activity intolerance will decrease Outcome: Progressing   Problem: Nutrition: Goal: Adequate nutrition will be maintained Outcome: Progressing   Problem: Coping: Goal: Level of anxiety will decrease Outcome: Progressing   Problem: Elimination: Goal: Will not experience  complications related to bowel motility Outcome: Progressing

## 2023-01-29 NOTE — Discharge Summary (Signed)
Physician Discharge Summary  Patient ID: MEDRITH CLABO MRN: [id number] DOB/AGE: 1955/03/23 67 y.o.  Admit date: 01/24/2023 Discharge date: 01/29/2023  Admission Diagnoses: Colovaginal fistula   Discharge Diagnoses:  Principal Problem:   Colovaginal fistula Active Problems:   Leukopenia   Multiple myeloma (HCC)   Claustrophobia   Morbid obesity with BMI of 50.0-59.9, adult (HCC)   Nocturnal hypoxemia   H/O autologous stem cell transplant (HCC)   Essential hypertension   CKD (chronic kidney disease) stage 3, GFR 30-59 ml/min (HCC)   Anemia in neoplastic disease   Heart failure with preserved ejection fraction (HCC)   OSA (obstructive sleep apnea)   Paroxysmal atrial flutter (HCC)   Acute postoperative anemia due to expected blood loss   Acute renal failure superimposed on stage 4 chronic kidney disease (HCC)   Right ureteral injury   Discharged Condition: good  Hospital Course: Ms. Seeburger is a 67 yo who has a colovaginal fistula. She has colostomy and colectomy, repair of right ureter on 01/24/2203. She was managed post operatively for pain control, had her diet slowly advanced, and had a foley catheter that will remain in place until she is seen by Urology. She had a JP drain that was removed that was overlying this repair and it was putting out minimally and Dr. Ronne Binning had it removed over the weekend by Dr. Lovell Sheehan. She has been eating, ambulating, and having adequate pain control.   She had to receive 2U pRBC due to acute on chronic anemia related to post operative bleeding and her chronic anemia of disease. She has been working with therapy and the wound rns and can care for her ostomy.   She is ready to go home with her foley catheter and colostomy supplies. HH RN and PT has been ordered. Will plan to hold her Eliquis until recheck labs next week. She has been in Sinus rhythm. Discussed plan with hospitalist who is in agreement with the Eliquis plan.   Consults:  urology and hospitalist   Significant Diagnostic Studies:   Latest Reference Range & Units 01/29/23 03:54  Sodium 135 - 145 mmol/L 136  Potassium 3.5 - 5.1 mmol/L 3.5  Chloride 98 - 111 mmol/L 104  CO2 22 - 32 mmol/L 24  Glucose 70 - 99 mg/dL 956 (H)  BUN 8 - 23 mg/dL 27 (H)  Creatinine 3.87 - 1.00 mg/dL 5.64 (H)  Calcium 8.9 - 10.3 mg/dL 8.3 (L)  Anion gap 5 - 15  8  GFR, Estimated >60 mL/min 22 (L)  (H): Data is abnormally high (L): Data is abnormally low   Latest Reference Range & Units 01/29/23 03:54  WBC 4.0 - 10.5 K/uL 4.4  RBC 3.87 - 5.11 MIL/uL 2.55 (L)  Hemoglobin 12.0 - 15.0 g/dL 7.8 (L)  HCT 33.2 - 95.1 % 23.6 (L)  MCV 80.0 - 100.0 fL 92.5  MCH 26.0 - 34.0 pg 30.6  MCHC 30.0 - 36.0 g/dL 88.4  RDW 16.6 - 06.3 % 15.9 (H)  Platelets 150 - 400 K/uL 234  nRBC 0.0 - 0.2 % 0.0  (L): Data is abnormally low (H): Data is abnormally high Treatments: IV hydration, therapies: PT, and colectomy, colostomy, R ureter repair 12/18   Discharge Exam: Blood pressure 115/65, pulse 85, temperature 98.7 F (37.1 C), temperature source Oral, resp. rate 20, height 5\' 2"  (1.575 m), weight 133.8 kg, SpO2 95%. General appearance: alert and no distress Resp: normal work of breathing GI: soft, appropriately tender, staples c/d/I with no erythema or  drainage, colostomy with edema, minor sloughing of mucosa   Disposition: Discharge disposition: 01-Home or Self Care       Discharge Instructions     Call MD for:  difficulty breathing, headache or visual disturbances   Complete by: As directed    Call MD for:  extreme fatigue   Complete by: As directed    Call MD for:  persistant dizziness or light-headedness   Complete by: As directed    Call MD for:  persistant nausea and vomiting   Complete by: As directed    Call MD for:  redness, tenderness, or signs of infection (pain, swelling, redness, odor or green/yellow discharge around incision site)   Complete by: As directed    Call MD  for:  severe uncontrolled pain   Complete by: As directed    Call MD for:  temperature >100.4   Complete by: As directed    Increase activity slowly   Complete by: As directed       Allergies as of 01/29/2023       Reactions   Diclofenac Swelling   Per pt, facial swelling Other Reaction(s): Other (See Comments) Per pt, facial swelling, Per pt, facial swelling        Medication List     PAUSE taking these medications    apixaban 5 MG Tabs tablet Wait to take this until your doctor or other care provider tells you to start again. Commonly known as: ELIQUIS Take 1 tablet (5 mg total) by mouth 2 (two) times daily.   dexamethasone 4 MG tablet Wait to take this until your doctor or other care provider tells you to start again. Commonly known as: DECADRON TAKE FIVE TABLETS BY MOUTH ONCE A WEEK       TAKE these medications    acetaminophen 650 MG CR tablet Commonly known as: TYLENOL Take 650 mg by mouth every 8 (eight) hours as needed for pain.   albuterol 108 (90 Base) MCG/ACT inhaler Commonly known as: VENTOLIN HFA Inhale 2 puffs into the lungs every 6 (six) hours as needed for wheezing or shortness of breath.   Dermacloud Oint Apply 1 Application topically daily as needed (for skin irritation).   docusate sodium 100 MG capsule Commonly known as: COLACE Take 1 capsule (100 mg total) by mouth 2 (two) times daily.   furosemide 20 MG tablet Commonly known as: LASIX Take 40 mg by mouth 2 (two) times daily.   gabapentin 300 MG capsule Commonly known as: NEURONTIN Take 300 mg by mouth 3 (three) times daily.   lidocaine-prilocaine cream Commonly known as: EMLA APPLY 1 QUARTER SIZED AMOUNT 1 HOUR PRIOR TO TREATMENT What changed:  how much to take how to take this when to take this   magnesium oxide 400 (240 Mg) MG tablet Commonly known as: MAG-OX TAKE 1 TABLET BY MOUTH TWICE DAILY What changed: when to take this   methocarbamol 500 MG tablet Commonly  known as: ROBAXIN Take 1 tablet (500 mg total) by mouth every 8 (eight) hours as needed for muscle spasms.   metolazone 2.5 MG tablet Commonly known as: ZAROXOLYN Take 1 tablet (2.5 mg total) by mouth daily. What changed:  when to take this additional instructions   mirabegron ER 25 MG Tb24 tablet Commonly known as: MYRBETRIQ Take 1 tablet (25 mg total) by mouth daily. Start taking on: January 30, 2023   ondansetron 4 MG disintegrating tablet Commonly known as: ZOFRAN-ODT Take 1 tablet (4 mg total) by mouth every  6 (six) hours as needed for nausea.   oxyCODONE 5 MG immediate release tablet Commonly known as: Oxy IR/ROXICODONE Take 1 tablet (5 mg total) by mouth every 4 (four) hours as needed for severe pain (pain score 7-10) or breakthrough pain.   oxyCODONE-acetaminophen 5-325 MG tablet Commonly known as: PERCOCET/ROXICET Take 1 tablet by mouth every 12 (twelve) hours as needed for severe pain (pain score 7-10). TAKE 1 OR 2 TABLETS BY MOUTH EVERY TWELVE HOURS AS NEEDED for severe pain What changed:  how much to take additional instructions   Ozempic (2 MG/DOSE) 8 MG/3ML Sopn Generic drug: Semaglutide (2 MG/DOSE) Inject 2 mg into the skin once a week.   pomalidomide 4 MG capsule Commonly known as: POMALYST Take 1 capsule (4 mg total) by mouth daily. 21 days on, 7 days off   potassium chloride SA 20 MEQ tablet Commonly known as: KLOR-CON M TAKE 1 TABLET BY MOUTH THREE TIMES DAILY What changed:  how much to take how to take this when to take this additional instructions   pregabalin 75 MG capsule Commonly known as: Lyrica Take 1 capsule (75 mg total) by mouth 2 (two) times daily.   rosuvastatin 10 MG tablet Commonly known as: CRESTOR TAKE ONE TABLET BY MOUTH ONCE DAILY   Tab-A-Vite Tabs TAKE 1 TABLET BY MOUTH ONCE DAILY.   trolamine salicylate 10 % cream Commonly known as: ASPERCREME Apply 1 application topically 2 (two) times daily as needed for muscle  pain.   VITAMIN D PO Take by mouth.        Follow-up Information     [facility] Follow up.   Why: [facility] staff will call you to schedule in home nursing visits        Lucretia Roers, MD Follow up on 02/08/2023.   Specialty: General Surgery Why: staple removal Contact information: [address] [place] [facility] [address] [phone]                Discharge Instructions After Colostomy :  Medications: We are going to hold your Eliquis until next week. Next week go to [facility] and get lab work to check your Hgb levels and renal function. Go get these labs before 10AM. If there is any issues with the lab call the office to make sure the orders are in appropriately.  You can restart your Ozempic 12/24 or if you prefer you can restart it next week (it could make you feel nauseated- you will know best if you are able to restart this tomorrow).  Take the bladder spasm medication daily for now.   Common Complaints: Pain at the incision site is common. This will improve with time. Take your pain medications as described below. Some nausea is common and poor appetite. The main goal is to stay hydrated the first few days after surgery.   Diet/ Activity: Diet as tolerated. You have started and tolerated a diet in the hospital, and should continue to increase what you are able to eat.   You may not have a large appetite, but it is important to stay hydrated. Drink 64 ounces of water a day. Your appetite will return with time.  You can leave your staples open to air or cover with a dry gauze daily as desired.  Shower per your regular routine daily.   Colostomy care per the Rns instructions. Change the bag as instructed. Foley Care per the RN instructions. You will be provided with a leg bag for  the day and a larger bag at night.  Rest and listen to your body, but do not remain in bed all day.  Walk everyday for at least 15-20 minutes. Deep cough  and move around every 1-2 hours in the first few days after surgery.  Do not lift > 10 lbs, perform excessive bending, pushing, pulling, squatting for 6-8 weeks after surgery.  You can remove the bandage from the JP drain removal after 01/30/23.  Do not place lotions or balms on your incision unless instructed to specifically by Dr. Henreitta Leber.   Pain Expectations and Narcotics: -After surgery you will have pain associated with your incisions and this is normal. The pain is muscular and nerve pain, and will get better with time. -You are encouraged and expected to take non narcotic medications like tylenol and ibuprofen (when able) to treat pain as multiple modalities can aid with pain treatment. -Narcotics are only used when pain is severe or there is breakthrough pain. -You are not expected to have a pain score of 0 after surgery, as we cannot prevent pain. A pain score of 3-4 that allows you to be functional, move, walk, and tolerate some activity is the goal. The pain will continue to improve over the days after surgery and is dependent on your surgery. -Due to [place] law, we are only able to give a certain amount of pain medication to treat post operative pain, and we only give additional narcotics on a patient by patient basis.  -For most laparoscopic surgery, studies have shown that the majority of patients only need 10-15 narcotic pills, and for open surgeries most patients only need 15-20.   -Having appropriate expectations of pain and knowledge of pain management with non narcotics is important as we do not want anyone to become addicted to narcotic pain medication.  -Using ice packs in the first 48 hours and heating pads after 48 hours, wearing an abdominal binder (when recommended), and using over the counter medications are all ways to help with pain management.   -Simple acts like meditation and mindfulness practices after surgery can also help with pain control and research has proven the  benefit of these practices.  Medication: Take tylenol as needed for pain control. Tylenol 1000mg  @ 6am, 12noon, 6pm, (Do not exceed 4000mg  of tylenol a day).  Take Roxicodone for breakthrough pain every 4 hours.  Take Colace for constipation related to narcotic pain medication. If you do not have a bowel movement in 2 days, take Miralax over the counter.  Drink plenty of water to also prevent constipation.   Contact Information: If you have questions or concerns, please call our office, [phone], Monday- Thursday 8AM-5PM and Friday 8AM-12Noon.  If it is after hours or on the weekend, please call [facility] Main Number, [phone], [phone] and ask to speak to the surgeon on call for Dr. Henreitta Leber at [facility]    Future Appointments  Date Time Provider Department Center  02/08/2023  1:45 PM Lucretia Roers, MD RS-RS None  03/13/2023  9:30 AM Marcine Matar, MD [facility] None  03/13/2023 10:45 AM Oretha Milch, MD [facility]  03/14/2023 11:15 AM AP[facility]ACAPA NURSE [facility] None  03/21/2023 11:30 AM Doreatha Massed, MD [facility] None    Signed: Lucretia Roers 01/29/2023, 11:20 AM

## 2023-01-29 NOTE — Discharge Instructions (Addendum)
Discharge Instructions After Colostomy :  Medications: We are going to hold your Eliquis until next week. Next week go to [facility] and get lab work to check your Hgb levels and renal function. Go get these labs before 10AM. If there is any issues with the lab call the office to make sure the orders are in appropriately.  You can restart your Ozempic 12/24 or if you prefer you can restart it next week (it could make you feel nauseated- you will know best if you are able to restart this tomorrow).  Take the bladder spasm medication daily for now.   Common Complaints: Pain at the incision site is common. This will improve with time. Take your pain medications as described below. Some nausea is common and poor appetite. The main goal is to stay hydrated the first few days after surgery.   Diet/ Activity: Diet as tolerated. You have started and tolerated a diet in the hospital, and should continue to increase what you are able to eat.   You may not have a large appetite, but it is important to stay hydrated. Drink 64 ounces of water a day. Your appetite will return with time.  You can leave your staples open to air or cover with a dry gauze daily as desired.  Shower per your regular routine daily.   Colostomy care per the Rns instructions. Change the bag as instructed. Foley Care per the RN instructions. You will be provided with a leg bag for the day and a larger bag at night.  Rest and listen to your body, but do not remain in bed all day.  Walk everyday for at least 15-20 minutes. Deep cough and move around every 1-2 hours in the first few days after surgery.  Do not lift > 10 lbs, perform excessive bending, pushing, pulling, squatting for 6-8 weeks after surgery.  You can remove the bandage from the JP drain removal after 01/30/23.  Do not place lotions or balms on your incision unless instructed to specifically by Dr. Henreitta Leber.   Pain Expectations and Narcotics: -After surgery you will  have pain associated with your incisions and this is normal. The pain is muscular and nerve pain, and will get better with time. -You are encouraged and expected to take non narcotic medications like tylenol and ibuprofen (when able) to treat pain as multiple modalities can aid with pain treatment. -Narcotics are only used when pain is severe or there is breakthrough pain. -You are not expected to have a pain score of 0 after surgery, as we cannot prevent pain. A pain score of 3-4 that allows you to be functional, move, walk, and tolerate some activity is the goal. The pain will continue to improve over the days after surgery and is dependent on your surgery. -Due to [place] law, we are only able to give a certain amount of pain medication to treat post operative pain, and we only give additional narcotics on a patient by patient basis.  -For most laparoscopic surgery, studies have shown that the majority of patients only need 10-15 narcotic pills, and for open surgeries most patients only need 15-20.   -Having appropriate expectations of pain and knowledge of pain management with non narcotics is important as we do not want anyone to become addicted to narcotic pain medication.  -Using ice packs in the first 48 hours and heating pads after 48 hours, wearing an abdominal binder (when recommended), and using over the counter medications are all ways  to help with pain management.   -Simple acts like meditation and mindfulness practices after surgery can also help with pain control and research has proven the benefit of these practices.  Medication: Take tylenol as needed for pain control. Tylenol 1000mg  @ 6am, 12noon, 6pm, (Do not exceed 4000mg  of tylenol a day).  Take Roxicodone for breakthrough pain every 4 hours.  Take Colace for constipation related to narcotic pain medication. If you do not have a bowel movement in 2 days, take Miralax over the counter.  Drink plenty of water to also prevent  constipation.   Contact Information: If you have questions or concerns, please call our office, [phone], Monday- Thursday 8AM-5PM and Friday 8AM-12Noon.  If it is after hours or on the weekend, please call [facility] Main Number, [phone], [phone] and ask to speak to the surgeon on call for Dr. Henreitta Leber at [facility].

## 2023-02-01 ENCOUNTER — Other Ambulatory Visit: Payer: Self-pay | Admitting: Hematology

## 2023-02-05 ENCOUNTER — Ambulatory Visit (HOSPITAL_COMMUNITY)
Admission: RE | Admit: 2023-02-05 | Discharge: 2023-02-05 | Disposition: A | Discharge status: Home / Self Care | Payer: 59 | Source: Ambulatory Visit | Attending: *Deleted | Admitting: *Deleted

## 2023-02-05 ENCOUNTER — Encounter (HOSPITAL_COMMUNITY): Payer: Self-pay | Admitting: Hematology

## 2023-02-05 ENCOUNTER — Other Ambulatory Visit: Payer: Self-pay

## 2023-02-05 ENCOUNTER — Other Ambulatory Visit (HOSPITAL_COMMUNITY)
Admission: RE | Admit: 2023-02-05 | Discharge: 2023-02-05 | Disposition: A | Discharge status: Home / Self Care | Payer: 59 | Source: Ambulatory Visit | Attending: Internal Medicine | Admitting: Internal Medicine

## 2023-02-05 ENCOUNTER — Other Ambulatory Visit: Payer: Self-pay | Admitting: *Deleted

## 2023-02-05 DIAGNOSIS — N183 Chronic kidney disease, stage 3 unspecified: Secondary | ICD-10-CM | POA: Insufficient documentation

## 2023-02-05 DIAGNOSIS — D72819 Decreased white blood cell count, unspecified: Secondary | ICD-10-CM | POA: Insufficient documentation

## 2023-02-05 DIAGNOSIS — S3710XA Unspecified injury of ureter, initial encounter: Secondary | ICD-10-CM

## 2023-02-05 DIAGNOSIS — Z433 Encounter for attention to colostomy: Secondary | ICD-10-CM | POA: Insufficient documentation

## 2023-02-05 DIAGNOSIS — L24B3 Irritant contact dermatitis related to fecal or urinary stoma or fistula: Secondary | ICD-10-CM | POA: Diagnosis not present

## 2023-02-05 DIAGNOSIS — N823 Fistula of vagina to large intestine: Secondary | ICD-10-CM | POA: Insufficient documentation

## 2023-02-05 DIAGNOSIS — Z7189 Other specified counseling: Secondary | ICD-10-CM | POA: Diagnosis present

## 2023-02-05 LAB — COMPREHENSIVE METABOLIC PANEL
ALT: 18 U/L (ref 0–44)
AST: 17 U/L (ref 15–41)
Albumin: 2.9 g/dL — ABNORMAL LOW (ref 3.5–5.0)
Alkaline Phosphatase: 63 U/L (ref 38–126)
Anion gap: 11 (ref 5–15)
BUN: 23 mg/dL (ref 8–23)
CO2: 27 mmol/L (ref 22–32)
Calcium: 9.5 mg/dL (ref 8.9–10.3)
Chloride: 100 mmol/L (ref 98–111)
Creatinine, Ser: 1.88 mg/dL — ABNORMAL HIGH (ref 0.44–1.00)
GFR, Estimated: 29 mL/min — ABNORMAL LOW (ref >60)
Glucose, Bld: 107 mg/dL — ABNORMAL HIGH (ref 70–99)
Potassium: 4.1 mmol/L (ref 3.5–5.1)
Sodium: 138 mmol/L (ref 135–145)
Total Bilirubin: 0.6 mg/dL (ref 0.0–1.2)
Total Protein: 7 g/dL (ref 6.5–8.1)

## 2023-02-05 LAB — CBC WITH DIFFERENTIAL/PLATELET
Abs Immature Granulocytes: 0.26 10*3/uL — ABNORMAL HIGH (ref 0.00–0.07)
Basophils Absolute: 0.1 10*3/uL (ref 0.0–0.1)
Basophils Relative: 1 %
Eosinophils Absolute: 0.1 10*3/uL (ref 0.0–0.5)
Eosinophils Relative: 2 %
HCT: 27.4 % — ABNORMAL LOW (ref 36.0–46.0)
Hemoglobin: 8.6 g/dL — ABNORMAL LOW (ref 12.0–15.0)
Immature Granulocytes: 3 %
Lymphocytes Relative: 14 %
Lymphs Abs: 1.3 10*3/uL (ref 0.7–4.0)
MCH: 29.4 pg (ref 26.0–34.0)
MCHC: 31.4 g/dL (ref 30.0–36.0)
MCV: 93.5 fL (ref 80.0–100.0)
Monocytes Absolute: 0.7 10*3/uL (ref 0.1–1.0)
Monocytes Relative: 7 %
Neutro Abs: 6.3 10*3/uL (ref 1.7–7.7)
Neutrophils Relative %: 73 %
Platelets: 373 10*3/uL (ref 150–400)
RBC: 2.93 MIL/uL — ABNORMAL LOW (ref 3.87–5.11)
RDW: 15.9 % — ABNORMAL HIGH (ref 11.5–15.5)
WBC: 8.8 10*3/uL (ref 4.0–10.5)
nRBC: 0 % (ref 0.0–0.2)

## 2023-02-05 NOTE — Discharge Instructions (Signed)
Switching to 1 piece flexible convex  [employer] 8958 Stoma powder and skin prep to separation and peristomal skin Barrier ring (to crease and around stoma)  Barrier strips to edge of adhesive Ostomy belt. Goal for now, change every other day.

## 2023-02-05 NOTE — Progress Notes (Incomplete)
[facility]   Reason for visit:  *** HPI:  *** Past Medical History:  Diagnosis Date   Anemia    Arthritis    Asthma    Cancer (HCC)    Claustrophobia 10/05/2014   Diabetes mellitus without complication (HCC)    Dyspnea    Hypertension    Leukopenia 08/03/2014   Normocytic hypochromic anemia 08/03/2014   Renal disorder    stage 3    Sleep apnea    Family History  Problem Relation Age of Onset   Cancer Mother    Hypertension Mother    Cancer Father    Hypertension Father    Hypertension Sister    Hypertension Sister    Hypertension Sister    Hypertension Sister    Cancer Maternal Grandmother    Cancer Cousin    Hypertension Brother    Prostate cancer Brother    Hypertension Brother    Hypertension Brother    Colon cancer Neg Hx    Colon polyps Neg Hx    Allergies  Allergen Reactions   Diclofenac Swelling    Per pt, facial swelling  Other Reaction(s): Other (See Comments)  Per pt, facial swelling, Per pt, facial swelling   Current Outpatient Medications  Medication Sig Dispense Refill Last Dose/Taking   acetaminophen (TYLENOL) 650 MG CR tablet Take 650 mg by mouth every 8 (eight) hours as needed for pain.      albuterol (VENTOLIN HFA) 108 (90 Base) MCG/ACT inhaler Inhale 2 puffs into the lungs every 6 (six) hours as needed for wheezing or shortness of breath. 8 g 3    [Paused] apixaban (ELIQUIS) 5 MG TABS tablet Take 1 tablet (5 mg total) by mouth 2 (two) times daily. 60 tablet 3    [Paused] dexamethasone (DECADRON) 4 MG tablet TAKE FIVE TABLETS BY MOUTH ONCE A WEEK (Patient taking differently: Take 20 mg by mouth once a week.) 30 tablet 6    docusate sodium (COLACE) 100 MG capsule Take 1 capsule (100 mg total) by mouth 2 (two) times daily. 30 capsule 0    furosemide (LASIX) 20 MG tablet Take 40 mg by mouth 2 (two) times daily.      gabapentin (NEURONTIN) 300 MG capsule Take 300 mg by mouth 3 (three) times daily.      Infant Care Products  [facility]) OINT Apply 1 Application topically daily as needed (for skin irritation).      lidocaine-prilocaine (EMLA) cream APPLY 1 QUARTER SIZED AMOUNT 1 HOUR PRIOR TO TREATMENT (Patient taking differently: Apply 1 Application topically See admin instructions. APPLY 1 QUARTER SIZED AMOUNT 1 HOUR PRIOR TO TREATMENT) 30 g 10    magnesium oxide (MAG-OX) 400 (240 Mg) MG tablet TAKE 1 TABLET BY MOUTH TWICE DAILY (Patient taking differently: Take 400 mg by mouth daily.) 60 tablet 6    methocarbamol (ROBAXIN) 500 MG tablet Take 1 tablet (500 mg total) by mouth every 8 (eight) hours as needed for muscle spasms. 30 tablet 0    metolazone (ZAROXOLYN) 2.5 MG tablet Take 1 tablet (2.5 mg total) by mouth 3 (three) times a week. Mon., Wed., and Friday 30 tablet 4    mirabegron ER (MYRBETRIQ) 25 MG TB24 tablet Take 1 tablet (25 mg total) by mouth daily. 30 tablet 0    Multiple Vitamin (TAB-A-VITE) TABS TAKE 1 TABLET BY MOUTH ONCE DAILY. (Patient taking differently: Take 1 tablet by mouth daily.) 30 tablet 11    ondansetron (ZOFRAN-ODT) 4 MG disintegrating tablet Take 1  tablet (4 mg total) by mouth every 6 (six) hours as needed for nausea. 20 tablet 0    oxyCODONE (OXY IR/ROXICODONE) 5 MG immediate release tablet Take 1 tablet (5 mg total) by mouth every 4 (four) hours as needed for severe pain (pain score 7-10) or breakthrough pain. 20 tablet 0    oxyCODONE-acetaminophen (PERCOCET/ROXICET) 5-325 MG tablet Take 1 tablet by mouth every 12 (twelve) hours as needed for severe pain (pain score 7-10). TAKE 1 OR 2 TABLETS BY MOUTH EVERY TWELVE HOURS AS NEEDED for severe pain (Patient taking differently: Take 1-2 tablets by mouth every 12 (twelve) hours as needed for severe pain (pain score 7-10).) 60 tablet 0    pomalidomide (POMALYST) 4 MG capsule Take 1 capsule (4 mg total) by mouth daily. 21 days on, 7 days off 21 capsule 0    potassium chloride SA (KLOR-CON M) 20 MEQ tablet TAKE 1 TABLET BY MOUTH THREE TIMES DAILY  (Patient taking differently: Take 40 mEq by mouth daily.) 90 tablet 10    pregabalin (LYRICA) 75 MG capsule Take 1 capsule (75 mg total) by mouth 2 (two) times daily. 60 capsule 3    rosuvastatin (CRESTOR) 10 MG tablet TAKE ONE TABLET BY MOUTH ONCE DAILY 90 tablet 3    Semaglutide, 2 MG/DOSE, (OZEMPIC, 2 MG/DOSE,) 8 MG/3ML SOPN Inject 2 mg into the skin once a week. (Patient taking differently: Inject 2 mg into the skin every Tuesday.) 3 mL 11    trolamine salicylate (ASPERCREME) 10 % cream Apply 1 application topically 2 (two) times daily as needed for muscle pain.      VITAMIN D PO Take by mouth.      No current facility-administered medications for this encounter.   Facility-Administered Medications Ordered in Other Encounters  Medication Dose Route Frequency Provider Last Rate Last Admin   heparin lock flush 100 unit/mL  500 Units Intravenous Once Penland, Novella Olive, MD       sodium chloride 0.9 % injection 10 mL  10 mL Intravenous Once Penland, Novella Olive, MD       ROS  Review of Systems Vital signs:  BP 125/75   Pulse 99   Temp 97.7 F (36.5 C)   Resp 18   SpO2 98%  Exam:  Physical Exam  Stoma type/location:  *** Stomal assessment/size:  *** Peristomal assessment:  *** Treatment options for stomal/peristomal skin: *** Output: *** Ostomy pouching: 1pc./2pc.  Education provided:  ***    Impression/dx  *** Discussion  *** Plan  ***    Visit time: *** minutes.   Mike Gip FNP-BC

## 2023-02-06 ENCOUNTER — Telehealth: Payer: Self-pay | Admitting: Internal Medicine

## 2023-02-06 NOTE — Telephone Encounter (Signed)
Spoke to pt's daughter and advised her to seek the recommendation of the surgeon who performed pt's exploratory laparotomy and stent placement. Daughter verbalized understanding and had no questions or concerns at this time.

## 2023-02-06 NOTE — Telephone Encounter (Signed)
Discussed with Dr. Henreitta Leber. Patient may restart Eliquis.  Call placed to patient daughter, Marquita [phone]~ telephone. LM on VM that patient may restart Eliquis.

## 2023-02-06 NOTE — Telephone Encounter (Signed)
Pt's daughter was calling to see if the pt can start back on her Eliquis. Please advise

## 2023-02-07 ENCOUNTER — Other Ambulatory Visit (HOSPITAL_COMMUNITY): Payer: Self-pay | Admitting: Nurse Practitioner

## 2023-02-07 DIAGNOSIS — Z433 Encounter for attention to colostomy: Secondary | ICD-10-CM

## 2023-02-07 DIAGNOSIS — L24B3 Irritant contact dermatitis related to fecal or urinary stoma or fistula: Secondary | ICD-10-CM

## 2023-02-08 ENCOUNTER — Encounter: Payer: Self-pay | Admitting: General Surgery

## 2023-02-08 ENCOUNTER — Ambulatory Visit: Payer: 59 | Admitting: General Surgery

## 2023-02-08 ENCOUNTER — Other Ambulatory Visit (HOSPITAL_COMMUNITY)
Admission: RE | Admit: 2023-02-08 | Discharge: 2023-02-08 | Disposition: A | Discharge status: Home / Self Care | Payer: 59 | Source: Ambulatory Visit | Attending: Nephrology | Admitting: Nephrology

## 2023-02-08 VITALS — BP 108/74 | HR 94 | Temp 97.8°F | Resp 15

## 2023-02-08 DIAGNOSIS — R809 Proteinuria, unspecified: Secondary | ICD-10-CM | POA: Insufficient documentation

## 2023-02-08 DIAGNOSIS — N184 Chronic kidney disease, stage 4 (severe): Secondary | ICD-10-CM | POA: Insufficient documentation

## 2023-02-08 DIAGNOSIS — C9000 Multiple myeloma not having achieved remission: Secondary | ICD-10-CM | POA: Insufficient documentation

## 2023-02-08 DIAGNOSIS — E876 Hypokalemia: Secondary | ICD-10-CM | POA: Diagnosis present

## 2023-02-08 DIAGNOSIS — I5032 Chronic diastolic (congestive) heart failure: Secondary | ICD-10-CM | POA: Insufficient documentation

## 2023-02-08 DIAGNOSIS — Z433 Encounter for attention to colostomy: Secondary | ICD-10-CM

## 2023-02-08 LAB — CBC
HCT: 27.4 % — ABNORMAL LOW (ref 36.0–46.0)
Hemoglobin: 8.8 g/dL — ABNORMAL LOW (ref 12.0–15.0)
MCH: 29.9 pg (ref 26.0–34.0)
MCHC: 32.1 g/dL (ref 30.0–36.0)
MCV: 93.2 fL (ref 80.0–100.0)
Platelets: 375 10*3/uL (ref 150–400)
RBC: 2.94 MIL/uL — ABNORMAL LOW (ref 3.87–5.11)
RDW: 16.1 % — ABNORMAL HIGH (ref 11.5–15.5)
WBC: 7.6 10*3/uL (ref 4.0–10.5)
nRBC: 0 % (ref 0.0–0.2)

## 2023-02-08 LAB — RENAL FUNCTION PANEL
Albumin: 3.2 g/dL — ABNORMAL LOW (ref 3.5–5.0)
Anion gap: 12 (ref 5–15)
BUN: 28 mg/dL — ABNORMAL HIGH (ref 8–23)
CO2: 27 mmol/L (ref 22–32)
Calcium: 9.6 mg/dL (ref 8.9–10.3)
Chloride: 99 mmol/L (ref 98–111)
Creatinine, Ser: 2.29 mg/dL — ABNORMAL HIGH (ref 0.44–1.00)
GFR, Estimated: 23 mL/min — ABNORMAL LOW (ref >60)
Glucose, Bld: 100 mg/dL — ABNORMAL HIGH (ref 70–99)
Phosphorus: 3.8 mg/dL (ref 2.5–4.6)
Potassium: 3.9 mmol/L (ref 3.5–5.1)
Sodium: 138 mmol/L (ref 135–145)

## 2023-02-08 LAB — PROTEIN / CREATININE RATIO, URINE
Creatinine, Urine: 76 mg/dL
Protein Creatinine Ratio: 0.96 mg/mg{creat} — ABNORMAL HIGH (ref 0.00–0.15)
Total Protein, Urine: 73 mg/dL

## 2023-02-08 MED ORDER — OXYCODONE HCL 5 MG PO TABS: 5.0000 mg | ORAL_TABLET | ORAL | 0 refills | Status: DC | PRN

## 2023-02-08 NOTE — Progress Notes (Signed)
 [facility]  Having leakage from the ostomy. Eating ok. Working with PT at home. Having to work with Conservator, Museum/gallery at [employer]. She sees them again next week. Changing bag sometimes daily or multiple times daily. HH coming to help patient.  She reports having hard stools and this causing the bag to leak.   Foley in place. She has Appt for Cysto in February.   She is back on her Eliquis  and her h&h was improving on labs this week.   BP 108/74   Pulse 94   Temp 97.8 F (36.6 C)   Resp 15   SpO2 93%  Healing, ostomy with some retraction, wearing a belt  Staples removed, no erythema or drainage, steri strips placed   Patient s/p colectomy, colostomy and ureter/bladder repair. She has pathology still pending and I have called pathology. They are working on it.   Added metamucil /benefiber to your diet daily.  Continue to stool softeners colace 100 mg twice.  Continue with the ostomy care as you have been. Dr. Katragadda says you can hold the chemotherapy medication and the decadron  for 2 more weeks given the bladder/ ureter injury (can restart 02/21/2023)  Dr. Sherrilee has verified that you will keep the foley unfortunately until the cystogram is done with Dr. Wilber Maxwell will peel off in the next 5-7 days. You can remove them once they are peeling off. It is ok to shower. Pat the area dry. Diet as tolerated.   Keep working with Physical therapy. Keep working with the ostomy RN.  Ok to restart the Ozempic    Future Appointments  Date Time Provider Department Center  02/12/2023  1:00 PM Mayo, Darice LABOR, FNP MC-OC None  02/21/2023  1:45 PM Kallie Manuelita BROCKS, MD RS-RS None  03/13/2023  9:30 AM Matilda Senior, MD AUR-AUR None  03/13/2023 10:45 AM Jude Harden GAILS, MD DWB-PUL DWB  03/14/2023 11:15 AM AP-ACAPA NURSE [facility]-AP[facility] None  03/21/2023 11:30 AM Rogers Hai, MD CH[facility]-APCC None   I have called and updated the patient about the above information from Dr.  Rogers and Dr. Sherrilee.   Manuelita Kallie, MD [facility] [address] [place], [place] [address] [phone] (office)

## 2023-02-08 NOTE — Patient Instructions (Addendum)
 Added metamucil /benefiber to your diet daily.  Continue to stool softeners colace 100 mg twice.  Continue with the ostomy care as you have been. Will follow up with Dr. Katragadda and Dr. Sherrilee about the chemotherapy medication and the decadron . Will follow up about the foley catheter (urine catheter) plan with Dr. Sherrilee.   Steristrips will peel off in the next 5-7 days. You can remove them once they are peeling off. It is ok to shower. Pat the area dry. Diet as tolerated.   Keep working with Physical therapy. Keep working with the ostomy RN.

## 2023-02-09 LAB — SURGICAL PATHOLOGY

## 2023-02-12 ENCOUNTER — Other Ambulatory Visit: Payer: Self-pay | Admitting: General Surgery

## 2023-02-12 ENCOUNTER — Ambulatory Visit (HOSPITAL_COMMUNITY): Payer: 59 | Admitting: Nurse Practitioner

## 2023-02-16 ENCOUNTER — Ambulatory Visit (HOSPITAL_COMMUNITY)
Admission: RE | Admit: 2023-02-16 | Discharge: 2023-02-16 | Disposition: A | Discharge status: Home / Self Care | Payer: 59 | Source: Ambulatory Visit | Attending: Plastic Surgery | Admitting: Plastic Surgery

## 2023-02-16 DIAGNOSIS — Z433 Encounter for attention to colostomy: Secondary | ICD-10-CM | POA: Diagnosis present

## 2023-02-16 DIAGNOSIS — L24B3 Irritant contact dermatitis related to fecal or urinary stoma or fistula: Secondary | ICD-10-CM | POA: Diagnosis not present

## 2023-02-16 DIAGNOSIS — K9403 Colostomy malfunction: Secondary | ICD-10-CM | POA: Diagnosis not present

## 2023-02-16 DIAGNOSIS — K9400 Colostomy complication, unspecified: Secondary | ICD-10-CM

## 2023-02-16 NOTE — Progress Notes (Signed)
 [facility]   Reason for visit:  LLQ end colostomy  HPI:  Colovaginal fistula with end colostomy.   Past Medical History:  Diagnosis Date   Anemia    Arthritis    Asthma    Cancer (HCC)    Claustrophobia 10/05/2014   Diabetes mellitus without complication (HCC)    Dyspnea    Hypertension    Leukopenia 08/03/2014   Normocytic hypochromic anemia 08/03/2014   Renal disorder    stage 3    Sleep apnea    Family History  Problem Relation Age of Onset   Cancer Mother    Hypertension Mother    Cancer Father    Hypertension Father    Hypertension Sister    Hypertension Sister    Hypertension Sister    Hypertension Sister    Cancer Maternal Grandmother    Cancer Cousin    Hypertension Brother    Prostate cancer Brother    Hypertension Brother    Hypertension Brother    Colon cancer Neg Hx    Colon polyps Neg Hx    Allergies  Allergen Reactions   Diclofenac  Swelling    Per pt, facial swelling  Other Reaction(s): Other (See Comments)  Per pt, facial swelling, Per pt, facial swelling   Current Outpatient Medications  Medication Sig Dispense Refill Last Dose/Taking   acetaminophen  (TYLENOL ) 650 MG CR tablet Take 650 mg by mouth every 8 (eight) hours as needed for pain.      albuterol  (VENTOLIN  HFA) 108 (90 Base) MCG/ACT inhaler Inhale 2 puffs into the lungs every 6 (six) hours as needed for wheezing or shortness of breath. 8 g 3    [Paused] apixaban  (ELIQUIS ) 5 MG TABS tablet Take 1 tablet (5 mg total) by mouth 2 (two) times daily. 60 tablet 3    [Paused] dexamethasone  (DECADRON ) 4 MG tablet TAKE FIVE TABLETS BY MOUTH ONCE A WEEK (Patient taking differently: Take 20 mg by mouth once a week.) 30 tablet 6    docusate sodium  (COLACE) 100 MG capsule Take 1 capsule (100 mg total) by mouth 2 (two) times daily. 30 capsule 0    furosemide  (LASIX ) 20 MG tablet Take 40 mg by mouth 2 (two) times daily.      gabapentin  (NEURONTIN ) 300 MG capsule Take 300 mg by mouth 3  (three) times daily.      Infant Care Products [facility]) OINT Apply 1 Application topically daily as needed (for skin irritation).      lidocaine -prilocaine  (EMLA ) cream APPLY 1 QUARTER SIZED AMOUNT 1 HOUR PRIOR TO TREATMENT (Patient taking differently: Apply 1 Application topically See admin instructions. APPLY 1 QUARTER SIZED AMOUNT 1 HOUR PRIOR TO TREATMENT) 30 g 10    magnesium  oxide (MAG-OX) 400 (240 Mg) MG tablet TAKE 1 TABLET BY MOUTH TWICE DAILY (Patient taking differently: Take 400 mg by mouth daily.) 60 tablet 6    methocarbamol  (ROBAXIN ) 500 MG tablet Take 1 tablet (500 mg total) by mouth every 8 (eight) hours as needed for muscle spasms. 30 tablet 0    metolazone  (ZAROXOLYN ) 2.5 MG tablet Take 1 tablet (2.5 mg total) by mouth 3 (three) times a week. Mon., Wed., and Friday 30 tablet 4    mirabegron  ER (MYRBETRIQ ) 25 MG TB24 tablet Take 1 tablet (25 mg total) by mouth daily. 30 tablet 0    Multiple Vitamin (TAB-A-VITE) TABS TAKE 1 TABLET BY MOUTH ONCE DAILY. (Patient taking differently: Take 1 tablet by mouth daily.) 30 tablet 11  ondansetron  (ZOFRAN -ODT) 4 MG disintegrating tablet Take 1 tablet (4 mg total) by mouth every 6 (six) hours as needed for nausea. 20 tablet 0    oxyCODONE  (OXY IR/ROXICODONE ) 5 MG immediate release tablet Take 1 tablet (5 mg total) by mouth every 4 (four) hours as needed for severe pain (pain score 7-10) or breakthrough pain. 20 tablet 0    oxyCODONE -acetaminophen  (PERCOCET/ROXICET) 5-325 MG tablet Take 1 tablet by mouth every 12 (twelve) hours as needed for severe pain (pain score 7-10). TAKE 1 OR 2 TABLETS BY MOUTH EVERY TWELVE HOURS AS NEEDED for severe pain (Patient taking differently: Take 1-2 tablets by mouth every 12 (twelve) hours as needed for severe pain (pain score 7-10).) 60 tablet 0    pomalidomide  (POMALYST ) 4 MG capsule Take 1 capsule (4 mg total) by mouth daily. 21 days on, 7 days off 21 capsule 0    potassium chloride  SA (KLOR-CON  M) 20 MEQ  tablet TAKE 1 TABLET BY MOUTH THREE TIMES DAILY (Patient taking differently: Take 40 mEq by mouth daily.) 90 tablet 10    pregabalin  (LYRICA ) 75 MG capsule TAKE ONE CAPSULE BY MOUTH TWICE DAILY 60 capsule 3    rosuvastatin  (CRESTOR ) 10 MG tablet TAKE ONE TABLET BY MOUTH ONCE DAILY 90 tablet 3    Semaglutide , 2 MG/DOSE, (OZEMPIC , 2 MG/DOSE,) 8 MG/3ML SOPN Inject 2 mg into the skin once a week. (Patient taking differently: Inject 2 mg into the skin every Tuesday.) 3 mL 11    trolamine salicylate (ASPERCREME) 10 % cream Apply 1 application topically 2 (two) times daily as needed for muscle pain.      VITAMIN D  PO Take by mouth.      No current facility-administered medications for this encounter.   Facility-Administered Medications Ordered in Other Encounters  Medication Dose Route Frequency Provider Last Rate Last Admin   heparin  lock flush 100 unit/mL  500 Units Intravenous Once Penland, Clotilda POUR, MD       sodium chloride  0.9 % injection 10 mL  10 mL Intravenous Once Penland, Clotilda POUR, MD       ROS  Review of Systems  Constitutional: Negative.   Gastrointestinal:        LMQ colostomy  Musculoskeletal:  Positive for gait problem.  Skin:  Positive for color change, rash and wound.  Psychiatric/Behavioral: Negative.    All other systems reviewed and are negative.  Vital signs:  BP 117/66 (BP Location: Left Arm)   Pulse 99   Temp 97.8 F (36.6 C)   Resp 18   SpO2 98%  Exam:  Physical Exam Vitals reviewed.  Constitutional:      Appearance: She is obese.  Cardiovascular:     Rate and Rhythm: Regular rhythm.     Heart sounds: Normal heart sounds.  Pulmonary:     Breath sounds: Normal breath sounds.  Abdominal:     Palpations: Abdomen is soft.     Comments: Obese abdomen with peristomal creasing.   Musculoskeletal:     Comments: Uses wheelchair in clinic today  Skin:    General: Skin is warm and dry.     Findings: Rash present.  Neurological:     Mental Status: She is alert  and oriented to person, place, and time.  Psychiatric:        Mood and Affect: Mood normal.        Behavior: Behavior normal.     Stoma type/location:  LMQ colostomy, located in crease Stomal assessment/size:  1 1/4  flush, pink and moist Peristomal assessment:  creasing that leads to umbilicus. Denuded skin has improved, but remains nonintact along bottom half.  Os points down towards 7 o'clock and directs stool towards this spot.  Photo in chart.  Treatment options for stomal/peristomal skin: create flat surface with pieces of barrier strip at 3 and 9 o'clock.  Then encircle stoma with barrier ring. Apply 1 piece convex pouch and barrier strips to promote seal.  Ostomy belt.  Output: soft brown stool Ostomy pouching: 1pc.convex with barrier ring and barrier strips, belt Education provided:  we perform another pouch change for family member to observe. She is a engineer, civil (consulting) and states that pouching is much improved now. Skin is less tender but remains nonintact and will protect with stoma powder and skin prep    Impression/dx  Contact dermatitis  Colostomy complication Discussion  Continue pouching.  Set up with [employer] for supplies.  Plan  See back 1 week to check skin breakdown.     Visit time: 55 minutes.   Darice Cooley FNP-BC

## 2023-02-19 ENCOUNTER — Other Ambulatory Visit: Payer: Self-pay | Admitting: Hematology

## 2023-02-19 DIAGNOSIS — K9400 Colostomy complication, unspecified: Secondary | ICD-10-CM | POA: Insufficient documentation

## 2023-02-19 NOTE — Discharge Instructions (Signed)
 No changes Protect skin with stoma powder and skin prep

## 2023-02-21 ENCOUNTER — Encounter: Payer: Self-pay | Admitting: General Surgery

## 2023-02-21 ENCOUNTER — Ambulatory Visit (INDEPENDENT_AMBULATORY_CARE_PROVIDER_SITE_OTHER): Payer: 59 | Admitting: General Surgery

## 2023-02-21 ENCOUNTER — Telehealth: Payer: Self-pay | Admitting: Urology

## 2023-02-21 VITALS — BP 143/83 | HR 93 | Temp 97.7°F | Resp 16 | Ht 62.0 in | Wt 295.0 lb

## 2023-02-21 DIAGNOSIS — Z433 Encounter for attention to colostomy: Secondary | ICD-10-CM

## 2023-02-21 NOTE — Patient Instructions (Addendum)
 Continue ostomy care.  Call with issues.  Can restart your decadron  and your pomalidomide  today.  Only change the ostomy when needed, do not let [facility] RN change unnecessarily.

## 2023-02-21 NOTE — Progress Notes (Signed)
 [facility]  Doing fair. Is able to get ostomy bag to stay on 3 days at one time. Going to get foley changed tomorrow. Eating more.   BP (!) 143/83   Pulse 93   Temp 97.7 F (36.5 C) (Oral)   Resp 16   Ht 5\' 2"  (1.575 m)   Wt 295 lb (133.8 kg)   SpO2 96%   BMI 53.96 kg/m  Ostomy pink with stool in bag Midline healed   Patient s/p end colostomy and ureter/bladder repair after colectomy for colovaginal fistula. Doing better.   Continue ostomy care.  Call with issues.  Can restart your decadron  and your pomalidomide  today.  Only change the ostomy when needed, do not let [employer] RN change unnecessarily.   Will see in 1 month.   Deena Farrier, MD [facility] [address] [place], [place] [address] [phone] (office)

## 2023-02-21 NOTE — Telephone Encounter (Signed)
 I spoke with patient's daughter, her catheter was placed on 12/18 during surgery and has not been changed since.  Next appt with urology is not until 02/04, daughter is aware pt was scheduled for catheter change on 1/16 at 11am and will notify patient.

## 2023-02-21 NOTE — Telephone Encounter (Signed)
 Daughter says cath has been in since dec wants to know if she can get another

## 2023-02-22 ENCOUNTER — Ambulatory Visit: Payer: 59

## 2023-02-22 NOTE — Progress Notes (Signed)
Name: Kelli Hurley DOB: 1955/12/09 MRN: [id number]  Diagnoses: Post-operative state  HPI: She presents postoperatively. She is accompanied by two family members.  She underwent the following procedures on 01/24/2023:  Dr. Henreitta Leber performed a sigmoid colectomy / end colostomy procedure for management of patient's colovaginal fistula. During the procedure patient sustained a right mid-ureteral injury below iliac vessels from the Ligasure device. As a result, Dr. Ronne Binning performed a right ureteroneocystotomy with Psoas Hitch, left ureteral resection, and placed a left urete ral stent.   Postop course: - 02/28/2023: Cystogram showed "No evidence of bladder leak or contrast extravasation."  Today: She reports the catheter is draining well.  She denies gross hematuria.  She denies significant flank pain or abdominal pain. She denies fevers, nausea, or vomiting.   Fall Screening: Do you usually have a device to assist in your mobility? Yes   Medications: Current Outpatient Medications  Medication Sig Dispense Refill   acetaminophen (TYLENOL) 650 MG CR tablet Take 650 mg by mouth every 8 (eight) hours as needed for pain.     albuterol (VENTOLIN HFA) 108 (90 Base) MCG/ACT inhaler Inhale 2 puffs into the lungs every 6 (six) hours as needed for wheezing or shortness of breath. 8 g 3   [Paused] apixaban (ELIQUIS) 5 MG TABS tablet Take 1 tablet (5 mg total) by mouth 2 (two) times daily. (Patient not taking: Reported on 02/21/2023) 60 tablet 3   [Paused] dexamethasone (DECADRON) 4 MG tablet TAKE FIVE TABLETS BY MOUTH ONCE A WEEK (Patient not taking: Reported on 02/21/2023) 30 tablet 6   docusate sodium (COLACE) 100 MG capsule Take 1 capsule (100 mg total) by mouth 2 (two) times daily. 30 capsule 0   furosemide (LASIX) 20 MG tablet Take 40 mg by mouth 2 (two) times daily.     gabapentin (NEURONTIN) 300 MG capsule Take 300 mg by mouth 3 (three) times daily.     Infant Care Products  [facility]) OINT Apply 1 Application topically daily as needed (for skin irritation).     lidocaine-prilocaine (EMLA) cream APPLY 1 QUARTER SIZED AMOUNT 1 HOUR PRIOR TO TREATMENT (Patient taking differently: Apply 1 Application topically See admin instructions. APPLY 1 QUARTER SIZED AMOUNT 1 HOUR PRIOR TO TREATMENT) 30 g 10   magnesium oxide (MAG-OX) 400 (240 Mg) MG tablet TAKE 1 TABLET BY MOUTH TWICE DAILY (Patient taking differently: Take 400 mg by mouth daily.) 60 tablet 6   methocarbamol (ROBAXIN) 500 MG tablet Take 1 tablet (500 mg total) by mouth every 8 (eight) hours as needed for muscle spasms. 30 tablet 0   metolazone (ZAROXOLYN) 2.5 MG tablet Take 1 tablet (2.5 mg total) by mouth 3 (three) times a week. Mon., Wed., and Friday 30 tablet 4   mirabegron ER (MYRBETRIQ) 25 MG TB24 tablet Take 1 tablet (25 mg total) by mouth daily. 30 tablet 0   Multiple Vitamin (TAB-A-VITE) TABS TAKE 1 TABLET BY MOUTH ONCE DAILY. (Patient taking differently: Take 1 tablet by mouth daily.) 30 tablet 11   ondansetron (ZOFRAN-ODT) 4 MG disintegrating tablet Take 1 tablet (4 mg total) by mouth every 6 (six) hours as needed for nausea. 20 tablet 0   oxyCODONE (OXY IR/ROXICODONE) 5 MG immediate release tablet Take 1 tablet (5 mg total) by mouth every 4 (four) hours as needed for severe pain (pain score 7-10) or breakthrough pain. 20 tablet 0   oxyCODONE-acetaminophen (PERCOCET/ROXICET) 5-325 MG tablet Take 1 tablet by mouth every 12 (twelve) hours as needed for severe pain (pain  score 7-10). TAKE 1 OR 2 TABLETS BY MOUTH EVERY TWELVE HOURS AS NEEDED for severe pain 60 tablet 0   pomalidomide (POMALYST) 4 MG capsule Take 1 capsule (4 mg total) by mouth daily. 21 days on, 7 days off 21 capsule 0   potassium chloride SA (KLOR-CON M) 20 MEQ tablet TAKE 1 TABLET BY MOUTH THREE TIMES DAILY (Patient taking differently: Take 40 mEq by mouth daily.) 90 tablet 10   pregabalin (LYRICA) 75 MG capsule TAKE ONE CAPSULE BY MOUTH TWICE  DAILY 60 capsule 3   rosuvastatin (CRESTOR) 10 MG tablet TAKE ONE TABLET BY MOUTH ONCE DAILY 90 tablet 3   Semaglutide, 2 MG/DOSE, (OZEMPIC, 2 MG/DOSE,) 8 MG/3ML SOPN Inject 2 mg into the skin once a week. (Patient taking differently: Inject 2 mg into the skin every Tuesday.) 3 mL 11   trolamine salicylate (ASPERCREME) 10 % cream Apply 1 application topically 2 (two) times daily as needed for muscle pain.     VITAMIN D PO Take by mouth.     No current facility-administered medications for this visit.   Facility-Administered Medications Ordered in Other Visits  Medication Dose Route Frequency Provider Last Rate Last Admin   heparin lock flush 100 unit/mL  500 Units Intravenous Once Penland, Novella Olive, MD       sodium chloride 0.9 % injection 10 mL  10 mL Intravenous Once Penland, Novella Olive, MD        Allergies: Allergies  Allergen Reactions   Diclofenac Swelling    Per pt, facial swelling  Other Reaction(s): Other (See Comments)  Per pt, facial swelling, Per pt, facial swelling    Past Medical History:  Diagnosis Date   Anemia    Arthritis    Asthma    Cancer (HCC)    Claustrophobia 10/05/2014   Diabetes mellitus without complication (HCC)    Dyspnea    Hypertension    Leukopenia 08/03/2014   Normocytic hypochromic anemia 08/03/2014   Renal disorder    stage 3    Sleep apnea    Past Surgical History:  Procedure Laterality Date   ABDOMINAL HYSTERECTOMY     CARDIAC SURGERY     35 months old. States she had a leaky valve.    COLECTOMY WITH COLOSTOMY CREATION/HARTMANN PROCEDURE N/A 01/24/2023   Procedure: EXPLORATORY LAPAROTOMY;  Surgeon: Lucretia Roers, MD;  Location: [facility];  Service: General;  Laterality: N/A;   COLON RESECTION SIGMOID  01/24/2023   Procedure: SIGMOID COLECTOMY WITH END COLOSTOMY;  Surgeon: Lucretia Roers, MD;  Location: [facility];  Service: General;;   COLONOSCOPY  03/26/2009   Dr. Darrick Penna; normal colon, small internal hemorrhoids.  Recommended  repeat colonoscopy in 10 years.   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"   PORTACATH PLACEMENT Left 02/21/2019   Procedure: INSERTION PORT-A-CATH;  Surgeon: Lucretia Roers, MD;  Location: [facility];  Service: General;  Laterality: Left;   Family History  Problem Relation Age of Onset   Cancer Mother    Hypertension Mother    Cancer Father    Hypertension Father    Hypertension Sister    Hypertension  Sister    Hypertension Sister    Hypertension Sister    Cancer Maternal Grandmother    Cancer Cousin    Hypertension Brother    Prostate cancer Brother    Hypertension Brother    Hypertension Brother    Colon cancer Neg Hx    Colon polyps Neg Hx    Social History   Socioeconomic History   Marital status: Single    Spouse name: Not on file   Number of children: Not on file   Years of education: Not on file   Highest education level: Not on file  Occupational History   Not on file  Tobacco Use   Smoking status: Never   Smokeless tobacco: Never  Vaping Use   Vaping status: Never Used  Substance and Sexual Activity   Alcohol use: No   Drug use: No   Sexual activity: Not Currently    Birth control/protection: Surgical    Comment: divorced- 2 daughters; hyst  Other Topics Concern   Not on file  Social History Narrative   Not on file   Social Drivers of Health   Financial Resource Strain: Medium Risk (08/16/2022)   Overall Financial Resource Strain (CARDIA)    Difficulty of Paying Living Expenses: Somewhat hard  Food Insecurity: No Food Insecurity (01/24/2023)   Hunger Vital Sign    Worried About Running Out of Food in the Last Year: Never  true    Ran Out of Food in the Last Year: Never true  Transportation Needs: No Transportation Needs (01/24/2023)   PRAPARE - Administrator, Civil Service (Medical): No    Lack of Transportation (Non-Medical): No  Physical Activity: Insufficiently Active (08/16/2022)   Exercise Vital Sign    Days of Exercise per Week: 1 day    Minutes of Exercise per Session: 10 min  Stress: No Stress Concern Present (08/16/2022)   [employer] of Occupational Health - Occupational Stress Questionnaire    Feeling of Stress : Not at all  Social Connections: Moderately Isolated (08/16/2022)   Social Connection and Isolation Panel [NHANES]    Frequency of Communication with Friends and Family: More than three times a week    Frequency of Social Gatherings with Friends and Family: Once a week    Attends Religious Services: More than 4 times per year    Active Member of [employer] or Organizations: No    Attends Banker Meetings: Never    Marital Status: Divorced  Catering manager Violence: Not At Risk (01/24/2023)   Humiliation, Afraid, Rape, and Kick questionnaire    Fear of Current or Ex-Partner: No    Emotionally Abused: No    Physically Abused: No    Sexually Abused: No    SUBJECTIVE  Review of Systems Constitutional: Patient denies any unintentional weight loss or change in strength lntegumentary: Patient denies any rashes or pruritus Cardiovascular: Patient denies chest pain or syncope Respiratory: Patient denies shortness of breath Gastrointestinal: Patient denies nausea, vomiting, constipation, or diarrhea Musculoskeletal: Patient denies muscle cramps or weakness Neurologic: Patient denies convulsions or seizures Allergic/Immunologic: Patient denies recent allergic reaction(s) Hematologic/Lymphatic: Patient denies bleeding tendencies Endocrine: Patient denies heat/cold intolerance  GU: As per HPI.  OBJECTIVE Vitals:   03/02/23 1256  BP: 127/81  Pulse: (!) 101   Temp: 97.8 F (36.6 C)   There is no height or weight on file to calculate BMI.  Physical Examination Constitutional: No obvious distress; patient is non-toxic appearing  Cardiovascular: No visible lower  extremity edema.  Respiratory: The patient does not have audible wheezing/stridor; respirations do not appear labored  Gastrointestinal: Abdomen non-distended Musculoskeletal: Normal ROM of UEs  Skin: No obvious rashes/open sores  Neurologic: CN 2-12 grossly intact Psychiatric: Answered questions appropriately with normal affect  Hematologic/Lymphatic/Immunologic: No obvious bruises or sites of spontaneous bleeding  GU: Catheter draining clear yellow urine.  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 Foley catheter removed; no voiding trial required as per Dr. Ronne Binning.   Will plan for follow up with Dr. Retta Diones for stent removal as previously scheduled on 03/13/2023. Pt verbalized understanding and agreement. All questions were answered.  PLAN Advised the following: Foley catheter discontinued. Return in 11 days (on 03/13/2023) for f/u with Dr. Retta Diones, as previously scheduled.   No orders of the defined types were placed in this encounter.   It has been explained that the patient is to follow regularly with their PCP in addition to all other providers involved in their care and to follow instructions provided by these respective offices. Patient advised to contact urology clinic if any urologic-pertaining questions, concerns, new symptoms or problems arise in the interim period.  There are no Patient Instructions on file for this visit.  Electronically signed by:  Donnita Falls, MSN, FNP-C, CUNP 03/02/2023 2:08 PM

## 2023-02-23 ENCOUNTER — Other Ambulatory Visit: Payer: Self-pay

## 2023-02-23 ENCOUNTER — Telehealth: Payer: Self-pay | Admitting: Urology

## 2023-02-23 MED ORDER — POMALIDOMIDE 4 MG PO CAPS: 4.0000 mg | ORAL_CAPSULE | Freq: Every day | ORAL | 0 refills | Status: DC

## 2023-02-23 NOTE — Telephone Encounter (Signed)
Is it just pink-tinged or pure blood / gross hematuria? Any clots? Flowing OK or concern for clogging?  Return call to patient. Patient states her urine is dark brown, patient is advised to increase fluids. Patient states she is taking Lasix 20 mg BID. Per Maralyn Sago increasing fluids will make patient voided more and the lasix will help thin the gross hematuria. Patient voiced understanding.

## 2023-02-23 NOTE — Telephone Encounter (Signed)
Blood in cath bag no pain has appt 1/24

## 2023-02-23 NOTE — Telephone Encounter (Signed)
Chart reviewed. Pomalyst refilled per last office note with Dr. Katragadda.  

## 2023-02-26 ENCOUNTER — Other Ambulatory Visit: Payer: Self-pay | Admitting: General Surgery

## 2023-02-26 ENCOUNTER — Ambulatory Visit (HOSPITAL_COMMUNITY)
Admission: RE | Admit: 2023-02-26 | Discharge: 2023-02-26 | Disposition: A | Discharge status: Home / Self Care | Payer: 59 | Source: Ambulatory Visit | Attending: Family Medicine | Admitting: Family Medicine

## 2023-02-26 ENCOUNTER — Other Ambulatory Visit: Payer: Self-pay

## 2023-02-26 DIAGNOSIS — L249 Irritant contact dermatitis, unspecified cause: Secondary | ICD-10-CM | POA: Diagnosis not present

## 2023-02-26 DIAGNOSIS — G8929 Other chronic pain: Secondary | ICD-10-CM

## 2023-02-26 DIAGNOSIS — L24B3 Irritant contact dermatitis related to fecal or urinary stoma or fistula: Secondary | ICD-10-CM | POA: Diagnosis not present

## 2023-02-26 DIAGNOSIS — Z433 Encounter for attention to colostomy: Secondary | ICD-10-CM | POA: Diagnosis present

## 2023-02-26 MED ORDER — OXYCODONE-ACETAMINOPHEN 5-325 MG PO TABS: 1.0000 | ORAL_TABLET | Freq: Two times a day (BID) | ORAL | 0 refills | Status: DC | PRN

## 2023-02-26 NOTE — Progress Notes (Signed)
[facility]   Reason for visit:  LMQ colostomy with peristomal Breakdown to peristomal skin.   HPI:  Sigmoid colectomy with end colostomy Past Medical History:  Diagnosis Date  . Anemia   . Arthritis   . Asthma   . Cancer (HCC)   . Claustrophobia 10/05/2014  . Diabetes mellitus without complication (HCC)   . Dyspnea   . Hypertension   . Leukopenia 08/03/2014  . Normocytic hypochromic anemia 08/03/2014  . Renal disorder    stage 3   . Sleep apnea    Family History  Problem Relation Age of Onset  . Cancer Mother   . Hypertension Mother   . Cancer Father   . Hypertension Father   . Hypertension Sister   . Hypertension Sister   . Hypertension Sister   . Hypertension Sister   . Cancer Maternal Grandmother   . Cancer Cousin   . Hypertension Brother   . Prostate cancer Brother   . Hypertension Brother   . Hypertension Brother   . Colon cancer Neg Hx   . Colon polyps Neg Hx    Allergies  Allergen Reactions  . Diclofenac Swelling    Per pt, facial swelling  Other Reaction(s): Other (See Comments)  Per pt, facial swelling, Per pt, facial swelling   Current Outpatient Medications  Medication Sig Dispense Refill Last Dose/Taking  . acetaminophen (TYLENOL) 650 MG CR tablet Take 650 mg by mouth every 8 (eight) hours as needed for pain.     Marland Kitchen albuterol (VENTOLIN HFA) 108 (90 Base) MCG/ACT inhaler Inhale 2 puffs into the lungs every 6 (six) hours as needed for wheezing or shortness of breath. 8 g 3   . [Paused] apixaban (ELIQUIS) 5 MG TABS tablet Take 1 tablet (5 mg total) by mouth 2 (two) times daily. (Patient not taking: Reported on 02/21/2023) 60 tablet 3   . [Paused] dexamethasone (DECADRON) 4 MG tablet TAKE FIVE TABLETS BY MOUTH ONCE A WEEK (Patient not taking: Reported on 02/21/2023) 30 tablet 6   . docusate sodium (COLACE) 100 MG capsule Take 1 capsule (100 mg total) by mouth 2 (two) times daily. 30 capsule 0   . furosemide (LASIX) 20 MG tablet Take 40 mg  by mouth 2 (two) times daily.     Marland Kitchen gabapentin (NEURONTIN) 300 MG capsule Take 300 mg by mouth 3 (three) times daily.     . Infant Care Products (DERMACLOUD) OINT Apply 1 Application topically daily as needed (for skin irritation).     Marland Kitchen lidocaine-prilocaine (EMLA) cream APPLY 1 QUARTER SIZED AMOUNT 1 HOUR PRIOR TO TREATMENT (Patient taking differently: Apply 1 Application topically See admin instructions. APPLY 1 QUARTER SIZED AMOUNT 1 HOUR PRIOR TO TREATMENT) 30 g 10   . magnesium oxide (MAG-OX) 400 (240 Mg) MG tablet TAKE 1 TABLET BY MOUTH TWICE DAILY (Patient taking differently: Take 400 mg by mouth daily.) 60 tablet 6   . methocarbamol (ROBAXIN) 500 MG tablet Take 1 tablet (500 mg total) by mouth every 8 (eight) hours as needed for muscle spasms. 30 tablet 0   . metolazone (ZAROXOLYN) 2.5 MG tablet Take 1 tablet (2.5 mg total) by mouth 3 (three) times a week. Mon., Wed., and Friday 30 tablet 4   . mirabegron ER (MYRBETRIQ) 25 MG TB24 tablet Take 1 tablet (25 mg total) by mouth daily. 30 tablet 0   . Multiple Vitamin (TAB-A-VITE) TABS TAKE 1 TABLET BY MOUTH ONCE DAILY. (Patient taking differently: Take 1 tablet by mouth daily.)  30 tablet 11   . ondansetron (ZOFRAN-ODT) 4 MG disintegrating tablet Take 1 tablet (4 mg total) by mouth every 6 (six) hours as needed for nausea. 20 tablet 0   . oxyCODONE (OXY IR/ROXICODONE) 5 MG immediate release tablet Take 1 tablet (5 mg total) by mouth every 4 (four) hours as needed for severe pain (pain score 7-10) or breakthrough pain. 20 tablet 0   . oxyCODONE-acetaminophen (PERCOCET/ROXICET) 5-325 MG tablet Take 1 tablet by mouth every 12 (twelve) hours as needed for severe pain (pain score 7-10). TAKE 1 OR 2 TABLETS BY MOUTH EVERY TWELVE HOURS AS NEEDED for severe pain 60 tablet 0   . pomalidomide (POMALYST) 4 MG capsule Take 1 capsule (4 mg total) by mouth daily. 21 days on, 7 days off 21 capsule 0   . potassium chloride SA (KLOR-CON M) 20 MEQ tablet TAKE 1 TABLET  BY MOUTH THREE TIMES DAILY (Patient taking differently: Take 40 mEq by mouth daily.) 90 tablet 10   . pregabalin (LYRICA) 75 MG capsule TAKE ONE CAPSULE BY MOUTH TWICE DAILY 60 capsule 3   . rosuvastatin (CRESTOR) 10 MG tablet TAKE ONE TABLET BY MOUTH ONCE DAILY 90 tablet 3   . Semaglutide, 2 MG/DOSE, (OZEMPIC, 2 MG/DOSE,) 8 MG/3ML SOPN Inject 2 mg into the skin once a week. (Patient taking differently: Inject 2 mg into the skin every Tuesday.) 3 mL 11   . trolamine salicylate (ASPERCREME) 10 % cream Apply 1 application topically 2 (two) times daily as needed for muscle pain.     Marland Kitchen VITAMIN D PO Take by mouth.      No current facility-administered medications for this encounter.   Facility-Administered Medications Ordered in Other Encounters  Medication Dose Route Frequency Provider Last Rate Last Admin  . heparin lock flush 100 unit/mL  500 Units Intravenous Once Penland, Carollee Herter K, MD      . sodium chloride 0.9 % injection 10 mL  10 mL Intravenous Once Penland, Novella Olive, MD       ROS  Review of Systems  Constitutional:  Positive for fatigue.       Uses wheelchair  Cardiovascular:  Positive for leg swelling.  Gastrointestinal:        LMQ colostomy  Genitourinary:        Colovaginal fistula repair.  Ureter/bladder repair  Foley in place  Musculoskeletal:  Positive for gait problem.       Uses wheelchair  Skin:  Positive for rash and wound.       Erythema and breakdown around stoma.  New area open, original breakdown resolved.   Psychiatric/Behavioral: Negative.    All other systems reviewed and are negative.  Vital signs:  BP 125/84 (BP Location: Left Arm)   Pulse 85   Temp 98 F (36.7 C) (Oral)   Resp 20  Exam:  Physical Exam Vitals reviewed.  Constitutional:      Appearance: She is obese.  Abdominal:     Palpations: Abdomen is soft.  Genitourinary:    Comments: Recent bladder repair, fistula repair Skin:    General: Skin is warm and dry.     Findings: Erythema and  rash present.  Neurological:     Mental Status: She is alert and oriented to person, place, and time. Mental status is at baseline.  Psychiatric:        Behavior: Behavior normal.    Stoma type/location:  LMQ colostomy Stomal assessment/size:  1" flush pink and moist, os opens at 8 o'clock Peristomal  assessment:   skin breakdown to peristomal skin.  Has healed at distal end and new breakdown from 7 to 10 o'clock.  Treatment options for stomal/peristomal skin: stoma powder and skin prep.  Piece of barrier ring to creasing and around stoma Output: thick brown stool Ostomy pouching: 1pc. Convex with stoma powder and skin prep and barrier ring  Education provided:  We perform pouch change and discuss protecting skin.      Impression/dx  Irritant contact dermatitis colostomy Discussion  Pouch change performed  skin protected  Plan  See back 2 weeks to assess peristomal skin    Visit time: 55 minutes.   Mike Gip FNP-BC

## 2023-02-28 ENCOUNTER — Ambulatory Visit (HOSPITAL_COMMUNITY)
Admission: RE | Admit: 2023-02-28 | Discharge: 2023-02-28 | Disposition: A | Discharge status: Home / Self Care | Payer: 59 | Source: Ambulatory Visit | Attending: Urology | Admitting: Urology

## 2023-02-28 ENCOUNTER — Encounter (HOSPITAL_COMMUNITY): Payer: Self-pay

## 2023-02-28 DIAGNOSIS — S3710XA Unspecified injury of ureter, initial encounter: Secondary | ICD-10-CM

## 2023-02-28 DIAGNOSIS — Z933 Colostomy status: Secondary | ICD-10-CM | POA: Diagnosis not present

## 2023-02-28 DIAGNOSIS — Z48816 Encounter for surgical aftercare following surgery on the genitourinary system: Secondary | ICD-10-CM | POA: Insufficient documentation

## 2023-02-28 DIAGNOSIS — Z960 Presence of urogenital implants: Secondary | ICD-10-CM | POA: Insufficient documentation

## 2023-02-28 MED ORDER — IOTHALAMATE MEGLUMINE 17.2 % UR SOLN
250.0000 mL | Freq: Once | URETHRAL | Status: AC | PRN
  Administered 2023-02-28: 250 mL via INTRAVESICAL

## 2023-03-02 ENCOUNTER — Ambulatory Visit (INDEPENDENT_AMBULATORY_CARE_PROVIDER_SITE_OTHER): Payer: 59 | Admitting: Urology

## 2023-03-02 ENCOUNTER — Encounter: Payer: Self-pay | Admitting: Internal Medicine

## 2023-03-02 ENCOUNTER — Encounter: Payer: Self-pay | Admitting: Urology

## 2023-03-02 VITALS — BP 127/81 | HR 101 | Temp 97.8°F

## 2023-03-02 DIAGNOSIS — N131 Hydronephrosis with ureteral stricture, not elsewhere classified: Secondary | ICD-10-CM

## 2023-03-02 DIAGNOSIS — Z09 Encounter for follow-up examination after completed treatment for conditions other than malignant neoplasm: Secondary | ICD-10-CM

## 2023-03-02 DIAGNOSIS — S3710XD Unspecified injury of ureter, subsequent encounter: Secondary | ICD-10-CM

## 2023-03-02 DIAGNOSIS — N824 Other female intestinal-genital tract fistulae: Secondary | ICD-10-CM

## 2023-03-05 ENCOUNTER — Other Ambulatory Visit: Payer: Self-pay | Admitting: *Deleted

## 2023-03-05 MED ORDER — APIXABAN 5 MG PO TABS: 5.0000 mg | ORAL_TABLET | Freq: Two times a day (BID) | ORAL | 5 refills | Status: DC

## 2023-03-05 NOTE — Telephone Encounter (Signed)
Prescription refill request for Eliquis received. Indication: A Flutter Last office visit: 09/26/22  Lionel December NP Scr: 2.29 on 02/08/23  Epic Age: 68 Weight: 127.8kg  Based on above findings Eliquis 5mg  twice daily is the appropriate dose.  Refill approved.

## 2023-03-06 ENCOUNTER — Ambulatory Visit (HOSPITAL_COMMUNITY)
Admission: RE | Admit: 2023-03-06 | Discharge: 2023-03-06 | Disposition: A | Discharge status: Home / Self Care | Payer: 59 | Source: Ambulatory Visit | Attending: Plastic Surgery | Admitting: Plastic Surgery

## 2023-03-06 DIAGNOSIS — K9400 Colostomy complication, unspecified: Secondary | ICD-10-CM | POA: Diagnosis not present

## 2023-03-06 DIAGNOSIS — Z433 Encounter for attention to colostomy: Secondary | ICD-10-CM | POA: Insufficient documentation

## 2023-03-06 DIAGNOSIS — L24B3 Irritant contact dermatitis related to fecal or urinary stoma or fistula: Secondary | ICD-10-CM | POA: Diagnosis not present

## 2023-03-06 NOTE — Progress Notes (Signed)
 History of Present Illness: Kelli Hurley is a 68 y.o. year old female who comes in today for right ureteral stent extraction.  She underwent sigmoid colectomy with an end colostomy on 12.18.2024 for history of diverticulitis with colovesical fistula.  There was an unintended right ureteral injury during that procedure.  Right ureteroneocystostomy and psoas hitch were performed by Dr. Sherrilee.    Past Medical History:  Diagnosis Date   Anemia    Arthritis    Asthma    Cancer (HCC)    Claustrophobia 10/05/2014   Diabetes mellitus without complication (HCC)    Dyspnea    Hypertension    Leukopenia 08/03/2014   Normocytic hypochromic anemia 08/03/2014   Renal disorder    stage 3    Sleep apnea     Past Surgical History:  Procedure Laterality Date   ABDOMINAL HYSTERECTOMY     CARDIAC SURGERY     40 months old. States she had a leaky valve.    COLECTOMY WITH COLOSTOMY CREATION/HARTMANN PROCEDURE N/A 01/24/2023   Procedure: EXPLORATORY LAPAROTOMY;  Surgeon: Kallie Manuelita JAYSON, MD;  Location: [facility];  Service: General;  Laterality: N/A;   COLON RESECTION SIGMOID  01/24/2023   Procedure: SIGMOID COLECTOMY WITH END COLOSTOMY;  Surgeon: Kallie Manuelita JAYSON, MD;  Location: [facility];  Service: General;;   COLONOSCOPY  03/26/2009   Dr. Harvey; normal colon, small internal hemorrhoids.  Recommended repeat colonoscopy in 10 years.   COLONOSCOPY WITH PROPOFOL  N/A 11/25/2019   Procedure: COLONOSCOPY WITH PROPOFOL ;  Surgeon: Cindie Carlin POUR, DO;  Location: [facility];  Service: Endoscopy;  Laterality: N/A;  10:45am   IR PATIENT EVAL TECH 0-60 MINS  09/08/2022   IR URETERAL STENT PLACEMENT EXISTING ACCESS LEFT  11/24/2022   OTHER SURGICAL HISTORY     heart surgery as infant to repair hole in heart   PORTACATH PLACEMENT Left 02/21/2019   Procedure: INSERTION PORT-A-CATH;  Surgeon: Kallie Manuelita JAYSON, MD;  Location: [facility];  Service: General;  Laterality: Left;    Home Medications:   (Not in a hospital admission)   Allergies:  Allergies  Allergen Reactions   Diclofenac  Swelling    Per pt, facial swelling  Other Reaction(s): Other (See Comments)  Per pt, facial swelling, Per pt, facial swelling    Family History  Problem Relation Age of Onset   Cancer Mother    Hypertension Mother    Cancer Father    Hypertension Father    Hypertension Sister    Hypertension Sister    Hypertension Sister    Hypertension Sister    Cancer Maternal Grandmother    Cancer Cousin    Hypertension Brother    Prostate cancer Brother    Hypertension Brother    Hypertension Brother    Colon cancer Neg Hx    Colon polyps Neg Hx     Social History:  reports that she has never smoked. She has never used smokeless tobacco. She reports that she does not drink alcohol and does not use drugs.  ROS: A complete review of systems was performed.  All systems are negative except for pertinent findings as noted.  Physical Exam:  Vital signs in last 24 hours: @VSRANGES @ General:  Alert and oriented, No acute distress HEENT: Normocephalic, atraumatic Neck: No JVD or lymphadenopathy Cardiovascular: Regular rate  Lungs: Normal inspiratory/expiratory excursion Neurologic: Grossly intact  I have reviewed prior pt op notes  I have reviewed urinalysis results--appears in infected  I have independently reviewed prior  imaging  I have reviewed prior urine culture    Impression/Assessment:  History of colovesical fistula, status post colectomy/cystorrhaphy.  Unintended right ureteral injury necessitating right ureteral reimplantation with psoas hitch.  It appears she has a urine infection a day which precludes stent extraction  Plan:  Urine was sent for culture  Antibiotics administered  I will see her back in 2 weeks for cystoscopy and stent extraction  Kelli Hurley 03/06/2023, 2:01 PM  Kelli HERO. Kelli Zwiefelhofer MD

## 2023-03-06 NOTE — Discharge Instructions (Signed)
Email and let me know how it is working [email or web address]  Switched to coloplast flexible convex Use powder and skin prep  Barrier strip in crease to belly button Barrier ring around stoma Added belt, will add larger size if we switch to this one.

## 2023-03-06 NOTE — Progress Notes (Signed)
[facility]   Reason for visit:  LLQ colostomy, states pouch is leaking multiple times daily.  Peristomal skin has improved.  See photo in chart HPI:  Colovaginal fistula with end colostomy Past Medical History:  Diagnosis Date   Anemia    Arthritis    Asthma    Cancer (HCC)    Claustrophobia 10/05/2014   Diabetes mellitus without complication (HCC)    Dyspnea    Hypertension    Leukopenia 08/03/2014   Normocytic hypochromic anemia 08/03/2014   Renal disorder    stage 3    Sleep apnea    Family History  Problem Relation Age of Onset   Cancer Mother    Hypertension Mother    Cancer Father    Hypertension Father    Hypertension Sister    Hypertension Sister    Hypertension Sister    Hypertension Sister    Cancer Maternal Grandmother    Cancer Cousin    Hypertension Brother    Prostate cancer Brother    Hypertension Brother    Hypertension Brother    Colon cancer Neg Hx    Colon polyps Neg Hx    Allergies  Allergen Reactions   Diclofenac Swelling    Per pt, facial swelling  Other Reaction(s): Other (See Comments)  Per pt, facial swelling, Per pt, facial swelling   Current Outpatient Medications  Medication Sig Dispense Refill Last Dose/Taking   acetaminophen (TYLENOL) 650 MG CR tablet Take 650 mg by mouth every 8 (eight) hours as needed for pain.      albuterol (VENTOLIN HFA) 108 (90 Base) MCG/ACT inhaler Inhale 2 puffs into the lungs every 6 (six) hours as needed for wheezing or shortness of breath. 8 g 3    apixaban (ELIQUIS) 5 MG TABS tablet Take 1 tablet (5 mg total) by mouth 2 (two) times daily. 60 tablet 5    [Paused] dexamethasone (DECADRON) 4 MG tablet TAKE FIVE TABLETS BY MOUTH ONCE A WEEK (Patient not taking: Reported on 02/21/2023) 30 tablet 6    docusate sodium (COLACE) 100 MG capsule Take 1 capsule (100 mg total) by mouth 2 (two) times daily. 30 capsule 0    furosemide (LASIX) 20 MG tablet Take 40 mg by mouth 2 (two) times daily.       gabapentin (NEURONTIN) 300 MG capsule Take 300 mg by mouth 3 (three) times daily.      Infant Care Products [facility]) OINT Apply 1 Application topically daily as needed (for skin irritation).      lidocaine-prilocaine (EMLA) cream APPLY 1 QUARTER SIZED AMOUNT 1 HOUR PRIOR TO TREATMENT (Patient taking differently: Apply 1 Application topically See admin instructions. APPLY 1 QUARTER SIZED AMOUNT 1 HOUR PRIOR TO TREATMENT) 30 g 10    magnesium oxide (MAG-OX) 400 (240 Mg) MG tablet TAKE 1 TABLET BY MOUTH TWICE DAILY (Patient taking differently: Take 400 mg by mouth daily.) 60 tablet 6    methocarbamol (ROBAXIN) 500 MG tablet Take 1 tablet (500 mg total) by mouth every 8 (eight) hours as needed for muscle spasms. 30 tablet 0    metolazone (ZAROXOLYN) 2.5 MG tablet Take 1 tablet (2.5 mg total) by mouth 3 (three) times a week. Mon., Wed., and Friday 30 tablet 4    mirabegron ER (MYRBETRIQ) 25 MG TB24 tablet Take 1 tablet (25 mg total) by mouth daily. 30 tablet 0    Multiple Vitamin (TAB-A-VITE) TABS TAKE 1 TABLET BY MOUTH ONCE DAILY. (Patient taking differently: Take 1 tablet by  mouth daily.) 30 tablet 11    ondansetron (ZOFRAN-ODT) 4 MG disintegrating tablet Take 1 tablet (4 mg total) by mouth every 6 (six) hours as needed for nausea. 20 tablet 0    oxyCODONE (OXY IR/ROXICODONE) 5 MG immediate release tablet Take 1 tablet (5 mg total) by mouth every 4 (four) hours as needed for severe pain (pain score 7-10) or breakthrough pain. 20 tablet 0    oxyCODONE-acetaminophen (PERCOCET/ROXICET) 5-325 MG tablet Take 1 tablet by mouth every 12 (twelve) hours as needed for severe pain (pain score 7-10). TAKE 1 OR 2 TABLETS BY MOUTH EVERY TWELVE HOURS AS NEEDED for severe pain 60 tablet 0    pomalidomide (POMALYST) 4 MG capsule Take 1 capsule (4 mg total) by mouth daily. 21 days on, 7 days off 21 capsule 0    potassium chloride SA (KLOR-CON M) 20 MEQ tablet TAKE 1 TABLET BY MOUTH THREE TIMES DAILY (Patient taking  differently: Take 40 mEq by mouth daily.) 90 tablet 10    pregabalin (LYRICA) 75 MG capsule TAKE ONE CAPSULE BY MOUTH TWICE DAILY 60 capsule 3    rosuvastatin (CRESTOR) 10 MG tablet TAKE ONE TABLET BY MOUTH ONCE DAILY 90 tablet 3    Semaglutide, 2 MG/DOSE, (OZEMPIC, 2 MG/DOSE,) 8 MG/3ML SOPN Inject 2 mg into the skin once a week. (Patient taking differently: Inject 2 mg into the skin every Tuesday.) 3 mL 11    trolamine salicylate (ASPERCREME) 10 % cream Apply 1 application topically 2 (two) times daily as needed for muscle pain.      VITAMIN D PO Take by mouth.      No current facility-administered medications for this encounter.   Facility-Administered Medications Ordered in Other Encounters  Medication Dose Route Frequency Provider Last Rate Last Admin   heparin lock flush 100 unit/mL  500 Units Intravenous Once Penland, Novella Olive, MD       sodium chloride 0.9 % injection 10 mL  10 mL Intravenous Once Penland, Novella Olive, MD       ROS  Review of Systems  Constitutional:  Positive for fatigue.  Gastrointestinal:        LLQ colostomy  Genitourinary:        Repair colovaginal fistula- foley in place  Musculoskeletal:  Positive for gait problem.  All other systems reviewed and are negative.  Vital signs:  BP 119/64 (BP Location: Left Arm)   Pulse 90   Temp 98 F (36.7 C) (Oral)   Resp 18   SpO2 95%  Exam:  Physical Exam Vitals reviewed.  Constitutional:      Appearance: She is obese.  HENT:     Mouth/Throat:     Mouth: Mucous membranes are moist.  Cardiovascular:     Pulses: Normal pulses.  Pulmonary:     Effort: Pulmonary effort is normal.  Abdominal:     Palpations: Abdomen is soft.  Skin:    General: Skin is warm and dry.     Findings: Rash present.  Neurological:     Mental Status: She is alert.     Stoma type/location:  LLQ colostomy Stomal assessment/size:  1 3/8" pink and moist  stoma located in a valley circumferentially.  Deep creasing to umbilicus at 9  o'clock.  Must fill in this defect to avoid pouch popping off Peristomal assessment:  stoma powder and skin prep  Barrier strip to creasing  barrier ring around stoma  Switching to flexible convex coloplast 2 piece pouch   Barrier strips and  ostomy belt placed.  Treatment options for stomal/peristomal skin: stoma powder skin prep  fill in creasing with barrier strip and barrier ring Output: soft brown stool Ostomy pouching: 2pc. Flexible convex coloplast pouch,  Tapeless backing will be less irritating to patient skin Education provided:  Performed pouch change  Demonstrated new pouch, applied barrier strips and belt.  Would benefit from XXL belt.  Will update [facility]    Impression/dx  Colostomy Irritant contact dermatitis Discussion  New pouch with tapeless backing barrier ring to creasing and around stoma.  Daughter videos pouch change to show other family members/caregivers Encourage patient to begin practicing emptying pouch.  She uses bedside commode at home.  I demonstrate sitting on bedside commode and emptying.  She agrees to try.  Plan  See back 2 weeks.     Visit time: 55 minutes.   Mike Gip FNP-BC

## 2023-03-08 ENCOUNTER — Encounter (HOSPITAL_COMMUNITY): Payer: Self-pay | Admitting: Hematology

## 2023-03-12 ENCOUNTER — Ambulatory Visit (HOSPITAL_COMMUNITY): Payer: 59 | Admitting: Nurse Practitioner

## 2023-03-13 ENCOUNTER — Encounter: Payer: Self-pay | Admitting: Urology

## 2023-03-13 ENCOUNTER — Telehealth: Payer: Self-pay | Admitting: Hematology

## 2023-03-13 ENCOUNTER — Ambulatory Visit (HOSPITAL_BASED_OUTPATIENT_CLINIC_OR_DEPARTMENT_OTHER): Payer: 59 | Admitting: Pulmonary Disease

## 2023-03-13 ENCOUNTER — Ambulatory Visit: Payer: 59 | Admitting: Urology

## 2023-03-13 VITALS — BP 129/81 | HR 101

## 2023-03-13 DIAGNOSIS — N3000 Acute cystitis without hematuria: Secondary | ICD-10-CM

## 2023-03-13 DIAGNOSIS — S3710XD Unspecified injury of ureter, subsequent encounter: Secondary | ICD-10-CM

## 2023-03-13 DIAGNOSIS — N824 Other female intestinal-genital tract fistulae: Secondary | ICD-10-CM

## 2023-03-13 DIAGNOSIS — N131 Hydronephrosis with ureteral stricture, not elsewhere classified: Secondary | ICD-10-CM

## 2023-03-13 DIAGNOSIS — R82998 Other abnormal findings in urine: Secondary | ICD-10-CM

## 2023-03-13 LAB — URINALYSIS, ROUTINE W REFLEX MICROSCOPIC
Bilirubin, UA: NEGATIVE
Glucose, UA: NEGATIVE
Ketones, UA: NEGATIVE
Nitrite, UA: POSITIVE — AB
Specific Gravity, UA: 1.02 (ref 1.005–1.030)
Urobilinogen, Ur: 0.2 mg/dL (ref 0.2–1.0)
pH, UA: 6 (ref 5.0–7.5)

## 2023-03-13 LAB — MICROSCOPIC EXAMINATION: WBC, UA: >30 /[HPF] — AB (ref 0–5)

## 2023-03-13 MED ORDER — CEPHALEXIN 500 MG PO CAPS: 500.0000 mg | ORAL_CAPSULE | Freq: Three times a day (TID) | ORAL | 0 refills | Status: DC

## 2023-03-13 NOTE — Telephone Encounter (Signed)
At the request of Kristen Fromm pts daughter and main contact, I printed off 2 yrs of bills to submit to DSS to determine if pt is eligible for medicaid.

## 2023-03-14 ENCOUNTER — Inpatient Hospital Stay: Payer: 59 | Attending: Hematology

## 2023-03-14 DIAGNOSIS — C9000 Multiple myeloma not having achieved remission: Secondary | ICD-10-CM

## 2023-03-14 DIAGNOSIS — C9002 Multiple myeloma in relapse: Secondary | ICD-10-CM | POA: Diagnosis present

## 2023-03-14 DIAGNOSIS — D508 Other iron deficiency anemias: Secondary | ICD-10-CM

## 2023-03-14 DIAGNOSIS — Z95828 Presence of other vascular implants and grafts: Secondary | ICD-10-CM

## 2023-03-14 DIAGNOSIS — Z79899 Other long term (current) drug therapy: Secondary | ICD-10-CM | POA: Diagnosis not present

## 2023-03-14 LAB — IRON AND TIBC
Iron: 52 ug/dL (ref 28–170)
Saturation Ratios: 20 % (ref 10.4–31.8)
TIBC: 257 ug/dL (ref 250–450)
UIBC: 205 ug/dL

## 2023-03-14 LAB — COMPREHENSIVE METABOLIC PANEL
ALT: 11 U/L (ref 0–44)
AST: 18 U/L (ref 15–41)
Albumin: 3.8 g/dL (ref 3.5–5.0)
Alkaline Phosphatase: 48 U/L (ref 38–126)
Anion gap: 14 (ref 5–15)
BUN: 50 mg/dL — ABNORMAL HIGH (ref 8–23)
CO2: 24 mmol/L (ref 22–32)
Calcium: 9.9 mg/dL (ref 8.9–10.3)
Chloride: 97 mmol/L — ABNORMAL LOW (ref 98–111)
Creatinine, Ser: 2.15 mg/dL — ABNORMAL HIGH (ref 0.44–1.00)
GFR, Estimated: 25 mL/min — ABNORMAL LOW (ref >60)
Glucose, Bld: 141 mg/dL — ABNORMAL HIGH (ref 70–99)
Potassium: 4 mmol/L (ref 3.5–5.1)
Sodium: 135 mmol/L (ref 135–145)
Total Bilirubin: 0.6 mg/dL (ref 0.0–1.2)
Total Protein: 7.2 g/dL (ref 6.5–8.1)

## 2023-03-14 LAB — CBC WITH DIFFERENTIAL/PLATELET
Abs Immature Granulocytes: 0.45 10*3/uL — ABNORMAL HIGH (ref 0.00–0.07)
Basophils Absolute: 0 10*3/uL (ref 0.0–0.1)
Basophils Relative: 0 %
Eosinophils Absolute: 0 10*3/uL (ref 0.0–0.5)
Eosinophils Relative: 0 %
HCT: 28.8 % — ABNORMAL LOW (ref 36.0–46.0)
Hemoglobin: 9.4 g/dL — ABNORMAL LOW (ref 12.0–15.0)
Immature Granulocytes: 4 %
Lymphocytes Relative: 10 %
Lymphs Abs: 1.3 10*3/uL (ref 0.7–4.0)
MCH: 29.5 pg (ref 26.0–34.0)
MCHC: 32.6 g/dL (ref 30.0–36.0)
MCV: 90.3 fL (ref 80.0–100.0)
Monocytes Absolute: 0.6 10*3/uL (ref 0.1–1.0)
Monocytes Relative: 5 %
Neutro Abs: 10.4 10*3/uL — ABNORMAL HIGH (ref 1.7–7.7)
Neutrophils Relative %: 81 %
Platelets: 258 10*3/uL (ref 150–400)
RBC: 3.19 MIL/uL — ABNORMAL LOW (ref 3.87–5.11)
RDW: 15.3 % (ref 11.5–15.5)
WBC: 12.8 10*3/uL — ABNORMAL HIGH (ref 4.0–10.5)
nRBC: 0 % (ref 0.0–0.2)

## 2023-03-14 LAB — TSH: TSH: 0.953 u[IU]/mL (ref 0.350–4.500)

## 2023-03-14 LAB — FERRITIN: Ferritin: 348 ng/mL — ABNORMAL HIGH (ref 11–307)

## 2023-03-14 LAB — MAGNESIUM: Magnesium: 2.1 mg/dL (ref 1.7–2.4)

## 2023-03-14 MED ORDER — HEPARIN SOD (PORK) LOCK FLUSH 100 UNIT/ML IV SOLN
500.0000 [IU] | Freq: Once | INTRAVENOUS | Status: AC
  Administered 2023-03-14: 500 [IU] via INTRAVENOUS

## 2023-03-14 MED ORDER — SODIUM CHLORIDE 0.9% FLUSH
10.0000 mL | INTRAVENOUS | Status: DC | PRN
  Administered 2023-03-14: 10 mL via INTRAVENOUS

## 2023-03-14 NOTE — Progress Notes (Signed)
 Kelli Hurley presented for Portacath access and flush.  Portacath located left chest wall accessed with  H 20 needle.  Good blood return present. Portacath flushed with 20ml NS and 500U/48ml Heparin  and needle removed intact.  Procedure tolerated well and without incident. Discharged from clinic by wheel chair in stable condition. Alert and oriented x 3. F/U with [facility] as scheduled.

## 2023-03-15 ENCOUNTER — Telehealth: Payer: Self-pay

## 2023-03-15 LAB — KAPPA/LAMBDA LIGHT CHAINS
Kappa free light chain: 27.4 mg/L — ABNORMAL HIGH (ref 3.3–19.4)
Kappa, lambda light chain ratio: 1.5 (ref 0.26–1.65)
Lambda free light chains: 18.3 mg/L (ref 5.7–26.3)

## 2023-03-15 LAB — URINE CULTURE

## 2023-03-15 NOTE — Telephone Encounter (Signed)
-----   Message from Garnette HERO Dahlstedt sent at 03/15/2023 11:29 AM EST ----- Please call patient.  Her culture was negative, but have her continue all of antibiotic ----- Message ----- From: Interface, [facility] Lab Results In Sent: 03/13/2023   3:36 PM EST To: Garnette Shack, MD

## 2023-03-15 NOTE — Telephone Encounter (Signed)
Patient was made aware and verbalized understanding

## 2023-03-19 ENCOUNTER — Other Ambulatory Visit: Payer: Self-pay | Admitting: Hematology

## 2023-03-20 ENCOUNTER — Ambulatory Visit (INDEPENDENT_AMBULATORY_CARE_PROVIDER_SITE_OTHER): Payer: 59 | Admitting: General Surgery

## 2023-03-20 ENCOUNTER — Encounter: Payer: Self-pay | Admitting: General Surgery

## 2023-03-20 ENCOUNTER — Encounter (HOSPITAL_COMMUNITY): Payer: Self-pay | Admitting: Hematology

## 2023-03-20 VITALS — BP 125/76 | HR 82 | Temp 98.5°F | Resp 14 | Ht 62.0 in | Wt 295.0 lb

## 2023-03-20 DIAGNOSIS — Z433 Encounter for attention to colostomy: Secondary | ICD-10-CM

## 2023-03-20 LAB — PROTEIN ELECTROPHORESIS, SERUM
A/G Ratio: 1.2 (ref 0.7–1.7)
Albumin ELP: 3.6 g/dL (ref 2.9–4.4)
Alpha-1-Globulin: 0.3 g/dL (ref 0.0–0.4)
Alpha-2-Globulin: 0.8 g/dL (ref 0.4–1.0)
Beta Globulin: 1 g/dL (ref 0.7–1.3)
Gamma Globulin: 1 g/dL (ref 0.4–1.8)
Globulin, Total: 3.1 g/dL (ref 2.2–3.9)
M-Spike, %: 0.3 g/dL — ABNORMAL HIGH
Total Protein ELP: 6.7 g/dL (ref 6.0–8.5)

## 2023-03-20 MED ORDER — OXYCODONE HCL 5 MG PO TABS: 5.0000 mg | ORAL_TABLET | ORAL | 0 refills | Status: AC | PRN

## 2023-03-20 NOTE — Patient Instructions (Addendum)
Will see you in 6 weeks. Continue ostomy care. Office will reach out to the ostomy RN clinic and let them know you want another appt. They should be in touch. If you have not heard from them by the end of the week, give them a call.  Diet and activity as tolerated

## 2023-03-20 NOTE — Progress Notes (Signed)
[facility]  Improving. Bag is staying on for 2-3 days. Needs to see the ostomy RN again to see if they have any other suggestions.   BP 125/76   Pulse 82   Temp 98.5 F (36.9 C) (Oral)   Resp 14   Ht 5\' 2"  (1.575 m)   Wt 295 lb (133.8 kg)   SpO2 93%   BMI 53.96 kg/m  Colostomy bag in place, stool in bag Midline healed  Patient s/p end colostomy for colovaginal fistula. Right ureter injury and psoas hitch, reimplantation, stent in place still due to UTI. Foley out. Seeing Dr. Audelia Acton after UTI treated per notes to get stent out.   Over all improving and doing well.  Will see you in 6 weeks. Continue ostomy care. Office will reach out to the ostomy RN clinic and let them know you want another appt. They should be in touch. If you have not heard from them by the end of the week, give them a call.  Diet and activity as tolerated  Last refill on roxicodone for breakthrough pain Roxicodone 5 mg q 4 PRN # 8. She gets percocet at baseline.   Future Appointments  Date Time Provider Department Center  03/22/2023  4:00 PM Doreatha Massed, MD [facility] None  03/27/2023 11:00 AM Marcine Matar, MD [facility] None  04/10/2023 11:30 AM [facility] 2 [facility] Pattricia Boss [facility]  04/16/2023  8:45 AM Oretha Milch, MD [facility]  05/01/2023 10:15 AM Lucretia Roers, MD [facility] None    Algis Greenhouse, MD [facility] [address] Vella Raring [place], [place] [address] [phone] (office)

## 2023-03-21 ENCOUNTER — Encounter (HOSPITAL_COMMUNITY): Payer: Self-pay | Admitting: Hematology

## 2023-03-21 ENCOUNTER — Inpatient Hospital Stay: Payer: 59 | Admitting: Hematology

## 2023-03-21 LAB — IMMUNOFIXATION ELECTROPHORESIS
IgA: 105 mg/dL (ref 87–352)
IgG (Immunoglobin G), Serum: 1052 mg/dL (ref 586–1602)
IgM (Immunoglobulin M), Srm: 38 mg/dL (ref 26–217)
Total Protein ELP: 6.8 g/dL (ref 6.0–8.5)

## 2023-03-22 ENCOUNTER — Other Ambulatory Visit: Payer: Self-pay

## 2023-03-22 ENCOUNTER — Inpatient Hospital Stay: Payer: 59 | Admitting: Hematology

## 2023-03-22 DIAGNOSIS — M25562 Pain in left knee: Secondary | ICD-10-CM

## 2023-03-22 DIAGNOSIS — M25561 Pain in right knee: Secondary | ICD-10-CM

## 2023-03-22 DIAGNOSIS — G8929 Other chronic pain: Secondary | ICD-10-CM | POA: Diagnosis not present

## 2023-03-22 MED ORDER — POMALIDOMIDE 4 MG PO CAPS: 4.0000 mg | ORAL_CAPSULE | Freq: Every day | ORAL | 0 refills | Status: DC

## 2023-03-22 MED ORDER — OXYCODONE-ACETAMINOPHEN 5-325 MG PO TABS: 1.0000 | ORAL_TABLET | Freq: Two times a day (BID) | ORAL | 0 refills | Status: DC | PRN

## 2023-03-22 NOTE — Telephone Encounter (Signed)
Chart reviewed. Pomalyst refilled per last office note with Dr. Ellin Saba.

## 2023-03-22 NOTE — Progress Notes (Signed)
Virtual Visit via Telephone Note  I connected with Kelli Hurley on 03/22/23 at  3:45 PM EST by telephone and verified that I am speaking with the correct person using two identifiers.  Location: Patient: At home Provider: At office   I discussed the limitations, risks, security and privacy concerns of performing an evaluation and management service by telephone and the availability of in person appointments. I also discussed with the patient that there may be a patient responsible charge related to this service. The patient expressed understanding and agreed to proceed.   History of Present Illness: Kelli Hurley is a 68 y.o. female being called today for myeloma lab results and was last seen by me on 12/20/22. She is being managed by me for relapsed IgG kappa multiple myeloma, currently treated with Pomalyst and dexamethasone. Treatment was held 1 week before and 4 weeks after colectomy, colostomy, and unintended uretal injury done on 01/24/23 under Dr. Henreitta Leber and right ureteroneocystotomy with Psoas Hitch performed by Dr. Ronne Binning.    Observations/Objective: She had a sigmoid colectomy and end colostomy on 01/24/2023.  She was discharged from the hospital on 01/30/2024.  I have reviewed hospitalization records.  She has started Pomalyst and dexamethasone back around 02/21/2023.  She is tolerating it very well.  Assessment and Plan:  1.  Relapsed IgG kappa multiple myeloma: - Reviewed labs from 03/14/2023: M spike is 0.3 g.  FLC ratio is 1.5.  Kappa light chains at 27.  Immunofixation positive for IgG kappa. - Creatinine is stable at 2.15, calcium normal. - Overall her myeloma labs are stable despite her being off of myeloma therapy for 1 month perioperatively. - She is tolerating pomalidomide and dexamethasone very well.  Continue treatment send see me back in 12 weeks in the office.  2.  Normocytic anemia: - Hemoglobin improved to 9.4.  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  Will check ferritin and iron panel at next visit.  3.  Peripheral neuropathy: -Continue Lyrica 75 mg twice daily.  4.  Thromboprophylaxis: -Continue aspirin 81 mg daily.  5.  Bilateral knee pains: - Continue Percocet twice daily as needed.  I have sent a refill.  Follow Up Instructions:    I discussed the assessment and treatment plan with the patient. The patient was provided an opportunity to ask questions and all were answered. The patient agreed with the plan and demonstrated an understanding of the instructions.   The patient was advised to call back or seek an in-person evaluation if the symptoms worsen or if the condition fails to improve as anticipated.  I provided 30 minutes of non-face-to-face time during this encounter.    Alben Deeds Teague,acting as a Neurosurgeon for Doreatha Massed, MD.,have documented all relevant documentation on the behalf of Doreatha Massed, MD,as directed by  Doreatha Massed, MD while in the presence of Doreatha Massed, MD.  I, Doreatha Massed MD, have reviewed the above documentation for accuracy and completeness, and I agree with the above.    Doreatha Massed, MD

## 2023-03-26 NOTE — Progress Notes (Signed)
History of Present Illness: Kelli Hurley is a 68 y.o. year old female who comes in today for right ureteral stent extraction.  She underwent sigmoid colectomy with an end colostomy on 12.18.2024 for history of diverticulitis with colovesical fistula.  There was an unintended right ureteral injury during that procedure.  Right ureteroneocystostomy and psoas hitch were performed by Dr. Ronne Binning.    Past Medical History:  Diagnosis Date   Anemia    Arthritis    Asthma    Cancer (HCC)    Claustrophobia 10/05/2014   Diabetes mellitus without complication (HCC)    Dyspnea    Hypertension    Leukopenia 08/03/2014   Normocytic hypochromic anemia 08/03/2014   Renal disorder    stage 3    Sleep apnea     Past Surgical History:  Procedure Laterality Date   ABDOMINAL HYSTERECTOMY     CARDIAC SURGERY     56 months old. States she had a leaky valve.    COLECTOMY WITH COLOSTOMY CREATION/HARTMANN PROCEDURE N/A 01/24/2023   Procedure: EXPLORATORY LAPAROTOMY;  Surgeon: Lucretia Roers, MD;  Location: [facility];  Service: General;  Laterality: N/A;   COLON RESECTION SIGMOID  01/24/2023   Procedure: SIGMOID COLECTOMY WITH END COLOSTOMY;  Surgeon: Lucretia Roers, MD;  Location: [facility];  Service: General;;   COLONOSCOPY  03/26/2009   Dr. Darrick Penna; normal colon, small internal hemorrhoids.  Recommended repeat colonoscopy in 10 years.   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"   PORTACATH PLACEMENT Left 02/21/2019   Procedure: INSERTION PORT-A-CATH;  Surgeon: Lucretia Roers, MD;  Location: [facility];  Service: General;  Laterality: Left;    Home Medications:   (Not in a hospital admission)   Allergies:  Allergies  Allergen Reactions   Diclofenac Swelling    Per pt, facial swelling  Other Reaction(s): Other (See Comments)  Per pt, facial swelling, Per pt, facial swelling    Family History  Problem Relation Age of Onset   Cancer Mother    Hypertension Mother    Cancer Father    Hypertension Father    Hypertension Sister    Hypertension Sister    Hypertension Sister    Hypertension Sister    Cancer Maternal Grandmother    Cancer Cousin    Hypertension Brother    Prostate cancer Brother    Hypertension Brother    Hypertension Brother    Colon cancer Neg Hx    Colon polyps Neg Hx     Social History:  reports that she has never smoked. She has never used smokeless tobacco. She reports that she does not drink alcohol and does not use drugs.  ROS: A complete review of systems was performed.  All systems are negative except for pertinent findings as noted.  Physical Exam:  Vital signs in last 24 hours: @VSRANGES @ General:  Alert and oriented, No acute distress HEENT: Normocephalic, atraumatic Neck: No JVD or lymphadenopathy Cardiovascular: Regular rate  Lungs: Normal inspiratory/expiratory excursion Neurologic: Grossly intact  I have reviewed prior pt op notes  I have reviewed urinalysis results--appears in infected  I have independently reviewed prior  imaging  I have reviewed prior urine culture   Cystoscopy with stent extraction procedure Note:  Indication: Bilateral stent placement  After informed consent and discussion of the procedure and its risks, Kelli Hurley was positioned and prepped in the standard fashion.  Cystoscopy was performed with a flexible cystoscope.   Findings: Urethra: Normal Bladder: 2 stents present, each 1 removed without difficulty  The patient tolerated the procedure well.      Impression/Assessment:  History of colovesical fistula, status post colectomy/cystorrhaphy.   Unintended right ureteral injury necessitating right ureteral reimplantation with psoas hitch.  She actually had 2 stents in place  Plan:  Urine was sent for culture  Antibiotic administered  I will have her follow-up in 6 weeks after renal ultrasound  Kelli Hurley 03/26/2023, 12:40 PM  Kelli Millard. Kortny Lirette MD

## 2023-03-27 ENCOUNTER — Ambulatory Visit (INDEPENDENT_AMBULATORY_CARE_PROVIDER_SITE_OTHER): Payer: 59 | Admitting: Urology

## 2023-03-27 VITALS — BP 146/84 | HR 83

## 2023-03-27 DIAGNOSIS — Z8742 Personal history of other diseases of the female genital tract: Secondary | ICD-10-CM | POA: Diagnosis not present

## 2023-03-27 DIAGNOSIS — N824 Other female intestinal-genital tract fistulae: Secondary | ICD-10-CM

## 2023-03-27 DIAGNOSIS — Z466 Encounter for fitting and adjustment of urinary device: Secondary | ICD-10-CM | POA: Diagnosis not present

## 2023-03-27 DIAGNOSIS — S3710XD Unspecified injury of ureter, subsequent encounter: Secondary | ICD-10-CM

## 2023-03-27 LAB — URINALYSIS, ROUTINE W REFLEX MICROSCOPIC
Bilirubin, UA: NEGATIVE
Glucose, UA: NEGATIVE
Ketones, UA: NEGATIVE
Nitrite, UA: NEGATIVE
Specific Gravity, UA: 1.02 (ref 1.005–1.030)
Urobilinogen, Ur: 0.2 mg/dL (ref 0.2–1.0)
pH, UA: 6 (ref 5.0–7.5)

## 2023-03-27 LAB — MICROSCOPIC EXAMINATION

## 2023-03-27 MED ORDER — CIPROFLOXACIN HCL 500 MG PO TABS
500.0000 mg | ORAL_TABLET | Freq: Once | ORAL | Status: AC
  Administered 2023-03-27: 500 mg via ORAL

## 2023-03-29 LAB — URINE CULTURE

## 2023-04-02 ENCOUNTER — Ambulatory Visit (HOSPITAL_COMMUNITY)
Admission: RE | Admit: 2023-04-02 | Discharge: 2023-04-02 | Disposition: A | Discharge status: Home / Self Care | Payer: 59 | Source: Ambulatory Visit | Attending: Plastic Surgery | Admitting: Plastic Surgery

## 2023-04-02 DIAGNOSIS — K9400 Colostomy complication, unspecified: Secondary | ICD-10-CM | POA: Diagnosis not present

## 2023-04-02 DIAGNOSIS — L24B3 Irritant contact dermatitis related to fecal or urinary stoma or fistula: Secondary | ICD-10-CM | POA: Diagnosis not present

## 2023-04-02 DIAGNOSIS — Z433 Encounter for attention to colostomy: Secondary | ICD-10-CM | POA: Insufficient documentation

## 2023-04-02 NOTE — Progress Notes (Signed)
 [facility]   Reason for visit:  LLQ colostomy in abdominal crease to directs to umbilicus HPI:  Colovaginal fistula with end colostomy Past Medical History:  Diagnosis Date   Anemia    Arthritis    Asthma    Cancer (HCC)    Claustrophobia 10/05/2014   Diabetes mellitus without complication (HCC)    Dyspnea    Hypertension    Leukopenia 08/03/2014   Normocytic hypochromic anemia 08/03/2014   Renal disorder    stage 3    Sleep apnea    Family History  Problem Relation Age of Onset   Cancer Mother    Hypertension Mother    Cancer Father    Hypertension Father    Hypertension Sister    Hypertension Sister    Hypertension Sister    Hypertension Sister    Cancer Maternal Grandmother    Cancer Cousin    Hypertension Brother    Prostate cancer Brother    Hypertension Brother    Hypertension Brother    Colon cancer Neg Hx    Colon polyps Neg Hx    Allergies  Allergen Reactions   Diclofenac Swelling    Per pt, facial swelling  Other Reaction(s): Other (See Comments)  Per pt, facial swelling, Per pt, facial swelling   Current Outpatient Medications  Medication Sig Dispense Refill Last Dose/Taking   acetaminophen (TYLENOL) 650 MG CR tablet Take 650 mg by mouth every 8 (eight) hours as needed for pain.      albuterol (VENTOLIN HFA) 108 (90 Base) MCG/ACT inhaler Inhale 2 puffs into the lungs every 6 (six) hours as needed for wheezing or shortness of breath. 8 g 3    apixaban (ELIQUIS) 5 MG TABS tablet Take 1 tablet (5 mg total) by mouth 2 (two) times daily. 60 tablet 5    cephALEXin (KEFLEX) 500 MG capsule Take 1 capsule (500 mg total) by mouth 3 (three) times daily. 20 capsule 0    [Paused] dexamethasone (DECADRON) 4 MG tablet TAKE FIVE TABLETS BY MOUTH ONCE A WEEK 30 tablet 6    docusate sodium (COLACE) 100 MG capsule Take 1 capsule (100 mg total) by mouth 2 (two) times daily. 30 capsule 0    furosemide (LASIX) 20 MG tablet Take 40 mg by mouth 2 (two)  times daily.      gabapentin (NEURONTIN) 300 MG capsule Take 300 mg by mouth 3 (three) times daily.      Infant Care [facility]) OINT Apply 1 Application topically daily as needed (for skin irritation).      lidocaine-prilocaine (EMLA) cream APPLY 1 QUARTER SIZED AMOUNT 1 HOUR PRIOR TO TREATMENT 30 g 10    magnesium oxide (MAG-OX) 400 (240 Mg) MG tablet TAKE 1 TABLET BY MOUTH TWICE DAILY (Patient taking differently: Take 400 mg by mouth daily.) 60 tablet 6    methocarbamol (ROBAXIN) 500 MG tablet Take 1 tablet (500 mg total) by mouth every 8 (eight) hours as needed for muscle spasms. 30 tablet 0    metolazone (ZAROXOLYN) 2.5 MG tablet Take 1 tablet (2.5 mg total) by mouth 3 (three) times a week. Mon., Wed., and Friday 30 tablet 4    mirabegron ER (MYRBETRIQ) 25 MG TB24 tablet Take 1 tablet (25 mg total) by mouth daily. 30 tablet 0    Multiple Vitamin (TAB-A-VITE) TABS TAKE 1 TABLET BY MOUTH ONCE DAILY. (Patient taking differently: Take 1 tablet by mouth daily.) 30 tablet 11    ondansetron (ZOFRAN-ODT) 4 MG disintegrating  tablet Take 1 tablet (4 mg total) by mouth every 6 (six) hours as needed for nausea. 20 tablet 0    oxyCODONE (OXY IR/ROXICODONE) 5 MG immediate release tablet Take 1 tablet (5 mg total) by mouth every 4 (four) hours as needed for severe pain (pain score 7-10) or breakthrough pain. 8 tablet 0    oxyCODONE-acetaminophen (PERCOCET/ROXICET) 5-325 MG tablet Take 1 tablet by mouth every 12 (twelve) hours as needed for severe pain (pain score 7-10). TAKE 1 OR 2 TABLETS BY MOUTH EVERY TWELVE HOURS AS NEEDED for severe pain 60 tablet 0    pomalidomide (POMALYST) 4 MG capsule Take 1 capsule (4 mg total) by mouth daily. 21 days on, 7 days off 21 capsule 0    potassium chloride SA (KLOR-CON M) 20 MEQ tablet TAKE 1 TABLET BY MOUTH THREE TIMES DAILY (Patient taking differently: Take 40 mEq by mouth daily.) 90 tablet 10    pregabalin (LYRICA) 75 MG capsule TAKE ONE CAPSULE BY MOUTH TWICE  DAILY 60 capsule 3    rosuvastatin (CRESTOR) 10 MG tablet TAKE ONE TABLET BY MOUTH ONCE DAILY 90 tablet 3    Semaglutide, 2 MG/DOSE, (OZEMPIC, 2 MG/DOSE,) 8 MG/3ML SOPN Inject 2 mg into the skin once a week. (Patient taking differently: Inject 2 mg into the skin every Tuesday.) 3 mL 11    trolamine salicylate (ASPERCREME) 10 % cream Apply 1 application topically 2 (two) times daily as needed for muscle pain.      VITAMIN D PO Take by mouth.      No current facility-administered medications for this encounter.   Facility-Administered Medications Ordered in Other Encounters  Medication Dose Route Frequency Provider Last Rate Last Admin   heparin lock flush 100 unit/mL  500 Units Intravenous Once Penland, Novella Olive, MD       sodium chloride 0.9 % injection 10 mL  10 mL Intravenous Once Penland, Novella Olive, MD       ROS  Review of Systems  Gastrointestinal:        Rounded obese abdomen LLQ colostomy  Musculoskeletal:        Uses wheelchair in hospital   Skin:  Positive for rash.       Improving contact dermatitis  Psychiatric/Behavioral: Negative.    All other systems reviewed and are negative.  Vital signs:  BP 115/70 (BP Location: Left Arm)   Pulse 85   Temp 98.3 F (36.8 C) (Oral)   Resp 18   SpO2 97%  Exam:  Physical Exam Vitals reviewed.  Constitutional:      Appearance: She is obese.  HENT:     Mouth/Throat:     Mouth: Mucous membranes are moist.  Cardiovascular:     Rate and Rhythm: Normal rate and regular rhythm.     Pulses: Normal pulses.  Pulmonary:     Effort: Pulmonary effort is normal.     Breath sounds: Normal breath sounds.  Abdominal:     Palpations: Abdomen is soft.  Skin:    General: Skin is warm and dry.     Findings: Erythema present.  Neurological:     General: No focal deficit present.     Mental Status: She is alert and oriented to person, place, and time. Mental status is at baseline.  Psychiatric:        Mood and Affect: Mood normal.         Behavior: Behavior normal.        Thought Content: Thought content normal.  Stoma type/location:  LLQ colostomy Stomal assessment/size:  1 3/8" pink and moist  creasing at 3 and 9 o'clock.  Peristomal assessment:  skin has improved. They are now getting a 2-3 day wear time. This is much improved.  Daughter provides ostomy care Treatment options for stomal/peristomal skin: 1 piece convex  stoma paste or barrier ring in peristomal creasing.  Barrier ring around stoma.  POwder and skin prep to peristomal skin . 1 piece convex pouch with ostomy belt.  Output: soft brown stool Ostomy pouching: 1pc. Convex with belt. New belt provided.  Wears barrier strips to secure pouch.  Education provided:  see back as needed.     Impression/dx  Colostomy complication Contact dermatitis  Discussion  See back as needed.  Plan  Call clinic as needed.  Improved peristomal skin and pouch wear times.     Visit time: 45 minutes.   Mike Gip FNP-BC

## 2023-04-06 NOTE — Discharge Instructions (Signed)
 Continue same pouching.  Much improved

## 2023-04-10 ENCOUNTER — Ambulatory Visit (HOSPITAL_COMMUNITY): Payer: 59

## 2023-04-16 ENCOUNTER — Ambulatory Visit (HOSPITAL_BASED_OUTPATIENT_CLINIC_OR_DEPARTMENT_OTHER): Payer: 59 | Admitting: Pulmonary Disease

## 2023-04-18 ENCOUNTER — Other Ambulatory Visit: Payer: Self-pay

## 2023-04-18 MED ORDER — POMALIDOMIDE 4 MG PO CAPS: 4.0000 mg | ORAL_CAPSULE | Freq: Every day | ORAL | 0 refills | Status: DC

## 2023-04-18 NOTE — Telephone Encounter (Signed)
 Chart reviewed. Pomalyst refilled per last office note with Dr. Ellin Saba.

## 2023-04-27 ENCOUNTER — Other Ambulatory Visit: Payer: Self-pay | Admitting: Hematology

## 2023-04-27 DIAGNOSIS — C9000 Multiple myeloma not having achieved remission: Secondary | ICD-10-CM

## 2023-04-30 ENCOUNTER — Other Ambulatory Visit: Payer: Self-pay | Admitting: Hematology

## 2023-04-30 ENCOUNTER — Encounter (HOSPITAL_COMMUNITY): Payer: Self-pay | Admitting: Hematology

## 2023-05-01 ENCOUNTER — Encounter (HOSPITAL_COMMUNITY): Payer: Self-pay | Admitting: Hematology

## 2023-05-01 ENCOUNTER — Encounter: Payer: Self-pay | Admitting: General Surgery

## 2023-05-01 ENCOUNTER — Ambulatory Visit (INDEPENDENT_AMBULATORY_CARE_PROVIDER_SITE_OTHER): Payer: 59 | Admitting: General Surgery

## 2023-05-01 VITALS — BP 134/78 | HR 76 | Temp 98.4°F | Resp 16 | Ht 62.0 in | Wt 295.0 lb

## 2023-05-01 DIAGNOSIS — Z433 Encounter for attention to colostomy: Secondary | ICD-10-CM | POA: Diagnosis not present

## 2023-05-01 NOTE — Progress Notes (Unsigned)
 [facility]  Doing well. Ostomy appliance staying on for at least 2-3 days but she is not trying to have it stay on longer. [place] has removed her stents.   Eating and getting stronger.  BP 134/78   Pulse 76   Temp 98.4 F (36.9 C) (Oral)   Resp 16   Ht 5\' 2"  (1.575 m)   Wt 295 lb (133.8 kg)   SpO2 92%   BMI 53.96 kg/m  Ostomy with stool in bag Midline healed  Patient s/p end colostomy for colovaginal fistula. Right ureter injury and psoas hitch, reimplantation, stent in place still due to UTI. Foley and stents out. Discussed that it may be the best option given all of her health issues, myeloma, obesity, etc and the scarring and issues and ureter injury that we leave this ostomy permanent.   Continue ostomy care and try to leave the bag on for more days, see how long you can go. Some people get a week or more with one wafer application.   Start to consider what you want you think about the ostomy being permeant versus if you want to pursue options for reversal. Reversal would involve some other imaging and likely referral to tertiary care [place], [place], [place]) for their opinion given the extent of inflammation and scarring expected in the pelvis region.  Future Appointments  Date Time Provider Department Center  05/08/2023  2:15 PM Marcine Matar, MD AUR-AUR None  06/04/2023 11:30 AM AP-US 5 AP-US [place] [facility]  06/14/2023  2:00 PM AP-ACAPA NURSE [facility] None  06/26/2023 10:15 AM Oretha Milch, MD [facility]-PUL [facility]  06/28/2023  3:00 PM Doreatha Massed, MD [facility] None  07/05/2023  1:00 PM Lucretia Roers, MD RS-RS None   Algis Greenhouse, MD [facility] [address] Vella Raring [place], [place] [address] [phone] (office)

## 2023-05-01 NOTE — Patient Instructions (Addendum)
 Continue ostomy care and try to leave the bag on for more days, see how long you can go. Some people get a week or more with one wafer application.   Start to consider what you want you think about the ostomy being permeant versus if you want to pursue options for reversal. Reversal would involve some other imaging and likely referral to tertiary care [place], [place], [place]) for their opinion given the extent of inflammation and scarring expected in the pelvis region.

## 2023-05-02 ENCOUNTER — Encounter (HOSPITAL_COMMUNITY): Payer: Self-pay | Admitting: Hematology

## 2023-05-08 ENCOUNTER — Ambulatory Visit: Payer: 59 | Admitting: Urology

## 2023-05-10 ENCOUNTER — Other Ambulatory Visit: Payer: Self-pay

## 2023-05-10 DIAGNOSIS — G8929 Other chronic pain: Secondary | ICD-10-CM

## 2023-05-10 MED ORDER — OXYCODONE-ACETAMINOPHEN 5-325 MG PO TABS: 1.0000 | ORAL_TABLET | Freq: Two times a day (BID) | ORAL | 0 refills | Status: DC | PRN

## 2023-05-16 ENCOUNTER — Other Ambulatory Visit: Payer: Self-pay

## 2023-05-16 MED ORDER — POMALIDOMIDE 4 MG PO CAPS: 4.0000 mg | ORAL_CAPSULE | Freq: Every day | ORAL | 0 refills | Status: DC

## 2023-05-16 NOTE — Telephone Encounter (Signed)
 Chart reviewed. Pomalyst refilled per last office note with Dr. Ellin Saba.

## 2023-05-20 ENCOUNTER — Encounter (HOSPITAL_COMMUNITY): Payer: Self-pay | Admitting: Hematology

## 2023-05-22 ENCOUNTER — Encounter (HOSPITAL_BASED_OUTPATIENT_CLINIC_OR_DEPARTMENT_OTHER): Payer: Self-pay

## 2023-05-31 ENCOUNTER — Encounter (HOSPITAL_COMMUNITY): Payer: Self-pay | Admitting: Hematology

## 2023-06-04 ENCOUNTER — Ambulatory Visit (HOSPITAL_COMMUNITY)
Admission: RE | Admit: 2023-06-04 | Discharge: 2023-06-04 | Disposition: A | Discharge status: Home / Self Care | Payer: 59 | Source: Ambulatory Visit | Attending: Urology | Admitting: Urology

## 2023-06-04 DIAGNOSIS — S3710XD Unspecified injury of ureter, subsequent encounter: Secondary | ICD-10-CM | POA: Insufficient documentation

## 2023-06-05 ENCOUNTER — Encounter: Payer: Self-pay | Admitting: Urology

## 2023-06-05 ENCOUNTER — Other Ambulatory Visit: Payer: Self-pay | Admitting: Hematology

## 2023-06-12 ENCOUNTER — Ambulatory Visit: Admitting: Urology

## 2023-06-12 ENCOUNTER — Other Ambulatory Visit: Payer: Self-pay | Admitting: Hematology

## 2023-06-12 DIAGNOSIS — C9000 Multiple myeloma not having achieved remission: Secondary | ICD-10-CM

## 2023-06-12 NOTE — Telephone Encounter (Signed)
 Refill sent for Pomalyst  4 mg tablets daily 21 days on and 7 days off.  Patient is tolerating and is to continue therapy per provider.

## 2023-06-14 ENCOUNTER — Inpatient Hospital Stay: Payer: 59 | Attending: Hematology

## 2023-06-14 VITALS — BP 105/67 | HR 78 | Temp 97.9°F | Resp 18

## 2023-06-14 DIAGNOSIS — Z7901 Long term (current) use of anticoagulants: Secondary | ICD-10-CM | POA: Insufficient documentation

## 2023-06-14 DIAGNOSIS — Z7982 Long term (current) use of aspirin: Secondary | ICD-10-CM | POA: Diagnosis not present

## 2023-06-14 DIAGNOSIS — G629 Polyneuropathy, unspecified: Secondary | ICD-10-CM | POA: Diagnosis not present

## 2023-06-14 DIAGNOSIS — M25561 Pain in right knee: Secondary | ICD-10-CM | POA: Insufficient documentation

## 2023-06-14 DIAGNOSIS — Z7952 Long term (current) use of systemic steroids: Secondary | ICD-10-CM | POA: Diagnosis not present

## 2023-06-14 DIAGNOSIS — E1122 Type 2 diabetes mellitus with diabetic chronic kidney disease: Secondary | ICD-10-CM | POA: Diagnosis not present

## 2023-06-14 DIAGNOSIS — Z90710 Acquired absence of both cervix and uterus: Secondary | ICD-10-CM | POA: Diagnosis not present

## 2023-06-14 DIAGNOSIS — D631 Anemia in chronic kidney disease: Secondary | ICD-10-CM | POA: Insufficient documentation

## 2023-06-14 DIAGNOSIS — Z8249 Family history of ischemic heart disease and other diseases of the circulatory system: Secondary | ICD-10-CM | POA: Insufficient documentation

## 2023-06-14 DIAGNOSIS — N189 Chronic kidney disease, unspecified: Secondary | ICD-10-CM | POA: Insufficient documentation

## 2023-06-14 DIAGNOSIS — M25562 Pain in left knee: Secondary | ICD-10-CM | POA: Insufficient documentation

## 2023-06-14 DIAGNOSIS — C9000 Multiple myeloma not having achieved remission: Secondary | ICD-10-CM

## 2023-06-14 DIAGNOSIS — Z809 Family history of malignant neoplasm, unspecified: Secondary | ICD-10-CM | POA: Insufficient documentation

## 2023-06-14 DIAGNOSIS — R600 Localized edema: Secondary | ICD-10-CM | POA: Insufficient documentation

## 2023-06-14 DIAGNOSIS — Z79899 Other long term (current) drug therapy: Secondary | ICD-10-CM | POA: Diagnosis not present

## 2023-06-14 DIAGNOSIS — Z8719 Personal history of other diseases of the digestive system: Secondary | ICD-10-CM | POA: Insufficient documentation

## 2023-06-14 DIAGNOSIS — N3289 Other specified disorders of bladder: Secondary | ICD-10-CM | POA: Insufficient documentation

## 2023-06-14 DIAGNOSIS — Z7961 Long term (current) use of immunomodulator: Secondary | ICD-10-CM | POA: Insufficient documentation

## 2023-06-14 DIAGNOSIS — I129 Hypertensive chronic kidney disease with stage 1 through stage 4 chronic kidney disease, or unspecified chronic kidney disease: Secondary | ICD-10-CM | POA: Insufficient documentation

## 2023-06-14 DIAGNOSIS — Z8042 Family history of malignant neoplasm of prostate: Secondary | ICD-10-CM | POA: Insufficient documentation

## 2023-06-14 DIAGNOSIS — C9002 Multiple myeloma in relapse: Secondary | ICD-10-CM | POA: Insufficient documentation

## 2023-06-14 DIAGNOSIS — D508 Other iron deficiency anemias: Secondary | ICD-10-CM

## 2023-06-14 DIAGNOSIS — Z95828 Presence of other vascular implants and grafts: Secondary | ICD-10-CM

## 2023-06-14 DIAGNOSIS — Z5986 Financial insecurity: Secondary | ICD-10-CM | POA: Diagnosis not present

## 2023-06-14 DIAGNOSIS — E611 Iron deficiency: Secondary | ICD-10-CM | POA: Diagnosis not present

## 2023-06-14 DIAGNOSIS — Z9049 Acquired absence of other specified parts of digestive tract: Secondary | ICD-10-CM | POA: Diagnosis not present

## 2023-06-14 DIAGNOSIS — E876 Hypokalemia: Secondary | ICD-10-CM | POA: Insufficient documentation

## 2023-06-14 LAB — CBC WITH DIFFERENTIAL/PLATELET
Abs Immature Granulocytes: 0 10*3/uL (ref 0.00–0.07)
Basophils Absolute: 0 10*3/uL (ref 0.0–0.1)
Basophils Relative: 0 %
Eosinophils Absolute: 0.4 10*3/uL (ref 0.0–0.5)
Eosinophils Relative: 13 %
HCT: 28.4 % — ABNORMAL LOW (ref 36.0–46.0)
Hemoglobin: 9.2 g/dL — ABNORMAL LOW (ref 12.0–15.0)
Lymphocytes Relative: 33 %
Lymphs Abs: 1.1 10*3/uL (ref 0.7–4.0)
MCH: 29.3 pg (ref 26.0–34.0)
MCHC: 32.4 g/dL (ref 30.0–36.0)
MCV: 90.4 fL (ref 80.0–100.0)
Monocytes Absolute: 0.3 10*3/uL (ref 0.1–1.0)
Monocytes Relative: 10 %
Neutro Abs: 1.5 10*3/uL — ABNORMAL LOW (ref 1.7–7.7)
Neutrophils Relative %: 44 %
Platelets: 287 10*3/uL (ref 150–400)
RBC: 3.14 MIL/uL — ABNORMAL LOW (ref 3.87–5.11)
RDW: 15.7 % — ABNORMAL HIGH (ref 11.5–15.5)
WBC: 3.4 10*3/uL — ABNORMAL LOW (ref 4.0–10.5)
nRBC: 0 % (ref 0.0–0.2)

## 2023-06-14 LAB — MAGNESIUM: Magnesium: 2 mg/dL (ref 1.7–2.4)

## 2023-06-14 LAB — IRON AND TIBC
Iron: 45 ug/dL (ref 28–170)
Saturation Ratios: 20 % (ref 10.4–31.8)
TIBC: 220 ug/dL — ABNORMAL LOW (ref 250–450)
UIBC: 175 ug/dL

## 2023-06-14 LAB — COMPREHENSIVE METABOLIC PANEL WITH GFR
ALT: 22 U/L (ref 0–44)
AST: 14 U/L — ABNORMAL LOW (ref 15–41)
Albumin: 3.2 g/dL — ABNORMAL LOW (ref 3.5–5.0)
Alkaline Phosphatase: 60 U/L (ref 38–126)
Anion gap: 11 (ref 5–15)
BUN: 61 mg/dL — ABNORMAL HIGH (ref 8–23)
CO2: 27 mmol/L (ref 22–32)
Calcium: 9.4 mg/dL (ref 8.9–10.3)
Chloride: 99 mmol/L (ref 98–111)
Creatinine, Ser: 2.44 mg/dL — ABNORMAL HIGH (ref 0.44–1.00)
GFR, Estimated: 21 mL/min — ABNORMAL LOW (ref >60)
Glucose, Bld: 93 mg/dL (ref 70–99)
Potassium: 3.4 mmol/L — ABNORMAL LOW (ref 3.5–5.1)
Sodium: 137 mmol/L (ref 135–145)
Total Bilirubin: 0.6 mg/dL (ref 0.0–1.2)
Total Protein: 6.8 g/dL (ref 6.5–8.1)

## 2023-06-14 LAB — TSH: TSH: 0.858 u[IU]/mL (ref 0.350–4.500)

## 2023-06-14 LAB — FERRITIN: Ferritin: 399 ng/mL — ABNORMAL HIGH (ref 11–307)

## 2023-06-14 MED ORDER — HEPARIN SOD (PORK) LOCK FLUSH 100 UNIT/ML IV SOLN
500.0000 [IU] | Freq: Once | INTRAVENOUS | Status: AC
  Administered 2023-06-14: 500 [IU] via INTRAVENOUS

## 2023-06-14 MED ORDER — SODIUM CHLORIDE 0.9% FLUSH
10.0000 mL | INTRAVENOUS | Status: DC | PRN
Start: 2023-06-14 — End: 2023-06-14
  Administered 2023-06-14: 10 mL via INTRAVENOUS

## 2023-06-14 NOTE — Progress Notes (Signed)
 Kelli Hurley presented for Portacath access and flush. Proper placement of portacath confirmed by CXR. Portacath located left chest wall accessed with  H 20 needle. Good blood return present. Portacath flushed with 20ml NS and 500U/83ml Heparin and needle removed intact. Procedure without incident. Patient tolerated procedure well.

## 2023-06-14 NOTE — Patient Instructions (Signed)
 [facility] - A DEPT OF [facility]  Discharge Instructions: Thank you for choosing [facility] to provide your oncology and hematology care.  If you have a lab appointment with the Cancer Center - please note that after April 8th, 2024, all labs will be drawn in the cancer center.  You do not have to check in or register with the main entrance as you have in the past but will complete your check-in in the cancer center.  Wear comfortable clothing and clothing appropriate for easy access to any Portacath or PICC line.   We strive to give you quality time with your provider. You may need to reschedule your appointment if you arrive late (15 or more minutes).  Arriving late affects you and other patients whose appointments are after yours.  Also, if you miss three or more appointments without notifying the office, you may be dismissed from the clinic at the provider's discretion.      For prescription refill requests, have your pharmacy contact our office and allow 72 hours for refills to be completed.    Today you received you port flush with labs   To help prevent nausea and vomiting after your treatment, we encourage you to take your nausea medication as directed.  BELOW ARE SYMPTOMS THAT SHOULD BE REPORTED IMMEDIATELY: *FEVER GREATER THAN 100.4 F (38 C) OR HIGHER *CHILLS OR SWEATING *NAUSEA AND VOMITING THAT IS NOT CONTROLLED WITH YOUR NAUSEA MEDICATION *UNUSUAL SHORTNESS OF BREATH *UNUSUAL BRUISING OR BLEEDING *URINARY PROBLEMS (pain or burning when urinating, or frequent urination) *BOWEL PROBLEMS (unusual diarrhea, constipation, pain near the anus) TENDERNESS IN MOUTH AND THROAT WITH OR WITHOUT PRESENCE OF ULCERS (sore throat, sores in mouth, or a toothache) UNUSUAL RASH, SWELLING OR PAIN  UNUSUAL VAGINAL DISCHARGE OR ITCHING   Items with * indicate a potential emergency and should be followed up as soon as possible or go to the Emergency  Department if any problems should occur.  Please show the CHEMOTHERAPY ALERT CARD or IMMUNOTHERAPY ALERT CARD at check-in to the Emergency Department and triage nurse.  Should you have questions after your visit or need to cancel or reschedule your appointment, please contact [facility] [place] - A DEPT OF [facility] [phone]  and follow the prompts.  Office hours are 8:00 a.m. to 4:30 p.m. Monday - Friday. Please note that voicemails left after 4:00 p.m. may not be returned until the following business day.  We are closed weekends and major holidays. You have access to a nurse at all times for urgent questions. Please call the main number to the clinic [phone] and follow the prompts.  For any non-urgent questions, you may also contact your provider using MyChart. We now offer e-Visits for anyone 40 and older to request care online for non-urgent symptoms. For details visit [email or web address].   Also download the MyChart app! Go to the app store, search "MyChart", open the app, select [place], and log in with your MyChart username and password.

## 2023-06-15 LAB — KAPPA/LAMBDA LIGHT CHAINS
Kappa free light chain: 32.2 mg/L — ABNORMAL HIGH (ref 3.3–19.4)
Kappa, lambda light chain ratio: 1.35 (ref 0.26–1.65)
Lambda free light chains: 23.9 mg/L (ref 5.7–26.3)

## 2023-06-18 LAB — PROTEIN ELECTROPHORESIS, SERUM
A/G Ratio: 1 (ref 0.7–1.7)
Albumin ELP: 3.1 g/dL (ref 2.9–4.4)
Alpha-1-Globulin: 0.4 g/dL (ref 0.0–0.4)
Alpha-2-Globulin: 1 g/dL (ref 0.4–1.0)
Beta Globulin: 0.9 g/dL (ref 0.7–1.3)
Gamma Globulin: 0.6 g/dL (ref 0.4–1.8)
Globulin, Total: 3 g/dL (ref 2.2–3.9)
M-Spike, %: 0.1 g/dL — ABNORMAL HIGH
Total Protein ELP: 6.1 g/dL (ref 6.0–8.5)

## 2023-06-20 LAB — IMMUNOFIXATION ELECTROPHORESIS
IgA: 91 mg/dL (ref 87–352)
IgG (Immunoglobin G), Serum: 685 mg/dL (ref 586–1602)
IgM (Immunoglobulin M), Srm: 46 mg/dL (ref 26–217)
Total Protein ELP: 5.7 g/dL — ABNORMAL LOW (ref 6.0–8.5)

## 2023-06-25 ENCOUNTER — Other Ambulatory Visit: Payer: Self-pay | Admitting: Hematology

## 2023-06-25 NOTE — Progress Notes (Signed)
 History of Present Illness: Kelli Hurley is a 68 y.o. year old female who comes in today for right ureteral stent extraction.  She underwent sigmoid colectomy with an end colostomy on 12.18.2024 for history of diverticulitis with colovesical fistula.  There was an unintended right ureteral injury during that procedure.  Right ureteroneocystostomy and psoas hitch were performed by Dr. Claretta Croft.  2.18.2025: Cystoscopy and bilateral stent removal performed.  5.20.2025: Here for follow-up.  She underwent renal ultrasound.  About 3 weeks ago.  This revealed prominent renal pelves bilaterally but no hydronephrosis.  She denies gross hematuria.  She has had no recurrent pneumaturia.  She is not having dysuria.  Past Medical History:  Diagnosis Date   Anemia    Arthritis    Asthma    Cancer (HCC)    Claustrophobia 10/05/2014   Diabetes mellitus without complication (HCC)    Dyspnea    Hypertension    Leukopenia 08/03/2014   Normocytic hypochromic anemia 08/03/2014   Renal disorder    stage 3    Sleep apnea     Past Surgical History:  Procedure Laterality Date   ABDOMINAL HYSTERECTOMY     CARDIAC SURGERY     58 months old. States she had a leaky valve.    COLECTOMY WITH COLOSTOMY CREATION/HARTMANN PROCEDURE N/A 01/24/2023   Procedure: EXPLORATORY LAPAROTOMY;  Surgeon: Awilda Bogus, MD;  Location: [facility];  Service: General;  Laterality: N/A;   COLON RESECTION SIGMOID  01/24/2023   Procedure: SIGMOID COLECTOMY WITH END COLOSTOMY;  Surgeon: Awilda Bogus, MD;  Location: [facility];  Service: General;;   COLONOSCOPY  03/26/2009   Dr. Nolene Baumgarten; normal colon, small internal hemorrhoids.  Recommended repeat colonoscopy in 10 years.   COLONOSCOPY WITH PROPOFOL  N/A 11/25/2019   Procedure: COLONOSCOPY WITH PROPOFOL ;  Surgeon: Vinetta Greening, DO;  Location: [facility];  Service: Endoscopy;  Laterality: N/A;  10:45am   IR PATIENT EVAL TECH 0-60 MINS  09/08/2022   IR URETERAL  STENT PLACEMENT EXISTING ACCESS LEFT  11/24/2022   OTHER SURGICAL HISTORY     heart surgery as infant to "repair hole in heart"   PORTACATH PLACEMENT Left 02/21/2019   Procedure: INSERTION PORT-A-CATH;  Surgeon: Awilda Bogus, MD;  Location: [facility];  Service: General;  Laterality: Left;    Home Medications:  (Not in a hospital admission)   Allergies:  Allergies  Allergen Reactions   Diclofenac  Swelling    Per pt, facial swelling  Other Reaction(s): Other (See Comments)  Per pt, facial swelling, Per pt, facial swelling    Family History  Problem Relation Age of Onset   Cancer Mother    Hypertension Mother    Cancer Father    Hypertension Father    Hypertension Sister    Hypertension Sister    Hypertension Sister    Hypertension Sister    Cancer Maternal Grandmother    Cancer Cousin    Hypertension Brother    Prostate cancer Brother    Hypertension Brother    Hypertension Brother    Colon cancer Neg Hx    Colon polyps Neg Hx     Social History:  reports that she has never smoked. She has never used smokeless tobacco. She reports that she does not drink alcohol and does not use drugs.  ROS: A complete review of systems was performed.  All systems are negative except for pertinent findings as noted.  Physical Exam:  Vital signs in last 24 hours: General:  Alert and oriented, No acute distress HEENT: Normocephalic, atraumatic Neck: No JVD or lymphadenopathy Cardiovascular: Regular rate  Lungs: Normal inspiratory/expiratory excursion Neurologic: Grossly intact  I have reviewed prior pt op notes  I have reviewed urinalysis results--pyuria and noted  I have independently reviewed prior imaging--most recent renal ultrasound      Impression/Assessment:  -History of colovesical fistula, status post colectomy/cystorrhaphy.  Unintended right ureteral injury necessitating right ureteral reimplantation with psoas hitch.  Recent renal ultrasound revealed no  evidence of hydronephrosis in either kidney  -Pyuria, asymptomatic  Plan:  - I will culture urine today.  Unless significant pathogen, will follow  -I will see back in 6 months after renal ultrasound for last check Roque Collar 06/25/2023, 10:39 AM  Malcolm Scrivener. Darnette Lampron MD

## 2023-06-26 ENCOUNTER — Encounter (HOSPITAL_COMMUNITY): Payer: Self-pay | Admitting: Hematology

## 2023-06-26 ENCOUNTER — Ambulatory Visit (INDEPENDENT_AMBULATORY_CARE_PROVIDER_SITE_OTHER): Admitting: Urology

## 2023-06-26 ENCOUNTER — Ambulatory Visit (HOSPITAL_BASED_OUTPATIENT_CLINIC_OR_DEPARTMENT_OTHER): Admitting: Pulmonary Disease

## 2023-06-26 ENCOUNTER — Encounter: Payer: Self-pay | Admitting: Urology

## 2023-06-26 VITALS — BP 127/72 | HR 82

## 2023-06-26 DIAGNOSIS — N131 Hydronephrosis with ureteral stricture, not elsewhere classified: Secondary | ICD-10-CM | POA: Diagnosis not present

## 2023-06-26 DIAGNOSIS — S3710XD Unspecified injury of ureter, subsequent encounter: Secondary | ICD-10-CM | POA: Diagnosis not present

## 2023-06-26 DIAGNOSIS — R8281 Pyuria: Secondary | ICD-10-CM | POA: Diagnosis not present

## 2023-06-28 ENCOUNTER — Inpatient Hospital Stay (HOSPITAL_BASED_OUTPATIENT_CLINIC_OR_DEPARTMENT_OTHER): Payer: 59 | Admitting: Hematology

## 2023-06-28 VITALS — BP 123/68 | HR 76 | Temp 97.0°F | Resp 18 | Wt 293.9 lb

## 2023-06-28 DIAGNOSIS — C9000 Multiple myeloma not having achieved remission: Secondary | ICD-10-CM | POA: Diagnosis not present

## 2023-06-28 DIAGNOSIS — C9002 Multiple myeloma in relapse: Secondary | ICD-10-CM | POA: Diagnosis not present

## 2023-06-28 NOTE — Progress Notes (Signed)
 [facility] [address], [place] [address]    Clinic Day:  06/28/23   Referring physician: Malka Sea, DO  Patient Care Team: Malka Sea, DO as PCP - General (Family Medicine) Mallipeddi, Kennyth Pean, MD as PCP - Cardiology (Cardiology) Paulett Boros, MD as Consulting Physician (Medical Oncology) Diamond Formica, MD as Consulting Physician (Pulmonary Disease)   ASSESSMENT & PLAN:   Assessment: 1.  Relapsed IgG kappa multiple myeloma with high risk features: -5 cycles of carfilzomib , pomalidomide  and dexamethasone  from 03/05/2019 through 06/25/2019. -Myeloma panel on 07/09/2019 shows M spike 0.3 g, slightly up from 0.2 g previously.  However free light chain ratio has 1.37 and improved.  Kappa light chains are 24.8. - Carfilzomib  discontinued after 08/06/2019 secondary to fluid retention. - She is currently on Pomalyst  4 mg 3 weeks on 1 week off. - PET scan on 03/22/2020 with no evidence of malignancy. - Bone marrow biopsy on 03/19/2020 showed approximately 5% plasma cells.  Chromosome analysis and FISH panel were normal.   2.  Shortness of breath on exertion: -2D echo on 02/09/2019 shows EF 60-65%.  No LVH. -Chest CT PE protocol on 02/24/2019 was negative. -Cardiac MRI on 03/07/2019 shows LVEF 56% with no amyloid.  Troponin T and proBNP were negative. -PFTs show low expiratory reserve volume consistent with body habitus.  Moderate diffusion defect which corrects to normal.  FVC, FEV1, FEV1/FVC ratio are within normal limits.  FVC is reduced relative to SVC indicates air-trapping.  No significant response after bronchodilators.  Reduced diffusion capacity indicates moderate loss of functional alveolar capillary space. -She was evaluated by Dr. Waymond Hailey. -2D echocardiogram on 08/27/2019 shows LVEF 60 to 65%.  No LVH.   3.  Myeloma bone disease: -Bone density on 07/04/2019 shows T score 1.8.    Plan: 1.  Relapsed IgG kappa multiple myeloma with high  risk features: - She is tolerating pomalidomide  and dexamethasone  very well.  She has not had surgery for colovaginal fistula or colostomy reversal yet. - I reviewed labs from 06/14/2023: Creatinine 2.44 and calcium  9.4.  M spike improved to 0.1 g from 0.3 g previously.  CBC is grossly normal.  FLC ratio is normal at 1.35 with kappa light chains 32.2.  Immunofixation was positive for IgG kappa.  Overall myeloma labs have improved slightly.  She will continue Pomalyst  4 mg 3 weeks on/1 week off.  Continue dexamethasone  20 mg weekly. - RTC 12 weeks for follow-up with repeat labs.   2.  Normocytic anemia: - Normocytic anemia from CKD and functional iron deficiency and myelosuppression.  Ferritin is 399 and saturation is 20.  Hemoglobin is stable at 9.2.   3.  Thromboprophylaxis: - Continue aspirin  81 mg daily.   4.  Neuropathy: - Continue Lyrica  75 mg twice daily.   5.  Lower extremity edema: - She will continue Lasix  40 mg twice daily and metolazone  3 times weekly.   6.  Myeloma bone disease: - Zometa  on hold due to worsening renal function after last infusion in December 2021.   7.  Hypokalemia: - Continue potassium 2 tablets daily at home.  Potassium is 3.4.   8.  Bilateral knee pains: - Continue Percocet twice daily as needed.    No orders of the defined types were placed in this encounter.     Nadeen Augusta Teague,acting as a Neurosurgeon for Paulett Boros, MD.,have documented all relevant documentation on the behalf of Paulett Boros, MD,as directed by  [employer]  Cheree Cords, MD while in the presence of Paulett Boros, MD.  I, Paulett Boros MD, have reviewed the above documentation for accuracy and completeness, and I agree with the above.     Paulett Boros, MD   5/22/20253:22 PM  CHIEF COMPLAINT:   Diagnosis: multiple myeloma    Cancer Staging  Multiple myeloma (HCC) Staging form: Multiple Myeloma, AJCC 6th Edition - Clinical stage from 10/26/2014:  Stage IIA - Signed by Doretta Gant, PA-C on 10/26/2014 - Pathologic: No stage assigned - Unsigned    Prior Therapy: 1. Bortezomib  x 8 cycles from 10/05/2014 to 04/06/2016. 2. Carfilzomib  x 6 cycles from 03/05/2019 to 08/06/2019.  Current Therapy:  Pomalyst  4 mg 3/4 weeks    HISTORY OF PRESENT ILLNESS:   Oncology History  Multiple myeloma (HCC)  08/07/2014 Imaging   Bone Survey- No lytic lesions are noted in the visualized skeleton.   09/03/2014 Bone Marrow Biopsy   NORMOCELLULAR BONE MARROW FOR AGE WITH PLASMA CELL NEOPLASM.  The plasma cell component is increased in the marrow representing an estimated 18% of all cells. Cytogenetics with 13q-, 17p- (high risk disease)   09/03/2014 Pathology Results   Cytogenetics with 13q-, 17p- (high risk disease)   09/23/2014 Initial Diagnosis   Multiple myeloma   09/30/2014 PET scan   No abnormal hypermetabolism in the neck, chest, abdomen or pelvis.   10/05/2014 - 03/22/2015 Chemotherapy   RVD   10/28/2014 Treatment Plan Change   Issues related to getting Revlimid  in a timely fashion, therefore, she received her Revlimid  on 9/21 resulting in a 12 day cycle this time instead of a 14 day cycle   11/02/2014 Imaging   CTA chest- No evidence for a large or central pulmonary embolism as described.  8 mm density along the right minor fissure could represent focal pleural thickening but indeterminate. If the patient is at high risk for bronchogenic carcinoma, follow-up c   11/23/2014 Miscellaneous   Zometa  4 mg IV monthly   04/07/2015 Bone Marrow Biopsy   Normocellular marrow with 2-4% clonal plasma cells by immunohistochemistry. FISH and cytogenetics were normal [facility])     04/2015 Miscellaneous   PRETRANSPLANT EVALUATION:  Pulmonary function tests: FEV1 100.3% / DLCO 97.9%  Echocardiogram: Normal LV function with EF 60-65%    04/27/2015 Procedure   Stem cell mobilization with filgrastim  and Mozobil  [facility])   05/06/2015 Miscellaneous   BMT  conditioning regimen with high-dose Melphalan  given [facility], Doretha Ganja); Day -1   05/07/2015 Bone Marrow Transplant   Outpatient autologous stem cell transplant [facility], Doretha Ganja); Day 0   05/18/2015 Miscellaneous   WBC engraftment;  did not require platelet transfusion during her transplant process. Lowest platelet count 28,000    05/20/2015 Procedure   Tunneled catheter removed [facility])   09/08/2015 -  Chemotherapy   Velcade  every 2 weeks   12/15/2015 Miscellaneous   Zometa  re-instituted.    12/23/2015 Imaging   Bone density- BMD as determined from Forearm Radius 33% is 0.799 g/cm2 with a T-Score of 1.2. This patient is considered normal according to World Health Organization [facility]) criteria.   04/17/2016 Miscellaneous   Started maintenance with Ninlaro .   05/04/2016 Treatment Plan Change   Zometa  switched to Xgeva  injection monthly given difficult IV access.    03/05/2019 - 08/06/2019 Chemotherapy   The patient had carfilzomib  (KYPROLIS ) 40 mg in dextrose  5 % 50 mL chemo infusion, 18 mg/m2 = 44 mg, Intravenous,  Once, 6 of 6 cycles Dose modification: 56 mg/m2 (original dose 56  mg/m2, Cycle 1, Reason: Provider Judgment) Administration: 40 mg (03/05/2019), 120 mg (03/12/2019), 120 mg (03/20/2019), 120 mg (04/02/2019), 120 mg (04/09/2019), 120 mg (04/16/2019), 120 mg (04/30/2019), 120 mg (05/08/2019), 120 mg (05/14/2019), 120 mg (05/28/2019), 120 mg (06/04/2019), 120 mg (06/11/2019), 120 mg (06/25/2019), 120 mg (07/02/2019), 120 mg (07/09/2019), 120 mg (07/23/2019), 120 mg (07/30/2019), 120 mg (08/06/2019)  for chemotherapy treatment.       INTERVAL HISTORY:   Kelli Hurley is a 68 y.o. female presenting to clinic today for follow up of multiple myeloma. She was last seen by me on 12/20/22.  Today, she states that she is doing well overall. Her appetite level is at 100%. Her energy level is at 50%.   PAST MEDICAL HISTORY:   Past Medical History: Past Medical History:  Diagnosis Date   Anemia     Arthritis    Asthma    Cancer (HCC)    Claustrophobia 10/05/2014   Diabetes mellitus without complication (HCC)    Dyspnea    Hypertension    Leukopenia 08/03/2014   Normocytic hypochromic anemia 08/03/2014   Renal disorder    stage 3    Sleep apnea     Surgical History: Past Surgical History:  Procedure Laterality Date   ABDOMINAL HYSTERECTOMY     CARDIAC SURGERY     55 months old. States she had a leaky valve.    COLECTOMY WITH COLOSTOMY CREATION/HARTMANN PROCEDURE N/A 01/24/2023   Procedure: EXPLORATORY LAPAROTOMY;  Surgeon: Awilda Bogus, MD;  Location: [facility] ORS;  Service: General;  Laterality: N/A;   COLON RESECTION SIGMOID  01/24/2023   Procedure: SIGMOID COLECTOMY WITH END COLOSTOMY;  Surgeon: Awilda Bogus, MD;  Location: [facility] ORS;  Service: General;;   COLONOSCOPY  03/26/2009   Dr. Nolene Baumgarten; normal colon, small internal hemorrhoids.  Recommended repeat colonoscopy in 10 years.   COLONOSCOPY WITH PROPOFOL  N/A 11/25/2019   Procedure: COLONOSCOPY WITH PROPOFOL ;  Surgeon: Vinetta Greening, DO;  Location: [facility] ENDO SUITE;  Service: Endoscopy;  Laterality: N/A;  10:45am   IR PATIENT EVAL TECH 0-60 MINS  09/08/2022   IR URETERAL STENT PLACEMENT EXISTING ACCESS LEFT  11/24/2022   OTHER SURGICAL HISTORY     heart surgery as infant to "repair hole in heart"   PORTACATH PLACEMENT Left 02/21/2019   Procedure: INSERTION PORT-A-CATH;  Surgeon: Awilda Bogus, MD;  Location: [facility] ORS;  Service: General;  Laterality: Left;    Social History: Social History   Socioeconomic History   Marital status: Single    Spouse name: Not on file   Number of children: Not on file   Years of education: Not on file   Highest education level: Not on file  Occupational History   Not on file  Tobacco Use   Smoking status: Never   Smokeless tobacco: Never  Vaping Use   Vaping status: Never Used  Substance and Sexual Activity   Alcohol use: No   Drug use: No   Sexual activity: Not  Currently    Birth control/protection: Surgical    Comment: divorced- 2 daughters; hyst  Other Topics Concern   Not on file  Social History Narrative   Not on file   Social Drivers of Health   Financial Resource Strain: Medium Risk (08/16/2022)   Overall Financial Resource Strain (CARDIA)    Difficulty of Paying Living Expenses: Somewhat hard  Food Insecurity: No Food Insecurity (01/24/2023)   Hunger Vital Sign    Worried About Running Out of Food in the Last  Year: Never true    Ran Out of Food in the Last Year: Never true  Transportation Needs: No Transportation Needs (01/24/2023)   PRAPARE - Administrator, Civil Service (Medical): No    Lack of Transportation (Non-Medical): No  Physical Activity: Insufficiently Active (08/16/2022)   Exercise Vital Sign    Days of Exercise per Week: 1 day    Minutes of Exercise per Session: 10 min  Stress: No Stress Concern Present (08/16/2022)   [employer] of Occupational Health - Occupational Stress Questionnaire    Feeling of Stress : Not at all  Social Connections: Moderately Isolated (08/16/2022)   Social Connection and Isolation Panel [NHANES]    Frequency of Communication with Friends and Family: More than three times a week    Frequency of Social Gatherings with Friends and Family: Once a week    Attends Religious Services: More than 4 times per year    Active Member of [employer] or Organizations: No    Attends Banker Meetings: Never    Marital Status: Divorced  Catering manager Violence: Not At Risk (01/24/2023)   Humiliation, Afraid, Rape, and Kick questionnaire    Fear of Current or Ex-Partner: No    Emotionally Abused: No    Physically Abused: No    Sexually Abused: No    Family History: Family History  Problem Relation Age of Onset   Cancer Mother    Hypertension Mother    Cancer Father    Hypertension Father    Hypertension Sister    Hypertension Sister    Hypertension Sister     Hypertension Sister    Cancer Maternal Grandmother    Cancer Cousin    Hypertension Brother    Prostate cancer Brother    Hypertension Brother    Hypertension Brother    Colon cancer Neg Hx    Colon polyps Neg Hx     Current Medications:  Current Outpatient Medications:    acetaminophen  (TYLENOL ) 650 MG CR tablet, Take 650 mg by mouth every 8 (eight) hours as needed for pain., Disp: , Rfl:    albuterol  (VENTOLIN  HFA) 108 (90 Base) MCG/ACT inhaler, Inhale 2 puffs into the lungs every 6 (six) hours as needed for wheezing or shortness of breath., Disp: 8 g, Rfl: 3   apixaban  (ELIQUIS ) 5 MG TABS tablet, Take 1 tablet (5 mg total) by mouth 2 (two) times daily., Disp: 60 tablet, Rfl: 5   dexamethasone  (DECADRON ) 4 MG tablet, TAKE FIVE TABLETS BY MOUTH ONCE A WEEK, Disp: 30 tablet, Rfl: 6   docusate sodium  (COLACE) 100 MG capsule, Take 1 capsule (100 mg total) by mouth 2 (two) times daily., Disp: 30 capsule, Rfl: 0   furosemide  (LASIX ) 20 MG tablet, Take 40 mg by mouth 2 (two) times daily., Disp: , Rfl:    gabapentin  (NEURONTIN ) 300 MG capsule, TAKE 1 CAPSULE BY MOUTH 3 TIMES DAILY, Disp: 90 capsule, Rfl: 10   Infant Care Products (DERMACLOUD) OINT, Apply 1 Application topically daily as needed (for skin irritation)., Disp: , Rfl:    lidocaine -prilocaine  (EMLA ) cream, APPLY 1 QUARTER SIZED AMOUNT 1 HOUR PRIOR TO TREATMENT, Disp: 30 g, Rfl: 10   magnesium  oxide (MAG-OX) 400 (240 Mg) MG tablet, TAKE 1 TABLET BY MOUTH TWICE DAILY (Patient taking differently: Take 400 mg by mouth daily.), Disp: 60 tablet, Rfl: 6   methocarbamol  (ROBAXIN ) 500 MG tablet, Take 1 tablet (500 mg total) by mouth every 8 (eight) hours as needed for muscle  spasms., Disp: 30 tablet, Rfl: 0   metolazone  (ZAROXOLYN ) 2.5 MG tablet, Take 1 tablet (2.5 mg total) by mouth 3 (three) times a week. Mon., Wed., and Friday, Disp: 30 tablet, Rfl: 4   mirabegron  ER (MYRBETRIQ ) 25 MG TB24 tablet, Take 1 tablet (25 mg total) by mouth daily.,  Disp: 30 tablet, Rfl: 0   Multiple Vitamin (TAB-A-VITE) TABS, TAKE 1 TABLET BY MOUTH ONCE DAILY. (Patient taking differently: Take 1 tablet by mouth daily.), Disp: 30 tablet, Rfl: 11   ondansetron  (ZOFRAN -ODT) 4 MG disintegrating tablet, Take 1 tablet (4 mg total) by mouth every 6 (six) hours as needed for nausea., Disp: 20 tablet, Rfl: 0   oxyCODONE  (OXY IR/ROXICODONE ) 5 MG immediate release tablet, Take 1 tablet (5 mg total) by mouth every 4 (four) hours as needed for severe pain (pain score 7-10) or breakthrough pain., Disp: 8 tablet, Rfl: 0   oxyCODONE -acetaminophen  (PERCOCET/ROXICET) 5-325 MG tablet, Take 1 tablet by mouth every 12 (twelve) hours as needed for severe pain (pain score 7-10). TAKE 1 OR 2 TABLETS BY MOUTH EVERY TWELVE HOURS AS NEEDED for severe pain, Disp: 60 tablet, Rfl: 0   POMALYST  4 MG capsule, Take 1 capsule (4 mg total) by mouth daily for 21 days on, 7 days off, Disp: 21 capsule, Rfl: 0   potassium chloride  SA (KLOR-CON  M) 20 MEQ tablet, TAKE 1 TABLET BY MOUTH 3 TIMES DAILY, Disp: 90 tablet, Rfl: 10   pregabalin  (LYRICA ) 75 MG capsule, TAKE ONE CAPSULE BY MOUTH TWICE DAILY, Disp: 60 capsule, Rfl: 3   rosuvastatin  (CRESTOR ) 10 MG tablet, TAKE ONE TABLET BY MOUTH ONCE DAILY, Disp: 90 tablet, Rfl: 3   Semaglutide , 2 MG/DOSE, (OZEMPIC , 2 MG/DOSE,) 8 MG/3ML SOPN, Inject 2 mg into the skin once a week. (Patient taking differently: Inject 2 mg into the skin every Tuesday.), Disp: 3 mL, Rfl: 11   trolamine salicylate (ASPERCREME) 10 % cream, Apply 1 application topically 2 (two) times daily as needed for muscle pain., Disp: , Rfl:    VITAMIN D  PO, Take by mouth., Disp: , Rfl:  No current facility-administered medications for this visit.  Facility-Administered Medications Ordered in Other Visits:    heparin  lock flush 100 unit/mL, 500 Units, Intravenous, Once, Penland, Marge Shed, MD   sodium chloride  0.9 % injection 10 mL, 10 mL, Intravenous, Once, Penland, Marge Shed, MD    Allergies: Allergies  Allergen Reactions   Diclofenac  Swelling    Per pt, facial swelling  Other Reaction(s): Other (See Comments)  Per pt, facial swelling, Per pt, facial swelling    REVIEW OF SYSTEMS:   Review of Systems  Constitutional:  Negative for chills, fatigue and fever.  HENT:   Negative for lump/mass, mouth sores, nosebleeds, sore throat and trouble swallowing.   Eyes:  Negative for eye problems.  Respiratory:  Positive for shortness of breath. Negative for cough.   Cardiovascular:  Negative for chest pain, leg swelling and palpitations.  Gastrointestinal:  Negative for abdominal pain, constipation, diarrhea, nausea and vomiting.  Genitourinary:  Negative for bladder incontinence, difficulty urinating, dysuria, frequency, hematuria and nocturia.   Musculoskeletal:  Positive for arthralgias (Knees). Negative for back pain, flank pain, myalgias and neck pain.  Skin:  Negative for itching and rash.  Neurological:  Positive for numbness. Negative for dizziness and headaches.  Hematological:  Does not bruise/bleed easily.  Psychiatric/Behavioral:  Negative for depression, sleep disturbance and suicidal ideas. The patient is not nervous/anxious.   All other systems reviewed and are negative.  VITALS:   Blood pressure 123/68, pulse 76, temperature (!) 97 F (36.1 C), temperature source Tympanic, resp. rate 18, weight 293 lb 14 oz (133.3 kg), SpO2 99%.  Wt Readings from Last 3 Encounters:  06/28/23 293 lb 14 oz (133.3 kg)  05/01/23 295 lb (133.8 kg)  03/20/23 295 lb (133.8 kg)    Body mass index is 53.75 kg/m.  Performance status (ECOG): 1 - Symptomatic but completely ambulatory  PHYSICAL EXAM:   Physical Exam Vitals and nursing note reviewed. Exam conducted with a chaperone present.  Constitutional:      Appearance: Normal appearance.  Cardiovascular:     Rate and Rhythm: Normal rate and regular rhythm.     Pulses: Normal pulses.     Heart sounds: Normal  heart sounds.  Pulmonary:     Effort: Pulmonary effort is normal.     Breath sounds: Normal breath sounds.  Abdominal:     Palpations: Abdomen is soft. There is no hepatomegaly, splenomegaly or mass.     Tenderness: There is no abdominal tenderness.  Musculoskeletal:     Right lower leg: No edema.     Left lower leg: No edema.  Lymphadenopathy:     Cervical: No cervical adenopathy.     Right cervical: No superficial, deep or posterior cervical adenopathy.    Left cervical: No superficial, deep or posterior cervical adenopathy.     Upper Body:     Right upper body: No supraclavicular or axillary adenopathy.     Left upper body: No supraclavicular or axillary adenopathy.  Neurological:     General: No focal deficit present.     Mental Status: She is alert and oriented to person, place, and time.  Psychiatric:        Mood and Affect: Mood normal.        Behavior: Behavior normal.     LABS:      Latest Ref Rng & Units 06/14/2023    2:12 PM 03/14/2023   11:14 AM 02/08/2023    1:19 PM  CBC  WBC 4.0 - 10.5 K/uL 3.4  12.8  7.6   Hemoglobin 12.0 - 15.0 g/dL 9.2  9.4  8.8   Hematocrit 36.0 - 46.0 % 28.4  28.8  27.4   Platelets 150 - 400 K/uL 287  258  375       Latest Ref Rng & Units 06/14/2023    2:12 PM 03/14/2023   11:14 AM 02/08/2023    1:19 PM  CMP  Glucose 70 - 99 mg/dL 93  161  096   BUN 8 - 23 mg/dL 61  50  28   Creatinine 0.44 - 1.00 mg/dL 0.45  4.09  8.11   Sodium 135 - 145 mmol/L 137  135  138   Potassium 3.5 - 5.1 mmol/L 3.4  4.0  3.9   Chloride 98 - 111 mmol/L 99  97  99   CO2 22 - 32 mmol/L 27  24  27    Calcium  8.9 - 10.3 mg/dL 9.4  9.9  9.6   Total Protein 6.5 - 8.1 g/dL 6.8  7.2    Total Bilirubin 0.0 - 1.2 mg/dL 0.6  0.6    Alkaline Phos 38 - 126 U/L 60  48    AST 15 - 41 U/L 14  18    ALT 0 - 44 U/L 22  11       No results found for: "CEA1", "CEA" / No results found for: "CEA1", "CEA" No  results found for: "PSA1" No results found for: "OEV035" No results  found for: "KKX381"  Lab Results  Component Value Date   TOTALPROTELP 5.7 (L) 06/14/2023   TOTALPROTELP 6.1 06/14/2023   ALBUMINELP 3.1 06/14/2023   A1GS 0.4 06/14/2023   A2GS 1.0 06/14/2023   BETS 0.9 06/14/2023   GAMS 0.6 06/14/2023   MSPIKE 0.1 (H) 06/14/2023   SPEI Comment 06/14/2023   Lab Results  Component Value Date   TIBC 220 (L) 06/14/2023   TIBC 257 03/14/2023   TIBC 277 09/28/2022   FERRITIN 399 (H) 06/14/2023   FERRITIN 348 (H) 03/14/2023   FERRITIN 404 (H) 09/28/2022   IRONPCTSAT 20 06/14/2023   IRONPCTSAT 20 03/14/2023   IRONPCTSAT 32 (H) 09/28/2022   Lab Results  Component Value Date   LDH 136 03/22/2022   LDH 134 12/13/2021   LDH 168 09/14/2021     STUDIES:   US  RENAL Result Date: 06/04/2023 CLINICAL DATA:  History of ureteral injury EXAM: RENAL / URINARY TRACT ULTRASOUND COMPLETE COMPARISON:  May 24 2020 FINDINGS: Right Kidney: Renal measurements: 10.1 x 4.9 x 5.5 cm = volume: 141.4 mL. Echogenicity within normal limits. No mass or hydronephrosis visualized. Mild fullness of renal pelvis. Left Kidney: Renal measurements: 9.1 x 3.5 x 3.8 cm = volume: 62.6 mL. Echogenicity within normal limits. No mass or hydronephrosis visualized. Mild fullness of renal pelvis. Bladder: Appears normal for degree of bladder distention. Other: None. IMPRESSION: Mild fullness of bilateral renal pelvis. No hydronephrosis. Electronically Signed   By: Anna Barnes M.D.   On: 06/04/2023 16:56

## 2023-06-28 NOTE — Progress Notes (Signed)
Patient is taking Pomalyst as prescribed.  She has not missed any doses and reports no side effects at this time.   

## 2023-06-28 NOTE — Patient Instructions (Signed)
 [facility] at [facility] Discharge Instructions   You were seen and examined today by Dr. Ellin Saba.  He reviewed the results of your lab work which are normal/stable.   We will see you back in 3 months. We will repeat lab work prior to this visit.    Return as scheduled.    Thank you for choosing [facility] at [facility] to provide your oncology and hematology care.  To afford each patient quality time with our provider, please arrive at least 15 minutes before your scheduled appointment time.   If you have a lab appointment with the Cancer Center please come in thru the Main Entrance and check in at the main information desk.  You need to re-schedule your appointment should you arrive 10 or more minutes late.  We strive to give you quality time with our providers, and arriving late affects you and other patients whose appointments are after yours.  Also, if you no show three or more times for appointments you may be dismissed from the clinic at the providers discretion.     Again, thank you for choosing [facility].  Our hope is that these requests will decrease the amount of time that you wait before being seen by our physicians.       _____________________________________________________________  Should you have questions after your visit to [facility], please contact our office at [phone] and follow the prompts.  Our office hours are 8:00 a.m. and 4:30 p.m. Monday - Friday.  Please note that voicemails left after 4:00 p.m. may not be returned until the following business day.  We are closed weekends and major holidays.  You do have access to a nurse 24-7, just call the main number to the clinic [phone] and do not press any options, hold on the line and a nurse will answer the phone.    For prescription refill requests, have your pharmacy contact our office and allow 72 hours.    Due to Covid, you  will need to wear a mask upon entering the hospital. If you do not have a mask, a mask will be given to you at the Main Entrance upon arrival. For doctor visits, patients may have 1 support person age 55 or older with them. For treatment visits, patients can not have anyone with them due to social distancing guidelines and our immunocompromised population.

## 2023-06-29 ENCOUNTER — Other Ambulatory Visit: Payer: Self-pay

## 2023-06-29 DIAGNOSIS — G8929 Other chronic pain: Secondary | ICD-10-CM

## 2023-06-29 MED ORDER — OXYCODONE-ACETAMINOPHEN 5-325 MG PO TABS
1.0000 | ORAL_TABLET | Freq: Two times a day (BID) | ORAL | 0 refills | Status: AC | PRN
Start: 2023-06-29 — End: ?

## 2023-06-30 LAB — URINE CULTURE

## 2023-07-01 ENCOUNTER — Encounter (HOSPITAL_COMMUNITY): Payer: Self-pay | Admitting: Hematology

## 2023-07-03 ENCOUNTER — Ambulatory Visit: Payer: Self-pay | Admitting: Urology

## 2023-07-05 ENCOUNTER — Encounter: Payer: Self-pay | Admitting: General Surgery

## 2023-07-05 ENCOUNTER — Other Ambulatory Visit: Payer: Self-pay | Admitting: *Deleted

## 2023-07-05 ENCOUNTER — Ambulatory Visit (INDEPENDENT_AMBULATORY_CARE_PROVIDER_SITE_OTHER): Admitting: General Surgery

## 2023-07-05 VITALS — BP 130/77 | HR 78 | Temp 98.1°F | Resp 14 | Ht 62.0 in | Wt 293.0 lb

## 2023-07-05 DIAGNOSIS — Z433 Encounter for attention to colostomy: Secondary | ICD-10-CM

## 2023-07-05 DIAGNOSIS — C9000 Multiple myeloma not having achieved remission: Secondary | ICD-10-CM

## 2023-07-05 MED ORDER — POMALIDOMIDE 4 MG PO CAPS: 4.0000 mg | ORAL_CAPSULE | Freq: Every day | ORAL | 0 refills | Status: DC

## 2023-07-05 NOTE — Telephone Encounter (Signed)
 Rx sent in for Pomalyst  4 mg tablets.  Patient is tolerating and is to continue therapy.

## 2023-07-05 NOTE — Addendum Note (Signed)
 Addended by: Cathalene Clipper on: 07/05/2023 02:48 PM   Modules accepted: Orders

## 2023-07-05 NOTE — Patient Instructions (Addendum)
 Will get you sent to tertiary care, Colorectal at [facility] for potential consideration of any colostomy reversal.  Will send that information and your records to [facility]  Follow up with me as you need.  Continue your ostomy care.

## 2023-07-05 NOTE — Progress Notes (Signed)
 [facility] Note   HPI:  69 y.o. Female presents to clinic for follow-up evaluation of her end colostomy. She is doing well but is wanting to pursue reversal. Patient reports she changes the bag about every 3 days to prevent leaks. She continues to follow with Dr. 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 She followed up with Urology after her stents have been removed and she is doing well. She denies issues.   Review of Systems:  Normal urination Ostomy output All other review of systems: otherwise negative   Vital Signs:  BP 130/77   Pulse 78   Temp 98.1 F (36.7 C) (Oral)   Resp 14   Ht 5\' 2"  (1.575 m)   Wt 293 lb (132.9 kg)   SpO2 94%   BMI 53.59 kg/m    Physical Exam:  Physical Exam Vitals reviewed.  Constitutional:      Appearance: She is obese.  HENT:     Head: Normocephalic.  Cardiovascular:     Rate and Rhythm: Normal rate.  Pulmonary:     Effort: Pulmonary effort is normal.  Abdominal:     Palpations: Abdomen is soft.     Tenderness: There is no abdominal tenderness. There is no guarding.     Comments: Left mid abdomen end colostomy in place       Assessment:  68 y.o. 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 Her stents are all out of her ureter. Discussed last time keeping her end colostomy given her obesity, multiple myeloma on treatment, ureter issues and psoas hitch, scarring. She denies any further vaginal discharge or issues with fistula.    She wants to get another opinion from a tertiary care colorectal surgeon. She prefers to go to [place]. I warned that this referral may still result in them saying she is safer to keep her colostomy.   Plan:  -Will get her referred to [facility] for consideration of reversal.  -Will send her Surgery H&P, All Surgery  Progress notes, Operative Notes from 01/2023, Urology Progress note 06/2023, Last Oncology Note 06/2023, and last Colonoscopy 2021 note, Pathology from 01/2023  All of the above recommendations were discussed with the patient and patient's family, and all of patient's and family's questions were answered to his/her/their expressed satisfaction.  PRN Follow up Call with issues Ostomy care per routine   Deena Farrier, MD [facility] [address] [place], [place] [address] [phone] (office)

## 2023-07-09 ENCOUNTER — Encounter (HOSPITAL_COMMUNITY): Payer: Self-pay | Admitting: Hematology

## 2023-07-25 ENCOUNTER — Other Ambulatory Visit: Payer: Self-pay | Admitting: Hematology

## 2023-07-25 ENCOUNTER — Other Ambulatory Visit: Payer: Self-pay | Admitting: *Deleted

## 2023-07-25 DIAGNOSIS — G8929 Other chronic pain: Secondary | ICD-10-CM

## 2023-07-25 MED ORDER — OXYCODONE-ACETAMINOPHEN 5-325 MG PO TABS: 1.0000 | ORAL_TABLET | Freq: Two times a day (BID) | ORAL | 0 refills | Status: DC | PRN

## 2023-08-02 ENCOUNTER — Other Ambulatory Visit: Payer: Self-pay | Admitting: *Deleted

## 2023-08-02 DIAGNOSIS — C9000 Multiple myeloma not having achieved remission: Secondary | ICD-10-CM

## 2023-08-02 MED ORDER — POMALIDOMIDE 4 MG PO CAPS: 4.0000 mg | ORAL_CAPSULE | Freq: Every day | ORAL | 0 refills | Status: DC

## 2023-08-08 NOTE — Progress Notes (Signed)
 [facility] Patient Consultation  Kelli Hurley is a 68 y.o. female who was referred by Kallie Manuelita BROCKS, MD for evaluation of colostomy reversal  Date of Visit: 08/08/2023  Chief Complaint: colostomy in place  HPI:  Kelli Hurley is a 68 y.o. female with PMH significant for colovaginal fistula, multiple myeloma with high risk features (on treatment, followed by Dr. Rogers), morbid obesity (BMI:55.55), OSA (on bipap), HTN, asthma, CKD. On 01/24/23, she underwent sigmoid resection with end colostomy due to colovaginal fistula at an OSH (Dr. Manuelita Kallie). A right ureter injury at the time of surgery required repair. She followed up with her local surgeon on 07/05/23 that advised against ostomy reversal.   PSHx includes hysterectomy.  She denies FHx of colorectal cancer or IBD.   Her ADLs are limited, she uses a w/c for most of her transportation.   Colonoscopy, 11/25/2019 Findings: The perianal and digital rectal examinations were normal. Non-bleeding internal hemorrhoids were found during endoscopy. Many small and large-mouthed diverticula were found in the sigmoid  colon, descending colon and transverse colon. A moderate amount of stool was found in the entire colon, making visualization difficult. Lavage of the area was performed using copious amounts, resulting in clearance with fair visualization.  Impression:  - Preparation of the colon was fair. - Non-bleeding internal hemorrhoids. - Diverticulosis in the sigmoid colon, in the  descending colon and in the transverse colon. - Stool in the entire examined colon. - No specimens collected.   History of Present Illness Kelli Hurley is a 68 year old female who presents for evaluation of ostomy reversal.  She underwent surgery in December 2024 for a colovaginal fistula, resulting in the creation of an ostomy. The surgery involved the removal of some of her colon, though she is unsure of the  specifics regarding the resection of the sigmoid colon. Since the surgery, she has not experienced any vaginal drainage, which was a symptom prior to the procedure.  She has not had a colonoscopy or any updated CT scans since the surgery. She recalls the surgery involved a large midline incision. Her past surgical history includes a partial hysterectomy. .   She is currently managing an ostomy, which she describes as 'sometimes good, sometimes bad,' noting issues with clothing due to leakage. She has been receiving support from an ostomy nurse practitioner.  She is also being treated for multiple myeloma and is currently in remission. Her treatment includes a chemotherapy pill taken for 21 days with a 7-day break.     Op Notes  01/24/23 - Sigmoid colectomy, end colostomy  Op Note - Kallie Manuelita BROCKS, MD - 01/24/2023 4:08 PM EST Formatting of this note might be different from the original. [facility] Operative Note  01/24/23  Preoperative Diagnosis: Colovaginal fistula  Postoperative Diagnosis: Colovaginal fistula, right ureter injury (see Urology Operative note)  Procedure(s) Performed: Sigmoid colectomy, end colostomy (see Urology, Dr. Little note for ureter repair, psoas hitch)  Surgeon: Manuelita BROCKS. Kallie, MD  Assistants: Oneil Budge, MD No  Anesthesia: General endotracheal  Anesthesiologist: Dr. Kendell, MD  Specimens: Sigmoid colon, additional descending colon  Estimated Blood Loss: Minimal  Blood Replacement: None  Complications: None  Wound Class: Contaminated  Operative Indications: Kelli Hurley is a 68 yo with a colovaginal fistula that caused left hydroureter back in the summer of 2024 and stent placement by IR. She has been following with Urology and given the stent in the ureter and scarring of the ureter into  the surrounding tissue of the colovaginal fistula it was felt that she would have to undergo resection. We discussed the  resection, likely colostomy, potential colostomy would be permanent, concern that she could have ureter injury and I spoke with Gyn and Urology so those service lines were available pending any potential issues. We discussed risk of bleeding, infection, and timed all of her medications to be held appropriately including her multiple myeloma treatment, Ozempic  and Eliquis . I marked her for potential colostomy in the preoperative area trying to avoid any abdominal creases or folds.  Findings: Upon entering the abdomen (organ space), I encountered a phlegmon involving the distal sigmoid colon and the pelvic side wall on the left with extensive scarring and fibrosis .   Procedure: The patient was taken to the operating room and placed supine. General endotracheal anesthesia was induced. Intravenous antibiotics were administered per protocol. An orogastric tube positioned to decompress the stomach. She was then placed in lithotomy with all pressure points padded and a foley catheter was placed. The abdomen and perineal/ perianal region were prepped and draped in the usual sterile fashion.  A midline incision was made and carried down to the fascia. With care the fascia was entered and a wound protector was placed. The patient was placed in slight Trendelenburg position. The small bowel and cecum were packed in to the right upper quadrant. Some minor adhesion of the small bowel down to the pelvis were taken down with scissors. The sigmoid colon was identified and upon entering the abdomen (organ space), I encountered a phlegmon involving the distal sigmoid colon and the pelvic side wall on the left with extensive scarring and fibrosis. The left ureter had a known stent in place, and the CT was up during the entire case for review. With great care the sigmoid colon was carefully dissected off the left pelvic side wall, identifying and protecting the left ureter and stent which was felt. This was done with blunt  dissection. To help with further mobilization, I identified a point on the proximal colon and came across it with a 75 mm linear cutting stapler, ensuring no ureter was involved. Then I took the mesentery down the resected colon a few inches to allow for better retraction and mobilization up into the field given the patient's body habitus and depth of the pelvis. When taking the mesentery I hugged the colon edge, taking the mesentery in small bites. This was done and allow for more blunt dissection of the left pelvic side wall and blunt dissection of the sigmoid colon and rectum from the pelvic tissue and redundant peritoneal lining. She had scarring anterior to the sigmoid colon from her prior hysterectomy. We were able to get around the rectum distally and took this with a contour TA 60 mm stapler. Again, noting that the left ureter with stent in it was lateral and safe. We then took the remaining mesentery with the ligasure coming through in layers from the inferior end upward and hugging the mesenteric boarder to the colon. After the colon was resected, we investigated our field to again verify out left ureter and ensure no bleeding. Irrigation was performed. On this inspection, we noted a blind ending tubular structure that then had peristalses.  On further investigation this was the right ureter. This had been pulled medially into the retracted scarring and fibrosis of the sigmoid colon, and was inadvertently ligated with the ligasure despite all of our precautions and careful dissection. I immediately called for Urology and  Dr. Sherrilee came straight to the OR.  See Dr. Little note for the right ureter repair with stent, and psoas hitch to the bladder. While Dr. Sherrilee was there, the left colon was mobilized further along the white line of Toldt to ensure that the left ureter was safe. He identified the ureter and stent and noted that this was deep in the field and medial to where this mobilization  was being done. I then had to take down the splenic flexure with the ligasure and mobilized the descending colon and splenic flexure.  Her mesentery was short and her abdominal wall was over 8cm deep. The distal descending colon would not reach as well as the mid descending. A linear cutting 75 mm stapler was used to take the distal 4cm and the ligasure was used at the mesenteric edge to ligate the mesentery. The left ureter again was down and protected as well as her right ureter and bladder repair.  A JP drain was placed through the right lower quadrant and placed over the bladder right ureter repair at Dr. Little request. This was secured with 3-0 Nylon suture.  The small bowel was ran and no injuries were noted and it was placed back into the abdomen in anatomic position. All laparotomy pads were removed. While retracting the fascia to the midline a circular incision was made in the skin at the location of my prior colostomy mark. I then carried this down through the subcutaneous tissue to the fascia. Again the abdominal wall was over 8cm thick, and a cruciate incision was made in the fascia and two fingerbreadths passed through. The end of the colon was brought up without any twisting or under tension.  The midline was closed with 0 PDS in the standard fashion. Marcaine  was injected. Irrigation was performed. The dead space was closed with 3-0 Vicryl suture interrupted. The skin was closed with staples and a honeycomb dressing was placed.  The colostomy was matured in the standard fashion with 3-0 Chromic gut suture. The colostomy was digitized and was patent and straight. A wafer and bag were placed.  Dr. Mavis was assisting throughout the procedure and was present for the critical portions of the case including mobilization and dissection. Dr. Sherrilee came into help with the ureter repair and then Dr. Mavis came back to help with the closure and colostomy maturation.  Final inspection  revealed acceptable hemostasis. All counts were correct at the end of the case. The patient was awakened from anesthesia and extubated without complication. Her foley was placed back to a foley bag and the JP bulb was placed. The patient went to the PACU in stable condition.    Preoperative Risks/Screening  1. Frailty Review:  Lives independently? no   Uses a mobility assist device (cane/walker/wheelchair)? yes  History of falls within 3 months? no  Cognitive impairment/dementia? no 2. Nutrition Screening:  Cancer/IBD? yes  Age >65?  yes  Weight loss >10% in past 6 months? no  If yes to any of the 3 above, consider Impact AR 3. PONV Screening:  History of PONV? no  Female under age of 69? no  History of motion sickness? no 4. Chronic pain issues? yes 5. Diabetes? no Last HgbA1c: No results found for: HGBA1C  Medications: Current Outpatient Medications  Medication Sig Dispense Refill  . acetaminophen  (TYLENOL ) 325 MG tablet Take 650 mg by mouth every 8 (eight) hours as needed for Pain    . AMIOdarone  (PACERONE ) 200 MG tablet Take 200  mg by mouth once daily    . apixaban  (ELIQUIS ) 5 mg tablet Take 5 mg by mouth 2 (two) times daily    . aspirin  81 MG EC tablet Take 1 tablet by mouth once daily    . calcitRIOL (ROCALTROL) 0.25 MCG capsule Take 0.25 mcg by mouth once daily    . cholecalciferol (VITAMIN D3) 1000 unit tablet Take 1 tablet by mouth once daily    . dapagliflozin  propanediol (FARXIGA ) 10 mg tablet Take 1 tablet by mouth once daily    . dexAMETHasone  (DECADRON ) 4 MG tablet Take 1 tablet by mouth as directed    . docusate (COLACE) 100 MG capsule Take 100 mg by mouth 2 (two) times daily    . fluticasone propionate (FLONASE) 50 mcg/actuation nasal spray Place 1 spray into both nostrils once daily    . FUROsemide  (LASIX ) 20 MG tablet Take 2 tablets by mouth 2 (two) times daily    . gabapentin  (NEURONTIN ) 300 MG capsule Take 1 capsule by mouth 3 (three) times daily    .  lidocaine -prilocaine  (EMLA ) cream Apply topically once    . losartan (COZAAR) 25 MG tablet Take 0.5 tablets by mouth once daily    . magnesium  oxide (MAG-OX) 400 mg (241.3 mg magnesium ) tablet Take 1 tablet by mouth 2 (two) times daily    . metOLazone  (ZAROXOLYN ) 2.5 MG tablet Take 1 tablet by mouth once daily    . multivitamin tablet Take 1 tablet by mouth once daily    . ondansetron  (ZOFRAN -ODT) 4 MG disintegrating tablet Take 4 mg by mouth every 6 (six) hours as needed    . oxyCODONE  (ROXICODONE ) 5 MG immediate release tablet Take 5 mg by mouth every 4 (four) hours as needed for Pain    . oxyCODONE -acetaminophen  (PERCOCET) 5-325 mg tablet Take 1-2 tablets by mouth every 12 (twelve) hours as needed for Pain (Patient not taking: Reported on 07/13/2023)    . pomalidomide  (POMALYST ) 4 mg capsule Take 1 capsule by mouth as directed (21 days on and then 7 days off)    . potassium chloride  (KLOR-CON ) 20 MEQ ER tablet Take 1 tablet by mouth 3 (three) times daily    . pregabalin  (LYRICA ) 75 MG capsule Take 75 mg by mouth once daily    . rosuvastatin  (CRESTOR ) 10 MG tablet Take 1 tablet by mouth once daily    . semaglutide  (OZEMPIC ) 1 mg/dose (4 mg/3 mL) pen injector Inject 1 mg subcutaneously once a week    . spironolactone  (ALDACTONE ) 25 MG tablet Take 0.5 tablets by mouth once daily    . tiZANidine  (ZANAFLEX ) 4 MG capsule Take 1 capsule by mouth 3 (three) times daily as needed for Muscle spasms     No current facility-administered medications for this visit.    Allergies: Allergies  Allergen Reactions  . Diclofenac  Angioedema and Swelling    Per pt, facial swelling  Per pt, facial swelling  Per pt, facial swelling    Past Medical History: Past Medical History:  Diagnosis Date  . Asthma, unspecified asthma severity, unspecified whether complicated, unspecified whether persistent (HHS-HCC)   . Chronic kidney disease (CKD), stage III (moderate) (CMS/HHS-HCC)   . Hypertension   . Multiple  myeloma in remission (CMS/HHS-HCC)   . OSA (obstructive sleep apnea)     Past Surgical History: Past Surgical History:  Procedure Laterality Date  . HYSTERECTOMY      Social History: Social History   Socioeconomic History  . Marital status: Single  Tobacco Use  .  Smoking status: Never  . Smokeless tobacco: Never  Vaping Use  . Vaping status: Never Used  Substance and Sexual Activity  . Alcohol use: Never  . Drug use: Defer  . Sexual activity: Defer   Social Drivers of Health   Financial Resource Strain: Medium Risk (08/16/2022)   [facility]   Overall Financial Resource Strain (CARDIA)   . Difficulty of Paying Living Expenses: Somewhat hard  Food Insecurity: No Food Insecurity (01/24/2023)   Received from [facility]   Hunger Vital Sign   . Within the past 12 months, you worried that your food would run out before you got the money to buy more.: Never true   . Within the past 12 months, the food you bought just didn't last and you didn't have money to get more.: Never true  Transportation Needs: No Transportation Needs (01/24/2023)   Received from [facility] - Transportation   . Lack of Transportation (Medical): No   . Lack of Transportation (Non-Medical): No    Family History: Family History  Problem Relation Name Age of Onset  . High blood pressure (Hypertension) Mother    . Heart disease Mother    . Cancer Mother    . High blood pressure (Hypertension) Father    . Heart disease Father       Objective:  Vital Signs: BP 132/76 (BP Location: Left forearm, Patient Position: Sitting, BP Cuff Size: Large Adult)   Pulse 82   Temp 36.6 C (97.9 F) (Oral)   Resp 20   Ht 157.5 cm (5' 2.01)   Wt (!) 137.8 kg (303 lb 12.7 oz)   LMP  (LMP Unknown)   SpO2 98%   BMI 55.55 kg/m   Physical Exam: Physical Exam Vitals and nursing note reviewed.  HENT:     Head: Normocephalic.     Mouth/Throat:     Mouth: Mucous membranes are moist.    Cardiovascular:     Rate and Rhythm: Normal rate and regular rhythm.  Pulmonary:     Effort: Pulmonary effort is normal.     Breath sounds: Normal breath sounds.  Abdominal:     General: Abdomen is flat.     Palpations: Abdomen is soft.   Musculoskeletal:        General: Normal range of motion.   Skin:    General: Skin is warm and dry.   Neurological:     Mental Status: She is alert and oriented to person, place, and time.   Psychiatric:        Mood and Affect: Mood normal.     Imaging: Preliminary personal review of imaging/studies below by APP and MD X-ray knee right 1 to 2 views Knee X-ray (07/13/2023): Severe bone loss in the medial compartment of the  tibia with lateral subluxation of the tibia. Extensive spurring throughout  the femur and tibia on the AP view. Large loose body in the lateral  suprapatellar pouch. Severe degenerative changes with extensive  osteophytes in the anterior and posterior femur and tibia, as well as the  patellofemoral joint. Severe global bone loss in the medial and lateral  tibia without subluxation. Severe spurring on the lateral side with  significant bone loss eroding the central tibia, medial and lateral, as  well as severe patellofemoral arthritis and a large loose body in the  posterior knee.   X-ray knee left 3 views Knee X-ray (07/13/2023): Severe bone loss in the medial compartment of the  tibia with lateral subluxation of the tibia. Extensive spurring throughout  the femur and tibia on the AP view. Large loose body in the lateral  suprapatellar pouch. Severe degenerative changes with extensive  osteophytes in the anterior and posterior femur and tibia, as well as the  patellofemoral joint. Severe global bone loss in the medial and lateral  tibia without subluxation. Severe spurring on the lateral side with  significant bone loss eroding the central tibia, medial and lateral, as  well as severe patellofemoral arthritis and a  large loose body in the  posterior knee.      Labs: No results found for: CEA No results found for: HGB, HCT, RBC, WBC, PLT No results found for: BUN, CREATININE No results found for: PTPATIENT, INR    Assessment/Plan:   ICD-10-CM   1. Colostomy in place (CMS/HHS-HCC)  Z93.3     2. Morbid obesity with BMI of 50.0-59.9, adult (CMS/HHS-HCC)  E66.01    Z68.43       Kelli Hurley is a 68 y.o. female who presents for evaluation of ostomy reversal. Assessment & Plan Today we discussed several factors are involved in the surgical decision including postop anatomy and results of most recent colonoscopy. Advised there may be other diagnostic tests/ procedures recommended by the surgery team before a decision on surgery could be made and that this was not a guarantee surgery would be recommended. Also explained that surgical intervention would not be considered until 12 months from index surgery. Possible outcomes and risks/ benefits of surgery were explained to include: 1. takedown completed with no complications; 2. if unfavorable intraoperative conditions, anastomosis with DLI and second procedure scheduled for later date; 3. if abd frozen, abort surgery and colostomy remains in place.   Colovaginal fistula Colovaginal fistula was the initial reason for the ostomy placement. Reversal surgery is complex due to potential scar tissue and anatomical challenges. - Consider ostomy reversal surgery after one year from initial surgery date. - Order colonoscopy through the stoma and rectum to assess for strictures or other issues. - Order CT scan of the abdomen and pelvis to check for hernias. - Perform gastrografin enema to evaluate healing and scar tissue in the rectum and vagina. - Discuss weight loss as a prerequisite for surgery to reduce surgical risks. - Consult with oncologist regarding holding chemotherapy treatment before and after surgery.  Ostomy status Currently  has an ostomy following surgery for colovaginal fistula. Ostomy is functioning but has caused issues due to leakage. Receiving support from an ostomy nurse practitioner.  - Continue follow-up with ostomy nurse practitioner for management and troubleshooting.  Obesity Obesity is a significant factor affecting the feasibility and risks of ostomy reversal surgery. Weight loss is recommended to reduce surgical risks and improve outcomes. Current BMI is 55; goal is to reduce to 35 or less prior to surgery. - Refer to weight loss team for non-surgical weight management options. - Discuss weight loss medication options. - Set a goal for BMI reduction to 35 or less prior to surgery.  Multiple myeloma in remission Multiple myeloma is in remission, but ongoing chemotherapy treatment may interfere with healing post-surgery. On a maintenance dose of chemotherapy.  - Consult with oncologist to discuss the feasibility of holding chemotherapy treatment for six weeks before and after potential surgery.  At this time, wound advise against pursuing ostomy reversal. All questions were answered at this time. Patient verbalizes understanding and is in agreement with the plan. There were no barriers to communication. Encouraged  to call with any questions or concerns.    Future Appointments     Date/Time Provider Department Center Visit Type   08/08/2023 1:00 PM (Arrive by 12:45 PM) [place] Alan Marsh, NP [facility] NEW BENIGN PATIENT       AMANDA MARSH QUITMAN, NP  This note has been created using automated tools and reviewed for accuracy by [facility] JOE ELTZ.

## 2023-08-14 ENCOUNTER — Other Ambulatory Visit: Payer: Self-pay | Admitting: Pharmacist

## 2023-08-14 MED ORDER — OZEMPIC (2 MG/DOSE) 8 MG/3ML ~~LOC~~ SOPN: 2.0000 mg | PEN_INJECTOR | SUBCUTANEOUS | 11 refills | Status: AC

## 2023-08-20 ENCOUNTER — Other Ambulatory Visit: Payer: Self-pay

## 2023-08-20 ENCOUNTER — Inpatient Hospital Stay: Attending: Hematology

## 2023-08-20 ENCOUNTER — Encounter (HOSPITAL_COMMUNITY): Payer: Self-pay | Admitting: Hematology

## 2023-08-20 ENCOUNTER — Other Ambulatory Visit

## 2023-08-20 DIAGNOSIS — C9002 Multiple myeloma in relapse: Secondary | ICD-10-CM | POA: Insufficient documentation

## 2023-08-20 DIAGNOSIS — Z79899 Other long term (current) drug therapy: Secondary | ICD-10-CM | POA: Diagnosis not present

## 2023-08-20 DIAGNOSIS — Z95828 Presence of other vascular implants and grafts: Secondary | ICD-10-CM

## 2023-08-20 DIAGNOSIS — C9000 Multiple myeloma not having achieved remission: Secondary | ICD-10-CM

## 2023-08-20 LAB — CBC WITH DIFFERENTIAL/PLATELET
Abs Immature Granulocytes: 0.06 K/uL (ref 0.00–0.07)
Basophils Absolute: 0.1 K/uL (ref 0.0–0.1)
Basophils Relative: 1 %
Eosinophils Absolute: 0.1 K/uL (ref 0.0–0.5)
Eosinophils Relative: 2 %
HCT: 26.5 % — ABNORMAL LOW (ref 36.0–46.0)
Hemoglobin: 8 g/dL — ABNORMAL LOW (ref 12.0–15.0)
Immature Granulocytes: 1 %
Lymphocytes Relative: 23 %
Lymphs Abs: 1.3 K/uL (ref 0.7–4.0)
MCH: 27.9 pg (ref 26.0–34.0)
MCHC: 30.2 g/dL (ref 30.0–36.0)
MCV: 92.3 fL (ref 80.0–100.0)
Monocytes Absolute: 0.7 K/uL (ref 0.1–1.0)
Monocytes Relative: 13 %
Neutro Abs: 3.4 K/uL (ref 1.7–7.7)
Neutrophils Relative %: 60 %
Platelets: 281 K/uL (ref 150–400)
RBC: 2.87 MIL/uL — ABNORMAL LOW (ref 3.87–5.11)
RDW: 17.4 % — ABNORMAL HIGH (ref 11.5–15.5)
WBC: 5.7 K/uL (ref 4.0–10.5)
nRBC: 0 % (ref 0.0–0.2)

## 2023-08-20 LAB — COMPREHENSIVE METABOLIC PANEL WITH GFR
ALT: 11 U/L (ref 0–44)
AST: 12 U/L — ABNORMAL LOW (ref 15–41)
Albumin: 3.2 g/dL — ABNORMAL LOW (ref 3.5–5.0)
Alkaline Phosphatase: 57 U/L (ref 38–126)
Anion gap: 15 (ref 5–15)
BUN: 34 mg/dL — ABNORMAL HIGH (ref 8–23)
CO2: 26 mmol/L (ref 22–32)
Calcium: 9.7 mg/dL (ref 8.9–10.3)
Chloride: 97 mmol/L — ABNORMAL LOW (ref 98–111)
Creatinine, Ser: 2.63 mg/dL — ABNORMAL HIGH (ref 0.44–1.00)
GFR, Estimated: 19 mL/min — ABNORMAL LOW (ref >60)
Glucose, Bld: 95 mg/dL (ref 70–99)
Potassium: 3.5 mmol/L (ref 3.5–5.1)
Sodium: 138 mmol/L (ref 135–145)
Total Bilirubin: 0.5 mg/dL (ref 0.0–1.2)
Total Protein: 6.9 g/dL (ref 6.5–8.1)

## 2023-08-20 LAB — SAMPLE TO BLOOD BANK

## 2023-08-20 LAB — MAGNESIUM: Magnesium: 1.8 mg/dL (ref 1.7–2.4)

## 2023-08-20 MED ORDER — HEPARIN SOD (PORK) LOCK FLUSH 100 UNIT/ML IV SOLN
500.0000 [IU] | Freq: Once | INTRAVENOUS | Status: AC
  Administered 2023-08-20: 500 [IU] via INTRAVENOUS

## 2023-08-20 MED ORDER — SODIUM CHLORIDE 0.9% FLUSH
10.0000 mL | INTRAVENOUS | Status: DC | PRN
  Administered 2023-08-20: 10 mL via INTRAVENOUS

## 2023-08-20 NOTE — Progress Notes (Signed)
 Patients port flushed without difficulty.  Good blood return noted with no bruising or swelling noted at site.  Gauze dressing applied.  Patient had BBS drawn for possible PRBC transfusion tomorrow. Patient instructed to keep blue blood bank bracelet, verbalized understanding. VSS with discharge and left in satisfactory condition with no s/s of distress noted.

## 2023-08-20 NOTE — Progress Notes (Signed)
 From: Rogers Hai, MD  Sent: 08/17/2023   1:13 PM EDT  To: Greig JINNY Fair; Farrel KANDICE Bohr, LPN  Subject: RE: Hgb 7.5                                    Can we check her Hb on Monday with blood bank sample and transfuse if needed.  Thanks  Orders placed per Dr. Charmain orders.

## 2023-08-21 ENCOUNTER — Encounter

## 2023-08-22 ENCOUNTER — Other Ambulatory Visit: Payer: Self-pay | Admitting: *Deleted

## 2023-08-22 DIAGNOSIS — G8929 Other chronic pain: Secondary | ICD-10-CM

## 2023-08-22 MED ORDER — OXYCODONE-ACETAMINOPHEN 5-325 MG PO TABS: 1.0000 | ORAL_TABLET | Freq: Two times a day (BID) | ORAL | 0 refills | Status: DC | PRN

## 2023-08-23 ENCOUNTER — Encounter (HOSPITAL_COMMUNITY): Payer: Self-pay | Admitting: Hematology

## 2023-08-30 ENCOUNTER — Ambulatory Visit (HOSPITAL_BASED_OUTPATIENT_CLINIC_OR_DEPARTMENT_OTHER): Admitting: Pulmonary Disease

## 2023-08-30 ENCOUNTER — Encounter (HOSPITAL_BASED_OUTPATIENT_CLINIC_OR_DEPARTMENT_OTHER): Payer: Self-pay | Admitting: Pulmonary Disease

## 2023-09-05 ENCOUNTER — Telehealth (HOSPITAL_BASED_OUTPATIENT_CLINIC_OR_DEPARTMENT_OTHER): Payer: Self-pay

## 2023-09-05 ENCOUNTER — Other Ambulatory Visit: Payer: Self-pay

## 2023-09-05 DIAGNOSIS — C9000 Multiple myeloma not having achieved remission: Secondary | ICD-10-CM

## 2023-09-05 MED ORDER — POMALIDOMIDE 4 MG PO CAPS
4.0000 mg | ORAL_CAPSULE | Freq: Every day | ORAL | 0 refills | Status: DC
Start: 2023-09-05 — End: 2023-10-03

## 2023-09-05 NOTE — Telephone Encounter (Signed)
 CMN received for oxygen  signed by provider and faxed confirmation received

## 2023-09-05 NOTE — Telephone Encounter (Signed)
 Chart reviewed. Pomalyst refilled per last office note with Dr. Ellin Saba.

## 2023-09-10 ENCOUNTER — Inpatient Hospital Stay: Attending: Hematology

## 2023-09-10 DIAGNOSIS — Z79899 Other long term (current) drug therapy: Secondary | ICD-10-CM | POA: Diagnosis not present

## 2023-09-10 DIAGNOSIS — R58 Hemorrhage, not elsewhere classified: Secondary | ICD-10-CM | POA: Diagnosis not present

## 2023-09-10 DIAGNOSIS — R200 Anesthesia of skin: Secondary | ICD-10-CM | POA: Insufficient documentation

## 2023-09-10 DIAGNOSIS — D500 Iron deficiency anemia secondary to blood loss (chronic): Secondary | ICD-10-CM | POA: Diagnosis not present

## 2023-09-10 DIAGNOSIS — R0602 Shortness of breath: Secondary | ICD-10-CM | POA: Diagnosis not present

## 2023-09-10 DIAGNOSIS — K909 Intestinal malabsorption, unspecified: Secondary | ICD-10-CM | POA: Insufficient documentation

## 2023-09-10 DIAGNOSIS — R202 Paresthesia of skin: Secondary | ICD-10-CM | POA: Insufficient documentation

## 2023-09-10 DIAGNOSIS — Z7901 Long term (current) use of anticoagulants: Secondary | ICD-10-CM | POA: Insufficient documentation

## 2023-09-10 DIAGNOSIS — M7989 Other specified soft tissue disorders: Secondary | ICD-10-CM | POA: Diagnosis not present

## 2023-09-10 DIAGNOSIS — D508 Other iron deficiency anemias: Secondary | ICD-10-CM

## 2023-09-10 DIAGNOSIS — C9002 Multiple myeloma in relapse: Secondary | ICD-10-CM | POA: Diagnosis present

## 2023-09-10 DIAGNOSIS — C9000 Multiple myeloma not having achieved remission: Secondary | ICD-10-CM

## 2023-09-10 DIAGNOSIS — Z95828 Presence of other vascular implants and grafts: Secondary | ICD-10-CM

## 2023-09-10 LAB — COMPREHENSIVE METABOLIC PANEL WITH GFR
ALT: 13 U/L (ref 0–44)
AST: 14 U/L — ABNORMAL LOW (ref 15–41)
Albumin: 3.2 g/dL — ABNORMAL LOW (ref 3.5–5.0)
Alkaline Phosphatase: 63 U/L (ref 38–126)
Anion gap: 14 (ref 5–15)
BUN: 53 mg/dL — ABNORMAL HIGH (ref 8–23)
CO2: 24 mmol/L (ref 22–32)
Calcium: 9 mg/dL (ref 8.9–10.3)
Chloride: 99 mmol/L (ref 98–111)
Creatinine, Ser: 2.77 mg/dL — ABNORMAL HIGH (ref 0.44–1.00)
GFR, Estimated: 18 mL/min — ABNORMAL LOW (ref >60)
Glucose, Bld: 120 mg/dL — ABNORMAL HIGH (ref 70–99)
Potassium: 3.6 mmol/L (ref 3.5–5.1)
Sodium: 137 mmol/L (ref 135–145)
Total Bilirubin: 0.6 mg/dL (ref 0.0–1.2)
Total Protein: 6.8 g/dL (ref 6.5–8.1)

## 2023-09-10 LAB — CBC WITH DIFFERENTIAL/PLATELET
Abs Granulocyte: 2.3 K/uL (ref 1.5–6.5)
Abs Immature Granulocytes: 0.3 K/uL — ABNORMAL HIGH (ref 0.00–0.07)
Basophils Absolute: 0.1 K/uL (ref 0.0–0.1)
Basophils Relative: 1 %
Eosinophils Absolute: 0.1 K/uL (ref 0.0–0.5)
Eosinophils Relative: 2 %
HCT: 26 % — ABNORMAL LOW (ref 36.0–46.0)
Hemoglobin: 8.2 g/dL — ABNORMAL LOW (ref 12.0–15.0)
Immature Granulocytes: 7 %
Lymphocytes Relative: 26 %
Lymphs Abs: 1.1 K/uL (ref 0.7–4.0)
MCH: 28.7 pg (ref 26.0–34.0)
MCHC: 31.5 g/dL (ref 30.0–36.0)
MCV: 90.9 fL (ref 80.0–100.0)
Monocytes Absolute: 0.5 K/uL (ref 0.1–1.0)
Monocytes Relative: 12 %
Neutro Abs: 2.3 K/uL (ref 1.7–7.7)
Neutrophils Relative %: 52 %
Platelets: 289 K/uL (ref 150–400)
RBC: 2.86 MIL/uL — ABNORMAL LOW (ref 3.87–5.11)
RDW: 17 % — ABNORMAL HIGH (ref 11.5–15.5)
Smear Review: NORMAL
WBC: 4.5 K/uL (ref 4.0–10.5)
nRBC: 0 % (ref 0.0–0.2)

## 2023-09-10 LAB — MAGNESIUM: Magnesium: 1.8 mg/dL (ref 1.7–2.4)

## 2023-09-10 LAB — IRON AND TIBC
Iron: 22 ug/dL — ABNORMAL LOW (ref 28–170)
Saturation Ratios: 9 % — ABNORMAL LOW (ref 10.4–31.8)
TIBC: 249 ug/dL — ABNORMAL LOW (ref 250–450)
UIBC: 227 ug/dL

## 2023-09-10 LAB — TSH: TSH: 1.84 u[IU]/mL (ref 0.350–4.500)

## 2023-09-10 LAB — FERRITIN: Ferritin: 303 ng/mL (ref 11–307)

## 2023-09-10 NOTE — Progress Notes (Signed)
 Kelli Hurley presented for Portacath access and flush. Proper placement of portacath confirmed by CXR. Portacath located left  chest wall accessed with  H 20 needle. Good blood return present. Portacath flushed with 20ml of NS, needle removed intact. Procedure without incident. Patient tolerated procedure well.  Pt discharged via wheelchair from clinic today

## 2023-09-10 NOTE — Patient Instructions (Signed)
 [facility] [place] - A DEPT OF [facility]  Discharge Instructions: Thank you for choosing [facility] to provide your oncology and hematology care.  If you have a lab appointment with the Cancer Center - please note that after April 8th, 2024, all labs will be drawn in the cancer center.  You do not have to check in or register with the main entrance as you have in the past but will complete your check-in in the cancer center.  Wear comfortable clothing and clothing appropriate for easy access to any Portacath or PICC line.   We strive to give you quality time with your provider. You may need to reschedule your appointment if you arrive late (15 or more minutes).  Arriving late affects you and other patients whose appointments are after yours.  Also, if you miss three or more appointments without notifying the office, you may be dismissed from the clinic at the provider's discretion.      For prescription refill requests, have your pharmacy contact our office and allow 72 hours for refills to be completed.    Today you received the following port flush with labs.   To help prevent nausea and vomiting after your treatment, we encourage you to take your nausea medication as directed.  BELOW ARE SYMPTOMS THAT SHOULD BE REPORTED IMMEDIATELY: *FEVER GREATER THAN 100.4 F (38 C) OR HIGHER *CHILLS OR SWEATING *NAUSEA AND VOMITING THAT IS NOT CONTROLLED WITH YOUR NAUSEA MEDICATION *UNUSUAL SHORTNESS OF BREATH *UNUSUAL BRUISING OR BLEEDING *URINARY PROBLEMS (pain or burning when urinating, or frequent urination) *BOWEL PROBLEMS (unusual diarrhea, constipation, pain near the anus) TENDERNESS IN MOUTH AND THROAT WITH OR WITHOUT PRESENCE OF ULCERS (sore throat, sores in mouth, or a toothache) UNUSUAL RASH, SWELLING OR PAIN  UNUSUAL VAGINAL DISCHARGE OR ITCHING   Items with * indicate a potential emergency and should be followed up as soon as possible or go to  the Emergency Department if any problems should occur.  Please show the CHEMOTHERAPY ALERT CARD or IMMUNOTHERAPY ALERT CARD at check-in to the Emergency Department and triage nurse.  Should you have questions after your visit or need to cancel or reschedule your appointment, please contact [facility] [place] - A DEPT OF [facility] [phone]  and follow the prompts.  Office hours are 8:00 a.m. to 4:30 p.m. Monday - Friday. Please note that voicemails left after 4:00 p.m. may not be returned until the following business day.  We are closed weekends and major holidays. You have access to a nurse at all times for urgent questions. Please call the main number to the clinic [phone] and follow the prompts.  For any non-urgent questions, you may also contact your provider using MyChart. We now offer e-Visits for anyone 6 and older to request care online for non-urgent symptoms. For details visit [email or web address].   Also download the MyChart app! Go to the app store, search "MyChart", open the app, select [place], and log in with your MyChart username and password.

## 2023-09-11 LAB — KAPPA/LAMBDA LIGHT CHAINS
Kappa free light chain: 39.5 mg/L — ABNORMAL HIGH (ref 3.3–19.4)
Kappa, lambda light chain ratio: 1.44 (ref 0.26–1.65)
Lambda free light chains: 27.4 mg/L — ABNORMAL HIGH (ref 5.7–26.3)

## 2023-09-12 LAB — PROTEIN ELECTROPHORESIS, SERUM
A/G Ratio: 1.2 (ref 0.7–1.7)
Albumin ELP: 3.2 g/dL (ref 2.9–4.4)
Alpha-1-Globulin: 0.3 g/dL (ref 0.0–0.4)
Alpha-2-Globulin: 0.8 g/dL (ref 0.4–1.0)
Beta Globulin: 0.9 g/dL (ref 0.7–1.3)
Gamma Globulin: 0.6 g/dL (ref 0.4–1.8)
Globulin, Total: 2.7 g/dL (ref 2.2–3.9)
M-Spike, %: 0.2 g/dL — ABNORMAL HIGH
Total Protein ELP: 5.9 g/dL — ABNORMAL LOW (ref 6.0–8.5)

## 2023-09-12 LAB — IMMUNOFIXATION ELECTROPHORESIS
IgA: 86 mg/dL — ABNORMAL LOW (ref 87–352)
IgG (Immunoglobin G), Serum: 668 mg/dL (ref 586–1602)
IgM (Immunoglobulin M), Srm: 39 mg/dL (ref 26–217)
Total Protein ELP: 5.9 g/dL — ABNORMAL LOW (ref 6.0–8.5)

## 2023-09-16 NOTE — Assessment & Plan Note (Addendum)
 The most likely cause of her anemia is due to chronic blood loss/malabsorption syndrome. Lab Results  Component Value Date   IRON 22 (L) 09/10/2023   TIBC 249 (L) 09/10/2023   FERRITIN 303 09/10/2023   TSAT: 9  Last colonoscopy 2021: diverticulosis, no active bleeding Endoscopy: 2011 -01/24/2023: Sigmoid colon resection for perforation and pericolonic abscess. -Last IV iron infusion in 2023.   -We discussed some of the risks, benefits, and alternatives of intravenous iron infusions. The patient is symptomatic from anemia and the iron level is critically low. She tolerated oral iron supplement poorly and desires to achieve higher levels of iron faster for adequate hematopoesis. Some of the side-effects to be expected including risks of infusion reactions, phlebitis, headaches, nausea and fatigue.  The patient is willing to proceed. Patient education material was dispensed. Goal is to keep ferritin level greater than 50 and resolution of anemia -Start oral iron every other day.  Use MiraLAX for constipation  Will repeat labs in 3 months to assess response to IV iron

## 2023-09-16 NOTE — Assessment & Plan Note (Addendum)
 Relapsed IgG kappa multiple myeloma with high risk features s/p autotransplant Oncology history as below Patient currently is on Pomalyst  3 weeks on/1 week off  -I reviewed the labs from 09/10/2023: CMP: Potassium: 3.4, creatinine: 2.34, GFR: 22, SPEP: M spike: 0.2, hemoglobin: 8.2, free kappa light chain: 39.5, free lambda light chain: 27.4, ratio: 1.44, IFE: IgG Kappa lambda chain. -Overall multiple myeloma labs are slightly worse than before.  Will continue to monitor this - Continue Pomalyst  4mg  3 weeks on/1 week off - If multiple myeloma labs continue to worsen, will consider bone marrow biopsy and discuss further steps.  Return to clinic in 3 months with labs

## 2023-09-16 NOTE — Progress Notes (Signed)
 Patient Care Team: Halbert Mariano SQUIBB, DO as PCP - General (Family Medicine) Stacia Diannah SQUIBB, MD as PCP - Cardiology (Cardiology) Darlean Ozell NOVAK, MD as Consulting Physician (Pulmonary Disease)  Clinic Day:  09/17/2023  Referring physician: Halbert Mariano SQUIBB, DO   CHIEF COMPLAINT:  CC: Relapsed IgG kappa multiple myeloma   Kelli Hurley 68 y.o. female was transferred to my care after her prior physician has left.   ASSESSMENT & PLAN:   Assessment & Plan: Kelli Hurley  is a 68 y.o. 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 -Overall multiple myeloma labs are slightly worse than before.  Will continue to monitor this - Continue Pomalyst  4mg  3 weeks on/1 week off - If multiple myeloma labs continue to worsen, will consider bone marrow biopsy and discuss further steps.  Return to clinic in 3 months with labs Iron deficiency anemia due to chronic blood loss The most likely cause of her anemia is due to chronic blood loss/malabsorption syndrome. Lab Results  Component Value Date   IRON 22 (L) 09/10/2023   TIBC 249 (L) 09/10/2023   FERRITIN 303 09/10/2023   TSAT: 9  Last colonoscopy 2021: diverticulosis, no active bleeding Endoscopy: 2011 -01/24/2023: Sigmoid colon resection for perforation and pericolonic abscess. -Last IV iron infusion in 2023.   -We discussed some of the risks, benefits, and alternatives of intravenous iron infusions. The patient is symptomatic from anemia and the iron level is  critically low. She tolerated oral iron supplement poorly and desires to achieve higher levels of iron faster for adequate hematopoesis. Some of the side-effects to be expected including risks of infusion reactions, phlebitis, headaches, nausea and fatigue.  The patient is willing to proceed. Patient education material was dispensed. Goal is to keep ferritin level greater than 50 and resolution of anemia -Start oral iron every other day.  Use MiraLAX for constipation  Will repeat labs in 3 months to assess response to IV iron    The patient understands the plans discussed today and is in agreement with them.  She knows to contact our office if she develops concerns prior to her next appointment.  120 minutes of total time was spent for this patient encounter, including preparation, face-to-face counseling with the patient and coordination of care, physical exam, and documentation of the encounter. > 50% of the time was spent on counseling as documented under my assessment and plan.    Mickiel Dry, MD  [facility]  - A DEPT OF [facility] [address] [place] [address] Dept: [phone] Dept Fax: [phone]   Orders Placed This Encounter  Procedures   CBC with Differential    Standing Status:   Future    Expected Date:   12/11/2023    Expiration Date:   03/10/2024   Comprehensive metabolic panel    Standing Status:   Future    Expected Date:   12/11/2023    Expiration Date:   03/10/2024   Kappa/lambda light chains    Standing Status:  Future    Expected Date:   12/11/2023    Expiration Date:   03/10/2024   Protein electrophoresis, serum    Standing Status:   Future    Expected Date:   12/11/2023    Expiration Date:   03/10/2024   Immunofixation electrophoresis    Standing Status:   Future    Expected Date:   12/11/2023    Expiration Date:   03/10/2024   Iron and TIBC ([facility] ONLY)    Standing  Status:   Future    Expected Date:   12/11/2023    Expiration Date:   03/10/2024   Ferritin    Standing Status:   Future    Expected Date:   12/11/2023    Expiration Date:   03/10/2024     ONCOLOGY HISTORY:   I have reviewed her chart and materials related to her cancer extensively and collaborated history with the patient. Summary of oncologic history is as follows:   IgG kappa multiple myeloma, stage II by R-ISS:  -08/03/2014: Presented with anemia, leukopenia and cryoglobulinemia -08/03/2014: M Spike: 2.7, IFE: IgG monoclonal protein with kappa light chain specificity. IgG:4157, IgM:40, IgA:38. KFLC:104.60, LFLC:11.54, Ratio:9.06. Beta 2 microglobulin: 3.7. LDH:171,Urine kappa/lamba ratio: 5.11.Ca:9.1, Cr:0.8, Hb: 8.9 -08/06/2024: Bone survey: No lytic lesions are noted in the visualized skeleton. - 09/03/2014: Bone marrow biopsy: Normocellular bone marrow for age with plasma cell neoplasm (18% of all cells). Complex karyotype -09/30/2014: PET scan: No abnormal hypermetabolism in the neck, chest, abdomen or pelvis. -10/05/2014-03/22/2015: Revlimid ,Velcade , Dexamethasone  -10/05/2014- 01/28/2020: Zometa  monthly -04/07/2015: Pre transplant evaluation:  - PET scan: No abnormal FDG uptake([facility]) -BMBx: Normocellular marrow (40%) with approximately 2-4% plasma cells.  -Labs: FKLC:45.1, FLLC:21.6, ratio:2.09. Beta 2 microglobulin: 3.27,M spike: 0.55, PQZ:fnwnronwjo IgG kappa. Normal immunoglobulin -05/07/2015: Autologous SCT [facility] Dr.Rodriguez Velinda) -09/08/2015-04/26/2016: Velcade  maintenance every 2 weeks -03/30/2016: M spike: 0.4, KFLC:22.7,LFLC:15.4,Ratio: 1.61. -04/26/2016-02/12/2019 : Ixazomib 3mg  and velcade   days 1,8,15 of 28 day cycle -11/13/2018: KFLC:43.1, LFLC:15.8,ratio:2.73. M spike:1.1 -02/10/2019: PET scan: No abnormal osseous or extraosseous hypermetabolism in the neck, chest, abdomen or pelvis.  Mild cardiomegaly. - 02/14/2019: Bone marrow biopsy: Variably cellular  bone marrow with plasma cell neoplasm.  10 to 15% of plasma cells.  Multiple myeloma FISH: +1 q., deletion 13 q., deletion 17P detected.  Normal female karyotype. -03/05/2019-08/06/2019: Carfilzomib  + pomalidomide  and dexamethasone . Carfilzomib  discontinued after 08/06/2019 due to fluid retention -08/08/2019-Current: Pomalidomide  maintenance 4mg  daily 21 days on/7 days off -03/19/2020: Bone marrow biopsy: Normocellular bone marrow with trilineage hematopoiesis and mild plasmacytosis.  Plasma cells are mildly increased on aspirate smears(4% by manual differential count), with scattered atypical large forms.  CD138 IHC on the clot and core biopsy highlight a mild increase in plasma cells with small clusters comprising 5% of overall marrow cellularity.  Normal multiple myeloma FISH panel.  Normal female karyotype - 03/22/2020: PET scan: No evidence of active malignancy.   Current Treatment:  Pomalidomide  maintenance 4mg  daily 3 weeks on/1 week off and dexamethasone  20mg  weekly  INTERVAL HISTORY:   EMMILY PELLEGRIN is here today for follow up. Patient is accompanied by niece today. Reports no complaints today. Numbness and tingling in her hands are stable. Reports some baseline SOB and swelling in legs.   Overall, is at baseline.  I have reviewed the past medical history, past surgical history, social history and family history with the patient and they are unchanged from previous note.  ALLERGIES:  is allergic to diclofenac .  MEDICATIONS:  Current Outpatient Medications  Medication Sig Dispense Refill   acetaminophen  (TYLENOL ) 650 MG CR tablet Take 650 mg by mouth every 8 (eight) hours as needed for pain.     albuterol  (VENTOLIN  HFA) 108 (90 Base) MCG/ACT inhaler Inhale 2 puffs into the lungs every 6 (six) hours as needed for wheezing or shortness of breath. 8 g 3   ASPIRIN  LOW DOSE 81 MG tablet Take 81 mg by mouth every morning.     calcitRIOL (ROCALTROL) 0.25 MCG capsule 1 capsule.      dapagliflozin  propanediol (FARXIGA ) 10 MG TABS tablet 1 tablet Orally Once a day     dexamethasone  (DECADRON ) 4 MG tablet TAKE FIVE TABLETS BY MOUTH ONCE A WEEK 30 tablet 6   docusate sodium  (COLACE) 100 MG capsule Take 1 capsule (100 mg total) by mouth 2 (two) times daily. 30 capsule 0   Elastic Bandages & Supports (MEDICAL COMPRESSION STOCKINGS) MISC as directed apply knee high compression stockings; Duration: 30 days     fluticasone (FLONASE) 50 MCG/ACT nasal spray 1 spray in each nostril Nasally Once a day; Duration: 30 day(s)     furosemide  (LASIX ) 20 MG tablet Take 40 mg by mouth 2 (two) times daily.     gabapentin  (NEURONTIN ) 300 MG capsule TAKE 1 CAPSULE BY MOUTH 3 TIMES DAILY 90 capsule 10   Infant Care Products (DERMACLOUD) OINT Apply 1 Application topically daily as needed (for skin irritation).     Lidocaine  3 % CREA 1 application as needed Externally Twice a day     lidocaine -prilocaine  (EMLA ) cream APPLY 1 QUARTER SIZED AMOUNT 1 HOUR PRIOR TO TREATMENT 30 g 10   magnesium  oxide (MAG-OX) 400 (240 Mg) MG tablet TAKE 1 TABLET BY MOUTH TWICE DAILY (Patient taking differently: Take 400 mg by mouth daily.) 60 tablet 6   methocarbamol  (ROBAXIN ) 500 MG tablet Take 1 tablet (500 mg total) by mouth every 8 (eight) hours as needed for muscle spasms. 30 tablet 0   metolazone  (ZAROXOLYN ) 2.5 MG tablet Take 1 tablet (2.5 mg total) by mouth 3 (three) times a week. Mon., Wed., and Friday 30 tablet 4   mirabegron  ER (MYRBETRIQ ) 25 MG TB24 tablet Take 1 tablet (25 mg total) by mouth daily. 30 tablet 0   Multiple Vitamin (TAB-A-VITE) TABS TAKE 1 TABLET BY MOUTH ONCE DAILY. (Patient taking differently: Take 1 tablet by mouth daily.) 30 tablet 11   ondansetron  (ZOFRAN -ODT) 4 MG disintegrating tablet Take 1 tablet (4 mg total) by mouth every 6 (six) hours as needed for nausea. 20 tablet 0   oxyCODONE  (OXY IR/ROXICODONE ) 5 MG immediate release tablet Take 1 tablet (5 mg total) by mouth every 4 (four) hours as  needed for severe pain (pain score 7-10) or breakthrough pain. 8 tablet 0   potassium chloride  SA (KLOR-CON  M) 20 MEQ tablet TAKE 1 TABLET BY MOUTH 3 TIMES DAILY 90 tablet 10   pregabalin  (LYRICA ) 75 MG capsule TAKE ONE CAPSULE BY MOUTH TWICE DAILY 60 capsule 3   rosuvastatin  (CRESTOR ) 40 MG tablet 1 tablet Orally Once a day     Semaglutide , 2 MG/DOSE, (OZEMPIC , 2 MG/DOSE,) 8 MG/3ML SOPN Inject 2 mg into the skin once a week. 3 mL 11   trolamine salicylate (ASPERCREME) 10 % cream Apply 1 application topically 2 (two) times daily as needed for muscle pain.     VITAMIN D  PO Take by mouth.     ELIQUIS  5 MG TABS tablet TAKE ONE TABLET BY MOUTH TWICE DAILY 60 tablet 5   oxyCODONE -acetaminophen  (  PERCOCET/ROXICET) 5-325 MG tablet Take 1 tablet by mouth every 12 (twelve) hours as needed for severe pain (pain score 7-10). TAKE 1 OR 2 TABLETS BY MOUTH EVERY TWELVE HOURS AS NEEDED for severe pain 60 tablet 0   pomalidomide  (POMALYST ) 4 MG capsule Take 1 capsule (4 mg total) by mouth daily for 21 days. 21 days on, 7 days off 21 capsule 0   No current facility-administered medications for this visit.   Facility-Administered Medications Ordered in Other Visits  Medication Dose Route Frequency Provider Last Rate Last Admin   heparin  lock flush 100 unit/mL  500 Units Intravenous Once Penland, Clotilda POUR, MD       sodium chloride  0.9 % injection 10 mL  10 mL Intravenous Once Penland, Clotilda POUR, MD        REVIEW OF SYSTEMS:   Constitutional: Denies fevers, chills or abnormal weight loss Eyes: Denies blurriness of vision Ears, nose, mouth, throat, and face: Denies mucositis or sore throat Respiratory: Denies cough, dyspnea or wheezes Cardiovascular: Denies palpitation, chest discomfort or lower extremity swelling Gastrointestinal:  Denies nausea, heartburn or change in bowel habits Skin: Denies abnormal skin rashes Lymphatics: Denies new lymphadenopathy or easy bruising Neurological:Denies numbness,  tingling or new weaknesses Behavioral/Psych: Mood is stable, no new changes  All other systems were reviewed with the patient and are negative.   VITALS:  Blood pressure 122/64, pulse 85, temperature (!) 97.5 F (36.4 C), temperature source Tympanic, resp. rate 20, weight 295 lb 6.7 oz (134 kg), SpO2 99%.  Wt Readings from Last 3 Encounters:  09/17/23 295 lb 6.7 oz (134 kg)  07/05/23 293 lb (132.9 kg)  06/28/23 293 lb 14 oz (133.3 kg)    Body mass index is 54.03 kg/m.  Performance status (ECOG): 2 - Symptomatic, <50% confined to bed  PHYSICAL EXAM:   GENERAL:alert, no distress and comfortable SKIN: skin color, texture, turgor are normal, no rashes or significant lesions LYMPH:  no palpable lymphadenopathy in the cervical, axillary or inguinal LUNGS: clear to auscultation and percussion with normal breathing effort HEART: regular rate & rhythm and no murmurs and no lower extremity edema ABDOMEN:abdomen soft, non-tender and normal bowel sounds Musculoskeletal:no cyanosis of digits and no clubbing  NEURO: alert & oriented x 3 with fluent speech, no focal motor/sensory deficits  LABORATORY DATA:  I have reviewed the data as listed    Component Value Date/Time   NA 141 09/21/2023 1315   K 3.4 (L) 09/21/2023 1315   CL 101 09/21/2023 1315   CO2 27 09/21/2023 1315   GLUCOSE 106 (H) 09/21/2023 1315   BUN 56 (H) 09/21/2023 1315   CREATININE 2.34 (H) 09/21/2023 1315   CALCIUM  9.7 09/21/2023 1315   CALCIUM  9.9 07/10/2022 1209   PROT 6.8 09/10/2023 1105   ALBUMIN 3.5 09/21/2023 1315   AST 14 (L) 09/10/2023 1105   ALT 13 09/10/2023 1105   ALKPHOS 63 09/10/2023 1105   BILITOT 0.6 09/10/2023 1105   GFRNONAA 22 (L) 09/21/2023 1315   GFRAA 45 (L) 11/05/2019 1230    Lab Results  Component Value Date   WBC 4.5 09/10/2023   NEUTROABS 2.3 09/10/2023   HGB 8.2 (L) 09/10/2023   HCT 26.0 (L) 09/10/2023   MCV 90.9 09/10/2023   PLT 289 09/10/2023      Chemistry      Component  Value Date/Time   NA 141 09/21/2023 1315   K 3.4 (L) 09/21/2023 1315   CL 101 09/21/2023 1315   CO2 27 09/21/2023  1315   BUN 56 (H) 09/21/2023 1315   CREATININE 2.34 (H) 09/21/2023 1315      Component Value Date/Time   CALCIUM  9.7 09/21/2023 1315   CALCIUM  9.9 07/10/2022 1209   ALKPHOS 63 09/10/2023 1105   AST 14 (L) 09/10/2023 1105   ALT 13 09/10/2023 1105   BILITOT 0.6 09/10/2023 1105      Latest Reference Range & Units 09/10/23 11:05  Iron 28 - 170 ug/dL 22 (L)  UIBC ug/dL 772  TIBC 749 - 549 ug/dL 750 (L)  Saturation Ratios 10.4 - 31.8 % 9 (L)  Ferritin 11 - 307 ng/mL 303  (L): Data is abnormally low   Latest Reference Range & Units 09/10/23 11:05  Globulin, Total 2.2 - 3.9 g/dL 2.7 (C)  A/G Ratio 0.7 - 1.7  1.2 (C)  Alpha-1-Globulin 0.0 - 0.4 g/dL 0.3  Joeyj-7-Honalopw 0.4 - 1.0 g/dL 0.8  Beta Globulin 0.7 - 1.3 g/dL 0.9  Gamma Globulin 0.4 - 1.8 g/dL 0.6  M-SPIKE, % Not Observed g/dL 0.2 (H)  SPE Interp.  Comment  Comment  Comment  IgG (Immunoglobin G), Serum 586 - 1,602 mg/dL 331  IgM (Immunoglobulin M), Srm 26 - 217 mg/dL 39  IgA 87 - 647 mg/dL 86 (L)  (H): Data is abnormally high (L): Data is abnormally low (C): Corrected   Latest Reference Range & Units 09/10/23 11:05  Kappa free light chain 3.3 - 19.4 mg/L 39.5 (H)  Lambda free light chains 5.7 - 26.3 mg/L 27.4 (H)  Kappa, lambda light chain ratio 0.26 - 1.65  1.44  Immunofixation Result, Serum  Comment ! (C)  (H): Data is abnormally high !: Data is abnormal (C): Corrected  RADIOGRAPHIC STUDIES: I have personally reviewed the radiological images as listed and agreed with the findings in the report.  None new to review

## 2023-09-17 ENCOUNTER — Inpatient Hospital Stay (HOSPITAL_BASED_OUTPATIENT_CLINIC_OR_DEPARTMENT_OTHER): Admitting: Oncology

## 2023-09-17 VITALS — BP 122/64 | HR 85 | Temp 97.5°F | Resp 20 | Wt 295.4 lb

## 2023-09-17 DIAGNOSIS — C9000 Multiple myeloma not having achieved remission: Secondary | ICD-10-CM | POA: Diagnosis not present

## 2023-09-17 DIAGNOSIS — D500 Iron deficiency anemia secondary to blood loss (chronic): Secondary | ICD-10-CM | POA: Diagnosis not present

## 2023-09-17 DIAGNOSIS — C9002 Multiple myeloma in relapse: Secondary | ICD-10-CM | POA: Diagnosis not present

## 2023-09-20 ENCOUNTER — Inpatient Hospital Stay

## 2023-09-21 ENCOUNTER — Other Ambulatory Visit (HOSPITAL_COMMUNITY)
Admission: RE | Admit: 2023-09-21 | Discharge: 2023-09-21 | Disposition: A | Discharge status: Home / Self Care | Source: Ambulatory Visit | Attending: Nephrology | Admitting: Nephrology

## 2023-09-21 DIAGNOSIS — N189 Chronic kidney disease, unspecified: Secondary | ICD-10-CM | POA: Diagnosis present

## 2023-09-21 DIAGNOSIS — N1832 Chronic kidney disease, stage 3b: Secondary | ICD-10-CM | POA: Diagnosis not present

## 2023-09-21 LAB — RENAL FUNCTION PANEL
Albumin: 3.5 g/dL (ref 3.5–5.0)
Anion gap: 13 (ref 5–15)
BUN: 56 mg/dL — ABNORMAL HIGH (ref 8–23)
CO2: 27 mmol/L (ref 22–32)
Calcium: 9.7 mg/dL (ref 8.9–10.3)
Chloride: 101 mmol/L (ref 98–111)
Creatinine, Ser: 2.34 mg/dL — ABNORMAL HIGH (ref 0.44–1.00)
GFR, Estimated: 22 mL/min — ABNORMAL LOW (ref >60)
Glucose, Bld: 106 mg/dL — ABNORMAL HIGH (ref 70–99)
Phosphorus: 2.7 mg/dL (ref 2.5–4.6)
Potassium: 3.4 mmol/L — ABNORMAL LOW (ref 3.5–5.1)
Sodium: 141 mmol/L (ref 135–145)

## 2023-09-27 ENCOUNTER — Ambulatory Visit

## 2023-09-27 VITALS — BP 110/63 | HR 75 | Temp 96.2°F | Resp 18

## 2023-09-27 DIAGNOSIS — D500 Iron deficiency anemia secondary to blood loss (chronic): Secondary | ICD-10-CM

## 2023-09-27 DIAGNOSIS — C9002 Multiple myeloma in relapse: Secondary | ICD-10-CM | POA: Diagnosis not present

## 2023-09-27 DIAGNOSIS — C9001 Multiple myeloma in remission: Secondary | ICD-10-CM

## 2023-09-27 MED ORDER — SODIUM CHLORIDE 0.9 % IV SOLN: INTRAVENOUS | Status: DC

## 2023-09-27 MED ORDER — SODIUM CHLORIDE 0.9 % IV SOLN
510.0000 mg | Freq: Once | INTRAVENOUS | Status: AC
  Administered 2023-09-27: 510 mg via INTRAVENOUS
  Filled 2023-09-27: qty 510

## 2023-09-27 NOTE — Patient Instructions (Signed)

## 2023-09-27 NOTE — Progress Notes (Signed)
 Per pt she took her premedications at home.   Patient tolerated iron infusion with no complaints voiced.  Port site clean and dry with good blood return noted before and after infusion.  Band aid applied. Pt observed for 30 minutes post iron infusion without any complications.  VSS with discharge and left in satisfactory condition with no s/s of distress noted. All follow ups as scheduled.   Kelli Hurley

## 2023-10-01 ENCOUNTER — Other Ambulatory Visit: Payer: Self-pay | Admitting: Internal Medicine

## 2023-10-01 ENCOUNTER — Other Ambulatory Visit: Payer: Self-pay

## 2023-10-01 DIAGNOSIS — G8929 Other chronic pain: Secondary | ICD-10-CM

## 2023-10-01 MED ORDER — OXYCODONE-ACETAMINOPHEN 5-325 MG PO TABS
1.0000 | ORAL_TABLET | Freq: Two times a day (BID) | ORAL | 0 refills | Status: DC | PRN
Start: 2023-10-01 — End: 2023-11-02

## 2023-10-01 NOTE — Telephone Encounter (Signed)
 Prescription refill request for Eliquis  received. Indication: A Flutter Last office visit: 01/02/23  Kelli Hurley [place] MD Scr: 2.34 on 09/21/23  Epic Age: 68 Weight: 134kg  Based on above findings Eliquis  5mg  twice daily is the appropriate dose.  Refill approved.

## 2023-10-03 ENCOUNTER — Other Ambulatory Visit: Payer: Self-pay

## 2023-10-03 DIAGNOSIS — C9000 Multiple myeloma not having achieved remission: Secondary | ICD-10-CM

## 2023-10-03 MED ORDER — POMALIDOMIDE 4 MG PO CAPS: 4.0000 mg | ORAL_CAPSULE | Freq: Every day | ORAL | 0 refills | Status: DC

## 2023-10-03 NOTE — Telephone Encounter (Signed)
 Chart reviewed. Revlimid  refilled per last office note with Dr. Davonna.

## 2023-10-04 ENCOUNTER — Ambulatory Visit

## 2023-10-04 ENCOUNTER — Inpatient Hospital Stay

## 2023-10-04 VITALS — BP 114/69 | HR 72 | Temp 97.7°F | Resp 18

## 2023-10-04 DIAGNOSIS — C9002 Multiple myeloma in relapse: Secondary | ICD-10-CM | POA: Diagnosis not present

## 2023-10-04 DIAGNOSIS — D500 Iron deficiency anemia secondary to blood loss (chronic): Secondary | ICD-10-CM

## 2023-10-04 DIAGNOSIS — C9001 Multiple myeloma in remission: Secondary | ICD-10-CM

## 2023-10-04 MED ORDER — SODIUM CHLORIDE 0.9% FLUSH: 3.0000 mL | Freq: Once | INTRAVENOUS | Status: DC | PRN

## 2023-10-04 MED ORDER — ALTEPLASE 2 MG IJ SOLR: 2.0000 mg | Freq: Once | INTRAMUSCULAR | Status: DC | PRN

## 2023-10-04 MED ORDER — SODIUM CHLORIDE 0.9 % IV SOLN: INTRAVENOUS | Status: DC

## 2023-10-04 MED ORDER — SODIUM CHLORIDE 0.9% FLUSH: 10.0000 mL | Freq: Once | INTRAVENOUS | Status: DC | PRN

## 2023-10-04 MED ORDER — HEPARIN SOD (PORK) LOCK FLUSH 100 UNIT/ML IV SOLN: 500.0000 [IU] | Freq: Once | INTRAVENOUS | Status: DC | PRN

## 2023-10-04 MED ORDER — SODIUM CHLORIDE 0.9 % IV SOLN
510.0000 mg | Freq: Once | INTRAVENOUS | Status: AC
  Administered 2023-10-04: 510 mg via INTRAVENOUS
  Filled 2023-10-04: qty 510

## 2023-10-04 MED ORDER — HEPARIN SOD (PORK) LOCK FLUSH 100 UNIT/ML IV SOLN
250.0000 [IU] | Freq: Once | INTRAVENOUS | Status: DC | PRN
Start: 2023-10-04 — End: 2023-10-04

## 2023-10-04 NOTE — Progress Notes (Signed)
Feraheme given today per MD orders. Tolerated infusion without adverse affects. Vital signs stable. No complaints at this time. Discharged from clinic by wheel chair in stable condition. Alert and oriented x 3. F/U with [facility] as scheduled.   

## 2023-10-04 NOTE — Progress Notes (Signed)
 Patient presents today for iron infusion.  Patient is in satisfactory condition with no new complaints voiced.  Vital signs are stable.  We will proceed with infusion per provider orders.    Patient took pre-meds at home prior to arrival.

## 2023-10-06 ENCOUNTER — Encounter: Payer: Self-pay | Admitting: Oncology

## 2023-10-10 ENCOUNTER — Ambulatory Visit (HOSPITAL_COMMUNITY): Attending: Family Medicine | Admitting: Speech Pathology

## 2023-10-10 DIAGNOSIS — R41841 Cognitive communication deficit: Secondary | ICD-10-CM | POA: Diagnosis present

## 2023-10-10 DIAGNOSIS — R4701 Aphasia: Secondary | ICD-10-CM | POA: Diagnosis present

## 2023-10-11 ENCOUNTER — Encounter (HOSPITAL_COMMUNITY): Payer: Self-pay | Admitting: Speech Pathology

## 2023-10-11 ENCOUNTER — Other Ambulatory Visit: Payer: Self-pay

## 2023-10-11 NOTE — Therapy (Signed)
 OUTPATIENT SPEECH LANGUAGE PATHOLOGY EVALUATION   Patient Name: Kelli Hurley MRN: [id number] DOB:28-Nov-1955, 68 y.o., female Today's Date: 10/11/2023  PCP: Halbert Mariano SQUIBB, DO REFERRING PROVIDER: Halbert Mariano SQUIBB, DO  END OF SESSION:  End of Session - 10/11/23 1529     Visit Number 1    Number of Visits 9    Date for SLP Re-Evaluation 11/14/23    Authorization Type [employer] Medicare dual complete no auth required    SLP Start Time 1015    SLP Stop Time  1100    SLP Time Calculation (min) 45 min    Activity Tolerance Patient tolerated treatment well          Past Medical History:  Diagnosis Date   Anemia    Arthritis    Asthma    Cancer (HCC)    Claustrophobia 10/05/2014   Diabetes mellitus without complication (HCC)    Dyspnea    Hypertension    Leukopenia 08/03/2014   Normocytic hypochromic anemia 08/03/2014   Renal disorder    stage 3    Sleep apnea    Past Surgical History:  Procedure Laterality Date   ABDOMINAL HYSTERECTOMY     CARDIAC SURGERY     56 months old. States she had a leaky valve.    COLECTOMY WITH COLOSTOMY CREATION/HARTMANN PROCEDURE N/A 01/24/2023   Procedure: EXPLORATORY LAPAROTOMY;  Surgeon: Kallie Manuelita JAYSON, MD;  Location: [facility];  Service: General;  Laterality: N/A;   COLON RESECTION SIGMOID  01/24/2023   Procedure: SIGMOID COLECTOMY WITH END COLOSTOMY;  Surgeon: Kallie Manuelita JAYSON, MD;  Location: [facility];  Service: General;;   COLONOSCOPY  03/26/2009   Dr. Harvey; normal colon, small internal hemorrhoids.  Recommended repeat colonoscopy in 10 years.   COLONOSCOPY WITH PROPOFOL  N/A 11/25/2019   Procedure: COLONOSCOPY WITH PROPOFOL ;  Surgeon: Cindie Carlin POUR, DO;  Location: [facility];  Service: Endoscopy;  Laterality: N/A;  10:45am   IR PATIENT EVAL TECH 0-60 MINS  09/08/2022   IR URETERAL STENT PLACEMENT EXISTING ACCESS LEFT  11/24/2022   OTHER SURGICAL HISTORY     heart surgery as infant to repair hole in heart   PORTACATH  PLACEMENT Left 02/21/2019   Procedure: INSERTION PORT-A-CATH;  Surgeon: Kallie Manuelita JAYSON, MD;  Location: [facility];  Service: General;  Laterality: Left;   Patient Active Problem List   Diagnosis Date Noted   Colostomy complication (HCC) 02/19/2023   Irritant contact dermatitis associated with fecal stoma 02/07/2023   Colostomy care (HCC) 02/07/2023   Right ureteral injury 01/27/2023   Acute postoperative anemia due to expected blood loss 01/26/2023   Acute renal failure superimposed on stage 4 chronic kidney disease (HCC) 01/26/2023   Paroxysmal atrial flutter (HCC) 01/02/2023   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   Hydronephrosis due to obstruction of ureter 09/05/2022   Lactic acidosis 09/03/2022   Hyponatremia 09/03/2022   Hypoalbuminemia due to protein-calorie malnutrition (HCC) 09/03/2022   Hypokalemia 09/03/2022   Mixed hyperlipidemia 09/03/2022   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   S/P hysterectomy with oophorectomy 08/16/2022   Vaginal discharge 08/16/2022   OSA (obstructive sleep apnea) 04/07/2021   Heart failure with preserved ejection fraction (HCC) 12/09/2020   CKD (chronic kidney disease) stage 3, GFR 30-59 ml/min (HCC) 07/28/2020   Anemia in neoplastic disease 07/28/2020   Essential hypertension 05/12/2020   Nocturnal hypoxemia 09/02/2019   Morbid obesity with BMI of 50.0-59.9, adult (HCC) 06/28/2019   SOB (shortness of breath) 06/27/2019   Goals of care, counseling/discussion  02/25/2019   H/O autologous stem cell transplant (HCC) 05/13/2015   Claustrophobia 10/05/2014   Multiple myeloma (HCC) 09/23/2014   IDA (iron deficiency anemia) 08/03/2014   Leukopenia 08/03/2014   Primary osteoarthritis of both knees 11/18/2013    ONSET DATE: 09/01/2023   REFERRING DIAG: aphasia, h/o CVA  THERAPY DIAG:  Aphasia  Cognitive communication deficit  Rationale for Evaluation and Treatment: Rehabilitation  SUBJECTIVE:   SUBJECTIVE STATEMENT: My words don't come out  right.  Pt accompanied by: self and family member  PERTINENT HISTORY: Kelli Hurley is a 68 yo female who was referred for SLE by Halbert Mariano SQUIBB, DO due to recent stroke. Pt was at [facility] Admission 7/26-7/28/25 for acute expressive aphasia 2/2 acute ischemic stroke involving left frontal lobe.  aphasia, h/o CVA  PAIN:  Are you having pain? No  FALLS: Has patient fallen in last 6 months?  No  LIVING ENVIRONMENT: Lives with: lives alone Lives in: House/apartment  PLOF:  Level of assistance: Independent with ADLs, Independent with IADLs Employment: On disability  PATIENT GOALS: Improve speech  OBJECTIVE:  Note: Objective measures were completed at Evaluation unless otherwise noted.  DIAGNOSTIC FINDINGS:  MRI: small subacute ischemic infarct involving left frontal lobe without hemorrhagic conversion. Partially empty sella concerning for degree of intracranial hypertension.  COGNITION: Overall cognitive status: Impaired Areas of impairment:  Attention: Impaired: Sustained Memory: Impaired: Short term 4/5 delayed recall of words Executive function: Impaired: Organization, Planning, Self-correction, and Slow processing Functional deficits: assist from family as needed for now, but she says she managers her medications, benefits from repetition of directions  AUDITORY COMPREHENSION: Overall auditory comprehension: Impaired: complex YES/NO questions: Appears intact Following directions: Impaired: moderately complex Conversation: Complex Interfering components: attention and processing speed Effective technique: extra processing time and repetition/stressing words  READING COMPREHENSION: Intact  EXPRESSION: verbal  VERBAL EXPRESSION: Level of generative/spontaneous verbalization: conversation Automatic speech: name: intact, social response: intact, day of week: intact, and month of year: intact  Repetition: Appears intact Naming: Responsive: 51-75%, Confrontation:  51-75%, and Divergent: 51-75% Pragmatics: Appears intact Comments: 9/15 on the [employer] short form Interfering components: attention Effective technique: phonemic cues Non-verbal means of communication: N/A  WRITTEN EXPRESSION: Dominant hand: right Written expression: Not tested  MOTOR SPEECH: Overall motor speech: Appears intact Level of impairment: N/A Respiration: WFL Phonation: normal Resonance: WFL Articulation: Appears intact Intelligibility: Intelligibility reduced (paraphasic errors noted) Motor planning: appears WNL, will assess apraxia further Motor speech errors: N/A Interfering components: N/A Effective technique: N/A  ORAL MOTOR EXAMINATION: Overall status: WFL Comments:   STANDARDIZED ASSESSMENTS: SLUMS: 17/30 and BNT short form: 9/15                                                                                                                       TREATMENT DATE: 10/10/2023 Evaluation only completed this date. Pt and caregiver were given word finding strategies and communication support template to fill out for homework and bring back next session.  PATIENT EDUCATION: Education details: Given folder with word finding strategies and communication support template Person educated: Patient Education method: Explanation, Demonstration, Verbal cues, and Handouts Education comprehension: verbalized understanding   GOALS: Goals reviewed with patient? Yes  SHORT TERM GOALS: Target date: 11/14/2023  Pt will implement memory strategies in functional therapy activities with 90% acc with mi/mod cues. Baseline: 80% Goal status: INITIAL  2.  Pt will increase naming of common objects/pictures to 90% acc when provided with min multimodality cues Baseline: ~75% medium frequency words Goal status: INITIAL  3.  Pt will implement word-finding strategies with 90% accuracy when unable to verbalize desired word in conversation/functional tasks with mi/mod  assist.  Baseline: mod assist Goal status: INITIAL  4.  Pt will describe objects and pictures by providing at least three salient features as judged by clinician with 90% acc when provided mi/mod cues.  Baseline: 75% acc Goal status: INITIAL  5.  Pt will increase divergent naming to 12+ items per concrete category with min cues.  Baseline: ~8 Goal status: INITIAL  6.  Pt will follow multistep auditory directions with 90% acc when provided mi/mod cues. Baseline:  Goal status: INITIAL  LONG TERM GOALS: Target date: 01/14/2024  Pt will communicate moderate level wants/needs/thoughts to family and friends with use of multimodality communication strategies as needed. Baseline: Min/Mod assist Goal status: INITIAL  ASSESSMENT:  CLINICAL IMPRESSION: (from initial evaluation 10/10/2023) Patient is a 68 y.o. female who was seen today for a cognitive linguistic evaluation. Pt presents with mild/mod expressive aphasia, mild receptive aphasia, and mild cognitive deficits (memory and attention) negatively impacting functional communication and managing complex higher level executive functioning tasks. Pt is generally aware of her speech deficits and expresses frustration about difficulty communicating what she wants to say. Pt scored a 17/30 on the [facility] with errors in mental calculation, attention, memory, and clock drawing. She scored a 9/15 on the [employer] short form and exhibited phonemic paraphasias. In conversation, she displayed the same paraphasic errors especially with multi-syllabic words. She achieved 10/10 for fluency and content in the picture description task and was able to repeat sentences with 100% acc. Recommend SLP therapy to address the above stated deficits and increase independence with communication. Pt is highly motivated to improve her speech.   OBJECTIVE IMPAIRMENTS: include attention, memory, executive functioning, expressive language, receptive language, and  aphasia. These impairments are limiting patient from managing appointments, managing finances, and effectively communicating at home and in community. Factors affecting potential to achieve goals and functional outcome are family/community support. Patient will benefit from skilled SLP services to address above impairments and improve overall function.  REHAB POTENTIAL: Excellent  PLAN:  SLP FREQUENCY: 2x/week  SLP DURATION: 8 weeks  PLANNED INTERVENTIONS: Cueing hierachy, Cognitive reorganization, Internal/external aids, Functional tasks, Multimodal communication approach, SLP instruction and feedback, Compensatory strategies, Patient/family education, [phone] Treatment of speech (30 or 45 min) , and [address]- Speech [address] Artic, Phon, Eval Compre, Express   Thank you,  Lamar Candy, CCC-SLP [phone]  Late entry for 10/10/2023 Donavyn Fecher, CCC-SLP 10/11/2023, 3:31 PM

## 2023-10-17 ENCOUNTER — Other Ambulatory Visit: Payer: Self-pay | Admitting: *Deleted

## 2023-10-17 MED ORDER — PREGABALIN 75 MG PO CAPS: 75.0000 mg | ORAL_CAPSULE | Freq: Two times a day (BID) | ORAL | 3 refills | Status: AC

## 2023-10-31 ENCOUNTER — Encounter (HOSPITAL_COMMUNITY): Payer: Self-pay | Admitting: Speech Pathology

## 2023-10-31 ENCOUNTER — Ambulatory Visit (HOSPITAL_COMMUNITY): Admitting: Speech Pathology

## 2023-10-31 ENCOUNTER — Other Ambulatory Visit: Payer: Self-pay

## 2023-10-31 DIAGNOSIS — C9000 Multiple myeloma not having achieved remission: Secondary | ICD-10-CM

## 2023-10-31 DIAGNOSIS — R41841 Cognitive communication deficit: Secondary | ICD-10-CM

## 2023-10-31 DIAGNOSIS — R4701 Aphasia: Secondary | ICD-10-CM

## 2023-10-31 MED ORDER — POMALIDOMIDE 4 MG PO CAPS: 4.0000 mg | ORAL_CAPSULE | Freq: Every day | ORAL | 0 refills | Status: DC

## 2023-10-31 NOTE — Telephone Encounter (Signed)
 Chart reviewed. Revlimid  refilled per last office note with Dr. Davonna.

## 2023-10-31 NOTE — Therapy (Signed)
 OUTPATIENT SPEECH LANGUAGE PATHOLOGY TREATMENT   Patient Name: Kelli Hurley MRN: [id number] DOB:04-Aug-1955, 68 y.o., female Today's Date: 10/31/2023  PCP: Halbert Mariano SQUIBB, DO REFERRING PROVIDER: Halbert Mariano SQUIBB, DO  END OF SESSION:  End of Session - 10/31/23 0941     Visit Number 2    Number of Visits 9    Date for Recertification  11/29/23    Authorization Type UHC Medicare dual complete no auth required    SLP Start Time 0930    SLP Stop Time  1015    SLP Time Calculation (min) 45 min    Activity Tolerance Patient tolerated treatment well          Past Medical History:  Diagnosis Date   Anemia    Arthritis    Asthma    Cancer (HCC)    Claustrophobia 10/05/2014   Diabetes mellitus without complication (HCC)    Dyspnea    Hypertension    Leukopenia 08/03/2014   Normocytic hypochromic anemia 08/03/2014   Renal disorder    stage 3    Sleep apnea    Past Surgical History:  Procedure Laterality Date   ABDOMINAL HYSTERECTOMY     CARDIAC SURGERY     28 months old. States she had a leaky valve.    COLECTOMY WITH COLOSTOMY CREATION/HARTMANN PROCEDURE N/A 01/24/2023   Procedure: EXPLORATORY LAPAROTOMY;  Surgeon: Kallie Manuelita JAYSON, MD;  Location: [facility];  Service: General;  Laterality: N/A;   COLON RESECTION SIGMOID  01/24/2023   Procedure: SIGMOID COLECTOMY WITH END COLOSTOMY;  Surgeon: Kallie Manuelita JAYSON, MD;  Location: [facility];  Service: General;;   COLONOSCOPY  03/26/2009   Dr. Harvey; normal colon, small internal hemorrhoids.  Recommended repeat colonoscopy in 10 years.   COLONOSCOPY WITH PROPOFOL  N/A 11/25/2019   Procedure: COLONOSCOPY WITH PROPOFOL ;  Surgeon: Cindie Carlin POUR, DO;  [facility];  Service: Endoscopy;  Laterality: N/A;  10:45am   IR PATIENT EVAL TECH 0-60 MINS  09/08/2022   IR URETERAL STENT PLACEMENT EXISTING ACCESS LEFT  11/24/2022   OTHER SURGICAL HISTORY     heart surgery as infant to repair hole in heart   PORTACATH  PLACEMENT Left 02/21/2019   Procedure: INSERTION PORT-A-CATH;  Surgeon: Kallie Manuelita JAYSON, MD;  Location: [facility];  Service: General;  Laterality: Left;   Patient Active Problem List   Diagnosis Date Noted   Colostomy complication (HCC) 02/19/2023   Irritant contact dermatitis associated with fecal stoma 02/07/2023   Colostomy care (HCC) 02/07/2023   Right ureteral injury 01/27/2023   Acute postoperative anemia due to expected blood loss 01/26/2023   Acute renal failure superimposed on stage 4 chronic kidney disease (HCC) 01/26/2023   Paroxysmal atrial flutter (HCC) 01/02/2023   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   Hydronephrosis due to obstruction of ureter 09/05/2022   Lactic acidosis 09/03/2022   Hyponatremia 09/03/2022   Hypoalbuminemia due to protein-calorie malnutrition 09/03/2022   Hypokalemia 09/03/2022   Mixed hyperlipidemia 09/03/2022   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   S/P hysterectomy with oophorectomy 08/16/2022   Vaginal discharge 08/16/2022   OSA (obstructive sleep apnea) 04/07/2021   Heart failure with preserved ejection fraction (HCC) 12/09/2020   CKD (chronic kidney disease) stage 3, GFR 30-59 ml/min (HCC) 07/28/2020   Anemia in neoplastic disease 07/28/2020   Essential hypertension 05/12/2020   Nocturnal hypoxemia 09/02/2019   Morbid obesity with BMI of 50.0-59.9, adult (HCC) 06/28/2019   SOB (shortness of breath) 06/27/2019   Goals of care, counseling/discussion 02/25/2019  H/O autologous stem cell transplant (HCC) 05/13/2015   Claustrophobia 10/05/2014   Multiple myeloma (HCC) 09/23/2014   IDA (iron deficiency anemia) 08/03/2014   Leukopenia 08/03/2014   Primary osteoarthritis of both knees 11/18/2013    ONSET DATE: 09/01/2023   REFERRING DIAG: aphasia, h/o CVA  THERAPY DIAG:  Aphasia  Cognitive communication deficit  Rationale for Evaluation and Treatment: Rehabilitation  SUBJECTIVE:   SUBJECTIVE STATEMENT: My words don't come out  right.  Pt accompanied by: self and family member  PERTINENT HISTORY: Kelli Hurley is a 68 yo female who was referred for SLE by Halbert Mariano SQUIBB, DO due to recent stroke. Pt was at [facility]mission 7/26-7/28/25 for acute expressive aphasia 2/2 acute ischemic stroke involving left frontal lobe.  aphasia, h/o CVA  PAIN:  Are you having pain? No  FALLS: Has patient fallen in last 6 months?  No  LIVING ENVIRONMENT: Lives with: lives alone Lives in: House/apartment  PLOF:  Level of assistance: Independent with ADLs, Independent with IADLs Employment: On disability  PATIENT GOALS: Improve speech  OBJECTIVE:  Note: Objective measures were completed at Evaluation unless otherwise noted.  DIAGNOSTIC FINDINGS:  MRI: small subacute ischemic infarct involving left frontal lobe without hemorrhagic conversion. Partially empty sella concerning for degree of intracranial hypertension.  COGNITION: Overall cognitive status: Impaired Areas of impairment:  Attention: Impaired: Sustained Memory: Impaired: Short term 4/5 delayed recall of words Executive function: Impaired: Organization, Planning, Self-correction, and Slow processing Functional deficits: assist from family as needed for now, but she says she managers her medications, benefits from repetition of directions  AUDITORY COMPREHENSION: Overall auditory comprehension: Impaired: complex YES/NO questions: Appears intact Following directions: Impaired: moderately complex Conversation: Complex Interfering components: attention and processing speed Effective technique: extra processing time and repetition/stressing words  READING COMPREHENSION: Intact  EXPRESSION: verbal  VERBAL EXPRESSION: Level of generative/spontaneous verbalization: conversation Automatic speech: name: intact, social response: intact, day of week: intact, and month of year: intact  Repetition: Appears intact Naming: Responsive: 51-75%, Confrontation:  51-75%, and Divergent: 51-75% Pragmatics: Appears intact Comments: 9/15 on the [employer] short form Interfering components: attention Effective technique: phonemic cues Non-verbal means of communication: N/A  WRITTEN EXPRESSION: Dominant hand: right Written expression: Not tested  MOTOR SPEECH: Overall motor speech: Appears intact Level of impairment: N/A Respiration: WFL Phonation: normal Resonance: WFL Articulation: Appears intact Intelligibility: Intelligibility reduced (paraphasic errors noted) Motor planning: appears WNL, will assess apraxia further Motor speech errors: N/A Interfering components: N/A Effective technique: N/A  ORAL MOTOR EXAMINATION: Overall status: WFL Comments:   STANDARDIZED ASSESSMENTS: SLUMS: 17/30 and BNT short form: 9/15                                                                                                                       TREATMENT DATE: 10/10/2023 Evaluation only completed this date. Pt and caregiver were given word finding strategies and communication support template to fill out for homework and bring back next session.    PATIENT EDUCATION: Education  details: Given folder with word finding strategies and communication support template Person educated: Patient Education method: Explanation, Demonstration, Verbal cues, and Handouts Education comprehension: verbalized understanding   GOALS: Goals reviewed with patient? Yes  SHORT TERM GOALS: Target date: 11/14/2023  Pt will implement memory strategies in functional therapy activities with 90% acc with mi/mod cues. Baseline: 80% Goal status: INITIAL  2.  Pt will increase naming of common objects/pictures to 90% acc when provided with min multimodality cues Baseline: ~75% medium frequency words Goal status: INITIAL  3.  Pt will implement word-finding strategies with 90% accuracy when unable to verbalize desired word in conversation/functional tasks with mi/mod  assist.  Baseline: mod assist Goal status: INITIAL  4.  Pt will describe objects and pictures by providing at least three salient features as judged by clinician with 90% acc when provided mi/mod cues.  Baseline: 75% acc Goal status: INITIAL  5.  Pt will increase divergent naming to 12+ items per concrete category with min cues.  Baseline: ~8 Goal status: INITIAL  6.  Pt will follow multistep auditory directions with 90% acc when provided mi/mod cues. Baseline:  Goal status: INITIAL  LONG TERM GOALS: Target date: 01/14/2024  Pt will communicate moderate level wants/needs/thoughts to family and friends with use of multimodality communication strategies as needed. Baseline: Min/Mod assist Goal status: INITIAL  ASSESSMENT:  CLINICAL IMPRESSION: (from initial evaluation 10/10/2023) Patient is a 68 y.o. female who was seen today for a cognitive linguistic evaluation. Pt presents with mild/mod expressive aphasia, mild receptive aphasia, and mild cognitive deficits (memory and attention) negatively impacting functional communication and managing complex higher level executive functioning tasks. Pt is generally aware of her speech deficits and expresses frustration about difficulty communicating what she wants to say. Pt scored a 17/30 on the [facility] with errors in mental calculation, attention, memory, and clock drawing. She scored a 9/15 on the [employer] short form and exhibited phonemic paraphasias. In conversation, she displayed the same paraphasic errors especially with multi-syllabic words. She achieved 10/10 for fluency and content in the picture description task and was able to repeat sentences with 100% acc. Recommend SLP therapy to address the above stated deficits and increase independence with communication. Pt is highly motivated to improve her speech.   OBJECTIVE IMPAIRMENTS: include attention, memory, executive functioning, expressive language, receptive language, and  aphasia. These impairments are limiting patient from managing appointments, managing finances, and effectively communicating at home and in community. Factors affecting potential to achieve goals and functional outcome are family/community support. Patient will benefit from skilled SLP services to address above impairments and improve overall function.  REHAB POTENTIAL: Excellent  PLAN:  SLP FREQUENCY: 2x/week  SLP DURATION: 8 weeks  PLANNED INTERVENTIONS: Cueing hierachy, Cognitive reorganization, Internal/external aids, Functional tasks, Multimodal communication approach, SLP instruction and feedback, Compensatory strategies, Patient/family education, [phone] Treatment of speech (30 or 45 min) , and [address]- Speech [address], Artic, Phon, Eval Compre, Express   Thank you,  Lamar Candy, CCC-SLP [phone]   Audri Kozub, CCC-SLP 10/31/2023, 9:42 AM

## 2023-11-02 ENCOUNTER — Other Ambulatory Visit: Payer: Self-pay

## 2023-11-02 DIAGNOSIS — G8929 Other chronic pain: Secondary | ICD-10-CM

## 2023-11-02 MED ORDER — OXYCODONE-ACETAMINOPHEN 5-325 MG PO TABS: 1.0000 | ORAL_TABLET | Freq: Two times a day (BID) | ORAL | 0 refills | Status: DC | PRN

## 2023-11-05 ENCOUNTER — Ambulatory Visit (HOSPITAL_COMMUNITY): Admitting: Speech Pathology

## 2023-11-05 ENCOUNTER — Encounter (HOSPITAL_COMMUNITY): Payer: Self-pay | Admitting: Speech Pathology

## 2023-11-05 DIAGNOSIS — R41841 Cognitive communication deficit: Secondary | ICD-10-CM

## 2023-11-05 DIAGNOSIS — R4701 Aphasia: Secondary | ICD-10-CM | POA: Diagnosis not present

## 2023-11-05 NOTE — Therapy (Signed)
 OUTPATIENT SPEECH LANGUAGE PATHOLOGY TREATMENT   Patient Name: Kelli Hurley MRN: [id number] DOB:1955-07-10, 68 y.o., female Today's Date: 11/05/2023  PCP: Halbert Mariano SQUIBB, DO REFERRING PROVIDER: Halbert Mariano SQUIBB, DO  END OF SESSION:  End of Session - 11/05/23 1004     Visit Number 3    Number of Visits 9    Date for Recertification  11/29/23    Authorization Type U[employer] Medicare dual complete no auth required    SLP Start Time 0945    SLP Stop Time  1030    SLP Time Calculation (min) 45 min    Activity Tolerance Patient tolerated treatment well          Past Medical History:  Diagnosis Date   Anemia    Arthritis    Asthma    Cancer (HCC)    Claustrophobia 10/05/2014   Diabetes mellitus without complication (HCC)    Dyspnea    Hypertension    Leukopenia 08/03/2014   Normocytic hypochromic anemia 08/03/2014   Renal disorder    stage 3    Sleep apnea    Past Surgical History:  Procedure Laterality Date   ABDOMINAL HYSTERECTOMY     CARDIAC SURGERY     29 months old. States she had a leaky valve.    COLECTOMY WITH COLOSTOMY CREATION/HARTMANN PROCEDURE N/A 01/24/2023   Procedure: EXPLORATORY LAPAROTOMY;  Surgeon: Kallie Manuelita JAYSON, MD;  Location: [facility];  Service: General;  Laterality: N/A;   COLON RESECTION SIGMOID  01/24/2023   Procedure: SIGMOID COLECTOMY WITH END COLOSTOMY;  Surgeon: Kallie Manuelita JAYSON, MD;  Location: [facility];  Service: General;;   COLONOSCOPY  03/26/2009   Dr. Harvey; normal colon, small internal hemorrhoids.  Recommended repeat colonoscopy in 10 years.   COLONOSCOPY WITH PROPOFOL  N/A 11/25/2019   Procedure: COLONOSCOPY WITH PROPOFOL ;  Surgeon: Cindie Carlin POUR, DO;  Location: [facility];  Service: Endoscopy;  Laterality: N/A;  10:45am   IR PATIENT EVAL TECH 0-60 MINS  09/08/2022   IR URETERAL STENT PLACEMENT EXISTING ACCESS LEFT  11/24/2022   OTHER SURGICAL HISTORY     heart surgery as infant to repair hole in heart   PORTACATH  PLACEMENT Left 02/21/2019   Procedure: INSERTION PORT-A-CATH;  Surgeon: Kallie Manuelita JAYSON, MD;  Location: [facility];  Service: General;  Laterality: Left;   Patient Active Problem List   Diagnosis Date Noted   Colostomy complication (HCC) 02/19/2023   Irritant contact dermatitis associated with fecal stoma 02/07/2023   Colostomy care (HCC) 02/07/2023   Right ureteral injury 01/27/2023   Acute postoperative anemia due to expected blood loss 01/26/2023   Acute renal failure superimposed on stage 4 chronic kidney disease (HCC) 01/26/2023   Paroxysmal atrial flutter (HCC) 01/02/2023   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   Hydronephrosis due to obstruction of ureter 09/05/2022   Lactic acidosis 09/03/2022   Hyponatremia 09/03/2022   Hypoalbuminemia due to protein-calorie malnutrition 09/03/2022   Hypokalemia 09/03/2022   Mixed hyperlipidemia 09/03/2022   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   S/P hysterectomy with oophorectomy 08/16/2022   Vaginal discharge 08/16/2022   OSA (obstructive sleep apnea) 04/07/2021   Heart failure with preserved ejection fraction (HCC) 12/09/2020   CKD (chronic kidney disease) stage 3, GFR 30-59 ml/min (HCC) 07/28/2020   Anemia in neoplastic disease 07/28/2020   Essential hypertension 05/12/2020   Nocturnal hypoxemia 09/02/2019   Morbid obesity with BMI of 50.0-59.9, adult (HCC) 06/28/2019   SOB (shortness of breath) 06/27/2019   Goals of care, counseling/discussion 02/25/2019  H/O autologous stem cell transplant (HCC) 05/13/2015   Claustrophobia 10/05/2014   Multiple myeloma (HCC) 09/23/2014   IDA (iron deficiency anemia) 08/03/2014   Leukopenia 08/03/2014   Primary osteoarthritis of both knees 11/18/2013    ONSET DATE: 09/01/2023   REFERRING DIAG: aphasia, h/o CVA  THERAPY DIAG:  Aphasia  Cognitive communication deficit  Rationale for Evaluation and Treatment: Rehabilitation  SUBJECTIVE:   SUBJECTIVE STATEMENT: When I get excited. (Has more  trouble with speech)  Pt accompanied by: self and family member  PERTINENT HISTORY: Kelli Hurley is a 68 yo female who was referred for SLE by Halbert Mariano SQUIBB, DO due to recent stroke. Pt was at [facility] Admission 7/26-7/28/25 for acute expressive aphasia 2/2 acute ischemic stroke involving left frontal lobe.  aphasia, h/o CVA  PAIN:  Are you having pain? No  FALLS: Has patient fallen in last 6 months?  No  LIVING ENVIRONMENT: Lives with: lives alone Lives in: House/apartment  PLOF:  Level of assistance: Independent with ADLs, Independent with IADLs Employment: On disability  PATIENT GOALS: Improve speech  OBJECTIVE:  Note: Objective measures were completed at Evaluation unless otherwise noted.  DIAGNOSTIC FINDINGS:  MRI: small subacute ischemic infarct involving left frontal lobe without hemorrhagic conversion. Partially empty sella concerning for degree of intracranial hypertension.   STANDARDIZED ASSESSMENTS: SLUMS: 17/30 and BNT short form: 9/15                                                                                                                       TREATMENT DATE: 10/31/2023; Pt was accompanied to therapy by her niece. She filled out a significant portion of her communication template that SLP provided during the initial evaluation. Pt with minor spelling errors for family members, but otherwise legible. In session, she completed divergent naming task to list TV shows she enjoys and she completed with 90% acc with min cues. She required greater assistance to name members of her medical team, but was then able to locate and identify names of the team when using the communication log (who is your cardiologist?). Pt noted to have minor hesitations in speech, but generally able to verbalize what she wants to say with use of word finding strategies (describe by function etc). Pt to add additional information to template for HEP. Continue to target goals.   Current  Treatment: 11/05/23 Pt accompanied to therapy by her sister. SLP shared goals of therapy and word finding strategies with her sister. SLP facilitated targeted verbal responses by assisting filling in additional items on her communication template (speech strategies, food items, travel, and hobbies). Pt verbalized (divergent naming) favorite food items eaten after church and for a grocery list with 100% acc when allowed extra time and indirect cues. She shared her Sunday church routine including accurate times of each event with min cues from her sister. Speech was fluent and minimal word finding difficulties noted today. She indicates that she has greater trouble verbalizing when she gets excited. Pt encouraged to  review the communication template out loud at home daily and continue to target goals.    PATIENT EDUCATION: Education details: HEP provided to facilitate verbal expression progress at home Person educated: Patient Education method: Explanation, Demonstration, Verbal cues, and Handouts Education comprehension: verbalized understanding   GOALS: Goals reviewed with patient? Yes  SHORT TERM GOALS: Target date: 11/14/2023  Pt will implement memory strategies in functional therapy activities with 90% acc with mi/mod cues. Baseline: 80% Goal status: ONGOING  2.  Pt will increase naming of common objects/pictures to 90% acc when provided with min multimodality cues Baseline: ~75% medium frequency words Goal status: ONGOING  3.  Pt will implement word-finding strategies with 90% accuracy when unable to verbalize desired word in conversation/functional tasks with mi/mod assist.  Baseline: mod assist Goal status: ONGOING  4.  Pt will describe objects and pictures by providing at least three salient features as judged by clinician with 90% acc when provided mi/mod cues.  Baseline: 75% acc Goal status: ONGOING  5.  Pt will increase divergent naming to 12+ items per concrete category  with min cues.  Baseline: ~8 Goal status: ONGOING  6.  Pt will follow multistep auditory directions with 90% acc when provided mi/mod cues. Baseline:  Goal status: ONGOING  LONG TERM GOALS: Target date: 01/14/2024  Pt will communicate moderate level wants/needs/thoughts to family and friends with use of multimodality communication strategies as needed. Baseline: Min/Mod assist Goal status: ONGOING  ASSESSMENT:  CLINICAL IMPRESSION: (from initial evaluation 10/10/2023) Patient is a 68 y.o. female who was seen today for a cognitive linguistic evaluation. Pt presents with mild/mod expressive aphasia, mild receptive aphasia, and mild cognitive deficits (memory and attention) negatively impacting functional communication and managing complex higher level executive functioning tasks. Pt is generally aware of her speech deficits and expresses frustration about difficulty communicating what she wants to say. Pt scored a 17/30 on the [facility] with errors in mental calculation, attention, memory, and clock drawing. She scored a 9/15 on the [employer] short form and exhibited phonemic paraphasias. In conversation, she displayed the same paraphasic errors especially with multi-syllabic words. She achieved 10/10 for fluency and content in the picture description task and was able to repeat sentences with 100% acc. Recommend SLP therapy to address the above stated deficits and increase independence with communication. Pt is highly motivated to improve her speech.   OBJECTIVE IMPAIRMENTS: include attention, memory, executive functioning, expressive language, receptive language, and aphasia. These impairments are limiting patient from managing appointments, managing finances, and effectively communicating at home and in community. Factors affecting potential to achieve goals and functional outcome are family/community support. Patient will benefit from skilled SLP services to address above impairments and  improve overall function.  REHAB POTENTIAL: Excellent  PLAN:  SLP FREQUENCY: 2x/week  SLP DURATION: 8 weeks  PLANNED INTERVENTIONS: Cueing hierachy, Cognitive reorganization, Internal/external aids, Functional tasks, Multimodal communication approach, SLP instruction and feedback, Compensatory strategies, Patient/family education, [phone] Treatment of speech (30 or 45 min) , and [address]- Speech [address] Artic, Phon, Eval Compre, Express   Thank you,  Lamar Candy, CCC-SLP [phone]   Pattye Meda, CCC-SLP 11/05/2023, 10:09 AM

## 2023-11-08 ENCOUNTER — Ambulatory Visit (HOSPITAL_COMMUNITY): Attending: Family Medicine | Admitting: Speech Pathology

## 2023-11-08 ENCOUNTER — Encounter (HOSPITAL_COMMUNITY): Payer: Self-pay | Admitting: Speech Pathology

## 2023-11-08 DIAGNOSIS — R4701 Aphasia: Secondary | ICD-10-CM | POA: Diagnosis present

## 2023-11-08 DIAGNOSIS — R41841 Cognitive communication deficit: Secondary | ICD-10-CM | POA: Insufficient documentation

## 2023-11-08 NOTE — Therapy (Signed)
 OUTPATIENT SPEECH LANGUAGE PATHOLOGY TREATMENT   Patient Name: Kelli Hurley MRN: [id number] DOB:1955-04-22, 68 y.o., female Today's Date: 11/08/2023  PCP: Halbert Mariano SQUIBB, DO REFERRING PROVIDER: Halbert Mariano SQUIBB, DO  END OF SESSION:  End of Session - 11/08/23 1047     Visit Number 4    Number of Visits 9    Date for Recertification  11/29/23    Authorization Type UHC Medicare dual complete no auth required    SLP Start Time 1030    SLP Stop Time  1115    SLP Time Calculation (min) 45 min    Activity Tolerance Patient tolerated treatment well          Past Medical History:  Diagnosis Date   Anemia    Arthritis    Asthma    Cancer (HCC)    Claustrophobia 10/05/2014   Diabetes mellitus without complication (HCC)    Dyspnea    Hypertension    Leukopenia 08/03/2014   Normocytic hypochromic anemia 08/03/2014   Renal disorder    stage 3    Sleep apnea    Past Surgical History:  Procedure Laterality Date   ABDOMINAL HYSTERECTOMY     CARDIAC SURGERY     58 months old. States she had a leaky valve.    COLECTOMY WITH COLOSTOMY CREATION/HARTMANN PROCEDURE N/A 01/24/2023   Procedure: EXPLORATORY LAPAROTOMY;  Surgeon: Kallie Manuelita JAYSON, MD;  Location: [facility];  Service: General;  Laterality: N/A;   COLON RESECTION SIGMOID  01/24/2023   Procedure: SIGMOID COLECTOMY WITH END COLOSTOMY;  Surgeon: Kallie Manuelita JAYSON, MD;  Location: [facility];  Service: General;;   COLONOSCOPY  03/26/2009   Dr. Harvey; normal colon, small internal hemorrhoids.  Recommended repeat colonoscopy in 10 years.   COLONOSCOPY WITH PROPOFOL  N/A 11/25/2019   Procedure: COLONOSCOPY WITH PROPOFOL ;  Surgeon: Cindie Carlin POUR, DO;  Location: [facility];  Service: Endoscopy;  Laterality: N/A;  10:45am   IR PATIENT EVAL TECH 0-60 MINS  09/08/2022   IR URETERAL STENT PLACEMENT EXISTING ACCESS LEFT  11/24/2022   OTHER SURGICAL HISTORY     heart surgery as infant to repair hole in heart   PORTACATH  PLACEMENT Left 02/21/2019   Procedure: INSERTION PORT-A-CATH;  Surgeon: Kallie Manuelita JAYSON, MD;  Location: [facility];  Service: General;  Laterality: Left;   Patient Active Problem List   Diagnosis Date Noted   Colostomy complication (HCC) 02/19/2023   Irritant contact dermatitis associated with fecal stoma 02/07/2023   Colostomy care (HCC) 02/07/2023   Right ureteral injury 01/27/2023   Acute postoperative anemia due to expected blood loss 01/26/2023   Acute renal failure superimposed on stage 4 chronic kidney disease (HCC) 01/26/2023   Paroxysmal atrial flutter (HCC) 01/02/2023   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   Hydronephrosis due to obstruction of ureter 09/05/2022   Lactic acidosis 09/03/2022   Hyponatremia 09/03/2022   Hypoalbuminemia due to protein-calorie malnutrition 09/03/2022   Hypokalemia 09/03/2022   Mixed hyperlipidemia 09/03/2022   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   S/P hysterectomy with oophorectomy 08/16/2022   Vaginal discharge 08/16/2022   OSA (obstructive sleep apnea) 04/07/2021   Heart failure with preserved ejection fraction (HCC) 12/09/2020   CKD (chronic kidney disease) stage 3, GFR 30-59 ml/min (HCC) 07/28/2020   Anemia in neoplastic disease 07/28/2020   Essential hypertension 05/12/2020   Nocturnal hypoxemia 09/02/2019   Morbid obesity with BMI of 50.0-59.9, adult (HCC) 06/28/2019   SOB (shortness of breath) 06/27/2019   Goals of care, counseling/discussion 02/25/2019  H/O autologous stem cell transplant (HCC) 05/13/2015   Claustrophobia 10/05/2014   Multiple myeloma (HCC) 09/23/2014   IDA (iron deficiency anemia) 08/03/2014   Leukopenia 08/03/2014   Primary osteoarthritis of both knees 11/18/2013    ONSET DATE: 09/01/2023   REFERRING DIAG: aphasia, h/o CVA  THERAPY DIAG:  Aphasia  Cognitive communication deficit  Rationale for Evaluation and Treatment: Rehabilitation  SUBJECTIVE:   SUBJECTIVE STATEMENT: Pig feet. (Favorite meal)  Pt  accompanied by: self and family member  PERTINENT HISTORY: Kelli Hurley is a 68 yo female who was referred for SLE by Halbert Mariano SQUIBB, DO due to recent stroke. Pt was at [facility] Admission 7/26-7/28/25 for acute expressive aphasia 2/2 acute ischemic stroke involving left frontal lobe.  aphasia, h/o CVA  PAIN:  Are you having pain? No  FALLS: Has patient fallen in last 6 months?  No  LIVING ENVIRONMENT: Lives with: lives alone Lives in: House/apartment  PLOF:  Level of assistance: Independent with ADLs, Independent with IADLs Employment: On disability  PATIENT GOALS: Improve speech  OBJECTIVE:  Note: Objective measures were completed at Evaluation unless otherwise noted.  DIAGNOSTIC FINDINGS:  MRI: small subacute ischemic infarct involving left frontal lobe without hemorrhagic conversion. Partially empty sella concerning for degree of intracranial hypertension.   STANDARDIZED ASSESSMENTS: SLUMS: 17/30 and BNT short form: 9/15                                                                                                                       PREVIOUS TREATMENT:  Pt accompanied to therapy by her sister. SLP shared goals of therapy and word finding strategies with her sister. SLP facilitated targeted verbal responses by assisting filling in additional items on her communication template (speech strategies, food items, travel, and hobbies). Pt verbalized (divergent naming) favorite food items eaten after church and for a grocery list with 100% acc when allowed extra time and indirect cues. She shared her Sunday church routine including accurate times of each event with min cues from her sister. Speech was fluent and minimal word finding difficulties noted today. She indicates that she has greater trouble verbalizing when she gets excited. Pt encouraged to review the communication template out loud at home daily and continue to target goals.  Current Treatment: 11/08/23  Pt  accompanied to therapy by family. She reports that she feels that she is doing very well communicating her thoughts with friends and family. She asked if she needed all of the SLP appointments scheduled and SLP stated that we would not, if she is satisfied with her communication abilities. In session, she answered a variety of wh questions (who, what, when, why) with 100% acc. She was shown a dynamic photo and asked to verbalize what each person in the photo might be thinking and did so with 100% acc. She named objects to SLP description with 100% acc and described without naming via verbalizing at least three salient features with 100% acc. She had more difficulty creating synonyms for a  given word and needed mod cues from SLP. She was given this for HEP and will target next session.    PATIENT EDUCATION: Education details: HEP provided to facilitate verbal expression progress at home Person educated: Patient Education method: Explanation, Demonstration, Verbal cues, and Handouts Education comprehension: verbalized understanding   GOALS: Goals reviewed with patient? Yes  SHORT TERM GOALS: Target date: 11/14/2023  Pt will implement memory strategies in functional therapy activities with 90% acc with mi/mod cues. Baseline: 80% Goal status: ONGOING  2.  Pt will increase naming of common objects/pictures to 90% acc when provided with min multimodality cues Baseline: ~75% medium frequency words Goal status: ONGOING  3.  Pt will implement word-finding strategies with 90% accuracy when unable to verbalize desired word in conversation/functional tasks with mi/mod assist.  Baseline: mod assist Goal status: ONGOING  4.  Pt will describe objects and pictures by providing at least three salient features as judged by clinician with 90% acc when provided mi/mod cues.  Baseline: 75% acc Goal status: ONGOING  5.  Pt will increase divergent naming to 12+ items per concrete category with min cues.   Baseline: ~8 Goal status: ONGOING  6.  Pt will follow multistep auditory directions with 90% acc when provided mi/mod cues. Baseline:  Goal status: ONGOING  LONG TERM GOALS: Target date: 01/14/2024  Pt will communicate moderate level wants/needs/thoughts to family and friends with use of multimodality communication strategies as needed. Baseline: Min/Mod assist Goal status: ONGOING  ASSESSMENT:  CLINICAL IMPRESSION: (from initial evaluation 10/10/2023) Patient is a 68 y.o. female who was seen today for a cognitive linguistic evaluation. Pt presents with mild/mod expressive aphasia, mild receptive aphasia, and mild cognitive deficits (memory and attention) negatively impacting functional communication and managing complex higher level executive functioning tasks. Pt is generally aware of her speech deficits and expresses frustration about difficulty communicating what she wants to say. Pt scored a 17/30 on the [facility] with errors in mental calculation, attention, memory, and clock drawing. She scored a 9/15 on the [employer] short form and exhibited phonemic paraphasias. In conversation, she displayed the same paraphasic errors especially with multi-syllabic words. She achieved 10/10 for fluency and content in the picture description task and was able to repeat sentences with 100% acc. Recommend SLP therapy to address the above stated deficits and increase independence with communication. Pt is highly motivated to improve her speech.   OBJECTIVE IMPAIRMENTS: include attention, memory, executive functioning, expressive language, receptive language, and aphasia. These impairments are limiting patient from managing appointments, managing finances, and effectively communicating at home and in community. Factors affecting potential to achieve goals and functional outcome are family/community support. Patient will benefit from skilled SLP services to address above impairments and improve  overall function.  REHAB POTENTIAL: Excellent  PLAN:  SLP FREQUENCY: 2x/week  SLP DURATION: 8 weeks  PLANNED INTERVENTIONS: Cueing hierachy, Cognitive reorganization, Internal/external aids, Functional tasks, Multimodal communication approach, SLP instruction and feedback, Compensatory strategies, Patient/family education, [phone] Treatment of speech (30 or 45 min) , and [id number]- Speech [id number] Artic, Phon, Eval Compre, Express   Thank you,  Lamar Candy, CCC-SLP [phone]   Gagandeep Pettet, CCC-SLP 11/08/2023, 10:50 AM

## 2023-11-12 ENCOUNTER — Ambulatory Visit (HOSPITAL_COMMUNITY): Admitting: Speech Pathology

## 2023-11-12 DIAGNOSIS — R41841 Cognitive communication deficit: Secondary | ICD-10-CM

## 2023-11-12 DIAGNOSIS — R4701 Aphasia: Secondary | ICD-10-CM

## 2023-11-12 NOTE — Therapy (Signed)
 OUTPATIENT SPEECH LANGUAGE PATHOLOGY TREATMENT   Patient Name: Kelli Hurley MRN: [id number] DOB:[birth year], 68 y.o., female Today's Date: 11/12/2023  PCP: Halbert Mariano SQUIBB, DO REFERRING PROVIDER: Halbert Mariano SQUIBB, DO  END OF SESSION:  End of Session - 11/12/23 1036     Visit Number 5    Number of Visits 9    Date for Recertification  11/29/23    Authorization Type [employer] Medicare dual complete no auth required    SLP Start Time 1030    SLP Stop Time  1115    SLP Time Calculation (min) 45 min    Activity Tolerance Patient tolerated treatment well          Past Medical History:  Diagnosis Date   Anemia    Arthritis    Asthma    Cancer (HCC)    Claustrophobia 10/05/2014   Diabetes mellitus without complication (HCC)    Dyspnea    Hypertension    Leukopenia 08/03/2014   Normocytic hypochromic anemia 08/03/2014   Renal disorder    stage 3    Sleep apnea    Past Surgical History:  Procedure Laterality Date   ABDOMINAL HYSTERECTOMY     CARDIAC SURGERY     21 months old. States she had a leaky valve.    COLECTOMY WITH COLOSTOMY CREATION/HARTMANN PROCEDURE N/A 01/24/2023   Procedure: EXPLORATORY LAPAROTOMY;  Surgeon: Kallie Manuelita JAYSON, MD;  Location: [facility];  Service: General;  Laterality: N/A;   COLON RESECTION SIGMOID  01/24/2023   Procedure: SIGMOID COLECTOMY WITH END COLOSTOMY;  Surgeon: Kallie Manuelita JAYSON, MD;  Location: [facility];  Service: General;;   COLONOSCOPY  03/26/2009   Dr. Harvey; normal colon, small internal hemorrhoids.  Recommended repeat colonoscopy in 10 years.   COLONOSCOPY WITH PROPOFOL  N/A 11/25/2019   Procedure: COLONOSCOPY WITH PROPOFOL ;  Surgeon: Cindie Carlin POUR, DO;  Location: [facility];  Service: Endoscopy;  Laterality: N/A;  10:45am   IR PATIENT EVAL TECH 0-60 MINS  09/08/2022   IR URETERAL STENT PLACEMENT EXISTING ACCESS LEFT  11/24/2022   OTHER SURGICAL HISTORY     heart surgery as infant to repair hole in heart   PORTACATH  PLACEMENT Left 02/21/2019   Procedure: INSERTION PORT-A-CATH;  Surgeon: Kallie Manuelita JAYSON, MD;  Location: [facility];  Service: General;  Laterality: Left;   Patient Active Problem List   Diagnosis Date Noted   Colostomy complication (HCC) 02/19/2023   Irritant contact dermatitis associated with fecal stoma 02/07/2023   Colostomy care (HCC) 02/07/2023   Right ureteral injury 01/27/2023   Acute postoperative anemia due to expected blood loss 01/26/2023   Acute renal failure superimposed on stage 4 chronic kidney disease (HCC) 01/26/2023   Paroxysmal atrial flutter (HCC) 01/02/2023   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   Hydronephrosis due to obstruction of ureter 09/05/2022   Lactic acidosis 09/03/2022   Hyponatremia 09/03/2022   Hypoalbuminemia due to protein-calorie malnutrition 09/03/2022   Hypokalemia 09/03/2022   Mixed hyperlipidemia 09/03/2022   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   S/P hysterectomy with oophorectomy 08/16/2022   Vaginal discharge 08/16/2022   OSA (obstructive sleep apnea) 04/07/2021   Heart failure with preserved ejection fraction (HCC) 12/09/2020   CKD (chronic kidney disease) stage 3, GFR 30-59 ml/min (HCC) 07/28/2020   Anemia in neoplastic disease 07/28/2020   Essential hypertension 05/12/2020   Nocturnal hypoxemia 09/02/2019   Morbid obesity with BMI of 50.0-59.9, adult (HCC) 06/28/2019   SOB (shortness of breath) 06/27/2019   Goals of care, counseling/discussion 02/25/2019  H/O autologous stem cell transplant (HCC) 05/13/2015   Claustrophobia 10/05/2014   Multiple myeloma (HCC) 09/23/2014   IDA (iron deficiency anemia) 08/03/2014   Leukopenia 08/03/2014   Primary osteoarthritis of both knees 11/18/2013    ONSET DATE: 09/01/2023   REFERRING DIAG: aphasia, h/o CVA  THERAPY DIAG:  Aphasia  Cognitive communication deficit  Rationale for Evaluation and Treatment: Rehabilitation  SUBJECTIVE:   SUBJECTIVE STATEMENT: I feel good.  Pt accompanied  by: self and family member  PERTINENT HISTORY: Kelli Hurley is a 68 yo female who was referred for SLE by Halbert Mariano SQUIBB, DO due to recent stroke. Pt was at [facility]mission 7/26-7/28/25 for acute expressive aphasia 2/2 acute ischemic stroke involving left frontal lobe.  aphasia, h/o CVA  PAIN:  Are you having pain? No  FALLS: Has patient fallen in last 6 months?  No  LIVING ENVIRONMENT: Lives with: lives alone Lives in: House/apartment  PLOF:  Level of assistance: Independent with ADLs, Independent with IADLs Employment: On disability  PATIENT GOALS: Improve speech  OBJECTIVE:  Note: Objective measures were completed at Evaluation unless otherwise noted.  DIAGNOSTIC FINDINGS:  MRI: small subacute ischemic infarct involving left frontal lobe without hemorrhagic conversion. Partially empty sella concerning for degree of intracranial hypertension.   STANDARDIZED ASSESSMENTS: SLUMS: 17/30 and BNT short form: 9/15                                                                                                                       PREVIOUS TREATMENT:  Pt accompanied to therapy by family. She reports that she feels that she is doing very well communicating her thoughts with friends and family. She asked if she needed all of the SLP appointments scheduled and SLP stated that we would not, if she is satisfied with her communication abilities. In session, she answered a variety of wh questions (who, what, when, why) with 100% acc. She was shown a dynamic photo and asked to verbalize what each person in the photo might be thinking and did so with 100% acc. She named objects to SLP description with 100% acc and described without naming via verbalizing at least three salient features with 100% acc. She had more difficulty creating synonyms for a given word and needed mod cues from SLP. She was given this for HEP and will target next session.  Current Treatment: 11/12/23 Pt accompanied  to therapy by family and both report improved communication skills. She reports that that she is 9/10 back to baseline.   PATIENT EDUCATION: Education details: HEP provided to facilitate verbal expression progress at home Person educated: Patient Education method: Explanation, Demonstration, Verbal cues, and Handouts Education comprehension: verbalized understanding   GOALS: Goals reviewed with patient? Yes  SHORT TERM GOALS: Target date: 11/14/2023  Pt will implement memory strategies in functional therapy activities with 90% acc with mi/mod cues. Baseline: 80% Goal status: ONGOING  2.  Pt will increase naming of common objects/pictures to 90% acc when provided with min multimodality  cues Baseline: ~75% medium frequency words Goal status: ONGOING  3.  Pt will implement word-finding strategies with 90% accuracy when unable to verbalize desired word in conversation/functional tasks with mi/mod assist.  Baseline: mod assist Goal status: ONGOING  4.  Pt will describe objects and pictures by providing at least three salient features as judged by clinician with 90% acc when provided mi/mod cues.  Baseline: 75% acc Goal status: ONGOING  5.  Pt will increase divergent naming to 12+ items per concrete category with min cues.  Baseline: ~8 Goal status: ONGOING  6.  Pt will follow multistep auditory directions with 90% acc when provided mi/mod cues. Baseline:  Goal status: ONGOING  LONG TERM GOALS: Target date: 01/14/2024  Pt will communicate moderate level wants/needs/thoughts to family and friends with use of multimodality communication strategies as needed. Baseline: Min/Mod assist Goal status: ONGOING  ASSESSMENT:  CLINICAL IMPRESSION: (from initial evaluation 10/10/2023) Patient is a 68 y.o. female who was seen today for a cognitive linguistic evaluation. Pt presents with mild/mod expressive aphasia, mild receptive aphasia, and mild cognitive deficits (memory and attention)  negatively impacting functional communication and managing complex higher level executive functioning tasks. Pt is generally aware of her speech deficits and expresses frustration about difficulty communicating what she wants to say. Pt scored a 17/30 on the [facility] with errors in mental calculation, attention, memory, and clock drawing. She scored a 9/15 on the [employer] short form and exhibited phonemic paraphasias. In conversation, she displayed the same paraphasic errors especially with multi-syllabic words. She achieved 10/10 for fluency and content in the picture description task and was able to repeat sentences with 100% acc. Recommend SLP therapy to address the above stated deficits and increase independence with communication. Pt is highly motivated to improve her speech.   OBJECTIVE IMPAIRMENTS: include attention, memory, executive functioning, expressive language, receptive language, and aphasia. These impairments are limiting patient from managing appointments, managing finances, and effectively communicating at home and in community. Factors affecting potential to achieve goals and functional outcome are family/community support. Patient will benefit from skilled SLP services to address above impairments and improve overall function.  REHAB POTENTIAL: Excellent  PLAN:  SLP FREQUENCY: 2x/week  SLP DURATION: 8 weeks  PLANNED INTERVENTIONS: Cueing hierachy, Cognitive reorganization, Internal/external aids, Functional tasks, Multimodal communication approach, SLP instruction and feedback, Compensatory strategies, Patient/family education, [phone] Treatment of speech (30 or 45 min) , and 07476- Speech Eval Sound Prod, Artic, Phon, Eval Compre, Express   Thank you,  Lamar Candy, CCC-SLP [phone]   Timberly Yott, CCC-SLP 11/12/2023, 10:37 AM  SPEECH THERAPY DISCHARGE SUMMARY  Visits from Start of Care: 5  Current functional level related to goals / functional  outcomes: ***   Remaining deficits: ***   Education / Equipment: ***   Patient agrees to discharge. Patient goals were met. Patient is being discharged due to being pleased with the current functional level.SABRA

## 2023-11-20 ENCOUNTER — Encounter (HOSPITAL_COMMUNITY): Admitting: Speech Pathology

## 2023-11-22 ENCOUNTER — Encounter (HOSPITAL_COMMUNITY): Admitting: Speech Pathology

## 2023-11-26 ENCOUNTER — Encounter (HOSPITAL_COMMUNITY): Admitting: Speech Pathology

## 2023-11-27 ENCOUNTER — Other Ambulatory Visit: Payer: Self-pay

## 2023-11-27 DIAGNOSIS — G8929 Other chronic pain: Secondary | ICD-10-CM

## 2023-11-27 MED ORDER — OXYCODONE-ACETAMINOPHEN 5-325 MG PO TABS
1.0000 | ORAL_TABLET | Freq: Two times a day (BID) | ORAL | 0 refills | Status: DC | PRN
Start: 2023-11-27 — End: 2024-01-02

## 2023-11-29 ENCOUNTER — Encounter (HOSPITAL_COMMUNITY): Admitting: Speech Pathology

## 2023-11-30 ENCOUNTER — Other Ambulatory Visit: Payer: Self-pay

## 2023-11-30 ENCOUNTER — Other Ambulatory Visit (HOSPITAL_COMMUNITY): Payer: Self-pay | Admitting: Family Medicine

## 2023-11-30 DIAGNOSIS — C9000 Multiple myeloma not having achieved remission: Secondary | ICD-10-CM

## 2023-11-30 DIAGNOSIS — Z1231 Encounter for screening mammogram for malignant neoplasm of breast: Secondary | ICD-10-CM

## 2023-11-30 MED ORDER — POMALIDOMIDE 4 MG PO CAPS: 4.0000 mg | ORAL_CAPSULE | Freq: Every day | ORAL | 0 refills | Status: DC

## 2023-11-30 NOTE — Telephone Encounter (Signed)
 Chart reviewed. Pomalyst  refilled per last office note with Dr. Davonna.

## 2023-12-11 ENCOUNTER — Inpatient Hospital Stay: Attending: Hematology

## 2023-12-11 ENCOUNTER — Ambulatory Visit (HOSPITAL_COMMUNITY)
Admission: RE | Admit: 2023-12-11 | Discharge: 2023-12-11 | Disposition: A | Discharge status: Home / Self Care | Source: Ambulatory Visit | Attending: Urology | Admitting: Urology

## 2023-12-11 VITALS — BP 116/76 | HR 85 | Temp 96.8°F | Resp 20

## 2023-12-11 DIAGNOSIS — Z23 Encounter for immunization: Secondary | ICD-10-CM | POA: Diagnosis not present

## 2023-12-11 DIAGNOSIS — C9002 Multiple myeloma in relapse: Secondary | ICD-10-CM | POA: Diagnosis present

## 2023-12-11 DIAGNOSIS — N131 Hydronephrosis with ureteral stricture, not elsewhere classified: Secondary | ICD-10-CM | POA: Insufficient documentation

## 2023-12-11 DIAGNOSIS — D500 Iron deficiency anemia secondary to blood loss (chronic): Secondary | ICD-10-CM

## 2023-12-11 DIAGNOSIS — Z79899 Other long term (current) drug therapy: Secondary | ICD-10-CM | POA: Insufficient documentation

## 2023-12-11 DIAGNOSIS — C9000 Multiple myeloma not having achieved remission: Secondary | ICD-10-CM

## 2023-12-11 LAB — FERRITIN: Ferritin: 1025 ng/mL — ABNORMAL HIGH (ref 11–307)

## 2023-12-11 LAB — CBC WITH DIFFERENTIAL/PLATELET
Abs Immature Granulocytes: 0.1 K/uL — ABNORMAL HIGH (ref 0.00–0.07)
Basophils Absolute: 0.1 K/uL (ref 0.0–0.1)
Basophils Relative: 1 %
Eosinophils Absolute: 0 K/uL (ref 0.0–0.5)
Eosinophils Relative: 0 %
HCT: 29.6 % — ABNORMAL LOW (ref 36.0–46.0)
Hemoglobin: 9.4 g/dL — ABNORMAL LOW (ref 12.0–15.0)
Immature Granulocytes: 2 %
Lymphocytes Relative: 15 %
Lymphs Abs: 0.9 K/uL (ref 0.7–4.0)
MCH: 29.7 pg (ref 26.0–34.0)
MCHC: 31.8 g/dL (ref 30.0–36.0)
MCV: 93.4 fL (ref 80.0–100.0)
Monocytes Absolute: 0.1 K/uL (ref 0.1–1.0)
Monocytes Relative: 1 %
Neutro Abs: 5.1 K/uL (ref 1.7–7.7)
Neutrophils Relative %: 81 %
Platelets: 249 K/uL (ref 150–400)
RBC: 3.17 MIL/uL — ABNORMAL LOW (ref 3.87–5.11)
RDW: 15.9 % — ABNORMAL HIGH (ref 11.5–15.5)
WBC: 6.2 K/uL (ref 4.0–10.5)
nRBC: 0 % (ref 0.0–0.2)

## 2023-12-11 LAB — COMPREHENSIVE METABOLIC PANEL WITH GFR
ALT: 10 U/L (ref 0–44)
AST: 19 U/L (ref 15–41)
Albumin: 4 g/dL (ref 3.5–5.0)
Alkaline Phosphatase: 62 U/L (ref 38–126)
Anion gap: 17 — ABNORMAL HIGH (ref 5–15)
BUN: 45 mg/dL — ABNORMAL HIGH (ref 8–23)
CO2: 25 mmol/L (ref 22–32)
Calcium: 9.7 mg/dL (ref 8.9–10.3)
Chloride: 95 mmol/L — ABNORMAL LOW (ref 98–111)
Creatinine, Ser: 2.72 mg/dL — ABNORMAL HIGH (ref 0.44–1.00)
GFR, Estimated: 18 mL/min — ABNORMAL LOW (ref >60)
Glucose, Bld: 226 mg/dL — ABNORMAL HIGH (ref 70–99)
Potassium: 3.4 mmol/L — ABNORMAL LOW (ref 3.5–5.1)
Sodium: 136 mmol/L (ref 135–145)
Total Bilirubin: 0.3 mg/dL (ref 0.0–1.2)
Total Protein: 7.1 g/dL (ref 6.5–8.1)

## 2023-12-11 LAB — IRON AND TIBC
Iron: 43 ug/dL (ref 28–170)
Saturation Ratios: 18 % (ref 10.4–31.8)
TIBC: 239 ug/dL — ABNORMAL LOW (ref 250–450)
UIBC: 196 ug/dL

## 2023-12-11 MED ORDER — INFLUENZA VAC SPLIT HIGH-DOSE 0.5 ML IM SUSY
0.5000 mL | PREFILLED_SYRINGE | Freq: Once | INTRAMUSCULAR | Status: AC
  Administered 2023-12-11: 0.5 mL via INTRAMUSCULAR
  Filled 2023-12-11: qty 0.5

## 2023-12-11 NOTE — Patient Instructions (Signed)
 [facility] [place] - A DEPT OF [facility]  Discharge Instructions: Thank you for choosing [facility] to provide your oncology and hematology care.  If you have a lab appointment with the Cancer Center - please note that after April 8th, 2024, all labs will be drawn in the cancer center.  You do not have to check in or register with the main entrance as you have in the past but will complete your check-in in the cancer center.  Wear comfortable clothing and clothing appropriate for easy access to any Portacath or PICC line.   We strive to give you quality time with your provider. You may need to reschedule your appointment if you arrive late (15 or more minutes).  Arriving late affects you and other patients whose appointments are after yours.  Also, if you miss three or more appointments without notifying the office, you may be dismissed from the clinic at the provider's discretion.      For prescription refill requests, have your pharmacy contact our office and allow 72 hours for refills to be completed.    Today you received the following port flush and influenza, return as scheduled.   To help prevent nausea and vomiting after your treatment, we encourage you to take your nausea medication as directed.  BELOW ARE SYMPTOMS THAT SHOULD BE REPORTED IMMEDIATELY: *FEVER GREATER THAN 100.4 F (38 C) OR HIGHER *CHILLS OR SWEATING *NAUSEA AND VOMITING THAT IS NOT CONTROLLED WITH YOUR NAUSEA MEDICATION *UNUSUAL SHORTNESS OF BREATH *UNUSUAL BRUISING OR BLEEDING *URINARY PROBLEMS (pain or burning when urinating, or frequent urination) *BOWEL PROBLEMS (unusual diarrhea, constipation, pain near the anus) TENDERNESS IN MOUTH AND THROAT WITH OR WITHOUT PRESENCE OF ULCERS (sore throat, sores in mouth, or a toothache) UNUSUAL RASH, SWELLING OR PAIN  UNUSUAL VAGINAL DISCHARGE OR ITCHING   Items with * indicate a potential emergency and should be followed up as  soon as possible or go to the Emergency Department if any problems should occur.  Please show the CHEMOTHERAPY ALERT CARD or IMMUNOTHERAPY ALERT CARD at check-in to the Emergency Department and triage nurse.  Should you have questions after your visit or need to cancel or reschedule your appointment, please contact [facility] [place] - A DEPT OF [facility] [phone]  and follow the prompts.  Office hours are 8:00 a.m. to 4:30 p.m. Monday - Friday. Please note that voicemails left after 4:00 p.m. may not be returned until the following business day.  We are closed weekends and major holidays. You have access to a nurse at all times for urgent questions. Please call the main number to the clinic [phone] and follow the prompts.  For any non-urgent questions, you may also contact your provider using MyChart. We now offer e-Visits for anyone 64 and older to request care online for non-urgent symptoms. For details visit [email or web address].   Also download the MyChart app! Go to the app store, search MyChart, open the app, select [place], and log in with your MyChart username and password.

## 2023-12-11 NOTE — Progress Notes (Signed)
 Patient tolerated flu injection with no complaints voiced. Site clean and dry with no bruising or swelling noted at site. See MAR for details. Band aid applied.  Patient stable during and after injection. VSS with discharge and left in satisfactory condition with no s/s of distress noted. Port flushed with good blood return noted. No bruising or swelling at site. Bandaid applied and patient discharged in satisfactory condition. VVS stable with no signs or symptoms of distressed noted.

## 2023-12-12 LAB — KAPPA/LAMBDA LIGHT CHAINS
Kappa free light chain: 39.5 mg/L — ABNORMAL HIGH (ref 3.3–19.4)
Kappa, lambda light chain ratio: 1.02 (ref 0.26–1.65)
Lambda free light chains: 38.8 mg/L — ABNORMAL HIGH (ref 5.7–26.3)

## 2023-12-14 LAB — PROTEIN ELECTROPHORESIS, SERUM
A/G Ratio: 1 (ref 0.7–1.7)
Albumin ELP: 3.3 g/dL (ref 2.9–4.4)
Alpha-1-Globulin: 0.4 g/dL (ref 0.0–0.4)
Alpha-2-Globulin: 1 g/dL (ref 0.4–1.0)
Beta Globulin: 1.1 g/dL (ref 0.7–1.3)
Gamma Globulin: 0.8 g/dL (ref 0.4–1.8)
Globulin, Total: 3.3 g/dL (ref 2.2–3.9)
M-Spike, %: 0.2 g/dL — ABNORMAL HIGH
Total Protein ELP: 6.6 g/dL (ref 6.0–8.5)

## 2023-12-16 LAB — IMMUNOFIXATION ELECTROPHORESIS
IgA: 96 mg/dL (ref 87–352)
IgG (Immunoglobin G), Serum: 791 mg/dL (ref 586–1602)
IgM (Immunoglobulin M), Srm: 49 mg/dL (ref 26–217)
Total Protein ELP: 6.5 g/dL (ref 6.0–8.5)

## 2023-12-18 ENCOUNTER — Inpatient Hospital Stay: Admitting: Oncology

## 2023-12-18 VITALS — BP 131/72 | HR 89 | Temp 98.7°F | Resp 18 | Ht 62.0 in | Wt 308.2 lb

## 2023-12-18 DIAGNOSIS — D500 Iron deficiency anemia secondary to blood loss (chronic): Secondary | ICD-10-CM | POA: Diagnosis not present

## 2023-12-18 DIAGNOSIS — C9002 Multiple myeloma in relapse: Secondary | ICD-10-CM | POA: Diagnosis not present

## 2023-12-18 DIAGNOSIS — N184 Chronic kidney disease, stage 4 (severe): Secondary | ICD-10-CM | POA: Diagnosis not present

## 2023-12-18 DIAGNOSIS — C9000 Multiple myeloma not having achieved remission: Secondary | ICD-10-CM | POA: Diagnosis not present

## 2023-12-18 NOTE — Progress Notes (Signed)
Patient is taking Pomalyst as prescribed.  She has not missed any doses and reports no side effects at this time.   

## 2023-12-18 NOTE — Patient Instructions (Signed)

## 2023-12-18 NOTE — Progress Notes (Signed)
 Patient Care Team: Halbert Mariano SQUIBB, DO as PCP - General (Family Medicine) Mallipeddi, Vishnu P, MD as PCP - Cardiology (Cardiology) Darlean Ozell NOVAK, MD as Consulting Physician (Pulmonary Disease)  Clinic Day:  12/18/2023  Referring physician: Halbert Mariano SQUIBB, DO   CHIEF COMPLAINT:  CC: Relapsed IgG kappa multiple myeloma    ASSESSMENT & PLAN:   Assessment & Plan: Kelli Hurley  is a 68 y.o. 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 -Overall multiple myeloma labs are more or less stable.  Will continue to monitor this - Continue Pomalyst  4mg  3 weeks on/1 week off - If multiple myeloma labs continue to worsen, will consider bone marrow biopsy and discuss further steps.   Return to clinic in 3 months with labs  Iron deficiency anemia due to chronic blood loss Anemia improved with IV iron. Hemoglobin remains low, possibly due to chronic kidney disease. Tsat of 18, Ferritin: 1025  - Monitor hemoglobin levels. - Consider erythropoiesis-stimulating agent if hemoglobin stays <10.  Chronic kidney disease May contribute to low hemoglobin levels. Follow-up with nephrologist Dr. Rachele. - Continue follow-up with nephrologist Dr. Rachele.    The patient understands the plans discussed today and is in agreement with them.  She knows to contact our office if she develops concerns prior to her next appointment.  The total time spent in the appointment was 20 minutes for the encounter with patient, including review of chart and various tests results, discussions about  plan of care and coordination of care plan   Mickiel Dry, MD  [facility] [place] - A DEPT OF [facility] [address] [place] [address] Dept: [phone] Dept Fax: [phone]   Orders Placed This Encounter  Procedures   CBC with Differential    Standing Status:   Future    Expected Date:   03/10/2024    Expiration Date:   06/08/2024   Comprehensive metabolic panel    Standing Status:   Future    Expected Date:   03/10/2024    Expiration Date:   06/08/2024   Iron and TIBC ([facility] ONLY)    Standing Status:   Future    Expected Date:   03/10/2024    Expiration Date:   06/08/2024   Ferritin    Standing Status:   Future    Expected Date:   03/10/2024    Expiration Date:   06/08/2024   Vitamin B12    Standing Status:   Future    Expected Date:   03/10/2024    Expiration Date:   06/08/2024   Folate    Standing Status:   Future    Expected Date:   03/10/2024    Expiration Date:   06/08/2024   Kappa/lambda light chains    Standing Status:   Future    Expected Date:   03/10/2024    Expiration Date:   06/08/2024   Protein electrophoresis, serum    Standing Status:   Future    Expected Date:   03/10/2024    Expiration Date:   06/08/2024  ONCOLOGY HISTORY:   I have reviewed her chart and materials related to her cancer extensively and collaborated history with the patient. Summary of oncologic history is as follows:   IgG kappa multiple myeloma, stage II by R-ISS:  -08/03/2014: Presented with anemia, leukopenia and cryoglobulinemia -08/03/2014: M Spike: 2.7, IFE: IgG monoclonal protein with kappa light chain specificity. IgG:4157, IgM:40, IgA:38. KFLC:104.60, LFLC:11.54, Ratio:9.06. Beta 2 microglobulin: 3.7. LDH:171,Urine kappa/lamba ratio: 5.11.Ca:9.1, Cr:0.8, Hb: 8.9 -08/06/2024: Bone survey: No lytic lesions are noted in the visualized skeleton. - 09/03/2014: Bone marrow biopsy: Normocellular bone marrow for  age with plasma cell neoplasm (18% of all cells). Complex karyotype -09/30/2014: PET scan: No abnormal hypermetabolism in the neck, chest, abdomen or pelvis. -10/05/2014-03/22/2015: Revlimid ,Velcade , Dexamethasone  -10/05/2014- 01/28/2020: Zometa  monthly -04/07/2015: Pre transplant evaluation:  - PET scan: No abnormal FDG uptake([facility]) -BMBx: Normocellular marrow (40%) with approximately 2-4% plasma cells.  -Labs: FKLC:45.1, FLLC:21.6, ratio:2.09. Beta 2 microglobulin: 3.27,M spike: 0.55, PQZ:fnwnronwjo IgG kappa. Normal immunoglobulin -05/07/2015: Autologous SCT [facility] Dr.Rodriguez Velinda) -09/08/2015-04/26/2016: Velcade  maintenance every 2 weeks -03/30/2016: M spike: 0.4, KFLC:22.7,LFLC:15.4,Ratio: 1.61. -04/26/2016-02/12/2019 : Ixazomib 3mg  and velcade   days 1,8,15 of 28 day cycle -11/13/2018: KFLC:43.1, LFLC:15.8,ratio:2.73. M spike:1.1 -02/10/2019: PET scan: No abnormal osseous or extraosseous hypermetabolism in the neck, chest, abdomen or pelvis.  Mild cardiomegaly. - 02/14/2019: Bone marrow biopsy: Variably cellular bone marrow with plasma cell neoplasm.  10 to 15% of plasma cells.  Multiple myeloma FISH: +1 q., deletion 13 q., deletion 17P detected.  Normal female karyotype. -03/05/2019-08/06/2019: Carfilzomib  + pomalidomide  and dexamethasone . Carfilzomib  discontinued after 08/06/2019 due to fluid retention -08/08/2019-Current: Pomalidomide  maintenance 4mg  daily 21 days on/7 days off -03/19/2020: Bone marrow biopsy: Normocellular bone marrow with trilineage hematopoiesis and mild plasmacytosis.  Plasma cells are mildly increased on aspirate smears(4% by manual differential count), with scattered atypical large forms.  CD138 IHC on the clot and core biopsy highlight a mild increase in plasma cells with small clusters comprising 5% of overall marrow cellularity.  Normal multiple myeloma FISH panel.  Normal female karyotype - 03/22/2020: PET scan: No evidence of active  malignancy.   Current Treatment:  Pomalidomide  maintenance 4mg  daily 3 weeks on/1 week off and dexamethasone  20mg  weekly  INTERVAL HISTORY:   Discussed the use of AI scribe software for clinical note transcription with the patient, who gave verbal consent to proceed.  History of Present Illness Kelli Hurley is a 68 year old female with multiple myeloma is here for follow up.She is accompanied by her niece today.  She experiences knee pain for which she takes oxycodone , and the medication helps alleviate the pain. No other pain or swelling is reported besides the knee pain.  She is currently on Pomalyst  therapy, following a regimen of three weeks on and one week off. She recently received IV iron; she stays cold all the time and often wears a jacket.  She has a history of chronic kidney disease and is under the care of Dr. Rachele for this condition. She has not had a recent follow-up with a gastroenterologist and has not had a colonoscopy or endoscopy in a long time.     I have reviewed the past medical history, past surgical history, social history and family history with the patient and they are unchanged from previous note.  ALLERGIES:  is allergic to diclofenac .  MEDICATIONS:  Current Outpatient Medications  Medication Sig Dispense Refill   acetaminophen  (TYLENOL ) 650 MG CR tablet Take 650 mg by mouth every 8 (eight) hours as needed  for pain.     albuterol  (VENTOLIN  HFA) 108 (90 Base) MCG/ACT inhaler Inhale 2 puffs into the lungs every 6 (six) hours as needed for wheezing or shortness of breath. 8 g 3   ASPIRIN  LOW DOSE 81 MG tablet Take 81 mg by mouth every morning.     calcitRIOL (ROCALTROL) 0.25 MCG capsule 1 capsule.     dapagliflozin  propanediol (FARXIGA ) 10 MG TABS tablet 1 tablet Orally Once a day     dexamethasone  (DECADRON ) 4 MG tablet TAKE FIVE TABLETS BY MOUTH ONCE A WEEK 30 tablet 6   docusate sodium  (COLACE) 100 MG capsule Take 1 capsule (100 mg total) by mouth  2 (two) times daily. 30 capsule 0   Elastic Bandages & Supports (MEDICAL COMPRESSION STOCKINGS) MISC as directed apply knee high compression stockings; Duration: 30 days     ELIQUIS  5 MG TABS tablet TAKE ONE TABLET BY MOUTH TWICE DAILY 60 tablet 5   fluticasone (FLONASE) 50 MCG/ACT nasal spray 1 spray in each nostril Nasally Once a day; Duration: 30 day(s)     furosemide  (LASIX ) 20 MG tablet Take 40 mg by mouth 2 (two) times daily.     gabapentin  (NEURONTIN ) 300 MG capsule TAKE 1 CAPSULE BY MOUTH 3 TIMES DAILY 90 capsule 10   Infant Care Products (DERMACLOUD) OINT Apply 1 Application topically daily as needed (for skin irritation).     Lidocaine  3 % CREA 1 application as needed Externally Twice a day     lidocaine -prilocaine  (EMLA ) cream APPLY 1 QUARTER SIZED AMOUNT 1 HOUR PRIOR TO TREATMENT 30 g 10   magnesium  oxide (MAG-OX) 400 (240 Mg) MG tablet TAKE 1 TABLET BY MOUTH TWICE DAILY (Patient taking differently: Take 400 mg by mouth daily.) 60 tablet 6   methocarbamol  (ROBAXIN ) 500 MG tablet Take 1 tablet (500 mg total) by mouth every 8 (eight) hours as needed for muscle spasms. 30 tablet 0   metolazone  (ZAROXOLYN ) 2.5 MG tablet Take 1 tablet (2.5 mg total) by mouth 3 (three) times a week. Mon., Wed., and Friday 30 tablet 4   mirabegron  ER (MYRBETRIQ ) 25 MG TB24 tablet Take 1 tablet (25 mg total) by mouth daily. 30 tablet 0   Multiple Vitamin (TAB-A-VITE) TABS TAKE 1 TABLET BY MOUTH ONCE DAILY. (Patient taking differently: Take 1 tablet by mouth daily.) 30 tablet 11   ondansetron  (ZOFRAN -ODT) 4 MG disintegrating tablet Take 1 tablet (4 mg total) by mouth every 6 (six) hours as needed for nausea. 20 tablet 0   oxyCODONE  (OXY IR/ROXICODONE ) 5 MG immediate release tablet Take 1 tablet (5 mg total) by mouth every 4 (four) hours as needed for severe pain (pain score 7-10) or breakthrough pain. 8 tablet 0   oxyCODONE -acetaminophen  (PERCOCET/ROXICET) 5-325 MG tablet Take 1 tablet by mouth every 12 (twelve)  hours as needed for severe pain (pain score 7-10). TAKE 1 OR 2 TABLETS BY MOUTH EVERY TWELVE HOURS AS NEEDED for severe pain 60 tablet 0   pomalidomide  (POMALYST ) 4 MG capsule Take 1 capsule (4 mg total) by mouth daily for 21 days. 21 days on, 7 days off 21 capsule 0   potassium chloride  SA (KLOR-CON  M) 20 MEQ tablet TAKE 1 TABLET BY MOUTH 3 TIMES DAILY 90 tablet 10   pregabalin  (LYRICA ) 75 MG capsule Take 1 capsule (75 mg total) by mouth 2 (two) times daily. 60 capsule 3   rosuvastatin  (CRESTOR ) 40 MG tablet 1 tablet Orally Once a day     Semaglutide , 2 MG/DOSE, (OZEMPIC , 2  MG/DOSE,) 8 MG/3ML SOPN Inject 2 mg into the skin once a week. 3 mL 11   trolamine salicylate (ASPERCREME) 10 % cream Apply 1 application topically 2 (two) times daily as needed for muscle pain.     VITAMIN D  PO Take by mouth.     No current facility-administered medications for this visit.   Facility-Administered Medications Ordered in Other Visits  Medication Dose Route Frequency Provider Last Rate Last Admin   heparin  lock flush 100 unit/mL  500 Units Intravenous Once Penland, Clotilda POUR, MD       sodium chloride  0.9 % injection 10 mL  10 mL Intravenous Once Penland, Clotilda POUR, MD        REVIEW OF SYSTEMS:   Constitutional: Denies fevers, chills or abnormal weight loss Eyes: Denies blurriness of vision Ears, nose, mouth, throat, and face: Denies mucositis or sore throat Respiratory: Denies cough, dyspnea or wheezes Cardiovascular: Denies palpitation, chest discomfort  Gastrointestinal:  Denies nausea, heartburn or change in bowel habits Skin: Denies abnormal skin rashes Lymphatics: Denies new lymphadenopathy or easy bruising Neurological:Denies numbness, tingling or new weaknesses Behavioral/Psych: Mood is stable, no new changes   All other systems were reviewed with the patient and are negative.   VITALS:  Blood pressure 131/72, pulse 89, temperature 98.7 F (37.1 C), temperature source Tympanic, resp. rate  18, height 5' 2 (1.575 m), weight (!) 308 lb 3.2 oz (139.8 kg), SpO2 100%.  Wt Readings from Last 3 Encounters:  12/18/23 (!) 308 lb 3.2 oz (139.8 kg)  09/17/23 295 lb 6.7 oz (134 kg)  07/05/23 293 lb (132.9 kg)    Body mass index is 56.37 kg/m.  Performance status (ECOG): 2 - Symptomatic, <50% confined to bed  PHYSICAL EXAM:   GENERAL:alert, no distress and comfortable SKIN: skin color, texture, turgor are normal, no rashes or significant lesions LYMPH:  no palpable lymphadenopathy in the cervical, axillary or inguinal LUNGS: clear to auscultation and percussion with normal breathing effort HEART: regular rate & rhythm and no murmurs  ABDOMEN:abdomen soft, non-tender and normal bowel sounds Musculoskeletal:no cyanosis of digits and no clubbing  NEURO: alert & oriented x 3 with fluent speech  LABORATORY DATA:  I have reviewed the data as listed    Component Value Date/Time   NA 136 12/11/2023 1355   K 3.4 (L) 12/11/2023 1355   CL 95 (L) 12/11/2023 1355   CO2 25 12/11/2023 1355   GLUCOSE 226 (H) 12/11/2023 1355   BUN 45 (H) 12/11/2023 1355   CREATININE 2.72 (H) 12/11/2023 1355   CALCIUM  9.7 12/11/2023 1355   CALCIUM  9.9 07/10/2022 1209   PROT 7.1 12/11/2023 1355   ALBUMIN 4.0 12/11/2023 1355   AST 19 12/11/2023 1355   ALT 10 12/11/2023 1355   ALKPHOS 62 12/11/2023 1355   BILITOT 0.3 12/11/2023 1355   GFRNONAA 18 (L) 12/11/2023 1355   GFRAA 45 (L) 11/05/2019 1230    Lab Results  Component Value Date   WBC 6.2 12/11/2023   NEUTROABS 5.1 12/11/2023   HGB 9.4 (L) 12/11/2023   HCT 29.6 (L) 12/11/2023   MCV 93.4 12/11/2023   PLT 249 12/11/2023      Chemistry      Component Value Date/Time   NA 136 12/11/2023 1355   K 3.4 (L) 12/11/2023 1355   CL 95 (L) 12/11/2023 1355   CO2 25 12/11/2023 1355   BUN 45 (H) 12/11/2023 1355   CREATININE 2.72 (H) 12/11/2023 1355      Component Value  Date/Time   CALCIUM  9.7 12/11/2023 1355   CALCIUM  9.9 07/10/2022 1209    ALKPHOS 62 12/11/2023 1355   AST 19 12/11/2023 1355   ALT 10 12/11/2023 1355   BILITOT 0.3 12/11/2023 1355      Latest Reference Range & Units 12/11/23 13:55  Iron 28 - 170 ug/dL 43  UIBC ug/dL 803  TIBC 749 - 549 ug/dL 760 (L)  Saturation Ratios 10.4 - 31.8 % 18  Ferritin 11 - 307 ng/mL 1,025 (H)  (L): Data is abnormally low (H): Data is abnormally high    Latest Reference Range & Units 12/11/23 13:55  Total Protein ELP 6.0 - 8.5 g/dL 6.0 - 8.5 g/dL 6.5 6.6  Albumin ELP 2.9 - 4.4 g/dL 3.3  Globulin, Total 2.2 - 3.9 g/dL 3.3 (C)  A/G Ratio 0.7 - 1.7  1.0 (C)  Alpha-1-Globulin 0.0 - 0.4 g/dL 0.4  Joeyj-7-Honalopw 0.4 - 1.0 g/dL 1.0  Beta Globulin 0.7 - 1.3 g/dL 1.1  Gamma Globulin 0.4 - 1.8 g/dL 0.8  M-SPIKE, % Not Observed g/dL 0.2 (H)  SPE Interp.  Comment  Comment  Comment  IgG (Immunoglobin G), Serum 586 - 1,602 mg/dL 208  IgM (Immunoglobulin M), Srm 26 - 217 mg/dL 49  IgA 87 - 647 mg/dL 96  (H): Data is abnormally high (C): Corrected  IFE: Faint band in gamma region suspicious for monoclonal immunoglobulin.  This band may represent a benign spike as seen in older people or  could be a paraprotein as seen in Multiple Myeloma, Waldenstrom's  Macroglobulinemia or Lymphoma. Depending on clinical circumstances,  further diagnostic studies may include serum immunofixation or  serum free light chain quantitation.    Latest Reference Range & Units 12/11/23 13:55  Kappa free light chain 3.3 - 19.4 mg/L 39.5 (H)  Lambda free light chains 5.7 - 26.3 mg/L 38.8 (H)  Kappa, lambda light chain ratio 0.26 - 1.65  1.02  Immunofixation Result, Serum  Comment ! (C)  (H): Data is abnormally high !: Data is abnormal (C): Corrected  RADIOGRAPHIC STUDIES: I have personally reviewed the radiological images as listed and agreed with the findings in the report.  None new to review

## 2023-12-21 ENCOUNTER — Encounter: Payer: Self-pay | Admitting: Gastroenterology

## 2023-12-25 ENCOUNTER — Other Ambulatory Visit: Payer: Self-pay

## 2023-12-25 DIAGNOSIS — C9000 Multiple myeloma not having achieved remission: Secondary | ICD-10-CM

## 2023-12-25 MED ORDER — POMALIDOMIDE 4 MG PO CAPS: 4.0000 mg | ORAL_CAPSULE | Freq: Every day | ORAL | 0 refills | Status: DC

## 2023-12-25 NOTE — Telephone Encounter (Signed)
 Chart reviewed. Pomalyst  refilled per last office note with Dr. Davonna.

## 2023-12-30 ENCOUNTER — Encounter: Payer: Self-pay | Admitting: Oncology

## 2023-12-31 ENCOUNTER — Ambulatory Visit (HOSPITAL_COMMUNITY)
Admission: RE | Admit: 2023-12-31 | Discharge: 2023-12-31 | Disposition: A | Discharge status: Home / Self Care | Source: Ambulatory Visit | Attending: Family Medicine | Admitting: Family Medicine

## 2023-12-31 ENCOUNTER — Other Ambulatory Visit: Payer: Self-pay | Admitting: *Deleted

## 2023-12-31 ENCOUNTER — Encounter (HOSPITAL_COMMUNITY): Payer: Self-pay

## 2023-12-31 DIAGNOSIS — Z1231 Encounter for screening mammogram for malignant neoplasm of breast: Secondary | ICD-10-CM | POA: Diagnosis present

## 2023-12-31 MED ORDER — METOLAZONE 2.5 MG PO TABS: 2.5000 mg | ORAL_TABLET | ORAL | 4 refills | Status: AC

## 2023-12-31 NOTE — Progress Notes (Incomplete)
 Impression/Assessment:  -History of colovesical fistula, status post colectomy/cystorrhaphy.  Unintended right ureteral injury necessitating right ureteral reimplantation with psoas hitch.  Recent renal ultrasound revealed no evidence of hydronephrosis in either kidney  -Pyuria, asymptomatic  Plan:     History of Present Illness: Kelli Hurley is a 68 y.o. year old female who comes in today for right ureteral stent extraction.  She underwent sigmoid colectomy with an end colostomy on 12.18.2024 for history of diverticulitis with colovesical fistula.  There was an unintended right ureteral injury during that procedure.  Right ureteroneocystostomy and psoas hitch were performed by Dr. Sherrilee.  2.18.2025: Cystoscopy and bilateral stent removal performed.  5.20.2025: Here for follow-up.  She underwent renal ultrasound.  About 3 weeks ago.  This revealed prominent renal pelves bilaterally but no hydronephrosis.  She denies gross hematuria.  She has had no recurrent pneumaturia.  She is not having dysuria.  11.25.2025:  Past Medical History:  Diagnosis Date   Anemia    Arthritis    Asthma    Cancer (HCC)    Claustrophobia 10/05/2014   Diabetes mellitus without complication (HCC)    Dyspnea    Hypertension    Leukopenia 08/03/2014   Normocytic hypochromic anemia 08/03/2014   Renal disorder    stage 3    Sleep apnea     Past Surgical History:  Procedure Laterality Date   ABDOMINAL HYSTERECTOMY     CARDIAC SURGERY     79 months old. States she had a leaky valve.    COLECTOMY WITH COLOSTOMY CREATION/HARTMANN PROCEDURE N/A 01/24/2023   Procedure: EXPLORATORY LAPAROTOMY;  Surgeon: Kallie Manuelita JAYSON, MD;  Location: [facility];  Service: General;  Laterality: N/A;   COLON RESECTION SIGMOID  01/24/2023   Procedure: SIGMOID COLECTOMY WITH END COLOSTOMY;  Surgeon: Kallie Manuelita JAYSON, MD;  Location: [facility];  Service: General;;   COLONOSCOPY  03/26/2009   Dr. Harvey; normal  colon, small internal hemorrhoids.  Recommended repeat colonoscopy in 10 years.   COLONOSCOPY WITH PROPOFOL  N/A 11/25/2019   Procedure: COLONOSCOPY WITH PROPOFOL ;  Surgeon: Cindie Carlin POUR, DO;  Location: [facility];  Service: Endoscopy;  Laterality: N/A;  10:45am   IR PATIENT EVAL TECH 0-60 MINS  09/08/2022   IR URETERAL STENT PLACEMENT EXISTING ACCESS LEFT  11/24/2022   OTHER SURGICAL HISTORY     heart surgery as infant to repair hole in heart   PORTACATH PLACEMENT Left 02/21/2019   Procedure: INSERTION PORT-A-CATH;  Surgeon: Kallie Manuelita JAYSON, MD;  Location: [facility];  Service: General;  Laterality: Left;    Home Medications:  (Not in a hospital admission)   Allergies:  Allergies  Allergen Reactions   Diclofenac  Swelling    Per pt, facial swelling  Other Reaction(s): Other (See Comments)  Per pt, facial swelling, Per pt, facial swelling    Family History  Problem Relation Age of Onset   Cancer Mother    Hypertension Mother    Cancer Father    Hypertension Father    Hypertension Sister    Hypertension Sister    Hypertension Sister    Hypertension Sister    Cancer Maternal Grandmother    Cancer Cousin    Hypertension Brother    Prostate cancer Brother    Hypertension Brother    Hypertension Brother    Colon cancer Neg Hx    Colon polyps Neg Hx     Social History:  reports that she has never smoked. She has never used  smokeless tobacco. She reports that she does not drink alcohol and does not use drugs.  ROS: A complete review of systems was performed.  All systems are negative except for pertinent findings as noted.  Physical Exam:  Vital signs in last 24 hours: General:  Alert and oriented, No acute distress HEENT: Normocephalic, atraumatic Neck: No JVD or lymphadenopathy Cardiovascular: Regular rate  Lungs: Normal inspiratory/expiratory excursion Neurologic: Grossly intact  I have reviewed prior pt op notes  I have reviewed urinalysis  results--pyuria and noted  I have independently reviewed prior imaging--most recent renal ultrasound

## 2024-01-01 ENCOUNTER — Ambulatory Visit: Admitting: Urology

## 2024-01-01 VITALS — BP 107/68 | HR 103

## 2024-01-01 DIAGNOSIS — R8281 Pyuria: Secondary | ICD-10-CM

## 2024-01-01 DIAGNOSIS — N131 Hydronephrosis with ureteral stricture, not elsewhere classified: Secondary | ICD-10-CM

## 2024-01-01 DIAGNOSIS — S3710XD Unspecified injury of ureter, subsequent encounter: Secondary | ICD-10-CM | POA: Diagnosis not present

## 2024-01-01 DIAGNOSIS — N1330 Unspecified hydronephrosis: Secondary | ICD-10-CM | POA: Diagnosis not present

## 2024-01-01 NOTE — Progress Notes (Signed)
 Impression/Assessment:  -History of colovesical fistula, status post colectomy/cystorrhaphy.  Unintended right ureteral injury necessitating right ureteral reimplantation with psoas hitch.  Recent renal ultrasound revealed hydronephrosis, but to me this renal ultrasound is the same as that done in April of this year.  -Pyuria, asymptomatic  Plan:  -I do not think she needs any further imaging.  Renal appearance is stable  - I will have her come back in 1 year to see Dr. Sherrilee, who performed her surgery.   History of Present Illness:   She underwent sigmoid colectomy with an end colostomy on 12.18.2024 for history of diverticulitis with colovesical fistula.  There was an unintended right ureteral injury during that procedure.  Right ureteroneocystostomy and psoas hitch were performed by Dr. Sherrilee.  2.18.2025: Cystoscopy and bilateral stent removal performed.  5.20.2025: Here for follow-up.  She underwent renal ultrasound.  About 3 weeks ago.  This revealed prominent renal pelves bilaterally but no hydronephrosis.  She denies gross hematuria.  She has had no recurrent pneumaturia.  She is not having dysuria.  11.25.2025: Recent renal U/S-  1. Mild right-sided hydronephrosis. 2. Left superior pole simple renal cyst measuring 8 x 6 x 8 mm. 3. Bladder underdistended, limiting evaluation.  She has had no infections or flank pain since her last visit.   Past Medical History:  Diagnosis Date   Anemia    Arthritis    Asthma    Cancer (HCC)    Claustrophobia 10/05/2014   Diabetes mellitus without complication (HCC)    Dyspnea    Hypertension    Leukopenia 08/03/2014   Normocytic hypochromic anemia 08/03/2014   Renal disorder    stage 3    Sleep apnea     Past Surgical History:  Procedure Laterality Date   ABDOMINAL HYSTERECTOMY     CARDIAC SURGERY     50 months old. States she had a leaky valve.    COLECTOMY WITH COLOSTOMY CREATION/HARTMANN PROCEDURE N/A  01/24/2023   Procedure: EXPLORATORY LAPAROTOMY;  Surgeon: Kallie Manuelita BROCKS, MD;  Location: [facility];  Service: General;  Laterality: N/A;   COLON RESECTION SIGMOID  01/24/2023   Procedure: SIGMOID COLECTOMY WITH END COLOSTOMY;  Surgeon: Kallie Manuelita BROCKS, MD;  Location: [facility];  Service: General;;   COLONOSCOPY  03/26/2009   Dr. Harvey; normal colon, small internal hemorrhoids.  Recommended repeat colonoscopy in 10 years.   COLONOSCOPY WITH PROPOFOL  N/A 11/25/2019   Procedure: COLONOSCOPY WITH PROPOFOL ;  Surgeon: Cindie Carlin POUR, DO;  Location: [facility];  Service: Endoscopy;  Laterality: N/A;  10:45am   IR PATIENT EVAL TECH 0-60 MINS  09/08/2022   IR URETERAL STENT PLACEMENT EXISTING ACCESS LEFT  11/24/2022   OTHER SURGICAL HISTORY     heart surgery as infant to repair hole in heart   PORTACATH PLACEMENT Left 02/21/2019   Procedure: INSERTION PORT-A-CATH;  Surgeon: Kallie Manuelita BROCKS, MD;  Location: [facility];  Service: General;  Laterality: Left;    Home Medications:  (Not in a hospital admission)   Allergies:  Allergies  Allergen Reactions   Diclofenac  Swelling    Per pt, facial swelling  Other Reaction(s): Other (See Comments)  Per pt, facial swelling, Per pt, facial swelling    Family History  Problem Relation Age of Onset   Cancer Mother    Hypertension Mother    Cancer Father    Hypertension Father    Hypertension Sister    Hypertension Sister    Hypertension Sister  Hypertension Sister    Cancer Maternal Grandmother    Cancer Cousin    Hypertension Brother    Prostate cancer Brother    Hypertension Brother    Hypertension Brother    Colon cancer Neg Hx    Colon polyps Neg Hx     Social History:  reports that she has never smoked. She has never used smokeless tobacco. She reports that she does not drink alcohol and does not use drugs.  ROS: A complete review of systems was performed.  All systems are negative except for pertinent findings as  noted.  Physical Exam:  Vital signs in last 24 hours: General:  Alert and oriented, No acute distress HEENT: Normocephalic, atraumatic Neck: No JVD or lymphadenopathy Cardiovascular: Regular rate  Lungs: Normal inspiratory/expiratory excursion Neurologic: Grossly intact  I have reviewed prior pt op notes  I have reviewed urinalysis results--area present again  I have independently reviewed prior imaging--most recent renal ultrasound images as well as prior images reviewed with patient and daughter

## 2024-01-02 ENCOUNTER — Other Ambulatory Visit: Payer: Self-pay

## 2024-01-02 DIAGNOSIS — G8929 Other chronic pain: Secondary | ICD-10-CM

## 2024-01-02 MED ORDER — OXYCODONE-ACETAMINOPHEN 5-325 MG PO TABS: 1.0000 | ORAL_TABLET | Freq: Two times a day (BID) | ORAL | 0 refills | Status: DC | PRN

## 2024-01-18 ENCOUNTER — Ambulatory Visit (HOSPITAL_COMMUNITY): Admitting: Occupational Therapy

## 2024-01-22 ENCOUNTER — Other Ambulatory Visit: Payer: Self-pay

## 2024-01-22 DIAGNOSIS — C9000 Multiple myeloma not having achieved remission: Secondary | ICD-10-CM

## 2024-01-22 MED ORDER — POMALIDOMIDE 4 MG PO CAPS: 4.0000 mg | ORAL_CAPSULE | Freq: Every day | ORAL | 0 refills | Status: DC

## 2024-01-22 NOTE — Telephone Encounter (Signed)
 Chart reviewed. Pomalyst  refilled per last office note with Dr. Davonna.

## 2024-01-29 ENCOUNTER — Ambulatory Visit: Admitting: Gastroenterology

## 2024-02-04 ENCOUNTER — Ambulatory Visit (HOSPITAL_COMMUNITY): Attending: Family Medicine | Admitting: Occupational Therapy

## 2024-02-04 ENCOUNTER — Other Ambulatory Visit: Payer: Self-pay

## 2024-02-04 ENCOUNTER — Encounter (HOSPITAL_COMMUNITY): Payer: Self-pay | Admitting: Occupational Therapy

## 2024-02-04 ENCOUNTER — Encounter: Payer: Self-pay | Admitting: *Deleted

## 2024-02-04 DIAGNOSIS — M25512 Pain in left shoulder: Secondary | ICD-10-CM | POA: Diagnosis present

## 2024-02-04 DIAGNOSIS — R29898 Other symptoms and signs involving the musculoskeletal system: Secondary | ICD-10-CM | POA: Insufficient documentation

## 2024-02-04 DIAGNOSIS — M25612 Stiffness of left shoulder, not elsewhere classified: Secondary | ICD-10-CM | POA: Insufficient documentation

## 2024-02-04 DIAGNOSIS — G8929 Other chronic pain: Secondary | ICD-10-CM

## 2024-02-04 DIAGNOSIS — M25511 Pain in right shoulder: Secondary | ICD-10-CM | POA: Diagnosis present

## 2024-02-04 DIAGNOSIS — M25611 Stiffness of right shoulder, not elsewhere classified: Secondary | ICD-10-CM | POA: Diagnosis present

## 2024-02-04 MED ORDER — OXYCODONE-ACETAMINOPHEN 5-325 MG PO TABS: 1.0000 | ORAL_TABLET | Freq: Two times a day (BID) | ORAL | 0 refills | Status: DC | PRN

## 2024-02-04 NOTE — Patient Instructions (Addendum)
 1) SHOULDER: Flexion On Table   Place hands on towel placed on table, elbows straight. Lean forward with you upper body, pushing towel away from body.  Repeat 10 times. Do 2-3 sessions per day.  2) Abduction (Passive)   With arm out to side, resting on towel placed on table with palm DOWN, keeping trunk away from table, lean to the side while pushing towel away from body.  Repeat 10 times. Do 2-3 sessions per day.   3) Seated Row   Sit up straight with elbows by your sides. Pull back with shoulders/elbows, keeping forearms straight, as if pulling back on the reins of a horse. Squeeze shoulder blades together. Repeat _10-15__times, __2-3__sets/day    4) Shoulder Elevation    Sit up straight with arms by your sides. Slowly bring your shoulders up towards your ears. Repeat_10-15__times, __2-3__ sets/day    4) Shoulder Extension    Sit up straight with both arms by your side, draw your arms back behind your waist. Keep your elbows straight. Repeat __10-15__times, __2-3__sets/day.

## 2024-02-04 NOTE — Therapy (Signed)
 " OUTPATIENT OCCUPATIONAL THERAPY ORTHO EVALUATION  Patient Name: Kelli Hurley MRN: [id number] DOB:Apr 26, 1955, 68 y.o., female Today's Date: 02/04/2024   END OF SESSION:  OT End of Session - 02/04/24 1335     Visit Number 1    Number of Visits 12    Date for Recertification  03/17/24    Authorization Type [employer] Medicare-dual complete    Authorization Time Period no auth required    Progress Note Due on Visit 10    OT Start Time 1259    OT Stop Time 1332    OT Time Calculation (min) 33 min    Activity Tolerance Patient tolerated treatment well    Behavior During Therapy WFL for tasks assessed/performed          Past Medical History:  Diagnosis Date   Anemia    Arthritis    Asthma    Cancer (HCC)    Claustrophobia 10/05/2014   Diabetes mellitus without complication (HCC)    Dyspnea    Hypertension    Leukopenia 08/03/2014   Normocytic hypochromic anemia 08/03/2014   Renal disorder    stage 3    Sleep apnea    Past Surgical History:  Procedure Laterality Date   ABDOMINAL HYSTERECTOMY     CARDIAC SURGERY     75 months old. States she had a leaky valve.    COLECTOMY WITH COLOSTOMY CREATION/HARTMANN PROCEDURE N/A 01/24/2023   Procedure: EXPLORATORY LAPAROTOMY;  Surgeon: Kallie Manuelita JAYSON, MD;  Location: [facility];  Service: General;  Laterality: N/A;   COLON RESECTION SIGMOID  01/24/2023   Procedure: SIGMOID COLECTOMY WITH END COLOSTOMY;  Surgeon: Kallie Manuelita JAYSON, MD;  Location: [facility];  Service: General;;   COLONOSCOPY  03/26/2009   Dr. Harvey; normal colon, small internal hemorrhoids.  Recommended repeat colonoscopy in 10 years.   COLONOSCOPY WITH PROPOFOL  N/A 11/25/2019   Procedure: COLONOSCOPY WITH PROPOFOL ;  Surgeon: Cindie Carlin POUR, DO;  Location: [facility];  Service: Endoscopy;  Laterality: N/A;  10:45am   IR PATIENT EVAL TECH 0-60 MINS  09/08/2022   IR URETERAL STENT PLACEMENT EXISTING ACCESS LEFT  11/24/2022   OTHER SURGICAL HISTORY     heart  surgery as infant to repair hole in heart   PORTACATH PLACEMENT Left 02/21/2019   Procedure: INSERTION PORT-A-CATH;  Surgeon: Kallie Manuelita JAYSON, MD;  Location: [facility];  Service: General;  Laterality: Left;   Patient Active Problem List   Diagnosis Date Noted   Colostomy complication (HCC) 02/19/2023   Irritant contact dermatitis associated with fecal stoma 02/07/2023   Colostomy care (HCC) 02/07/2023   Right ureteral injury 01/27/2023   Acute postoperative anemia due to expected blood loss 01/26/2023   Acute renal failure superimposed on stage 4 chronic kidney disease (HCC) 01/26/2023   Paroxysmal atrial flutter (HCC) 01/02/2023   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   Hydronephrosis due to obstruction of ureter 09/05/2022   Lactic acidosis 09/03/2022   Hyponatremia 09/03/2022   Hypoalbuminemia due to protein-calorie malnutrition 09/03/2022   Hypokalemia 09/03/2022   Mixed hyperlipidemia 09/03/2022   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   S/P hysterectomy with oophorectomy 08/16/2022   Vaginal discharge 08/16/2022   OSA (obstructive sleep apnea) 04/07/2021   Heart failure with preserved ejection fraction (HCC) 12/09/2020   CKD (chronic kidney disease) stage 3, GFR 30-59 ml/min (HCC) 07/28/2020   Anemia in neoplastic disease 07/28/2020   Essential hypertension 05/12/2020   Nocturnal hypoxemia 09/02/2019   Morbid obesity with BMI of 50.0-59.9, adult (HCC) 06/28/2019  SOB (shortness of breath) 06/27/2019   Goals of care, counseling/discussion 02/25/2019   H/O autologous stem cell transplant (HCC) 05/13/2015   Claustrophobia 10/05/2014   Multiple myeloma (HCC) 09/23/2014   IDA (iron deficiency anemia) 08/03/2014   Leukopenia 08/03/2014   Primary osteoarthritis of both knees 11/18/2013    PCP: Mariano Lindau, DO  REFERRING PROVIDER: Dr. Ozell Flake  ONSET DATE: ~1 year  REFERRING DIAG:  M75.01 (ICD-10-CM) - Adhesive capsulitis of right shoulder  M67.911 (ICD-10-CM) -  Unspecified disorder of synovium and tendon, right shoulder  M67.912 (ICD-10-CM) - Unspecified disorder of synovium and tendon, left shoulder  R20.0,R20.2 (ICD-10-CM) - Numbness and tingling of both upper extremities    THERAPY DIAG:  Chronic left shoulder pain  Chronic right shoulder pain  Other symptoms and signs involving the musculoskeletal system  Stiffness of right shoulder, not elsewhere classified  Stiffness of left shoulder, not elsewhere classified  Rationale for Evaluation and Treatment: Rehabilitation  SUBJECTIVE:   SUBJECTIVE STATEMENT: S: I got shots twice in my shoulders but it didn't help Pt accompanied by: self  PERTINENT HISTORY: Pt reports chronic shoulder pain and weakness that has been present for about a year, maybe more. Pt has received injections twice without relief. Pt with PMH of CVA.   PRECAUTIONS: Fall  WEIGHT BEARING RESTRICTIONS: No  PAIN:  Are you having pain? Yes: NPRS scale: 8/10  Pain location: bilateral shoulders Pain description: aching Aggravating factors: rainy weather, reaching Relieving factors: resting by her sides  FALLS: Has patient fallen in last 6 months? No  PLOF: Needs assistance with ADLs and Needs assistance with gait  PATIENT GOALS: To have less pain and more mobility  NEXT MD VISIT: none scheduled  OBJECTIVE:   HAND DOMINANCE: Right  ADLs: Overall ADLs: Pt reports difficulty with reaching overhead, in front of her, and behind her back. Pt has difficulty with dressing, bathing, grooming-fixing hair. Pt is unable to lift and hold weighted objects. Pt has some difficulty with pushing up on her wheelchair to adjust her position in her wheelchair and when holding/pushing her walker.   FUNCTIONAL OUTCOME MEASURES: Quick Dash:  QUICK DASH  Please rate your ability do the following activities in the last week by selecting the number below the appropriate response.   Activities Rating  Open a tight or new jar.  4  = Severe difficulty  Do heavy household chores (e.g., wash walls, floors). 4 = Severe difficulty  Carry a shopping bag or briefcase 5 = Unable  Wash your back. 3 = Moderate difficulty  Use a knife to cut food. 1 = No difficulty   Recreational activities in which you take some force or impact through your arm, shoulder or hand (e.g., golf, hammering, tennis, etc.). 4 = Severe difficulty  During the past week, to what extent has your arm, shoulder or hand problem interfered with your normal social activities with family, friends, neighbors or groups?  4 = Quite a bit  During the past week, were you limited in your work or other regular daily activities as a result of your arm, shoulder or hand problem? 4 = Very limited  Rate the severity of the following symptoms in the last week: Arm, Shoulder, or hand pain. 5 = Extreme  Rate the severity of the following symptoms in the last week: Tingling (pins and needles) in your arm, shoulder or hand. 1 = none  During the past week, how much difficulty have you had sleeping because of the pain in your  arm, shoulder or hand?  3 = Moderate difficulty   (A QuickDASH score may not be calculated if there is greater than 1 missing item.)  Quick Dash Disability/Symptom Score: [(sum of 38 (n) responses/11 (n)] -1x 25 = 61.37  Minimally Clinically Important Difference (MCID): 15-20 points  (Franchignoni, F. et al. (2013). Minimally clinically important difference of the disabilities of the arm, shoulder, and hand outcome measures (DASH) and its shortened version (Quick DASH). Journal of Orthopaedic & Sports Physical Therapy, 44(1), 30-39)    UPPER EXTREMITY ROM:       Assessed in sitting, er/IR adducted  Active ROM Right eval Left eval  Shoulder flexion 82 86  Shoulder abduction 78 77  Shoulder internal rotation 90 90  Shoulder external rotation 10 0  (Blank rows = not tested)    UPPER EXTREMITY MMT:     Assessed in sitting, er/IR adducted  MMT  Right eval Left eval  Shoulder flexion 3-/5 3-/5  Shoulder abduction 3-/5 3-/5  Shoulder internal rotation 3/5 3/5  Shoulder external rotation 3-/5 3-/5  (Blank rows = not tested)  HAND FUNCTION: Grip strength: Right: 70 lbs; Left: 55 lbs  SENSATION: WFL  EDEMA: None  COGNITION: Overall cognitive status: Within functional limits for tasks assessed  OBSERVATIONS: Max fascial restrictions in bilateral trapezius. Pt with crepitus noted in bilateral shoulders. Pt with more motion in the RUE passively, LUE with hard end feel and pain experienced.      TODAY'S TREATMENT:                                                                                                                              DATE:  Eval -Scapular A/ROM: sitting-row, extension, elevation/depression, 10 reps -Table slides: sitting-flexion, abduction 5 reps   PATIENT EDUCATION: Education details: Table slides, scapular A/ROM Person educated: Patient Education method: Explanation, Demonstration, and Handouts Education comprehension: verbalized understanding and returned demonstration  HOME EXERCISE PROGRAM: Eval: Table slides, scapular A/ROM  GOALS: Goals reviewed with patient? Yes   SHORT TERM GOALS: Target date: 02/25/24  Pt will be provided with and educated on HEP to improve mobility in BUE required for use during ADL completion.   Goal status: INITIAL  2.  Pt will increase BUE A/ROM by 10 degrees to improve ability to use BUE during dressing tasks with minimal compensatory techniques.   Goal status: INITIAL    LONG TERM GOALS: Target date: 03/17/24  Pt will decrease pain in BUE to 5/10 or less to improve ability to sleep for 2+ consecutive hours without waking due to pain.   Goal status: INITIAL  2.  Pt will decrease BUE fascial restrictions to min amounts or less to improve mobility required for functional reaching tasks.   Goal status: INITIAL  3.  Pt will increase BUE A/ROM by 20  degrees to improve ability to use BUE when reaching overhead or behind back during dressing and bathing tasks.   Goal status: INITIAL  4.  Pt will increase BUE strength to 4-/5 or greater to improve ability to use BUE when lifting items during meal preparation/housework/yardwork tasks.   Goal status: INITIAL   ASSESSMENT:  CLINICAL IMPRESSION: Patient is a 68 y.o. female who was seen today for occupational therapy evaluation for bilateral shoulder pain and tightness. Pt with more deficits in the RUE due to adhesive capsulitis.  Pt presents with increased pain and fascial restrictions, decreased ROM, strength, and functional use of BUE. Pt demonstrates significant difficulty with any functional reaching tasks required for self-care completion. Pt will benefit from skilled OT services to improve functional use of BUE.    PERFORMANCE DEFICITS: in functional skills including in functional skills including ADLs, IADLs, coordination, tone, ROM, strength, pain, fascial restrictions, muscle spasms, and UE functional use  IMPAIRMENTS: are limiting patient from ADLs, IADLs, rest and sleep, and leisure.   COMORBIDITIES: may have co-morbidities  that affects occupational performance. Patient will benefit from skilled OT to address above impairments and improve overall function.  MODIFICATION OR ASSISTANCE TO COMPLETE EVALUATION: Min-Moderate modification of tasks or assist with assess necessary to complete an evaluation.  OT OCCUPATIONAL PROFILE AND HISTORY: Detailed assessment: Review of records and additional review of physical, cognitive, psychosocial history related to current functional performance.  CLINICAL DECISION MAKING: LOW - limited treatment options, no task modification necessary  REHAB POTENTIAL: Fair significance of deficits  EVALUATION COMPLEXITY: Low      PLAN:  OT FREQUENCY: 2x/week  OT DURATION: 6 weeks  PLANNED INTERVENTIONS: 97168 OT Re-evaluation, 97535 self  care/ADL training, 02889 therapeutic exercise, 97530 therapeutic activity, 97112 neuromuscular re-education, 97140 manual therapy, 97014 electrical stimulation unattended, patient/family education, and DME and/or AE instructions  RECOMMENDED OTHER SERVICES: None at this time  CONSULTED AND AGREED WITH PLAN OF CARE: Patient  PLAN FOR NEXT SESSION: Follow up on HEP, manual techniques, supine ROM tasks   Sonny Cory, OTR/L  [phone] 02/04/2024, 1:35 PM   "

## 2024-02-11 ENCOUNTER — Ambulatory Visit (HOSPITAL_COMMUNITY): Attending: Family Medicine | Admitting: Occupational Therapy

## 2024-02-14 ENCOUNTER — Ambulatory Visit: Admitting: Internal Medicine

## 2024-02-18 ENCOUNTER — Other Ambulatory Visit: Payer: Self-pay

## 2024-02-18 ENCOUNTER — Encounter (HOSPITAL_COMMUNITY): Payer: Self-pay | Admitting: Occupational Therapy

## 2024-02-18 ENCOUNTER — Ambulatory Visit (HOSPITAL_COMMUNITY): Attending: Family Medicine | Admitting: Occupational Therapy

## 2024-02-18 DIAGNOSIS — M25611 Stiffness of right shoulder, not elsewhere classified: Secondary | ICD-10-CM | POA: Insufficient documentation

## 2024-02-18 DIAGNOSIS — M25512 Pain in left shoulder: Secondary | ICD-10-CM | POA: Insufficient documentation

## 2024-02-18 DIAGNOSIS — R29898 Other symptoms and signs involving the musculoskeletal system: Secondary | ICD-10-CM | POA: Diagnosis present

## 2024-02-18 DIAGNOSIS — M25511 Pain in right shoulder: Secondary | ICD-10-CM | POA: Insufficient documentation

## 2024-02-18 DIAGNOSIS — M25612 Stiffness of left shoulder, not elsewhere classified: Secondary | ICD-10-CM | POA: Insufficient documentation

## 2024-02-18 DIAGNOSIS — G8929 Other chronic pain: Secondary | ICD-10-CM | POA: Diagnosis present

## 2024-02-18 DIAGNOSIS — C9000 Multiple myeloma not having achieved remission: Secondary | ICD-10-CM

## 2024-02-18 MED ORDER — POMALIDOMIDE 4 MG PO CAPS: 4.0000 mg | ORAL_CAPSULE | Freq: Every day | ORAL | 0 refills | Status: AC

## 2024-02-18 NOTE — Patient Instructions (Signed)

## 2024-02-18 NOTE — Telephone Encounter (Signed)
 Chart reviewed. Pomalyst  refilled per last office note with Dr. Davonna.

## 2024-02-18 NOTE — Therapy (Unsigned)
 " OUTPATIENT OCCUPATIONAL THERAPY ORTHO TREATMENT  Patient Name: Kelli Hurley MRN: [id number] DOB:August 01, 1955, 69 y.o., female Today's Date: 02/19/2024   END OF SESSION:  OT End of Session - 02/18/24 1705     Visit Number 2    Number of Visits 12    Date for Recertification  03/17/24    Authorization Type [employer] Medicare-dual complete    Authorization Time Period no auth required    Progress Note Due on Visit 10    OT Start Time 1622    OT Stop Time 1705    OT Time Calculation (min) 43 min    Activity Tolerance Patient tolerated treatment well    Behavior During Therapy WFL for tasks assessed/performed          Past Medical History:  Diagnosis Date   Anemia    Arthritis    Asthma    Cancer (HCC)    Claustrophobia 10/05/2014   Diabetes mellitus without complication (HCC)    Dyspnea    Hypertension    Leukopenia 08/03/2014   Normocytic hypochromic anemia 08/03/2014   Renal disorder    stage 3    Sleep apnea    Past Surgical History:  Procedure Laterality Date   ABDOMINAL HYSTERECTOMY     CARDIAC SURGERY     39 months old. States she had a leaky valve.    COLECTOMY WITH COLOSTOMY CREATION/HARTMANN PROCEDURE N/A 01/24/2023   Procedure: EXPLORATORY LAPAROTOMY;  Surgeon: Kallie Manuelita JAYSON, MD;  Location: [facility];  Service: General;  Laterality: N/A;   COLON RESECTION SIGMOID  01/24/2023   Procedure: SIGMOID COLECTOMY WITH END COLOSTOMY;  Surgeon: Kallie Manuelita JAYSON, MD;  Location: [facility];  Service: General;;   COLONOSCOPY  03/26/2009   Dr. Harvey; normal colon, small internal hemorrhoids.  Recommended repeat colonoscopy in 10 years.   COLONOSCOPY WITH PROPOFOL  N/A 11/25/2019   Procedure: COLONOSCOPY WITH PROPOFOL ;  Surgeon: Cindie Carlin POUR, DO;  Location: [facility];  Service: Endoscopy;  Laterality: N/A;  10:45am   IR PATIENT EVAL TECH 0-60 MINS  09/08/2022   IR URETERAL STENT PLACEMENT EXISTING ACCESS LEFT  11/24/2022   OTHER SURGICAL HISTORY     heart  surgery as infant to repair hole in heart   PORTACATH PLACEMENT Left 02/21/2019   Procedure: INSERTION PORT-A-CATH;  Surgeon: Kallie Manuelita JAYSON, MD;  Location: [facility];  Service: General;  Laterality: Left;   Patient Active Problem List   Diagnosis Date Noted   Colostomy complication (HCC) 02/19/2023   Irritant contact dermatitis associated with fecal stoma 02/07/2023   Colostomy care (HCC) 02/07/2023   Right ureteral injury 01/27/2023   Acute postoperative anemia due to expected blood loss 01/26/2023   Acute renal failure superimposed on stage 4 chronic kidney disease (HCC) 01/26/2023   Paroxysmal atrial flutter (HCC) 01/02/2023   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   Hydronephrosis due to obstruction of ureter 09/05/2022   Lactic acidosis 09/03/2022   Hyponatremia 09/03/2022   Hypoalbuminemia due to protein-calorie malnutrition 09/03/2022   Hypokalemia 09/03/2022   Mixed hyperlipidemia 09/03/2022   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   S/P hysterectomy with oophorectomy 08/16/2022   Vaginal discharge 08/16/2022   OSA (obstructive sleep apnea) 04/07/2021   Heart failure with preserved ejection fraction (HCC) 12/09/2020   CKD (chronic kidney disease) stage 3, GFR 30-59 ml/min (HCC) 07/28/2020   Anemia in neoplastic disease 07/28/2020   Essential hypertension 05/12/2020   Nocturnal hypoxemia 09/02/2019   Morbid obesity with BMI of 50.0-59.9, adult (HCC) 06/28/2019  SOB (shortness of breath) 06/27/2019   Goals of care, counseling/discussion 02/25/2019   H/O autologous stem cell transplant (HCC) 05/13/2015   Claustrophobia 10/05/2014   Multiple myeloma (HCC) 09/23/2014   IDA (iron deficiency anemia) 08/03/2014   Leukopenia 08/03/2014   Primary osteoarthritis of both knees 11/18/2013    PCP: Mariano Lindau, DO  REFERRING PROVIDER: Dr. Ozell Flake  ONSET DATE: ~1 year  REFERRING DIAG:  M75.01 (ICD-10-CM) - Adhesive capsulitis of right shoulder  M67.911 (ICD-10-CM) -  Unspecified disorder of synovium and tendon, right shoulder  M67.912 (ICD-10-CM) - Unspecified disorder of synovium and tendon, left shoulder  R20.0,R20.2 (ICD-10-CM) - Numbness and tingling of both upper extremities    THERAPY DIAG:  Chronic left shoulder pain  Chronic right shoulder pain  Other symptoms and signs involving the musculoskeletal system  Stiffness of right shoulder, not elsewhere classified  Stiffness of left shoulder, not elsewhere classified  Rationale for Evaluation and Treatment: Rehabilitation  SUBJECTIVE:   SUBJECTIVE STATEMENT: S: It's slowly getting better Pt accompanied by: self  PERTINENT HISTORY: Pt reports chronic shoulder pain and weakness that has been present for about a year, maybe more. Pt has received injections twice without relief. Pt with PMH of CVA.   PRECAUTIONS: Fall  WEIGHT BEARING RESTRICTIONS: No  PAIN:  Are you having pain? Yes: NPRS scale: 8/10  Pain location: bilateral shoulders Pain description: aching Aggravating factors: rainy weather, reaching Relieving factors: resting by her sides  FALLS: Has patient fallen in last 6 months? No  PLOF: Needs assistance with ADLs and Needs assistance with gait  PATIENT GOALS: To have less pain and more mobility  NEXT MD VISIT: none scheduled  OBJECTIVE:   HAND DOMINANCE: Right  ADLs: Overall ADLs: Pt reports difficulty with reaching overhead, in front of her, and behind her back. Pt has difficulty with dressing, bathing, grooming-fixing hair. Pt is unable to lift and hold weighted objects. Pt has some difficulty with pushing up on her wheelchair to adjust her position in her wheelchair and when holding/pushing her walker.   FUNCTIONAL OUTCOME MEASURES: Quick Dash:  QUICK DASH  Please rate your ability do the following activities in the last week by selecting the number below the appropriate response.   Activities Rating  Open a tight or new jar.  4 = Severe difficulty  Do  heavy household chores (e.g., wash walls, floors). 4 = Severe difficulty  Carry a shopping bag or briefcase 5 = Unable  Wash your back. 3 = Moderate difficulty  Use a knife to cut food. 1 = No difficulty   Recreational activities in which you take some force or impact through your arm, shoulder or hand (e.g., golf, hammering, tennis, etc.). 4 = Severe difficulty  During the past week, to what extent has your arm, shoulder or hand problem interfered with your normal social activities with family, friends, neighbors or groups?  4 = Quite a bit  During the past week, were you limited in your work or other regular daily activities as a result of your arm, shoulder or hand problem? 4 = Very limited  Rate the severity of the following symptoms in the last week: Arm, Shoulder, or hand pain. 5 = Extreme  Rate the severity of the following symptoms in the last week: Tingling (pins and needles) in your arm, shoulder or hand. 1 = none  During the past week, how much difficulty have you had sleeping because of the pain in your arm, shoulder or hand?  3 =  Moderate difficulty   (A QuickDASH score may not be calculated if there is greater than 1 missing item.)  Quick Dash Disability/Symptom Score: [(sum of 38 (n) responses/11 (n)] -1x 25 = 61.37  Minimally Clinically Important Difference (MCID): 15-20 points  (Franchignoni, F. et al. (2013). Minimally clinically important difference of the disabilities of the arm, shoulder, and hand outcome measures (DASH) and its shortened version (Quick DASH). Journal of Orthopaedic & Sports Physical Therapy, 44(1), 30-39)    UPPER EXTREMITY ROM:       Assessed in sitting, er/IR adducted  Active ROM Right eval Left eval  Shoulder flexion 82 86  Shoulder abduction 78 77  Shoulder internal rotation 90 90  Shoulder external rotation 10 0  (Blank rows = not tested)    UPPER EXTREMITY MMT:     Assessed in sitting, er/IR adducted  MMT Right eval Left eval   Shoulder flexion 3-/5 3-/5  Shoulder abduction 3-/5 3-/5  Shoulder internal rotation 3/5 3/5  Shoulder external rotation 3-/5 3-/5  (Blank rows = not tested)  HAND FUNCTION: Grip strength: Right: 70 lbs; Left: 55 lbs  SENSATION: WFL  EDEMA: None  COGNITION: Overall cognitive status: Within functional limits for tasks assessed  OBSERVATIONS: Max fascial restrictions in bilateral trapezius. Pt with crepitus noted in bilateral shoulders. Pt with more motion in the RUE passively, LUE with hard end feel and pain experienced.      TODAY'S TREATMENT:                                                                                                                              DATE:   02/18/24 -Manual Therapy: myofascial release and trigger point applied to biceps, trapezius, and scapular region in order to reduce fascial restrictions and pain, as well as improve ROM.  -AA/ROM: sitting - protraction, flexion, abduction, horizontal abduction, er/IR, x10 BUE -Scapular A/ROM: sitting-row, extension, elevation/depression, 10 reps -Table slides: flexion, abduction,er,  x10  Eval -Scapular A/ROM: sitting-row, extension, elevation/depression, 10 reps -Table slides: sitting-flexion, abduction 5 reps   PATIENT EDUCATION: Education details: AA/ROM Person educated: Patient Education method: Programmer, Multimedia, Facilities Manager, and Handouts Education comprehension: verbalized understanding and returned demonstration  HOME EXERCISE PROGRAM: Eval: Table slides, scapular A/ROM 1/12: AA/ROM  GOALS: Goals reviewed with patient? Yes   SHORT TERM GOALS: Target date: 02/25/24  Pt will be provided with and educated on HEP to improve mobility in BUE required for use during ADL completion.   Goal status: IN PROGRESS  2.  Pt will increase BUE A/ROM by 10 degrees to improve ability to use BUE during dressing tasks with minimal compensatory techniques.   Goal status: IN PROGRESS    LONG TERM GOALS:  Target date: 03/17/24  Pt will decrease pain in BUE to 5/10 or less to improve ability to sleep for 2+ consecutive hours without waking due to pain.   Goal status: IN PROGRESS  2.  Pt will decrease BUE fascial restrictions to  min amounts or less to improve mobility required for functional reaching tasks.   Goal status: IN PROGRESS  3.  Pt will increase BUE A/ROM by 20 degrees to improve ability to use BUE when reaching overhead or behind back during dressing and bathing tasks.   Goal status: IN PROGRESS  4.  Pt will increase BUE strength to 4-/5 or greater to improve ability to use BUE when lifting items during meal preparation/housework/yardwork tasks.   Goal status: IN PROGRESS   ASSESSMENT:  CLINICAL IMPRESSION: This session pt continued to report high pain levels in both shoulders, however she was able to achieve 80% of full ROM with active assistance in all directions. Her pain maintained throughout the session, however did not worsen. When attempting to lift her arms at the end of the session she was unable to lift past 70* due to weakness. OT providing verbal and tactile cuing for positioning and technique throughout session.   PERFORMANCE DEFICITS: in functional skills including in functional skills including ADLs, IADLs, coordination, tone, ROM, strength, pain, fascial restrictions, muscle spasms, and UE functional use   PLAN:  OT FREQUENCY: 2x/week  OT DURATION: 6 weeks  PLANNED INTERVENTIONS: 97168 OT Re-evaluation, 97535 self care/ADL training, 02889 therapeutic exercise, 97530 therapeutic activity, 97112 neuromuscular re-education, 97140 manual therapy, 97014 electrical stimulation unattended, patient/family education, and DME and/or AE instructions  RECOMMENDED OTHER SERVICES: None at this time  CONSULTED AND AGREED WITH PLAN OF CARE: Patient  PLAN FOR NEXT SESSION: Follow up on HEP, manual techniques, supine ROM tasks   Sonny Cory, OTR/L   [phone] 02/19/2024, 8:11 AM   "

## 2024-02-25 ENCOUNTER — Other Ambulatory Visit: Payer: Self-pay

## 2024-02-25 DIAGNOSIS — Z95828 Presence of other vascular implants and grafts: Secondary | ICD-10-CM

## 2024-02-25 DIAGNOSIS — C9000 Multiple myeloma not having achieved remission: Secondary | ICD-10-CM

## 2024-02-25 MED ORDER — LIDOCAINE-PRILOCAINE 2.5-2.5 % EX CREA: TOPICAL_CREAM | CUTANEOUS | 10 refills | Status: AC

## 2024-02-27 ENCOUNTER — Other Ambulatory Visit: Payer: Self-pay

## 2024-02-27 DIAGNOSIS — C9000 Multiple myeloma not having achieved remission: Secondary | ICD-10-CM

## 2024-02-27 DIAGNOSIS — G8929 Other chronic pain: Secondary | ICD-10-CM

## 2024-02-27 MED ORDER — OXYCODONE-ACETAMINOPHEN 5-325 MG PO TABS: 1.0000 | ORAL_TABLET | Freq: Two times a day (BID) | ORAL | 0 refills | Status: AC | PRN

## 2024-02-28 ENCOUNTER — Encounter (HOSPITAL_COMMUNITY): Payer: Self-pay | Admitting: Occupational Therapy

## 2024-02-28 ENCOUNTER — Ambulatory Visit (HOSPITAL_COMMUNITY): Admitting: Occupational Therapy

## 2024-02-28 DIAGNOSIS — M25612 Stiffness of left shoulder, not elsewhere classified: Secondary | ICD-10-CM

## 2024-02-28 DIAGNOSIS — G8929 Other chronic pain: Secondary | ICD-10-CM

## 2024-02-28 DIAGNOSIS — R29898 Other symptoms and signs involving the musculoskeletal system: Secondary | ICD-10-CM

## 2024-02-28 DIAGNOSIS — M25512 Pain in left shoulder: Secondary | ICD-10-CM | POA: Diagnosis not present

## 2024-02-28 DIAGNOSIS — M25611 Stiffness of right shoulder, not elsewhere classified: Secondary | ICD-10-CM

## 2024-02-28 NOTE — Therapy (Signed)
 " OUTPATIENT OCCUPATIONAL THERAPY ORTHO TREATMENT  Patient Name: Kelli Hurley MRN: [id number] DOB:[birth year], 69 y.o., female Today's Date: 02/28/2024   END OF SESSION:  OT End of Session - 02/28/24 1154     Visit Number 3    Number of Visits 12    Date for Recertification  03/17/24    Authorization Type [employer] Medicare-dual complete    Authorization Time Period no auth required    Progress Note Due on Visit 10    OT Start Time 1036    OT Stop Time 1115    OT Time Calculation (min) 39 min    Activity Tolerance Patient tolerated treatment well    Behavior During Therapy WFL for tasks assessed/performed           Past Medical History:  Diagnosis Date   Anemia    Arthritis    Asthma    Cancer (HCC)    Claustrophobia 10/05/2014   Diabetes mellitus without complication (HCC)    Dyspnea    Hypertension    Leukopenia 08/03/2014   Normocytic hypochromic anemia 08/03/2014   Renal disorder    stage 3    Sleep apnea    Past Surgical History:  Procedure Laterality Date   ABDOMINAL HYSTERECTOMY     CARDIAC SURGERY     81 months old. States she had a leaky valve.    COLECTOMY WITH COLOSTOMY CREATION/HARTMANN PROCEDURE N/A 01/24/2023   Procedure: EXPLORATORY LAPAROTOMY;  Surgeon: Kallie Manuelita JAYSON, MD;  Location: [facility];  Service: General;  Laterality: N/A;   COLON RESECTION SIGMOID  01/24/2023   Procedure: SIGMOID COLECTOMY WITH END COLOSTOMY;  Surgeon: Kallie Manuelita JAYSON, MD;  Location: [facility];  Service: General;;   COLONOSCOPY  03/26/2009   Dr. Harvey; normal colon, small internal hemorrhoids.  Recommended repeat colonoscopy in 10 years.   COLONOSCOPY WITH PROPOFOL  N/A 11/25/2019   Procedure: COLONOSCOPY WITH PROPOFOL ;  Surgeon: Cindie Carlin POUR, DO;  Location: [facility];  Service: Endoscopy;  Laterality: N/A;  10:45am   IR PATIENT EVAL TECH 0-60 MINS  09/08/2022   IR URETERAL STENT PLACEMENT EXISTING ACCESS LEFT  11/24/2022   OTHER SURGICAL HISTORY     heart  surgery as infant to repair hole in heart   PORTACATH PLACEMENT Left 02/21/2019   Procedure: INSERTION PORT-A-CATH;  Surgeon: Kallie Manuelita JAYSON, MD;  Location: [facility];  Service: General;  Laterality: Left;   Patient Active Problem List   Diagnosis Date Noted   Colostomy complication (HCC) 02/19/2023   Irritant contact dermatitis associated with fecal stoma 02/07/2023   Colostomy care (HCC) 02/07/2023   Right ureteral injury 01/27/2023   Acute postoperative anemia due to expected blood loss 01/26/2023   Acute renal failure superimposed on stage 4 chronic kidney disease (HCC) 01/26/2023   Paroxysmal atrial flutter (HCC) 01/02/2023   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   Hydronephrosis due to obstruction of ureter 09/05/2022   Lactic acidosis 09/03/2022   Hyponatremia 09/03/2022   Hypoalbuminemia due to protein-calorie malnutrition 09/03/2022   Hypokalemia 09/03/2022   Mixed hyperlipidemia 09/03/2022   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   S/P hysterectomy with oophorectomy 08/16/2022   Vaginal discharge 08/16/2022   OSA (obstructive sleep apnea) 04/07/2021   Heart failure with preserved ejection fraction (HCC) 12/09/2020   CKD (chronic kidney disease) stage 3, GFR 30-59 ml/min (HCC) 07/28/2020   Anemia in neoplastic disease 07/28/2020   Essential hypertension 05/12/2020   Nocturnal hypoxemia 09/02/2019   Morbid obesity with BMI of 50.0-59.9, adult (HCC) 06/28/2019  SOB (shortness of breath) 06/27/2019   Goals of care, counseling/discussion 02/25/2019   H/O autologous stem cell transplant (HCC) 05/13/2015   Claustrophobia 10/05/2014   Multiple myeloma (HCC) 09/23/2014   IDA (iron deficiency anemia) 08/03/2014   Leukopenia 08/03/2014   Primary osteoarthritis of both knees 11/18/2013    PCP: Mariano Lindau, DO  REFERRING PROVIDER: Dr. Ozell Flake  ONSET DATE: ~1 year  REFERRING DIAG:  M75.01 (ICD-10-CM) - Adhesive capsulitis of right shoulder  M67.911 (ICD-10-CM) -  Unspecified disorder of synovium and tendon, right shoulder  M67.912 (ICD-10-CM) - Unspecified disorder of synovium and tendon, left shoulder  R20.0,R20.2 (ICD-10-CM) - Numbness and tingling of both upper extremities    THERAPY DIAG:  Chronic left shoulder pain  Chronic right shoulder pain  Other symptoms and signs involving the musculoskeletal system  Stiffness of right shoulder, not elsewhere classified  Stiffness of left shoulder, not elsewhere classified  Rationale for Evaluation and Treatment: Rehabilitation  SUBJECTIVE:   SUBJECTIVE STATEMENT: S: It hasn't changed much Pt accompanied by: self  PERTINENT HISTORY: Pt reports chronic shoulder pain and weakness that has been present for about a year, maybe more. Pt has received injections twice without relief. Pt with PMH of CVA.   PRECAUTIONS: Fall  WEIGHT BEARING RESTRICTIONS: No  PAIN:  Are you having pain? Yes: NPRS scale: 8/10  Pain location: bilateral shoulders Pain description: aching Aggravating factors: rainy weather, reaching Relieving factors: resting by her sides  FALLS: Has patient fallen in last 6 months? No  PLOF: Needs assistance with ADLs and Needs assistance with gait  PATIENT GOALS: To have less pain and more mobility  NEXT MD VISIT: none scheduled  OBJECTIVE:   HAND DOMINANCE: Right  ADLs: Overall ADLs: Pt reports difficulty with reaching overhead, in front of her, and behind her back. Pt has difficulty with dressing, bathing, grooming-fixing hair. Pt is unable to lift and hold weighted objects. Pt has some difficulty with pushing up on her wheelchair to adjust her position in her wheelchair and when holding/pushing her walker.   FUNCTIONAL OUTCOME MEASURES: Quick Dash:  QUICK DASH  Please rate your ability do the following activities in the last week by selecting the number below the appropriate response.   Activities Rating  Open a tight or new jar.  4 = Severe difficulty  Do  heavy household chores (e.g., wash walls, floors). 4 = Severe difficulty  Carry a shopping bag or briefcase 5 = Unable  Wash your back. 3 = Moderate difficulty  Use a knife to cut food. 1 = No difficulty   Recreational activities in which you take some force or impact through your arm, shoulder or hand (e.g., golf, hammering, tennis, etc.). 4 = Severe difficulty  During the past week, to what extent has your arm, shoulder or hand problem interfered with your normal social activities with family, friends, neighbors or groups?  4 = Quite a bit  During the past week, were you limited in your work or other regular daily activities as a result of your arm, shoulder or hand problem? 4 = Very limited  Rate the severity of the following symptoms in the last week: Arm, Shoulder, or hand pain. 5 = Extreme  Rate the severity of the following symptoms in the last week: Tingling (pins and needles) in your arm, shoulder or hand. 1 = none  During the past week, how much difficulty have you had sleeping because of the pain in your arm, shoulder or hand?  3 =  Moderate difficulty   (A QuickDASH score may not be calculated if there is greater than 1 missing item.)  Quick Dash Disability/Symptom Score: [(sum of 38 (n) responses/11 (n)] -1x 25 = 61.37  Minimally Clinically Important Difference (MCID): 15-20 points  (Franchignoni, F. et al. (2013). Minimally clinically important difference of the disabilities of the arm, shoulder, and hand outcome measures (DASH) and its shortened version (Quick DASH). Journal of Orthopaedic & Sports Physical Therapy, 44(1), 30-39)    UPPER EXTREMITY ROM:       Assessed in sitting, er/IR adducted  Active ROM Right eval Left eval  Shoulder flexion 82 86  Shoulder abduction 78 77  Shoulder internal rotation 90 90  Shoulder external rotation 10 0  (Blank rows = not tested)    UPPER EXTREMITY MMT:     Assessed in sitting, er/IR adducted  MMT Right eval Left eval   Shoulder flexion 3-/5 3-/5  Shoulder abduction 3-/5 3-/5  Shoulder internal rotation 3/5 3/5  Shoulder external rotation 3-/5 3-/5  (Blank rows = not tested)  HAND FUNCTION: Grip strength: Right: 70 lbs; Left: 55 lbs  SENSATION: WFL  EDEMA: None  COGNITION: Overall cognitive status: Within functional limits for tasks assessed  OBSERVATIONS: Max fascial restrictions in bilateral trapezius. Pt with crepitus noted in bilateral shoulders. Pt with more motion in the RUE passively, LUE with hard end feel and pain experienced.      TODAY'S TREATMENT:                                                                                                                              DATE:  02/28/24 -Manual Therapy: myofascial release and trigger point applied to biceps, trapezius, and scapular region in order to reduce fascial restrictions and pain, as well as improve ROM.  -P/ROM: sitting, BUE-flexion, abduction, horizontal abduction, 5 reps -AA/ROM: sitting-protraction, flexion, abduction,  -A/ROM: sitting- er, 10 reps -Therapy ball rolls: blue ball-flexion, horizontal abduction, 10 reps -Saebo ring tree: pt placing rings on second bar with RUE, removing with LUE. Reaching for or returning to OT in various planes -Pinch tree: Pt placing all yellow, green, red pins on vertical bar of pinch tree with right hand, removing with left hand. Moderate lean to each side.  -Ball roll on mat table at knee height, using basketball, flexion, 10 reps  02/18/24 -Manual Therapy: myofascial release and trigger point applied to biceps, trapezius, and scapular region in order to reduce fascial restrictions and pain, as well as improve ROM.  -AA/ROM: sitting - protraction, flexion, abduction, horizontal abduction, er/IR, x10 BUE -Scapular A/ROM: sitting-row, extension, elevation/depression, 10 reps -Table slides: flexion, abduction,er,  x10  Eval -Scapular A/ROM: sitting-row, extension, elevation/depression,  10 reps -Table slides: sitting-flexion, abduction 5 reps   PATIENT EDUCATION: Education details: reviewed HEP Person educated: Patient Education method: Explanation, Demonstration, and Handouts Education comprehension: verbalized understanding and returned demonstration  HOME EXERCISE PROGRAM: Eval: Table slides, scapular A/ROM 1/12: AA/ROM  GOALS: Goals reviewed with patient? Yes   SHORT TERM GOALS: Target date: 02/25/24  Pt will be provided with and educated on HEP to improve mobility in BUE required for use during ADL completion.   Goal status: IN PROGRESS  2.  Pt will increase BUE A/ROM by 10 degrees to improve ability to use BUE during dressing tasks with minimal compensatory techniques.   Goal status: IN PROGRESS    LONG TERM GOALS: Target date: 03/17/24  Pt will decrease pain in BUE to 5/10 or less to improve ability to sleep for 2+ consecutive hours without waking due to pain.   Goal status: IN PROGRESS  2.  Pt will decrease BUE fascial restrictions to min amounts or less to improve mobility required for functional reaching tasks.   Goal status: IN PROGRESS  3.  Pt will increase BUE A/ROM by 20 degrees to improve ability to use BUE when reaching overhead or behind back during dressing and bathing tasks.   Goal status: IN PROGRESS  4.  Pt will increase BUE strength to 4-/5 or greater to improve ability to use BUE when lifting items during meal preparation/housework/yardwork tasks.   Goal status: IN PROGRESS   ASSESSMENT:  CLINICAL IMPRESSION: Pt reports high pain due to the cold. Continued with manual techniques initially, pt tolerating moderate pressure. During AA/ROM pt achieving ROM to approximately 50% in flexion, 70% in abduction. Pt working on functional reaching to improve safety and independence with self-care at home, verbal cuing to sit straight and not lean too much in either direction. Pt reports same pain at end of session, no worse. Verbal cuing  for form and technique, rest breaks provided as needed.     PERFORMANCE DEFICITS: in functional skills including in functional skills including ADLs, IADLs, coordination, tone, ROM, strength, pain, fascial restrictions, muscle spasms, and UE functional use   PLAN:  OT FREQUENCY: 2x/week  OT DURATION: 6 weeks  PLANNED INTERVENTIONS: 97168 OT Re-evaluation, 97535 self care/ADL training, 02889 therapeutic exercise, 97530 therapeutic activity, 97112 neuromuscular re-education, 97140 manual therapy, 97014 electrical stimulation unattended, patient/family education, and DME and/or AE instructions  RECOMMENDED OTHER SERVICES: None at this time  CONSULTED AND AGREED WITH PLAN OF CARE: Patient  PLAN FOR NEXT SESSION: Follow up on HEP, manual techniques, supine ROM tasks   Sonny Cory, OTR/L  [phone] 02/28/2024, 11:55 AM   "

## 2024-03-03 ENCOUNTER — Ambulatory Visit (HOSPITAL_COMMUNITY): Admitting: Occupational Therapy

## 2024-03-06 ENCOUNTER — Ambulatory Visit (HOSPITAL_COMMUNITY): Admitting: Occupational Therapy

## 2024-03-10 ENCOUNTER — Ambulatory Visit (HOSPITAL_COMMUNITY): Admitting: Occupational Therapy

## 2024-03-11 ENCOUNTER — Inpatient Hospital Stay

## 2024-03-13 ENCOUNTER — Inpatient Hospital Stay: Attending: Hematology

## 2024-03-13 ENCOUNTER — Ambulatory Visit (HOSPITAL_COMMUNITY): Admitting: Occupational Therapy

## 2024-03-17 ENCOUNTER — Ambulatory Visit (HOSPITAL_COMMUNITY): Attending: Family Medicine | Admitting: Occupational Therapy

## 2024-03-18 ENCOUNTER — Inpatient Hospital Stay: Admitting: Oncology

## 2024-03-19 ENCOUNTER — Inpatient Hospital Stay

## 2024-03-19 ENCOUNTER — Ambulatory Visit: Admitting: Gastroenterology

## 2024-03-20 ENCOUNTER — Ambulatory Visit (HOSPITAL_COMMUNITY): Admitting: Occupational Therapy

## 2024-03-24 ENCOUNTER — Ambulatory Visit (HOSPITAL_COMMUNITY): Admitting: Occupational Therapy

## 2024-03-27 ENCOUNTER — Ambulatory Visit (HOSPITAL_COMMUNITY): Admitting: Occupational Therapy

## 2024-03-31 ENCOUNTER — Ambulatory Visit (HOSPITAL_COMMUNITY): Admitting: Occupational Therapy

## 2024-04-03 ENCOUNTER — Ambulatory Visit (HOSPITAL_COMMUNITY): Admitting: Occupational Therapy

## 2024-12-29 ENCOUNTER — Ambulatory Visit: Admitting: Urology
